# Patient Record
Sex: Female | Born: 1945 | Race: White | Hispanic: No | Marital: Married | Smoking: Never smoker
Health system: Southern US, Community
[De-identification: ages and names within clinical notes are randomized; demographics above are authoritative.]

## PROBLEM LIST (undated history)

## (undated) DIAGNOSIS — Z9989 Dependence on other enabling machines and devices: Secondary | ICD-10-CM

## (undated) DIAGNOSIS — I499 Cardiac arrhythmia, unspecified: Secondary | ICD-10-CM

## (undated) DIAGNOSIS — E119 Type 2 diabetes mellitus without complications: Secondary | ICD-10-CM

## (undated) DIAGNOSIS — K5730 Diverticulosis of large intestine without perforation or abscess without bleeding: Secondary | ICD-10-CM

## (undated) DIAGNOSIS — I10 Essential (primary) hypertension: Secondary | ICD-10-CM

## (undated) DIAGNOSIS — M719 Bursopathy, unspecified: Secondary | ICD-10-CM

## (undated) DIAGNOSIS — N39498 Other specified urinary incontinence: Secondary | ICD-10-CM

## (undated) DIAGNOSIS — E039 Hypothyroidism, unspecified: Secondary | ICD-10-CM

## (undated) DIAGNOSIS — R350 Frequency of micturition: Secondary | ICD-10-CM

## (undated) DIAGNOSIS — I5189 Other ill-defined heart diseases: Secondary | ICD-10-CM

## (undated) DIAGNOSIS — K644 Residual hemorrhoidal skin tags: Secondary | ICD-10-CM

## (undated) DIAGNOSIS — K219 Gastro-esophageal reflux disease without esophagitis: Secondary | ICD-10-CM

## (undated) DIAGNOSIS — E538 Deficiency of other specified B group vitamins: Secondary | ICD-10-CM

## (undated) DIAGNOSIS — I4891 Unspecified atrial fibrillation: Secondary | ICD-10-CM

## (undated) DIAGNOSIS — D126 Benign neoplasm of colon, unspecified: Secondary | ICD-10-CM

## (undated) DIAGNOSIS — I340 Nonrheumatic mitral (valve) insufficiency: Secondary | ICD-10-CM

## (undated) DIAGNOSIS — F3289 Other specified depressive episodes: Secondary | ICD-10-CM

## (undated) DIAGNOSIS — M1990 Unspecified osteoarthritis, unspecified site: Secondary | ICD-10-CM

## (undated) DIAGNOSIS — Z8719 Personal history of other diseases of the digestive system: Secondary | ICD-10-CM

## (undated) DIAGNOSIS — J45909 Unspecified asthma, uncomplicated: Secondary | ICD-10-CM

## (undated) DIAGNOSIS — M67919 Unspecified disorder of synovium and tendon, unspecified shoulder: Secondary | ICD-10-CM

## (undated) DIAGNOSIS — E669 Obesity, unspecified: Secondary | ICD-10-CM

## (undated) DIAGNOSIS — F329 Major depressive disorder, single episode, unspecified: Secondary | ICD-10-CM

## (undated) DIAGNOSIS — G4733 Obstructive sleep apnea (adult) (pediatric): Secondary | ICD-10-CM

## (undated) DIAGNOSIS — R079 Chest pain, unspecified: Secondary | ICD-10-CM

## (undated) DIAGNOSIS — R3915 Urgency of urination: Secondary | ICD-10-CM

## (undated) DIAGNOSIS — I4819 Other persistent atrial fibrillation: Secondary | ICD-10-CM

## (undated) DIAGNOSIS — E7800 Pure hypercholesterolemia, unspecified: Secondary | ICD-10-CM

## (undated) DIAGNOSIS — K449 Diaphragmatic hernia without obstruction or gangrene: Secondary | ICD-10-CM

## (undated) DIAGNOSIS — K5900 Constipation, unspecified: Secondary | ICD-10-CM

## (undated) DIAGNOSIS — D649 Anemia, unspecified: Secondary | ICD-10-CM

## (undated) DIAGNOSIS — F419 Anxiety disorder, unspecified: Secondary | ICD-10-CM

## (undated) DIAGNOSIS — D131 Benign neoplasm of stomach: Secondary | ICD-10-CM

## (undated) DIAGNOSIS — K648 Other hemorrhoids: Secondary | ICD-10-CM

## (undated) DIAGNOSIS — K635 Polyp of colon: Secondary | ICD-10-CM

## (undated) DIAGNOSIS — J189 Pneumonia, unspecified organism: Secondary | ICD-10-CM

## (undated) HISTORY — DX: Polyp of colon: K63.5

## (undated) HISTORY — DX: Nonrheumatic mitral (valve) insufficiency: I34.0

## (undated) HISTORY — DX: Other specified urinary incontinence: N39.498

## (undated) HISTORY — DX: Other hemorrhoids: K64.8

## (undated) HISTORY — PX: TONSILLECTOMY: SUR1361

## (undated) HISTORY — DX: Diaphragmatic hernia without obstruction or gangrene: K44.9

## (undated) HISTORY — PX: CATARACT EXTRACTION W/ INTRAOCULAR LENS  IMPLANT, BILATERAL: SHX1307

## (undated) HISTORY — DX: Deficiency of other specified B group vitamins: E53.8

## (undated) HISTORY — DX: Unspecified disorder of synovium and tendon, unspecified shoulder: M67.919

## (undated) HISTORY — DX: Residual hemorrhoidal skin tags: K64.4

## (undated) HISTORY — DX: Obesity, unspecified: E66.9

## (undated) HISTORY — DX: Benign neoplasm of colon, unspecified: D12.6

## (undated) HISTORY — PX: OTHER SURGICAL HISTORY: SHX169

## (undated) HISTORY — DX: Diverticulosis of large intestine without perforation or abscess without bleeding: K57.30

## (undated) HISTORY — DX: Other specified depressive episodes: F32.89

## (undated) HISTORY — DX: Pure hypercholesterolemia, unspecified: E78.00

## (undated) HISTORY — DX: Other persistent atrial fibrillation: I48.19

## (undated) HISTORY — DX: Essential (primary) hypertension: I10

## (undated) HISTORY — DX: Other ill-defined heart diseases: I51.89

## (undated) HISTORY — DX: Unspecified disorder of synovium and tendon, unspecified shoulder: M71.9

## (undated) HISTORY — PX: CARDIAC CATHETERIZATION: SHX172

## (undated) HISTORY — DX: Hypothyroidism, unspecified: E03.9

## (undated) HISTORY — DX: Anemia, unspecified: D64.9

## (undated) HISTORY — DX: Constipation, unspecified: K59.00

## (undated) HISTORY — PX: REDUCTION MAMMAPLASTY: SUR839

## (undated) HISTORY — DX: Unspecified atrial fibrillation: I48.91

## (undated) HISTORY — DX: Major depressive disorder, single episode, unspecified: F32.9

## (undated) HISTORY — PX: ABDOMINAL HYSTERECTOMY: SHX81

## (undated) HISTORY — DX: Benign neoplasm of stomach: D13.1

## (undated) HISTORY — PX: COLONOSCOPY: SHX174

## (undated) HISTORY — PX: LAPAROSCOPIC CHOLECYSTECTOMY: SUR755

## (undated) HISTORY — DX: Chest pain, unspecified: R07.9

---
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
## 1970-03-04 HISTORY — PX: ABDOMINAL HYSTERECTOMY: SHX81

## 1997-12-19 ENCOUNTER — Ambulatory Visit (HOSPITAL_COMMUNITY): Admission: RE | Admit: 1997-12-19 | Discharge: 1997-12-19 | Payer: Self-pay | Admitting: *Deleted

## 1999-01-09 ENCOUNTER — Ambulatory Visit (HOSPITAL_COMMUNITY): Admission: RE | Admit: 1999-01-09 | Discharge: 1999-01-09 | Payer: Self-pay | Admitting: *Deleted

## 1999-01-09 ENCOUNTER — Encounter: Payer: Self-pay | Admitting: *Deleted

## 1999-09-20 ENCOUNTER — Other Ambulatory Visit: Admission: RE | Admit: 1999-09-20 | Discharge: 1999-09-20 | Payer: Self-pay | Admitting: *Deleted

## 2000-01-22 ENCOUNTER — Ambulatory Visit (HOSPITAL_COMMUNITY): Admission: RE | Admit: 2000-01-22 | Discharge: 2000-01-22 | Payer: Self-pay | Admitting: *Deleted

## 2000-01-22 ENCOUNTER — Encounter: Payer: Self-pay | Admitting: *Deleted

## 2001-01-22 ENCOUNTER — Ambulatory Visit (HOSPITAL_COMMUNITY): Admission: RE | Admit: 2001-01-22 | Discharge: 2001-01-22 | Payer: Self-pay | Admitting: *Deleted

## 2001-01-22 ENCOUNTER — Encounter: Payer: Self-pay | Admitting: *Deleted

## 2001-07-14 ENCOUNTER — Emergency Department (HOSPITAL_COMMUNITY): Admission: EM | Admit: 2001-07-14 | Discharge: 2001-07-14 | Payer: Self-pay | Admitting: Emergency Medicine

## 2001-07-14 ENCOUNTER — Encounter: Payer: Self-pay | Admitting: Emergency Medicine

## 2002-04-13 ENCOUNTER — Encounter: Payer: Self-pay | Admitting: Family Medicine

## 2002-04-13 ENCOUNTER — Ambulatory Visit (HOSPITAL_COMMUNITY): Admission: RE | Admit: 2002-04-13 | Discharge: 2002-04-13 | Payer: Self-pay | Admitting: Family Medicine

## 2002-05-05 ENCOUNTER — Other Ambulatory Visit: Admission: RE | Admit: 2002-05-05 | Discharge: 2002-05-05 | Payer: Self-pay | Admitting: Obstetrics & Gynecology

## 2003-03-05 LAB — HM COLONOSCOPY

## 2003-06-10 ENCOUNTER — Ambulatory Visit (HOSPITAL_COMMUNITY): Admission: RE | Admit: 2003-06-10 | Discharge: 2003-06-10 | Payer: Self-pay | Admitting: Family Medicine

## 2004-07-24 ENCOUNTER — Ambulatory Visit (HOSPITAL_COMMUNITY): Admission: RE | Admit: 2004-07-24 | Discharge: 2004-07-24 | Payer: Self-pay | Admitting: Gynecology

## 2005-02-13 ENCOUNTER — Ambulatory Visit: Payer: Self-pay

## 2005-10-02 ENCOUNTER — Encounter: Payer: Self-pay | Admitting: Family Medicine

## 2005-10-02 LAB — CONVERTED CEMR LAB: Hgb A1c MFr Bld: 7.6 %

## 2005-10-08 ENCOUNTER — Ambulatory Visit (HOSPITAL_COMMUNITY): Admission: RE | Admit: 2005-10-08 | Discharge: 2005-10-08 | Payer: Self-pay | Admitting: Gynecology

## 2006-02-07 ENCOUNTER — Ambulatory Visit: Payer: Self-pay | Admitting: Gynecology

## 2006-03-04 ENCOUNTER — Encounter: Payer: Self-pay | Admitting: Family Medicine

## 2006-03-06 ENCOUNTER — Ambulatory Visit: Payer: Self-pay | Admitting: Obstetrics & Gynecology

## 2006-03-12 ENCOUNTER — Ambulatory Visit: Payer: Self-pay | Admitting: Family Medicine

## 2006-03-12 ENCOUNTER — Ambulatory Visit (HOSPITAL_COMMUNITY): Admission: RE | Admit: 2006-03-12 | Discharge: 2006-03-12 | Payer: Self-pay | Admitting: Gynecology

## 2006-03-24 ENCOUNTER — Ambulatory Visit: Payer: Self-pay | Admitting: Family Medicine

## 2006-03-27 ENCOUNTER — Ambulatory Visit: Payer: Self-pay | Admitting: Obstetrics & Gynecology

## 2006-04-04 ENCOUNTER — Encounter: Payer: Self-pay | Admitting: Family Medicine

## 2006-04-04 LAB — CONVERTED CEMR LAB: Hgb A1c MFr Bld: 9.1 %

## 2006-05-01 ENCOUNTER — Ambulatory Visit: Payer: Self-pay | Admitting: Family Medicine

## 2006-05-01 LAB — CONVERTED CEMR LAB
Bilirubin, Direct: 0.1 mg/dL (ref 0.0–0.3)
Chloride: 102 meq/L (ref 96–112)
Creatinine,U: 138.2 mg/dL
LDL Cholesterol: 136 mg/dL — ABNORMAL HIGH (ref 0–99)
Microalb Creat Ratio: 8 mg/g (ref 0.0–30.0)
Microalb, Ur: 1.1 mg/dL (ref 0.0–1.9)
Sodium: 137 meq/L (ref 135–145)
Total Bilirubin: 0.5 mg/dL (ref 0.3–1.2)
Total CHOL/HDL Ratio: 4.4
Total Protein: 6.7 g/dL (ref 6.0–8.3)
VLDL: 14 mg/dL (ref 0–40)

## 2006-05-06 ENCOUNTER — Ambulatory Visit: Payer: Self-pay | Admitting: Family Medicine

## 2006-06-07 ENCOUNTER — Ambulatory Visit: Payer: Self-pay | Admitting: Family Medicine

## 2006-06-11 ENCOUNTER — Encounter: Payer: Self-pay | Admitting: Family Medicine

## 2006-06-11 DIAGNOSIS — N39498 Other specified urinary incontinence: Secondary | ICD-10-CM | POA: Insufficient documentation

## 2006-06-11 DIAGNOSIS — K5730 Diverticulosis of large intestine without perforation or abscess without bleeding: Secondary | ICD-10-CM | POA: Insufficient documentation

## 2006-06-11 DIAGNOSIS — K648 Other hemorrhoids: Secondary | ICD-10-CM | POA: Insufficient documentation

## 2006-06-11 DIAGNOSIS — F325 Major depressive disorder, single episode, in full remission: Secondary | ICD-10-CM | POA: Insufficient documentation

## 2006-06-11 DIAGNOSIS — E7800 Pure hypercholesterolemia, unspecified: Secondary | ICD-10-CM | POA: Insufficient documentation

## 2006-06-11 DIAGNOSIS — F330 Major depressive disorder, recurrent, mild: Secondary | ICD-10-CM | POA: Insufficient documentation

## 2006-06-11 DIAGNOSIS — E1169 Type 2 diabetes mellitus with other specified complication: Secondary | ICD-10-CM | POA: Insufficient documentation

## 2006-08-04 ENCOUNTER — Ambulatory Visit: Payer: Self-pay | Admitting: Family Medicine

## 2006-08-05 LAB — CONVERTED CEMR LAB
AST: 21 units/L (ref 0–37)
Cholesterol: 227 mg/dL (ref 0–200)
Direct LDL: 155.4 mg/dL
HDL: 48.5 mg/dL (ref >39.0)
VLDL: 15 mg/dL (ref 0–40)

## 2006-10-31 ENCOUNTER — Ambulatory Visit: Payer: Self-pay | Admitting: Gynecology

## 2006-12-05 ENCOUNTER — Ambulatory Visit (HOSPITAL_COMMUNITY): Admission: RE | Admit: 2006-12-05 | Discharge: 2006-12-05 | Payer: Self-pay | Admitting: Gynecology

## 2006-12-09 ENCOUNTER — Ambulatory Visit: Payer: Self-pay | Admitting: Family Medicine

## 2006-12-09 LAB — CONVERTED CEMR LAB
Chloride: 105 meq/L (ref 96–112)
Cholesterol: 215 mg/dL (ref 0–200)
HDL: 43.2 mg/dL (ref >39.0)
Hgb A1c MFr Bld: 7.5 % — ABNORMAL HIGH (ref 4.6–6.0)
VLDL: 9 mg/dL (ref 0–40)

## 2006-12-29 ENCOUNTER — Ambulatory Visit: Payer: Self-pay | Admitting: Family Medicine

## 2006-12-30 ENCOUNTER — Ambulatory Visit: Payer: Self-pay | Admitting: Cardiology

## 2007-03-09 ENCOUNTER — Ambulatory Visit: Payer: Self-pay | Admitting: Family Medicine

## 2007-03-11 ENCOUNTER — Ambulatory Visit: Payer: Self-pay | Admitting: Family Medicine

## 2007-03-11 LAB — CONVERTED CEMR LAB
Cholesterol, target level: 200 mg/dL
HDL goal, serum: 40 mg/dL
LDL Goal: 100 mg/dL

## 2007-03-12 LAB — CONVERTED CEMR LAB
ALT: 24 units/L (ref 0–35)
AST: 21 units/L (ref 0–37)
HDL: 49 mg/dL (ref >39)

## 2007-04-01 ENCOUNTER — Encounter: Payer: Self-pay | Admitting: Family Medicine

## 2007-04-01 ENCOUNTER — Telehealth (INDEPENDENT_AMBULATORY_CARE_PROVIDER_SITE_OTHER): Payer: Self-pay | Admitting: *Deleted

## 2007-04-21 ENCOUNTER — Ambulatory Visit: Payer: Self-pay | Admitting: Family Medicine

## 2007-06-08 ENCOUNTER — Ambulatory Visit: Payer: Self-pay | Admitting: Family Medicine

## 2007-06-17 LAB — CONVERTED CEMR LAB
Cholesterol: 195 mg/dL (ref 0–200)
HDL: 52 mg/dL (ref >39)
Hgb A1c MFr Bld: 7.6 % — ABNORMAL HIGH (ref 4.6–6.1)
LDL Cholesterol: 128 mg/dL — ABNORMAL HIGH (ref 0–99)
Total CHOL/HDL Ratio: 3.8
Triglycerides: 76 mg/dL (ref <150)

## 2007-06-23 ENCOUNTER — Ambulatory Visit: Payer: Self-pay | Admitting: Family Medicine

## 2007-07-16 ENCOUNTER — Ambulatory Visit: Payer: Self-pay | Admitting: Gynecology

## 2007-09-18 ENCOUNTER — Ambulatory Visit: Payer: Self-pay | Admitting: Family Medicine

## 2007-09-23 ENCOUNTER — Ambulatory Visit: Payer: Self-pay | Admitting: Family Medicine

## 2007-09-23 LAB — CONVERTED CEMR LAB
ALT: 18 units/L (ref 0–35)
AST: 17 units/L (ref 0–37)
Albumin: 3.8 g/dL (ref 3.5–5.2)
Alkaline Phosphatase: 61 units/L (ref 39–117)
Bilirubin, Direct: <0.1 mg/dL (ref 0.0–0.3)
CO2: 25 meq/L (ref 19–32)
Chloride: 106 meq/L (ref 96–112)
Cholesterol: 185 mg/dL (ref 0–200)
Total Protein: 6.5 g/dL (ref 6.0–8.3)
Triglycerides: 85 mg/dL (ref <150)

## 2007-10-16 ENCOUNTER — Encounter: Payer: Self-pay | Admitting: Family Medicine

## 2007-10-22 ENCOUNTER — Telehealth: Payer: Self-pay | Admitting: Family Medicine

## 2007-10-26 ENCOUNTER — Telehealth: Payer: Self-pay | Admitting: Family Medicine

## 2007-10-30 ENCOUNTER — Encounter: Payer: Self-pay | Admitting: Family Medicine

## 2007-12-16 ENCOUNTER — Encounter: Payer: Self-pay | Admitting: Family Medicine

## 2007-12-22 ENCOUNTER — Ambulatory Visit: Payer: Self-pay | Admitting: Family Medicine

## 2007-12-24 LAB — CONVERTED CEMR LAB
Cholesterol: 198 mg/dL (ref 0–200)
HDL: 42.5 mg/dL (ref >39.0)
LDL Cholesterol: 136 mg/dL — ABNORMAL HIGH (ref 0–99)
VLDL: 19 mg/dL (ref 0–40)

## 2007-12-25 ENCOUNTER — Ambulatory Visit (HOSPITAL_COMMUNITY): Admission: RE | Admit: 2007-12-25 | Discharge: 2007-12-25 | Payer: Self-pay | Admitting: Gynecology

## 2007-12-28 ENCOUNTER — Ambulatory Visit: Payer: Self-pay | Admitting: Family Medicine

## 2008-02-19 ENCOUNTER — Encounter: Payer: Self-pay | Admitting: Family Medicine

## 2008-02-23 ENCOUNTER — Encounter: Payer: Self-pay | Admitting: Family Medicine

## 2008-03-24 ENCOUNTER — Encounter: Payer: Self-pay | Admitting: Family Medicine

## 2008-04-12 ENCOUNTER — Ambulatory Visit: Payer: Self-pay | Admitting: Family Medicine

## 2008-04-14 ENCOUNTER — Ambulatory Visit: Payer: Self-pay | Admitting: Family Medicine

## 2008-04-28 ENCOUNTER — Ambulatory Visit: Payer: Self-pay | Admitting: Family Medicine

## 2008-04-28 ENCOUNTER — Encounter (INDEPENDENT_AMBULATORY_CARE_PROVIDER_SITE_OTHER): Payer: Self-pay | Admitting: *Deleted

## 2008-05-03 LAB — CONVERTED CEMR LAB
ALT: 14 units/L (ref 0–35)
LDL Cholesterol: 73 mg/dL (ref 0–99)
Total CHOL/HDL Ratio: 2.5
VLDL: 9 mg/dL (ref 0–40)

## 2008-06-27 ENCOUNTER — Encounter (INDEPENDENT_AMBULATORY_CARE_PROVIDER_SITE_OTHER): Payer: Self-pay | Admitting: *Deleted

## 2008-07-06 ENCOUNTER — Ambulatory Visit: Payer: Self-pay | Admitting: Family Medicine

## 2008-07-22 ENCOUNTER — Ambulatory Visit: Payer: Self-pay | Admitting: Family Medicine

## 2008-08-19 ENCOUNTER — Telehealth: Payer: Self-pay | Admitting: Family Medicine

## 2008-10-19 ENCOUNTER — Ambulatory Visit: Payer: Self-pay | Admitting: Family Medicine

## 2008-10-19 LAB — CONVERTED CEMR LAB
BUN: 17 mg/dL (ref 6–23)
Bilirubin, Direct: 0.1 mg/dL (ref 0.0–0.3)
CO2: 32 meq/L (ref 19–32)
Calcium: 8.9 mg/dL (ref 8.4–10.5)
Chloride: 111 meq/L (ref 96–112)
Cholesterol: 166 mg/dL (ref 0–200)
Creatinine, Ser: 0.8 mg/dL (ref 0.4–1.2)
GFR calc non Af Amer: 77.36 mL/min (ref >60)
HDL: 45.6 mg/dL (ref >39.00)
Hgb A1c MFr Bld: 7 % — ABNORMAL HIGH (ref 4.6–6.5)
Potassium: 4.6 meq/L (ref 3.5–5.1)
Total Protein: 6.6 g/dL (ref 6.0–8.3)
Triglycerides: 56 mg/dL (ref 0.0–149.0)
VLDL: 11.2 mg/dL (ref 0.0–40.0)

## 2008-10-25 ENCOUNTER — Ambulatory Visit: Payer: Self-pay | Admitting: Family Medicine

## 2008-10-25 DIAGNOSIS — G4730 Sleep apnea, unspecified: Secondary | ICD-10-CM

## 2008-12-02 LAB — HM DIABETES EYE EXAM: HM Diabetic Eye Exam: NORMAL

## 2008-12-07 ENCOUNTER — Encounter: Admission: RE | Admit: 2008-12-07 | Discharge: 2008-12-07 | Payer: Self-pay | Admitting: Cardiology

## 2008-12-07 ENCOUNTER — Encounter: Payer: Self-pay | Admitting: Family Medicine

## 2008-12-13 ENCOUNTER — Ambulatory Visit (HOSPITAL_COMMUNITY): Admission: RE | Admit: 2008-12-13 | Discharge: 2008-12-13 | Payer: Self-pay | Admitting: Cardiology

## 2008-12-14 ENCOUNTER — Encounter: Payer: Self-pay | Admitting: Family Medicine

## 2009-01-06 ENCOUNTER — Ambulatory Visit (HOSPITAL_COMMUNITY): Admission: RE | Admit: 2009-01-06 | Discharge: 2009-01-06 | Payer: Self-pay | Admitting: Family Medicine

## 2009-01-18 ENCOUNTER — Ambulatory Visit: Payer: Self-pay | Admitting: Family Medicine

## 2009-01-30 ENCOUNTER — Ambulatory Visit: Payer: Self-pay | Admitting: Family Medicine

## 2009-01-31 LAB — CONVERTED CEMR LAB
ALT: 14 units/L (ref 0–35)
AST: 15 units/L (ref 0–37)
Albumin: 3.7 g/dL (ref 3.5–5.2)
Alkaline Phosphatase: 48 units/L (ref 39–117)
CO2: 32 meq/L (ref 19–32)
Creatinine, Ser: 0.8 mg/dL (ref 0.4–1.2)
HDL: 43.4 mg/dL (ref >39.00)
LDL Cholesterol: 97 mg/dL (ref 0–99)
Potassium: 4.7 meq/L (ref 3.5–5.1)
Total CHOL/HDL Ratio: 4
Total Protein: 6.7 g/dL (ref 6.0–8.3)
Triglycerides: 74 mg/dL (ref 0.0–149.0)
VLDL: 14.8 mg/dL (ref 0.0–40.0)

## 2009-02-03 ENCOUNTER — Ambulatory Visit: Payer: Self-pay | Admitting: Family Medicine

## 2009-02-13 ENCOUNTER — Ambulatory Visit: Payer: Self-pay | Admitting: Family Medicine

## 2009-02-13 ENCOUNTER — Telehealth: Payer: Self-pay | Admitting: Family Medicine

## 2009-04-19 ENCOUNTER — Ambulatory Visit: Payer: Self-pay | Admitting: Family Medicine

## 2009-04-20 LAB — CONVERTED CEMR LAB
ALT: 13 units/L (ref 0–35)
Alkaline Phosphatase: 52 units/L (ref 39–117)
Calcium: 9.4 mg/dL (ref 8.4–10.5)
Cholesterol: 169 mg/dL (ref 0–200)
Glucose, Bld: 127 mg/dL — ABNORMAL HIGH (ref 70–99)
HDL: 44 mg/dL (ref >39)
Indirect Bilirubin: 0.2 mg/dL (ref 0.0–0.9)
Potassium: 4.7 meq/L (ref 3.5–5.3)
Total CHOL/HDL Ratio: 3.8
VLDL: 16 mg/dL (ref 0–40)

## 2009-04-28 ENCOUNTER — Ambulatory Visit: Payer: Self-pay | Admitting: Family Medicine

## 2009-05-02 LAB — CONVERTED CEMR LAB
Pap Smear: NORMAL
Pap Smear: NORMAL

## 2009-05-16 ENCOUNTER — Encounter: Payer: Self-pay | Admitting: Family Medicine

## 2009-06-08 ENCOUNTER — Ambulatory Visit: Payer: Self-pay | Admitting: Obstetrics & Gynecology

## 2009-06-20 ENCOUNTER — Encounter: Payer: Self-pay | Admitting: Family Medicine

## 2009-07-10 ENCOUNTER — Ambulatory Visit: Payer: Self-pay | Admitting: Family Medicine

## 2009-07-13 LAB — CONVERTED CEMR LAB
HDL: 42 mg/dL (ref >39)
Hgb A1c MFr Bld: 6.8 % — ABNORMAL HIGH (ref <5.7)
VLDL: 13 mg/dL (ref 0–40)

## 2009-07-14 ENCOUNTER — Ambulatory Visit: Payer: Self-pay | Admitting: Family Medicine

## 2009-08-25 ENCOUNTER — Encounter: Payer: Self-pay | Admitting: Family Medicine

## 2009-09-06 ENCOUNTER — Encounter (INDEPENDENT_AMBULATORY_CARE_PROVIDER_SITE_OTHER): Payer: Self-pay | Admitting: *Deleted

## 2009-09-06 LAB — CONVERTED CEMR LAB
Cholesterol: 153 mg/dL
Creatinine, Ser: 0.9 mg/dL
HDL: 42 mg/dL
Hgb A1c MFr Bld: 6.8 %

## 2009-09-26 ENCOUNTER — Ambulatory Visit: Payer: Self-pay | Admitting: Family Medicine

## 2009-09-29 LAB — CONVERTED CEMR LAB
ALT: 13 units/L (ref 0–35)
AST: 14 units/L (ref 0–37)
Alkaline Phosphatase: 60 units/L (ref 39–117)
BUN: 14 mg/dL (ref 6–23)
CO2: 26 meq/L (ref 19–32)
Chloride: 104 meq/L (ref 96–112)
Creatinine, Ser: 0.97 mg/dL (ref 0.40–1.20)
Creatinine, Urine: 156.2 mg/dL
Hgb A1c MFr Bld: 7.1 % — ABNORMAL HIGH (ref <5.7)
Potassium: 4.8 meq/L (ref 3.5–5.3)
Total Bilirubin: 0.4 mg/dL (ref 0.3–1.2)
Total Protein: 6.7 g/dL (ref 6.0–8.3)

## 2009-10-02 ENCOUNTER — Ambulatory Visit: Payer: Self-pay | Admitting: Family Medicine

## 2009-11-28 ENCOUNTER — Ambulatory Visit: Payer: Self-pay | Admitting: Family Medicine

## 2009-12-05 ENCOUNTER — Ambulatory Visit: Payer: Self-pay | Admitting: Family Medicine

## 2009-12-06 LAB — CONVERTED CEMR LAB
ALT: 18 units/L (ref 0–35)
Alkaline Phosphatase: 65 units/L (ref 39–117)
CO2: 28 meq/L (ref 19–32)
Calcium: 9.7 mg/dL (ref 8.4–10.5)
Chloride: 105 meq/L (ref 96–112)
HDL: 45.3 mg/dL (ref >39.00)
Hgb A1c MFr Bld: 7.7 % — ABNORMAL HIGH (ref 4.6–6.5)
Sodium: 140 meq/L (ref 135–145)
Total Bilirubin: 0.3 mg/dL (ref 0.3–1.2)
Total CHOL/HDL Ratio: 4
Total Protein: 6.7 g/dL (ref 6.0–8.3)
VLDL: 18.6 mg/dL (ref 0.0–40.0)

## 2009-12-12 ENCOUNTER — Ambulatory Visit: Payer: Self-pay | Admitting: Family Medicine

## 2009-12-13 LAB — CONVERTED CEMR LAB
BUN: 16 mg/dL (ref 6–23)
Calcium: 10 mg/dL (ref 8.4–10.5)
Chloride: 103 meq/L (ref 96–112)
Creatinine, Ser: 0.9 mg/dL (ref 0.40–1.20)
Glucose, Bld: 180 mg/dL — ABNORMAL HIGH (ref 70–99)
Sodium: 139 meq/L (ref 135–145)

## 2010-01-09 ENCOUNTER — Encounter (INDEPENDENT_AMBULATORY_CARE_PROVIDER_SITE_OTHER): Payer: Self-pay | Admitting: *Deleted

## 2010-01-09 ENCOUNTER — Ambulatory Visit: Payer: Self-pay | Admitting: Family Medicine

## 2010-01-09 LAB — CONVERTED CEMR LAB: Glucose, Bld: 125 mg/dL

## 2010-01-12 ENCOUNTER — Ambulatory Visit: Payer: Self-pay | Admitting: Internal Medicine

## 2010-01-12 LAB — CONVERTED CEMR LAB
Specific Gravity, Urine: 1.025
pH: 5

## 2010-01-13 ENCOUNTER — Encounter: Payer: Self-pay | Admitting: Family Medicine

## 2010-01-17 ENCOUNTER — Encounter (INDEPENDENT_AMBULATORY_CARE_PROVIDER_SITE_OTHER): Payer: Self-pay | Admitting: *Deleted

## 2010-01-23 ENCOUNTER — Encounter (INDEPENDENT_AMBULATORY_CARE_PROVIDER_SITE_OTHER): Payer: Self-pay | Admitting: *Deleted

## 2010-01-25 ENCOUNTER — Encounter (INDEPENDENT_AMBULATORY_CARE_PROVIDER_SITE_OTHER): Payer: Self-pay | Admitting: *Deleted

## 2010-01-28 ENCOUNTER — Encounter (INDEPENDENT_AMBULATORY_CARE_PROVIDER_SITE_OTHER): Payer: Self-pay | Admitting: *Deleted

## 2010-02-05 ENCOUNTER — Encounter (INDEPENDENT_AMBULATORY_CARE_PROVIDER_SITE_OTHER): Payer: Self-pay | Admitting: *Deleted

## 2010-02-08 ENCOUNTER — Ambulatory Visit (HOSPITAL_COMMUNITY)
Admission: RE | Admit: 2010-02-08 | Discharge: 2010-02-08 | Payer: Self-pay | Source: Home / Self Care | Attending: Family Medicine | Admitting: Family Medicine

## 2010-02-08 LAB — HM MAMMOGRAPHY: HM Mammogram: NEGATIVE

## 2010-02-12 ENCOUNTER — Ambulatory Visit: Payer: Self-pay | Admitting: Internal Medicine

## 2010-02-12 LAB — CONVERTED CEMR LAB
Bilirubin Urine: NEGATIVE
Glucose, Urine, Semiquant: NEGATIVE
Specific Gravity, Urine: 1.025
pH: 6

## 2010-02-13 ENCOUNTER — Encounter: Payer: Self-pay | Admitting: Family Medicine

## 2010-02-27 ENCOUNTER — Encounter: Payer: Self-pay | Admitting: Family Medicine

## 2010-03-19 ENCOUNTER — Encounter: Payer: Self-pay | Admitting: Family Medicine

## 2010-03-26 ENCOUNTER — Encounter: Payer: Self-pay | Admitting: Family Medicine

## 2010-04-02 ENCOUNTER — Ambulatory Visit
Admission: RE | Admit: 2010-04-02 | Discharge: 2010-04-02 | Payer: Self-pay | Source: Home / Self Care | Attending: Family Medicine | Admitting: Family Medicine

## 2010-04-02 ENCOUNTER — Encounter: Payer: Self-pay | Admitting: Family Medicine

## 2010-04-03 LAB — CONVERTED CEMR LAB
AST: 14 units/L (ref 0–37)
Alkaline Phosphatase: 59 units/L (ref 39–117)
CO2: 27 meq/L (ref 19–32)
Chloride: 100 meq/L (ref 96–112)
HDL: 38 mg/dL — ABNORMAL LOW (ref >39)
LDL Cholesterol: 150 mg/dL — ABNORMAL HIGH (ref 0–99)
Sodium: 135 meq/L (ref 135–145)
Total Bilirubin: 0.4 mg/dL (ref 0.3–1.2)
Total CHOL/HDL Ratio: 5.3
Triglycerides: 76 mg/dL (ref <150)

## 2010-04-05 NOTE — Progress Notes (Signed)
Summary: List of Blood Sugars taken at home  List of Blood Sugars taken at home   Imported By: Maryln Gottron 03/13/2010 14:21:40  _____________________________________________________________________  External Attachment:    Type:   Image     Comment:   External Document

## 2010-04-05 NOTE — Letter (Signed)
Summary: [facility]   Imported By: Lanelle Bal 07/19/2009 12:43:38  _____________________________________________________________________  External Attachment:    Type:   Image     Comment:   External Document

## 2010-04-05 NOTE — Assessment & Plan Note (Signed)
Summary: ?UTI/CLE   Vital Signs:  Patient profile:   65 year old female Weight:      212.75 pounds Temp:     98.1 degrees F oral Pulse rate:   76 / minute Pulse rhythm:   regular BP sitting:   110 / 70  (left arm) Cuff size:   large  Vitals Entered By: Selena Batten Dance CMA Duncan Dull) (February 12, 2010 9:21 AM) CC: ? UTI   History of Present Illness: CC: ?UTI  3d h/o UTI sxs.  Dysuria, frequency, urgency.  Achiness.  + Chills.  + abd and back pain, mainly abd pain.    Slept well last night.  Taking advil and plenty of water.  No fevers.  1 mo ago treated for UTI >100k Ecoli with 7 d course cipro.  completed course and resolved.  Current Medications (verified): 1)  Zetia 10 Mg  Tabs (Ezetimibe) .... Take 1 Tab By Mouth At Bedtime 2)  Lexapro 10 Mg  Tabs (Escitalopram Oxalate) .... Take 1 Tablet By Mouth Two Times A Day 3)  Calcium 500/d 500-200 Mg-Unit  Tabs (Calcium Carbonate-Vitamin D) .... Take 1 Tablet By Mouth Once A Day 4)  Bayer Low Strength 81 Mg  Tbec (Aspirin) .... Take 1 Tablet By Mouth Once A Day 5)  Zantac 150 Maximum Strength 150 Mg Tabs (Ranitidine Hcl) .... Take 1 By Mouth Once A Day 6)  Metformin Hcl 500 Mg Tabs (Metformin Hcl) .... Take 2 Tablets  By Mouth Once Daily 7)  Multivitamins   Tabs (Multiple Vitamin) .... Take 1 Tablet By Mouth Once A Day 8)  Accu-Chek Active   Strp (Glucose Blood) .... Test Blood Sugar Two Times A Day 9)  Januvia 100 Mg Tabs (Sitagliptin Phosphate) .... One At Bedtime 10)  Pravachol 20 Mg Tabs (Pravastatin Sodium) .[name].. 1 Tab By Mouth Daily 11)  Lisinopril 10 Mg Tabs (Lisinopril) .... Take 1 Tablet By Mouth Once A Day 12)  Alprazolam 0.25 Mg Tabs (Alprazolam) .[name].. 1 Tab By Mouth Daily As Needed Anxiety 13)  Lantus Solostar 100 Unit/ml Soln (Insulin Glargine) .[name].. 12 Units Daily.Marland Kitchentitrate Up Insulin Every 2 Days Until Fasting Blood Sugars Are <120 14)  Pen Needles For Solostar .... Size of Choice  Administer Daily  Dx 250.00  Allergies: 1)  !  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 FAST FOOD, SALADS, LEAN MEAT Never Smoked Alcohol use-no Drug use-no  Review of Systems       per HPI  Physical Exam  General:  Overwewight female in NAD Lungs:  Normal respiratory effort, chest expands symmetrically. Lungs are clear to auscultation, no crackles or wheezes. Heart:  Normal rate and regular rhythm. S1 and S2 normal without gallop, murmur, click, rub or other extra sounds. Abdomen:  Bowel sounds positive,abdomen soft and without masses, organomegaly or hernias noted.  very mild CVA tenderness.  + lower back pain.  + bilateral lower quadrant pressure, + suprapubic discomfort/pain. Pulses:  2+ rad pulses Extremities:  no pedal edema   Impression & Recommendations:  Problem # 1:  ACUTE CYSTITIS (ICD-595.0)  complicatd by recent abx course.  treat with 7 day course cipro twice daily.  red flags to return discussed.  Ucx sent.  The following medications were removed from the medication list:    Detrol La 4 Mg Xr24h-cap (Tolterodine tartrate) .[name]... Take 1 tablet by mouth once a day    Ciprofloxacin Hcl 500 Mg Tabs (Ciprofloxacin hcl) .[name]... Take one twice daily x 7 days Her updated medication list for this problem includes:    Ciprofloxacin Hcl 500 Mg Tabs (Ciprofloxacin hcl) .[name]... Take one twice daily for 7 days  Orders: UA Dipstick W/  Micro (manual) (78295) Specimen Handling (99000) T-Culture, Urine ([id number])  Complete Medication List: 1)  Zetia 10 Mg Tabs (Ezetimibe) .... Take 1 tab by mouth at bedtime 2)  Lexapro 10 Mg Tabs (Escitalopram oxalate) .... Take 1 tablet by mouth two times a day 3)  Calcium 500/d 500-200 Mg-unit Tabs (Calcium carbonate-vitamin d) .... Take 1 tablet by mouth once a day 4)  Bayer Low Strength 81 Mg Tbec (Aspirin) .... Take 1 tablet by mouth once a day 5)  Zantac 150 Maximum Strength 150 Mg Tabs (Ranitidine hcl) .... Take 1 by mouth once a day 6)  Metformin Hcl 500 Mg Tabs (Metformin hcl) .... Take 2 tablets  by mouth once daily 7)  Multivitamins Tabs (Multiple vitamin) .... Take 1 tablet by mouth once a day 8)  Accu-chek Active Strp (Glucose blood) .... Test blood sugar two times a day 9)  Januvia 100 Mg Tabs (Sitagliptin phosphate) .... One at bedtime 10)  Pravachol 20 Mg Tabs (Pravastatin sodium) .[name].. 1 tab by mouth daily 11)  Lisinopril 10 Mg Tabs (Lisinopril) .... Take 1 tablet by mouth once a day 12)  Alprazolam 0.25 Mg Tabs (Alprazolam) .[name].. 1 tab by mouth daily as needed anxiety 13)  Lantus Solostar 100 Unit/ml Soln (Insulin glargine) .[name].. 12 units daily.Marland Kitchentitrate up insulin every 2 days until fasting blood sugars are <120 14)  Pen Needles For Solostar  .... Size of choice  administer daily  dx 250.00 15)  Ciprofloxacin Hcl 500 Mg Tabs (Ciprofloxacin hcl) .... Take one twice daily for 7 days  Patient Instructions: 1)  Looks like another infection. 2)  Treat with cipro x 7 days again, twice daily. 3)  Push fluids and plenty of rest.  Tylenol for pain as needed. 4)  Call clinic with questions. 5)  Let us know if not improving, or if any more fevers/chills, or nausea/vomiting. Prescriptions: CIPROFLOXACIN HCL 500 MG TABS (CIPROFLOXACIN HCL) take one twice daily for 7 days  #14 x 0   Entered and Authorized by:   Eustaquio Boyden  MD   Signed by:   Eustaquio Boyden  MD on 02/12/2010    Method used:   Electronically to        [facility]* (retail)       [address]       [place], [place]  [address]       Ph: [phone]       Fax: [phone]   RxID:   [id number]    Orders Added: 1)  Est. Patient Level III [64332] 2)  UA Dipstick W/ Micro (manual) [81000] 3)  Specimen Handling [99000] 4)  T-Culture, Urine [[id number]]    Current Allergies (reviewed today): ! PRAVACHOL * CORTISONE (HYDROCORTISONE)  Laboratory Results   Urine Tests  Date/Time Received: February 12, 2010 9:23 AM  Date/Time Reported: February 12, 2010 9:23 AM   Routine Urinalysis  Color: lt. yellow Appearance: Cloudy Glucose: negative   (Normal Range: Negative) Bilirubin: negative   (Normal Range: Negative) Ketone: negative   (Normal Range: Negative) Spec. Gravity: 1.025   (Normal Range: 1.003-1.035) Blood: large   (Normal Range: Negative) pH: 6.0   (Normal Range: 5.0-8.0) Protein: 100   (Normal Range: Negative) Urobilinogen: 0.2   (Normal Range: 0-1) Nitrite: positive   (Normal Range: Negative) Leukocyte Esterace: large   (Normal Range: Negative)  Urine Microscopic WBC/HPF: TNTC RBC/HPF: 3-5 Bacteria/HPF: 1+ rods Mucous/HPF: no Epithelial/HPF: rare Crystals/HPF: no Casts/LPF: no Yeast/HPF: no    Comments: read by ............Eustaquio Boyden  MD  February 12, 2010 9:40 AM  UCx sent

## 2010-04-05 NOTE — Letter (Signed)
Summary: [facility] - Endocrine Follow-up  [facility] - Endocrine Follow-up   Imported By: Maryln Gottron 09/06/2009 14:32:49  _____________________________________________________________________  External Attachment:    Type:   Image     Comment:   External Document  Appended Document: [facility] - Endocrine Follow-up Heather-please enter..lipids, Hg, TSH, creatinine, A1C and  AST/ALT  if present into EMR.  Thanks.   Appended Document: [facility] - Endocrine Follow-up Done.Consuello Masse CMA

## 2010-04-05 NOTE — Assessment & Plan Note (Signed)
Summary: 3 m f/u dlo   Vital Signs:  Patient profile:   65 year old female Height:      65.75 inches Weight:      204.0 pounds BMI:     33.30 Temp:     97.7 degrees F oral Pulse rate:   76 / minute Pulse rhythm:   regular BP sitting:   110 / 72  (left arm) Cuff size:   large  Vitals Entered By: Benny Lennert CMA Duncan Dull) (December 12, 2009 8:42 AM)  History of Present Illness: Chief complaint 3 month follow up   DM, worsened control: on metformin, januvia and glucotrol  CBGs at home: FBS 142-182,  2 hours after meals ..not checking Increase in stress...mild depression..but anxiety intermittant, jittrery, difficulty sleeping. Checking 1-2  times daily. Hs lost 3 lbs more pounds weight loss.  Continuing to exercise.   BP at goal <130/80 on lisinopril 10 mg daily.  High potassium.Marland Kitcheneating daily bannana peanut butter sandwiches..has stopped since 10/4  Diabetes Management History:      She is (or has been) enrolled in the "Diabetic Education Program".  She is checking home blood sugars.  She says that she is exercising.  Type of exercise includes: walking.  Duration of exercise is estimated to be 30 min.  She is doing this 2 times per week.    Lipid Management History:      Positive NCEP/ATP III risk factors include female age 2 years old or older and diabetes.  Negative NCEP/ATP III risk factors include non-tobacco-user status.        Her compliance with the TLC diet is fair.  Adjunctive measures started by the patient include fiber, omega-3 supplements, limit alcohol consumpton, and weight reduction.  She expresses no side effects from her lipid-lowering medication.  Comments include: On Zetia and pravachol 20.  The patient denies any symptoms to suggest myopathy or liver disease.     Problems Prior to Update: 1)  Muscle Strain, Right Buttock  (ICD-848.8) 2)  Sleep Apnea  (ICD-780.57) 3)  Stress Incontinence  (ICD-788.39) 4)  Diverticulosis, Colon  (ICD-562.10) 5)   Hemorrhoids, Internal  (ICD-455.0) 6)  Obesity, Bmi 38  (ICD-278.00) 7)  Depression  (ICD-311) 8)  Hiatal Hernia/gerd  (ICD-553.3) 9)  Hypercholesterolemia  (ICD-272.0) 10)  Dm X 10 Years  (ICD-250.00)  Current Medications (verified): 1)  Zetia 10 Mg  Tabs (Ezetimibe) .... Take 1 Tab By Mouth At Bedtime 2)  Lexapro 10 Mg  Tabs (Escitalopram Oxalate) .... Take 1 Tablet By Mouth Two Times A Day 3)  Calcium 500/d 500-200 Mg-Unit  Tabs (Calcium Carbonate-Vitamin D) .... Take 1 Tablet By Mouth Once A Day 4)  Bayer Low Strength 81 Mg  Tbec (Aspirin) .... Take 1 Tablet By Mouth Once A Day 5)  Zantac 150 Maximum Strength 150 Mg Tabs (Ranitidine Hcl) .... Take 1 By Mouth Once A Day 6)  Metformin Hcl 500 Mg Tabs (Metformin Hcl) .... Take 2 Tablets  By Mouth Once Daily 7)  Glucotrol Xl 10 Mg Tb24 (Glipizide) .... Take 1  Tablet By Mouth Once A Day 8)  Multivitamins   Tabs (Multiple Vitamin) .... Take 1 Tablet By Mouth Once A Day 9)  Accu-Chek Active   Strp (Glucose Blood) .... Test Blood Sugar Two Times A Day 10)  Detrol La 4 Mg Xr24h-Cap (Tolterodine Tartrate) .... Take 1 Tablet By Mouth Once A Day 11)  Januvia 100 Mg Tabs (Sitagliptin Phosphate) .... One At Bedtime 12)  Pravachol 20  Mg Tabs (Pravastatin Sodium) .[name].. 1 Tab By Mouth Daily 13)  Lisinopril 10 Mg Tabs (Lisinopril) .... Take 1 Tablet By Mouth Once A Day  Allergies: 1)  ! Pravachol 2)  * Cortisone (Hydrocortisone)  Past History:  Past medical, surgical, family and social histories (including risk factors) reviewed, and no changes noted (except as noted below).  Past Medical History: Reviewed history from 04/14/2008 and no changes required. Current Problems:  ROTATOR CUFF SYNDROME, LEFT (ICD-726.10) URI (ICD-465.9) HEADACHE (ICD-784.0) STRESS INCONTINENCE (ICD-788.39) DIVERTICULOSIS, COLON (ICD-562.10) HEMORRHOIDS, INTERNAL (ICD-455.0) OBESITY, BMI 38 (ICD-278.00) DEPRESSION (ICD-311) HIATAL HERNIA/GERD  (ICD-553.3) HYPERCHOLESTEROLEMIA (ICD-272.0) DM X 10 YEARS (ICD-250.00)    Past Surgical History: Reviewed history from 02/03/2009 and no changes required. 1989 TONSILLECTOMY 1972 HYSTERECTOMYpartial for MENORRHAGIA 1995 CHOLECYSTECTOMY 1997 BREAST REDUCTION 05/2006 RIGHT RENAL CYST, STABLE, NO F/U   2009 stress ECHO...low risk 2010 cardiac cath...no CAD  Family History: Reviewed history from 06/11/2006 and no changes required. Father: DIED 65 TOB ABUSE, LUNG CA Mother: ALIVE 68 FALLOPIAN TUBE CA, ALZHEIMER'S, PULMONARY EMBOLISM Siblings: 1 BROTHER DM               1 SISTER HTN PGM AND PGF:  TIA'S, CVA CV:  (-) MI <65  Social History: Reviewed history from 06/11/2006 and no changes required. 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 FAST FOOD, SALADS, LEAN MEAT Never Smoked Alcohol use-no Drug use-no  Review of Systems General:  Denies fatigue and fever. CV:  Denies chest pain or discomfort. Resp:  Denies shortness of breath. GI:  Denies abdominal pain. GU:  Denies dysuria.  Physical Exam  General:  Overwewight female in NAD Eyes:  No corneal or conjunctival inflammation noted. EOMI. Perrla. Vision grossly normal. Mouth:  MMM Neck:  no carotid bruit or thyromegaly no cervical or supraclavicular lymphadenopathy  Lungs:  Normal respiratory effort, chest expands symmetrically. Lungs are clear to auscultation, no crackles or wheezes. Heart:  Normal rate and regular rhythm. S1 and S2 normal without gallop, murmur, click, rub or other extra sounds. Abdomen:  Bowel sounds positive,abdomen soft and non-tender without masses, organomegaly or hernias noted. Pulses:  R and L posterior tibial pulses are full and equal bilaterally  Extremities:  no edema Skin:  Intact without suspicious lesions or rashes Psych:  Oriented X3, memory intact for recent and remote, normally interactive,  good eye contact, not anxious appearing, and not depressed appearing.    Diabetes Management Exam:    Foot Exam (with socks and/or shoes not present):       Sensory-Pinprick/Light touch:          Left medial foot (L-4): normal          Left dorsal foot (L-5): normal          Left lateral foot (S-1): normal          Right medial foot (L-4): normal          Right dorsal foot (L-5): normal          Right lateral foot (S-1): normal       Sensory-Monofilament:          Left foot: normal          Right foot: normal       Inspection:          Left foot: normal          Right foot: normal  Nails:          Left foot: normal          Right foot: normal   Impression & Recommendations:  Problem # 1:  HYPERKALEMIA (ICD-276.7) Decrease potassium in diet. Recheck today.  Orders: TLB-BMP (Basic Metabolic Panel-BMET) (80048-METABOL)  Problem # 2:  DEPRESSION (ICD-311) Anxiety.Marland Kitchenon lexapro. Increase in stress and anxiety.Marland Kitchengave rx for alprazolam..Kay Escoto to use temporarily.Marland Kitchenand in limited fasion. She voices understanding.  Her updated medication list for this problem includes:    Lexapro 10 Mg Tabs (Escitalopram oxalate) .[name]... Take 1 tablet by mouth two times a day    Alprazolam 0.25 Mg Tabs (Alprazolam) .[name]... 1 tab by mouth daily as needed anxiety  Problem # 3:  DM X 10 YEARS (ICD-250.00) Pt finally agreeable to insulin. Start 10 units lantus and titrate up 2 units every 2 days. Will contoue all oral meds, but will consider stopping glipizide at next app. Counseled on insulin and pen use. Total visit time > 50% spent counseling and cordinating patients care.   Her updated medication list for this problem includes:    Bayer Low Strength 81 Mg Tbec (Aspirin) .[name]... Take 1 tablet by mouth once a day    Metformin Hcl 500 Mg Tabs (Metformin hcl) .[name]... Take 2 tablets  by mouth once daily    Glucotrol Xl 10 Mg Tb24 (Glipizide) .[name]... Take 1  tablet by mouth once a day    Januvia 100  Mg Tabs (Sitagliptin phosphate) ..... One at bedtime    Lisinopril 10 Mg Tabs (Lisinopril) .[name]... Take 1 tablet by mouth once a day    Lantus Solostar 100 Unit/ml Soln (Insulin glargine) .Marland KitchenMarland KitchenMarland KitchenMarland Kitchen 10 units daily.Marland Kitchentitrate up insulin every 2 days until fasting blood sugars are <120  Problem # 4:  HYPERCHOLESTEROLEMIA (ICD-272.0) Inadequate control..if able to will increas pravachol to 3 times a week.  Her updated medication list for this problem includes:    Zetia 10 Mg Tabs (Ezetimibe) .[name]... Take 1 tab by mouth at bedtime    Pravachol 20 Mg Tabs (Pravastatin sodium) .[name]... 1 tab by mouth daily  Complete Medication List: 1)  Zetia 10 Mg Tabs (Ezetimibe) .... Take 1 tab by mouth at bedtime 2)  Lexapro 10 Mg Tabs (Escitalopram oxalate) .... Take 1 tablet by mouth two times a day 3)  Calcium 500/d 500-200 Mg-unit Tabs (Calcium carbonate-vitamin d) .... Take 1 tablet by mouth once a day 4)  Bayer Low Strength 81 Mg Tbec (Aspirin) .... Take 1 tablet by mouth once a day 5)  Zantac 150 Maximum Strength 150 Mg Tabs (Ranitidine hcl) .... Take 1 by mouth once a day 6)  Metformin Hcl 500 Mg Tabs (Metformin hcl) .... Take 2 tablets  by mouth once daily 7)  Glucotrol Xl 10 Mg Tb24 (Glipizide) .... Take 1  tablet by mouth once a day 8)  Multivitamins Tabs (Multiple vitamin) .... Take 1 tablet by mouth once a day 9)  Accu-chek Active Strp (Glucose blood) .... Test blood sugar two times a day 10)  Detrol La 4 Mg Xr24h-cap (Tolterodine tartrate) .... Take 1 tablet by mouth once a day 11)  Januvia 100 Mg Tabs (Sitagliptin phosphate) .... One at bedtime 12)  Pravachol 20 Mg Tabs (Pravastatin sodium) .[name].. 1 tab by mouth daily 13)  Lisinopril 10 Mg Tabs (Lisinopril) .... Take 1 tablet by mouth once a day 14)  Alprazolam 0.25 Mg Tabs (Alprazolam) .[name].. 1 tab by mouth daily as needed anxiety 15)  Lantus  Solostar 100 Unit/ml Soln (Insulin glargine) .[name].. 10 units daily.Marland Kitchentitrate up insulin every 2 days until fasting blood  sugars are <120 16)  Pen Needles For Solostar  .... Size of choice  administer daily  dx 250.00  Diabetes Management Assessment/Plan:      The following lipid goals have been established for the patient: Total cholesterol goal of 200; LDL cholesterol goal of 100; HDL cholesterol goal of 40; Triglyceride goal of 150.  Her blood pressure goal is < 130/80.    Lipid Assessment/Plan:      Based on NCEP/ATP III, the patient's risk factor category is "history of diabetes".  The patient's lipid goals are as follows: Total cholesterol goal is 200; LDL cholesterol goal is 100; HDL cholesterol goal is 40; Triglyceride goal is 150.  Her LDL cholesterol goal has not been met.    Patient Instructions: 1)  Follow up in 2-3 weeks 30 min appt DM/insulin. 2)  Bring in blood sugar record. 3)  Titrate up insulin  units every 2 days until at goal <120. 4)  If lower blood sugars may..stop glipizide, 5)   Call if frequent blood sugars <60. 6)  Increase pravachol to 3 times a week if no side effects.  7)  Please schedule a follow-up appointment in 3 months  for CPX.  8)  BMP prior to visit, ICD-9:  9)  Hepatic Panel prior to visit ICD-9:  10)  Lipid panel prior to visit ICD-9 :  11)  HgBA1c prior to visit  ICD-9:  12)  Decrease high potassium foods. Prescriptions: PEN NEEDLES FOR SOLOSTAR size of choice  Administer daily  Dx 250.00  #1 box x 11   Entered and Authorized by:   Kerby Nora MD   Signed by:   Kerby Nora MD on 12/12/2009   Method used:   Print then Give to Patient   RxID:   [id number] LANTUS SOLOSTAR 100 UNIT/ML SOLN (INSULIN GLARGINE) 10 units daily.Marland Kitchentitrate up insulin every 2 days until fasting blood sugars are <120  #1 box x 0   Entered and Authorized by:   Kerby Nora MD   Signed by:   Kerby Nora MD on 12/12/2009   Method used:   Electronically to        [facility]* (retail)       [address]       [place], [place]  [address]       Ph: [phone]       Fax: [phone]    RxID:   [id number] ALPRAZOLAM 0.25 MG TABS (ALPRAZOLAM) 1 tab by mouth daily as needed anxiety  #30 x 0   Entered and Authorized by:   Kerby Nora MD   Signed by:   Kerby Nora MD on 12/12/2009   Method used:   Print then Give to Patient   RxID:   [id number]   Current Allergies (reviewed today): ! PRAVACHOL * CORTISONE (HYDROCORTISONE)   Appended Document: 3 m f/u dlo     Clinical Lists Changes  Orders: Added new Service order of Specimen Handling ([id number]) - Signed Added new Test order of T-Basic Metabolic Panel [phone]) - Signed

## 2010-04-05 NOTE — Assessment & Plan Note (Signed)
Summary: ? uti   Vital Signs:  Patient profile:   65 year old female Weight:      212 pounds Temp:     98.2 degrees F oral Pulse rate:   72 / minute Pulse rhythm:   regular BP sitting:   120 / 70  (left arm) Cuff size:   large  Vitals Entered By: Selena Batten Dance CMA [facility] Dull) (January 12, 2010 8:28 AM) CC: ? UTI Comments Frequency/Urgency/LBP   History of Present Illness: CC: ? UTI  didn't feel good yesterday.  started with polyuria.  No dysuria.  + urgency.  + LBP and mild suprapubic pressure.  + chills last night.  + nausea  No fevers.  No vomiting.  No diarrhea.  No other abd pain.  No trauma to back or recent heavy lifting.  + drinks unsweet tea.  Diabetic.  now drinking cranberry juice and water.  + L ear hurting.  no smokers at home.  Current Medications (verified): 1)  Zetia 10 Mg  Tabs (Ezetimibe) .... Take 1 Tab By Mouth At Bedtime 2)  Lexapro 10 Mg  Tabs (Escitalopram Oxalate) .... Take 1 Tablet By Mouth Two Times A Day 3)  Calcium 500/d 500-200 Mg-Unit  Tabs (Calcium Carbonate-Vitamin D) .... Take 1 Tablet By Mouth Once A Day 4)  Bayer Low Strength 81 Mg  Tbec (Aspirin) .... Take 1 Tablet By Mouth Once A Day 5)  Zantac 150 Maximum Strength 150 Mg Tabs (Ranitidine Hcl) .... Take 1 By Mouth Once A Day 6)  Metformin Hcl 500 Mg Tabs (Metformin Hcl) .... Take 2 Tablets  By Mouth Once Daily 7)  Multivitamins   Tabs (Multiple Vitamin) .... Take 1 Tablet By Mouth Once A Day 8)  Accu-Chek Active   Strp (Glucose Blood) .... Test Blood Sugar Two Times A Day 9)  Detrol La 4 Mg Xr24h-Cap (Tolterodine Tartrate) .... Take 1 Tablet By Mouth Once A Day 10)  Januvia 100 Mg Tabs (Sitagliptin Phosphate) .... One At Bedtime 11)  Pravachol 20 Mg Tabs (Pravastatin Sodium) .[name].. 1 Tab By Mouth Daily 12)  Lisinopril 10 Mg Tabs (Lisinopril) .... Take 1 Tablet By Mouth Once A Day 13)  Alprazolam 0.25 Mg Tabs (Alprazolam) .[name].. 1 Tab By Mouth Daily As Needed Anxiety 14)  Lantus Solostar 100 Unit/ml  Soln (Insulin Glargine) .[name].. 12 Units Daily.Marland Kitchentitrate Up Insulin Every 2 Days Until Fasting Blood Sugars Are <120 15)  Pen Needles For Solostar .... Size of Choice  Administer Daily  Dx 250.00  Allergies: 1)  ! 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 FAST FOOD, SALADS, LEAN MEAT Never Smoked Alcohol use-no Drug use-no  Review of Systems       per HPI  Physical Exam  General:  Overwewight female in NAD Ears:  no external deformities.  TMs clera bilaterally Mouth:  MMM Lungs:  Normal respiratory effort, chest expands symmetrically. Lungs are clear to auscultation, no crackles or wheezes. Heart:  Normal rate and regular rhythm. S1 and S2 normal without gallop, murmur, click, rub or other extra sounds.  Abdomen:  Bowel sounds positive,abdomen soft and without masses, organomegaly or hernias noted.  very mild CVA tenderness.  + lower back pain.  + bilateral lower quadrant pressure, no suprapubic pressure. Pulses:  2+ rad pulses Extremities:  no pedal edema   Impression & Recommendations:  Problem # 1:  PYELONEPHRITIS (ICD-590.80)  UTI with systemic sxs.  treat with 7 d course cipro.  UCx sent.  Encouraged to push clear liquids, get enough rest, and take acetaminophen as needed. To be seen in 10 days if no improvement, sooner if worse.  drinks decaffeinated tea.   Her updated medication list  for this problem includes:    Detrol La 4 Mg Xr24h-cap (Tolterodine tartrate) .[name]... Take 1 tablet by mouth once a day    Ciprofloxacin Hcl 500 Mg Tabs (Ciprofloxacin hcl) .[name]... Take one twice daily x 7 days  Orders: UA Dipstick W/ Micro (manual) (16109) Specimen Handling (99000) T-Culture, Urine ([id number])  Complete Medication List: 1)  Zetia 10 Mg Tabs (Ezetimibe) .... Take 1 tab by mouth at bedtime 2)  Lexapro 10 Mg Tabs (Escitalopram oxalate) .... Take 1 tablet by mouth two times a day 3)  Calcium 500/d 500-200 Mg-unit Tabs (Calcium carbonate-vitamin d) .... Take 1 tablet by mouth once a day 4)  Bayer Low Strength 81 Mg Tbec (Aspirin) .... Take 1 tablet by mouth once a day 5)  Zantac 150 Maximum Strength 150 Mg Tabs (Ranitidine hcl) .... Take 1 by mouth once a day 6)  Metformin Hcl 500 Mg Tabs (Metformin hcl) .... Take 2 tablets  by mouth once daily 7)  Multivitamins Tabs (Multiple vitamin) .... Take 1 tablet by mouth once a day 8)  Accu-chek Active Strp (Glucose blood) .... Test blood sugar two times a day 9)  Detrol La 4 Mg Xr24h-cap (Tolterodine tartrate) .... Take 1 tablet by mouth once a day 10)  Januvia 100 Mg Tabs (Sitagliptin phosphate) .... One at bedtime 11)  Pravachol 20 Mg Tabs (Pravastatin sodium) .[name].. 1 tab by mouth daily 12)  Lisinopril 10 Mg Tabs (Lisinopril) .... Take 1 tablet by mouth once a day 13)  Alprazolam 0.25 Mg Tabs (Alprazolam) .[name].. 1 tab by mouth daily as needed anxiety 14)  Lantus Solostar 100 Unit/ml Soln (Insulin glargine) .[name].. 12 units daily.Marland Kitchentitrate up insulin every 2 days until fasting blood sugars are <120 15)  Pen Needles For Solostar  .... Size of choice  administer daily  dx 250.00 16)  Ciprofloxacin Hcl 500 Mg Tabs (Ciprofloxacin hcl) .... Take one twice daily x 7 days  Patient Instructions: 1)  Looks like infection. 2)  Treat with cipro 500mg  twice daily for 7 days. 3)  Push water and cranberry juice. 4)  Call clinic with questions, or if  not feeling better in 3-5 days. 5)  Pleasure to see you today. Prescriptions: CIPROFLOXACIN HCL 500 MG TABS (CIPROFLOXACIN HCL) take one twice daily x 7 days  #14 x 0   Entered and Authorized by:   Eustaquio Boyden  MD   Signed by:   Eustaquio Boyden  MD on 01/12/2010   Method used:   Electronically to        [facility]* (retail)       [id number]       [place], [place]  [address]       Ph: [phone]       Fax: [phone]   RxID:   [id number]    Orders Added: 1)  Est. Patient Level III [66440] 2)  UA Dipstick W/ Micro (manual) [81000] 3)  Specimen Handling [99000] 4)  T-Culture, Urine [[id number]]    Current Allergies (reviewed today): ! PRAVACHOL * CORTISONE (HYDROCORTISONE)  Laboratory Results   Urine Tests  Date/Time Received: January 12, 2010  Date/Time Reported: January 12, 2010   Routine Urinalysis   Color: lt. yellow Appearance: Cloudy Glucose: >=1000   (Normal Range: Negative) Bilirubin: negative   (Normal Range: Negative) Ketone: negative   (Normal Range: Negative) Spec. Gravity: 1.025   (Normal Range: 1.003-1.035) Blood: moderate   (Normal Range: Negative) pH: 5.0   (Normal Range: 5.0-8.0) Protein: trace   (Normal Range: Negative) Urobilinogen: 0.2   (Normal Range: 0-1) Nitrite: positive   (Normal Range: Negative) Leukocyte Esterace: moderate   (Normal Range: Negative)  Urine Microscopic WBC/HPF: TNTC RBC/HPF: 5-10 Bacteria/HPF: 1+ Epithelial/HPF: 1-5 Crystals/HPF: no Casts/LPF: no    Comments: read by .......................Eustaquio Boyden  MD  January 12, 2010 8:44 AM UCx sent.

## 2010-04-05 NOTE — Assessment & Plan Note (Signed)
Summary: BEDSOLE FLU SHOT/[facility]   Nurse Visit   Allergies: 1)  ! Pravachol 2)  * Cortisone (Hydrocortisone)  Orders Added: 1)  Admin 1st Vaccine [90471] 2)  Flu Vaccine 57yrs + [84696] Flu Vaccine Consent Questions     Do you have a history of severe allergic reactions to this vaccine? no    Any prior history of allergic reactions to egg and/or gelatin? no    Do you have a sensitivity to the preservative Thimersol? no    Do you have a past history of Guillan-Barre Syndrome? no    Do you currently have an acute febrile illness? no    Have you ever had a severe reaction to latex? no    Vaccine information given and explained to patient? yes    Are you currently pregnant? no    Lot Number:[id number]   Exp Date:09/01/2010   Site Given  Left Deltoid IMu

## 2010-04-05 NOTE — Letter (Signed)
Summary: [facility]  [facility]   Imported By: Lanelle Bal 05/26/2009 12:31:55  _____________________________________________________________________  External Attachment:    Type:   Image     Comment:   External Document

## 2010-04-05 NOTE — Assessment & Plan Note (Signed)
Summary: ROA 3 [facility] F/U ON LABS  CYD   Vital Signs:  Patient profile:   65 year old female Height:      65.75 inches Weight:      213.0 pounds BMI:     34.77 Temp:     98.4 degrees F oral Pulse rate:   80 / minute Pulse rhythm:   regular BP sitting:   140 / 82  (left arm) Cuff size:   large  Vitals Entered By: Benny Lennert CMA Duncan Dull) (Jul 14, 2009 10:55 AM)  History of Present Illness: Chief complaint 3 month follow up on labs  Continued lifestyle changes, weight loss...11 lb weight loss in laNo lows less than 60.st 3 months.  DM, well controlled.  Seeing endocrinologist once a year Continues Januvia. Stopped actos 3/15. .. blood sugars FBS 90-140, 2 hours after meals..127.   BP up today...but just had biopsies at [facility].   Diabetes Management History:      She is (or has been) enrolled in the "Diabetic Education Program".  She is checking home blood sugars.  She says that she is exercising.  Type of exercise includes: walking.  Duration of exercise is estimated to be 30 min.  She is doing this 2 times per week.    Lipid Management History:      Positive NCEP/ATP III risk factors include female age 38 years old or older and diabetes.  Negative NCEP/ATP III risk factors include non-tobacco-user status.        Her compliance with the TLC diet is fair.  She notes side effects from her lipid-lowering medication.  Comments include: only using pravachol 2 times a week due to myalgia. .     Problems Prior to Update: 1)  Sleep Apnea  (ICD-780.57) 2)  Stress Incontinence  (ICD-788.39) 3)  Diverticulosis, Colon  (ICD-562.10) 4)  Hemorrhoids, Internal  (ICD-455.0) 5)  Obesity, Bmi 38  (ICD-278.00) 6)  Depression  (ICD-311) 7)  Hiatal Hernia/gerd  (ICD-553.3) 8)  Hypercholesterolemia  (ICD-272.0) 9)  Dm X 10 Years  (ICD-250.00)  Current Medications (verified): 1)  Zetia 10 Mg  Tabs (Ezetimibe) .... Take 1 Tab By Mouth At Bedtime 2)  Lexapro 10 Mg  Tabs (Escitalopram Oxalate)  .... Take 1 Tablet By Mouth Two Times A Day 3)  Calcium 500/d 500-200 Mg-Unit  Tabs (Calcium Carbonate-Vitamin D) .... Take 1 Tablet By Mouth Once A Day 4)  Bayer Low Strength 81 Mg  Tbec (Aspirin) .... Take 1 Tablet By Mouth Once A Day 5)  Actos 45 Mg Tabs (Pioglitazone Hcl) .... Take 1 Tablet By Mouth Once A Day 6)  Zantac 150 Maximum Strength 150 Mg Tabs (Ranitidine Hcl) .... Take 1 By Mouth Once A Day 7)  Metformin Hcl 500 Mg Tabs (Metformin Hcl) .... Take 2 Tablets  By Mouth Once Daily 8)  Glucotrol Xl 10 Mg Tb24 (Glipizide) .... Take 1/2  Tablet By Mouth Once A Day 9)  Multivitamins   Tabs (Multiple Vitamin) .... Take 1 Tablet By Mouth Once A Day 10)  Accu-Chek Active   Strp (Glucose Blood) .... Test Blood Sugar Two Times A Day 11)  Detrol La 4 Mg Xr24h-Cap (Tolterodine Tartrate) .... Take 1 Tablet By Mouth Once A Day 12)  Januvia 100 Mg Tabs (Sitagliptin Phosphate) .... One At Bedtime 13)  Pravachol 20 Mg Tabs (Pravastatin Sodium) .[name].. 1 Tab By Mouth Daily 14)  Lisinopril 10 Mg Tabs (Lisinopril) .... Take 1 Tablet By Mouth Once A Day  Allergies:  1)  ! Pravachol 2)  * Cortisone (Hydrocortisone)  Past History:  Past medical, surgical, family and social histories (including risk factors) reviewed, and no changes noted (except as noted below).  Past Medical History: Reviewed history from 04/14/2008 and no changes required. Current Problems:  ROTATOR CUFF SYNDROME, LEFT (ICD-726.10) URI (ICD-465.9) HEADACHE (ICD-784.0) STRESS INCONTINENCE (ICD-788.39) DIVERTICULOSIS, COLON (ICD-562.10) HEMORRHOIDS, INTERNAL (ICD-455.0) OBESITY, BMI 38 (ICD-278.00) DEPRESSION (ICD-311) HIATAL HERNIA/GERD (ICD-553.3) HYPERCHOLESTEROLEMIA (ICD-272.0) DM X 10 YEARS (ICD-250.00)    Past Surgical History: Reviewed history from 02/03/2009 and no changes required. 1989 TONSILLECTOMY 1972 HYSTERECTOMYpartial for MENORRHAGIA 1995 CHOLECYSTECTOMY 1997 BREAST REDUCTION 05/2006 RIGHT RENAL CYST,  STABLE, NO F/U   2009 stress ECHO...low risk 2010 cardiac cath...no CAD  Family History: Reviewed history from 06/11/2006 and no changes required. Father: DIED 65 TOB ABUSE, LUNG CA Mother: ALIVE 40 FALLOPIAN TUBE CA, ALZHEIMER'S, PULMONARY EMBOLISM Siblings: 1 BROTHER DM               1 SISTER HTN PGM AND PGF:  TIA'S, CVA CV:  (-) MI <65  Social History: Reviewed history from 06/11/2006 and no changes required. 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 FAST FOOD, SALADS, LEAN MEAT Never Smoked Alcohol use-no Drug use-no  Review of Systems General:  Denies fatigue and fever. CV:  Denies chest pain or discomfort. Resp:  Denies shortness of breath. GI:  Denies abdominal pain.  Physical Exam  General:  Overwewight female in NAD Mouth:  MMM Neck:  no carotid bruit or thyromegaly no cervical or supraclavicular lymphadenopathy  Lungs:  Normal respiratory effort, chest expands symmetrically. Lungs are clear to auscultation, no crackles or wheezes. Heart:  Normal rate and regular rhythm. S1 and S2 normal without gallop, murmur, click, rub or other extra sounds. Abdomen:  Bowel sounds positive,abdomen soft and non-tender without masses, organomegaly or hernias noted. Pulses:  R and L posterior tibial pulses are full and equal bilaterally  Extremities:  no edema  Skin:  Large flat macular area on pannus, red, warm to touch. Demarcated edges with pen.  Diabetes Management Exam:    Foot Exam (with socks and/or shoes not present):       Sensory-Pinprick/Light touch:          Left medial foot (L-4): normal          Left dorsal foot (L-5): normal          Left lateral foot (S-1): normal          Right medial foot (L-4): normal          Right dorsal foot (L-5): normal          Right lateral foot (S-1): normal       Sensory-Monofilament:          Left foot: normal          Right foot:  normal       Inspection:          Left foot: normal          Right foot: normal       Nails:          Left foot: normal          Right foot: normal    Eye Exam:       Eye Exam done elsewhere          Date: 05/02/2009  Results: normal          Done by: eye MD   Impression & Recommendations:  Problem # 1:  DM X 10 YEARS (ICD-250.00) Well controlled. Continue current medication. Discussed need to increase merformin if blood sugars trending up. COnitnue weight loss.  The following medications were removed from the medication list:    Actos 45 Mg Tabs (Pioglitazone hcl) .[name]... Take 1 tablet by mouth once a day Her updated medication list for this problem includes:    Bayer Low Strength 81 Mg Tbec (Aspirin) .[name]... Take 1 tablet by mouth once a day    Metformin Hcl 500 Mg Tabs (Metformin hcl) .[name]... Take 2 tablets  by mouth once daily    Glucotrol Xl 10 Mg Tb24 (Glipizide) .[name]... Take 1/2  tablet by mouth once a day    Januvia 100 Mg Tabs (Sitagliptin phosphate) ..... One at bedtime    Lisinopril 10 Mg Tabs (Lisinopril) .[name]... Take 1 tablet by mouth once a day  Problem # 2:  HYPERCHOLESTEROLEMIA (ICD-272.0) Well controlled. Continue current medication. Encouraged exercise, weight loss, healthy eating habits.  Her updated medication list for this problem includes:    Zetia 10 Mg Tabs (Ezetimibe) .[name]... Take 1 tab by mouth at bedtime    Pravachol 20 Mg Tabs (Pravastatin sodium) .[name]... 1 tab by mouth daily  Labs Reviewed: SGOT: 15 (04/19/2009)   SGPT: 13 (04/19/2009)  Lipid Goals: Chol Goal: 200 (03/11/2007)   HDL Goal: 40 (03/11/2007)   LDL Goal: 100 (03/11/2007)   TG Goal: 150 (03/11/2007)  10 Yr Risk Heart Disease: 20 % Prior 10 Yr Risk Heart Disease: 13 % (04/28/2009)   HDL:42 (07/10/2009), 44 (04/19/2009)  LDL:98 (07/10/2009), 109 (04/54/0981)  Chol:153 (07/10/2009), 169 (04/19/2009)  Trig:63 (07/10/2009), 82 (04/19/2009)  Problem # 3:  DEPRESSION (ICD-311) Well controlled.    Her updated medication list for this problem includes:    Lexapro 10 Mg Tabs (Escitalopram oxalate) .[name]... Take 1 tablet by mouth two times a day  Complete Medication List: 1)  Zetia 10 Mg Tabs (Ezetimibe) .... Take 1 tab by mouth at bedtime 2)  Lexapro 10 Mg Tabs (Escitalopram oxalate) .... Take 1 tablet by mouth two times a day 3)  Calcium 500/d 500-200 Mg-unit Tabs (Calcium carbonate-vitamin d) .... Take 1 tablet by mouth once a day 4)  Bayer Low Strength 81 Mg Tbec (Aspirin) .... Take 1 tablet by mouth once a day 5)  Zantac 150 Maximum Strength 150 Mg Tabs (Ranitidine hcl) .... Take 1 by mouth once a day 6)  Metformin Hcl 500 Mg Tabs (Metformin hcl) .... Take 2 tablets  by mouth once daily 7)  Glucotrol Xl 10 Mg Tb24 (Glipizide) .... Take 1/2  tablet by mouth once a day 8)  Multivitamins Tabs (Multiple vitamin) .... Take 1 tablet by mouth once a day 9)  Accu-chek Active Strp (Glucose blood) .... Test blood sugar two times a day 10)  Detrol La 4 Mg Xr24h-cap (Tolterodine tartrate) .... Take 1 tablet by mouth once a day 11)  Januvia 100 Mg Tabs (Sitagliptin phosphate) .... One at bedtime 12)  Pravachol 20 Mg Tabs (Pravastatin sodium) .[name].. 1 tab by mouth daily 13)  Lisinopril 10 Mg Tabs (Lisinopril) .... Take 1 tablet by mouth once a day  Diabetes Management Assessment/Plan:      The following lipid goals have been established for the patient: Total cholesterol goal of 200; LDL cholesterol goal of 100; HDL cholesterol goal of 40; Triglyceride goal of 150.  Her blood pressure goal is < 130/80.    Lipid Assessment/Plan:      Based on NCEP/ATP III, the patient's risk factor category is "history of diabetes".  The patient's lipid goals are as follows: Total cholesterol goal is 200; LDL cholesterol goal is 100; HDL cholesterol goal is 40; Triglyceride goal is 150.  Her LDL cholesterol goal has been met.    Patient Instructions: 1)  Increase metformin or glipizide as instructed by  endocrinologist if BS are creeping up. 2)   Follow BP at home . Call: if greater than 130/80 x  3. 3)  Ask insurance about shingles vaccine.  4)  Please schedule a follow-up appointment in 3 months .  5)  BMP prior to visit, ICD-9:  6)  Hepatic Panel prior to visit ICD-9:  7)  HgBA1c prior to visit  ICD-9:  8)  Urine Microalbumin prior to visit ICD-9 :   Current Allergies (reviewed today): ! PRAVACHOL * CORTISONE (HYDROCORTISONE)  Last PAP:  Normal (03/04/2006 10:15:43 AM) PAP Result Date:  05/02/2009 PAP Result:  normal DVE PAP Next Due:  1 yr Boine Desity: nml 05/2009

## 2010-04-05 NOTE — Assessment & Plan Note (Signed)
Summary: 30 MIN. APPT - 3WK F/U DM/INSULIN / LFW   Vital Signs:  Patient profile:   65 year old female Height:      65.75 inches Weight:      209.0 pounds BMI:     34.11 Temp:     98.3 degrees F oral Pulse rate:   76 / minute Pulse rhythm:   regular BP sitting:   110 / 68  (left arm) Cuff size:   large  Vitals Entered By: Benny Lennert CMA Duncan Dull) (January 09, 2010 12:00 PM)  History of Present Illness: Chief complaint 3 wk follow up   DM...lantus 10 units since 10/12.  Has not titrated it up at all. ..was nervous to do so on her own. 107-165 2 hour post prandail 83-154.  No problmes injectio=ng it. She states she feels better overal..more energy.  Still on januvia, metformin and glucotrol.  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 Take 1 Tab By Mouth At Bedtime 2)  Lexapro 10 Mg  Tabs (Escitalopram Oxalate) .... Take 1 Tablet By Mouth Two Times A Day 3)  Calcium 500/d 500-200 Mg-Unit  Tabs (Calcium Carbonate-Vitamin D) .... Take 1 Tablet By Mouth Once A Day 4)  Bayer Low Strength 81 Mg  Tbec (Aspirin) .... Take 1 Tablet By Mouth Once A Day 5)  Zantac 150 Maximum Strength 150 Mg Tabs (Ranitidine Hcl) .... Take 1 By Mouth Once A Day 6)  Metformin Hcl 500 Mg Tabs (Metformin Hcl) .... Take 2 Tablets  By Mouth Once Daily 7)  Multivitamins   Tabs (Multiple Vitamin) .... Take 1 Tablet By Mouth Once A Day 8)  Accu-Chek Active   Strp (Glucose Blood) .... Test Blood Sugar Two Times A Day 9)  Detrol La 4 Mg Xr24h-Cap (Tolterodine  Tartrate) .... Take 1 Tablet By Mouth Once A Day 10)  Januvia 100 Mg Tabs (Sitagliptin Phosphate) .... One At Bedtime 11)  Pravachol 20 Mg Tabs (Pravastatin Sodium) .[name].. 1 Tab By Mouth Daily 12)  Lisinopril 10 Mg Tabs (Lisinopril) .... Take 1 Tablet By Mouth Once A Day 13)  Alprazolam 0.25 Mg Tabs (Alprazolam) .[name].. 1 Tab By Mouth Daily As Needed Anxiety 14)  Lantus Solostar 100 Unit/ml Soln (Insulin Glargine) .[name].. 12 Units Daily.Marland Kitchentitrate Up Insulin Every 2 Days Until Fasting Blood Sugars Are <120 15)  Pen Needles For Solostar .... Size of Choice  Administer Daily  Dx 250.00  Allergies: 1)  ! Pravachol 2)  * Cortisone (Hydrocortisone)  Past History:  Past medical, surgical, family and social histories (including risk factors) reviewed, and no changes noted (except as noted below).  Past Medical History: Reviewed history from 04/14/2008 and no changes required. Current Problems:  ROTATOR CUFF SYNDROME, LEFT (ICD-726.10) URI (ICD-465.9) HEADACHE (ICD-784.0) STRESS INCONTINENCE (ICD-788.39) DIVERTICULOSIS, COLON (ICD-562.10) HEMORRHOIDS, INTERNAL (ICD-455.0) OBESITY, BMI 38 (ICD-278.00) DEPRESSION (ICD-311) HIATAL HERNIA/GERD (ICD-553.3) HYPERCHOLESTEROLEMIA (ICD-272.0) DM X 10 YEARS (ICD-250.00)    Past Surgical History: Reviewed history from 02/03/2009 and no changes required. 1989 TONSILLECTOMY 1972 HYSTERECTOMYpartial for MENORRHAGIA 1995 CHOLECYSTECTOMY 1997 BREAST  REDUCTION 05/2006 RIGHT RENAL CYST, STABLE, NO F/U   2009 stress ECHO...low risk 2010 cardiac cath...no CAD  Family History: Reviewed history from 06/11/2006 and no changes required. Father: DIED 65 TOB ABUSE, LUNG CA Mother: ALIVE 66 FALLOPIAN TUBE CA, ALZHEIMER'S, PULMONARY EMBOLISM Siblings: 1 BROTHER DM               1 SISTER HTN PGM AND PGF:  TIA'S, CVA CV:  (-) MI <65  Social History: Reviewed history from 06/11/2006 and no changes required. 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 FAST FOOD, SALADS, LEAN MEAT Never Smoked Alcohol use-no Drug use-no  Physical Exam  General:  Overwewight female in NAD Mouth:  MMM Neck:  no carotid bruit or thyromegaly no cervical or supraclavicular lymphadenopathy  Lungs:  Normal respiratory effort, chest expands symmetrically. Lungs are clear to auscultation, no crackles or wheezes. Heart:  Normal rate and regular rhythm. S1 and S2 normal without gallop, murmur, click, rub or other extra sounds. Pulses:  R and L posterior tibial pulses are full and equal bilaterally  Extremities:  no edema Skin:  Intact without suspicious lesions or rashes   Impression & Recommendations:  Problem # 1:  DM X 10 YEARS (ICD-250.00) Continue to toitrate up insulin to goal. Contnue lifestyle change. Stop sulfonourea as likely not very helpful, to simplify regimen and to avoid hypoglycemia. Follow up in 2 months for A1C.  The following medications were removed from the medication list:    Glucotrol Xl 10 Mg Tb24 (Glipizide) .[name]... Take 1  tablet by mouth once a day Her updated medication list for this problem includes:    Bayer Low Strength 81 Mg Tbec (Aspirin) .[name]... Take 1 tablet by mouth once a day    Metformin Hcl 500 Mg Tabs (Metformin hcl) .[name]... Take 2 tablets  by mouth once daily    Januvia 100 Mg Tabs (Sitagliptin phosphate) ..... One at bedtime    Lisinopril 10 Mg Tabs (Lisinopril) .[name]... Take 1 tablet by mouth once a day    Lantus Solostar 100 Unit/ml Soln (Insulin glargine) .[name]... 12 units daily.Marland Kitchentitrate up insulin every 2 days until fasting blood sugars are <120  Labs Reviewed: Creat: 0.90 (12/12/2009)   Microalbumin: NORMAL (03/04/2006)  Last Eye Exam: normal (12/02/2008) Reviewed HgBA1c results: 7.7 (12/05/2009)  7.1 (09/26/2009)  Complete Medication List: 1)  Zetia 10 Mg Tabs (Ezetimibe) .... Take 1 tab by mouth at bedtime 2)  Lexapro 10 Mg Tabs  (Escitalopram oxalate) .... Take 1 tablet by mouth two times a day 3)  Calcium 500/d 500-200 Mg-unit Tabs (Calcium carbonate-vitamin d) .... Take 1 tablet by mouth once a day 4)  Bayer Low Strength 81 Mg Tbec (Aspirin) .... Take 1 tablet by mouth once a day 5)  Zantac 150 Maximum Strength 150 Mg Tabs (Ranitidine hcl) .... Take 1 by mouth once a day 6)  Metformin Hcl 500 Mg Tabs (Metformin hcl) .... Take 2 tablets  by mouth once daily 7)  Multivitamins Tabs (Multiple vitamin) .... Take 1 tablet by mouth once a day 8)  Accu-chek Active Strp (Glucose blood) .... Test blood sugar two times a day 9)  Detrol La 4 Mg Xr24h-cap (Tolterodine tartrate) .... Take 1 tablet by mouth once a day 10)  Januvia 100 Mg Tabs (Sitagliptin phosphate) .... One at bedtime 11)  Pravachol 20 Mg Tabs (Pravastatin sodium) .[name].. 1 tab by mouth daily 12)  Lisinopril 10 Mg Tabs (Lisinopril) .... Take 1 tablet by mouth once a day 13)  Alprazolam 0.25 Mg Tabs (Alprazolam) .[name].. 1 tab by mouth daily as needed anxiety 14)  Lantus Solostar 100 Unit/ml Soln (Insulin glargine) .[name].. 12 units daily.Marland Kitchentitrate up insulin every 2 days until fasting blood sugars are <120 15)  Pen Needles For Solostar  .... Size of choice  administer daily  dx 250.00  Patient Instructions: 1)  Increase lantus 12 Units. 2)  Stop glucotrol.  3)  Call/fax blood sugars in 1-2 weeks to discuss titrating up insulin further. 4)  No skipping meals.  5)  Follow up in 2 month DM as scheduled already.  6)  HbgA1C prior to visit, ICD-9:   Orders Added: 1)  Est. Patient Level III [60454]    Current Allergies (reviewed today): ! PRAVACHOL * CORTISONE (HYDROCORTISONE)

## 2010-04-05 NOTE — Letter (Signed)
Summary: [facility]  [facility]   Imported By: Lanelle Bal 06/29/2009 12:52:08  _____________________________________________________________________  External Attachment:    Type:   Image     Comment:   External Document

## 2010-04-05 NOTE — Assessment & Plan Note (Signed)
Summary: 3 m f/u dlo   Vital Signs:  Patient profile:   65 year old female Height:      65.75 inches Weight:      207.4 pounds BMI:     33.85 Temp:     97.8 degrees F oral Pulse rate:   80 / minute Pulse rhythm:   regular BP sitting:   110 / 78  (left arm) Cuff size:   large  Vitals Entered By: Benny Lennert CMA (AAMA) (October 02, 2009 8:16 AM)  History of Present Illness: Chief complaint 3 month follow up  DM, worsened control: on metformin, januvia and glucotrol Not eating as well over the summer.. dessert, ice cream.  CBGs at home: FBS 112-137,  2 hours after meals <140.   Occ low 2-3 times in last month in evening.Eating a bedtime snack.[place]  Checking 2  times daily. Hs lost 7 lbs weight loss.  Continuing to exercise.   BP at goal <130/80 on lisinopril 10 mg daily.  Pain in right buttock, intermittant...no radiation..ongoing x 1 month. No pain on right hip when lying on side...notes pain with turning.   No numbness, no tingling.  no wekness i legs. No change with movement or sitting. 4/10 on pain scale.  Not using anything for pain.     Lipid Management History:      Positive NCEP/ATP III risk factors include female age 67 years old or older and diabetes.  Negative NCEP/ATP III risk factors include non-tobacco-user status.        Her compliance with the TLC diet is fair.  Adjunctive measures started by the patient include aerobic exercise and weight reduction.  She expresses no side effects from her lipid-lowering medication.  The patient denies any symptoms to suggest myopathy or liver disease.     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 Take 1 Tab By Mouth At Bedtime 2)  Lexapro 10 Mg  Tabs (Escitalopram Oxalate) .... Take 1 Tablet By Mouth Two Times A Day 3)  Calcium 500/d 500-200 Mg-Unit  Tabs (Calcium Carbonate-Vitamin D) .... Take 1 Tablet By Mouth Once A Day 4)  Bayer Low Strength 81 Mg  Tbec (Aspirin) .... Take 1 Tablet By Mouth Once A Day 5)  Zantac 150 Maximum Strength 150 Mg Tabs (Ranitidine Hcl) .... Take 1 By Mouth Once A Day 6)  Metformin Hcl 500 Mg Tabs (Metformin Hcl) .... Take 2 Tablets  By Mouth Once Daily 7)  Glucotrol Xl 10 Mg Tb24 (Glipizide) .... Take 1  Tablet By Mouth Once A Day 8)  Multivitamins   Tabs (Multiple Vitamin) .... Take 1 Tablet By Mouth Once A Day 9)  Accu-Chek Active   Strp (Glucose Blood) .... Test Blood Sugar Two Times A Day 10)  Detrol La 4 Mg Xr24h-Cap (Tolterodine Tartrate) .... Take 1 Tablet By Mouth Once A Day 11)  Januvia 100 Mg Tabs (Sitagliptin Phosphate) .... One At Bedtime 12)  Pravachol 20 Mg Tabs (Pravastatin Sodium) .[name].. 1 Tab By Mouth Daily 13)  Lisinopril 10 Mg Tabs (Lisinopril) .... Take 1 Tablet By Mouth Once A Day  Allergies: 1)  ! Pravachol 2)  * Cortisone (Hydrocortisone)  Past History:  Past medical, surgical, family and social histories (including risk factors) reviewed, and no changes noted (except as noted below).  Past Medical History: Reviewed history from 04/14/2008 and no changes required. Current Problems:  ROTATOR CUFF SYNDROME, LEFT (ICD-726.10) URI (ICD-465.9) HEADACHE (ICD-784.0) STRESS INCONTINENCE (ICD-788.39) DIVERTICULOSIS, COLON (ICD-562.10) HEMORRHOIDS, INTERNAL (ICD-455.0) OBESITY, BMI 38 (ICD-278.00) DEPRESSION (ICD-311) HIATAL HERNIA/GERD (ICD-553.3) HYPERCHOLESTEROLEMIA (ICD-272.0) DM X 10 YEARS (ICD-250.00)    Past Surgical History: Reviewed history from 02/03/2009 and no changes required. 1989 TONSILLECTOMY 1972 HYSTERECTOMYpartial for MENORRHAGIA 1995  CHOLECYSTECTOMY 1997 BREAST REDUCTION 05/2006 RIGHT RENAL CYST, STABLE, NO F/U   2009 stress ECHO...low risk 2010 cardiac cath...no CAD  Family History: Reviewed history from 06/11/2006 and no changes required. Father: DIED 65 TOB ABUSE, LUNG CA Mother: ALIVE 74 FALLOPIAN TUBE CA, ALZHEIMER'S, PULMONARY EMBOLISM Siblings: 1 BROTHER DM               1 SISTER HTN PGM AND PGF:  TIA'S, CVA CV:  (-) MI <65  Social History: Reviewed history from 06/11/2006 and no changes required. 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 FAST FOOD, SALADS, LEAN MEAT Never Smoked Alcohol use-no Drug use-no  Review of Systems General:  Denies fatigue and fever. CV:  Denies chest pain or discomfort. Resp:  Denies shortness of breath. GI:  Denies abdominal pain and bloody stools. GU:  Denies dysuria.  Physical Exam  General:  Overwewight female in NAD Mouth:  MMM Neck:  no carotid bruit or thyromegaly no cervical or supraclavicular lymphadenopathy  Lungs:  Normal respiratory effort, chest expands symmetrically. Lungs are clear to auscultation, no crackles or wheezes. Heart:  Normal rate and regular rhythm. S1 and S2 normal without gallop, murmur, click, rub or other extra sounds. Msk:  tp right buttock over sciatic notch/piriformis...no pain over ant hip joint or latereal troch. bursa Neg Faber's, Neg SLR Pulses:  R and L posterior tibial pulses are full and equal bilaterally  Extremities:  no edema Neurologic:  No cranial nerve deficits noted. Station and gait are normal.  DTRs are symmetrical throughout. Sensory but pt describes numbness intermittant in diffuse left upper and lower arm and hand, motor and coordinative functions appear intact.  Diabetes Management Exam:    Foot Exam (with socks and/or shoes not present):       Sensory-Pinprick/Light touch:          Left medial foot (L-4): normal           Left dorsal foot (L-5): normal          Left lateral foot (S-1): normal          Right medial foot (L-4): normal          Right dorsal foot (L-5): normal          Right lateral foot (S-1): normal       Sensory-Monofilament:          Left foot: normal          Right foot: normal       Inspection:          Left foot: normal          Right foot: normal       Nails:          Left foot: normal  Right foot: normal   Impression & Recommendations:  Problem # 1:  DM X 10 YEARS (ICD-250.00) Worsened control due to diet. Occ lows at lunch following glucotrol...change to taking in AMs.  BP at goal on lisinopril daily. Follow up in 3 months. Her updated medication list for this problem includes:    Bayer Low Strength 81 Mg Tbec (Aspirin) .[name]... Take 1 tablet by mouth once a day    Metformin Hcl 500 Mg Tabs (Metformin hcl) .[name]... Take 2 tablets  by mouth once daily    Glucotrol Xl 10 Mg Tb24 (Glipizide) .[name]... Take 1  tablet by mouth once a day    Januvia 100 Mg Tabs (Sitagliptin phosphate) ..... One at bedtime    Lisinopril 10 Mg Tabs (Lisinopril) .[name]... Take 1 tablet by mouth once a day  Problem # 2:  MUSCLE STRAIN, RIGHT BUTTOCK (ICD-848.8) Piriformis syndome/ mild sciatica.Marland Kitchenno current radiculopathy.  Start with heat, massage, gentle stretchings, OTC NSAIDs as needed. Follow up if not improving in 2 weeks.   Complete Medication List: 1)  Zetia 10 Mg Tabs (Ezetimibe) .... Take 1 tab by mouth at bedtime 2)  Lexapro 10 Mg Tabs (Escitalopram oxalate) .... Take 1 tablet by mouth two times a day 3)  Calcium 500/d 500-200 Mg-unit Tabs (Calcium carbonate-vitamin d) .... Take 1 tablet by mouth once a day 4)  Bayer Low Strength 81 Mg Tbec (Aspirin) .... Take 1 tablet by mouth once a day 5)  Zantac 150 Maximum Strength 150 Mg Tabs (Ranitidine hcl) .... Take 1 by mouth once a day 6)  Metformin Hcl 500 Mg Tabs (Metformin hcl) .... Take 2 tablets  by mouth once daily 7)  Glucotrol Xl 10 Mg Tb24  (Glipizide) .... Take 1  tablet by mouth once a day 8)  Multivitamins Tabs (Multiple vitamin) .... Take 1 tablet by mouth once a day 9)  Accu-chek Active Strp (Glucose blood) .... Test blood sugar two times a day 10)  Detrol La 4 Mg Xr24h-cap (Tolterodine tartrate) .... Take 1 tablet by mouth once a day 11)  Januvia 100 Mg Tabs (Sitagliptin phosphate) .... One at bedtime 12)  Pravachol 20 Mg Tabs (Pravastatin sodium) .[name].. 1 tab by mouth daily 13)  Lisinopril 10 Mg Tabs (Lisinopril) .... Take 1 tablet by mouth once a day  Lipid Assessment/Plan:      Based on NCEP/ATP III, the patient's risk factor category is "history of diabetes".  The patient's lipid goals are as follows: Total cholesterol goal is 200; LDL cholesterol goal is 100; HDL cholesterol goal is 40; Triglyceride goal is 150.  Her LDL cholesterol goal has been met.    Patient Instructions: 1)  Please schedule a follow-up appointment in 3 months  DM 2)  Fasting 3)  BMP prior to visit, ICD-9: 250.00 4)  Hepatic Panel prior to visit ICD-9:  5)  Lipid panel prior to visit ICD-9 :  6)  HgBA1c prior to visit  ICD-9:  7)  Change gluctrol to taking prior to breakfast to see if better control and less low blood sugar. 8)   Gentle stretching in right buttock, heat and advil as needed. 9)  Follow up in 2 weeks if not improving.   Current Allergies (reviewed today): ! PRAVACHOL * CORTISONE (HYDROCORTISONE)

## 2010-04-05 NOTE — Letter (Signed)
Summary: [facility]  [facility]   Imported By: Maryln Gottron 03/26/2010 14:06:08  _____________________________________________________________________  External Attachment:    Type:   Image     Comment:   External Document

## 2010-04-05 NOTE — Miscellaneous (Signed)
   Clinical Lists Changes  Observations: Added new observation of CREATININE: 0.9 mg/dL (52/84/1324 40:10) Added new observation of LDL: 98 mg/dL (27/25/3664 40:34) Added new observation of HDL: 42 mg/dL (74/25/9563 87:56) Added new observation of TRIGLYC TOT: 63 mg/dL (43/32/9518 84:16) Added new observation of CHOLESTEROL: 153 mg/dL (60/63/0160 10:93) Added new observation of HGBA1C: 6.8 % (09/06/2009 15:02)

## 2010-04-05 NOTE — Miscellaneous (Signed)
   Clinical Lists Changes  Observations: Added new observation of BG RANDOM: 110 mg/dL (16/12/9602 54:09) Added new observation of BG RANDOM: 152 mg/dL (81/19/1478 29:56) Added new observation of BG RANDOM: 98 mg/dL (21/30/8657 84:69) Added new observation of BG RANDOM: 128 mg/dL (62/95/2841 32:44) Added new observation of BG RANDOM: 125 mg/dL (03/06/7251 66:44)

## 2010-04-05 NOTE — Assessment & Plan Note (Signed)
Summary: 3 M F/U DLO   Vital Signs:  Patient profile:   65 year old female Height:      65.75 inches Weight:      224.6 pounds BMI:     36.66 Temp:     97.5 degrees F oral Pulse rate:   88 / minute Pulse rhythm:   regular BP sitting:   102 / 72  (left arm) Cuff size:   large  Vitals Entered By: Benny Lennert CMA Duncan Dull) (April 28, 2009 8:49 AM) 6  History of Present Illness: Chief complaint 3 month follow up  5 more lbs weight loss.   Walking 5-6 days a week.  Working on healthy lifestyle.    Lipid Management History:      Positive NCEP/ATP III risk factors include female age 84 years old or older and diabetes.  Negative NCEP/ATP III risk factors include non-tobacco-user status.        Her compliance with the TLC diet is fair.  She notes side effects from her lipid-lowering medication.  Comments include: lower leg pain.     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 Take 1 Tab By Mouth At Bedtime 2)  Lexapro 10 Mg  Tabs (Escitalopram Oxalate) .... Take 1 Tablet By Mouth Two Times A Day 3)  Calcium 500/d 500-200 Mg-Unit  Tabs (Calcium Carbonate-Vitamin D) .... Take 1 Tablet By Mouth Once A Day 4)  Bayer Low Strength 81 Mg  Tbec (Aspirin) .... Take 1 Tablet By Mouth Once A Day 5)  Actos 45 Mg Tabs (Pioglitazone Hcl) .... Take 1 Tablet By Mouth Once A Day 6)  Zantac 150 Maximum Strength 150 Mg Tabs (Ranitidine Hcl) .... Take 1 By Mouth Once A Day 7)  Metformin Hcl 500 Mg Tabs (Metformin Hcl) .... Take 2 Tablets  By Mouth Once Daily 8)  Glucotrol Xl 10 Mg Tb24  (Glipizide) .... Take 1/2  Tablet By Mouth Once A Day 9)  Multivitamins   Tabs (Multiple Vitamin) .... Take 1 Tablet By Mouth Once A Day 10)  Accu-Chek Active   Strp (Glucose Blood) .... Test Blood Sugar Two Times A Day 11)  Detrol La 4 Mg Xr24h-Cap (Tolterodine Tartrate) .... Take 1 Tablet By Mouth Once A Day 12)  Januvia 100 Mg Tabs (Sitagliptin Phosphate) .... One At Bedtime 13)  Pravachol 20 Mg Tabs (Pravastatin Sodium) .[name].. 1 Tab By Mouth Daily 14)  Lisinopril 10 Mg Tabs (Lisinopril) .... Take 1 Tablet By Mouth Once A Day  Allergies: 1)  ! Pravachol 2)  * Cortisone (Hydrocortisone)  Past History:  Past medical, surgical, family and social histories (including risk factors) reviewed, and no changes noted (except as noted below).  Past Medical History: Reviewed history from 04/14/2008 and no changes required. Current Problems:  ROTATOR CUFF SYNDROME, LEFT (ICD-726.10) URI (ICD-465.9) HEADACHE (ICD-784.0) STRESS INCONTINENCE (ICD-788.39) DIVERTICULOSIS, COLON (ICD-562.10) HEMORRHOIDS, INTERNAL (ICD-455.0) OBESITY, BMI 38 (ICD-278.00) DEPRESSION (ICD-311) HIATAL HERNIA/GERD (ICD-553.3) HYPERCHOLESTEROLEMIA (ICD-272.0) DM X 10 YEARS (ICD-250.00)    Past Surgical History: Reviewed history from 02/03/2009 and no changes required. 1989 TONSILLECTOMY 1972 HYSTERECTOMYpartial for  MENORRHAGIA 1995 CHOLECYSTECTOMY 1997 BREAST REDUCTION 05/2006 RIGHT RENAL CYST, STABLE, NO F/U   2009 stress ECHO...low risk 2010 cardiac cath...no CAD  Family History: Reviewed history from 06/11/2006 and no changes required. Father: DIED 65 TOB ABUSE, LUNG CA Mother: ALIVE 62 FALLOPIAN TUBE CA, ALZHEIMER'S, PULMONARY EMBOLISM Siblings: 1 BROTHER DM               1 SISTER HTN PGM AND PGF:  TIA'S, CVA CV:  (-) MI <65  Social History: Reviewed history from 06/11/2006 and no changes required. 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 FAST FOOD, SALADS, LEAN MEAT Never Smoked Alcohol use-no Drug use-no  Review of Systems General:  Denies fatigue and fever. CV:  Denies chest pain or discomfort and swelling of feet. Resp:  Denies shortness of breath. GI:  Denies abdominal pain. GU:  Denies dysuria.  Physical Exam  General:  Well-developed,well-nourished,in no acute distress; alert,appropriate and cooperative throughout examination Mouth:  MMM Neck:  no carotid bruit or thyromegaly no cervical or supraclavicular lymphadenopathy  Lungs:  normal respiratory effort.   Heart:  Normal rate and regular rhythm. S1 and S2 normal without gallop, murmur, click, rub or other extra sounds. Abdomen:  soft, non-tender, no distention, no masses, no guarding, and no rigidity.   Pulses:  R and L posterior tibial pulses are full and equal bilaterally  Extremities:  no edema   Diabetes Management Exam:    Foot Exam (with socks and/or shoes not present):       Sensory-Pinprick/Light touch:          Left medial foot (L-4): normal          Left dorsal foot (L-5): normal          Left lateral foot (S-1): normal          Right medial foot (L-4): normal          Right dorsal foot (L-5): normal          Right lateral foot (S-1): normal       Sensory-Monofilament:          Left foot: normal          Right foot: normal       Inspection:          Left foot: normal          Right foot: normal       Nails:          Left foot: normal          Right foot: normal   Impression & Recommendations:  Problem # 1:  HYPERCHOLESTEROLEMIA (ICD-272.0) Increase LDL...but stopped pravastatin due to SE. Will start back at 1/2 tab daily or few times a week. Recheck fasting lipids  in 3 months Dx 272.0    Her updated medication list for this problem includes:    Zetia 10 Mg Tabs (Ezetimibe) .[name]... Take 1 tab by mouth at bedtime    Pravachol 20 Mg Tabs (Pravastatin sodium) .[name]... 1 tab by mouth daily  Problem # 2:   DM X 10 YEARS (ICD-250.00) Continuing to improve. Continue actos and [place]. Encouraged exercise, weight loss, healthy eating habits.  Her updated medication list for this problem includes:    Bayer Low Strength 81 Mg Tbec (Aspirin) .[name]... Take 1 tablet by mouth once a day    Actos 45 Mg Tabs (Pioglitazone hcl) .Marland KitchenMarland KitchenMarland KitchenMarland Kitchen  Take 1 tablet by mouth once a day    Metformin Hcl 500 Mg Tabs (Metformin hcl) .[name]... Take 2 tablets  by mouth once daily    Glucotrol Xl 10 Mg Tb24 (Glipizide) .[name]... Take 1/2  tablet by mouth once a day    Januvia 100 Mg Tabs (Sitagliptin phosphate) ..... One at bedtime    Lisinopril 10 Mg Tabs (Lisinopril) .[name]... Take 1 tablet by mouth once a day  Complete Medication List: 1)  Zetia 10 Mg Tabs (Ezetimibe) .... Take 1 tab by mouth at bedtime 2)  Lexapro 10 Mg Tabs (Escitalopram oxalate) .... Take 1 tablet by mouth two times a day 3)  Calcium 500/d 500-200 Mg-unit Tabs (Calcium carbonate-vitamin d) .... Take 1 tablet by mouth once a day 4)  Bayer Low Strength 81 Mg Tbec (Aspirin) .... Take 1 tablet by mouth once a day 5)  Actos 45 Mg Tabs (Pioglitazone hcl) .... Take 1 tablet by mouth once a day 6)  Zantac 150 Maximum Strength 150 Mg Tabs (Ranitidine hcl) .... Take 1 by mouth once a day 7)  Metformin Hcl 500 Mg Tabs (Metformin hcl) .... Take 2 tablets  by mouth once daily 8)  Glucotrol Xl 10 Mg Tb24 (Glipizide) .... Take 1/2  tablet by mouth once a day 9)  Multivitamins Tabs (Multiple vitamin) .... Take 1 tablet by mouth once a day 10)  Accu-chek Active Strp (Glucose blood) .... Test blood sugar two times a day 11)  Detrol La 4 Mg Xr24h-cap (Tolterodine tartrate) .... Take 1 tablet by mouth once a day 12)  Januvia 100 Mg Tabs (Sitagliptin phosphate) .... One at bedtime 13)  Pravachol 20 Mg Tabs (Pravastatin sodium) .[name].. 1 tab by mouth daily 14)  Lisinopril 10 Mg Tabs (Lisinopril) .... Take 1 tablet by mouth once a day  Lipid Assessment/Plan:      Based on NCEP/ATP III, the  patient's risk factor category is "history of diabetes".  The patient's lipid goals are as follows: Total cholesterol goal is 200; LDL cholesterol goal is 100; HDL cholesterol goal is 40; Triglyceride goal is 150.  Her LDL cholesterol goal has not been met.    Patient Instructions: 1)  Start back pravastatin 1/2 tab by mouth daily or 1 tab 3 times a week. 2)  Lipid panel prior to visit ICD-9 : 272.0 3)  HgBA1c prior to visit  ICD-9: 250.00 4)  Please schedule a follow-up appointment in 3 months DM 5)  Try Kerasol for heels. Prescriptions: PRAVACHOL 20 MG TABS (PRAVASTATIN SODIUM) 1 tab by mouth daily  #30 x 11   Entered and Authorized by:   Kerby Nora MD   Signed by:   Kerby Nora MD on 04/28/2009   Method used:   Electronically to        [facility]* (retail)       [address]       [place], [place]  [address]       Ph: [phone]       Fax: [phone]   RxID:   [id number]   Current Allergies (reviewed today): ! PRAVACHOL * CORTISONE (HYDROCORTISONE)

## 2010-04-10 ENCOUNTER — Encounter (INDEPENDENT_AMBULATORY_CARE_PROVIDER_SITE_OTHER): Payer: PRIVATE HEALTH INSURANCE | Admitting: Family Medicine

## 2010-04-10 ENCOUNTER — Encounter: Payer: Self-pay | Admitting: Family Medicine

## 2010-04-10 DIAGNOSIS — M25519 Pain in unspecified shoulder: Secondary | ICD-10-CM | POA: Insufficient documentation

## 2010-04-10 DIAGNOSIS — M5430 Sciatica, unspecified side: Secondary | ICD-10-CM | POA: Insufficient documentation

## 2010-04-10 DIAGNOSIS — Z23 Encounter for immunization: Secondary | ICD-10-CM

## 2010-04-10 DIAGNOSIS — Z0000 Encounter for general adult medical examination without abnormal findings: Secondary | ICD-10-CM

## 2010-04-10 DIAGNOSIS — Z01419 Encounter for gynecological examination (general) (routine) without abnormal findings: Secondary | ICD-10-CM

## 2010-04-10 LAB — CONVERTED CEMR LAB

## 2010-04-10 LAB — HM DIABETES FOOT EXAM

## 2010-04-11 NOTE — Letter (Signed)
Summary: [facility]   Imported By: Sherian Rein 04/04/2010 08:52:40  _____________________________________________________________________  External Attachment:    Type:   Image     Comment:   External Document

## 2010-04-19 NOTE — Assessment & Plan Note (Signed)
Summary: cpe LFW   Vital Signs:  Patient profile:   65 year old female Height:      65.75 inches Weight:      212.50 pounds BMI:     34.68 Temp:     98.2 degrees F oral Pulse rate:   76 / minute Pulse rhythm:   regular BP sitting:   102 / 70  (left arm) Cuff size:   large  Vitals Entered By: Benny Lennert CMA Duncan Dull) (April 10, 2010 11:02 AM)  History of Present Illness: CC:  CPX  The patient is here for annual wellness exam and preventative care.    She has the following chronic issues:   DM, improved control on Lantus 13 Units daily as well as metformin and januvia. 2 episodes of low 70, 40 FBS 118-139 in last few weeks.   No weight gain.. has been trying to stay to diet but many parties in last few months.  Hyperkalemia, resolved.  Depression: On lexapro, zanax as needed,  High cholesterol: poor control ...was previously controlled on zetia  (has been off for the last month due to cost) and pravachol  but now only taking about two times a week.   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 Take 1 Tab By Mouth At Bedtime 2)  Lexapro 10 Mg  Tabs (Escitalopram Oxalate) .... Take 1 Tablet By Mouth Two Times A Day 3)  Calcium 500/d 500-200 Mg-Unit  Tabs (Calcium Carbonate-Vitamin D) .... Take 1 Tablet By Mouth Once A Day 4)  Bayer Low Strength 81 Mg  Tbec (Aspirin) .... Take 1 Tablet By Mouth Once A Day 5)  Zantac 150 Maximum Strength  150 Mg Tabs (Ranitidine Hcl) .... Take 1 By Mouth Once A Day 6)  Metformin Hcl 500 Mg Tabs (Metformin Hcl) .... Take 2 Tablets  By Mouth Once Daily 7)  Multivitamins   Tabs (Multiple Vitamin) .... Take 1 Tablet By Mouth Once A Day 8)  Accu-Chek Active   Strp (Glucose Blood) .... Test Blood Sugar Two Times A Day 9)  Januvia 100 Mg Tabs (Sitagliptin Phosphate) .... One At Bedtime 10)  Pravachol 20 Mg Tabs (Pravastatin Sodium) .[name].. 1 Tab By Mouth Daily 11)  Lisinopril 10 Mg Tabs (Lisinopril) .... Take 1 Tablet By Mouth Once A Day 12)  Alprazolam 0.25 Mg Tabs (Alprazolam) .[name].. 1 Tab By Mouth Daily As Needed Anxiety 13)  Lantus Solostar 100 Unit/ml Soln (Insulin Glargine) .[name].. 12 Units Daily.Marland Kitchentitrate Up Insulin Every 2 Days Until Fasting Blood Sugars Are <120 14)  Pen Needles For Solostar .... Size of Choice  Administer Daily  Dx 250.00  Allergies (verified): 1)  ! Pravachol 2)  * Cortisone (Hydrocortisone)  Past History:  Past medical, surgical, family and social histories (including risk factors) reviewed, and no changes noted (except as noted below).  Past Medical History: Reviewed history from 04/14/2008 and no changes required. Current Problems:  ROTATOR CUFF SYNDROME, LEFT (ICD-726.10) URI (ICD-465.9) HEADACHE (  ICD-784.0) STRESS INCONTINENCE (ICD-788.39) DIVERTICULOSIS, COLON (ICD-562.10) HEMORRHOIDS, INTERNAL (ICD-455.0) OBESITY, BMI 38 (ICD-278.00) DEPRESSION (ICD-311) HIATAL HERNIA/GERD (ICD-553.3) HYPERCHOLESTEROLEMIA (ICD-272.0) DM X 10 YEARS (ICD-250.00)    Past Surgical History: Reviewed history from 02/03/2009 and no changes required. 1989 TONSILLECTOMY 1972 HYSTERECTOMYpartial for MENORRHAGIA 1995 CHOLECYSTECTOMY 1997 BREAST REDUCTION 05/2006 RIGHT RENAL CYST, STABLE, NO F/U   2009 stress ECHO...low risk 2010 cardiac cath...no CAD  Family History: Reviewed history from 06/11/2006 and no changes required. Father: DIED 65 TOB ABUSE, LUNG CA Mother: ALIVE 27  FALLOPIAN TUBE CA, ALZHEIMER'S, PULMONARY EMBOLISM Siblings: 1 BROTHER DM               1 SISTER HTN PGM AND PGF:  TIA'S, CVA CV:  (-) MI <65  Social History: Reviewed history from 06/11/2006 and no changes required. 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 FAST FOOD, SALADS, LEAN MEAT Never Smoked Alcohol use-no Drug use-no  Review of Systems General:  Denies fatigue and fever. CV:  Denies chest pain or discomfort. GI:  Denies abdominal pain. MS:  right buttock pain intermittatn x several months no falls, n weakness, no tingling.  no low back pain some right shoulder pain with reaching or int rotation. advil helps in both areas.  Physical Exam  General:  Overwewight female in NAD Ears:  no external deformities.  TMs clera bilaterally Nose:  no external deformity.   Mouth:  MMM Neck:  no carotid bruit or thyromegaly no cervical or supraclavicular lymphadenopathy  Chest Wall:  No deformities, masses, or tenderness noted. Breasts:  No mass, nodules, thickening, tenderness, bulging, retraction, inflamation, nipple discharge or skin changes noted.   Lungs:  Normal respiratory effort, chest expands symmetrically. Lungs are clear to auscultation, no crackles or wheezes. Heart:  Normal rate and regular rhythm. S1 and S2 normal without gallop, murmur, click, rub or other extra sounds. Abdomen:  Bowel sounds positive,abdomen soft and non-tender without masses, organomegaly or hernias noted. Genitalia:  normal introitus, no external lesions, and no adnexal masses or tenderness.   No uterus. No pap performed.  Msk:  right shoulder.. ttp subacromially neg drop arm, pos impingement sign  right buttock ttp at sciatic notch, neg SLR, mild pos faber's Pulses:  2+ rad pulses Extremities:  no pedal edema Neurologic:  No cranial nerve deficits noted. Station and gait are normal. Plantar reflexes  are down-going bilaterally. DTRs are symmetrical throughout. Sensory, motor and coordinative functions appear intact.  Diabetes Management Exam:    Foot Exam (with socks and/or shoes not present):       Sensory-Pinprick/Light touch:          Left medial foot (L-4): normal          Left dorsal foot (L-5): normal          Left lateral foot (S-1): normal          Right medial foot (L-4): normal          Right dorsal foot (L-5): normal          Right lateral foot (S-1): normal       Sensory-Monofilament:          Left foot: normal          Right foot: normal       Inspection:          Left foot: normal          Right foot:  normal       Nails:          Left foot: normal          Right foot: normal   Impression & Recommendations:  Problem # 1:  PHYSICAL EXAMINATION (ICD-V70.0) The patient's preventative maintenance and recommended screening tests for an annual wellness exam were reviewed in full today. Brought up to date unless services declined.  Counselled on the importance of diet, exercise, and its role in overall health and mortality. The patient's FH and SH was reviewed, including their home life, tobacco status, and drug and alcohol status.     Problem # 2:  ROUTINE GYNECOLOGICAL EXAMINATION (ICD-V72.31) DVE no pap.  Problem # 3:  HYPERCHOLESTEROLEMIA (ICD-272.0) previously well controlled .. try to increase as tolerated on pravachol, get back on zetia... pt willing to try this course.  Welchol off her formulary Her updated medication list for this problem includes:    Zetia 10 Mg Tabs (Ezetimibe) .[name]... Take 1 tab by mouth at bedtime    Pravachol 20 Mg Tabs (Pravastatin sodium) .[name]... 1 tab by mouth daily  Problem # 4:  DM X 10 YEARS (ICD-250.00) Improving control on insulin. Increase 1 unit . Eat regualr meals.[name] get back on track with diet changes. Continue exercsie.  Her updated medication list for this problem includes:    Bayer Low Strength 81 Mg Tbec (Aspirin) .[name]...  Take 1 tablet by mouth once a day    Metformin Hcl 500 Mg Tabs (Metformin hcl) .[name]... Take 2 tablets  by mouth once daily    Januvia 100 Mg Tabs (Sitagliptin phosphate) ..... One at bedtime    Lisinopril 10 Mg Tabs (Lisinopril) .[name]... Take 1 tablet by mouth once a day    Lantus Solostar 100 Unit/ml Soln (Insulin glargine) .[name]... 14 units daily.Marland Kitchentitrate up insulin every 1-2 days until fasting blood sugars are <120  Problem # 5:  SCIATICA, RIGHT (ICD-724.3) Piriformis syndrome, sciatic irritation. NSAIDs, info and stretches given. Ice. Follow up if not improving in 2 weeks.  Her updated medication list for this problem includes:    Bayer Low Strength 81 Mg Tbec (Aspirin) .[name]... Take 1 tablet by mouth once a day  Problem # 6:  SHOULDER PAIN, RIGHT (ICD-719.41) Info and stretches given. NSAIDs as needed.  Her updated medication list for this problem includes:    Bayer Low Strength 81 Mg Tbec (Aspirin) .[name]... Take 1 tablet by mouth once a day  Complete Medication List: 1)  Zetia 10 Mg Tabs (Ezetimibe) .... Take 1 tab by mouth at bedtime 2)  Lexapro 10 Mg Tabs (Escitalopram oxalate) .... Take 1 tablet by mouth two times a day 3)  Calcium 500/d 500-200 Mg-unit Tabs (Calcium carbonate-vitamin d) .... Take 1 tablet by mouth once a day 4)  Bayer Low Strength 81 Mg Tbec (Aspirin) .... Take 1 tablet by mouth once a day 5)  Zantac 150 Maximum Strength 150 Mg Tabs (Ranitidine hcl) .... Take 1 by mouth once a day 6)  Metformin Hcl 500 Mg Tabs (Metformin hcl) .... Take 2 tablets  by mouth once daily 7)  Multivitamins Tabs (Multiple vitamin) .... Take 1 tablet by mouth once a day 8)  Accu-chek Active Strp (Glucose blood) .... Test blood sugar two times a day 9)  Januvia 100 Mg Tabs (Sitagliptin phosphate) .... One at bedtime 10)  Pravachol 20 Mg Tabs (Pravastatin sodium) .[name].. 1 tab by mouth daily 11)  Lisinopril 10 Mg Tabs (Lisinopril) .... Take 1 tablet  by mouth once a day 12)  Alprazolam 0.25 Mg Tabs  (Alprazolam) .[name].. 1 tab by mouth daily as needed anxiety 13)  Lantus Solostar 100 Unit/ml Soln (Insulin glargine) .[name].. 14 units daily.Marland Kitchentitrate up insulin every 1-2 days until fasting blood sugars are <120 14)  Pen Needles For Solostar  .... Size of choice  administer daily  dx 250.00  Other Orders: Tdap => 49yrs IM (44034) Admin 1st Vaccine (74259)  Patient Instructions: 1)  Increase lantus to 14 Units daily. 2)   Work on trying to increase zetia and pravachol as tolerated 3)  Please schedule a follow-up appointment in 3 months .  4)  BMP prior to visit, ICD-9: 250.00 5)  Hepatic Panel prior to visit ICD-9:  6)  Lipid panel prior to visit ICD-9 :  7)  HgBA1c prior to visit  ICD-9:  8)   Call insuracne about shingles vaccine. 9)   Ice shoulder and buttock, Advil as needed, stretching exercises. 10)   Follow up if areas not improving.    Orders Added: 1)  Est. Patient 40-64 years [99396] 2)  Tdap => 35yrs IM [90715] 3)  Admin 1st Vaccine [[id number]]   Immunizations Administered:  Tetanus Vaccine:    Vaccine Type: Tdap    Site: left deltoid    Mfr: GlaxoSmithKline    Dose: 0.5 ml    Route: IM    Given by: Benny Lennert CMA (AAMA)    Exp. Date: 12/22/2011    Lot #: [id number]    VIS given: 01/20/08 version given April 10, 2010.   Immunizations Administered:  Tetanus Vaccine:    Vaccine Type: Tdap    Site: left deltoid    Mfr: GlaxoSmithKline    Dose: 0.5 ml    Route: IM    Given by: Benny Lennert CMA (AAMA)    Exp. Date: 12/22/2011    Lot #: [id number]    VIS given: 01/20/08 version given April 10, 2010.   Current Allergies (reviewed today): ! PRAVACHOL * CORTISONE (HYDROCORTISONE) Last PAP:  normal DVE (05/02/2009 11:22:04 AM) PAP Result Date:  04/10/2010 PAP Result:  DVE no pap needed, ovaries only PAP Next Due:  1 yr     Past Medical History:    Reviewed history from 04/14/2008 and no changes required:       Current Problems:        ROTATOR CUFF  SYNDROME, LEFT (ICD-726.10)       URI (ICD-465.9)       HEADACHE (ICD-784.0)       STRESS INCONTINENCE (ICD-788.39)       DIVERTICULOSIS, COLON (ICD-562.10)       HEMORRHOIDS, INTERNAL (ICD-455.0)       OBESITY, BMI 38 (ICD-278.00)       DEPRESSION (ICD-311)       HIATAL HERNIA/GERD (ICD-553.3)       HYPERCHOLESTEROLEMIA (ICD-272.0)       DM X 10 YEARS (ICD-250.00)          Past Surgical History:    Reviewed history from 02/03/2009 and no changes required:       1989 TONSILLECTOMY       1972 HYSTERECTOMYpartial for MENORRHAGIA       1995 CHOLECYSTECTOMY       1997 BREAST REDUCTION       05/2006 RIGHT RENAL CYST, STABLE, NO F/U         2009 stress ECHO...low risk       2010 cardiac cath...no CAD  Appended Document: cpe LFW Will get DXA results from [facility] imaging.. ? whther this year or last.. per pt nml results.

## 2010-05-07 ENCOUNTER — Encounter: Payer: Self-pay | Admitting: Family Medicine

## 2010-05-15 NOTE — Letter (Signed)
Summary: [facility]  [facility]   Imported By: Maryln Gottron 05/10/2010 15:18:57  _____________________________________________________________________  External Attachment:    Type:   Image     Comment:   External Document

## 2010-07-06 ENCOUNTER — Other Ambulatory Visit: Payer: Self-pay | Admitting: Family Medicine

## 2010-07-06 DIAGNOSIS — E7800 Pure hypercholesterolemia, unspecified: Secondary | ICD-10-CM

## 2010-07-10 ENCOUNTER — Other Ambulatory Visit (INDEPENDENT_AMBULATORY_CARE_PROVIDER_SITE_OTHER): Payer: PRIVATE HEALTH INSURANCE | Admitting: Family Medicine

## 2010-07-10 DIAGNOSIS — E7800 Pure hypercholesterolemia, unspecified: Secondary | ICD-10-CM

## 2010-07-10 DIAGNOSIS — E119 Type 2 diabetes mellitus without complications: Secondary | ICD-10-CM

## 2010-07-10 LAB — HEPATIC FUNCTION PANEL
ALT: 15 U/L (ref 0–35)
AST: 14 U/L (ref 0–37)
Albumin: 3.5 g/dL (ref 3.5–5.2)
Alkaline Phosphatase: 52 U/L (ref 39–117)
Bilirubin, Direct: 0 mg/dL (ref 0.0–0.3)
Total Bilirubin: 0.6 mg/dL (ref 0.3–1.2)
Total Protein: 6 g/dL (ref 6.0–8.3)

## 2010-07-10 LAB — BASIC METABOLIC PANEL WITH GFR
BUN: 15 mg/dL (ref 6–23)
CO2: 29 meq/L (ref 19–32)
Calcium: 8.8 mg/dL (ref 8.4–10.5)
Chloride: 107 meq/L (ref 96–112)
Creatinine, Ser: 0.8 mg/dL (ref 0.4–1.2)
Creatinine: 0.8 mg/dL (ref 0.5–1.1)
GFR: 79.2 mL/min
Glucose, Bld: 117 mg/dL — ABNORMAL HIGH (ref 70–99)
Potassium: 4.7 meq/L (ref 3.5–5.1)
Sodium: 141 meq/L (ref 135–145)

## 2010-07-10 LAB — LIPID PANEL
Cholesterol: 169 mg/dL (ref 0–200)
HDL: 36.5 mg/dL — ABNORMAL LOW
LDL Cholesterol: 119 mg/dL — ABNORMAL HIGH (ref 0–99)
Total CHOL/HDL Ratio: 5
Triglycerides: 66 mg/dL (ref 0.0–149.0)
Triglycerides: 66 mg/dL (ref 40–160)
VLDL: 13.2 mg/dL (ref 0.0–40.0)

## 2010-07-10 LAB — HEMOGLOBIN A1C: Hgb A1c MFr Bld: 8 % — AB (ref 4.0–6.0)

## 2010-07-11 ENCOUNTER — Other Ambulatory Visit: Payer: Self-pay | Admitting: *Deleted

## 2010-07-11 MED ORDER — SITAGLIPTIN PHOSPHATE 100 MG PO TABS: ORAL_TABLET | ORAL | Status: DC

## 2010-07-16 ENCOUNTER — Encounter: Payer: Self-pay | Admitting: Family Medicine

## 2010-07-17 ENCOUNTER — Encounter: Payer: Self-pay | Admitting: Family Medicine

## 2010-07-17 ENCOUNTER — Ambulatory Visit (INDEPENDENT_AMBULATORY_CARE_PROVIDER_SITE_OTHER): Payer: PRIVATE HEALTH INSURANCE | Admitting: Family Medicine

## 2010-07-17 DIAGNOSIS — D509 Iron deficiency anemia, unspecified: Secondary | ICD-10-CM

## 2010-07-17 DIAGNOSIS — E611 Iron deficiency: Secondary | ICD-10-CM

## 2010-07-17 DIAGNOSIS — E7800 Pure hypercholesterolemia, unspecified: Secondary | ICD-10-CM

## 2010-07-17 DIAGNOSIS — E119 Type 2 diabetes mellitus without complications: Secondary | ICD-10-CM

## 2010-07-17 MED ORDER — METFORMIN HCL 500 MG PO TABS: 1000.0000 mg | ORAL_TABLET | Freq: Every day | ORAL | Status: DC

## 2010-07-17 NOTE — Patient Instructions (Signed)
Continue work on diet and exercise. Consider checking 2 hours after meals. If fasting blood sugars are consistently above 120 increase insulin as directed.

## 2010-07-17 NOTE — Assessment & Plan Note (Signed)
Improved control but not at goal <100..the patient unwilling to increase pravachol to twice weekly at this time. She will continue to work on lifestyle changes.

## 2010-07-17 NOTE — Assessment & Plan Note (Signed)
Worsened control..the patient  Is shocked at the results...she has been on same medicaiton and diet/exercsie as when it was improved at last OV.  I suggested titrating lantus up some if needed and evaluating postprandials..the patient wishes to hold off on medication increase but will work on lifestyle even more and check CBGs after meals.

## 2010-07-17 NOTE — Assessment & Plan Note (Signed)
[name], [name] NO.:  [id number]   MEDICAL RECORD NO.:  [id number]          PATIENT TYPE:  POB   LOCATION:  [facility] at [facility]         FACILITY:  [facility]   PHYSICIAN:  Ginger Carne, MD DATE OF BIRTH:  June 11, 1945   DATE OF SERVICE:  07/16/2007                                  CLINIC NOTE   HISTORY OF PRESENT ILLNESS:  Ms. Dorning returns today for routine  gynecological examination.  She has no specific complaints.  She does  have occasional symptoms of urgency, but these are not debilitating or  affecting her quality of life.  We discussed various options in terms of  reducing caffeinated products.  She had a colonoscopy 10 years ago and  is due for another one and will call us back when she is ready to have  this scheduled.  She has no cardiac complaints and no gynecological  symptomatology.  Of note, she is not on hormone replacement therapy.  She will be scheduled for a screening mammogram.  Specifically she is on  Lexapro, Zetia, glyburide/metformin, Actos and Glucotrol, doses per her  current medication list in the chart.  Her physical examination is also  in the chart and noted to be normal.  Blood pressure was 116/75.  Her  weight is 234 pounds.  We discussed lifestyle issues regarding weight  loss and doing daily walks if possible.  Otherwise she has no specific  complaints and was she will return in 1 year for follow-up visit.           ______________________________  Ginger Carne, MD     SHB/MEDQ  D:  07/16/2007  T:  07/16/2007  Job:  [id number]

## 2010-07-17 NOTE — Progress Notes (Signed)
Diabetes: Worsened control in last 3 months. Last A1C 8.0 Using medications without difficulties: Januvia, Glucophage and Lantus 18 Units daily. Checking one to two times a day. Hypoglycemic episodes:None Hyperglycemic episodes:None Feet problems:None Blood Sugars averaging:FBS 117-145, 2 hour postprandial 126 eye exam within last year:Yes, no DR, mild cataracts. BP at goal <130/80 No chest pain, no SOB, feeling great  Walking  5 days a week.  Elevated Cholesterol: much improved since last OV.Marland Kitchenalmost at goal <100 On zetia daily and pravachol weekly. Using medications without problems: Muscle aches: None  Has history of iron deficiency.. Has not been taking her iron because if constipates her. She wishes to recheck iron stores etc.   PMH and SH reviewed.   Vital signs, Meds and allergies reviewed.  ROS: See HPI.  Otherwise nontributory.   GEN: nad, alert and oriented HEENT: mucous membranes moist NECK: supple w/o LA CV: rrr.  no murmur PULM: ctab, no inc wob ABD: soft, +bs EXT: no edema SKIN: no acute rash  Diabetic foot exam: Normal inspection No skin breakdown No calluses  Normal DP pulses Normal sensation to light tough and monofilament Nails normal

## 2010-07-17 NOTE — Assessment & Plan Note (Signed)
[name], [name] NO.:  [id number]   MEDICAL RECORD NO.:  [id number]          PATIENT TYPE:  POB   LOCATION:  [facility] at [facility]         FACILITY:  [facility]   PHYSICIAN:  Elsie Lincoln, MD      DATE OF BIRTH:  [birth year]   DATE OF SERVICE:  06/08/2009                                  CLINIC NOTE   HISTORY:  The patient is a 65 year old female who presents for her  yearly exam.  The patient has not been seen since 2009.  She usually  gets her health maintenance care from Dr. Kerby Nora at [facility].  The  patient is menopausal.  She has no problems.  She had a hysterectomy for  benign indications in 1972.  She still have her ovaries in situ.  She  still having urinary incontinence issues and going back to see Dr.  Laverle Patter.  The Detrol which did not work for her.   PAST MEDICAL HISTORY:  1. Diabetes.  2. Hypercholesterolemia.  3. Hemorrhoids.  4. Diverticulosis.  5. Depression.  6. Right kidney cyst.  7. Urinary incontinence.   PAST SURGICAL HISTORY:  Tonsils, hysterectomy, cholecystectomy, and  breast reduction.   FAMILY HISTORY:  Positive for diabetes, lung cancer in her father and  mother had fallopian tube cancer at age 63 and has had a blood clot in  the past.   SOCIAL HISTORY:  Lives with her husband.  Still denies all alcohol,  tobacco, and legal substances.   MEDICATIONS:  Lexapro, Zetia, glyburide and metformin combination,  Glucotrol, and Januvia.   ALLERGIES:  CORTISONE.  States that it causes her to stop breathing an  hour and no allergy to latex.   REVIEW OF SYSTEMS:  Her menopausal symptoms have stopped.  She still has  urinary incontinence symptoms.   PHYSICAL EXAMINATION:  VITAL SIGNS:  Pulse 80, blood pressure 117/77,  weight 217, and height 5 feet 5-1/2 inches.  GENERAL:  Well nourished, well developed, no apparent stress.  HEENT:  Normocephalic and atraumatic.  Good dentition.  THYROID:  No masses.  LUNGS:  Clear to auscultation  bilaterally.  HEART:  Regular rate and rhythm.  BREASTS:  No masses, nontender.  No lymphadenopathy.  ABDOMEN:  Soft, nontender.  No organomegaly.  No hernia.  There is  questionably some molluscum on her upper abdomen and lower chest.  The  patient will go to dermatologist to get a final diagnosis.  GENITALIA:  Tanner V.  Vagina, no evidence of atrophic vaginitis, cuff  intact, appears normal on speculum exam.  Bimanual, uterus surgically  absent.  Adnexa, ovaries nonpalpable.  No masses, nontender.  No  hemorrhoids seen or felt, no rectovaginal masses, no cystocele or  rectocele.  EXTREMITIES:  Nontender.  No edema.   ASSESSMENT AND PLAN:  A 65 year old female with well-woman exam.  1. Pap smear are necessary because the patient had a history of      hysterectomy for benign indications and does not have a history of      abnormal Pap smears.  The patient was not DES exposure in utero.  2. The patient to follow up with Dr. Konrad Dolores  Borden for urinary      incontinence.  3. The patient needs a DEXA.  We also reviewed calcium intake and      vitamin D intake and handouts were given.  4. Mammograms are followed by Dr. Ermalene Searing as well as colonoscopy.  5. Return to clinic in a year.           ______________________________  Elsie Lincoln, MD     KL/MEDQ  D:  06/08/2009  T:  06/08/2009  Job:  [id number]

## 2010-07-20 NOTE — Assessment & Plan Note (Signed)
[facility]                                 ON-CALL NOTE   NAME:SASSERMal King                         MRN:          [id number]  DATE:01/03/2008                            DOB:          December 28, 1945    A patient of Dr. Ermalene Searing.   Called from [phone].   She called at 9:47 a.m., on January 03, 2008.   Complaining of a sore throat, she saw Dr. Ermalene Searing last week, was told to  take Claritin because her throat was red and she tried that, did get  better, but then yesterday flared up again and now she can hardly  swallow.  She is also taking DayQuil and ibuprofen for the sore throat  and it is not any better.  I recommended if she could not wait till  tomorrow that she should go to an urgent care today to be evaluated.     Lelon Perla, DO  Electronically Signed    Shawnie Dapper  DD: 01/03/2008  DT: 01/03/2008  Job #: [id number]   cc:   Kerby Nora, MD

## 2010-07-20 NOTE — Assessment & Plan Note (Signed)
[facility]                           [place] OFFICE NOTE   [name], [name]                         MRN:          [id number]  DATE:03/12/2006                            DOB:          1945/09/25    CHIEF COMPLAINT:  A 65 year old white female here to establish new  doctor.   HISTORY OF PRESENT ILLNESS:  The patient is here with the following  concerns:  1. Diabetes, poor control: She states that her fasting blood sugars      have been running between 140 and 190. When her diabetes was last      evaluated with a hemoglobin A1c last year, about 6 to 9 months ago,      her A1c was 8.something. She states that at that point in time, her      previous doctor placed her on her glyburide and metformin 5/500 two      tablets p.o. b.i.d. She states that she is fairly compliant with      this, although sometimes only takes one tablet a day secondary to      diarrhea. She has never had diabetes and nutrition counseling.  2. High cholesterol: This was diagnosed within the past few years. She      was recently begun on Zetia 10 mg daily. She states that at last      check her last cholesterol was close to where it should be.   REVIEW OF SYSTEMS:  No chest pain. No shortness of breath. Occasional  diarrhea with medications. No nausea, vomiting, constipation.   PAST MEDICAL HISTORY:  1. Diagnosed with diabetes in 1998.  2. Hypercholesterolemia.  3. Hiatal hernia and gastroesophageal reflux disease.  4. Depression.  5. Obesity.  6. Internal hemorrhoids.  7. Diverticulosis.  8. Stress incontinence.   HOSPITALIZATIONS, SURGERIES, AND PROCEDURES:  1. In 1989, tonsillectomy.  2. In 1972, partial hysterectomy for menorrhagia and benign reasons.  3. In 1995, cholecystectomy.  4. In 1997, breast reduction.  5. Mammogram August 2007, negative.  6. Colonoscopy polyps noted in 2005.   ALLERGIES:  CORTISONE CAUSING RASH.   MEDICATIONS:  1. Lexapro 10 mg  one tablet p.o. b.i.d.  2. Zetia 10 mg daily.  3. Glyburide/metformin 5/500 two tablets p.o. b.i.d.  4. Calcium and vitamin D.  5. Zantac p.r.n.   SOCIAL HISTORY:  No smoking. No alcohol. No drug use. 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 She has been married for 40  years. She has 2 children who are healthy and grown. She tries to walk 3  times a week at the mall and occasionally outside when it is warm. She  is trying to improve her diet by eating fruits, vegetables and lean  meats, but occasionally does have fast foods.   FAMILY HISTORY:  1. Father deceased at age 79 with tobacco abuse and lung cancer.  2. Mother alive at age 46 with unclear type of fallopian tube cancer      as well as Alzheimer's and pulmonary embolism.  3. Paternal grandfather and  paternal grandmother with TIAs and CVAs.  4. One brother with diabetes and one sister with hypertension.  5. There is no family history of heart attack before age 26. No other      type of cancer runs in the family.   PHYSICAL EXAMINATION:  VITALS:  65-3/4 inches, weight 215 making BMI  approximately 38. Blood pressure is 114/70, pulse 85, temperature 98.3.  GENERAL: Obese-appearing female in no apparent distress.  HEENT: PERRLA. Extra-ocular muscles intact. Oropharynx clear. Tympanic  membranes clear. Nares clear. No thyromegaly. No lymphadenopathy-  supraclavicular or cervical.  CARDIOVASCULAR: Regular rate and rhythm. No murmurs, rubs or gallops.  Normal PMI. 2+ peripheral pulses. No peripheral edema.  PULMONARY: Clear to auscultation bilaterally. No wheezes, rales or  rhonchi.  ABDOMEN: Obese, soft and nontender. Normoactive bowel sounds. No  hepatosplenomegaly.  MUSCULOSKELETAL: Strength 5/5 in upper and lower extremities.  NEURO: Alert and oriented x3. Cranial nerves II-XII grossly intact.  Normal monofilament tests bilaterally.   ASSESSMENT/PLAN:  1. Diabetes, poor control: Given her fasting blood  sugars, her      hemoglobin A1c is likely elevated. We did discuss her side effects      of diarrhea from her glyburide/metformin combination. She may note      decreased symptoms if we try a long-acting metformin. I will begin      by referring her to diabetes and nutrition counseling. I will also      request records from her previous doctor to obtain the last      hemoglobin A1c. She was given information on guidelines for care      for diabetes. She has had an eye examination this year. She is      unsure as to when her cholesterol last was checked. She is unsure      as to when kidney function or protein in the urine was evaluated.      These are all likely due and if there is no record of these we can      obtain them within the next few weeks.  2. High cholesterol: She states that she was previously well-      controlled on Zetia. With reviewing records, depending on when this      was last evaluated, we can consider checking it. She is also      possibly due for a liver function test.  3. Prevention: She is up-to-date with mammogram. She does not require      pap smears secondary to partial hysterectomy. She is up-to-date      with colon cancer screening. She is unclear as to when tetanus was      last given. We did extensively discuss weight loss goals,      proper nutrition and increasing exercise. She was given information      on how to safely work on weight loss.     Kerby Nora, MD  Electronically Signed    AB/MedQ  DD: 03/12/2006  DT: 03/12/2006  Job #: [id number]   cc:   Lesly Dukes, M.D.

## 2010-07-20 NOTE — Assessment & Plan Note (Signed)
NAMEMAKENSEY, REGO NO.:  [id number]   MEDICAL RECORD NO.:  [id number]          PATIENT TYPE:  POB   LOCATION:  [facility]         FACILITY:  [facility]   PHYSICIAN:  Elsie Lincoln, MD      DATE OF BIRTH:  1945/09/07   DATE OF SERVICE:  03/06/2006                                  CLINIC NOTE   HISTORY OF PRESENT ILLNESS:  The patient is a 65 year old female,  menopausal, who presents for annual exam.  There are multiple issues to  be addressed today and they will be outlined below.  She did have a  hysterectomy in 1972 for benign reasons.  She had very heavy periods and  that was what Dr. Iran Planas had done back in 1972.  She has never had an  abnormal Pap smear.  She does not currently get Pap smears nor does she  need them.  She updated her mammogram which was in August 2007.  She had  a colonoscopy in 2005 which showed diverticulosis of the sigmoid, one  hemorrhoid and two benign polyps.  She does have mild stress urinary  incontinence, however she has never even tried Kegel's and would like to  try those today.  She does say that she has had some increased bloating  and some change in her bowel habits.  She does note this is due to the  metformin.  Given her body habitus, will order an ultrasound to evaluate  her ovaries.   PAST MEDICAL HISTORY:  1. Diabetes.  2. Hypercholesterolemia.  3. Hemorrhoids.  4. Diverticulosis.  5. Depression.   PAST SURGICAL HISTORY:  1. Tonsils in 1989.  2. Hysterectomy in 1972.  3. Cholecystectomy in 1995.  4. Breast reduction in 1999.   FAMILY HISTORY:  Positive for diabetes brother and grandparents, lung  cancer in her father and aunt and mother has had a blood clot before.   SOCIAL HISTORY:  She lives with her husband.  She drinks two caffeinated  beverages a week and denies all alcohol, tobacco and illegal substances.   MEDICATIONS:  1. Lexapro.  2. Zetia.  3. Glyburide/metformin combination tablet.   REVIEW OF SYSTEMS:  Positive for fatigue, weight gain, problems with  vision, loss of urine with coughing, sneezing and hot flashes.   PHYSICAL EXAMINATION:  GENERAL:  Well-nourished, well-developed in no  apparent distress.  VITAL SIGNS:  Pulse 88, blood pressure 120/82, weight 216.  HEENT:  Normocephalic and atraumatic.  NECK:  Thyroid no masses.  LUNGS:  Clear to auscultation bilaterally.  HEART:  Regular rate and rhythm.  ABDOMEN:  Obese, nontender, difficult to palpate secondary to habitus.  No hernia or organomegaly noted.  EXTREMITIES:  Nontender.  No swelling.  GENITALIA:  Tanner 5 vagina, atrophic with some prolapse at the vaginal  apex.  Urethra well supported.  Bladder slight cystocele.  She does also  have mild rectocele.  She has good sphincter tone.  There was no blood  or discharge in her vagina.  On her buttocks, there is a left mobile  cyst, probably a lipoma, however she wants this investigated by a  general surgeon which we will  refer.   ASSESSMENT AND PLAN:  This is a 65 year old female for exam today:  1. Change in bowel habits and abdominal bloating.  Will get      transvaginal ultrasound to rule out ovarian pathology.  2. Refer to general surgery in [facility] and a copy of this note      needs to go there for left buttocks mass, most likely a lipoma.  3. Patient wants to change primary care providers.  Refer her to      [place] to either see Dr. Milinda Antis or Dr. Ermalene Searing.  4. Update on mammogram, but needs to use calcium supplementation and      we reviewed this in detail today.  5. Return to the clinic in 3 weeks for test results and make sure      consults were successfully accomplished.           ______________________________  Elsie Lincoln, MD     KL/MEDQ  D:  03/06/2006  T:  03/06/2006  Job:  [id number]   cc:   [facility]   Roxy Manns, MD   Kerby Nora, MD

## 2010-07-20 NOTE — Assessment & Plan Note (Signed)
[facility]                                 ON-CALL NOTE   NAME:SASSERMal King                         MRN:          [id number]  DATE:06/07/2006                            DOB:          [name]    TELEPHONE NUMBER:  [phone]   SUBJECTIVE:  Possible kidney infection.   ASSESSMENT AND PLAN:  Saturday clinic.     Emily Nora, MD  Electronically Signed    AB/MedQ  DD: 06/07/2006  DT: 06/08/2006  Job #: [id number]

## 2010-07-20 NOTE — Assessment & Plan Note (Signed)
NAMEREILYN, NELSON NO.:  [id number]   MEDICAL RECORD NO.:  [id number]          PATIENT TYPE:  POB   LOCATION:  [facility]         FACILITY:  [facility]   PHYSICIAN:  Elsie Lincoln, MD      DATE OF BIRTH:  Jul 11, 1945   DATE OF SERVICE:  03/27/2006                                  CLINIC NOTE   CLINIC NOTE   The patient is a 65 year old female who presents for results.  She had  some abdominal bloating which could be due to metformin.  We did have an  ultrasound of her vagina and there was no pelvic or abnormal adnexal  masses identified and no fluid in the cul-de-sac.  The patient has a  history of a renal cyst and she has a 2 cm left lower pole mass in her  kidney.  She believes this has grown from the last.  She also has a 1.5  cm cyst in the lower pole of the right kidney.  The patient is going to  be referred to Urology.  I referred her to Crecencio Mc to get this  evaluated.  She is also seeing Dr. Dionicio Stall who is treating her diabetes.  She also has not started her calcium but she is going to today.  The  patient is to come back in 12 months.           ______________________________  Elsie Lincoln, MD     KL/MEDQ  D:  03/27/2006  T:  03/27/2006  Job:  [id number]

## 2010-08-14 ENCOUNTER — Other Ambulatory Visit: Payer: Self-pay | Admitting: *Deleted

## 2010-08-14 MED ORDER — SITAGLIPTIN PHOSPHATE 100 MG PO TABS: ORAL_TABLET | ORAL | Status: DC

## 2010-08-22 LAB — HM DIABETES EYE EXAM

## 2010-09-20 ENCOUNTER — Encounter: Payer: Self-pay | Admitting: Family Medicine

## 2010-09-24 ENCOUNTER — Encounter: Payer: Self-pay | Admitting: Family Medicine

## 2010-09-24 ENCOUNTER — Ambulatory Visit (INDEPENDENT_AMBULATORY_CARE_PROVIDER_SITE_OTHER): Payer: PRIVATE HEALTH INSURANCE | Admitting: Family Medicine

## 2010-09-24 DIAGNOSIS — J309 Allergic rhinitis, unspecified: Secondary | ICD-10-CM

## 2010-09-24 NOTE — Progress Notes (Signed)
SUBJECTIVE:  [name] is a 65 y.o. female who complains of congestion, sneezing and post nasal drip for 3 days.  She was at a friend's home who has animals and mold.  Never had a reaction like this in past.  She is unsure if she is allergic to pets. She denies a history of chest pain, fatigue, fevers, myalgias, nausea, shortness of breath, sweats, vomiting, weakness and wheezing and denies a history of asthma. Patient denies smoke cigarettes.   OBJECTIVE: BP 120/72  Pulse 81  Temp(Src) 98.2 F (36.8 C) (Oral)  Ht 5\' 5"  (1.651 m)  Wt 208 lb 4 oz (94.462 kg)  BMI 34.65 kg/m2  She appears well, vital signs are as noted. Ears normal.  Throat and pharynx normal.  Neck supple. No adenopathy in the neck. Nose is congested. Sinuses non tender. The chest is clear, without wheezes or rales.  Patient Active Problem List  Diagnoses  . HYPERCHOLESTEROLEMIA  . DEPRESSION  . HEMORRHOIDS, INTERNAL  . DIVERTICULOSIS, COLON  . SLEEP APNEA  . STRESS INCONTINENCE  . SHOULDER PAIN, RIGHT  . SCIATICA, RIGHT  . Diabetes mellitus  . Low iron   Past Medical History  Diagnosis Date  . Disorders of bursae and tendons in shoulder region, unspecified   . Acute upper respiratory infections of unspecified site   . Headache   . Other urinary incontinence   . Diverticulosis of colon (without mention of hemorrhage)   . Internal hemorrhoids without mention of complication   . Obesity, unspecified   . Depressive disorder, not elsewhere classified   . Diaphragmatic hernia without mention of obstruction or gangrene   . Pure hypercholesterolemia   . Diabetes mellitus without mention of complication    Past Surgical History  Procedure Date  . Tonsillectomy   . Abdominal hysterectomy   . Cholecystectomy   . Breast surgery     Reduction   History  Substance Use Topics  . Smoking status: Never Smoker   . Smokeless tobacco: Not on file  . Alcohol Use: No   Family History  Problem Relation Age of  Onset  . Cancer Mother     fullopian tube  . Dementia Mother   . Pulmonary embolism Mother   . Cancer Father     lung  . Hypertension Sister   . Diabetes Brother   . Stroke Paternal Grandmother   . Stroke Paternal Grandfather    Allergies  Allergen Reactions  . Cortisone   . Pravastatin Sodium     REACTION: leg pain   Current Outpatient Prescriptions on File Prior to Visit  Medication Sig Dispense Refill  . ALPRAZolam (XANAX) 0.25 MG tablet Take 0.25 mg by mouth daily as needed.        [name] aspirin 81 MG tablet Take 81 mg by mouth daily.        . B-D ULTRAFINE III SHORT PEN 31G X 8 MM MISC       . calcium-vitamin D (OSCAL WITH D) 500-200 MG-UNIT per tablet Take 1 tablet by mouth daily.        [name] escitalopram (LEXAPRO) 10 MG tablet Take 10 mg by mouth 2 (two) times daily.        [name] ezetimibe (ZETIA) 10 MG tablet Take 10 mg by mouth daily.        [name] glucose blood test strip 1 each by Other route 2 (two) times daily. Use as instructed       . insulin glargine (LANTUS SOLOSTAR)  100 UNIT/ML injection Inject 18 Units into the skin daily. Titrate up every 1-2 days until fasting blood sugar under 120      . lisinopril (PRINIVIL,ZESTRIL) 10 MG tablet Take 10 mg by mouth daily.        . metFORMIN (GLUCOPHAGE) 500 MG tablet Take 2 tablets (1,000 mg total) by mouth daily with breakfast.  60 tablet  11  . Multiple Vitamin (MULTIVITAMIN) tablet Take 1 tablet by mouth daily.        . pravastatin (PRAVACHOL) 20 MG tablet Take 20 mg by mouth once a week.       . ranitidine (ZANTAC) 150 MG tablet Take 150 mg by mouth daily.        . sitaGLIPtan (JANUVIA) 100 MG tablet Take one by mouth at bedtime  30 tablet  6  . TOVIAZ 8 MG TB24        The PMH, PSH, Social History, Family History, Medications, and allergies have been reviewed in [facility], and have been updated if relevant.  ASSESSMENT:  allergic rhinitis  PLAN: Symptomatic therapy suggested: push fluids, rest, use antihistamine-decongestant of choice  prn and return office visit prn if symptoms persist or worsen. Lack of antibiotic effectiveness discussed with her. Call or return to clinic prn if these symptoms worsen or fail to improve as anticipated.

## 2010-10-15 ENCOUNTER — Other Ambulatory Visit (INDEPENDENT_AMBULATORY_CARE_PROVIDER_SITE_OTHER): Payer: PRIVATE HEALTH INSURANCE

## 2010-10-15 DIAGNOSIS — E119 Type 2 diabetes mellitus without complications: Secondary | ICD-10-CM

## 2010-10-15 DIAGNOSIS — E7800 Pure hypercholesterolemia, unspecified: Secondary | ICD-10-CM

## 2010-10-15 DIAGNOSIS — D509 Iron deficiency anemia, unspecified: Secondary | ICD-10-CM

## 2010-10-15 DIAGNOSIS — E611 Iron deficiency: Secondary | ICD-10-CM

## 2010-10-15 LAB — LIPID PANEL
HDL: 48.3 mg/dL (ref >39.00)
Triglycerides: 84 mg/dL (ref 0.0–149.0)
VLDL: 16.8 mg/dL (ref 0.0–40.0)

## 2010-10-15 LAB — IBC PANEL: Iron: 71 ug/dL (ref 42–145)

## 2010-10-15 LAB — TRANSFERRIN: Transferrin: 264.6 mg/dL (ref 212.0–360.0)

## 2010-10-15 LAB — FERRITIN: Ferritin: 14.1 ng/mL (ref 10.0–291.0)

## 2010-10-15 LAB — HEMOGLOBIN A1C: Hgb A1c MFr Bld: 7.3 % — ABNORMAL HIGH (ref 4.6–6.5)

## 2010-10-19 ENCOUNTER — Ambulatory Visit: Payer: PRIVATE HEALTH INSURANCE | Admitting: Family Medicine

## 2010-10-22 ENCOUNTER — Ambulatory Visit (INDEPENDENT_AMBULATORY_CARE_PROVIDER_SITE_OTHER): Payer: PRIVATE HEALTH INSURANCE | Admitting: Family Medicine

## 2010-10-22 ENCOUNTER — Encounter: Payer: Self-pay | Admitting: Family Medicine

## 2010-10-22 DIAGNOSIS — D509 Iron deficiency anemia, unspecified: Secondary | ICD-10-CM

## 2010-10-22 DIAGNOSIS — E7800 Pure hypercholesterolemia, unspecified: Secondary | ICD-10-CM

## 2010-10-22 DIAGNOSIS — E119 Type 2 diabetes mellitus without complications: Secondary | ICD-10-CM

## 2010-10-22 DIAGNOSIS — E611 Iron deficiency: Secondary | ICD-10-CM

## 2010-10-22 LAB — HM DIABETES FOOT EXAM

## 2010-10-22 NOTE — Progress Notes (Signed)
  Subjective:    Patient ID: [name], female    DOB: Oct 21, 1945, 65 y.o.   MRN: [id number]  HPI  Diabetes: Improved control. On metformin, januvia and ALntus 18 UNit. Working on diet.[name] Poor exercise. Lab Results  Component Value Date   HGBA1C 7.3* 10/15/2010  Using medications without difficulties: Hypoglycemic episodes: None Hyperglycemic episodes:None Feet problems:None Blood Sugars averaging:114-129, not checking 2hr pp. eye exam within last year: yes.  Elevated Cholesterol:Worsened now off pravachol (was on 2 times a week) Using medications without problems:Leg cramps and pain... Resolved ff med. Muscle aches:  Other complaints:    Review of Systems  Constitutional: Negative for fever and fatigue.  HENT: Negative for ear pain.   Eyes: Negative for pain.  Respiratory: Negative for chest tightness and shortness of breath.   Cardiovascular: Negative for chest pain, palpitations and leg swelling.  Gastrointestinal: Negative for abdominal pain.  Genitourinary: Negative for dysuria.       Objective:   Physical Exam  Constitutional: Vital signs are normal. She appears well-developed and well-nourished. She is cooperative.  Non-toxic appearance. She does not appear ill. No distress.  HENT:  Head: Normocephalic.  Right Ear: Hearing, tympanic membrane, external ear and ear canal normal. Tympanic membrane is not erythematous, not retracted and not bulging.  Left Ear: Hearing, tympanic membrane, external ear and ear canal normal. Tympanic membrane is not erythematous, not retracted and not bulging.  Nose: No mucosal edema or rhinorrhea. Right sinus exhibits no maxillary sinus tenderness and no frontal sinus tenderness. Left sinus exhibits no maxillary sinus tenderness and no frontal sinus tenderness.  Mouth/Throat: Uvula is midline, oropharynx is clear and moist and mucous membranes are normal.  Eyes: Conjunctivae, EOM and lids are normal. Pupils are equal, round, and reactive  to light. No foreign bodies found.  Neck: Trachea normal and normal range of motion. Neck supple. Carotid bruit is not present. No mass and no thyromegaly present.  Cardiovascular: Normal rate, regular rhythm, S1 normal, S2 normal, normal heart sounds, intact distal pulses and normal pulses.  Exam reveals no gallop and no friction rub.   No murmur heard. Pulmonary/Chest: Effort normal and breath sounds normal. Not tachypneic. No respiratory distress. She has no decreased breath sounds. She has no wheezes. She has no rhonchi. She has no rales.  Abdominal: Soft. Normal appearance and bowel sounds are normal. There is no tenderness.  Neurological: She is alert.  Skin: Skin is warm, dry and intact. No rash noted.  Psychiatric: Her speech is normal and behavior is normal. Judgment and thought content normal. Her mood appears not anxious. Cognition and memory are normal. She does not exhibit a depressed mood.     Diabetic foot exam: Normal inspection No skin breakdown No calluses  Normal DP pulses Normal sensation to light touch and monofilament Nails normal      Assessment & Plan:

## 2010-10-22 NOTE — Assessment & Plan Note (Addendum)
In nml ranges today. Okay to remain off iron due to constipation.

## 2010-10-22 NOTE — Patient Instructions (Addendum)
Start red yeast rice 2400 mg daily. Start few times a week then gradually increase to daily if tolerated.  Start exercise.

## 2010-10-22 NOTE — Assessment & Plan Note (Signed)
Inadequate control, but cannot tolerate pravastatin. Will try trial of red yeast rice. Recheck in 3 months.

## 2010-10-22 NOTE — Assessment & Plan Note (Signed)
Improved control on current regimen. Continue but add exercsie and continue weight loss.

## 2010-11-12 ENCOUNTER — Other Ambulatory Visit: Payer: Self-pay

## 2010-11-12 NOTE — Telephone Encounter (Signed)
[facility] refill Escitalopram 10mg . Last filled 07/11/10.Please advise.

## 2010-11-14 MED ORDER — ESCITALOPRAM OXALATE 10 MG PO TABS: 10.0000 mg | ORAL_TABLET | Freq: Two times a day (BID) | ORAL | Status: DC

## 2011-01-03 ENCOUNTER — Ambulatory Visit (INDEPENDENT_AMBULATORY_CARE_PROVIDER_SITE_OTHER): Payer: PRIVATE HEALTH INSURANCE

## 2011-01-03 DIAGNOSIS — Z23 Encounter for immunization: Secondary | ICD-10-CM

## 2011-01-21 ENCOUNTER — Other Ambulatory Visit (INDEPENDENT_AMBULATORY_CARE_PROVIDER_SITE_OTHER): Payer: PRIVATE HEALTH INSURANCE

## 2011-01-21 DIAGNOSIS — E7800 Pure hypercholesterolemia, unspecified: Secondary | ICD-10-CM

## 2011-01-21 DIAGNOSIS — E119 Type 2 diabetes mellitus without complications: Secondary | ICD-10-CM

## 2011-01-21 LAB — COMPREHENSIVE METABOLIC PANEL
Albumin: 3.8 g/dL (ref 3.5–5.2)
BUN: 17 mg/dL (ref 6–23)
Calcium: 8.9 mg/dL (ref 8.4–10.5)
Chloride: 104 mEq/L (ref 96–112)
GFR: 56.73 mL/min — ABNORMAL LOW (ref >60.00)
Glucose, Bld: 110 mg/dL — ABNORMAL HIGH (ref 70–99)
Potassium: 4.6 mEq/L (ref 3.5–5.1)

## 2011-01-21 LAB — LIPID PANEL: Cholesterol: 178 mg/dL (ref 0–200)

## 2011-01-21 LAB — HEMOGLOBIN A1C: Hgb A1c MFr Bld: 7.4 % — ABNORMAL HIGH (ref 4.6–6.5)

## 2011-01-28 ENCOUNTER — Encounter: Payer: Self-pay | Admitting: Family Medicine

## 2011-01-28 ENCOUNTER — Ambulatory Visit (INDEPENDENT_AMBULATORY_CARE_PROVIDER_SITE_OTHER): Payer: PRIVATE HEALTH INSURANCE | Admitting: Family Medicine

## 2011-01-28 DIAGNOSIS — E7800 Pure hypercholesterolemia, unspecified: Secondary | ICD-10-CM

## 2011-01-28 DIAGNOSIS — E119 Type 2 diabetes mellitus without complications: Secondary | ICD-10-CM

## 2011-01-28 NOTE — Progress Notes (Signed)
  Subjective:    Patient ID: [name], female    DOB: 1946/02/23, 65 y.o.   MRN: [id number]  HPI  She reports 3 lb weight loss since last OV.   Diabetes:   On januvia, metformin, lantus 18 units. Lab Results  Component Value Date   HGBA1C 7.4* 01/21/2011  Using medications without difficulties: Hypoglycemic episodes:None Hyperglycemic episodes:None Feet problems:Not  Blood Sugars averaging:Checking 2 times a day. FBS 120-180, post prandial 150-170.  eye exam within last year:  BP at goal <130/80. She has not been sticking to diet in 3 months.. holidays , weddings.   Elevated Cholesterol: On zetia (did not tolerate pravastatin even low dose)  Has been doing off and on trial of red yeast rice: no SE. Only about 20 days total in last 3 months. Willing to try more consistently.  Diet compliance: Poor Exercise:Moderate. Other complaints:     Review of Systems  Constitutional: Negative for fever and fatigue.  HENT: Negative for ear pain.   Eyes: Negative for pain.  Respiratory: Negative for chest tightness and shortness of breath.   Cardiovascular: Negative for chest pain, palpitations and leg swelling.  Gastrointestinal: Negative for abdominal pain.  Genitourinary: Negative for dysuria.       Objective:   Physical Exam  Constitutional: Vital signs are normal. She appears well-developed and well-nourished. She is cooperative.  Non-toxic appearance. She does not appear ill. No distress.  HENT:  Head: Normocephalic.  Right Ear: Hearing, tympanic membrane, external ear and ear canal normal. Tympanic membrane is not erythematous, not retracted and not bulging.  Left Ear: Hearing, tympanic membrane, external ear and ear canal normal. Tympanic membrane is not erythematous, not retracted and not bulging.  Nose: No mucosal edema or rhinorrhea. Right sinus exhibits no maxillary sinus tenderness and no frontal sinus tenderness. Left sinus exhibits no maxillary sinus tenderness and  no frontal sinus tenderness.  Mouth/Throat: Uvula is midline, oropharynx is clear and moist and mucous membranes are normal.  Eyes: Conjunctivae, EOM and lids are normal. Pupils are equal, round, and reactive to light. No foreign bodies found.  Neck: Trachea normal and normal range of motion. Neck supple. Carotid bruit is not present. No mass and no thyromegaly present.  Cardiovascular: Normal rate, regular rhythm, S1 normal, S2 normal, normal heart sounds, intact distal pulses and normal pulses.  Exam reveals no gallop and no friction rub.   No murmur heard. Pulmonary/Chest: Effort normal and breath sounds normal. Not tachypneic. No respiratory distress. She has no decreased breath sounds. She has no wheezes. She has no rhonchi. She has no rales.  Abdominal: Soft. Normal appearance and bowel sounds are normal. There is no tenderness.  Neurological: She is alert.  Skin: Skin is warm, dry and intact. No rash noted.  Psychiatric: Her speech is normal and behavior is normal. Judgment and thought content normal. Her mood appears not anxious. Cognition and memory are normal. She does not exhibit a depressed mood.    Diabetic foot exam: Normal inspection No skin breakdown No calluses  Normal DP pulses Normal sensation to light touch and monofilament Nails normal       Assessment & Plan:

## 2011-01-28 NOTE — Patient Instructions (Addendum)
Increase lantus to 20 Units in AM... Goal fasting is 80-120. If fasting blood sugar not at goal... You can increase 2 Units to 22 Units of Lantus. Call if consistent blood sugars below 60. Do not decrease medication without letting us know. Continue other medications. Try red yeast rice 2400 mg daily to lower cholesterol.  Continue zetia.  Follow up in early May, with fasting labs prior.

## 2011-01-28 NOTE — Assessment & Plan Note (Addendum)
Improving control... LDL 118, not yet at goal. Will try trial of red yeast rice.. Once at goal may consider D/C ing Zetia.. Unclear how much this med is helping.

## 2011-01-28 NOTE — Assessment & Plan Note (Signed)
Worsened control with worsened diet.. Titrate up Lantus.. Start at 20 UNits can increase as able. Encouraged exercise, weight loss, healthy eating habits.

## 2011-02-11 ENCOUNTER — Other Ambulatory Visit: Payer: Self-pay | Admitting: *Deleted

## 2011-02-11 MED ORDER — INSULIN PEN NEEDLE 31G X 8 MM MISC: Status: DC

## 2011-03-11 ENCOUNTER — Other Ambulatory Visit: Payer: Self-pay | Admitting: *Deleted

## 2011-03-11 MED ORDER — SITAGLIPTIN PHOSPHATE 100 MG PO TABS: ORAL_TABLET | ORAL | Status: DC

## 2011-03-13 ENCOUNTER — Other Ambulatory Visit: Payer: Self-pay | Admitting: Family Medicine

## 2011-03-13 DIAGNOSIS — Z1231 Encounter for screening mammogram for malignant neoplasm of breast: Secondary | ICD-10-CM

## 2011-04-10 ENCOUNTER — Ambulatory Visit (HOSPITAL_COMMUNITY)
Admission: RE | Admit: 2011-04-10 | Discharge: 2011-04-10 | Disposition: A | Discharge status: Home / Self Care | Payer: BC Managed Care – PPO | Source: Ambulatory Visit | Attending: Family Medicine | Admitting: Family Medicine

## 2011-04-10 DIAGNOSIS — Z1231 Encounter for screening mammogram for malignant neoplasm of breast: Secondary | ICD-10-CM | POA: Insufficient documentation

## 2011-05-06 ENCOUNTER — Other Ambulatory Visit: Payer: Self-pay | Admitting: *Deleted

## 2011-05-06 MED ORDER — INSULIN GLARGINE 100 UNIT/ML ~~LOC~~ SOLN: 18.0000 [IU] | Freq: Every day | SUBCUTANEOUS | Status: DC

## 2011-07-02 ENCOUNTER — Encounter (HOSPITAL_COMMUNITY): Payer: Self-pay | Admitting: *Deleted

## 2011-07-02 ENCOUNTER — Emergency Department (HOSPITAL_COMMUNITY)
Admission: EM | Admit: 2011-07-02 | Discharge: 2011-07-03 | Disposition: A | Discharge status: Home / Self Care | Payer: BC Managed Care – PPO | Attending: Emergency Medicine | Admitting: Emergency Medicine

## 2011-07-02 DIAGNOSIS — Z7982 Long term (current) use of aspirin: Secondary | ICD-10-CM | POA: Insufficient documentation

## 2011-07-02 DIAGNOSIS — N390 Urinary tract infection, site not specified: Secondary | ICD-10-CM | POA: Insufficient documentation

## 2011-07-02 DIAGNOSIS — Z794 Long term (current) use of insulin: Secondary | ICD-10-CM | POA: Insufficient documentation

## 2011-07-02 DIAGNOSIS — F329 Major depressive disorder, single episode, unspecified: Secondary | ICD-10-CM | POA: Insufficient documentation

## 2011-07-02 DIAGNOSIS — R112 Nausea with vomiting, unspecified: Secondary | ICD-10-CM | POA: Insufficient documentation

## 2011-07-02 DIAGNOSIS — R1031 Right lower quadrant pain: Secondary | ICD-10-CM | POA: Insufficient documentation

## 2011-07-02 DIAGNOSIS — E7800 Pure hypercholesterolemia, unspecified: Secondary | ICD-10-CM | POA: Insufficient documentation

## 2011-07-02 DIAGNOSIS — Z79899 Other long term (current) drug therapy: Secondary | ICD-10-CM | POA: Insufficient documentation

## 2011-07-02 DIAGNOSIS — F3289 Other specified depressive episodes: Secondary | ICD-10-CM | POA: Insufficient documentation

## 2011-07-02 DIAGNOSIS — E119 Type 2 diabetes mellitus without complications: Secondary | ICD-10-CM | POA: Insufficient documentation

## 2011-07-02 LAB — DIFFERENTIAL
Basophils Absolute: 0 10*3/uL (ref 0.0–0.1)
Basophils Relative: 0 % (ref 0–1)
Neutro Abs: 7.7 10*3/uL (ref 1.7–7.7)
Neutrophils Relative %: 82 % — ABNORMAL HIGH (ref 43–77)

## 2011-07-02 LAB — COMPREHENSIVE METABOLIC PANEL
AST: 15 U/L (ref 0–37)
Albumin: 4 g/dL (ref 3.5–5.2)
Alkaline Phosphatase: 62 U/L (ref 39–117)
Chloride: 103 mEq/L (ref 96–112)
Creatinine, Ser: 0.91 mg/dL (ref 0.50–1.10)
Potassium: 4.4 mEq/L (ref 3.5–5.1)
Sodium: 138 mEq/L (ref 135–145)
Total Bilirubin: 0.2 mg/dL — ABNORMAL LOW (ref 0.3–1.2)

## 2011-07-02 LAB — CBC
MCHC: 33.1 g/dL (ref 30.0–36.0)
Platelets: 262 10*3/uL (ref 150–400)
RDW: 13.7 % (ref 11.5–15.5)

## 2011-07-02 MED ORDER — HYDROMORPHONE HCL PF 1 MG/ML IJ SOLN
1.0000 mg | Freq: Once | INTRAMUSCULAR | Status: AC
  Administered 2011-07-02: 1 mg via INTRAVENOUS
  Filled 2011-07-02: qty 1

## 2011-07-02 MED ORDER — IOHEXOL 300 MG/ML  SOLN
20.0000 mL | INTRAMUSCULAR | Status: AC
  Administered 2011-07-02: 20 mL via ORAL

## 2011-07-02 MED ORDER — ONDANSETRON HCL 4 MG/2ML IJ SOLN
4.0000 mg | Freq: Once | INTRAMUSCULAR | Status: AC
  Administered 2011-07-02: 4 mg via INTRAVENOUS
  Filled 2011-07-02: qty 2

## 2011-07-02 MED ORDER — SODIUM CHLORIDE 0.9 % IV SOLN
INTRAVENOUS | Status: DC
  Administered 2011-07-02: 125 mL/h via INTRAVENOUS

## 2011-07-02 NOTE — ED Notes (Signed)
Pt c/o RLQ abdominal pain since 5 pm today, associated with n/v.  Pain also goes to right lower back.  Denies urinary sx

## 2011-07-02 NOTE — ED Notes (Signed)
C/o 10/10 RLQ abd pain

## 2011-07-02 NOTE — ED Provider Notes (Signed)
History     CSN: [id number]  Arrival date & time 07/02/11  2115   First MD Initiated Contact with Patient 07/02/11 2244      Chief Complaint  Patient presents with  . Abdominal Pain    (Consider location/radiation/quality/duration/timing/severity/associated sxs/prior treatment) Patient is a 66 y.o. female presenting with abdominal pain. The history is provided by the patient. No language interpreter was used.  Abdominal Pain The primary symptoms of the illness include abdominal pain, nausea and vomiting. The primary symptoms of the illness do not include fever, fatigue, shortness of breath, diarrhea, hematemesis, hematochezia, dysuria, vaginal discharge or vaginal bleeding. Primary symptoms comment: s/p abd hyst The current episode started 6 to 12 hours ago. The onset of the illness was gradual. The problem has been gradually worsening.  The abdominal pain began 6 to 12 hours ago. The pain came on gradually. The abdominal pain has been gradually worsening since its onset. The abdominal pain is located in the RLQ. The abdominal pain radiates to the back. The abdominal pain is relieved by nothing. The abdominal pain is exacerbated by movement.  Nausea began today. The nausea is associated with eating. The nausea is exacerbated by food.  Vomiting occurred once. The emesis contains stomach contents.  Risk factors for an acute abdominal problem include being elderly and a history of abdominal surgery. Symptoms associated with the illness do not include chills, anorexia, constipation, urgency, frequency or back pain.    Past Medical History  Diagnosis Date  . Disorders of bursae and tendons in shoulder region, unspecified   . Acute upper respiratory infections of unspecified site   . Headache   . Other urinary incontinence   . Diverticulosis of colon (without mention of hemorrhage)   . Internal hemorrhoids without mention of complication   . Obesity, unspecified   . Depressive disorder,  not elsewhere classified   . Diaphragmatic hernia without mention of obstruction or gangrene   . Pure hypercholesterolemia   . Diabetes mellitus without mention of complication     Past Surgical History  Procedure Date  . Tonsillectomy   . Abdominal hysterectomy   . Cholecystectomy   . Breast surgery     Reduction    Family History  Problem Relation Age of Onset  . Cancer Mother     fullopian tube  . Dementia Mother   . Pulmonary embolism Mother   . Cancer Father     lung  . Hypertension Sister   . Diabetes Brother   . Stroke Paternal Grandmother   . Stroke Paternal Grandfather     History  Substance Use Topics  . Smoking status: Never Smoker   . Smokeless tobacco: Not on file  . Alcohol Use: No    OB History    Grav Para Term Preterm Abortions TAB SAB Ect Mult Living                  Review of Systems  Constitutional: Negative for fever, chills, activity change, appetite change and fatigue.  HENT: Negative for congestion, sore throat, rhinorrhea, neck pain and neck stiffness.   Respiratory: Negative for cough and shortness of breath.   Cardiovascular: Negative for chest pain and palpitations.  Gastrointestinal: Positive for nausea, vomiting and abdominal pain. Negative for diarrhea, constipation, hematochezia, anorexia and hematemesis.  Genitourinary: Positive for flank pain. Negative for dysuria, urgency, frequency, vaginal bleeding and vaginal discharge.  Musculoskeletal: Negative for myalgias, back pain and arthralgias.  Neurological: Negative for dizziness, weakness, light-headedness, numbness and  headaches.  All other systems reviewed and are negative.    Allergies  Cortisone and Pravastatin sodium  Home Medications   Current Outpatient Rx  Name Route Sig Dispense Refill  . ASPIRIN 81 MG PO TABS Oral Take 81 mg by mouth daily.      [name] CALCIUM CARBONATE-VITAMIN D 500-200 MG-UNIT PO TABS Oral Take 1 tablet by mouth daily.      [name] ESCITALOPRAM OXALATE  10 MG PO TABS Oral Take 10 mg by mouth 2 (two) times daily.    [name] EZETIMIBE 10 MG PO TABS Oral Take 10 mg by mouth daily.      . INSULIN GLARGINE 100 UNIT/ML [place] SOLN Subcutaneous Inject 18 Units into the skin daily.    [name] METFORMIN HCL ER 500 MG PO TB24 Oral Take 1,000 mg by mouth daily.     [name] ONE-DAILY MULTI VITAMINS PO TABS Oral Take 1 tablet by mouth daily.      [name] RANITIDINE HCL 150 MG PO TABS Oral Take 150 mg by mouth daily.      . RED YEAST RICE 600 MG PO CAPS Oral Take 2,400 mg by mouth daily.      [name] SITAGLIPTIN PHOSPHATE 100 MG PO TABS Oral Take 100 mg by mouth daily.    . TOVIAZ 8 MG PO TB24 Oral Take 8 mg by mouth daily.     [name] GLUCOSE BLOOD VI STRP Other 1 each by Other route 2 (two) times daily. Use as instructed     . INSULIN PEN NEEDLE 31G X 8 MM MISC  Use as directed Dx:250.00 100 each 6    BP 142/68  Pulse 75  Temp(Src) 98.3 F (36.8 C) (Oral)  Resp 16  SpO2 100%  Physical Exam  Nursing note and vitals reviewed. Constitutional: She is oriented to person, place, and time. She appears well-developed and well-nourished. No distress.  HENT:  Head: Normocephalic and atraumatic.  Mouth/Throat: Oropharynx is clear and moist.  Eyes: Conjunctivae and EOM are normal. Pupils are equal, round, and reactive to light.  Neck: Normal range of motion. Neck supple.  Cardiovascular: Normal rate, regular rhythm, normal heart sounds and intact distal pulses.  Exam reveals no gallop and no friction rub.   No murmur heard. Pulmonary/Chest: Effort normal and breath sounds normal. No respiratory distress. She exhibits no tenderness.  Abdominal: Soft. Bowel sounds are normal. There is tenderness (mcburney point). There is guarding (RLQ). There is no rebound.  Musculoskeletal: Normal range of motion. She exhibits no tenderness.  Neurological: She is alert and oriented to person, place, and time. No cranial nerve deficit.  Skin: Skin is warm and dry. No rash noted.    ED Course  Procedures  (including critical care time)  Labs Reviewed  DIFFERENTIAL - Abnormal; Notable for the following:    Neutrophils Relative 82 (*)    All other components within normal limits  COMPREHENSIVE METABOLIC PANEL - Abnormal; Notable for the following:    Glucose, Bld 148 (*)    Total Bilirubin 0.2 (*)    GFR calc non Af Amer 65 (*)    GFR calc Af Amer 76 (*)    All other components within normal limits  URINALYSIS, ROUTINE W REFLEX MICROSCOPIC - Abnormal; Notable for the following:    APPearance CLOUDY (*)    Nitrite POSITIVE (*)    Leukocytes, UA SMALL (*)    All other components within normal limits  URINE MICROSCOPIC-ADD ON - Abnormal; Notable for the following:  Squamous Epithelial / LPF FEW (*)    Bacteria, UA MANY (*)    All other components within normal limits  CBC  LIPASE, BLOOD  LACTIC ACID, PLASMA   No results found.   1. Abdominal pain       MDM  Abdominal pain with a concerning history and physical examination for acute appendicitis. Her white blood cell count is normal however she does have a left shift. She has a urine with nitrites and leukocyte esterase with 3-6 white blood cells. I will not treat this as a urinary tract infection until the CT is completed. She received IV fluids, antiemetics, pain medication. CT abdomen pelvis is pending at the time of this dictation. She will be discussed with my colleague [name] who will disposition the patient.        Dayton Bailiff, MD 07/03/11 [phone]

## 2011-07-03 ENCOUNTER — Emergency Department (HOSPITAL_COMMUNITY): Payer: BC Managed Care – PPO

## 2011-07-03 LAB — URINALYSIS, ROUTINE W REFLEX MICROSCOPIC
Glucose, UA: NEGATIVE mg/dL
Ketones, ur: NEGATIVE mg/dL
pH: 6 (ref 5.0–8.0)

## 2011-07-03 LAB — URINE MICROSCOPIC-ADD ON

## 2011-07-03 MED ORDER — IOHEXOL 300 MG/ML  SOLN
100.0000 mL | Freq: Once | INTRAMUSCULAR | Status: AC | PRN
  Administered 2011-07-03: 100 mL via INTRAVENOUS

## 2011-07-03 MED ORDER — CEPHALEXIN 500 MG PO CAPS: 500.0000 mg | ORAL_CAPSULE | Freq: Four times a day (QID) | ORAL | Status: AC

## 2011-07-03 MED ORDER — HYDROCODONE-ACETAMINOPHEN 5-325 MG PO TABS: ORAL_TABLET | ORAL | Status: AC

## 2011-07-03 MED ORDER — DEXTROSE 5 % IV SOLN
1.0000 g | Freq: Once | INTRAVENOUS | Status: AC
  Administered 2011-07-03: 1 g via INTRAVENOUS
  Filled 2011-07-03: qty 10

## 2011-07-03 NOTE — ED Notes (Signed)
Pt has begun to drink PO contrast in prep for Abd CT

## 2011-07-03 NOTE — ED Provider Notes (Signed)
66yo F, c/o RLQ abd pain since last night.  VSS, CTA, RRR, abd soft/NT, no vomiting/diarrhea while in ED.  WBC normal, afebrile, CT A/P without acute appy.  Tx IV rocephin and rx keflex for UTI.  UC added.  Pt feels better and wants to go home now.  Dx testing d/w pt and family.  Questions answered.  Verb understanding, agreeable to d/c home with outpt f/u.       Results for orders placed during the hospital encounter of 07/02/11  CBC      Component Value Range   WBC 9.3  4.0 - 10.5 (K/uL)   RBC 4.13  3.87 - 5.11 (MIL/uL)   Hemoglobin 12.0  12.0 - 15.0 (g/dL)   HCT 40.9  81.1 - 91.4 (%)   MCV 87.9  78.0 - 100.0 (fL)   MCH 29.1  26.0 - 34.0 (pg)   MCHC 33.1  30.0 - 36.0 (g/dL)   RDW 78.2  95.6 - 21.3 (%)   Platelets 262  150 - 400 (K/uL)  DIFFERENTIAL      Component Value Range   Neutrophils Relative 82 (*) 43 - 77 (%)   Neutro Abs 7.7  1.7 - 7.7 (K/uL)   Lymphocytes Relative 14  12 - 46 (%)   Lymphs Abs 1.3  0.7 - 4.0 (K/uL)   Monocytes Relative 4  3 - 12 (%)   Monocytes Absolute 0.3  0.1 - 1.0 (K/uL)   Eosinophils Relative 0  0 - 5 (%)   Eosinophils Absolute 0.0  0.0 - 0.7 (K/uL)   Basophils Relative 0  0 - 1 (%)   Basophils Absolute 0.0  0.0 - 0.1 (K/uL)  COMPREHENSIVE METABOLIC PANEL      Component Value Range   Sodium 138  135 - 145 (mEq/L)   Potassium 4.4  3.5 - 5.1 (mEq/L)   Chloride 103  96 - 112 (mEq/L)   CO2 25  19 - 32 (mEq/L)   Glucose, Bld 148 (*) 70 - 99 (mg/dL)   BUN 17  6 - 23 (mg/dL)   Creatinine, Ser 0.86  0.50 - 1.10 (mg/dL)   Calcium 9.3  8.4 - 57.8 (mg/dL)   Total Protein 6.9  6.0 - 8.3 (g/dL)   Albumin 4.0  3.5 - 5.2 (g/dL)   AST 15  0 - 37 (U/L)   ALT 12  0 - 35 (U/L)   Alkaline Phosphatase 62  39 - 117 (U/L)   Total Bilirubin 0.2 (*) 0.3 - 1.2 (mg/dL)   GFR calc non Af Amer 65 (*) >90 (mL/min)   GFR calc Af Amer 76 (*) >90 (mL/min)  LIPASE, BLOOD      Component Value Range   Lipase 35  11 - 59 (U/L)  URINALYSIS, ROUTINE W REFLEX MICROSCOPIC        Component Value Range   Color, Urine YELLOW  YELLOW    APPearance CLOUDY (*) CLEAR    Specific Gravity, Urine 1.015  1.005 - 1.030    pH 6.0  5.0 - 8.0    Glucose, UA NEGATIVE  NEGATIVE (mg/dL)   Hgb urine dipstick NEGATIVE  NEGATIVE    Bilirubin Urine NEGATIVE  NEGATIVE    Ketones, ur NEGATIVE  NEGATIVE (mg/dL)   Protein, ur NEGATIVE  NEGATIVE (mg/dL)   Urobilinogen, UA 0.2  0.0 - 1.0 (mg/dL)   Nitrite POSITIVE (*) NEGATIVE    Leukocytes, UA SMALL (*) NEGATIVE   LACTIC ACID, PLASMA      Component Value  Range   Lactic Acid, Venous 1.0  0.5 - 2.2 (mmol/L)  URINE MICROSCOPIC-ADD ON      Component Value Range   Squamous Epithelial / LPF FEW (*) RARE    WBC, UA 3-6  <3 (WBC/hpf)   RBC / HPF 0-2  <3 (RBC/hpf)   Bacteria, UA MANY (*) RARE    Ct Abdomen Pelvis W Contrast 07/03/2011  *RADIOLOGY REPORT*  Clinical Data: Right lower quadrant pain  CT ABDOMEN AND PELVIS WITH CONTRAST  Technique:  Multidetector CT imaging of the abdomen and pelvis was performed following the standard protocol during bolus administration of intravenous contrast.  Contrast: OMNIPAQUE IOHEXOL 300 MG/ML  SOLN  Comparison: 12/16/2007 radiograph  Findings: Limited images through the lung bases demonstrate no significant appreciable abnormality. The heart size is within normal limits. No pleural or pericardial effusion.  3 mm right middle lobe subpleural nodule.  Hypervascular area adjacent to the falciform ligament within segment two likely reflects variant perfusion.  Otherwise, unremarkable liver, biliary system, spleen, pancreas, adrenal glands.  Status post cholecystectomy.  Symmetric renal enhancement. There is a 1.3 cm cyst arising from the lower pole of the right kidney.  No hydronephrosis or hydroureter.  No urinary tract calculi.  Colonic diverticulosis without overt evidence for diverticulitis. The appendix is within normal limits.  No bowel obstruction.  No free intraperitoneal air or fluid.  No  lymphadenopathy.  Thin-walled bladder.  Absent uterus.  Adnexa within normal limits. No free fluid within the pelvis.  Numerous pelvic phlebolith.  Normal caliber vasculature.  Multilevel degenerative changes of the imaged spine. No acute or aggressive appearing osseous lesion.  IMPRESSION: Normal appendix.  No acute CT abnormality identified.  3 mm right middle lobe subpleural nodule. If the patient is at high risk for bronchogenic carcinoma, follow-up chest CT at 1 year is recommended.  If the patient is at low risk, no follow-up is needed.  This recommendation follows the consensus statement: Guidelines for Management of Small Pulmonary Nodules Detected on CT Scans:  A Statement from the Fleischner Society as published in Radiology 2005; 237:395-400.  Original Report Authenticated By: Waneta Martins, M.D.      Laray Anger, DO 07/03/11 [phone]

## 2011-07-03 NOTE — ED Notes (Signed)
Pt has completed PO contrast - resting quietly on stretcher with family at bedside; states nausea and pain "better"

## 2011-07-03 NOTE — Discharge Instructions (Signed)
RESOURCE GUIDE  Dental Problems  Patients with Medicaid: [facility]                     [facility] [address]                                           [address] Phone:  [phone]                                                  Phone:  [phone]  If unable to pay or uninsured, contact:  [facility] or [facility] to become qualified for the adult dental clinic.  Chronic Pain Problems Contact [facility]  [phone] Patients need to be referred by their primary care doctor.  Insufficient Money for Medicine Contact [employer]:  call "211" or [facility] 271-5999.  No Primary Care Doctor Call [facility]  832-8000 Other agencies that provide inexpensive medical care    [facility]  [phone]    [facility]  [phone]    [facility]  [phone]    Women's Clinic  [phone]    [facility]  [phone]    [facility]  [phone]  Psychological Services [facility]  [phone] [facility]  [phone] [facility]   [phone] (emergency services [phone])  Substance Abuse Resources [facility]  336-882-2125 [facility] 336-784-9470 [facility] 336-285-9073 [facility] 336-845-3988 Residential & Outpatient Substance Abuse Program  800-659-3381  Abuse/Neglect [place] Child Abuse Hotline (336) 641-3795 [place] Child Abuse Hotline 800-378-5315 (After Hours)  Emergency Shelter [facility] (336) 271-5985  Maternity Homes [facility] (336) 275-9566 [facility] (704) 372-4663  MRSA Hotline #:   832-7006    [place] Resources  [facility] [place]     [employer]                          [facility] [address] [place]                       [address]      [address]  [place]                                                [place]                            [place] Phone:  [phone]                                   Phone:  [phone]                 Phone:  [phone]  [facility] Phone:  [phone]    [facility] Child Abuse Hotline (813) 406-3425 6262780360 (After Hours)   Take the prescriptions as directed.  Call your regular medical doctor today to schedule a follow up appointment this week.  Return to the Emergency Department immediately sooner if worsening.

## 2011-07-04 ENCOUNTER — Other Ambulatory Visit (INDEPENDENT_AMBULATORY_CARE_PROVIDER_SITE_OTHER): Payer: BC Managed Care – PPO

## 2011-07-04 DIAGNOSIS — E119 Type 2 diabetes mellitus without complications: Secondary | ICD-10-CM

## 2011-07-04 DIAGNOSIS — E7800 Pure hypercholesterolemia, unspecified: Secondary | ICD-10-CM

## 2011-07-04 LAB — COMPREHENSIVE METABOLIC PANEL
ALT: 13 U/L (ref 0–35)
AST: 16 U/L (ref 0–37)
Albumin: 4 g/dL (ref 3.5–5.2)
BUN: 15 mg/dL (ref 6–23)
Calcium: 9.5 mg/dL (ref 8.4–10.5)
Chloride: 105 mEq/L (ref 96–112)
Potassium: 4.4 mEq/L (ref 3.5–5.1)
Sodium: 141 mEq/L (ref 135–145)
Total Protein: 6.8 g/dL (ref 6.0–8.3)

## 2011-07-04 LAB — URINE CULTURE

## 2011-07-04 LAB — LIPID PANEL: Total CHOL/HDL Ratio: 4

## 2011-07-05 LAB — LDL CHOLESTEROL, DIRECT: Direct LDL: 163.1 mg/dL

## 2011-07-05 NOTE — ED Notes (Signed)
Results received from [facility]. (+) URNC -> >/= 100,000 colonies E Coli.  RX given in ED for Keflex  -> sensitive to the same.  Chart appended per protocol.  

## 2011-07-09 ENCOUNTER — Encounter: Payer: Self-pay | Admitting: Family Medicine

## 2011-07-09 ENCOUNTER — Ambulatory Visit (INDEPENDENT_AMBULATORY_CARE_PROVIDER_SITE_OTHER): Payer: BC Managed Care – PPO | Admitting: Family Medicine

## 2011-07-09 VITALS — BP 128/72 | HR 96 | Temp 98.3°F | Wt 203.5 lb

## 2011-07-09 DIAGNOSIS — R1031 Right lower quadrant pain: Secondary | ICD-10-CM

## 2011-07-09 DIAGNOSIS — R109 Unspecified abdominal pain: Secondary | ICD-10-CM

## 2011-07-09 DIAGNOSIS — R3915 Urgency of urination: Secondary | ICD-10-CM

## 2011-07-09 LAB — POCT URINALYSIS DIPSTICK
Glucose, UA: NEGATIVE
Nitrite, UA: NEGATIVE
Protein, UA: NEGATIVE
Urobilinogen, UA: 0.2

## 2011-07-09 LAB — CBC WITH DIFFERENTIAL/PLATELET
Basophils Relative: 0.3 % (ref 0.0–3.0)
Eosinophils Absolute: 0 10*3/uL (ref 0.0–0.7)
Eosinophils Relative: 0.6 % (ref 0.0–5.0)
Lymphocytes Relative: 24.3 % (ref 12.0–46.0)
MCHC: 33.3 g/dL (ref 30.0–36.0)
Neutrophils Relative %: 69.4 % (ref 43.0–77.0)
Platelets: 271 10*3/uL (ref 150.0–400.0)
RBC: 4.23 Mil/uL (ref 3.87–5.11)
WBC: 6.3 10*3/uL (ref 4.5–10.5)

## 2011-07-09 NOTE — Progress Notes (Signed)
Addended by: Liane Comber C on: 07/09/2011 12:15 PM   Modules accepted: Orders

## 2011-07-09 NOTE — Assessment & Plan Note (Addendum)
Unsure etiology, ?acute muscle strain given so positional. No evidence of shingles.  No kidney stones on CT, no evidence of intraabd pathology on CT, no hernias on exam today. UA reassuring for Ecoli UTI being treated currently with keflex (sensitive to this abx). All blood work stable at [employer].  Check WBC to trend. rec fill vicodin, discussed adding on muscle relaxant (pt declines). Consider abd [name] specifically looking for inguinal hernias? Although given positional nature doubt hernia and did not appreciate on exam.  Watch for now. Has f/u with PCP in 2 days.

## 2011-07-09 NOTE — Progress Notes (Signed)
  Subjective:    Patient ID: [name], female    DOB: 1945/10/06, 66 y.o.   MRN: [id number]  HPI CC: f/u ER visit  Pt presents for f/u after ER visit.  Records reviewed.  Seen 4/30 with RLQ abd pain associated with n/v, as well as flank pain.  Initially concern for acute appendicitis, but WBC 9.3, lipase normal, and CT scan normal.  UA concern for infection so treated as UTI with IV rocephin and sent home with course of keflex x 10 days (QID).  UCx grew >100k Ecoli sens to keflex (pansensitive).  What brought her to ER was constant RLQ pain that traveled to lower back and flank, pain was so severe that pt was concerned about appendicitis.  CT scan - normal appendix, no kidney stones (contrasted study), diverticulosis without diverticulitis.  Sent home with vicodin but didn't fill this medicine because pain had resolved.  Also found 3 mm RML subpleural nodule, if pt high risk rpt 1 yr rec, but if low risk, no f/u needed.  S/p cholecystectomy. S/p hysterectomy.  Yesterday at 5pm pain started again.  Intermittent this time, very positional (movement produces sharp stabbing pain).  Mild nausea associated with pain.  No vomiting, diarrhea.  + constipation (from pain meds).  No bloating or indigestion or gassiness.  Last UTI was over a year ago, presented with typical sxs.  No dysuria throughout this episode.  No hematuria.  No blood in stool.  H/o DM - but well controlled. 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  Review of Systems Per HPI    Objective:   Physical Exam  Nursing note and vitals reviewed. Constitutional: She appears well-developed and well-nourished. No distress.  HENT:  Head: Normocephalic and atraumatic.  Mouth/Throat: Oropharynx is clear and moist. No oropharyngeal exudate.  Eyes: Conjunctivae and EOM are normal. Pupils are equal, round, and reactive to light. No scleral icterus.  Cardiovascular: Normal rate,  regular rhythm, normal heart sounds and intact distal pulses.   No murmur heard. Pulmonary/Chest: Effort normal and breath sounds normal. No respiratory distress. She has no wheezes. She has no rales.  Abdominal: Soft. Bowel sounds are normal. She exhibits no distension and no mass. There is no hepatosplenomegaly. There is tenderness (mild to deep palpation) in the right lower quadrant. There is no rebound, no guarding, no CVA tenderness and negative Murphy's sign. Hernia confirmed negative in the right inguinal area and confirmed negative in the left inguinal area.  Musculoskeletal: She exhibits no edema.  Skin: Skin is warm and dry. No rash noted.      Assessment & Plan:

## 2011-07-09 NOTE — Patient Instructions (Signed)
I'm not sure where this pain is coming from - we will check white count today to ensure not rising. take ibuprofen then vicodin for pain.   If worsening please let us know.  Keep appointment with Dr. Ermalene Searing on Thursday.

## 2011-07-11 ENCOUNTER — Ambulatory Visit: Payer: PRIVATE HEALTH INSURANCE | Admitting: Family Medicine

## 2011-07-16 ENCOUNTER — Encounter: Payer: Self-pay | Admitting: Family Medicine

## 2011-07-16 ENCOUNTER — Ambulatory Visit (INDEPENDENT_AMBULATORY_CARE_PROVIDER_SITE_OTHER): Payer: BC Managed Care – PPO | Admitting: Family Medicine

## 2011-07-16 DIAGNOSIS — E7800 Pure hypercholesterolemia, unspecified: Secondary | ICD-10-CM

## 2011-07-16 DIAGNOSIS — E119 Type 2 diabetes mellitus without complications: Secondary | ICD-10-CM

## 2011-07-16 DIAGNOSIS — R1031 Right lower quadrant pain: Secondary | ICD-10-CM

## 2011-07-16 DIAGNOSIS — R911 Solitary pulmonary nodule: Secondary | ICD-10-CM

## 2011-07-16 LAB — HM DIABETES FOOT EXAM

## 2011-07-16 NOTE — Assessment & Plan Note (Addendum)
Non compliant with red yeast rice and zetia. Will try harder to get into routine. Encouraged exercise, weight loss, healthy eating habits.

## 2011-07-16 NOTE — Patient Instructions (Signed)
Call if abdominal pain is worsening or fever for further evaluation like ultrasound or other. Take red yeast rice and zetia. Follow up in 6 months for CPX with fasting labs prior.

## 2011-07-16 NOTE — Progress Notes (Signed)
Subjective:    Patient ID: Eusebio Friendly, female    DOB: 04-Aug-1945, 66 y.o.   MRN: [id number]  HPI  66 year old female here for follow up DM recently seen in ED and by Dr. Reece Agar for abdominal pain.  RLQ abdominal pain - Eustaquio Boyden, MD 07/09/2011 12:06 PM Addendum  Unsure etiology, ?acute muscle strain given so positional.  No evidence of shingles. No kidney stones on CT, no evidence of intraabd pathology on CT, no hernias on exam today.  UA reassuring for Ecoli UTI being treated currently with keflex (sensitive to this abx).  Dx at ED. All blood work stable at [facility]. Check WBC to trend... (wbc was nml 5/7) rec fill vicodin, discussed adding on muscle relaxant (pt declines).  Consider abd [name] specifically looking for inguinal hernias? Although given positional nature doubt hernia and did not appreciate on exam. Watch for now.   She reports that pain is still there but better. Describes now as intermittant spasms low in groin radiating to low back.   Diabetes:   IMPROVED! Well controlled on lantus, glucophage and januvia Lab Results  Component Value Date   HGBA1C 6.8* 07/04/2011    Using medications without difficulties: Hypoglycemic episodes:none Hyperglycemic episodes:none Feet problems:none Blood Sugars averaging: checking  Two times daily FBS 120 eye exam within last year: tommorow BP at goal <130/80.  Elevated Cholesterol: Inadequate control, has not tolerated many meds in past. On zetia and red yeast rice, but not taking regularly. Using medications without problems:None Muscle aches: None Diet compliance: Good Exercise: Good, walking 2 miles 5 days a week. Other complaints:    Review of Systems  Constitutional: Negative for fever and fatigue.  Gastrointestinal: Positive for abdominal pain and constipation. Negative for nausea, diarrhea and blood in stool.       Noted constipation after being on vicodin  Genitourinary: Negative for dysuria, frequency, flank pain, vaginal  bleeding and vaginal pain.       Objective:   Physical Exam  Constitutional: Vital signs are normal. She appears well-developed and well-nourished. She is cooperative.  Non-toxic appearance. She does not appear ill. No distress.  HENT:  Head: Normocephalic.  Right Ear: Hearing, tympanic membrane, external ear and ear canal normal. Tympanic membrane is not erythematous, not retracted and not bulging.  Left Ear: Hearing, tympanic membrane, external ear and ear canal normal. Tympanic membrane is not erythematous, not retracted and not bulging.  Nose: No mucosal edema or rhinorrhea. Right sinus exhibits no maxillary sinus tenderness and no frontal sinus tenderness. Left sinus exhibits no maxillary sinus tenderness and no frontal sinus tenderness.  Mouth/Throat: Uvula is midline, oropharynx is clear and moist and mucous membranes are normal.  Eyes: Conjunctivae, EOM and lids are normal. Pupils are equal, round, and reactive to light. No foreign bodies found.  Neck: Trachea normal and normal range of motion. Neck supple. Carotid bruit is not present. No mass and no thyromegaly present.  Cardiovascular: Normal rate, regular rhythm, S1 normal, S2 normal, normal heart sounds, intact distal pulses and normal pulses.  Exam reveals no gallop and no friction rub.   No murmur heard. Pulmonary/Chest: Effort normal and breath sounds normal. Not tachypneic. No respiratory distress. She has no decreased breath sounds. She has no wheezes. She has no rhonchi. She has no rales.  Abdominal: Soft. Normal appearance and bowel sounds are normal. There is no tenderness.  Neurological: She is alert.  Skin: Skin is warm, dry and intact. No rash noted.  Psychiatric: Her  speech is normal and behavior is normal. Judgment and thought content normal. Her mood appears not anxious. Cognition and memory are normal. She does not exhibit a depressed mood.    Diabetic foot exam: Normal inspection No skin breakdown No calluses    Normal DP pulses Normal sensation to light touch and monofilament Nails normal       Assessment & Plan:

## 2011-07-16 NOTE — Assessment & Plan Note (Signed)
Work up so far negative. If not continuing to improve consider pelvic US. She has ovaries remaining.  no clear sign of hernia.

## 2011-07-16 NOTE — Assessment & Plan Note (Signed)
Excellent improvement in control.  

## 2011-07-18 ENCOUNTER — Other Ambulatory Visit: Payer: Self-pay | Admitting: *Deleted

## 2011-07-18 MED ORDER — EZETIMIBE 10 MG PO TABS: 10.0000 mg | ORAL_TABLET | Freq: Every day | ORAL | Status: DC

## 2011-10-04 ENCOUNTER — Other Ambulatory Visit: Payer: Self-pay | Admitting: *Deleted

## 2011-10-04 MED ORDER — GLUCOSE BLOOD VI STRP: ORAL_STRIP | Status: DC

## 2011-10-09 ENCOUNTER — Other Ambulatory Visit: Payer: Self-pay | Admitting: *Deleted

## 2011-10-09 MED ORDER — SITAGLIPTIN PHOSPHATE 100 MG PO TABS: 100.0000 mg | ORAL_TABLET | Freq: Every day | ORAL | Status: DC

## 2011-12-10 ENCOUNTER — Other Ambulatory Visit: Payer: Self-pay | Admitting: *Deleted

## 2011-12-10 NOTE — Telephone Encounter (Signed)
Faxed refill request from [facility] for lexapro.  Request says to take one twice a day, chart says one a day.  Please advise.

## 2011-12-11 NOTE — Telephone Encounter (Signed)
Call pt to clarify what dose she is taking and how often. Does not need to take twice a day, can take 20 mg at once .

## 2011-12-11 NOTE — Telephone Encounter (Signed)
Spoke to patient and was advised that she is out of town and will have to call back Monday to clarify the dose of Lexapro that she is taking. Patient states that she has plenty to last her until them.

## 2011-12-16 NOTE — Telephone Encounter (Signed)
Pt left v/m requesting call back at [phone]

## 2011-12-19 MED ORDER — ESCITALOPRAM OXALATE 10 MG PO TABS: 10.0000 mg | ORAL_TABLET | Freq: Every day | ORAL | Status: DC

## 2011-12-19 NOTE — Telephone Encounter (Signed)
Spoke with patient and she takes 10mg  once daily, ok to fill?

## 2011-12-20 MED ORDER — ESCITALOPRAM OXALATE 10 MG PO TABS: 10.0000 mg | ORAL_TABLET | Freq: Every day | ORAL | Status: DC

## 2012-01-09 ENCOUNTER — Telehealth: Payer: Self-pay | Admitting: Family Medicine

## 2012-01-09 DIAGNOSIS — E7800 Pure hypercholesterolemia, unspecified: Secondary | ICD-10-CM

## 2012-01-09 DIAGNOSIS — E1165 Type 2 diabetes mellitus with hyperglycemia: Secondary | ICD-10-CM

## 2012-01-09 NOTE — Telephone Encounter (Signed)
Message copied by [name] on Thu Jan 09, 2012  8:52 AM ------      Message from: Liane Comber C      Created: Thu Jan 02, 2012  2:03 PM      Regarding: labs Fri 01/10/12       Please order  future cpx labs for pt's upcomming lab appt.      Thanks      Rodney Booze

## 2012-01-10 ENCOUNTER — Other Ambulatory Visit (INDEPENDENT_AMBULATORY_CARE_PROVIDER_SITE_OTHER): Payer: BC Managed Care – PPO

## 2012-01-10 DIAGNOSIS — E7800 Pure hypercholesterolemia, unspecified: Secondary | ICD-10-CM

## 2012-01-10 DIAGNOSIS — IMO0001 Reserved for inherently not codable concepts without codable children: Secondary | ICD-10-CM

## 2012-01-10 DIAGNOSIS — E1165 Type 2 diabetes mellitus with hyperglycemia: Secondary | ICD-10-CM

## 2012-01-10 LAB — COMPREHENSIVE METABOLIC PANEL
ALT: 17 U/L (ref 0–35)
Albumin: 3.8 g/dL (ref 3.5–5.2)
Alkaline Phosphatase: 54 U/L (ref 39–117)
Glucose, Bld: 122 mg/dL — ABNORMAL HIGH (ref 70–99)
Potassium: 4.7 mEq/L (ref 3.5–5.1)
Sodium: 139 mEq/L (ref 135–145)
Total Protein: 6.8 g/dL (ref 6.0–8.3)

## 2012-01-10 LAB — LIPID PANEL
Cholesterol: 170 mg/dL (ref 0–200)
LDL Cholesterol: 113 mg/dL — ABNORMAL HIGH (ref 0–99)
Total CHOL/HDL Ratio: 4
VLDL: 15.4 mg/dL (ref 0.0–40.0)

## 2012-01-10 LAB — HEMOGLOBIN A1C: Hgb A1c MFr Bld: 7.2 % — ABNORMAL HIGH (ref 4.6–6.5)

## 2012-01-17 ENCOUNTER — Ambulatory Visit (INDEPENDENT_AMBULATORY_CARE_PROVIDER_SITE_OTHER): Payer: BC Managed Care – PPO | Admitting: Family Medicine

## 2012-01-17 ENCOUNTER — Encounter: Payer: Self-pay | Admitting: Family Medicine

## 2012-01-17 VITALS — BP 130/72 | HR 84 | Temp 98.3°F | Ht 65.5 in | Wt 203.5 lb

## 2012-01-17 DIAGNOSIS — E1165 Type 2 diabetes mellitus with hyperglycemia: Secondary | ICD-10-CM

## 2012-01-17 DIAGNOSIS — IMO0001 Reserved for inherently not codable concepts without codable children: Secondary | ICD-10-CM

## 2012-01-17 DIAGNOSIS — Z23 Encounter for immunization: Secondary | ICD-10-CM

## 2012-01-17 DIAGNOSIS — E7800 Pure hypercholesterolemia, unspecified: Secondary | ICD-10-CM

## 2012-01-17 DIAGNOSIS — IMO0002 Reserved for concepts with insufficient information to code with codable children: Secondary | ICD-10-CM

## 2012-01-17 NOTE — Progress Notes (Signed)
Subjective:    Patient ID: [name], female    DOB: January 22, 1946, 66 y.o.   MRN: [id number]  HPI 66 year old female presents for CPX.   Diabetes: Slightly worsened control from last check, almost at goal. Lab Results  Component Value Date   HGBA1C 7.2* 01/10/2012  Using medications without difficulties:  Hypoglycemic episodes:none  Hyperglycemic episodes:none  Feet problems:none  Blood Sugars averaging: checking Two times daily 117-122 eye exam within last year: 07/2011 BP at goal <130/80.   Wt Readings from Last 3 Encounters:  01/17/12 203 lb 8 oz (92.307 kg)  07/16/11 201 lb (91.173 kg)  07/09/11 203 lb 8 oz (92.307 kg)     Elevated Cholesterol: improved control from last check.On zetia and red yeast rice, mking more regulaly now.  Lab Results  Component Value Date   CHOL 170 01/10/2012   HDL 41.90 01/10/2012   LDLCALC 113* 01/10/2012   LDLDIRECT 163.1 07/04/2011   TRIG 77.0 01/10/2012   CHOLHDL 4 01/10/2012  Using medications without problems:None  Muscle aches: None  Diet compliance: Good  Exercise: Has stopped in last 1-2 month! Other complaints:       Review of Systems  Constitutional: Negative for fever, fatigue and unexpected weight change.  HENT: Negative for ear pain, congestion, sore throat, sneezing, trouble swallowing and sinus pressure.   Eyes: Negative for pain and itching.  Respiratory: Negative for cough, shortness of breath and wheezing.   Cardiovascular: Negative for chest pain, palpitations and leg swelling.  Gastrointestinal: Negative for nausea, abdominal pain, diarrhea, constipation and blood in stool.       Abdominal pain resoloved.  Genitourinary: Negative for dysuria, hematuria, vaginal discharge, difficulty urinating and menstrual problem.  Skin: Negative for rash.  Neurological: Negative for syncope, weakness, light-headedness, numbness and headaches.  Psychiatric/Behavioral: Negative for confusion and dysphoric mood. The patient is not  nervous/anxious.        Objective:   Physical Exam  Constitutional: Vital signs are normal. She appears well-developed and well-nourished. She is cooperative.  Non-toxic appearance. She does not appear ill. No distress.  HENT:  Head: Normocephalic.  Right Ear: Hearing, tympanic membrane, external ear and ear canal normal.  Left Ear: Hearing, tympanic membrane, external ear and ear canal normal.  Nose: Nose normal.  Eyes: Conjunctivae normal, EOM and lids are normal. Pupils are equal, round, and reactive to light. No foreign bodies found.  Neck: Trachea normal and normal range of motion. Neck supple. Carotid bruit is not present. No mass and no thyromegaly present.  Cardiovascular: Normal rate, regular rhythm, S1 normal, S2 normal, normal heart sounds and intact distal pulses.  Exam reveals no gallop.   No murmur heard. Pulmonary/Chest: Effort normal and breath sounds normal. No respiratory distress. She has no wheezes. She has no rhonchi. She has no rales.  Abdominal: Soft. Normal appearance and bowel sounds are normal. She exhibits no distension, no fluid wave, no abdominal bruit and no mass. There is no hepatosplenomegaly. There is no tenderness. There is no rebound, no guarding and no CVA tenderness. No hernia.  Genitourinary: Vagina normal and uterus normal. No breast swelling, tenderness, discharge or bleeding. Pelvic exam was performed with patient prone. There is no rash, tenderness or lesion on the right labia. There is no rash, tenderness or lesion on the left labia. Uterus is not enlarged and not tender. Cervix exhibits no motion tenderness, no discharge and no friability. Right adnexum displays no mass, no tenderness and no fullness. Left adnexum displays  no mass, no tenderness and no fullness.  Lymphadenopathy:    She has no cervical adenopathy.    She has no axillary adenopathy.  Neurological: She is alert. She has normal strength. No cranial nerve deficit or sensory deficit.    Skin: Skin is warm, dry and intact. No rash noted.  Psychiatric: Her speech is normal and behavior is normal. Judgment normal. Her mood appears not anxious. Cognition and memory are normal. She does not exhibit a depressed mood.     Diabetic foot exam: Normal inspection No skin breakdown No calluses  Normal DP pulses Normal sensation to light touch and monofilament Nails normal      Assessment & Plan:  The patient's preventative maintenance and recommended screening tests for an annual wellness exam were reviewed in full today. Brought up to date unless services declined.  Counselled on the importance of diet, exercise, and its role in overall health and mortality. The patient's FH and SH was reviewed, including their home life, tobacco status, and drug and alcohol status.   Nonsmoker Vaccines:Due for flu (given), PNA (given) PAP/DVE:pap not indicated, AH, ovaries remaining heavy bleeding, will do DVE yearly, mother with fallopian tube ca.  Mammo:last nml 03/2011  Colon:03/2003, nml, repeat 10 years.

## 2012-01-17 NOTE — Addendum Note (Signed)
Addended by: Consuello Masse on: 01/17/2012 02:53 PM   Modules accepted: Orders

## 2012-01-17 NOTE — Assessment & Plan Note (Signed)
Worsened control of a1c off exercise. Restart. Recheck in 6 months.

## 2012-01-17 NOTE — Assessment & Plan Note (Signed)
Improved control. Not quite at goal. On zetia and red yeast rice.

## 2012-01-17 NOTE — Patient Instructions (Addendum)
Get back on track with exercise. Continue talking zetia and red yeast rice regualrly. Schedule mammogram in 03/2011.

## 2012-02-14 ENCOUNTER — Other Ambulatory Visit: Payer: Self-pay

## 2012-02-14 MED ORDER — METFORMIN HCL ER 500 MG PO TB24: 500.0000 mg | ORAL_TABLET | Freq: Two times a day (BID) | ORAL | Status: DC

## 2012-02-14 NOTE — Telephone Encounter (Signed)
Pt request refill metformin 500 mg 24 hr tab to [facility].pt notified done. Spoke with [place] at [facility] to very rx.was what pt had been receiving and [place] said last written 10/14/11 for metformin 500 mg 24 hour tab but directions take 4 tabs in AM. Spoke with pt and she said she no longer takes 4 in AM the correct instructions are 1 tab twice a day.

## 2012-02-18 ENCOUNTER — Other Ambulatory Visit: Payer: Self-pay | Admitting: *Deleted

## 2012-02-18 MED ORDER — INSULIN PEN NEEDLE 31G X 8 MM MISC: Status: DC

## 2012-05-12 ENCOUNTER — Other Ambulatory Visit: Payer: Self-pay | Admitting: *Deleted

## 2012-05-12 MED ORDER — ESCITALOPRAM OXALATE 10 MG PO TABS: 10.0000 mg | ORAL_TABLET | Freq: Every day | ORAL | Status: DC

## 2012-06-03 ENCOUNTER — Other Ambulatory Visit: Payer: Self-pay | Admitting: Family Medicine

## 2012-06-03 DIAGNOSIS — Z1231 Encounter for screening mammogram for malignant neoplasm of breast: Secondary | ICD-10-CM

## 2012-06-08 ENCOUNTER — Ambulatory Visit (HOSPITAL_COMMUNITY)
Admission: RE | Admit: 2012-06-08 | Discharge: 2012-06-08 | Disposition: A | Discharge status: Home / Self Care | Payer: Medicare Other | Source: Ambulatory Visit | Attending: Family Medicine | Admitting: Family Medicine

## 2012-06-08 DIAGNOSIS — Z1231 Encounter for screening mammogram for malignant neoplasm of breast: Secondary | ICD-10-CM | POA: Insufficient documentation

## 2012-06-09 ENCOUNTER — Other Ambulatory Visit: Payer: Self-pay | Admitting: Family Medicine

## 2012-06-09 DIAGNOSIS — R928 Other abnormal and inconclusive findings on diagnostic imaging of breast: Secondary | ICD-10-CM

## 2012-06-18 ENCOUNTER — Ambulatory Visit
Admission: RE | Admit: 2012-06-18 | Discharge: 2012-06-18 | Disposition: A | Discharge status: Home / Self Care | Payer: Medicare Other | Source: Ambulatory Visit | Attending: Family Medicine | Admitting: Family Medicine

## 2012-06-18 DIAGNOSIS — R928 Other abnormal and inconclusive findings on diagnostic imaging of breast: Secondary | ICD-10-CM

## 2012-07-03 ENCOUNTER — Telehealth: Payer: Self-pay | Admitting: Family Medicine

## 2012-07-03 NOTE — Telephone Encounter (Signed)
All please.

## 2012-07-03 NOTE — Telephone Encounter (Signed)
Message copied by [name] on Fri Jul 03, 2012  3:58 PM ------      Message from: Baldomero Lamy      Created: Thu Jun 25, 2012 10:27 AM      Regarding: ? about labs on 5/8       Pt is scheduled for labs, he has an a1c,cmet, and lipid ordered for the same day, but appointment comment only said a1c. Do you want them all drawn or just the a1c?            Thanks      Tasha ------

## 2012-07-07 ENCOUNTER — Other Ambulatory Visit (INDEPENDENT_AMBULATORY_CARE_PROVIDER_SITE_OTHER): Payer: Medicare Other

## 2012-07-07 DIAGNOSIS — IMO0001 Reserved for inherently not codable concepts without codable children: Secondary | ICD-10-CM

## 2012-07-07 DIAGNOSIS — E1165 Type 2 diabetes mellitus with hyperglycemia: Secondary | ICD-10-CM

## 2012-07-07 DIAGNOSIS — E7800 Pure hypercholesterolemia, unspecified: Secondary | ICD-10-CM

## 2012-07-07 LAB — LIPID PANEL
Cholesterol: 192 mg/dL (ref 0–200)
LDL Cholesterol: 132 mg/dL — ABNORMAL HIGH (ref 0–99)
Total CHOL/HDL Ratio: 4
Triglycerides: 89 mg/dL (ref 0.0–149.0)
VLDL: 17.8 mg/dL (ref 0.0–40.0)

## 2012-07-07 LAB — COMPREHENSIVE METABOLIC PANEL
AST: 15 U/L (ref 0–37)
Albumin: 3.7 g/dL (ref 3.5–5.2)
Alkaline Phosphatase: 58 U/L (ref 39–117)
BUN: 17 mg/dL (ref 6–23)
Potassium: 4.3 mEq/L (ref 3.5–5.1)
Sodium: 137 mEq/L (ref 135–145)
Total Bilirubin: 0.4 mg/dL (ref 0.3–1.2)

## 2012-07-09 ENCOUNTER — Other Ambulatory Visit: Payer: BC Managed Care – PPO

## 2012-07-10 ENCOUNTER — Other Ambulatory Visit: Payer: BC Managed Care – PPO

## 2012-07-14 ENCOUNTER — Ambulatory Visit: Payer: BC Managed Care – PPO | Admitting: Family Medicine

## 2012-07-15 ENCOUNTER — Other Ambulatory Visit: Payer: Self-pay | Admitting: *Deleted

## 2012-07-15 MED ORDER — INSULIN GLARGINE 100 UNIT/ML ~~LOC~~ SOLN: 18.0000 [IU] | Freq: Every day | SUBCUTANEOUS | Status: DC

## 2012-07-17 ENCOUNTER — Ambulatory Visit: Payer: BC Managed Care – PPO | Admitting: Family Medicine

## 2012-07-20 ENCOUNTER — Ambulatory Visit (INDEPENDENT_AMBULATORY_CARE_PROVIDER_SITE_OTHER): Payer: Medicare Other | Admitting: Family Medicine

## 2012-07-20 ENCOUNTER — Encounter: Payer: Self-pay | Admitting: Family Medicine

## 2012-07-20 VITALS — Ht 65.5 in

## 2012-07-20 DIAGNOSIS — E7800 Pure hypercholesterolemia, unspecified: Secondary | ICD-10-CM

## 2012-07-21 ENCOUNTER — Encounter: Payer: Self-pay | Admitting: Family Medicine

## 2012-07-21 ENCOUNTER — Ambulatory Visit (INDEPENDENT_AMBULATORY_CARE_PROVIDER_SITE_OTHER)
Admission: RE | Admit: 2012-07-21 | Discharge: 2012-07-21 | Disposition: A | Discharge status: Home / Self Care | Payer: Medicare Other | Source: Ambulatory Visit | Attending: Family Medicine | Admitting: Family Medicine

## 2012-07-21 ENCOUNTER — Ambulatory Visit (INDEPENDENT_AMBULATORY_CARE_PROVIDER_SITE_OTHER): Payer: Medicare Other | Admitting: Family Medicine

## 2012-07-21 VITALS — BP 120/78 | HR 73 | Temp 98.6°F | Ht 65.0 in | Wt 205.0 lb

## 2012-07-21 DIAGNOSIS — E1165 Type 2 diabetes mellitus with hyperglycemia: Secondary | ICD-10-CM

## 2012-07-21 DIAGNOSIS — E7800 Pure hypercholesterolemia, unspecified: Secondary | ICD-10-CM

## 2012-07-21 DIAGNOSIS — R911 Solitary pulmonary nodule: Secondary | ICD-10-CM

## 2012-07-21 DIAGNOSIS — IMO0001 Reserved for inherently not codable concepts without codable children: Secondary | ICD-10-CM

## 2012-07-21 LAB — HM DIABETES FOOT EXAM

## 2012-07-21 MED ORDER — EZETIMIBE 10 MG PO TABS: 10.0000 mg | ORAL_TABLET | Freq: Every day | ORAL | Status: DC

## 2012-07-21 NOTE — Assessment & Plan Note (Signed)
Inadeqaute control, worsened.  Make sure on red yeast rice 2400 mg daily, restart zetia and work on lifestyle changes.

## 2012-07-21 NOTE — Progress Notes (Signed)
Though appt was 1 hour later than it was.

## 2012-07-21 NOTE — Progress Notes (Signed)
  Subjective:    Patient ID: [name], female    DOB: 1945/08/13, 67 y.o.   MRN: [id number]  HPI Diabetes: Continued worsened control from last check, on Lantus 18-20 Units daily depending on FBS, januvia and metformin Lab Results  Component Value Date   HGBA1C 7.9* 07/07/2012   Using medications without difficulties:  Hypoglycemic episodes:none  Hyperglycemic episodes:none  Feet problems:none  Blood Sugars averaging: checking one time daily, FBS 115-152,  eye exam within last year: 07/2012  Wt Readings from Last 3 Encounters:  07/21/12 205 lb (92.987 kg)  01/17/12 203 lb 8 oz (92.307 kg)  07/16/11 201 lb (91.173 kg)     BP at goal <130/80.   Elevated Cholesterol: improved control from last check.On red yeast rice twice daily., taking more regulaly now.  Stopped zetia when started red yeast rice.  Lab Results  Component Value Date   CHOL 192 07/07/2012   HDL 42.70 07/07/2012   LDLCALC 132* 07/07/2012   LDLDIRECT 163.1 07/04/2011   TRIG 89.0 07/07/2012   CHOLHDL 4 07/07/2012  Muscle aches: None  Diet compliance: Good  Exercise: NONE Other complaints:       Review of Systems  Constitutional: Negative for fever and fatigue.  HENT: Negative for ear pain.   Eyes: Negative for pain.  Respiratory: Negative for chest tightness and shortness of breath.   Cardiovascular: Negative for chest pain, palpitations and leg swelling.  Gastrointestinal: Negative for abdominal pain.  Genitourinary: Negative for dysuria.       Objective:   Physical Exam  Constitutional: Vital signs are normal. She appears well-developed and well-nourished. She is cooperative.  Non-toxic appearance. She does not appear ill. No distress.  HENT:  Head: Normocephalic.  Right Ear: Hearing, tympanic membrane, external ear and ear canal normal. Tympanic membrane is not erythematous, not retracted and not bulging.  Left Ear: Hearing, tympanic membrane, external ear and ear canal normal. Tympanic membrane is not  erythematous, not retracted and not bulging.  Nose: No mucosal edema or rhinorrhea. Right sinus exhibits no maxillary sinus tenderness and no frontal sinus tenderness. Left sinus exhibits no maxillary sinus tenderness and no frontal sinus tenderness.  Mouth/Throat: Uvula is midline, oropharynx is clear and moist and mucous membranes are normal.  Eyes: Conjunctivae, EOM and lids are normal. Pupils are equal, round, and reactive to light. No foreign bodies found.  Neck: Trachea normal and normal range of motion. Neck supple. Carotid bruit is not present. No mass and no thyromegaly present.  Cardiovascular: Normal rate, regular rhythm, S1 normal, S2 normal, normal heart sounds, intact distal pulses and normal pulses.  Exam reveals no gallop and no friction rub.   No murmur heard. Pulmonary/Chest: Effort normal and breath sounds normal. Not tachypneic. No respiratory distress. She has no decreased breath sounds. She has no wheezes. She has no rhonchi. She has no rales.  Abdominal: Soft. Normal appearance and bowel sounds are normal. There is no tenderness.  Neurological: She is alert.  Skin: Skin is warm, dry and intact. No rash noted.  Psychiatric: Her speech is normal and behavior is normal. Judgment and thought content normal. Her mood appears not anxious. Cognition and memory are normal. She does not exhibit a depressed mood.    Diabetic foot exam: Normal inspection No skin breakdown No calluses  Normal DP pulses Normal sensation to light touch and monofilament Nails normal       Assessment & Plan:

## 2012-07-21 NOTE — Progress Notes (Signed)
Diabetes: Continued worsened control from last check, on LAntus , januvia and metformin Lab Results  Component Value Date   HGBA1C 7.9* 07/07/2012   Using medications without difficulties:  Hypoglycemic episodes:none  Hyperglycemic episodes:none  Feet problems:none  Blood Sugars averaging: checking Two times daily 117-122  eye exam within last year: 07/2011   BP at goal <130/80.   Elevated Cholesterol: improved control from last check.On zetia and red yeast rice, taking more regulaly now.  Lab Results  Component Value Date   CHOL 192 07/07/2012   HDL 42.70 07/07/2012   LDLCALC 132* 07/07/2012   LDLDIRECT 163.1 07/04/2011   TRIG 89.0 07/07/2012   CHOLHDL 4 07/07/2012    Muscle aches: None  Diet compliance: Good  Exercise: Has stopped in last 1-2 month!  Other complaints:

## 2012-07-21 NOTE — Patient Instructions (Addendum)
Work on diet changes... When you can try to exercise. Increase Lantus to 20-22 Units. Make sure red yeast rice 1200 mg twice daily. Add back zetia.  Stop at front desk to set up CT scan of chest.

## 2012-07-21 NOTE — Assessment & Plan Note (Signed)
Will re-eval with Chest CT.

## 2012-07-21 NOTE — Assessment & Plan Note (Signed)
Worsened control. Get back on track with diet and exercise. Increase lantus to 22 Untis daily. (Pt hesitant about increasing Lantus)

## 2012-09-14 ENCOUNTER — Other Ambulatory Visit: Payer: Self-pay | Admitting: *Deleted

## 2012-09-14 MED ORDER — METFORMIN HCL ER 500 MG PO TB24: 500.0000 mg | ORAL_TABLET | Freq: Two times a day (BID) | ORAL | Status: DC

## 2012-09-14 MED ORDER — INSULIN GLARGINE 100 UNIT/ML ~~LOC~~ SOLN: 18.0000 [IU] | Freq: Every day | SUBCUTANEOUS | Status: DC

## 2012-09-14 NOTE — Telephone Encounter (Signed)
Received faxed refill request from pharmacy. Refills sent to pharmacy electronically. 

## 2012-10-15 ENCOUNTER — Other Ambulatory Visit: Payer: Self-pay | Admitting: Family Medicine

## 2012-10-16 ENCOUNTER — Other Ambulatory Visit: Payer: Medicare Other | Admitting: Family Medicine

## 2012-10-16 ENCOUNTER — Other Ambulatory Visit (INDEPENDENT_AMBULATORY_CARE_PROVIDER_SITE_OTHER): Payer: Medicare Other

## 2012-10-16 DIAGNOSIS — E1165 Type 2 diabetes mellitus with hyperglycemia: Secondary | ICD-10-CM

## 2012-10-16 DIAGNOSIS — E7800 Pure hypercholesterolemia, unspecified: Secondary | ICD-10-CM

## 2012-10-16 DIAGNOSIS — IMO0001 Reserved for inherently not codable concepts without codable children: Secondary | ICD-10-CM

## 2012-10-16 LAB — COMPREHENSIVE METABOLIC PANEL
ALT: 21 U/L (ref 0–35)
BUN: 15 mg/dL (ref 6–23)
CO2: 29 mEq/L (ref 19–32)
Calcium: 9.1 mg/dL (ref 8.4–10.5)
Chloride: 106 mEq/L (ref 96–112)
Creatinine, Ser: 0.9 mg/dL (ref 0.4–1.2)
GFR: 71.21 mL/min (ref >60.00)
Glucose, Bld: 141 mg/dL — ABNORMAL HIGH (ref 70–99)
Total Bilirubin: 0.3 mg/dL (ref 0.3–1.2)

## 2012-10-16 LAB — LIPID PANEL
Cholesterol: 186 mg/dL (ref 0–200)
HDL: 45.4 mg/dL (ref >39.00)
Total CHOL/HDL Ratio: 4
Triglycerides: 64 mg/dL (ref 0.0–149.0)

## 2012-10-16 LAB — HEMOGLOBIN A1C: Hgb A1c MFr Bld: 8 % — ABNORMAL HIGH (ref 4.6–6.5)

## 2012-10-23 ENCOUNTER — Ambulatory Visit (INDEPENDENT_AMBULATORY_CARE_PROVIDER_SITE_OTHER): Payer: Medicare Other | Admitting: Family Medicine

## 2012-10-23 ENCOUNTER — Encounter: Payer: Self-pay | Admitting: Family Medicine

## 2012-10-23 VITALS — BP 118/70 | HR 90 | Temp 98.2°F | Ht 65.0 in | Wt 206.0 lb

## 2012-10-23 DIAGNOSIS — E1165 Type 2 diabetes mellitus with hyperglycemia: Secondary | ICD-10-CM

## 2012-10-23 DIAGNOSIS — IMO0001 Reserved for inherently not codable concepts without codable children: Secondary | ICD-10-CM

## 2012-10-23 DIAGNOSIS — E7800 Pure hypercholesterolemia, unspecified: Secondary | ICD-10-CM

## 2012-10-23 MED ORDER — ATORVASTATIN CALCIUM 10 MG PO TABS: ORAL_TABLET | ORAL | Status: DC

## 2012-10-23 NOTE — Assessment & Plan Note (Signed)
Minimal improvement with zetia.  Stop zetia. She is willing to try once weekly low dose statin again. Start lipitor 10 mg weekly, increase as able.

## 2012-10-23 NOTE — Progress Notes (Signed)
67 year old female presents for follow up.  Diabetes: Worsened control despite increase in Lantus 22 Units as well as [place] and metformin Lab Results  Component Value Date   HGBA1C 8.0* 10/16/2012  Using medications without difficulties: None Hypoglycemic episodes: None Hyperglycemic episodes:None Feet problems:None Blood Sugars averaging: 117-130 eye exam within last year: Less snacking but larger meals  Elevated Cholesterol: Not at goal < 100. restarted zetia and red yeast rice.... slight improvement  Lab Results  Component Value Date   CHOL 186 10/16/2012   HDL 45.40 10/16/2012   LDLCALC 128* 10/16/2012   LDLDIRECT 163.1 07/04/2011   TRIG 64.0 10/16/2012   CHOLHDL 4 10/16/2012  Using medications without problems:None Muscle aches:  Diet compliance:improved in last 2 weeks. Exercise:None  Other complaints: Review of Systems  Constitutional: Negative for fever and fatigue.  HENT: Negative for ear pain.  Eyes: Negative for pain.  Respiratory: Negative for chest tightness and shortness of breath.  Cardiovascular: Negative for chest pain, palpitations and leg swelling.  Gastrointestinal: Negative for abdominal pain.  Genitourinary: Negative for dysuria.  Objective:   Physical Exam  Constitutional: Vital signs are normal. She appears well-developed and well-nourished. She is cooperative. Non-toxic appearance. She does not appear ill. No distress.  HENT:  Head: Normocephalic.  Right Ear: Hearing, tympanic membrane, external ear and ear canal normal. Tympanic membrane is not erythematous, not retracted and not bulging.  Left Ear: Hearing, tympanic membrane, external ear and ear canal normal. Tympanic membrane is not erythematous, not retracted and not bulging.  Nose: No mucosal edema or rhinorrhea. Right sinus exhibits no maxillary sinus tenderness and no frontal sinus tenderness. Left sinus exhibits no maxillary sinus tenderness and no frontal sinus tenderness.  Mouth/Throat: Uvula  is midline, oropharynx is clear and moist and mucous membranes are normal.  Eyes: Conjunctivae, EOM and lids are normal. Pupils are equal, round, and reactive to light. No foreign bodies found.  Neck: Trachea normal and normal range of motion. Neck supple. Carotid bruit is not present. No mass and no thyromegaly present.  Cardiovascular: Normal rate, regular rhythm, S1 normal, S2 normal, normal heart sounds, intact distal pulses and normal pulses. Exam reveals no gallop and no friction rub.  No murmur heard.  Pulmonary/Chest: Effort normal and breath sounds normal. Not tachypneic. No respiratory distress. She has no decreased breath sounds. She has no wheezes. She has no rhonchi. She has no rales.  Abdominal: Soft. Normal appearance and bowel sounds are normal. There is no tenderness.  Neurological: She is alert.  Skin: Skin is warm, dry and intact. No rash noted.  Psychiatric: Her speech is normal and behavior is normal. Judgment and thought content normal. Her mood appears not anxious. Cognition and memory are normal. She does not exhibit a depressed mood.  Diabetic foot exam:  Normal inspection  No skin breakdown  No calluses  Normal DP pulses  Normal sensation to light touch and monofilament  Nails normal

## 2012-10-23 NOTE — Assessment & Plan Note (Signed)
Poor control. She is hesitant about increasing lantus further due to fear of low CBGs at night. Will instead increase metfromin 1 more tablet at bedtime. MAy need increase to 2 max if not effective enough. Encouraged exercise, weight loss, healthy eating habits.

## 2012-10-23 NOTE — Patient Instructions (Signed)
Increase metformin to 2 tabs in morning and 1 tabs in evening. Keep up lifestyle changes longterm.  Get back on track with exercise. Stop zetia. Restart statin medication but only once a week.. Increase as able.

## 2012-11-23 ENCOUNTER — Other Ambulatory Visit: Payer: Self-pay | Admitting: Family Medicine

## 2012-11-23 DIAGNOSIS — R922 Inconclusive mammogram: Secondary | ICD-10-CM

## 2012-12-03 ENCOUNTER — Other Ambulatory Visit: Payer: Self-pay | Admitting: Family Medicine

## 2012-12-09 ENCOUNTER — Ambulatory Visit
Admission: RE | Admit: 2012-12-09 | Discharge: 2012-12-09 | Disposition: A | Discharge status: Home / Self Care | Payer: BC Managed Care – PPO | Source: Ambulatory Visit | Attending: Family Medicine | Admitting: Family Medicine

## 2012-12-09 DIAGNOSIS — R922 Inconclusive mammogram: Secondary | ICD-10-CM

## 2012-12-09 DIAGNOSIS — R9230 Dense breasts, unspecified: Secondary | ICD-10-CM

## 2013-01-05 ENCOUNTER — Encounter: Payer: Self-pay | Admitting: Internal Medicine

## 2013-01-05 ENCOUNTER — Ambulatory Visit (INDEPENDENT_AMBULATORY_CARE_PROVIDER_SITE_OTHER): Payer: Medicare Other | Admitting: Internal Medicine

## 2013-01-05 VITALS — BP 138/76 | HR 74 | Temp 98.5°F | Wt 212.2 lb

## 2013-01-05 DIAGNOSIS — H6092 Unspecified otitis externa, left ear: Secondary | ICD-10-CM

## 2013-01-05 DIAGNOSIS — H9202 Otalgia, left ear: Secondary | ICD-10-CM

## 2013-01-05 DIAGNOSIS — H9209 Otalgia, unspecified ear: Secondary | ICD-10-CM

## 2013-01-05 DIAGNOSIS — H60399 Other infective otitis externa, unspecified ear: Secondary | ICD-10-CM

## 2013-01-05 MED ORDER — CIPROFLOXACIN-DEXAMETHASONE 0.3-0.1 % OT SUSP: 4.0000 [drp] | Freq: Two times a day (BID) | OTIC | Status: DC

## 2013-01-05 MED ORDER — ANTIPYRINE-BENZOCAINE 5.4-1.4 % OT SOLN: 2.0000 [drp] | OTIC | Status: DC | PRN

## 2013-01-05 NOTE — Progress Notes (Signed)
Subjective:    Patient ID: [name], female    DOB: Apr 23, 1945, 67 y.o.   MRN: [id number]  HPI  Pt presents to the clinic today with c/o left ear pain. This started about a week ago. She has been out of town this past week and just got home last night. She describes the pain as soreness but not overly painful. She denies associated symptoms. She denies fever. She has taken tylenol OTC which has helped some. She has been evaluated by Dr. Thelma King in [place].  She also request a flu shot today.  Review of Systems      Past Medical History  Diagnosis Date  . Disorders of bursae and tendons in shoulder region, unspecified   . Headache(784.0)   . Other urinary incontinence   . Diverticulosis of colon (without mention of hemorrhage)   . Internal hemorrhoids without mention of complication   . Obesity, unspecified   . Depressive disorder, not elsewhere classified   . Diaphragmatic hernia without mention of obstruction or gangrene   . Pure hypercholesterolemia   . Diabetes mellitus without mention of complication     Current Outpatient Prescriptions  Medication Sig Dispense Refill  . antipyrine-benzocaine (AURALGAN) otic solution Place 2 drops into the left ear every 3 (three) hours as needed for ear pain.      [name] aspirin 81 MG tablet Take 81 mg by mouth daily.        [name] atorvastatin (LIPITOR) 10 MG tablet 1 tablet weekly, increase as able  30 tablet  3  . calcium-vitamin D (OSCAL WITH D) 500-200 MG-UNIT per tablet Take 1 tablet by mouth daily.        [name] escitalopram (LEXAPRO) 10 MG tablet Take 1 tablet (10 mg total) by mouth daily.  30 tablet  3  . insulin glargine (LANTUS) 100 UNIT/ML injection Inject 0.18 mLs (18 Units total) into the skin daily.  15 mL  1  . Insulin Pen Needle (B-D ULTRAFINE III SHORT PEN) 31G X 8 MM MISC Use as directed Dx:250.00  100 each  6  . JANUVIA 100 MG tablet TAKE ONE (1) TABLET BY MOUTH EVERY DAY  30 tablet  5  . metFORMIN (GLUCOPHAGE-XR) 500 MG 24 hr tablet  Take 500 mg by mouth. 2 tab in AM and 1 tab at PM      . Multiple Vitamin (MULTIVITAMIN) tablet Take 1 tablet by mouth daily.        . ONE TOUCH ULTRA TEST test strip USE TO CHECK BLOOD SUGAR 3 TIMES DAILY AS DIRECTED  100 each  11  . ranitidine (ZANTAC) 150 MG tablet Take 150 mg by mouth daily.        . Red Yeast Rice 600 MG CAPS Take 2,400 mg by mouth daily.         No current facility-administered medications for this visit.    Allergies  Allergen Reactions  . Cortisone   . Pravastatin Sodium     REACTION: leg pain    Family History  Problem Relation Age of Onset  . Cancer Mother     fullopian tube  . Dementia Mother   . Pulmonary embolism Mother   . Cancer Father     lung  . Hypertension Sister   . Diabetes Brother   . Stroke Paternal Grandmother   . Stroke Paternal Grandfather     History   Social History  . Marital Status: Married    Spouse Name: N/A  Number of Children: 2  . 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 Smoking status: Never Smoker   . Smokeless tobacco: Not on file  . Alcohol Use: No  . Drug Use: No  . Sexual Activity: Not on file   Other Topics Concern  . Not on file   Social History Narrative   Exercise walks 3 times daily         Diet fruit, occ fast food, salads and lean meat     Constitutional: Denies fever, malaise, fatigue, headache or abrupt weight changes.  HEENT: Denies eye pain, eye redness, ringing in the ears, wax buildup, runny nose, nasal congestion, bloody nose, or sore throat. Respiratory: Denies difficulty breathing, shortness of breath, cough or sputum production.     No other specific complaints in a complete review of systems (except as listed in HPI above).  Objective:   Physical Exam   BP 138/76  Pulse 74  Temp(Src) 98.5 F (36.9 C) (Oral)  Wt 212 lb 4 oz (96.276 kg)  SpO2 96% Wt Readings from Last 3 Encounters:  01/05/13 212 lb 4 oz  (96.276 kg)  10/23/12 206 lb (93.441 kg)  07/21/12 205 lb (92.987 kg)    General: Appears her stated age, well developed, well nourished in NAD. Skin: Warm, dry and intact. No rashes, lesions or ulcerations noted. HEENT: Head: normal shape and size; Eyes: sclera white, no icterus, conjunctiva pink, PERRLA and EOMs intact;  Right Ear: Tm's gray and intact, normal light reflex;Left Ear: external ear canal inflamed and swollen. Nose: mucosa pink and moist, septum midline; Throat/Mouth: Teeth present, mucosa pink and moist, no exudate, lesions or ulcerations noted.  Cardiovascular: Normal rate and rhythm. S1,S2 noted.  No murmur, rubs or gallops noted. No JVD or BLE edema. No carotid bruits noted. Pulmonary/Chest: Normal effort and positive vesicular breath sounds. No respiratory distress. No wheezes, rales or ronchi noted.    BMET    Component Value Date/Time   NA 138 10/16/2012 0823   K 4.5 10/16/2012 0823   CL 106 10/16/2012 0823   CO2 29 10/16/2012 0823   GLUCOSE 141* 10/16/2012 0823   BUN 15 10/16/2012 0823   CREATININE 0.9 10/16/2012 0823   CREATININE 0.8 07/10/2010   CALCIUM 9.1 10/16/2012 0823   GFRNONAA 65* 07/02/2011 2305   GFRAA 76* 07/02/2011 2305    Lipid Panel     Component Value Date/Time   CHOL 186 10/16/2012 0823   TRIG 64.0 10/16/2012 0823   HDL 45.40 10/16/2012 0823   CHOLHDL 4 10/16/2012 0823   VLDL 12.8 10/16/2012 0823   LDLCALC 128* 10/16/2012 0823    CBC    Component Value Date/Time   WBC 6.3 07/09/2011 1216   RBC 4.23 07/09/2011 1216   HGB 12.3 07/09/2011 1216   HCT 36.8 07/09/2011 1216   PLT 271.0 07/09/2011 1216   MCV 87.1 07/09/2011 1216   MCH 29.1 07/02/2011 2305   MCHC 33.3 07/09/2011 1216   RDW 14.3 07/09/2011 1216   LYMPHSABS 1.5 07/09/2011 1216   MONOABS 0.3 07/09/2011 1216   EOSABS 0.0 07/09/2011 1216   BASOSABS 0.0 07/09/2011 1216    Hgb A1C Lab Results  Component Value Date   HGBA1C 8.0* 10/16/2012        Assessment & Plan:   [name] Externa, left ear:  eRx for  Ciprodex Refilled Auralgan otic drops for pain relief Flu shot today  per pt request  RTC as needed or if not feeling any better in 3-5 days

## 2013-01-05 NOTE — Patient Instructions (Signed)

## 2013-01-06 DIAGNOSIS — Z23 Encounter for immunization: Secondary | ICD-10-CM

## 2013-01-06 NOTE — Addendum Note (Signed)
Addended by: Criselda Peaches B on: 01/06/2013 09:56 AM   Modules accepted: Orders

## 2013-01-18 ENCOUNTER — Other Ambulatory Visit (INDEPENDENT_AMBULATORY_CARE_PROVIDER_SITE_OTHER): Payer: Medicare Other

## 2013-01-18 DIAGNOSIS — E1165 Type 2 diabetes mellitus with hyperglycemia: Secondary | ICD-10-CM

## 2013-01-18 DIAGNOSIS — IMO0001 Reserved for inherently not codable concepts without codable children: Secondary | ICD-10-CM

## 2013-01-18 DIAGNOSIS — E7800 Pure hypercholesterolemia, unspecified: Secondary | ICD-10-CM

## 2013-01-18 LAB — COMPREHENSIVE METABOLIC PANEL
Albumin: 3.8 g/dL (ref 3.5–5.2)
Alkaline Phosphatase: 63 U/L (ref 39–117)
BUN: 18 mg/dL (ref 6–23)
CO2: 31 mEq/L (ref 19–32)
Calcium: 9.4 mg/dL (ref 8.4–10.5)
Chloride: 103 mEq/L (ref 96–112)
Glucose, Bld: 178 mg/dL — ABNORMAL HIGH (ref 70–99)
Potassium: 5.1 mEq/L (ref 3.5–5.1)
Sodium: 139 mEq/L (ref 135–145)
Total Protein: 6.9 g/dL (ref 6.0–8.3)

## 2013-01-18 LAB — LIPID PANEL
Cholesterol: 168 mg/dL (ref 0–200)
LDL Cholesterol: 113 mg/dL — ABNORMAL HIGH (ref 0–99)
Total CHOL/HDL Ratio: 4
Triglycerides: 67 mg/dL (ref 0.0–149.0)

## 2013-01-22 ENCOUNTER — Ambulatory Visit: Payer: Medicare Other | Admitting: Family Medicine

## 2013-01-29 ENCOUNTER — Other Ambulatory Visit: Payer: Self-pay | Admitting: Family Medicine

## 2013-02-02 ENCOUNTER — Encounter: Payer: Self-pay | Admitting: Family Medicine

## 2013-02-02 ENCOUNTER — Ambulatory Visit (INDEPENDENT_AMBULATORY_CARE_PROVIDER_SITE_OTHER): Payer: Medicare Other | Admitting: Family Medicine

## 2013-02-02 VITALS — BP 124/70 | HR 75 | Temp 98.1°F | Ht 65.0 in | Wt 209.5 lb

## 2013-02-02 DIAGNOSIS — IMO0001 Reserved for inherently not codable concepts without codable children: Secondary | ICD-10-CM

## 2013-02-02 DIAGNOSIS — E1165 Type 2 diabetes mellitus with hyperglycemia: Secondary | ICD-10-CM

## 2013-02-02 DIAGNOSIS — E7800 Pure hypercholesterolemia, unspecified: Secondary | ICD-10-CM

## 2013-02-02 NOTE — Assessment & Plan Note (Signed)
Increase lipitor to 3 times weekly for better control. Encouraged exercise, weight loss, healthy eating habits.

## 2013-02-02 NOTE — Patient Instructions (Addendum)
Increase lipitor to 3 times weekly if able. If any muscle pain.. Can add co enzyme Q 10. Get back track with healthy eating and regular exercise. Check blood sugar at least daily, occ 2 hours after meals. Increase metformin to 2 in the morning and 1at night. CIf blood sugars are still elevated  > 120 in morning,  > 200 after meals... Can increase to  4 tabs total daily.

## 2013-02-02 NOTE — Assessment & Plan Note (Signed)
Worsened control. Pt hand outs given. Discussed diet change and lifestyle changes in detail.  Increase metformin to 2 tabs in AM and 1 at PM. Recheck in 3 months.

## 2013-02-02 NOTE — Progress Notes (Signed)
Pre-visit discussion using our clinic review tool. No additional management support is needed unless otherwise documented below in the visit note.  

## 2013-02-02 NOTE — Progress Notes (Signed)
67 year old female presents for  3 month follow up.   BP Readings from Last 3 Encounters:  02/02/13 124/70  01/05/13 138/76  10/23/12 118/70     Diabetes: Worsened control on Lantus 22 Units as well as [place] and metformin ( she never increased metformin)  She has been under a lot of stress lately and she is a stress eater.  She has not been eating well. Lab Results  Component Value Date   HGBA1C 8.3* 01/18/2013  Using medications without difficulties: None  Hypoglycemic episodes: None  Hyperglycemic episodes:None  Feet problems:None  Blood Sugars averaging: 117-156, has not been checking more than 2 times a week. eye exam within last year:  07/2012   Elevated Cholesterol: Not at goal < 100. Stopped zetia as not effective.. On lipitor 10 mg  twice weekly ( in past SE)... slight continued improvement  Lab Results  Component Value Date   CHOL 168 01/18/2013   HDL 41.80 01/18/2013   LDLCALC 113* 01/18/2013   LDLDIRECT 163.1 07/04/2011   TRIG 67.0 01/18/2013   CHOLHDL 4 01/18/2013  Using medications without problems:None  Muscle aches:  Diet compliance:not great. Exercise:None  Other complaints:   Review of Systems  Constitutional: Negative for fever and fatigue.  HENT: Negative for ear pain.  Eyes: Negative for pain.  Respiratory: Negative for chest tightness and shortness of breath.  Cardiovascular: Negative for chest pain, palpitations and leg swelling.  Gastrointestinal: Negative for abdominal pain.  Genitourinary: Negative for dysuria.  Objective:   Physical Exam  Constitutional: Vital signs are normal. She appears well-developed and well-nourished. She is cooperative. Non-toxic appearance. She does not appear ill. No distress.  HENT:  Head: Normocephalic.  Right Ear: Hearing, tympanic membrane, external ear and ear canal normal. Tympanic membrane is not erythematous, not retracted and not bulging.  Left Ear: Hearing, tympanic membrane, external ear and ear canal  normal. Tympanic membrane is not erythematous, not retracted and not bulging.  Nose: No mucosal edema or rhinorrhea. Right sinus exhibits no maxillary sinus tenderness and no frontal sinus tenderness. Left sinus exhibits no maxillary sinus tenderness and no frontal sinus tenderness.  Mouth/Throat: Uvula is midline, oropharynx is clear and moist and mucous membranes are normal.  Eyes: Conjunctivae, EOM and lids are normal. Pupils are equal, round, and reactive to light. No foreign bodies found.  Neck: Trachea normal and normal range of motion. Neck supple. Carotid bruit is not present. No mass and no thyromegaly present.  Cardiovascular: Normal rate, regular rhythm, S1 normal, S2 normal, normal heart sounds, intact distal pulses and normal pulses. Exam reveals no gallop and no friction rub.  No murmur heard.  Pulmonary/Chest: Effort normal and breath sounds normal. Not tachypneic. No respiratory distress. She has no decreased breath sounds. She has no wheezes. She has no rhonchi. She has no rales.  Abdominal: Soft. Normal appearance and bowel sounds are normal. There is no tenderness.  Neurological: She is alert.  Skin: Skin is warm, dry and intact. No rash noted.  Psychiatric: Her speech is normal and behavior is normal. Judgment and thought content normal. Her mood appears not anxious. Cognition and memory are normal. She does not exhibit a depressed mood.   Diabetic foot exam:  Normal inspection  No skin breakdown  No calluses  Normal DP pulses  Normal sensation to light touch and monofilament  Nails normal

## 2013-02-10 ENCOUNTER — Other Ambulatory Visit: Payer: Self-pay | Admitting: Family Medicine

## 2013-03-10 ENCOUNTER — Telehealth: Payer: Self-pay

## 2013-03-10 NOTE — Telephone Encounter (Signed)
pt has changed insurance co and request written rx for multiple refills; pt will bring list by office today and request cb when rxs are ready for pick up. Pt understands Dr Diona Browner will be in office on 03/12/13.

## 2013-03-15 NOTE — Telephone Encounter (Signed)
Spoke with pt and she has decided to use [facility] as preferred pharmacy. Pt will contact [facility].

## 2013-03-28 ENCOUNTER — Other Ambulatory Visit: Payer: Self-pay | Admitting: Family Medicine

## 2013-05-04 ENCOUNTER — Other Ambulatory Visit (INDEPENDENT_AMBULATORY_CARE_PROVIDER_SITE_OTHER): Payer: Medicare HMO

## 2013-05-04 DIAGNOSIS — IMO0002 Reserved for concepts with insufficient information to code with codable children: Secondary | ICD-10-CM

## 2013-05-04 DIAGNOSIS — E7800 Pure hypercholesterolemia, unspecified: Secondary | ICD-10-CM

## 2013-05-04 DIAGNOSIS — IMO0001 Reserved for inherently not codable concepts without codable children: Secondary | ICD-10-CM

## 2013-05-04 DIAGNOSIS — E1165 Type 2 diabetes mellitus with hyperglycemia: Secondary | ICD-10-CM

## 2013-05-04 LAB — CBC WITH DIFFERENTIAL/PLATELET
BASOS ABS: 0 10*3/uL (ref 0.0–0.1)
Basophils Relative: 0.2 % (ref 0.0–3.0)
EOS ABS: 0.1 10*3/uL (ref 0.0–0.7)
Eosinophils Relative: 2.4 % (ref 0.0–5.0)
HCT: 35 % — ABNORMAL LOW (ref 36.0–46.0)
Hemoglobin: 11.5 g/dL — ABNORMAL LOW (ref 12.0–15.0)
LYMPHS PCT: 34.2 % (ref 12.0–46.0)
Lymphs Abs: 1.7 10*3/uL (ref 0.7–4.0)
MCHC: 32.9 g/dL (ref 30.0–36.0)
MCV: 86.7 fl (ref 78.0–100.0)
Monocytes Absolute: 0.4 10*3/uL (ref 0.1–1.0)
Monocytes Relative: 8 % (ref 3.0–12.0)
Neutro Abs: 2.8 10*3/uL (ref 1.4–7.7)
Neutrophils Relative %: 55.2 % (ref 43.0–77.0)
PLATELETS: 310 10*3/uL (ref 150.0–400.0)
RBC: 4.04 Mil/uL (ref 3.87–5.11)
RDW: 14.5 % (ref 11.5–14.6)
WBC: 5.1 10*3/uL (ref 4.5–10.5)

## 2013-05-04 LAB — LIPID PANEL
CHOL/HDL RATIO: 5
Cholesterol: 192 mg/dL (ref 0–200)
HDL: 42.4 mg/dL (ref >39.00)
LDL Cholesterol: 137 mg/dL — ABNORMAL HIGH (ref 0–99)
Triglycerides: 63 mg/dL (ref 0.0–149.0)
VLDL: 12.6 mg/dL (ref 0.0–40.0)

## 2013-05-04 LAB — COMPREHENSIVE METABOLIC PANEL
ALBUMIN: 3.8 g/dL (ref 3.5–5.2)
ALK PHOS: 63 U/L (ref 39–117)
ALT: 18 U/L (ref 0–35)
AST: 18 U/L (ref 0–37)
BUN: 17 mg/dL (ref 6–23)
CO2: 30 mEq/L (ref 19–32)
Calcium: 9.1 mg/dL (ref 8.4–10.5)
Chloride: 102 mEq/L (ref 96–112)
Creatinine, Ser: 0.9 mg/dL (ref 0.4–1.2)
GFR: 65.71 mL/min (ref >60.00)
GLUCOSE: 129 mg/dL — AB (ref 70–99)
POTASSIUM: 4.4 meq/L (ref 3.5–5.1)
SODIUM: 136 meq/L (ref 135–145)
TOTAL PROTEIN: 6.9 g/dL (ref 6.0–8.3)
Total Bilirubin: 0.3 mg/dL (ref 0.3–1.2)

## 2013-05-04 LAB — HEMOGLOBIN A1C: HEMOGLOBIN A1C: 7.9 % — AB (ref 4.6–6.5)

## 2013-05-11 ENCOUNTER — Encounter: Payer: Self-pay | Admitting: Family Medicine

## 2013-05-11 ENCOUNTER — Ambulatory Visit (INDEPENDENT_AMBULATORY_CARE_PROVIDER_SITE_OTHER): Payer: Medicare HMO | Admitting: Family Medicine

## 2013-05-11 VITALS — BP 116/60 | HR 85 | Temp 98.1°F | Ht 65.5 in | Wt 208.0 lb

## 2013-05-11 DIAGNOSIS — IMO0002 Reserved for concepts with insufficient information to code with codable children: Secondary | ICD-10-CM

## 2013-05-11 DIAGNOSIS — IMO0001 Reserved for inherently not codable concepts without codable children: Secondary | ICD-10-CM

## 2013-05-11 DIAGNOSIS — E1165 Type 2 diabetes mellitus with hyperglycemia: Secondary | ICD-10-CM

## 2013-05-11 DIAGNOSIS — Z23 Encounter for immunization: Secondary | ICD-10-CM

## 2013-05-11 DIAGNOSIS — Z0000 Encounter for general adult medical examination without abnormal findings: Secondary | ICD-10-CM

## 2013-05-11 DIAGNOSIS — E7800 Pure hypercholesterolemia, unspecified: Secondary | ICD-10-CM

## 2013-05-11 MED ORDER — INSULIN GLULISINE 100 UNIT/ML SOLOSTAR PEN: 2.0000 [IU] | PEN_INJECTOR | Freq: Every day | SUBCUTANEOUS | Status: DC

## 2013-05-11 MED ORDER — ATORVASTATIN CALCIUM 10 MG PO TABS: 10.0000 mg | ORAL_TABLET | Freq: Every day | ORAL | Status: DC

## 2013-05-11 NOTE — Addendum Note (Signed)
Addended by: Modena Nunnery on: 05/11/2013 12:44 PM   Modules accepted: Orders

## 2013-05-11 NOTE — Patient Instructions (Addendum)
Gradully increase atorvastatin as able. Can stop red yeast rice as increasing. Increase lantus to 22 Units in morning. Start apidra fast acting insulin 2 units with largest meal of the day. Bring in measurements in 2 weeks for DM follow up.   Also follow up in 3 months DM with fasting labs prior. Check with insurance about shingles vaccine. Set up bone density and mammogram, have breast center call for an order.

## 2013-05-11 NOTE — Progress Notes (Signed)
I have personally reviewed the Medicare Annual Wellness questionnaire and have noted 1. The patient's medical and social history 2. Their use of alcohol, tobacco or illicit drugs 3. Their current medications and supplements 4. The patient's functional ability including ADL's, fall risks, home safety risks and hearing or visual             impairment. 5. Diet and physical activities 6. Evidence for depression or mood disorders The patients weight, height, BMI and visual acuity have been recorded in the chart I have made referrals, counseling and provided education to the patient based review of the above and I have provided the pt with a written personalized care plan for preventive services.   Diabetes: Worsened control on Lantus 22 Units as well as [place] and metformin ( she never increased metformin)   She has been under a lot of stress lately and she is a stress eater. She has not been eating well. Lab Results  Component Value Date   HGBA1C 7.9* 05/04/2013  Using medications without difficulties: None  Hypoglycemic episodes: None  Hyperglycemic episodes:None  Feet problems:None  Blood Sugars averaging: 92-150, not checkling after meals.  eye exam within last year: 07/2012   Elevated Cholesterol: worsened. Not at goal < 100. Stopped zetia as not effective.. On lipitor 10 mg twice weekly ( in past SE)... slight continued improvement   Has not had any in last week or 2.  She is willing to increase. Lab Results  Component Value Date   CHOL 192 05/04/2013   HDL 42.40 05/04/2013   LDLCALC 137* 05/04/2013   LDLDIRECT 163.1 07/04/2011   TRIG 63.0 05/04/2013   CHOLHDL 5 05/04/2013  Using medications without problems:None  Muscle aches:  Diet compliance:not great.  Exercise: 5 days a week.  Wt Readings from Last 3 Encounters:  05/11/13 208 lb (94.348 kg)  02/02/13 209 lb 8 oz (95.029 kg)  01/05/13 212 lb 4 oz (96.276 kg)     Review of Systems  Constitutional: Negative for fever and  fatigue.  HENT: Negative for ear pain.  Eyes: Negative for pain.  Respiratory: Negative for chest tightness and shortness of breath.  Cardiovascular: Negative for chest pain, palpitations and leg swelling.  Gastrointestinal: Negative for abdominal pain.  Genitourinary: Negative for dysuria.  Objective:   Physical Exam  Constitutional: Vital signs are normal. She appears well-developed and well-nourished. She is cooperative. Non-toxic appearance. She does not appear ill. No distress.  HENT:  Head: Normocephalic.  Right Ear: Hearing, tympanic membrane, external ear and ear canal normal. Tympanic membrane is not erythematous, not retracted and not bulging.  Left Ear: Hearing, tympanic membrane, external ear and ear canal normal. Tympanic membrane is not erythematous, not retracted and not bulging.  Nose: No mucosal edema or rhinorrhea. Right sinus exhibits no maxillary sinus tenderness and no frontal sinus tenderness. Left sinus exhibits no maxillary sinus tenderness and no frontal sinus tenderness.  Mouth/Throat: Uvula is midline, oropharynx is clear and moist and mucous membranes are normal.  Eyes: Conjunctivae, EOM and lids are normal. Pupils are equal, round, and reactive to light. No foreign bodies found.  Neck: Trachea normal and normal range of motion. Neck supple. Carotid bruit is not present. No mass and no thyromegaly present.  Cardiovascular: Normal rate, regular rhythm, S1 normal, S2 normal, normal heart sounds, intact distal pulses and normal pulses. Exam reveals no gallop and no friction rub.  No murmur heard.  Pulmonary/Chest: Effort normal and breath sounds normal. Not tachypneic. No respiratory  distress. She has no decreased breath sounds. She has no wheezes. She has no rhonchi. She has no rales.  Abdominal: Soft. Normal appearance and bowel sounds are normal. There is no tenderness.  Neurological: She is alert.  Skin: Skin is warm, dry and intact. No rash noted.  Psychiatric:  Her speech is normal and behavior is normal. Judgment and thought content normal. Her mood appears not anxious. Cognition and memory are normal. She does not exhibit a depressed mood.   Diabetic foot exam:  Normal inspection  No skin breakdown  No calluses  Normal DP pulses  Normal sensation to light touch and monofilament  Nails normal   Assessment and PLAN:  The patient's preventative maintenance and recommended screening tests for an annual wellness exam were reviewed in full today. Brought up to date unless services declined.  Counselled on the importance of diet, exercise, and its role in overall health and mortality. The patient's FH and SH was reviewed, including their home life, tobacco status, and drug and alcohol status.   Vaccines: Uptodate with PNA, Tdap, will consider shingles and get prevnar today.  Mammo: 06/2012, will schedule  Colon: 2010, likely repeat dure in 2020.  DEXA: due  Nonsmoker PAP/DVE: has ovaries, had partial hysterectomy.

## 2013-05-11 NOTE — Progress Notes (Signed)
Pre visit review using our clinic review tool, if applicable. No additional management support is needed unless otherwise documented below in the visit note. 

## 2013-05-13 ENCOUNTER — Telehealth: Payer: Self-pay

## 2013-05-13 MED ORDER — INSULIN GLULISINE 100 UNIT/ML SOLOSTAR PEN: 2.0000 [IU] | PEN_INJECTOR | Freq: Every day | SUBCUTANEOUS | Status: DC

## 2013-05-13 NOTE — Addendum Note (Signed)
Addended by: Carter Kitten on: 05/13/2013 05:03 PM   Modules accepted: Orders

## 2013-05-13 NOTE — Telephone Encounter (Signed)
Relevant patient education mailed to patient.  

## 2013-05-19 ENCOUNTER — Other Ambulatory Visit: Payer: Self-pay | Admitting: Family Medicine

## 2013-05-20 ENCOUNTER — Other Ambulatory Visit: Payer: Self-pay

## 2013-05-20 DIAGNOSIS — Z9889 Other specified postprocedural states: Secondary | ICD-10-CM

## 2013-05-20 DIAGNOSIS — Z1231 Encounter for screening mammogram for malignant neoplasm of breast: Secondary | ICD-10-CM

## 2013-05-25 ENCOUNTER — Encounter: Payer: Self-pay | Admitting: Family Medicine

## 2013-05-25 ENCOUNTER — Ambulatory Visit (INDEPENDENT_AMBULATORY_CARE_PROVIDER_SITE_OTHER): Payer: Medicare HMO | Admitting: Family Medicine

## 2013-05-25 VITALS — BP 108/66 | HR 76 | Temp 98.1°F | Ht 65.5 in | Wt 207.5 lb

## 2013-05-25 DIAGNOSIS — J3489 Other specified disorders of nose and nasal sinuses: Secondary | ICD-10-CM

## 2013-05-25 DIAGNOSIS — IMO0001 Reserved for inherently not codable concepts without codable children: Secondary | ICD-10-CM

## 2013-05-25 DIAGNOSIS — R0981 Nasal congestion: Secondary | ICD-10-CM

## 2013-05-25 DIAGNOSIS — E1165 Type 2 diabetes mellitus with hyperglycemia: Secondary | ICD-10-CM

## 2013-05-25 DIAGNOSIS — IMO0002 Reserved for concepts with insufficient information to code with codable children: Secondary | ICD-10-CM

## 2013-05-25 MED ORDER — ATORVASTATIN CALCIUM 10 MG PO TABS: 10.0000 mg | ORAL_TABLET | Freq: Every day | ORAL | Status: DC

## 2013-05-25 NOTE — Progress Notes (Signed)
   Subjective:    Patient ID: [name], female    DOB: Sep 20, 1945, 68 y.o.   MRN: [id number]  Diabetes Pertinent negatives for diabetes include no fatigue.   68 year old female presents for 2 week follow up.  Diabetes:   At last OV: Increased lantus to 22 Units in morning.  and metformin. Start apidra fast acting insulin 2 units with largest meal of the day. She was unable to fill aphidra due to cost. She has instead been more aggressive of diet changes. Hypoglycemic episodes: None Hyperglycemic episodes:  Blood Sugars averaging: 81-130.[name] Average 106 2 hours after meals 105-189. Walking 5 times a week.     Review of Systems  Constitutional: Negative for fever and fatigue.  HENT: Positive for postnasal drip and sinus pressure. Negative for ear pain.        Nasal congestion  Eyes: Negative for pain.  Respiratory: Negative for cough and shortness of breath.        Objective:   Physical Exam  Constitutional: Vital signs are normal. She appears well-developed and well-nourished. She is cooperative.  Non-toxic appearance. She does not appear ill. No distress.  HENT:  Head: Normocephalic.  Right Ear: Hearing, tympanic membrane, external ear and ear canal normal. Tympanic membrane is not erythematous, not retracted and not bulging.  Left Ear: Hearing, tympanic membrane, external ear and ear canal normal. Tympanic membrane is not erythematous, not retracted and not bulging.  Nose: Mucosal edema and rhinorrhea present. Right sinus exhibits no maxillary sinus tenderness and no frontal sinus tenderness. Left sinus exhibits no maxillary sinus tenderness and no frontal sinus tenderness.  Mouth/Throat: Uvula is midline, oropharynx is clear and moist and mucous membranes are normal.  Eyes: Conjunctivae, EOM and lids are normal. Pupils are equal, round, and reactive to light. Lids are everted and swept, no foreign bodies found.  Neck: Trachea normal and normal range of motion. Neck  supple. Carotid bruit is not present. No mass and no thyromegaly present.  Cardiovascular: Normal rate, regular rhythm, S1 normal, S2 normal, normal heart sounds, intact distal pulses and normal pulses.  Exam reveals no gallop and no friction rub.   No murmur heard. Pulmonary/Chest: Effort normal and breath sounds normal. Not tachypneic. No respiratory distress. She has no decreased breath sounds. She has no wheezes. She has no rhonchi. She has no rales.  Abdominal: Soft. Normal appearance and bowel sounds are normal. There is no tenderness.  Neurological: She is alert.  Skin: Skin is warm, dry and intact. No rash noted.  Psychiatric: Her speech is normal and behavior is normal. Judgment and thought content normal. Her mood appears not anxious. Cognition and memory are normal. She does not exhibit a depressed mood.          Assessment & Plan:

## 2013-05-25 NOTE — Assessment & Plan Note (Signed)
Could not afford aphidra.  Improvement with 22 units lantus and diet change. Encouraged exercise, weight loss, healthy eating habits.

## 2013-05-25 NOTE — Progress Notes (Signed)
Pre visit review using our clinic review tool, if applicable. No additional management support is needed unless otherwise documented below in the visit note. 

## 2013-05-25 NOTE — Assessment & Plan Note (Signed)
Allergies vs virus.  Symptomatic care.

## 2013-05-25 NOTE — Patient Instructions (Signed)
Continue current meds. Can use mucinex and nasal saline spray.

## 2013-06-01 ENCOUNTER — Other Ambulatory Visit: Payer: Self-pay | Admitting: *Deleted

## 2013-06-01 MED ORDER — INSULIN GLARGINE 100 UNIT/ML SOLOSTAR PEN: PEN_INJECTOR | SUBCUTANEOUS | Status: DC

## 2013-06-03 ENCOUNTER — Telehealth: Payer: Self-pay

## 2013-06-03 NOTE — Telephone Encounter (Signed)
Relevant patient education mailed to patient.  

## 2013-06-09 ENCOUNTER — Ambulatory Visit
Admission: RE | Admit: 2013-06-09 | Discharge: 2013-06-09 | Disposition: A | Discharge status: Home / Self Care | Payer: BC Managed Care – PPO | Source: Ambulatory Visit

## 2013-06-09 DIAGNOSIS — Z9889 Other specified postprocedural states: Secondary | ICD-10-CM

## 2013-06-09 DIAGNOSIS — Z1231 Encounter for screening mammogram for malignant neoplasm of breast: Secondary | ICD-10-CM

## 2013-06-18 ENCOUNTER — Other Ambulatory Visit: Payer: Self-pay | Admitting: Family Medicine

## 2013-07-09 ENCOUNTER — Encounter: Payer: Self-pay | Admitting: Internal Medicine

## 2013-07-30 ENCOUNTER — Other Ambulatory Visit: Payer: Self-pay | Admitting: Family Medicine

## 2013-07-31 NOTE — Telephone Encounter (Signed)
Last office visit 05/25/2013.  Januvia is not on current medication list.  Ok to refill?

## 2013-08-03 LAB — HM DIABETES EYE EXAM

## 2013-08-05 ENCOUNTER — Telehealth: Payer: Self-pay | Admitting: Family Medicine

## 2013-08-05 DIAGNOSIS — E1165 Type 2 diabetes mellitus with hyperglycemia: Secondary | ICD-10-CM

## 2013-08-05 DIAGNOSIS — IMO0002 Reserved for concepts with insufficient information to code with codable children: Secondary | ICD-10-CM

## 2013-08-05 NOTE — Telephone Encounter (Signed)
Message copied by Jinny Sanders on Thu Aug 05, 2013  4:48 PM ------      Message from: [name] Sia      Created: Thu Aug 05, 2013 10:27 AM      Regarding: Lab orders for Tuesday, 6.9.15       Labs for 3 month f/u ------

## 2013-08-10 ENCOUNTER — Other Ambulatory Visit (INDEPENDENT_AMBULATORY_CARE_PROVIDER_SITE_OTHER): Payer: Medicare HMO

## 2013-08-10 ENCOUNTER — Encounter (INDEPENDENT_AMBULATORY_CARE_PROVIDER_SITE_OTHER): Payer: Self-pay

## 2013-08-10 DIAGNOSIS — E1165 Type 2 diabetes mellitus with hyperglycemia: Secondary | ICD-10-CM

## 2013-08-10 DIAGNOSIS — IMO0001 Reserved for inherently not codable concepts without codable children: Secondary | ICD-10-CM

## 2013-08-10 DIAGNOSIS — IMO0002 Reserved for concepts with insufficient information to code with codable children: Secondary | ICD-10-CM

## 2013-08-10 DIAGNOSIS — E7800 Pure hypercholesterolemia, unspecified: Secondary | ICD-10-CM

## 2013-08-10 LAB — COMPREHENSIVE METABOLIC PANEL
ALBUMIN: 3.9 g/dL (ref 3.5–5.2)
ALT: 25 U/L (ref 0–35)
AST: 23 U/L (ref 0–37)
Alkaline Phosphatase: 58 U/L (ref 39–117)
BILIRUBIN TOTAL: 0.5 mg/dL (ref 0.2–1.2)
BUN: 14 mg/dL (ref 6–23)
CO2: 30 mEq/L (ref 19–32)
Calcium: 9 mg/dL (ref 8.4–10.5)
Chloride: 104 mEq/L (ref 96–112)
Creatinine, Ser: 0.9 mg/dL (ref 0.4–1.2)
GFR: 69.15 mL/min (ref >60.00)
GLUCOSE: 149 mg/dL — AB (ref 70–99)
Potassium: 4.4 mEq/L (ref 3.5–5.1)
Sodium: 139 mEq/L (ref 135–145)
Total Protein: 6.8 g/dL (ref 6.0–8.3)

## 2013-08-10 LAB — LIPID PANEL
Cholesterol: 147 mg/dL (ref 0–200)
HDL: 44.5 mg/dL (ref >39.00)
LDL Cholesterol: 95 mg/dL (ref 0–99)
NONHDL: 102.5
Total CHOL/HDL Ratio: 3
Triglycerides: 39 mg/dL (ref 0.0–149.0)
VLDL: 7.8 mg/dL (ref 0.0–40.0)

## 2013-08-10 LAB — HEMOGLOBIN A1C: HEMOGLOBIN A1C: 8.1 % — AB (ref 4.6–6.5)

## 2013-08-17 ENCOUNTER — Encounter: Payer: Self-pay | Admitting: Family Medicine

## 2013-08-17 ENCOUNTER — Ambulatory Visit (INDEPENDENT_AMBULATORY_CARE_PROVIDER_SITE_OTHER): Payer: Medicare HMO | Admitting: Family Medicine

## 2013-08-17 VITALS — BP 114/68 | HR 79 | Temp 98.2°F | Wt 203.5 lb

## 2013-08-17 DIAGNOSIS — IMO0002 Reserved for concepts with insufficient information to code with codable children: Secondary | ICD-10-CM

## 2013-08-17 DIAGNOSIS — IMO0001 Reserved for inherently not codable concepts without codable children: Secondary | ICD-10-CM

## 2013-08-17 DIAGNOSIS — E1165 Type 2 diabetes mellitus with hyperglycemia: Secondary | ICD-10-CM

## 2013-08-17 DIAGNOSIS — E7800 Pure hypercholesterolemia, unspecified: Secondary | ICD-10-CM

## 2013-08-17 LAB — HM DIABETES FOOT EXAM

## 2013-08-17 NOTE — Assessment & Plan Note (Signed)
Excellent control now on lipitor daily. No SE!

## 2013-08-17 NOTE — Patient Instructions (Addendum)
Continue lipitor at 10 mg daily. Can start CO enzyme Q10 ( COQ10) for statin SE if needed. Stop at front desk to set up endocrinologist. Keep up working on healthy eating, exercise and weight loss. Schedule CPX in 05/2014.

## 2013-08-17 NOTE — Assessment & Plan Note (Signed)
Poor control. She is on multiple meds and is working hard on lifestyle changes.  We will refer to endo for further options.

## 2013-08-17 NOTE — Progress Notes (Signed)
Pre visit review using our clinic review tool, if applicable. No additional management support is needed unless otherwise documented below in the visit note. 

## 2013-08-17 NOTE — Progress Notes (Signed)
68 year old female presents for 3 month follow up.  Diabetes: Worsened control Lantus 22 Units as well as [place] and metformin. Could not afford aphidra. Lab Results  Component Value Date   HGBA1C 8.1* 08/10/2013  Using medications without difficulties: None  Hypoglycemic episodes: None  Hyperglycemic episodes:None  Feet problems:None  Blood Sugars averaging: 95-160, 180 after meals. eye exam within last year: 07/2012   Elevated Cholesterol: Excellent control now at goal < 100.  On lipitor 10 mg daily. NO SE at this tim Lab Results  Component Value Date   CHOL 147 08/10/2013   HDL 44.50 08/10/2013   LDLCALC 95 08/10/2013   LDLDIRECT 163.1 07/04/2011   TRIG 39.0 08/10/2013   CHOLHDL 3 08/10/2013  Using medications without problems:None  Muscle aches:  Diet compliance:not great.  Exercise: 5 days a week.  Wt Readings from Last 3 Encounters:  08/17/13 203 lb 8 oz (92.307 kg)  05/25/13 207 lb 8 oz (94.121 kg)  05/11/13 208 lb (94.348 kg)    Review of Systems  Constitutional: Negative for fever and fatigue.  HENT: Negative for ear pain.  Eyes: Negative for pain.  Respiratory: Negative for chest tightness and shortness of breath.  Cardiovascular: Negative for chest pain, palpitations and leg swelling.  Gastrointestinal: Negative for abdominal pain.  Genitourinary: Negative for dysuria.  Objective:   Physical Exam  Constitutional: Vital signs are normal. She appears well-developed and well-nourished. She is cooperative. Non-toxic appearance. She does not appear ill. No distress.  HENT:  Head: Normocephalic.  Mouth/Throat: Uvula is midline, oropharynx is clear and moist and mucous membranes are normal.  Eyes: Conjunctivae, EOM and lids are normal. Pupils are equal, round, and reactive to light. No foreign bodies found.  Neck: Trachea normal and normal range of motion. Neck supple. Carotid bruit is not present. No mass and no thyromegaly present.  Cardiovascular: Normal rate, regular  rhythm, S1 normal, S2 normal, normal heart sounds, intact distal pulses and normal pulses. Exam reveals no gallop and no friction rub.  No murmur heard.  Pulmonary/Chest: Effort normal and breath sounds normal. Not tachypneic. No respiratory distress. She has no decreased breath sounds. She has no wheezes. She has no rhonchi. She has no rales.  Abdominal: Soft. Normal appearance and bowel sounds are normal. There is no tenderness.  Neurological: She is alert.  Skin: Skin is warm, dry and intact. No rash noted.  Diabetic foot exam:  Normal inspection  No skin breakdown  No calluses  Normal DP pulses  Normal sensation to light touch and monofilament  Nails normal

## 2013-08-30 ENCOUNTER — Encounter: Payer: Self-pay | Admitting: Internal Medicine

## 2013-08-30 ENCOUNTER — Ambulatory Visit (INDEPENDENT_AMBULATORY_CARE_PROVIDER_SITE_OTHER): Payer: Medicare HMO | Admitting: Internal Medicine

## 2013-08-30 VITALS — BP 108/68 | HR 79 | Temp 98.5°F | Resp 12 | Ht 65.5 in | Wt 206.6 lb

## 2013-08-30 DIAGNOSIS — E1165 Type 2 diabetes mellitus with hyperglycemia: Secondary | ICD-10-CM

## 2013-08-30 DIAGNOSIS — IMO0001 Reserved for inherently not codable concepts without codable children: Secondary | ICD-10-CM

## 2013-08-30 DIAGNOSIS — IMO0002 Reserved for concepts with insufficient information to code with codable children: Secondary | ICD-10-CM

## 2013-08-30 MED ORDER — METFORMIN HCL ER 500 MG PO TB24: 1000.0000 mg | ORAL_TABLET | Freq: Two times a day (BID) | ORAL | Status: DC

## 2013-08-30 NOTE — Patient Instructions (Addendum)
Please continue Metformin XR but increase to 1000 mg 2x a day. Decrease Lantus to 18 units and move this at bedtime. Continue Januvia 100 mg but move it to am.  Please return in 2 months with your sugar log (after Sept 9th).

## 2013-08-30 NOTE — Progress Notes (Signed)
Patient ID: [name], female   DOB: 1945-12-06, 68 y.o.   MRN: [id number]  HPI: [name] is a 68 y.o.-year-old female, referred by her PCP, Dr. Diona Browner, for management of DM2, non-insulin-dependent, uncontrolled, without complications.  Patient has been diagnosed with diabetes in 1995; she started on insulin last year. Last hemoglobin A1c was: Lab Results  Component Value Date   HGBA1C 8.1* 08/10/2013   HGBA1C 7.9* 05/04/2013   HGBA1C 8.3* 01/18/2013   Pt is on a regimen of: - Metformin XR 500 mg po bid - Lantus 22 units in am - Januvia 100 mg daily  Pt checks her sugars 2 a day and they are: - am: 87-149 - 2h after b'fast: 112-139 - before lunch: 77-150 - 2h after lunch: 65-188 - before dinner: 95-143 - 2h after dinner: 118 - bedtime: 95-161 (203) - nighttime: n/c No lows. Lowest sugar was 6 mo ago: 45; 1 mo ago: 55 - after working outside, recently 1x65; she has hypoglycemia awareness at 76.  Highest sugar was 258.  Pt's meals are: - Breakfast: cereals + 2% milk; bread + cheese; bread + PB - Lunch: sandwich; soup - Dinner: chicken; steak; seafood - Snacks: 2: PB crackers  She walks 5x a week.  - no CKD, last BUN/creatinine:  Lab Results  Component Value Date   BUN 14 08/10/2013   CREATININE 0.9 08/10/2013   - last set of lipids: Lab Results  Component Value Date   CHOL 147 08/10/2013   HDL 44.50 08/10/2013   LDLCALC 95 08/10/2013   LDLDIRECT 163.1 07/04/2011   TRIG 39.0 08/10/2013   CHOLHDL 3 08/10/2013  On Red yeast rice and Lipitor.  - last eye exam was in 08/2013. No DR.  - no numbness and tingling in her feet.  Pt has FH of DM in brother and sister, mother.  ROS: Constitutional: no weight gain/loss, increased appetite, + fatigue, no subjective hyperthermia/hypothermia, + excessive urination and nocturia Eyes: + blurry vision, no xerophthalmia ENT: no sore throat, no nodules palpated in throat, no dysphagia/odynophagia, no hoarseness, + decreased  hearing Cardiovascular: no CP/SOB/palpitations/leg swelling Respiratory: no cough/SOB Gastrointestinal: no N/V/D/C Musculoskeletal: no muscle/joint aches Skin: no rashes Neurological: no tremors/numbness/tingling/dizziness Psychiatric: + depression/no anxiety + low libido  Past Medical History  Diagnosis Date  . Disorders of bursae and tendons in shoulder region, unspecified   . Headache(784.0)   . Other urinary incontinence   . Diverticulosis of colon (without mention of hemorrhage)   . Internal hemorrhoids without mention of complication   . Obesity, unspecified   . Depressive disorder, not elsewhere classified   . Diaphragmatic hernia without mention of obstruction or gangrene   . Pure hypercholesterolemia   . Diabetes mellitus without mention of complication    Past Surgical History  Procedure Laterality Date  . Tonsillectomy    . Cholecystectomy    . Breast surgery      Reduction  . Abdominal hysterectomy  1972    Have both ovaries   History   Social History  . 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 Smoking status: Never Smoker   . Smokeless tobacco: Never Used  . Alcohol Use: No  . Drug Use: No   Social History Narrative   Exercise walks 3 times daily         Diet  fruit, occ fast food, salads and lean meat   Current Outpatient Prescriptions on File Prior to Visit  Medication Sig Dispense Refill  . aspirin 81 MG tablet Take 81 mg by mouth daily.        [name] atorvastatin (LIPITOR) 10 MG tablet Take 1 tablet (10 mg total) by mouth at bedtime.  30 tablet  11  . calcium-vitamin D (OSCAL WITH D) 500-200 MG-UNIT per tablet Take 1 tablet by mouth daily.        [name] escitalopram (LEXAPRO) 10 MG tablet TAKE ONE TABLET BY MOUTH ONCE DAILY  30 tablet  5  . Insulin Glargine (LANTUS SOLOSTAR) 100 UNIT/ML Solostar Pen INJECT 22 UNITS INTO THE SKIN DAILY AS  DIRECTED  15 mL  5  . Insulin Pen Needle (B-D ULTRAFINE III SHORT PEN) 31G X 8 MM MISC Use as directed Dx:250.00  100 each  6  . JANUVIA 100 MG tablet TAKE ONE TABLET BY MOUTH ONCE DAILY  30 tablet  0  . metFORMIN (GLUCOPHAGE-XR) 500 MG 24 hr tablet TAKE 1 TABLET BY MOUTH TWICE A DAY  60 tablet  5  . Multiple Vitamin (MULTIVITAMIN) tablet Take 1 tablet by mouth daily.        . ONE TOUCH ULTRA TEST test strip USE TO CHECK BLOOD SUGAR 3 TIMES DAILY AS DIRECTED  100 each  11  . ranitidine (ZANTAC) 150 MG tablet Take 150 mg by mouth daily.        . Red Yeast Rice 600 MG CAPS Take 2,400 mg by mouth daily.         No current facility-administered medications on file prior to visit.   Allergies  Allergen Reactions  . Cortisone   . Pravastatin Sodium     REACTION: leg pain   Family History  Problem Relation Age of Onset  . Cancer Mother     fullopian tube  . Dementia Mother   . Pulmonary embolism Mother   . Cancer Father     lung  . Hypertension Sister   . Diabetes Brother   . Stroke Paternal Grandmother   . Stroke Paternal Grandfather    PE: BP 108/68  Pulse 79  Temp(Src) 98.5 F (36.9 C) (Oral)  Resp 12  Ht 5' 5.5" (1.664 m)  Wt 206 lb 9.6 oz (93.713 kg)  BMI 33.84 kg/m2  SpO2 97% Wt Readings from Last 3 Encounters:  08/30/13 206 lb 9.6 oz (93.713 kg)  08/17/13 203 lb 8 oz (92.307 kg)  05/25/13 207 lb 8 oz (94.121 kg)   Constitutional: overweight, in NAD Eyes: PERRLA, EOMI, no exophthalmos ENT: moist mucous membranes, no thyromegaly, no cervical lymphadenopathy Cardiovascular: RRR, No MRG Respiratory: CTA B Gastrointestinal: abdomen soft, NT, ND, BS+ Musculoskeletal: no deformities, strength intact in all 4 Skin: moist, warm, no rashes Neurological: no tremor with outstretched hands, DTR normal in all 4  ASSESSMENT: 1. DM2, insulin-dependent, uncontrolled, without complications  PLAN:  1. Patient with long-standing,suboptimally controlled diabetes, on oral  antidiabetic regimen, which became insufficient - We discussed about options for treatment, and I suggested to:  Patient Instructions  Please continue Metformin XR but increase to 1000 mg 2x a day. Decrease Lantus to 18 units and move this at bedtime. Continue Januvia 100 mg but move it to am. Please return in 2 months with your sugar log (after Sept 9th).  - discussed at length about improving her diet (given specific examples) - Strongly advised her to start checking sugars at different times  of the day - check 2 times a day, rotating checks - given sugar log and advised how to fill it and to bring it at next appt  - given foot care handout and explained the principles  - given instructions for hypoglycemia management "15-15 rule"  - advised for yearly eye exams, she is up to date - Return to clinic in 2 mo with sugar log

## 2013-08-31 ENCOUNTER — Ambulatory Visit: Payer: Medicare HMO | Admitting: Internal Medicine

## 2013-08-31 DIAGNOSIS — Z0289 Encounter for other administrative examinations: Secondary | ICD-10-CM

## 2013-09-01 ENCOUNTER — Ambulatory Visit (INDEPENDENT_AMBULATORY_CARE_PROVIDER_SITE_OTHER): Payer: Medicare HMO | Admitting: Family Medicine

## 2013-09-01 ENCOUNTER — Encounter: Payer: Self-pay | Admitting: Family Medicine

## 2013-09-01 VITALS — BP 126/80 | HR 73 | Temp 98.3°F | Ht 65.5 in | Wt 204.8 lb

## 2013-09-01 DIAGNOSIS — R3915 Urgency of urination: Secondary | ICD-10-CM

## 2013-09-01 DIAGNOSIS — N3000 Acute cystitis without hematuria: Secondary | ICD-10-CM

## 2013-09-01 LAB — POCT URINALYSIS DIPSTICK
Bilirubin, UA: NEGATIVE
Blood, UA: NEGATIVE
Glucose, UA: NEGATIVE
Ketones, UA: NEGATIVE
LEUKOCYTES UA: NEGATIVE
Nitrite, UA: NEGATIVE
PH UA: 5
PROTEIN UA: NEGATIVE
Spec Grav, UA: 1.015
UROBILINOGEN UA: 0.2

## 2013-09-01 MED ORDER — AMOXICILLIN-POT CLAVULANATE 875-125 MG PO TABS: 1.0000 | ORAL_TABLET | Freq: Two times a day (BID) | ORAL | Status: DC

## 2013-09-01 NOTE — Progress Notes (Signed)
Pre visit review using our clinic review tool, if applicable. No additional management support is needed unless otherwise documented below in the visit note. 

## 2013-09-01 NOTE — Progress Notes (Signed)
[place] [address] Phone: [phone] Fax: [phone]  Patient ID: [name] MRN: [id number], DOB: 09-20-1945, 68 y.o. Date of Encounter: 09/01/2013  Primary Physician:  Eliezer Lofts, MD   Chief Complaint: Urinary Urgency and Back Pain   Subjective:   History of Present Illness:  This 68 y.o. female patient presents with burning, urgency. No vaginal discharge or external irritation.  No STD exposure. No abd pain, no flank pain.  AZO - taking.  [facility] [place] [place].   Recent UTI, given ? Abx, then changed to augmentin x 10 days [facility]  Patient Active Problem List   Diagnosis Date Noted  . Incidental lung nodule 07/16/2011  . Diabetes type 2, uncontrolled 07/17/2010  . Low iron 07/17/2010  . 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 SLEEP APNEA 10/25/2008  . HYPERCHOLESTEROLEMIA 06/11/2006  . DEPRESSION 06/11/2006  . HEMORRHOIDS, INTERNAL 06/11/2006  . DIVERTICULOSIS, COLON 06/11/2006  . STRESS INCONTINENCE 06/11/2006    Past Medical History  Diagnosis Date  . Disorders of bursae and tendons in shoulder region, unspecified   . Headache(784.0)   . Other urinary incontinence   . Diverticulosis of colon (without mention of hemorrhage)   . Internal hemorrhoids without mention of complication   . Obesity, unspecified   . Depressive disorder, not elsewhere classified   . Diaphragmatic hernia without mention of obstruction or gangrene   . Pure hypercholesterolemia   . Diabetes mellitus without mention of complication     Past Surgical History  Procedure Laterality Date  . Tonsillectomy    . Cholecystectomy    . Breast surgery      Reduction  . Abdominal hysterectomy  1972    Have both ovaries    History   Social History  . Marital Status: Married    Spouse Name: N/A    Number of Children: 2  . 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 Smoking status:  Never Smoker   . Smokeless tobacco: Never Used  . Alcohol Use: No  . Drug Use: No  . Sexual Activity: Not on file   Other Topics Concern  . Not on file   Social History Narrative   Exercise walks 3 times daily         Diet fruit, occ fast food, salads and lean meat    Family History  Problem Relation Age of Onset  . Cancer Mother     fullopian tube  . Dementia Mother   . Pulmonary embolism Mother   . Cancer Father     lung  . Hypertension Sister   . Diabetes Brother   . Stroke Paternal Grandmother   . Stroke Paternal Grandfather     Allergies  Allergen Reactions  . Cortisone   . Pravastatin Sodium     REACTION: leg pain    Medication list reviewed and updated in full in [place].  ROS: GEN:  no fevers, chills. GI: No n/v/d, eating normally Otherwise, ROS is as per the HPI.  Objective:   PHYSICAL EXAM  Filed Vitals:   09/01/13 1010  BP: 126/80  Pulse: 73  Temp: 98.3 F (36.8 C)  TempSrc: Oral  Height: 5' 5.5" (1.664 m)  Weight: 204 lb 12 oz (92.874 kg)    GEN: WDWN, A&Ox4,NAD. Non-toxic HEENT: Atraumatc, normocephalic. CV: RRR, No M/G/R PULM: CTA B, No wheezes, crackles, or rhonchi  ABD: S, NT, ND, +BS, no rebound. No CVAT. No suprapubic tenderness. EXT: No c/c/e  Objective Data: Results for orders placed in visit on 09/01/13  POCT URINALYSIS DIPSTICK      Result Value Ref Range   Color, UA yellow     Clarity, UA clear     Glucose, UA negative     Bilirubin, UA negative     Ketones, UA negative     Spec Grav, UA 1.015     Blood, UA negative     pH, UA 5.0     Protein, UA negative     Urobilinogen, UA 0.2     Nitrite, UA negative     Leukocytes, UA Negative      Assessment & Plan:   Acute cystitis without hematuria - Plan: Urine culture  Urgency of urination - Plan: POCT Urinalysis Dipstick  Rx with ABX as below. Drink plenty of fluids and supportive care.  Called pharmacist to confirm prior script.  New Prescriptions     AMOXICILLIN-CLAVULANATE (AUGMENTIN) 875-125 MG PER TABLET    Take 1 tablet by mouth 2 (two) times daily.   Modified Medications   No medications on file   Orders Placed This Encounter  Procedures  . Urine culture  . POCT Urinalysis Dipstick   Follow-up: No Follow-up on file. Unless noted above, the patient is to follow-up if symptoms worsen. Red flags were reviewed with the patient.  Signed,  Maud Deed. Doye Montilla, MD, CAQ Sports Medicine   Discontinued Medications   RED YEAST RICE 600 MG CAPS    Take 2,400 mg by mouth daily.     Current Medications at Discharge:   Medication List       This list is accurate as of: 09/01/13  1:53 PM.  Always use your most recent med list.               amoxicillin-clavulanate 875-125 MG per tablet  Commonly known as:  AUGMENTIN  Take 1 tablet by mouth 2 (two) times daily.     aspirin 81 MG tablet  Take 81 mg by mouth daily.     atorvastatin 10 MG tablet  Commonly known as:  LIPITOR  Take 1 tablet (10 mg total) by mouth at bedtime.     calcium-vitamin D 500-200 MG-UNIT per tablet  Commonly known as:  OSCAL WITH D  Take 1 tablet by mouth daily.     escitalopram 10 MG tablet  Commonly known as:  LEXAPRO  TAKE ONE TABLET BY MOUTH ONCE DAILY     Insulin Glargine 100 UNIT/ML Solostar Pen  Commonly known as:  LANTUS SOLOSTAR  INJECT 22 UNITS INTO THE SKIN DAILY AS DIRECTED     Insulin Pen Needle 31G X 8 MM Misc  Commonly known as:  B-D ULTRAFINE III SHORT PEN  Use as directed Dx:250.00     JANUVIA 100 MG tablet  Generic drug:  sitaGLIPtin  TAKE ONE TABLET BY MOUTH ONCE DAILY     metFORMIN 500 MG 24 hr tablet  Commonly known as:  GLUCOPHAGE-XR  Take 2 tablets (1,000 mg total) by mouth 2 (two) times daily with a meal.     multivitamin tablet  Take 1 tablet by mouth daily.     ONE TOUCH ULTRA TEST test strip  Generic drug:  glucose blood  USE TO CHECK BLOOD SUGAR 3 TIMES DAILY AS DIRECTED     ranitidine 150 MG tablet   Commonly known as:  ZANTAC  Take 150  mg by mouth daily.

## 2013-09-03 ENCOUNTER — Other Ambulatory Visit: Payer: Self-pay | Admitting: Family Medicine

## 2013-09-03 LAB — URINE CULTURE
Colony Count: NO GROWTH
Organism ID, Bacteria: NO GROWTH

## 2013-09-24 ENCOUNTER — Encounter: Payer: Self-pay | Admitting: Family Medicine

## 2013-09-24 ENCOUNTER — Ambulatory Visit (INDEPENDENT_AMBULATORY_CARE_PROVIDER_SITE_OTHER): Payer: Medicare HMO | Admitting: Family Medicine

## 2013-09-24 VITALS — BP 102/62 | HR 60 | Temp 98.2°F | Wt 203.0 lb

## 2013-09-24 DIAGNOSIS — I480 Paroxysmal atrial fibrillation: Secondary | ICD-10-CM

## 2013-09-24 DIAGNOSIS — I4891 Unspecified atrial fibrillation: Secondary | ICD-10-CM

## 2013-09-24 DIAGNOSIS — R55 Syncope and collapse: Secondary | ICD-10-CM

## 2013-09-24 DIAGNOSIS — IMO0001 Reserved for inherently not codable concepts without codable children: Secondary | ICD-10-CM

## 2013-09-24 DIAGNOSIS — E1165 Type 2 diabetes mellitus with hyperglycemia: Secondary | ICD-10-CM

## 2013-09-24 DIAGNOSIS — IMO0002 Reserved for concepts with insufficient information to code with codable children: Secondary | ICD-10-CM

## 2013-09-24 DIAGNOSIS — E7800 Pure hypercholesterolemia, unspecified: Secondary | ICD-10-CM

## 2013-09-24 LAB — CBC WITH DIFFERENTIAL/PLATELET
BASOS PCT: 0 % (ref 0–1)
Basophils Absolute: 0 10*3/uL (ref 0.0–0.1)
Eosinophils Absolute: 0.1 10*3/uL (ref 0.0–0.7)
Eosinophils Relative: 1 % (ref 0–5)
HEMATOCRIT: 35.8 % — AB (ref 36.0–46.0)
HEMOGLOBIN: 12.4 g/dL (ref 12.0–15.0)
LYMPHS ABS: 2.2 10*3/uL (ref 0.7–4.0)
Lymphocytes Relative: 20 % (ref 12–46)
MCH: 28.6 pg (ref 26.0–34.0)
MCHC: 34.6 g/dL (ref 30.0–36.0)
MCV: 82.5 fL (ref 78.0–100.0)
Monocytes Absolute: 0.9 10*3/uL (ref 0.1–1.0)
Monocytes Relative: 8 % (ref 3–12)
NEUTROS PCT: 71 % (ref 43–77)
Neutro Abs: 7.9 10*3/uL — ABNORMAL HIGH (ref 1.7–7.7)
Platelets: 297 10*3/uL (ref 150–400)
RBC: 4.34 MIL/uL (ref 3.87–5.11)
RDW: 14 % (ref 11.5–15.5)
WBC: 11.1 10*3/uL — ABNORMAL HIGH (ref 4.0–10.5)

## 2013-09-24 MED ORDER — METOPROLOL TARTRATE 25 MG PO TABS: 12.5000 mg | ORAL_TABLET | Freq: Two times a day (BID) | ORAL | Status: DC

## 2013-09-24 NOTE — Progress Notes (Addendum)
Pre visit review using our clinic review tool, if applicable. No additional management support is needed unless otherwise documented below in the visit note.  She didn't feel well yesterday, ear pain and ST.  No fevers.    ~4am this AM.  Got up to go the BR.  Urinated, felt a little lightheaded before urinating. Felt much more lightheaded, sweaty after urinating.  No LOC.   Couldn't check her sugar initially since she was jittery, after a snack it was 135.  The event was witnessed by her husband.  After about 45 minutes she was feeling some better.    She is a little lightheaded now on standing.  No cold meds or other changes.  No etoh.    She has occ low sugars, down to 40s once.    Meds, vitals, and allergies reviewed.   ROS: See HPI.  Otherwise, noncontributory.  GEN: nad, alert and oriented HEENT: mucous membranes moist, nasal exam with clear discharge, ETD noted on exam.  NECK: supple w/o LA CV: Ectopy occ noted on exam PULM: ctab, no inc wob ABD: soft, +bs EXT: no edema SKIN: no acute rash  EKG reviewed.

## 2013-09-24 NOTE — Patient Instructions (Signed)
A fib.   Start 1/2 tab of metoprolol twice a day.  Drink plenty of fluids but avoid caffeine.  Don't get up quickly.  If short of breath or chest pain or a very high heart rate, then go to the ER.  [name] will call about your referral. Update [name] on Monday.  Take care.

## 2013-09-25 LAB — COMPREHENSIVE METABOLIC PANEL
ALBUMIN: 4 g/dL (ref 3.5–5.2)
ALT: 15 U/L (ref 0–35)
AST: 13 U/L (ref 0–37)
Alkaline Phosphatase: 61 U/L (ref 39–117)
BUN: 12 mg/dL (ref 6–23)
CALCIUM: 9.3 mg/dL (ref 8.4–10.5)
CO2: 28 mEq/L (ref 19–32)
CREATININE: 0.94 mg/dL (ref 0.50–1.10)
Chloride: 103 mEq/L (ref 96–112)
GLUCOSE: 151 mg/dL — AB (ref 70–99)
Potassium: 4.4 mEq/L (ref 3.5–5.3)
Sodium: 138 mEq/L (ref 135–145)
Total Bilirubin: 0.4 mg/dL (ref 0.2–1.2)
Total Protein: 6.6 g/dL (ref 6.0–8.3)

## 2013-09-25 LAB — TSH: TSH: 2.885 u[IU]/mL (ref 0.350–4.500)

## 2013-09-26 ENCOUNTER — Encounter: Payer: Self-pay | Admitting: Family Medicine

## 2013-09-26 NOTE — Assessment & Plan Note (Signed)
I thought that she had just had a vagal episode of near syncope after urination due to a combination of factors, ie low sugar, possible URI sx.  That was before I heard the ectopy on exam.  D/w pt.  Should be able to tolerate low dose of BB over the weekend, see notes on labs. If worse, to ER.  Refer to cards.  She agrees.  Okay for outpatient f/u.>25 minutes spent in face to face time with patient, >50% spent in counselling or coordination of care.

## 2013-09-27 ENCOUNTER — Encounter (INDEPENDENT_AMBULATORY_CARE_PROVIDER_SITE_OTHER): Payer: Self-pay

## 2013-09-27 ENCOUNTER — Encounter: Payer: Self-pay | Admitting: Cardiovascular Disease

## 2013-09-27 ENCOUNTER — Ambulatory Visit (INDEPENDENT_AMBULATORY_CARE_PROVIDER_SITE_OTHER): Payer: BC Managed Care – PPO | Admitting: Cardiovascular Disease

## 2013-09-27 VITALS — BP 121/78 | HR 65 | Ht 65.5 in | Wt 202.0 lb

## 2013-09-27 DIAGNOSIS — I480 Paroxysmal atrial fibrillation: Secondary | ICD-10-CM

## 2013-09-27 DIAGNOSIS — R079 Chest pain, unspecified: Secondary | ICD-10-CM

## 2013-09-27 DIAGNOSIS — R0602 Shortness of breath: Secondary | ICD-10-CM

## 2013-09-27 DIAGNOSIS — I4891 Unspecified atrial fibrillation: Secondary | ICD-10-CM

## 2013-09-27 MED ORDER — APIXABAN 5 MG PO TABS: 5.0000 mg | ORAL_TABLET | Freq: Two times a day (BID) | ORAL | Status: DC

## 2013-09-27 NOTE — Progress Notes (Signed)
Primary care physician: Dr. Wyn Quaker  HPI  This is a pleasant 68 year old Caucasian female who was referred by Dr. Damita Dunnings for evaluation of newly diagnosed atrial fibrillation. She has no previous history of arrhythmia. She did undergo previous cardiac catheterization in October of 2010 for atypical chest pain. It showed no significant coronary artery disease with mildly elevated left ventricular end-diastolic pressure and normal ejection fraction. She has known history of diabetes and hyperlipidemia. On Friday around 4 am, she got up to go to the bathroom. She urinated, felt a little lightheaded before urinating. Felt much more lightheaded, sweaty after urinating. She had significant palpitations and feeling clammy. The episode lasted for about 30 minutes. She checked her blood sugar was 135. She saw Dr. Damita Dunnings on Friday and was noted to be in atrial fibrillation with a heart rate of 117 beats per minute. She had labs done which were overall unremarkable including normal thyroid function. She was started on small dose metoprolol 12.5 mg twice daily. She reports no further palpitations. She continues to feel tired with exertional dyspnea but no chest pain. She is a lifelong nonsmoker and does not drink alcohol.    Allergies  Allergen Reactions  . Cortisone   . Pravastatin Sodium     REACTION: leg pain     Current Outpatient Prescriptions on File Prior to Visit  Medication Sig Dispense Refill  . atorvastatin (LIPITOR) 10 MG tablet Take 1 tablet (10 mg total) by mouth at bedtime.  30 tablet  11  . calcium-vitamin D (OSCAL WITH D) 500-200 MG-UNIT per tablet Take 1 tablet by mouth daily.        [name] escitalopram (LEXAPRO) 10 MG tablet TAKE ONE TABLET BY MOUTH ONCE DAILY  30 tablet  5  . Insulin Glargine (LANTUS) 100 UNIT/ML Solostar Pen INJECT 18 UNITS INTO THE SKIN DAILY AS DIRECTED      . Insulin Pen Needle (B-D ULTRAFINE III SHORT PEN) 31G X 8 MM MISC Use as directed Dx:250.00  100  each  6  . JANUVIA 100 MG tablet TAKE ONE TABLET BY MOUTH ONCE DAILY  30 tablet  5  . metFORMIN (GLUCOPHAGE-XR) 500 MG 24 hr tablet Take 2 tablets (1,000 mg total) by mouth 2 (two) times daily with a meal.  60 tablet  5  . metoprolol tartrate (LOPRESSOR) 25 MG tablet Take 0.5 tablets (12.5 mg total) by mouth 2 (two) times daily.  60 tablet  3  . Multiple Vitamin (MULTIVITAMIN) tablet Take 1 tablet by mouth daily.        . ONE TOUCH ULTRA TEST test strip USE TO CHECK BLOOD SUGAR 3 TIMES DAILY AS DIRECTED  100 each  11  . ranitidine (ZANTAC) 150 MG tablet Take 150 mg by mouth daily.         No current facility-administered medications on file prior to visit.     Past Medical History  Diagnosis Date  . Disorders of bursae and tendons in shoulder region, unspecified   . Headache(784.0)   . Other urinary incontinence   . Diverticulosis of colon (without mention of hemorrhage)   . Internal hemorrhoids without mention of complication   . Obesity, unspecified   . Depressive disorder, not elsewhere classified   . Diaphragmatic hernia without mention of obstruction or gangrene   . Pure hypercholesterolemia   . Diabetes mellitus without mention of complication   . Paroxysmal atrial fibrillation      Past Surgical History  Procedure Laterality Date  . Tonsillectomy    .  Cholecystectomy    . Breast surgery      Reduction  . Abdominal hysterectomy  1972    Have both ovaries  . Cardiac catheterization      [facility]     Family History  Problem Relation Age of Onset  . Cancer Mother     fullopian tube  . Dementia Mother   . Pulmonary embolism Mother   . Hyperlipidemia Mother   . Cancer Father     lung  . Hyperlipidemia Father   . Hypertension Sister   . Diabetes Brother   . Stroke Paternal Grandmother   . Stroke Paternal Grandfather      History   Social History  . Marital Status: Married    Spouse Name: N/A    Number of Children: 2  . 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 Smoking status: Never Smoker   . Smokeless tobacco: Never Used  . Alcohol Use: No  . Drug Use: No  . Sexual Activity: Not on file   Other Topics Concern  . Not on file   Social History Narrative   Exercise walks 3 times daily         Diet fruit, occ fast food, salads and lean meat     ROS A 10 point review of system was performed. It is negative other than that mentioned in the history of present illness.   PHYSICAL EXAM   BP 121/78  Pulse 65  Ht 5' 5.5" (1.664 m)  Wt 202 lb (91.627 kg)  BMI 33.09 kg/m2 Constitutional: She is oriented to person, place, and time. She appears well-developed and well-nourished. No distress.  HENT: No nasal discharge.  Head: Normocephalic and atraumatic.  Eyes: Pupils are equal and round. No discharge.  Neck: Normal range of motion. Neck supple. No JVD present. No thyromegaly present.  Cardiovascular: Normal rate, regular rhythm, normal heart sounds. Exam reveals no gallop and no friction rub. No murmur heard.  Pulmonary/Chest: Effort normal and breath sounds normal. No stridor. No respiratory distress. She has no wheezes. She has no rales. She exhibits no tenderness.  Abdominal: Soft. Bowel sounds are normal. She exhibits no distension. There is no tenderness. There is no rebound and no guarding.  Musculoskeletal: Normal range of motion. She exhibits no edema and no tenderness.  Neurological: She is alert and oriented to person, place, and time. Coordination normal.  Skin: Skin is warm and dry. No rash noted. She is not diaphoretic. No erythema. No pallor.  Psychiatric: She has a normal mood and affect. Her behavior is normal. Judgment and thought content normal.     EKG: Normal sinus rhythm with sinus arrhythmia   ASSESSMENT AND PLAN

## 2013-09-27 NOTE — Assessment & Plan Note (Addendum)
The patient is back in normal sinus rhythm. I reviewed EKG from Friday which showed atrial fibrillation with rapid ventricular response. She had no previous documented episodes. I had a prolonged discussion with the patient and her husband about management of atrial fibrillation. I reviewed home blood pressure/heart rate readings. Blood pressure has been on the low side and thus I am not able to increase the dose of metoprolol. CHADS2 VASc score is 3. Thus, long-term anticoagulation is recommended. This was discussed extensively and we discussed different options. I started her on Eliquis 5 mg twice daily. I stopped aspirin. I requested an echocardiogram. If she develops recurrent symptomatic episodes, she will likely require an antiarrhythmic medication.

## 2013-09-27 NOTE — Patient Instructions (Addendum)
Your physician has requested that you have an echocardiogram. Echocardiography is a painless test that uses sound waves to create images of your heart. It provides your doctor with information about the size and shape of your heart and how well your heart's chambers and valves are working. This procedure takes approximately one hour. There are no restrictions for this procedure.   [place]  Your caregiver has ordered a Stress Test with nuclear imaging. The purpose of this test is to evaluate the blood supply to your heart muscle. This procedure is referred to as a "Non-Invasive Stress Test." This is because other than having an IV started in your vein, nothing is inserted or "invades" your body. Cardiac stress tests are done to find areas of poor blood flow to the heart by determining the extent of coronary artery disease (CAD). Some patients exercise on a treadmill, which naturally increases the blood flow to your heart, while others who are  unable to walk on a treadmill due to physical limitations have a pharmacologic/chemical stress agent called Lexiscan . This medicine will mimic walking on a treadmill by temporarily increasing your coronary blood flow.   Please note: these test may take anywhere between 2-4 hours to complete  PLEASE REPORT TO [place] AT THE FIRST DESK WILL DIRECT YOU WHERE TO GO  Date of Procedure:______7/30/15_______________________________  Arrival Time for Procedure:_______0745 am _______________________  Instructions regarding medication:   __x__ : Hold diabetes medication morning of procedure  _x___:  Hold betablocker(s) night before procedure and morning of procedure: Metoprolol     PLEASE NOTIFY THE OFFICE AT LEAST 24 HOURS IN ADVANCE IF YOU ARE UNABLE TO KEEP YOUR APPOINTMENT.  [phone] AND  PLEASE NOTIFY NUCLEAR MEDICINE AT [facility] AT LEAST 24 HOURS IN ADVANCE IF YOU ARE UNABLE TO KEEP YOUR APPOINTMENT.  [phone]  How to prepare for your Myoview test:  1. Do not eat or drink after midnight 2. No caffeine for 24 hours prior to test 3. No smoking 24 hours prior to test. 4. Your medication may be taken with water.  If your doctor stopped a medication because of this test, do not take that medication. 5. Ladies, please do not wear dresses.  Skirts or pants are appropriate. Please wear a short sleeve shirt. 6. No perfume, cologne or lotion. 7. Wear comfortable walking shoes. No heels!       Your physician has recommended you make the following change in your medication:  Start Eliquis 5 mg twice daily  Stop Aspirin   Your physician recommends that you schedule a follow-up appointment in:  With Dr. Fletcher Anon after your tests

## 2013-09-27 NOTE — Assessment & Plan Note (Signed)
Given her prolonged history of diabetes, I requested a treadmill nuclear stress test.

## 2013-09-30 ENCOUNTER — Ambulatory Visit: Payer: Self-pay | Admitting: Cardiovascular Disease

## 2013-09-30 ENCOUNTER — Other Ambulatory Visit: Payer: Self-pay

## 2013-09-30 DIAGNOSIS — R0602 Shortness of breath: Secondary | ICD-10-CM

## 2013-09-30 DIAGNOSIS — R079 Chest pain, unspecified: Secondary | ICD-10-CM

## 2013-10-08 ENCOUNTER — Other Ambulatory Visit: Payer: Self-pay

## 2013-10-08 ENCOUNTER — Other Ambulatory Visit (INDEPENDENT_AMBULATORY_CARE_PROVIDER_SITE_OTHER): Payer: BC Managed Care – PPO

## 2013-10-08 DIAGNOSIS — I4891 Unspecified atrial fibrillation: Secondary | ICD-10-CM

## 2013-10-08 DIAGNOSIS — R0602 Shortness of breath: Secondary | ICD-10-CM

## 2013-10-08 DIAGNOSIS — I059 Rheumatic mitral valve disease, unspecified: Secondary | ICD-10-CM

## 2013-10-11 ENCOUNTER — Encounter: Payer: Self-pay | Admitting: Cardiovascular Disease

## 2013-10-11 ENCOUNTER — Ambulatory Visit (INDEPENDENT_AMBULATORY_CARE_PROVIDER_SITE_OTHER): Payer: BC Managed Care – PPO | Admitting: Cardiovascular Disease

## 2013-10-11 VITALS — BP 124/62 | HR 64 | Ht 65.5 in | Wt 203.5 lb

## 2013-10-11 DIAGNOSIS — I340 Nonrheumatic mitral (valve) insufficiency: Secondary | ICD-10-CM

## 2013-10-11 DIAGNOSIS — I4891 Unspecified atrial fibrillation: Secondary | ICD-10-CM

## 2013-10-11 DIAGNOSIS — I480 Paroxysmal atrial fibrillation: Secondary | ICD-10-CM

## 2013-10-11 DIAGNOSIS — I059 Rheumatic mitral valve disease, unspecified: Secondary | ICD-10-CM

## 2013-10-11 NOTE — Patient Instructions (Signed)
Continue same medications.   Your physician wants you to follow-up in: 6 months.  You will receive a reminder letter in the mail two months in advance. If you don't receive a letter, please call our office to schedule the follow-up appointment.  

## 2013-10-11 NOTE — Assessment & Plan Note (Signed)
She is maintaining a normal sinus rhythm. She does complain of fatigue after she was started on metoprolol. The dose cannot be increased due to baseline bradycardia and fatigue. I discussed the option of switching her to diltiazem but she wants to stay on metoprolol for now and give it more time. She reports no side effects to anticoagulation. If she develops recurrent symptomatic atrial fibrillation, she will likely require an antiarrhythmic medication or catheter ablation.

## 2013-10-11 NOTE — Progress Notes (Signed)
Primary care physician: Dr. Wyn Quaker  HPI  This is a pleasant 68 year old Caucasian female who is here today for followup visit regarding paroxysmal atrial fibrillation. She has no previous history of arrhythmia. She had previous cardiac catheterization in October of 2010 for atypical chest pain. It showed no significant coronary artery disease with mildly elevated left ventricular end-diastolic pressure and normal ejection fraction. She has known history of diabetes and hyperlipidemia. She was seen recently for newly diagnosed atrial fibrillation. She converted to sinus rhythm on small dose metoprolol. She was started on anticoagulation with Eliquis. 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 She underwent a pharmacologic nuclear stress test for symptoms of fatigue and shortness of breath. It showed no evidence of ischemia. Overall, she has been doing reasonably well and denies chest pain. She has very brief palpitations but no prolonged tachycardia.   Allergies  Allergen Reactions  . Cortisone   . Pravastatin Sodium     REACTION: leg pain     Current Outpatient Prescriptions on File Prior to Visit  Medication Sig Dispense Refill  . apixaban (ELIQUIS) 5 MG TABS tablet Take 1 tablet (5 mg total) by mouth 2 (two) times daily.  60 tablet  6  . atorvastatin (LIPITOR) 10 MG tablet Take 1 tablet (10 mg total) by mouth at bedtime.  30 tablet  11  . calcium-vitamin D (OSCAL WITH D) 500-200 MG-UNIT per tablet Take 1 tablet by mouth daily.        [name] escitalopram (LEXAPRO) 10 MG tablet TAKE ONE TABLET BY MOUTH ONCE DAILY  30 tablet  5  . Insulin Glargine (LANTUS) 100 UNIT/ML Solostar Pen INJECT 18 UNITS INTO THE SKIN DAILY AS DIRECTED      . Insulin Pen Needle (B-D ULTRAFINE III SHORT PEN) 31G X 8 MM MISC Use as directed Dx:250.00  100 each  6  . JANUVIA 100 MG tablet TAKE ONE TABLET BY  MOUTH ONCE DAILY  30 tablet  5  . metFORMIN (GLUCOPHAGE-XR) 500 MG 24 hr tablet Take 2 tablets (1,000 mg total) by mouth 2 (two) times daily with a meal.  60 tablet  5  . metoprolol tartrate (LOPRESSOR) 25 MG tablet Take 0.5 tablets (12.5 mg total) by mouth 2 (two) times daily.  60 tablet  3  . Multiple Vitamin (MULTIVITAMIN) tablet Take 1 tablet by mouth daily.        . ONE TOUCH ULTRA TEST test strip USE TO CHECK BLOOD SUGAR 3 TIMES DAILY AS DIRECTED  100 each  11  . ranitidine (ZANTAC) 150 MG tablet Take 150 mg by mouth daily.         No current facility-administered medications on file prior to visit.     Past Medical History  Diagnosis Date  . Disorders of bursae and tendons in shoulder region, unspecified   . Headache(784.0)   . Other urinary incontinence   . Diverticulosis of colon (without mention of hemorrhage)   . Internal hemorrhoids without mention of complication   . Obesity, unspecified   . Depressive disorder, not elsewhere classified   . Diaphragmatic hernia without mention of obstruction or gangrene   . Pure hypercholesterolemia   . Diabetes mellitus without mention of complication   . Paroxysmal atrial fibrillation      Past Surgical History  Procedure Laterality Date  . Tonsillectomy    . Cholecystectomy    . Breast surgery  Reduction  . Abdominal hysterectomy  1972    Have both ovaries  . Cardiac catheterization      [facility]     Family History  Problem Relation Age of Onset  . Cancer Mother     fullopian tube  . Dementia Mother   . Pulmonary embolism Mother   . Hyperlipidemia Mother   . Cancer Father     lung  . Hyperlipidemia Father   . Hypertension Sister   . Diabetes Brother   . Stroke Paternal Grandmother   . Stroke Paternal Grandfather      History   Social History  . Marital Status: Married    Spouse Name: N/A    Number of Children: 2  . 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 Smoking status: Never Smoker   . Smokeless tobacco: Never Used  . Alcohol Use: No  . Drug Use: No  . Sexual Activity: Not on file   Other Topics Concern  . Not on file   Social History Narrative   Exercise walks 3 times daily         Diet fruit, occ fast food, salads and lean meat     ROS A 10 point review of system was performed. It is negative other than that mentioned in the history of present illness.   PHYSICAL EXAM   BP 124/62  Pulse 64  Ht 5' 5.5" (1.664 m)  Wt 203 lb 8 oz (92.307 kg)  BMI 33.34 kg/m2 Constitutional: She is oriented to person, place, and time. She appears well-developed and well-nourished. No distress.  HENT: No nasal discharge.  Head: Normocephalic and atraumatic.  Eyes: Pupils are equal and round. No discharge.  Neck: Normal range of motion. Neck supple. No JVD present. No thyromegaly present.  Cardiovascular: Normal rate, regular rhythm, normal heart sounds. Exam reveals no gallop and no friction rub. No murmur heard.  Pulmonary/Chest: Effort normal and breath sounds normal. No stridor. No respiratory distress. She has no wheezes. She has no rales. She exhibits no tenderness.  Abdominal: Soft. Bowel sounds are normal. She exhibits no distension. There is no tenderness. There is no rebound and no guarding.  Musculoskeletal: Normal range of motion. She exhibits no edema and no tenderness.  Neurological: She is alert and oriented to person, place, and time. Coordination normal.  Skin: Skin is warm and dry. No rash noted. She is not diaphoretic. No erythema. No pallor.  Psychiatric: She has a normal mood and affect. Her behavior is normal. Judgment and thought content normal.     EKG: Sinus  Rhythm  - occasional PAC    # PACs = 1. WITHIN NORMAL LIMITS   ASSESSMENT AND PLAN

## 2013-10-11 NOTE — Assessment & Plan Note (Signed)
This was moderate by echo and due to prolapse of the anterior mitral leaflet. She does not have a murmur by exam. Continued observation. Repeat echocardiogram in 2 years.

## 2013-10-21 ENCOUNTER — Other Ambulatory Visit: Payer: Self-pay | Admitting: *Deleted

## 2013-10-21 ENCOUNTER — Telehealth: Payer: Self-pay | Admitting: Internal Medicine

## 2013-10-21 MED ORDER — METFORMIN HCL ER 500 MG PO TB24: 1000.0000 mg | ORAL_TABLET | Freq: Two times a day (BID) | ORAL | Status: DC

## 2013-10-21 NOTE — Telephone Encounter (Signed)
rx sent

## 2013-10-21 NOTE — Telephone Encounter (Signed)
Patient would like refill on her metformin XR  Doasge: 1000 mg 2x daily   Pharmacy: [facility] [phone]  Thank you

## 2013-10-23 ENCOUNTER — Other Ambulatory Visit: Payer: Self-pay | Admitting: Family Medicine

## 2013-11-09 ENCOUNTER — Telehealth: Payer: Self-pay

## 2013-11-09 NOTE — Telephone Encounter (Signed)
Pt said she saw Dr Cruzita Lederer in 08/2013 and Metformin was changed; pt request new rx for metformin with new instructions; pt was to f/u with Dr Cruzita Lederer in 2 months from 08/2013 visit and pt will contact Dr Arman Filter office about changing metformin rx.

## 2013-11-10 ENCOUNTER — Other Ambulatory Visit: Payer: Self-pay | Admitting: *Deleted

## 2013-11-10 MED ORDER — METFORMIN HCL ER 500 MG PO TB24: 1000.0000 mg | ORAL_TABLET | Freq: Two times a day (BID) | ORAL | Status: DC

## 2013-11-12 ENCOUNTER — Ambulatory Visit: Payer: Medicare HMO | Admitting: Internal Medicine

## 2013-12-02 ENCOUNTER — Telehealth: Payer: Self-pay | Admitting: Family Medicine

## 2013-12-02 NOTE — Telephone Encounter (Signed)
Pt called requesting to see if it is ok for her to get the shingles vaccine? Please advise.

## 2013-12-03 NOTE — Telephone Encounter (Signed)
Yes okay to get shingles vaccine

## 2013-12-03 NOTE — Telephone Encounter (Signed)
Left message for [name] that it is okay for her to get the shingle vaccine per Dr. Diona Browner.

## 2013-12-07 ENCOUNTER — Ambulatory Visit (INDEPENDENT_AMBULATORY_CARE_PROVIDER_SITE_OTHER): Payer: BC Managed Care – PPO

## 2013-12-07 DIAGNOSIS — Z23 Encounter for immunization: Secondary | ICD-10-CM

## 2013-12-13 ENCOUNTER — Encounter: Payer: Self-pay | Admitting: Internal Medicine

## 2013-12-13 ENCOUNTER — Ambulatory Visit (INDEPENDENT_AMBULATORY_CARE_PROVIDER_SITE_OTHER): Payer: BC Managed Care – PPO | Admitting: Internal Medicine

## 2013-12-13 ENCOUNTER — Other Ambulatory Visit: Payer: Self-pay | Admitting: Internal Medicine

## 2013-12-13 VITALS — BP 132/74 | HR 59 | Temp 98.1°F | Resp 12 | Wt 202.0 lb

## 2013-12-13 DIAGNOSIS — E1165 Type 2 diabetes mellitus with hyperglycemia: Secondary | ICD-10-CM

## 2013-12-13 DIAGNOSIS — IMO0002 Reserved for concepts with insufficient information to code with codable children: Secondary | ICD-10-CM

## 2013-12-13 LAB — HEMOGLOBIN A1C
HEMOGLOBIN A1C: 6.7 % — AB (ref <5.7)
Mean Plasma Glucose: 146 mg/dL — ABNORMAL HIGH (ref <117)

## 2013-12-13 NOTE — Patient Instructions (Signed)
Please continue: - Metformin XR 1000 mg in am and 500 (1000) mg in pm as tolerated - Lantus 18 units at bedtime  - Januvia 100 mg daily in am.  Please stop at [facility] downstairs.  Please return in 3 month with your sugar log.

## 2013-12-13 NOTE — Progress Notes (Signed)
Patient ID: [name], female   DOB: 1945-11-23, 68 y.o.   MRN: [id number]  HPI: [name] is a 68 y.o.-year-old female, referred by her PCP, Dr. Diona Browner, for management of DM2, insulin-dependent, uncontrolled, without complications.  She had an episode of Afib >> started Eliquis and Toprol.  Patient has been diagnosed with diabetes in 1995; she started on insulin last year. Last hemoglobin A1c was: Lab Results  Component Value Date   HGBA1C 8.1* 08/10/2013   HGBA1C 7.9* 05/04/2013   HGBA1C 8.3* 01/18/2013   Pt is on a regimen of: - Metformin XR 500 mg >> 1000 mg in am and 500 mg in pm - Lantus 22 >> 18 units in am >> hs - Januvia 100 mg daily  Pt checks her sugars 2 a day and they are (per meter download)- ave 113: - am: 87-149 >> 86-117 - 2h after b'fast: 112-139 >> 117-145  - before lunch: 77-150 >> 113-137 - 2h after lunch: 65-188 >> 96-163 - before dinner: 95-143 >> 97-103 - 2h after dinner: 118 >> 131-174 - bedtime: 95-161 (203) >> 117-146, 270x1 - nighttime: n/c No lows. Lowest sugar was 98; she has hypoglycemia awareness at 87.  Highest sugar was 270.  Pt's meals are: - Breakfast: cereals + 2% milk; bread + cheese; bread + PB - Lunch: sandwich; soup - Dinner: chicken; steak; seafood - Snacks: 2: PB crackers  She walks 5x a week.  - no CKD, last BUN/creatinine:  Lab Results  Component Value Date   BUN 12 09/24/2013   CREATININE 0.94 09/24/2013   - last set of lipids: Lab Results  Component Value Date   CHOL 147 08/10/2013   HDL 44.50 08/10/2013   LDLCALC 95 08/10/2013   LDLDIRECT 163.1 07/04/2011   TRIG 39.0 08/10/2013   CHOLHDL 3 08/10/2013  On Red yeast rice and Lipitor.  - last eye exam was in 08/2013. No DR.  - no numbness and tingling in her feet.  I reviewed pt's medications, allergies, PMH, social hx, family hx and no changes required, except as mentioned above.  ROS: Constitutional: no weight gain/loss, increased appetite, no fatigue, no subjective  hyperthermia/hypothermia, no excessive urination  Eyes: no blurry vision, no xerophthalmia ENT: no sore throat, no nodules palpated in throat, no dysphagia/odynophagia, no hoarseness Cardiovascular: no CP/SOB/+ palpitations/no leg swelling Respiratory: no cough/SOB Gastrointestinal: no N/V/D/C Musculoskeletal: no muscle/joint aches Skin: no rashes Neurological: no tremors/numbness/tingling/dizziness  PE: BP 132/74  Pulse 59  Temp(Src) 98.1 F (36.7 C) (Oral)  Resp 12  Wt 202 lb (91.627 kg)  SpO2 97% Wt Readings from Last 3 Encounters:  12/13/13 202 lb (91.627 kg)  10/11/13 203 lb 8 oz (92.307 kg)  09/27/13 202 lb (91.627 kg)   Constitutional: overweight, in NAD Eyes: PERRLA, EOMI, no exophthalmos ENT: moist mucous membranes, no thyromegaly, no cervical lymphadenopathy Cardiovascular: RRR, No MRG Respiratory: CTA B Gastrointestinal: abdomen soft, NT, ND, BS+ Musculoskeletal: no deformities, strength intact in all 4 Skin: moist, warm, no rashes Neurological: no tremor with outstretched hands, DTR normal in all 4  ASSESSMENT: 1. DM2, insulin-dependent, uncontrolled, without complications  PLAN:  1. Patient with long-standing,suboptimally controlled diabetes, on basal insulin and oral antidiabetic regimen, now with improved control. She plans to start exercising. - We discussed about options for treatment, and I suggested to:  Patient Instructions  Please continue: - Metformin XR 1000 mg in am and 500 (1000) mg in pm as tolerated - Lantus 18 units at bedtime  -  Januvia 100 mg daily in am. Please stop at [facility] lab downstairs. Please return in 3 month with your sugar log. - continue checking sugars at different times of the day - check 2 times a day, rotating checks - advised for yearly eye exams, she is up to date - she had the flu shot this season - will check Hba1c today - Return to clinic in 3 mo with sugar log   Orders Only on 12/13/2013  Component Date Value Ref  Range Status  . Hemoglobin A1C 12/13/2013 6.7* <5.7 % Final   Comment:                                                                                                 According to the ADA Clinical Practice Recommendations for 2011, when                          HbA1c is used as a screening test:                                                       >=6.5%   Diagnostic of Diabetes Mellitus                                     (if abnormal result is confirmed)                                                     5.7-6.4%   Increased risk of developing Diabetes Mellitus                                                     References:Diagnosis and Classification of Diabetes Mellitus,Diabetes                          OEVO,3500,93(GHWEX 1):S62-S69 and Standards of Medical Care in                                  Diabetes - 2011,Diabetes HBZJ,6967,89 (Suppl 1):S11-S61.                             . Mean Plasma Glucose 12/13/2013 146* <117 mg/dL Final   Excellent HbA1c!

## 2014-01-10 ENCOUNTER — Other Ambulatory Visit: Payer: Self-pay | Admitting: Family Medicine

## 2014-01-12 ENCOUNTER — Telehealth: Payer: Self-pay

## 2014-01-12 ENCOUNTER — Ambulatory Visit: Payer: BC Managed Care – PPO

## 2014-01-12 NOTE — Telephone Encounter (Signed)
Pt came today for shingles vaccine; pt has not contacted ins co about shingles vaccine; pt cancelled nurse visit today and will contact ins co about coverage for shingles vaccine and then pt will cb for nurse visit appt or rx sent to local pharmacy.

## 2014-02-08 ENCOUNTER — Other Ambulatory Visit: Payer: Self-pay | Admitting: *Deleted

## 2014-02-08 MED ORDER — METFORMIN HCL ER 500 MG PO TB24: 1000.0000 mg | ORAL_TABLET | Freq: Two times a day (BID) | ORAL | Status: DC

## 2014-02-09 ENCOUNTER — Other Ambulatory Visit: Payer: Self-pay | Admitting: *Deleted

## 2014-02-09 MED ORDER — METFORMIN HCL ER 500 MG PO TB24: 1000.0000 mg | ORAL_TABLET | Freq: Two times a day (BID) | ORAL | Status: DC

## 2014-02-09 NOTE — Telephone Encounter (Signed)
Refill did not go through. Resending.

## 2014-03-15 ENCOUNTER — Ambulatory Visit (INDEPENDENT_AMBULATORY_CARE_PROVIDER_SITE_OTHER): Payer: BC Managed Care – PPO | Admitting: Internal Medicine

## 2014-03-15 ENCOUNTER — Encounter: Payer: Self-pay | Admitting: Internal Medicine

## 2014-03-15 VITALS — BP 114/66 | HR 74 | Temp 98.5°F | Resp 12 | Wt 199.0 lb

## 2014-03-15 DIAGNOSIS — IMO0002 Reserved for concepts with insufficient information to code with codable children: Secondary | ICD-10-CM

## 2014-03-15 DIAGNOSIS — E1165 Type 2 diabetes mellitus with hyperglycemia: Secondary | ICD-10-CM

## 2014-03-15 LAB — HEMOGLOBIN A1C: Hgb A1c MFr Bld: 7.2 % — ABNORMAL HIGH (ref 4.6–6.5)

## 2014-03-15 NOTE — Patient Instructions (Signed)
Please continue: - Metformin XR 1000 mg in am and 500 mg in pm as tolerated. Can increase to 1000 mg at night if sugars in am > 130 consistently. - Lantus 18 units at bedtime  - Januvia 100 mg daily in am. Please stop at the lab. Please return in 3 month with your sugar log

## 2014-03-15 NOTE — Progress Notes (Signed)
Patient ID: [name], female   DOB: Oct 28, 1945, 69 y.o.   MRN: [id number]  HPI: [name] is a 69 y.o.-year-old female, returning for f/u for DM2, dx 1995, insulin-dependent since 2014, uncontrolled, without complications. Last visit 3 mo ago.   Last hemoglobin A1c was: Lab Results  Component Value Date   HGBA1C 6.7* 12/13/2013   HGBA1C 8.1* 08/10/2013   HGBA1C 7.9* 05/04/2013   Pt is on a regimen of: - Metformin XR 500 mg >> 1000 mg in am and 500 mg in pm - Lantus 22 >> 18 units in am >> hs - Januvia 100 mg daily  Pt checks her sugars 2 a day and they are (per meter download)- ave 113: - am: 87-149 >> 86-117 >> 89-130, 150 - 2h after b'fast: 112-139 >> 117-145 >> 120-146 - before lunch: 77-150 >> 113-137 >> 70-120 - 2h after lunch: 65-188 >> 96-163 >> 138-158, 184 - before dinner: 95-143 >> 97-103 >> 86-120 - 2h after dinner: 118 >> 131-174 >> 88x1, 120-188 - bedtime: 95-161 (203) >> 117-146, 270x1 >> n/c - nighttime: n/c No lows. Lowest sugar was 70x1; she has hypoglycemia awareness at 87.  Highest sugar was 270 >> 188  Pt's meals are: - Breakfast: cereals + 2% milk; bread + cheese; bread + PB - Lunch: sandwich; soup - Dinner: chicken; steak; seafood - Snacks: 2: PB crackers  She walks 5x a week.  - no CKD, last BUN/creatinine:  Lab Results  Component Value Date   BUN 12 09/24/2013   CREATININE 0.94 09/24/2013   - last set of lipids: Lab Results  Component Value Date   CHOL 147 08/10/2013   HDL 44.50 08/10/2013   LDLCALC 95 08/10/2013   LDLDIRECT 163.1 07/04/2011   TRIG 39.0 08/10/2013   CHOLHDL 3 08/10/2013  On Red yeast rice and Lipitor.  - last eye exam was in 08/2013. No DR.  - no numbness and tingling in her feet.  I reviewed pt's medications, allergies, PMH, social hx, family hx, and changes were documented in the history of present illness. Otherwise, unchanged from my initial visit note.  ROS: Constitutional: no weight gain/loss, increased  appetite, no fatigue, no subjective hyperthermia/hypothermia, no excessive urination  Eyes: no blurry vision, no xerophthalmia ENT: no sore throat, no nodules palpated in throat, no dysphagia/odynophagia, no hoarseness Cardiovascular: no CP/SOB/palpitations/no leg swelling Respiratory: no cough/SOB Gastrointestinal: no N/V/D/C Musculoskeletal: no muscle/joint aches Skin: no rashes Neurological: no tremors/numbness/tingling/dizziness  PE: BP 114/66 mmHg  Pulse 74  Temp(Src) 98.5 F (36.9 C) (Oral)  Resp 12  Wt 199 lb (90.266 kg)  SpO2 95% Wt Readings from Last 3 Encounters:  03/15/14 199 lb (90.266 kg)  12/13/13 202 lb (91.627 kg)  10/11/13 203 lb 8 oz (92.307 kg)   Constitutional: overweight, in NAD Eyes: PERRLA, EOMI, no exophthalmos ENT: moist mucous membranes, no thyromegaly, no cervical lymphadenopathy Cardiovascular: RRR, No MRG Respiratory: CTA B Gastrointestinal: abdomen soft, NT, ND, BS+ Musculoskeletal: no deformities, strength intact in all 4 Skin: moist, warm, no rashes Neurological: no tremor with outstretched hands, DTR normal in all 4  ASSESSMENT: 1. DM2, insulin-dependent, uncontrolled, without complications  PLAN:  1. Patient with long-standing,suboptimally controlled diabetes, now with better control on basal insulin and oral antidiabetic regimen. Sugars a little higher in am, OTW at goal. - We discussed about options for treatment, and I suggested to:  Patient Instructions  Please continue: - Metformin XR 1000 mg in am and 500 mg in pm as  tolerated. Can increase to 1000 mg at night if sugars in am > 130 consistently. - Lantus 18 units at bedtime  - Januvia 100 mg daily in am. Please stop at the lab. Please return in 3 month with your sugar log  - continue checking sugars at different times of the day - check 2 times a day, rotating checks - advised for yearly eye exams, she is up to date - she had the flu shot this season - will check Hba1c today -  Return to clinic in 3 mo with sugar log   Office Visit on 03/15/2014  Component Date Value Ref Range Status  . Hgb A1c MFr Bld 03/15/2014 7.2* 4.6 - 6.5 % Final   Glycemic Control Guidelines for People with Diabetes:Non Diabetic:  <6%Goal of Therapy: <7%Additional Action Suggested:  >8%   HbA1c higher >> see instr. above

## 2014-03-29 ENCOUNTER — Other Ambulatory Visit: Payer: Self-pay | Admitting: Family Medicine

## 2014-04-11 ENCOUNTER — Ambulatory Visit (INDEPENDENT_AMBULATORY_CARE_PROVIDER_SITE_OTHER): Payer: BC Managed Care – PPO | Admitting: Cardiovascular Disease

## 2014-04-11 ENCOUNTER — Other Ambulatory Visit: Payer: Self-pay | Admitting: Family Medicine

## 2014-04-11 ENCOUNTER — Encounter: Payer: Self-pay | Admitting: Cardiovascular Disease

## 2014-04-11 ENCOUNTER — Encounter (INDEPENDENT_AMBULATORY_CARE_PROVIDER_SITE_OTHER): Payer: Self-pay

## 2014-04-11 VITALS — BP 110/64 | HR 60 | Ht 65.5 in | Wt 198.2 lb

## 2014-04-11 DIAGNOSIS — E7800 Pure hypercholesterolemia, unspecified: Secondary | ICD-10-CM

## 2014-04-11 DIAGNOSIS — I340 Nonrheumatic mitral (valve) insufficiency: Secondary | ICD-10-CM

## 2014-04-11 DIAGNOSIS — I480 Paroxysmal atrial fibrillation: Secondary | ICD-10-CM

## 2014-04-11 MED ORDER — METOPROLOL TARTRATE 25 MG PO TABS: 12.5000 mg | ORAL_TABLET | Freq: Two times a day (BID) | ORAL | Status: DC

## 2014-04-11 MED ORDER — APIXABAN 5 MG PO TABS: 5.0000 mg | ORAL_TABLET | Freq: Two times a day (BID) | ORAL | Status: DC

## 2014-04-11 NOTE — Patient Instructions (Signed)
Continue same medications.   Your physician wants you to follow-up in: 9 months.  You will receive a reminder letter in the mail two months in advance. If you don't receive a letter, please call our office to schedule the follow-up appointment.  

## 2014-04-11 NOTE — Progress Notes (Signed)
Primary care physician: Dr. Wyn Quaker  HPI  This is a pleasant 69 year old Caucasian female who is here today for followup visit regarding paroxysmal atrial fibrillation.  She had previous cardiac catheterization in October of 2010 for atypical chest pain. It showed no significant coronary artery disease with mildly elevated left ventricular end-diastolic pressure and normal ejection fraction. She has known history of diabetes and hyperlipidemia. She was seen in July of 2015 for newly diagnosed atrial fibrillation. She converted to sinus rhythm on small dose metoprolol. She was started on anticoagulation with Eliquis. 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 She underwent a pharmacologic nuclear stress test for symptoms of fatigue and shortness of breath. It showed no evidence of ischemia. Overall, she has been doing reasonably well and denies chest pain. She has very brief palpitations but no prolonged tachycardia.   Allergies  Allergen Reactions  . Cortisone   . Pravastatin Sodium     REACTION: leg pain     Current Outpatient Prescriptions on File Prior to Visit  Medication Sig Dispense Refill  . apixaban (ELIQUIS) 5 MG TABS tablet Take 1 tablet (5 mg total) by mouth 2 (two) times daily. 60 tablet 6  . atorvastatin (LIPITOR) 10 MG tablet Take 1 tablet (10 mg total) by mouth at bedtime. 30 tablet 11  . calcium-vitamin D (OSCAL WITH D) 500-200 MG-UNIT per tablet Take 1 tablet by mouth daily.      . Insulin Glargine (LANTUS) 100 UNIT/ML Solostar Pen INJECT 18 UNITS INTO THE SKIN DAILY AS DIRECTED    . Insulin Pen Needle (B-D ULTRAFINE III SHORT PEN) 31G X 8 MM MISC Use as directed Dx:250.00 100 each 6  . JANUVIA 100 MG tablet TAKE 1 TABLET BY MOUTH DAILY 30 tablet 2  . metFORMIN (GLUCOPHAGE-XR) 500 MG 24 hr tablet Take 2 tablets (1,000 mg total) by mouth 2 (two) times daily. 120  tablet 1  . metoprolol tartrate (LOPRESSOR) 25 MG tablet Take 0.5 tablets (12.5 mg total) by mouth 2 (two) times daily. 60 tablet 3  . Multiple Vitamin (MULTIVITAMIN) tablet Take 1 tablet by mouth daily.      . ONE TOUCH ULTRA TEST test strip USE TO CHECK BLOOD SUGAR 3 TIMES DAILY AS DIRECTED 100 each 11  . ranitidine (ZANTAC) 150 MG tablet Take 150 mg by mouth daily.       No current facility-administered medications on file prior to visit.     Past Medical History  Diagnosis Date  . Disorders of bursae and tendons in shoulder region, unspecified   . Headache(784.0)   . Other urinary incontinence   . Diverticulosis of colon (without mention of hemorrhage)   . Internal hemorrhoids without mention of complication   . Obesity, unspecified   . Depressive disorder, not elsewhere classified   . Diaphragmatic hernia without mention of obstruction or gangrene   . Pure hypercholesterolemia   . Diabetes mellitus without mention of complication   . Paroxysmal atrial fibrillation      Past Surgical History  Procedure Laterality Date  . Tonsillectomy    . Cholecystectomy    . Breast surgery      Reduction  . Abdominal hysterectomy  1972    Have both ovaries  . Cardiac catheterization      [facility]     Family History  Problem Relation Age of Onset  . Cancer Mother     fullopian tube  . Dementia  Mother   . Pulmonary embolism Mother   . Hyperlipidemia Mother   . Cancer Father     lung  . Hyperlipidemia Father   . Hypertension Sister   . Diabetes Brother   . Stroke Paternal Grandmother   . Stroke Paternal Grandfather      History   Social History  . Marital Status: Married    Spouse Name: N/A    Number of Children: 2  . 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 Smoking status: Never Smoker   . Smokeless tobacco: Never Used  . Alcohol Use: No  . Drug Use: No  . Sexual Activity: Not on file   Other Topics  Concern  . Not on file   Social History Narrative   Exercise walks 3 times daily         Diet fruit, occ fast food, salads and lean meat     ROS A 10 point review of system was performed. It is negative other than that mentioned in the history of present illness.   PHYSICAL EXAM   BP 110/64 mmHg  Pulse 60  Ht 5' 5.5" (1.664 m)  Wt 198 lb 4 oz (89.926 kg)  BMI 32.48 kg/m2 Constitutional: She is oriented to person, place, and time. She appears well-developed and well-nourished. No distress.  HENT: No nasal discharge.  Head: Normocephalic and atraumatic.  Eyes: Pupils are equal and round. No discharge.  Neck: Normal range of motion. Neck supple. No JVD present. No thyromegaly present.  Cardiovascular: Normal rate, regular rhythm, normal heart sounds. Exam reveals no gallop and no friction rub. No murmur heard.  Pulmonary/Chest: Effort normal and breath sounds normal. No stridor. No respiratory distress. She has no wheezes. She has no rales. She exhibits no tenderness.  Abdominal: Soft. Bowel sounds are normal. She exhibits no distension. There is no tenderness. There is no rebound and no guarding.  Musculoskeletal: Normal range of motion. She exhibits no edema and no tenderness.  Neurological: She is alert and oriented to person, place, and time. Coordination normal.  Skin: Skin is warm and dry. No rash noted. She is not diaphoretic. No erythema. No pallor.  Psychiatric: She has a normal mood and affect. Her behavior is normal. Judgment and thought content normal.     EKG: Sinus  Rhythm  -With rate variation  cv = 11. WITHIN NORMAL LIMITS    ASSESSMENT AND PLAN

## 2014-04-12 ENCOUNTER — Encounter: Payer: Self-pay | Admitting: Cardiovascular Disease

## 2014-04-12 NOTE — Assessment & Plan Note (Signed)
This was moderate by echo and due to prolapse of the anterior mitral leaflet. She does not have a murmur by exam. Continued observation. Repeat echocardiogram in 2-3 years.

## 2014-04-12 NOTE — Assessment & Plan Note (Signed)
She is maintaining a normal sinus rhythm. She is tolerating Metoprolol better than before.  She reports no side effects to anticoagulation. If she develops recurrent symptomatic atrial fibrillation, she will likely require an antiarrhythmic medication or catheter ablation.

## 2014-05-05 ENCOUNTER — Telehealth: Payer: Self-pay | Admitting: Family Medicine

## 2014-05-05 DIAGNOSIS — E7800 Pure hypercholesterolemia, unspecified: Secondary | ICD-10-CM

## 2014-05-05 DIAGNOSIS — IMO0002 Reserved for concepts with insufficient information to code with codable children: Secondary | ICD-10-CM

## 2014-05-05 DIAGNOSIS — E1165 Type 2 diabetes mellitus with hyperglycemia: Secondary | ICD-10-CM

## 2014-05-05 NOTE — Telephone Encounter (Signed)
-----   Message from [name] Sia sent at 04/27/2014 11:52 AM EST ----- Regarding: Lab orders for Friday, 3.4.16 Patient is scheduled for CPX labs, please order future labs, Thanks , Karna Christmas

## 2014-05-06 ENCOUNTER — Other Ambulatory Visit (INDEPENDENT_AMBULATORY_CARE_PROVIDER_SITE_OTHER): Payer: BLUE CROSS/BLUE SHIELD

## 2014-05-06 DIAGNOSIS — E7800 Pure hypercholesterolemia, unspecified: Secondary | ICD-10-CM

## 2014-05-06 DIAGNOSIS — E1165 Type 2 diabetes mellitus with hyperglycemia: Secondary | ICD-10-CM

## 2014-05-06 DIAGNOSIS — IMO0002 Reserved for concepts with insufficient information to code with codable children: Secondary | ICD-10-CM

## 2014-05-06 LAB — COMPREHENSIVE METABOLIC PANEL
ALBUMIN: 4.1 g/dL (ref 3.5–5.2)
ALT: 15 U/L (ref 0–35)
AST: 17 U/L (ref 0–37)
Alkaline Phosphatase: 55 U/L (ref 39–117)
BUN: 17 mg/dL (ref 6–23)
CO2: 30 mEq/L (ref 19–32)
Calcium: 9.2 mg/dL (ref 8.4–10.5)
Chloride: 105 mEq/L (ref 96–112)
Creatinine, Ser: 0.94 mg/dL (ref 0.40–1.20)
GFR: 63.11 mL/min (ref >60.00)
Glucose, Bld: 98 mg/dL (ref 70–99)
POTASSIUM: 4.5 meq/L (ref 3.5–5.1)
Sodium: 138 mEq/L (ref 135–145)
Total Bilirubin: 0.2 mg/dL (ref 0.2–1.2)
Total Protein: 6.8 g/dL (ref 6.0–8.3)

## 2014-05-06 LAB — LIPID PANEL
CHOL/HDL RATIO: 3
Cholesterol: 134 mg/dL (ref 0–200)
HDL: 44.2 mg/dL (ref >39.00)
LDL Cholesterol: 80 mg/dL (ref 0–99)
NONHDL: 89.8
Triglycerides: 51 mg/dL (ref 0.0–149.0)
VLDL: 10.2 mg/dL (ref 0.0–40.0)

## 2014-05-06 LAB — HEMOGLOBIN A1C: Hgb A1c MFr Bld: 7 % — ABNORMAL HIGH (ref 4.6–6.5)

## 2014-05-13 ENCOUNTER — Encounter: Payer: Self-pay | Admitting: Family Medicine

## 2014-05-13 ENCOUNTER — Ambulatory Visit (INDEPENDENT_AMBULATORY_CARE_PROVIDER_SITE_OTHER): Payer: BLUE CROSS/BLUE SHIELD | Admitting: Family Medicine

## 2014-05-13 VITALS — BP 110/60 | HR 66 | Temp 98.2°F | Ht 65.0 in | Wt 199.5 lb

## 2014-05-13 DIAGNOSIS — IMO0002 Reserved for concepts with insufficient information to code with codable children: Secondary | ICD-10-CM

## 2014-05-13 DIAGNOSIS — Z0000 Encounter for general adult medical examination without abnormal findings: Secondary | ICD-10-CM

## 2014-05-13 DIAGNOSIS — E7800 Pure hypercholesterolemia, unspecified: Secondary | ICD-10-CM

## 2014-05-13 DIAGNOSIS — Z1211 Encounter for screening for malignant neoplasm of colon: Secondary | ICD-10-CM

## 2014-05-13 DIAGNOSIS — R413 Other amnesia: Secondary | ICD-10-CM | POA: Diagnosis not present

## 2014-05-13 DIAGNOSIS — E348 Other specified endocrine disorders: Secondary | ICD-10-CM

## 2014-05-13 DIAGNOSIS — Z1231 Encounter for screening mammogram for malignant neoplasm of breast: Secondary | ICD-10-CM

## 2014-05-13 DIAGNOSIS — E1165 Type 2 diabetes mellitus with hyperglycemia: Secondary | ICD-10-CM | POA: Diagnosis not present

## 2014-05-13 LAB — HM DIABETES EYE EXAM

## 2014-05-13 NOTE — Progress Notes (Signed)
I have personally reviewed the Medicare Annual Wellness questionnaire and have noted 1.The patient's medical and social history 2.Their use of alcohol, tobacco or illicit drugs 3.Their current medications and supplements 4.The patient's functional ability including ADL's, fall risks, home safety risks and hearing or visual  impairment. 5.Diet and physical activities 6.Evidence for depression or mood disorders The patients weight, height, BMI and visual acuity have been recorded in the chart I have made referrals, counseling and provided education to the patient based review of the above and I have provided the pt with a written personalized care plan for preventive services.  She has noted decrease in short term memory in last few years.  Worse in last few month.  No neuro changes.  Family history of alzheimer's. No depression.  Diabetes: followed by ENDO. Dr. Letta Median   Lab Results  Component Value Date   HGBA1C 7.0* 05/06/2014    Elevated Cholesterol:  LDL at goal < 100 on lipitor 10 mg daily Lab Results  Component Value Date   CHOL 134 05/06/2014   HDL 44.20 05/06/2014   LDLCALC 80 05/06/2014   LDLDIRECT 163.1 07/04/2011   TRIG 51.0 05/06/2014   CHOLHDL 3 05/06/2014  Muscle aches: No Diet compliance: Good  Exercise: 3 days a week  Wt Readings from Last 3 Encounters:  05/13/14 199 lb 8 oz (90.493 kg)  04/11/14 198 lb 4 oz (89.926 kg)  03/15/14 199 lb (90.266 kg)     Review of Systems  Constitutional: Negative for fever and some  fatigue.  HENT: Negative for ear pain.  Eyes: Negative for pain.  Respiratory: Negative for chest tightness and shortness of breath.  Cardiovascular: Negative for chest pain, palpitations and leg swelling.  Gastrointestinal: Negative for abdominal pain.  Genitourinary: Negative for dysuria.  Objective:   Physical Exam  Constitutional: Vital signs are normal. She  appears well-developed and well-nourished. She is cooperative. Non-toxic appearance. She does not appear ill. No distress.  HENT:  Head: Normocephalic.  Right Ear: Hearing, tympanic membrane, external ear and ear canal normal. Tympanic membrane is not erythematous, not retracted and not bulging.  Left Ear: Hearing, tympanic membrane, external ear and ear canal normal. Tympanic membrane is not erythematous, not retracted and not bulging.  Nose: No mucosal edema or rhinorrhea. Right sinus exhibits no maxillary sinus tenderness and no frontal sinus tenderness. Left sinus exhibits no maxillary sinus tenderness and no frontal sinus tenderness.  Mouth/Throat: Uvula is midline, oropharynx is clear and moist and mucous membranes are normal.  Eyes: Conjunctivae, EOM and lids are normal. Pupils are equal, round, and reactive to light. No foreign bodies found.  Neck: Trachea normal and normal range of motion. Neck supple. Carotid bruit is not present. No mass and no thyromegaly present.  Cardiovascular: Normal rate, regular rhythm, S1 normal, S2 normal, normal heart sounds, intact distal pulses and normal pulses. Exam reveals no gallop and no friction rub.  No murmur heard.  Pulmonary/Chest: Effort normal and breath sounds normal. Not tachypneic. No respiratory distress. She has no decreased breath sounds. She has no wheezes. She has no rhonchi. She has no rales.  Abdominal: Soft. Normal appearance and bowel sounds are normal. There is no tenderness.  Neurological: She is alert.  Skin: Skin is warm, dry and intact. No rash noted.  Psychiatric: Her speech is normal and behavior is normal. Judgment and thought content normal. Her mood appears not anxious. Cognition and memory are normal. She does not exhibit a depressed mood.  Normal introitus for  age, no external lesions, no vaginal discharge, mucosa pink and moist, no vaginal  no vaginal atrophy, no friaility or hemorrhage, no uterus , no adnexal  masses or tenderness.  No breast lesions, significant scarring stable bilaterally from breast reduction in past. Chaperoned exam.   Assessment and PLAN: The patient's preventative maintenance and recommended screening tests for an annual wellness exam were reviewed in full today. Brought up to date unless services declined.  Counselled on the importance of diet, exercise, and its role in overall health and mortality. The patient's FH and SH was reviewed, including their home life, tobacco status, and drug and alcohol status.   Vaccines: Uptodate with PNA, Tdap, will consider shingles . Mammo: 06/2013, will schedule Colon: 07/2003, Dr. Carlean Purl,  She would like to do ifob instead. DEXA: due  Nonsmoker PAP/DVE: has ovaries, had partial hysterectomy. DVE yearly, no pap.

## 2014-05-13 NOTE — Assessment & Plan Note (Signed)
Well controlled. Continue current medication.  

## 2014-05-13 NOTE — Assessment & Plan Note (Signed)
IMproving control per ENDO.

## 2014-05-13 NOTE — Progress Notes (Signed)
Pre visit review using our clinic review tool, if applicable. No additional management support is needed unless otherwise documented below in the visit note. 

## 2014-05-13 NOTE — Patient Instructions (Addendum)
Schedule an appointment if continue concerns about memory.  Work on increasing exercise.   Stop at front desk to set up mammogram and bone density.  Pick up stool test at lab on way out.

## 2014-05-23 ENCOUNTER — Other Ambulatory Visit (INDEPENDENT_AMBULATORY_CARE_PROVIDER_SITE_OTHER): Payer: PPO

## 2014-05-23 DIAGNOSIS — Z1211 Encounter for screening for malignant neoplasm of colon: Secondary | ICD-10-CM

## 2014-05-23 LAB — FECAL OCCULT BLOOD, IMMUNOCHEMICAL: Fecal Occult Bld: NEGATIVE

## 2014-05-24 ENCOUNTER — Encounter: Payer: Self-pay | Admitting: *Deleted

## 2014-06-06 ENCOUNTER — Encounter: Payer: Self-pay | Admitting: Internal Medicine

## 2014-06-09 ENCOUNTER — Other Ambulatory Visit: Payer: Self-pay | Admitting: Family Medicine

## 2014-06-14 ENCOUNTER — Other Ambulatory Visit: Payer: PPO

## 2014-06-14 ENCOUNTER — Ambulatory Visit: Payer: PPO

## 2014-06-14 ENCOUNTER — Ambulatory Visit (INDEPENDENT_AMBULATORY_CARE_PROVIDER_SITE_OTHER): Payer: PPO | Admitting: Internal Medicine

## 2014-06-14 ENCOUNTER — Encounter: Payer: Self-pay | Admitting: Internal Medicine

## 2014-06-14 VITALS — BP 104/58 | HR 71 | Temp 98.2°F | Resp 12 | Wt 198.0 lb

## 2014-06-14 DIAGNOSIS — E1165 Type 2 diabetes mellitus with hyperglycemia: Secondary | ICD-10-CM

## 2014-06-14 DIAGNOSIS — IMO0002 Reserved for concepts with insufficient information to code with codable children: Secondary | ICD-10-CM

## 2014-06-14 MED ORDER — METFORMIN HCL ER 500 MG PO TB24: 1000.0000 mg | ORAL_TABLET | Freq: Two times a day (BID) | ORAL | Status: DC

## 2014-06-14 MED ORDER — INSULIN GLARGINE 100 UNIT/ML SOLOSTAR PEN
12.0000 [IU] | PEN_INJECTOR | Freq: Every day | SUBCUTANEOUS | Status: DC
Start: 2014-06-14 — End: 2015-05-12

## 2014-06-14 NOTE — Patient Instructions (Signed)
Please decrease Lantus to 12 units at bedtime.  Please continue: - Metformin XR 1000 mg 2x a day - Januvia 100 mg daily in am.  Please return in 3 month with your sugar log.

## 2014-06-14 NOTE — Progress Notes (Signed)
Patient ID: [name], female   DOB: 18-Mar-1945, 69 y.o.   MRN: [id number]  HPI: [name] is a 69 y.o.-year-old female, returning for f/u for DM2, dx 1995, insulin-dependent since 2014, uncontrolled, without complications. Last visit 3 mo ago.   Last hemoglobin A1c was: Lab Results  Component Value Date   HGBA1C 7.0* 05/06/2014   HGBA1C 7.2* 03/15/2014   HGBA1C 6.7* 12/13/2013   Pt is on a regimen of: - Metformin XR 500 mg >> 1000 mg in am and 500 mg in pm - Lantus 22 >> 18 units in am >> hs - Januvia 100 mg daily  Pt checks her sugars 2 a day and they are (per log): - am: 87-149 >> 86-117 >> 89-130, 150 >> 83-107, 139 - 2h after b'fast: 112-139 >> 117-145 >> 120-146 >> 93-164 - before lunch: 77-150 >> 113-137 >> 70-120 >> 74, 88 - 2h after lunch: 65-188 >> 96-163 >> 138-158, 184 >> 130 - before dinner: 95-143 >> 97-103 >> 86-120 >> 82, 86 - 2h after dinner: 118 >> 131-174 >> 88x1, 120-188 >> 96 - bedtime: 95-161 (203) >> 117-146, 270x1 >> n/c >> 115-154 - nighttime: n/c No lows. Lowest sugar was 70x1 >> 83; she has hypoglycemia awareness at 87.  Highest sugar was 270 >> 188 >> 164  Pt's meals are: - Breakfast: cereals + 2% milk; bread + cheese; bread + PB - Lunch: sandwich; soup - Dinner: chicken; steak; seafood - Snacks: 2: PB crackers  She started an exercise class since last visit.  - no CKD, last BUN/creatinine:  Lab Results  Component Value Date   BUN 17 05/06/2014   CREATININE 0.94 05/06/2014   - last set of lipids: Lab Results  Component Value Date   CHOL 134 05/06/2014   HDL 44.20 05/06/2014   LDLCALC 80 05/06/2014   LDLDIRECT 163.1 07/04/2011   TRIG 51.0 05/06/2014   CHOLHDL 3 05/06/2014  On Red yeast rice and Lipitor.  - last eye exam was in 05/13/2014. No DR.  - no numbness and tingling in her feet.  I reviewed pt's medications, allergies, PMH, social hx, family hx, and changes were documented in the history of present illness. Otherwise,  unchanged from my initial visit note.  ROS: Constitutional: no weight gain/loss, + fatigue, no subjective hyperthermia/hypothermia, no excessive urination  Eyes: no blurry vision, no xerophthalmia ENT: no sore throat, no nodules palpated in throat, no dysphagia/odynophagia, no hoarseness Cardiovascular: no CP/SOB/palpitations/no leg swelling Respiratory: no cough/SOB Gastrointestinal: no N/V/D/C Musculoskeletal: no muscle/joint aches Skin: no rashes Neurological: no tremors/numbness/tingling/dizziness  PE: BP 104/58 mmHg  Pulse 71  Temp(Src) 98.2 F (36.8 C) (Oral)  Resp 12  Wt 198 lb (89.812 kg)  SpO2 96% Wt Readings from Last 3 Encounters:  06/14/14 198 lb (89.812 kg)  05/13/14 199 lb 8 oz (90.493 kg)  04/11/14 198 lb 4 oz (89.926 kg)   Constitutional: overweight, in NAD Eyes: PERRLA, EOMI, no exophthalmos ENT: moist mucous membranes, no thyromegaly, no cervical lymphadenopathy Cardiovascular: RRR, No MRG Respiratory: CTA B Gastrointestinal: abdomen soft, NT, ND, BS+ Musculoskeletal: no deformities, strength intact in all 4 Skin: moist, warm, no rashes Neurological: no tremor with outstretched hands, DTR normal in all 4  ASSESSMENT: 1. DM2, insulin-dependent, uncontrolled, without complications  PLAN:  1. Patient with long-standing,suboptimally controlled diabetes, now with excellent improvement in control on basal insulin and oral antidiabetic regimen >> to the point of lows in am and sometimes later. Will need to reduce the  basal insulin. - I suggested to:  Patient Instructions  Please decrease Lantus to 12 units at bedtime.  Please continue: - Metformin XR 1000 mg 2x a day - Januvia 100 mg daily in am.  Please return in 3 month with your sugar log.  - reviewed together last hbA1c 1.5 mo ago: 7.0% - continue checking sugars at different times of the day - check 2 times a day, rotating checks - advised for yearly eye exams, she is up to date - refilled  Metformin and Lantus - Return to clinic in 3 mo with sugar log

## 2014-06-20 ENCOUNTER — Telehealth: Payer: Self-pay | Admitting: Family Medicine

## 2014-06-20 ENCOUNTER — Other Ambulatory Visit: Payer: Self-pay | Admitting: Family Medicine

## 2014-06-20 DIAGNOSIS — E2839 Other primary ovarian failure: Secondary | ICD-10-CM

## 2014-06-20 NOTE — Telephone Encounter (Signed)
[facility] and asked [facility] to sign off on the Bone Density order change of diagnosis to Estrogen Deficiency. [facility] , all you need to do is sign the Co-sign thing that they send you.

## 2014-06-21 ENCOUNTER — Ambulatory Visit
Admission: RE | Admit: 2014-06-21 | Discharge: 2014-06-21 | Disposition: A | Discharge status: Home / Self Care | Payer: BLUE CROSS/BLUE SHIELD | Source: Ambulatory Visit | Attending: Family Medicine | Admitting: Family Medicine

## 2014-06-21 DIAGNOSIS — E2839 Other primary ovarian failure: Secondary | ICD-10-CM

## 2014-06-21 DIAGNOSIS — Z1231 Encounter for screening mammogram for malignant neoplasm of breast: Secondary | ICD-10-CM

## 2014-06-21 NOTE — Telephone Encounter (Signed)
Will do once they send it.

## 2014-07-28 ENCOUNTER — Other Ambulatory Visit: Payer: Self-pay | Admitting: Family Medicine

## 2014-08-11 ENCOUNTER — Telehealth: Payer: Self-pay | Admitting: Family Medicine

## 2014-08-11 NOTE — Telephone Encounter (Signed)
No labs needed

## 2014-08-11 NOTE — Telephone Encounter (Signed)
Pt came in today for labs, but she wasn't on the schedule and there were no orders. She said she wrote it down, but could have been mistaken and was questioning why she would have needed labs at this time. She stated her diabetes doctor orders labs for her in that regard. Please let us know if she does need to come back to have labs drawn.  She has her cpe scheduled for 05/2015 with labs prior.

## 2014-08-11 NOTE — Telephone Encounter (Signed)
Ms. Roppolo notified by telephone that no labs were needed.

## 2014-08-15 ENCOUNTER — Encounter: Payer: Self-pay | Admitting: Internal Medicine

## 2014-08-15 ENCOUNTER — Ambulatory Visit (INDEPENDENT_AMBULATORY_CARE_PROVIDER_SITE_OTHER): Payer: BLUE CROSS/BLUE SHIELD | Admitting: Internal Medicine

## 2014-08-15 VITALS — BP 132/68 | HR 73 | Temp 98.3°F | Wt 200.0 lb

## 2014-08-15 DIAGNOSIS — L0231 Cutaneous abscess of buttock: Secondary | ICD-10-CM

## 2014-08-15 MED ORDER — CEPHALEXIN 500 MG PO CAPS: 500.0000 mg | ORAL_CAPSULE | Freq: Three times a day (TID) | ORAL | Status: DC

## 2014-08-15 NOTE — Progress Notes (Signed)
Pre visit review using our clinic review tool, if applicable. No additional management support is needed unless otherwise documented below in the visit note. 

## 2014-08-15 NOTE — Patient Instructions (Signed)

## 2014-08-15 NOTE — Progress Notes (Signed)
Subjective:    Patient ID: [name], female    DOB: 17-May-1945, 69 y.o.   MRN: [id number]  HPI  Pt presents to the clinic today with c/o a lump on her left buttock. She noticed this 2 weeks ago. She reports it hurts mostly when she is sitting down. She has not noticed any drainage from the area. She has tried warm compress and hot baths without any relief. She denies fever, chills or body aches. She has not had anything like this before.  Review of Systems      Past Medical History  Diagnosis Date  . Disorders of bursae and tendons in shoulder region, unspecified   . Headache(784.0)   . Other urinary incontinence   . Diverticulosis of colon (without mention of hemorrhage)   . Internal hemorrhoids without mention of complication   . Obesity, unspecified   . Depressive disorder, not elsewhere classified   . Diaphragmatic hernia without mention of obstruction or gangrene   . Pure hypercholesterolemia   . Diabetes mellitus without mention of complication   . Paroxysmal atrial fibrillation     Current Outpatient Prescriptions  Medication Sig Dispense Refill  . apixaban (ELIQUIS) 5 MG TABS tablet Take 1 tablet (5 mg total) by mouth 2 (two) times daily. 60 tablet 9  . atorvastatin (LIPITOR) 10 MG tablet TAKE ONE TABLET BY MOUTH EVERY NIGHT AT BEDTIME 30 tablet 11  . calcium-vitamin D (OSCAL WITH D) 500-200 MG-UNIT per tablet Take 1 tablet by mouth daily.      [name] escitalopram (LEXAPRO) 10 MG tablet TAKE ONE TABLET BY MOUTH ONCE DAILY 30 tablet 5  . Insulin Glargine (LANTUS) 100 UNIT/ML Solostar Pen Inject 12 Units into the skin daily at 10 pm. 15 mL 2  . Insulin Pen Needle (B-D ULTRAFINE III SHORT PEN) 31G X 8 MM MISC Use as directed Dx:250.00 100 each 6  . JANUVIA 100 MG tablet TAKE 1 TABLET BY MOUTH DAILY 30 tablet 2  . metFORMIN (GLUCOPHAGE-XR) 500 MG 24 hr tablet Take 2 tablets (1,000 mg total) by mouth 2 (two) times daily. 360 tablet 1  . metoprolol tartrate (LOPRESSOR) 25 MG  tablet Take 0.5 tablets (12.5 mg total) by mouth 2 (two) times daily. 60 tablet 6  . Multiple Vitamin (MULTIVITAMIN) tablet Take 1 tablet by mouth daily.      . ONE TOUCH ULTRA TEST test strip CHECK BLOOD SUGAR THREE TIMES A DAY AS DIRECTED 100 each 11  . ranitidine (ZANTAC) 150 MG tablet Take 150 mg by mouth daily.       No current facility-administered medications for this visit.    Allergies  Allergen Reactions  . Cortisone   . Pravastatin Sodium     REACTION: leg pain    Family History  Problem Relation Age of Onset  . Cancer Mother     fullopian tube  . Dementia Mother   . Pulmonary embolism Mother   . Hyperlipidemia Mother   . Cancer Father     lung  . Hyperlipidemia Father   . Hypertension Sister   . Diabetes Brother   . Stroke Paternal Grandmother   . Stroke Paternal Grandfather     History   Social History  . Marital Status: Married    Spouse Name: N/A  . Number of Children: 2  . 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 Smoking status: Never Smoker   .  Smokeless tobacco: Never Used  . Alcohol Use: No  . Drug Use: No  . Sexual Activity: Not on file   Other Topics Concern  . Not on file   Social History Narrative   Exercise walks 3 times daily         Diet fruit, occ fast food, salads and lean meat     Constitutional: Denies fever, malaise, fatigue, headache or abrupt weight changes.  Respiratory: Denies difficulty breathing, shortness of breath, cough or sputum production.   Cardiovascular: Denies chest pain, chest tightness, palpitations or swelling in the hands or feet.  Skin: Pt reports lump on left buttock. Denies redness, rashes, lesions or ulcercations.   No other specific complaints in a complete review of systems (except as listed in HPI above).  Objective:   Physical Exam   BP 132/68 mmHg  Pulse 73  Temp(Src) 98.3 F (36.8 C) (Oral)  Wt 200 lb (90.719 kg)  SpO2  98% Wt Readings from Last 3 Encounters:  08/15/14 200 lb (90.719 kg)  06/14/14 198 lb (89.812 kg)  05/13/14 199 lb 8 oz (90.493 kg)    General: Appears her stated age, obese in NAD. Skin: 2 cm round abscess noted on left medial gluteal fold. Tender to touch. Fluctuant. Cardiovascular: Normal rate and rhythm. S1,S2 noted.  No murmur, rubs or gallops noted. No JVD or BLE edema. No carotid bruits noted. Pulmonary/Chest: Normal effort and positive vesicular breath sounds. No respiratory distress. No wheezes, rales or ronchi noted.    BMET    Component Value Date/Time   NA 138 05/06/2014 0834   K 4.5 05/06/2014 0834   CL 105 05/06/2014 0834   CO2 30 05/06/2014 0834   GLUCOSE 98 05/06/2014 0834   BUN 17 05/06/2014 0834   CREATININE 0.94 05/06/2014 0834   CREATININE 0.94 09/24/2013 1610   CREATININE 0.8 07/10/2010   CALCIUM 9.2 05/06/2014 0834   GFRNONAA 65* 07/02/2011 2305   GFRAA 76* 07/02/2011 2305    Lipid Panel     Component Value Date/Time   CHOL 134 05/06/2014 0834   TRIG 51.0 05/06/2014 0834   HDL 44.20 05/06/2014 0834   CHOLHDL 3 05/06/2014 0834   VLDL 10.2 05/06/2014 0834   LDLCALC 80 05/06/2014 0834    CBC    Component Value Date/Time   WBC 11.1* 09/24/2013 1610   RBC 4.34 09/24/2013 1610   HGB 12.4 09/24/2013 1610   HCT 35.8* 09/24/2013 1610   PLT 297 09/24/2013 1610   MCV 82.5 09/24/2013 1610   MCH 28.6 09/24/2013 1610   MCHC 34.6 09/24/2013 1610   RDW 14.0 09/24/2013 1610   LYMPHSABS 2.2 09/24/2013 1610   MONOABS 0.9 09/24/2013 1610   EOSABS 0.1 09/24/2013 1610   BASOSABS 0.0 09/24/2013 1610    Hgb A1C Lab Results  Component Value Date   HGBA1C 7.0* 05/06/2014        Assessment & Plan:   Abscess left buttock:  I&D performed, see procedure note eRx for Keflex TID x 10 days Return precautions given Tylenol as needed for pain Continue warm compresses  Procedure note:  Discussed risk versus benefits of procedure including infection,  bleeding and sepsis She understands the risk and verbally agrees to procedure Area cleansed with betadine x 3  Area numbed with approx 3.5 cc Lidocaine with Epi Abscess incised with #11 blade Expressed small amount of pus and blood Area washed with sterile water Covered with TAB and bandaid Pt tolerated procedure well  RTC as needed  or if symptoms persist or worsen

## 2014-09-13 ENCOUNTER — Other Ambulatory Visit (INDEPENDENT_AMBULATORY_CARE_PROVIDER_SITE_OTHER): Payer: BLUE CROSS/BLUE SHIELD | Admitting: *Deleted

## 2014-09-13 ENCOUNTER — Ambulatory Visit: Payer: PPO | Admitting: Internal Medicine

## 2014-09-13 ENCOUNTER — Ambulatory Visit (INDEPENDENT_AMBULATORY_CARE_PROVIDER_SITE_OTHER): Payer: BLUE CROSS/BLUE SHIELD | Admitting: Internal Medicine

## 2014-09-13 ENCOUNTER — Encounter: Payer: Self-pay | Admitting: Internal Medicine

## 2014-09-13 VITALS — BP 112/62 | HR 61 | Temp 98.5°F | Resp 12 | Wt 197.0 lb

## 2014-09-13 DIAGNOSIS — E1165 Type 2 diabetes mellitus with hyperglycemia: Secondary | ICD-10-CM

## 2014-09-13 DIAGNOSIS — IMO0002 Reserved for concepts with insufficient information to code with codable children: Secondary | ICD-10-CM

## 2014-09-13 LAB — POCT GLYCOSYLATED HEMOGLOBIN (HGB A1C): Hemoglobin A1C: 6.6

## 2014-09-13 MED ORDER — SITAGLIPTIN PHOSPHATE 100 MG PO TABS: 100.0000 mg | ORAL_TABLET | Freq: Every day | ORAL | Status: DC

## 2014-09-13 NOTE — Progress Notes (Signed)
Patient ID: [name], female   DOB: [birth year], 69 y.o.   MRN: [id number]  HPI: [name] is a 69 y.o.-year-old female, returning for f/u for DM2, dx 1995, insulin-dependent since 2014, uncontrolled, without complications. Last visit 3 mo ago.   Last hemoglobin A1c was: Lab Results  Component Value Date   HGBA1C 7.0* 05/06/2014   HGBA1C 7.2* 03/15/2014   HGBA1C 6.7* 12/13/2013   Pt is on a regimen of: - Metformin XR 1000 mg in am and 1000 mg in pm - Lantus 22 >> 18 units in am >> 12 units in hs - Januvia 100 mg daily  Pt checks her sugars 2 a day and they are (per log): - am: 87-149 >> 86-117 >> 89-130, 150 >> 83-107, 139 >> 81-139, 144 - 2h after b'fast: 112-139 >> 117-145 >> 120-146 >> 93-164 >> 93-164 - before lunch: 77-150 >> 113-137 >> 70-120 >> 74, 88 >> n/c - 2h after lunch: 65-188 >> 96-163 >> 138-158, 184 >> 130 >> 90-116 - before dinner: 95-143 >> 97-103 >> 86-120 >> 82, 86>>  82-91 - 2h after dinner: 118 >> 131-174 >> 88x1, 120-188 >> 96-172 - bedtime: 95-161 (203) >> 117-146, 270x1 >> n/c >> 115-154 >> 120-144 - nighttime: n/c No lows. Lowest sugar was 70x1 >> 83; she has hypoglycemia awareness at 87.  Highest sugar was 270 >> 188 >> 164  Pt's meals are: - Breakfast: cereals + 2% milk; bread + cheese; bread + PB - Lunch: sandwich; soup - Dinner: chicken; steak; seafood - Snacks: 2: PB crackers  She started to walk!  - no CKD, last BUN/creatinine:  Lab Results  Component Value Date   BUN 17 05/06/2014   CREATININE 0.94 05/06/2014   - last set of lipids: Lab Results  Component Value Date   CHOL 134 05/06/2014   HDL 44.20 05/06/2014   LDLCALC 80 05/06/2014   LDLDIRECT 163.1 07/04/2011   TRIG 51.0 05/06/2014   CHOLHDL 3 05/06/2014  On Red yeast rice and Lipitor.  - last eye exam was in 05/13/2014. No DR.  - no numbness and tingling in her feet.  I reviewed pt's medications, allergies, PMH, social hx, family hx, and changes were documented in the  history of present illness. Otherwise, unchanged from my initial visit note.  ROS: Constitutional: no weight gain/loss, + fatigue, no subjective hyperthermia/hypothermia, + nocturia Eyes: no blurry vision, no xerophthalmia ENT: no sore throat, no nodules palpated in throat, no dysphagia/odynophagia, no hoarseness Cardiovascular: no CP/SOB/palpitations/no leg swelling Respiratory: no cough/SOB Gastrointestinal: + N/no V/D/C Musculoskeletal: no muscle/joint aches Skin: no rashes Neurological: no tremors/numbness/tingling/dizziness  PE: BP 112/62 mmHg  Pulse 61  Temp(Src) 98.5 F (36.9 C) (Oral)  Resp 12  Wt 197 lb (89.359 kg)  SpO2 97% Body mass index is 32.78 kg/(m^2). Wt Readings from Last 3 Encounters:  09/13/14 197 lb (89.359 kg)  08/15/14 200 lb (90.719 kg)  06/14/14 198 lb (89.812 kg)   Constitutional: overweight, in NAD Eyes: PERRLA, EOMI, no exophthalmos ENT: moist mucous membranes, no thyromegaly, no cervical lymphadenopathy Cardiovascular: RRR, No MRG Respiratory: CTA B Gastrointestinal: abdomen soft, NT, ND, BS+ Musculoskeletal: no deformities, strength intact in all 4 Skin: moist, warm, no rashes Neurological: no tremor with outstretched hands, DTR normal in all 4  ASSESSMENT: 1. DM2, insulin-dependent, uncontrolled, without complications  PLAN:  1. Patient with long-standing, suboptimally controlled diabetes, now with excellent improvement in control on basal insulin and oral antidiabetic regimen >> sugars mostly at goal  despite reducing Lantus at last visit. - I suggested to:  Patient Instructions  Please continue: - Lantus 12 units at bedtime - Januvia 100 mg in am - Metformin XR 1000 mg 2x a day  Please return in 3 months with your sugar log.   - reviewed together last HbA1c 1.5 mo ago: 7.0%; rechecked today >> 6.6%  - continue checking sugars at different times of the day - check 2 times a day, rotating checks - advised for yearly eye exams, she is  up to date - refilled Januvia - Return to clinic in 3 mo with sugar log

## 2014-09-13 NOTE — Patient Instructions (Signed)
Please continue: - Lantus 12 units at bedtime - Januvia 100 mg in am - Metformin XR 1000 mg 2x a day  Please return in 3 months with your sugar log.

## 2014-09-14 ENCOUNTER — Ambulatory Visit (INDEPENDENT_AMBULATORY_CARE_PROVIDER_SITE_OTHER): Payer: BLUE CROSS/BLUE SHIELD | Admitting: Internal Medicine

## 2014-09-14 ENCOUNTER — Encounter: Payer: Self-pay | Admitting: Internal Medicine

## 2014-09-14 VITALS — BP 114/62 | HR 69 | Temp 99.0°F | Wt 197.2 lb

## 2014-09-14 DIAGNOSIS — L0231 Cutaneous abscess of buttock: Secondary | ICD-10-CM | POA: Diagnosis not present

## 2014-09-14 MED ORDER — SULFAMETHOXAZOLE-TRIMETHOPRIM 800-160 MG PO TABS: 1.0000 | ORAL_TABLET | Freq: Two times a day (BID) | ORAL | Status: DC

## 2014-09-14 NOTE — Progress Notes (Signed)
   Subjective:    Patient ID: [name], female    DOB: Oct 20, 1945, 69 y.o.   MRN: [id number]  HPI    Review of Systems     Objective:   Physical Exam        Assessment & Plan:  Pre visit review using our clinic review tool, if applicable. No additional management support is needed unless otherwise documented below in the visit note.

## 2014-09-14 NOTE — Patient Instructions (Signed)

## 2014-09-14 NOTE — Progress Notes (Signed)
Subjective:    Patient ID: [name], female    DOB: [birth year], 69 y.o.   MRN: [id number]  HPI  Pt presents to the clinic today to follow up abscess on left buttock. She was seen 1 month ago for the same. The area was I&D'd. She was on Keflex x 10 days. She reports the boil got better but never completely went away. She reports 2-3 days ago, the area became painful again. She has noticed some bloody drainage from the area. She denies fever, chills or body aches. She has cleaned the area with Peroxide.  Review of Systems  Past Medical History  Diagnosis Date  . Disorders of bursae and tendons in shoulder region, unspecified   . Headache(784.0)   . Other urinary incontinence   . Diverticulosis of colon (without mention of hemorrhage)   . Internal hemorrhoids without mention of complication   . Obesity, unspecified   . Depressive disorder, not elsewhere classified   . Diaphragmatic hernia without mention of obstruction or gangrene   . Pure hypercholesterolemia   . Diabetes mellitus without mention of complication   . Paroxysmal atrial fibrillation     Current Outpatient Prescriptions  Medication Sig Dispense Refill  . apixaban (ELIQUIS) 5 MG TABS tablet Take 1 tablet (5 mg total) by mouth 2 (two) times daily. 60 tablet 9  . atorvastatin (LIPITOR) 10 MG tablet TAKE ONE TABLET BY MOUTH EVERY NIGHT AT BEDTIME 30 tablet 11  . calcium-vitamin D (OSCAL WITH D) 500-200 MG-UNIT per tablet Take 1 tablet by mouth daily.      [name] escitalopram (LEXAPRO) 10 MG tablet TAKE ONE TABLET BY MOUTH ONCE DAILY 30 tablet 5  . Insulin Glargine (LANTUS) 100 UNIT/ML Solostar Pen Inject 12 Units into the skin daily at 10 pm. 15 mL 2  . Insulin Pen Needle (B-D ULTRAFINE III SHORT PEN) 31G X 8 MM MISC Use as directed Dx:250.00 100 each 6  . metFORMIN (GLUCOPHAGE-XR) 500 MG 24 hr tablet Take 2 tablets (1,000 mg total) by mouth 2 (two) times daily. 360 tablet 1  . metoprolol tartrate (LOPRESSOR) 25 MG tablet  Take 0.5 tablets (12.5 mg total) by mouth 2 (two) times daily. 60 tablet 6  . Multiple Vitamin (MULTIVITAMIN) tablet Take 1 tablet by mouth daily.      . ONE TOUCH ULTRA TEST test strip CHECK BLOOD SUGAR THREE TIMES A DAY AS DIRECTED 100 each 11  . ranitidine (ZANTAC) 150 MG tablet Take 150 mg by mouth daily.      . sitaGLIPtin (JANUVIA) 100 MG tablet Take 1 tablet (100 mg total) by mouth daily. 90 tablet 3  . cephALEXin (KEFLEX) 500 MG capsule Take 1 capsule (500 mg total) by mouth 3 (three) times daily. (Patient not taking: Reported on 09/14/2014) 30 capsule 0   No current facility-administered medications for this visit.    Allergies  Allergen Reactions  . Cortisone   . Pravastatin Sodium     REACTION: leg pain    Family History  Problem Relation Age of Onset  . Cancer Mother     fullopian tube  . Dementia Mother   . Pulmonary embolism Mother   . Hyperlipidemia Mother   . Cancer Father     lung  . Hyperlipidemia Father   . Hypertension Sister   . Diabetes Brother   . Stroke Paternal Grandmother   . Stroke Paternal Grandfather     History   Social History  . Marital Status: Married  Spouse Name: N/A  . Number of Children: 2  . 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 Smoking status: Never Smoker   . Smokeless tobacco: Never Used  . Alcohol Use: No  . Drug Use: No  . Sexual Activity: Not on file   Other Topics Concern  . Not on file   Social History Narrative   Exercise walks 3 times daily         Diet fruit, occ fast food, salads and lean meat     Constitutional: Denies fever, malaise, fatigue, headache or abrupt weight changes.  Respiratory: Denies difficulty breathing, shortness of breath, cough or sputum production.   Cardiovascular: Denies chest pain, chest tightness, palpitations or swelling in the hands or feet.  Skin: Pt reports abscess on left buttock. Denies redness, rashes, or  ulcercations.    No other specific complaints in a complete review of systems (except as listed in HPI above).     Objective:   Physical Exam   BP 114/62 mmHg  Pulse 69  Temp(Src) 99 F (37.2 C) (Oral)  Wt 197 lb 4 oz (89.472 kg)  SpO2 97%  LMP  (LMP Unknown) Wt Readings from Last 3 Encounters:  09/14/14 197 lb 4 oz (89.472 kg)  09/13/14 197 lb (89.359 kg)  08/15/14 200 lb (90.719 kg)    General: Appears her stated age, obese in NAD. Skin: Warm, dry and intact. 1.5 cm round abscess noted on left gluteal fold. No cellulitis or drainage noted. Not fluctuant. Cardiovascular: Normal rate and rhythm. S1,S2 noted.  No murmur, rubs or gallops noted.  Pulmonary/Chest: Normal effort and positive vesicular breath sounds. No respiratory distress. No wheezes, rales or ronchi noted.  Neurological: Alert and oriented.   BMET    Component Value Date/Time   NA 138 05/06/2014 0834   K 4.5 05/06/2014 0834   CL 105 05/06/2014 0834   CO2 30 05/06/2014 0834   GLUCOSE 98 05/06/2014 0834   BUN 17 05/06/2014 0834   CREATININE 0.94 05/06/2014 0834   CREATININE 0.94 09/24/2013 1610   CREATININE 0.8 07/10/2010   CALCIUM 9.2 05/06/2014 0834   GFRNONAA 65* 07/02/2011 2305   GFRAA 76* 07/02/2011 2305    Lipid Panel     Component Value Date/Time   CHOL 134 05/06/2014 0834   TRIG 51.0 05/06/2014 0834   HDL 44.20 05/06/2014 0834   CHOLHDL 3 05/06/2014 0834   VLDL 10.2 05/06/2014 0834   LDLCALC 80 05/06/2014 0834    CBC    Component Value Date/Time   WBC 11.1* 09/24/2013 1610   RBC 4.34 09/24/2013 1610   HGB 12.4 09/24/2013 1610   HCT 35.8* 09/24/2013 1610   PLT 297 09/24/2013 1610   MCV 82.5 09/24/2013 1610   MCH 28.6 09/24/2013 1610   MCHC 34.6 09/24/2013 1610   RDW 14.0 09/24/2013 1610   LYMPHSABS 2.2 09/24/2013 1610   MONOABS 0.9 09/24/2013 1610   EOSABS 0.1 09/24/2013 1610   BASOSABS 0.0 09/24/2013 1610    Hgb A1C Lab Results  Component Value Date   HGBA1C 6.6  09/13/2014        Assessment & Plan:   Abscess of left buttock:  Not fluctuant- unable to drain eRx for Septra BID x 10 days  If does not resolve, will have her follow up with her PCP or derm Watch for fever, chills, increased pain or drainage  RTC as needed  or if symptoms persist or worsen

## 2014-10-17 ENCOUNTER — Ambulatory Visit (INDEPENDENT_AMBULATORY_CARE_PROVIDER_SITE_OTHER): Payer: BLUE CROSS/BLUE SHIELD | Admitting: Family Medicine

## 2014-10-17 ENCOUNTER — Encounter: Payer: Self-pay | Admitting: Family Medicine

## 2014-10-17 DIAGNOSIS — N390 Urinary tract infection, site not specified: Secondary | ICD-10-CM | POA: Insufficient documentation

## 2014-10-17 DIAGNOSIS — N3001 Acute cystitis with hematuria: Secondary | ICD-10-CM

## 2014-10-17 LAB — POCT URINALYSIS DIPSTICK
Bilirubin, UA: NEGATIVE
GLUCOSE UA: NEGATIVE
Ketones, UA: NEGATIVE
NITRITE UA: POSITIVE
Protein, UA: 30
Spec Grav, UA: 1.025
UROBILINOGEN UA: 0.2
pH, UA: 5.5

## 2014-10-17 MED ORDER — CEPHALEXIN 500 MG PO CAPS: 500.0000 mg | ORAL_CAPSULE | Freq: Two times a day (BID) | ORAL | Status: DC

## 2014-10-17 NOTE — Assessment & Plan Note (Signed)
New acute problem. Urinalysis consistent with UTI. Treating with Keflex 500 mg twice a day 7 days. Rx given today.

## 2014-10-17 NOTE — Progress Notes (Signed)
Pre visit review using our clinic review tool, if applicable. No additional management support is needed unless otherwise documented below in the visit note. 

## 2014-10-17 NOTE — Patient Instructions (Signed)
It was nice to see you today.  Take the antibiotic twice daily x 1 week.  Follow up as needed.  Take care  Dr. Lacinda Axon

## 2014-10-17 NOTE — Progress Notes (Signed)
   Subjective:  Patient ID: [name], female    DOB: 06/14/1945  Age: 69 y.o. MRN: [id number]  CC: ? UTI  HPI 69 year old female with a PMH of DM-2 presents to the clinic today with complaints of dysuria.  1) Dysuria  Patient reports that she acutely developed dysuria, urinary urgency and frequency earlier today.  She reports that the pain she is managing his moderate to severe.  She also reports associated back pain.  No reported fevers or chills. No nausea or vomiting.  No exacerbating or relieving factors.  No interventions tried.  Patient is concerned that she has urinary infection.   Social Hx - Nonsmoker.  Review of Systems  Constitutional: Negative for fever and chills.  Gastrointestinal: Negative for nausea and vomiting.  Genitourinary: Positive for dysuria, urgency and frequency.    Objective:  BP 108/60 mmHg  Pulse 80  Temp(Src) 98.4 F (36.9 C) (Oral)  Ht 5' 5.5" (1.664 m)  Wt 194 lb 4 oz (88.111 kg)  BMI 31.82 kg/m2  SpO2 97%  LMP  (LMP Unknown)  BP/Weight 10/17/2014 09/14/2014 9/51/8841  Systolic BP 660 630 160  Diastolic BP 60 62 62  Wt. (Lbs) 194.25 197.25 197  BMI 31.82 32.82 32.78   Physical Exam  Constitutional: She is oriented to person, place, and time. She appears well-developed and well-nourished. No distress.  Cardiovascular: Normal rate and regular rhythm.   No murmur heard. Pulmonary/Chest: Effort normal and breath sounds normal. No respiratory distress. She has no wheezes. She has no rales.  Abdominal: Soft.  Tender to palpation in the suprapubic region. No rebound or guarding. No palpable organomegaly.  Musculoskeletal: She exhibits no edema.  Neurological: She is alert and oriented to person, place, and time.  Psychiatric: She has a normal mood and affect.    Assessment & Plan:   Problem List Items Addressed This Visit    UTI (urinary tract infection)    New acute problem. Urinalysis consistent with UTI. Treating with  Keflex 500 mg twice a day 7 days. Rx given today.      Relevant Medications   cephALEXin (KEFLEX) 500 MG capsule   Other Relevant Orders   POCT urinalysis dipstick (Completed)      Meds ordered this encounter  Medications  . cephALEXin (KEFLEX) 500 MG capsule    Sig: Take 1 capsule (500 mg total) by mouth 2 (two) times daily.    Dispense:  14 capsule    Refill:  0    Follow-up: PRN   Thersa Salt, DO

## 2014-12-14 ENCOUNTER — Other Ambulatory Visit (INDEPENDENT_AMBULATORY_CARE_PROVIDER_SITE_OTHER): Payer: BLUE CROSS/BLUE SHIELD | Admitting: *Deleted

## 2014-12-14 ENCOUNTER — Ambulatory Visit (INDEPENDENT_AMBULATORY_CARE_PROVIDER_SITE_OTHER): Payer: BLUE CROSS/BLUE SHIELD | Admitting: Internal Medicine

## 2014-12-14 ENCOUNTER — Encounter: Payer: Self-pay | Admitting: Internal Medicine

## 2014-12-14 VITALS — BP 104/62 | HR 75 | Temp 97.5°F | Resp 12 | Wt 197.6 lb

## 2014-12-14 DIAGNOSIS — E1165 Type 2 diabetes mellitus with hyperglycemia: Secondary | ICD-10-CM | POA: Diagnosis not present

## 2014-12-14 DIAGNOSIS — Z794 Long term (current) use of insulin: Secondary | ICD-10-CM

## 2014-12-14 DIAGNOSIS — Z23 Encounter for immunization: Secondary | ICD-10-CM

## 2014-12-14 LAB — POCT GLYCOSYLATED HEMOGLOBIN (HGB A1C): Hemoglobin A1C: 6.8

## 2014-12-14 NOTE — Progress Notes (Signed)
Patient ID: ISMERAI BIN, female   DOB: 11-03-1945, 69 y.o.   MRN: [id number]  HPI: [name] is a 69 y.o.-year-old female, returning for f/u for DM2, dx 1995, insulin-dependent since 2014, uncontrolled, without complications. Last visit 3 mo ago.   Sugars are higher in last week - cruise - overeating.  Last hemoglobin A1c was: Lab Results  Component Value Date   HGBA1C 6.6 09/13/2014   HGBA1C 7.0* 05/06/2014   HGBA1C 7.2* 03/15/2014   Pt is on a regimen of: - Metformin XR 1000 mg in am and 1000 mg in pm - Lantus 22 >> 18 units in am >> 12 units in hs - Januvia 100 mg daily in am  Pt checks her sugars 2 a day and they are (per meter download): - am: 87-149 >> 86-117 >> 89-130, 150 >> 83-107, 139 >> 81-139, 144 >> 93-145, 155, 166 (cruise) - 2h after b'fast: 112-139 >> 117-145 >> 120-146 >> 93-164 >> 93-164 >> 110-131, 308 x1 - before lunch: 77-150 >> 113-137 >> 70-120 >> 74, 88 >> n/c >> 120, 147 - 2h after lunch: 65-188 >> 96-163 >> 138-158, 184 >> 130 >> 90-116 >> 116, 139 - before dinner: 95-143 >> 97-103 >> 86-120 >> 82, 86>>  82-91 >> 211, 209 - 2h after dinner: 118 >> 131-174 >> 88x1, 120-188 >> 96-172 >> 124 - bedtime: 95-161 (203) >> 117-146, 270x1 >> n/c >> 115-154 >> 120-144 >> 114, 228 - nighttime: n/c No lows. Lowest sugar was 70x1 >> 83; she has hypoglycemia awareness at 87.  Highest sugar was 270 >> 188 >> 164  Pt's meals are: - Breakfast: cereals + 2% milk; bread + cheese; bread + PB - Lunch: sandwich; soup - Dinner: chicken; steak; seafood - Snacks: 2: PB crackers  She continues to walk.  - no CKD, last BUN/creatinine:  Lab Results  Component Value Date   BUN 17 05/06/2014   CREATININE 0.94 05/06/2014   - last set of lipids: Lab Results  Component Value Date   CHOL 134 05/06/2014   HDL 44.20 05/06/2014   LDLCALC 80 05/06/2014   LDLDIRECT 163.1 07/04/2011   TRIG 51.0 05/06/2014   CHOLHDL 3 05/06/2014  On Red yeast rice and Lipitor.  - last eye  exam was in 05/13/2014. No DR.  - no numbness and tingling in her feet.  I reviewed pt's medications, allergies, PMH, social hx, family hx, and changes were documented in the history of present illness. Otherwise, unchanged from my initial visit note.  ROS: Constitutional: no weight gain/loss, + fatigue, no subjective hyperthermia/hypothermia Eyes: no blurry vision, no xerophthalmia ENT: no sore throat, no nodules palpated in throat, no dysphagia/odynophagia, no hoarseness Cardiovascular: no CP/SOB/palpitations/no leg swelling Respiratory: no cough/SOB Gastrointestinal: no N/V/D/C Musculoskeletal: no muscle/joint aches Skin: no rashes Neurological: no tremors/numbness/tingling/dizziness  PE: BP 104/62 mmHg  Pulse 75  Temp(Src) 97.5 F (36.4 C) (Oral)  Resp 12  Wt 197 lb 9.6 oz (89.631 kg)  SpO2 93%  LMP  (LMP Unknown) Body mass index is 32.37 kg/(m^2). Wt Readings from Last 3 Encounters:  12/14/14 197 lb 9.6 oz (89.631 kg)  10/17/14 194 lb 4 oz (88.111 kg)  09/14/14 197 lb 4 oz (89.472 kg)   Constitutional: overweight, in NAD Eyes: PERRLA, EOMI, no exophthalmos ENT: moist mucous membranes, no thyromegaly, no cervical lymphadenopathy Cardiovascular: RRR, No MRG Respiratory: CTA B Gastrointestinal: abdomen soft, NT, ND, BS+ Musculoskeletal: no deformities, strength intact in all 4 Skin: moist, warm, no rashes  Neurological: no tremor with outstretched hands, DTR normal in all 4  ASSESSMENT: 1. DM2, insulin-dependent, controlled, without complications  PLAN:  1. Patient with long-standing, now controlled diabetes, with excellent improvement in control on basal insulin and oral antidiabetic regimen >> sugars mostly at goal. - I suggested to:  Patient Instructions  Please continue: - Lantus 12 units at bedtime - Januvia 100 mg in am - Metformin XR 1000 mg 2x a day  Please return in 3 months with your sugar log.   - rechecked HbA1c today >> 6.8%  - continue checking  sugars at different times of the day - check 1-2 times a day, rotating checks - advised for yearly eye exams, she is up to date - will give flu shot today - Return to clinic in 4 mo with sugar log

## 2014-12-14 NOTE — Patient Instructions (Signed)
Please continue: - Lantus 12 units at bedtime - Januvia 100 mg in am - Metformin XR 1000 mg 2x a day  Please return in 4 months with your sugar log.

## 2015-01-09 ENCOUNTER — Other Ambulatory Visit: Payer: Self-pay | Admitting: *Deleted

## 2015-01-09 MED ORDER — METFORMIN HCL ER 500 MG PO TB24: 1000.0000 mg | ORAL_TABLET | Freq: Two times a day (BID) | ORAL | Status: DC

## 2015-01-20 ENCOUNTER — Encounter: Payer: Self-pay | Admitting: Cardiovascular Disease

## 2015-01-20 ENCOUNTER — Ambulatory Visit (INDEPENDENT_AMBULATORY_CARE_PROVIDER_SITE_OTHER): Payer: BLUE CROSS/BLUE SHIELD | Admitting: Cardiovascular Disease

## 2015-01-20 VITALS — BP 116/62 | HR 65 | Ht 65.5 in | Wt 197.5 lb

## 2015-01-20 DIAGNOSIS — I340 Nonrheumatic mitral (valve) insufficiency: Secondary | ICD-10-CM

## 2015-01-20 DIAGNOSIS — I4891 Unspecified atrial fibrillation: Secondary | ICD-10-CM

## 2015-01-20 DIAGNOSIS — E7800 Pure hypercholesterolemia, unspecified: Secondary | ICD-10-CM | POA: Diagnosis not present

## 2015-01-20 DIAGNOSIS — I480 Paroxysmal atrial fibrillation: Secondary | ICD-10-CM

## 2015-01-20 MED ORDER — APIXABAN 5 MG PO TABS: 5.0000 mg | ORAL_TABLET | Freq: Two times a day (BID) | ORAL | Status: DC

## 2015-01-20 MED ORDER — METOPROLOL TARTRATE 25 MG PO TABS: 12.5000 mg | ORAL_TABLET | Freq: Two times a day (BID) | ORAL | Status: DC

## 2015-01-20 NOTE — Assessment & Plan Note (Signed)
She is doing very well on small dose metoprolol with no evidence of recurrent arrhythmia. He is tolerating anticoagulation with no reported side effects.

## 2015-01-20 NOTE — Patient Instructions (Signed)
Medication Instructions: Continue same medications.   Labwork: None.   Procedures/Testing: None.   Follow-Up: 1 year follow up with Dr. Arida.   Any Additional Special Instructions Will Be Listed Below (If Applicable).   

## 2015-01-20 NOTE — Assessment & Plan Note (Signed)
This was moderate by echo and due to prolapse of the anterior mitral leaflet. She does not have a murmur by exam. Continued observation. Repeat echocardiogram in 2-3 years.   

## 2015-01-20 NOTE — Progress Notes (Signed)
Primary care physician: Dr. Wyn Quaker  HPI  This is a pleasant 69 year old Caucasian female who is here today for followup visit regarding paroxysmal atrial fibrillation.  She had previous cardiac catheterization in October of 2010 for atypical chest pain. It showed no significant coronary artery disease with mildly elevated left ventricular end-diastolic pressure and normal ejection fraction. She has known history of diabetes and hyperlipidemia. She was seen in July of 2015 for newly diagnosed atrial fibrillation. She converted to sinus rhythm on small dose metoprolol. She was started on anticoagulation with Eliquis. 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 She underwent a pharmacologic nuclear stress test for symptoms of fatigue and shortness of breath. It showed no evidence of ischemia. She has been doing very well with no chest pain, shortness of breath or palpitations.   Allergies  Allergen Reactions  . Cortisone   . Pravastatin Sodium     REACTION: leg pain     Current Outpatient Prescriptions on File Prior to Visit  Medication Sig Dispense Refill  . atorvastatin (LIPITOR) 10 MG tablet TAKE ONE TABLET BY MOUTH EVERY NIGHT AT BEDTIME 30 tablet 11  . Calcium Carb-Ergocalciferol 500-200 MG-UNIT TABS Take by mouth.    . calcium-vitamin D (OSCAL WITH D) 500-200 MG-UNIT per tablet Take 1 tablet by mouth daily.      [name] escitalopram (LEXAPRO) 10 MG tablet TAKE ONE TABLET BY MOUTH ONCE DAILY 30 tablet 5  . Insulin Glargine (LANTUS) 100 UNIT/ML Solostar Pen Inject 12 Units into the skin daily at 10 pm. 15 mL 2  . Insulin Pen Needle (B-D ULTRAFINE III SHORT PEN) 31G X 8 MM MISC Use as directed Dx:250.00 100 each 6  . metFORMIN (GLUCOPHAGE-XR) 500 MG 24 hr tablet Take 2 tablets (1,000 mg total) by mouth 2 (two) times daily. 360 tablet 1  . Multiple Vitamin (MULTIVITAMIN) tablet Take 1  tablet by mouth daily.      . ONE TOUCH ULTRA TEST test strip CHECK BLOOD SUGAR THREE TIMES A DAY AS DIRECTED 100 each 11  . ranitidine (ZANTAC) 150 MG tablet Take 150 mg by mouth daily.      . sitaGLIPtin (JANUVIA) 100 MG tablet Take 1 tablet (100 mg total) by mouth daily. 90 tablet 3  . sulfamethoxazole-trimethoprim (BACTRIM DS,SEPTRA DS) 800-160 MG per tablet Take 1 tablet by mouth 2 (two) times daily. 20 tablet 0   No current facility-administered medications on file prior to visit.     Past Medical History  Diagnosis Date  . Disorders of bursae and tendons in shoulder region, unspecified   . Headache(784.0)   . Other urinary incontinence   . Diverticulosis of colon (without mention of hemorrhage)   . Internal hemorrhoids without mention of complication   . Obesity, unspecified   . Depressive disorder, not elsewhere classified   . Diaphragmatic hernia without mention of obstruction or gangrene   . Pure hypercholesterolemia   . Diabetes mellitus without mention of complication   . Paroxysmal atrial fibrillation [facility])      Past Surgical History  Procedure Laterality Date  . Tonsillectomy    . Cholecystectomy    . Breast surgery      Reduction  . Abdominal hysterectomy  1972    Have both ovaries  . Cardiac catheterization      [facility]     Family History  Problem Relation Age of Onset  . Cancer Mother  fullopian tube  . Dementia Mother   . Pulmonary embolism Mother   . Hyperlipidemia Mother   . Cancer Father     lung  . Hyperlipidemia Father   . Hypertension Sister   . Diabetes Brother   . Stroke Paternal Grandmother   . Stroke Paternal Grandfather      Social History   Social History  . Marital Status: Married    Spouse Name: N/A  . Number of Children: 2  . 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 Smoking status: Never Smoker   . Smokeless tobacco: Never Used  . Alcohol Use: No  .  Drug Use: No  . Sexual Activity: Not on file   Other Topics Concern  . Not on file   Social History Narrative   Exercise walks 3 times daily         Diet fruit, occ fast food, salads and lean meat     ROS A 10 point review of system was performed. It is negative other than that mentioned in the history of present illness.   PHYSICAL EXAM   BP 116/62 mmHg  Pulse 65  Ht 5' 5.5" (1.664 m)  Wt 197 lb 8 oz (89.585 kg)  BMI 32.35 kg/m2  LMP  (LMP Unknown) Constitutional: She is oriented to person, place, and time. She appears well-developed and well-nourished. No distress.  HENT: No nasal discharge.  Head: Normocephalic and atraumatic.  Eyes: Pupils are equal and round. No discharge.  Neck: Normal range of motion. Neck supple. No JVD present. No thyromegaly present.  Cardiovascular: Normal rate, regular rhythm, normal heart sounds. Exam reveals no gallop and no friction rub. No murmur heard.  Pulmonary/Chest: Effort normal and breath sounds normal. No stridor. No respiratory distress. She has no wheezes. She has no rales. She exhibits no tenderness.  Abdominal: Soft. Bowel sounds are normal. She exhibits no distension. There is no tenderness. There is no rebound and no guarding.  Musculoskeletal: Normal range of motion. She exhibits no edema and no tenderness.  Neurological: She is alert and oriented to person, place, and time. Coordination normal.  Skin: Skin is warm and dry. No rash noted. She is not diaphoretic. No erythema. No pallor.  Psychiatric: She has a normal mood and affect. Her behavior is normal. Judgment and thought content normal.     EKG: Sinus  Rhythm with a PAC  WITHIN NORMAL LIMITS    ASSESSMENT AND PLAN

## 2015-01-20 NOTE — Assessment & Plan Note (Signed)
Lab Results  Component Value Date   CHOL 134 05/06/2014   HDL 44.20 05/06/2014   LDLCALC 80 05/06/2014        TRIG 51.0 05/06/2014   CHOLHDL 3 05/06/2014   Continue treatment with atorvastatin with a target LDL of less than 100.

## 2015-02-10 ENCOUNTER — Other Ambulatory Visit: Payer: Self-pay | Admitting: Family Medicine

## 2015-04-17 ENCOUNTER — Ambulatory Visit: Payer: BLUE CROSS/BLUE SHIELD | Admitting: Internal Medicine

## 2015-05-09 ENCOUNTER — Telehealth: Payer: Self-pay | Admitting: Family Medicine

## 2015-05-09 ENCOUNTER — Other Ambulatory Visit: Payer: Self-pay | Admitting: Family Medicine

## 2015-05-09 ENCOUNTER — Other Ambulatory Visit: Payer: PPO

## 2015-05-09 ENCOUNTER — Other Ambulatory Visit (INDEPENDENT_AMBULATORY_CARE_PROVIDER_SITE_OTHER): Payer: 59

## 2015-05-09 DIAGNOSIS — E7800 Pure hypercholesterolemia, unspecified: Secondary | ICD-10-CM

## 2015-05-09 DIAGNOSIS — Z1159 Encounter for screening for other viral diseases: Secondary | ICD-10-CM

## 2015-05-09 DIAGNOSIS — E1165 Type 2 diabetes mellitus with hyperglycemia: Secondary | ICD-10-CM | POA: Diagnosis not present

## 2015-05-09 LAB — LIPID PANEL
CHOL/HDL RATIO: 3
Cholesterol: 145 mg/dL (ref 0–200)
HDL: 44.5 mg/dL (ref >39.00)
LDL Cholesterol: 86 mg/dL (ref 0–99)
NONHDL: 100.76
TRIGLYCERIDES: 73 mg/dL (ref 0.0–149.0)
VLDL: 14.6 mg/dL (ref 0.0–40.0)

## 2015-05-09 LAB — COMPREHENSIVE METABOLIC PANEL
ALT: 15 U/L (ref 0–35)
AST: 16 U/L (ref 0–37)
Albumin: 4.2 g/dL (ref 3.5–5.2)
Alkaline Phosphatase: 49 U/L (ref 39–117)
BUN: 19 mg/dL (ref 6–23)
CHLORIDE: 104 meq/L (ref 96–112)
CO2: 27 mEq/L (ref 19–32)
Calcium: 9.4 mg/dL (ref 8.4–10.5)
Creatinine, Ser: 0.89 mg/dL (ref 0.40–1.20)
GFR: 67.01 mL/min (ref >60.00)
GLUCOSE: 103 mg/dL — AB (ref 70–99)
POTASSIUM: 4.5 meq/L (ref 3.5–5.1)
Sodium: 140 mEq/L (ref 135–145)
TOTAL PROTEIN: 6.7 g/dL (ref 6.0–8.3)
Total Bilirubin: 0.4 mg/dL (ref 0.2–1.2)

## 2015-05-09 LAB — HEMOGLOBIN A1C: Hgb A1c MFr Bld: 7 % — ABNORMAL HIGH (ref 4.6–6.5)

## 2015-05-09 NOTE — Telephone Encounter (Signed)
-----   Message from [name] Sia sent at 05/01/2015 10:57 AM EST ----- Regarding: Lab orders for Tuesdat, 3.7.17  AWV lab orders, please.

## 2015-05-10 LAB — HEPATITIS C ANTIBODY: HCV AB: NEGATIVE

## 2015-05-12 ENCOUNTER — Ambulatory Visit (INDEPENDENT_AMBULATORY_CARE_PROVIDER_SITE_OTHER): Payer: 59 | Admitting: Internal Medicine

## 2015-05-12 ENCOUNTER — Other Ambulatory Visit: Payer: Self-pay | Admitting: *Deleted

## 2015-05-12 ENCOUNTER — Encounter: Payer: Self-pay | Admitting: Internal Medicine

## 2015-05-12 VITALS — BP 110/60 | HR 66 | Temp 98.4°F | Resp 12 | Wt 197.6 lb

## 2015-05-12 DIAGNOSIS — Z794 Long term (current) use of insulin: Secondary | ICD-10-CM

## 2015-05-12 DIAGNOSIS — E1165 Type 2 diabetes mellitus with hyperglycemia: Secondary | ICD-10-CM | POA: Diagnosis not present

## 2015-05-12 DIAGNOSIS — E119 Type 2 diabetes mellitus without complications: Secondary | ICD-10-CM | POA: Insufficient documentation

## 2015-05-12 MED ORDER — SAXAGLIPTIN HCL 5 MG PO TABS: 5.0000 mg | ORAL_TABLET | Freq: Every day | ORAL | Status: DC

## 2015-05-12 MED ORDER — INSULIN GLARGINE 100 UNIT/ML SOLOSTAR PEN: 12.0000 [IU] | PEN_INJECTOR | Freq: Every day | SUBCUTANEOUS | Status: DC

## 2015-05-12 MED ORDER — GLUCOSE BLOOD VI STRP: ORAL_STRIP | Status: DC

## 2015-05-12 MED ORDER — METFORMIN HCL ER 500 MG PO TB24: 2000.0000 mg | ORAL_TABLET | Freq: Every day | ORAL | Status: DC

## 2015-05-12 MED ORDER — SITAGLIPTIN PHOSPHATE 100 MG PO TABS: 100.0000 mg | ORAL_TABLET | Freq: Every day | ORAL | Status: DC

## 2015-05-12 NOTE — Patient Instructions (Addendum)
Please continue: - Lantus 12 units at bedtime - Januvia 100 mg in am  Please move all Metformin ER at dinnertime (2000 mg).  Please return in 3 months with your sugar log.

## 2015-05-12 NOTE — Telephone Encounter (Signed)
Ins would not cover Januvia. Switching to Onglyza 5 mg per Dr Cruzita Lederer.

## 2015-05-12 NOTE — Progress Notes (Signed)
Patient ID: [name], female   DOB: February 13, 1946, 70 y.o.   MRN: [id number]  HPI: [name] is a 70 y.o.-year-old female, returning for f/u for DM2, dx 1995, insulin-dependent since 2014, uncontrolled, without complications. Last visit 5 mo ago.   She has pain in dorsum of both feet >> lack of exercise.  Last hemoglobin A1c was: Lab Results  Component Value Date   HGBA1C 7.0* 05/09/2015   HGBA1C 6.8 12/14/2014   HGBA1C 6.6 09/13/2014   Pt is on a regimen of: - Metformin XR 1000 mg in am and 1000 mg in pm - Lantus 22 >> 18 units in am >> 12 units in hs - Januvia 100 mg daily in am  Pt checks her sugars 2 a day and they are (per meter download): - am: 87-149 >> 86-117 >> 89-130, 150 >> 83-107, 139 >> 81-139, 144 >> 93-145, 155, 166 (cruise) >> 104-147, 155, 162 - 2h after b'fast: 112-139 >> 117-145 >> 120-146 >> 93-164 >> 93-164 >> 110-131, 308 x1 >> 117 - before lunch: 77-150 >> 113-137 >> 70-120 >> 74, 88 >> n/c >> 120, 147 >> n/c  - 2h after lunch: 65-188 >> 96-163 >> 138-158, 184 >> 130 >> 90-116 >> 116, 139 >> 110 - before dinner: 95-143 >> 97-103 >> 86-120 >> 82, 86>>  82-91 >> 211, 209 >> n/c - 2h after dinner: 118 >> 131-174 >> 88x1, 120-188 >> 96-172 >> 124 >> 165 - bedtime: 95-161 (203) >> 117-146, 270x1 >> n/c >> 115-154 >> 120-144 >> 114, 228 >> n/c - nighttime: n/c No lows. Lowest sugar was 70x1 >> 83 >> n/c; she has hypoglycemia awareness at 87.  Highest sugar was 270 >> 188 >> 164 >> 162  Pt's meals are: - Breakfast: cereals + 2% milk; bread + cheese; bread + PB - Lunch: sandwich; soup - Dinner: chicken; steak; seafood - Snacks: 2: PB crackers  She continues to walk.  - no CKD, last BUN/creatinine:  Lab Results  Component Value Date   BUN 19 05/09/2015   CREATININE 0.89 05/09/2015   - last set of lipids: Lab Results  Component Value Date   CHOL 145 05/09/2015   HDL 44.50 05/09/2015   LDLCALC 86 05/09/2015   LDLDIRECT 163.1 07/04/2011   TRIG 73.0  05/09/2015   CHOLHDL 3 05/09/2015  On Red yeast rice and Lipitor.  - last eye exam was in 05/13/2014. No DR.  - no numbness and tingling in her feet.  I reviewed pt's medications, allergies, PMH, social hx, family hx, and changes were documented in the history of present illness. Otherwise, unchanged from my initial visit note.  ROS: Constitutional: no weight gain/loss, no fatigue, no subjective hyperthermia/hypothermia Eyes: no blurry vision, no xerophthalmia ENT: no sore throat, no nodules palpated in throat, no dysphagia/odynophagia, no hoarseness Cardiovascular: no CP/SOB/palpitations/no leg swelling Respiratory: no cough/SOB Gastrointestinal: no N/V/D/C Musculoskeletal: no muscle/joint aches/+ bone pain  - feet Skin: no rashes Neurological: no tremors/numbness/tingling/dizziness  PE: BP 110/60 mmHg  Pulse 66  Temp(Src) 98.4 F (36.9 C) (Oral)  Resp 12  Wt 197 lb 9.6 oz (89.631 kg)  SpO2 97%  LMP  (LMP Unknown) Body mass index is 32.37 kg/(m^2). Wt Readings from Last 3 Encounters:  05/12/15 197 lb 9.6 oz (89.631 kg)  01/20/15 197 lb 8 oz (89.585 kg)  12/14/14 197 lb 9.6 oz (89.631 kg)   Constitutional: obese, in NAD Eyes: PERRLA, EOMI, no exophthalmos ENT: moist mucous membranes, no thyromegaly, no  cervical lymphadenopathy Cardiovascular: RRR, No MRG Respiratory: CTA B Gastrointestinal: abdomen soft, NT, ND, BS+ Musculoskeletal: no deformities, strength intact in all 4 Skin: moist, warm, no rashes Neurological: no tremor with outstretched hands, DTR normal in all 4  ASSESSMENT: 1. DM2, insulin-dependent, controlled, without complications  PLAN:  1. Patient with long-standing, now controlled diabetes, with improvement in control on basal insulin and oral antidiabetic regimen but now higher over the Holidays and also as she has foot pain and cannot walk (usually walks 2 mi in the [place]). Highest sugars are in am, but she has very few checks later in thre day (lower) >>  will move all Metformin ER at dinnertime. - reviewed latest HbA1c >> 7% (higher) - she is contemplating gastric sleeve sx - I suggested to:  Patient Instructions  Please continue: - Lantus 12 units at bedtime - Januvia 100 mg in am  Please move all Metformin ER at dinnertime (2000 mg).  Please return in 3 months with your sugar log.    - continue checking sugars at different times of the day - check 1-2 times a day, rotating checks - advised for yearly eye exams, she is up to date - given flu shot at last visit - Return to clinic in 4 mo with sugar log

## 2015-05-16 ENCOUNTER — Encounter: Payer: Self-pay | Admitting: Family Medicine

## 2015-05-16 ENCOUNTER — Ambulatory Visit (INDEPENDENT_AMBULATORY_CARE_PROVIDER_SITE_OTHER): Payer: 59 | Admitting: Family Medicine

## 2015-05-16 VITALS — BP 92/60 | HR 58 | Temp 98.2°F | Ht 65.5 in | Wt 194.0 lb

## 2015-05-16 DIAGNOSIS — Z794 Long term (current) use of insulin: Secondary | ICD-10-CM

## 2015-05-16 DIAGNOSIS — E7800 Pure hypercholesterolemia, unspecified: Secondary | ICD-10-CM | POA: Diagnosis not present

## 2015-05-16 DIAGNOSIS — Z7189 Other specified counseling: Secondary | ICD-10-CM

## 2015-05-16 DIAGNOSIS — Z0000 Encounter for general adult medical examination without abnormal findings: Secondary | ICD-10-CM | POA: Diagnosis not present

## 2015-05-16 DIAGNOSIS — Z1211 Encounter for screening for malignant neoplasm of colon: Secondary | ICD-10-CM

## 2015-05-16 DIAGNOSIS — E1165 Type 2 diabetes mellitus with hyperglycemia: Secondary | ICD-10-CM

## 2015-05-16 DIAGNOSIS — I4891 Unspecified atrial fibrillation: Secondary | ICD-10-CM

## 2015-05-16 DIAGNOSIS — Z01818 Encounter for other preprocedural examination: Secondary | ICD-10-CM | POA: Insufficient documentation

## 2015-05-16 LAB — MICROALBUMIN / CREATININE URINE RATIO
Creatinine,U: 170 mg/dL
Microalb Creat Ratio: 0.9 mg/g (ref 0.0–30.0)
Microalb, Ur: 1.5 mg/dL (ref 0.0–1.9)

## 2015-05-16 NOTE — Progress Notes (Signed)
I have personally reviewed the Medicare Annual Wellness questionnaire and have noted 1. The patient's medical and social history 2. Their use of alcohol, tobacco or illicit drugs 3. Their current medications and supplements 4. The patient's functional ability including ADL's, fall risks, home safety risks and hearing or visual             impairment. 5. Diet and physical activities 6. Evidence for depression or mood disorders 7.         Updated provider list Cognitive evaluation was performed and recorded on pt medicare questionnaire form. The patients weight, height, BMI and visual acuity have been recorded in the chart  I have made referrals, counseling and provided education to the patient based review of the above and I have provided the pt with a written personalized care plan for preventive services.    She is doing well overall.  Afib followed by cards on eliquis.  Depression in remission.  Diabetes: followed by ENDO. Dr. Letta Median, on metformin , lantus and onglyza. Working on lifestyle changes. Lab Results  Component Value Date   HGBA1C 7.0* 05/09/2015   Elevated Cholesterol: LDL at goal < 100 on lipitor 10 mg daily Lab Results  Component Value Date   CHOL 145 05/09/2015   HDL 44.50 05/09/2015   LDLCALC 86 05/09/2015   LDLDIRECT 163.1 07/04/2011   TRIG 73.0 05/09/2015   CHOLHDL 3 05/09/2015  Muscle aches: No Diet compliance: Good  Exercise: 3-5 days a week, walking  BP Readings from Last 3 Encounters:  05/16/15 92/60  05/12/15 110/60  01/20/15 116/62    Wt Readings from Last 3 Encounters:  05/16/15 194 lb (87.998 kg)  05/12/15 197 lb 9.6 oz (89.631 kg)  01/20/15 197 lb 8 oz (89.585 kg)  Social History /Family History/Past Medical History reviewed and updated if needed.  Review of Systems  Constitutional: Negative for fever and some fatigue.  HENT: Negative for ear pain.  Eyes: Negative for pain.  Respiratory: Negative for chest tightness and  shortness of breath.  Cardiovascular: Negative for chest pain, palpitations and leg swelling.  Gastrointestinal: Negative for abdominal pain.  Genitourinary: Negative for dysuria.  Objective:   Physical Exam  Constitutional: Vital signs are normal. She appears well-developed and well-nourished. She is cooperative. Non-toxic appearance. She does not appear ill. No distress.  HENT:  Head: Normocephalic.  Right Ear: Hearing, tympanic membrane, external ear and ear canal normal. Tympanic membrane is not erythematous, not retracted and not bulging.  Left Ear: Hearing, tympanic membrane, external ear and ear canal normal. Tympanic membrane is not erythematous, not retracted and not bulging.  Nose: No mucosal edema or rhinorrhea. Right sinus exhibits no maxillary sinus tenderness and no frontal sinus tenderness. Left sinus exhibits no maxillary sinus tenderness and no frontal sinus tenderness.  Mouth/Throat: Uvula is midline, oropharynx is clear and moist and mucous membranes are normal.  Eyes: Conjunctivae, EOM and lids are normal. Pupils are equal, round, and reactive to light. No foreign bodies found.  Neck: Trachea normal and normal range of motion. Neck supple. Carotid bruit is not present. No mass and no thyromegaly present.  Cardiovascular: Normal rate, regular rhythm, S1 normal, S2 normal, normal heart sounds, intact distal pulses and normal pulses. Exam reveals no gallop and no friction rub.  No murmur heard.  Pulmonary/Chest: Effort normal and breath sounds normal. Not tachypneic. No respiratory distress. She has no decreased breath sounds. She has no wheezes. She has no rhonchi. She has no rales.  Abdominal: Soft.  Normal appearance and bowel sounds are normal. There is no tenderness.  Neurological: She is alert.  Skin: Skin is warm, dry and intact. No rash noted.  Psychiatric: Her speech is normal and behavior is normal. Judgment and thought content normal. Her mood appears  not anxious. Cognition and memory are normal. She does not exhibit a depressed mood.  Normal introitus for age, no external lesions, no vaginal discharge, mucosa pink and moist, no vaginal no vaginal atrophy, no friaility or hemorrhage, no uterus , no adnexal masses or tenderness. No breast lesions, significant scarring stable bilaterally from breast reduction in past. Chaperoned exam.   Assessment and PLAN: The patient's preventative maintenance and recommended screening tests for an annual wellness exam were reviewed in full today. Brought up to date unless services declined.  Counselled on the importance of diet, exercise, and its role in overall health and mortality. The patient's FH and SH was reviewed, including their home life, tobacco status, and drug and alcohol status.   Vaccines: Uptodate with PNA, Tdap, shingles, flu . Mammo: 06/2014, will schedule Colon: 07/2003, Dr. Carlean Purl, She would like to do ifob instead year;ly DEXA:06/2014 normal , repeat in 5 years Nonsmoker PAP/DVE: has ovaries, had partial hysterectomy. DVE yearly, no pap. Hep C : done

## 2015-05-16 NOTE — Addendum Note (Signed)
Addended by: Marchia Bond on: 05/16/2015 08:15 AM   Modules accepted: Orders, SmartSet

## 2015-05-16 NOTE — Assessment & Plan Note (Signed)
Followed by ENDO, well controlled. Encouraged exercise, weight loss, healthy eating habits.  Due for microalbumin.

## 2015-05-16 NOTE — Progress Notes (Signed)
Pre visit review using our clinic review tool, if applicable. No additional management support is needed unless otherwise documented below in the visit note. 

## 2015-05-16 NOTE — Assessment & Plan Note (Signed)
HCPOA ( husband, has living will, full code ( reviewed 2017)

## 2015-05-16 NOTE — Assessment & Plan Note (Signed)
Stable on eliquis for anticoag. Metoprolol for rate control. Followed by cards.

## 2015-05-16 NOTE — Assessment & Plan Note (Signed)
Well controlled. Continue current medication.  

## 2015-05-16 NOTE — Patient Instructions (Addendum)
Stop at lab on way out to pick up the stool test and to get urine microalbumin.  Keep working on healthy eating and regular exercise!

## 2015-05-23 ENCOUNTER — Other Ambulatory Visit: Payer: Self-pay

## 2015-05-23 DIAGNOSIS — Z1231 Encounter for screening mammogram for malignant neoplasm of breast: Secondary | ICD-10-CM

## 2015-05-24 ENCOUNTER — Other Ambulatory Visit: Payer: Self-pay | Admitting: Family Medicine

## 2015-05-25 ENCOUNTER — Other Ambulatory Visit (INDEPENDENT_AMBULATORY_CARE_PROVIDER_SITE_OTHER): Payer: 59

## 2015-05-25 ENCOUNTER — Encounter: Payer: Self-pay | Admitting: *Deleted

## 2015-05-25 DIAGNOSIS — Z1211 Encounter for screening for malignant neoplasm of colon: Secondary | ICD-10-CM | POA: Diagnosis not present

## 2015-05-25 LAB — FECAL OCCULT BLOOD, IMMUNOCHEMICAL: Fecal Occult Bld: NEGATIVE

## 2015-06-12 ENCOUNTER — Ambulatory Visit (INDEPENDENT_AMBULATORY_CARE_PROVIDER_SITE_OTHER): Payer: 59 | Admitting: Internal Medicine

## 2015-06-12 ENCOUNTER — Encounter: Payer: Self-pay | Admitting: Internal Medicine

## 2015-06-12 VITALS — BP 142/70 | HR 71 | Temp 97.3°F | Wt 199.5 lb

## 2015-06-12 DIAGNOSIS — R300 Dysuria: Secondary | ICD-10-CM | POA: Diagnosis not present

## 2015-06-12 DIAGNOSIS — R3915 Urgency of urination: Secondary | ICD-10-CM

## 2015-06-12 DIAGNOSIS — R350 Frequency of micturition: Secondary | ICD-10-CM

## 2015-06-12 LAB — POC URINALSYSI DIPSTICK (AUTOMATED)
Bilirubin, UA: NEGATIVE
GLUCOSE UA: NEGATIVE
Ketones, UA: NEGATIVE
Leukocytes, UA: NEGATIVE
NITRITE UA: NEGATIVE
Protein, UA: NEGATIVE
RBC UA: NEGATIVE
Spec Grav, UA: 1.02
UROBILINOGEN UA: 0.2
pH, UA: 6

## 2015-06-12 NOTE — Progress Notes (Signed)
Pre visit review using our clinic review tool, if applicable. No additional management support is needed unless otherwise documented below in the visit note. 

## 2015-06-12 NOTE — Patient Instructions (Signed)

## 2015-06-12 NOTE — Progress Notes (Signed)
HPI  Pt presents to the clinic today with c/o bladder pressure, urgency, frequency and dysuria. This started yesterday. She denies fever, chills or nausea. She has had some low back pain. She denies vaginal complaints. She has tried AZO with good relief.   Review of Systems  Past Medical History  Diagnosis Date  . Disorders of bursae and tendons in shoulder region, unspecified   . Headache(784.0)   . Other urinary incontinence   . Diverticulosis of colon (without mention of hemorrhage)   . Internal hemorrhoids without mention of complication   . Obesity, unspecified   . Depressive disorder, not elsewhere classified   . Diaphragmatic hernia without mention of obstruction or gangrene   . Pure hypercholesterolemia   . Diabetes mellitus without mention of complication   . Paroxysmal atrial fibrillation (HCC)     Family History  Problem Relation Age of Onset  . Cancer Mother     fullopian tube  . Dementia Mother   . Pulmonary embolism Mother   . Hyperlipidemia Mother   . Cancer Father     lung  . Hyperlipidemia Father   . Hypertension Sister   . Diabetes Brother   . Stroke Paternal Grandmother   . Stroke Paternal Grandfather     Social History   Social History  . Marital Status: Married    Spouse Name: N/A  . Number of Children: 2  . 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 Smoking status: Never Smoker   . Smokeless tobacco: Never Used  . Alcohol Use: No  . Drug Use: No  . Sexual Activity: Not on file   Other Topics Concern  . Not on file   Social History Narrative   Exercise walks 3 times daily         Diet fruit, occ fast food, salads and lean meat    Allergies  Allergen Reactions  . Cortisone   . Pravastatin Sodium     REACTION: leg pain    Constitutional: Denies fever, malaise, fatigue, headache or abrupt weight changes.   GU: Pt reports urgency, frequency and pain with urination.  Denies burning sensation, blood in urine, odor or discharge. Skin: Denies redness, rashes, lesions or ulcercations.   No other specific complaints in a complete review of systems (except as listed in HPI above).    Objective:   Physical Exam  BP 142/70 mmHg  Pulse 71  Temp(Src) 97.3 F (36.3 C) (Oral)  Wt 199 lb 8 oz (90.493 kg)  SpO2 97%  LMP  (LMP Unknown) Wt Readings from Last 3 Encounters:  06/12/15 199 lb 8 oz (90.493 kg)  05/16/15 194 lb (87.998 kg)  05/12/15 197 lb 9.6 oz (89.631 kg)    General: Appears her stated age, obese in NAD. Cardiovascular: Normal rate and rhythm. S1,S2 noted.   Pulmonary/Chest: Normal effort and positive vesicular breath sounds. No respiratory distress. No wheezes, rales or ronchi noted.  Abdomen: Soft. Normal bowel sounds. No distention or masses noted.  Tender to palpation over the bladder area. No CVA tenderness.      Assessment & Plan:   Urgency, Frequency, Dysuria secondary to Cystitis  Urinalysis: normal Will send urine culture Avoid bladder irritants: nicotine, caffeine, etc OK to take AZO OTC Drink plenty of fluids  RTC as needed or if symptoms persist.

## 2015-06-13 NOTE — Addendum Note (Signed)
Addended by: Royann Shivers A on: 06/13/2015 09:27 AM   Modules accepted: Orders

## 2015-06-15 LAB — URINE CULTURE
COLONY COUNT: NO GROWTH
ORGANISM ID, BACTERIA: NO GROWTH

## 2015-06-22 ENCOUNTER — Ambulatory Visit
Admission: RE | Admit: 2015-06-22 | Discharge: 2015-06-22 | Disposition: A | Discharge status: Home / Self Care | Payer: 59 | Source: Ambulatory Visit

## 2015-06-22 DIAGNOSIS — Z1231 Encounter for screening mammogram for malignant neoplasm of breast: Secondary | ICD-10-CM

## 2015-06-28 ENCOUNTER — Telehealth: Payer: Self-pay | Admitting: *Deleted

## 2015-06-28 NOTE — Telephone Encounter (Signed)
Order for CPAP Replacement w/humidity @ 13 cm H20-Dx:G47.30 faxed to [facility] at [phone], as requested by Ms. Berntsen.

## 2015-06-30 ENCOUNTER — Other Ambulatory Visit: Payer: Self-pay | Admitting: Family Medicine

## 2015-08-07 ENCOUNTER — Other Ambulatory Visit: Payer: Self-pay | Admitting: *Deleted

## 2015-08-07 MED ORDER — LINAGLIPTIN 5 MG PO TABS: 5.0000 mg | ORAL_TABLET | Freq: Every day | ORAL | Status: DC

## 2015-08-07 NOTE — Telephone Encounter (Signed)
Pt's ins does not cover Januvia. Switch to preferred, Tradjenta 5 mg qd, per Dr Cruzita Lederer.

## 2015-08-09 ENCOUNTER — Telehealth: Payer: Self-pay | Admitting: Family Medicine

## 2015-08-09 NOTE — Telephone Encounter (Signed)
Pt needs a doctor note giving permission to workout at [facility]. (due to being diabetic) Please call [phone] when letter is ready.

## 2015-08-10 ENCOUNTER — Encounter: Payer: Self-pay | Admitting: *Deleted

## 2015-08-10 NOTE — Telephone Encounter (Signed)
Okay to write note and stamp.[name] or await my return

## 2015-08-10 NOTE — Telephone Encounter (Signed)
Letter written and placed in Dr. Rometta Emery box for review and to sign.

## 2015-08-11 NOTE — Telephone Encounter (Signed)
Ms. Vanhorn notified letter is ready to be picked up at the front desk.

## 2015-08-24 ENCOUNTER — Ambulatory Visit (INDEPENDENT_AMBULATORY_CARE_PROVIDER_SITE_OTHER): Payer: 59 | Admitting: Internal Medicine

## 2015-08-24 ENCOUNTER — Encounter: Payer: Self-pay | Admitting: Internal Medicine

## 2015-08-24 VITALS — BP 112/70 | HR 70 | Ht 65.5 in | Wt 198.0 lb

## 2015-08-24 DIAGNOSIS — Z794 Long term (current) use of insulin: Secondary | ICD-10-CM | POA: Diagnosis not present

## 2015-08-24 DIAGNOSIS — E1165 Type 2 diabetes mellitus with hyperglycemia: Secondary | ICD-10-CM | POA: Diagnosis not present

## 2015-08-24 LAB — POCT GLYCOSYLATED HEMOGLOBIN (HGB A1C): Hemoglobin A1C: 6.3

## 2015-08-24 NOTE — Progress Notes (Signed)
Patient ID: [name], female   DOB: 1945/08/17, 70 y.o.   MRN: [id number]  HPI: [name] is a 70 y.o.-year-old female, returning for f/u for DM2, dx 1995, insulin-dependent since 2014, uncontrolled, without complications. Last visit 5 mo ago.   She has pain in dorsum of both feet >> lack of exercise.  Last hemoglobin A1c was: Lab Results  Component Value Date   HGBA1C 6.3 08/24/2015   HGBA1C 7.0* 05/09/2015   HGBA1C 6.8 12/14/2014   Pt is on a regimen of: - Metformin XR 1000 mg 2x a day - Januvia 100 mg daily in am >> Tradjenta 5 mg daily - Lantus 22 >> 18 units in am >> 12 units in hs   Pt checks her sugars 2 a day and they are (per meter download): - am: 89-130, 150 >> 83-107, 139 >> 81-139, 144 >> 93-145, 155, 166 (cruise) >> 104-147, 155, 162 >> 90-133 - 2h after b'fast: 112-139 >> 117-145 >> 120-146 >> 93-164 >> 93-164 >> 110-131, 308 x1 >> 117 >> 90-103 - before lunch: 77-150 >> 113-137 >> 70-120 >> 74, 88 >> n/c >> 120, 147 >> n/c >> 106 - 2h after lunch: 65-188 >> 96-163 >> 138-158, 184 >> 130 >> 90-116 >> 116, 139 >> 110 >> 96, 124, 198 - before dinner: 95-143 >> 97-103 >> 86-120 >> 82, 86>>  82-91 >> 211, 209 >> n/c  - 2h after dinner: 118 >> 131-174 >> 88x1, 120-188 >> 96-172 >> 124 >> 165 >> 118, 167-225, 254 - bedtime: 95-161 (203) >> 117-146, 270x1 >> n/c >> 115-154 >> 120-144 >> 114, 228 >> n/c - nighttime: n/c No lows. Lowest sugar was 70x1 >> 83 >> n/c ; she has hypoglycemia awareness at 87.  Highest sugar was 270 >> 188 >> 164 >> 162 >> 254.  Pt's meals are: - Breakfast: cereals + 2% milk; bread + cheese; bread + PB - Lunch: sandwich; soup - Dinner: chicken; steak; seafood - Snacks: 2: PB crackers  She continues to walk.  - no CKD, last BUN/creatinine:  Lab Results  Component Value Date   BUN 19 05/09/2015   CREATININE 0.89 05/09/2015   - last set of lipids: Lab Results  Component Value Date   CHOL 145 05/09/2015   HDL 44.50 05/09/2015    LDLCALC 86 05/09/2015   LDLDIRECT 163.1 07/04/2011   TRIG 73.0 05/09/2015   CHOLHDL 3 05/09/2015  On Lipitor.  - last eye exam was in 05/2015 - [facility]. No DR. + cataracts. - no numbness and tingling in her feet.  I reviewed pt's medications, allergies, PMH, social hx, family hx, and changes were documented in the history of present illness. Otherwise, unchanged from my initial visit note.  ROS: Constitutional: no weight gain/loss, no fatigue, no subjective hyperthermia/hypothermia Eyes: no blurry vision, no xerophthalmia ENT: no sore throat, no nodules palpated in throat, no dysphagia/odynophagia, no hoarseness Cardiovascular: no CP/SOB/palpitations/no leg swelling Respiratory: no cough/SOB Gastrointestinal: no N/V/D/C Musculoskeletal: no muscle/joint aches Skin: no rashes Neurological: no tremors/numbness/tingling/dizziness  PE: BP 112/70 mmHg  Pulse 70  Ht 5' 5.5" (1.664 m)  Wt 198 lb (89.812 kg)  BMI 32.44 kg/m2  SpO2 97%  LMP  (LMP Unknown) Body mass index is 32.44 kg/(m^2). Wt Readings from Last 3 Encounters:  08/24/15 198 lb (89.812 kg)  06/12/15 199 lb 8 oz (90.493 kg)  05/16/15 194 lb (87.998 kg)   Constitutional: obese, in NAD Eyes: PERRLA, EOMI, no exophthalmos ENT: moist mucous  membranes, no thyromegaly, no cervical lymphadenopathy Cardiovascular: RRR, No MRG Respiratory: CTA B Gastrointestinal: abdomen soft, NT, ND, BS+ Musculoskeletal: no deformities, strength intact in all 4 Skin: moist, warm, no rashes Neurological: no tremor with outstretched hands, DTR normal in all 4  ASSESSMENT: 1. DM2, insulin-dependent, controlled, without complications - she is contemplating gastric sleeve sx  PLAN:  1. Patient with long-standing, now controlled diabetes, with improvement in control on basal insulin and oral antidiabetic regimen Sugars are higher after dinner >> will move Tradjenta before lunch. Also advised her to work on reducing her carbs with  dinner. - I suggested to:  Patient Instructions  Please continue: - Lantus 12 units at bedtime - Metformin ER 1000 mg 2x a day  Please move: - Tradjenta 5 mg before lunch  Please come back for a follow-up appointment in 3 months.  - continue checking sugars at different times of the day - check 1-2 times a day, rotating checks - advised for yearly eye exams, she is up to date - checked HbA1c today >> 6.3% (excellent) - Return to clinic in 3 mo with sugar log

## 2015-08-24 NOTE — Patient Instructions (Signed)
Please continue: - Lantus 12 units at bedtime - Metformin ER 1000 mg 2x a day  Please move: - Tradjenta 5 mg before lunch  Please come back for a follow-up appointment in 3 months.

## 2015-08-30 ENCOUNTER — Telehealth: Payer: Self-pay | Admitting: Cardiovascular Disease

## 2015-08-30 NOTE — Telephone Encounter (Signed)
Patient says she is feeling much better now.  She will keep track of vs and call again if needed.

## 2015-08-30 NOTE — Telephone Encounter (Signed)
Pt c/o BP issue: STAT if pt c/o blurred vision, one-sided weakness or slurred speech  1. What are your last 5 BP readings? 08/29/15 8am 119/48 p 72, 3 pm 113/54 p 75, 6pm 112/46 p 72, 11 pm 127/62 p 68, 08/30/15 8am 127/65 p 125, 10 am 105/62 p139  2. Are you having any other symptoms (ex. Dizziness, headache, blurred vision, passed out)?  Very tired (0-10/ 7)  3. What is your BP issue? BP meds due to afib feels like bp is too low.

## 2015-08-30 NOTE — Telephone Encounter (Signed)
S/w pt who reports improved HR. She is not home at this time and can't remember the reading but states she knows it is better and feels well. Advised pt to continue monitoring and call if HR elevated w/no improvement after medications. She verbalized understanding and is agreeable w/plan.

## 2015-08-30 NOTE — Telephone Encounter (Signed)
S/w pt who called to report BP and HR readings listed below. HR 125 and 139 this morning. Pt states she feels fine. She did not take evening dose of metoprolol last night because she felt her BP was too low (127/62) however, she did take it today at 10am. Reviewed possible reasons for low BP. Pt agrees she could be dehydrated and will increase fluids.  She takes metoprolol for afib but can not tell when/if she goes into afib. Advised pt to increase fluids, and recheck HR one hour after taking metoprolol. She will call back if HR is not improved this morning. Advised pt she could come for EKG today if HR is not improved. Pt verbalized understanding and is agreeable w/plan.

## 2015-09-12 ENCOUNTER — Telehealth: Payer: Self-pay | Admitting: Internal Medicine

## 2015-09-12 ENCOUNTER — Other Ambulatory Visit: Payer: Self-pay

## 2015-09-12 MED ORDER — BASAGLAR KWIKPEN 100 UNIT/ML ~~LOC~~ SOPN: PEN_INJECTOR | SUBCUTANEOUS | Status: DC

## 2015-09-12 NOTE — Telephone Encounter (Signed)
Patient insurance no longer covering for Lantus, they prefer Basaglar, or Levemir? Please advise if okay to change med. Thank you!

## 2015-09-12 NOTE — Telephone Encounter (Signed)
[facility] called, PT insurance is no longer paying for Lantus, they prefer [place] or Levemir.  Need new script faxed over.

## 2015-09-12 NOTE — Telephone Encounter (Signed)
Sent in [facility] insulin into patient pharmacy. Discontinued Lantus per MD request, as patient insurance would not cover Lantus.

## 2015-09-12 NOTE — Telephone Encounter (Signed)
Please send Basaglar, same dose.

## 2015-10-30 ENCOUNTER — Other Ambulatory Visit: Payer: Self-pay | Admitting: Internal Medicine

## 2015-11-24 ENCOUNTER — Encounter: Payer: Self-pay | Admitting: Internal Medicine

## 2015-11-24 ENCOUNTER — Ambulatory Visit (INDEPENDENT_AMBULATORY_CARE_PROVIDER_SITE_OTHER): Payer: 59 | Admitting: Internal Medicine

## 2015-11-24 VITALS — BP 142/82 | HR 70 | Ht 65.5 in | Wt 193.0 lb

## 2015-11-24 DIAGNOSIS — Z23 Encounter for immunization: Secondary | ICD-10-CM | POA: Diagnosis not present

## 2015-11-24 DIAGNOSIS — E1165 Type 2 diabetes mellitus with hyperglycemia: Secondary | ICD-10-CM

## 2015-11-24 DIAGNOSIS — Z794 Long term (current) use of insulin: Secondary | ICD-10-CM | POA: Diagnosis not present

## 2015-11-24 LAB — POCT GLYCOSYLATED HEMOGLOBIN (HGB A1C): Hemoglobin A1C: 6.8

## 2015-11-24 MED ORDER — BASAGLAR KWIKPEN 100 UNIT/ML ~~LOC~~ SOPN: PEN_INJECTOR | SUBCUTANEOUS | 3 refills | Status: DC

## 2015-11-24 MED ORDER — LINAGLIPTIN 5 MG PO TABS: 5.0000 mg | ORAL_TABLET | Freq: Every day | ORAL | 11 refills | Status: DC

## 2015-11-24 MED ORDER — METFORMIN HCL ER 500 MG PO TB24: 2000.0000 mg | ORAL_TABLET | Freq: Every day | ORAL | 3 refills | Status: DC

## 2015-11-24 MED ORDER — GLUCOSE BLOOD VI STRP: ORAL_STRIP | 3 refills | Status: DC

## 2015-11-24 NOTE — Addendum Note (Signed)
Addended by: Caprice Beaver T on: 11/24/2015 09:42 AM   Modules accepted: Orders

## 2015-11-24 NOTE — Patient Instructions (Addendum)
Please try to increase: - Basaglar from 12 units to 14 units at bedtime.   Please continue: - Metformin ER 1000 mg 2x a day - Tradjenta 5 mg before lunch  Please come back for a follow-up appointment in 3 months.

## 2015-11-24 NOTE — Progress Notes (Signed)
Patient ID: IRATZE CURLER, female   DOB: Feb 23, 1946, 70 y.o.   MRN: [id number]  HPI: [name] is a 70 y.o.-year-old female, returning for f/u for DM2, dx 1995, insulin-dependent since 2014, uncontrolled, without complications. Last visit 3 mo ago.   She lost 5 lbs since last visit. She is going to the gym at least 3x a week.  Last hemoglobin A1c was: Lab Results  Component Value Date   HGBA1C 6.3 08/24/2015   HGBA1C 7.0 (H) 05/09/2015   HGBA1C 6.8 12/14/2014   Pt is on a regimen of: - Metformin XR 1000 mg 2x a day - Januvia 100 mg daily in am >> Tradjenta 5 mg daily before lunch - Lantus 22 >> 18 units in am >> Basaglar 12 units in hs  Pt checks her sugars 2 a day and they are (per meter download): - am: 83-107, 139 >> 81-139, 144 >> 93-145, 155, 166 (cruise) >> 104-147, 155, 162 >> 90-133 >> 96-157, 189 - 2h after b'fast: 112-139 >> 117-145 >> 120-146 >> 93-164 >> 93-164 >> 110-131, 308 x1 >> 117 >> 90-103 >> 113-141 - before lunch: 77-150 >> 113-137 >> 70-120 >> 74, 88 >> n/c >> 120, 147 >> n/c >> 106 >> 123-134 - 2h after lunch: 65-188 >> 96-163 >> 138-158, 184 >> 130 >> 90-116 >> 116, 139 >> 110 >> 96, 124, 198 >> 97, 103, 203 - before dinner: 95-143 >> 97-103 >> 86-120 >> 82, 86>>  82-91 >> 211, 209 >> n/c >> 102-144 - 2h after dinner: 118 >> 131-174 >> 88x1, 120-188 >> 96-172 >> 124 >> 165 >> 118, 167-225, 254 >> 139 - bedtime: 95-161 (203) >> 117-146, 270x1 >> n/c >> 115-154 >> 120-144 >> 114, 228 >> n/c - nighttime: n/c No lows. Lowest sugar was 70x1 >> 83 >> n/c >> 96; she has hypoglycemia awareness at 87.  Highest sugar was 270 >> 188 >> 164 >> 162 >> 254 >> 203.  Pt's meals are: - Breakfast: cereals + 2% milk; bread + cheese; bread + PB - Lunch: sandwich; soup - Dinner: chicken; steak; seafood - Snacks: 2: PB crackers  She continues to walk for exercise.  - no CKD, last BUN/creatinine:  Lab Results  Component Value Date   BUN 19 05/09/2015   CREATININE 0.89  05/09/2015   - last set of lipids: Lab Results  Component Value Date   CHOL 145 05/09/2015   HDL 44.50 05/09/2015   LDLCALC 86 05/09/2015   LDLDIRECT 163.1 07/04/2011   TRIG 73.0 05/09/2015   CHOLHDL 3 05/09/2015  On Lipitor.  - last eye exam was in 05/2015 - [place]. No DR. + cataracts. - no numbness and tingling in her feet.  I reviewed pt's medications, allergies, PMH, social hx, family hx, and changes were documented in the history of present illness. Otherwise, unchanged from my initial visit note.  ROS: Constitutional: no weight gain/loss, no fatigue, no subjective hyperthermia/hypothermia Eyes: no blurry vision, no xerophthalmia ENT: no sore throat, no nodules palpated in throat, no dysphagia/odynophagia, no hoarseness Cardiovascular: no CP/SOB/palpitations/no leg swelling Respiratory: no cough/SOB Gastrointestinal: no N/V/D/C Musculoskeletal: no muscle/joint aches Skin: no rashes Neurological: no tremors/numbness/tingling/dizziness  PE: BP (!) 142/82 (BP Location: Left Arm, Patient Position: Sitting)   Pulse 70   Ht 5' 5.5" (1.664 m)   Wt 193 lb (87.5 kg)   LMP  (LMP Unknown)   SpO2 98%   BMI 31.63 kg/m  Body mass index is 31.63 kg/m. Wt  Readings from Last 3 Encounters:  11/24/15 193 lb (87.5 kg)  08/24/15 198 lb (89.8 kg)  06/12/15 199 lb 8 oz (90.5 kg)   Constitutional: obese, in NAD Eyes: PERRLA, EOMI, no exophthalmos ENT: moist mucous membranes, no thyromegaly, no cervical lymphadenopathy Cardiovascular: RRR, No MRG Respiratory: CTA B Gastrointestinal: abdomen soft, NT, ND, BS+ Musculoskeletal: no deformities, strength intact in all 4 Skin: moist, warm, no rashes Neurological: no tremor with outstretched hands, DTR normal in all 4  ASSESSMENT: 1. DM2, insulin-dependent, controlled, without long term complications, but with hyperglycemia - she is contemplating gastric sleeve sx  PLAN:  1. Patient with long-standing, now controlled diabetes,  with improvement in control on basal insulin and oral antidiabetic regimen. At this visit, though, sugars are a little higher (dietary indiscretions). Will try to increase Basaglar a little. - I suggested to:  Patient Instructions  Please try to increase: - Basaglar from 12 units to 14 units at bedtime.   Please continue: - Metformin ER 1000 mg 2x a day - Tradjenta 5 mg before lunch  Please come back for a follow-up appointment in 3 months.  - continue checking sugars at different times of the day - check 1-2 times a day, rotating checks - advised for yearly eye exams, she is up to date - will give the flu shot today - checked HbA1c today >> 6.8% (higher) - Return to clinic in 3 mo with sugar log   [name], MD PhD [facility]

## 2015-11-28 ENCOUNTER — Other Ambulatory Visit: Payer: Self-pay | Admitting: Family Medicine

## 2015-11-30 ENCOUNTER — Other Ambulatory Visit: Payer: Self-pay | Admitting: Family Medicine

## 2015-12-09 ENCOUNTER — Other Ambulatory Visit: Payer: Self-pay | Admitting: Family Medicine

## 2015-12-12 ENCOUNTER — Telehealth: Payer: Self-pay | Admitting: Internal Medicine

## 2015-12-12 ENCOUNTER — Telehealth: Payer: Self-pay | Admitting: Family Medicine

## 2015-12-12 NOTE — Telephone Encounter (Signed)
[name], see below

## 2015-12-12 NOTE — Telephone Encounter (Signed)
Refill was sent into [facility] on 12/10/2015.  I called and spoke to pharmacy at [facility] and apparently Mrs. Burkett has an account under [place] and one under [place].  They do have the refill and will get it ready for the patient as well as have the pharmacist merge those two accounts.  [name] notified.

## 2015-12-12 NOTE — Telephone Encounter (Signed)
Pt came in to speak with you. Escitalopram was not at [facility] on [facility]. She has requested refill thru pharm several times. She requested a cb.

## 2015-12-12 NOTE — Telephone Encounter (Signed)
Patient need a PA for this  Medication [phone]  Eoc# [id number] time sensitive  Please advise

## 2015-12-14 ENCOUNTER — Telehealth: Payer: Self-pay

## 2015-12-14 NOTE — Telephone Encounter (Signed)
Sent basaglar PA into [facility] today 12/14/15 at 2:45. Will await response.

## 2015-12-14 NOTE — Telephone Encounter (Signed)
[facility] calling regarding the basaglar PA they are asking for this to be submitted asap please # [phone] Ref # [id number]

## 2015-12-15 ENCOUNTER — Other Ambulatory Visit: Payer: Self-pay

## 2015-12-15 MED ORDER — INSULIN GLARGINE 100 UNIT/ML SOLOSTAR PEN: 12.0000 [IU] | PEN_INJECTOR | Freq: Every day | SUBCUTANEOUS | 2 refills | Status: DC

## 2016-01-22 ENCOUNTER — Inpatient Hospital Stay
Admission: AD | Admit: 2016-01-22 | Discharge: 2016-01-25 | DRG: 308 | Disposition: A | Discharge status: Home / Self Care | Payer: 59 | Source: Ambulatory Visit | Attending: Internal Medicine | Admitting: Internal Medicine

## 2016-01-22 ENCOUNTER — Inpatient Hospital Stay (HOSPITAL_COMMUNITY)
Admission: AD | Admit: 2016-01-22 | Discharge: 2016-01-22 | Disposition: A | Discharge status: Home / Self Care | Payer: 59 | Source: Ambulatory Visit | Attending: Internal Medicine | Admitting: Internal Medicine

## 2016-01-22 ENCOUNTER — Encounter: Payer: Self-pay | Admitting: Internal Medicine

## 2016-01-22 ENCOUNTER — Encounter: Payer: Self-pay | Admitting: Cardiovascular Disease

## 2016-01-22 ENCOUNTER — Ambulatory Visit (INDEPENDENT_AMBULATORY_CARE_PROVIDER_SITE_OTHER): Payer: 59 | Admitting: Cardiovascular Disease

## 2016-01-22 VITALS — BP 116/72 | HR 150 | Ht 65.5 in | Wt 204.0 lb

## 2016-01-22 DIAGNOSIS — I480 Paroxysmal atrial fibrillation: Principal | ICD-10-CM | POA: Diagnosis present

## 2016-01-22 DIAGNOSIS — E785 Hyperlipidemia, unspecified: Secondary | ICD-10-CM | POA: Diagnosis present

## 2016-01-22 DIAGNOSIS — Z833 Family history of diabetes mellitus: Secondary | ICD-10-CM

## 2016-01-22 DIAGNOSIS — Z823 Family history of stroke: Secondary | ICD-10-CM | POA: Diagnosis not present

## 2016-01-22 DIAGNOSIS — E1159 Type 2 diabetes mellitus with other circulatory complications: Secondary | ICD-10-CM

## 2016-01-22 DIAGNOSIS — Z7901 Long term (current) use of anticoagulants: Secondary | ICD-10-CM | POA: Diagnosis not present

## 2016-01-22 DIAGNOSIS — R0602 Shortness of breath: Secondary | ICD-10-CM

## 2016-01-22 DIAGNOSIS — R002 Palpitations: Secondary | ICD-10-CM | POA: Diagnosis present

## 2016-01-22 DIAGNOSIS — E119 Type 2 diabetes mellitus without complications: Secondary | ICD-10-CM | POA: Diagnosis present

## 2016-01-22 DIAGNOSIS — Z794 Long term (current) use of insulin: Secondary | ICD-10-CM

## 2016-01-22 DIAGNOSIS — Z9889 Other specified postprocedural states: Secondary | ICD-10-CM

## 2016-01-22 DIAGNOSIS — I10 Essential (primary) hypertension: Secondary | ICD-10-CM | POA: Diagnosis not present

## 2016-01-22 DIAGNOSIS — Z888 Allergy status to other drugs, medicaments and biological substances status: Secondary | ICD-10-CM | POA: Diagnosis not present

## 2016-01-22 DIAGNOSIS — Z8249 Family history of ischemic heart disease and other diseases of the circulatory system: Secondary | ICD-10-CM | POA: Diagnosis not present

## 2016-01-22 DIAGNOSIS — Z90710 Acquired absence of both cervix and uterus: Secondary | ICD-10-CM | POA: Diagnosis not present

## 2016-01-22 DIAGNOSIS — I110 Hypertensive heart disease with heart failure: Secondary | ICD-10-CM | POA: Diagnosis present

## 2016-01-22 DIAGNOSIS — R0789 Other chest pain: Secondary | ICD-10-CM | POA: Diagnosis not present

## 2016-01-22 DIAGNOSIS — I5031 Acute diastolic (congestive) heart failure: Secondary | ICD-10-CM | POA: Diagnosis present

## 2016-01-22 DIAGNOSIS — I272 Pulmonary hypertension, unspecified: Secondary | ICD-10-CM | POA: Diagnosis present

## 2016-01-22 DIAGNOSIS — I4891 Unspecified atrial fibrillation: Secondary | ICD-10-CM | POA: Diagnosis not present

## 2016-01-22 DIAGNOSIS — Z79899 Other long term (current) drug therapy: Secondary | ICD-10-CM

## 2016-01-22 DIAGNOSIS — E1165 Type 2 diabetes mellitus with hyperglycemia: Secondary | ICD-10-CM | POA: Diagnosis not present

## 2016-01-22 DIAGNOSIS — E669 Obesity, unspecified: Secondary | ICD-10-CM | POA: Diagnosis present

## 2016-01-22 DIAGNOSIS — R062 Wheezing: Secondary | ICD-10-CM | POA: Diagnosis not present

## 2016-01-22 DIAGNOSIS — Z818 Family history of other mental and behavioral disorders: Secondary | ICD-10-CM | POA: Diagnosis not present

## 2016-01-22 DIAGNOSIS — Z809 Family history of malignant neoplasm, unspecified: Secondary | ICD-10-CM

## 2016-01-22 DIAGNOSIS — Z9049 Acquired absence of other specified parts of digestive tract: Secondary | ICD-10-CM | POA: Diagnosis not present

## 2016-01-22 DIAGNOSIS — I4892 Unspecified atrial flutter: Secondary | ICD-10-CM

## 2016-01-22 DIAGNOSIS — D649 Anemia, unspecified: Secondary | ICD-10-CM | POA: Diagnosis present

## 2016-01-22 DIAGNOSIS — I340 Nonrheumatic mitral (valve) insufficiency: Secondary | ICD-10-CM | POA: Diagnosis present

## 2016-01-22 DIAGNOSIS — I152 Hypertension secondary to endocrine disorders: Secondary | ICD-10-CM

## 2016-01-22 DIAGNOSIS — D509 Iron deficiency anemia, unspecified: Secondary | ICD-10-CM

## 2016-01-22 LAB — CBC
HEMATOCRIT: 29.9 % — AB (ref 35.0–47.0)
Hemoglobin: 9.9 g/dL — ABNORMAL LOW (ref 12.0–16.0)
MCH: 29 pg (ref 26.0–34.0)
MCHC: 33 g/dL (ref 32.0–36.0)
MCV: 87.8 fL (ref 80.0–100.0)
PLATELETS: 352 10*3/uL (ref 150–440)
RBC: 3.41 MIL/uL — ABNORMAL LOW (ref 3.80–5.20)
RDW: 14.1 % (ref 11.5–14.5)
WBC: 8.9 10*3/uL (ref 3.6–11.0)

## 2016-01-22 LAB — GLUCOSE, CAPILLARY
GLUCOSE-CAPILLARY: 110 mg/dL — AB (ref 65–99)
GLUCOSE-CAPILLARY: 139 mg/dL — AB (ref 65–99)

## 2016-01-22 LAB — BASIC METABOLIC PANEL
Anion gap: 6 (ref 5–15)
BUN: 19 mg/dL (ref 6–20)
CO2: 26 mmol/L (ref 22–32)
CREATININE: 1.12 mg/dL — AB (ref 0.44–1.00)
Calcium: 8.7 mg/dL — ABNORMAL LOW (ref 8.9–10.3)
Chloride: 104 mmol/L (ref 101–111)
GFR calc Af Amer: 57 mL/min — ABNORMAL LOW (ref >60)
GFR, EST NON AFRICAN AMERICAN: 49 mL/min — AB (ref >60)
Glucose, Bld: 131 mg/dL — ABNORMAL HIGH (ref 65–99)
POTASSIUM: 4.3 mmol/L (ref 3.5–5.1)
SODIUM: 136 mmol/L (ref 135–145)

## 2016-01-22 LAB — TROPONIN I

## 2016-01-22 LAB — TSH: TSH: 3.402 u[IU]/mL (ref 0.350–4.500)

## 2016-01-22 MED ORDER — INSULIN ASPART 100 UNIT/ML ~~LOC~~ SOLN
0.0000 [IU] | Freq: Three times a day (TID) | SUBCUTANEOUS | Status: DC
  Administered 2016-01-23 (×2): 2 [IU] via SUBCUTANEOUS
  Administered 2016-01-24: 5 [IU] via SUBCUTANEOUS
  Administered 2016-01-24: 3 [IU] via SUBCUTANEOUS
  Administered 2016-01-25: 2 [IU] via SUBCUTANEOUS
  Filled 2016-01-22 (×2): qty 2
  Filled 2016-01-22: qty 3
  Filled 2016-01-22: qty 5
  Filled 2016-01-22: qty 2

## 2016-01-22 MED ORDER — INSULIN ASPART 100 UNIT/ML ~~LOC~~ SOLN
0.0000 [IU] | Freq: Every day | SUBCUTANEOUS | Status: DC
  Administered 2016-01-23: 3 [IU] via SUBCUTANEOUS
  Administered 2016-01-24: 2 [IU] via SUBCUTANEOUS
  Filled 2016-01-22: qty 2
  Filled 2016-01-22: qty 3

## 2016-01-22 MED ORDER — ESCITALOPRAM OXALATE 10 MG PO TABS
10.0000 mg | ORAL_TABLET | Freq: Every day | ORAL | Status: DC
  Administered 2016-01-23 – 2016-01-25 (×2): 10 mg via ORAL
  Filled 2016-01-22 (×2): qty 1

## 2016-01-22 MED ORDER — FAMOTIDINE 20 MG PO TABS
20.0000 mg | ORAL_TABLET | Freq: Every day | ORAL | Status: DC
  Administered 2016-01-23 – 2016-01-25 (×2): 20 mg via ORAL
  Filled 2016-01-22 (×2): qty 1

## 2016-01-22 MED ORDER — DEXTROSE 5 % IV SOLN
5.0000 mg/h | INTRAVENOUS | Status: DC
  Administered 2016-01-22: 15 mg/h via INTRAVENOUS
  Administered 2016-01-22: 5 mg/h via INTRAVENOUS
  Administered 2016-01-22: 10 mg/h via INTRAVENOUS
  Administered 2016-01-22 – 2016-01-23 (×4): 15 mg/h via INTRAVENOUS
  Filled 2016-01-22 (×5): qty 100

## 2016-01-22 MED ORDER — METOPROLOL TARTRATE 25 MG PO TABS
25.0000 mg | ORAL_TABLET | Freq: Four times a day (QID) | ORAL | Status: DC
  Administered 2016-01-22 – 2016-01-24 (×6): 25 mg via ORAL
  Filled 2016-01-22 (×7): qty 1

## 2016-01-22 MED ORDER — SODIUM CHLORIDE 0.9% FLUSH
3.0000 mL | Freq: Two times a day (BID) | INTRAVENOUS | Status: DC
  Administered 2016-01-22 – 2016-01-25 (×5): 3 mL via INTRAVENOUS

## 2016-01-22 MED ORDER — ACETAMINOPHEN 650 MG RE SUPP: 650.0000 mg | Freq: Four times a day (QID) | RECTAL | Status: DC | PRN

## 2016-01-22 MED ORDER — ONDANSETRON HCL 4 MG/2ML IJ SOLN: 4.0000 mg | Freq: Four times a day (QID) | INTRAMUSCULAR | Status: DC | PRN

## 2016-01-22 MED ORDER — INSULIN GLARGINE 100 UNIT/ML ~~LOC~~ SOLN
12.0000 [IU] | Freq: Every day | SUBCUTANEOUS | Status: DC
  Administered 2016-01-22 – 2016-01-24 (×3): 12 [IU] via SUBCUTANEOUS
  Filled 2016-01-22 (×5): qty 0.12

## 2016-01-22 MED ORDER — ACETAMINOPHEN 325 MG PO TABS: 650.0000 mg | ORAL_TABLET | Freq: Four times a day (QID) | ORAL | Status: DC | PRN

## 2016-01-22 MED ORDER — APIXABAN 5 MG PO TABS
5.0000 mg | ORAL_TABLET | Freq: Two times a day (BID) | ORAL | Status: DC
  Administered 2016-01-22 – 2016-01-25 (×6): 5 mg via ORAL
  Filled 2016-01-22 (×6): qty 1

## 2016-01-22 MED ORDER — DILTIAZEM LOAD VIA INFUSION
10.0000 mg | Freq: Once | INTRAVENOUS | Status: AC
  Administered 2016-01-22: 10 mg via INTRAVENOUS
  Filled 2016-01-22: qty 10

## 2016-01-22 MED ORDER — OXYCODONE HCL 5 MG PO TABS: 5.0000 mg | ORAL_TABLET | ORAL | Status: DC | PRN

## 2016-01-22 MED ORDER — ATORVASTATIN CALCIUM 10 MG PO TABS
10.0000 mg | ORAL_TABLET | Freq: Every day | ORAL | Status: DC
  Administered 2016-01-22 – 2016-01-24 (×3): 10 mg via ORAL
  Filled 2016-01-22 (×3): qty 1

## 2016-01-22 MED ORDER — ADULT MULTIVITAMIN W/MINERALS CH
1.0000 | ORAL_TABLET | Freq: Every day | ORAL | Status: DC
  Administered 2016-01-23 – 2016-01-25 (×2): 1 via ORAL
  Filled 2016-01-22 (×2): qty 1

## 2016-01-22 MED ORDER — ONDANSETRON HCL 4 MG PO TABS: 4.0000 mg | ORAL_TABLET | Freq: Four times a day (QID) | ORAL | Status: DC | PRN

## 2016-01-22 MED ORDER — METOPROLOL TARTRATE 25 MG PO TABS: 12.5000 mg | ORAL_TABLET | Freq: Two times a day (BID) | ORAL | Status: DC

## 2016-01-22 NOTE — H&P (Signed)
[facility] NAME: [name]    MR#:  [id number]  DATE OF BIRTH:  11/17/1945   DATE OF ADMISSION:  01/22/2016  PRIMARY CARE PHYSICIAN: Eliezer Lofts, MD   REQUESTING/REFERRING PHYSICIAN: Arida   CHIEF COMPLAINT:  palpitations  HISTORY OF PRESENT ILLNESS:  [name]  is a 70 y.o. female with a known history of paroxsymal atrial fib presenting with 1 week of palpitations. Occurred at rest, associated fatigue, dyspnea on exertion, chest pain - tightness 3/10 retrosternal no worsening or relieving factors. Saw cardiologist noted to be in Afib RVR 150s   PAST MEDICAL HISTORY:   Past Medical History:  Diagnosis Date  . Depressive disorder, not elsewhere classified   . Diabetes mellitus without mention of complication   . Diaphragmatic hernia without mention of obstruction or gangrene   . Disorders of bursae and tendons in shoulder region, unspecified   . Diverticulosis of colon (without mention of hemorrhage)   . Headache(784.0)   . Internal hemorrhoids without mention of complication   . Obesity, unspecified   . Other urinary incontinence   . Paroxysmal atrial fibrillation (HCC)   . Pure hypercholesterolemia     PAST SURGICAL HISTORY:   Past Surgical History:  Procedure Laterality Date  . ABDOMINAL HYSTERECTOMY  1972   Have both ovaries  . BREAST SURGERY     Reduction  . CARDIAC CATHETERIZATION     [place]    . TONSILLECTOMY      SOCIAL HISTORY:   Social History  Substance Use Topics  . Smoking status: Never Smoker  . Smokeless tobacco: Never Used  . Alcohol use No    FAMILY HISTORY:   Family History  Problem Relation Age of Onset  . Cancer Mother     fullopian tube  . Dementia Mother   . Pulmonary embolism Mother   . Hyperlipidemia Mother   . Cancer Father     lung  . Hyperlipidemia Father   . Hypertension Sister   . Diabetes Brother   . Stroke Paternal Grandmother   .  Stroke Paternal Grandfather     DRUG ALLERGIES:   Allergies  Allergen Reactions  . Cortisone   . Pravastatin Sodium     REACTION: leg pain    REVIEW OF SYSTEMS:  REVIEW OF SYSTEMS:  CONSTITUTIONAL: Denies fevers, chills, positive fatigue, weakness.  EYES: Denies blurred vision, double vision, or eye pain.  EARS, NOSE, THROAT: Denies tinnitus, ear pain, hearing loss.  RESPIRATORY: denies cough, shortness of breath, wheezing  CARDIOVASCULAR: positive chest pain, palpitations, denies edema.  GASTROINTESTINAL: Denies nausea, vomiting, diarrhea, abdominal pain.  GENITOURINARY: Denies dysuria, hematuria.  ENDOCRINE: Denies nocturia or thyroid problems. HEMATOLOGIC AND LYMPHATIC: Denies easy bruising or bleeding.  SKIN: Denies rash or lesions.  MUSCULOSKELETAL: Denies pain in neck, back, shoulder, knees, hips, or further arthritic symptoms.  NEUROLOGIC: Denies paralysis, paresthesias.  PSYCHIATRIC: Denies anxiety or depressive symptoms. Otherwise full review of systems performed by me is negative.   MEDICATIONS AT HOME:   Prior to Admission medications   Medication Sig Start Date End Date Taking? Authorizing Provider  apixaban (ELIQUIS) 5 MG TABS tablet Take 1 tablet (5 mg total) by mouth 2 (two) times daily. 01/20/15   [name], MD  atorvastatin (LIPITOR) 10 MG tablet TAKE ONE TABLET BY MOUTH EVERY NIGHT AT BEDTIME 06/30/15   Amy E Diona Browner, MD  Calcium Carb-Ergocalciferol 500-200 MG-UNIT TABS Take by mouth.  02/19/08   Historical Provider, MD  escitalopram (LEXAPRO) 10 MG tablet TAKE ONE TABLET BY MOUTH ONCE DAILY 12/10/15   Amy E Diona Browner, MD  glucose blood (ONE TOUCH ULTRA TEST) test strip CHECK BLOOD SUGAR 2 TIMES A DAY AS DIRECTED 11/24/15   [name], MD  Insulin Glargine (LANTUS SOLOSTAR) 100 UNIT/ML Solostar Pen Inject 12-14 Units into the skin daily at 10 pm. 12/15/15   [name], MD  Insulin Pen Needle (B-D ULTRAFINE III SHORT PEN) 31G X 8 MM MISC Use as  directed Dx:250.00 02/18/12   Amy E Diona Browner, MD  linagliptin (TRADJENTA) 5 MG TABS tablet Take 1 tablet (5 mg total) by mouth daily. 11/24/15   [name], MD  metFORMIN (GLUCOPHAGE-XR) 500 MG 24 hr tablet Take 4 tablets (2,000 mg total) by mouth daily with supper. 11/24/15   [name], MD  metoprolol tartrate (LOPRESSOR) 25 MG tablet Take 0.5 tablets (12.5 mg total) by mouth 2 (two) times daily. 01/20/15   [name], MD  Multiple Vitamin (MULTIVITAMIN) tablet Take 1 tablet by mouth daily.      Historical Provider, MD  ranitidine (ZANTAC) 150 MG tablet Take 150 mg by mouth daily.      Historical Provider, MD      VITAL SIGNS:  There were no vitals taken for this visit.  PHYSICAL EXAMINATION:  VITAL SIGNS:There were no vitals filed for this visit. GENERAL:70 y.o.female currently in no acute distress.  HEAD: Normocephalic, atraumatic.  EYES: Pupils equal, round, reactive to light. Extraocular muscles intact. No scleral icterus.  MOUTH: Moist mucosal membrane. Dentition intact. No abscess noted.  EAR, NOSE, THROAT: Clear without exudates. No external lesions.  NECK: Supple. No thyromegaly. No nodules. No JVD.  PULMONARY: Clear to ascultation, without wheeze rails or rhonci. No use of accessory muscles, Good respiratory effort. good air entry bilaterally CHEST: Nontender to palpation.  CARDIOVASCULAR: S1 and S2. Irregular rate and rhythm. No murmurs, rubs, or gallops. No edema. Pedal pulses 2+ bilaterally.  GASTROINTESTINAL: Soft, nontender, nondistended. No masses. Positive bowel sounds. No hepatosplenomegaly.  MUSCULOSKELETAL: No swelling, clubbing, or edema. Range of motion full in all extremities.  NEUROLOGIC: Cranial nerves II through XII are intact. No gross focal neurological deficits. Sensation intact. Reflexes intact.  SKIN: No ulceration, lesions, rashes, or cyanosis. Skin warm and dry. Turgor intact.  PSYCHIATRIC: Mood, affect within normal limits. The patient is  awake, alert and oriented x 3. Insight, judgment intact.    LABORATORY PANEL:   CBC No results for input(s): WBC, HGB, HCT, PLT in the last 168 hours. ------------------------------------------------------------------------------------------------------------------  Chemistries  No results for input(s): NA, K, CL, CO2, GLUCOSE, BUN, CREATININE, CALCIUM, MG, AST, ALT, ALKPHOS, BILITOT in the last 168 hours.  Invalid input(s): GFRCGP ------------------------------------------------------------------------------------------------------------------  Cardiac Enzymes No results for input(s): TROPONINI in the last 168 hours. ------------------------------------------------------------------------------------------------------------------  RADIOLOGY:  No results found.  EKG:   Orders placed or performed in visit on 01/22/16  . EKG 12-Lead    IMPRESSION AND PLAN:   70 year old female history of paroxsymal atrial fib presenting with palpitations and chest pain  1. afib rvr: TTE 2015: EF 65, LA 40mm: continue anticoagulation, check echo, rate control - cardizem bolus with drip, troponins, cardio consult, suspect further LA dilation which will make it difficult to maintain NSR  2. Hyperlipidemia unspecified: statin 3.Type 2 diabetes insulin requiring: hold oral agents, continuie sliding and basal     All the records are reviewed and case discussed with ED provider. Management  plans discussed with the patient, family and they are in agreement.  CODE STATUS: full  TOTAL TIME TAKING CARE OF THIS PATIENT: 44 critical minutes.    Hower,  Karenann Cai.D on 01/22/2016 at 2:53 PM  Between 7am to 6pm - Pager - [phone]  After 6pm: House Pager: - [facility]  Office  [phone]  CC: Primary care physician; Eliezer Lofts, MD

## 2016-01-22 NOTE — Progress Notes (Signed)
Cardiology Office Note   Date:  01/22/2016   ID:  [name], DOB [birth year], MRN [id number]  PCP:  Eliezer Lofts, MD  Cardiologist:   Kathlyn Sacramento, MD   Chief Complaint  Patient presents with  . other    12 month follow up. Meds reviewed by the pt. verbally. Pt. c/o rapid heart beats, chest discomfort, difficulty breathing and elevated BP.        History of Present Illness: [name] is a 70 y.o. female who presents for a followup visit regarding paroxysmal atrial fibrillation and mitral valve prolapse.  She had previous cardiac catheterization in October of 2010 for atypical chest pain. It showed no significant coronary artery disease with mildly elevated left ventricular end-diastolic pressure and normal ejection fraction. She has known history of diabetes and hyperlipidemia. She was seen in July of 2015 for newly diagnosed atrial fibrillation. She converted to sinus rhythm on small dose metoprolol. She was started on anticoagulation with Eliquis. 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  Nuclear stress test in July 2015 was normal. She had intermittent palpitations in June but did not last long. However, over the last 1 week, she has experienced intermittent palpitations and tachycardia. This has worsened significantly over the last few days. She had severe epigastric discomfort and shortness of breath this morning. No significant chest discomfort. She has been taking metoprolol 12.5 mg twice daily and she is on long-term anticoagulation with Eliquis.    Past Medical History:  Diagnosis Date  . Depressive disorder, not elsewhere classified   . Diabetes mellitus without mention of complication   . Diaphragmatic hernia without mention of obstruction or gangrene   . Disorders of bursae and tendons in shoulder region, unspecified   . Diverticulosis of colon  (without mention of hemorrhage)   . Headache(784.0)   . Internal hemorrhoids without mention of complication   . Obesity, unspecified   . Other urinary incontinence   . Paroxysmal atrial fibrillation (HCC)   . Pure hypercholesterolemia     Past Surgical History:  Procedure Laterality Date  . ABDOMINAL HYSTERECTOMY  1972   Have both ovaries  . BREAST SURGERY     Reduction  . CARDIAC CATHETERIZATION     [place]    . TONSILLECTOMY       Current Outpatient Prescriptions  Medication Sig Dispense Refill  . apixaban (ELIQUIS) 5 MG TABS tablet Take 1 tablet (5 mg total) by mouth 2 (two) times daily. 60 tablet 11  . atorvastatin (LIPITOR) 10 MG tablet TAKE ONE TABLET BY MOUTH EVERY NIGHT AT BEDTIME 30 tablet 11  . Calcium Carb-Ergocalciferol 500-200 MG-UNIT TABS Take by mouth.    . escitalopram (LEXAPRO) 10 MG tablet TAKE ONE TABLET BY MOUTH ONCE DAILY 90 tablet 1  . glucose blood (ONE TOUCH ULTRA TEST) test strip CHECK BLOOD SUGAR 2 TIMES A DAY AS DIRECTED 200 each 3  . Insulin Glargine (LANTUS SOLOSTAR) 100 UNIT/ML Solostar Pen Inject 12-14 Units into the skin daily at 10 pm. 30 mL 2  . Insulin Pen Needle (B-D ULTRAFINE III SHORT PEN) 31G X 8 MM MISC Use as directed Dx:250.00 100 each 6  . linagliptin (TRADJENTA) 5 MG TABS tablet Take 1 tablet (5 mg total) by mouth daily. 30 tablet 11  . metFORMIN (GLUCOPHAGE-XR) 500 MG 24 hr tablet Take 4 tablets (2,000 mg total) by mouth daily with  supper. 360 tablet 3  . metoprolol tartrate (LOPRESSOR) 25 MG tablet Take 0.5 tablets (12.5 mg total) by mouth 2 (two) times daily. 90 tablet 3  . Multiple Vitamin (MULTIVITAMIN) tablet Take 1 tablet by mouth daily.      . ranitidine (ZANTAC) 150 MG tablet Take 150 mg by mouth daily.       No current facility-administered medications for this visit.     Allergies:   Cortisone and Pravastatin sodium    Social History:  The patient  reports that she has never smoked. She has never used  smokeless tobacco. She reports that she does not drink alcohol or use drugs.   Family History:  The patient's family history includes Cancer in her father and mother; Dementia in her mother; Diabetes in her brother; Hyperlipidemia in her father and mother; Hypertension in her sister; Pulmonary embolism in her mother; Stroke in her paternal grandfather and paternal grandmother.    ROS:  Please see the history of present illness.   Otherwise, review of systems are positive for none.   All other systems are reviewed and negative.    PHYSICAL EXAM: VS:  BP 116/72 (BP Location: Left Arm, Patient Position: Supine, Cuff Size: Normal)   Pulse (!) 150   Ht 5' 5.5" (1.664 m)   Wt 204 lb (92.5 kg)   LMP  (LMP Unknown)   BMI 33.43 kg/m  , BMI Body mass index is 33.43 kg/m. GEN: Well nourished, well developed, in no acute distress  HEENT: normal  Neck: no JVD, carotid bruits, or masses Cardiac: Irregularly irregular and tachycardic; no murmurs, rubs, or gallops,no edema  Respiratory:  clear to auscultation bilaterally, normal work of breathing GI: soft, nontender, nondistended, + BS MS: no deformity or atrophy  Skin: warm and dry, no rash Neuro:  Strength and sensation are intact Psych: euthymic mood, full affect   EKG:  EKG is ordered today. The ekg ordered today demonstrates atrial fibrillation with rapid ventricular response. Heart rate is 150 bpm.   Recent Labs: 05/09/2015: ALT 15; BUN 19; Creatinine, Ser 0.89; Potassium 4.5; Sodium 140    Lipid Panel    Component Value Date/Time   CHOL 145 05/09/2015 0937   TRIG 73.0 05/09/2015 0937   HDL 44.50 05/09/2015 0937   CHOLHDL 3 05/09/2015 0937   VLDL 14.6 05/09/2015 0937   LDLCALC 86 05/09/2015 0937   LDLDIRECT 163.1 07/04/2011 0922      Wt Readings from Last 3 Encounters:  01/22/16 204 lb (92.5 kg)  11/24/15 193 lb (87.5 kg)  08/24/15 198 lb (89.8 kg)       No flowsheet data found.    ASSESSMENT AND PLAN:  1.   Atrial  fibrillation with rapid ventricular response: The patient is symptomatic and has significant palpitations and dyspnea which is worrisome for early CHF given that she had intermittent tachycardia over the last week. Her heart rate right now is 150 bpm. Due to that, I recommend hospital admission to initiate treatment with diltiazem drip for rate control. Given the reported shortness of breath and epigastric discomfort this morning, recommend serial troponin and BNP. Increase the dose of oral metoprolol to 25 mg twice daily. Up titration is limited by relatively low blood pressure. We might consider class IC antiarrhythmic medication such as flecainide to try to maintain sinus rhythm. Continue anticoagulation with Eliquis.   2. Mitral valve prolapse with moderate regurgitation: I recommend repeat echocardiogram once she converts to sinus rhythm or once ventricular rate is  controlled.  3. Hyperlipidemia:Currently on atorvastatin.  4. Diabetes mellitus: Managed by primary care.    Disposition:   Admit to telemetry. I gave a report to Dr. Jacqulyn Ducking.   Signed,  Kathlyn Sacramento, MD  01/22/2016 1:23 PM    [place]

## 2016-01-23 ENCOUNTER — Inpatient Hospital Stay: Payer: 59

## 2016-01-23 DIAGNOSIS — Z794 Long term (current) use of insulin: Secondary | ICD-10-CM

## 2016-01-23 DIAGNOSIS — E1159 Type 2 diabetes mellitus with other circulatory complications: Secondary | ICD-10-CM

## 2016-01-23 DIAGNOSIS — D509 Iron deficiency anemia, unspecified: Secondary | ICD-10-CM

## 2016-01-23 DIAGNOSIS — R062 Wheezing: Secondary | ICD-10-CM

## 2016-01-23 DIAGNOSIS — E1165 Type 2 diabetes mellitus with hyperglycemia: Secondary | ICD-10-CM

## 2016-01-23 DIAGNOSIS — R0602 Shortness of breath: Secondary | ICD-10-CM

## 2016-01-23 DIAGNOSIS — I152 Hypertension secondary to endocrine disorders: Secondary | ICD-10-CM

## 2016-01-23 DIAGNOSIS — D649 Anemia, unspecified: Secondary | ICD-10-CM

## 2016-01-23 DIAGNOSIS — I10 Essential (primary) hypertension: Secondary | ICD-10-CM

## 2016-01-23 LAB — CBC
HCT: 29.5 % — ABNORMAL LOW (ref 35.0–47.0)
HEMOGLOBIN: 9.8 g/dL — AB (ref 12.0–16.0)
MCH: 28.7 pg (ref 26.0–34.0)
MCHC: 33.1 g/dL (ref 32.0–36.0)
MCV: 86.6 fL (ref 80.0–100.0)
PLATELETS: 351 10*3/uL (ref 150–440)
RBC: 3.4 MIL/uL — AB (ref 3.80–5.20)
RDW: 14.5 % (ref 11.5–14.5)
WBC: 8.1 10*3/uL (ref 3.6–11.0)

## 2016-01-23 LAB — GLUCOSE, CAPILLARY
GLUCOSE-CAPILLARY: 131 mg/dL — AB (ref 65–99)
GLUCOSE-CAPILLARY: 185 mg/dL — AB (ref 65–99)
GLUCOSE-CAPILLARY: 166 mg/dL — AB (ref 65–99)
GLUCOSE-CAPILLARY: 264 mg/dL — AB (ref 65–99)

## 2016-01-23 LAB — ECHOCARDIOGRAM COMPLETE
Height: 65 in
Weight: 3272.68 oz

## 2016-01-23 LAB — TROPONIN I

## 2016-01-23 LAB — BRAIN NATRIURETIC PEPTIDE: B NATRIURETIC PEPTIDE 5: 258 pg/mL — AB (ref 0.0–100.0)

## 2016-01-23 MED ORDER — FLECAINIDE ACETATE 50 MG PO TABS
50.0000 mg | ORAL_TABLET | Freq: Two times a day (BID) | ORAL | Status: DC
  Administered 2016-01-23 – 2016-01-25 (×5): 50 mg via ORAL
  Filled 2016-01-23 (×5): qty 1

## 2016-01-23 MED ORDER — FUROSEMIDE 10 MG/ML IJ SOLN
20.0000 mg | Freq: Once | INTRAMUSCULAR | Status: AC
  Administered 2016-01-23: 20 mg via INTRAVENOUS
  Filled 2016-01-23: qty 2

## 2016-01-23 MED ORDER — DILTIAZEM HCL 60 MG PO TABS
60.0000 mg | ORAL_TABLET | Freq: Four times a day (QID) | ORAL | Status: DC
  Administered 2016-01-23 – 2016-01-24 (×2): 60 mg via ORAL
  Filled 2016-01-23 (×2): qty 1

## 2016-01-23 MED ORDER — LEVALBUTEROL HCL 0.63 MG/3ML IN NEBU
0.6300 mg | INHALATION_SOLUTION | Freq: Four times a day (QID) | RESPIRATORY_TRACT | Status: DC
  Administered 2016-01-23 – 2016-01-25 (×8): 0.63 mg via RESPIRATORY_TRACT
  Filled 2016-01-23 (×8): qty 3

## 2016-01-23 NOTE — Progress Notes (Signed)
[facility] [place] NAME: Madora Tamaki    MR#:  [id number]  DATE OF BIRTH:  [birth year]  SUBJECTIVE:  CHIEF COMPLAINT:  Patient denies any chest pain shortness of breath or palpitations during my examination. Resting comfortably  REVIEW OF SYSTEMS:  CONSTITUTIONAL: No fever, fatigue or weakness.  EYES: No blurred or double vision.  EARS, NOSE, AND THROAT: No tinnitus or ear pain.  RESPIRATORY: No cough, shortness of breath, wheezing or hemoptysis.  CARDIOVASCULAR: No chest pain, orthopnea, edema.  GASTROINTESTINAL: No nausea, vomiting, diarrhea or abdominal pain.  GENITOURINARY: No dysuria, hematuria.  ENDOCRINE: No polyuria, nocturia,  HEMATOLOGY: No anemia, easy bruising or bleeding SKIN: No rash or lesion. MUSCULOSKELETAL: No joint pain or arthritis.   NEUROLOGIC: No tingling, numbness, weakness.  PSYCHIATRY: No anxiety or depression.   DRUG ALLERGIES:   Allergies  Allergen Reactions  . Cortisone   . Pravastatin Sodium     REACTION: leg pain    VITALS:  Blood pressure 113/77, pulse (!) 117, temperature 97.9 F (36.6 C), temperature source Oral, resp. rate 16, height 5\' 5"  (1.651 m), weight 92.8 kg (204 lb 8.7 oz), SpO2 98 %.  PHYSICAL EXAMINATION:  GENERAL:  70 y.o.-year-old patient lying in the bed with no acute distress.  EYES: Pupils equal, round, reactive to light and accommodation. No scleral icterus. Extraocular muscles intact.  HEENT: Head atraumatic, normocephalic. Oropharynx and nasopharynx clear.  NECK:  Supple, no jugular venous distention. No thyroid enlargement, no tenderness.  LUNGS: Normal breath sounds bilaterally, no wheezing, rales,rhonchi or crepitation. No use of accessory muscles of respiration.  CARDIOVASCULAR: Irregularly irregular. No murmurs, rubs, or gallops.  ABDOMEN: Soft, nontender, nondistended. Bowel sounds present. No organomegaly or mass.  EXTREMITIES: No pedal edema, cyanosis, or  clubbing.  NEUROLOGIC: Cranial nerves II through XII are intact. Muscle strength 5/5 in all extremities. Sensation intact. Gait not checked.  PSYCHIATRIC: The patient is alert and oriented x 3.  SKIN: No obvious rash, lesion, or ulcer.    LABORATORY PANEL:   CBC  Recent Labs Lab 01/23/16 0903  WBC 8.1  HGB 9.8*  HCT 29.5*  PLT 351   ------------------------------------------------------------------------------------------------------------------  Chemistries   Recent Labs Lab 01/22/16 1450  NA 136  K 4.3  CL 104  CO2 26  GLUCOSE 131*  BUN 19  CREATININE 1.12*  CALCIUM 8.7*   ------------------------------------------------------------------------------------------------------------------  Cardiac Enzymes  Recent Labs Lab 01/23/16 0225  TROPONINI <0.03   ------------------------------------------------------------------------------------------------------------------  RADIOLOGY:  Dg Chest Port 1 View  Result Date: 01/23/2016 CLINICAL DATA:  Shortness of breath and wheezing EXAM: PORTABLE CHEST 1 VIEW COMPARISON:  None. FINDINGS: There is mild atelectatic change in lung bases. Lungs elsewhere are clear. Heart is mildly enlarged with pulmonary vascularity within normal limits. No adenopathy. No bone lesions. IMPRESSION: Mild bibasilar atelectasis. No edema or consolidation. Mild cardiac enlargement. Electronically Signed   By: Lowella Grip III M.D.   On: 01/23/2016 08:46    EKG:   Orders placed or performed in visit on 01/22/16  . EKG 12-Lead    ASSESSMENT AND PLAN:   70 year old female history of paroxsymal atrial fib presenting with palpitations and chest pain  1. Paroxysmal afib rvr: TTE 2015: EF 65, LA 9mm:  Continue Cardizem infusion and plan is to transition patient to by mouth Cardizem  after flecainide continue anticoagulation,  check echo Appreciate cardiology recommendations [facility] Continue Lopressor 25 mg every 6  hours Chest  x-ray with no edema   2. Hyperlipidemia unspecified: statin  3.Type 2 diabetes insulin requiring: hold oral agents, continuie sliding and basal      All the records are reviewed and case discussed with Care Management/Social Workerr. Management plans discussed with the patient, family and they are in agreement.  CODE STATUS: fc   TOTAL TIME TAKING CARE OF THIS PATIENT: 33 minutes.   POSSIBLE D/C IN 1-2DAYS, DEPENDING ON CLINICAL CONDITION.  Note: This dictation was prepared with Dragon dictation along with smaller phrase technology. Any transcriptional errors that result from this process are unintentional.   Nicholes Mango M.D on 01/23/2016 at 5:14 PM  Between 7am to 6pm - Pager - [phone] After 6pm go to [email or web address] - password [email or web address] [facility]  [facility]  Office  [phone]  CC: Primary care physician; Eliezer Lofts, MD

## 2016-01-23 NOTE — Care Management (Signed)
Direct admission for atrial fib with rvr 150s. Currently on cardizem continuous infusion with continued issues with rate control. She is on chronic Eliquis. Upward titration is limited due to low blood pressure.  Today flecainide was added to the plan of treatment.  Prior to this episode of illness, Independent in all adls, denies issues accessing medical care, obtaining medications or with transportation.  Current with her PCP and cardiologist.  No discharge needs identified at present by care manager or members of care team

## 2016-01-23 NOTE — Consult Note (Signed)
Cardiology Consultation Note  Patient ID: [name], MRN: [id number], DOB/AGE: 1946-02-16 70 y.o. Admit date: 01/22/2016   Date of Consult: 01/23/2016 Primary Physician: Eliezer Lofts, MD Primary Cardiologist: Dr. Fletcher Anon, MD Requesting Physician: Dr. Lavetta Nielsen, MD  Chief Complaint: Palpitations/SOB Reason for Consult: PAF with RVR  HPI: 70 y.o. female with h/o PAF on Eliquis, non-significant CAD by cardiac cath in 2010, MVP, DM, and HLD who was seen in the office on 11/20 with increased SOB and palpitations x 1 week and was found to be in Afib with RVR with heart rates in the 150's bpm. She was directly admitted to [facility].   Previous cardiac cath in October 2010 for atypical chest pain showed no significant CAD with mildly elevated LVEDP and normal EF. She was seen in July 2015 for newly diagnosed Afib and converted to NSR with small-dose metoprolol. She was started on Eliquis at that time. Echo showed normal LV systolic function and mild anterior mitral leaflet prolapse with moderate regurgitation and normal atrial size. Nuclear stress test in July 2015 was normal.   She presented for her regularly scheduled follow up with Dr. Fletcher Anon on 11/20 with a 1 week history of palpitations and increased SOB, worse the last couple of days. She was noted to be in Afib with RVR with heart rates into the 150's bpm and was quite symptomatic. She has never missed a dose of Eliquis. She was directly admitted to [facility] and started on Cardizem gtt and metoprolol was increased to 25 mg q 6 hours. Heart rate has improved to the upper 90's bpm at rest and will increase to the 120's bpm with minimal ambulation. She is asymptomatic when at rest, though symptomatic with SOB and palpitations with ambulation. Troponin negative x 3, BNP pending, TSH normal, WBC 11.1, HGB 9.9 (previously 12.4 on 09/24/2013), SCr 1.12, K+ 4.3. EKG with Afib with RVR, 151 bpm, nonspecific st/t changes. This morning she notes some wheezing. No history  of asthma and never a smoker.    Past Medical History:  Diagnosis Date  . Depressive disorder, not elsewhere classified   . Diabetes mellitus without mention of complication   . Diaphragmatic hernia without mention of obstruction or gangrene   . Disorders of bursae and tendons in shoulder region, unspecified   . Diverticulosis of colon (without mention of hemorrhage)   . Headache(784.0)   . Internal hemorrhoids without mention of complication   . Obesity, unspecified   . Other urinary incontinence   . Paroxysmal atrial fibrillation (HCC)   . Pure hypercholesterolemia       Most Recent Cardiac Studies: As above   Surgical History:  Past Surgical History:  Procedure Laterality Date  . ABDOMINAL HYSTERECTOMY  1972   Have both ovaries  . BREAST SURGERY     Reduction  . CARDIAC CATHETERIZATION     [place]    . TONSILLECTOMY       Home Meds: Prior to Admission medications   Medication Sig Start Date End Date Taking? Authorizing Provider  apixaban (ELIQUIS) 5 MG TABS tablet Take 1 tablet (5 mg total) by mouth 2 (two) times daily. 01/20/15   [name], MD  atorvastatin (LIPITOR) 10 MG tablet TAKE ONE TABLET BY MOUTH EVERY NIGHT AT BEDTIME 06/30/15   Amy E Diona Browner, MD  Calcium Carb-Ergocalciferol 500-200 MG-UNIT TABS Take by mouth. 02/19/08   Historical Provider, MD  escitalopram (LEXAPRO) 10 MG tablet TAKE ONE TABLET BY MOUTH ONCE DAILY 12/10/15  Amy E Diona Browner, MD  glucose blood (ONE TOUCH ULTRA TEST) test strip CHECK BLOOD SUGAR 2 TIMES A DAY AS DIRECTED 11/24/15   [name], MD  Insulin Glargine (LANTUS SOLOSTAR) 100 UNIT/ML Solostar Pen Inject 12-14 Units into the skin daily at 10 pm. 12/15/15   [name], MD  Insulin Pen Needle (B-D ULTRAFINE III SHORT PEN) 31G X 8 MM MISC Use as directed Dx:250.00 02/18/12   Amy E Diona Browner, MD  linagliptin (TRADJENTA) 5 MG TABS tablet Take 1 tablet (5 mg total) by mouth daily. 11/24/15   [name], MD    metFORMIN (GLUCOPHAGE-XR) 500 MG 24 hr tablet Take 4 tablets (2,000 mg total) by mouth daily with supper. 11/24/15   [name], MD  metoprolol tartrate (LOPRESSOR) 25 MG tablet Take 0.5 tablets (12.5 mg total) by mouth 2 (two) times daily. 01/20/15   [name], MD  Multiple Vitamin (MULTIVITAMIN) tablet Take 1 tablet by mouth daily.      Historical Provider, MD  ranitidine (ZANTAC) 150 MG tablet Take 150 mg by mouth daily.      Historical Provider, MD    Inpatient Medications:  . apixaban  5 mg Oral BID  . atorvastatin  10 mg Oral QHS  . escitalopram  10 mg Oral Daily  . famotidine  20 mg Oral Daily  . insulin aspart  0-5 Units Subcutaneous QHS  . insulin aspart  0-9 Units Subcutaneous TID WC  . insulin glargine  12 Units Subcutaneous Q2200  . metoprolol tartrate  25 mg Oral Q6H  . multivitamin with minerals  1 tablet Oral Daily  . sodium chloride flush  3 mL Intravenous Q12H   . diltiazem (CARDIZEM) infusion 15 mg/hr (01/23/16 0154)    Allergies:  Allergies  Allergen Reactions  . Cortisone   . Pravastatin Sodium     REACTION: leg pain    Social History   Social History  . Marital status: Married    Spouse name: N/A  . Number of children: 2  . Years of education: N/A   Occupational History  . 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 Smoking status: Never Smoker  . Smokeless tobacco: Never Used  . Alcohol use No  . Drug use: No  . Sexual activity: Not on file   Other Topics Concern  . Not on file   Social History Narrative   Exercise walks 3 times daily         Diet fruit, occ fast food, salads and lean meat     Family History  Problem Relation Age of Onset  . Cancer Mother     fullopian tube  . Dementia Mother   . Pulmonary embolism Mother   . Hyperlipidemia Mother   . Cancer Father     lung  . Hyperlipidemia Father   . Hypertension Sister   . Diabetes Brother   . Stroke Paternal Grandmother   . Stroke  Paternal Grandfather      Review of Systems: Review of Systems  Constitutional: Positive for malaise/fatigue. Negative for chills, diaphoresis, fever and weight loss.  HENT: Negative for congestion.   Eyes: Negative for discharge and redness.  Respiratory: Positive for shortness of breath and wheezing. Negative for cough, hemoptysis and sputum production.   Cardiovascular: Positive for palpitations. Negative for chest pain, orthopnea, claudication, leg swelling and PND.  Gastrointestinal: Negative for abdominal pain, blood in stool, heartburn, melena, nausea and vomiting.  Genitourinary: Negative for hematuria.  Musculoskeletal: Negative for falls and myalgias.  Skin: Negative for rash.  Neurological: Positive for weakness. Negative for dizziness, tingling, tremors, sensory change, speech change, focal weakness and loss of consciousness.  Endo/Heme/Allergies: Does not bruise/bleed easily.  Psychiatric/Behavioral: Negative for substance abuse. The patient is not nervous/anxious.   All other systems reviewed and are negative.   Labs:  Recent Labs  01/22/16 1450 01/22/16 2018 01/23/16 0225  TROPONINI <0.03 <0.03 <0.03   Lab Results  Component Value Date   WBC 8.9 01/22/2016   HGB 9.9 (L) 01/22/2016   HCT 29.9 (L) 01/22/2016   MCV 87.8 01/22/2016   PLT 352 01/22/2016    Recent Labs Lab 01/22/16 1450  NA 136  K 4.3  CL 104  CO2 26  BUN 19  CREATININE 1.12*  CALCIUM 8.7*  GLUCOSE 131*   Lab Results  Component Value Date   CHOL 145 05/09/2015   HDL 44.50 05/09/2015   LDLCALC 86 05/09/2015   TRIG 73.0 05/09/2015   Lab Results  Component Value Date   DDIMER  12/13/2008    0.36        AT THE INHOUSE ESTABLISHED CUTOFF VALUE OF 0.48 ug/mL FEU, THIS ASSAY HAS BEEN DOCUMENTED IN THE LITERATURE TO HAVE A SENSITIVITY AND NEGATIVE PREDICTIVE VALUE OF AT LEAST 98 TO 99%.  THE TEST RESULT SHOULD BE CORRELATED WITH AN ASSESSMENT OF THE CLINICAL PROBABILITY OF DVT /  VTE.    Radiology/Studies:  No results found.  EKG: Interpreted by me showed: Afib with RVR, 151 bpm, nonspecific st/t changes  Telemetry: Interpreted by me showed: Afib with heart rates ranging from the 90's to 120's bpm  Weights: Filed Weights   01/22/16 1626  Weight: 204 lb 8.7 oz (92.8 kg)     Physical Exam: Blood pressure 91/71, pulse 83, temperature 98 F (36.7 C), temperature source Oral, resp. rate 16, height 5\' 5"  (1.651 m), weight 204 lb 8.7 oz (92.8 kg), SpO2 97 %. Body mass index is 34.04 kg/m. General: Well developed, well nourished, in no acute distress. Head: Normocephalic, atraumatic, sclera non-icteric, no xanthomas, nares are without discharge.  Neck: Negative for carotid bruits. JVD not elevated. Lungs: Bilateral expiratory wheezing from the mid lung up. Breathing is unlabored. Heart: Irregularly irregular with S1 S2. No murmurs, rubs, or gallops appreciated. Abdomen: Soft, non-tender, non-distended with normoactive bowel sounds. No hepatomegaly. No rebound/guarding. No obvious abdominal masses. Msk:  Strength and tone appear normal for age. Extremities: No clubbing or cyanosis. No edema. Distal pedal pulses are 2+ and equal bilaterally. Neuro: Alert and oriented X 3. No facial asymmetry. No focal deficit. Moves all extremities spontaneously. Psych:  Responds to questions appropriately with a normal affect.    Assessment and Plan:  Active Problems:   Rapid atrial fibrillation (HCC)    1. PAF with RVR; -Rate modestly controlled at rest in the upper 90's bpm, with ambulation heart rate will increase to the 120's bpm and she becomes SOB with palpitations -BP precludes further titration of rate-control medications -Add flecainide 50 mg bid (recent negative ischemic evaluation as above) -Echo pending at this time -Continue Eliquis 5 mg bid (has never missed a dose, takes bid) -CHADS2VASc at least 3 (age x 1, DM, female) -Given tachycardic rates with  ambulation will continue Cardizem gtt this morning with hopes of transitioning to PO diltiazem this afternoon after she has received flecainide -No history of asthma or smoking, cannot rule out increased dose of metoprolol possibly playing a role in her wheezing,  for now continue Lopressor 25 mg q 6 hours for rate control  2. Wheezing: -Add Xopenex nebs -Check CXR  3. SOB: -As above -Cannot rule out some degree of worsening anemia given hgb of 9.9 this admission (prior 12 in 2015) -Needs further evaluation per IM -BNP pending  4. MVP: -Echo pending as above  5. HLD: -Lipitor  6. DM: -Per IM   Signed, Christell Faith, PA-C [facility] Pager: [phone] 01/23/2016, 7:59 AM

## 2016-01-23 NOTE — Progress Notes (Signed)
Pt due to get 25mg  PO metoprolol, however pt's blood pressure is as follows .Marland KitchenMarland KitchenMD paged, Dr. Ara Kussmaul states to hold this dose. Will continue to monitor. Conley Simmonds, RN, BSN  Vitals:   01/23/16 2337 01/23/16 2340  BP: 95/76 96/68  Pulse: 69 60  Resp:    Temp:

## 2016-01-24 ENCOUNTER — Encounter
Admission: AD | Disposition: A | Discharge status: Home / Self Care | Payer: Self-pay | Source: Ambulatory Visit | Attending: Internal Medicine

## 2016-01-24 ENCOUNTER — Inpatient Hospital Stay: Payer: 59 | Admitting: Anesthesiology

## 2016-01-24 ENCOUNTER — Encounter: Payer: Self-pay | Admitting: Cardiovascular Disease

## 2016-01-24 HISTORY — PX: ELECTROPHYSIOLOGIC STUDY: SHX172A

## 2016-01-24 LAB — CBC
HCT: 29.5 % — ABNORMAL LOW (ref 35.0–47.0)
Hemoglobin: 9.8 g/dL — ABNORMAL LOW (ref 12.0–16.0)
MCH: 28.3 pg (ref 26.0–34.0)
MCHC: 33 g/dL (ref 32.0–36.0)
MCV: 85.6 fL (ref 80.0–100.0)
PLATELETS: 349 10*3/uL (ref 150–440)
RBC: 3.45 MIL/uL — ABNORMAL LOW (ref 3.80–5.20)
RDW: 14.3 % (ref 11.5–14.5)
WBC: 7.9 10*3/uL (ref 3.6–11.0)

## 2016-01-24 LAB — BASIC METABOLIC PANEL
Anion gap: 8 (ref 5–15)
BUN: 15 mg/dL (ref 6–20)
CALCIUM: 8.8 mg/dL — AB (ref 8.9–10.3)
CO2: 28 mmol/L (ref 22–32)
CREATININE: 0.92 mg/dL (ref 0.44–1.00)
Chloride: 102 mmol/L (ref 101–111)
GFR calc Af Amer: >60 mL/min (ref >60)
GLUCOSE: 165 mg/dL — AB (ref 65–99)
Potassium: 3.9 mmol/L (ref 3.5–5.1)
SODIUM: 138 mmol/L (ref 135–145)

## 2016-01-24 LAB — GLUCOSE, CAPILLARY
GLUCOSE-CAPILLARY: 228 mg/dL — AB (ref 65–99)
GLUCOSE-CAPILLARY: 227 mg/dL — AB (ref 65–99)
Glucose-Capillary: 151 mg/dL — ABNORMAL HIGH (ref 65–99)
Glucose-Capillary: 289 mg/dL — ABNORMAL HIGH (ref 65–99)

## 2016-01-24 LAB — BRAIN NATRIURETIC PEPTIDE: B Natriuretic Peptide: 181 pg/mL — ABNORMAL HIGH (ref 0.0–100.0)

## 2016-01-24 SURGERY — CARDIOVERSION (CATH LAB)
Anesthesia: General

## 2016-01-24 MED ORDER — DIGOXIN 0.25 MG/ML IJ SOLN
0.5000 mg | Freq: Once | INTRAMUSCULAR | Status: AC
  Administered 2016-01-24: 0.5 mg via INTRAVENOUS
  Filled 2016-01-24: qty 2

## 2016-01-24 MED ORDER — PROPOFOL 10 MG/ML IV BOLUS
INTRAVENOUS | Status: DC | PRN
  Administered 2016-01-24: 70 mg via INTRAVENOUS

## 2016-01-24 MED ORDER — FUROSEMIDE 10 MG/ML IJ SOLN
20.0000 mg | Freq: Once | INTRAMUSCULAR | Status: AC
  Administered 2016-01-24: 20 mg via INTRAVENOUS
  Filled 2016-01-24: qty 2

## 2016-01-24 MED ORDER — SODIUM CHLORIDE 0.9 % IV SOLN
INTRAVENOUS | Status: DC | PRN
  Administered 2016-01-24: 09:00:00 via INTRAVENOUS

## 2016-01-24 MED ORDER — FLECAINIDE ACETATE 50 MG PO TABS
50.0000 mg | ORAL_TABLET | Freq: Once | ORAL | Status: AC
  Administered 2016-01-24: 50 mg via ORAL
  Filled 2016-01-24: qty 1

## 2016-01-24 MED ORDER — DILTIAZEM HCL 30 MG PO TABS
30.0000 mg | ORAL_TABLET | Freq: Four times a day (QID) | ORAL | Status: DC
  Administered 2016-01-24 – 2016-01-25 (×5): 30 mg via ORAL
  Filled 2016-01-24 (×5): qty 1

## 2016-01-24 NOTE — Anesthesia Postprocedure Evaluation (Signed)
Anesthesia Post Note  Patient: [name]  Procedure(s) Performed: Procedure(s) (LRB): CARDIOVERSION (N/A)  Patient location during evaluation: PACU Anesthesia Type: General Level of consciousness: awake and alert and oriented Pain management: pain level controlled Vital Signs Assessment: post-procedure vital signs reviewed and stable Respiratory status: spontaneous breathing, nonlabored ventilation and respiratory function stable Cardiovascular status: blood pressure returned to baseline and stable Postop Assessment: no signs of nausea or vomiting Anesthetic complications: no    Last Vitals:  Vitals:   01/24/16 1000 01/24/16 1018  BP: 115/63 (!) 118/59  Pulse: 63 99  Resp: (!) 23   Temp:  36.7 C    Last Pain:  Vitals:   01/24/16 1018  TempSrc: Oral                 Perri Lamagna

## 2016-01-24 NOTE — Progress Notes (Signed)
Patient Name: [name] Date of Encounter: 01/24/2016  Primary Cardiologist: [facility] Problem List     Active Problems:   Rapid atrial fibrillation [facility])   Anemia   Essential hypertension   Wheezing     Subjective   Feels like her breathing is a little better this morning. Heart rate poorly controlled after weaning Cardizem gtt. Notes increased palpitations. Minus 1.4 L for the admission. BP soft overnight leading to the holding of metoprolol. Did get diltiazem at 4:30 AM and metoprolol at 6:30 AM. Heart rate currently in the 130's bpm while laying in bed. Notes palpitations.   Inpatient Medications    Scheduled Meds: . apixaban  5 mg Oral BID  . atorvastatin  10 mg Oral QHS  . diltiazem  60 mg Oral Q6H  . escitalopram  10 mg Oral Daily  . famotidine  20 mg Oral Daily  . flecainide  50 mg Oral Q12H  . insulin aspart  0-5 Units Subcutaneous QHS  . insulin aspart  0-9 Units Subcutaneous TID WC  . insulin glargine  12 Units Subcutaneous Q2200  . levalbuterol  0.63 mg Nebulization Q6H  . metoprolol tartrate  25 mg Oral Q6H  . multivitamin with minerals  1 tablet Oral Daily  . sodium chloride flush  3 mL Intravenous Q12H   Continuous Infusions:  PRN Meds: acetaminophen **OR** acetaminophen, ondansetron **OR** ondansetron (ZOFRAN) IV, oxyCODONE   Vital Signs    Vitals:   01/23/16 2337 01/23/16 2340 01/24/16 0242 01/24/16 0422  BP: 95/76 96/68  130/77  Pulse: 69 60  (!) 129  Resp:    18  Temp:    98 F (36.7 C)  TempSrc:    Oral  SpO2:   92% 90%  Weight:      Height:        Intake/Output Summary (Last 24 hours) at 01/24/16 0748 Last data filed at 01/24/16 0429  Gross per 24 hour  Intake          1772.42 ml  Output             2275 ml  Net          -502.58 ml   Filed Weights   01/22/16 1626  Weight: 204 lb 8.7 oz (92.8 kg)    Physical Exam    GEN: Well nourished, well developed, in no acute distress.  HEENT: Grossly normal.  Neck:  Supple, no JVD, carotid bruits, or masses. Cardiac: Irregularly irregular, no murmurs, rubs, or gallops. No clubbing, cyanosis, edema.  Radials/DP/PT 2+ and equal bilaterally.  Respiratory:  Faint bilateral expiratory wheezing. GI: Soft, nontender, nondistended, BS + x 4. MS: no deformity or atrophy. Skin: warm and dry, no rash. Neuro:  Strength and sensation are intact. Psych: AAOx3.  Normal affect.  Labs    CBC  Recent Labs  01/23/16 0903 01/24/16 0433  WBC 8.1 7.9  HGB 9.8* 9.8*  HCT 29.5* 29.5*  MCV 86.6 85.6  PLT 351 0000000   Basic Metabolic Panel  Recent Labs  01/22/16 1450 01/24/16 0433  NA 136 138  K 4.3 3.9  CL 104 102  CO2 26 28  GLUCOSE 131* 165*  BUN 19 15  CREATININE 1.12* 0.92  CALCIUM 8.7* 8.8*   Liver Function Tests No results for input(s): AST, ALT, ALKPHOS, BILITOT, PROT, ALBUMIN in the last 72 hours. No results for input(s): LIPASE, AMYLASE in the last 72 hours. Cardiac Enzymes  Recent Labs  01/22/16  1450 01/22/16 2018 01/23/16 0225  TROPONINI <0.03 <0.03 <0.03   BNP Invalid input(s): POCBNP D-Dimer No results for input(s): DDIMER in the last 72 hours. Hemoglobin A1C No results for input(s): HGBA1C in the last 72 hours. Fasting Lipid Panel No results for input(s): CHOL, HDL, LDLCALC, TRIG, CHOLHDL, LDLDIRECT in the last 72 hours. Thyroid Function Tests  Recent Labs  01/22/16 1450  TSH 3.402    Telemetry    Afib, rates 90's to 130's bpm - Personally Reviewed  ECG    n/a - Personally Reviewed  Radiology    Dg Chest Port 1 View  Result Date: 01/23/2016 CLINICAL DATA:  Shortness of breath and wheezing EXAM: PORTABLE CHEST 1 VIEW COMPARISON:  None. FINDINGS: There is mild atelectatic change in lung bases. Lungs elsewhere are clear. Heart is mildly enlarged with pulmonary vascularity within normal limits. No adenopathy. No bone lesions. IMPRESSION: Mild bibasilar atelectasis. No edema or consolidation. Mild cardiac enlargement.  Electronically Signed   By: Lowella Grip III M.D.   On: 01/23/2016 08:46    Cardiac Studies   Echo 01/22/2016: Study Conclusions  - Left ventricle: The cavity size was normal. Systolic function was   normal. The estimated ejection fraction was in the range of 55%   to 60%. Wall motion was normal; there were no regional wall   motion abnormalities. The study is not technically sufficient to   allow evaluation of LV diastolic function. - Mitral valve: There was mild regurgitation. - Left atrium: The atrium was moderately dilated. - Right ventricle: Systolic function was normal. - Tricuspid valve: There was mild-moderate regurgitation. - Pulmonary arteries: Systolic pressure was mildly elevated. PA   peak pressure: 47 mm Hg (S). - Inferior vena cava: The vessel was dilated. The respirophasic   diameter changes were blunted (< 50%), consistent with elevated   central venous pressure.  Impressions:  - Rhythm is atrial fibrillation.  Patient Profile     70 year old female with history of PAF on Eliquis, no -significant CAD by cardiac cath in 2010, MVP, DM, and HLD who was admitted to the hospital on 11/20 with Afib with RVR and increased SOB.   Assessment & Plan    1. PAF with RVR; -Rate poorly controlled this morning off Cardizem gtt with heart rates in the 130's bpm at rest -BP soft overnight leading to holding of rate controlling medications, improved now -Will have RN give remaining AM medications now -Add one-time dose of IV digoxin with checking of digoxin level on 11/23 -Continue flecainide 50 mg bid (recent negative ischemic evaluation as above) -Echo with normal LVSF, increased right-side pressure -Continue Eliquis 5 mg bid (has never missed a dose, takes bid) -CHADS2VASc at least 3 (age x 1, DM, female) -Wheezing improved with Xopenex, continue for now, may need to back off on beta blocker if wheezing returns -Given her poorly controlled rates this morning will  discuss with MD about possible DCCV at lunchtime today  2. Wheezing: -Add Xopenex nebs -CXR on 11/21 with no edema or consolidation   3. SOB: -As above -Cannot rule out some degree of worsening anemia given hgb of 9.9 this admission (prior 12 in 2015) -Needs further evaluation per IM -BNP pending  4. MVP: -Echo pending as above  5. HLD: -Lipitor  6. DM: -Per IM  Signed, Christell Faith, PA-C [facility] Pager: [phone] 01/24/2016, 7:48 AM

## 2016-01-24 NOTE — Progress Notes (Signed)
To Specials via bed 

## 2016-01-24 NOTE — Procedures (Signed)
Cardioversion procedure note For atrial fibrillation, paroxysmal  Procedure Details:  Consent: Risks of procedure as well as the alternatives and risks of each were explained to the (patient/caregiver). Consent for procedure obtained.  Time Out: Verified patient identification, verified procedure, site/side was marked, verified correct patient position, special equipment/implants available, medications/allergies/relevent history reviewed, required imaging and test results available. Performed  Patient placed on cardiac monitor, pulse oximetry, supplemental oxygen as necessary.  Sedation given: propofol IV, Dr. Randa Lynn Pacer pads placed anterior and posterior chest.   Cardioverted 1 time(s).  Cardioverted at 150 J. Synchronized biphasic Converted to NSR   Evaluation: Findings: Post procedure EKG shows: NSR Complications: None Patient did tolerate procedure well.  Time Spent Directly with the Patient:  86 minutes   Esmond Plants, M.D., Ph.D.

## 2016-01-24 NOTE — Transfer of Care (Signed)
Immediate Anesthesia Transfer of Care Note  Patient: [name]  Procedure(s) Performed: Procedure(s): CARDIOVERSION (N/A)  Patient Location: PACU and Short Stay  Anesthesia Type:General  Level of Consciousness: awake, alert  and oriented  Airway & Oxygen Therapy: Patient Spontanous Breathing and Patient connected to nasal cannula oxygen  Post-op Assessment: Report given to RN and Post -op Vital signs reviewed and stable  Post vital signs: Reviewed and stable  Last Vitals:  Vitals:   01/24/16 0908 01/24/16 0925  BP: 131/90 102/72  Pulse: (!) 114   Resp:  20  Temp: [id number] C     Complications: No apparent anesthesia complications

## 2016-01-24 NOTE — Anesthesia Preprocedure Evaluation (Addendum)
Anesthesia Evaluation  Patient identified by MRN, date of birth, ID band Patient awake    Reviewed: Allergy & Precautions, NPO status , Patient's Chart, lab work & pertinent test results  History of Anesthesia Complications Negative for: history of anesthetic complications  Airway Mallampati: II  TM Distance: >3 FB Neck ROM: Full    Dental  (+) Poor Dentition   Pulmonary shortness of breath, sleep apnea and Continuous Positive Airway Pressure Ventilation , neg COPD,    breath sounds clear to auscultation- rhonchi (-) wheezing      Cardiovascular hypertension, (-) CAD, (-) Past MI and (-) Cardiac Stents + dysrhythmias (poor rate control) Atrial Fibrillation  Rhythm:Regular Rate:Normal - Systolic murmurs and - Diastolic murmurs Echo [id number]: - Left ventricle: The cavity size was normal. Systolic function was normal. The estimated ejection fraction was in the range of 55% to 60%. Wall motion was normal; there were no regional wall motion abnormalities. The study is not technically sufficient to allow evaluation of LV diastolic function. - Mitral valve: There was mild regurgitation. - Left atrium: The atrium was moderately dilated. - Right ventricle: Systolic function was normal. - Tricuspid valve: There was mild-moderate regurgitation. - Pulmonary arteries: Systolic pressure was mildly elevated. PA peak pressure: 47 mm Hg (S). - Inferior vena cava: The vessel was dilated. The respirophasic diameter changes were blunted (<50%), consistent with elevated central venous pressure.   Neuro/Psych  Headaches, Depression    GI/Hepatic negative GI ROS, Neg liver ROS,   Endo/Other  diabetes, Insulin Dependent, Oral Hypoglycemic Agents  Renal/GU negative Renal ROS     Musculoskeletal   Abdominal (+) + obese,   Peds  Hematology  (+) anemia ,   Anesthesia Other Findings Past Medical History: No date: Depressive  disorder, not elsewhere classified No date: Diabetes mellitus without mention of complicat* No date: Diaphragmatic hernia without mention of obstru* No date: Disorders of bursae and tendons in shoulder re* No date: Diverticulosis of colon (without mention of he* No date: Headache(784.0) No date: Internal hemorrhoids without mention of compli* No date: Obesity, unspecified No date: Other urinary incontinence No date: Paroxysmal atrial fibrillation (HCC) No date: Pure hypercholesterolemia   Reproductive/Obstetrics                            Anesthesia Physical Anesthesia Plan  ASA: III  Anesthesia Plan: General   Post-op Pain Management:    Induction: Intravenous  Airway Management Planned: Natural Airway  Additional Equipment:   Intra-op Plan:   Post-operative Plan:   Informed Consent: I have reviewed the patients History and Physical, chart, labs and discussed the procedure including the risks, benefits and alternatives for the proposed anesthesia with the patient or authorized representative who has indicated his/her understanding and acceptance.   Dental advisory given  Plan Discussed with: CRNA and Anesthesiologist  Anesthesia Plan Comments:         Anesthesia Quick Evaluation

## 2016-01-24 NOTE — Anesthesia Procedure Notes (Signed)
Date/Time: 01/24/2016 9:15 AM Performed by: Doreen Salvage Pre-anesthesia Checklist: Patient identified, Emergency Drugs available, Suction available and Patient being monitored Patient Re-evaluated:Patient Re-evaluated prior to inductionOxygen Delivery Method: Nasal cannula Intubation Type: IV induction Dental Injury: Teeth and Oropharynx as per pre-operative assessment  Comments: Nasal cannula with etCO2 monitoring

## 2016-01-24 NOTE — Progress Notes (Signed)
[place] at [place] NAME: [name]    MRN#:  [id number]  DATE OF BIRTH:  1945-04-19  SUBJECTIVE:  Hospital Day: 2 days [name] is a 70 y.o. female presenting with Palpitations.   Overnight events: No acute overnight events Interval Events: Had episode A. fib RVR this morning are 130s received extra dose of flecainide  Patient for DC cardioversion around lunchtime per cardiology  REVIEW OF SYSTEMS:  CONSTITUTIONAL: No fever, fatigue or weakness.  EYES: No blurred or double vision.  EARS, NOSE, AND THROAT: No tinnitus or ear pain.  RESPIRATORY: No cough, shortness of breath, wheezing or hemoptysis.  CARDIOVASCULAR: No chest pain, orthopnea, edema.  GASTROINTESTINAL: No nausea, vomiting, diarrhea or abdominal pain.  GENITOURINARY: No dysuria, hematuria.  ENDOCRINE: No polyuria, nocturia,  HEMATOLOGY: No anemia, easy bruising or bleeding SKIN: No rash or lesion. MUSCULOSKELETAL: No joint pain or arthritis.   NEUROLOGIC: No tingling, numbness, weakness.  PSYCHIATRY: No anxiety or depression.   DRUG ALLERGIES:   Allergies  Allergen Reactions  . Cortisone   . Pravastatin Sodium     REACTION: leg pain    VITALS:  Blood pressure (!) 118/59, pulse 99, temperature 98 F (36.7 C), temperature source Oral, resp. rate (!) 23, height 5\' 5"  (1.651 m), weight 92.8 kg (204 lb 8.7 oz), SpO2 91 %.  PHYSICAL EXAMINATION:  VITAL SIGNS: Vitals:   01/24/16 1000 01/24/16 1018  BP: 115/63 (!) 118/59  Pulse: 63 99  Resp: (!) 23   Temp:  98 F (36.7 C)   Patient evaluated after cardioversion  GENERAL:70 y.o.female currently in no acute distress.  HEAD: Normocephalic, atraumatic.  EYES: Pupils equal, round, reactive to light. Extraocular muscles intact. No scleral icterus.  MOUTH: Moist mucosal membrane. Dentition intact. No abscess noted.  EAR, NOSE, THROAT: Clear without exudates. No external lesions.  NECK: Supple.  No thyromegaly. No nodules. No JVD.  PULMONARY: Clear to ascultation, without wheeze rails or rhonci. No use of accessory muscles, Good respiratory effort. good air entry bilaterally CHEST: Nontender to palpation.  CARDIOVASCULAR: S1 and S2. Regular rate and rhythm. No murmurs, rubs, or gallops. No edema. Pedal pulses 2+ bilaterally.  GASTROINTESTINAL: Soft, nontender, nondistended. No masses. Positive bowel sounds. No hepatosplenomegaly.  MUSCULOSKELETAL: No swelling, clubbing, or edema. Range of motion full in all extremities.  NEUROLOGIC: Cranial nerves II through XII are intact. No gross focal neurological deficits. Sensation intact. Reflexes intact.  SKIN: No ulceration, lesions, rashes, or cyanosis. Skin warm and dry. Turgor intact.  PSYCHIATRIC: Mood, affect within normal limits. The patient is awake, alert and oriented x 3. Insight, judgment intact.      LABORATORY PANEL:   CBC  Recent Labs Lab 01/24/16 0433  WBC 7.9  HGB 9.8*  HCT 29.5*  PLT 349   ------------------------------------------------------------------------------------------------------------------  Chemistries   Recent Labs Lab 01/24/16 0433  NA 138  K 3.9  CL 102  CO2 28  GLUCOSE 165*  BUN 15  CREATININE 0.92  CALCIUM 8.8*   ------------------------------------------------------------------------------------------------------------------  Cardiac Enzymes  Recent Labs Lab 01/23/16 0225  TROPONINI <0.03   ------------------------------------------------------------------------------------------------------------------  RADIOLOGY:  Dg Chest Port 1 View  Result Date: 01/23/2016 CLINICAL DATA:  Shortness of breath and wheezing EXAM: PORTABLE CHEST 1 VIEW COMPARISON:  None. FINDINGS: There is mild atelectatic change in lung bases. Lungs elsewhere are clear. Heart is mildly enlarged with pulmonary vascularity within normal limits. No adenopathy. No bone lesions. IMPRESSION: Mild bibasilar  atelectasis.  No edema or consolidation. Mild cardiac enlargement. Electronically Signed   By: Lowella Grip III M.D.   On: 01/23/2016 08:46    EKG:   Orders placed or performed during the hospital encounter of 01/22/16  . EKG 12-Lead  . EKG 12-Lead    ASSESSMENT AND PLAN:   [name] is a 70 y.o. female presenting with Palpitations. Admitted 01/22/2016 : Day #: 2 days 1. Atrial fibrillation rapid ventricular response: Underwent DC cardioversion with successful conversion to sinus rhythm Continue with flecainide, Cardizem  2. Hyperlipidemia unspecified: Statin therapy 3. Type 2 diabetes insulin requiring hold oral agents continue sliding scale basal insulin  All the records are reviewed and case discussed with Care Management/Social Workerr. Management plans discussed with the patient, family and they are in agreement.  CODE STATUS: full TOTAL TIME TAKING CARE OF THIS PATIENT: 28 minutes.   POSSIBLE D/C IN 1DAYS, DEPENDING ON CLINICAL CONDITION.   [name],  [name].D on 01/24/2016 at 1:57 PM  Between 7am to 6pm - Pager - [phone]  After 6pm: House Pager: - [facility]  Office  [phone]  CC: Primary care physician; Eliezer Lofts, MD

## 2016-01-25 DIAGNOSIS — I4891 Unspecified atrial fibrillation: Secondary | ICD-10-CM

## 2016-01-25 LAB — DIGOXIN LEVEL: Digoxin Level: 0.7 ng/mL — ABNORMAL LOW (ref 0.8–2.0)

## 2016-01-25 LAB — GLUCOSE, CAPILLARY: Glucose-Capillary: 157 mg/dL — ABNORMAL HIGH (ref 65–99)

## 2016-01-25 MED ORDER — FLECAINIDE ACETATE 50 MG PO TABS: 50.0000 mg | ORAL_TABLET | Freq: Two times a day (BID) | ORAL | 0 refills | Status: DC

## 2016-01-25 MED ORDER — DILTIAZEM HCL ER COATED BEADS 180 MG PO CP24: 180.0000 mg | ORAL_CAPSULE | Freq: Every day | ORAL | 0 refills | Status: DC

## 2016-01-25 NOTE — Progress Notes (Signed)
Patient discharged home at this time as ordered,instructions explained and well understood,prescriptions given,hemodynamics within normal limits,escorted by spouse and staff member via wheel chair.

## 2016-01-25 NOTE — Discharge Summary (Signed)
[name] at [place] NAME: [name]    MR#:  [id number]  DATE OF BIRTH:  1945/07/04  DATE OF ADMISSION:  01/22/2016 ADMITTING PHYSICIAN: Gladstone Lighter, MD  DATE OF DISCHARGE: 01/25/16  PRIMARY CARE PHYSICIAN: Eliezer Lofts, MD    ADMISSION DIAGNOSIS:  A-Fib with RVR  DISCHARGE DIAGNOSIS:  Active Problems:   Rapid atrial fibrillation [facility])   Essential hypertension      SECONDARY DIAGNOSIS:   Past Medical History:  Diagnosis Date  . Depressive disorder, not elsewhere classified   . Diabetes mellitus without mention of complication   . Diaphragmatic hernia without mention of obstruction or gangrene   . Disorders of bursae and tendons in shoulder region, unspecified   . Diverticulosis of colon (without mention of hemorrhage)   . Headache(784.0)   . Internal hemorrhoids without mention of complication   . Obesity, unspecified   . Other urinary incontinence   . Paroxysmal atrial fibrillation (HCC)   . Pure hypercholesterolemia     HOSPITAL COURSE:  [name]  is a 70 y.o. female admitted 01/22/2016 with chief complaint palpitations. Please see H&P performed by Gladstone Lighter, MD for further information. Patient presented with palpitations found to be in atrial fib with rapid response. She was placed on cardizem drip with some rate control but ultimately required DC cardioversion with the assistance of cardiology. She is now in NSR  DISCHARGE CONDITIONS:   stable  CONSULTS OBTAINED:  Treatment Team:  Lytle Butte, MD [name], MD  DRUG ALLERGIES:   Allergies  Allergen Reactions  . Cortisone   . Pravastatin Sodium     REACTION: leg pain    DISCHARGE MEDICATIONS:   Current Discharge Medication List    START taking these medications   Details  diltiazem (CARDIZEM CD) 180 MG 24 hr capsule Take 1 capsule (180 mg total) by mouth daily. Qty: 30 capsule, Refills: 0    flecainide  (TAMBOCOR) 50 MG tablet Take 1 tablet (50 mg total) by mouth every 12 (twelve) hours. Qty: 60 tablet, Refills: 0      CONTINUE these medications which have NOT CHANGED   Details  apixaban (ELIQUIS) 5 MG TABS tablet Take 1 tablet (5 mg total) by mouth 2 (two) times daily. Qty: 60 tablet, Refills: 11    atorvastatin (LIPITOR) 10 MG tablet TAKE ONE TABLET BY MOUTH EVERY NIGHT AT BEDTIME Qty: 30 tablet, Refills: 11    Calcium Carb-Ergocalciferol 500-200 MG-UNIT TABS Take by mouth.    escitalopram (LEXAPRO) 10 MG tablet TAKE ONE TABLET BY MOUTH ONCE DAILY Qty: 90 tablet, Refills: 1    Insulin Glargine (LANTUS SOLOSTAR) 100 UNIT/ML Solostar Pen Inject 12-14 Units into the skin daily at 10 pm. Qty: 30 mL, Refills: 2    linagliptin (TRADJENTA) 5 MG TABS tablet Take 1 tablet (5 mg total) by mouth daily. Qty: 30 tablet, Refills: 11    metFORMIN (GLUCOPHAGE-XR) 500 MG 24 hr tablet Take 4 tablets (2,000 mg total) by mouth daily with supper. Qty: 360 tablet, Refills: 3    Multiple Vitamin (MULTIVITAMIN) tablet Take 1 tablet by mouth daily.      ranitidine (ZANTAC) 150 MG tablet Take 150 mg by mouth daily.      glucose blood (ONE TOUCH ULTRA TEST) test strip CHECK BLOOD SUGAR 2 TIMES A DAY AS DIRECTED Qty: 200 each, Refills: 3    Insulin Pen Needle (B-D ULTRAFINE III SHORT PEN) 31G X 8 MM  MISC Use as directed Dx:250.00 Qty: 100 each, Refills: 6      STOP taking these medications     metoprolol tartrate (LOPRESSOR) 25 MG tablet          DISCHARGE INSTRUCTIONS:    DIET:  Cardiac diet  DISCHARGE CONDITION:  Stable  ACTIVITY:  Activity as tolerated  OXYGEN:  Home Oxygen: No.   Oxygen Delivery: room air  DISCHARGE LOCATION:  home   If you experience worsening of your admission symptoms, develop shortness of breath, life threatening emergency, suicidal or homicidal thoughts you must seek medical attention immediately by calling 911 or calling your MD immediately  if  symptoms less severe.  You Must read complete instructions/literature along with all the possible adverse reactions/side effects for all the Medicines you take and that have been prescribed to you. Take any new Medicines after you have completely understood and accpet all the possible adverse reactions/side effects.   Please note  You were cared for by a hospitalist during your hospital stay. If you have any questions about your discharge medications or the care you received while you were in the hospital after you are discharged, you can call the unit and asked to speak with the hospitalist on call if the hospitalist that took care of you is not available. Once you are discharged, your primary care physician will handle any further medical issues. Please note that NO REFILLS for any discharge medications will be authorized once you are discharged, as it is imperative that you return to your primary care physician (or establish a relationship with a primary care physician if you do not have one) for your aftercare needs so that they can reassess your need for medications and monitor your lab values.    On the day of Discharge:   VITAL SIGNS:  Blood pressure (!) 133/59, pulse 78, temperature 97.7 F (36.5 C), temperature source Oral, resp. rate 16, height 5\' 5"  (1.651 m), weight 92.8 kg (204 lb 8.7 oz), SpO2 92 %.  I/O:   Intake/Output Summary (Last 24 hours) at 01/25/16 1024 Last data filed at 01/25/16 0440  Gross per 24 hour  Intake              723 ml  Output             2175 ml  Net            -1452 ml    PHYSICAL EXAMINATION:  GENERAL:  70 y.o.-year-old patient lying in the bed with no acute distress.  EYES: Pupils equal, round, reactive to light and accommodation. No scleral icterus. Extraocular muscles intact.  HEENT: Head atraumatic, normocephalic. Oropharynx and nasopharynx clear.  NECK:  Supple, no jugular venous distention. No thyroid enlargement, no tenderness.  LUNGS:  Normal breath sounds bilaterally, no wheezing, rales,rhonchi or crepitation. No use of accessory muscles of respiration.  CARDIOVASCULAR: S1, S2 normal. No murmurs, rubs, or gallops.  ABDOMEN: Soft, non-tender, non-distended. Bowel sounds present. No organomegaly or mass.  EXTREMITIES: No pedal edema, cyanosis, or clubbing.  NEUROLOGIC: Cranial nerves II through XII are intact. Muscle strength 5/5 in all extremities. Sensation intact. Gait not checked.  PSYCHIATRIC: The patient is alert and oriented x 3.  SKIN: No obvious rash, lesion, or ulcer.   DATA REVIEW:   CBC  Recent Labs Lab 01/24/16 0433  WBC 7.9  HGB 9.8*  HCT 29.5*  PLT 349    Chemistries   Recent Labs Lab 01/24/16 0433  NA 138  K 3.9  CL 102  CO2 28  GLUCOSE 165*  BUN 15  CREATININE 0.92  CALCIUM 8.8*    Cardiac Enzymes  Recent Labs Lab 01/23/16 0225  TROPONINI <0.03    Microbiology Results  Results for orders placed or performed in visit on 06/12/15  Urine culture     Status: None   Collection Time: 06/12/15  9:28 AM  Result Value Ref Range Status   Colony Count NO GROWTH  Final   Organism ID, Bacteria NO GROWTH  Final    RADIOLOGY:  No results found.   Management plans discussed with the patient, family and they are in agreement.  CODE STATUS:     Code Status Orders        Start     Ordered   01/22/16 1436  Full code  Continuous     01/22/16 1436    Code Status History    Date Active Date Inactive Code Status Order ID Comments User Context   This patient has a current code status but no historical code status.    Advance Directive Documentation   [place] Most Recent Value  Type of Advance Directive  Living will  Pre-existing out of facility DNR order (yellow form or pink MOST form)  No data  "MOST" Form in Place?  No data      TOTAL TIME TAKING CARE OF THIS PATIENT: 33 minutes.    Georgina Krist,  Karenann Cai.D on 01/25/2016 at 10:24 AM  Between 7am to 6pm - Pager -  [phone]  After 6pm go to [email or web address] - Proofreader  [facility] [place] [facility]  Office  [phone]  CC: Primary care physician; Eliezer Lofts, MD

## 2016-01-25 NOTE — Progress Notes (Signed)
Patient Name: [name] Date of Encounter: 01/25/2016  Primary Cardiologist: [facility] Problem List     Active Problems:   Rapid atrial fibrillation [facility])   Anemia   Essential hypertension   Wheezing     Subjective   She underwent Successful cardioversion yesterday and currently she is in normal sinus rhythm.  Inpatient Medications    Scheduled Meds: . apixaban  5 mg Oral BID  . atorvastatin  10 mg Oral QHS  . diltiazem  30 mg Oral Q6H  . escitalopram  10 mg Oral Daily  . famotidine  20 mg Oral Daily  . flecainide  50 mg Oral Q12H  . insulin aspart  0-5 Units Subcutaneous QHS  . insulin aspart  0-9 Units Subcutaneous TID WC  . insulin glargine  12 Units Subcutaneous Q2200  . levalbuterol  0.63 mg Nebulization Q6H  . multivitamin with minerals  1 tablet Oral Daily  . sodium chloride flush  3 mL Intravenous Q12H   Continuous Infusions:  PRN Meds: acetaminophen **OR** acetaminophen, ondansetron **OR** ondansetron (ZOFRAN) IV, oxyCODONE   Vital Signs    Vitals:   01/25/16 0239 01/25/16 0436 01/25/16 0746 01/25/16 0845  BP:  (!) 121/58  (!) 133/59  Pulse:  75  78  Resp:  18  16  Temp:  98 F (36.7 C)  97.7 F (36.5 C)  TempSrc:  Oral  Oral  SpO2: 93% 90% 92% 92%  Weight:      Height:        Intake/Output Summary (Last 24 hours) at 01/25/16 1019 Last data filed at 01/25/16 0440  Gross per 24 hour  Intake              723 ml  Output             2175 ml  Net            -1452 ml   Filed Weights   01/22/16 1626  Weight: 204 lb 8.7 oz (92.8 kg)    Physical Exam    GEN: Well nourished, well developed, in no acute distress.  HEENT: Grossly normal.  Neck: Supple, no JVD, carotid bruits, or masses. Cardiac: RRR, no murmurs, rubs, or gallops. No clubbing, cyanosis, edema.  Radials/DP/PT 2+ and equal bilaterally.  Respiratory:  CTA GI: Soft, nontender, nondistended, BS + x 4. MS: no deformity or atrophy. Skin: warm and dry, no  rash. Neuro:  Strength and sensation are intact. Psych: AAOx3.  Normal affect.  Labs    CBC  Recent Labs  01/23/16 0903 01/24/16 0433  WBC 8.1 7.9  HGB 9.8* 9.8*  HCT 29.5* 29.5*  MCV 86.6 85.6  PLT 351 0000000   Basic Metabolic Panel  Recent Labs  01/22/16 1450 01/24/16 0433  NA 136 138  K 4.3 3.9  CL 104 102  CO2 26 28  GLUCOSE 131* 165*  BUN 19 15  CREATININE 1.12* 0.92  CALCIUM 8.7* 8.8*   Liver Function Tests No results for input(s): AST, ALT, ALKPHOS, BILITOT, PROT, ALBUMIN in the last 72 hours. No results for input(s): LIPASE, AMYLASE in the last 72 hours. Cardiac Enzymes  Recent Labs  01/22/16 1450 01/22/16 2018 01/23/16 0225  TROPONINI <0.03 <0.03 <0.03   BNP Invalid input(s): POCBNP D-Dimer No results for input(s): DDIMER in the last 72 hours. Hemoglobin A1C No results for input(s): HGBA1C in the last 72 hours. Fasting Lipid Panel No results for input(s): CHOL, HDL, LDLCALC, TRIG, CHOLHDL,  LDLDIRECT in the last 72 hours. Thyroid Function Tests  Recent Labs  01/22/16 1450  TSH 3.402    Telemetry    Afib, rates 90's to 130's bpm - Personally Reviewed  ECG    n/a - Personally Reviewed  Radiology    No results found.  Cardiac Studies   Echo 01/22/2016: Study Conclusions  - Left ventricle: The cavity size was normal. Systolic function was   normal. The estimated ejection fraction was in the range of 55%   to 60%. Wall motion was normal; there were no regional wall   motion abnormalities. The study is not technically sufficient to   allow evaluation of LV diastolic function. - Mitral valve: There was mild regurgitation. - Left atrium: The atrium was moderately dilated. - Right ventricle: Systolic function was normal. - Tricuspid valve: There was mild-moderate regurgitation. - Pulmonary arteries: Systolic pressure was mildly elevated. PA   peak pressure: 47 mm Hg (S). - Inferior vena cava: The vessel was dilated. The  respirophasic   diameter changes were blunted (< 50%), consistent with elevated   central venous pressure.  Impressions:  - Rhythm is atrial fibrillation.  Patient Profile     70 year old female with history of PAF on Eliquis, no -significant CAD by cardiac cath in 2010, MVP, DM, and HLD who was admitted to the hospital on 11/20 with Afib with RVR and increased SOB.   Assessment & Plan    1. PAF with RVR;  She underwent successful cardioversion yesterday. Continue flecainide 50 mg twice daily. Switch diltiazem to extended release 180 mg once daily. Metoprolol was discontinued due to wheezing.  2. Wheezing: -Add Xopenex nebs -CXR on 11/21 with no edema or consolidation   3. SOB: - BNP was only 181. She likely had a component of diastolic heart failure related to atrial fibrillation.  4. MVP: -Echo showed normal LV systolic function with mild mitral regurgitation and mild-to-moderate pulmonary hypertension.  5. HLD: -Lipitor  The patient can be discharged home from a cardiac standpoint. I will arrange for follow-up with me in one to 2 weeks.  Signed, Kathlyn Sacramento, MD  [facility] 01/25/2016, 10:19 AM

## 2016-01-26 ENCOUNTER — Other Ambulatory Visit: Payer: Self-pay | Admitting: Cardiovascular Disease

## 2016-01-29 ENCOUNTER — Telehealth: Payer: Self-pay | Admitting: Cardiovascular Disease

## 2016-01-29 NOTE — Telephone Encounter (Signed)
Patient contacted regarding discharge from [facility] on 01/25/16.  Patient understands to follow up with provider Dr. Fletcher Anon on 12/4 at 2:30pm at [facility]. Patient understands discharge instructions? yes Patient understands medications and regiment? yes Patient understands to bring all medications to this visit? yes

## 2016-01-30 LAB — GLUCOSE, CAPILLARY: Glucose-Capillary: 94 mg/dL (ref 65–99)

## 2016-02-05 ENCOUNTER — Encounter: Payer: Self-pay | Admitting: Cardiovascular Disease

## 2016-02-05 ENCOUNTER — Ambulatory Visit (INDEPENDENT_AMBULATORY_CARE_PROVIDER_SITE_OTHER): Payer: 59 | Admitting: Cardiovascular Disease

## 2016-02-05 VITALS — BP 148/60 | HR 63 | Ht 65.5 in | Wt 194.0 lb

## 2016-02-05 DIAGNOSIS — I4891 Unspecified atrial fibrillation: Secondary | ICD-10-CM | POA: Diagnosis not present

## 2016-02-05 DIAGNOSIS — I340 Nonrheumatic mitral (valve) insufficiency: Secondary | ICD-10-CM

## 2016-02-05 DIAGNOSIS — I10 Essential (primary) hypertension: Secondary | ICD-10-CM

## 2016-02-05 DIAGNOSIS — G4730 Sleep apnea, unspecified: Secondary | ICD-10-CM

## 2016-02-05 NOTE — Progress Notes (Signed)
Cardiology Office Note   Date:  02/05/2016   ID:  [name], DOB Jul 19, 1945, MRN [id number]  PCP:  Eliezer Lofts, MD  Cardiologist:   Kathlyn Sacramento, MD   Chief Complaint  Patient presents with  . other    F/u Afib discharge. Pt states she is doing well. Reviewed meds with pt verbally.      History of Present Illness: [name] is a 70 y.o. female who presents for a followup visit regarding paroxysmal atrial fibrillation and mitral valve prolapse.  She had previous cardiac catheterization in October of 2010 for atypical chest pain. It showed no significant coronary artery disease with mildly elevated left ventricular end-diastolic pressure and normal ejection fraction. She has known history of diabetes and hyperlipidemia. Nuclear stress test in July 2015 was normal. She was seen recently for A. Fib with RVR With difficult to control ventricular rate. She was hospitalized and was started on flecainide. She subsequently underwent successful cardioversion with no issues since then. She had an echocardiogram done which showed normal LV systolic function with mild mitral regurgitation, moderately dilated left atrium and mild to moderate pulmonary hypertension. She had wheezing that improved with stopping metoprolol. She has been doing well since hospital discharge with no recurrent palpitations or chest pain. Shortness of breath is gradually improving. She reports known history of sleep apnea and she uses CPAP. However, her husband noticed that she still makes very loud noises and her machine has not been checked in many years.   Past Medical History:  Diagnosis Date  . Depressive disorder, not elsewhere classified   . Diabetes mellitus without mention of complication   . Diaphragmatic hernia without mention of obstruction or gangrene   . Disorders of bursae and tendons in shoulder region, unspecified   . Diverticulosis of colon (without mention of hemorrhage)   .  Headache(784.0)   . Internal hemorrhoids without mention of complication   . Obesity, unspecified   . Other urinary incontinence   . Paroxysmal atrial fibrillation (HCC)   . Pure hypercholesterolemia     Past Surgical History:  Procedure Laterality Date  . ABDOMINAL HYSTERECTOMY  1972   Have both ovaries  . BREAST SURGERY     Red[place]  . CARDIAC CATHETERIZATION     [place]    . ELECTROPHYSIOLOGIC STUDY N/A 01/24/2016   Procedure: CARDIOVERSION;  Surgeon: [name], MD;  Location: [facility] ORS;  Service: Cardiovascular;  Laterality: N/A;  . TONSILLECTOMY       Current Outpatient Prescriptions  Medication Sig Dispense Refill  . atorvastatin (LIPITOR) 10 MG tablet TAKE ONE TABLET BY MOUTH EVERY NIGHT AT BEDTIME 30 tablet 11  . Calcium Carb-Ergocalciferol 500-200 MG-UNIT TABS Take by mouth.    . diltiazem (CARDIZEM CD) 180 MG 24 hr capsule Take 1 capsule (180 mg total) by mouth daily. 30 capsule 0  . ELIQUIS 5 MG TABS tablet TAKE 1 TABLET BY MOUTH TWICE A DAY 60 tablet 6  . escitalopram (LEXAPRO) 10 MG tablet TAKE ONE TABLET BY MOUTH ONCE DAILY 90 tablet 1  . flecainide (TAMBOCOR) 50 MG tablet Take 1 tablet (50 mg total) by mouth every 12 (twelve) hours. 60 tablet 0  . glucose blood (ONE TOUCH ULTRA TEST) test strip CHECK BLOOD SUGAR 2 TIMES A DAY AS DIRECTED 200 each 3  . Insulin Glargine (LANTUS SOLOSTAR) 100 UNIT/ML Solostar Pen Inject 12-14 Units into the skin daily at 10 pm. (Patient taking differently: Inject  12 Units into the skin daily at 10 pm. ) 30 mL 2  . Insulin Pen Needle (B-D ULTRAFINE III SHORT PEN) 31G X 8 MM MISC Use as directed Dx:250.00 100 each 6  . linagliptin (TRADJENTA) 5 MG TABS tablet Take 1 tablet (5 mg total) by mouth daily. 30 tablet 11  . metFORMIN (GLUCOPHAGE-XR) 500 MG 24 hr tablet Take 4 tablets (2,000 mg total) by mouth daily with supper. (Patient taking differently: Take 1,000 mg by mouth 2 (two) times daily. ) 360 tablet 3  .  Multiple Vitamin (MULTIVITAMIN) tablet Take 1 tablet by mouth daily.      . ranitidine (ZANTAC) 150 MG tablet Take 150 mg by mouth daily.       No current facility-administered medications for this visit.     Allergies:   Cortisone and Pravastatin sodium    Social History:  The patient  reports that she has never smoked. She has never used smokeless tobacco. She reports that she does not drink alcohol or use drugs.   Family History:  The patient's family history includes Cancer in her father and mother; Dementia in her mother; Diabetes in her brother; Hyperlipidemia in her father and mother; Hypertension in her sister; Pulmonary embolism in her mother; Stroke in her paternal grandfather and paternal grandmother.    ROS:  Please see the history of present illness.   Otherwise, review of systems are positive for none.   All other systems are reviewed and negative.    PHYSICAL EXAM: VS:  BP (!) 148/60 (BP Location: Left Arm, Patient Position: Sitting, Cuff Size: Normal)   Pulse 63   Ht 5' 5.5" (1.664 m)   Wt 194 lb (88 kg)   LMP  (LMP Unknown)   BMI 31.79 kg/m  , BMI Body mass index is 31.79 kg/m. GEN: Well nourished, well developed, in no acute distress  HEENT: normal  Neck: no JVD, carotid bruits, or masses Cardiac: Regular rate and rhythm; no  rubs, or gallops,no edema . There is a 2/6 systolic ejection murmur at the base. Respiratory:  clear to auscultation bilaterally, normal work of breathing GI: soft, nontender, nondistended, + BS MS: no deformity or atrophy  Skin: warm and dry, no rash Neuro:  Strength and sensation are intact Psych: euthymic mood, full affect   EKG:  EKG is ordered today. Normal sinus rhythm with sinus arrhythmia. Normal PR and QT intervals.   Recent Labs: 05/09/2015: ALT 15 01/22/2016: TSH 3.402 01/24/2016: B Natriuretic Peptide 181.0; BUN 15; Creatinine, Ser 0.92; Hemoglobin 9.8; Platelets 349; Potassium 3.9; Sodium 138    Lipid Panel      Component Value Date/Time   CHOL 145 05/09/2015 0937   TRIG 73.0 05/09/2015 0937   HDL 44.50 05/09/2015 0937   CHOLHDL 3 05/09/2015 0937   VLDL 14.6 05/09/2015 0937   LDLCALC 86 05/09/2015 0937   LDLDIRECT 163.1 07/04/2011 0922      Wt Readings from Last 3 Encounters:  02/05/16 194 lb (88 kg)  01/22/16 204 lb 8.7 oz (92.8 kg)  01/22/16 204 lb (92.5 kg)       No flowsheet data found.    ASSESSMENT AND PLAN:  1.   Persistent atrial fibrillation: Currently maintaining sinus rhythm after recent cardioversion and starting flecainide. She is also on diltiazem and long-term anticoagulation with Eliquis. I made no changes today. If she develops recurrent atrial fibrillation, we can consider increasing the dose of flecainide for consider catheter ablation but that might be somewhat limited  by moderately dilated left atrium and known history of mitral valve prolapse.  2. Mitral valve prolapse with moderate regurgitation: Repeat echocardiogram recently showed only mild mitral regurgitation but she was in A. fib at that time.  3. Hyperlipidemia:Currently on atorvastatin.  4. Sleep apnea: Have sleep apnea might not be well treated with her CPAP machine which has not been checked in many years. This might be contributing to atrial fibrillation and thus I referred her to pulmonary.    Disposition:  Follow up with me in 6 months.  Signed,  Kathlyn Sacramento, MD  02/05/2016 2:48 PM    [place]

## 2016-02-05 NOTE — Patient Instructions (Signed)
Medication Instructions:  Your physician recommends that you continue on your current medications as directed. Please refer to the Current Medication list given to you today.   Labwork: none  Testing/Procedures: You have been referred to Pulmonary for management of sleep apnea   Follow-Up: Your physician wants you to follow-up in: six months with Dr. Fletcher Anon.  You will receive a reminder letter in the mail two months in advance. If you don't receive a letter, please call our office to schedule the follow-up appointment.   Any Other Special Instructions Will Be Listed Below (If Applicable).     If you need a refill on your cardiac medications before your next appointment, please call your pharmacy.

## 2016-02-09 ENCOUNTER — Ambulatory Visit (INDEPENDENT_AMBULATORY_CARE_PROVIDER_SITE_OTHER): Payer: 59 | Admitting: Pulmonary Disease

## 2016-02-09 ENCOUNTER — Encounter: Payer: Self-pay | Admitting: Pulmonary Disease

## 2016-02-09 VITALS — BP 122/80 | HR 65 | Wt 190.0 lb

## 2016-02-09 DIAGNOSIS — G4733 Obstructive sleep apnea (adult) (pediatric): Secondary | ICD-10-CM | POA: Diagnosis not present

## 2016-02-09 DIAGNOSIS — I272 Pulmonary hypertension, unspecified: Secondary | ICD-10-CM

## 2016-02-09 DIAGNOSIS — G4700 Insomnia, unspecified: Secondary | ICD-10-CM | POA: Diagnosis not present

## 2016-02-09 DIAGNOSIS — R400 Somnolence: Secondary | ICD-10-CM

## 2016-02-09 MED ORDER — TRAZODONE HCL 50 MG PO TABS: 50.0000 mg | ORAL_TABLET | Freq: Every evening | ORAL | 5 refills | Status: DC | PRN

## 2016-02-09 NOTE — Patient Instructions (Addendum)
1) Continue CPAP @ current setting of 13 cm H2O 2) Trial of trazodone 50-100 mg (1-2 tablets) each night as needed for insomnia 3) Follow up in 4-6 weeks and we will discuss whether we need to investigate further with a CPAP titration study or with an auto-titrating machine

## 2016-02-10 ENCOUNTER — Telehealth: Payer: Self-pay | Admitting: Physician Assistant

## 2016-02-10 ENCOUNTER — Other Ambulatory Visit: Payer: Self-pay | Admitting: Physician Assistant

## 2016-02-10 DIAGNOSIS — I480 Paroxysmal atrial fibrillation: Secondary | ICD-10-CM

## 2016-02-10 MED ORDER — DILTIAZEM HCL ER COATED BEADS 120 MG PO CP24: 120.0000 mg | ORAL_CAPSULE | Freq: Every day | ORAL | 11 refills | Status: DC

## 2016-02-10 NOTE — Telephone Encounter (Signed)
Pt called because BP on the low side this am, not sure she should take rx.  She was recently dx w/ PAF and started on Flecainide and Diltiazem 180 mg. Has not been symptomatic, but checked her BP this am and it was 102/40. No palpitations, no more afib.  Is it ok to take the meds?  Advised her to not miss a dose of the Flecainide, but we could decrease the Cardizem. She prefers to do this, so sent in Cardizem CD 120 mg qd. She will let us know if she has sx or does not tolerate the lower dose.  No other issues or concerns.  Rosaria Ferries, PA-C 02/10/2016 10:30 AM [facility] [phone]

## 2016-02-12 NOTE — Progress Notes (Signed)
PULMONARY CONSULT NOTE  Requesting MD/Service: Fletcher Anon Date of initial consultation: 02/09/16 Reason for consultation: OSA  PT PROFILE: 70 y.o. F never smoker with history of OSA diagnosed 09/2008 referred by Dr Fletcher Anon for evaluation of persistent daytime hypersomnolence  DATA: Sleep study 09/14/08 [facility]): AHI 32/hr with predominance of hypopneas. SpO2 nadir 86%. Echocardiogram 10/08/13: Normal LVEF and diastolic function, Mild MVP, moderate MR, trivial TR,  RVSP not estimated  Echocardiogram 01/22/16: Atrial fibirllation, LVEF 55-60%, mild MR, LA moderately dilated, mild to moderate TR, RVSP estimate 47 mmHg  HPI:  As above. Pt underwent sleep study in 2010 @ [facility] after presenting with snoring and dytime sleepiness. She has worn CPAP since then, initially with a setting of 11 cm H2O, This was subsequently increased to 13 cm H2O. At first, this made a significant difference in her symptoms of hypersomnolence. She was seen by Dr Fletcher Anon for PAF last week and noted increased sleepiness and pulmonary hypertension on echocardiogram. He is concerned about inadequately treated OSA. She reports increased sleepiness over the past 6 months. She has found it increasingly difficult to tolerate her CPAP. She notes that she is under increased personal/family stressors over the past few months and consequently suffering insomnia. She has difficulty falling asleep and is awakening in the middle of the night frequently. She has lost 20 lbs of body weight since the initial diagnosis of OSA. She denies any other respiratory or chest symptoms. She also denies nasal symptoms of congestion or rhinorrhea  Past Medical History:  Diagnosis Date  . Depressive disorder, not elsewhere classified   . Diabetes mellitus without mention of complication   . Diaphragmatic hernia without mention of obstruction or gangrene   . Disorders of bursae and tendons in shoulder region, unspecified   . Diverticulosis of colon (without  mention of hemorrhage)   . Headache(784.0)   . Internal hemorrhoids without mention of complication   . Obesity, unspecified   . Other urinary incontinence   . Paroxysmal atrial fibrillation (HCC)   . Pure hypercholesterolemia     Past Surgical History:  Procedure Laterality Date  . ABDOMINAL HYSTERECTOMY  1972   Have both ovaries  . BREAST SURGERY     [place]  . CARDIAC CATHETERIZATION     [place]    . ELECTROPHYSIOLOGIC STUDY N/A 01/24/2016   Procedure: CARDIOVERSION;  Surgeon: [name], MD;  Location: [facility] ORS;  Service: Cardiovascular;  Laterality: N/A;  . TONSILLECTOMY      MEDICATIONS: I have reviewed all medications and confirmed regimen as documented  Social History   Social History  . Marital status: Married    Spouse name: N/A  . Number of children: 2  . Years of education: N/A   Occupational History  . 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 Smoking status: Never Smoker  . Smokeless tobacco: Never Used  . Alcohol use No  . Drug use: No  . Sexual activity: Not on file   Other Topics Concern  . Not on file   Social History Narrative   Exercise walks 3 times daily         Diet fruit, occ fast food, salads and lean meat    Family History  Problem Relation Age of Onset  . Cancer Mother     fullopian tube  . Dementia Mother   . Pulmonary embolism Mother   . Hyperlipidemia Mother   . Cancer Father     lung  .  Hyperlipidemia Father   . Hypertension Sister   . Diabetes Brother   . Stroke Paternal Grandmother   . Stroke Paternal Grandfather     ROS: No fever, myalgias/arthralgias, unexplained weight loss or weight gain No new focal weakness or sensory deficits No otalgia, hearing loss, visual changes, nasal and sinus symptoms, mouth and throat problems No neck pain or adenopathy No abdominal pain, N/V/D, diarrhea, change in bowel pattern No dysuria, change in urinary pattern   Vitals:    02/09/16 0910  BP: 122/80  Pulse: 65  SpO2: 98%  Weight: 190 lb (86.2 kg)     EXAM:  Gen: WDWN, No overt respiratory distress HEENT: NCAT, sclera white, oropharynx normal, nares patent and nasal mucosa normal Neck: Supple without LAN, thyromegaly, JVD Lungs: breath sounds full, no adventitious sounds Cardiovascular: RRR, no murmurs noted Abdomen: Soft, nontender, normal BS Ext: without clubbing, cyanosis, edema Neuro: CNs grossly intact, motor and sensory intact Skin: Limited exam, no lesions noted  DATA:   BMP Latest Ref Rng & Units 01/24/2016 01/22/2016 05/09/2015  Glucose 65 - 99 mg/dL 165(H) 131(H) 103(H)  BUN 6 - 20 mg/dL 15 19 19   Creatinine 0.44 - 1.00 mg/dL 0.92 1.12(H) 0.89  Sodium 135 - 145 mmol/L 138 136 140  Potassium 3.5 - 5.1 mmol/L 3.9 4.3 4.5  Chloride 101 - 111 mmol/L 102 104 104  CO2 22 - 32 mmol/L 28 26 27   Calcium 8.9 - 10.3 mg/dL 8.8(L) 8.7(L) 9.4    CBC Latest Ref Rng & Units 01/24/2016 01/23/2016 01/22/2016  WBC 3.6 - 11.0 K/uL 7.9 8.1 8.9  Hemoglobin 12.0 - 16.0 g/dL 9.8(L) 9.8(L) 9.9(L)  Hematocrit 35.0 - 47.0 % 29.5(L) 29.5(L) 29.9(L)  Platelets 150 - 440 K/uL 349 351 352    CXR (01/22/16):    IMPRESSION:     ICD-9-CM ICD-10-CM   1. OSA (obstructive sleep apnea) 327.23 G47.33   2. Insomnia, unspecified type 780.52 G47.00   3. Daytime sleepiness 780.54 R40.0   4. Pulmonary hypertension, moderate 416.8 I27.20    It seems unlikely that her OSA has worsened since the time of diagnosis as she has lost 20# since that time and her increased symptoms have developed in the past 6 months correlating with increased stressors in her life. I think her increased daytime sleepiness is likely more related to stress-induced insomnia. The pulmonary hypertension is likely class 2 (due to mitral valve disease and atrial fibrillation). Her sleep study in 2010 showed only very mild desaturation and is probably not a major contributor.    PLAN:  1) Continue CPAP @  current setting of 13 cm H2O 2) Trazodone 50-100 mg PO qHS PRN insomnia 3) ROV 4-6 weeks and depending on how she is doing, we will consider repeat sleep study vs autotitration study @ home 4) We should paln on repeating echocardiogram to re-assess PAPs in 3-6 months after she has been maintained in NSR for some period of time   Merton Border, MD PCCM service Mobile [phone] Pager [phone] 02/12/2016

## 2016-02-12 NOTE — Telephone Encounter (Signed)
Agree. Thanks

## 2016-02-23 ENCOUNTER — Encounter: Payer: Self-pay | Admitting: Internal Medicine

## 2016-02-23 ENCOUNTER — Ambulatory Visit (INDEPENDENT_AMBULATORY_CARE_PROVIDER_SITE_OTHER): Payer: 59 | Admitting: Internal Medicine

## 2016-02-23 VITALS — BP 153/68 | HR 79 | Wt 194.2 lb

## 2016-02-23 DIAGNOSIS — Z794 Long term (current) use of insulin: Secondary | ICD-10-CM | POA: Diagnosis not present

## 2016-02-23 DIAGNOSIS — E1165 Type 2 diabetes mellitus with hyperglycemia: Secondary | ICD-10-CM | POA: Diagnosis not present

## 2016-02-23 DIAGNOSIS — IMO0001 Reserved for inherently not codable concepts without codable children: Secondary | ICD-10-CM

## 2016-02-23 LAB — POCT GLYCOSYLATED HEMOGLOBIN (HGB A1C): HEMOGLOBIN A1C: 6.8

## 2016-02-23 MED ORDER — INSULIN GLARGINE 100 UNIT/ML SOLOSTAR PEN: 12.0000 [IU] | PEN_INJECTOR | Freq: Every day | SUBCUTANEOUS | 2 refills | Status: DC

## 2016-02-23 MED ORDER — GLUCOSE BLOOD VI STRP: ORAL_STRIP | 3 refills | Status: DC

## 2016-02-23 MED ORDER — LINAGLIPTIN 5 MG PO TABS
5.0000 mg | ORAL_TABLET | Freq: Every day | ORAL | 11 refills | Status: DC
Start: 2016-02-23 — End: 2017-02-11

## 2016-02-23 MED ORDER — METFORMIN HCL ER 500 MG PO TB24: 2000.0000 mg | ORAL_TABLET | Freq: Every day | ORAL | 3 refills | Status: DC

## 2016-02-23 MED ORDER — INSULIN PEN NEEDLE 31G X 8 MM MISC: 6 refills | Status: DC

## 2016-02-23 NOTE — Progress Notes (Signed)
Patient ID: [name], female   DOB: [birth year], 70 y.o.   MRN: [id number]  HPI: [name] is a 70 y.o.-year-old female, returning for f/u for DM2, dx 1995, insulin-dependent since 70, uncontrolled, without complications. Last visit 3 mo ago.   Since last visit, she developed Afib >> admitted with A fib with RVR before Thanksgiving >> had cardioversion.  She stopped going to the gym. She plans to go back after the Holidays.  Last hemoglobin A1c was: Lab Results  Component Value Date   HGBA1C 6.8 11/24/2015   HGBA1C 6.3 08/24/2015   HGBA1C 7.0 (H) 05/09/2015   Pt is on a regimen of: - Metformin XR 1000 mg 2x a day - Januvia 100 mg daily in am >> Tradjenta 5 mg daily before lunch - Lantus 22 >> 18 units in am >> Basaglar 12 >> 14 units in hs  Pt checks her sugars 2 a day and they are:  - am: 104-147, 155, 162 >> 90-133 >> 96-157, 189 >> 90-120, 150 - 2h after b'fast: 110-131, 308 x1 >> 117 >> 90-103 >> 113-141 >> n/c - before lunch:n/c >> 120, 147 >> n/c >> 106 >> 123-134 >> 80-125 - 2h after lunch: 116, 139 >> 110 >> 96, 124, 198 >> 97, 103, 203 >> 160-180 - before dinner: 82-91 >> 211, 209 >> n/c >> 102-144 >> n/c - 2h after dinner: 124 >> 165 >> 118, 167-225, 254 >> 139 >> 180s, 289 - stress, around her A fib. - bedtime: n/c >> 115-154 >> 120-144 >> 114, 228 >> n/c - nighttime: n/c No lows. Lowest sugar was 96 >> 90; she has hypoglycemia awareness at 87.  Highest sugar was 203 >> 300x1.  Pt's meals are: - Breakfast: cereals + 2% milk; bread + cheese; bread + PB - Lunch: sandwich; soup - Dinner: chicken; steak; seafood - Snacks: 2: PB crackers  - no CKD, last BUN/creatinine:  Lab Results  Component Value Date   BUN 15 01/24/2016   CREATININE 0.92 01/24/2016   - last set of lipids: Lab Results  Component Value Date   CHOL 145 05/09/2015   HDL 44.50 05/09/2015   LDLCALC 86 05/09/2015   LDLDIRECT 163.1 07/04/2011   TRIG 73.0 05/09/2015   CHOLHDL 3  05/09/2015  On Lipitor.  - last eye exam was in 05/2015 - [facility]. No DR. + cataracts. - no numbness and tingling in her feet.  I reviewed pt's medications, allergies, PMH, social hx, family hx, and changes were documented in the history of present illness. Otherwise, unchanged from my initial visit note.  ROS: Constitutional: no weight gain/loss, no fatigue, no subjective hyperthermia/hypothermia Eyes: no blurry vision, no xerophthalmia ENT: no sore throat, no nodules palpated in throat, no dysphagia/odynophagia, no hoarseness Cardiovascular: no CP/SOB/palpitations/no leg swelling Respiratory: no cough/SOB Gastrointestinal: no N/V/D/C Musculoskeletal: no muscle/joint aches Skin: no rashes Neurological: no tremors/numbness/tingling/dizziness  PE: BP (!) 153/68 (BP Location: Left Arm, Patient Position: Sitting, Cuff Size: Normal)   Pulse 79   Wt 194 lb 3.2 oz (88.1 kg)   LMP  (LMP Unknown)   SpO2 98%   BMI 31.83 kg/m  Body mass index is 31.83 kg/m. Wt Readings from Last 3 Encounters:  02/23/16 194 lb 3.2 oz (88.1 kg)  02/09/16 190 lb (86.2 kg)  02/05/16 194 lb (88 kg)   Constitutional: obese, in NAD Eyes: PERRLA, EOMI, no exophthalmos ENT: moist mucous membranes, no thyromegaly, no cervical lymphadenopathy Cardiovascular: RRR, No MRG Respiratory:  CTA B Gastrointestinal: abdomen soft, NT, ND, BS+ Musculoskeletal: no deformities, strength intact in all 4 Skin: moist, warm, no rashes Neurological: no tremor with outstretched hands, DTR normal in all 4  ASSESSMENT: 1. DM2, insulin-dependent, controlled, without long term complications, but with hyperglycemia - she was contemplating gastric sleeve sx >> on hold now  PLAN:  1. Patient with long-standing, now controlled diabetes, with improvement in control on basal insulin and oral antidiabetic regimen. As sugars were a little higher (dietary indiscretions) at last visit, we increased Basaglar a little. Sugars are better  before meals but they increase after meals as she stopped exercising 2/2 her A fib episode. She is now in sinus rhythm >> plans to return to the gym after the Holidays >> will not change her regimen.  - I suggested to:  Patient Instructions  Please continue; - Basaglar 14 units at bedtime.  - Metformin ER 1000 mg 2x a day - Tradjenta 5 mg before lunch  Please come back for a follow-up appointment in 3 months.  - continue checking sugars at different times of the day - check 1-2 times a day, rotating checks - advised for yearly eye exams, she is up to date - given  the flu shot at last visit - checked HbA1c today >> 6.8% (stable) - Return to clinic in 3 mo with sugar log   [name], MD PhD [facility] Endocrinology

## 2016-02-23 NOTE — Patient Instructions (Signed)
Please continue; - Basaglar 14 units at bedtime.  - Metformin ER 1000 mg 2x a day - Tradjenta 5 mg before lunch  Please come back for a follow-up appointment in 3 months.

## 2016-02-23 NOTE — Progress Notes (Signed)
Pre visit review using our clinic review tool, if applicable. No additional management support is needed unless otherwise documented below in the visit note. 

## 2016-02-28 ENCOUNTER — Telehealth: Payer: Self-pay | Admitting: Cardiovascular Disease

## 2016-02-28 NOTE — Telephone Encounter (Signed)
*  STAT* If patient is at the pharmacy, call can be transferred to refill team.   1. Which medications need to be refilled? (please list name of each medication and dose if known) diltiazem (CARDIZEM CD) 120 MG 24 hr capsule and flecainide (TAMBOCOR) 50 MG tablet   2. Which pharmacy/location (including street and city if local pharmacy) is medication to be sent to? [facility] on [address]  3. Do they need a 30 day or 90 day supply? 90 day

## 2016-02-28 NOTE — Telephone Encounter (Signed)
Pt requesting refill ok to refill?

## 2016-02-28 NOTE — Telephone Encounter (Signed)
Yes.  These were just prescribed by Rosaria Ferries, NP on 02/10/16, so I'm unsure why they are requesting refills.

## 2016-03-05 ENCOUNTER — Other Ambulatory Visit: Payer: Self-pay | Admitting: *Deleted

## 2016-03-05 MED ORDER — DILTIAZEM HCL ER COATED BEADS 120 MG PO CP24: 120.0000 mg | ORAL_CAPSULE | Freq: Every day | ORAL | 3 refills | Status: DC

## 2016-03-05 MED ORDER — FLECAINIDE ACETATE 50 MG PO TABS: 50.0000 mg | ORAL_TABLET | Freq: Two times a day (BID) | ORAL | 3 refills | Status: DC

## 2016-03-05 NOTE — Telephone Encounter (Signed)
Requested Prescriptions   Signed Prescriptions Disp Refills  . diltiazem (CARDIZEM CD) 120 MG 24 hr capsule 90 capsule 3    Sig: Take 1 capsule (120 mg total) by mouth daily.    Authorizing Provider: [name]    Ordering User: Britt Bottom flecainide (TAMBOCOR) 50 MG tablet 180 tablet 3    Sig: Take 1 tablet (50 mg total) by mouth every 12 (twelve) hours.    Authorizing Provider: [name]    Ordering User: Britt Bottom

## 2016-03-28 ENCOUNTER — Ambulatory Visit: Payer: 59 | Admitting: Pulmonary Disease

## 2016-03-29 ENCOUNTER — Encounter: Payer: Self-pay | Admitting: Pulmonary Disease

## 2016-03-29 ENCOUNTER — Ambulatory Visit (INDEPENDENT_AMBULATORY_CARE_PROVIDER_SITE_OTHER): Payer: 59 | Admitting: Pulmonary Disease

## 2016-03-29 VITALS — BP 136/80 | HR 65 | Wt 196.0 lb

## 2016-03-29 DIAGNOSIS — I2721 Secondary pulmonary arterial hypertension: Secondary | ICD-10-CM

## 2016-03-29 DIAGNOSIS — G4733 Obstructive sleep apnea (adult) (pediatric): Secondary | ICD-10-CM | POA: Diagnosis not present

## 2016-03-29 NOTE — Patient Instructions (Signed)
1) Continue Trazodone 50 mg nightly 2) We will arrange for autoset CPAP machine through [facility] 3) Follow up in 4 months with echocardiogram (to reassess blood pressure in lungs) prior to that visit

## 2016-03-31 NOTE — Progress Notes (Signed)
PULMONARY OFFICE FOLLOW UP NOTE  Requesting MD/Service: Fletcher Anon Date of initial consultation: 02/09/16 Reason for consultation: OSA  PT PROFILE: 71 y.o. F never smoker with history of OSA diagnosed 09/2008 referred by Dr Fletcher Anon for evaluation of persistent daytime hypersomnolence  DATA: Sleep study 09/14/08 [facility]): AHI 32/hr with predominance of hypopneas. SpO2 nadir 86%. Echocardiogram 10/08/13: Normal LVEF and diastolic function, Mild MVP, moderate MR, trivial TR,  RVSP not estimated  Echocardiogram 01/22/16: Atrial fibirllation, LVEF 55-60%, mild MR, LA moderately dilated, mild to moderate TR, RVSP estimate 47 mmHg  SUBJ:  Insomnia is much improved with trazodone - she is only taking 50 mg nightly with no adverse effects. She is compliant with CPAP but is still having trouble tolerating it. Continues to have some daytime hypersomnolence   Vitals:   03/29/16 0945  BP: 136/80  Pulse: 65  SpO2: 100%  Weight: 196 lb (88.9 kg)     EXAM:  Gen: WDWN, No overt respiratory distress HEENT: NCAT, sclera white, oropharynx normal Neck: Supple without LAN, thyromegaly, JVD Lungs: breath sounds full, no adventitious sounds Cardiovascular: RRR, no murmurs noted Abdomen: Soft, nontender, normal BS Ext: without clubbing, cyanosis, edema Neuro: CNs grossly intact, motor and sensory intact Skin: Limited exam, no lesions noted  DATA:   BMP Latest Ref Rng & Units 01/24/2016 01/22/2016 05/09/2015  Glucose 65 - 99 mg/dL 165(H) 131(H) 103(H)  BUN 6 - 20 mg/dL 15 19 19   Creatinine 0.44 - 1.00 mg/dL 0.92 1.12(H) 0.89  Sodium 135 - 145 mmol/L 138 136 140  Potassium 3.5 - 5.1 mmol/L 3.9 4.3 4.5  Chloride 101 - 111 mmol/L 102 104 104  CO2 22 - 32 mmol/L 28 26 27   Calcium 8.9 - 10.3 mg/dL 8.8(L) 8.7(L) 9.4    CBC Latest Ref Rng & Units 01/24/2016 01/23/2016 01/22/2016  WBC 3.6 - 11.0 K/uL 7.9 8.1 8.9  Hemoglobin 12.0 - 16.0 g/dL 9.8(L) 9.8(L) 9.9(L)  Hematocrit 35.0 - 47.0 % 29.5(L) 29.5(L)  29.9(L)  Platelets 150 - 440 K/uL 349 351 352    CXR: no new film  IMPRESSION:     ICD-9-CM ICD-10-CM   1. OSA (obstructive sleep apnea) 327.23 G47.33 Ambulatory Referral for DME  2. PAH (pulmonary artery hypertension) 416.8 I27.21 ECHOCARDIOGRAM COMPLETE  3. Insomnia - much improved with trazodone  She is having difficulty tolerating CPAP and will likely do better with an autoset machine  The PAH is mild to moderate by last echocardiogram and is felt to be due predominantly to L sided cardiac disease (AF and MR). She has now been cardioverted and is maintaining sinus rhythm. Hopefully, if she maintains NSR, the PH will improve.    PLAN:  1) We will arrange for autoset CPAP machine through [place] 2) Cont Trazodone 50 mg qHS PRN insomnia 3) ROV 4 months at which time, we will obtain a repeat echocardiogram to follow pulm htn   Merton Border, MD PCCM service Mobile [phone] Pager [phone] 03/31/2016

## 2016-04-09 DIAGNOSIS — G4733 Obstructive sleep apnea (adult) (pediatric): Secondary | ICD-10-CM | POA: Diagnosis not present

## 2016-04-09 DIAGNOSIS — G4730 Sleep apnea, unspecified: Secondary | ICD-10-CM | POA: Diagnosis not present

## 2016-05-01 ENCOUNTER — Telehealth: Payer: Self-pay | Admitting: Family Medicine

## 2016-05-01 DIAGNOSIS — IMO0001 Reserved for inherently not codable concepts without codable children: Secondary | ICD-10-CM

## 2016-05-01 DIAGNOSIS — Z794 Long term (current) use of insulin: Secondary | ICD-10-CM

## 2016-05-01 DIAGNOSIS — E7800 Pure hypercholesterolemia, unspecified: Secondary | ICD-10-CM

## 2016-05-01 DIAGNOSIS — E1165 Type 2 diabetes mellitus with hyperglycemia: Secondary | ICD-10-CM

## 2016-05-01 DIAGNOSIS — D649 Anemia, unspecified: Secondary | ICD-10-CM

## 2016-05-01 NOTE — Telephone Encounter (Signed)
-----   Message from [name] Sia sent at 04/26/2016  4:28 PM EST ----- Regarding: Lab orders for Friday, 3.9.18 Patient is scheduled for CPX labs, please order future labs, Thanks , Karna Christmas

## 2016-05-02 NOTE — Telephone Encounter (Signed)
Patient scheduled appointment w/ Katha Cabal and labs on 05/10/16 at 1:00.

## 2016-05-06 DIAGNOSIS — H524 Presbyopia: Secondary | ICD-10-CM | POA: Diagnosis not present

## 2016-05-06 DIAGNOSIS — E119 Type 2 diabetes mellitus without complications: Secondary | ICD-10-CM | POA: Diagnosis not present

## 2016-05-07 DIAGNOSIS — G4733 Obstructive sleep apnea (adult) (pediatric): Secondary | ICD-10-CM | POA: Diagnosis not present

## 2016-05-07 DIAGNOSIS — G4730 Sleep apnea, unspecified: Secondary | ICD-10-CM | POA: Diagnosis not present

## 2016-05-10 ENCOUNTER — Other Ambulatory Visit: Payer: 59

## 2016-05-10 ENCOUNTER — Ambulatory Visit (INDEPENDENT_AMBULATORY_CARE_PROVIDER_SITE_OTHER): Payer: 59

## 2016-05-10 VITALS — BP 122/76 | HR 115 | Temp 98.5°F | Wt 198.5 lb

## 2016-05-10 DIAGNOSIS — D649 Anemia, unspecified: Secondary | ICD-10-CM

## 2016-05-10 DIAGNOSIS — E7800 Pure hypercholesterolemia, unspecified: Secondary | ICD-10-CM

## 2016-05-10 DIAGNOSIS — Z0000 Encounter for general adult medical examination without abnormal findings: Secondary | ICD-10-CM | POA: Diagnosis not present

## 2016-05-10 LAB — CBC WITH DIFFERENTIAL/PLATELET
BASOS ABS: 0 10*3/uL (ref 0.0–0.1)
BASOS PCT: 0.2 % (ref 0.0–3.0)
EOS PCT: 1 % (ref 0.0–5.0)
Eosinophils Absolute: 0.1 10*3/uL (ref 0.0–0.7)
HCT: 31.7 % — ABNORMAL LOW (ref 36.0–46.0)
Hemoglobin: 10.3 g/dL — ABNORMAL LOW (ref 12.0–15.0)
LYMPHS ABS: 1.8 10*3/uL (ref 0.7–4.0)
Lymphocytes Relative: 25 % (ref 12.0–46.0)
MCHC: 32.4 g/dL (ref 30.0–36.0)
MCV: 84.7 fl (ref 78.0–100.0)
MONOS PCT: 7.2 % (ref 3.0–12.0)
Monocytes Absolute: 0.5 10*3/uL (ref 0.1–1.0)
NEUTROS ABS: 4.9 10*3/uL (ref 1.4–7.7)
NEUTROS PCT: 66.6 % (ref 43.0–77.0)
PLATELETS: 319 10*3/uL (ref 150.0–400.0)
RBC: 3.74 Mil/uL — ABNORMAL LOW (ref 3.87–5.11)
RDW: 15.7 % — AB (ref 11.5–15.5)
WBC: 7.3 10*3/uL (ref 4.0–10.5)

## 2016-05-10 LAB — COMPREHENSIVE METABOLIC PANEL
ALT: 14 U/L (ref 0–35)
AST: 13 U/L (ref 0–37)
Albumin: 4.1 g/dL (ref 3.5–5.2)
Alkaline Phosphatase: 57 U/L (ref 39–117)
BUN: 19 mg/dL (ref 6–23)
CO2: 29 meq/L (ref 19–32)
Calcium: 9.6 mg/dL (ref 8.4–10.5)
Chloride: 103 mEq/L (ref 96–112)
Creatinine, Ser: 1.03 mg/dL (ref 0.40–1.20)
GFR: 56.45 mL/min — ABNORMAL LOW (ref >60.00)
GLUCOSE: 82 mg/dL (ref 70–99)
Potassium: 4.6 mEq/L (ref 3.5–5.1)
SODIUM: 138 meq/L (ref 135–145)
Total Bilirubin: 0.3 mg/dL (ref 0.2–1.2)
Total Protein: 6.9 g/dL (ref 6.0–8.3)

## 2016-05-10 LAB — LIPID PANEL
CHOL/HDL RATIO: 3
Cholesterol: 133 mg/dL (ref 0–200)
HDL: 43.2 mg/dL (ref >39.00)
LDL Cholesterol: 74 mg/dL (ref 0–99)
NONHDL: 89.33
Triglycerides: 76 mg/dL (ref 0.0–149.0)
VLDL: 15.2 mg/dL (ref 0.0–40.0)

## 2016-05-10 NOTE — Progress Notes (Signed)
PCP notes:   Health maintenance:  Eye exam - per pt, completed in March 2018 Foot exam - per pt, completed in Dec 2017  Abnormal screenings:   Hearing - failed Mini-Cog score: 19/20  Patient concerns:   None  Nurse concerns:  None  Next PCP appt:   05/24/16 @ 0915

## 2016-05-10 NOTE — Patient Instructions (Signed)
[name] , Thank you for taking time to come for your Medicare Wellness Visit. I appreciate your ongoing commitment to your health goals. Please review the following plan we discussed and let me know if I can assist you in the future.   These are the goals we discussed: Goals    . Increase physical activity          Starting 05/10/2016, I will continue to walk at least 60 min 3 days per week.        This is a list of the screening recommended for you and due dates:  Health Maintenance  Topic Date Due  . Urine Protein Check  05/15/2016  . Stool Blood Test  05/24/2016  . Hemoglobin A1C  08/23/2016  . Pneumonia vaccines (2 of 2 - PPSV23) 01/16/2017  . Complete foot exam   02/28/2017  . Eye exam for diabetics  05/06/2017  . Mammogram  06/21/2017  . Tetanus Vaccine  04/10/2020  . Flu Shot  Completed  . DEXA scan (bone density measurement)  Completed  .  Hepatitis C: One time screening is recommended by Center for Disease Control  (CDC) for  adults born from [birth year] through 1963.   Completed   Preventive Care for Adults  A healthy lifestyle and preventive care can promote health and wellness. Preventive health guidelines for adults include the following key practices.  . A routine yearly physical is a good way to check with your health care provider about your health and preventive screening. It is a chance to share any concerns and updates on your health and to receive a thorough exam.  . Visit your dentist for a routine exam and preventive care every 6 months. Brush your teeth twice a day and floss once a day. Good oral hygiene prevents tooth decay and gum disease.  . The frequency of eye exams is based on your age, health, family medical history, use  of contact lenses, and other factors. Follow your health care provider's ecommendations for frequency of eye exams.  . Eat a healthy diet. Foods like vegetables, fruits, whole grains, low-fat dairy products, and lean protein foods  contain the nutrients you need without too many calories. Decrease your intake of foods high in solid fats, added sugars, and salt. Eat the right amount of calories for you. Get information about a proper diet from your health care provider, if necessary.  . Regular physical exercise is one of the most important things you can do for your health. Most adults should get at least 150 minutes of moderate-intensity exercise (any activity that increases your heart rate and causes you to sweat) each week. In addition, most adults need muscle-strengthening exercises on 2 or more days a week.  Silver Sneakers may be a benefit available to you. To determine eligibility, you may visit the website: www.silversneakers.com or contact program at [phone]) [phone] Mon-Fri between 8AM-8PM.   . Maintain a healthy weight. The body mass index (BMI) is a screening tool to identify possible weight problems. It provides an estimate of body fat based on height and weight. Your health care provider can find your BMI and can help you achieve or maintain a healthy weight.   For adults 20 years and older: ? A BMI below 18.5 is considered underweight. ? A BMI of 18.5 to 24.9 is normal. ? A BMI of 25 to 29.9 is considered overweight. ? A BMI of 30 and above is considered obese.   . Maintain normal blood lipids  and cholesterol levels by exercising and minimizing your intake of saturated fat. Eat a balanced diet with plenty of fruit and vegetables. Blood tests for lipids and cholesterol should begin at age 57 and be repeated every 5 years. If your lipid or cholesterol levels are high, you are over 50, or you are at high risk for heart disease, you may need your cholesterol levels checked more frequently. Ongoing high lipid and cholesterol levels should be treated with medicines if diet and exercise are not working.  . If you smoke, find out from your health care provider how to quit. If you do not use tobacco, please do not  start.  . If you choose to drink alcohol, please do not consume more than 2 drinks per day. One drink is considered to be 12 ounces (355 mL) of beer, 5 ounces (148 mL) of wine, or 1.5 ounces (44 mL) of liquor.  . If you are 56-19 years old, ask your health care provider if you should take aspirin to prevent strokes.  . Use sunscreen. Apply sunscreen liberally and repeatedly throughout the day. You should seek shade when your shadow is shorter than you. Protect yourself by wearing long sleeves, pants, a wide-brimmed hat, and sunglasses year round, whenever you are outdoors.  . Once a month, do a whole body skin exam, using a mirror to look at the skin on your back. Tell your health care provider of new moles, moles that have irregular borders, moles that are larger than a pencil eraser, or moles that have changed in shape or color.

## 2016-05-10 NOTE — Progress Notes (Signed)
Subjective:   [name] is a 71 y.o. female who presents for Medicare Annual (Subsequent) preventive examination.  Review of Systems:  N/A Cardiac Risk Factors include: advanced age (>83men, >67 women);obesity (BMI >30kg/m2);diabetes mellitus;hypertension;dyslipidemia     Objective:     Vitals: BP 122/76 (BP Location: Left Arm, Patient Position: Sitting, Cuff Size: Normal)   Pulse (!) 115   Temp 98.5 F (36.9 C) (Oral)   Wt 198 lb 8 oz (90 kg)   LMP  (LMP Unknown)   SpO2 99%   BMI 32.53 kg/m   Body mass index is 32.53 kg/m.   Tobacco History  Smoking Status  . Never Smoker  Smokeless Tobacco  . Never Used     Counseling given: No   Past Medical History:  Diagnosis Date  . Depressive disorder, not elsewhere classified   . Diabetes mellitus without mention of complication   . Diaphragmatic hernia without mention of obstruction or gangrene   . Disorders of bursae and tendons in shoulder region, unspecified   . Diverticulosis of colon (without mention of hemorrhage)   . Headache(784.0)   . Internal hemorrhoids without mention of complication   . Obesity, unspecified   . Other urinary incontinence   . Paroxysmal atrial fibrillation (HCC)   . Pure hypercholesterolemia    Past Surgical History:  Procedure Laterality Date  . ABDOMINAL HYSTERECTOMY  1972   Have both ovaries  . BREAST SURGERY     [place]  . CARDIAC CATHETERIZATION     [place]    . ELECTROPHYSIOLOGIC STUDY N/A 01/24/2016   Procedure: CARDIOVERSION;  Surgeon: [name], MD;  Location: [facility] ORS;  Service: Cardiovascular;  Laterality: N/A;  . TONSILLECTOMY     Family History  Problem Relation Age of Onset  . Cancer Mother     fullopian tube  . Dementia Mother   . Pulmonary embolism Mother   . Hyperlipidemia Mother   . Cancer Father     lung  . Hyperlipidemia Father   . Hypertension Sister   . Diabetes Brother   . Stroke Paternal Grandmother   . Stroke Paternal  Grandfather    History  Sexual Activity  . Sexual activity: Yes    Outpatient Encounter Prescriptions as of 05/10/2016  Medication Sig  . atorvastatin (LIPITOR) 10 MG tablet TAKE ONE TABLET BY MOUTH EVERY NIGHT AT BEDTIME  . Calcium Carb-Ergocalciferol 500-200 MG-UNIT TABS Take by mouth.  . diltiazem (CARDIZEM CD) 120 MG 24 hr capsule Take 1 capsule (120 mg total) by mouth daily.  [name] ELIQUIS 5 MG TABS tablet TAKE 1 TABLET BY MOUTH TWICE A DAY  . escitalopram (LEXAPRO) 10 MG tablet TAKE ONE TABLET BY MOUTH ONCE DAILY  . flecainide (TAMBOCOR) 50 MG tablet Take 1 tablet (50 mg total) by mouth every 12 (twelve) hours.  [name] glucose blood (ONE TOUCH ULTRA TEST) test strip CHECK BLOOD SUGAR 2 TIMES A DAY AS DIRECTED  . Insulin Glargine (LANTUS SOLOSTAR) 100 UNIT/ML Solostar Pen Inject 12-14 Units into the skin daily at 10 pm.  . Insulin Pen Needle (B-D ULTRAFINE III SHORT PEN) 31G X 8 MM MISC Use as directed Dx:250.00  . linagliptin (TRADJENTA) 5 MG TABS tablet Take 1 tablet (5 mg total) by mouth daily.  . metFORMIN (GLUCOPHAGE-XR) 500 MG 24 hr tablet Take 4 tablets (2,000 mg total) by mouth daily with supper.  . Multiple Vitamin (MULTIVITAMIN) tablet Take 1 tablet by mouth daily.    . ranitidine (ZANTAC)  150 MG tablet Take 150 mg by mouth daily.    . traZODone (DESYREL) 50 MG tablet Take 1-2 tablets (50-100 mg total) by mouth at bedtime as needed for sleep.   No facility-administered encounter medications on file as of 05/10/2016.     Activities of Daily Living In your present state of health, do you have any difficulty performing the following activities: 05/10/2016 01/22/2016  Hearing? [place]? Y -  Difficulty concentrating or making decisions? Y -  Walking or climbing stairs? N -  Dressing or bathing? N -  Doing errands, shopping? N N  Preparing Food and eating ? N -  Using the Toilet? N -  In the past six months, have you accidently leaked urine? N -  Do you have problems with loss of  bowel control? N -  Managing your Medications? N -  Managing your Finances? N -  Housekeeping or managing your Housekeeping? N -  Some recent data might be hidden    Patient Care Team: Jinny Sanders, MD as PCP - General [name], MD as Consulting Physician (Internal Medicine) [name], MD as Consulting Physician (Cardiology) Wilhelmina Mcardle, MD as Consulting Physician (Pulmonary Disease)    Assessment:     Hearing Screening   125Hz  250Hz  500Hz  1000Hz  2000Hz  3000Hz  4000Hz  6000Hz  8000Hz   Right ear:   40 40 40  0    Left ear:   40 40 40  0    Vision Screening Comments: Last vision exam in March 2018   Exercise Activities and Dietary recommendations Current Exercise Habits: Home exercise routine, Type of exercise: walking, Time (Minutes): 60, Frequency (Times/Week): 3, Weekly Exercise (Minutes/Week): 180, Intensity: Moderate, Exercise limited by: None identified  Goals    . Increase physical activity          Starting 05/10/2016, I will continue to walk at least 60 min 3 days per week.       Fall Risk Fall Risk  05/10/2016 05/16/2015 05/13/2014 05/11/2013  Falls in the past year? No No No No   Depression Screen PHQ 2/9 Scores 05/10/2016 05/16/2015 05/13/2014 05/11/2013  PHQ - 2 Score 0 0 0 0     Cognitive Function MMSE - Mini Mental State Exam 05/10/2016  Orientation to time 5  Orientation to Place 5  Registration 3  Attention/ Calculation 0  Recall 2  Recall-comments pt was unable to recall 1 of 3 words  Language- name 2 objects 0  Language- repeat 1  Language- follow 3 step command 3  Language- read & follow direction 0  Write a sentence 0  Copy design 0  Total score 19     PLEASE NOTE: A Mini-Cog screen was completed. Maximum score is 20. A value of 0 denotes this part of Folstein MMSE was not completed or the patient failed this part of the Mini-Cog screening.   Mini-Cog Screening Orientation to Time - Max 5 pts Orientation to Place - Max 5  pts Registration - Max 3 pts Recall - Max 3 pts Language Repeat - Max 1 pts Language Follow 3 Step Command - Max 3 pts    Immunization History  Administered Date(s) Administered  . Influenza Split 01/03/2011, 01/17/2012  . Influenza Whole 11/25/2007, 01/18/2009, 11/28/2009  . Influenza,inj,Quad PF,36+ Mos 01/06/2013, 12/07/2013, 12/14/2014, 11/24/2015  . Pneumococcal Conjugate-13 05/11/2013  . Pneumococcal Polysaccharide-23 01/17/2012  . Td 04/10/2010  . Zoster 05/13/2014   Screening Tests Health Maintenance  Topic Date Due  .  URINE MICROALBUMIN  05/15/2016  . COLON CANCER SCREENING ANNUAL FOBT  05/24/2016  . HEMOGLOBIN A1C  08/23/2016  . PNA vac Low Risk Adult (2 of 2 - PPSV23) 01/16/2017  . FOOT EXAM  02/28/2017  . OPHTHALMOLOGY EXAM  05/06/2017  . MAMMOGRAM  06/21/2017  . TETANUS/TDAP  04/10/2020  . INFLUENZA VACCINE  Completed  . DEXA SCAN  Completed  . Hepatitis C Screening  Completed      Plan:     I have personally reviewed and addressed the Medicare Annual Wellness questionnaire and have noted the following in the patient's chart:  A. Medical and social history B. Use of alcohol, tobacco or illicit drugs  C. Current medications and supplements D. Functional ability and status E.  Nutritional status F.  Physical activity G. Advance directives H. List of other physicians I.  Hospitalizations, surgeries, and ER visits in previous 12 months J.  Nathalie to include hearing, vision, cognitive, depression L. Referrals and appointments - none  In addition, I have reviewed and discussed with patient certain preventive protocols, quality metrics, and best practice recommendations. A written personalized care plan for preventive services as well as general preventive health recommendations were provided to patient.  See attached scanned questionnaire for additional information.   Signed,   Lindell Noe, MHA, BS, LPN Health Coach

## 2016-05-10 NOTE — Progress Notes (Signed)
Pre visit review using our clinic review tool, if applicable. No additional management support is needed unless otherwise documented below in the visit note. 

## 2016-05-10 NOTE — Progress Notes (Signed)
I reviewed health advisor's note, was available for consultation, and agree with documentation and plan.  

## 2016-05-13 DIAGNOSIS — H2523 Age-related cataract, morgagnian type, bilateral: Secondary | ICD-10-CM | POA: Diagnosis not present

## 2016-05-13 DIAGNOSIS — Q141 Congenital malformation of retina: Secondary | ICD-10-CM | POA: Diagnosis not present

## 2016-05-15 ENCOUNTER — Other Ambulatory Visit: Payer: Self-pay | Admitting: Family Medicine

## 2016-05-15 DIAGNOSIS — Z1231 Encounter for screening mammogram for malignant neoplasm of breast: Secondary | ICD-10-CM

## 2016-05-17 ENCOUNTER — Encounter: Payer: 59 | Admitting: Family Medicine

## 2016-05-21 ENCOUNTER — Telehealth: Payer: Self-pay | Admitting: Cardiovascular Disease

## 2016-05-21 NOTE — Telephone Encounter (Signed)
Pt w/successful DCCV Nov 2017 reports resting HR 107-127 over past few days; feels she may be back in afib.  Denies SOB. BP 120s/70s. Diltiazem was decreased from 180mg  to 120mg  in December after pt reported low BP. She continues to take flecainide 50mg  BID. Pt agreeable to come in tomorrow for an EKG. Routed to MD for further review and advice.

## 2016-05-21 NOTE — Telephone Encounter (Signed)
Patient has hx of afib and hr has been trending 109-127 in the last few days and she feels like something is going on doesn't feel right.  Please call.

## 2016-05-22 ENCOUNTER — Ambulatory Visit (INDEPENDENT_AMBULATORY_CARE_PROVIDER_SITE_OTHER): Payer: Medicare HMO

## 2016-05-22 ENCOUNTER — Other Ambulatory Visit: Payer: Self-pay

## 2016-05-22 ENCOUNTER — Encounter: Payer: Self-pay | Admitting: Cardiovascular Disease

## 2016-05-22 VITALS — BP 117/60 | HR 132 | Resp 16

## 2016-05-22 DIAGNOSIS — I480 Paroxysmal atrial fibrillation: Secondary | ICD-10-CM | POA: Diagnosis not present

## 2016-05-22 MED ORDER — DILTIAZEM HCL ER COATED BEADS 180 MG PO CP24: 180.0000 mg | ORAL_CAPSULE | Freq: Every day | ORAL | 3 refills | Status: DC

## 2016-05-22 NOTE — Progress Notes (Signed)
1.) Reason for visit: EKG  2.) Name of MD requesting visit: Nurse visit from phone call  3).  H&P: Pt has hx of afib. Successful DCCV November 2017. Takes flecainide 50mg  and cardizem 120mg  which was reduced from 180mg  in December d/t hypotension.         4).ROS related to problem: Reports HR 107-127 over the past three days. Denies SOB  even with exertion or dizziness. No complaints at this time.  EKG shows afib RVR.  BP 117/60, 98% on room air, respirations 16.    5). Assessment and plan per MD:  Reviewed with Dr. Saunders Revel. Per MD, increase flecainide to 100mg  BID; increase cardizem to 180mg  once daily. She will need to see Dr. Fletcher Anon or Christell Faith, PA-C Friday, March 23 for repeat EKG and office visit.   Discussed with patient and spouse who are agreeable with plan.

## 2016-05-22 NOTE — Patient Instructions (Addendum)
Per Dr. Saunders Revel, please increase flecainide to 100mg  twice daily and increase diltiazem to 180mg  once daily. Continue to monitor your heart rate and blood pressure. Follow up with Dr. Fletcher Anon or Christell Faith, PA-C on Friday, March 23

## 2016-05-22 NOTE — Patient Instructions (Signed)
Per Dr. Saunders Revel, please increase flecainide to 100mg  twice daily and cardizem to 180mg  once daily. Continue to monitor your heart rate and blood pressure. Please follow up with Dr. Fletcher Anon or Christell Faith, PA on Friday, March 23

## 2016-05-22 NOTE — Telephone Encounter (Signed)
EKG this morning with medication change per Dr. Saunders Revel. See nurse visit documentation. OV scheduled with Dr. Fletcher Anon March 23, 3:30pm

## 2016-05-23 ENCOUNTER — Encounter: Payer: Self-pay | Admitting: Internal Medicine

## 2016-05-23 ENCOUNTER — Ambulatory Visit (INDEPENDENT_AMBULATORY_CARE_PROVIDER_SITE_OTHER): Payer: 59 | Admitting: Internal Medicine

## 2016-05-23 VITALS — BP 122/74 | HR 90 | Ht 67.0 in | Wt 199.0 lb

## 2016-05-23 DIAGNOSIS — I4891 Unspecified atrial fibrillation: Secondary | ICD-10-CM

## 2016-05-23 DIAGNOSIS — Z794 Long term (current) use of insulin: Secondary | ICD-10-CM | POA: Diagnosis not present

## 2016-05-23 DIAGNOSIS — E1165 Type 2 diabetes mellitus with hyperglycemia: Secondary | ICD-10-CM

## 2016-05-23 DIAGNOSIS — IMO0001 Reserved for inherently not codable concepts without codable children: Secondary | ICD-10-CM

## 2016-05-23 LAB — POCT GLYCOSYLATED HEMOGLOBIN (HGB A1C): Hemoglobin A1C: 6.9

## 2016-05-23 NOTE — Addendum Note (Signed)
Addended by: Caprice Beaver T on: 05/23/2016 09:41 AM   Modules accepted: Orders

## 2016-05-23 NOTE — Progress Notes (Signed)
Patient ID: [name], female   DOB: Oct 11, 1945, 71 y.o.   MRN: [id number]  HPI: [name] is a 71 y.o.-year-old femalear-old female, returning for f/u for DM2, dx 1995, insulin-dependent since 2014, uncontrolled, without complications. Last visit 3 mo ago.   She developed Afib >> admitted with A fib with RVR in 01/2016 >> had cardioversion. Sugars were higher after this episode as she also stopped going to the gym. She saw her cardiologist (Dr. Fletcher Anon) yesterday >> again in Afib >> her med doses were increased. Pulse was 135 at home before this visit. Today, Pulse 90.   She did restart exercise 2x a week since last visit. She walks on the treadmill @2 -3 mph for 20-30 min.  Last hemoglobin A1c was: Lab Results  Component Value Date   HGBA1C 6.8 02/23/2016   HGBA1C 6.8 11/24/2015   HGBA1C 6.3 08/24/2015   Pt is on a regimen of: - Metformin XR 1000 mg 2x a day - Januvia 100 mg daily in am >> Tradjenta 5 mg daily before lunch - Lantus 22 >> 18 units in am >> Basaglar 12 >> 14 units in hs  Pt checks her sugars 2 a day and they are:  - am: 104-147, 155, 162 >> 90-133 >> 96-157, 189 >> 90-120, 150 >> 90-120s - 2h after b'fast: 110-131, 308 x1 >> 117 >> 90-103 >> 113-141 >> n/c - before lunch:n/c >> 120, 147 >> n/c >> 106 >> 123-134 >> 80-125 >> 90-130 - 2h after lunch: 116, 139 >> 110 >> 96, 124, 198 >> 97, 103, 203 >> 160-180 >> 140-160s - before dinner: 82-91 >> 211, 209 >> n/c >> 102-144 >> n/c - 2h after dinner: 165 >> 118, 167-225, 254 >> 139 >> 180s, 289 - stress, around her A fib episode >> n/c - bedtime: n/c >> 115-154 >> 120-144 >> 114, 228 >> n/c >> 120-160 - nighttime: n/c No lows. Lowest sugar was 96 >> 90 >> 90s; she has hypoglycemia awareness at 87.  Highest sugar was 203 >> 300x1 >> 180  Pt's meals are: - Breakfast: cereals + 2% milk; bread + cheese; bread + PB - Lunch: sandwich; soup - Dinner: chicken; steak; seafood - Snacks: 2: PB crackers  - no CKD, last BUN/creatinine:   Lab Results  Component Value Date   BUN 19 05/10/2016   CREATININE 1.03 05/10/2016   - last set of lipids: Lab Results  Component Value Date   CHOL 133 05/10/2016   HDL 43.20 05/10/2016   LDLCALC 74 05/10/2016   LDLDIRECT 163.1 07/04/2011   TRIG 76.0 05/10/2016   CHOLHDL 3 05/10/2016  On Lipitor.  - last eye exam was in 05/2016 - [place]. No DR. + cataracts. She has surgery coming up in 04 and 07/2016 - no numbness and tingling in her feet.  I reviewed pt's medications, allergies, PMH, social hx, family hx, and changes were documented in the history of present illness. Otherwise, unchanged from my initial visit note.  ROS: Constitutional: no weight gain/loss, no fatigue, no subjective hyperthermia/hypothermia Eyes: no blurry vision, no xerophthalmia ENT: no sore throat, no nodules palpated in throat, no dysphagia/odynophagia, no hoarseness Cardiovascular: no CP/SOB/+ palpitations/no leg swelling Respiratory: no cough/SOB Gastrointestinal: no N/V/D/C Musculoskeletal: no muscle/joint aches Skin: no rashes Neurological: no tremors/numbness/tingling/dizziness  PE: BP 122/74 (BP Location: Left Arm, Patient Position: Sitting)   Pulse 90   Ht 5\' 7" (1.702 m)   Wt 199 lb (90.3 kg)   LMP  (LMP Unknown)  SpO2 98%   BMI 31.17 kg/m  Body mass index is 31.17 kg/m. Wt Readings from Last 3 Encounters:  05/23/16 199 lb (90.3 kg)  05/10/16 198 lb 8 oz (90 kg)  03/29/16 196 lb (88.9 kg)   Constitutional: obese, in NAD Eyes: PERRLA, EOMI, no exophthalmos ENT: moist mucous membranes, no thyromegaly, no cervical lymphadenopathy Cardiovascular: RRR, No MRG Respiratory: CTA B Gastrointestinal: abdomen soft, NT, ND, BS+ Musculoskeletal: no deformities, strength intact in all 4 Skin: moist, warm, no rashes Neurological: no tremor with outstretched hands, DTR normal in all 4  ASSESSMENT: 1. DM2, insulin-dependent, controlled, without long term complications, but with  hyperglycemia - she was contemplating gastric sleeve sx >> on hold now  PLAN:  1. Patient with long-standing, now controlled diabetes, with improvement in control on basal insulin and oral antidiabetic regimen. Her sugars are approx same as at last visit. We discussed about continuing exercise, but also discussed about max heart rate during exercise and I advised her not to exceed 110 bpm. As of now, she is sometimes in the 140s. I also encouraged her to ask Dr. Fletcher Anon about this. - I suggested to:  Patient Instructions  Please continue; - Basaglar 14 units at bedtime.  - Metformin ER 1000 mg 2x a day - Tradjenta 5 mg before lunch  Please come back for a follow-up appointment in 3 months.  - continue checking sugars at different times of the day - check 1-2 times a day, rotating checks - advised for yearly eye exams, she is up to date  - given  the flu shot this season - checked HbA1c today >> 6.9% (stable) - Return to clinic in 3 mo with sugar log   [name], MD PhD [facility] Endocrinology, dx 1995, insulin-dependent since 2014, uncontrolled, without complications. Last visit 3 mo ago.   She developed Afib >> admitted with A fib with RVR in 01/2016 >> had cardioversion. Sugars were higher after this episode as she also stopped going to the gym. She saw her cardiologist (Dr. Fletcher Anon) yesterday >> again in Afib >> her med doses were increased. Pulse was 135 at home before this visit. Today, Pulse 90.   She did restart exercise 2x a week since last visit. She walks on the treadmill @2 -3 mph for 20-30 min.  Last hemoglobin A1c was: Lab Results  Component Value Date   HGBA1C 6.8 02/23/2016   HGBA1C 6.8 11/24/2015   HGBA1C 6.3 08/24/2015   Pt is on a regimen of: - Metformin XR 1000 mg 2x a day - Januvia 100 mg daily in am >> Tradjenta 5 mg daily before lunch - Lantus 22 >> 18 units in am >> Basaglar 12 >> 14 units in hs  Pt checks her sugars 2 a day and they are:  - am: 104-147, 155, 162 >> 90-133 >> 96-157, 189 >> 90-120, 150 >> 90-120s - 2h after b'fast: 110-131, 308 x1 >> 117 >> 90-103 >> 113-141 >> n/c - before lunch:n/c >> 120, 147 >> n/c >> 106 >> 123-134 >> 80-125 >> 90-130 - 2h after lunch: 116, 139 >> 110 >> 96, 124, 198 >> 97, 103, 203 >> 160-180 >> 140-160s - before dinner: 82-91 >> 211, 209 >> n/c >> 102-144 >> n/c - 2h after dinner: 165 >> 118, 167-225, 254 >> 139 >> 180s, 289 - stress, around her A fib episode >> n/c - bedtime: n/c >> 115-154 >> 120-144 >> 114, 228 >> n/c >> 120-160 - nighttime: n/c No lows. Lowest sugar was 96 >> 90 >> 90s; she has hypoglycemia awareness at 87.  Highest sugar was 203 >> 300x1 >> 180  Pt's meals are: - Breakfast: cereals + 2% milk; bread + cheese; bread + PB - Lunch: sandwich; soup - Dinner: chicken; steak; seafood - Snacks: 2: PB crackers  - no CKD, last BUN/creatinine:   Lab Results  Component Value Date   BUN 19 05/10/2016   CREATININE 1.03 05/10/2016   - last set of lipids: Lab Results  Component Value Date   CHOL 133 05/10/2016   HDL 43.20 05/10/2016   LDLCALC 74 05/10/2016   LDLDIRECT 163.1 07/04/2011   TRIG 76.0 05/10/2016   CHOLHDL 3 05/10/2016  On Lipitor.  - last eye exam was in 05/2016 - [place]. No DR. + cataracts. She has surgery coming up in 04 and 07/2016 - no numbness and tingling in her feet.  I reviewed pt's medications, allergies, PMH, social hx, family hx, and changes were documented in the history of present illness. Otherwise, unchanged from my initial visit note.  ROS: Constitutional: no weight gain/loss, no fatigue, no subjective hyperthermia/hypothermia Eyes: no blurry vision, no xerophthalmia ENT: no sore throat, no nodules palpated in throat, no dysphagia/odynophagia, no hoarseness Cardiovascular: no CP/SOB/+ palpitations/no leg swelling Respiratory: no cough/SOB Gastrointestinal: no N/V/D/C Musculoskeletal: no muscle/joint aches Skin: no rashes Neurological: no tremors/numbness/tingling/dizziness  PE: BP 122/74 (BP Location: Left Arm, Patient Position: Sitting)   Pulse 90   Ht 5\' 7"  (1.702 m)   Wt 199 lb (90.3 kg)   LMP  (LMP Unknown)  SpO2 98%   BMI 31.17 kg/m  Body mass index is 31.17 kg/m. Wt Readings from Last 3 Encounters:  05/23/16 199 lb (90.3 kg)  05/10/16 198 lb 8 oz (90 kg)  03/29/16 196 lb (88.9 kg)   Constitutional: obese, in NAD Eyes: PERRLA, EOMI, no exophthalmos ENT: moist mucous membranes, no thyromegaly, no cervical lymphadenopathy Cardiovascular: RRR, No MRG Respiratory: CTA B Gastrointestinal: abdomen soft, NT, ND, BS+ Musculoskeletal: no deformities, strength intact in all 4 Skin: moist, warm, no rashes Neurological: no tremor with outstretched hands, DTR normal in all 4  ASSESSMENT: 1. DM2, insulin-dependent, controlled, without long term complications, but with  hyperglycemia - she was contemplating gastric sleeve sx >> on hold now  PLAN:  1. Patient with long-standing, now controlled diabetes, with improvement in control on basal insulin and oral antidiabetic regimen. Her sugars are approx same as at last visit. We discussed about continuing exercise, but also discussed about max heart rate during exercise and I advised her not to exceed 110 bpm. As of now, she is sometimes in the 140s. I also encouraged her to ask Dr. Fletcher Anon about this. - I suggested to:  Patient Instructions  Please continue; - Basaglar 14 units at bedtime.  - Metformin ER 1000 mg 2x a day - Tradjenta 5 mg before lunch  Please come back for a follow-up appointment in 3 months.  - continue checking sugars at different times of the day - check 1-2 times a day, rotating checks - advised for yearly eye exams, she is up to date  - given  the flu shot this season - checked HbA1c today >> 6.9% (stable) - Return to clinic in 3 mo with sugar log   [name], MD PhD [facility] Endocrinology

## 2016-05-23 NOTE — Patient Instructions (Addendum)
Please continue; - Basaglar 14 units at bedtime.  - Metformin ER 1000 mg 2x a day - Tradjenta 5 mg before lunch  Decrease exercise intensity if your pulse >110.  Please come back for a follow-up appointment in 3 months.

## 2016-05-24 ENCOUNTER — Telehealth: Payer: Self-pay | Admitting: *Deleted

## 2016-05-24 ENCOUNTER — Encounter: Payer: Self-pay | Admitting: Cardiovascular Disease

## 2016-05-24 ENCOUNTER — Encounter: Payer: Self-pay | Admitting: Family Medicine

## 2016-05-24 ENCOUNTER — Ambulatory Visit (INDEPENDENT_AMBULATORY_CARE_PROVIDER_SITE_OTHER): Payer: 59 | Admitting: Cardiovascular Disease

## 2016-05-24 ENCOUNTER — Ambulatory Visit (INDEPENDENT_AMBULATORY_CARE_PROVIDER_SITE_OTHER): Payer: 59 | Admitting: Family Medicine

## 2016-05-24 VITALS — BP 120/70 | HR 99 | Ht 65.5 in | Wt 200.0 lb

## 2016-05-24 VITALS — BP 114/70 | HR 111 | Temp 98.2°F | Ht 65.5 in | Wt 199.2 lb

## 2016-05-24 DIAGNOSIS — Z8041 Family history of malignant neoplasm of ovary: Secondary | ICD-10-CM | POA: Diagnosis not present

## 2016-05-24 DIAGNOSIS — E1165 Type 2 diabetes mellitus with hyperglycemia: Secondary | ICD-10-CM

## 2016-05-24 DIAGNOSIS — E785 Hyperlipidemia, unspecified: Secondary | ICD-10-CM | POA: Diagnosis not present

## 2016-05-24 DIAGNOSIS — I10 Essential (primary) hypertension: Secondary | ICD-10-CM

## 2016-05-24 DIAGNOSIS — D649 Anemia, unspecified: Secondary | ICD-10-CM | POA: Diagnosis not present

## 2016-05-24 DIAGNOSIS — I4891 Unspecified atrial fibrillation: Secondary | ICD-10-CM

## 2016-05-24 DIAGNOSIS — E7800 Pure hypercholesterolemia, unspecified: Secondary | ICD-10-CM | POA: Diagnosis not present

## 2016-05-24 DIAGNOSIS — Z794 Long term (current) use of insulin: Secondary | ICD-10-CM | POA: Diagnosis not present

## 2016-05-24 DIAGNOSIS — E119 Type 2 diabetes mellitus without complications: Secondary | ICD-10-CM | POA: Diagnosis not present

## 2016-05-24 DIAGNOSIS — I340 Nonrheumatic mitral (valve) insufficiency: Secondary | ICD-10-CM

## 2016-05-24 DIAGNOSIS — I4819 Other persistent atrial fibrillation: Secondary | ICD-10-CM

## 2016-05-24 DIAGNOSIS — Z0000 Encounter for general adult medical examination without abnormal findings: Secondary | ICD-10-CM

## 2016-05-24 DIAGNOSIS — F325 Major depressive disorder, single episode, in full remission: Secondary | ICD-10-CM

## 2016-05-24 DIAGNOSIS — R69 Illness, unspecified: Secondary | ICD-10-CM | POA: Diagnosis not present

## 2016-05-24 DIAGNOSIS — I481 Persistent atrial fibrillation: Secondary | ICD-10-CM

## 2016-05-24 LAB — CBC WITH DIFFERENTIAL/PLATELET
BASOS ABS: 0 10*3/uL (ref 0.0–0.1)
Basophils Relative: 0.5 % (ref 0.0–3.0)
EOS ABS: 0.1 10*3/uL (ref 0.0–0.7)
Eosinophils Relative: 1.4 % (ref 0.0–5.0)
HEMATOCRIT: 33.3 % — AB (ref 36.0–46.0)
HEMOGLOBIN: 10.8 g/dL — AB (ref 12.0–15.0)
LYMPHS PCT: 25.5 % (ref 12.0–46.0)
Lymphs Abs: 1.8 10*3/uL (ref 0.7–4.0)
MCHC: 32.5 g/dL (ref 30.0–36.0)
MCV: 84.2 fl (ref 78.0–100.0)
MONOS PCT: 6.5 % (ref 3.0–12.0)
Monocytes Absolute: 0.5 10*3/uL (ref 0.1–1.0)
NEUTROS ABS: 4.7 10*3/uL (ref 1.4–7.7)
Neutrophils Relative %: 66.1 % (ref 43.0–77.0)
PLATELETS: 342 10*3/uL (ref 150.0–400.0)
RBC: 3.95 Mil/uL (ref 3.87–5.11)
RDW: 15.1 % (ref 11.5–15.5)
WBC: 7.2 10*3/uL (ref 4.0–10.5)

## 2016-05-24 LAB — VITAMIN B12: VITAMIN B 12: 398 pg/mL (ref 211–911)

## 2016-05-24 LAB — FERRITIN: FERRITIN: 5.3 ng/mL — AB (ref 10.0–291.0)

## 2016-05-24 LAB — IBC PANEL
Iron: 57 ug/dL (ref 42–145)
SATURATION RATIOS: 12.1 % — AB (ref 20.0–50.0)
TRANSFERRIN: 336 mg/dL (ref 212.0–360.0)

## 2016-05-24 MED ORDER — FLECAINIDE ACETATE 100 MG PO TABS: 100.0000 mg | ORAL_TABLET | Freq: Two times a day (BID) | ORAL | 5 refills | Status: DC

## 2016-05-24 MED ORDER — FERROUS SULFATE 325 (65 FE) MG PO TABS: 325.0000 mg | ORAL_TABLET | Freq: Every day | ORAL | 3 refills | Status: DC

## 2016-05-24 NOTE — Assessment & Plan Note (Signed)
Well controlled. Continue current medication.  

## 2016-05-24 NOTE — Assessment & Plan Note (Signed)
Good control off medication. 

## 2016-05-24 NOTE — Assessment & Plan Note (Signed)
Unclear cause.. No blood loss known. ? Chronic disease. Eval with iron panel.

## 2016-05-24 NOTE — Patient Instructions (Addendum)
Please stop at the lab to set up to have labs drawn.  Set up colonoscopy this year with Dr. Carlean Purl.

## 2016-05-24 NOTE — Assessment & Plan Note (Signed)
Currently in afib.. Rate improving with increase in diltiazem and flecanide. Follows up with cards today.

## 2016-05-24 NOTE — Telephone Encounter (Signed)
[name] notified as instructed by telephone.  She will stop by the office next week and pick up IFOB.  Ferrous Sulfate prescription sent into [facility] [address] in [place].

## 2016-05-24 NOTE — Assessment & Plan Note (Signed)
Perform yearly DVE.

## 2016-05-24 NOTE — Telephone Encounter (Signed)
-----   Message from Emily Sanders, MD sent at 05/24/2016  4:48 PM EDT ----- Notify pt that B12 in nml, anemia stable.. Low iron. Given no clear reason for anemia.. Recommend ruling out GI loss of blood with stool cards.. Have pt complete ifob.  Start ferrous sulfate 325 mg daily unless causes constipation. Return fpore repeat cbc and iron panel in 3-6 months.

## 2016-05-24 NOTE — Progress Notes (Signed)
Pre visit review using our clinic review tool, if applicable. No additional management support is needed unless otherwise documented below in the visit note. 

## 2016-05-24 NOTE — Patient Instructions (Addendum)
Medication Instructions:  Your physician has recommended you make the following change in your medication:  INCREASE flecainide to 100mg  twice daily   Labwork: none  Testing/Procedures: Your physician has recommended that you have a Cardioversion (DCCV). Electrical Cardioversion uses a jolt of electricity to your heart either through paddles or wired patches attached to your chest. This is a controlled, usually prescheduled, procedure. Defibrillation is done under light anesthesia in the hospital, and you usually go home the day of the procedure. This is done to get your heart back into a normal rhythm. You are not awake for the procedure. Please see the instruction sheet given to you today.   Monday, March 26 Arrival time 6:30am [facility] Nothing to eat or drink after midnight.  Hold metformin and insulin the morning of your cardioversion.    Follow-Up: Your physician recommends that you schedule a follow-up appointment in: 2 weeks with Dr. Fletcher Anon.    Any Other Special Instructions Will Be Listed Below (If Applicable).     If you need a refill on your cardiac medications before your next appointment, please call your pharmacy.   Electrical Cardioversion Electrical cardioversion is the delivery of a jolt of electricity to restore a normal rhythm to the heart. A rhythm that is too fast or is not regular keeps the heart from pumping well. In this procedure, sticky patches or metal paddles are placed on the chest to deliver electricity to the heart from a device. This procedure may be done in an emergency if:  There is low or no blood pressure as a result of the heart rhythm.  Normal rhythm must be restored as fast as possible to protect the brain and heart from further damage.  It may save a life. This procedure may also be done for irregular or fast heart rhythms that are not immediately life-threatening. Tell a health care provider about:  Any allergies you  have.  All medicines you are taking, including vitamins, herbs, eye drops, creams, and over-the-counter medicines.  Any problems you or family members have had with anesthetic medicines.  Any blood disorders you have.  Any surgeries you have had.  Any medical conditions you have.  Whether you are pregnant or may be pregnant. What are the risks? Generally, this is a safe procedure. However, problems may occur, including:  Allergic reactions to medicines.  A blood clot that breaks free and travels to other parts of your body.  The possible return of an abnormal heart rhythm within hours or days after the procedure.  Your heart stopping (cardiac arrest). This is rare. What happens before the procedure? Medicines   Your health care provider may have you start taking:  Blood-thinning medicines (anticoagulants) so your blood does not clot as easily.  Medicines may be given to help stabilize your heart rate and rhythm.  Ask your health care provider about changing or stopping your regular medicines. This is especially important if you are taking diabetes medicines or blood thinners. General instructions   Plan to have someone take you home from the hospital or clinic.  If you will be going home right after the procedure, plan to have someone with you for 24 hours.  Follow instructions from your health care provider about eating or drinking restrictions. What happens during the procedure?  To lower your risk of infection:  Your health care team will wash or sanitize their hands.  Your skin will be washed with soap.  An IV tube will be inserted  into one of your veins.  You will be given a medicine to help you relax (sedative).  Sticky patches (electrodes) or metal paddles may be placed on your chest.  An electrical shock will be delivered. The procedure may vary among health care providers and hospitals. What happens after the procedure?  Your blood pressure, heart  rate, breathing rate, and blood oxygen level will be monitored until the medicines you were given have worn off.  Do not drive for 24 hours if you were given a sedative.  Your heart rhythm will be watched to make sure it does not change. This information is not intended to replace advice given to you by your health care provider. Make sure you discuss any questions you have with your health care provider. Document Released: 02/08/2002 Document Revised: 10/18/2015 Document Reviewed: 08/25/2015 Elsevier Interactive Patient Education  2017 [employer].  Electrical Cardioversion, Care After This sheet gives you information about how to care for yourself after your procedure. Your health care provider may also give you more specific instructions. If you have problems or questions, contact your health care provider. What can I expect after the procedure? After the procedure, it is common to have:  Some redness on the skin where the shocks were given. Follow these instructions at home:  Do not drive for 24 hours if you were given a medicine to help you relax (sedative).  Take over-the-counter and prescription medicines only as told by your health care provider.  Ask your health care provider how to check your pulse. Check it often.  Rest for 48 hours after the procedure or as told by your health care provider.  Avoid or limit your caffeine use as told by your health care provider. Contact a health care provider if:  You feel like your heart is beating too quickly or your pulse is not regular.  You have a serious muscle cramp that does not go away. Get help right away if:  You have discomfort in your chest.  You are dizzy or you feel faint.  You have trouble breathing or you are short of breath.  Your speech is slurred.  You have trouble moving an arm or leg on one side of your body.  Your fingers or toes turn cold or blue. This information is not intended to replace advice  given to you by your health care provider. Make sure you discuss any questions you have with your health care provider. Document Released: 12/09/2012 Document Revised: 09/22/2015 Document Reviewed: 08/25/2015 Elsevier Interactive Patient Education  2017 [employer].

## 2016-05-24 NOTE — Progress Notes (Signed)
Cardiology Office Note   Date:  05/24/2016   ID:  [name], DOB January 12, 1946, MRN [id number]  PCP:  Eliezer Lofts, MD  Cardiologist:   Kathlyn Sacramento, MD   Chief Complaint  Patient presents with  . other    Pt. c/o A-Fib with rapid beats at times that started on Sunday. Meds reviewed by the pt. verbally.       History of Present Illness: [name] is a 71 y.o. female who presents for a followup visit regarding persistent atrial fibrillation and mitral valve prolapse.  She had previous cardiac catheterization in October of 2010 for atypical chest pain. It showed no significant coronary artery disease with mildly elevated left ventricular end-diastolic pressure and normal ejection fraction. She has known history of diabetes and hyperlipidemia. Nuclear stress test in July 2015 was normal. She underwent successful cardioversion in November 2017. Echocardiogram at that time showed normal LV systolic function with mild mitral regurgitation, moderately dilated left atrium and mild to moderate pulmonary hypertension.  She has known history of sleep apnea currently on CPAP. She started having palpitations on Sunday. She came to our clinic 2 days ago and was noted to be in atrial fibrillation with rapid ventricular response. The dose of flecainide was increased to 100 mg twice daily. She had some improvement in symptoms but continues to have significant exertional palpitations, fatigue and shortness of breath.  Past Medical History:  Diagnosis Date  . Depressive disorder, not elsewhere classified   . Diabetes mellitus without mention of complication   . Diaphragmatic hernia without mention of obstruction or gangrene   . Disorders of bursae and tendons in shoulder region, unspecified   . Diverticulosis of colon (without mention of hemorrhage)   . Headache(784.0)   . Internal hemorrhoids without mention of complication   . Obesity, unspecified   . Other urinary incontinence   .  Paroxysmal atrial fibrillation (HCC)   . Pure hypercholesterolemia     Past Surgical History:  Procedure Laterality Date  . ABDOMINAL HYSTERECTOMY  1972   Have both ovaries  . BREAST SURGERY     Reduction  . CARDIAC CATHETERIZATION     [place]    . ELECTROPHYSIOLOGIC STUDY N/A 01/24/2016   Procedure: CARDIOVERSION;  Surgeon: [name], MD;  Location: [facility] ORS;  Service: Cardiovascular;  Laterality: N/A;  . TONSILLECTOMY       Current Outpatient Prescriptions  Medication Sig Dispense Refill  . atorvastatin (LIPITOR) 10 MG tablet TAKE ONE TABLET BY MOUTH EVERY NIGHT AT BEDTIME 30 tablet 11  . Calcium Carb-Ergocalciferol 500-200 MG-UNIT TABS Take by mouth.    . diltiazem (CARDIZEM CD) 180 MG 24 hr capsule Take 1 capsule (180 mg total) by mouth daily. 30 capsule 3  . ELIQUIS 5 MG TABS tablet TAKE 1 TABLET BY MOUTH TWICE A DAY 60 tablet 6  . escitalopram (LEXAPRO) 10 MG tablet TAKE ONE TABLET BY MOUTH ONCE DAILY 90 tablet 1  . flecainide (TAMBOCOR) 50 MG tablet Take 1 tablet (50 mg total) by mouth every 12 (twelve) hours. (Patient taking differently: Take 50 mg by mouth every 12 (twelve) hours. ) 180 tablet 3  . glucose blood (ONE TOUCH ULTRA TEST) test strip CHECK BLOOD SUGAR 2 TIMES A DAY AS DIRECTED 200 each 3  . Insulin Glargine (LANTUS SOLOSTAR) 100 UNIT/ML Solostar Pen Inject 12-14 Units into the skin daily at 10 pm. 30 mL 2  . Insulin Pen Needle (B-D ULTRAFINE III SHORT  PEN) 31G X 8 MM MISC Use as directed Dx:250.00 100 each 6  . linagliptin (TRADJENTA) 5 MG TABS tablet Take 1 tablet (5 mg total) by mouth daily. 30 tablet 11  . metFORMIN (GLUCOPHAGE-XR) 500 MG 24 hr tablet Take 4 tablets (2,000 mg total) by mouth daily with supper. 360 tablet 3  . Multiple Vitamin (MULTIVITAMIN) tablet Take 1 tablet by mouth daily.      . ranitidine (ZANTAC) 150 MG tablet Take 150 mg by mouth daily.      . traZODone (DESYREL) 50 MG tablet Take 1-2 tablets (50-100 mg total) by  mouth at bedtime as needed for sleep. 60 tablet 5   No current facility-administered medications for this visit.     Allergies:   Cortisone and Pravastatin sodium    Social History:  The patient  reports that she has never smoked. She has never used smokeless tobacco. She reports that she does not drink alcohol or use drugs.   Family History:  The patient's family history includes Cancer in her father and mother; Dementia in her mother; Diabetes in her brother; Hyperlipidemia in her father and mother; Hypertension in her sister; Pulmonary embolism in her mother; Stroke in her paternal grandfather and paternal grandmother.    ROS:  Please see the history of present illness.   Otherwise, review of systems are positive for none.   All other systems are reviewed and negative.    PHYSICAL EXAM: VS:  BP 120/70 (BP Location: Left Arm, Patient Position: Sitting, Cuff Size: Normal)   Pulse 99   Ht 5' 5.5" (1.664 m)   Wt 200 lb (90.7 kg)   LMP  (LMP Unknown)   BMI 32.78 kg/m  , BMI Body mass index is 32.78 kg/m. GEN: Well nourished, well developed, in no acute distress  HEENT: normal  Neck: no JVD, carotid bruits, or masses Cardiac: Regular rate and rhythm; no  rubs, or gallops,no edema . There is a 2/6 systolic ejection murmur at the base. Respiratory:  clear to auscultation bilaterally, normal work of breathing GI: soft, nontender, nondistended, + BS MS: no deformity or atrophy  Skin: warm and dry, no rash Neuro:  Strength and sensation are intact Psych: euthymic mood, full affect   EKG:  EKG is ordered today. EKG showed atrial fibrillation with ventricular rate of 99 bpm.   Recent Labs: 01/22/2016: TSH 3.402 01/24/2016: B Natriuretic Peptide 181.0 05/10/2016: ALT 14; BUN 19; Creatinine, Ser 1.03; Potassium 4.6; Sodium 138 05/24/2016: Hemoglobin 10.8; Platelets 342.0    Lipid Panel    Component Value Date/Time   CHOL 133 05/10/2016 1313   TRIG 76.0 05/10/2016 1313   HDL  43.20 05/10/2016 1313   CHOLHDL 3 05/10/2016 1313   VLDL 15.2 05/10/2016 1313   LDLCALC 74 05/10/2016 1313   LDLDIRECT 163.1 07/04/2011 0922      Wt Readings from Last 3 Encounters:  05/24/16 200 lb (90.7 kg)  05/24/16 199 lb 4 oz (90.4 kg)  05/23/16 199 lb (90.3 kg)       No flowsheet data found.    ASSESSMENT AND PLAN:  1.   Persistent atrial fibrillation: She continues to be in atrial fibrillation and she is highly symptomatic. I discussed with her different management options including proceeding with repeat cardioversion or evaluation for catheter ablation. She prefers the first option. I recommend continuing the higher dose of flecainide at 100 mg twice daily. She has not missed any doses of Eliquis. We'll schedule cardioversion on Monday. Ablation might  be somewhat difficult due to moderately dilated left atrium with mitral regurgitation.   2. Mitral valve prolapse with moderate regurgitation: She will require a repeat echocardiogram later this year.  3. Hyperlipidemia: Currently on atorvastatin.  4. Sleep apnea: She is more consistent in using her CPAP machine.   Disposition:  Follow up with me in 1 month.   Signed,  Kathlyn Sacramento, MD  05/24/2016 3:40 PM    [facility]

## 2016-05-24 NOTE — Assessment & Plan Note (Signed)
Good control on current regimen. 

## 2016-05-24 NOTE — Progress Notes (Addendum)
Subjective:    Patient ID: [name], female    DOB: Dec 27, 1945, 71 y.o.   MRN: [id number]  HPI The patient is here for  complete physical as well as management of chronic health issues.  The patient saw Candis Musa, LPN for medicare wellness. Note reviewed in detail and important notes copied below. Health maintenance:  Eye exam - per pt, completed in March 2018 Foot exam - per pt, completed in Dec 2017  Abnormal screenings:   Hearing - failed Mini-Cog score: 19/20    TODAY  Diabetes:  Followed  By ENDO.  Excellent control on current regimen. On Tradjenta, metformin, insulin. Lab Results  Component Value Date   HGBA1C 6.9 05/23/2016   Afib followed by Cardiology on eliquis. Last cardioversion 01/2017.Martin Majestic back into afib last week.  Inceased diltiazem and flecanide... Symptoms improved some heart rate now down from 135.  No current chest pain or SOB, mainly fatigue.  Elevated Cholesterol:  LDL at goal on lipitor. Lab Results  Component Value Date   CHOL 133 05/10/2016   HDL 43.20 05/10/2016   LDLCALC 74 05/10/2016   LDLDIRECT 163.1 07/04/2011   TRIG 76.0 05/10/2016   CHOLHDL 3 05/10/2016  Using medications without problems: Muscle aches:  Diet compliance: moderate Exercise: 2-3 times a week Other complaints   HTN, well controlled on cardiazem. 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   Anemia: better since 01/2016 in hospital for cardioversion.. No blood loss seen or known. Hg 01/2016 9.8 now improved at 10.  No clear GI loss.  Will eval with iron labs.  Social History /Family History/Past Medical History reviewed and updated if needed. Blood pressure 114/70, pulse (!) 111, temperature 98.2 F (36.8 C), temperature source Oral, height 5' 5.5" (1.664 m), weight 199 lb 4 oz (90.4 kg).  Review of Systems  Constitutional: Positive for fatigue.    HENT: Negative for ear pain.   Eyes: Negative for pain.  Cardiovascular: Positive for palpitations. Negative for chest pain and leg swelling.  Gastrointestinal: Negative for blood in stool, constipation, diarrhea and nausea.  Genitourinary: Negative for dysuria.  Musculoskeletal: Negative for back pain.  Hematological: Does not bruise/bleed easily.       No gum bleeding.       Objective:   Physical Exam  Constitutional: Vital signs are normal. She appears well-developed and well-nourished. She is cooperative.  Non-toxic appearance. She does not appear ill. No distress.  HENT:  Head: Normocephalic.  Right Ear: Hearing, tympanic membrane, external ear and ear canal normal.  Left Ear: Hearing, tympanic membrane, external ear and ear canal normal.  Nose: Nose normal.  Eyes: Conjunctivae, EOM and lids are normal. Pupils are equal, round, and reactive to light. Lids are everted and swept, no foreign bodies found.  Neck: Trachea normal and normal range of motion. Neck supple. Carotid bruit is not present. No thyroid mass and no thyromegaly present.  Cardiovascular: S1 normal, S2 normal, normal heart sounds and intact distal pulses.  An irregularly irregular rhythm present. Tachycardia present.  Exam reveals no gallop.   No murmur heard. Pulmonary/Chest: Effort normal and breath sounds normal. No respiratory distress. She has no wheezes. She has no rhonchi. She has no rales.  Abdominal: Soft. Normal appearance and bowel sounds are normal. She exhibits no distension, no fluid wave, no abdominal bruit and no mass. There is no hepatosplenomegaly. There is  no tenderness. There is no rebound, no guarding and no CVA tenderness. No hernia.  Genitourinary: Vagina normal and uterus normal. No breast swelling, tenderness, discharge or bleeding. Pelvic exam was performed with patient supine. There is no rash, tenderness or lesion on the right labia. There is no rash, tenderness or lesion on the left labia.  Uterus is not enlarged and not tender. Cervix exhibits no motion tenderness, no discharge and no friability. Right adnexum displays no mass, no tenderness and no fullness. Left adnexum displays no mass, no tenderness and no fullness.  Lymphadenopathy:    She has no cervical adenopathy.    She has no axillary adenopathy.  Neurological: She is alert. She has normal strength. No cranial nerve deficit or sensory deficit.  Skin: Skin is warm, dry and intact. No rash noted.  Psychiatric: Her speech is normal and behavior is normal. Judgment normal. Her mood appears not anxious. Cognition and memory are normal. She does not exhibit a depressed mood.          Assessment & Plan:  The patient's preventative maintenance and recommended screening tests for an annual wellness exam were reviewed in full today. Brought up to date unless services declined.  Counselled on the importance of diet, exercise, and its role in overall health and mortality. The patient's FH and SH was reviewed, including their home life, tobacco status, and drug and alcohol status.   Vaccines: Uptodate with PNA, Tdap, shingles, flu . Mammo: 06/2015, will schedule Colon: 07/2003, Dr. Carlean Purl,  ifob last year neg.. This year.Plans to do colonoscopy  DEXA:06/2014 normal , repeat in 5 years Nonsmoker PAP/DVE: has ovaries, had partial hysterectomy. DVE yearly as mother with cancer in ovary, no pap. Hep C : done

## 2016-05-27 ENCOUNTER — Encounter: Payer: Self-pay | Admitting: *Deleted

## 2016-05-27 ENCOUNTER — Ambulatory Visit: Payer: Commercial Managed Care - HMO | Admitting: Certified Registered Nurse Anesthetist

## 2016-05-27 ENCOUNTER — Ambulatory Visit
Admission: RE | Admit: 2016-05-27 | Discharge: 2016-05-27 | Disposition: A | Discharge status: Home / Self Care | Payer: Commercial Managed Care - HMO | Source: Ambulatory Visit | Attending: Cardiovascular Disease | Admitting: Cardiovascular Disease

## 2016-05-27 ENCOUNTER — Encounter
Admission: RE | Disposition: A | Discharge status: Home / Self Care | Payer: Self-pay | Source: Ambulatory Visit | Attending: Cardiovascular Disease

## 2016-05-27 DIAGNOSIS — G4730 Sleep apnea, unspecified: Secondary | ICD-10-CM | POA: Insufficient documentation

## 2016-05-27 DIAGNOSIS — I340 Nonrheumatic mitral (valve) insufficiency: Secondary | ICD-10-CM | POA: Insufficient documentation

## 2016-05-27 DIAGNOSIS — E7800 Pure hypercholesterolemia, unspecified: Secondary | ICD-10-CM | POA: Diagnosis not present

## 2016-05-27 DIAGNOSIS — R51 Headache: Secondary | ICD-10-CM | POA: Insufficient documentation

## 2016-05-27 DIAGNOSIS — E669 Obesity, unspecified: Secondary | ICD-10-CM | POA: Insufficient documentation

## 2016-05-27 DIAGNOSIS — E119 Type 2 diabetes mellitus without complications: Secondary | ICD-10-CM | POA: Insufficient documentation

## 2016-05-27 DIAGNOSIS — D649 Anemia, unspecified: Secondary | ICD-10-CM | POA: Insufficient documentation

## 2016-05-27 DIAGNOSIS — R69 Illness, unspecified: Secondary | ICD-10-CM | POA: Diagnosis not present

## 2016-05-27 DIAGNOSIS — Z794 Long term (current) use of insulin: Secondary | ICD-10-CM | POA: Insufficient documentation

## 2016-05-27 DIAGNOSIS — I2510 Atherosclerotic heart disease of native coronary artery without angina pectoris: Secondary | ICD-10-CM | POA: Diagnosis not present

## 2016-05-27 DIAGNOSIS — I272 Pulmonary hypertension, unspecified: Secondary | ICD-10-CM | POA: Insufficient documentation

## 2016-05-27 DIAGNOSIS — I481 Persistent atrial fibrillation: Secondary | ICD-10-CM

## 2016-05-27 DIAGNOSIS — K449 Diaphragmatic hernia without obstruction or gangrene: Secondary | ICD-10-CM | POA: Insufficient documentation

## 2016-05-27 DIAGNOSIS — I341 Nonrheumatic mitral (valve) prolapse: Secondary | ICD-10-CM | POA: Diagnosis not present

## 2016-05-27 DIAGNOSIS — F329 Major depressive disorder, single episode, unspecified: Secondary | ICD-10-CM | POA: Insufficient documentation

## 2016-05-27 DIAGNOSIS — Z79899 Other long term (current) drug therapy: Secondary | ICD-10-CM | POA: Diagnosis not present

## 2016-05-27 DIAGNOSIS — I10 Essential (primary) hypertension: Secondary | ICD-10-CM | POA: Diagnosis not present

## 2016-05-27 DIAGNOSIS — K648 Other hemorrhoids: Secondary | ICD-10-CM | POA: Diagnosis not present

## 2016-05-27 HISTORY — PX: CARDIOVERSION: EP1203

## 2016-05-27 SURGERY — CARDIOVERSION (CATH LAB)
Anesthesia: General

## 2016-05-27 MED ORDER — SODIUM CHLORIDE 0.9 % IV SOLN
INTRAVENOUS | Status: DC | PRN
  Administered 2016-05-27: 07:00:00 via INTRAVENOUS

## 2016-05-27 MED ORDER — PROPOFOL 10 MG/ML IV BOLUS
INTRAVENOUS | Status: DC | PRN
  Administered 2016-05-27: 50 mg via INTRAVENOUS

## 2016-05-27 MED ORDER — PHENYLEPHRINE HCL 10 MG/ML IJ SOLN
INTRAMUSCULAR | Status: AC
  Filled 2016-05-27: qty 1

## 2016-05-27 MED ORDER — PROPOFOL 10 MG/ML IV BOLUS
INTRAVENOUS | Status: AC
  Filled 2016-05-27: qty 20

## 2016-05-27 NOTE — Discharge Instructions (Signed)
Electrical Cardioversion, Care After °This sheet gives you information about how to care for yourself after your procedure. Your health care provider may also give you more specific instructions. If you have problems or questions, contact your health care provider. °What can I expect after the procedure? °After the procedure, it is common to have: °· Some redness on the skin where the shocks were given. °Follow these instructions at home: °· Do not drive for 24 hours if you were given a medicine to help you relax (sedative). °· Take over-the-counter and prescription medicines only as told by your health care provider. °· Ask your health care provider how to check your pulse. Check it often. °· Rest for 48 hours after the procedure or as told by your health care provider. °· Avoid or limit your caffeine use as told by your health care provider. °Contact a health care provider if: °· You feel like your heart is beating too quickly or your pulse is not regular. °· You have a serious muscle cramp that does not go away. °Get help right away if: °· You have discomfort in your chest. °· You are dizzy or you feel faint. °· You have trouble breathing or you are short of breath. °· Your speech is slurred. °· You have trouble moving an arm or leg on one side of your body. °· Your fingers or toes turn cold or blue. °This information is not intended to replace advice given to you by your health care provider. Make sure you discuss any questions you have with your health care provider. °Document Released: 12/09/2012 Document Revised: 09/22/2015 Document Reviewed: 08/25/2015 °Elsevier Interactive Patient Education © 2017 [employer] ° °

## 2016-05-27 NOTE — CV Procedure (Signed)
Cardioversion note: A standard informed consent was obtained. Timeout was performed. The pads were placed in the anterior posterior fashion. The patient was given propofol by the anesthesia team.  Successful cardioversion was performed with a 200 J shock. The patient converted to sinus rhythm. Pre-and post EKGs were reviewed. The patient tolerated the procedure with no immediate complications.  Recommendations: Continue same medications and follow-up in 2-3 weeks.

## 2016-05-27 NOTE — Anesthesia Preprocedure Evaluation (Signed)
Anesthesia Evaluation  Patient identified by MRN, date of birth, ID band Patient awake    Reviewed: Allergy & Precautions, NPO status , Patient's Chart, lab work & pertinent test results  History of Anesthesia Complications Negative for: history of anesthetic complications  Airway Mallampati: II  TM Distance: >3 FB Neck ROM: Full    Dental  (+) Poor Dentition   Pulmonary shortness of breath, sleep apnea and Continuous Positive Airway Pressure Ventilation , neg COPD,    breath sounds clear to auscultation- rhonchi (-) wheezing      Cardiovascular hypertension, (-) CAD, (-) Past MI and (-) Cardiac Stents + dysrhythmias (poor rate control) Atrial Fibrillation  Rhythm:Regular Rate:Normal - Systolic murmurs and - Diastolic murmurs Echo 23/55/7322: - Left ventricle: The cavity size was normal. Systolic function was normal. The estimated ejection fraction was in the range of 55% to 60%. Wall motion was normal; there were no regional wall motion abnormalities. The study is not technically sufficient to allow evaluation of LV diastolic function. - Mitral valve: There was mild regurgitation. - Left atrium: The atrium was moderately dilated. - Right ventricle: Systolic function was normal. - Tricuspid valve: There was mild-moderate regurgitation. - Pulmonary arteries: Systolic pressure was mildly elevated. PA peak pressure: 47 mm Hg (S). - Inferior vena cava: The vessel was dilated. The respirophasic diameter changes were blunted (<50%), consistent with elevated central venous pressure.   Neuro/Psych  Headaches,    GI/Hepatic negative GI ROS, Neg liver ROS,   Endo/Other  diabetes, Insulin Dependent, Oral Hypoglycemic Agents  Renal/GU negative Renal ROS     Musculoskeletal   Abdominal (+) + obese,   Peds  Hematology  (+) anemia ,   Anesthesia Other Findings Past Medical History: No date: Depressive disorder,  not elsewhere classified No date: Diabetes mellitus without mention of complicat* No date: Diaphragmatic hernia without mention of obstru* No date: Disorders of bursae and tendons in shoulder re* No date: Diverticulosis of colon (without mention of he* No date: Headache(784.0) No date: Internal hemorrhoids without mention of compli* No date: Obesity, unspecified No date: Other urinary incontinence No date: Paroxysmal atrial fibrillation (HCC) No date: Pure hypercholesterolemia   Reproductive/Obstetrics                             Anesthesia Physical  Anesthesia Plan  ASA: III  Anesthesia Plan: General   Post-op Pain Management:    Induction: Intravenous  Airway Management Planned: Natural Airway  Additional Equipment:   Intra-op Plan:   Post-operative Plan:   Informed Consent: I have reviewed the patients History and Physical, chart, labs and discussed the procedure including the risks, benefits and alternatives for the proposed anesthesia with the patient or authorized representative who has indicated his/her understanding and acceptance.   Dental advisory given  Plan Discussed with: CRNA and Anesthesiologist  Anesthesia Plan Comments:         Anesthesia Quick Evaluation

## 2016-05-27 NOTE — Transfer of Care (Signed)
Immediate Anesthesia Transfer of Care Note  Patient: [name]  Procedure(s) Performed: Procedure(s): CARDIOVERSION (N/A)  Patient Location: PACU  Anesthesia Type:General  Level of Consciousness: awake, alert  and oriented  Airway & Oxygen Therapy: Patient Spontanous Breathing and Patient connected to nasal cannula oxygen  Post-op Assessment: Report given to RN and Post -op Vital signs reviewed and stable  Post vital signs: Reviewed and stable  Last Vitals:  Vitals:   05/27/16 0745 05/27/16 0746  BP:  115/60  Pulse: 65 61  Resp: 18 (!) 22    Last Pain: There were no vitals filed for this visit.       Complications: No apparent anesthesia complications

## 2016-05-27 NOTE — Anesthesia Procedure Notes (Signed)
Date/Time: 05/27/2016 7:30 AM Performed by: Johnna Acosta Pre-anesthesia Checklist: Patient identified, Emergency Drugs available, Suction available, Patient being monitored and Timeout performed Patient Re-evaluated:Patient Re-evaluated prior to inductionOxygen Delivery Method: Nasal cannula

## 2016-05-27 NOTE — Anesthesia Post-op Follow-up Note (Cosign Needed)
Anesthesia QCDR form completed.        

## 2016-05-27 NOTE — Interval H&P Note (Signed)
History and Physical Interval Note:  05/27/2016 7:47 AM  [name]  has presented today for surgery, with the diagnosis of Cardioversion   The various methods of treatment have been discussed with the patient and family. After consideration of risks, benefits and other options for treatment, the patient has consented to  Procedure(s): CARDIOVERSION (N/A) as a surgical intervention .  The patient's history has been reviewed, patient examined, no change in status, stable for surgery.  I have reviewed the patient's chart and labs.  Questions were answered to the patient's satisfaction.     Kathlyn Sacramento

## 2016-05-27 NOTE — H&P (View-Only) (Signed)
Cardiology Office Note   Date:  05/24/2016   ID:  [name], DOB Apr 29, 1945, MRN [id number]  PCP:  Emily Lofts, MD  Cardiologist:   Emily Sacramento, MD   Chief Complaint  Patient presents with  . other    Pt. c/o A-Fib with rapid beats at times that started on Sunday. Meds reviewed by the pt. verbally.       History of Present Illness: [name] is a 71 y.o. female who presents for a followup visit regarding persistent atrial fibrillation and mitral valve prolapse.  She had previous cardiac catheterization in October of 2010 for atypical chest pain. It showed no significant coronary artery disease with mildly elevated left ventricular end-diastolic pressure and normal ejection fraction. She has known history of diabetes and hyperlipidemia. Nuclear stress test in July 2015 was normal. She underwent successful cardioversion in November 2017. Echocardiogram at that time showed normal LV systolic function with mild mitral regurgitation, moderately dilated left atrium and mild to moderate pulmonary hypertension.  She has known history of sleep apnea currently on CPAP. She started having palpitations on Sunday. She came to our clinic 2 days ago and was noted to be in atrial fibrillation with rapid ventricular response. The dose of flecainide was increased to 100 mg twice daily. She had some improvement in symptoms but continues to have significant exertional palpitations, fatigue and shortness of breath.  Past Medical History:  Diagnosis Date  . Depressive disorder, not elsewhere classified   . Diabetes mellitus without mention of complication   . Diaphragmatic hernia without mention of obstruction or gangrene   . Disorders of bursae and tendons in shoulder region, unspecified   . Diverticulosis of colon (without mention of hemorrhage)   . Headache(784.0)   . Internal hemorrhoids without mention of complication   . Obesity, unspecified   . Other urinary incontinence   .  Paroxysmal atrial fibrillation (HCC)   . Pure hypercholesterolemia     Past Surgical History:  Procedure Laterality Date  . ABDOMINAL HYSTERECTOMY  1972   Have both ovaries  . BREAST SURGERY     [place]  . CARDIAC CATHETERIZATION     [place]    . ELECTROPHYSIOLOGIC STUDY N/A 01/24/2016   Procedure: CARDIOVERSION;  Surgeon: Emily Merritts, MD;  Location: [facility] ORS;  Service: Cardiovascular;  Laterality: N/A;  . TONSILLECTOMY       Current Outpatient Prescriptions  Medication Sig Dispense Refill  . atorvastatin (LIPITOR) 10 MG tablet TAKE ONE TABLET BY MOUTH EVERY NIGHT AT BEDTIME 30 tablet 11  . Calcium Carb-Ergocalciferol 500-200 MG-UNIT TABS Take by mouth.    . diltiazem (CARDIZEM CD) 180 MG 24 hr capsule Take 1 capsule (180 mg total) by mouth daily. 30 capsule 3  . ELIQUIS 5 MG TABS tablet TAKE 1 TABLET BY MOUTH TWICE A DAY 60 tablet 6  . escitalopram (LEXAPRO) 10 MG tablet TAKE ONE TABLET BY MOUTH ONCE DAILY 90 tablet 1  . flecainide (TAMBOCOR) 50 MG tablet Take 1 tablet (50 mg total) by mouth every 12 (twelve) hours. (Patient taking differently: Take 50 mg by mouth every 12 (twelve) hours. ) 180 tablet 3  . glucose blood (ONE TOUCH ULTRA TEST) test strip CHECK BLOOD SUGAR 2 TIMES A DAY AS DIRECTED 200 each 3  . Insulin Glargine (LANTUS SOLOSTAR) 100 UNIT/ML Solostar Pen Inject 12-14 Units into the skin daily at 10 pm. 30 mL 2  . Insulin Pen Needle (B-D ULTRAFINE III SHORT  PEN) 31G X 8 MM MISC Use as directed Dx:250.00 100 each 6  . linagliptin (TRADJENTA) 5 MG TABS tablet Take 1 tablet (5 mg total) by mouth daily. 30 tablet 11  . metFORMIN (GLUCOPHAGE-XR) 500 MG 24 hr tablet Take 4 tablets (2,000 mg total) by mouth daily with supper. 360 tablet 3  . Multiple Vitamin (MULTIVITAMIN) tablet Take 1 tablet by mouth daily.      . ranitidine (ZANTAC) 150 MG tablet Take 150 mg by mouth daily.      . traZODone (DESYREL) 50 MG tablet Take 1-2 tablets (50-100 mg total) by  mouth at bedtime as needed for sleep. 60 tablet 5   No current facility-administered medications for this visit.     Allergies:   Cortisone and Pravastatin sodium    Social History:  The patient  reports that she has never smoked. She has never used smokeless tobacco. She reports that she does not drink alcohol or use drugs.   Family History:  The patient's family history includes Cancer in her father and mother; Dementia in her mother; Diabetes in her brother; Hyperlipidemia in her father and mother; Hypertension in her sister; Pulmonary embolism in her mother; Stroke in her paternal grandfather and paternal grandmother.    ROS:  Please see the history of present illness.   Otherwise, review of systems are positive for none.   All other systems are reviewed and negative.    PHYSICAL EXAM: VS:  BP 120/70 (BP Location: Left Arm, Patient Position: Sitting, Cuff Size: Normal)   Pulse 99   Ht 5' 5.5" (1.664 m)   Wt 200 lb (90.7 kg)   LMP  (LMP Unknown)   BMI 32.78 kg/m  , BMI Body mass index is 32.78 kg/m. GEN: Well nourished, well developed, in no acute distress  HEENT: normal  Neck: no JVD, carotid bruits, or masses Cardiac: Regular rate and rhythm; no  rubs, or gallops,no edema . There is a 2/6 systolic ejection murmur at the base. Respiratory:  clear to auscultation bilaterally, normal work of breathing GI: soft, nontender, nondistended, + BS MS: no deformity or atrophy  Skin: warm and dry, no rash Neuro:  Strength and sensation are intact Psych: euthymic mood, full affect   EKG:  EKG is ordered today. EKG showed atrial fibrillation with ventricular rate of 99 bpm.   Recent Labs: 01/22/2016: TSH 3.402 01/24/2016: B Natriuretic Peptide 181.0 05/10/2016: ALT 14; BUN 19; Creatinine, Ser 1.03; Potassium 4.6; Sodium 138 05/24/2016: Hemoglobin 10.8; Platelets 342.0    Lipid Panel    Component Value Date/Time   CHOL 133 05/10/2016 1313   TRIG 76.0 05/10/2016 1313   HDL  43.20 05/10/2016 1313   CHOLHDL 3 05/10/2016 1313   VLDL 15.2 05/10/2016 1313   LDLCALC 74 05/10/2016 1313   LDLDIRECT 163.1 07/04/2011 0922      Wt Readings from Last 3 Encounters:  05/24/16 200 lb (90.7 kg)  05/24/16 199 lb 4 oz (90.4 kg)  05/23/16 199 lb (90.3 kg)       No flowsheet data found.    ASSESSMENT AND PLAN:  1.   Persistent atrial fibrillation: She continues to be in atrial fibrillation and she is highly symptomatic. I discussed with her different management options including proceeding with repeat cardioversion or evaluation for catheter ablation. She prefers the first option. I recommend continuing the higher dose of flecainide at 100 mg twice daily. She has not missed any doses of Eliquis. We'll schedule cardioversion on Monday. Ablation might  be somewhat difficult due to moderately dilated left atrium with mitral regurgitation.   2. Mitral valve prolapse with moderate regurgitation: She will require a repeat echocardiogram later this year.  3. Hyperlipidemia: Currently on atorvastatin.  4. Sleep apnea: She is more consistent in using her CPAP machine.   Disposition:  Follow up with me in 1 month.   Signed,  Emily Sacramento, MD  05/24/2016 3:40 PM    [facility]

## 2016-05-27 NOTE — Anesthesia Postprocedure Evaluation (Signed)
Anesthesia Post Note  Patient: [name]  Procedure(s) Performed: Procedure(s) (LRB): CARDIOVERSION (N/A)  Patient location during evaluation: PACU Anesthesia Type: General Level of consciousness: awake and alert Pain management: pain level controlled Vital Signs Assessment: post-procedure vital signs reviewed and stable Respiratory status: spontaneous breathing, nonlabored ventilation, respiratory function stable and patient connected to nasal cannula oxygen Cardiovascular status: blood pressure returned to baseline and stable Postop Assessment: no signs of nausea or vomiting Anesthetic complications: no     Last Vitals:  Vitals:   05/27/16 0815 05/27/16 0820  BP: 108/60   Pulse: (!) 57 65  Resp: (!) 21 (!) 22    Last Pain: There were no vitals filed for this visit.               Martha Clan

## 2016-05-28 ENCOUNTER — Encounter: Payer: Self-pay | Admitting: Cardiovascular Disease

## 2016-05-29 ENCOUNTER — Telehealth: Payer: Self-pay | Admitting: Cardiovascular Disease

## 2016-05-29 NOTE — Telephone Encounter (Signed)
Pt needs to fly to [place] this evening due to an emergency and asks if this is ok as she had DCCV March 26.   She reports feeling well but spouse insists she get approval from cardiology. Reviewed with Dr. Saunders Revel who has cleared pt to fly to [place]. Informed pt who is appreciative of the call.

## 2016-05-29 NOTE — Telephone Encounter (Signed)
Pt states she had a cardioversion on Monday 3/26, and asks if it is ok if she flies to [place].  Please call and advise.

## 2016-06-05 ENCOUNTER — Telehealth: Payer: Self-pay

## 2016-06-05 NOTE — Telephone Encounter (Signed)
Almyra Free nurse case mgr with [facility] left v/m requesting Dr Diona Browner to encourage pt to utilize case mgt services that are available to member thru [facility]

## 2016-06-07 DIAGNOSIS — G4733 Obstructive sleep apnea (adult) (pediatric): Secondary | ICD-10-CM | POA: Diagnosis not present

## 2016-06-07 DIAGNOSIS — G4730 Sleep apnea, unspecified: Secondary | ICD-10-CM | POA: Diagnosis not present

## 2016-06-10 ENCOUNTER — Ambulatory Visit: Payer: Medicare HMO | Admitting: Cardiovascular Disease

## 2016-06-12 DIAGNOSIS — H2511 Age-related nuclear cataract, right eye: Secondary | ICD-10-CM | POA: Diagnosis not present

## 2016-06-12 DIAGNOSIS — Z79899 Other long term (current) drug therapy: Secondary | ICD-10-CM | POA: Diagnosis not present

## 2016-06-24 ENCOUNTER — Ambulatory Visit
Admission: RE | Admit: 2016-06-24 | Discharge: 2016-06-24 | Disposition: A | Discharge status: Home / Self Care | Payer: Medicare HMO | Source: Ambulatory Visit | Attending: Family Medicine | Admitting: Family Medicine

## 2016-06-24 DIAGNOSIS — Z1231 Encounter for screening mammogram for malignant neoplasm of breast: Secondary | ICD-10-CM

## 2016-06-25 ENCOUNTER — Other Ambulatory Visit: Payer: Self-pay | Admitting: Family Medicine

## 2016-06-27 ENCOUNTER — Ambulatory Visit (INDEPENDENT_AMBULATORY_CARE_PROVIDER_SITE_OTHER): Payer: Medicare HMO

## 2016-06-27 ENCOUNTER — Other Ambulatory Visit: Payer: Self-pay

## 2016-06-27 DIAGNOSIS — I2721 Secondary pulmonary arterial hypertension: Secondary | ICD-10-CM | POA: Diagnosis not present

## 2016-07-07 DIAGNOSIS — G4730 Sleep apnea, unspecified: Secondary | ICD-10-CM | POA: Diagnosis not present

## 2016-07-07 DIAGNOSIS — G4733 Obstructive sleep apnea (adult) (pediatric): Secondary | ICD-10-CM | POA: Diagnosis not present

## 2016-07-08 ENCOUNTER — Encounter: Payer: Self-pay | Admitting: Internal Medicine

## 2016-07-08 ENCOUNTER — Ambulatory Visit (INDEPENDENT_AMBULATORY_CARE_PROVIDER_SITE_OTHER): Payer: 59 | Admitting: Internal Medicine

## 2016-07-08 VITALS — BP 118/60 | HR 72 | Temp 98.1°F | Wt 198.0 lb

## 2016-07-08 DIAGNOSIS — N3941 Urge incontinence: Secondary | ICD-10-CM | POA: Diagnosis not present

## 2016-07-08 DIAGNOSIS — R350 Frequency of micturition: Secondary | ICD-10-CM | POA: Diagnosis not present

## 2016-07-08 LAB — POC URINALSYSI DIPSTICK (AUTOMATED)
BILIRUBIN UA: NEGATIVE
Glucose, UA: NEGATIVE
KETONES UA: NEGATIVE
Nitrite, UA: NEGATIVE
PH UA: 5.5 (ref 5.0–8.0)
PROTEIN UA: NEGATIVE
RBC UA: NEGATIVE
Spec Grav, UA: >=1.03 — AB (ref 1.010–1.025)
Urobilinogen, UA: 0.2 E.U./dL

## 2016-07-08 MED ORDER — OXYBUTYNIN CHLORIDE ER 5 MG PO TB24: 5.0000 mg | ORAL_TABLET | Freq: Every day | ORAL | 2 refills | Status: DC

## 2016-07-08 NOTE — Patient Instructions (Addendum)
X                                Overactive Bladder, Adult Overactive bladder is a group of urinary symptoms. With overactive bladder, you may suddenly feel the need to pass urine (urinate) right away. After feeling this sudden urge, you might also leak urine if you cannot get to the bathroom fast enough (urinary incontinence). These symptoms might interfere with your daily work or social activities. Overactive bladder symptoms may also wake you up at night. Overactive bladder affects the nerve signals between your bladder and your brain. Your bladder may get the signal to empty before it is full. Very sensitive muscles can also make your bladder squeeze too soon. What are the causes? Many things can cause an overactive bladder. Possible causes include:  Urinary tract infection.  Infection of nearby tissues, such as the prostate.  Prostate enlargement.  Being pregnant with twins or more (multiples).  Surgery on the uterus or urethra.  Bladder stones, inflammation, or tumors.  Drinking too much caffeine or alcohol.  Certain medicines, especially those that you take to help your body get rid of extra fluid (diuretics) by increasing urine production.  Muscle or nerve weakness, especially from:  A spinal cord injury.  Stroke.  Multiple sclerosis.  Parkinson disease.  Diabetes. This can cause a high urine volume that fills the bladder so quickly that the normal urge to urinate is triggered very strongly.  Constipation. A buildup of too much stool can put pressure on your bladder. What increases the risk? You may be at greater risk for overactive bladder if you:  Are an older adult.  Smoke.  Are going through menopause.  Have prostate problems.  Have a neurological disease, such as stroke, dementia, Parkinson disease, or multiple sclerosis (MS).  Eat or drink things that irritate the bladder. These include alcohol, spicy food, and caffeine.  Are overweight or obese. What  are the signs or symptoms? The signs and symptoms of an overactive bladder include:  Sudden, strong urges to urinate.  Leaking urine.  Urinating eight or more times per day.  Waking up to urinate two or more times per night. How is this diagnosed? Your health care provider may suspect overactive bladder based on your symptoms. The health care provider will do a physical exam and take your medical history. Blood or urine tests may also be done. For example, you might need to have a bladder function test to check how well you can hold your urine. You might also need to see a health care provider who specializes in the urinary tract (urologist). How is this treated? Treatment for overactive bladder depends on the cause of your condition and whether it is mild or severe. Certain treatments can be done in your health care provider's office or clinic. You can also make lifestyle changes at home. Options include: Behavioral Treatments   Biofeedback. A specialist uses sensors to help you become aware of your body's signals.  Keeping a daily log of when you need to urinate and what happens after the urge. This may help you manage your condition.  Bladder training. This helps you learn to control the urge to urinate by following a schedule that directs you to urinate at regular intervals (timed voiding). At first, you might have to wait a few minutes after feeling the urge. In time, you should be able to schedule bathroom visits an hour  or more apart.  Kegel exercises. These are exercises to strengthen the pelvic floor muscles, which support the bladder. Toning these muscles can help you control urination, even if your bladder muscles are overactive. A specialist will teach you how to do these exercises correctly. They require daily practice.  Weight loss. If you are obese or overweight, losing weight might relieve your symptoms of overactive bladder. Talk to your health care provider about losing  weight and whether there is a specific program or method that would work best for you.  Diet change. This might help if constipation is making your overactive bladder worse. Your health care provider or a dietitian can explain ways to change what you eat to ease constipation. You might also need to consume less alcohol and caffeine or drink other fluids at different times of the day.  Stopping smoking.  Wearing pads to absorb leakage while you wait for other treatments to take effect. Physical Treatments   Electrical stimulation. Electrodes send gentle pulses of electricity to strengthen the nerves or muscles that help to control the bladder. Sometimes, the electrodes are placed outside of the body. In other cases, they might be placed inside the body (implanted). This treatment can take several months to have an effect.  Supportive devices. Women may need a plastic device that fits into the vagina and supports the bladder (pessary). Medicines  Several medicines can help treat overactive bladder and are usually used along with other treatments. Some are injected into the muscles involved in urination. Others come in pill form. Your health care provider may prescribe:  Antispasmodics. These medicines block the signals that the nerves send to the bladder. This keeps the bladder from releasing urine at the wrong time.  Tricyclic antidepressants. These types of antidepressants also relax bladder muscles. Surgery   You may have a device implanted to help manage the nerve signals that indicate when you need to urinate.  You may have surgery to implant electrodes for electrical stimulation.  Sometimes, very severe cases of overactive bladder require surgery to change the shape of the bladder. Follow these instructions at home:  Take medicines only as directed by your health care provider.  Use any implants or a pessary as directed by your health care provider.  Make any diet or lifestyle  changes that are recommended by your health care provider. These might include:  Drinking less fluid or drinking at different times of the day. If you need to urinate often during the night, you may need to stop drinking fluids early in the evening.  Cutting down on caffeine or alcohol. Both can make an overactive bladder worse. Caffeine is found in coffee, tea, and sodas.  Doing Kegel exercises to strengthen muscles.  Losing weight if you need to.  Eating a healthy and balanced diet to prevent constipation.  Keep a journal or log to track how much and when you drink and also when you feel the need to urinate. This will help your health care provider to monitor your condition. Contact a health care provider if:  Your symptoms do not get better after treatment.  Your pain and discomfort are getting worse.  You have more frequent urges to urinate.  You have a fever. Get help right away if: You are not able to control your bladder at all. This information is not intended to replace advice given to you by your health care provider. Make sure you discuss any questions you have with your health care provider. Document  Released: 12/15/2008 Document Revised: 07/27/2015 Document Reviewed: 07/14/2013 Elsevier Interactive Patient Education  2017 [employer].

## 2016-07-08 NOTE — Progress Notes (Signed)
HPI  Pt presents to the clinic today with c/o urinary frequency. She reports this started 1 week ago. She denies urgency, dysuria or blood in her urine. She does have some low back pain, but is not sure if this is related. She denies fever, chills, or nausea. She does reports some ongoing urinary urge incontinence which is concerning for her. She has to now wear disposable underwear. She has taken AZO but reports it has only provided minimal relief.   Review of Systems  Past Medical History:  Diagnosis Date  . Depressive disorder, not elsewhere classified   . Diabetes mellitus without mention of complication   . Diaphragmatic hernia without mention of obstruction or gangrene   . Disorders of bursae and tendons in shoulder region, unspecified   . Diverticulosis of colon (without mention of hemorrhage)   . Headache(784.0)   . Internal hemorrhoids without mention of complication   . Obesity, unspecified   . Other urinary incontinence   . Paroxysmal atrial fibrillation (HCC)   . Pure hypercholesterolemia     Family History  Problem Relation Age of Onset  . Cancer Mother     fullopian tube  . Dementia Mother   . Pulmonary embolism Mother   . Hyperlipidemia Mother   . Cancer Father     lung  . Hyperlipidemia Father   . Hypertension Sister   . Diabetes Brother   . Stroke Paternal Grandmother   . Stroke Paternal Grandfather     Social History   Social History  . Marital status: Married    Spouse name: N/A  . Number of children: 2  . Years of education: N/A   Occupational History  . 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 Smoking status: Never Smoker  . Smokeless tobacco: Never Used  . Alcohol use No  . Drug use: No  . Sexual activity: Yes   Other Topics Concern  . Not on file   Social History Narrative   Exercise walks 3 times daily         Diet fruit, occ fast food, salads and lean meat    Allergies  Allergen Reactions  . Cortisone    . Pravastatin Sodium     REACTION: leg pain     Constitutional: Denies fever, malaise, fatigue, headache or abrupt weight changes.   GU: Pt reports urgency, frequency. Denies dysuria, burning sensation, blood in urine, odor or discharge.   No other specific complaints in a complete review of systems (except as listed in HPI above).    Objective:   Physical Exam  BP 118/60   Pulse 72   Temp 98.1 F (36.7 C) (Oral)   Wt 198 lb (89.8 kg)   LMP  (LMP Unknown)   SpO2 98%   BMI 32.45 kg/m  Wt Readings from Last 3 Encounters:  07/08/16 198 lb (89.8 kg)  05/24/16 200 lb (90.7 kg)  05/24/16 199 lb 4 oz (90.4 kg)    General: Appears her stated age, well developed, well nourished in NAD. Abdomen: Soft and nontender. Normal bowel sounds. No distention or masses noted. No CVA tenderness.       Assessment & Plan:   Urinary Frequency:  Urinalysis: trace leuks Will send urine culture OK to take AZO OTC Drink plenty of fluids  Urge Incontinence:  Will start Ditropan 5 mg ER daily Discussed side effects  RTC as needed or if symptoms persist. Webb Silversmith, NP

## 2016-07-08 NOTE — Addendum Note (Signed)
Addended by: Lurlean Nanny on: 07/08/2016 11:57 AM   Modules accepted: Orders

## 2016-07-10 DIAGNOSIS — H2512 Age-related nuclear cataract, left eye: Secondary | ICD-10-CM | POA: Diagnosis not present

## 2016-07-10 LAB — URINE CULTURE

## 2016-07-11 ENCOUNTER — Ambulatory Visit: Payer: Medicare HMO | Admitting: Pulmonary Disease

## 2016-07-15 ENCOUNTER — Encounter: Payer: Self-pay | Admitting: Pulmonary Disease

## 2016-07-15 ENCOUNTER — Ambulatory Visit (INDEPENDENT_AMBULATORY_CARE_PROVIDER_SITE_OTHER): Payer: 59 | Admitting: Pulmonary Disease

## 2016-07-15 ENCOUNTER — Other Ambulatory Visit: Payer: Self-pay | Admitting: Pulmonary Disease

## 2016-07-15 VITALS — BP 152/80 | HR 71 | Resp 16 | Ht 65.5 in | Wt 198.0 lb

## 2016-07-15 DIAGNOSIS — G4733 Obstructive sleep apnea (adult) (pediatric): Secondary | ICD-10-CM

## 2016-07-15 DIAGNOSIS — E669 Obesity, unspecified: Secondary | ICD-10-CM

## 2016-07-15 DIAGNOSIS — I2729 Other secondary pulmonary hypertension: Secondary | ICD-10-CM | POA: Diagnosis not present

## 2016-07-15 MED ORDER — TRAZODONE HCL 50 MG PO TABS: 50.0000 mg | ORAL_TABLET | Freq: Every evening | ORAL | 5 refills | Status: DC | PRN

## 2016-07-15 NOTE — Patient Instructions (Signed)
Congratulations on your excellent efforts at getting and keeping herself healthy Continue CPAP Continue efforts at weight loss Follow-up in 6 months or sooner as needed

## 2016-07-15 NOTE — Progress Notes (Signed)
PULMONARY OFFICE FOLLOW UP NOTE  Requesting MD/Service: Fletcher Anon Date of initial consultation: 02/09/16 Reason for consultation: OSA  PT PROFILE: 71 y.o. F never smoker with history of OSA diagnosed 09/2008 referred by Dr Fletcher Anon for evaluation of persistent daytime hypersomnolence  DATA: Sleep study 09/14/08 [facility]): AHI 32/hr with predominance of hypopneas. SpO2 nadir 86%. Echocardiogram 10/08/13: Normal LVEF and diastolic function, Mild MVP, moderate MR, trivial TR,  RVSP not estimated  Echocardiogram 01/22/16: Atrial fibirllation, LVEF 55-60%, mild MR, LA moderately dilated, mild to moderate TR, RVSP estimate 47 mmHg Echocardiogram 06/27/16: LVEF 55-60%. LA moderately dilated, RA mildly dilated. RVSP est. normal  SUBJ:  This is a routine reevaluation. She is using the CPAP each night and tolerating it well. He feels better rested when she awakens and has improved daytime hypersomnolence. She has no new complaints.   Vitals:   07/15/16 1032 07/15/16 1043  BP:  (!) 152/80  Pulse:  71  Resp:  16  SpO2:  95%  Weight:  198 lb (89.8 kg)  Height: 5' 5.5" (1.664 m) 5' 5.5" (1.664 m)     EXAM:   Gen: WDWN in NAD HEENT: NCAT, sclerae white, oropharynx normal Neck: NO LAN, no JVD noted Lungs: full BS, no wheezes Cardiovascular: Reg rate, normal rhythm, no M noted Abdomen: Soft, NT, +BS Ext: no C/C/E Neuro: PERRL, EOMI, motor/sensory grossly intact Skin: No lesions noted   DATA:   BMP Latest Ref Rng & Units 05/10/2016 01/24/2016 01/22/2016  Glucose 70 - 99 mg/dL 82 165(H) 131(H)  BUN 6 - 23 mg/dL 19 15 19   Creatinine 0.40 - 1.20 mg/dL 1.03 0.92 1.12(H)  Sodium 135 - 145 mEq/L 138 138 136  Potassium 3.5 - 5.1 mEq/L 4.6 3.9 4.3  Chloride 96 - 112 mEq/L 103 102 104  CO2 19 - 32 mEq/L 29 28 26   Calcium 8.4 - 10.5 mg/dL 9.6 8.8(L) 8.7(L)    CBC Latest Ref Rng & Units 05/24/2016 05/10/2016 01/24/2016  WBC 4.0 - 10.5 K/uL 7.2 7.3 7.9  Hemoglobin 12.0 - 15.0 g/dL 10.8(L) 10.3(L) 9.8(L)   Hematocrit 36.0 - 46.0 % 33.3(L) 31.7(L) 29.5(L)  Platelets 150.0 - 400.0 K/uL 342.0 319.0 349    CXR: no new film  IMPRESSION:     ICD-9-CM ICD-10-CM   1. OSA (obstructive sleep apnea) 327.23 G47.33   2. Pulmonary hypertension, improved with treatment of sleep apnea 416.8 I27.29   3. Obesity, mild 278.00 E66.9    Sleep apnea as well controlled with CPAP. Pulmonary hypertension appears to be resolved with treatment of the sleep apnea. She continues to use trazodone as needed for insomnia.  PLAN:  Continue CPAP Continue efforts at weight loss Follow-up in 6 months or sooner as needed   Merton Border, MD PCCM service Mobile [phone] Pager [phone] 07/15/2016

## 2016-08-01 ENCOUNTER — Encounter: Payer: Self-pay | Admitting: Cardiovascular Disease

## 2016-08-01 ENCOUNTER — Ambulatory Visit (INDEPENDENT_AMBULATORY_CARE_PROVIDER_SITE_OTHER): Payer: 59 | Admitting: Cardiovascular Disease

## 2016-08-01 VITALS — BP 140/60 | HR 58 | Ht 65.0 in | Wt 194.8 lb

## 2016-08-01 DIAGNOSIS — I481 Persistent atrial fibrillation: Secondary | ICD-10-CM | POA: Diagnosis not present

## 2016-08-01 DIAGNOSIS — I4819 Other persistent atrial fibrillation: Secondary | ICD-10-CM

## 2016-08-01 DIAGNOSIS — E785 Hyperlipidemia, unspecified: Secondary | ICD-10-CM | POA: Diagnosis not present

## 2016-08-01 DIAGNOSIS — I340 Nonrheumatic mitral (valve) insufficiency: Secondary | ICD-10-CM

## 2016-08-01 NOTE — Patient Instructions (Signed)
Medication Instructions: Continue same medications.   Labwork: None.   Procedures/Testing: None.   Follow-Up: 6 months with Dr. Arida.   Any Additional Special Instructions Will Be Listed Below (If Applicable).     If you need a refill on your cardiac medications before your next appointment, please call your pharmacy.   

## 2016-08-01 NOTE — Progress Notes (Signed)
Cardiology Office Note   Date:  08/01/2016   ID:  [name], DOB 1945-08-01, MRN [id number]  PCP:  Jinny Sanders, MD  Cardiologist:   Kathlyn Sacramento, MD   Chief Complaint  Patient presents with  . other    Follow up from cardioversion. Patient denies chest pain and SOB.  Meds reviewed verbally with patient.       History of Present Illness: [name] is a 71 y.o. female who presents for a followup visit regarding persistent atrial fibrillation and mitral valve prolapse.  She had previous cardiac catheterization in October of 2010 for atypical chest pain. It showed no significant coronary artery disease with mildly elevated left ventricular end-diastolic pressure and normal ejection fraction. She has known history of diabetes and hyperlipidemia. Nuclear stress test in July 2015 was normal. Echocardiogram at that time showed normal LV systolic function with mild mitral regurgitation, moderately dilated left atrium and mild to moderate pulmonary hypertension.  She has known history of sleep apnea currently on CPAP. She had recurrent atrial fibrillation in March. The dose of flecainide was increased to 100 mg twice daily and she underwent successful cardioversion. No recurrent arrhythmia since then. She had an echocardiogram ordered by Dr. Alva Garnet which was done in April and showed normal LV systolic function with mild to moderate mitral regurgitation and moderately dilated left atrium. She is doing well overall with no chest pain, shortness of breath or palpitations.   Past Medical History:  Diagnosis Date  . Depressive disorder, not elsewhere classified   . Diabetes mellitus without mention of complication   . Diaphragmatic hernia without mention of obstruction or gangrene   . Disorders of bursae and tendons in shoulder region, unspecified   . Diverticulosis of colon (without mention of hemorrhage)   . Headache(784.0)   . Internal hemorrhoids without mention of  complication   . Obesity, unspecified   . Other urinary incontinence   . Paroxysmal atrial fibrillation (HCC)   . Pure hypercholesterolemia     Past Surgical History:  Procedure Laterality Date  . ABDOMINAL HYSTERECTOMY  1972   Have both ovaries  . BREAST SURGERY     Reduction  . [place] N/A 05/27/2016   Procedure: CARDIOVERSION;  Surgeon: [name], MD;  Location: [facility] ORS;  Service: Cardiovascular;  Laterality: N/A;  . CATARACT EXTRACTION Right    left scheduled for 07/2016  . CHOLECYSTECTOMY    . ELECTROPHYSIOLOGIC STUDY N/A 01/24/2016   Procedure: CARDIOVERSION;  Surgeon: [name], MD;  Location: [facility] ORS;  Service: Cardiovascular;  Laterality: N/A;  . REDUCTION MAMMAPLASTY Bilateral   . TONSILLECTOMY       Current Outpatient Prescriptions  Medication Sig Dispense Refill  . atorvastatin (LIPITOR) 10 MG tablet TAKE ONE TABLET BY MOUTH EVERY NIGHT AT BEDTIME 90 tablet 3  . Calcium Carb-Ergocalciferol 500-200 MG-UNIT TABS Take by mouth.    . diltiazem (CARDIZEM CD) 180 MG 24 hr capsule Take 1 capsule (180 mg total) by mouth daily. 30 capsule 3  . ELIQUIS 5 MG TABS tablet TAKE 1 TABLET BY MOUTH TWICE A DAY 60 tablet 6  . escitalopram (LEXAPRO) 10 MG tablet TAKE ONE TABLET BY MOUTH ONCE DAILY 90 tablet 1  . ferrous sulfate 325 (65 FE) MG tablet Take 1 tablet (325 mg total) by mouth daily with breakfast. 90 tablet 3  . flecainide (TAMBOCOR) 100 MG tablet Take 1 tablet (100 mg total)  by mouth 2 (two) times daily. 60 tablet 5  . glucose blood (ONE TOUCH ULTRA TEST) test strip CHECK BLOOD SUGAR 2 TIMES A DAY AS DIRECTED 200 each 3  . Insulin Glargine (LANTUS SOLOSTAR) 100 UNIT/ML Solostar Pen Inject 12-14 Units into the skin daily at 10 pm. 30 mL 2  . Insulin Pen Needle (B-D ULTRAFINE III SHORT PEN) 31G X 8 MM MISC Use as directed Dx:250.00 100 each 6  . linagliptin (TRADJENTA) 5 MG TABS tablet Take 1 tablet (5 mg total) by mouth  daily. 30 tablet 11  . metFORMIN (GLUCOPHAGE-XR) 500 MG 24 hr tablet Take 4 tablets (2,000 mg total) by mouth daily with supper. 360 tablet 3  . Multiple Vitamin (MULTIVITAMIN) tablet Take 1 tablet by mouth daily.      [name] oxybutynin (DITROPAN-XL) 5 MG 24 hr tablet Take 1 tablet (5 mg total) by mouth at bedtime. 30 tablet 2  . ranitidine (ZANTAC) 150 MG tablet Take 150 mg by mouth daily.      . traZODone (DESYREL) 50 MG tablet Take 1-2 tablets (50-100 mg total) by mouth at bedtime as needed for sleep. 60 tablet 5   No current facility-administered medications for this visit.     Allergies:   Cortisone and Pravastatin sodium    Social History:  The patient  reports that she has never smoked. She has never used smokeless tobacco. She reports that she does not drink alcohol or use drugs.   Family History:  The patient's family history includes Cancer in her father and mother; Dementia in her mother; Diabetes in her brother; Hyperlipidemia in her father and mother; Hypertension in her sister; Pulmonary embolism in her mother; Stroke in her paternal grandfather and paternal grandmother.    ROS:  Please see the history of present illness.   Otherwise, review of systems are positive for none.   All other systems are reviewed and negative.    PHYSICAL EXAM: VS:  BP 140/60 (BP Location: Left Arm, Patient Position: Sitting, Cuff Size: Normal)   Pulse (!) 58   Ht 5\' 5"  (1.651 m)   Wt 194 lb 12 oz (88.3 kg)   LMP  (LMP Unknown)   BMI 32.41 kg/m  , BMI Body mass index is 32.41 kg/m. GEN: Well nourished, well developed, in no acute distress  HEENT: normal  Neck: no JVD, carotid bruits, or masses Cardiac: Regular rate and rhythm; no  rubs, or gallops,no edema . There is a 2/6 systolic ejection murmur at the base. Respiratory:  clear to auscultation bilaterally, normal work of breathing GI: soft, nontender, nondistended, + BS MS: no deformity or atrophy  Skin: warm and dry, no rash Neuro:   Strength and sensation are intact Psych: euthymic mood, full affect   EKG:  EKG is ordered today. EKG showed sinus bradycardia with a heart rate of 58 bpm. No significant ST or T wave changes.   Recent Labs: 01/22/2016: TSH 3.402 01/24/2016: B Natriuretic Peptide 181.0 05/10/2016: ALT 14; BUN 19; Creatinine, Ser 1.03; Potassium 4.6; Sodium 138 05/24/2016: Hemoglobin 10.8; Platelets 342.0    Lipid Panel    Component Value Date/Time   CHOL 133 05/10/2016 1313   TRIG 76.0 05/10/2016 1313   HDL 43.20 05/10/2016 1313   CHOLHDL 3 05/10/2016 1313   VLDL 15.2 05/10/2016 1313   LDLCALC 74 05/10/2016 1313   LDLDIRECT 163.1 07/04/2011 0922      Wt Readings from Last 3 Encounters:  08/01/16 194 lb 12 oz (88.3 kg)  07/15/16 198 lb (89.8 kg)  07/08/16 198 lb (89.8 kg)       No flowsheet data found.    ASSESSMENT AND PLAN:  1.   Persistent atrial fibrillation:  She is maintaining in sinus rhythm on flecainide. Continue same dose. Continue long-term anticoagulation with Eliquis.  2. Mitral valve prolapse with moderate regurgitation: I reviewed most recent echocardiogram in April which showed moderate eccentric mitral regurgitation, grade 2 diastolic dysfunction, moderately dilated left atrium and no evidence of pulmonary hypertension. Repeat echocardiogram in  one year.   3. Hyperlipidemia: Currently on atorvastatin.  4. Sleep apnea: She is more consistent in using her CPAP machine.   Disposition:  Follow up with me in 1 month.   Signed,  Kathlyn Sacramento, MD  08/01/2016 3:15 PM    [place]

## 2016-08-11 ENCOUNTER — Other Ambulatory Visit: Payer: Self-pay | Admitting: Cardiovascular Disease

## 2016-08-12 ENCOUNTER — Ambulatory Visit: Payer: Medicare HMO | Admitting: Cardiovascular Disease

## 2016-08-23 DIAGNOSIS — G4730 Sleep apnea, unspecified: Secondary | ICD-10-CM | POA: Diagnosis not present

## 2016-08-23 DIAGNOSIS — G4733 Obstructive sleep apnea (adult) (pediatric): Secondary | ICD-10-CM | POA: Diagnosis not present

## 2016-08-28 ENCOUNTER — Other Ambulatory Visit: Payer: Self-pay | Admitting: Internal Medicine

## 2016-08-29 NOTE — Telephone Encounter (Signed)
Last filled 07/08/16 with 2 refills from [facility]... Please advise if okay to refill-- pt had annual exam 05/2016

## 2016-09-07 ENCOUNTER — Other Ambulatory Visit: Payer: Self-pay | Admitting: Pulmonary Disease

## 2016-09-16 ENCOUNTER — Ambulatory Visit (INDEPENDENT_AMBULATORY_CARE_PROVIDER_SITE_OTHER): Payer: 59 | Admitting: Internal Medicine

## 2016-09-16 ENCOUNTER — Encounter: Payer: Self-pay | Admitting: Internal Medicine

## 2016-09-16 VITALS — BP 128/74 | HR 60 | Ht 65.0 in | Wt 196.0 lb

## 2016-09-16 DIAGNOSIS — Z794 Long term (current) use of insulin: Secondary | ICD-10-CM

## 2016-09-16 DIAGNOSIS — E119 Type 2 diabetes mellitus without complications: Secondary | ICD-10-CM | POA: Diagnosis not present

## 2016-09-16 LAB — POCT GLYCOSYLATED HEMOGLOBIN (HGB A1C): HEMOGLOBIN A1C: 6.9

## 2016-09-16 MED ORDER — INSULIN GLARGINE 100 UNIT/ML SOLOSTAR PEN: 12.0000 [IU] | PEN_INJECTOR | Freq: Every day | SUBCUTANEOUS | 5 refills | Status: DC

## 2016-09-16 NOTE — Patient Instructions (Addendum)
Please continue; - Basaglar 14 units at bedtime.  - Metformin ER 1000 mg 2x a day - Tradjenta 5 mg before lunch  Read the following book: Dr. Alyssa Grove - Program for Reversing Diabetes  Please come back for a follow-up appointment in 3 months.

## 2016-09-16 NOTE — Addendum Note (Signed)
Addended by: Caprice Beaver T on: 09/16/2016 04:03 PM   Modules accepted: Orders

## 2016-09-16 NOTE — Progress Notes (Signed)
Patient ID: [name], female   DOB: 06/10/1945, 71 y.o.   MRN: [id number]  HPI: [name] is a 71 y.o.-year-old female, returning for f/u for DM2, dx 1995, insulin-dependent since 2014, uncontrolled, without complications. Last visit 4 months ago.  Patient has Afib >> admitted with A fib with RVR in 01/2016 >> had repeated cardioversion episodes. On Flecainide, Diltiazem.  She restarted exercise.  Last hemoglobin A1c was: Lab Results  Component Value Date   HGBA1C 6.9 05/23/2016   HGBA1C 6.8 02/23/2016   HGBA1C 6.8 11/24/2015   Pt is on a regimen of: - Metformin XR 1000 mg 2x a day - Januvia 100 mg daily in am >> Tradjenta 5 mg daily before lunch - Lantus 22 >> 18 units in am >> Basaglar 12 >> 14 units in hs  Pt checks her sugars 2x a day - forgot log:  - am: 104-147, 155, 162 >> 90-133 >> 96-157, 189 >> 90-120, 150 >> 90-120s >> 90-120 - 2h after b'fast: 110-131, 308 x1 >> 117 >> 90-103 >> 113-141 >> n/c - before lunch:n/c >> 120, 147 >> n/c >> 106 >> 123-134 >> 80-125 >> 90-130 >> 100-125, 199 (forgot meds) - 2h after lunch: 1110 >> 96, 124, 198 >> 97, 103, 203 >> 160-180 >> 140-160s >> n/c - before dinner: 82-91 >> 211, 209 >> n/c >> 102-144 >> n/c - 2h after dinner:  139 >> 180s, 289 - stress, around her A fib episode >> n/c - bedtime: n/c >> 115-154 >> 120-144 >> 114, 228 >> n/c >> 120-160 >> 140-160 - nighttime: n/c No lows. Lowest sugar was 96 >> 90 >> 90s >> 90; she has hypoglycemia awareness at 87.  Highest sugar was 203 >> 300x1 >> 180 >> 199 (forgot med).  Pt's meals are: - Breakfast: cereals + 2% milk; bread + cheese; bread + PB - Lunch: sandwich; soup - Dinner: chicken; steak; seafood - Snacks: 2: PB crackers  - No history of CKD, last BUN/creatinine:  Lab Results  Component Value Date   BUN 19 05/10/2016   CREATININE 1.03 05/10/2016   - last set of lipids: Lab Results  Component Value Date   CHOL 133 05/10/2016   HDL 43.20 05/10/2016   LDLCALC  74 05/10/2016   LDLDIRECT 163.1 07/04/2011   TRIG 76.0 05/10/2016   CHOLHDL 3 05/10/2016  On Lipitor. - last eye exam was in 05/2016 - [name]. No DR. + cataracts. She had surgery in 04 and 07/2016 - She denies numbness and tingling in her feet.  ROS: Constitutional: no weight gain/no weight loss, no fatigue, no subjective hyperthermia, no subjective hypothermia, + nocturia Eyes: no blurry vision, no xerophthalmia ENT: no sore throat, no nodules palpated in throat, no dysphagia, no odynophagia, no hoarseness Cardiovascular: no CP/no SOB/no palpitations/no leg swelling Respiratory: no cough/no SOB/no wheezing Gastrointestinal: no N/no V/no D/no C/no acid reflux Musculoskeletal: no muscle aches/no joint aches Skin: no rashes, no hair loss Neurological: no tremors/no numbness/no tingling/no dizziness  I reviewed pt's medications, allergies, PMH, social hx, family hx, and changes were documented in the history of present illness. Otherwise, unchanged from my initial visit note.  PE: BP 128/74 (BP Location: Left Arm, Patient Position: Sitting)   Pulse 60   Ht 5\' 5"  (1.651 m)   Wt 196 lb (88.9 kg)   LMP  (LMP Unknown)   SpO2 97%   BMI 32.62 kg/m  Body mass index is 32.62 kg/m. Wt Readings from Last 3 Encounters:  09/16/16 196 lb (88.9 kg)  08/01/16 194 lb 12 oz (88.3 kg)  07/15/16 198 lb (89.8 kg)   Constitutional: overweight, in NAD Eyes: PERRLA, EOMI, no exophthalmos ENT: moist mucous membranes, no thyromegaly, no cervical lymphadenopathy Cardiovascular: RRR, No MRG Respiratory: CTA B Gastrointestinal: abdomen soft, NT, ND, BS+ Musculoskeletal: no deformities, strength intact in all 4 Skin: moist, warm, no rashes Neurological: no tremor with outstretched hands, DTR normal in all 4  ASSESSMENT: 1. DM2, insulin-dependent, controlled, without long term complications, but with hyperglycemia - she was contemplating gastric sleeve sx >> on hold now  PLAN:  1. Patient  with long-standing, Now more controlled diabetes, on basal insulin and oral antidiabetic regimen. Her sugars are close to goal, but we discussed about the need to improve er diet so we can reduce insulin further. I suggested to change to a plant-based diet. - I suggested to:  Patient Instructions  Please continue; - Basaglar 14 units at bedtime.  - Metformin ER 1000 mg 2x a day - Tradjenta 5 mg before lunch  Read the following book: Dr. Alyssa Grove - Program for Reversing Diabetes  Please come back for a follow-up appointment in 3 months.  - today, HbA1c is 6.9% (stable) - continue checking sugars at different times of the day - check 1-2x a day, rotating checks - advised for yearly eye exams >> she is UTD - Return to clinic in 3 mo with sugar log    [name], MD PhD [facility] Endocrinology

## 2016-09-17 ENCOUNTER — Other Ambulatory Visit: Payer: Self-pay | Admitting: Internal Medicine

## 2016-09-22 DIAGNOSIS — G4730 Sleep apnea, unspecified: Secondary | ICD-10-CM | POA: Diagnosis not present

## 2016-09-22 DIAGNOSIS — G4733 Obstructive sleep apnea (adult) (pediatric): Secondary | ICD-10-CM | POA: Diagnosis not present

## 2016-10-16 ENCOUNTER — Other Ambulatory Visit: Payer: Self-pay | Admitting: Internal Medicine

## 2016-10-31 ENCOUNTER — Encounter (INDEPENDENT_AMBULATORY_CARE_PROVIDER_SITE_OTHER): Payer: Self-pay

## 2016-11-13 ENCOUNTER — Ambulatory Visit (INDEPENDENT_AMBULATORY_CARE_PROVIDER_SITE_OTHER): Payer: Self-pay | Admitting: Family Medicine

## 2016-11-14 ENCOUNTER — Ambulatory Visit (INDEPENDENT_AMBULATORY_CARE_PROVIDER_SITE_OTHER): Payer: 59 | Admitting: *Deleted

## 2016-11-14 ENCOUNTER — Telehealth: Payer: Self-pay | Admitting: Cardiovascular Disease

## 2016-11-14 ENCOUNTER — Other Ambulatory Visit: Payer: Self-pay | Admitting: Cardiovascular Disease

## 2016-11-14 VITALS — BP 125/70 | HR 116 | Wt 191.0 lb

## 2016-11-14 DIAGNOSIS — I481 Persistent atrial fibrillation: Secondary | ICD-10-CM | POA: Diagnosis not present

## 2016-11-14 DIAGNOSIS — I4819 Other persistent atrial fibrillation: Secondary | ICD-10-CM

## 2016-11-14 MED ORDER — DILTIAZEM HCL ER COATED BEADS 240 MG PO CP24: 240.0000 mg | ORAL_CAPSULE | Freq: Every day | ORAL | 3 refills | Status: DC

## 2016-11-14 NOTE — Telephone Encounter (Signed)
Patient was seen as nurse visit. See nurse visit.

## 2016-11-14 NOTE — Telephone Encounter (Signed)
S/w patient. She noticed her heart rate has been mildly elevated for 2 days.  This morning it was 110/82, HR 115 before taking medications. She feels a slight "tightness" in her chest. Felt some palpitations last night 2-3 times for a second. Occasional dizziness upon changing position from sitting to standing. Says she "doesn't feel right." Similar to the last time she was in a. Fib. Denies SOB.  While on phone, BP 121/66, HR 98. Patient agreed to come in this morning for EKG check. Will make Dr. Fletcher Anon aware at that time.

## 2016-11-14 NOTE — Progress Notes (Signed)
1.) Reason for visit: elevated heart rate  2.) Name of MD requesting visit: Dr. Fletcher Anon  3.) H&P: atrial fib  4.) ROS related to problem: Triage call from patient with c/o increased heart rate, occasional dizziness upon changing from sitting to standing, "tight" feeling in chest at times. Denies shortness of breath. Feels like in a. Fib.  Patient now here in office, EKG performed. BP 125/70, HR 116.  5.) Assessment and plan per MD: Dr Fletcher Anon reviewed EKG and chart. Patient in A. Fib today. Advised to increase Diltiazem to 240 mg by mouth once a day and referral to A. Fib clinic.

## 2016-11-14 NOTE — Telephone Encounter (Signed)
Pt is calling stating she has had a high HR yesterday and this morning  States it was high yesterday but does not remember the reading This morning it was 110/82 HR 115  She states she would like to make sure and or also would like to know what to do when it is this high  Usually she is about 80's in the morning.   Please advise.

## 2016-11-14 NOTE — Patient Instructions (Signed)
Medication Instructions:  Your physician has recommended you make the following change in your medication:  1- INCREASE Diltiazem to 240 mg by  Mouth once a day.   Labwork: none  Testing/Procedures: none  Follow-Up: You have been referred to THE A. FIB CLINIC IN [place] Parking is beneath the building: USE CODE [id number]. Call [phone] for full directions on parking and access.    If you need a refill on your cardiac medications before your next appointment, please call your pharmacy.

## 2016-11-15 ENCOUNTER — Other Ambulatory Visit: Payer: Self-pay | Admitting: Cardiovascular Disease

## 2016-11-18 ENCOUNTER — Encounter (HOSPITAL_COMMUNITY): Payer: Self-pay | Admitting: Nurse Practitioner

## 2016-11-18 ENCOUNTER — Ambulatory Visit (HOSPITAL_COMMUNITY)
Admission: RE | Admit: 2016-11-18 | Discharge: 2016-11-18 | Disposition: A | Discharge status: Home / Self Care | Payer: 59 | Source: Ambulatory Visit | Attending: Nurse Practitioner | Admitting: Nurse Practitioner

## 2016-11-18 ENCOUNTER — Telehealth: Payer: Self-pay | Admitting: *Deleted

## 2016-11-18 VITALS — BP 122/64 | HR 130 | Ht 65.0 in | Wt 191.8 lb

## 2016-11-18 DIAGNOSIS — Z9049 Acquired absence of other specified parts of digestive tract: Secondary | ICD-10-CM | POA: Diagnosis not present

## 2016-11-18 DIAGNOSIS — Z833 Family history of diabetes mellitus: Secondary | ICD-10-CM | POA: Insufficient documentation

## 2016-11-18 DIAGNOSIS — Z794 Long term (current) use of insulin: Secondary | ICD-10-CM | POA: Diagnosis not present

## 2016-11-18 DIAGNOSIS — Z8249 Family history of ischemic heart disease and other diseases of the circulatory system: Secondary | ICD-10-CM | POA: Insufficient documentation

## 2016-11-18 DIAGNOSIS — I4510 Unspecified right bundle-branch block: Secondary | ICD-10-CM | POA: Diagnosis not present

## 2016-11-18 DIAGNOSIS — E669 Obesity, unspecified: Secondary | ICD-10-CM | POA: Diagnosis not present

## 2016-11-18 DIAGNOSIS — F329 Major depressive disorder, single episode, unspecified: Secondary | ICD-10-CM | POA: Insufficient documentation

## 2016-11-18 DIAGNOSIS — Z79899 Other long term (current) drug therapy: Secondary | ICD-10-CM | POA: Insufficient documentation

## 2016-11-18 DIAGNOSIS — K648 Other hemorrhoids: Secondary | ICD-10-CM | POA: Diagnosis not present

## 2016-11-18 DIAGNOSIS — I480 Paroxysmal atrial fibrillation: Secondary | ICD-10-CM

## 2016-11-18 DIAGNOSIS — Z801 Family history of malignant neoplasm of trachea, bronchus and lung: Secondary | ICD-10-CM | POA: Insufficient documentation

## 2016-11-18 DIAGNOSIS — Z90710 Acquired absence of both cervix and uterus: Secondary | ICD-10-CM | POA: Diagnosis not present

## 2016-11-18 DIAGNOSIS — Z823 Family history of stroke: Secondary | ICD-10-CM | POA: Diagnosis not present

## 2016-11-18 DIAGNOSIS — E7800 Pure hypercholesterolemia, unspecified: Secondary | ICD-10-CM | POA: Insufficient documentation

## 2016-11-18 DIAGNOSIS — Z808 Family history of malignant neoplasm of other organs or systems: Secondary | ICD-10-CM | POA: Insufficient documentation

## 2016-11-18 DIAGNOSIS — Z888 Allergy status to other drugs, medicaments and biological substances status: Secondary | ICD-10-CM | POA: Insufficient documentation

## 2016-11-18 DIAGNOSIS — E119 Type 2 diabetes mellitus without complications: Secondary | ICD-10-CM | POA: Diagnosis not present

## 2016-11-18 DIAGNOSIS — K449 Diaphragmatic hernia without obstruction or gangrene: Secondary | ICD-10-CM | POA: Diagnosis not present

## 2016-11-18 LAB — BASIC METABOLIC PANEL
ANION GAP: 7 (ref 5–15)
BUN: 12 mg/dL (ref 6–20)
CALCIUM: 9.1 mg/dL (ref 8.9–10.3)
CO2: 26 mmol/L (ref 22–32)
CREATININE: 0.96 mg/dL (ref 0.44–1.00)
Chloride: 104 mmol/L (ref 101–111)
GFR, EST NON AFRICAN AMERICAN: 59 mL/min — AB (ref >60)
Glucose, Bld: 141 mg/dL — ABNORMAL HIGH (ref 65–99)
Potassium: 4.3 mmol/L (ref 3.5–5.1)
Sodium: 137 mmol/L (ref 135–145)

## 2016-11-18 LAB — CBC
HCT: 35.4 % — ABNORMAL LOW (ref 36.0–46.0)
Hemoglobin: 11.3 g/dL — ABNORMAL LOW (ref 12.0–15.0)
MCH: 28.5 pg (ref 26.0–34.0)
MCHC: 31.9 g/dL (ref 30.0–36.0)
MCV: 89.2 fL (ref 78.0–100.0)
PLATELETS: 291 10*3/uL (ref 150–400)
RBC: 3.97 MIL/uL (ref 3.87–5.11)
RDW: 14.1 % (ref 11.5–15.5)
WBC: 5.6 10*3/uL (ref 4.0–10.5)

## 2016-11-18 LAB — TSH: TSH: 2.719 u[IU]/mL (ref 0.350–4.500)

## 2016-11-18 NOTE — Telephone Encounter (Signed)
-----   Message from Juluis Mire, RN sent at 11/18/2016 10:29 AM EDT ----- Regarding: help with cardioversion Pt needs cardioversion set up-- pt would like this to performed in [place]. Can someone help with this scheduling? No missed doses of her Eliquis so cardioversion only will be needed. Pt will be expecting call. We will go ahead and draw her labs needed. Thank you!! [facility]

## 2016-11-18 NOTE — Telephone Encounter (Signed)
Patient returning call to schedule cardioversion  °

## 2016-11-18 NOTE — Telephone Encounter (Signed)
No answer. Left message to call back to schedule cardioversion.

## 2016-11-18 NOTE — Patient Instructions (Signed)
[place] office will in touch with you to schedule cardioversion.  Scheduler will be in touch with you to schedule appointment with Dr. Rayann Heman to discuss ablation.

## 2016-11-18 NOTE — Telephone Encounter (Signed)
Returned call to patient. She is agreeable to cardioversion on 11/25/16 at 0730. She verbalized understanding of the following instructions:   You are scheduled for a Cardioversion on __09/24/18_____ with Dr._Arida___ Please arrive at the [facility] at __06:30_ a.m. on the day of your procedure.  DIET INSTRUCTIONS:  Nothing to eat or drink after midnight except your medications with a              sip of water.         1) Labs: ____Were done today___  2) Medications:  YOU MAY TAKE ALL of your remaining medications with a small amount of water.  3) Must have a responsible person to drive you home.  4) Bring a current list of your medications and current insurance cards.    If you have any questions after you get home, please call the office at [phone]

## 2016-11-18 NOTE — Progress Notes (Signed)
Primary Care Physician: Jinny Sanders, MD Referring Physician: Dr. Onnie Graham is a 71 y.o. female with a h/o Depressive disorder, DM, HTN, paroxysmal afib, on flecainide, since fall of 2017. She went to afib several days ago, no know trigger. She is on xarelto with a chadsvasc score of at least at least 4. No missed doses. She runs fast in afib , despite increase of cardizem to 240 mg daily as well as atenolol, v rate remains at 130 bpm. She feels very fatigued in atrial fib. She has required cardioversion last Nov and again in April. She does have OSA and does wear CPAP. No excessive caffeine, no alcohol. LA size 44 mm.  Today, she denies symptoms of chest pain,  orthopnea, PND, lower extremity edema, dizziness, presyncope, syncope, or neurologic sequela. Positive for palpitations, shortness of breath with exertion, fatigue. The patient is tolerating medications without difficulties and is otherwise without complaint today.   Past Medical History:  Diagnosis Date  . Depressive disorder, not elsewhere classified   . Diabetes mellitus without mention of complication   . Diaphragmatic hernia without mention of obstruction or gangrene   . Disorders of bursae and tendons in shoulder region, unspecified   . Diverticulosis of colon (without mention of hemorrhage)   . Headache(784.0)   . Internal hemorrhoids without mention of complication   . Obesity, unspecified   . Other urinary incontinence   . Paroxysmal atrial fibrillation (HCC)   . Pure hypercholesterolemia    Past Surgical History:  Procedure Laterality Date  . ABDOMINAL HYSTERECTOMY  1972   Have both ovaries  . BREAST SURGERY     Reduction  . [place] N/A 05/27/2016   Procedure: CARDIOVERSION;  Surgeon: [name], MD;  Location: [facility] ORS;  Service: Cardiovascular;  Laterality: N/A;  . CATARACT EXTRACTION Right    left scheduled for 07/2016  . CHOLECYSTECTOMY    .  ELECTROPHYSIOLOGIC STUDY N/A 01/24/2016   Procedure: CARDIOVERSION;  Surgeon: [name], MD;  Location: [facility] ORS;  Service: Cardiovascular;  Laterality: N/A;  . REDUCTION MAMMAPLASTY Bilateral   . TONSILLECTOMY      Current Outpatient Prescriptions  Medication Sig Dispense Refill  . atorvastatin (LIPITOR) 10 MG tablet TAKE ONE TABLET BY MOUTH EVERY NIGHT AT BEDTIME 90 tablet 3  . Calcium Carb-Ergocalciferol 500-200 MG-UNIT TABS Take by mouth.    . diltiazem (CARDIZEM CD) 240 MG 24 hr capsule Take 1 capsule (240 mg total) by mouth daily. 90 capsule 3  . ELIQUIS 5 MG TABS tablet TAKE 1 TABLET BY MOUTH TWICE A DAY 60 tablet 3  . escitalopram (LEXAPRO) 10 MG tablet TAKE ONE TABLET BY MOUTH ONCE DAILY 90 tablet 1  . ferrous sulfate 325 (65 FE) MG tablet Take 1 tablet (325 mg total) by mouth daily with breakfast. 90 tablet 3  . flecainide (TAMBOCOR) 100 MG tablet TAKE 1 TABLET (100 MG TOTAL) BY MOUTH 2 (TWO) TIMES DAILY. 180 tablet 0  . glucose blood (ONE TOUCH ULTRA TEST) test strip CHECK BLOOD SUGAR 2 TIMES A DAY AS DIRECTED 200 each 3  . Insulin Glargine (LANTUS SOLOSTAR) 100 UNIT/ML Solostar Pen Inject 12-14 Units into the skin daily at 10 pm. 30 mL 5  . Insulin Pen Needle (B-D ULTRAFINE III SHORT PEN) 31G X 8 MM MISC Use as directed Dx:250.00 100 each 6  . linagliptin (TRADJENTA) 5 MG TABS tablet Take 1 tablet (5 mg  total) by mouth daily. 30 tablet 11  . metFORMIN (GLUCOPHAGE-XR) 500 MG 24 hr tablet Take 4 tablets (2,000 mg total) by mouth daily with supper. (Patient taking differently: Take 1,000 mg by mouth 2 (two) times daily. ) 360 tablet 3  . Multiple Vitamin (MULTIVITAMIN) tablet Take 1 tablet by mouth daily.      [name] oxybutynin (DITROPAN-XL) 5 MG 24 hr tablet TAKE ONE TABLET BY MOUTH EVERY NIGHT AT BEDTIME 30 tablet 11  . ranitidine (ZANTAC) 150 MG tablet Take 150 mg by mouth daily.      . traZODone (DESYREL) 50 MG tablet Take 1-2 tablets (50-100 mg total) by mouth at bedtime as  needed for sleep. 60 tablet 5   No current facility-administered medications for this encounter.     Allergies  Allergen Reactions  . Cortisone   . Pravastatin Sodium     REACTION: leg pain    Social History   Social History  . Marital status: Married    Spouse name: N/A  . Number of children: 2  . Years of education: N/A   Occupational History  . 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 Smoking status: Never Smoker  . Smokeless tobacco: Never Used  . Alcohol use No  . Drug use: No  . Sexual activity: Yes   Other Topics Concern  . Not on file   Social History Narrative   Exercise walks 3 times daily         Diet fruit, occ fast food, salads and lean meat    Family History  Problem Relation Age of Onset  . Cancer Mother        fullopian tube  . Dementia Mother   . Pulmonary embolism Mother   . Hyperlipidemia Mother   . Cancer Father        lung  . Hyperlipidemia Father   . Hypertension Sister   . Diabetes Brother   . Stroke Paternal Grandmother   . Stroke Paternal Grandfather     ROS- All systems are reviewed and negative except as per the HPI above  Physical Exam: Vitals:   11/18/16 1003  BP: 122/64  Pulse: (!) 130  Weight: 191 lb 12.8 oz (87 kg)  Height: 5\' 5"  (1.651 m)   Wt Readings from Last 3 Encounters:  11/18/16 191 lb 12.8 oz (87 kg)  11/14/16 191 lb (86.6 kg)  09/16/16 196 lb (88.9 kg)    Labs: Lab Results  Component Value Date   NA 138 05/10/2016   K 4.6 05/10/2016   CL 103 05/10/2016   CO2 29 05/10/2016   GLUCOSE 82 05/10/2016   BUN 19 05/10/2016   CREATININE 1.03 05/10/2016   CALCIUM 9.6 05/10/2016   No results found for: INR Lab Results  Component Value Date   CHOL 133 05/10/2016   HDL 43.20 05/10/2016   LDLCALC 74 05/10/2016   TRIG 76.0 05/10/2016     GEN- The patient is well appearing, alert and oriented x 3 today.   Head- normocephalic, atraumatic Eyes-  Sclera clear, conjunctiva  pink Ears- hearing intact Oropharynx- clear Neck- supple, no JVP Lymph- no cervical lymphadenopathy Lungs- Clear to ausculation bilaterally, normal work of breathing Heart- irregular rate and rhythm, no murmurs, rubs or gallops, PMI not laterally displaced GI- soft, NT, ND, + BS Extremities- no clubbing, cyanosis, or edema MS- no significant deformity or atrophy Skin- no rash or lesion Psych- euthymic mood, full affect Neuro- strength and sensation  are intact  EKG- Afib with rvr at 130 bpm, qrs int 96 ms, qtc 497 ms. Epic records reviewed    Assessment and Plan: 1. Paroxysmal afib Required cardioversion in Nov 2017,this past April as well as in afib now I feel that flecainide is being ineffective to keep pt in rhythm Discussed options to restore SR, antiarrythmic's, Tikosyn or sotalol, feel she is on the young side for  Amiodarone I also discussed ablation as I feel she would be an acceptable candidate The pt would like to pursue cardioversion so she can feel better sooner than later, then see Dr. Lamount Cohen for an ablation,as the ablation may take weeks to put in place. No missed doses of eliquis. Bmet,cbc,tsh today Continue  flecainide at 100 mg bid as well as Cardizem and atenolol at current doses  [place] office contacted to help set up at [facility], as this would be more convenient  for her I will request an appointment with Dr. Rayann Heman in the next 2-3 weeks to discuss ablation  Butch Penny C. Lexus Shampine, [facility] [address] [place], [place] [address] [phone]

## 2016-11-20 ENCOUNTER — Encounter (INDEPENDENT_AMBULATORY_CARE_PROVIDER_SITE_OTHER): Payer: Self-pay | Admitting: Family Medicine

## 2016-11-20 ENCOUNTER — Ambulatory Visit (INDEPENDENT_AMBULATORY_CARE_PROVIDER_SITE_OTHER): Payer: 59 | Admitting: Family Medicine

## 2016-11-20 VITALS — BP 116/76 | HR 109 | Temp 97.5°F | Ht 65.0 in | Wt 185.0 lb

## 2016-11-20 DIAGNOSIS — E559 Vitamin D deficiency, unspecified: Secondary | ICD-10-CM | POA: Diagnosis not present

## 2016-11-20 DIAGNOSIS — Z0289 Encounter for other administrative examinations: Secondary | ICD-10-CM

## 2016-11-20 DIAGNOSIS — E669 Obesity, unspecified: Secondary | ICD-10-CM

## 2016-11-20 DIAGNOSIS — I482 Chronic atrial fibrillation, unspecified: Secondary | ICD-10-CM

## 2016-11-20 DIAGNOSIS — Z1389 Encounter for screening for other disorder: Secondary | ICD-10-CM

## 2016-11-20 DIAGNOSIS — Z6830 Body mass index (BMI) 30.0-30.9, adult: Secondary | ICD-10-CM

## 2016-11-20 DIAGNOSIS — R0602 Shortness of breath: Secondary | ICD-10-CM | POA: Diagnosis not present

## 2016-11-20 DIAGNOSIS — E119 Type 2 diabetes mellitus without complications: Secondary | ICD-10-CM

## 2016-11-20 DIAGNOSIS — Z1331 Encounter for screening for depression: Secondary | ICD-10-CM

## 2016-11-20 DIAGNOSIS — R5383 Other fatigue: Secondary | ICD-10-CM | POA: Diagnosis not present

## 2016-11-20 NOTE — Progress Notes (Signed)
.  Office: [phone]  /  Fax: [phone]   HPI:   Chief Complaint: OBESITY  [name] (MR# [id number]) is a 71 y.o. female who presents on 11/20/2016 for obesity evaluation and treatment. Current BMI is Body mass index is 30.79 kg/m.[name] has struggled with obesity for years and has been unsuccessful in either losing weight or maintaining long term weight loss. [name] attended our information session and states she is currently in the action stage of change and ready to dedicate time achieving and maintaining a healthier weight.  [name] states her family eats meals together she thinks her family will eat healthier with  her her desired weight loss is 42 lbs she has been heavy most of  her life she started gaining weight in 1989 her heaviest weight ever was 237 lbs. she has significant food cravings issues  she snacks frequently in the evenings she frequently makes poor food choices she has problems with excessive hunger  she frequently eats larger portions than normal  she has binge eating behaviors she struggles with emotional eating    Fatigue [name] feels her energy is lower than it should be. This has worsened with weight gain and has not worsened recently. [name] admits to daytime somnolence and admits to waking up still tired. Patient is at risk for obstructive sleep apnea. Patent has a history of symptoms of daytime fatigue and morning fatigue. Patient generally gets 8 hours of sleep per night, and states they generally have restful sleep. Snoring is present. Apneic episodes are present. Epworth Sleepiness Score is 6  Dyspnea on exertion [name] notes increasing shortness of breath with exercising and seems to be worsening over time with weight gain. She notes getting out of breath sooner with activity than she used to. This has not gotten worse recently. [name] denies orthopnea.  Vitamin D deficiency [name] has a diagnosis of vitamin D deficiency. She is not currently  taking vit D and there are no recent labs. [name] admits fatigue but denies nausea, vomiting or muscle weakness.  Atrial Fibrillation [name] has a diagnosis of Afib, rate is mostly controlled. She had Diltiazem increased recently and is scheduled for cardioversion next week with possible ablation next month. [name] is on Eliquis faithfully. She admits fatigue.  Diabetes II [name] has a diagnosis of diabetes type II. [name] didn't bring in her blood sugar log but states fasting BGs range in the 120 to 180's and denies any hypoglycemic episodes. [name] self adjusts her Glargine and she is also on Metformin. She has been working on intensive lifestyle modifications including diet, exercise, and weight loss to help control her blood glucose levels.   Depression Screen Morning's Food and Mood (modified PHQ-9) score was  Depression screen PHQ 2/9 11/20/2016  Decreased Interest 2  Down, Depressed, Hopeless 3  PHQ - 2 Score 5  Altered sleeping 2  Tired, decreased energy 2  Change in appetite 3  Feeling bad or failure about yourself  3  Trouble concentrating 3  Moving slowly or fidgety/restless 0  Suicidal thoughts 1  PHQ-9 Score 19  Difficult doing work/chores Somewhat difficult    ALLERGIES: Allergies  Allergen Reactions  . Cortisone   . Pravastatin Sodium     REACTION: leg pain    MEDICATIONS: Current Outpatient Prescriptions on File Prior to Visit  Medication Sig Dispense Refill  . atorvastatin (LIPITOR) 10 MG tablet TAKE ONE TABLET BY MOUTH EVERY NIGHT AT BEDTIME 90 tablet 3  . Calcium Carb-Ergocalciferol 500-200 MG-UNIT TABS  Take by mouth.    . diltiazem (CARDIZEM CD) 240 MG 24 hr capsule Take 1 capsule (240 mg total) by mouth daily. 90 capsule 3  . ELIQUIS 5 MG TABS tablet TAKE 1 TABLET BY MOUTH TWICE A DAY 60 tablet 3  . escitalopram (LEXAPRO) 10 MG tablet TAKE ONE TABLET BY MOUTH ONCE DAILY 90 tablet 1  . flecainide (TAMBOCOR) 100 MG tablet TAKE 1 TABLET (100 MG TOTAL) BY MOUTH 2  (TWO) TIMES DAILY. 180 tablet 0  . glucose blood (ONE TOUCH ULTRA TEST) test strip CHECK BLOOD SUGAR 2 TIMES A DAY AS DIRECTED 200 each 3  . Insulin Glargine (LANTUS SOLOSTAR) 100 UNIT/ML Solostar Pen Inject 12-14 Units into the skin daily at 10 pm. 30 mL 5  . Insulin Pen Needle (B-D ULTRAFINE III SHORT PEN) 31G X 8 MM MISC Use as directed Dx:250.00 100 each 6  . linagliptin (TRADJENTA) 5 MG TABS tablet Take 1 tablet (5 mg total) by mouth daily. 30 tablet 11  . metFORMIN (GLUCOPHAGE-XR) 500 MG 24 hr tablet Take 4 tablets (2,000 mg total) by mouth daily with supper. (Patient taking differently: Take 1,000 mg by mouth 2 (two) times daily. ) 360 tablet 3  . Multiple Vitamin (MULTIVITAMIN) tablet Take 1 tablet by mouth daily.      . ranitidine (ZANTAC) 150 MG tablet Take 150 mg by mouth daily.      . traZODone (DESYREL) 50 MG tablet Take 1-2 tablets (50-100 mg total) by mouth at bedtime as needed for sleep. 60 tablet 5  . ferrous sulfate 325 (65 FE) MG tablet Take 1 tablet (325 mg total) by mouth daily with breakfast. 90 tablet 3  . oxybutynin (DITROPAN-XL) 5 MG 24 hr tablet TAKE ONE TABLET BY MOUTH EVERY NIGHT AT BEDTIME (Patient not taking: Reported on 11/20/2016) 30 tablet 11   No current facility-administered medications on file prior to visit.     PAST MEDICAL HISTORY: Past Medical History:  Diagnosis Date  . Anemia   . Constipation   . Depressive disorder, not elsewhere classified   . Diabetes mellitus without mention of complication   . Diaphragmatic hernia without mention of obstruction or gangrene   . Disorders of bursae and tendons in shoulder region, unspecified   . Diverticulosis of colon (without mention of hemorrhage)   . Gallbladder problem   . Headache(784.0)   . HTN (hypertension)   . Hypothyroidism   . Internal hemorrhoids without mention of complication   . Obesity, unspecified   . Other urinary incontinence   . Palpitations   . Paroxysmal atrial fibrillation (HCC)   .  Pure hypercholesterolemia   . Sleep apnea   . Vitamin B 12 deficiency     PAST SURGICAL HISTORY: Past Surgical History:  Procedure Laterality Date  . ABDOMINAL HYSTERECTOMY  1972   Have both ovaries  . BREAST SURGERY     Reduction  . [place] N/A 05/27/2016   Procedure: CARDIOVERSION;  Surgeon: [name], MD;  Location: [facility] ORS;  Service: Cardiovascular;  Laterality: N/A;  . CATARACT EXTRACTION Right    left scheduled for 07/2016  . CHOLECYSTECTOMY    . ELECTROPHYSIOLOGIC STUDY N/A 01/24/2016   Procedure: CARDIOVERSION;  Surgeon: [name], MD;  Location: [facility] ORS;  Service: Cardiovascular;  Laterality: N/A;  . REDUCTION MAMMAPLASTY Bilateral   . TONSILLECTOMY      SOCIAL HISTORY: Social History  Substance Use Topics  . Smoking status:  Never Smoker  . Smokeless tobacco: Never Used  . Alcohol use No    FAMILY HISTORY: Family History  Problem Relation Age of Onset  . Cancer Mother        fullopian tube  . Dementia Mother   . Pulmonary embolism Mother   . Hyperlipidemia Mother   . Diabetes Mother   . Cancer Father        lung  . Hyperlipidemia Father   . Hypertension Sister   . Diabetes Brother   . Stroke Paternal Grandmother   . Stroke Paternal Grandfather     ROS: Review of Systems  Constitutional: Positive for malaise/fatigue.  HENT: Positive for hearing loss.        Dry Mouth  Respiratory: Positive for shortness of breath (with activity).   Cardiovascular: Negative for orthopnea.  Gastrointestinal: Negative for nausea and vomiting.  Genitourinary: Positive for frequency.  Musculoskeletal:       Negative muscle weakness  Skin:       Hair or Nail changes  Neurological: Positive for weakness.  Endo/Heme/Allergies: Positive for polydipsia.       Positive polyphagia  Psychiatric/Behavioral: Positive for depression. The patient is nervous/anxious (nervousness) and has insomnia.        Stress      PHYSICAL EXAM: Blood pressure 116/76, pulse (!) 109, temperature (!) 97.5 F (36.4 C), temperature source Oral, height 5\' 5"  (1.651 m), weight 185 lb (83.9 kg), SpO2 100 %. Body mass index is 30.79 kg/m. Physical Exam  Constitutional: She is oriented to person, place, and time. She appears well-developed and well-nourished.  Cardiovascular:  Heart Rate is irregularly irregular with Heart Rate approximately 100 on auscultation  Pulmonary/Chest: Effort normal.  Musculoskeletal: Normal range of motion.  Neurological: She is oriented to person, place, and time.  Skin: Skin is warm and dry.  Psychiatric: She has a normal mood and affect. Her behavior is normal.  Vitals reviewed.   RECENT LABS AND TESTS: BMET    Component Value Date/Time   NA 137 11/18/2016 1030   K 4.3 11/18/2016 1030   CL 104 11/18/2016 1030   CO2 26 11/18/2016 1030   GLUCOSE 141 (H) 11/18/2016 1030   BUN 12 11/18/2016 1030   CREATININE 0.96 11/18/2016 1030   CREATININE 0.94 09/24/2013 1610   CALCIUM 9.1 11/18/2016 1030   GFRNONAA 59 (L) 11/18/2016 1030   GFRAA >60 11/18/2016 1030   Lab Results  Component Value Date   HGBA1C 6.9 09/16/2016   No results found for: INSULIN CBC    Component Value Date/Time   WBC 5.6 11/18/2016 1030   RBC 3.97 11/18/2016 1030   HGB 11.3 (L) 11/18/2016 1030   HCT 35.4 (L) 11/18/2016 1030   PLT 291 11/18/2016 1030   MCV 89.2 11/18/2016 1030   MCH 28.5 11/18/2016 1030   MCHC 31.9 11/18/2016 1030   RDW 14.1 11/18/2016 1030   LYMPHSABS 1.8 05/24/2016 1024   MONOABS 0.5 05/24/2016 1024   EOSABS 0.1 05/24/2016 1024   BASOSABS 0.0 05/24/2016 1024   Iron/TIBC/Ferritin/ %Sat    Component Value Date/Time   IRON 57 05/24/2016 1024   FERRITIN 5.3 (L) 05/24/2016 1024   IRONPCTSAT 12.1 (L) 05/24/2016 1024   Lipid Panel     Component Value Date/Time   CHOL 133 05/10/2016 1313   TRIG 76.0 05/10/2016 1313   HDL 43.20 05/10/2016 1313   CHOLHDL 3 05/10/2016 1313   VLDL  15.2 05/10/2016 1313   LDLCALC 74 05/10/2016 1313   LDLDIRECT  163.1 07/04/2011 0922   Hepatic Function Panel     Component Value Date/Time   PROT 6.9 05/10/2016 1313   ALBUMIN 4.1 05/10/2016 1313   AST 13 05/10/2016 1313   ALT 14 05/10/2016 1313   ALKPHOS 57 05/10/2016 1313   BILITOT 0.3 05/10/2016 1313   BILIDIR 0.0 07/10/2010 0822   IBILI 0.3 04/02/2010 2210      Component Value Date/Time   TSH 2.719 11/18/2016 1030   TSH 3.402 01/22/2016 1450   TSH 2.885 09/24/2013 1610    ECG  shows NSR with a rate of 130 BPM INDIRECT CALORIMETER done today shows a VO2 of 161 and a REE of 1123. Her calculated basal metabolic rate is 2542 thus her basal metabolic rate is worse than expected.    ASSESSMENT AND PLAN: Other fatigue  Shortness of breath on exertion - Plan: Lipid Panel With LDL/HDL Ratio  Type 2 diabetes mellitus without complication, without long-term current use of insulin (HCC) - Plan: Comprehensive metabolic panel, Hemoglobin A1c, Insulin, random, Microalbumin / creatinine urine ratio  Chronic atrial fibrillation (HCC)  Vitamin D deficiency - Plan: VITAMIN D 25 Hydroxy (Vit-D Deficiency, Fractures)  Depression screening  Class 1 obesity with serious comorbidity and body mass index (BMI) of 30.0 to 30.9 in adult, unspecified obesity type  PLAN:  Fatigue Zuzanna was informed that her fatigue may be related to obesity, depression or many other causes. Labs will be ordered, and in the meanwhile Maryanne has agreed to work on diet, exercise and weight loss to help with fatigue. Proper sleep hygiene was discussed including the need for 7-8 hours of quality sleep each night. A sleep study was not ordered based on symptoms and Epworth score.  Dyspnea on exertion Viktorya's shortness of breath appears to be obesity related and exercise induced. She has agreed to work on weight loss and gradually increase exercise to treat her exercise induced shortness of breath. If Sally follows  our instructions and loses weight without improvement of her shortness of breath, we will plan to refer to pulmonology. We will monitor this condition regularly. Nakaya agrees to this plan.  Vitamin D Deficiency Demiana was informed that low vitamin D levels contributes to fatigue and are associated with obesity, breast, and colon cancer. We will check labs and will follow up for routine testing of vitamin D, at least 2-3 times per year.   Atrial Fibrillation Elya was advised weight loss will improve cardiac function but she should avoid exercise unless it is casual walking until her Afib is under better control.  Diabetes II Angelys has been given extensive diabetes education by myself today including ideal fasting and post-prandial blood glucose readings, individual ideal Hgb A1c goals and hypoglycemia prevention. We discussed the importance of good blood sugar control to decrease the likelihood of diabetic complications such as nephropathy, neuropathy, limb loss, blindness, coronary artery disease, and death. We discussed the importance of intensive lifestyle modification including diet, exercise and weight loss as the first line treatment for diabetes. Cheyanna agrees to continue with diet, exercise and weight loss to help control diabetes and will check BGS 2 times daily and will write down her daily dose of Glargine. Avacyn agrees to continue her diabetes medications and will follow up at the agreed upon time.  Depression Screen Kaelen had a strongly positive depression screening. Depression is commonly associated with obesity and often results in emotional eating behaviors. We will monitor this closely and work on CBT to help improve the non-hunger eating  patterns. Referral to Psychology may be required if no improvement is seen as she continues in our clinic.  Obesity Amiyah is currently in the action stage of change and her goal is to continue with weight loss efforts She has agreed to follow the  Category 1 plan +100 calories Karlee has been instructed to work up to a goal of 150 minutes of combined cardio and strengthening exercise per week for weight loss and overall health benefits. We discussed the following Behavioral Modification Strategies today: increasing lean protein intake, decreasing simple carbohydrates  and decrease eating out  Tayva has agreed to follow up with our clinic in 2 weeks. She was informed of the importance of frequent follow up visits to maximize her success with intensive lifestyle modifications for her multiple health conditions. She was informed we would discuss her lab results at her next visit unless there is a critical issue that needs to be addressed sooner. Chalet agreed to keep her next visit at the agreed upon time to discuss these results.  I, Doreene Nest, am acting as transcriptionist for Dennard Nip, MD  I have reviewed the above documentation for accuracy and completeness, and I agree with the above. -Dennard Nip, MD    OBESITY BEHAVIORAL INTERVENTION VISIT  Today's visit was # 1 out of 51.  Starting weight: 185 lbs Starting date: 11/20/16 Today's weight : 185 lbs Today's date: 11/20/2016 Total lbs lost to date: 0 (Patients must lose 7 lbs in the first 6 months to continue with counseling)   ASK: We discussed the diagnosis of obesity with [name] today and Malyn agreed to give [name] permission to discuss obesity behavioral modification therapy today.  ASSESS: Sherolyn has the diagnosis of obesity and her BMI today is 30.79 Pasha is in the action stage of change   ADVISE: Valisa was educated on the multiple health risks of obesity as well as the benefit of weight loss to improve her health. She was advised of the need for long term treatment and the importance of lifestyle modifications.  AGREE: Multiple dietary modification options and treatment options were discussed and  Davinity agreed to follow the Category 1 plan +100  calories We discussed the following Behavioral Modification Strategies today: increasing lean protein intake, decreasing simple carbohydrates  and decrease eating out

## 2016-11-22 LAB — LIPID PANEL WITH LDL/HDL RATIO
Cholesterol, Total: 147 mg/dL (ref 100–199)
HDL: 47 mg/dL (ref >39)
LDL Calculated: 89 mg/dL (ref 0–99)
LDl/HDL Ratio: 1.9 ratio (ref 0.0–3.2)
Triglycerides: 57 mg/dL (ref 0–149)
VLDL Cholesterol Cal: 11 mg/dL (ref 5–40)

## 2016-11-22 LAB — COMPREHENSIVE METABOLIC PANEL
A/G RATIO: 1.7 (ref 1.2–2.2)
ALT: 13 IU/L (ref 0–32)
AST: 16 IU/L (ref 0–40)
Albumin: 4.3 g/dL (ref 3.6–4.8)
Alkaline Phosphatase: 64 IU/L (ref 39–117)
BILIRUBIN TOTAL: 0.3 mg/dL (ref 0.0–1.2)
BUN/Creatinine Ratio: 21 (ref 12–28)
BUN: 20 mg/dL (ref 8–27)
CALCIUM: 9.5 mg/dL (ref 8.7–10.3)
CHLORIDE: 99 mmol/L (ref 96–106)
CO2: 23 mmol/L (ref 20–29)
Creatinine, Ser: 0.96 mg/dL (ref 0.57–1.00)
GFR calc Af Amer: 70 mL/min/{1.73_m2} (ref >59)
GFR, EST NON AFRICAN AMERICAN: 61 mL/min/{1.73_m2} (ref >59)
GLUCOSE: 122 mg/dL — AB (ref 65–99)
Globulin, Total: 2.6 g/dL (ref 1.5–4.5)
POTASSIUM: 4.5 mmol/L (ref 3.5–5.2)
Sodium: 139 mmol/L (ref 134–144)
Total Protein: 6.9 g/dL (ref 6.0–8.5)

## 2016-11-22 LAB — HEMOGLOBIN A1C
ESTIMATED AVERAGE GLUCOSE: 137 mg/dL
Hgb A1c MFr Bld: 6.4 % — ABNORMAL HIGH (ref 4.8–5.6)

## 2016-11-22 LAB — VITAMIN D 25 HYDROXY (VIT D DEFICIENCY, FRACTURES): VIT D 25 HYDROXY: 39.8 ng/mL (ref 30.0–100.0)

## 2016-11-22 LAB — MICROALBUMIN / CREATININE URINE RATIO
CREATININE, UR: 55.4 mg/dL
Microalbumin, Urine: <3 ug/mL

## 2016-11-22 LAB — INSULIN, RANDOM: INSULIN: 6 u[IU]/mL (ref 2.6–24.9)

## 2016-11-25 ENCOUNTER — Ambulatory Visit: Payer: Commercial Managed Care - HMO | Admitting: Anesthesiology

## 2016-11-25 ENCOUNTER — Ambulatory Visit
Admission: RE | Admit: 2016-11-25 | Discharge: 2016-11-25 | Disposition: A | Discharge status: Home / Self Care | Payer: Commercial Managed Care - HMO | Source: Ambulatory Visit | Attending: Cardiovascular Disease | Admitting: Cardiovascular Disease

## 2016-11-25 ENCOUNTER — Encounter: Payer: Self-pay | Admitting: *Deleted

## 2016-11-25 ENCOUNTER — Encounter
Admission: RE | Disposition: A | Discharge status: Home / Self Care | Payer: Self-pay | Source: Ambulatory Visit | Attending: Cardiovascular Disease

## 2016-11-25 DIAGNOSIS — Z90710 Acquired absence of both cervix and uterus: Secondary | ICD-10-CM | POA: Diagnosis not present

## 2016-11-25 DIAGNOSIS — Z7901 Long term (current) use of anticoagulants: Secondary | ICD-10-CM | POA: Insufficient documentation

## 2016-11-25 DIAGNOSIS — E119 Type 2 diabetes mellitus without complications: Secondary | ICD-10-CM | POA: Insufficient documentation

## 2016-11-25 DIAGNOSIS — D649 Anemia, unspecified: Secondary | ICD-10-CM | POA: Diagnosis not present

## 2016-11-25 DIAGNOSIS — I10 Essential (primary) hypertension: Secondary | ICD-10-CM | POA: Insufficient documentation

## 2016-11-25 DIAGNOSIS — Z794 Long term (current) use of insulin: Secondary | ICD-10-CM | POA: Diagnosis not present

## 2016-11-25 DIAGNOSIS — Z8249 Family history of ischemic heart disease and other diseases of the circulatory system: Secondary | ICD-10-CM | POA: Diagnosis not present

## 2016-11-25 DIAGNOSIS — Z9049 Acquired absence of other specified parts of digestive tract: Secondary | ICD-10-CM | POA: Insufficient documentation

## 2016-11-25 DIAGNOSIS — I491 Atrial premature depolarization: Secondary | ICD-10-CM | POA: Diagnosis not present

## 2016-11-25 DIAGNOSIS — I480 Paroxysmal atrial fibrillation: Secondary | ICD-10-CM | POA: Insufficient documentation

## 2016-11-25 DIAGNOSIS — Z801 Family history of malignant neoplasm of trachea, bronchus and lung: Secondary | ICD-10-CM | POA: Diagnosis not present

## 2016-11-25 DIAGNOSIS — Z888 Allergy status to other drugs, medicaments and biological substances status: Secondary | ICD-10-CM | POA: Insufficient documentation

## 2016-11-25 DIAGNOSIS — E669 Obesity, unspecified: Secondary | ICD-10-CM | POA: Insufficient documentation

## 2016-11-25 DIAGNOSIS — Z823 Family history of stroke: Secondary | ICD-10-CM | POA: Diagnosis not present

## 2016-11-25 DIAGNOSIS — K5900 Constipation, unspecified: Secondary | ICD-10-CM | POA: Insufficient documentation

## 2016-11-25 DIAGNOSIS — K449 Diaphragmatic hernia without obstruction or gangrene: Secondary | ICD-10-CM | POA: Insufficient documentation

## 2016-11-25 DIAGNOSIS — Z8049 Family history of malignant neoplasm of other genital organs: Secondary | ICD-10-CM | POA: Insufficient documentation

## 2016-11-25 DIAGNOSIS — Z6830 Body mass index (BMI) 30.0-30.9, adult: Secondary | ICD-10-CM | POA: Diagnosis not present

## 2016-11-25 DIAGNOSIS — Z79899 Other long term (current) drug therapy: Secondary | ICD-10-CM | POA: Diagnosis not present

## 2016-11-25 DIAGNOSIS — K648 Other hemorrhoids: Secondary | ICD-10-CM | POA: Diagnosis not present

## 2016-11-25 DIAGNOSIS — F329 Major depressive disorder, single episode, unspecified: Secondary | ICD-10-CM | POA: Insufficient documentation

## 2016-11-25 DIAGNOSIS — Z833 Family history of diabetes mellitus: Secondary | ICD-10-CM | POA: Insufficient documentation

## 2016-11-25 DIAGNOSIS — K5790 Diverticulosis of intestine, part unspecified, without perforation or abscess without bleeding: Secondary | ICD-10-CM | POA: Diagnosis not present

## 2016-11-25 DIAGNOSIS — E7800 Pure hypercholesterolemia, unspecified: Secondary | ICD-10-CM | POA: Insufficient documentation

## 2016-11-25 DIAGNOSIS — I481 Persistent atrial fibrillation: Secondary | ICD-10-CM

## 2016-11-25 DIAGNOSIS — G4730 Sleep apnea, unspecified: Secondary | ICD-10-CM | POA: Insufficient documentation

## 2016-11-25 HISTORY — PX: CARDIOVERSION: EP1203

## 2016-11-25 LAB — GLUCOSE, CAPILLARY: GLUCOSE-CAPILLARY: 100 mg/dL — AB (ref 65–99)

## 2016-11-25 SURGERY — CARDIOVERSION (CATH LAB)
Anesthesia: General

## 2016-11-25 MED ORDER — LIDOCAINE HCL (PF) 2 % IJ SOLN
INTRAMUSCULAR | Status: AC
  Filled 2016-11-25: qty 2

## 2016-11-25 MED ORDER — FENTANYL CITRATE (PF) 100 MCG/2ML IJ SOLN: 25.0000 ug | INTRAMUSCULAR | Status: DC | PRN

## 2016-11-25 MED ORDER — SODIUM CHLORIDE 0.9 % IV SOLN
INTRAVENOUS | Status: DC
  Administered 2016-11-25: 07:00:00 via INTRAVENOUS

## 2016-11-25 MED ORDER — ONDANSETRON HCL 4 MG/2ML IJ SOLN: 4.0000 mg | Freq: Once | INTRAMUSCULAR | Status: DC | PRN

## 2016-11-25 MED ORDER — PROPOFOL 10 MG/ML IV BOLUS
INTRAVENOUS | Status: DC | PRN
  Administered 2016-11-25: 50 mg via INTRAVENOUS

## 2016-11-25 MED ORDER — DEXTROSE 5 % IV SOLN
INTRAVENOUS | Status: DC
  Administered 2016-11-25: 08:00:00 via INTRAVENOUS

## 2016-11-25 MED ORDER — LIDOCAINE HCL (CARDIAC) 20 MG/ML IV SOLN
INTRAVENOUS | Status: DC | PRN
  Administered 2016-11-25: 40 mg via INTRAVENOUS

## 2016-11-25 MED ORDER — PROPOFOL 10 MG/ML IV BOLUS
INTRAVENOUS | Status: AC
  Filled 2016-11-25: qty 20

## 2016-11-25 NOTE — Discharge Instructions (Signed)
Electrical Cardioversion, Care After   To follow up with cardiology at [facility], for possible ablation This sheet gives you information about how to care for yourself after your procedure. Your health care provider may also give you more specific instructions. If you have problems or questions, contact your health care provider. What can I expect after the procedure? After the procedure, it is common to have:  Some redness on the skin where the shocks were given.  Follow these instructions at home:  Do not drive for 24 hours if you were given a medicine to help you relax (sedative).  Take over-the-counter and prescription medicines only as told by your health care provider.  Ask your health care provider how to check your pulse. Check it often.  Rest for 48 hours after the procedure or as told by your health care provider.  Avoid or limit your caffeine use as told by your health care provider. Contact a health care provider if:  You feel like your heart is beating too quickly or your pulse is not regular.  You have a serious muscle cramp that does not go away. Get help right away if:  You have discomfort in your chest.  You are dizzy or you feel faint.  You have trouble breathing or you are short of breath.  Your speech is slurred.  You have trouble moving an arm or leg on one side of your body.  Your fingers or toes turn cold or blue. This information is not intended to replace advice given to you by your health care provider. Make sure you discuss any questions you have with your health care provider. Document Released: 12/09/2012 Document Revised: 09/22/2015 Document Reviewed: 08/25/2015 Elsevier Interactive Patient Education  [employer].

## 2016-11-25 NOTE — Transfer of Care (Signed)
Immediate Anesthesia Transfer of Care Note  Patient: [name]  Procedure(s) Performed: Procedure(s): CARDIOVERSION (N/A)  Patient Location: PACU  Anesthesia Type:General  Level of Consciousness: sedated  Airway & Oxygen Therapy: Patient Spontanous Breathing and Patient connected to nasal cannula oxygen  Post-op Assessment: Report given to RN and Post -op Vital signs reviewed and stable  Post vital signs: Reviewed and stable  Last Vitals:  Vitals:   11/25/16 0739 11/25/16 0740  BP: (!) 91/53 (!) 91/53  Pulse: (!) 53 (!) 53  Resp: 18 15  Temp:    SpO2: (!) 89% 94%    Last Pain: There were no vitals filed for this visit.       Complications: No apparent anesthesia complications

## 2016-11-25 NOTE — Anesthesia Post-op Follow-up Note (Signed)
Anesthesia QCDR form completed.        

## 2016-11-25 NOTE — Anesthesia Postprocedure Evaluation (Signed)
Anesthesia Post Note  Patient: [name]  Procedure(s) Performed: Procedure(s) (LRB): CARDIOVERSION (N/A)  Patient location during evaluation: PACU Anesthesia Type: General Level of consciousness: awake and alert Pain management: pain level controlled Vital Signs Assessment: post-procedure vital signs reviewed and stable Respiratory status: spontaneous breathing, nonlabored ventilation, respiratory function stable and patient connected to nasal cannula oxygen Cardiovascular status: blood pressure returned to baseline and stable Postop Assessment: no apparent nausea or vomiting Anesthetic complications: no     Last Vitals:  Vitals:   11/25/16 0745 11/25/16 0800  BP: (!) 95/46 (!) 108/52  Pulse: (!) 46 (!) 52  Resp: 16 15  Temp:    SpO2: 98%     Last Pain: There were no vitals filed for this visit.               Molli Barrows

## 2016-11-25 NOTE — Interval H&P Note (Signed)
History and Physical Interval Note:  11/25/2016 8:41 AM  [name]  has presented today for surgery, with the diagnosis of Cardioversion   Afib  The various methods of treatment have been discussed with the patient and family. After consideration of risks, benefits and other options for treatment, the patient has consented to  Procedure(s): CARDIOVERSION (N/A) as a surgical intervention .  The patient's history has been reviewed, patient examined, no change in status, stable for surgery.  I have reviewed the patient's chart and labs.  Questions were answered to the patient's satisfaction.     Kathlyn [place]

## 2016-11-25 NOTE — H&P (View-Only) (Signed)
Primary Care Physician: Jinny Sanders, MD Referring Physician: Dr. Onnie Graham is a 71 y.o. female with a h/o Depressive disorder, DM, HTN, paroxysmal afib, on flecainide, since fall of 2017. She went to afib several days ago, no know trigger. She is on xarelto with a chadsvasc score of at least at least 4. No missed doses. She runs fast in afib , despite increase of cardizem to 240 mg daily as well as atenolol, v rate remains at 130 bpm. She feels very fatigued in atrial fib. She has required cardioversion last Nov and again in April. She does have OSA and does wear CPAP. No excessive caffeine, no alcohol. LA size 44 mm.  Today, she denies symptoms of chest pain,  orthopnea, PND, lower extremity edema, dizziness, presyncope, syncope, or neurologic sequela. Positive for palpitations, shortness of breath with exertion, fatigue. The patient is tolerating medications without difficulties and is otherwise without complaint today.   Past Medical History:  Diagnosis Date  . Depressive disorder, not elsewhere classified   . Diabetes mellitus without mention of complication   . Diaphragmatic hernia without mention of obstruction or gangrene   . Disorders of bursae and tendons in shoulder region, unspecified   . Diverticulosis of colon (without mention of hemorrhage)   . Headache(784.0)   . Internal hemorrhoids without mention of complication   . Obesity, unspecified   . Other urinary incontinence   . Paroxysmal atrial fibrillation (HCC)   . Pure hypercholesterolemia    Past Surgical History:  Procedure Laterality Date  . ABDOMINAL HYSTERECTOMY  1972   Have both ovaries  . BREAST SURGERY     Reduction  . [place] N/A 05/27/2016   Procedure: CARDIOVERSION;  Surgeon: [name], MD;  Location: [facility] ORS;  Service: Cardiovascular;  Laterality: N/A;  . CATARACT EXTRACTION Right    left scheduled for 07/2016  . CHOLECYSTECTOMY    .  ELECTROPHYSIOLOGIC STUDY N/A 01/24/2016   Procedure: CARDIOVERSION;  Surgeon: [name], MD;  Location: [facility] ORS;  Service: Cardiovascular;  Laterality: N/A;  . REDUCTION MAMMAPLASTY Bilateral   . TONSILLECTOMY      Current Outpatient Prescriptions  Medication Sig Dispense Refill  . atorvastatin (LIPITOR) 10 MG tablet TAKE ONE TABLET BY MOUTH EVERY NIGHT AT BEDTIME 90 tablet 3  . Calcium Carb-Ergocalciferol 500-200 MG-UNIT TABS Take by mouth.    . diltiazem (CARDIZEM CD) 240 MG 24 hr capsule Take 1 capsule (240 mg total) by mouth daily. 90 capsule 3  . ELIQUIS 5 MG TABS tablet TAKE 1 TABLET BY MOUTH TWICE A DAY 60 tablet 3  . escitalopram (LEXAPRO) 10 MG tablet TAKE ONE TABLET BY MOUTH ONCE DAILY 90 tablet 1  . ferrous sulfate 325 (65 FE) MG tablet Take 1 tablet (325 mg total) by mouth daily with breakfast. 90 tablet 3  . flecainide (TAMBOCOR) 100 MG tablet TAKE 1 TABLET (100 MG TOTAL) BY MOUTH 2 (TWO) TIMES DAILY. 180 tablet 0  . glucose blood (ONE TOUCH ULTRA TEST) test strip CHECK BLOOD SUGAR 2 TIMES A DAY AS DIRECTED 200 each 3  . Insulin Glargine (LANTUS SOLOSTAR) 100 UNIT/ML Solostar Pen Inject 12-14 Units into the skin daily at 10 pm. 30 mL 5  . Insulin Pen Needle (B-D ULTRAFINE III SHORT PEN) 31G X 8 MM MISC Use as directed Dx:250.00 100 each 6  . linagliptin (TRADJENTA) 5 MG TABS tablet Take 1 tablet (5 mg  total) by mouth daily. 30 tablet 11  . metFORMIN (GLUCOPHAGE-XR) 500 MG 24 hr tablet Take 4 tablets (2,000 mg total) by mouth daily with supper. (Patient taking differently: Take 1,000 mg by mouth 2 (two) times daily. ) 360 tablet 3  . Multiple Vitamin (MULTIVITAMIN) tablet Take 1 tablet by mouth daily.      [name] oxybutynin (DITROPAN-XL) 5 MG 24 hr tablet TAKE ONE TABLET BY MOUTH EVERY NIGHT AT BEDTIME 30 tablet 11  . ranitidine (ZANTAC) 150 MG tablet Take 150 mg by mouth daily.      . traZODone (DESYREL) 50 MG tablet Take 1-2 tablets (50-100 mg total) by mouth at bedtime as  needed for sleep. 60 tablet 5   No current facility-administered medications for this encounter.     Allergies  Allergen Reactions  . Cortisone   . Pravastatin Sodium     REACTION: leg pain    Social History   Social History  . Marital status: Married    Spouse name: N/A  . Number of children: 2  . Years of education: N/A   Occupational History  . 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 Smoking status: Never Smoker  . Smokeless tobacco: Never Used  . Alcohol use No  . Drug use: No  . Sexual activity: Yes   Other Topics Concern  . Not on file   Social History Narrative   Exercise walks 3 times daily         Diet fruit, occ fast food, salads and lean meat    Family History  Problem Relation Age of Onset  . Cancer Mother        fullopian tube  . Dementia Mother   . Pulmonary embolism Mother   . Hyperlipidemia Mother   . Cancer Father        lung  . Hyperlipidemia Father   . Hypertension Sister   . Diabetes Brother   . Stroke Paternal Grandmother   . Stroke Paternal Grandfather     ROS- All systems are reviewed and negative except as per the HPI above  Physical Exam: Vitals:   11/18/16 1003  BP: 122/64  Pulse: (!) 130  Weight: 191 lb 12.8 oz (87 kg)  Height: 5\' 5"  (1.651 m)   Wt Readings from Last 3 Encounters:  11/18/16 191 lb 12.8 oz (87 kg)  11/14/16 191 lb (86.6 kg)  09/16/16 196 lb (88.9 kg)    Labs: Lab Results  Component Value Date   NA 138 05/10/2016   K 4.6 05/10/2016   CL 103 05/10/2016   CO2 29 05/10/2016   GLUCOSE 82 05/10/2016   BUN 19 05/10/2016   CREATININE 1.03 05/10/2016   CALCIUM 9.6 05/10/2016   No results found for: INR Lab Results  Component Value Date   CHOL 133 05/10/2016   HDL 43.20 05/10/2016   LDLCALC 74 05/10/2016   TRIG 76.0 05/10/2016     GEN- The patient is well appearing, alert and oriented x 3 today.   Head- normocephalic, atraumatic Eyes-  Sclera clear, conjunctiva  pink Ears- hearing intact Oropharynx- clear Neck- supple, no JVP Lymph- no cervical lymphadenopathy Lungs- Clear to ausculation bilaterally, normal work of breathing Heart- irregular rate and rhythm, no murmurs, rubs or gallops, PMI not laterally displaced GI- soft, NT, ND, + BS Extremities- no clubbing, cyanosis, or edema MS- no significant deformity or atrophy Skin- no rash or lesion Psych- euthymic mood, full affect Neuro- strength and sensation  are intact  EKG- Afib with rvr at 130 bpm, qrs int 96 ms, qtc 497 ms. Epic records reviewed    Assessment and Plan: 1. Paroxysmal afib Required cardioversion in Nov 2017,this past April as well as in afib now I feel that flecainide is being ineffective to keep pt in rhythm Discussed options to restore SR, antiarrythmic's, Tikosyn or sotalol, feel she is on the young side for  Amiodarone I also discussed ablation as I feel she would be an acceptable candidate The pt would like to pursue cardioversion so she can feel better sooner than later, then see Dr. Lamount Cohen for an ablation,as the ablation may take weeks to put in place. No missed doses of eliquis. Bmet,cbc,tsh today Continue  flecainide at 100 mg bid as well as Cardizem and atenolol at current doses  [place] office contacted to help set up at [facility], as this would be more convenient  for her I will request an appointment with Dr. Rayann Heman in the next 2-3 weeks to discuss ablation  Butch Penny C. Shannon Kirkendall, [facility] [address] [place], [place] [address] [phone]

## 2016-11-25 NOTE — Anesthesia Preprocedure Evaluation (Signed)
Anesthesia Evaluation  Patient identified by MRN, date of birth, ID band Patient awake    Reviewed: Allergy & Precautions, H&P , NPO status , Patient's Chart, lab work & pertinent test results, reviewed documented beta blocker date and time   Airway Mallampati: II   Neck ROM: full    Dental  (+) Poor Dentition   Pulmonary neg pulmonary ROS, sleep apnea ,    Pulmonary exam normal        Cardiovascular Exercise Tolerance: Poor hypertension, On Medications negative cardio ROS Normal cardiovascular examAtrial Fibrillation  Rhythm:regular Rate:Normal     Neuro/Psych  Headaches, PSYCHIATRIC DISORDERS  Neuromuscular disease negative neurological ROS  negative psych ROS   GI/Hepatic negative GI ROS, Neg liver ROS,   Endo/Other  negative endocrine ROSdiabetesHypothyroidism   Renal/GU negative Renal ROS  negative genitourinary   Musculoskeletal   Abdominal   Peds  Hematology negative hematology ROS (+) anemia ,   Anesthesia Other Findings Past Medical History: No date: Anemia No date: Constipation No date: Depressive disorder, not elsewhere classified No date: Diabetes mellitus without mention of complication No date: Diaphragmatic hernia without mention of obstruction or  gangrene No date: Disorders of bursae and tendons in shoulder region,  unspecified No date: Diverticulosis of colon (without mention of hemorrhage) No date: Gallbladder problem No date: Headache(784.0) No date: HTN (hypertension) No date: Hypothyroidism No date: Internal hemorrhoids without mention of complication No date: Obesity, unspecified No date: Other urinary incontinence No date: Palpitations No date: Paroxysmal atrial fibrillation (HCC) No date: Pure hypercholesterolemia No date: Sleep apnea No date: Vitamin B 12 deficiency Past Surgical History: 1972: ABDOMINAL HYSTERECTOMY     Comment:  Have both ovaries No date: BREAST SURGERY    Comment:  Reduction No date: CARDIAC CATHETERIZATION     Comment:  [place] 05/27/2016: CARDIOVERSION; N/A     Comment:  Procedure: CARDIOVERSION;  Surgeon: [name],               MD;  Location: [facility] ORS;  Service: Cardiovascular;                Laterality: N/A; No date: CATARACT EXTRACTION; Right     Comment:  left scheduled for 07/2016 No date: CHOLECYSTECTOMY 01/24/2016: ELECTROPHYSIOLOGIC STUDY; N/A     Comment:  Procedure: CARDIOVERSION;  Surgeon: [name],               MD;  Location: [facility] ORS;  Service: Cardiovascular;                Laterality: N/A; No date: REDUCTION MAMMAPLASTY; Bilateral No date: TONSILLECTOMY BMI    Body Mass Index:  30.95 kg/m     Reproductive/Obstetrics negative OB ROS                             Anesthesia Physical Anesthesia Plan  ASA: IV  Anesthesia Plan: General   Post-op Pain Management:    Induction:   PONV Risk Score and Plan:   Airway Management Planned:   Additional Equipment:   Intra-op Plan:   Post-operative Plan:   Informed Consent: I have reviewed the patients History and Physical, chart, labs and discussed the procedure including the risks, benefits and alternatives for the proposed anesthesia with the patient or authorized representative who has indicated his/her understanding and acceptance.   Dental Advisory Given  Plan Discussed with: CRNA  Anesthesia Plan Comments:  Anesthesia Quick Evaluation  

## 2016-11-25 NOTE — CV Procedure (Signed)
Cardioversion note: A standard informed consent was obtained. Timeout was performed. The pads were placed in the anterior posterior fashion. The patient was given propofol by the anesthesia team.  Successful cardioversion was performed with a 200 J. The patient converted to sinus rhythm. Pre-and post EKGs were reviewed. The patient tolerated the procedure with no immediate complications.  Recommendations: Decrease diltiazem ER back to 180 mg and follow-up with A-fib clinic to discuss ablation.

## 2016-11-29 ENCOUNTER — Other Ambulatory Visit: Payer: Self-pay | Admitting: Cardiovascular Disease

## 2016-12-02 ENCOUNTER — Ambulatory Visit (INDEPENDENT_AMBULATORY_CARE_PROVIDER_SITE_OTHER): Payer: 59 | Admitting: Internal Medicine

## 2016-12-02 ENCOUNTER — Encounter: Payer: Self-pay | Admitting: Internal Medicine

## 2016-12-02 VITALS — BP 136/70 | HR 56 | Ht 65.0 in | Wt 186.2 lb

## 2016-12-02 DIAGNOSIS — I481 Persistent atrial fibrillation: Secondary | ICD-10-CM

## 2016-12-02 DIAGNOSIS — I340 Nonrheumatic mitral (valve) insufficiency: Secondary | ICD-10-CM | POA: Diagnosis not present

## 2016-12-02 DIAGNOSIS — G4733 Obstructive sleep apnea (adult) (pediatric): Secondary | ICD-10-CM | POA: Diagnosis not present

## 2016-12-02 DIAGNOSIS — I10 Essential (primary) hypertension: Secondary | ICD-10-CM | POA: Diagnosis not present

## 2016-12-02 DIAGNOSIS — I4819 Other persistent atrial fibrillation: Secondary | ICD-10-CM

## 2016-12-02 NOTE — Progress Notes (Signed)
Electrophysiology Office Note   Date:  12/02/2016   ID:  [name], DOB 05/10/1945, MRN [id number]  PCP:  Jinny Sanders, MD  Cardiologist:  Dr Fletcher Anon Primary Electrophysiologist: Thompson Grayer, MD    Chief Complaint  Patient presents with  . Atrial Fibrillation     History of Present Illness: [name] is a 71 y.o. female who presents today for electrophysiology evaluation.   The patient is referred by Dr Fletcher Anon and [name] NP in the AF clinic for afib management consultation.  She reports initially having atrial fibrillation 06/2015 after developing tachypalpitations with dizziness.  She was evaluated and hospitalized.  She required cardioversion.  She was placed on diltiazem and subsequently flecainide.  She has recurrent afib November 2017 and also August 2018 for which she was again symptomatic and required cardioversion.  She is unaware of triggers/ precipitants.  She does not drink ETOH.  She has OSA but uses CPAP.  Today, she denies symptoms of palpitations, chest pain, shortness of breath, orthopnea, PND, lower extremity edema, claudication, dizziness, presyncope, syncope, bleeding, or neurologic sequela. The patient is tolerating medications without difficulties and is otherwise without complaint today.    Past Medical History:  Diagnosis Date  . Anemia   . Constipation   . Depressive disorder, not elsewhere classified   . Diabetes mellitus without mention of complication   . Diaphragmatic hernia without mention of obstruction or gangrene   . Disorders of bursae and tendons in shoulder region, unspecified   . Diverticulosis of colon (without mention of hemorrhage)   . HTN (hypertension)   . Hypothyroidism   . Internal hemorrhoids without mention of complication   . Obesity, unspecified   . Other urinary incontinence   . Persistent atrial fibrillation ([place])   . Pure hypercholesterolemia   . Sleep apnea    compliant with CPAP  . Vitamin B 12 deficiency      Past Surgical History:  Procedure Laterality Date  . ABDOMINAL HYSTERECTOMY  1972   Have both ovaries  . BREAST SURGERY     Reduction  . [place] N/A 05/27/2016   Procedure: CARDIOVERSION;  Surgeon: [name], MD;  Location: [facility] ORS;  Service: Cardiovascular;  Laterality: N/A;  . CARDIOVERSION N/A 11/25/2016   Procedure: CARDIOVERSION;  Surgeon: [name], MD;  Location: [facility] ORS;  Service: Cardiovascular;  Laterality: N/A;  . CATARACT EXTRACTION Right    left scheduled for 07/2016  . CHOLECYSTECTOMY    . ELECTROPHYSIOLOGIC STUDY N/A 01/24/2016   Procedure: CARDIOVERSION;  Surgeon: [name], MD;  Location: [facility] ORS;  Service: Cardiovascular;  Laterality: N/A;  . REDUCTION MAMMAPLASTY Bilateral   . TONSILLECTOMY       Current Outpatient Prescriptions  Medication Sig Dispense Refill  . atorvastatin (LIPITOR) 10 MG tablet TAKE ONE TABLET BY MOUTH EVERY NIGHT AT BEDTIME 90 tablet 3  . Calcium Carb-Ergocalciferol 500-200 MG-UNIT TABS Take 1 tablet by mouth daily.     [name] diltiazem (CARDIZEM CD) 180 MG 24 hr capsule TAKE 1 CAPSULE BY MOUTH EVERY DAY (NEED TO SCHEDULE APPT [id number]) 30 capsule 2  . ELIQUIS 5 MG TABS tablet TAKE 1 TABLET BY MOUTH TWICE A DAY 60 tablet 3  . escitalopram (LEXAPRO) 10 MG tablet TAKE ONE TABLET BY MOUTH ONCE DAILY 90 tablet 1  . ferrous sulfate 325 (65 FE) MG tablet Take 325 mg by mouth every other day.    [name]  flecainide (TAMBOCOR) 100 MG tablet TAKE 1 TABLET (100 MG TOTAL) BY MOUTH 2 (TWO) TIMES DAILY. 180 tablet 0  . glucose blood (ONE TOUCH ULTRA TEST) test strip CHECK BLOOD SUGAR 2 TIMES A DAY AS DIRECTED 200 each 3  . Insulin Glargine (LANTUS) 100 UNIT/ML Solostar Pen Inject into the skin as directed.    . Insulin Pen Needle (B-D ULTRAFINE III SHORT PEN) 31G X 8 MM MISC Use as directed Dx:250.00 100 each 6  . linagliptin (TRADJENTA) 5 MG TABS tablet Take 1 tablet (5 mg total) by mouth daily.  30 tablet 11  . metFORMIN (GLUCOPHAGE-XR) 500 MG 24 hr tablet Take 4 tablets (2,000 mg total) by mouth daily with supper. (Patient taking differently: Take 1,000 mg by mouth 2 (two) times daily. ) 360 tablet 3  . Multiple Vitamin (MULTIVITAMIN) tablet Take 1 tablet by mouth daily.      [name] oxybutynin (DITROPAN-XL) 5 MG 24 hr tablet TAKE ONE TABLET BY MOUTH EVERY NIGHT AT BEDTIME 30 tablet 11  . ranitidine (ZANTAC) 150 MG tablet Take 150 mg by mouth daily.      . sennosides-docusate sodium (SENOKOT-S) 8.6-50 MG tablet Take 1 tablet by mouth daily as needed for constipation.    . traZODone (DESYREL) 50 MG tablet Take 1-2 tablets (50-100 mg total) by mouth at bedtime as needed for sleep. 60 tablet 5   No current facility-administered medications for this visit.     Allergies:   Pravastatin sodium and Cortisone   Social History:  The patient  reports that she has never smoked. She has never used smokeless tobacco. She reports that she does not drink alcohol or use drugs.   Family History:  The patient's  family history includes Cancer in her father and mother; Dementia in her mother; Diabetes in her brother and mother; Hyperlipidemia in her father and mother; Hypertension in her sister; Pulmonary embolism in her mother; Stroke in her paternal grandfather and paternal grandmother.    ROS:  Please see the history of present illness.   All other systems are personally reviewed and negative.    PHYSICAL EXAM: VS:  BP 136/70   Pulse (!) 56   Ht 5\' 5"  (1.651 m)   Wt 186 lb 3.2 oz (84.5 kg)   LMP  (LMP Unknown)   SpO2 98%   BMI 30.99 kg/m  , BMI Body mass index is 30.99 kg/m. GEN: Well nourished, well developed, in no acute distress  HEENT: normal  Neck: no JVD, carotid bruits, or masses Cardiac: RRR; no murmurs, rubs, or gallops,no edema  Respiratory:  clear to auscultation bilaterally, normal work of breathing GI: soft, nontender, nondistended, + BS MS: no deformity or atrophy  Skin: warm  and dry  Neuro:  Strength and sensation are intact Psych: euthymic mood, full affect  EKG:  EKG is ordered today. The ekg ordered today is personally reviewed and shows sinus rhythm 56 bpm, PR 228 msec, incomplete RBBB   Recent Labs: 01/24/2016: B Natriuretic Peptide 181.0 11/18/2016: Hemoglobin 11.3; Platelets 291; TSH 2.719 11/20/2016: ALT 13; BUN 20; Creatinine, Ser 0.96; Potassium 4.5; Sodium 139  personally reviewed   Lipid Panel     Component Value Date/Time   CHOL 147 11/20/2016 0919   TRIG 57 11/20/2016 0919   HDL 47 11/20/2016 0919   CHOLHDL 3 05/10/2016 1313   VLDL 15.2 05/10/2016 1313   LDLCALC 89 11/20/2016 0919   LDLDIRECT 163.1 07/04/2011 0922   personally reviewed   Wt Readings from  Last 3 Encounters:  12/02/16 186 lb 3.2 oz (84.5 kg)  11/25/16 186 lb (84.4 kg)  11/20/16 185 lb (83.9 kg)      Other studies personally reviewed: Additional studies/ records that were reviewed today include: AF clinic notes, prior echo, Dr [name] Antonio notes  Review of the above records today demonstrates: preserved EF, mild to moderate MR   ASSESSMENT AND PLAN:  1.  Persistent afib The patient has symptomatic persistent afib.  She has failed medical therapy with flecainide and diltiazem Therapeutic strategies for afib including medicine and ablation were discussed in detail with the patient today. Risk, benefits, and alternatives to EP study and radiofrequency ablation for afib were also discussed in detail today. These risks include but are not limited to stroke, bleeding, vascular damage, tamponade, perforation, damage to the esophagus, lungs, and other structures, pulmonary vein stenosis, worsening renal function, and death. The patient understands these risk and wishes to proceed.  We will therefore proceed with catheter ablation at the next available time.  Carto, ICE, and anesthesia are required.  Cardiac CT is require prior to ablation.  2. HTN Stable No change required  today  3. Mild to moderate MR Stable No change required today  4. OSA Compliance with CPAP encouraged  5. overwight Body mass index is 30.99 kg/m. Lifestyle modification is encouraged    Current medicines are reviewed at length with the patient today.   The patient does not have concerns regarding her medicines.  The following changes were made today:  none    Signed, Thompson Grayer, MD  12/02/2016 11:58 AM     [facility] [address] [place]  [address] [phone] (office) [phone] (fax)

## 2016-12-02 NOTE — Patient Instructions (Signed)
Medication Instructions:  Your physician recommends that you continue on your current medications as directed. Please refer to the Current Medication list given to you today.   Labwork: Your physician recommends that you return for lab work today: BMP/CBC   Testing/Procedures: Your physician has requested that you have cardiac CT. Cardiac computed tomography (CT) is a painless test that uses an x-ray machine to take clear, detailed pictures of your heart. For further information please visit [facility]. Please follow instruction sheet as given.---week of 12/09/16 prior to his ablation scheduled for 12/17/16.  Office will call after insurance approves  Please arrive at the [facility] main entrance of [facility] at _____ AM (30-45 minutes prior to test start time)  [facility] [address] [place],  [address] [phone]  Proceed to the [facility] Radiology Department (First Floor).  Please follow these instructions carefully (unless otherwise directed):  Hold all erectile dysfunction medications at least 48 hours prior to test.  On the Night Before the Test: . Drink plenty of water. . Do not consume any caffeinated/decaffeinated beverages or chocolate 12 hours prior to your test. . Do not take any antihistamines 12 hours prior to your test. . If you take Metformin do not take 24 hours prior to test.   On the Day of the Test: . Drink plenty of water. Do not drink any water within one hour of the test. . Do not eat any food 4 hours prior to the test. . You may take your regular medications prior to the test.   After the Test: . Drink plenty of water. . After receiving IV contrast, you may experience a mild flushed feeling. This is normal. . On occasion, you may experience a mild rash up to 24 hours after the test. This is not dangerous. If this occurs, you can take Benadryl 25 mg and increase your fluid intake. . If you experience  trouble breathing, this can be serious. If it is severe call 911 IMMEDIATELY. If it is mild, please call our office. . If you take any of these medications: Glipizide/Metformin, Avandament, Glucavance, please do not take 48 hours after completing test.  .      Your physician has recommended that you have an ablation. Catheter ablation is a medical procedure used to treat some cardiac arrhythmias (irregular heartbeats). During catheter ablation, a long, thin, flexible tube is put into a blood vessel in your groin (upper thigh), or neck. This tube is called an ablation catheter. It is then guided to your heart through the blood vessel. Radio frequency waves destroy small areas of heart tissue where abnormal heartbeats may cause an arrhythmia to start. Please see the instruction sheet given to you today.---12/17/16  Please arrive at [facility] at 5:30am Do not eat or drink after midnight the night prior to the procedure Do not take any medications the morning of the test Plan for one night stay Will need someone to drive you home at discharge    Follow-Up: Your physician recommends that you schedule a follow-up appointment in: 4 weeks from 12/17/16 with [name], NP in [place] and 3 months from 12/17/16 with Dr Rayann Heman   Any Other Special Instructions Will Be Listed Below (If Applicable).     If you need a refill on your cardiac medications before your next appointment, please call your pharmacy.

## 2016-12-03 LAB — BASIC METABOLIC PANEL
BUN / CREAT RATIO: 16 (ref 12–28)
BUN: 17 mg/dL (ref 8–27)
CHLORIDE: 100 mmol/L (ref 96–106)
CO2: 25 mmol/L (ref 20–29)
Calcium: 9.9 mg/dL (ref 8.7–10.3)
Creatinine, Ser: 1.04 mg/dL — ABNORMAL HIGH (ref 0.57–1.00)
GFR calc non Af Amer: 55 mL/min/{1.73_m2} — ABNORMAL LOW (ref >59)
GFR, EST AFRICAN AMERICAN: 63 mL/min/{1.73_m2} (ref >59)
Glucose: 80 mg/dL (ref 65–99)
POTASSIUM: 4.9 mmol/L (ref 3.5–5.2)
Sodium: 139 mmol/L (ref 134–144)

## 2016-12-03 LAB — CBC WITH DIFFERENTIAL/PLATELET
BASOS ABS: 0 10*3/uL (ref 0.0–0.2)
BASOS: 0 %
EOS (ABSOLUTE): 0.1 10*3/uL (ref 0.0–0.4)
Eos: 1 %
HEMOGLOBIN: 11.7 g/dL (ref 11.1–15.9)
Hematocrit: 35.8 % (ref 34.0–46.6)
Immature Grans (Abs): 0 10*3/uL (ref 0.0–0.1)
Immature Granulocytes: 0 %
LYMPHS ABS: 2 10*3/uL (ref 0.7–3.1)
Lymphs: 28 %
MCH: 28.6 pg (ref 26.6–33.0)
MCHC: 32.7 g/dL (ref 31.5–35.7)
MCV: 88 fL (ref 79–97)
MONOCYTES: 6 %
Monocytes Absolute: 0.4 10*3/uL (ref 0.1–0.9)
NEUTROS ABS: 4.7 10*3/uL (ref 1.4–7.0)
Neutrophils: 65 %
Platelets: 297 10*3/uL (ref 150–379)
RBC: 4.09 x10E6/uL (ref 3.77–5.28)
RDW: 14.6 % (ref 12.3–15.4)
WBC: 7.2 10*3/uL (ref 3.4–10.8)

## 2016-12-05 ENCOUNTER — Telehealth: Payer: Self-pay | Admitting: *Deleted

## 2016-12-05 ENCOUNTER — Ambulatory Visit (INDEPENDENT_AMBULATORY_CARE_PROVIDER_SITE_OTHER): Payer: 59 | Admitting: Family Medicine

## 2016-12-05 VITALS — BP 111/68 | HR 63 | Temp 98.0°F | Ht 65.0 in | Wt 181.0 lb

## 2016-12-05 DIAGNOSIS — E669 Obesity, unspecified: Secondary | ICD-10-CM

## 2016-12-05 DIAGNOSIS — Z6830 Body mass index (BMI) 30.0-30.9, adult: Secondary | ICD-10-CM

## 2016-12-05 DIAGNOSIS — Z794 Long term (current) use of insulin: Secondary | ICD-10-CM | POA: Diagnosis not present

## 2016-12-05 DIAGNOSIS — E559 Vitamin D deficiency, unspecified: Secondary | ICD-10-CM

## 2016-12-05 DIAGNOSIS — N183 Chronic kidney disease, stage 3 unspecified: Secondary | ICD-10-CM

## 2016-12-05 DIAGNOSIS — E119 Type 2 diabetes mellitus without complications: Secondary | ICD-10-CM | POA: Diagnosis not present

## 2016-12-05 MED ORDER — VITAMIN D (ERGOCALCIFEROL) 1.25 MG (50000 UNIT) PO CAPS: 50000.0000 [IU] | ORAL_CAPSULE | ORAL | 0 refills | Status: DC

## 2016-12-05 NOTE — Telephone Encounter (Signed)
Patient informed. 

## 2016-12-05 NOTE — Telephone Encounter (Signed)
-----   Message from James Allred, MD sent at 12/04/2016  5:17 PM EDT ----- Results reviewed.  Kelly, please inform pt of result. I will route to primary care also. 

## 2016-12-09 NOTE — Progress Notes (Signed)
Office: [phone]  /  Fax: [phone]   HPI:   Chief Complaint: OBESITY [name] is here to discuss her progress with her obesity treatment plan. She is on the Category 1 plan and is following her eating plan approximately 90 % of the time. She states she is exercising 0 minutes 0 times per week. [name] did well with weight loss on the category 1 plan. She had 1 day of eating out and indulging, but she got back on track and is doing well. [name] liked her lunch options but she is getting bored with dinner. Hunger is controlled but she still has significant cravings. Her weight is 181 lb (82.1 kg) today and has had a weight loss of 4 pounds over a period of 2 weeks since her last visit. She has lost 4 lbs since starting treatment with [name].  Vitamin D deficiency [name] has a diagnosis of vitamin D deficiency. She is currently taking OTC vit D with calcium, level is okay but not optimized. She still notes fatigue and denies nausea, vomiting or muscle weakness.  Diabetes II [name] has a diagnosis of diabetes type II. She is currently on Lantus 12 to 14 units and had some low BGs of 80's and decreased dose to 10 and 2 hour post prandial mostly 98 to 200, over 130's. Last A1c was at 6.4 She has been working on intensive lifestyle modifications including diet, exercise, and weight loss to help control her blood glucose levels.  Chronic Renal Insufficiency Stage 3 [name] notes dark urine but increased urination with lower simple carbohydrate diet. She denies feeling lightheaded.   ALLERGIES: Allergies  Allergen Reactions  . Pravastatin Sodium Other (See Comments)    Leg pain  . Cortisone Hives and Rash    MEDICATIONS: Current Outpatient Prescriptions on File Prior to Visit  Medication Sig Dispense Refill  . atorvastatin (LIPITOR) 10 MG tablet TAKE ONE TABLET BY MOUTH EVERY NIGHT AT BEDTIME 90 tablet 3  . Calcium Carb-Ergocalciferol 500-200 MG-UNIT TABS Take 1 tablet by mouth daily.     [name]  diltiazem (CARDIZEM CD) 180 MG 24 hr capsule TAKE 1 CAPSULE BY MOUTH EVERY DAY (NEED TO SCHEDULE APPT [phone]) 30 capsule 2  . ELIQUIS 5 MG TABS tablet TAKE 1 TABLET BY MOUTH TWICE A DAY 60 tablet 3  . escitalopram (LEXAPRO) 10 MG tablet TAKE ONE TABLET BY MOUTH ONCE DAILY 90 tablet 1  . ferrous sulfate 325 (65 FE) MG tablet Take 325 mg by mouth every other day.    . flecainide (TAMBOCOR) 100 MG tablet TAKE 1 TABLET (100 MG TOTAL) BY MOUTH 2 (TWO) TIMES DAILY. 180 tablet 0  . glucose blood (ONE TOUCH ULTRA TEST) test strip CHECK BLOOD SUGAR 2 TIMES A DAY AS DIRECTED 200 each 3  . Insulin Glargine (LANTUS) 100 UNIT/ML Solostar Pen Inject into the skin as directed.    . Insulin Pen Needle (B-D ULTRAFINE III SHORT PEN) 31G X 8 MM MISC Use as directed Dx:250.00 100 each 6  . linagliptin (TRADJENTA) 5 MG TABS tablet Take 1 tablet (5 mg total) by mouth daily. 30 tablet 11  . metFORMIN (GLUCOPHAGE-XR) 500 MG 24 hr tablet Take 4 tablets (2,000 mg total) by mouth daily with supper. (Patient taking differently: Take 1,000 mg by mouth 2 (two) times daily. ) 360 tablet 3  . Multiple Vitamin (MULTIVITAMIN) tablet Take 1 tablet by mouth daily.      [name] oxybutynin (DITROPAN-XL) 5 MG 24 hr tablet TAKE ONE TABLET BY  MOUTH EVERY NIGHT AT BEDTIME 30 tablet 11  . ranitidine (ZANTAC) 150 MG tablet Take 150 mg by mouth daily.      . sennosides-docusate sodium (SENOKOT-S) 8.6-50 MG tablet Take 1 tablet by mouth daily as needed for constipation.    . traZODone (DESYREL) 50 MG tablet Take 1-2 tablets (50-100 mg total) by mouth at bedtime as needed for sleep. 60 tablet 5   No current facility-administered medications on file prior to visit.     PAST MEDICAL HISTORY: Past Medical History:  Diagnosis Date  . Anemia   . Constipation   . Depressive disorder, not elsewhere classified   . Diabetes mellitus without mention of complication   . Diaphragmatic hernia without mention of obstruction or gangrene   . Disorders  of bursae and tendons in shoulder region, unspecified   . Diverticulosis of colon (without mention of hemorrhage)   . HTN (hypertension)   . Hypothyroidism   . Internal hemorrhoids without mention of complication   . Obesity, unspecified   . Other urinary incontinence   . Persistent atrial fibrillation ([place])   . Pure hypercholesterolemia   . Sleep apnea    compliant with CPAP  . Vitamin B 12 deficiency     PAST SURGICAL HISTORY: Past Surgical History:  Procedure Laterality Date  . ABDOMINAL HYSTERECTOMY  1972   Have both ovaries  . BREAST SURGERY     Reduction  . [place] N/A 05/27/2016   Procedure: CARDIOVERSION;  Surgeon: [name], MD;  Location: [facility] ORS;  Service: Cardiovascular;  Laterality: N/A;  . CARDIOVERSION N/A 11/25/2016   Procedure: CARDIOVERSION;  Surgeon: [name], MD;  Location: [facility] ORS;  Service: Cardiovascular;  Laterality: N/A;  . CATARACT EXTRACTION Right    left scheduled for 07/2016  . CHOLECYSTECTOMY    . ELECTROPHYSIOLOGIC STUDY N/A 01/24/2016   Procedure: CARDIOVERSION;  Surgeon: [name], MD;  Location: [facility] ORS;  Service: Cardiovascular;  Laterality: N/A;  . REDUCTION MAMMAPLASTY Bilateral   . TONSILLECTOMY      SOCIAL HISTORY: Social History  Substance Use Topics  . Smoking status: Never Smoker  . Smokeless tobacco: Never Used  . Alcohol use No    FAMILY HISTORY: Family History  Problem Relation Age of Onset  . Cancer Mother        fullopian tube  . Dementia Mother   . Pulmonary embolism Mother   . Hyperlipidemia Mother   . Diabetes Mother   . Cancer Father        lung  . Hyperlipidemia Father   . Hypertension Sister   . Diabetes Brother   . Stroke Paternal Grandmother   . Stroke Paternal Grandfather     ROS: Review of Systems  Constitutional: Positive for malaise/fatigue and weight loss.  Gastrointestinal: Negative for nausea and vomiting.  Musculoskeletal:        Negative muscle weakness  Neurological:       Negative lightheadedness    PHYSICAL EXAM: Blood pressure 111/68, pulse 63, temperature 98 F (36.7 C), temperature source Oral, height 5\' 5"  (1.651 m), weight 181 lb (82.1 kg), SpO2 98 %. Body mass index is 30.12 kg/m. Physical Exam  Constitutional: She is oriented to person, place, and time. She appears well-developed and well-nourished.  Cardiovascular: Normal rate.   Pulmonary/Chest: Effort normal.  Musculoskeletal: Normal range of motion.  Neurological: She is oriented to person, place, and time.  Skin: Skin is warm and dry.  Psychiatric: She has a normal mood and affect. Her behavior is normal.  Vitals reviewed.   RECENT LABS AND TESTS: BMET    Component Value Date/Time   NA 139 12/02/2016 1233   K 4.9 12/02/2016 1233   CL 100 12/02/2016 1233   CO2 25 12/02/2016 1233   GLUCOSE 80 12/02/2016 1233   GLUCOSE 141 (H) 11/18/2016 1030   BUN 17 12/02/2016 1233   CREATININE 1.04 (H) 12/02/2016 1233   CREATININE 0.94 09/24/2013 1610   CALCIUM 9.9 12/02/2016 1233   GFRNONAA 55 (L) 12/02/2016 1233   GFRAA 63 12/02/2016 1233   Lab Results  Component Value Date   HGBA1C 6.4 (H) 11/20/2016   HGBA1C 6.9 09/16/2016   HGBA1C 6.9 05/23/2016   HGBA1C 6.8 02/23/2016   HGBA1C 6.8 11/24/2015   Lab Results  Component Value Date   INSULIN 6.0 11/20/2016   CBC    Component Value Date/Time   WBC 7.2 12/02/2016 1233   WBC 5.6 11/18/2016 1030   RBC 4.09 12/02/2016 1233   RBC 3.97 11/18/2016 1030   HGB 11.7 12/02/2016 1233   HCT 35.8 12/02/2016 1233   PLT 297 12/02/2016 1233   MCV 88 12/02/2016 1233   MCH 28.6 12/02/2016 1233   MCH 28.5 11/18/2016 1030   MCHC 32.7 12/02/2016 1233   MCHC 31.9 11/18/2016 1030   RDW 14.6 12/02/2016 1233   LYMPHSABS 2.0 12/02/2016 1233   MONOABS 0.5 05/24/2016 1024   EOSABS 0.1 12/02/2016 1233   BASOSABS 0.0 12/02/2016 1233   Iron/TIBC/Ferritin/ %Sat    Component Value Date/Time   IRON 57  05/24/2016 1024   FERRITIN 5.3 (L) 05/24/2016 1024   IRONPCTSAT 12.1 (L) 05/24/2016 1024   Lipid Panel     Component Value Date/Time   CHOL 147 11/20/2016 0919   TRIG 57 11/20/2016 0919   HDL 47 11/20/2016 0919   CHOLHDL 3 05/10/2016 1313   VLDL 15.2 05/10/2016 1313   LDLCALC 89 11/20/2016 0919   LDLDIRECT 163.1 07/04/2011 0922   Hepatic Function Panel     Component Value Date/Time   PROT 6.9 11/20/2016 0919   ALBUMIN 4.3 11/20/2016 0919   AST 16 11/20/2016 0919   ALT 13 11/20/2016 0919   ALKPHOS 64 11/20/2016 0919   BILITOT 0.3 11/20/2016 0919   BILIDIR 0.0 07/10/2010 0822   IBILI 0.3 04/02/2010 2210      Component Value Date/Time   TSH 2.719 11/18/2016 1030   TSH 3.402 01/22/2016 1450   TSH 2.885 09/24/2013 1610    ASSESSMENT AND PLAN: Diabetes mellitus, type II, insulin dependent (HCC)  Vitamin D deficiency - Plan: Vitamin D, Ergocalciferol, (DRISDOL) 50000 units CAPS capsule  CRI (chronic renal insufficiency), stage 3 (moderate) (HCC)  Class 1 obesity with serious comorbidity and body mass index (BMI) of 30.0 to 30.9 in adult, unspecified obesity type  PLAN:  Vitamin D Deficiency Shigeko was informed that low vitamin D levels contributes to fatigue and are associated with obesity, breast, and colon cancer. She agrees to start to take prescription Vit D @50 ,000 IU every week #4 with no refills and we will recheck labs in 2 months and will follow up for routine testing of vitamin D, at least 2-3 times per year. She was informed of the risk of over-replacement of vitamin D and agrees to not increase her dose unless he discusses this with [name] first. Njeri agrees to follow up with our clinic in 2 weeks.  Diabetes II Lurine has been given extensive diabetes  education by myself today including ideal fasting and post-prandial blood glucose readings, individual ideal Hgb A1c goals  and hypoglycemia prevention. We discussed the importance of good blood sugar control to decrease  the likelihood of diabetic complications such as nephropathy, neuropathy, limb loss, blindness, coronary artery disease, and death. We discussed the importance of intensive lifestyle modification including diet, exercise and weight loss as the first line treatment for diabetes. Yaeli agrees to continue her diabetes medications as prescribed but decrease Lantus when blood sugar drops to below 90 and bring in log to next visit. She will continue with diet and exercise and will follow up at the agreed upon time.  Chronic Renal Insufficiency Stage 3 Leahmarie agrees to increase her H2O intake and continue with diet, exercise and weight loss efforts. We will recheck labs in 2 months and Kambrea agrees to follow up with our clinic in 2 weeks.  Obesity Shaunika is currently in the action stage of change. As such, her goal is to continue with weight loss efforts She has agreed to follow the Category 1 plan and Pescatarian dinner options Raylie has been instructed to work up to a goal of 150 minutes of combined cardio and strengthening exercise per week for weight loss and overall health benefits. We discussed the following Behavioral Modification Strategies today: work on meal planning and easy cooking plans , increase H2O intake and travel eating strategies.  Jocilynn has agreed to follow up with our clinic in 2 weeks. She was informed of the importance of frequent follow up visits to maximize her success with intensive lifestyle modifications for her multiple health conditions.  I, Doreene Nest, am acting as transcriptionist for Dennard Nip, MD  I have reviewed the above documentation for accuracy and completeness, and I agree with the above. -Dennard Nip, MD   OBESITY BEHAVIORAL INTERVENTION VISIT  Today's visit was # 2 out of 43.  Starting weight: 185 lbs Starting date: 11/20/16 Today's weight : 181 lbs Today's date: 12/05/2016 Total lbs lost to date: 4 (Patients must lose 7 lbs in the first 6 months  to continue with counseling)   ASK: We discussed the diagnosis of obesity with [name] today and Larkyn agreed to give [name] permission to discuss obesity behavioral modification therapy today.  ASSESS: Hanalei has the diagnosis of obesity and her BMI today is 30.12 Sofi is in the action stage of change   ADVISE: Jesilyn was educated on the multiple health risks of obesity as well as the benefit of weight loss to improve her health. She was advised of the need for long term treatment and the importance of lifestyle modifications.  AGREE: Multiple dietary modification options and treatment options were discussed and  Shawntee agreed to follow the Category 1 plan and Pescatarian dinner options We discussed the following Behavioral Modification Strategies today: work on meal planning and easy cooking plans, increase H2O intake and travel eating strategies

## 2016-12-12 ENCOUNTER — Ambulatory Visit (HOSPITAL_COMMUNITY)
Admission: RE | Admit: 2016-12-12 | Discharge: 2016-12-12 | Disposition: A | Discharge status: Home / Self Care | Payer: 59 | Source: Ambulatory Visit | Attending: Internal Medicine | Admitting: Internal Medicine

## 2016-12-12 ENCOUNTER — Encounter (HOSPITAL_COMMUNITY): Payer: Self-pay

## 2016-12-12 DIAGNOSIS — I481 Persistent atrial fibrillation: Secondary | ICD-10-CM | POA: Insufficient documentation

## 2016-12-12 DIAGNOSIS — I4891 Unspecified atrial fibrillation: Secondary | ICD-10-CM | POA: Diagnosis not present

## 2016-12-12 DIAGNOSIS — I4819 Other persistent atrial fibrillation: Secondary | ICD-10-CM

## 2016-12-12 MED ORDER — IOPAMIDOL (ISOVUE-370) INJECTION 76%
80.0000 mL | Freq: Once | INTRAVENOUS | Status: AC | PRN
  Administered 2016-12-12: 80 mL via INTRAVENOUS

## 2016-12-12 MED ORDER — NITROGLYCERIN 0.4 MG SL SUBL
SUBLINGUAL_TABLET | SUBLINGUAL | Status: AC
  Filled 2016-12-12: qty 2

## 2016-12-12 MED ORDER — NITROGLYCERIN 0.4 MG SL SUBL
0.8000 mg | SUBLINGUAL_TABLET | Freq: Once | SUBLINGUAL | Status: AC
  Administered 2016-12-12: 0.8 mg via SUBLINGUAL

## 2016-12-16 NOTE — Anesthesia Preprocedure Evaluation (Addendum)
Anesthesia Evaluation  Patient identified by MRN, date of birth, ID band Patient awake    Reviewed: Allergy & Precautions, H&P , NPO status , Patient's Chart, lab work & pertinent test results  Airway Mallampati: III  TM Distance: >3 FB Neck ROM: Full    Dental no notable dental hx. (+) Teeth Intact, Dental Advisory Given   Pulmonary sleep apnea and Continuous Positive Airway Pressure Ventilation ,    Pulmonary exam normal breath sounds clear to auscultation       Cardiovascular Exercise Tolerance: Good hypertension, Pt. on medications  Rhythm:Regular Rate:Normal     Neuro/Psych Depression negative neurological ROS  negative psych ROS   GI/Hepatic negative GI ROS, Neg liver ROS,   Endo/Other  diabetes, Oral Hypoglycemic Agents, Insulin DependentHypothyroidism   Renal/GU negative Renal ROS  negative genitourinary   Musculoskeletal   Abdominal   Peds  Hematology negative hematology ROS (+) anemia ,   Anesthesia Other Findings   Reproductive/Obstetrics negative OB ROS                            Anesthesia Physical Anesthesia Plan  ASA: III  Anesthesia Plan: General   Post-op Pain Management:    Induction: Intravenous  PONV Risk Score and Plan: 4 or greater and Ondansetron, Dexamethasone and Midazolam  Airway Management Planned: LMA  Additional Equipment:   Intra-op Plan:   Post-operative Plan: Extubation in OR  Informed Consent: I have reviewed the patients History and Physical, chart, labs and discussed the procedure including the risks, benefits and alternatives for the proposed anesthesia with the patient or authorized representative who has indicated his/her understanding and acceptance.   Dental advisory given  Plan Discussed with: CRNA  Anesthesia Plan Comments:        Anesthesia Quick Evaluation

## 2016-12-17 ENCOUNTER — Ambulatory Visit (HOSPITAL_COMMUNITY): Payer: 59 | Admitting: Anesthesiology

## 2016-12-17 ENCOUNTER — Encounter (HOSPITAL_COMMUNITY): Payer: Self-pay | Admitting: General Practice

## 2016-12-17 ENCOUNTER — Encounter (HOSPITAL_COMMUNITY)
Admission: RE | Disposition: A | Discharge status: Home / Self Care | Payer: Self-pay | Source: Ambulatory Visit | Attending: Internal Medicine

## 2016-12-17 ENCOUNTER — Ambulatory Visit (HOSPITAL_COMMUNITY)
Admission: RE | Admit: 2016-12-17 | Discharge: 2016-12-18 | Disposition: A | Discharge status: Home / Self Care | Payer: 59 | Source: Ambulatory Visit | Attending: Internal Medicine | Admitting: Internal Medicine

## 2016-12-17 DIAGNOSIS — G4733 Obstructive sleep apnea (adult) (pediatric): Secondary | ICD-10-CM | POA: Insufficient documentation

## 2016-12-17 DIAGNOSIS — E039 Hypothyroidism, unspecified: Secondary | ICD-10-CM | POA: Insufficient documentation

## 2016-12-17 DIAGNOSIS — I484 Atypical atrial flutter: Secondary | ICD-10-CM | POA: Insufficient documentation

## 2016-12-17 DIAGNOSIS — I481 Persistent atrial fibrillation: Secondary | ICD-10-CM | POA: Diagnosis not present

## 2016-12-17 DIAGNOSIS — D649 Anemia, unspecified: Secondary | ICD-10-CM | POA: Diagnosis not present

## 2016-12-17 DIAGNOSIS — I10 Essential (primary) hypertension: Secondary | ICD-10-CM | POA: Insufficient documentation

## 2016-12-17 DIAGNOSIS — F329 Major depressive disorder, single episode, unspecified: Secondary | ICD-10-CM | POA: Insufficient documentation

## 2016-12-17 DIAGNOSIS — I4510 Unspecified right bundle-branch block: Secondary | ICD-10-CM | POA: Diagnosis not present

## 2016-12-17 DIAGNOSIS — Z7901 Long term (current) use of anticoagulants: Secondary | ICD-10-CM | POA: Insufficient documentation

## 2016-12-17 DIAGNOSIS — Z6830 Body mass index (BMI) 30.0-30.9, adult: Secondary | ICD-10-CM | POA: Insufficient documentation

## 2016-12-17 DIAGNOSIS — E7800 Pure hypercholesterolemia, unspecified: Secondary | ICD-10-CM | POA: Diagnosis not present

## 2016-12-17 DIAGNOSIS — I4892 Unspecified atrial flutter: Secondary | ICD-10-CM

## 2016-12-17 DIAGNOSIS — I4819 Other persistent atrial fibrillation: Secondary | ICD-10-CM | POA: Diagnosis present

## 2016-12-17 DIAGNOSIS — I4891 Unspecified atrial fibrillation: Secondary | ICD-10-CM | POA: Diagnosis not present

## 2016-12-17 DIAGNOSIS — Z794 Long term (current) use of insulin: Secondary | ICD-10-CM | POA: Insufficient documentation

## 2016-12-17 DIAGNOSIS — E538 Deficiency of other specified B group vitamins: Secondary | ICD-10-CM | POA: Diagnosis not present

## 2016-12-17 DIAGNOSIS — E669 Obesity, unspecified: Secondary | ICD-10-CM | POA: Diagnosis not present

## 2016-12-17 DIAGNOSIS — E119 Type 2 diabetes mellitus without complications: Secondary | ICD-10-CM | POA: Insufficient documentation

## 2016-12-17 HISTORY — PX: ATRIAL FIBRILLATION ABLATION: EP1191

## 2016-12-17 HISTORY — DX: Anxiety disorder, unspecified: F41.9

## 2016-12-17 HISTORY — DX: Gastro-esophageal reflux disease without esophagitis: K21.9

## 2016-12-17 HISTORY — DX: Type 2 diabetes mellitus without complications: E11.9

## 2016-12-17 HISTORY — DX: Urgency of urination: R39.15

## 2016-12-17 HISTORY — DX: Obstructive sleep apnea (adult) (pediatric): G47.33

## 2016-12-17 HISTORY — DX: Frequency of micturition: R35.0

## 2016-12-17 HISTORY — DX: Personal history of other diseases of the digestive system: Z87.19

## 2016-12-17 HISTORY — DX: Dependence on other enabling machines and devices: Z99.89

## 2016-12-17 LAB — POCT ACTIVATED CLOTTING TIME
ACTIVATED CLOTTING TIME: 219 s
ACTIVATED CLOTTING TIME: 208 s
ACTIVATED CLOTTING TIME: 197 s
Activated Clotting Time: 246 seconds
Activated Clotting Time: 274 seconds
Activated Clotting Time: 279 seconds
Activated Clotting Time: 362 seconds

## 2016-12-17 LAB — CBC
HCT: 36.5 % (ref 36.0–46.0)
HEMOGLOBIN: 11.8 g/dL — AB (ref 12.0–15.0)
MCH: 28.6 pg (ref 26.0–34.0)
MCHC: 32.3 g/dL (ref 30.0–36.0)
MCV: 88.6 fL (ref 78.0–100.0)
PLATELETS: 273 10*3/uL (ref 150–400)
RBC: 4.12 MIL/uL (ref 3.87–5.11)
RDW: 14 % (ref 11.5–15.5)
WBC: 5.1 10*3/uL (ref 4.0–10.5)

## 2016-12-17 LAB — BASIC METABOLIC PANEL
ANION GAP: 7 (ref 5–15)
BUN: 15 mg/dL (ref 6–20)
CALCIUM: 9.4 mg/dL (ref 8.9–10.3)
CO2: 26 mmol/L (ref 22–32)
Chloride: 106 mmol/L (ref 101–111)
Creatinine, Ser: 0.94 mg/dL (ref 0.44–1.00)
Glucose, Bld: 139 mg/dL — ABNORMAL HIGH (ref 65–99)
POTASSIUM: 4.1 mmol/L (ref 3.5–5.1)
SODIUM: 139 mmol/L (ref 135–145)

## 2016-12-17 LAB — GLUCOSE, CAPILLARY
GLUCOSE-CAPILLARY: 151 mg/dL — AB (ref 65–99)
GLUCOSE-CAPILLARY: 235 mg/dL — AB (ref 65–99)
GLUCOSE-CAPILLARY: 198 mg/dL — AB (ref 65–99)
Glucose-Capillary: 153 mg/dL — ABNORMAL HIGH (ref 65–99)

## 2016-12-17 SURGERY — ATRIAL FIBRILLATION ABLATION
Anesthesia: General

## 2016-12-17 MED ORDER — HEPARIN SODIUM (PORCINE) 1000 UNIT/ML IJ SOLN
INTRAMUSCULAR | Status: DC | PRN
  Administered 2016-12-17: 3000 [IU] via INTRAVENOUS
  Administered 2016-12-17: 4000 [IU] via INTRAVENOUS
  Administered 2016-12-17: 5000 [IU] via INTRAVENOUS

## 2016-12-17 MED ORDER — INSULIN GLARGINE 100 UNIT/ML ~~LOC~~ SOLN
10.0000 [IU] | Freq: Every day | SUBCUTANEOUS | Status: DC
  Administered 2016-12-17: 10 [IU] via SUBCUTANEOUS
  Filled 2016-12-17 (×2): qty 0.1

## 2016-12-17 MED ORDER — SODIUM CHLORIDE 0.9% FLUSH
3.0000 mL | Freq: Two times a day (BID) | INTRAVENOUS | Status: DC
  Administered 2016-12-17: 17:00:00 3 mL via INTRAVENOUS

## 2016-12-17 MED ORDER — ISOPROTERENOL HCL 0.2 MG/ML IJ SOLN
INTRAVENOUS | Status: DC | PRN
  Administered 2016-12-17: 10 ug/min via INTRAVENOUS

## 2016-12-17 MED ORDER — SODIUM CHLORIDE 0.9% FLUSH: 3.0000 mL | INTRAVENOUS | Status: DC | PRN

## 2016-12-17 MED ORDER — OXYBUTYNIN CHLORIDE ER 5 MG PO TB24
5.0000 mg | ORAL_TABLET | Freq: Every day | ORAL | Status: DC
  Administered 2016-12-17: 21:00:00 5 mg via ORAL
  Filled 2016-12-17: qty 1

## 2016-12-17 MED ORDER — BUPIVACAINE HCL (PF) 0.25 % IJ SOLN
INTRAMUSCULAR | Status: DC | PRN
  Administered 2016-12-17: 20 mL

## 2016-12-17 MED ORDER — SODIUM CHLORIDE 0.9 % IV SOLN
INTRAVENOUS | Status: DC
  Administered 2016-12-17 (×2): via INTRAVENOUS

## 2016-12-17 MED ORDER — LIDOCAINE HCL (CARDIAC) 20 MG/ML IV SOLN
INTRAVENOUS | Status: DC | PRN
  Administered 2016-12-17: 60 mg via INTRAVENOUS

## 2016-12-17 MED ORDER — LIDOCAINE HCL 2 % IJ SOLN
0.1000 mL | Freq: Once | INTRAMUSCULAR | Status: AC
  Administered 2016-12-17: 2 mg via INTRADERMAL

## 2016-12-17 MED ORDER — SODIUM CHLORIDE 0.9 % IV SOLN: 250.0000 mL | INTRAVENOUS | Status: DC | PRN

## 2016-12-17 MED ORDER — PHENOL 1.4 % MT LIQD
1.0000 | OROMUCOSAL | Status: DC | PRN
  Administered 2016-12-17: 1 via OROMUCOSAL
  Filled 2016-12-17: qty 177

## 2016-12-17 MED ORDER — PROTAMINE SULFATE 10 MG/ML IV SOLN
INTRAVENOUS | Status: DC | PRN
  Administered 2016-12-17 (×3): 10 mg via INTRAVENOUS

## 2016-12-17 MED ORDER — ATORVASTATIN CALCIUM 10 MG PO TABS
10.0000 mg | ORAL_TABLET | Freq: Every day | ORAL | Status: DC
  Administered 2016-12-17: 10 mg via ORAL
  Filled 2016-12-17 (×2): qty 1

## 2016-12-17 MED ORDER — HEPARIN SODIUM (PORCINE) 1000 UNIT/ML IJ SOLN
INTRAMUSCULAR | Status: AC
  Filled 2016-12-17: qty 1

## 2016-12-17 MED ORDER — HEPARIN SODIUM (PORCINE) 1000 UNIT/ML IJ SOLN
INTRAMUSCULAR | Status: DC | PRN
  Administered 2016-12-17 (×2): 1000 [IU] via INTRAVENOUS

## 2016-12-17 MED ORDER — PHENYLEPHRINE HCL 10 MG/ML IJ SOLN
INTRAMUSCULAR | Status: DC | PRN
  Administered 2016-12-17 (×3): 40 ug via INTRAVENOUS
  Administered 2016-12-17: 120 ug via INTRAVENOUS
  Administered 2016-12-17 (×2): 80 ug via INTRAVENOUS

## 2016-12-17 MED ORDER — HEPARIN (PORCINE) IN NACL 2-0.9 UNIT/ML-% IJ SOLN
INTRAMUSCULAR | Status: AC | PRN
  Administered 2016-12-17: 500 mL

## 2016-12-17 MED ORDER — TRAZODONE HCL 50 MG PO TABS: 50.0000 mg | ORAL_TABLET | Freq: Every evening | ORAL | Status: DC | PRN

## 2016-12-17 MED ORDER — ACETAMINOPHEN 325 MG PO TABS: 650.0000 mg | ORAL_TABLET | ORAL | Status: DC | PRN

## 2016-12-17 MED ORDER — INSULIN ASPART 100 UNIT/ML ~~LOC~~ SOLN
0.0000 [IU] | Freq: Three times a day (TID) | SUBCUTANEOUS | Status: DC
  Administered 2016-12-17: 5 [IU] via SUBCUTANEOUS
  Administered 2016-12-18: 07:00:00 2 [IU] via SUBCUTANEOUS

## 2016-12-17 MED ORDER — APIXABAN 5 MG PO TABS
5.0000 mg | ORAL_TABLET | Freq: Two times a day (BID) | ORAL | Status: DC
  Administered 2016-12-17 – 2016-12-18 (×2): 5 mg via ORAL
  Filled 2016-12-17: qty 1

## 2016-12-17 MED ORDER — BUPIVACAINE HCL (PF) 0.25 % IJ SOLN
INTRAMUSCULAR | Status: AC
  Filled 2016-12-17: qty 30

## 2016-12-17 MED ORDER — ONDANSETRON HCL 4 MG/2ML IJ SOLN: 4.0000 mg | Freq: Four times a day (QID) | INTRAMUSCULAR | Status: DC | PRN

## 2016-12-17 MED ORDER — ESCITALOPRAM OXALATE 10 MG PO TABS
10.0000 mg | ORAL_TABLET | Freq: Every day | ORAL | Status: DC
  Administered 2016-12-17 – 2016-12-18 (×2): 10 mg via ORAL
  Filled 2016-12-17 (×2): qty 1

## 2016-12-17 MED ORDER — OFF THE BEAT BOOK
Freq: Once | Status: AC
  Administered 2016-12-17: 20:00:00
  Filled 2016-12-17: qty 1

## 2016-12-17 MED ORDER — METFORMIN HCL ER 500 MG PO TB24: 1000.0000 mg | ORAL_TABLET | Freq: Two times a day (BID) | ORAL | Status: DC

## 2016-12-17 MED ORDER — LINAGLIPTIN 5 MG PO TABS
5.0000 mg | ORAL_TABLET | Freq: Every day | ORAL | Status: DC
  Administered 2016-12-17 – 2016-12-18 (×2): 5 mg via ORAL
  Filled 2016-12-17 (×2): qty 1

## 2016-12-17 MED ORDER — FENTANYL CITRATE (PF) 100 MCG/2ML IJ SOLN
INTRAMUSCULAR | Status: DC | PRN
  Administered 2016-12-17: 50 ug via INTRAVENOUS

## 2016-12-17 MED ORDER — APIXABAN 5 MG PO TABS
5.0000 mg | ORAL_TABLET | Freq: Two times a day (BID) | ORAL | Status: DC
  Filled 2016-12-17: qty 1

## 2016-12-17 MED ORDER — PROPOFOL 10 MG/ML IV BOLUS
INTRAVENOUS | Status: DC | PRN
  Administered 2016-12-17: 130 mg via INTRAVENOUS

## 2016-12-17 MED ORDER — EPHEDRINE SULFATE 50 MG/ML IJ SOLN
INTRAMUSCULAR | Status: DC | PRN
  Administered 2016-12-17 (×4): 10 mg via INTRAVENOUS

## 2016-12-17 MED ORDER — HYDROCODONE-ACETAMINOPHEN 5-325 MG PO TABS: 1.0000 | ORAL_TABLET | ORAL | Status: DC | PRN

## 2016-12-17 MED ORDER — MIDAZOLAM HCL 5 MG/5ML IJ SOLN
INTRAMUSCULAR | Status: DC | PRN
  Administered 2016-12-17 (×2): 1 mg via INTRAVENOUS

## 2016-12-17 MED ORDER — ISOPROTERENOL HCL 0.2 MG/ML IJ SOLN
INTRAMUSCULAR | Status: AC
  Filled 2016-12-17: qty 5

## 2016-12-17 MED ORDER — ONDANSETRON HCL 4 MG/2ML IJ SOLN
INTRAMUSCULAR | Status: DC | PRN
  Administered 2016-12-17: 4 mg via INTRAVENOUS

## 2016-12-17 SURGICAL SUPPLY — 17 items
BLANKET WARM UNDERBOD FULL ACC (MISCELLANEOUS) ×2 IMPLANT
CATH MAPPNG PENTARAY F 2-6-2MM (CATHETERS) ×1 IMPLANT
CATH NAVISTAR SMARTTOUCH DF (ABLATOR) ×2 IMPLANT
CATH SOUNDSTAR 3D IMAGING (CATHETERS) ×2 IMPLANT
CATH WEBSTER BI DIR CS D-F CRV (CATHETERS) ×2 IMPLANT
COVER SWIFTLINK CONNECTOR (BAG) ×2 IMPLANT
NEEDLE TRANSEP BRK 71CM 407200 (NEEDLE) ×2 IMPLANT
PACK EP LATEX FREE (CUSTOM PROCEDURE TRAY) ×1
PACK EP LF (CUSTOM PROCEDURE TRAY) ×1 IMPLANT
PAD DEFIB LIFELINK (PAD) ×2 IMPLANT
PATCH CARTO3 (PAD) ×2 IMPLANT
PENTARAY F 2-6-2MM (CATHETERS) ×2
SHEATH AVANTI 11F 11CM (SHEATH) ×2 IMPLANT
SHEATH PINNACLE 7F 10CM (SHEATH) ×4 IMPLANT
SHEATH PINNACLE 9F 10CM (SHEATH) ×2 IMPLANT
SHEATH SWARTZ TS SL2 63CM 8.5F (SHEATH) ×2 IMPLANT
TUBING SMART ABLATE COOLFLOW (TUBING) ×2 IMPLANT

## 2016-12-17 NOTE — Progress Notes (Signed)
Site area: rt groin fv sheaths x3 Site Prior to Removal:  Level 0 Pressure Applied For: 20 minutes Manual:  yes  Patient Status During Pull:  stable Post Pull Site:  Level  0 Post Pull Instructions Given:  yes Post Pull Pulses Present: palpable Dressing Applied:  Gauze and tegaderm Bedrest begins @  1300 Comments:  IV saline locked

## 2016-12-17 NOTE — Transfer of Care (Signed)
Immediate Anesthesia Transfer of Care Note  Patient: [name]  Procedure(s) Performed: ATRIAL FIBRILLATION ABLATION (N/A )  Patient Location: Cath Lab  Anesthesia Type:General  Level of Consciousness: awake and alert   Airway & Oxygen Therapy: Patient Spontanous Breathing and Patient connected to nasal cannula oxygen  Post-op Assessment: Report given to RN and Post -op Vital signs reviewed and stable  Post vital signs: Reviewed and stable  Last Vitals:  Vitals:   12/17/16 0533 12/17/16 1021  BP: 126/68   Pulse: (!) 56   Resp: 18   Temp: 36.7 C (!) 36.1 C  SpO2: 95%     Last Pain:  Vitals:   12/17/16 0533  TempSrc: Oral         Complications: No apparent anesthesia complications

## 2016-12-17 NOTE — Anesthesia Procedure Notes (Signed)
Procedure Name: LMA Insertion Date/Time: 12/17/2016 7:36 AM Performed by: Valda Favia Pre-anesthesia Checklist: Patient identified, Emergency Drugs available, Suction available, Patient being monitored and Timeout performed Patient Re-evaluated:Patient Re-evaluated prior to induction Oxygen Delivery Method: Circle system utilized Preoxygenation: Pre-oxygenation with 100% oxygen Induction Type: IV induction LMA: LMA inserted LMA Size: 4.0 Number of attempts: 1 Placement Confirmation: positive ETCO2 and breath sounds checked- equal and bilateral Tube secured with: Tape Dental Injury: Teeth and Oropharynx as per pre-operative assessment

## 2016-12-17 NOTE — Discharge Instructions (Signed)
No driving for 4 days. No lifting over 5 lbs for 1 week. No sexual activity for 1 week. You may return to work in 1 week. Keep procedure site clean & dry. If you notice increased pain, swelling, bleeding or pus, call/return!  You may shower, but no soaking baths/hot tubs/pools for 1 week.  ° ° °You have an appointment set up with the At[facility] Fibrillation Clinic.  Multiple studies have shown that being followed by a dedicated atrial fibrillation clinic in addition to the standard care you receive from your other physicians improves health. We believe that enrollment in the atrial fibrillation clinic will allow us to better care for you.  ° °The phone number to the Atrial Fibrillation Clinic is [phone]. The clinic is staffed Monday through Friday from 8:30am to 5pm. ° °Parking Directions: The clinic is located in the [facility] and Vascular Building connected to [place] [facility]. °1)From [address] turn on to [address] and go to the 3rd entrance  (Heart and Vascular entrance) on the right. °2)Look to the right for Heart &Vascular Parking Garage. °3)A code for the entrance is required please call the clinic to receive this.   °4)Take the elevators to the 1st floor. Registration is in the room with the glass walls at the end of the hallway. ° °If you have any trouble parking or locating the clinic, please don’t hesitate to call [phone]. ° ° °

## 2016-12-17 NOTE — Interval H&P Note (Signed)
History and Physical Interval Note:  12/17/2016 7:23 AM  [name]  has presented today for surgery, with the diagnosis of afib  The various methods of treatment have been discussed with the patient and family. After consideration of risks, benefits and other options for treatment, the patient has consented to  Procedure(s): ATRIAL FIBRILLATION ABLATION (N/A) as a surgical intervention .  The patient's history has been reviewed, patient examined, no change in status, stable for surgery.  I have reviewed the patient's chart and labs.  Questions were answered to the patient's satisfaction.     Thompson Grayer

## 2016-12-17 NOTE — H&P (View-Only) (Signed)
Electrophysiology Office Note   Date:  12/02/2016   ID:  [name], DOB 07-25-1945, MRN [id number]  PCP:  Jinny Sanders, MD  Cardiologist:  Dr Fletcher Anon Primary Electrophysiologist: Thompson Grayer, MD    Chief Complaint  Patient presents with  . Atrial Fibrillation     History of Present Illness: [name] is a 71 y.o. female who presents today for electrophysiology evaluation.   The patient is referred by Dr Fletcher Anon and [name] NP in the AF clinic for afib management consultation.  She reports initially having atrial fibrillation 06/2015 after developing tachypalpitations with dizziness.  She was evaluated and hospitalized.  She required cardioversion.  She was placed on diltiazem and subsequently flecainide.  She has recurrent afib November 2017 and also August 2018 for which she was again symptomatic and required cardioversion.  She is unaware of triggers/ precipitants.  She does not drink ETOH.  She has OSA but uses CPAP.  Today, she denies symptoms of palpitations, chest pain, shortness of breath, orthopnea, PND, lower extremity edema, claudication, dizziness, presyncope, syncope, bleeding, or neurologic sequela. The patient is tolerating medications without difficulties and is otherwise without complaint today.    Past Medical History:  Diagnosis Date  . Anemia   . Constipation   . Depressive disorder, not elsewhere classified   . Diabetes mellitus without mention of complication   . Diaphragmatic hernia without mention of obstruction or gangrene   . Disorders of bursae and tendons in shoulder region, unspecified   . Diverticulosis of colon (without mention of hemorrhage)   . HTN (hypertension)   . Hypothyroidism   . Internal hemorrhoids without mention of complication   . Obesity, unspecified   . Other urinary incontinence   . Persistent atrial fibrillation ([place])   . Pure hypercholesterolemia   . Sleep apnea    compliant with CPAP  . Vitamin B 12 deficiency      Past Surgical History:  Procedure Laterality Date  . ABDOMINAL HYSTERECTOMY  1972   Have both ovaries  . BREAST SURGERY     Reduction  . [place] N/A 05/27/2016   Procedure: CARDIOVERSION;  Surgeon: [name], MD;  Location: [facility] ORS;  Service: Cardiovascular;  Laterality: N/A;  . CARDIOVERSION N/A 11/25/2016   Procedure: CARDIOVERSION;  Surgeon: [name], MD;  Location: [facility] ORS;  Service: Cardiovascular;  Laterality: N/A;  . CATARACT EXTRACTION Right    left scheduled for 07/2016  . CHOLECYSTECTOMY    . ELECTROPHYSIOLOGIC STUDY N/A 01/24/2016   Procedure: CARDIOVERSION;  Surgeon: [name], MD;  Location: [facility] ORS;  Service: Cardiovascular;  Laterality: N/A;  . REDUCTION MAMMAPLASTY Bilateral   . TONSILLECTOMY       Current Outpatient Prescriptions  Medication Sig Dispense Refill  . atorvastatin (LIPITOR) 10 MG tablet TAKE ONE TABLET BY MOUTH EVERY NIGHT AT BEDTIME 90 tablet 3  . Calcium Carb-Ergocalciferol 500-200 MG-UNIT TABS Take 1 tablet by mouth daily.     [name] diltiazem (CARDIZEM CD) 180 MG 24 hr capsule TAKE 1 CAPSULE BY MOUTH EVERY DAY (NEED TO SCHEDULE APPT [phone]) 30 capsule 2  . ELIQUIS 5 MG TABS tablet TAKE 1 TABLET BY MOUTH TWICE A DAY 60 tablet 3  . escitalopram (LEXAPRO) 10 MG tablet TAKE ONE TABLET BY MOUTH ONCE DAILY 90 tablet 1  . ferrous sulfate 325 (65 FE) MG tablet Take 325 mg by mouth every other day.    [name]  flecainide (TAMBOCOR) 100 MG tablet TAKE 1 TABLET (100 MG TOTAL) BY MOUTH 2 (TWO) TIMES DAILY. 180 tablet 0  . glucose blood (ONE TOUCH ULTRA TEST) test strip CHECK BLOOD SUGAR 2 TIMES A DAY AS DIRECTED 200 each 3  . Insulin Glargine (LANTUS) 100 UNIT/ML Solostar Pen Inject into the skin as directed.    . Insulin Pen Needle (B-D ULTRAFINE III SHORT PEN) 31G X 8 MM MISC Use as directed Dx:250.00 100 each 6  . linagliptin (TRADJENTA) 5 MG TABS tablet Take 1 tablet (5 mg total) by mouth daily.  30 tablet 11  . metFORMIN (GLUCOPHAGE-XR) 500 MG 24 hr tablet Take 4 tablets (2,000 mg total) by mouth daily with supper. (Patient taking differently: Take 1,000 mg by mouth 2 (two) times daily. ) 360 tablet 3  . Multiple Vitamin (MULTIVITAMIN) tablet Take 1 tablet by mouth daily.      [name] oxybutynin (DITROPAN-XL) 5 MG 24 hr tablet TAKE ONE TABLET BY MOUTH EVERY NIGHT AT BEDTIME 30 tablet 11  . ranitidine (ZANTAC) 150 MG tablet Take 150 mg by mouth daily.      . sennosides-docusate sodium (SENOKOT-S) 8.6-50 MG tablet Take 1 tablet by mouth daily as needed for constipation.    . traZODone (DESYREL) 50 MG tablet Take 1-2 tablets (50-100 mg total) by mouth at bedtime as needed for sleep. 60 tablet 5   No current facility-administered medications for this visit.     Allergies:   Pravastatin sodium and Cortisone   Social History:  The patient  reports that she has never smoked. She has never used smokeless tobacco. She reports that she does not drink alcohol or use drugs.   Family History:  The patient's  family history includes Cancer in her father and mother; Dementia in her mother; Diabetes in her brother and mother; Hyperlipidemia in her father and mother; Hypertension in her sister; Pulmonary embolism in her mother; Stroke in her paternal grandfather and paternal grandmother.    ROS:  Please see the history of present illness.   All other systems are personally reviewed and negative.    PHYSICAL EXAM: VS:  BP 136/70   Pulse (!) 56   Ht 5\' 5"  (1.651 m)   Wt 186 lb 3.2 oz (84.5 kg)   LMP  (LMP Unknown)   SpO2 98%   BMI 30.99 kg/m  , BMI Body mass index is 30.99 kg/m. GEN: Well nourished, well developed, in no acute distress  HEENT: normal  Neck: no JVD, carotid bruits, or masses Cardiac: RRR; no murmurs, rubs, or gallops,no edema  Respiratory:  clear to auscultation bilaterally, normal work of breathing GI: soft, nontender, nondistended, + BS MS: no deformity or atrophy  Skin: warm  and dry  Neuro:  Strength and sensation are intact Psych: euthymic mood, full affect  EKG:  EKG is ordered today. The ekg ordered today is personally reviewed and shows sinus rhythm 56 bpm, PR 228 msec, incomplete RBBB   Recent Labs: 01/24/2016: B Natriuretic Peptide 181.0 11/18/2016: Hemoglobin 11.3; Platelets 291; TSH 2.719 11/20/2016: ALT 13; BUN 20; Creatinine, Ser 0.96; Potassium 4.5; Sodium 139  personally reviewed   Lipid Panel     Component Value Date/Time   CHOL 147 11/20/2016 0919   TRIG 57 11/20/2016 0919   HDL 47 11/20/2016 0919   CHOLHDL 3 05/10/2016 1313   VLDL 15.2 05/10/2016 1313   LDLCALC 89 11/20/2016 0919   LDLDIRECT 163.1 07/04/2011 0922   personally reviewed   Wt Readings from  Last 3 Encounters:  12/02/16 186 lb 3.2 oz (84.5 kg)  11/25/16 186 lb (84.4 kg)  11/20/16 185 lb (83.9 kg)      Other studies personally reviewed: Additional studies/ records that were reviewed today include: AF clinic notes, prior echo, Dr [name] Antonio notes  Review of the above records today demonstrates: preserved EF, mild to moderate MR   ASSESSMENT AND PLAN:  1.  Persistent afib The patient has symptomatic persistent afib.  She has failed medical therapy with flecainide and diltiazem Therapeutic strategies for afib including medicine and ablation were discussed in detail with the patient today. Risk, benefits, and alternatives to EP study and radiofrequency ablation for afib were also discussed in detail today. These risks include but are not limited to stroke, bleeding, vascular damage, tamponade, perforation, damage to the esophagus, lungs, and other structures, pulmonary vein stenosis, worsening renal function, and death. The patient understands these risk and wishes to proceed.  We will therefore proceed with catheter ablation at the next available time.  Carto, ICE, and anesthesia are required.  Cardiac CT is require prior to ablation.  2. HTN Stable No change required  today  3. Mild to moderate MR Stable No change required today  4. OSA Compliance with CPAP encouraged  5. overwight Body mass index is 30.99 kg/m. Lifestyle modification is encouraged    Current medicines are reviewed at length with the patient today.   The patient does not have concerns regarding her medicines.  The following changes were made today:  none    Signed, Thompson Grayer, MD  12/02/2016 11:58 AM     [facility] [address] [place] [address] [phone] (office) [phone] (fax)

## 2016-12-17 NOTE — Discharge Summary (Signed)
ELECTROPHYSIOLOGY PROCEDURE DISCHARGE SUMMARY    Patient ID: [name],  MRN: [id number], DOB/AGE: [birth year] 71 y.o.  Admit date: 12/17/2016 Discharge date: 12/18/2016  Primary Care Physician: Jinny Sanders, MD Primary Cardiologist: Fletcher Anon Electrophysiologist: Thompson Grayer, MD  Primary Discharge Diagnosis:  Persistent atrial fibrillation  Secondary Discharge Diagnosis:  1.  HTN 2.  Diabetes 3.  OSA on CPAP   Procedures This Admission:  1.  Electrophysiology study and radiofrequency catheter ablation on 12/17/16 by Dr Thompson Grayer.  This study demonstrated sinus rhythm upon presentation; intracardiac echo reveals a moderate to large sized left atrium; successful electrical isolation and anatomical encircling of all four pulmonary veins with radiofrequency current; additional mapping and ablation within the left atrium due to persistence of atrial fibrillation with a posterior wall box demonstrated; atrial fibrillation successfully cardioverted to sinus rhythm; cavo-tricuspid isthmus ablation performed with complete bidirectional isthmus block achieved; multiple atypical atrial flutter circuits also demonstrated today; no early apparent complications.  Brief HPI: [name] is a 71 y.o. female with a history of persistent atrial fibrillation.  They have failed medical therapy with flecainide. Risks, benefits, and alternatives to catheter ablation of atrial fibrillation were reviewed with the patient who wished to proceed.  The patient underwent cardiac CT prior to the procedure which demonstrated no LAA thrombus.    Hospital Course:  The patient was admitted and underwent EPS/RFCA of atrial fibrillation with details as outlined above.  They were monitored on telemetry overnight which demonstrated sinus rhythm with runs of AF.  Groin was without complication on the day of discharge.  The patient was examined and considered to be stable for discharge.  Wound care and  restrictions were reviewed with the patient.  The patient will be seen back by [name], NP in 4 weeks and Dr Rayann Heman in 12 weeks for post ablation follow up.   This patients CHA2DS2-VASc Score and unadjusted Ischemic Stroke Rate (% per year) is equal to 4.8 % stroke rate/year from a score of 4 Above score calculated as 1 point each if present [CHF, HTN, DM, Vascular=MI/PAD/Aortic Plaque, Age if 65-74, or Female] Above score calculated as 2 points each if present [Age > 75, or Stroke/TIA/TE]   Physical Exam: Vitals:   12/17/16 1900 12/17/16 1931 12/17/16 2000 12/18/16 0439  BP: (!) 152/63 138/71  (!) 141/66  Pulse: 70 (!) 58 70   Resp: 15 16 20 17   Temp:    98.1 F (36.7 C)  TempSrc:    Oral  SpO2: 98% 93% 96% 98%  Weight:    185 lb (83.9 kg)  Height:        GEN- The patient is well appearing, alert and oriented x 3 today.   HEENT: normocephalic, atraumatic; sclera clear, conjunctiva pink; hearing intact; oropharynx clear; neck supple  Lungs- Clear to ausculation bilaterally, normal work of breathing.  No wheezes, rales, rhonchi Heart- Regular rate and rhythm  GI- soft, non-tender, non-distended, bowel sounds present  Extremities- no clubbing, cyanosis, or edema; DP/PT/radial pulses 2+ bilaterally, groin without hematoma/bruit MS- no significant deformity or atrophy Skin- warm and dry, no rash or lesion Psych- euthymic mood, full affect Neuro- strength and sensation are intact   Labs:   Lab Results  Component Value Date   WBC 5.1 12/17/2016   HGB 11.8 (L) 12/17/2016   HCT 36.5 12/17/2016   MCV 88.6 12/17/2016   PLT 273 12/17/2016     Recent Labs Lab 12/17/16 0604  NA 139  K 4.1  CL 106  CO2 26  BUN 15  CREATININE 0.94  CALCIUM 9.4  GLUCOSE 139*     Discharge Medications:  Allergies as of 12/18/2016      Reactions   Pravastatin Sodium Other (See Comments)   Leg pain   Contrast Media [iodinated Diagnostic Agents] Rash   Cortisone Hives, Rash        Medication List    STOP taking these medications   ranitidine 75 MG tablet Commonly known as:  ZANTAC     TAKE these medications   atorvastatin 10 MG tablet Commonly known as:  LIPITOR TAKE ONE TABLET BY MOUTH EVERY NIGHT AT BEDTIME   Calcium Carb-Ergocalciferol 500-200 MG-UNIT Tabs Take 1 tablet by mouth daily.   diltiazem 180 MG 24 hr capsule Commonly known as:  CARDIZEM CD TAKE 1 CAPSULE BY MOUTH EVERY DAY (NEED TO SCHEDULE APPT [phone])   ELIQUIS 5 MG Tabs tablet Generic drug:  apixaban TAKE 1 TABLET BY MOUTH TWICE A DAY   escitalopram 10 MG tablet Commonly known as:  LEXAPRO TAKE ONE TABLET BY MOUTH ONCE DAILY   ferrous sulfate 325 (65 FE) MG tablet Take 325 mg by mouth every other day.   flecainide 100 MG tablet Commonly known as:  TAMBOCOR TAKE 1 TABLET (100 MG TOTAL) BY MOUTH 2 (TWO) TIMES DAILY.   glucose blood test strip Commonly known as:  ONE TOUCH ULTRA TEST CHECK BLOOD SUGAR 2 TIMES A DAY AS DIRECTED   Insulin Glargine 100 UNIT/ML Solostar Pen Commonly known as:  LANTUS Inject 10-14 Units into the skin at bedtime as needed.   Insulin Pen Needle 31G X 8 MM Misc Commonly known as:  B-D ULTRAFINE III SHORT PEN Use as directed Dx:250.00   linagliptin 5 MG Tabs tablet Commonly known as:  TRADJENTA Take 1 tablet (5 mg total) by mouth daily.   metFORMIN 500 MG 24 hr tablet Commonly known as:  GLUCOPHAGE-XR Take 4 tablets (2,000 mg total) by mouth daily with supper. What changed:  how much to take  when to take this   multivitamin tablet Take 1 tablet by mouth daily.   oxybutynin 5 MG 24 hr tablet Commonly known as:  DITROPAN-XL TAKE ONE TABLET BY MOUTH EVERY NIGHT AT BEDTIME   pantoprazole 40 MG tablet Commonly known as:  PROTONIX Take 1 tablet (40 mg total) by mouth daily.   sennosides-docusate sodium 8.6-50 MG tablet Commonly known as:  SENOKOT-S Take 1 tablet by mouth daily as needed for constipation.   traZODone 50 MG  tablet Commonly known as:  DESYREL Take 1-2 tablets (50-100 mg total) by mouth at bedtime as needed for sleep. What changed:  how much to take  when to take this   Vitamin D (Ergocalciferol) 50000 units Caps capsule Commonly known as:  DRISDOL Take 1 capsule (50,000 Units total) by mouth every 7 (seven) days.       Disposition:  Discharge Instructions    Diet - low sodium heart healthy    Complete by:  As directed    Increase activity slowly    Complete by:  As directed      Follow-up Information    [facility] [place] Follow up on 01/16/2017.   Specialty:  Cardiology Why:  at [facility] information: [address] [id number] [place] [address] [phone]       Thompson Grayer, MD Follow up on 03/19/2017.   Specialty:  Cardiology Why:  at 12noon Contact information:  [address] [place] [address] [phone]           Duration of Discharge Encounter: Greater than 30 minutes including physician time.  Signed, Chanetta Marshall, NP 12/18/2016 7:47 AM   I have seen, examined the patient, and reviewed the above assessment and plan.  Changes to above are made where necessary.  On exam, RRR.  DC to home with routine wound care and follow-up.  Co Sign: Thompson Grayer, MD 12/18/2016 9:05 PM

## 2016-12-18 ENCOUNTER — Encounter (HOSPITAL_COMMUNITY): Payer: Self-pay | Admitting: Internal Medicine

## 2016-12-18 DIAGNOSIS — I481 Persistent atrial fibrillation: Secondary | ICD-10-CM | POA: Diagnosis not present

## 2016-12-18 DIAGNOSIS — I10 Essential (primary) hypertension: Secondary | ICD-10-CM | POA: Diagnosis not present

## 2016-12-18 DIAGNOSIS — E119 Type 2 diabetes mellitus without complications: Secondary | ICD-10-CM | POA: Diagnosis not present

## 2016-12-18 DIAGNOSIS — I484 Atypical atrial flutter: Secondary | ICD-10-CM | POA: Diagnosis not present

## 2016-12-18 LAB — GLUCOSE, CAPILLARY: GLUCOSE-CAPILLARY: 135 mg/dL — AB (ref 65–99)

## 2016-12-18 MED ORDER — PANTOPRAZOLE SODIUM 40 MG PO TBEC: 40.0000 mg | DELAYED_RELEASE_TABLET | Freq: Every day | ORAL | 1 refills | Status: DC

## 2016-12-18 NOTE — Anesthesia Postprocedure Evaluation (Signed)
Anesthesia Post Note  Patient: [name]  Procedure(s) Performed: ATRIAL FIBRILLATION ABLATION (N/A )     Patient location during evaluation: PACU Anesthesia Type: General Level of consciousness: awake and alert Pain management: pain level controlled Vital Signs Assessment: post-procedure vital signs reviewed and stable Respiratory status: spontaneous breathing, nonlabored ventilation, respiratory function stable and patient connected to nasal cannula oxygen Cardiovascular status: blood pressure returned to baseline and stable Postop Assessment: no apparent nausea or vomiting Anesthetic complications: no    Last Vitals:  Vitals:   12/18/16 0439 12/18/16 0815  BP: (!) 141/66 (!) 116/58  Pulse:  68  Resp: 17   Temp: 36.7 C 36.6 C  SpO2: 98% 96%    Last Pain:  Vitals:   12/18/16 0815  TempSrc: Oral                 Renise Gillies EDWARD

## 2016-12-19 ENCOUNTER — Ambulatory Visit (INDEPENDENT_AMBULATORY_CARE_PROVIDER_SITE_OTHER): Payer: 59 | Admitting: Physician Assistant

## 2016-12-19 ENCOUNTER — Other Ambulatory Visit: Payer: Self-pay | Admitting: Internal Medicine

## 2016-12-19 ENCOUNTER — Encounter: Payer: Self-pay | Admitting: Internal Medicine

## 2016-12-19 ENCOUNTER — Ambulatory Visit (INDEPENDENT_AMBULATORY_CARE_PROVIDER_SITE_OTHER): Payer: 59 | Admitting: Internal Medicine

## 2016-12-19 VITALS — BP 102/58 | HR 60 | Wt 178.0 lb

## 2016-12-19 VITALS — BP 108/59 | HR 61 | Temp 98.5°F | Ht 65.0 in | Wt 180.0 lb

## 2016-12-19 DIAGNOSIS — Z9189 Other specified personal risk factors, not elsewhere classified: Secondary | ICD-10-CM

## 2016-12-19 DIAGNOSIS — Z6830 Body mass index (BMI) 30.0-30.9, adult: Secondary | ICD-10-CM

## 2016-12-19 DIAGNOSIS — E119 Type 2 diabetes mellitus without complications: Secondary | ICD-10-CM

## 2016-12-19 DIAGNOSIS — Z23 Encounter for immunization: Secondary | ICD-10-CM | POA: Diagnosis not present

## 2016-12-19 DIAGNOSIS — Z794 Long term (current) use of insulin: Secondary | ICD-10-CM | POA: Diagnosis not present

## 2016-12-19 DIAGNOSIS — E669 Obesity, unspecified: Secondary | ICD-10-CM

## 2016-12-19 DIAGNOSIS — E663 Overweight: Secondary | ICD-10-CM | POA: Diagnosis not present

## 2016-12-19 DIAGNOSIS — E559 Vitamin D deficiency, unspecified: Secondary | ICD-10-CM | POA: Diagnosis not present

## 2016-12-19 MED ORDER — VITAMIN D (ERGOCALCIFEROL) 1.25 MG (50000 UNIT) PO CAPS: 50000.0000 [IU] | ORAL_CAPSULE | ORAL | 0 refills | Status: DC

## 2016-12-19 NOTE — Progress Notes (Signed)
Office: [phone]  /  Fax: [phone]   HPI:   Chief Complaint: OBESITY [name] is here to discuss her progress with her obesity treatment plan. She is on the Category 1 plan and Pescatarian dinner options and is following her eating plan approximately 50 % of the time. She states she is exercising 0 minutes 0 times per week. [name] continues to do well with weight loss. She is status post heart ablation surgery. [name] states she feels well. She is motivated about weight loss and hunger is well controlled. Her weight is 180 lb (81.6 kg) today and has had a weight loss of 1 pound over a period of 2 weeks since her last visit. She has lost 5 lbs since starting treatment with [name].  Vitamin D deficiency [name] has a diagnosis of vitamin D deficiency. She is currently taking vit D and denies nausea, vomiting or muscle weakness.  At risk for osteopenia and osteoporosis [name] is at higher risk of osteopenia and osteoporosis due to vitamin D deficiency.   Diabetes II [name] has a diagnosis of diabetes type II. [name] states fasting BGs range in the 200's and denies any hypoglycemic episodes. She states her primary care provider stopped her insulin as of today. She has been working on intensive lifestyle modifications including diet, exercise, and weight loss to help control her blood glucose levels.   ALLERGIES: Allergies  Allergen Reactions  . Pravastatin Sodium Other (See Comments)    Leg pain  . Contrast Media [Iodinated Diagnostic Agents] Rash  . Cortisone Hives and Rash    MEDICATIONS: Current Outpatient Prescriptions on File Prior to Visit  Medication Sig Dispense Refill  . atorvastatin (LIPITOR) 10 MG tablet TAKE ONE TABLET BY MOUTH EVERY NIGHT AT BEDTIME 90 tablet 3  . Calcium Carb-Ergocalciferol 500-200 MG-UNIT TABS Take 1 tablet by mouth daily.     [name] diltiazem (CARDIZEM CD) 180 MG 24 hr capsule TAKE 1 CAPSULE BY MOUTH EVERY DAY (NEED TO SCHEDULE APPT [phone]) 30 capsule 2    . ELIQUIS 5 MG TABS tablet TAKE 1 TABLET BY MOUTH TWICE A DAY 60 tablet 3  . escitalopram (LEXAPRO) 10 MG tablet TAKE ONE TABLET BY MOUTH ONCE DAILY 90 tablet 1  . ferrous sulfate 325 (65 FE) MG tablet Take 325 mg by mouth every other day.    . flecainide (TAMBOCOR) 100 MG tablet TAKE 1 TABLET (100 MG TOTAL) BY MOUTH 2 (TWO) TIMES DAILY. 180 tablet 0  . glucose blood (ONE TOUCH ULTRA TEST) test strip CHECK BLOOD SUGAR 2 TIMES A DAY AS DIRECTED 200 each 3  . linagliptin (TRADJENTA) 5 MG TABS tablet Take 1 tablet (5 mg total) by mouth daily. 30 tablet 11  . metFORMIN (GLUCOPHAGE-XR) 500 MG 24 hr tablet Take 4 tablets (2,000 mg total) by mouth daily with supper. (Patient taking differently: Take 1,000 mg by mouth 2 (two) times daily. ) 360 tablet 3  . Multiple Vitamin (MULTIVITAMIN) tablet Take 1 tablet by mouth daily.      [name] oxybutynin (DITROPAN-XL) 5 MG 24 hr tablet TAKE ONE TABLET BY MOUTH EVERY NIGHT AT BEDTIME 30 tablet 11  . pantoprazole (PROTONIX) 40 MG tablet Take 1 tablet (40 mg total) by mouth daily. 30 tablet 1  . sennosides-docusate sodium (SENOKOT-S) 8.6-50 MG tablet Take 1 tablet by mouth daily as needed for constipation.    . traZODone (DESYREL) 50 MG tablet Take 1-2 tablets (50-100 mg total) by mouth at bedtime as needed for sleep. (Patient taking differently:  Take 50 mg by mouth at bedtime. ) 60 tablet 5  . Vitamin D, Ergocalciferol, (DRISDOL) 50000 units CAPS capsule Take 1 capsule (50,000 Units total) by mouth every 7 (seven) days. 4 capsule 0   No current facility-administered medications on file prior to visit.     PAST MEDICAL HISTORY: Past Medical History:  Diagnosis Date  . Anemia   . Anxiety   . Constipation   . Depressive disorder, not elsewhere classified   . Diaphragmatic hernia without mention of obstruction or gangrene   . Disorders of bursae and tendons in shoulder region, unspecified   . Diverticulosis of colon (without mention of hemorrhage)   . GERD  (gastroesophageal reflux disease)   . History of hiatal hernia   . HTN (hypertension)   . Hypothyroidism   . Internal hemorrhoids without mention of complication   . Obesity, unspecified   . OSA on CPAP    compliant with CPAP  . Other urinary incontinence   . Persistent atrial fibrillation ([place])   . Pure hypercholesterolemia   . Type II diabetes mellitus ([place])   . Urinary frequency   . Urinary urgency   . Vitamin B 12 deficiency     PAST SURGICAL HISTORY: Past Surgical History:  Procedure Laterality Date  . ABDOMINAL HYSTERECTOMY  1972   Have both ovaries  . ATRIAL FIBRILLATION ABLATION  12/17/2016  . ATRIAL FIBRILLATION ABLATION N/A 12/17/2016   Procedure: ATRIAL FIBRILLATION ABLATION;  Surgeon: Thompson Grayer, MD;  Location: [facility];  Service: Cardiovascular;  Laterality: N/A;  . [place]   [facility]; "Dr. [name]"  . CARDIOVERSION N/A 05/27/2016   Procedure: CARDIOVERSION;  Surgeon: [name], MD;  Location: [facility];  Service: Cardiovascular;  Laterality: N/A;  . CARDIOVERSION N/A 11/25/2016   Procedure: CARDIOVERSION;  Surgeon: [name], MD;  Location: [facility];  Service: Cardiovascular;  Laterality: N/A;  . CATARACT EXTRACTION W/ INTRAOCULAR LENS  IMPLANT, BILATERAL Bilateral   . ELECTROPHYSIOLOGIC STUDY N/A 01/24/2016   Procedure: CARDIOVERSION;  Surgeon: [name], MD;  Location: [facility];  Service: Cardiovascular;  Laterality: N/A;  . LAPAROSCOPIC CHOLECYSTECTOMY    . REDUCTION MAMMAPLASTY Bilateral   . TONSILLECTOMY      SOCIAL HISTORY: Social History  Substance Use Topics  . Smoking status: Never Smoker  . Smokeless tobacco: Never Used  . Alcohol use No    FAMILY HISTORY: Family History  Problem Relation Age of Onset  . Cancer Mother        fullopian tube  . Dementia Mother   . Pulmonary embolism Mother   . Hyperlipidemia Mother   . Diabetes Mother   . Cancer Father        lung  . Hyperlipidemia Father     . Hypertension Sister   . Diabetes Brother   . Stroke Paternal Grandmother   . Stroke Paternal Grandfather     ROS: Review of Systems  Constitutional: Positive for weight loss.  Gastrointestinal: Negative for nausea and vomiting.  Musculoskeletal:       Negative muscle weakness  Endo/Heme/Allergies:       Negative hypoglycemia    PHYSICAL EXAM: Blood pressure (!) 108/59, pulse 61, temperature 98.5 F (36.9 C), temperature source Oral, height 5\' 5"  (1.651 m), weight 180 lb (81.6 kg), SpO2 99 %. Body mass index is 29.95 kg/m. Physical Exam  Constitutional: She is oriented to person, place, and time. She appears well-developed and well-nourished.  Cardiovascular: Normal rate.  Pulmonary/Chest: Effort normal.  Musculoskeletal: Normal range of motion.  Neurological: She is oriented to person, place, and time.  Skin: Skin is warm and dry.  Psychiatric: She has a normal mood and affect. Her behavior is normal.  Vitals reviewed.   RECENT LABS AND TESTS: BMET    Component Value Date/Time   NA 139 12/17/2016 0604   NA 139 12/02/2016 1233   K 4.1 12/17/2016 0604   CL 106 12/17/2016 0604   CO2 26 12/17/2016 0604   GLUCOSE 139 (H) 12/17/2016 0604   BUN 15 12/17/2016 0604   BUN 17 12/02/2016 1233   CREATININE 0.94 12/17/2016 0604   CREATININE 0.94 09/24/2013 1610   CALCIUM 9.4 12/17/2016 0604   GFRNONAA >60 12/17/2016 0604   GFRAA >60 12/17/2016 0604   Lab Results  Component Value Date   HGBA1C 6.4 (H) 11/20/2016   HGBA1C 6.9 09/16/2016   HGBA1C 6.9 05/23/2016   HGBA1C 6.8 02/23/2016   HGBA1C 6.8 11/24/2015   Lab Results  Component Value Date   INSULIN 6.0 11/20/2016   CBC    Component Value Date/Time   WBC 5.1 12/17/2016 0604   RBC 4.12 12/17/2016 0604   HGB 11.8 (L) 12/17/2016 0604   HGB 11.7 12/02/2016 1233   HCT 36.5 12/17/2016 0604   HCT 35.8 12/02/2016 1233   PLT 273 12/17/2016 0604   PLT 297 12/02/2016 1233   MCV 88.6 12/17/2016 0604   MCV 88  12/02/2016 1233   MCH 28.6 12/17/2016 0604   MCHC 32.3 12/17/2016 0604   RDW 14.0 12/17/2016 0604   RDW 14.6 12/02/2016 1233   LYMPHSABS 2.0 12/02/2016 1233   MONOABS 0.5 05/24/2016 1024   EOSABS 0.1 12/02/2016 1233   BASOSABS 0.0 12/02/2016 1233   Iron/TIBC/Ferritin/ %Sat    Component Value Date/Time   IRON 57 05/24/2016 1024   FERRITIN 5.3 (L) 05/24/2016 1024   IRONPCTSAT 12.1 (L) 05/24/2016 1024   Lipid Panel     Component Value Date/Time   CHOL 147 11/20/2016 0919   TRIG 57 11/20/2016 0919   HDL 47 11/20/2016 0919   CHOLHDL 3 05/10/2016 1313   VLDL 15.2 05/10/2016 1313   LDLCALC 89 11/20/2016 0919   LDLDIRECT 163.1 07/04/2011 0922   Hepatic Function Panel     Component Value Date/Time   PROT 6.9 11/20/2016 0919   ALBUMIN 4.3 11/20/2016 0919   AST 16 11/20/2016 0919   ALT 13 11/20/2016 0919   ALKPHOS 64 11/20/2016 0919   BILITOT 0.3 11/20/2016 0919   BILIDIR 0.0 07/10/2010 0822   IBILI 0.3 04/02/2010 2210      Component Value Date/Time   TSH 2.719 11/18/2016 1030   TSH 3.402 01/22/2016 1450   TSH 2.885 09/24/2013 1610    ASSESSMENT AND PLAN: Vitamin D deficiency - Plan: Vitamin D, Ergocalciferol, (DRISDOL) 50000 units CAPS capsule  Type 2 diabetes mellitus without complication, without long-term current use of insulin (HCC)  At risk for osteoporosis  Class 1 obesity with serious comorbidity and body mass index (BMI) of 30.0 to 30.9 in adult, unspecified obesity type  PLAN:  Vitamin D Deficiency Takiah was informed that low vitamin D levels contributes to fatigue and are associated with obesity, breast, and colon cancer. She agrees to continue to take prescription Vit D @50 ,000 IU every week #4 with no refills and will follow up for routine testing of vitamin D, at least 2-3 times per year. She was informed of the risk of over-replacement of vitamin D and agrees  to not increase her dose unless he discusses this with [name] first. Embry agrees to follow up with  our clinic in 2 weeks.  At risk for osteopenia and osteoporosis Bradlee is at risk for osteopenia and osteoporosis due to her vitamin D deficiency. She was encouraged to take her vitamin D and follow her higher calcium diet and increase strengthening exercise to help strengthen her bones and decrease her risk of osteopenia and osteoporosis.  Diabetes II Gracelin has been given extensive diabetes education by myself today including ideal fasting and post-prandial blood glucose readings, individual ideal Hgb A1c goals  and hypoglycemia prevention. We discussed the importance of good blood sugar control to decrease the likelihood of diabetic complications such as nephropathy, neuropathy, limb loss, blindness, coronary artery disease, and death. We discussed the importance of intensive lifestyle modification including diet, exercise and weight loss as the first line treatment for diabetes. Delpha agrees to continue metformin and will follow up at the agreed upon time.  Obesity Folasade is currently in the action stage of change. As such, her goal is to continue with weight loss efforts She has agreed to follow the Category 1 plan Jacie has been instructed to work up to a goal of 150 minutes of combined cardio and strengthening exercise per week for weight loss and overall health benefits. We discussed the following Behavioral Modification Strategies today: increasing lean protein intake and work on meal planning and easy cooking plans  Kareena has agreed to follow up with our clinic in 2 weeks. She was informed of the importance of frequent follow up visits to maximize her success with intensive lifestyle modifications for her multiple health conditions.  I, Doreene Nest, am acting as transcriptionist for Lacy Duverney, PA-C  I have reviewed the above documentation for accuracy and completeness, and I agree with the above. -Lacy Duverney, PA-C  I have reviewed the above note and agree with the plan. -Dennard Nip, MD   OBESITY BEHAVIORAL INTERVENTION VISIT  Today's visit was # 3 out of 22.  Starting weight: 185 lbs Starting date: 11/20/16 Today's weight : 180 lbs  Today's date: 12/19/2016 Total lbs lost to date: 5 (Patients must lose 7 lbs in the first 6 months to continue with counseling)   ASK: We discussed the diagnosis of obesity with [name] today and Zaydee agreed to give [name] permission to discuss obesity behavioral modification therapy today.  ASSESS: Rayshell has the diagnosis of obesity and her BMI today is 29.95 Dawne is in the action stage of change   ADVISE: Samariah was educated on the multiple health risks of obesity as well as the benefit of weight loss to improve her health. She was advised of the need for long term treatment and the importance of lifestyle modifications.  AGREE: Multiple dietary modification options and treatment options were discussed and  Dachelle agreed to follow the Category 1 plan We discussed the following Behavioral Modification Strategies today: increasing lean protein intake and work on meal planning and easy cooking plans

## 2016-12-19 NOTE — Progress Notes (Signed)
Patient ID: [name], female   DOB: 1945/08/05, 71 y.o.   MRN: [id number]  HPI: [name] is a 71 y.o.-year-old female, returning for f/u for DM2, dx 1995, insulin-dependent since 2014, with improved control, without long term complications. Last visit 3 months ago. She is here with her husband, who offers part of the history, especially about her medical history and her weight loss journey.  Patient has Afib >> had repeated cardioversion episodes. On Flecainide, Diltiazem, Eloquis.  She is working with Dr Leafy Ro >> lost 18 lbs since 09/2016!  Last hemoglobin A1c was: Lab Results  Component Value Date   HGBA1C 6.4 (H) 11/20/2016   HGBA1C 6.9 09/16/2016   HGBA1C 6.9 05/23/2016   Pt is on a regimen of: - Metformin XR 1000 mg 2x a day (off for 2 weeks for tests - Lantus taken then) - Januvia 100 mg daily in am >> Tradjenta 5 mg daily before lunch - Lantus 22 >> 18 units in am >> Basaglar 12 >> 14 units in hs - off few days a week - sugars not higher in am  Pt checks her sugars 2x a day: - am: 90-120, 150 >> 90-120s >> 90-120 >> 80-124 - 2h after b'fast: 90-103 >> 113-141 >> n/c - before lunch:90-130 >> 100-125, 199 (forgot meds) >> n/c - 2h after lunch:  160-180 >> 140-160s >> n/c - before dinner: 211, 209 >> n/c >> 102-144 >> n/c - 2h after dinner:  180s, 289 - stress, around her A fib episode >> n/c >> 93-170, 191, 198 - bedtime:  n/c >> 120-160 >> 140-160 >> n/c - nighttime: n/c Lowest sugar was 90 >> 79; she has hypoglycemia awareness at 80s Highest sugar was 199 (forgot med) >> 200  Pt's meals are: - Breakfast: cereals + 2% milk; bread + cheese; bread + PB - Lunch: sandwich; soup - Dinner: chicken; steak; seafood - Snacks: 2: PB crackers  - No CKD, last BUN/creatinine:  Lab Results  Component Value Date   BUN 15 12/17/2016   CREATININE 0.94 12/17/2016   - last set of lipids: Lab Results  Component Value Date   CHOL 147 11/20/2016   HDL 47 11/20/2016   LDLCALC 89 11/20/2016   LDLDIRECT 163.1 07/04/2011   TRIG 57 11/20/2016   CHOLHDL 3 05/10/2016  On Lipitor - last eye exam was in 05/2016 >> No DR - [facility].  + cataracts. She had surgery in 04 and 07/2016 - denies numbness and tingling in her feet.  ROS: Constitutional: + weight loss, no fatigue, no subjective hyperthermia, no subjective hypothermia Eyes: no blurry vision, no xerophthalmia ENT: no sore throat, no nodules palpated in throat, no dysphagia, no odynophagia, no hoarseness Cardiovascular: no CP/no SOB/no palpitations/no leg swelling Respiratory: no cough/no SOB/no wheezing Gastrointestinal: no N/no V/no D/no C/no acid reflux Musculoskeletal: no muscle aches/no joint aches Skin: +rash, no hair loss Neurological: no tremors/no numbness/no tingling/no dizziness  I reviewed pt's medications, allergies, PMH, social hx, family hx, and changes were documented in the history of present illness. Otherwise, unchanged from my initial visit note.  PE: BP (!) 102/58 (BP Location: Left Arm, Patient Position: Sitting, Cuff Size: Normal)   Pulse 60   Wt 178 lb (80.7 kg)   LMP  (LMP Unknown)   SpO2 98%   BMI 29.62 kg/m  Body mass index is 29.62 kg/m. Wt Readings from Last 3 Encounters:  12/19/16 178 lb (80.7 kg)  12/18/16 185 lb (83.9 kg)  12/05/16  181 lb (82.1 kg)   Constitutional: overweight, in NAD Eyes: PERRLA, EOMI, no exophthalmos ENT: moist mucous membranes, no thyromegaly, no cervical lymphadenopathy Cardiovascular: RRR, No MRG Respiratory: CTA B Gastrointestinal: abdomen soft, NT, ND, BS+ Musculoskeletal: no deformities, strength intact in all 4 Skin: moist, warm, no rashes Neurological: no tremor with outstretched hands, DTR normal in all 4  ASSESSMENT: 1. DM2, insulin-dependent, controlled, without long term complications, but with hyperglycemia - she was contemplating gastric sleeve sx >> on hold now  2. Overweight  PLAN:  1. Patient with  long-standing, no more controlled diabetes,but with significant control since last visit after she started to work with weight loss specialist. She lost 18 pounds and she actually noticed that her sugars are not higher in the morning if she skips insulin the night before. Therefore, I will advise her to stay off the insulin for now but to check sugars more often and may need to restart at the lower dose in the future. We'll continue metformin and Tradjenta for now. - reviewed latest HbA1c from last mo: 6.4% (great! - I suggested to:   Patient Instructions  Please stop: - Basaglar, but if you need to restart, start at 8 units   Continue: - Metformin ER 1000 mg 2x a day - Tradjenta 5 mg before lunch  Please come back for a follow-up appointment in 3-4 months.  - continue checking sugars at different times of the day - check 1x a day, rotating checks - advised for yearly eye exams >> she is UTD - Will give her the flu shot today - Return to clinic in 3-4 mo with sugar log    2. Overweight - She started to work with Dr. Dennard Nip in the [facility] and she is doing great, lost 18 pounds since I last saw her. She is also feeling well and noticed that if she skips insulin sugars are not higher in the morning. Therefore, will stop insulin completely, which will help even more with her weight loss.   [name], MD PhD [facility]

## 2016-12-19 NOTE — Patient Instructions (Addendum)
Patient Instructions  Please stop: - Basaglar, but if you need to restart, start at 8 units   Continue: - Metformin ER 1000 mg 2x a day - Tradjenta 5 mg before lunch  Please come back for a follow-up appointment in 3-4 months.

## 2016-12-19 NOTE — Addendum Note (Signed)
Addended by: Wyvonne Lenz on: 12/19/2016 10:49 AM   Modules accepted: Orders

## 2017-01-02 ENCOUNTER — Ambulatory Visit (INDEPENDENT_AMBULATORY_CARE_PROVIDER_SITE_OTHER): Payer: 59 | Admitting: Physician Assistant

## 2017-01-02 VITALS — BP 116/67 | HR 58 | Temp 97.7°F | Ht 65.0 in | Wt 181.0 lb

## 2017-01-02 DIAGNOSIS — K5909 Other constipation: Secondary | ICD-10-CM | POA: Diagnosis not present

## 2017-01-02 DIAGNOSIS — E66811 Obesity, class 1: Secondary | ICD-10-CM | POA: Insufficient documentation

## 2017-01-02 DIAGNOSIS — E559 Vitamin D deficiency, unspecified: Secondary | ICD-10-CM

## 2017-01-02 DIAGNOSIS — Z9189 Other specified personal risk factors, not elsewhere classified: Secondary | ICD-10-CM

## 2017-01-02 DIAGNOSIS — K5900 Constipation, unspecified: Secondary | ICD-10-CM | POA: Insufficient documentation

## 2017-01-02 DIAGNOSIS — E669 Obesity, unspecified: Secondary | ICD-10-CM | POA: Insufficient documentation

## 2017-01-02 DIAGNOSIS — Z6830 Body mass index (BMI) 30.0-30.9, adult: Secondary | ICD-10-CM | POA: Diagnosis not present

## 2017-01-02 MED ORDER — POLYETHYLENE GLYCOL 3350 17 GM/SCOOP PO POWD: 17.0000 g | Freq: Every day | ORAL | 0 refills | Status: DC

## 2017-01-02 NOTE — Progress Notes (Signed)
Office: [phone]  /  Fax: [phone]   HPI:   Chief Complaint: OBESITY [name] is here to discuss her progress with her obesity treatment plan. She is on the Category 1 plan and is following her eating plan approximately 50 % of the time. She states she is exercising 0 minutes 0 times per week. [name] states she is mindful of her food choices but she has not been eating the daily required protein. States she would like to incorporate more variety at her meals. Her weight is 181 lb (82.1 kg) today and has had a weight gain of 1 pound over a period of 4 weeks since her last visit. She has lost 4 lbs since starting treatment with [name].  Vitamin D deficiency [name] has a diagnosis of vitamin D deficiency. She is currently taking vit D and denies nausea, vomiting or muscle weakness.  At risk for osteopenia and osteoporosis [name] is at higher risk of osteopenia and osteoporosis due to vitamin D deficiency.   Constipation [name] notes constipation for the last few weeks, worse since attempting weight loss. She states BM are less frequent and are not hard and painful. She denies hematochezia or melena. She admits to drinking less H20 recently.  ALLERGIES: Allergies  Allergen Reactions   Pravastatin Sodium Other (See Comments)    Leg pain   Contrast Media [Iodinated Diagnostic Agents] Rash   Cortisone Hives and Rash    MEDICATIONS: Current Outpatient Prescriptions on File Prior to Visit  Medication Sig Dispense Refill   atorvastatin (LIPITOR) 10 MG tablet TAKE ONE TABLET BY MOUTH EVERY NIGHT AT BEDTIME 90 tablet 3   Calcium Carb-Ergocalciferol 500-200 MG-UNIT TABS Take 1 tablet by mouth daily.      diltiazem (CARDIZEM CD) 180 MG 24 hr capsule TAKE 1 CAPSULE BY MOUTH EVERY DAY (NEED TO SCHEDULE APPT [phone]) 30 capsule 2   ELIQUIS 5 MG TABS tablet TAKE 1 TABLET BY MOUTH TWICE A DAY 60 tablet 3   escitalopram (LEXAPRO) 10 MG tablet TAKE ONE TABLET BY MOUTH ONCE DAILY 90 tablet  1   ferrous sulfate 325 (65 FE) MG tablet Take 325 mg by mouth every other day.     flecainide (TAMBOCOR) 100 MG tablet TAKE 1 TABLET (100 MG TOTAL) BY MOUTH 2 (TWO) TIMES DAILY. 180 tablet 0   glucose blood (ONE TOUCH ULTRA TEST) test strip CHECK BLOOD SUGAR 2 TIMES A DAY AS DIRECTED 200 each 3   linagliptin (TRADJENTA) 5 MG TABS tablet Take 1 tablet (5 mg total) by mouth daily. 30 tablet 11   metFORMIN (GLUCOPHAGE-XR) 500 MG 24 hr tablet Take 4 tablets (2,000 mg total) by mouth daily with supper. (Patient taking differently: Take 1,000 mg by mouth 2 (two) times daily. ) 360 tablet 3   metFORMIN (GLUCOPHAGE-XR) 500 MG 24 hr tablet TAKE 4 TABLETS BY MOUTH IN THE EVENING WITH DINNER 360 tablet 2   Multiple Vitamin (MULTIVITAMIN) tablet Take 1 tablet by mouth daily.       ONE TOUCH ULTRA TEST test strip USE TO CHECK BLOOD SUGAR 2 TIMES DAILY AS DIRECTED 200 each 2   oxybutynin (DITROPAN-XL) 5 MG 24 hr tablet TAKE ONE TABLET BY MOUTH EVERY NIGHT AT BEDTIME 30 tablet 11   pantoprazole (PROTONIX) 40 MG tablet Take 1 tablet (40 mg total) by mouth daily. 30 tablet 1   sennosides-docusate sodium (SENOKOT-S) 8.6-50 MG tablet Take 1 tablet by mouth daily as needed for constipation.     traZODone (DESYREL) 50 MG  tablet Take 1-2 tablets (50-100 mg total) by mouth at bedtime as needed for sleep. (Patient taking differently: Take 50 mg by mouth at bedtime. ) 60 tablet 5   Vitamin D, Ergocalciferol, (DRISDOL) 50000 units CAPS capsule Take 1 capsule (50,000 Units total) by mouth every 7 (seven) days. 4 capsule 0   No current facility-administered medications on file prior to visit.     PAST MEDICAL HISTORY: Past Medical History:  Diagnosis Date   Anemia    Anxiety    Constipation    Depressive disorder, not elsewhere classified    Diaphragmatic hernia without mention of obstruction or gangrene    Disorders of bursae and tendons in shoulder region, unspecified    Diverticulosis of colon  (without mention of hemorrhage)    GERD (gastroesophageal reflux disease)    History of hiatal hernia    HTN (hypertension)    Hypothyroidism    Internal hemorrhoids without mention of complication    Obesity, unspecified    OSA on CPAP    compliant with CPAP   Other urinary incontinence    Persistent atrial fibrillation ([place])    Pure hypercholesterolemia    Type II diabetes mellitus ([place])    Urinary frequency    Urinary urgency    Vitamin B 12 deficiency     PAST SURGICAL HISTORY: Past Surgical History:  Procedure Laterality Date   ABDOMINAL HYSTERECTOMY  1972   Have both ovaries   ATRIAL FIBRILLATION ABLATION  12/17/2016   ATRIAL FIBRILLATION ABLATION N/A 12/17/2016   Procedure: ATRIAL FIBRILLATION ABLATION;  Surgeon: Thompson Grayer, MD;  Location: [place] [facility];  Service: Cardiovascular;  Laterality: N/A;   CARDIAC CATHETERIZATION  1990s   [facility]; "Dr. [name]"   CARDIOVERSION N/A 05/27/2016   Procedure: CARDIOVERSION;  Surgeon: [name], MD;  Location: [facility];  Service: Cardiovascular;  Laterality: N/A;   CARDIOVERSION N/A 11/25/2016   Procedure: CARDIOVERSION;  Surgeon: [name], MD;  Location: [facility];  Service: Cardiovascular;  Laterality: N/A;   CATARACT EXTRACTION W/ INTRAOCULAR LENS  IMPLANT, BILATERAL Bilateral    ELECTROPHYSIOLOGIC STUDY N/A 01/24/2016   Procedure: CARDIOVERSION;  Surgeon: [name], MD;  Location: [facility];  Service: Cardiovascular;  Laterality: N/A;   LAPAROSCOPIC CHOLECYSTECTOMY     REDUCTION MAMMAPLASTY Bilateral    TONSILLECTOMY      SOCIAL HISTORY: Social History  Substance Use Topics   Smoking status: Never Smoker   Smokeless tobacco: Never Used   Alcohol use No    FAMILY HISTORY: Family History  Problem Relation Age of Onset   Cancer Mother        fullopian tube   Dementia Mother    Pulmonary embolism Mother    Hyperlipidemia Mother    Diabetes Mother    Cancer  Father        lung   Hyperlipidemia Father    Hypertension Sister    Diabetes Brother    Stroke Paternal Grandmother    Stroke Paternal Grandfather     ROS: Review of Systems  Constitutional: Negative for weight loss.  Gastrointestinal: Positive for constipation. Negative for melena, nausea and vomiting.       Negative hematochezia  Musculoskeletal:       Negative muscle weakness    PHYSICAL EXAM: Blood pressure 116/67, pulse (!) 58, temperature 97.7 F (36.5 C), temperature source Oral, height 5\' 5"  (1.651 m), weight 181 lb (82.1 kg), SpO2 99 %. Body mass index is 30.12 kg/m. Physical Exam  Constitutional:  She is oriented to person, place, and time. She appears well-developed and well-nourished.  Cardiovascular: Normal rate.   Pulmonary/Chest: Effort normal.  Musculoskeletal: Normal range of motion.  Neurological: She is oriented to person, place, and time.  Skin: Skin is warm and dry.  Psychiatric: She has a normal mood and affect. Her behavior is normal.  Vitals reviewed.   RECENT LABS AND TESTS: BMET    Component Value Date/Time   NA 139 12/17/2016 0604   NA 139 12/02/2016 1233   K 4.1 12/17/2016 0604   CL 106 12/17/2016 0604   CO2 26 12/17/2016 0604   GLUCOSE 139 (H) 12/17/2016 0604   BUN 15 12/17/2016 0604   BUN 17 12/02/2016 1233   CREATININE 0.94 12/17/2016 0604   CREATININE 0.94 09/24/2013 1610   CALCIUM 9.4 12/17/2016 0604   GFRNONAA >60 12/17/2016 0604   GFRAA >60 12/17/2016 0604   Lab Results  Component Value Date   HGBA1C 6.4 (H) 11/20/2016   HGBA1C 6.9 09/16/2016   HGBA1C 6.9 05/23/2016   HGBA1C 6.8 02/23/2016   HGBA1C 6.8 11/24/2015   Lab Results  Component Value Date   INSULIN 6.0 11/20/2016   CBC    Component Value Date/Time   WBC 5.1 12/17/2016 0604   RBC 4.12 12/17/2016 0604   HGB 11.8 (L) 12/17/2016 0604   HGB 11.7 12/02/2016 1233   HCT 36.5 12/17/2016 0604   HCT 35.8 12/02/2016 1233   PLT 273 12/17/2016 0604   PLT 297  12/02/2016 1233   MCV 88.6 12/17/2016 0604   MCV 88 12/02/2016 1233   MCH 28.6 12/17/2016 0604   MCHC 32.3 12/17/2016 0604   RDW 14.0 12/17/2016 0604   RDW 14.6 12/02/2016 1233   LYMPHSABS 2.0 12/02/2016 1233   MONOABS 0.5 05/24/2016 1024   EOSABS 0.1 12/02/2016 1233   BASOSABS 0.0 12/02/2016 1233   Iron/TIBC/Ferritin/ %Sat    Component Value Date/Time   IRON 57 05/24/2016 1024   FERRITIN 5.3 (L) 05/24/2016 1024   IRONPCTSAT 12.1 (L) 05/24/2016 1024   Lipid Panel     Component Value Date/Time   CHOL 147 11/20/2016 0919   TRIG 57 11/20/2016 0919   HDL 47 11/20/2016 0919   CHOLHDL 3 05/10/2016 1313   VLDL 15.2 05/10/2016 1313   LDLCALC 89 11/20/2016 0919   LDLDIRECT 163.1 07/04/2011 0922   Hepatic Function Panel     Component Value Date/Time   PROT 6.9 11/20/2016 0919   ALBUMIN 4.3 11/20/2016 0919   AST 16 11/20/2016 0919   ALT 13 11/20/2016 0919   ALKPHOS 64 11/20/2016 0919   BILITOT 0.3 11/20/2016 0919   BILIDIR 0.0 07/10/2010 0822   IBILI 0.3 04/02/2010 2210      Component Value Date/Time   TSH 2.719 11/18/2016 1030   TSH 3.402 01/22/2016 1450   TSH 2.885 09/24/2013 1610    ASSESSMENT AND PLAN: Other constipation  Vitamin D deficiency  At risk for osteoporosis  Class 1 obesity with serious comorbidity and body mass index (BMI) of 30.0 to 30.9 in adult, unspecified obesity type  PLAN:  Vitamin D Deficiency Hallel was informed that low vitamin D levels contributes to fatigue and are associated with obesity, breast, and colon cancer. She agrees to continue to take prescription Vit D @50 ,000 IU every week and will follow up for routine testing of vitamin D, at least 2-3 times per year. She was informed of the risk of over-replacement of vitamin D and agrees to not increase her dose unless  he discusses this with [name] first.  At risk for osteopenia and osteoporosis Ninette is at risk for osteopenia and osteoporosis due to her vitamin D deficiency. She was  encouraged to take her vitamin D and follow her higher calcium diet and increase strengthening exercise to help strengthen her bones and decrease her risk of osteopenia and osteoporosis.  Constipation Kellsey was informed decrease bowel movement frequency is normal while losing weight, but stools should not be hard or painful. She was advised to increase her H20 intake and work on increasing her fiber intake. High fiber foods were discussed today. Biana agrees to start Miralax 17 grams daily and will follow up at the agreed upon time.  Obesity Catharina is currently in the action stage of change. As such, her goal is to continue with weight loss efforts She has agreed to keep a food journal with 1000 to 1100 calories and 85 grams of protein daily Ebunoluwa has been instructed to work up to a goal of 150 minutes of combined cardio and strengthening exercise per week for weight loss and overall health benefits. We discussed the following Behavioral Modification Strategies today: increasing lean protein intake, increase H2O intake, keep a strict food journal  Murial has agreed to follow up with our clinic in 4 weeks. She was informed of the importance of frequent follow up visits to maximize her success with intensive lifestyle modifications for her multiple health conditions.  I, Doreene Nest, am acting as transcriptionist for Lacy Duverney, PA-C  I have reviewed the above documentation for accuracy and completeness, and I agree with the above. -Lacy Duverney, PA-C  I have reviewed the above note and agree with the plan. -Dennard Nip, MD   OBESITY BEHAVIORAL INTERVENTION VISIT  Today's visit was # 4 out of 22.  Starting weight: 185 lbs Starting date: 11/20/16 Today's weight : 181 lbs Today's date: 01/02/2017 Total lbs lost to date: 4 (Patients must lose 7 lbs in the first 6 months to continue with counseling)   ASK: We discussed the diagnosis of obesity with [name] today and Jeanelle agreed to  give [name] permission to discuss obesity behavioral modification therapy today.  ASSESS: Aissa has the diagnosis of obesity and her BMI today is 30.12 Otisha is in the action stage of change   ADVISE: Latreshia was educated on the multiple health risks of obesity as well as the benefit of weight loss to improve her health. She was advised of the need for long term treatment and the importance of lifestyle modifications.  AGREE: Multiple dietary modification options and treatment options were discussed and  Saryah agreed to keep a food journal with 1000 to 1100 calories and 85 grams of protein daily We discussed the following Behavioral Modification Strategies today: increasing lean protein intake, increase H2O intake and keep a strict food journal.

## 2017-01-07 ENCOUNTER — Encounter (INDEPENDENT_AMBULATORY_CARE_PROVIDER_SITE_OTHER): Payer: Self-pay | Admitting: Family Medicine

## 2017-01-08 NOTE — Telephone Encounter (Signed)
Please address

## 2017-01-09 ENCOUNTER — Ambulatory Visit (INDEPENDENT_AMBULATORY_CARE_PROVIDER_SITE_OTHER): Payer: 59 | Admitting: Dietician

## 2017-01-09 ENCOUNTER — Encounter (INDEPENDENT_AMBULATORY_CARE_PROVIDER_SITE_OTHER): Payer: Self-pay

## 2017-01-16 ENCOUNTER — Ambulatory Visit (HOSPITAL_COMMUNITY)
Admission: RE | Admit: 2017-01-16 | Discharge: 2017-01-16 | Disposition: A | Discharge status: Home / Self Care | Payer: Commercial Managed Care - HMO | Source: Ambulatory Visit | Attending: Nurse Practitioner | Admitting: Nurse Practitioner

## 2017-01-16 ENCOUNTER — Encounter (HOSPITAL_COMMUNITY): Payer: Self-pay | Admitting: Nurse Practitioner

## 2017-01-16 VITALS — BP 136/62 | HR 55 | Ht 65.0 in | Wt 183.0 lb

## 2017-01-16 DIAGNOSIS — I481 Persistent atrial fibrillation: Secondary | ICD-10-CM | POA: Diagnosis not present

## 2017-01-16 DIAGNOSIS — E669 Obesity, unspecified: Secondary | ICD-10-CM | POA: Insufficient documentation

## 2017-01-16 DIAGNOSIS — F329 Major depressive disorder, single episode, unspecified: Secondary | ICD-10-CM | POA: Diagnosis not present

## 2017-01-16 DIAGNOSIS — E119 Type 2 diabetes mellitus without complications: Secondary | ICD-10-CM | POA: Diagnosis not present

## 2017-01-16 DIAGNOSIS — Z7901 Long term (current) use of anticoagulants: Secondary | ICD-10-CM | POA: Insufficient documentation

## 2017-01-16 DIAGNOSIS — Z794 Long term (current) use of insulin: Secondary | ICD-10-CM | POA: Diagnosis not present

## 2017-01-16 DIAGNOSIS — G4733 Obstructive sleep apnea (adult) (pediatric): Secondary | ICD-10-CM | POA: Insufficient documentation

## 2017-01-16 DIAGNOSIS — R69 Illness, unspecified: Secondary | ICD-10-CM | POA: Diagnosis not present

## 2017-01-16 DIAGNOSIS — K219 Gastro-esophageal reflux disease without esophagitis: Secondary | ICD-10-CM | POA: Diagnosis not present

## 2017-01-16 DIAGNOSIS — F419 Anxiety disorder, unspecified: Secondary | ICD-10-CM | POA: Insufficient documentation

## 2017-01-16 DIAGNOSIS — Z79899 Other long term (current) drug therapy: Secondary | ICD-10-CM | POA: Diagnosis not present

## 2017-01-16 DIAGNOSIS — I10 Essential (primary) hypertension: Secondary | ICD-10-CM | POA: Insufficient documentation

## 2017-01-16 DIAGNOSIS — K449 Diaphragmatic hernia without obstruction or gangrene: Secondary | ICD-10-CM | POA: Diagnosis not present

## 2017-01-16 DIAGNOSIS — I4819 Other persistent atrial fibrillation: Secondary | ICD-10-CM

## 2017-01-16 DIAGNOSIS — E039 Hypothyroidism, unspecified: Secondary | ICD-10-CM | POA: Diagnosis not present

## 2017-01-16 NOTE — Progress Notes (Signed)
Primary Care Physician: Jinny Sanders, MD Referring Physician: Dr. Tawni Pummel is a 71 y.o. female with a h/o afib that is here for one month f/u ablation. She reports that she is doing well, no issues  post ablation, such as swallowing issues or groin issues. She has not noted any afib since procedure. Continues on flecainide and  eliquis without missed doses.  Today, she denies symptoms of palpitations, chest pain, shortness of breath, orthopnea, PND, lower extremity edema, dizziness, presyncope, syncope, or neurologic sequela. The patient is tolerating medications without difficulties and is otherwise without complaint today.   Past Medical History:  Diagnosis Date  . Anemia   . Anxiety   . Constipation   . Depressive disorder, not elsewhere classified   . Diaphragmatic hernia without mention of obstruction or gangrene   . Disorders of bursae and tendons in shoulder region, unspecified   . Diverticulosis of colon (without mention of hemorrhage)   . GERD (gastroesophageal reflux disease)   . History of hiatal hernia   . HTN (hypertension)   . Hypothyroidism   . Internal hemorrhoids without mention of complication   . Obesity, unspecified   . OSA on CPAP    compliant with CPAP  . Other urinary incontinence   . Persistent atrial fibrillation ([place])   . Pure hypercholesterolemia   . Type II diabetes mellitus ([place])   . Urinary frequency   . Urinary urgency   . Vitamin B 12 deficiency    Past Surgical History:  Procedure Laterality Date  . ABDOMINAL HYSTERECTOMY  1972   Have both ovaries  . ATRIAL FIBRILLATION ABLATION  12/17/2016  . ATRIAL FIBRILLATION ABLATION N/A 12/17/2016   Procedure: ATRIAL FIBRILLATION ABLATION;  Surgeon: Thompson Grayer, MD;  Location: [place] [facility];  Service: Cardiovascular;  Laterality: N/A;  . [place]   [facility]; "Dr. [name]"  . CARDIOVERSION N/A 05/27/2016   Procedure: CARDIOVERSION;  Surgeon: [name],  MD;  Location: [facility];  Service: Cardiovascular;  Laterality: N/A;  . CARDIOVERSION N/A 11/25/2016   Procedure: CARDIOVERSION;  Surgeon: [name], MD;  Location: [facility];  Service: Cardiovascular;  Laterality: N/A;  . CATARACT EXTRACTION W/ INTRAOCULAR LENS  IMPLANT, BILATERAL Bilateral   . ELECTROPHYSIOLOGIC STUDY N/A 01/24/2016   Procedure: CARDIOVERSION;  Surgeon: [name], MD;  Location: [facility];  Service: Cardiovascular;  Laterality: N/A;  . LAPAROSCOPIC CHOLECYSTECTOMY    . REDUCTION MAMMAPLASTY Bilateral   . TONSILLECTOMY      Current Outpatient Medications  Medication Sig Dispense Refill  . atorvastatin (LIPITOR) 10 MG tablet TAKE ONE TABLET BY MOUTH EVERY NIGHT AT BEDTIME 90 tablet 3  . Calcium Carb-Ergocalciferol 500-200 MG-UNIT TABS Take 1 tablet by mouth daily.     [name] diltiazem (CARDIZEM CD) 180 MG 24 hr capsule TAKE 1 CAPSULE BY MOUTH EVERY DAY (NEED TO SCHEDULE APPT [phone]) 30 capsule 2  . ELIQUIS 5 MG TABS tablet TAKE 1 TABLET BY MOUTH TWICE A DAY 60 tablet 3  . escitalopram (LEXAPRO) 10 MG tablet TAKE ONE TABLET BY MOUTH ONCE DAILY 90 tablet 1  . ferrous sulfate 325 (65 FE) MG tablet Take 325 mg by mouth every other day.    . flecainide (TAMBOCOR) 100 MG tablet TAKE 1 TABLET (100 MG TOTAL) BY MOUTH 2 (TWO) TIMES DAILY. 180 tablet 0  . glucose blood (ONE TOUCH ULTRA TEST) test strip CHECK BLOOD SUGAR 2 TIMES A DAY AS DIRECTED 200 each  3  . Insulin Glargine (LANTUS SOLOSTAR) 100 UNIT/ML Solostar Pen Using as instructed    . linagliptin (TRADJENTA) 5 MG TABS tablet Take 1 tablet (5 mg total) by mouth daily. 30 tablet 11  . metFORMIN (GLUCOPHAGE-XR) 500 MG 24 hr tablet Take 4 tablets (2,000 mg total) by mouth daily with supper. (Patient taking differently: Take 1,000 mg by mouth 2 (two) times daily. ) 360 tablet 3  . metFORMIN (GLUCOPHAGE-XR) 500 MG 24 hr tablet TAKE 4 TABLETS BY MOUTH IN THE EVENING WITH DINNER (Patient taking differently: 2 tablets in  the morning, 2 tablets in the evening) 360 tablet 2  . Multiple Vitamin (MULTIVITAMIN) tablet Take 1 tablet by mouth daily.      . ONE TOUCH ULTRA TEST test strip USE TO CHECK BLOOD SUGAR 2 TIMES DAILY AS DIRECTED 200 each 2  . polyethylene glycol powder (GLYCOLAX/MIRALAX) powder Take 17 g by mouth daily. 3350 g 0  . sennosides-docusate sodium (SENOKOT-S) 8.6-50 MG tablet Take 1 tablet by mouth daily as needed for constipation.    . traZODone (DESYREL) 50 MG tablet Take 1-2 tablets (50-100 mg total) by mouth at bedtime as needed for sleep. (Patient taking differently: Take 50 mg by mouth at bedtime. ) 60 tablet 5  . Vitamin D, Ergocalciferol, (DRISDOL) 50000 units CAPS capsule Take 1 capsule (50,000 Units total) by mouth every 7 (seven) days. 4 capsule 0  . oxybutynin (DITROPAN-XL) 5 MG 24 hr tablet TAKE ONE TABLET BY MOUTH EVERY NIGHT AT BEDTIME (Patient not taking: Reported on 01/16/2017) 30 tablet 11   No current facility-administered medications for this encounter.     Allergies  Allergen Reactions  . Pravastatin Sodium Other (See Comments)    Leg pain  . Contrast Media [Iodinated Diagnostic Agents] Rash  . Cortisone Hives and Rash    Social History   Socioeconomic History  . Marital status: Married    Spouse name: Laverna Peace  . Number of children: 2  . Years of education: Not on file  . Highest education level: Not on file  Social Needs  . Financial resource strain: Not on file  . Food insecurity - worry: Not on file  . Food insecurity - inability: Not on file  . Transportation needs - medical: Not on file  . Transportation needs - non-medical: Not on file  Occupational History  . Occupation: Stay at home    Employer: UNEMPLOYED  Tobacco Use  . Smoking status: Never Smoker  . Smokeless tobacco: Never Used  Substance and Sexual Activity  . Alcohol use: No    Alcohol/week: 0.0 oz  . Drug use: No  . Sexual activity: Yes  Other Topics Concern  . Not on file  Social History  Narrative   Exercise walks 3 times daily   Lives with spouse in [place]   homemaker      Diet fruit, occ fast food, salads and lean meat    Family History  Problem Relation Age of Onset  . Cancer Mother        fullopian tube  . Dementia Mother   . Pulmonary embolism Mother   . Hyperlipidemia Mother   . Diabetes Mother   . Cancer Father        lung  . Hyperlipidemia Father   . Hypertension Sister   . Diabetes Brother   . Stroke Paternal Grandmother   . Stroke Paternal Grandfather     ROS- All systems are reviewed and negative except as per the HPI above  Physical Exam: Vitals:   01/16/17 0909  BP: 136/62  Pulse: (!) 55  Weight: 183 lb (83 kg)  Height: 5\' 5"  (1.651 m)   Wt Readings from Last 3 Encounters:  01/16/17 183 lb (83 kg)  01/02/17 181 lb (82.1 kg)  12/19/16 180 lb (81.6 kg)    Labs: Lab Results  Component Value Date   NA 139 12/17/2016   K 4.1 12/17/2016   CL 106 12/17/2016   CO2 26 12/17/2016   GLUCOSE 139 (H) 12/17/2016   BUN 15 12/17/2016   CREATININE 0.94 12/17/2016   CALCIUM 9.4 12/17/2016   No results found for: INR Lab Results  Component Value Date   CHOL 147 11/20/2016   HDL 47 11/20/2016   LDLCALC 89 11/20/2016   TRIG 57 11/20/2016     GEN- The patient is well appearing, alert and oriented x 3 today.   Head- normocephalic, atraumatic Eyes-  Sclera clear, conjunctiva pink Ears- hearing intact Oropharynx- clear Neck- supple, no JVP Lymph- no cervical lymphadenopathy Lungs- Clear to ausculation bilaterally, normal work of breathing Heart- Regular rate and rhythm, no murmurs, rubs or gallops, PMI not laterally displaced GI- soft, NT, ND, + BS Extremities- no clubbing, cyanosis, or edema MS- no significant deformity or atrophy Skin- no rash or lesion Psych- euthymic mood, full affect Neuro- strength and sensation are intact  EKG- Sinus brady at 55 bpm,IRBBB, pr int 186 ms, qrs int 94 ms, qtc 420 ms Epic records  reviewed    Assessment and Plan: 1. Afib S/p post ablation x one month  Doing well, staying in SR Continue flecainide 100 mg bid Continue cardizem 180 mg qd Continue eliquis 5 mg bid for chadsvasc score of at least 4, aware of not to stop drug for any reason in the healing period  F/u with Dr. Rayann Heman 03/19/16  Geroge Baseman. [place], [facility] [address] [place], [place] [address] [phone]

## 2017-01-20 ENCOUNTER — Other Ambulatory Visit: Payer: Self-pay | Admitting: Cardiovascular Disease

## 2017-01-20 NOTE — Telephone Encounter (Signed)
Refill Request.  

## 2017-01-24 ENCOUNTER — Other Ambulatory Visit: Payer: Self-pay | Admitting: Family Medicine

## 2017-01-30 ENCOUNTER — Ambulatory Visit (INDEPENDENT_AMBULATORY_CARE_PROVIDER_SITE_OTHER): Payer: 59 | Admitting: Physician Assistant

## 2017-01-31 ENCOUNTER — Other Ambulatory Visit: Payer: Self-pay | Admitting: Family Medicine

## 2017-02-09 ENCOUNTER — Other Ambulatory Visit: Payer: Self-pay | Admitting: Cardiovascular Disease

## 2017-02-11 ENCOUNTER — Other Ambulatory Visit: Payer: Self-pay | Admitting: Internal Medicine

## 2017-02-26 DIAGNOSIS — D649 Anemia, unspecified: Secondary | ICD-10-CM | POA: Insufficient documentation

## 2017-02-26 DIAGNOSIS — N3941 Urge incontinence: Secondary | ICD-10-CM | POA: Insufficient documentation

## 2017-02-26 DIAGNOSIS — I10 Essential (primary) hypertension: Secondary | ICD-10-CM

## 2017-02-26 DIAGNOSIS — E538 Deficiency of other specified B group vitamins: Secondary | ICD-10-CM | POA: Insufficient documentation

## 2017-02-26 DIAGNOSIS — G4733 Obstructive sleep apnea (adult) (pediatric): Secondary | ICD-10-CM | POA: Insufficient documentation

## 2017-02-26 DIAGNOSIS — R350 Frequency of micturition: Secondary | ICD-10-CM | POA: Insufficient documentation

## 2017-02-26 DIAGNOSIS — K219 Gastro-esophageal reflux disease without esophagitis: Secondary | ICD-10-CM | POA: Insufficient documentation

## 2017-02-26 DIAGNOSIS — Z9989 Dependence on other enabling machines and devices: Secondary | ICD-10-CM | POA: Insufficient documentation

## 2017-02-26 DIAGNOSIS — Z8719 Personal history of other diseases of the digestive system: Secondary | ICD-10-CM | POA: Insufficient documentation

## 2017-02-26 DIAGNOSIS — F419 Anxiety disorder, unspecified: Secondary | ICD-10-CM | POA: Insufficient documentation

## 2017-03-03 ENCOUNTER — Other Ambulatory Visit: Payer: Self-pay | Admitting: Cardiovascular Disease

## 2017-03-10 ENCOUNTER — Encounter: Payer: Self-pay | Admitting: Pulmonary Disease

## 2017-03-10 ENCOUNTER — Ambulatory Visit (INDEPENDENT_AMBULATORY_CARE_PROVIDER_SITE_OTHER): Payer: 59 | Admitting: Pulmonary Disease

## 2017-03-10 VITALS — Ht 65.5 in | Wt 179.0 lb

## 2017-03-10 DIAGNOSIS — G4733 Obstructive sleep apnea (adult) (pediatric): Secondary | ICD-10-CM | POA: Diagnosis not present

## 2017-03-10 NOTE — Progress Notes (Signed)
PULMONARY OFFICE FOLLOW UP NOTE  Requesting MD/Service: Fletcher Anon Date of initial consultation: 02/09/16 Reason for consultation: OSA  PT PROFILE: 72 y.o. F never smoker with history of OSA diagnosed 09/2008 referred by Dr Fletcher Anon for evaluation of persistent daytime hypersomnolence  DATA: Sleep study 09/14/08 [facility] ([place] AHI 32/hr with predominance of hypopneas. SpO2 nadir 86%. Echocardiogram 10/08/13: Normal LVEF and diastolic function, Mild MVP, moderate MR, trivial TR,  RVSP not estimated  Echocardiogram 01/22/16: Atrial fibirllation, LVEF 55-60%, mild MR, LA moderately dilated, mild to moderate TR, RVSP estimate 47 mmHg Echocardiogram 06/27/16: LVEF 55-60%. LA moderately dilated, RA mildly dilated. RVSP est. Normal CPAP compliance:  usage 30/30 nights, greater than 4 hours 18/30 nights.  INTERVAL: Underwent ablation for chronic atrial fibrillation  SUBJ:  This is a routine reevaluation. She is using the CPAP each night and tolerating it reasonably well, though sometimes, she does not put it back on after waking to use the bathroom.  Overall, she feels better rested when she awakens and has much improved daytime hypersomnolence. She has no new complaints.   Vitals:   03/10/17 1007  Weight: 81.2 kg (179 lb)  Height: 5' 5.5" (1.664 m)    EXAM:  Gen: NAD HEENT: NCAT, sclerae white, oropharynx normal Neck: No LAN, no JVD noted Lungs: full BS, no wheezes Cardiovascular: Bradycardia, reg, no M noted Abdomen: Soft, NT, +BS Ext: no C/C/E Neuro: Grossly intact   DATA:   BMP Latest Ref Rng & Units 12/17/2016 12/02/2016 11/20/2016  Glucose 65 - 99 mg/dL 139(H) 80 122(H)  BUN 6 - 20 mg/dL 15 17 20   Creatinine 0.44 - 1.00 mg/dL 0.94 1.04(H) 0.96  BUN/Creat Ratio 12 - 28 - 16 21  Sodium 135 - 145 mmol/L 139 139 139  Potassium 3.5 - 5.1 mmol/L 4.1 4.9 4.5  Chloride 101 - 111 mmol/L 106 100 99  CO2 22 - 32 mmol/L 26 25 23   Calcium 8.9 - 10.3 mg/dL 9.4 9.9 9.5    CBC Latest Ref Rng &  Units 12/17/2016 12/02/2016 11/18/2016  WBC 4.0 - 10.5 K/uL 5.1 7.2 5.6  Hemoglobin 12.0 - 15.0 g/dL 11.8(L) 11.7 11.3(L)  Hematocrit 36.0 - 46.0 % 36.5 35.8 35.4(L)  Platelets 150 - 400 K/uL 273 297 291    CXR: no new film  IMPRESSION:     ICD-10-CM   1. OSA (obstructive sleep apnea) G47.33   2) asymptomatic bradycardia, (HR 53/min)  Sleep apnea as well controlled with CPAP.    PLAN:  Continue CPAP She is to discuss relative bradycardia with her cardiologist Follow-up in 1 year or sooner as needed for any sleep or respiratory problems   Merton Border, MD PCCM service Mobile [phone] Pager [phone] 03/13/2017

## 2017-03-10 NOTE — Patient Instructions (Signed)
Continue CPAP Your heart rate today was 53/min.  This is a reminder to discuss this with Dr. Rayann Heman when you follow-up with him Follow-up in this office in 1 year or sooner as needed for any sleep or breathing problems

## 2017-03-19 ENCOUNTER — Ambulatory Visit (INDEPENDENT_AMBULATORY_CARE_PROVIDER_SITE_OTHER): Payer: 59 | Admitting: Internal Medicine

## 2017-03-19 ENCOUNTER — Encounter: Payer: Self-pay | Admitting: Internal Medicine

## 2017-03-19 VITALS — BP 159/69 | HR 57 | Ht 65.5 in | Wt 183.0 lb

## 2017-03-19 DIAGNOSIS — I481 Persistent atrial fibrillation: Secondary | ICD-10-CM | POA: Diagnosis not present

## 2017-03-19 DIAGNOSIS — G4733 Obstructive sleep apnea (adult) (pediatric): Secondary | ICD-10-CM | POA: Diagnosis not present

## 2017-03-19 DIAGNOSIS — I10 Essential (primary) hypertension: Secondary | ICD-10-CM | POA: Diagnosis not present

## 2017-03-19 DIAGNOSIS — I4819 Other persistent atrial fibrillation: Secondary | ICD-10-CM

## 2017-03-19 MED ORDER — DILTIAZEM HCL ER COATED BEADS 120 MG PO CP24: 120.0000 mg | ORAL_CAPSULE | Freq: Every day | ORAL | 3 refills | Status: DC

## 2017-03-19 NOTE — Progress Notes (Signed)
PCP: Jinny Sanders, MD Primary Cardiologist: [name] is a 72 y.o. female who presents today for routine electrophysiology followup.  Since his recent afib ablation, the patient reports doing very well.  [name] denies procedure related complications and is pleased with the results of the procedure.  Today, [name] denies symptoms of palpitations, chest pain, shortness of breath,  lower extremity edema, dizziness, presyncope, or syncope.  The patient is otherwise without complaint today.   Past Medical History:  Diagnosis Date  . Anemia   . Anxiety   . Constipation   . Depressive disorder, not elsewhere classified   . Diaphragmatic hernia without mention of obstruction or gangrene   . Disorders of bursae and tendons in shoulder region, unspecified   . Diverticulosis of colon (without mention of hemorrhage)   . GERD (gastroesophageal reflux disease)   . History of hiatal hernia   . HTN (hypertension)   . Hypothyroidism   . Internal hemorrhoids without mention of complication   . Obesity, unspecified   . OSA on CPAP    compliant with CPAP  . Other urinary incontinence   . Persistent atrial fibrillation ([place])   . Pure hypercholesterolemia   . Type II diabetes mellitus ([place])   . Urinary frequency   . Urinary urgency   . Vitamin B 12 deficiency    Past Surgical History:  Procedure Laterality Date  . ABDOMINAL HYSTERECTOMY  1972   Have both ovaries  . ATRIAL FIBRILLATION ABLATION  12/17/2016  . ATRIAL FIBRILLATION ABLATION N/A 12/17/2016   Procedure: ATRIAL FIBRILLATION ABLATION;  Surgeon: Thompson Grayer, MD;  Location: [facility];  Service: Cardiovascular;  Laterality: N/A;  . [place]   MC; "Emily. [name]"  . CARDIOVERSION N/A 05/27/2016   Procedure: CARDIOVERSION;  Surgeon: [name], MD;  Location: [facility];  Service: Cardiovascular;  Laterality: N/A;  . CARDIOVERSION N/A 11/25/2016   Procedure: CARDIOVERSION;  Surgeon: [name], MD;  Location: [facility];  Service: Cardiovascular;  Laterality: N/A;  . CATARACT EXTRACTION W/ INTRAOCULAR LENS  IMPLANT, BILATERAL Bilateral   . ELECTROPHYSIOLOGIC STUDY N/A 01/24/2016   Procedure: CARDIOVERSION;  Surgeon: [name], MD;  Location: [facility];  Service: Cardiovascular;  Laterality: N/A;  . LAPAROSCOPIC CHOLECYSTECTOMY    . REDUCTION MAMMAPLASTY Bilateral   . TONSILLECTOMY      ROS- all systems are personally reviewed and negatives except as per HPI above  Current Outpatient Medications  Medication Sig Dispense Refill  . atorvastatin (LIPITOR) 10 MG tablet TAKE ONE TABLET BY MOUTH EVERY NIGHT AT BEDTIME 90 tablet 3  . Calcium Carb-Ergocalciferol 500-200 MG-UNIT TABS Take 1 tablet by mouth daily.     [name] diltiazem (CARDIZEM CD) 180 MG 24 hr capsule TAKE 1 CAPSULE BY MOUTH EVERY DAY (NEED TO SCHEDULE APPT [phone]) (Patient taking differently: TAKE 1 CAPSULE BY MOUTH EVERY DAY) 30 capsule 0  . ELIQUIS 5 MG TABS tablet TAKE 1 TABLET BY MOUTH TWICE A DAY 60 tablet 3  . escitalopram (LEXAPRO) 10 MG tablet TAKE ONE TABLET BY MOUTH ONCE DAILY 90 tablet 0  . ferrous sulfate 325 (65 FE) MG tablet Take 325 mg by mouth every other day.    . flecainide (TAMBOCOR) 100 MG tablet Take 1 tablet (100 mg total) by mouth 2 (two) times daily. 180 tablet 3  . glucose blood (ONE TOUCH ULTRA TEST) test strip CHECK BLOOD SUGAR 2 TIMES A DAY AS DIRECTED 200 each 3  .  Insulin Glargine (LANTUS SOLOSTAR) 100 UNIT/ML Solostar Pen Using as instructed    . metFORMIN (GLUCOPHAGE-XR) 500 MG 24 hr tablet TAKE 4 TABLETS BY MOUTH IN THE EVENING WITH DINNER (Patient taking differently: 2 tablets in the morning, 2 tablets in the evening) 360 tablet 2  . Multiple Vitamin (MULTIVITAMIN) tablet Take 1 tablet by mouth daily.      . polyethylene glycol powder (GLYCOLAX/MIRALAX) powder Take 17 g by mouth daily. 3350 g 0  . sennosides-docusate sodium (SENOKOT-S) 8.6-50 MG tablet Take 1 tablet by  mouth daily as needed for constipation.    . TRADJENTA 5 MG TABS tablet TAKE 1 TABLET (5 MG TOTAL) BY MOUTH DAILY. 30 tablet 1  . traZODone (DESYREL) 50 MG tablet Take 1-2 tablets (50-100 mg total) by mouth at bedtime as needed for sleep. (Patient taking differently: Take 50 mg by mouth at bedtime. ) 60 tablet 5   No current facility-administered medications for this visit.     Physical Exam: Vitals:   03/19/17 1103  BP: (!) 159/69  Pulse: (!) 57  Weight: 183 lb (83 kg)  Height: 5' 5.5" (1.664 m)    GEN- The patient is well appearing, alert and oriented x 3 today.   Head- normocephalic, atraumatic Eyes-  Sclera clear, conjunctiva pink Ears- hearing intact Oropharynx- clear Lungs- Clear to ausculation bilaterally, normal work of breathing Heart- Regular rate and rhythm, no murmurs, rubs or gallops, PMI not laterally displaced GI- soft, NT, ND, + BS Extremities- no clubbing, cyanosis, or edema  EKG tracing ordered today is personally reviewed and shows sinus rhythm with premature atrial contractions, V rate 57 bpm, PR 194 msec, QRS 102 msec, incomplete RBBB, LVH  Assessment and Plan:  1. Persistent atrial fibrillation Doing well s/p ablation chads2vasc score is 4.  Continue eliquis Reduce diltiazem CD to 120mg  daily [name] is not ready to reduce flecainide.  I have told her that it would be ok to reduce flecainide to 50mg  BID at least.  [name] will think about this.  2. HTN Stable [name] reports good BP control at home No change required today  3. Mild to moderate MR Will need to follow clinically with echo  3. OSA Compliance with CPAP encouraged  4. Obesity Body mass index is 29.99 kg/m. Lifestyle modification encouraged  Return to see me in 3 months  Thompson Grayer MD, [facility] 03/19/2017 11:35 AM

## 2017-03-19 NOTE — Patient Instructions (Addendum)
Medication Instructions:  Your physician has recommended you make the following change in your medication:  1) Decrease Diltiazem to 120 mg daily    Labwork: None ordered   Testing/Procedures: None ordered   Follow-Up: Your physician recommends that you schedule a follow-up appointment in: 3 months with Dr Rayann Heman   Any Other Special Instructions Will Be Listed Below (If Applicable).     If you need a refill on your cardiac medications before your next appointment, please call your pharmacy.

## 2017-03-21 ENCOUNTER — Ambulatory Visit: Payer: 59 | Admitting: Internal Medicine

## 2017-03-27 ENCOUNTER — Encounter: Payer: Self-pay | Admitting: Internal Medicine

## 2017-03-27 ENCOUNTER — Ambulatory Visit (INDEPENDENT_AMBULATORY_CARE_PROVIDER_SITE_OTHER): Payer: 59 | Admitting: Internal Medicine

## 2017-03-27 VITALS — BP 138/62 | HR 64 | Temp 97.9°F | Resp 18 | Ht 65.5 in | Wt 179.8 lb

## 2017-03-27 DIAGNOSIS — E119 Type 2 diabetes mellitus without complications: Secondary | ICD-10-CM | POA: Diagnosis not present

## 2017-03-27 DIAGNOSIS — E7800 Pure hypercholesterolemia, unspecified: Secondary | ICD-10-CM

## 2017-03-27 DIAGNOSIS — Z794 Long term (current) use of insulin: Secondary | ICD-10-CM

## 2017-03-27 DIAGNOSIS — E663 Overweight: Secondary | ICD-10-CM

## 2017-03-27 LAB — POCT GLYCOSYLATED HEMOGLOBIN (HGB A1C): HEMOGLOBIN A1C: 6.7

## 2017-03-27 MED ORDER — GLUCOSE BLOOD VI STRP: ORAL_STRIP | 3 refills | Status: DC

## 2017-03-27 NOTE — Patient Instructions (Signed)
Please continue: - Metformin ER 1000 mg 2x a day (try 2000 mg with dinner) - Tradjenta 5 mg before lunch  You can stay off [place].  Please come back for a follow-up appointment in 4 months.

## 2017-03-27 NOTE — Progress Notes (Signed)
Patient ID: [name], female   DOB: 11/10/1945, 72 y.o.   MRN: [id number]  HPI: [name] is a 72 y.o.-year-old female, returning for f/u for DM2, dx 1995, insulin-dependent since 2014 - now off insulin since 12/2016, with improved control, without long term complications. Last visit 3 months ago.  She is working with Dr. Dennard Nip in the weight loss center: Before last visit, she lost 18 pounds since 09/2016.  She gained 5 pounds since our last visit during the holidays.  Last hemoglobin A1c was: Lab Results  Component Value Date   HGBA1C 6.4 (H) 11/20/2016   HGBA1C 6.9 09/16/2016   HGBA1C 6.9 05/23/2016   Pt is on a regimen of: - Metformin XR 1000 mg 2x a day - Januvia 100 mg daily in am >> Tradjenta 5 mg daily before lunch She was on Basaglar 12 >> 14 units in hs >> stopped at last visit - but still takes it seldom (10-12 units)  Pt checks her sugars 2x a day: - am: 90-120, 150 >> 90-120s >> 90-120 >> 80-124 >> 100-120, 134 - 2h after b'fast: 90-103 >> 113-141 >> n/c - before lunch:90-130 >> 100-125, 199 (forgot meds) >> n/c - 2h after lunch:  160-180 >> 140-160s >> n/c - before dinner: 211, 209 >> n/c >> 102-144 >> n/c - 2h after dinner:  180s, 289 >> n/c >> 93-170, 191, 198 >> 130-150, 180, 200 - bedtime:  n/c >> 120-160 >> 140-160 >> n/c - nighttime: n/c Lowest sugar was 90 >> 79 >> 100; she has hypoglycemia awareness in the 80s. Highest sugar was 199 (forgot med) >> 200 >> 200.  Pt's meals are: - Breakfast: cereals + 2% milk; bread + cheese; bread + PB - Lunch: sandwich; soup - Dinner: chicken; steak; seafood - Snacks: 2: PB crackers  - No CKD, last BUN/creatinine:  Lab Results  Component Value Date   BUN 15 12/17/2016   CREATININE 0.94 12/17/2016   -+ HL; last set of lipids: Lab Results  Component Value Date   CHOL 147 11/20/2016   HDL 47 11/20/2016   LDLCALC 89 11/20/2016   LDLDIRECT 163.1 07/04/2011   TRIG 57 11/20/2016   CHOLHDL 3 05/10/2016   On Lipitor.   - last eye exam was in 05/2016: No DR- [facility].  She had cataract surgery in 04 and 07/2016. - No numbness and tingling in her feet.  She has Afib >> had repeated cardioversion episodes. On Flecainide, Diltiazem, Eliquis.  ROS: Constitutional: + weight loss, no fatigue, no subjective hyperthermia, no subjective hypothermia Eyes: no blurry vision, no xerophthalmia ENT: no sore throat, no nodules palpated in throat, no dysphagia, no odynophagia, no hoarseness Cardiovascular: no CP/no SOB/no palpitations/no leg swelling Respiratory: no cough/no SOB/no wheezing Gastrointestinal: no N/no V/no D/no C/no acid reflux Musculoskeletal: no muscle aches/no joint aches Skin: no rashes, no hair loss Neurological: no tremors/no numbness/no tingling/no dizziness  I reviewed pt's medications, allergies, PMH, social hx, family hx, and changes were documented in the history of present illness. Otherwise, unchanged from my initial visit note.  PE: BP 138/62 (BP Location: Left Arm, Patient Position: Sitting, Cuff Size: Normal)   Pulse 64   Temp 97.9 F (36.6 C) (Oral)   Resp 18   Ht 5' 5.5" (1.664 m)   Wt 179 lb 12.8 oz (81.6 kg)   LMP  (LMP Unknown)   SpO2 96%   BMI 29.47 kg/m  Body mass index is 29.47 kg/m. Wt Readings from  Last 3 Encounters:  03/27/17 179 lb 12.8 oz (81.6 kg)  03/19/17 183 lb (83 kg)  03/10/17 179 lb (81.2 kg)   Constitutional: overweight, in NAD Eyes: PERRLA, EOMI, no exophthalmos ENT: moist mucous membranes, no thyromegaly, no cervical lymphadenopathy Cardiovascular: RRR, No MRG Respiratory: CTA B Gastrointestinal: abdomen soft, NT, ND, BS+ Musculoskeletal: no deformities, strength intact in all 4 Skin: moist, warm, no rashes Neurological: no tremor with outstretched hands, DTR normal in all 4  ASSESSMENT: 1. DM2, previously insulin-dependent, controlled, without long term complications, but with hyperglycemia - she was contemplating gastric  sleeve sx >> on hold now  2. Overweight  3. Hyperlipidemia  PLAN:  1. Patient with long-standing, now controlled type 2 diabetes, initially insulin-dependent, now off insulin since last visit (used it few times since last visit during the Holidays),  after she started to adjust her diet and lost weight.  She is currently only on metformin and Tradjenta.  - sugars are a little higher especially after dinner - no changes needed in her regimen as she already started to adjust her diet and lose weight >> she can stay off insulin for now - Reviewed latest HbA1c from last visit: 6.2% (great!) - I suggested to:   Patient Instructions  Please continue: - Metformin ER 1000 mg 2x a day (try 2000 mg with dinner) - Tradjenta 5 mg before lunch  You can stay off [place].  Please come back for a follow-up appointment in 4 months.  - today, HbA1c is 6.7% (slightly higher) - continue checking sugars at different times of the day - check 1-2x a day, rotating checks - advised for yearly eye exams >> she is UTD - Return to clinic in 4 mo with sugar log    2. Overweight - Continues to work with Dr. Dennard Nip in the weight loss center and she is doing great.  Before last visit, she lost 18 pounds.  Since last visit, she gained 5 pounds, due to the holidays, then lost 4 lbs. She is determined to get back on track with her diet.  3.  Hyperlipidemia - Reviewed latest lipid panel from 11/2016: LDL much decreased, now at goal - Continues Lipitor without side effects  [name], MD PhD [facility] Endocrinology

## 2017-04-02 ENCOUNTER — Other Ambulatory Visit: Payer: Self-pay | Admitting: Cardiovascular Disease

## 2017-04-02 NOTE — Telephone Encounter (Signed)
Please review for refill. Dr. Rayann Heman original prescribed  The Diltiazem. Thanks!

## 2017-04-08 DIAGNOSIS — G4733 Obstructive sleep apnea (adult) (pediatric): Secondary | ICD-10-CM | POA: Diagnosis not present

## 2017-04-08 DIAGNOSIS — G4730 Sleep apnea, unspecified: Secondary | ICD-10-CM | POA: Diagnosis not present

## 2017-04-09 ENCOUNTER — Other Ambulatory Visit: Payer: Self-pay | Admitting: Internal Medicine

## 2017-04-23 ENCOUNTER — Telehealth: Payer: Self-pay | Admitting: Internal Medicine

## 2017-04-23 DIAGNOSIS — R69 Illness, unspecified: Secondary | ICD-10-CM | POA: Diagnosis not present

## 2017-04-23 MED ORDER — ONETOUCH ULTRA 2 W/DEVICE KIT: 1.0000 | PACK | Freq: Once | 0 refills | Status: AC

## 2017-04-23 MED ORDER — GLUCOSE BLOOD VI STRP: ORAL_STRIP | 3 refills | Status: DC

## 2017-04-23 NOTE — Telephone Encounter (Signed)
sent 

## 2017-04-23 NOTE — Telephone Encounter (Signed)
glucose blood (ONE TOUCH ULTRA TEST) test strip    One touch ultra meter sent in as well.    [facility] Diagnoses code so they can fill prescription     [facility]  [address] [place], [place] [phone]

## 2017-04-24 ENCOUNTER — Other Ambulatory Visit: Payer: Self-pay

## 2017-04-24 MED ORDER — GLUCOSE BLOOD VI STRP: ORAL_STRIP | 3 refills | Status: DC

## 2017-04-25 ENCOUNTER — Other Ambulatory Visit: Payer: Self-pay | Admitting: *Deleted

## 2017-04-25 DIAGNOSIS — R69 Illness, unspecified: Secondary | ICD-10-CM | POA: Diagnosis not present

## 2017-04-25 MED ORDER — GLUCOSE BLOOD VI STRP: ORAL_STRIP | 3 refills | Status: DC

## 2017-05-08 DIAGNOSIS — E119 Type 2 diabetes mellitus without complications: Secondary | ICD-10-CM | POA: Diagnosis not present

## 2017-05-08 DIAGNOSIS — H524 Presbyopia: Secondary | ICD-10-CM | POA: Diagnosis not present

## 2017-05-19 ENCOUNTER — Other Ambulatory Visit: Payer: Self-pay | Admitting: Family Medicine

## 2017-05-20 ENCOUNTER — Other Ambulatory Visit: Payer: Self-pay | Admitting: Cardiovascular Disease

## 2017-05-20 NOTE — Telephone Encounter (Signed)
Please review for refill, Thanks !  

## 2017-05-26 ENCOUNTER — Ambulatory Visit (INDEPENDENT_AMBULATORY_CARE_PROVIDER_SITE_OTHER): Payer: Medicare HMO

## 2017-05-26 ENCOUNTER — Ambulatory Visit: Payer: Medicare HMO

## 2017-05-26 VITALS — BP 112/60 | HR 68 | Temp 98.0°F | Ht 64.75 in | Wt 178.5 lb

## 2017-05-26 DIAGNOSIS — R7309 Other abnormal glucose: Secondary | ICD-10-CM

## 2017-05-26 DIAGNOSIS — D508 Other iron deficiency anemias: Secondary | ICD-10-CM | POA: Diagnosis not present

## 2017-05-26 DIAGNOSIS — E538 Deficiency of other specified B group vitamins: Secondary | ICD-10-CM

## 2017-05-26 DIAGNOSIS — Z0000 Encounter for general adult medical examination without abnormal findings: Secondary | ICD-10-CM

## 2017-05-26 DIAGNOSIS — D649 Anemia, unspecified: Secondary | ICD-10-CM

## 2017-05-26 DIAGNOSIS — Z23 Encounter for immunization: Secondary | ICD-10-CM | POA: Diagnosis not present

## 2017-05-26 DIAGNOSIS — E7800 Pure hypercholesterolemia, unspecified: Secondary | ICD-10-CM | POA: Diagnosis not present

## 2017-05-26 DIAGNOSIS — E559 Vitamin D deficiency, unspecified: Secondary | ICD-10-CM | POA: Diagnosis not present

## 2017-05-26 LAB — CBC WITH DIFFERENTIAL/PLATELET
BASOS PCT: 0.4 % (ref 0.0–3.0)
Basophils Absolute: 0 10*3/uL (ref 0.0–0.1)
EOS ABS: 0.1 10*3/uL (ref 0.0–0.7)
EOS PCT: 1.2 % (ref 0.0–5.0)
HEMATOCRIT: 34 % — AB (ref 36.0–46.0)
HEMOGLOBIN: 11.3 g/dL — AB (ref 12.0–15.0)
Lymphocytes Relative: 22.8 % (ref 12.0–46.0)
Lymphs Abs: 1.6 10*3/uL (ref 0.7–4.0)
MCHC: 33.2 g/dL (ref 30.0–36.0)
MCV: 86.6 fl (ref 78.0–100.0)
Monocytes Absolute: 0.3 10*3/uL (ref 0.1–1.0)
Monocytes Relative: 4.7 % (ref 3.0–12.0)
Neutro Abs: 4.8 10*3/uL (ref 1.4–7.7)
Neutrophils Relative %: 70.9 % (ref 43.0–77.0)
Platelets: 290 10*3/uL (ref 150.0–400.0)
RBC: 3.92 Mil/uL (ref 3.87–5.11)
RDW: 14.5 % (ref 11.5–15.5)
WBC: 6.8 10*3/uL (ref 4.0–10.5)

## 2017-05-26 LAB — IBC PANEL
Iron: 43 ug/dL (ref 42–145)
SATURATION RATIOS: 10 % — AB (ref 20.0–50.0)
Transferrin: 306 mg/dL (ref 212.0–360.0)

## 2017-05-26 LAB — COMPREHENSIVE METABOLIC PANEL
ALT: 12 U/L (ref 0–35)
AST: 13 U/L (ref 0–37)
Albumin: 3.9 g/dL (ref 3.5–5.2)
Alkaline Phosphatase: 55 U/L (ref 39–117)
BUN: 14 mg/dL (ref 6–23)
CHLORIDE: 102 meq/L (ref 96–112)
CO2: 29 mEq/L (ref 19–32)
Calcium: 10 mg/dL (ref 8.4–10.5)
Creatinine, Ser: 0.88 mg/dL (ref 0.40–1.20)
GFR: 67.48 mL/min (ref >60.00)
GLUCOSE: 120 mg/dL — AB (ref 70–99)
POTASSIUM: 4.8 meq/L (ref 3.5–5.1)
SODIUM: 138 meq/L (ref 135–145)
Total Bilirubin: 0.3 mg/dL (ref 0.2–1.2)
Total Protein: 7 g/dL (ref 6.0–8.3)

## 2017-05-26 LAB — LIPID PANEL
Cholesterol: 140 mg/dL (ref 0–200)
HDL: 51.6 mg/dL (ref >39.00)
LDL CALC: 78 mg/dL (ref 0–99)
NONHDL: 88.34
Total CHOL/HDL Ratio: 3
Triglycerides: 50 mg/dL (ref 0.0–149.0)
VLDL: 10 mg/dL (ref 0.0–40.0)

## 2017-05-26 LAB — FERRITIN: Ferritin: 5.7 ng/mL — ABNORMAL LOW (ref 10.0–291.0)

## 2017-05-26 LAB — VITAMIN D 25 HYDROXY (VIT D DEFICIENCY, FRACTURES): VITD: 31.41 ng/mL (ref 30.00–100.00)

## 2017-05-26 LAB — VITAMIN B12: Vitamin B-12: 243 pg/mL (ref 211–911)

## 2017-05-26 NOTE — Patient Instructions (Signed)
[name] , Thank you for taking time to come for your Medicare Wellness Visit. I appreciate your ongoing commitment to your health goals. Please review the following plan we discussed and let me know if I can assist you in the future.   These are the goals we discussed: Goals    . DIET - INCREASE WATER INTAKE     Starting 05/26/2017, I will attempt to drink at least 6 glasses of water daily.        This is a list of the screening recommended for you and due dates:  Health Maintenance  Topic Date Due  . Complete foot exam   06/01/2017*  . Colon Cancer Screening  03/03/2018*  . Mammogram  06/24/2017  . Hemoglobin A1C  09/24/2017  . Urine Protein Check  11/20/2017  . Eye exam for diabetics  05/07/2018  . Tetanus Vaccine  04/10/2020  . Flu Shot  Completed  . DEXA scan (bone density measurement)  Completed  .  Hepatitis C: One time screening is recommended by Center for Disease Control  (CDC) for  adults born from [birth year] through 1963.   Completed  . Pneumonia vaccines  Completed  *Topic was postponed. The date shown is not the original due date.   Preventive Care for Adults  A healthy lifestyle and preventive care can promote health and wellness. Preventive health guidelines for adults include the following key practices.  . A routine yearly physical is a good way to check with your health care provider about your health and preventive screening. It is a chance to share any concerns and updates on your health and to receive a thorough exam.  . Visit your dentist for a routine exam and preventive care every 6 months. Brush your teeth twice a day and floss once a day. Good oral hygiene prevents tooth decay and gum disease.  . The frequency of eye exams is based on your age, health, family medical history, use  of contact lenses, and other factors. Follow your health care provider's recommendations for frequency of eye exams.  . Eat a healthy diet. Foods like vegetables, fruits, whole  grains, low-fat dairy products, and lean protein foods contain the nutrients you need without too many calories. Decrease your intake of foods high in solid fats, added sugars, and salt. Eat the right amount of calories for you. Get information about a proper diet from your health care provider, if necessary.  . Regular physical exercise is one of the most important things you can do for your health. Most adults should get at least 150 minutes of moderate-intensity exercise (any activity that increases your heart rate and causes you to sweat) each week. In addition, most adults need muscle-strengthening exercises on 2 or more days a week.  Silver Sneakers may be a benefit available to you. To determine eligibility, you may visit the website: www.silversneakers.com or contact program at 541-752-7198 Mon-Fri between 8AM-8PM.   . Maintain a healthy weight. The body mass index (BMI) is a screening tool to identify possible weight problems. It provides an estimate of body fat based on height and weight. Your health care provider can find your BMI and can help you achieve or maintain a healthy weight.   For adults 20 years and older: ? A BMI below 18.5 is considered underweight. ? A BMI of 18.5 to 24.9 is normal. ? A BMI of 25 to 29.9 is considered overweight. ? A BMI of 30 and above is considered obese.   [name]  Maintain normal blood lipids and cholesterol levels by exercising and minimizing your intake of saturated fat. Eat a balanced diet with plenty of fruit and vegetables. Blood tests for lipids and cholesterol should begin at age 79 and be repeated every 5 years. If your lipid or cholesterol levels are high, you are over 50, or you are at high risk for heart disease, you may need your cholesterol levels checked more frequently. Ongoing high lipid and cholesterol levels should be treated with medicines if diet and exercise are not working.  . If you smoke, find out from your health care provider how to  quit. If you do not use tobacco, please do not start.  . If you choose to drink alcohol, please do not consume more than 2 drinks per day. One drink is considered to be 12 ounces (355 mL) of beer, 5 ounces (148 mL) of wine, or 1.5 ounces (44 mL) of liquor.  . If you are 88-44 years old, ask your health care provider if you should take aspirin to prevent strokes.  . Use sunscreen. Apply sunscreen liberally and repeatedly throughout the day. You should seek shade when your shadow is shorter than you. Protect yourself by wearing long sleeves, pants, a wide-brimmed hat, and sunglasses year round, whenever you are outdoors.  . Once a month, do a whole body skin exam, using a mirror to look at the skin on your back. Tell your health care provider of new moles, moles that have irregular borders, moles that are larger than a pencil eraser, or moles that have changed in shape or color.

## 2017-05-26 NOTE — Progress Notes (Signed)
Subjective:   [name] is a 72 y.o. female who presents for Medicare Annual (Subsequent) preventive examination.  Review of Systems:  N/A Cardiac Risk Factors include: advanced age (>13men, >20 women);diabetes mellitus;dyslipidemia;hypertension     Objective:     Vitals: BP 112/60 (BP Location: Right Arm, Patient Position: Sitting, Cuff Size: Normal)   Pulse 68   Temp 98 F (36.7 C)   Ht 5' 4.75" (1.645 m) Comment: no shoes  Wt 178 lb 8 oz (81 kg)   LMP  (LMP Unknown)   SpO2 97%   BMI 29.93 kg/m   Body mass index is 29.93 kg/m.  Advanced Directives 05/26/2017 12/17/2016 12/17/2016 11/25/2016 05/27/2016 05/10/2016 01/24/2016  Does Patient Have a Medical Advance Directive? Yes Yes Yes No Yes Yes Yes  Type of Paramedic of [place];Living will [place];Living will [place];Living will - Healthcare Power of [place];Living will -  Does patient want to make changes to medical advance directive? - No - Patient declined - - - - -  Copy of [place] in Chart? No - copy requested No - copy requested - - - No - copy requested -    Tobacco Social History   Tobacco Use  Smoking Status Never Smoker  Smokeless Tobacco Never Used     Counseling given: No   Clinical Intake:  Pre-visit preparation completed: Yes  Pain : No/denies pain Pain Score: 0-No pain     Nutritional Status: BMI 25 -29 Overweight Nutritional Risks: None Diabetes: No  How often do you need to have someone help you when you read instructions, pamphlets, or other written materials from your doctor or pharmacy?: 1 - Never What is the last grade level you completed in school?: 12th grade  Interpreter Needed?: No  Comments: pt lives with spouse Information entered by :: LPinson, LPN  Past Medical History:  Diagnosis Date  . Anemia   . Anxiety   . Constipation   . Depressive disorder, not  elsewhere classified   . Diaphragmatic hernia without mention of obstruction or gangrene   . Disorders of bursae and tendons in shoulder region, unspecified   . Diverticulosis of colon (without mention of hemorrhage)   . GERD (gastroesophageal reflux disease)   . History of hiatal hernia   . HTN (hypertension)   . Hypothyroidism   . Internal hemorrhoids without mention of complication   . Obesity, unspecified   . OSA on CPAP    compliant with CPAP  . Other urinary incontinence   . Persistent atrial fibrillation ([place])   . Pure hypercholesterolemia   . Type II diabetes mellitus ([place])   . Urinary frequency   . Urinary urgency   . Vitamin B 12 deficiency    Past Surgical History:  Procedure Laterality Date  . ABDOMINAL HYSTERECTOMY  1972   Have both ovaries  . ATRIAL FIBRILLATION ABLATION  12/17/2016  . ATRIAL FIBRILLATION ABLATION N/A 12/17/2016   Procedure: ATRIAL FIBRILLATION ABLATION;  Surgeon: Thompson Grayer, MD;  Location: [facility];  Service: Cardiovascular;  Laterality: N/A;  . [place]   [facility]; "Dr. [name]"  . CARDIOVERSION N/A 05/27/2016   Procedure: CARDIOVERSION;  Surgeon: [name], MD;  Location: [facility];  Service: Cardiovascular;  Laterality: N/A;  . CARDIOVERSION N/A 11/25/2016   Procedure: CARDIOVERSION;  Surgeon: [name], MD;  Location: [facility];  Service: Cardiovascular;  Laterality: N/A;  .  CATARACT EXTRACTION W/ INTRAOCULAR LENS  IMPLANT, BILATERAL Bilateral   . ELECTROPHYSIOLOGIC STUDY N/A 01/24/2016   Procedure: CARDIOVERSION;  Surgeon: [name], MD;  Location: [facility];  Service: Cardiovascular;  Laterality: N/A;  . LAPAROSCOPIC CHOLECYSTECTOMY    . REDUCTION MAMMAPLASTY Bilateral   . TONSILLECTOMY     Family History  Problem Relation Age of Onset  . Cancer Mother        fullopian tube  . Dementia Mother   . Pulmonary embolism Mother   . Hyperlipidemia Mother   . Diabetes Mother   . Cancer Father          lung  . Hyperlipidemia Father   . Hypertension Sister   . Diabetes Brother   . Stroke Paternal Grandmother   . Stroke Paternal Grandfather    Social History   Socioeconomic History  . Marital status: Married    Spouse name: Laverna Peace  . Number of children: 2  . Years of education: Not on file  . Highest education level: Not on file  Occupational History  . Occupation: Stay at home    Employer: UNEMPLOYED  Social Needs  . Financial resource strain: Not on file  . Food insecurity:    Worry: Not on file    Inability: Not on file  . Transportation needs:    Medical: Not on file    Non-medical: Not on file  Tobacco Use  . Smoking status: Never Smoker  . Smokeless tobacco: Never Used  Substance and Sexual Activity  . Alcohol use: No    Alcohol/week: 0.0 oz  . Drug use: No  . Sexual activity: Yes  Lifestyle  . Physical activity:    Days per week: Not on file    Minutes per session: Not on file  . Stress: Not on file  Relationships  . Social connections:    Talks on phone: Not on file    Gets together: Not on file    Attends religious service: Not on file    Active member of club or organization: Not on file    Attends meetings of clubs or organizations: Not on file    Relationship status: Not on file  Other Topics Concern  . Not on file  Social History Narrative   Exercise walks 3 times daily   Lives with spouse in [place]   homemaker      Diet fruit, occ fast food, salads and lean meat    Outpatient Encounter Medications as of 05/26/2017  Medication Sig  . atorvastatin (LIPITOR) 10 MG tablet TAKE ONE TABLET BY MOUTH EVERY NIGHT AT BEDTIME  . Calcium Carb-Ergocalciferol 500-200 MG-UNIT TABS Take 1 tablet by mouth daily.   [name] diltiazem (CARDIZEM CD) 120 MG 24 hr capsule Take 1 capsule (120 mg total) by mouth daily.  [name] ELIQUIS 5 MG TABS tablet TAKE 1 TABLET BY MOUTH TWICE A DAY  . escitalopram (LEXAPRO) 10 MG tablet TAKE 1 TABLET BY MOUTH ONCE DAILY  .  ferrous sulfate 325 (65 FE) MG tablet Take 325 mg by mouth every other day.  . flecainide (TAMBOCOR) 100 MG tablet Take 1 tablet (100 mg total) by mouth 2 (two) times daily.  [name] glucose blood (ONE TOUCH ULTRA TEST) test strip CHECK BLOOD SUGAR 2 TIMES A DAY AS DIRECTED  Dx Code E11.9  . Insulin Glargine (LANTUS SOLOSTAR) 100 UNIT/ML Solostar Pen Using as instructed  . metFORMIN (GLUCOPHAGE-XR) 500 MG 24 hr tablet TAKE 4 TABLETS BY MOUTH IN THE EVENING WITH  DINNER (Patient taking differently: 2 tablets in the morning, 2 tablets in the evening)  . Multiple Vitamin (MULTIVITAMIN) tablet Take 1 tablet by mouth daily.    . polyethylene glycol powder (GLYCOLAX/MIRALAX) powder Take 17 g by mouth daily.  . sennosides-docusate sodium (SENOKOT-S) 8.6-50 MG tablet Take 1 tablet by mouth daily as needed for constipation.  . TRADJENTA 5 MG TABS tablet TAKE 1 TABLET BY MOUTH EVERY DAY  . traZODone (DESYREL) 50 MG tablet Take 1-2 tablets (50-100 mg total) by mouth at bedtime as needed for sleep. (Patient taking differently: Take 50 mg by mouth at bedtime. )   No facility-administered encounter medications on file as of 05/26/2017.     Activities of Daily Living In your present state of health, do you have any difficulty performing the following activities: 05/26/2017 12/17/2016  Hearing? N -  Vision? N -  Difficulty concentrating or making decisions? N -  Walking or climbing stairs? N -  Dressing or bathing? N -  Doing errands, shopping? N N  Preparing Food and eating ? N -  Using the Toilet? N -  In the past six months, have you accidently leaked urine? Y -  Do you have problems with loss of bowel control? N -  Managing your Medications? N -  Managing your Finances? N -  Housekeeping or managing your Housekeeping? N -  Some recent data might be hidden    Patient Care Team: Jinny Sanders, MD as PCP - General [name], MD as Consulting Physician (Internal Medicine) [name], MD as  Consulting Physician (Cardiology) Wilhelmina Mcardle, MD as Consulting Physician (Pulmonary Disease)    Assessment:   This is a routine wellness examination for Juliette.  Exercise Activities and Dietary recommendations Current Exercise Habits: The patient does not participate in regular exercise at present, Exercise limited by: None identified  Goals    . DIET - INCREASE WATER INTAKE     Starting 05/26/2017, I will attempt to drink at least 6 glasses of water daily.        Fall Risk Fall Risk  05/26/2017 05/10/2016 05/16/2015 05/13/2014 05/11/2013  Falls in the past year? No No No No No    Depression Screen PHQ 2/9 Scores 05/26/2017 11/20/2016 05/10/2016 05/16/2015  PHQ - 2 Score 0 5 0 0  PHQ- 9 Score 0 19 - -     Cognitive Function MMSE - Mini Mental State Exam 05/26/2017 05/10/2016  Orientation to time 5 5  Orientation to Place 5 5  Registration 3 3  Attention/ Calculation 0 0  Recall 3 2  Recall-comments - pt was unable to recall 1 of 3 words  Language- name 2 objects 0 0  Language- repeat 1 1  Language- follow 3 step command 3 3  Language- read & follow direction 0 0  Write a sentence 0 0  Copy design 0 0  Total score 20 19       PLEASE NOTE: A Mini-Cog screen was completed. Maximum score is 20. A value of 0 denotes this part of [place] MMSE was not completed or the patient failed this part of the Mini-Cog screening.   Mini-Cog Screening Orientation to Time - Max 5 pts Orientation to Place - Max 5 pts Registration - Max 3 pts Recall - Max 3 pts Language Repeat - Max 1 pts Language Follow 3 Step Command - Max 3 pts   Immunization History  Administered Date(s) Administered  . Influenza Split 01/03/2011, 01/17/2012  .  Influenza Whole 11/25/2007, 01/18/2009, 11/28/2009  . Influenza, High Dose Seasonal PF 12/19/2016  . Influenza,inj,Quad PF,6+ Mos 01/06/2013, 12/07/2013, 12/14/2014, 11/24/2015  . Pneumococcal Conjugate-13 05/11/2013  . Pneumococcal Polysaccharide-23  01/17/2012, 05/26/2017  . Td 04/10/2010  . Zoster 05/13/2014    Screening Tests Health Maintenance  Topic Date Due  . FOOT EXAM  06/01/2017 (Originally 02/28/2017)  . COLONOSCOPY  03/03/2018 (Originally 03/04/2013)  . MAMMOGRAM  06/24/2017  . HEMOGLOBIN A1C  09/24/2017  . URINE MICROALBUMIN  11/20/2017  . OPHTHALMOLOGY EXAM  05/07/2018  . TETANUS/TDAP  04/10/2020  . INFLUENZA VACCINE  Completed  . DEXA SCAN  Completed  . Hepatitis C Screening  Completed  . PNA vac Low Risk Adult  Completed      Plan:     I have personally reviewed, addressed, and noted the following in the patient's chart:  A. Medical and social history B. Use of alcohol, tobacco or illicit drugs  C. Current medications and supplements D. Functional ability and status E.  Nutritional status F.  Physical activity G. Advance directives H. List of other physicians I.  Hospitalizations, surgeries, and ER visits in previous 12 months J.  [place] to include hearing, vision, cognitive, depression L. Referrals and appointments - none  In addition, I have reviewed and discussed with patient certain preventive protocols, quality metrics, and best practice recommendations. A written personalized care plan for preventive services as well as general preventive health recommendations were provided to patient.  See attached scanned questionnaire for additional information.   Signed,   Lindell Noe, MHA, BS, LPN Health Coach

## 2017-05-26 NOTE — Progress Notes (Signed)
PCP notes:   Health maintenance:  Foot exam - PCP please address at next appt Colonoscopy - PCP please address at next appt  Abnormal screenings:   Hearing - failed  Hearing Screening   125Hz  250Hz  500Hz  1000Hz  2000Hz  3000Hz  4000Hz  6000Hz  8000Hz   Right ear:   40 40 40  0    Left ear:   40 40 40  0      Patient concerns:   Pt reports concerns with urinary incontinence and increased shedding of hair.    Nurse concerns:  None  Next PCP appt:   05/30/17 @ 1115

## 2017-05-26 NOTE — Progress Notes (Signed)
I reviewed health advisor's note, was available for consultation, and agree with documentation and plan.   Signed,  Nickola Lenig T. Rayvin Abid, MD  

## 2017-05-30 ENCOUNTER — Encounter: Payer: Self-pay | Admitting: Family Medicine

## 2017-05-30 ENCOUNTER — Encounter: Payer: Self-pay | Admitting: Internal Medicine

## 2017-05-30 ENCOUNTER — Ambulatory Visit (INDEPENDENT_AMBULATORY_CARE_PROVIDER_SITE_OTHER): Payer: Medicare HMO | Admitting: Family Medicine

## 2017-05-30 ENCOUNTER — Other Ambulatory Visit: Payer: Self-pay | Admitting: Family Medicine

## 2017-05-30 ENCOUNTER — Other Ambulatory Visit: Payer: Self-pay

## 2017-05-30 VITALS — BP 140/62 | HR 64 | Temp 98.4°F | Ht 64.75 in | Wt 178.5 lb

## 2017-05-30 DIAGNOSIS — E559 Vitamin D deficiency, unspecified: Secondary | ICD-10-CM | POA: Diagnosis not present

## 2017-05-30 DIAGNOSIS — N3941 Urge incontinence: Secondary | ICD-10-CM | POA: Diagnosis not present

## 2017-05-30 DIAGNOSIS — F325 Major depressive disorder, single episode, in full remission: Secondary | ICD-10-CM | POA: Diagnosis not present

## 2017-05-30 DIAGNOSIS — Z0000 Encounter for general adult medical examination without abnormal findings: Secondary | ICD-10-CM | POA: Diagnosis not present

## 2017-05-30 DIAGNOSIS — I481 Persistent atrial fibrillation: Secondary | ICD-10-CM

## 2017-05-30 DIAGNOSIS — D508 Other iron deficiency anemias: Secondary | ICD-10-CM | POA: Diagnosis not present

## 2017-05-30 DIAGNOSIS — E538 Deficiency of other specified B group vitamins: Secondary | ICD-10-CM | POA: Diagnosis not present

## 2017-05-30 DIAGNOSIS — I10 Essential (primary) hypertension: Secondary | ICD-10-CM | POA: Diagnosis not present

## 2017-05-30 DIAGNOSIS — E7800 Pure hypercholesterolemia, unspecified: Secondary | ICD-10-CM | POA: Diagnosis not present

## 2017-05-30 DIAGNOSIS — E039 Hypothyroidism, unspecified: Secondary | ICD-10-CM

## 2017-05-30 DIAGNOSIS — Z139 Encounter for screening, unspecified: Secondary | ICD-10-CM

## 2017-05-30 DIAGNOSIS — Z794 Long term (current) use of insulin: Secondary | ICD-10-CM | POA: Diagnosis not present

## 2017-05-30 DIAGNOSIS — E119 Type 2 diabetes mellitus without complications: Secondary | ICD-10-CM | POA: Diagnosis not present

## 2017-05-30 DIAGNOSIS — I4819 Other persistent atrial fibrillation: Secondary | ICD-10-CM

## 2017-05-30 LAB — HM DIABETES FOOT EXAM

## 2017-05-30 MED ORDER — MIRABEGRON ER 25 MG PO TB24: 25.0000 mg | ORAL_TABLET | Freq: Every day | ORAL | 11 refills | Status: DC

## 2017-05-30 MED ORDER — SOLIFENACIN SUCCINATE 5 MG PO TABS: 5.0000 mg | ORAL_TABLET | Freq: Every day | ORAL | 3 refills | Status: DC

## 2017-05-30 NOTE — Assessment & Plan Note (Signed)
Stable control on current regimen. 

## 2017-05-30 NOTE — Assessment & Plan Note (Addendum)
Good control on  NO medication.

## 2017-05-30 NOTE — Assessment & Plan Note (Signed)
Stable on current regimen. Followed by Cardiology.

## 2017-05-30 NOTE — Assessment & Plan Note (Signed)
Restart iron and take  senecot along with it for any constipation.

## 2017-05-30 NOTE — Progress Notes (Signed)
Subjective:    Patient ID: [name], female    DOB: 1945/11/10, 72 y.o.   MRN: [id number]  HPI   The patient presents for  complete physical and review of chronic health problems.  She also has the following acute concerns today:   Long term chronic incontinence... urge not stress.  Increased frequency 6 times a day and 3 times at night.  Loses control and has accident if she cannot get there in time.  The patient saw Candis Musa, LPN for medicare wellness. Note reviewed in detail and important notes copied below.    Health maintenance:  Foot exam - PCP please address at next appt Colonoscopy - PCP please address at next appt  Abnormal screenings:   Hearing - failed             Hearing Screening   125Hz  250Hz  500Hz  1000Hz  2000Hz  3000Hz  4000Hz  6000Hz  8000Hz   Right ear:   40 40 40  0    Left ear:   40 40 40  0      Patient concerns:   Pt reports concerns with urinary incontinence and increased shedding of hair.     Today: Diabetes:  Followed  By ENDO.  Excellent control on current regimen. On Tradjenta, metformin. Lab Results  Component Value Date   HGBA1C 6.7 03/27/2017     Afib followed by Cardiology on eliquis. Last cardioversion 01/2017.Martin Majestic back into afib last week.  Inceased diltiazem and flecanide... Symptoms improved some heart rate now down from 135.  No current chest pain or SOB, mainly fatigue.  Elevated Cholesterol:  LDL at goal on lipitor. Lab Results  Component Value Date   CHOL 140 05/26/2017   HDL 51.60 05/26/2017   LDLCALC 78 05/26/2017   LDLDIRECT 163.1 07/04/2011   TRIG 50.0 05/26/2017   CHOLHDL 3 05/26/2017   Using medications without problems: no Muscle aches: no Diet compliance: moderate Exercise: 2-3 times a week Other complaints   HTN, well controlled on cardiazem.  At home 124/80 Pt had to wait.. May be elevated due to this. BP Readings from Last 3 Encounters:  05/30/17 140/62  05/26/17 112/60    03/27/17 138/62    Depression, major , in remission: on no medications.   Anemia: better since 01/2016 in hospital for cardioversion.. No blood loss seen or known. Hg 11.3  No clear GI loss.   Iron  325 mg daily.. Takes senecot with it.  Social History /Family History/Past Medical History reviewed in detail and updated in EMR if needed. Blood pressure 140/62, pulse 64, temperature 98.4 F (36.9 C), temperature source Oral, height 5' 4.75" (1.645 m), weight 178 lb 8 oz (81 kg). Review of Systems  Constitutional: Negative for fatigue and fever.  HENT: Negative for congestion.   Eyes: Negative for pain.  Respiratory: Negative for cough and shortness of breath.   Cardiovascular: Negative for chest pain, palpitations and leg swelling.  Gastrointestinal: Negative for abdominal pain.  Genitourinary: Negative for dysuria and vaginal bleeding.  Musculoskeletal: Negative for back pain.  Neurological: Negative for syncope, light-headedness and headaches.  Psychiatric/Behavioral: Negative for dysphoric mood.       Objective:   Physical Exam  Constitutional: Vital signs are normal. She appears well-developed and well-nourished. She is cooperative.  Non-toxic appearance. She does not appear ill. No distress.  HENT:  Head: Normocephalic.  Right Ear: Hearing, tympanic membrane, external ear and ear canal normal.  Left Ear: Hearing, tympanic membrane, external ear and ear canal normal.  Nose: Nose normal.  Eyes: Pupils are equal, round, and reactive to light. Conjunctivae, EOM and lids are normal. Lids are everted and swept, no foreign bodies found.  Neck: Trachea normal and normal range of motion. Neck supple. Carotid bruit is not present. No thyroid mass and no thyromegaly present.  Cardiovascular: Normal rate, regular rhythm, S1 normal, S2 normal, normal heart sounds and intact distal pulses. Exam reveals no gallop.  No murmur heard. Pulmonary/Chest: Effort normal and breath sounds normal.  No respiratory distress. She has no wheezes. She has no rhonchi. She has no rales.  Abdominal: Soft. Normal appearance and bowel sounds are normal. She exhibits no distension, no fluid wave, no abdominal bruit and no mass. There is no hepatosplenomegaly. There is no tenderness. There is no rebound, no guarding and no CVA tenderness. No hernia.  Lymphadenopathy:    She has no cervical adenopathy.    She has no axillary adenopathy.  Neurological: She is alert. She has normal strength. No cranial nerve deficit or sensory deficit.  Skin: Skin is warm, dry and intact. No rash noted.  Psychiatric: Her speech is normal and behavior is normal. Judgment normal. Her mood appears not anxious. Cognition and memory are normal. She does not exhibit a depressed mood.     Diabetic foot exam: Normal inspection No skin breakdown No calluses  Normal DP pulses Normal sensation to light touch and monofilament Nails normal      Assessment & Plan:  The patient's preventative maintenance and recommended screening tests for an annual wellness exam were reviewed in full today. Brought up to date unless services declined.  Counselled on the importance of diet, exercise, and its role in overall health and mortality. The patient's FH and SH was reviewed, including their home life, tobacco status, and drug and alcohol status.   Vaccines: Uptodate with PNA, Tdap, shingles, flu . Mammo: 06/2016.[name] Set up 4/25 Colon: 07/2003, Dr. Carlean Purl,  ifob last year neg.. This year.Plans to do colonoscopy  DEXA:06/2014 normal , repeat in 5 years Nonsmoker PAP/DVE: has ovaries, had partial hysterectomy. DVE  Every 2 years as mother with cancer in ovary, no pap. Hep C : done

## 2017-05-30 NOTE — Assessment & Plan Note (Signed)
Well controlled. Continue current medication.  

## 2017-05-30 NOTE — Assessment & Plan Note (Signed)
NMl on supplement.

## 2017-05-30 NOTE — Assessment & Plan Note (Signed)
Cannot use myrbetriq as it interacts with  Flecanide. Trail of vesicare low dose.

## 2017-05-30 NOTE — Patient Instructions (Addendum)
Start Vesicare for urinary incontinence.. Call if low dose in not helping or SE.  Set up colonscopy on your own.

## 2017-05-30 NOTE — Assessment & Plan Note (Signed)
Excellent control followed by ENDO.

## 2017-06-14 ENCOUNTER — Other Ambulatory Visit: Payer: Self-pay | Admitting: Family Medicine

## 2017-06-26 ENCOUNTER — Ambulatory Visit
Admission: RE | Admit: 2017-06-26 | Discharge: 2017-06-26 | Disposition: A | Discharge status: Home / Self Care | Payer: Medicare HMO | Source: Ambulatory Visit | Attending: Family Medicine | Admitting: Family Medicine

## 2017-06-26 DIAGNOSIS — Z1231 Encounter for screening mammogram for malignant neoplasm of breast: Secondary | ICD-10-CM | POA: Diagnosis not present

## 2017-06-26 DIAGNOSIS — Z139 Encounter for screening, unspecified: Secondary | ICD-10-CM

## 2017-06-30 ENCOUNTER — Encounter: Payer: Self-pay | Admitting: Internal Medicine

## 2017-06-30 ENCOUNTER — Ambulatory Visit: Payer: Medicare HMO | Admitting: Internal Medicine

## 2017-06-30 VITALS — BP 132/62 | HR 57 | Ht 64.75 in | Wt 182.0 lb

## 2017-06-30 DIAGNOSIS — G4733 Obstructive sleep apnea (adult) (pediatric): Secondary | ICD-10-CM | POA: Diagnosis not present

## 2017-06-30 DIAGNOSIS — I481 Persistent atrial fibrillation: Secondary | ICD-10-CM

## 2017-06-30 DIAGNOSIS — I4819 Other persistent atrial fibrillation: Secondary | ICD-10-CM

## 2017-06-30 DIAGNOSIS — I10 Essential (primary) hypertension: Secondary | ICD-10-CM

## 2017-06-30 MED ORDER — FLECAINIDE ACETATE 50 MG PO TABS: 50.0000 mg | ORAL_TABLET | Freq: Two times a day (BID) | ORAL | 2 refills | Status: DC

## 2017-06-30 NOTE — Patient Instructions (Addendum)
Medication Instructions:  Your physician has recommended you make the following change in your medication:  1. DECREASE Flecainide to 50 mg twice daily.  Labwork: None ordered  Testing/Procedures: None ordered  Follow-Up: Your physician recommends that you schedule a follow-up appointment in: 3 months with Dr. Rayann Heman.  * If you need a refill on your cardiac medications before your next appointment, please call your pharmacy.   *Please note that any paperwork needing to be filled out by the provider will need to be addressed at the front desk prior to seeing the provider. Please note that any FMLA, disability or other documents regarding health condition is subject to a $25.00 charge that must be received prior to completion of paperwork in the form of a money order or check.  Thank you for choosing [facility]!!

## 2017-06-30 NOTE — Progress Notes (Signed)
PCP: Jinny Sanders, MD Primary Cardiologist: Dr Fletcher Anon Primary EP: Dr Rayann Heman  [name] is a 72 y.o. female who presents today for routine electrophysiology followup.  Since last being seen in our clinic, the patient reports doing very well.  Today, she denies symptoms of palpitations, chest pain, shortness of breath,  lower extremity edema, dizziness, presyncope, or syncope.  The patient is otherwise without complaint today.   Past Medical History:  Diagnosis Date  . Anemia   . Anxiety   . Constipation   . Depressive disorder, not elsewhere classified   . Diaphragmatic hernia without mention of obstruction or gangrene   . Disorders of bursae and tendons in shoulder region, unspecified   . Diverticulosis of colon (without mention of hemorrhage)   . GERD (gastroesophageal reflux disease)   . History of hiatal hernia   . HTN (hypertension)   . Hypothyroidism   . Internal hemorrhoids without mention of complication   . Obesity, unspecified   . OSA on CPAP    compliant with CPAP  . Other urinary incontinence   . Persistent atrial fibrillation ([place])   . Pure hypercholesterolemia   . Type II diabetes mellitus ([place])   . Urinary frequency   . Urinary urgency   . Vitamin B 12 deficiency    Past Surgical History:  Procedure Laterality Date  . ABDOMINAL HYSTERECTOMY  1972   Have both ovaries  . ATRIAL FIBRILLATION ABLATION  12/17/2016  . ATRIAL FIBRILLATION ABLATION N/A 12/17/2016   Procedure: ATRIAL FIBRILLATION ABLATION;  Surgeon: Thompson Grayer, MD;  Location: [place] [facility];  Service: Cardiovascular;  Laterality: N/A;  . [place]   [facility]; "Dr. [name]"  . CARDIOVERSION N/A 05/27/2016   Procedure: CARDIOVERSION;  Surgeon: [name], MD;  Location: [facility];  Service: Cardiovascular;  Laterality: N/A;  . CARDIOVERSION N/A 11/25/2016   Procedure: CARDIOVERSION;  Surgeon: [name], MD;  Location: [facility];  Service: Cardiovascular;   Laterality: N/A;  . CATARACT EXTRACTION W/ INTRAOCULAR LENS  IMPLANT, BILATERAL Bilateral   . ELECTROPHYSIOLOGIC STUDY N/A 01/24/2016   Procedure: CARDIOVERSION;  Surgeon: [name], MD;  Location: [facility];  Service: Cardiovascular;  Laterality: N/A;  . LAPAROSCOPIC CHOLECYSTECTOMY    . REDUCTION MAMMAPLASTY Bilateral   . TONSILLECTOMY      ROS- all systems are reviewed and negatives except as per HPI above  Current Outpatient Medications  Medication Sig Dispense Refill  . atorvastatin (LIPITOR) 10 MG tablet TAKE ONE TABLET BY MOUTH EVERY NIGHT AT BEDTIME 90 tablet 3  . Blood Glucose Monitoring Suppl (ONE TOUCH ULTRA 2) w/Device KIT USE AS DIRECTED E11.9  0  . Calcium Carb-Ergocalciferol 500-200 MG-UNIT TABS Take 1 tablet by mouth daily.     [name] diltiazem (CARDIZEM CD) 120 MG 24 hr capsule Take 1 capsule (120 mg total) by mouth daily. 90 capsule 3  . ELIQUIS 5 MG TABS tablet TAKE 1 TABLET BY MOUTH TWICE A DAY 60 tablet 3  . escitalopram (LEXAPRO) 10 MG tablet TAKE 1 TABLET BY MOUTH ONCE DAILY 90 tablet 0  . ferrous sulfate 325 (65 FE) MG tablet Take 325 mg by mouth every other day.    . flecainide (TAMBOCOR) 100 MG tablet Take 1 tablet (100 mg total) by mouth 2 (two) times daily. 180 tablet 3  . glucose blood (ONE TOUCH ULTRA TEST) test strip CHECK BLOOD SUGAR 2 TIMES A DAY AS DIRECTED  Dx Code E11.9 200 each 3  .  metFORMIN (GLUCOPHAGE-XR) 500 MG 24 hr tablet TAKE 4 TABLETS BY MOUTH IN THE EVENING WITH DINNER (Patient taking differently: 2 tablets in the morning, 2 tablets in the evening) 360 tablet 2  . Multiple Vitamin (MULTIVITAMIN) tablet Take 1 tablet by mouth daily.      . polyethylene glycol powder (GLYCOLAX/MIRALAX) powder Take 17 g by mouth daily. 3350 g 0  . sennosides-docusate sodium (SENOKOT-S) 8.6-50 MG tablet Take 1 tablet by mouth daily as needed for constipation.    . solifenacin (VESICARE) 5 MG tablet Take 1 tablet (5 mg total) by mouth daily. 30 tablet 3  .  TRADJENTA 5 MG TABS tablet TAKE 1 TABLET BY MOUTH EVERY DAY 30 tablet 1  . traZODone (DESYREL) 50 MG tablet Take 1-2 tablets (50-100 mg total) by mouth at bedtime as needed for sleep. (Patient taking differently: Take 50 mg by mouth at bedtime. ) 60 tablet 5  . Insulin Glargine (LANTUS SOLOSTAR) 100 UNIT/ML Solostar Pen Using as instructed     No current facility-administered medications for this visit.     Physical Exam: Vitals:   06/30/17 1200  BP: 132/62  Pulse: (!) 57  Weight: 182 lb (82.6 kg)  Height: 5' 4.75" (1.645 m)    GEN- The patient is well appearing, alert and oriented x 3 today.   Head- normocephalic, atraumatic Eyes-  Sclera clear, conjunctiva pink Ears- hearing intact Oropharynx- clear Lungs- Clear to ausculation bilaterally, normal work of breathing Heart- Regular rate and rhythm, no murmurs, rubs or gallops, PMI not laterally displaced GI- soft, NT, ND, + BS Extremities- no clubbing, cyanosis, or edema  EKG tracing ordered today is personally reviewed and shows sinus rhythm 57 bpm, PR 224 msec, QRS 108 msec, QTc 420 msec  Assessment and Plan:  1. Persistent afib Well controlled post ablation.  No afib post ablation. She remains reluctant to stop flecainide.  I have encouraged her to at least reduce flecainide to 22m BID today.  She is willing to try.   chads2vasc score is 4.  On eliquis  2. HTN Stable No change required today  3. Mild to moderate MR Follow with echo No symptomatic changes  4. OSA Compliance with CPAP encouraged  Return to see me in 3 months  JThompson GrayerMD, [facility]4/29/2019 12:09 PM

## 2017-07-15 ENCOUNTER — Encounter: Payer: Self-pay | Admitting: Internal Medicine

## 2017-07-15 ENCOUNTER — Ambulatory Visit (INDEPENDENT_AMBULATORY_CARE_PROVIDER_SITE_OTHER): Payer: Medicare HMO | Admitting: Internal Medicine

## 2017-07-15 VITALS — BP 124/62 | HR 73 | Temp 98.2°F | Wt 177.0 lb

## 2017-07-15 DIAGNOSIS — H9202 Otalgia, left ear: Secondary | ICD-10-CM | POA: Diagnosis not present

## 2017-07-15 NOTE — Progress Notes (Signed)
Subjective:    Patient ID: [name], female    DOB: [birth year], 72 y.o.   MRN: [id number]  HPI  Pt presents to the clinic today with c/o headache, intermittent left ear pain. She reports this started yesterday. The headache is located above her left ear. She describes the ear pain as sharp and pressure. She reports her hearing is muffled. She denies runny nose, nasal congestion, sore throat or cough. She denies fever, chills or body aches. She has tried Peroxide with some relief.  Review of Systems      Past Medical History:  Diagnosis Date  . Anemia   . Anxiety   . Constipation   . Depressive disorder, not elsewhere classified   . Diaphragmatic hernia without mention of obstruction or gangrene   . Disorders of bursae and tendons in shoulder region, unspecified   . Diverticulosis of colon (without mention of hemorrhage)   . External hemorrhoid   . GERD (gastroesophageal reflux disease)   . History of hiatal hernia   . HTN (hypertension)   . Hypothyroidism   . Internal hemorrhoids without mention of complication   . Obesity, unspecified   . OSA on CPAP    compliant with CPAP  . Other urinary incontinence   . Persistent atrial fibrillation ([place])   . Pure hypercholesterolemia   . Type II diabetes mellitus ([place])   . Urinary frequency   . Urinary urgency   . Vitamin B 12 deficiency     Current Outpatient Medications  Medication Sig Dispense Refill  . atorvastatin (LIPITOR) 10 MG tablet TAKE ONE TABLET BY MOUTH EVERY NIGHT AT BEDTIME 90 tablet 3  . Blood Glucose Monitoring Suppl (ONE TOUCH ULTRA 2) w/Device KIT USE AS DIRECTED E11.9  0  . Calcium Carb-Ergocalciferol 500-200 MG-UNIT TABS Take 1 tablet by mouth daily.     [name] diltiazem (CARDIZEM CD) 120 MG 24 hr capsule Take 1 capsule (120 mg total) by mouth daily. 90 capsule 3  . ELIQUIS 5 MG TABS tablet TAKE 1 TABLET BY MOUTH TWICE A DAY 60 tablet 3  . escitalopram (LEXAPRO) 10 MG tablet TAKE 1 TABLET BY MOUTH ONCE DAILY  90 tablet 0  . ferrous sulfate 325 (65 FE) MG tablet Take 325 mg by mouth every other day.    . flecainide (TAMBOCOR) 50 MG tablet Take 1 tablet (50 mg total) by mouth 2 (two) times daily. (Patient taking differently: Take 25 mg by mouth 2 (two) times daily. ) 180 tablet 2  . glucose blood (ONE TOUCH ULTRA TEST) test strip CHECK BLOOD SUGAR 2 TIMES A DAY AS DIRECTED  Dx Code E11.9 200 each 3  . Insulin Glargine (LANTUS SOLOSTAR) 100 UNIT/ML Solostar Pen Using as instructed    . metFORMIN (GLUCOPHAGE-XR) 500 MG 24 hr tablet TAKE 4 TABLETS BY MOUTH IN THE EVENING WITH DINNER (Patient taking differently: 2 tablets in the morning, 2 tablets in the evening) 360 tablet 2  . Multiple Vitamin (MULTIVITAMIN) tablet Take 1 tablet by mouth daily.      . polyethylene glycol powder (GLYCOLAX/MIRALAX) powder Take 17 g by mouth daily. 3350 g 0  . sennosides-docusate sodium (SENOKOT-S) 8.6-50 MG tablet Take 1 tablet by mouth daily as needed for constipation.    . solifenacin (VESICARE) 5 MG tablet Take 1 tablet (5 mg total) by mouth daily. 30 tablet 3  . TRADJENTA 5 MG TABS tablet TAKE 1 TABLET BY MOUTH EVERY DAY 30 tablet 1  . traZODone (DESYREL) 50 MG  tablet Take 1-2 tablets (50-100 mg total) by mouth at bedtime as needed for sleep. (Patient taking differently: Take 50 mg by mouth at bedtime. ) 60 tablet 5   No current facility-administered medications for this visit.     Allergies  Allergen Reactions  . Pravastatin Sodium Other (See Comments)    Leg pain  . Contrast Media [Iodinated Diagnostic Agents] Rash  . Cortisone Hives and Rash    Family History  Problem Relation Age of Onset  . Cancer Mother        fullopian tube  . Dementia Mother   . Pulmonary embolism Mother   . Hyperlipidemia Mother   . Diabetes Mother   . Cancer Father        lung  . Hyperlipidemia Father   . Hypertension Sister   . Diabetes Brother   . Stroke Paternal Grandmother   . Stroke Paternal Grandfather     Social  History   Socioeconomic History  . Marital status: Married    Spouse name: Laverna Peace  . Number of children: 2  . Years of education: Not on file  . Highest education level: Not on file  Occupational History  . Occupation: Stay at home    Employer: UNEMPLOYED  Social Needs  . Financial resource strain: Not on file  . Food insecurity:    Worry: Not on file    Inability: Not on file  . Transportation needs:    Medical: Not on file    Non-medical: Not on file  Tobacco Use  . Smoking status: Never Smoker  . Smokeless tobacco: Never Used  Substance and Sexual Activity  . Alcohol use: No    Alcohol/week: 0.0 oz  . Drug use: No  . Sexual activity: Yes  Lifestyle  . Physical activity:    Days per week: Not on file    Minutes per session: Not on file  . Stress: Not on file  Relationships  . Social connections:    Talks on phone: Not on file    Gets together: Not on file    Attends religious service: Not on file    Active member of club or organization: Not on file    Attends meetings of clubs or organizations: Not on file    Relationship status: Not on file  . Intimate partner violence:    Fear of current or ex partner: Not on file    Emotionally abused: Not on file    Physically abused: Not on file    Forced sexual activity: Not on file  Other Topics Concern  . Not on file  Social History Narrative   Exercise walks 3 times daily   Lives with spouse in [place]   homemaker      Diet fruit, occ fast food, salads and lean meat     Constitutional: Pt reports headache. Denies fever, malaise, fatigue, or abrupt weight changes.  HEENT: Pt reports ear pain. Denies eye pain, eye redness, ringing in the ears, wax buildup, runny nose, nasal congestion, bloody nose, or sore throat. Respiratory: Denies difficulty breathing, shortness of breath, cough or sputum production.    No other specific complaints in a complete review of systems (except as listed in HPI above).  Objective:    Physical Exam   BP 124/62   Pulse 73   Temp 98.2 F (36.8 C) (Oral)   Wt 177 lb (80.3 kg)   LMP  (LMP Unknown)   SpO2 97%   BMI 29.68 kg/m  Wt  Readings from Last 3 Encounters:  07/15/17 177 lb (80.3 kg)  06/30/17 182 lb (82.6 kg)  05/30/17 178 lb 8 oz (81 kg)    General: Appears her stated age, obese in NAD. Skin: Warm, dry and intact. No rashes noted. HEENT: Head: normal shape and size; Ears: Tm's gray and intact, normal light reflex; Throat/Mouth: Teeth present, mucosa pink and moist, + PND, no exudate, lesions or ulcerations noted.  Neck:  No adenopathy noted.  BMET    Component Value Date/Time   NA 138 05/26/2017 1046   NA 139 12/02/2016 1233   K 4.8 05/26/2017 1046   CL 102 05/26/2017 1046   CO2 29 05/26/2017 1046   GLUCOSE 120 (H) 05/26/2017 1046   BUN 14 05/26/2017 1046   BUN 17 12/02/2016 1233   CREATININE 0.88 05/26/2017 1046   CREATININE 0.94 09/24/2013 1610   CALCIUM 10.0 05/26/2017 1046   GFRNONAA >60 12/17/2016 0604   GFRAA >60 12/17/2016 0604    Lipid Panel     Component Value Date/Time   CHOL 140 05/26/2017 1046   CHOL 147 11/20/2016 0919   TRIG 50.0 05/26/2017 1046   HDL 51.60 05/26/2017 1046   HDL 47 11/20/2016 0919   CHOLHDL 3 05/26/2017 1046   VLDL 10.0 05/26/2017 1046   LDLCALC 78 05/26/2017 1046   LDLCALC 89 11/20/2016 0919    CBC    Component Value Date/Time   WBC 6.8 05/26/2017 1046   RBC 3.92 05/26/2017 1046   HGB 11.3 (L) 05/26/2017 1046   HGB 11.7 12/02/2016 1233   HCT 34.0 (L) 05/26/2017 1046   HCT 35.8 12/02/2016 1233   PLT 290.0 05/26/2017 1046   PLT 297 12/02/2016 1233   MCV 86.6 05/26/2017 1046   MCV 88 12/02/2016 1233   MCH 28.6 12/17/2016 0604   MCHC 33.2 05/26/2017 1046   RDW 14.5 05/26/2017 1046   RDW 14.6 12/02/2016 1233   LYMPHSABS 1.6 05/26/2017 1046   LYMPHSABS 2.0 12/02/2016 1233   MONOABS 0.3 05/26/2017 1046   EOSABS 0.1 05/26/2017 1046   EOSABS 0.1 12/02/2016 1233   BASOSABS 0.0 05/26/2017 1046     BASOSABS 0.0 12/02/2016 1233    Hgb A1C Lab Results  Component Value Date   HGBA1C 6.7 03/27/2017           Assessment & Plan:   Otalgia, Left;  Exam benign Try Flonase OTC  Return precautions discussed Webb Silversmith, NP

## 2017-07-15 NOTE — Patient Instructions (Signed)

## 2017-07-21 ENCOUNTER — Ambulatory Visit: Payer: Medicare HMO | Admitting: Internal Medicine

## 2017-07-21 ENCOUNTER — Telehealth: Payer: Self-pay

## 2017-07-21 ENCOUNTER — Other Ambulatory Visit (INDEPENDENT_AMBULATORY_CARE_PROVIDER_SITE_OTHER): Payer: Medicare HMO

## 2017-07-21 ENCOUNTER — Encounter: Payer: Self-pay | Admitting: Internal Medicine

## 2017-07-21 VITALS — BP 132/64 | HR 64 | Ht 64.75 in | Wt 180.5 lb

## 2017-07-21 DIAGNOSIS — T887XXA Unspecified adverse effect of drug or medicament, initial encounter: Secondary | ICD-10-CM

## 2017-07-21 DIAGNOSIS — I4819 Other persistent atrial fibrillation: Secondary | ICD-10-CM

## 2017-07-21 DIAGNOSIS — D508 Other iron deficiency anemias: Secondary | ICD-10-CM

## 2017-07-21 DIAGNOSIS — I481 Persistent atrial fibrillation: Secondary | ICD-10-CM

## 2017-07-21 DIAGNOSIS — Z7901 Long term (current) use of anticoagulants: Secondary | ICD-10-CM

## 2017-07-21 LAB — CBC WITH DIFFERENTIAL/PLATELET
BASOS PCT: 0.3 % (ref 0.0–3.0)
Basophils Absolute: 0 10*3/uL (ref 0.0–0.1)
EOS PCT: 1.3 % (ref 0.0–5.0)
Eosinophils Absolute: 0.1 10*3/uL (ref 0.0–0.7)
HEMATOCRIT: 34.5 % — AB (ref 36.0–46.0)
HEMOGLOBIN: 11.4 g/dL — AB (ref 12.0–15.0)
LYMPHS PCT: 26.2 % (ref 12.0–46.0)
Lymphs Abs: 1.9 10*3/uL (ref 0.7–4.0)
MCHC: 33.2 g/dL (ref 30.0–36.0)
MCV: 87.4 fl (ref 78.0–100.0)
MONO ABS: 0.5 10*3/uL (ref 0.1–1.0)
MONOS PCT: 6.9 % (ref 3.0–12.0)
Neutro Abs: 4.8 10*3/uL (ref 1.4–7.7)
Neutrophils Relative %: 65.3 % (ref 43.0–77.0)
Platelets: 282 10*3/uL (ref 150.0–400.0)
RBC: 3.95 Mil/uL (ref 3.87–5.11)
RDW: 14.5 % (ref 11.5–15.5)
WBC: 7.3 10*3/uL (ref 4.0–10.5)

## 2017-07-21 LAB — FERRITIN: Ferritin: 6.1 ng/mL — ABNORMAL LOW (ref 10.0–291.0)

## 2017-07-21 NOTE — Telephone Encounter (Signed)
[facility] Pre-operative Risk Assessment     Request for surgical clearance:     Endoscopy Procedure  What type of surgery is being performed?     EGD and Colon  When is this surgery scheduled?     09/02/17 at 3pm  What type of clearance is required ?   Pharmacy  Are there any medications that need to be held prior to surgery and how long? Eliquis, 1-2 days  Practice name and name of physician performing surgery?      [facility]  What is your office phone and fax number?      Phone- [phone]  [phone]  Anesthesia type (None, local, MAC, general) ?       MAC

## 2017-07-21 NOTE — Patient Instructions (Addendum)
You have been scheduled for an endoscopy and colonoscopy. Please follow the written instructions given to you at your visit today. Please pick up your prep supplies at the pharmacy. If you use inhalers (even only as needed), please bring them with you on the day of your procedure. Your physician has requested that you go to www.[email or web address].com and enter the access code given to you at your visit today. This web site gives a general overview about your procedure. However, you should still follow specific instructions given to you by our office regarding your preparation for the procedure.    Your provider has requested that you go to the basement level for lab work before leaving today. Press "B" on the elevator. The lab is located at the first door on the left as you exit the elevator.    You will be contaced by our office prior to your procedure for directions on holding your Eliquis If you do not hear from our office 1 week prior to your scheduled procedure, please call [phone] to discuss.   I appreciate the opportunity to care for you. Silvano Rusk, MD, [facility]

## 2017-07-21 NOTE — Progress Notes (Signed)
[name] 72 y.o. 25-Apr-1945 [id number]  Assessment & Plan:   Encounter Diagnoses  Name Primary?  . Other iron deficiency anemia Yes  . Medication side effect - constipation from ferrous sulfate   . Long term current use of anticoagulant therapy - eliquis - A fib   . Persistent atrial fibrillation (HCC) - s/p successful ablation     EGD/COLON to look for GI causes of Fe defic anemia (I.e. Bleeding lesions) and can do duodenal bxs to look for celiac dz  Off Eliquis - The risks and benefits as well as alternatives of endoscopic procedure(s) have been discussed and reviewed. All questions answered. The patient agrees to proceed.  Extra rare but real risk of stroke off Eliquis reviewed Ferritin + CBC (results below) May need diff iron ? Parenteral B12 is borderline (243) oral supplementation may make sense recommended by PCP but not on list - did not discuss when here will revew at procedures (metformin possible culprit)  Lab Results  Component Value Date   FERRITIN 6.1 (L) 07/21/2017   Lab Results  Component Value Date   WBC 7.3 07/21/2017   HGB 11.4 (L) 07/21/2017   HCT 34.5 (L) 07/21/2017   MCV 87.4 07/21/2017   PLT 282.0 07/21/2017   Will try ferrous fumarate - if that constipates her she is agreeable to parenteral iron  I appreciate the opportunity to care for her. [id number], Amy E, MD   Subjective:   Chief Complaint: iron-def anemia, hx colon polyps  HPI 72 yo ww here with dx irobn def anemia - low ferritin 05/2017 , w/o signs/sxs of any GI bleeding. Hgb 11.3 and ferritin 5.7 in March and was started on Ferrous sulfate but taking 1-2 x a week due to bad constipation. Had Afib ablation in 2018, followed by Dr. Rayann Heman and Dr. Jenna Luo - has done well. Maintained on eliquis for stroke prophylaxis. Colonoscopy 2005 w/ 2 diminutive polyps not pre-cancerous - no colonoscopy since. Does not or did not donate blood in > 10 yrs. She does eat green leafy vegetables but  not much red meat..  Allergies  Allergen Reactions  . Pravachol [Pravastatin Sodium]     Leg pain   . Contrast Media [Iodinated Diagnostic Agents] Rash  . Cortisone Hives and Rash   Current Meds  Medication Sig  . atorvastatin (LIPITOR) 10 MG tablet TAKE ONE TABLET BY MOUTH EVERY NIGHT AT BEDTIME  . Blood Glucose Monitoring Suppl (ONE TOUCH ULTRA 2) w/Device KIT USE AS DIRECTED E11.9  . Calcium Carb-Ergocalciferol 500-200 MG-UNIT TABS Take 1 tablet by mouth daily.   [name] diltiazem (CARDIZEM CD) 120 MG 24 hr capsule Take 1 capsule (120 mg total) by mouth daily.  [name] ELIQUIS 5 MG TABS tablet TAKE 1 TABLET BY MOUTH TWICE A DAY  . escitalopram (LEXAPRO) 10 MG tablet TAKE 1 TABLET BY MOUTH ONCE DAILY  . ferrous sulfate 325 (65 FE) MG tablet Take 325 mg by mouth every other day.  . flecainide (TAMBOCOR) 50 MG tablet Take 1 tablet (50 mg total) by mouth 2 (two) times daily. (Patient taking differently: Take 25 mg by mouth 2 (two) times daily. )  . glucose blood (ONE TOUCH ULTRA TEST) test strip CHECK BLOOD SUGAR 2 TIMES A DAY AS DIRECTED  Dx Code E11.9  . Insulin Glargine (LANTUS SOLOSTAR) 100 UNIT/ML Solostar Pen Using as instructed  . metFORMIN (GLUCOPHAGE-XR) 500 MG 24 hr tablet TAKE 4 TABLETS BY MOUTH IN THE EVENING WITH DINNER (Patient taking differently: 2  tablets in the morning, 2 tablets in the evening)  . Multiple Vitamin (MULTIVITAMIN) tablet Take 1 tablet by mouth daily.    . polyethylene glycol powder (GLYCOLAX/MIRALAX) powder Take 17 g by mouth daily.  . sennosides-docusate sodium (SENOKOT-S) 8.6-50 MG tablet Take 1 tablet by mouth daily as needed for constipation.  . solifenacin (VESICARE) 5 MG tablet Take 1 tablet (5 mg total) by mouth daily.  . TRADJENTA 5 MG TABS tablet TAKE 1 TABLET BY MOUTH EVERY DAY  . traZODone (DESYREL) 50 MG tablet Take 1-2 tablets (50-100 mg total) by mouth at bedtime as needed for sleep. (Patient taking differently: Take 50 mg by mouth at bedtime. )   Past  Medical History:  Diagnosis Date  . Anemia   . Anxiety   . Colon polyps   . Constipation   . Depressive disorder, not elsewhere classified   . Diaphragmatic hernia without mention of obstruction or gangrene   . Disorders of bursae and tendons in shoulder region, unspecified   . Diverticulosis of colon (without mention of hemorrhage)   . External hemorrhoid   . GERD (gastroesophageal reflux disease)   . History of hiatal hernia   . HTN (hypertension)   . Hypothyroidism   . Internal hemorrhoids without mention of complication   . Obesity, unspecified   . OSA on CPAP    compliant with CPAP  . Other urinary incontinence   . Persistent atrial fibrillation ([place])   . Pure hypercholesterolemia   . Type II diabetes mellitus ([place])   . Urinary frequency   . Urinary urgency   . Vitamin B 12 deficiency    Past Surgical History:  Procedure Laterality Date  . ABDOMINAL HYSTERECTOMY  1972   Have both ovaries  . ATRIAL FIBRILLATION ABLATION  12/17/2016  . ATRIAL FIBRILLATION ABLATION N/A 12/17/2016   Procedure: ATRIAL FIBRILLATION ABLATION;  Surgeon: Thompson Grayer, MD;  Location: [place] [facility];  Service: Cardiovascular;  Laterality: N/A;  . [place]   [facility]; "Dr. [name]"  . CARDIOVERSION N/A 05/27/2016   Procedure: CARDIOVERSION;  Surgeon: [name], MD;  Location: [facility];  Service: Cardiovascular;  Laterality: N/A;  . CARDIOVERSION N/A 11/25/2016   Procedure: CARDIOVERSION;  Surgeon: [name], MD;  Location: [facility];  Service: Cardiovascular;  Laterality: N/A;  . CATARACT EXTRACTION W/ INTRAOCULAR LENS  IMPLANT, BILATERAL Bilateral   . COLONOSCOPY    . ELECTROPHYSIOLOGIC STUDY N/A 01/24/2016   Procedure: CARDIOVERSION;  Surgeon: [name], MD;  Location: [facility];  Service: Cardiovascular;  Laterality: N/A;  . LAPAROSCOPIC CHOLECYSTECTOMY    . REDUCTION MAMMAPLASTY Bilateral   . TONSILLECTOMY     Social History   Social History  Narrative   Exercise walks 3 times daily   Lives with spouse in [place] - retired from employment   1 son and 1 daughter      Diet fruit, occ fast food, salads and lean meat      3 tea/day, no EtOH, no tobacco, no drugs   family history includes Cancer in her mother; Dementia in her mother; Diabetes in her brother and mother; Hyperlipidemia in her father and mother; Hypertension in her sister; Lung cancer in her father; Pulmonary embolism in her mother; Stroke in her paternal grandfather and paternal grandmother.   Review of Systems Anxious and depressed moods, excessive urination All else negative  Objective:   Physical Exam '@BP'  132/64 (BP Location: Left Arm, Patient Position: Sitting, Cuff Size:  Normal)   Pulse 64   Ht 5' 4.75" (1.645 m) Comment: height measured without shoes  Wt 180 lb 8 oz (81.9 kg)   LMP  (LMP Unknown)   BMI 30.27 kg/m @  General:  Well-developed, well-nourished and in no acute distress Eyes:  anicteric. ENT:   Mouth and posterior pharynx free of lesions.  Neck:   supple w/o thyromegaly or mass.  Lungs: Clear to auscultation bilaterally. Heart:  S1S2, no rubs, murmurs, gallops. Abdomen:  soft, non-tender, no hepatosplenomegaly, hernia, or mass and BS+.  Rectal: deferred Lymph:  no cervical or supraclavicular adenopathy. Extremities:   no edema, cyanosis or clubbing Skin   no rash. Neuro:  A&O x 3.  Psych:  appropriate mood and  Affect.   Data Reviewed: 2019 labs Prior endoscopy procedures PCP notes 2019 Endocrinology notes 2019 Cardiology notes 2019

## 2017-07-22 NOTE — Telephone Encounter (Signed)
Pt takes Eliquis for afib with CHADS2VASc score of 4 (age, sex, HTN, DM). Renal function is normal. Ok to hold Eliquis for 1-2 days prior to procedure as requested.

## 2017-07-22 NOTE — Telephone Encounter (Signed)
   Primary Cardiologist: Kathlyn Sacramento, MD  Chart reviewed as part of pre-operative protocol coverage. Patient was contacted 07/22/2017 in reference to pre-operative risk assessment for pending surgery as outlined below.  [name] was last seen on 06/30/17 by Dr. Rayann Heman.  Since that day, ILEANE King has done well with no new cardiac complaints.  Therefore, based on ACC/AHA guidelines, the patient would be at acceptable risk for the planned procedure without further cardiovascular testing.   Pt takes Eliquis for afib with CHADS2VASc score of 4 (age, sex, HTN, DM). Renal function is normal. Ok to hold Eliquis for 1-2 days prior to procedure as requested.  I will route this recommendation to the requesting party via Epic fax function and remove from pre-op pool.  Please call with questions.  Daune Perch, NP 07/22/2017, 5:34 PM

## 2017-07-23 NOTE — Telephone Encounter (Signed)
Per Dr Carlean Purl hold the Eliquis for 1 day since approved for 1-2 days. I informed [name] and she verbalized understanding.

## 2017-07-23 NOTE — Progress Notes (Signed)
Hgb sl low Ferritin still low Will try ferrous fumarate  Consider parenteral iron  My Chart note

## 2017-07-24 DIAGNOSIS — R69 Illness, unspecified: Secondary | ICD-10-CM | POA: Diagnosis not present

## 2017-07-25 ENCOUNTER — Encounter: Payer: Self-pay | Admitting: Internal Medicine

## 2017-07-25 ENCOUNTER — Ambulatory Visit: Payer: Medicare HMO | Admitting: Internal Medicine

## 2017-07-25 VITALS — BP 116/64 | HR 53 | Ht 64.75 in | Wt 176.2 lb

## 2017-07-25 DIAGNOSIS — E669 Obesity, unspecified: Secondary | ICD-10-CM

## 2017-07-25 DIAGNOSIS — E119 Type 2 diabetes mellitus without complications: Secondary | ICD-10-CM | POA: Diagnosis not present

## 2017-07-25 DIAGNOSIS — Z794 Long term (current) use of insulin: Secondary | ICD-10-CM

## 2017-07-25 DIAGNOSIS — Z6830 Body mass index (BMI) 30.0-30.9, adult: Secondary | ICD-10-CM

## 2017-07-25 DIAGNOSIS — E7800 Pure hypercholesterolemia, unspecified: Secondary | ICD-10-CM | POA: Diagnosis not present

## 2017-07-25 DIAGNOSIS — E663 Overweight: Secondary | ICD-10-CM

## 2017-07-25 LAB — POCT GLYCOSYLATED HEMOGLOBIN (HGB A1C): Hemoglobin A1C: 6.7 % — AB (ref 4.0–5.6)

## 2017-07-25 NOTE — Progress Notes (Signed)
Patient ID: [name], female   DOB: 08-Jun-1945, 72 y.o.   MRN: [id number]  HPI: [name] is a 72 y.o.-year-old female, returning for f/u for DM2, dx 1995, insulin-dependent since 2014 - now off insulin since 12/2016, with improved control, without long term complications. Last visit 4 months ago.  She continues to work with Dr. Leafy Ro and the weight loss center.   She just returned from a 5 days Cruise in [place] 3 weeks ago. Ate more >> sugars not much higher, just few spikes  Last hemoglobin A1c was: Lab Results  Component Value Date   HGBA1C 6.7 03/27/2017   HGBA1C 6.4 (H) 11/20/2016   HGBA1C 6.9 09/16/2016   Pt is on a regimen of: - Metformin ER 2000 mg with dinner - not working as well as 1000 mg 2x a day - Tradjenta 5 mg before lunch She was on Basaglar 12 >> 14 units in hs >> stopped at last visit - but still takes it seldom (10-12 units)  Pt checks her sugars 2X a day: - am: 90-120 >> 80-124 >> 100-120, 134 >> 97-120, 145 (cruise) - 2h after b'fast: 90-103 >> 113-141 >> n/c - before lunch:90-130 >> 100-125, 199 >> n/c - 2h after lunch:  160-180 >> 140-160s >> n/c - before dinner: n/c >> 102-144 >> n/c - 2h after dinner:  93-170, 191, 198 >> 130-150, 180, 200 >> 160-180, 195 (cruise) - bedtime: 120-160 >> 140-160 >> n/c - nighttime: n/c Lowest sugar was 100 >> 90; she has hypoglycemia awareness in the 80s. Highest sugar was 200 >> 195.  Pt's meals are: - Breakfast: cereals + 2% milk; bread + cheese; bread + PB - Lunch: sandwich; soup - Dinner: chicken; steak; seafood - Snacks: 2: PB crackers  -No CKD, last BUN/creatinine:  Lab Results  Component Value Date   BUN 14 05/26/2017   CREATININE 0.88 05/26/2017  She is not on an ACE inhibitor/ARB. -+ HL; last set of lipids: Lab Results  Component Value Date   CHOL 140 05/26/2017   HDL 51.60 05/26/2017   LDLCALC 78 05/26/2017   LDLDIRECT 163.1 07/04/2011   TRIG 50.0 05/26/2017   CHOLHDL 3 05/26/2017  On  Lipitor.   - last eye exam was in 05/2016: No DR - [name].  She had cataract surgery in 04 and 07/2016. -Denies numbness and tingling in her feet.  She has A. Fib >> had repeated cardioversion episodes. On Flecainide, Diltiazem, Eliquis.  ROS: Constitutional: no weight gain/no weight loss, no fatigue, no subjective hyperthermia, no subjective hypothermia, + nocturia Eyes: no blurry King, no xerophthalmia ENT: no sore throat, no nodules palpated in throat, no dysphagia, no odynophagia, no hoarseness Cardiovascular: no CP/no SOB/no palpitations/no leg swelling Respiratory: no cough/no SOB/no wheezing Gastrointestinal: no N/no V/no D/no C/no acid reflux Musculoskeletal: no muscle aches/+ joint aches Skin: no rashes, no hair loss Neurological: no tremors/no numbness/no tingling/no dizziness  I reviewed pt's medications, allergies, PMH, social hx, family hx, and changes were documented in the history of present illness. Otherwise, unchanged from my initial visit note. Started Vesicare.  Past Medical History:  Diagnosis Date  . Anemia   . Anxiety   . Colon polyps   . Constipation   . Depressive disorder, not elsewhere classified   . Diaphragmatic hernia without mention of obstruction or gangrene   . Disorders of bursae and tendons in shoulder region, unspecified   . Diverticulosis of colon (without mention of hemorrhage)   . External hemorrhoid   .  GERD (gastroesophageal reflux disease)   . History of hiatal hernia   . HTN (hypertension)   . Hypothyroidism   . Internal hemorrhoids without mention of complication   . Obesity, unspecified   . OSA on CPAP    compliant with CPAP  . Other urinary incontinence   . Persistent atrial fibrillation ([place])   . Pure hypercholesterolemia   . Type II diabetes mellitus ([place])   . Urinary frequency   . Urinary urgency   . Vitamin B 12 deficiency    Past Surgical History:  Procedure Laterality Date  . ABDOMINAL HYSTERECTOMY  1972    Have both ovaries  . ATRIAL FIBRILLATION ABLATION  12/17/2016  . ATRIAL FIBRILLATION ABLATION N/A 12/17/2016   Procedure: ATRIAL FIBRILLATION ABLATION;  Surgeon: Thompson Grayer, MD;  Location: [facility];  Service: Cardiovascular;  Laterality: N/A;  . [facility]; "Dr. [name]"  . CARDIOVERSION N/A 05/27/2016   Procedure: CARDIOVERSION;  Surgeon: [name], MD;  Location: [facility];  Service: Cardiovascular;  Laterality: N/A;  . CARDIOVERSION N/A 11/25/2016   Procedure: CARDIOVERSION;  Surgeon: [name], MD;  Location: [facility];  Service: Cardiovascular;  Laterality: N/A;  . CATARACT EXTRACTION W/ INTRAOCULAR LENS  IMPLANT, BILATERAL Bilateral   . COLONOSCOPY    . ELECTROPHYSIOLOGIC STUDY N/A 01/24/2016   Procedure: CARDIOVERSION;  Surgeon: [name], MD;  Location: [facility];  Service: Cardiovascular;  Laterality: N/A;  . LAPAROSCOPIC CHOLECYSTECTOMY    . REDUCTION MAMMAPLASTY Bilateral   . TONSILLECTOMY     Social History   Socioeconomic History  . Marital status: Married    Spouse name: [name]  . Number of children: 2  . Years of education: Not on file  . Highest education level: Not on file  Occupational History  . Occupation: retired    Fish farm manager: UNEMPLOYED  Social Needs  . Financial resource strain: Not on file  . Food insecurity:    Worry: Not on file    Inability: Not on file  . Transportation needs:    Medical: Not on file    Non-medical: Not on file  Tobacco Use  . Smoking status: Never Smoker  . Smokeless tobacco: Never Used  Substance and Sexual Activity  . Alcohol use: No    Alcohol/week: 0.0 oz  . Drug use: No  . Sexual activity: Yes  Lifestyle  . Physical activity:    Days per week: Not on file    Minutes per session: Not on file  . Stress: Not on file  Relationships  . Social connections:    Talks on phone: Not on file    Gets together: Not on file    Attends religious service: Not on file    Active  member of club or organization: Not on file    Attends meetings of clubs or organizations: Not on file    Relationship status: Not on file  . Intimate partner violence:    Fear of current or ex partner: Not on file    Emotionally abused: Not on file    Physically abused: Not on file    Forced sexual activity: Not on file  Other Topics Concern  . Not on file  Social History Narrative   Exercise walks 3 times daily   Lives with spouse in [name]   homemaker      Diet fruit, occ fast food, salads and lean meat   Current Outpatient Medications on File Prior to Visit  Medication Sig Dispense Refill  . atorvastatin (LIPITOR) 10 MG tablet TAKE ONE TABLET BY MOUTH EVERY NIGHT AT BEDTIME 90 tablet 3  . Blood Glucose Monitoring Suppl (ONE TOUCH ULTRA 2) w/Device KIT USE AS DIRECTED E11.9  0  . Calcium Carb-Ergocalciferol 500-200 MG-UNIT TABS Take 1 tablet by mouth daily.     [name] diltiazem (CARDIZEM CD) 120 MG 24 hr capsule Take 1 capsule (120 mg total) by mouth daily. 90 capsule 3  . ELIQUIS 5 MG TABS tablet TAKE 1 TABLET BY MOUTH TWICE A DAY 60 tablet 3  . escitalopram (LEXAPRO) 10 MG tablet TAKE 1 TABLET BY MOUTH ONCE DAILY 90 tablet 0  . ferrous sulfate 325 (65 FE) MG tablet Take 325 mg by mouth every other day.    . flecainide (TAMBOCOR) 50 MG tablet Take 1 tablet (50 mg total) by mouth 2 (two) times daily. (Patient taking differently: Take 25 mg by mouth 2 (two) times daily. ) 180 tablet 2  . glucose blood (ONE TOUCH ULTRA TEST) test strip CHECK BLOOD SUGAR 2 TIMES A DAY AS DIRECTED  Dx Code E11.9 200 each 3  . Insulin Glargine (LANTUS SOLOSTAR) 100 UNIT/ML Solostar Pen Using as instructed    . metFORMIN (GLUCOPHAGE-XR) 500 MG 24 hr tablet TAKE 4 TABLETS BY MOUTH IN THE EVENING WITH DINNER (Patient taking differently: 2 tablets in the morning, 2 tablets in the evening) 360 tablet 2  . Multiple Vitamin (MULTIVITAMIN) tablet Take 1 tablet by mouth daily.      . polyethylene glycol powder  (GLYCOLAX/MIRALAX) powder Take 17 g by mouth daily. 3350 g 0  . sennosides-docusate sodium (SENOKOT-S) 8.6-50 MG tablet Take 1 tablet by mouth daily as needed for constipation.    . solifenacin (VESICARE) 5 MG tablet Take 1 tablet (5 mg total) by mouth daily. 30 tablet 3  . TRADJENTA 5 MG TABS tablet TAKE 1 TABLET BY MOUTH EVERY DAY 30 tablet 1  . traZODone (DESYREL) 50 MG tablet Take 1-2 tablets (50-100 mg total) by mouth at bedtime as needed for sleep. (Patient taking differently: Take 50 mg by mouth at bedtime. ) 60 tablet 5   No current facility-administered medications on file prior to visit.    Allergies  Allergen Reactions  . Pravachol [Pravastatin Sodium]     Leg pain   . Contrast Media [Iodinated Diagnostic Agents] Rash  . Cortisone Hives and Rash   Family History  Problem Relation Age of Onset  . Cancer Mother        fullopian tube  . Dementia Mother   . Pulmonary embolism Mother   . Hyperlipidemia Mother   . Diabetes Mother   . Hyperlipidemia Father   . Lung cancer Father   . Hypertension Sister   . Diabetes Brother   . Stroke Paternal Grandmother   . Stroke Paternal Grandfather     PE: BP 116/64   Pulse (!) 53   Ht 5' 4.75" (1.645 m)   Wt 176 lb 3.2 oz (79.9 kg)   LMP  (LMP Unknown)   SpO2 97%   BMI 29.55 kg/m  Body mass index is 29.55 kg/m. Wt Readings from Last 3 Encounters:  07/25/17 176 lb 3.2 oz (79.9 kg)  07/21/17 180 lb 8 oz (81.9 kg)  07/15/17 177 lb (80.3 kg)   Constitutional: overweight, in NAD Eyes: PERRLA, EOMI, no exophthalmos ENT: moist mucous membranes, no thyromegaly, no cervical lymphadenopathy Cardiovascular: bradycardia, RR, No MRG Respiratory: CTA B Gastrointestinal: abdomen soft, NT, ND, BS+  Musculoskeletal: no deformities, strength intact in all 4 Skin: moist, warm, no rashes Neurological: no tremor with outstretched hands, DTR normal in all 4  ASSESSMENT: 1. DM2, previously insulin-dependent, controlled, without long term  complications, but with hyperglycemia - she was contemplating gastric sleeve sx >> on hold now  2. Overweight  3. Hyperlipidemia  PLAN:  1. Patient with long-standing, now controlled, type 2 diabetes, initially insulin-dependent, now off insulin after she started to adjust her diet and lose weight.  She continues on metformin and Tradjenta, which she tolerates without side effects.  Sugars were a little bit higher after dinner at last visit and we discussed about possibly moving the entire dose of metformin ER with dinner - sugars are at goal in am but higher after dinner after her cruise >> discussed about improving this meal. - She feels her sugars were to be better when she was taking the metformin and 2 different doses, with breakfast and with dinner, so was split the dose back to this regimen - I suggested to:  Patient Instructions  Please change: - Metformin ER to 1000 mg 2x a day with meals  Please continue: - Tradjenta 5 mg before lunch  Please come back for a follow-up appointment in 4 months.   - today, HbA1c is 6.7% (stable) - continue checking sugars at different times of the day - check 1x a day, rotating checks - advised for yearly eye exams >> she is UTD - Return to clinic in 4 mo with sugar log    2. Overweight -Seen by Dr. Leafy Ro in the weight loss center -She had a little setback with the holidays, and she was not able to lose a significant amount of weight since then; now, after her cruise, her weight increased, which she was able to lose 4 pounds in the last few days  3.  Hyperlipidemia - Reviewed latest lipid panel from 05/2017 18: LDL impressively improved, now at goal - Continues Lipitor without side effects.  [name], MD PhD [facility]

## 2017-07-25 NOTE — Patient Instructions (Addendum)
Patient Instructions  Please change: - Metformin ER to 1000 mg 2x a day with meals  Please continue: - Tradjenta 5 mg before lunch  Please come back for a follow-up appointment in 4 months.

## 2017-07-27 ENCOUNTER — Encounter: Payer: Self-pay | Admitting: Internal Medicine

## 2017-07-30 DIAGNOSIS — R69 Illness, unspecified: Secondary | ICD-10-CM | POA: Diagnosis not present

## 2017-08-09 ENCOUNTER — Other Ambulatory Visit: Payer: Self-pay | Admitting: Internal Medicine

## 2017-08-19 ENCOUNTER — Encounter: Payer: Self-pay | Admitting: Internal Medicine

## 2017-08-21 DIAGNOSIS — R69 Illness, unspecified: Secondary | ICD-10-CM | POA: Diagnosis not present

## 2017-08-22 ENCOUNTER — Other Ambulatory Visit: Payer: Self-pay | Admitting: Family Medicine

## 2017-09-02 ENCOUNTER — Other Ambulatory Visit: Payer: Self-pay

## 2017-09-02 ENCOUNTER — Ambulatory Visit (AMBULATORY_SURGERY_CENTER): Payer: Medicare HMO | Admitting: Internal Medicine

## 2017-09-02 ENCOUNTER — Encounter: Payer: Self-pay | Admitting: Internal Medicine

## 2017-09-02 VITALS — BP 152/72 | HR 61 | Temp 98.7°F | Resp 18 | Ht 64.75 in | Wt 176.0 lb

## 2017-09-02 DIAGNOSIS — I4891 Unspecified atrial fibrillation: Secondary | ICD-10-CM | POA: Diagnosis not present

## 2017-09-02 DIAGNOSIS — K449 Diaphragmatic hernia without obstruction or gangrene: Secondary | ICD-10-CM | POA: Diagnosis not present

## 2017-09-02 DIAGNOSIS — D509 Iron deficiency anemia, unspecified: Secondary | ICD-10-CM | POA: Diagnosis not present

## 2017-09-02 DIAGNOSIS — K317 Polyp of stomach and duodenum: Secondary | ICD-10-CM

## 2017-09-02 DIAGNOSIS — E119 Type 2 diabetes mellitus without complications: Secondary | ICD-10-CM | POA: Diagnosis not present

## 2017-09-02 DIAGNOSIS — G4733 Obstructive sleep apnea (adult) (pediatric): Secondary | ICD-10-CM | POA: Diagnosis not present

## 2017-09-02 MED ORDER — SODIUM CHLORIDE 0.9 % IV SOLN: 500.0000 mL | Freq: Once | INTRAVENOUS | Status: DC

## 2017-09-02 NOTE — Progress Notes (Signed)
Called to room to assist during endoscopic procedure.  Patient ID and intended procedure confirmed with present staff. Received instructions for my participation in the procedure from the performing physician.  

## 2017-09-02 NOTE — Progress Notes (Signed)
Report to PACU, RN, vss, BBS= Clear.  

## 2017-09-02 NOTE — Op Note (Signed)
[facility] Patient Name: [name] Procedure Date: 09/02/2017 2:50 PM MRN: [id number] Endoscopist: Gatha Mayer , MD Age: 72 Referring MD:  Date of Birth: November 03, 1945 Gender: Female Account #: [id number] Procedure:                Colonoscopy Indications:              Iron deficiency anemia Medicines:                Propofol per Anesthesia, Monitored Anesthesia Care Procedure:                Pre-Anesthesia Assessment:                           - Prior to the procedure, a History and Physical                            was performed, and patient medications and                            allergies were reviewed. The patient's tolerance of                            previous anesthesia was also reviewed. The risks                            and benefits of the procedure and the sedation                            options and risks were discussed with the patient.                            All questions were answered, and informed consent                            was obtained. Prior Anticoagulants: The patient                            last took Eliquis (apixaban) 2 days prior to the                            procedure. ASA Grade Assessment: II - A patient                            with mild systemic disease. After reviewing the                            risks and benefits, the patient was deemed in                            satisfactory condition to undergo the procedure.                           After obtaining informed consent, the colonoscope  was passed under direct vision. Throughout the                            procedure, the patient's blood pressure, pulse, and                            oxygen saturations were monitored continuously. The                            Colonoscope was introduced through the anus and                            advanced to the the cecum, identified by                            appendiceal orifice and  ileocecal valve. The                            ileocecal valve, appendiceal orifice, and rectum                            were photographed. The quality of the bowel                            preparation was excellent. The bowel preparation                            used was Miralax. Scope In: 3:11:42 PM Scope Out: 3:27:29 PM Scope Withdrawal Time: 0 hours 7 minutes 45 seconds  Total Procedure Duration: 0 hours 15 minutes 47 seconds  Findings:                 The perianal and digital rectal examinations were                            normal.                           Multiple diverticula were found in the sigmoid                            colon.                           Anal papilla(e) were hypertrophied.                           The exam was otherwise without abnormality on                            direct and retroflexion views. Complications:            No immediate complications. Estimated blood loss:                            None. Estimated Blood Loss:     Estimated blood loss: none. Impression:               -  Moderate diverticulosis in the sigmoid colon.                           - Anal papilla(e) were hypertrophied.                           - The examination was otherwise normal on direct                            and retroflexion views.                           - No specimens collected. Recommendation:           - Resume previous diet.                           - Continue present medications.                           - No repeat colonoscopy due to age.                           - Resume previous diet.                           - Continue present medications.                           - Resume Eliquis (apixaban) at prior dose today.                           - Stay on ferrous fumarate - if unable to tolerate                            then try parenteral iron                           Awaoit celiac bxs from EGD                           If negative would see  how she does w/ iron                            supplementation - if fails to hold Hgb that way                            then would check small bowel capsule endoscopy Gatha Mayer, MD 09/02/2017 3:38:45 PM This report has been signed electronically.

## 2017-09-02 NOTE — Op Note (Signed)
[name] Patient Name: [name] Procedure Date: 09/02/2017 2:51 PM MRN: [id number] Endoscopist: Gatha Mayer , MD Age: 72 Referring MD:  Date of Birth: 20-Jun-1945 Gender: Female Account #: [id number] Procedure:                Upper GI endoscopy Indications:              Iron deficiency anemia Medicines:                Propofol per Anesthesia, Monitored Anesthesia Care Procedure:                Pre-Anesthesia Assessment:                           - Prior to the procedure, a History and Physical                            was performed, and patient medications and                            allergies were reviewed. The patient's tolerance of                            previous anesthesia was also reviewed. The risks                            and benefits of the procedure and the sedation                            options and risks were discussed with the patient.                            All questions were answered, and informed consent                            was obtained. Prior Anticoagulants: The patient                            last took Eliquis (apixaban) 2 days prior to the                            procedure. ASA Grade Assessment: II - A patient                            with mild systemic disease. After reviewing the                            risks and benefits, the patient was deemed in                            satisfactory condition to undergo the procedure.                           After obtaining informed consent, the endoscope was  passed under direct vision. Throughout the                            procedure, the patient's blood pressure, pulse, and                            oxygen saturations were monitored continuously. The                            Endoscope was introduced through the mouth, and                            advanced to the second part of duodenum. The upper                            GI  endoscopy was accomplished without difficulty.                            The patient tolerated the procedure well. Scope In: Scope Out: Findings:                 Multiple 3 to 10 mm semi-sessile polyps with no                            stigmata of recent bleeding were found in the                            gastric fundus and in the gastric body. Biopsies                            were taken with a cold forceps for histology.                            Verification of patient identification for the                            specimen was done. Estimated blood loss was minimal.                           The exam was otherwise without abnormality.                           The cardia and gastric fundus were normal on                            retroflexion.                           Biopsies for histology were taken with a cold                            forceps in the entire duodenum for evaluation of  celiac disease. Verification of patient                            identification for the specimen was done. Estimated                            blood loss was minimal. Complications:            No immediate complications. Estimated Blood Loss:     Estimated blood loss was minimal. Impression:               - Multiple gastric polyps. Biopsied.                           - The examination was otherwise normal.                           - Biopsies were taken with a cold forceps for                            evaluation of celiac disease. Recommendation:           - Patient has a contact number available for                            emergencies. The signs and symptoms of potential                            delayed complications were discussed with the                            patient. Return to normal activities tomorrow.                            Written discharge instructions were provided to the                            patient.                            - Resume previous diet.                           - Continue present medications.                           - See the other procedure note for documentation of                            additional recommendations. Gatha Mayer, MD 09/02/2017 3:33:51 PM This report has been signed electronically.

## 2017-09-02 NOTE — Patient Instructions (Addendum)
Nothing bad seen - no abnormalities that would bleed and cause iron dficiency.  You do have some benign-appearing stomach polyps - common and not usually a problem - I took biopsies.  I took biopsies of the small intestine to see if you might not be absorbing iron correctly.  I will let you know.  Restart Eliquis today.  I appreciate the opportunity to care for you. Gatha Mayer, MD, [facility]  Handouts:  Diverticulosis  YOU HAD AN ENDOSCOPIC PROCEDURE TODAY AT [facility]:   Refer to the procedure report that was given to you for any specific questions about what was found during the examination.  If the procedure report does not answer your questions, please call your gastroenterologist to clarify.  If you requested that your care partner not be given the details of your procedure findings, then the procedure report has been included in a sealed envelope for you to review at your convenience later.  YOU SHOULD EXPECT: Some feelings of bloating in the abdomen. Passage of more gas than usual.  Walking can help get rid of the air that was put into your GI tract during the procedure and reduce the bloating. If you had a lower endoscopy (such as a colonoscopy or flexible sigmoidoscopy) you may notice spotting of blood in your stool or on the toilet paper. If you underwent a bowel prep for your procedure, you may not have a normal bowel movement for a few days.  Please Note:  You might notice some irritation and congestion in your nose or some drainage.  This is from the oxygen used during your procedure.  There is no need for concern and it should clear up in a day or so.  SYMPTOMS TO REPORT IMMEDIATELY:   Following lower endoscopy (colonoscopy or flexible sigmoidoscopy):  Excessive amounts of blood in the stool  Significant tenderness or worsening of abdominal pains  Swelling of the abdomen that is new, acute  Fever of 100F or higher   Following upper endoscopy  (EGD)  Vomiting of blood or coffee ground material  New chest pain or pain under the shoulder blades  Painful or persistently difficult swallowing  New shortness of breath  Fever of 100F or higher  Black, tarry-looking stools  For urgent or emergent issues, a gastroenterologist can be reached at any hour by calling [phone].   DIET:  We do recommend a small meal at first, but then you may proceed to your regular diet.  Drink plenty of fluids but you should avoid alcoholic beverages for 24 hours.  ACTIVITY:  You should plan to take it easy for the rest of today and you should NOT DRIVE or use heavy machinery until tomorrow (because of the sedation medicines used during the test).    FOLLOW UP: Our staff will call the number listed on your records the next business day following your procedure to check on you and address any questions or concerns that you may have regarding the information given to you following your procedure. If we do not reach you, we will leave a message.  However, if you are feeling well and you are not experiencing any problems, there is no need to return our call.  We will assume that you have returned to your regular daily activities without incident.  If any biopsies were taken you will be contacted by phone or by letter within the next 1-3 weeks.  Please call us at [phone] if you have  not heard about the biopsies in 3 weeks.    SIGNATURES/CONFIDENTIALITY: You and/or your care partner have signed paperwork which will be entered into your electronic medical record.  These signatures attest to the fact that that the information above on your After Visit Summary has been reviewed and is understood.  Full responsibility of the confidentiality of this discharge information lies with you and/or your care-partner.

## 2017-09-03 ENCOUNTER — Telehealth: Payer: Self-pay

## 2017-09-03 NOTE — Telephone Encounter (Signed)
  Follow up Call-  Call back number 09/02/2017  Post procedure Call Back phone  # [phone]  Permission to leave phone message Yes  Some recent data might be hidden     Patient questions:  Do you have a fever, pain , or abdominal swelling? No. Pain Score  0 *  Have you tolerated food without any problems? Yes.    Have you been able to return to your normal activities? Yes.    Do you have any questions about your discharge instructions: Diet   No. Medications  No. Follow up visit  No.  Do you have questions or concerns about your Care? No.  Actions: * If pain score is 4 or above: No action needed, pain <4.

## 2017-09-08 ENCOUNTER — Other Ambulatory Visit: Payer: Self-pay | Admitting: Cardiovascular Disease

## 2017-09-08 NOTE — Telephone Encounter (Signed)
Please review for refill, Thanks !  

## 2017-09-12 ENCOUNTER — Encounter: Payer: Self-pay | Admitting: Internal Medicine

## 2017-09-12 ENCOUNTER — Other Ambulatory Visit: Payer: Self-pay

## 2017-09-12 DIAGNOSIS — D509 Iron deficiency anemia, unspecified: Secondary | ICD-10-CM

## 2017-09-12 NOTE — Progress Notes (Signed)
Call patient  Biopsies are ok - no celiac dz and polyps innocent - no f/u needed  [name] - have her get a CBC and ferritin anytime now dx iron def anemia  [name] - no recall/letter but cc PCP

## 2017-09-19 ENCOUNTER — Other Ambulatory Visit (INDEPENDENT_AMBULATORY_CARE_PROVIDER_SITE_OTHER): Payer: Medicare HMO

## 2017-09-19 DIAGNOSIS — D509 Iron deficiency anemia, unspecified: Secondary | ICD-10-CM | POA: Diagnosis not present

## 2017-09-19 LAB — CBC WITH DIFFERENTIAL/PLATELET
Basophils Absolute: 0 10*3/uL (ref 0.0–0.1)
Basophils Relative: 0.3 % (ref 0.0–3.0)
EOS PCT: 0.9 % (ref 0.0–5.0)
Eosinophils Absolute: 0.1 10*3/uL (ref 0.0–0.7)
HEMATOCRIT: 34.8 % — AB (ref 36.0–46.0)
HEMOGLOBIN: 11.6 g/dL — AB (ref 12.0–15.0)
LYMPHS PCT: 23.3 % (ref 12.0–46.0)
Lymphs Abs: 1.8 10*3/uL (ref 0.7–4.0)
MCHC: 33.3 g/dL (ref 30.0–36.0)
MCV: 86.7 fl (ref 78.0–100.0)
MONOS PCT: 6.3 % (ref 3.0–12.0)
Monocytes Absolute: 0.5 10*3/uL (ref 0.1–1.0)
Neutro Abs: 5.4 10*3/uL (ref 1.4–7.7)
Neutrophils Relative %: 69.2 % (ref 43.0–77.0)
Platelets: 279 10*3/uL (ref 150.0–400.0)
RBC: 4.02 Mil/uL (ref 3.87–5.11)
RDW: 14.8 % (ref 11.5–15.5)
WBC: 7.8 10*3/uL (ref 4.0–10.5)

## 2017-09-19 LAB — FERRITIN: Ferritin: 6.9 ng/mL — ABNORMAL LOW (ref 10.0–291.0)

## 2017-09-21 ENCOUNTER — Other Ambulatory Visit: Payer: Self-pay | Admitting: Internal Medicine

## 2017-09-23 NOTE — Progress Notes (Signed)
Stable mild anemia and persistently low ferritin ? Tolerating oral iron My Chart message  Consider capsule endo

## 2017-09-25 ENCOUNTER — Other Ambulatory Visit: Payer: Self-pay | Admitting: Family Medicine

## 2017-09-26 ENCOUNTER — Encounter: Payer: Self-pay | Admitting: Internal Medicine

## 2017-09-26 ENCOUNTER — Other Ambulatory Visit: Payer: Self-pay | Admitting: Family Medicine

## 2017-09-26 NOTE — Telephone Encounter (Signed)
Left message on VM per DPR asking pt if she has tried anything else.  PEC, if pt calls back, please find out what she has taken for overactive bladder.

## 2017-09-26 NOTE — Telephone Encounter (Signed)
Call pt what has she tried in past?

## 2017-09-26 NOTE — Telephone Encounter (Signed)
Note from pharmacy states:  Alternative Requested:PRODUCT NOT COVERED... PLEASE SEND ALTERNATIVE.

## 2017-10-01 ENCOUNTER — Other Ambulatory Visit: Payer: Self-pay | Admitting: Family Medicine

## 2017-10-01 MED ORDER — FESOTERODINE FUMARATE ER 4 MG PO TB24: 4.0000 mg | ORAL_TABLET | Freq: Every day | ORAL | 11 refills | Status: DC

## 2017-10-01 NOTE — Telephone Encounter (Signed)
Spoke with [name].  She is currently on Vesicare.  The alternative request is Toviaz.  Patient is willing to try the [place].  Ok to send in Rx?

## 2017-10-06 ENCOUNTER — Encounter: Payer: Self-pay | Admitting: Internal Medicine

## 2017-10-06 ENCOUNTER — Ambulatory Visit: Payer: Medicare HMO | Admitting: Internal Medicine

## 2017-10-06 VITALS — BP 124/68 | HR 56 | Ht 64.5 in | Wt 177.8 lb

## 2017-10-06 DIAGNOSIS — I481 Persistent atrial fibrillation: Secondary | ICD-10-CM

## 2017-10-06 DIAGNOSIS — I10 Essential (primary) hypertension: Secondary | ICD-10-CM | POA: Diagnosis not present

## 2017-10-06 DIAGNOSIS — G4733 Obstructive sleep apnea (adult) (pediatric): Secondary | ICD-10-CM

## 2017-10-06 DIAGNOSIS — I340 Nonrheumatic mitral (valve) insufficiency: Secondary | ICD-10-CM | POA: Diagnosis not present

## 2017-10-06 DIAGNOSIS — I4819 Other persistent atrial fibrillation: Secondary | ICD-10-CM

## 2017-10-06 NOTE — Patient Instructions (Addendum)
Medication Instructions:  Your physician recommends that you continue on your current medications as directed. Please refer to the Current Medication list given to you today.  Labwork: None ordered.  Testing/Procedures: Your physician has requested that you have an echocardiogram. Echocardiography is a painless test that uses sound waves to create images of your heart. It provides your doctor with information about the size and shape of your heart and how well your heart's chambers and valves are working. This procedure takes approximately one hour. There are no restrictions for this procedure.  Please schedule for an ECHO in [place]: Your physician wants you to follow-up in: 6 months with Dr. Rayann Heman.     Any Other Special Instructions Will Be Listed Below (If Applicable).  If you need a refill on your cardiac medications before your next appointment, please call your pharmacy.

## 2017-10-06 NOTE — Progress Notes (Signed)
  PCP: Bedsole, Amy E, MD Primary Cardiologist: Dr Arida Primary EP: Dr Allred  [name] is a 72 y.o. female who presents today for routine electrophysiology followup.  Since last being seen in our clinic, the patient reports doing very well.  No afib.  Pleased with results of ablation. Today, she denies symptoms of palpitations, chest pain, shortness of breath,  lower extremity edema, dizziness, presyncope, or syncope.  The patient is otherwise without complaint today.   Past Medical History:  Diagnosis Date  . Anemia   . Anxiety   . Colon polyps   . Constipation   . Depressive disorder, not elsewhere classified   . Diaphragmatic hernia without mention of obstruction or gangrene   . Disorders of bursae and tendons in shoulder region, unspecified   . Diverticulosis of colon (without mention of hemorrhage)   . External hemorrhoid   . Fundic gland polyps of stomach, benign   . GERD (gastroesophageal reflux disease)   . History of hiatal hernia   . HTN (hypertension)   . Hypothyroidism   . Internal hemorrhoids without mention of complication   . Obesity, unspecified   . OSA on CPAP    compliant with CPAP  . Other urinary incontinence   . Persistent atrial fibrillation (HCC)   . Pure hypercholesterolemia   . Type II diabetes mellitus (HCC)   . Urinary frequency   . Urinary urgency   . Vitamin B 12 deficiency    Past Surgical History:  Procedure Laterality Date  . ABDOMINAL HYSTERECTOMY  1972   Have both ovaries  . ATRIAL FIBRILLATION ABLATION  12/17/2016  . ATRIAL FIBRILLATION ABLATION N/A 12/17/2016   Procedure: ATRIAL FIBRILLATION ABLATION;  Surgeon: Allred, James, MD;  Location: [facility];  Service: Cardiovascular;  Laterality: N/A;  . CARDIAC CATHETERIZATION  1990s   [facility]; "Dr. Little"  . CARDIOVERSION N/A 05/27/2016   Procedure: CARDIOVERSION;  Surgeon: Muhammad A Arida, MD;  Location: [facility] ORS;  Service: Cardiovascular;  Laterality: N/A;  . CARDIOVERSION  N/A 11/25/2016   Procedure: CARDIOVERSION;  Surgeon: Arida, Muhammad A, MD;  Location: [facility] ORS;  Service: Cardiovascular;  Laterality: N/A;  . CATARACT EXTRACTION W/ INTRAOCULAR LENS  IMPLANT, BILATERAL Bilateral   . COLONOSCOPY    . ELECTROPHYSIOLOGIC STUDY N/A 01/24/2016   Procedure: CARDIOVERSION;  Surgeon: Timothy J Gollan, MD;  Location: [facility] ORS;  Service: Cardiovascular;  Laterality: N/A;  . LAPAROSCOPIC CHOLECYSTECTOMY    . REDUCTION MAMMAPLASTY Bilateral   . TONSILLECTOMY      ROS- all systems are reviewed and negatives except as per HPI above  Current Outpatient Medications  Medication Sig Dispense Refill  . atorvastatin (LIPITOR) 10 MG tablet TAKE ONE TABLET BY MOUTH EVERY NIGHT AT BEDTIME 90 tablet 3  . Blood Glucose Monitoring Suppl (ONE TOUCH ULTRA 2) w/Device KIT USE AS DIRECTED E11.9  0  . Calcium Carb-Ergocalciferol 500-200 MG-UNIT TABS Take 1 tablet by mouth daily.     . diltiazem (CARDIZEM CD) 120 MG 24 hr capsule Take 1 capsule (120 mg total) by mouth daily. 90 capsule 3  . ELIQUIS 5 MG TABS tablet TAKE 1 TABLET BY MOUTH TWICE A DAY 60 tablet 5  . escitalopram (LEXAPRO) 10 MG tablet TAKE 1 TABLET BY MOUTH ONCE DAILY 90 tablet 1  . ferrous sulfate 325 (65 FE) MG tablet Take 325 mg by mouth every other day.    . fesoterodine (TOVIAZ) 4 MG TB24 tablet Take 1 tablet (4 mg total) by mouth   daily. 30 tablet 11  . flecainide (TAMBOCOR) 50 MG tablet Take 50 mg by mouth 2 (two) times daily.    . glucose blood (ONE TOUCH ULTRA TEST) test strip CHECK BLOOD SUGAR 2 TIMES A DAY AS DIRECTED  Dx Code E11.9 200 each 3  . Insulin Glargine (LANTUS SOLOSTAR) 100 UNIT/ML Solostar Pen Using as instructed    . metFORMIN (GLUCOPHAGE-XR) 500 MG 24 hr tablet TAKE 4 TABLETS BY MOUTH IN THE EVENING WITH DINNER 360 tablet 0  . Multiple Vitamin (MULTIVITAMIN) tablet Take 1 tablet by mouth daily.      . sennosides-docusate sodium (SENOKOT-S) 8.6-50 MG tablet Take 1 tablet by mouth daily as needed  for constipation.    . TRADJENTA 5 MG TABS tablet TAKE 1 TABLET BY MOUTH EVERY DAY 30 tablet 1  . traZODone (DESYREL) 50 MG tablet Take 50 mg by mouth at bedtime.     Current Facility-Administered Medications  Medication Dose Route Frequency Provider Last Rate Last Dose  . 0.9 %  sodium chloride infusion  500 mL Intravenous Once Gessner, Carl E, MD        Physical Exam: Vitals:   10/06/17 0927  BP: 124/68  Pulse: (!) 56  SpO2: 97%  Weight: 177 lb 12.8 oz (80.6 kg)  Height: 5' 4.5" (1.638 m)    GEN- The patient is well appearing, alert and oriented x 3 today.   Head- normocephalic, atraumatic Eyes-  Sclera clear, conjunctiva pink Ears- hearing intact Oropharynx- clear Lungs- Clear to ausculation bilaterally, normal work of breathing Heart- Regular rate and rhythm, no murmurs, rubs or gallops, PMI not laterally displaced GI- soft, NT, ND, + BS Extremities- no clubbing, cyanosis, or edema  Wt Readings from Last 3 Encounters:  10/06/17 177 lb 12.8 oz (80.6 kg)  09/02/17 176 lb (79.8 kg)  07/25/17 176 lb 3.2 oz (79.9 kg)    EKG tracing ordered today is personally reviewed and shows sinus rhythm 56 bpm, PR 206 msec, QRS 100 msec, QTc 411 msec  Assessment and Plan:  1. Persistent afib Well controlled post ablation off AAD therapy She is reluctant to stop flecainide.  I have advised that she can wean off of this medicine anytime if she is willing chads2vasc score is 4.  Continue eliquis  2. HTN Stable No change required today  3. Mild to moderate MR Echo 4/18 reviewed Repeat echo at this time  4. OSA Compliance with CPAP encouraged  Return to see me in 6 months  James Allred MD, FACC 10/06/2017 9:54 AM    

## 2017-10-07 ENCOUNTER — Telehealth: Payer: Self-pay

## 2017-10-07 ENCOUNTER — Other Ambulatory Visit: Payer: Self-pay

## 2017-10-07 DIAGNOSIS — D509 Iron deficiency anemia, unspecified: Secondary | ICD-10-CM

## 2017-10-07 NOTE — Telephone Encounter (Signed)
-----   Message from Gatha Mayer, MD sent at 10/06/2017  6:32 PM EDT ----- Regarding: feraheme x 2 Please arrange feraheme x 2  CBC and ferritin 1 month after second feraheme  Dx iron def anemia from chronic blood loss

## 2017-10-07 NOTE — Telephone Encounter (Signed)
Orders placed.

## 2017-10-07 NOTE — Telephone Encounter (Signed)
Patient notified of feraheme infusion scheduled for 10/16/17 10:00.  She is advised to arrive at 9:40 to register at [facility] admitting. She is aware they will schedule the second dose while she is there. New lab remider set for 1 month after second dose

## 2017-10-10 ENCOUNTER — Other Ambulatory Visit: Payer: Self-pay

## 2017-10-10 ENCOUNTER — Ambulatory Visit (INDEPENDENT_AMBULATORY_CARE_PROVIDER_SITE_OTHER): Payer: Medicare HMO

## 2017-10-10 DIAGNOSIS — I340 Nonrheumatic mitral (valve) insufficiency: Secondary | ICD-10-CM | POA: Diagnosis not present

## 2017-10-11 ENCOUNTER — Other Ambulatory Visit: Payer: Self-pay | Admitting: Pulmonary Disease

## 2017-10-11 ENCOUNTER — Other Ambulatory Visit: Payer: Self-pay | Admitting: Internal Medicine

## 2017-10-16 ENCOUNTER — Ambulatory Visit (HOSPITAL_COMMUNITY)
Admission: RE | Admit: 2017-10-16 | Discharge: 2017-10-16 | Disposition: A | Discharge status: Home / Self Care | Payer: Medicare HMO | Source: Ambulatory Visit | Attending: Internal Medicine | Admitting: Internal Medicine

## 2017-10-16 DIAGNOSIS — D509 Iron deficiency anemia, unspecified: Secondary | ICD-10-CM | POA: Diagnosis present

## 2017-10-16 MED ORDER — SODIUM CHLORIDE 0.9 % IV SOLN
510.0000 mg | INTRAVENOUS | Status: DC
  Administered 2017-10-16: 510 mg via INTRAVENOUS
  Filled 2017-10-16: qty 17

## 2017-10-16 NOTE — Discharge Instructions (Signed)

## 2017-10-20 ENCOUNTER — Inpatient Hospital Stay (HOSPITAL_COMMUNITY): Admission: RE | Admit: 2017-10-20 | Payer: Medicare HMO | Source: Ambulatory Visit

## 2017-10-23 ENCOUNTER — Encounter (HOSPITAL_COMMUNITY)
Admission: RE | Admit: 2017-10-23 | Discharge: 2017-10-23 | Disposition: A | Discharge status: Home / Self Care | Payer: Medicare HMO | Source: Ambulatory Visit | Attending: Family Medicine | Admitting: Family Medicine

## 2017-10-23 DIAGNOSIS — D509 Iron deficiency anemia, unspecified: Secondary | ICD-10-CM | POA: Diagnosis not present

## 2017-10-23 MED ORDER — SODIUM CHLORIDE 0.9 % IV SOLN
510.0000 mg | INTRAVENOUS | Status: AC
  Administered 2017-10-23: 510 mg via INTRAVENOUS
  Filled 2017-10-23: qty 17

## 2017-10-25 DIAGNOSIS — R69 Illness, unspecified: Secondary | ICD-10-CM | POA: Diagnosis not present

## 2017-11-06 ENCOUNTER — Other Ambulatory Visit: Payer: Self-pay | Admitting: Pulmonary Disease

## 2017-11-10 ENCOUNTER — Other Ambulatory Visit: Payer: Self-pay | Admitting: Internal Medicine

## 2017-11-10 ENCOUNTER — Ambulatory Visit (INDEPENDENT_AMBULATORY_CARE_PROVIDER_SITE_OTHER): Payer: Medicare HMO | Admitting: Internal Medicine

## 2017-11-10 ENCOUNTER — Encounter: Payer: Self-pay | Admitting: Internal Medicine

## 2017-11-10 VITALS — BP 128/74 | HR 59 | Temp 98.1°F | Wt 178.0 lb

## 2017-11-10 DIAGNOSIS — M5441 Lumbago with sciatica, right side: Secondary | ICD-10-CM

## 2017-11-10 MED ORDER — ORPHENADRINE CITRATE ER 100 MG PO TB12: 100.0000 mg | ORAL_TABLET | Freq: Two times a day (BID) | ORAL | 0 refills | Status: DC

## 2017-11-10 MED ORDER — PREDNISONE 10 MG PO TABS: ORAL_TABLET | ORAL | 0 refills | Status: DC

## 2017-11-10 NOTE — Progress Notes (Signed)
Subjective:    Patient ID: [name], female    DOB: [birth year], 72 y.o.   MRN: [id number]  HPI  Pt presents to the clinic today with c/o right low back pain. She reports this started 6 days ago. She describes the pain as sore and achy, but can be sharp and stabbing. The pain has started to radiate down her right leg in the last 2-3 days. She denies numbness, tingling or weakness. She denies any injury to the area but has had sciatica in the past and reports this feels the same. She denies loss of bowel or bladder. She has taken Tylenol OTC with minimal relief.   Review of Systems   Past Medical History:  Diagnosis Date  . Anemia   . Anxiety   . Colon polyps   . Constipation   . Depressive disorder, not elsewhere classified   . Diaphragmatic hernia without mention of obstruction or gangrene   . Disorders of bursae and tendons in shoulder region, unspecified   . Diverticulosis of colon (without mention of hemorrhage)   . External hemorrhoid   . Fundic gland polyps of stomach, benign   . GERD (gastroesophageal reflux disease)   . History of hiatal hernia   . HTN (hypertension)   . Hypothyroidism   . Internal hemorrhoids without mention of complication   . Obesity, unspecified   . OSA on CPAP    compliant with CPAP  . Other urinary incontinence   . Persistent atrial fibrillation ([place])   . Pure hypercholesterolemia   . Type II diabetes mellitus ([place])   . Urinary frequency   . Urinary urgency   . Vitamin B 12 deficiency     Current Outpatient Medications  Medication Sig Dispense Refill  . atorvastatin (LIPITOR) 10 MG tablet TAKE ONE TABLET BY MOUTH EVERY NIGHT AT BEDTIME 90 tablet 3  . Blood Glucose Monitoring Suppl (ONE TOUCH ULTRA 2) w/Device KIT USE AS DIRECTED E11.9  0  . Calcium Carb-Ergocalciferol 500-200 MG-UNIT TABS Take 1 tablet by mouth daily.     [name] diltiazem (CARDIZEM CD) 120 MG 24 hr capsule Take 1 capsule (120 mg total) by mouth daily. 90 capsule 3  .  ELIQUIS 5 MG TABS tablet TAKE 1 TABLET BY MOUTH TWICE A DAY 60 tablet 5  . escitalopram (LEXAPRO) 10 MG tablet TAKE 1 TABLET BY MOUTH ONCE DAILY 90 tablet 1  . ferrous sulfate 325 (65 FE) MG tablet Take 325 mg by mouth every other day.    . fesoterodine (TOVIAZ) 4 MG TB24 tablet Take 1 tablet (4 mg total) by mouth daily. 30 tablet 11  . flecainide (TAMBOCOR) 50 MG tablet Take 50 mg by mouth 2 (two) times daily.    [name] glucose blood (ONE TOUCH ULTRA TEST) test strip CHECK BLOOD SUGAR 2 TIMES A DAY AS DIRECTED  Dx Code E11.9 200 each 3  . Insulin Glargine (LANTUS SOLOSTAR) 100 UNIT/ML Solostar Pen Using as instructed    . metFORMIN (GLUCOPHAGE-XR) 500 MG 24 hr tablet TAKE 4 TABLETS BY MOUTH IN THE EVENING WITH DINNER 360 tablet 0  . Multiple Vitamin (MULTIVITAMIN) tablet Take 1 tablet by mouth daily.      . sennosides-docusate sodium (SENOKOT-S) 8.6-50 MG tablet Take 1 tablet by mouth daily as needed for constipation.    . TRADJENTA 5 MG TABS tablet TAKE 1 TABLET BY MOUTH EVERY DAY 30 tablet 1  . traZODone (DESYREL) 50 MG tablet Take 50 mg by mouth at bedtime.    [name]  traZODone (DESYREL) 50 MG tablet TAKE 1-2 TABLETS (50-100 MG TOTAL) BY MOUTH AT BEDTIME AS NEEDED FOR SLEEP. 180 tablet 1   Current Facility-Administered Medications  Medication Dose Route Frequency Provider Last Rate Last Dose  . 0.9 %  sodium chloride infusion  500 mL Intravenous Once Gatha Mayer, MD        Allergies  Allergen Reactions  . Pravachol [Pravastatin Sodium]     Leg pain   . Contrast Media [Iodinated Diagnostic Agents] Rash  . Cortisone Hives and Rash    Family History  Problem Relation Age of Onset  . Cancer Mother        fallopian tube  . Dementia Mother   . Pulmonary embolism Mother   . Hyperlipidemia Mother   . Diabetes Mother   . Hyperlipidemia Father   . Lung cancer Father   . Hypertension Sister   . Diabetes Brother   . Stroke Paternal Grandmother   . Stroke Paternal Grandfather   . Colon  cancer Neg Hx   . Esophageal cancer Neg Hx     Social History   Socioeconomic History  . Marital status: Married    Spouse name: Laverna Peace  . Number of children: 2  . Years of education: Not on file  . Highest education level: Not on file  Occupational History  . Occupation: retired    Fish farm manager: UNEMPLOYED  Social Needs  . Financial resource strain: Not on file  . Food insecurity:    Worry: Not on file    Inability: Not on file  . Transportation needs:    Medical: Not on file    Non-medical: Not on file  Tobacco Use  . Smoking status: Never Smoker  . Smokeless tobacco: Never Used  Substance and Sexual Activity  . Alcohol use: No    Alcohol/week: 0.0 standard drinks  . Drug use: No  . Sexual activity: Yes  Lifestyle  . Physical activity:    Days per week: Not on file    Minutes per session: Not on file  . Stress: Not on file  Relationships  . Social connections:    Talks on phone: Not on file    Gets together: Not on file    Attends religious service: Not on file    Active member of club or organization: Not on file    Attends meetings of clubs or organizations: Not on file    Relationship status: Not on file  . Intimate partner violence:    Fear of current or ex partner: Not on file    Emotionally abused: Not on file    Physically abused: Not on file    Forced sexual activity: Not on file  Other Topics Concern  . Not on file  Social History Narrative   Exercise walks 3 times daily   Lives with spouse in [place] - retired from employment   1 son and 1 daughter      Diet fruit, occ fast food, salads and lean meat      3 tea/day, no EtOH, no tobacco, no drugs     Constitutional: Denies fever, malaise, fatigue, headache or abrupt weight changes.  Gastrointestinal: Denies abdominal pain, bloating, constipation, diarrhea or blood in the stool.  GU: Denies urgency, frequency, pain with urination, burning sensation, blood in urine, odor or  discharge. Musculoskeletal: Pt reports right lower back pain, right leg pain. Denies decrease in range of motion, difficulty with gait, or joint  swelling.  Neurological:  Denies numbness, tingling, weakness or problems with balance and coordination.    No other specific complaints in a complete review of systems (except as listed in HPI above).  Objective:   Physical Exam  BP 128/74   Pulse (!) 59   Temp 98.1 F (36.7 C) (Oral)   Wt 178 lb (80.7 kg)   LMP  (LMP Unknown)   SpO2 97%   BMI 30.08 kg/m  Wt Readings from Last 3 Encounters:  11/10/17 178 lb (80.7 kg)  10/23/17 175 lb (79.4 kg)  10/16/17 174 lb (78.9 kg)    General: Appears her stated age, well developed, well nourished in NAD. Musculoskeletal: Normal flexion, extension and rotation of the spine. No bony tenderness noted over the spine. Strength 5/5 BLE. Difficulty walking on toes and heels. No difficulty with gait.  Neurological: Alert and oriented. Positive SLR on the right.   BMET    Component Value Date/Time   NA 138 05/26/2017 1046   NA 139 12/02/2016 1233   K 4.8 05/26/2017 1046   CL 102 05/26/2017 1046   CO2 29 05/26/2017 1046   GLUCOSE 120 (H) 05/26/2017 1046   BUN 14 05/26/2017 1046   BUN 17 12/02/2016 1233   CREATININE 0.88 05/26/2017 1046   CREATININE 0.94 09/24/2013 1610   CALCIUM 10.0 05/26/2017 1046   GFRNONAA >60 12/17/2016 0604   GFRAA >60 12/17/2016 0604    Lipid Panel     Component Value Date/Time   CHOL 140 05/26/2017 1046   CHOL 147 11/20/2016 0919   TRIG 50.0 05/26/2017 1046   HDL 51.60 05/26/2017 1046   HDL 47 11/20/2016 0919   CHOLHDL 3 05/26/2017 1046   VLDL 10.0 05/26/2017 1046   LDLCALC 78 05/26/2017 1046   LDLCALC 89 11/20/2016 0919    CBC    Component Value Date/Time   WBC 7.8 09/19/2017 1133   RBC 4.02 09/19/2017 1133   HGB 11.6 (L) 09/19/2017 1133   HGB 11.7 12/02/2016 1233   HCT 34.8 (L) 09/19/2017 1133   HCT 35.8 12/02/2016 1233   PLT 279.0 09/19/2017 1133    PLT 297 12/02/2016 1233   MCV 86.7 09/19/2017 1133   MCV 88 12/02/2016 1233   MCH 28.6 12/17/2016 0604   MCHC 33.3 09/19/2017 1133   RDW 14.8 09/19/2017 1133   RDW 14.6 12/02/2016 1233   LYMPHSABS 1.8 09/19/2017 1133   LYMPHSABS 2.0 12/02/2016 1233   MONOABS 0.5 09/19/2017 1133   EOSABS 0.1 09/19/2017 1133   EOSABS 0.1 12/02/2016 1233   BASOSABS 0.0 09/19/2017 1133   BASOSABS 0.0 12/02/2016 1233    Hgb A1C Lab Results  Component Value Date   HGBA1C 6.7 (A) 07/25/2017            Assessment & Plan:   Acute Low Back Pain with Right Side Sciatica:  Encouraged ice, massage and stretching eRx for Pred Taper x 9 days- monitor sugars, avoid OTC NSAID's eRx for Norflex 100 mg BID prn- sedation caution given  Follow up with PCP if symptoms persist Webb Silversmith, NP

## 2017-11-10 NOTE — Patient Instructions (Signed)
Sciatica Sciatica is pain, numbness, weakness, or tingling along your sciatic nerve. The sciatic nerve starts in the lower back and goes down the back of each leg. Sciatica happens when this nerve is pinched or has pressure put on it. Sciatica usually goes away on its own or with treatment. Sometimes, sciatica may keep coming back (recur). Follow these instructions at home: Medicines  Take over-the-counter and prescription medicines only as told by your doctor.  Do not drive or use heavy machinery while taking prescription pain medicine. Managing pain  If directed, put ice on the affected area. ? Put ice in a plastic bag. ? Place a towel between your skin and the bag. ? Leave the ice on for 20 minutes, 2-3 times a day.  After icing, apply heat to the affected area before you exercise or as often as told by your doctor. Use the heat source that your doctor tells you to use, such as a moist heat pack or a heating pad. ? Place a towel between your skin and the heat source. ? Leave the heat on for 20-30 minutes. ? Remove the heat if your skin turns bright red. This is especially important if you are unable to feel pain, heat, or cold. You may have a greater risk of getting burned. Activity  Return to your normal activities as told by your doctor. Ask your doctor what activities are safe for you. ? Avoid activities that make your sciatica worse.  Take short rests during the day. Rest in a lying or standing position. This is usually better than sitting to rest. ? When you rest for a long time, do some physical activity or stretching between periods of rest. ? Avoid sitting for a long time without moving. Get up and move around at least one time each hour.  Exercise and stretch regularly, as told by your doctor.  Do not lift anything that is heavier than 10 lb (4.5 kg) while you have symptoms of sciatica. ? Avoid lifting heavy things even when you do not have symptoms. ? Avoid lifting heavy  things over and over.  When you lift objects, always lift in a way that is safe for your body. To do this, you should: ? Bend your knees. ? Keep the object close to your body. ? Avoid twisting. General instructions  Use good posture. ? Avoid leaning forward when you are sitting. ? Avoid hunching over when you are standing.  Stay at a healthy weight.  Wear comfortable shoes that support your feet. Avoid wearing high heels.  Avoid sleeping on a mattress that is too soft or too hard. You might have less pain if you sleep on a mattress that is firm enough to support your back.  Keep all follow-up visits as told by your doctor. This is important. Contact a doctor if:  You have pain that: ? Wakes you up when you are sleeping. ? Gets worse when you lie down. ? Is worse than the pain you have had in the past. ? Lasts longer than 4 weeks.  You lose weight for without trying. Get help right away if:  You cannot control when you pee (urinate) or poop (have a bowel movement).  You have weakness in any of these areas and it gets worse. ? Lower back. ? Lower belly (pelvis). ? Butt (buttocks). ? Legs.  You have redness or swelling of your back.  You have a burning feeling when you pee. This information is not intended to replace   advice given to you by your health care provider. Make sure you discuss any questions you have with your health care provider. Document Released: 11/28/2007 Document Revised: 07/27/2015 Document Reviewed: 10/28/2014 Elsevier Interactive Patient Education  2018 [employer]  

## 2017-12-01 ENCOUNTER — Encounter: Payer: Self-pay | Admitting: Internal Medicine

## 2017-12-01 ENCOUNTER — Ambulatory Visit: Payer: Medicare HMO | Admitting: Internal Medicine

## 2017-12-01 VITALS — BP 130/62 | HR 67 | Ht 64.5 in | Wt 175.0 lb

## 2017-12-01 DIAGNOSIS — Z23 Encounter for immunization: Secondary | ICD-10-CM

## 2017-12-01 DIAGNOSIS — E7800 Pure hypercholesterolemia, unspecified: Secondary | ICD-10-CM | POA: Diagnosis not present

## 2017-12-01 DIAGNOSIS — E119 Type 2 diabetes mellitus without complications: Secondary | ICD-10-CM

## 2017-12-01 DIAGNOSIS — Z794 Long term (current) use of insulin: Secondary | ICD-10-CM | POA: Diagnosis not present

## 2017-12-01 DIAGNOSIS — E663 Overweight: Secondary | ICD-10-CM | POA: Diagnosis not present

## 2017-12-01 LAB — POCT GLYCOSYLATED HEMOGLOBIN (HGB A1C): Hemoglobin A1C: 6.4 % — AB (ref 4.0–5.6)

## 2017-12-01 NOTE — Patient Instructions (Signed)
Please continue: - Metformin ER 1000 mg 2x a day with meals - Tradjenta 5 mg before lunch  Please come back for a follow-up appointment in 4 months.

## 2017-12-01 NOTE — Progress Notes (Signed)
Patient ID: [name], female   DOB: 02/20/1946, 72 y.o.   MRN: [id number]  HPI: [name] is a 72 y.o.-year-old female, returning for f/u for DM2, dx 1995, insulin-dependent since 2014 - now off insulin since 12/2016, with improved control, without long term complications. Last visit 4 months ago.  She has been on Prednisone for sciatica. She cannot walk as she did before, but the pain is easing up.  Last hemoglobin A1c was: Lab Results  Component Value Date   HGBA1C 6.7 (A) 07/25/2017   HGBA1C 6.7 03/27/2017   HGBA1C 6.4 (H) 11/20/2016   Pt is on a regimen of: - Metformin ER 1000 mg 2x a day (2000 mg with dinner not as efficient) - Tradjenta 5 mg before lunch She was on Basaglar 12 >> 14 units in hs >> stopped at last visit - but still takes it seldom (10-12 units)  Pt checks her sugars twice a day: - am:100-120, 134 >> 97-120, 145 (cruise) >> 98, 106-120s, 130 - 2h after b'fast: 90-103 >> 113-141 >> n/c - before lunch:90-130 >> 100-125, 199 >> n/c - 2h after lunch:  160-180 >> 140-160s >> n/c - before dinner: n/c >> 102-144 >> n/c - 2h after dinner: 130-150, 180, 200 >> 160-180, 195 (cruise) >> 160s - bedtime: 120-160 >> 140-160 >> n/c - nighttime: n/c Lowest sugar was 90 >> 98; she has hypoglycemia awareness in the 80s. Highest sugar was 195 >> 200 (starch).  Pt's meals are: - Breakfast: cereals + 2% milk; bread + cheese; bread + PB - Lunch: sandwich; soup - Dinner: chicken; steak; seafood - Snacks: 2: PB crackers Continues to work on reducing portions.  -No CKD, last BUN/creatinine:  Lab Results  Component Value Date   BUN 14 05/26/2017   CREATININE 0.88 05/26/2017  She is not on an ACE inhibitor/ARB. -+ HL; last set of lipids: Lab Results  Component Value Date   CHOL 140 05/26/2017   HDL 51.60 05/26/2017   LDLCALC 78 05/26/2017   LDLDIRECT 163.1 07/04/2011   TRIG 50.0 05/26/2017   CHOLHDL 3 05/26/2017  On Lipitor.   - last eye exam was in 05/2016:  No DR- [facility].  She had cataract surgery in 04 and 07/2016. -Denies numbness and tingling in her feet.  She has A. Fib >> had several cardioversions. On Flecainide, Diltiazem, Eliquis.  ROS: Constitutional: no weight gain/no weight loss, no fatigue, no subjective hyperthermia, no subjective hypothermia, + nocturia Eyes: no blurry vision, no xerophthalmia ENT: no sore throat, no nodules palpated in throat, no dysphagia, no odynophagia, no hoarseness Cardiovascular: no CP/no SOB/no palpitations/no leg swelling Respiratory: no cough/no SOB/no wheezing Gastrointestinal: no N/no V/no D/no C/no acid reflux Musculoskeletal: no muscle aches/+ joint aches Skin: no rashes, no hair loss Neurological: no tremors/no numbness/no tingling/no dizziness  I reviewed pt's medications, allergies, PMH, social hx, family hx, and changes were documented in the history of present illness. Otherwise, unchanged from my initial visit note. Off Vesicare (2/2 cost). On Lisbeth Ply - not working as well.   Past Medical History:  Diagnosis Date  . Anemia   . Anxiety   . Colon polyps   . Constipation   . Depressive disorder, not elsewhere classified   . Diaphragmatic hernia without mention of obstruction or gangrene   . Disorders of bursae and tendons in shoulder region, unspecified   . Diverticulosis of colon (without mention of hemorrhage)   . External hemorrhoid   . Fundic gland polyps of stomach,  benign   . GERD (gastroesophageal reflux disease)   . History of hiatal hernia   . HTN (hypertension)   . Hypothyroidism   . Internal hemorrhoids without mention of complication   . Obesity, unspecified   . OSA on CPAP    compliant with CPAP  . Other urinary incontinence   . Persistent atrial fibrillation ([place])   . Pure hypercholesterolemia   . Type II diabetes mellitus ([place])   . Urinary frequency   . Urinary urgency   . Vitamin B 12 deficiency    Past Surgical History:  Procedure Laterality Date  .  ABDOMINAL HYSTERECTOMY  1972   Have both ovaries  . ATRIAL FIBRILLATION ABLATION  12/17/2016  . ATRIAL FIBRILLATION ABLATION N/A 12/17/2016   Procedure: ATRIAL FIBRILLATION ABLATION;  Surgeon: Thompson Grayer, MD;  Location: [facility];  Service: Cardiovascular;  Laterality: N/A;  . [place]   [facility]; "Dr. [name]"  . CARDIOVERSION N/A 05/27/2016   Procedure: CARDIOVERSION;  Surgeon: [name], MD;  Location: [facility];  Service: Cardiovascular;  Laterality: N/A;  . CARDIOVERSION N/A 11/25/2016   Procedure: CARDIOVERSION;  Surgeon: [name], MD;  Location: [facility];  Service: Cardiovascular;  Laterality: N/A;  . CATARACT EXTRACTION W/ INTRAOCULAR LENS  IMPLANT, BILATERAL Bilateral   . COLONOSCOPY    . ELECTROPHYSIOLOGIC STUDY N/A 01/24/2016   Procedure: CARDIOVERSION;  Surgeon: [name], MD;  Location: [facility];  Service: Cardiovascular;  Laterality: N/A;  . LAPAROSCOPIC CHOLECYSTECTOMY    . REDUCTION MAMMAPLASTY Bilateral   . TONSILLECTOMY     Social History   Socioeconomic History  . Marital status: Married    Spouse name: Laverna Peace  . Number of children: 2  . Years of education: Not on file  . Highest education level: Not on file  Occupational History  . Occupation: retired    Fish farm manager: UNEMPLOYED  Social Needs  . Financial resource strain: Not on file  . Food insecurity:    Worry: Not on file    Inability: Not on file  . Transportation needs:    Medical: Not on file    Non-medical: Not on file  Tobacco Use  . Smoking status: Never Smoker  . Smokeless tobacco: Never Used  Substance and Sexual Activity  . Alcohol use: No    Alcohol/week: 0.0 standard drinks  . Drug use: No  . Sexual activity: Yes  Lifestyle  . Physical activity:    Days per week: Not on file    Minutes per session: Not on file  . Stress: Not on file  Relationships  . Social connections:    Talks on phone: Not on file    Gets together: Not on file     Attends religious service: Not on file    Active member of club or organization: Not on file    Attends meetings of clubs or organizations: Not on file    Relationship status: Not on file  . Intimate partner violence:    Fear of current or ex partner: Not on file    Emotionally abused: Not on file    Physically abused: Not on file    Forced sexual activity: Not on file  Other Topics Concern  . Not on file  Social History Narrative   Exercise walks 3 times daily   Lives with spouse in [place] - retired from employment   1 son and 1 daughter      Diet fruit, occ  fast food, salads and lean meat      3 tea/day, no EtOH, no tobacco, no drugs   Current Outpatient Medications on File Prior to Visit  Medication Sig Dispense Refill  . atorvastatin (LIPITOR) 10 MG tablet TAKE ONE TABLET BY MOUTH EVERY NIGHT AT BEDTIME 90 tablet 3  . Blood Glucose Monitoring Suppl (ONE TOUCH ULTRA 2) w/Device KIT USE AS DIRECTED E11.9  0  . Calcium Carb-Ergocalciferol 500-200 MG-UNIT TABS Take 1 tablet by mouth daily.     [name] diltiazem (CARDIZEM CD) 120 MG 24 hr capsule Take 1 capsule (120 mg total) by mouth daily. 90 capsule 3  . ELIQUIS 5 MG TABS tablet TAKE 1 TABLET BY MOUTH TWICE A DAY 60 tablet 5  . escitalopram (LEXAPRO) 10 MG tablet TAKE 1 TABLET BY MOUTH ONCE DAILY 90 tablet 1  . ferrous sulfate 325 (65 FE) MG tablet Take 325 mg by mouth every other day.    . fesoterodine (TOVIAZ) 4 MG TB24 tablet Take 1 tablet (4 mg total) by mouth daily. 30 tablet 11  . flecainide (TAMBOCOR) 50 MG tablet Take 50 mg by mouth 2 (two) times daily.    [name] glucose blood (ONE TOUCH ULTRA TEST) test strip CHECK BLOOD SUGAR 2 TIMES A DAY AS DIRECTED  Dx Code E11.9 200 each 3  . Insulin Glargine (LANTUS SOLOSTAR) 100 UNIT/ML Solostar Pen Using as instructed    . metFORMIN (GLUCOPHAGE-XR) 500 MG 24 hr tablet TAKE 4 TABLETS BY MOUTH IN THE EVENING WITH DINNER 360 tablet 0  . Multiple Vitamin (MULTIVITAMIN) tablet  Take 1 tablet by mouth daily.      . orphenadrine (NORFLEX) 100 MG tablet Take 1 tablet (100 mg total) by mouth 2 (two) times daily. 20 tablet 0  . predniSONE (DELTASONE) 10 MG tablet Take 3 tabs on days 1-3, take 2 tabs on days 4-6, take 1 tab on days 7-9 18 tablet 0  . sennosides-docusate sodium (SENOKOT-S) 8.6-50 MG tablet Take 1 tablet by mouth daily as needed for constipation.    . TRADJENTA 5 MG TABS tablet TAKE 1 TABLET BY MOUTH EVERY DAY 30 tablet 1  . traZODone (DESYREL) 50 MG tablet TAKE 1-2 TABLETS (50-100 MG TOTAL) BY MOUTH AT BEDTIME AS NEEDED FOR SLEEP. 180 tablet 1   Current Facility-Administered Medications on File Prior to Visit  Medication Dose Route Frequency Provider Last Rate Last Dose  . 0.9 %  sodium chloride infusion  500 mL Intravenous Once Gatha Mayer, MD       Allergies  Allergen Reactions  . Pravachol [Pravastatin Sodium]     Leg pain   . Contrast Media [Iodinated Diagnostic Agents] Rash  . Cortisone Hives and Rash   Family History  Problem Relation Age of Onset  . Cancer Mother        fallopian tube  . Dementia Mother   . Pulmonary embolism Mother   . Hyperlipidemia Mother   . Diabetes Mother   . Hyperlipidemia Father   . Lung cancer Father   . Hypertension Sister   . Diabetes Brother   . Stroke Paternal Grandmother   . Stroke Paternal Grandfather   . Colon cancer Neg Hx   . Esophageal cancer Neg Hx     PE: BP 130/62   Pulse 67   Ht 5' 4.5" (1.638 m)   Wt 175 lb (79.4 kg)   LMP  (LMP Unknown)   SpO2 97%   BMI 29.57 kg/m  Body mass index is  29.57 kg/m. Wt Readings from Last 3 Encounters:  12/01/17 175 lb (79.4 kg)  11/10/17 178 lb (80.7 kg)  10/23/17 175 lb (79.4 kg)   Constitutional: overweight, in NAD Eyes: PERRLA, EOMI, no exophthalmos ENT: moist mucous membranes, no thyromegaly, no cervical lymphadenopathy Cardiovascular: RRR, No MRG Respiratory: CTA B Gastrointestinal: abdomen soft, NT, ND, BS+ Musculoskeletal: no  deformities, strength intact in all 4 Skin: moist, warm, no rashes Neurological: no tremor with outstretched hands, DTR normal in all 4  ASSESSMENT: 1. DM2, previously insulin-dependent, controlled, without long term complications, but with hyperglycemia - she was contemplating gastric sleeve sx >> on hold now  2. Overweight  3. Hyperlipidemia  PLAN:  1. Patient with long-standing, now controlled type 2 diabetes, initially insulin-dependent, now off insulin after she started to adjust her diet and lose weight.  She is on metformin and Tradjenta, which she tolerates well. - At last visit, sugars were at goal in the morning but higher after dinner.  We discussed about improving dinner.  At that time, she was taking the entire metformin dose with dinner, but she felt that her sugars were controlled when she was splitting the dose.  Therefore, I advised her to split the dose into 1000 mg twice a day.  HbA1c returned at 6.7%, stable. - At this visit, sugars are approximately the same as before, but slightly better after dinner. Only occasional hyperglycemic spikes, her sugars reached 200 when she was on prednisone.  However, she could not tolerate the medication well and she could not sleep so she will not take this anymore. - I suggested to:  Patient Instructions  Please continue: - Metformin ER 1000 mg 2x a day with meals - Tradjenta 5 mg before lunch  Please come back for a follow-up appointment in 4 months.  - today, HbA1c is 6.4% (better) - continue checking sugars at different times of the day - check 1x a day, rotating checks - advised for yearly eye exams >> she is not UTD - + flu shot today - Return to clinic in 4 mo with sugar log    2. Overweight -Not seeing Dr. Leafy Ro in the weight loss center anymore -Continue metformin and Tradjenta, which are both weight neutral  3.  Hyperlipidemia - Reviewed latest lipid panel from 05/2017: LDL significantly improved, now at goal Lab  Results  Component Value Date   CHOL 140 05/26/2017   HDL 51.60 05/26/2017   LDLCALC 78 05/26/2017   LDLDIRECT 163.1 07/04/2011   TRIG 50.0 05/26/2017   CHOLHDL 3 05/26/2017  - Continues Lipitor without side effects.  [name], MD PhD [facility]

## 2017-12-01 NOTE — Addendum Note (Signed)
Addended by: Cardell Peach I on: 12/01/2017 10:42 AM   Modules accepted: Orders

## 2017-12-05 ENCOUNTER — Other Ambulatory Visit: Payer: Self-pay | Admitting: Internal Medicine

## 2017-12-19 DIAGNOSIS — M545 Low back pain: Secondary | ICD-10-CM | POA: Diagnosis not present

## 2017-12-19 DIAGNOSIS — M5416 Radiculopathy, lumbar region: Secondary | ICD-10-CM | POA: Diagnosis not present

## 2017-12-19 DIAGNOSIS — M25551 Pain in right hip: Secondary | ICD-10-CM | POA: Diagnosis not present

## 2017-12-22 DIAGNOSIS — R69 Illness, unspecified: Secondary | ICD-10-CM | POA: Diagnosis not present

## 2017-12-30 ENCOUNTER — Other Ambulatory Visit: Payer: Self-pay

## 2017-12-30 ENCOUNTER — Emergency Department: Payer: Medicare HMO

## 2017-12-30 ENCOUNTER — Encounter: Payer: Self-pay | Admitting: *Deleted

## 2017-12-30 DIAGNOSIS — S0083XA Contusion of other part of head, initial encounter: Secondary | ICD-10-CM | POA: Diagnosis not present

## 2017-12-30 DIAGNOSIS — Y9301 Activity, walking, marching and hiking: Secondary | ICD-10-CM | POA: Insufficient documentation

## 2017-12-30 DIAGNOSIS — W010XXA Fall on same level from slipping, tripping and stumbling without subsequent striking against object, initial encounter: Secondary | ICD-10-CM | POA: Diagnosis not present

## 2017-12-30 DIAGNOSIS — Y999 Unspecified external cause status: Secondary | ICD-10-CM | POA: Insufficient documentation

## 2017-12-30 DIAGNOSIS — S0990XA Unspecified injury of head, initial encounter: Secondary | ICD-10-CM | POA: Diagnosis not present

## 2017-12-30 DIAGNOSIS — S0993XA Unspecified injury of face, initial encounter: Secondary | ICD-10-CM | POA: Diagnosis not present

## 2017-12-30 DIAGNOSIS — Z79899 Other long term (current) drug therapy: Secondary | ICD-10-CM | POA: Insufficient documentation

## 2017-12-30 DIAGNOSIS — Z7984 Long term (current) use of oral hypoglycemic drugs: Secondary | ICD-10-CM | POA: Diagnosis not present

## 2017-12-30 DIAGNOSIS — Y929 Unspecified place or not applicable: Secondary | ICD-10-CM | POA: Insufficient documentation

## 2017-12-30 DIAGNOSIS — I10 Essential (primary) hypertension: Secondary | ICD-10-CM | POA: Insufficient documentation

## 2017-12-30 DIAGNOSIS — E119 Type 2 diabetes mellitus without complications: Secondary | ICD-10-CM | POA: Insufficient documentation

## 2017-12-30 NOTE — ED Triage Notes (Signed)
[place].  Pt tripped on a root and fell.  Pt has bruising to nose, both eyes and bleeding from left nares.  No bleeding now. Pt on eliquis for afib. Pt alert  Speech clear.  Pt has a headache.  No vomiting   No loc.

## 2017-12-31 ENCOUNTER — Telehealth: Payer: Self-pay

## 2017-12-31 ENCOUNTER — Other Ambulatory Visit: Payer: Self-pay | Admitting: Internal Medicine

## 2017-12-31 ENCOUNTER — Emergency Department
Admission: EM | Admit: 2017-12-31 | Discharge: 2017-12-31 | Disposition: A | Discharge status: Home / Self Care | Payer: Medicare HMO | Attending: Emergency Medicine | Admitting: Emergency Medicine

## 2017-12-31 DIAGNOSIS — S0083XA Contusion of other part of head, initial encounter: Secondary | ICD-10-CM

## 2017-12-31 NOTE — Telephone Encounter (Signed)
Noted  

## 2017-12-31 NOTE — Telephone Encounter (Signed)
[facility] faxed note pt fell 12/30/17 face first and nose iis swollen and eyes are black. Per chart review pt is at [facility] ED 12/31/17. TH note in Dr Arley Phenix in box.

## 2017-12-31 NOTE — ED Provider Notes (Signed)
[facility] Emergency Department Provider Note    First MD Initiated Contact with Patient 12/31/17 0151     (approximate)  I have reviewed the triage vital signs and the nursing notes.   HISTORY  Chief Complaint Fall    HPI [name] is a 72 y.o. female with below list of chronic medical conditions including atrial fibrillation for which the patient takes Eliquis presents to the emergency department following accidental fall while out with her great grandkids yesterday in a pumpkin patch.  She admits to head injury striking her face when she fell however patient denies any loss of consciousness no dizziness or any preceding symptoms.  Patient admits to left nare epistaxis following the injury that spontaneously resolved.   Past Medical History:  Diagnosis Date  . Anemia   . Anxiety   . Colon polyps   . Constipation   . Depressive disorder, not elsewhere classified   . Diaphragmatic hernia without mention of obstruction or gangrene   . Disorders of bursae and tendons in shoulder region, unspecified   . Diverticulosis of colon (without mention of hemorrhage)   . External hemorrhoid   . Fundic gland polyps of stomach, benign   . GERD (gastroesophageal reflux disease)   . History of hiatal hernia   . HTN (hypertension)   . Hypothyroidism   . Internal hemorrhoids without mention of complication   . Obesity, unspecified   . OSA on CPAP    compliant with CPAP  . Other urinary incontinence   . Persistent atrial fibrillation   . Pure hypercholesterolemia   . Type II diabetes mellitus ([place])   . Urinary frequency   . Urinary urgency   . Vitamin B 12 deficiency     Patient Active Problem List   Diagnosis Date Noted  . Vitamin B 12 deficiency   . Urge incontinence   . OSA on CPAP   . Hypothyroidism   . HTN (hypertension)   . History of hiatal hernia   . GERD (gastroesophageal reflux disease)   . Anxiety   . Other constipation 01/02/2017  .  Class 1 obesity with serious comorbidity and body mass index (BMI) of 30.0 to 30.9 in adult 01/02/2017  . Overweight (BMI 25.0-29.9) 12/19/2016  . Persistent atrial fibrillation 12/17/2016  . Vitamin D deficiency 11/20/2016  . Family history of ovarian cancer 05/24/2016  . Iron deficiency anemia   . Essential hypertension   . Advanced care planning/counseling discussion 05/16/2015  . Controlled diabetes mellitus type II without complication ([place]) 68/10/8108  . Mitral regurgitation 10/11/2013  . Incidental lung nodule 07/16/2011  . 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 SLEEP APNEA 10/25/2008  . Hypercholesterolemia 06/11/2006  . Depression, major, in remission ([place]) 06/11/2006  . HEMORRHOIDS, INTERNAL 06/11/2006  . DIVERTICULOSIS, COLON 06/11/2006  . STRESS INCONTINENCE 06/11/2006    Past Surgical History:  Procedure Laterality Date  . ABDOMINAL HYSTERECTOMY  1972   Have both ovaries  . ATRIAL FIBRILLATION ABLATION  12/17/2016  . ATRIAL FIBRILLATION ABLATION N/A 12/17/2016   Procedure: ATRIAL FIBRILLATION ABLATION;  Surgeon: Thompson Grayer, MD;  Location: [place] [facility];  Service: Cardiovascular;  Laterality: N/A;  . [place]   [facility]; "Dr. [name]"  . CARDIOVERSION N/A 05/27/2016   Procedure: CARDIOVERSION;  Surgeon: [name], MD;  Location: [facility];  Service: Cardiovascular;  Laterality: N/A;  . CARDIOVERSION N/A 11/25/2016   Procedure: CARDIOVERSION;  Surgeon: [name], MD;  Location: [facility];  Service:  Cardiovascular;  Laterality: N/A;  . CATARACT EXTRACTION W/ INTRAOCULAR LENS  IMPLANT, BILATERAL Bilateral   . COLONOSCOPY    . ELECTROPHYSIOLOGIC STUDY N/A 01/24/2016   Procedure: CARDIOVERSION;  Surgeon: [name], MD;  Location: [facility] ORS;  Service: Cardiovascular;  Laterality: N/A;  . LAPAROSCOPIC CHOLECYSTECTOMY    . REDUCTION MAMMAPLASTY Bilateral   . TONSILLECTOMY      Prior to Admission medications   Medication Sig Start Date End  Date Taking? Authorizing Provider  atorvastatin (LIPITOR) 10 MG tablet TAKE ONE TABLET BY MOUTH EVERY NIGHT AT BEDTIME 06/16/17   Bedsole, Amy E, MD  Blood Glucose Monitoring Suppl (ONE TOUCH ULTRA 2) w/Device KIT USE AS DIRECTED E11.9 04/23/17   [provider]  Calcium Carb-Ergocalciferol 500-200 MG-UNIT TABS Take 1 tablet by mouth daily.  02/19/08   [provider]  diltiazem (CARDIZEM CD) 120 MG 24 hr capsule Take 1 capsule (120 mg total) by mouth daily. 03/19/17   Allred, Jeneen Rinks, MD  ELIQUIS 5 MG TABS tablet TAKE 1 TABLET BY MOUTH TWICE A DAY 09/08/17   [name], MD  escitalopram (LEXAPRO) 10 MG tablet TAKE 1 TABLET BY MOUTH ONCE DAILY 08/25/17   Bedsole, Amy E, MD  ferrous sulfate 325 (65 FE) MG tablet Take 325 mg by mouth every other day.    [provider]  fesoterodine (TOVIAZ) 4 MG TB24 tablet Take 1 tablet (4 mg total) by mouth daily. 10/01/17   Bedsole, Amy E, MD  flecainide (TAMBOCOR) 50 MG tablet Take 50 mg by mouth 2 (two) times daily.    [provider]  glucose blood (ONE TOUCH ULTRA TEST) test strip CHECK BLOOD SUGAR 2 TIMES A DAY AS DIRECTED  Dx Code E11.9 04/25/17   [name], MD  metFORMIN (GLUCOPHAGE-XR) 500 MG 24 hr tablet TAKE 4 TABLETS BY MOUTH IN THE EVENING WITH DINNER 09/22/17   [name], MD  Multiple Vitamin (MULTIVITAMIN) tablet Take 1 tablet by mouth daily.      [provider]  orphenadrine (NORFLEX) 100 MG tablet Take 1 tablet (100 mg total) by mouth 2 (two) times daily. 11/10/17   Jearld Fenton, NP  sennosides-docusate sodium (SENOKOT-S) 8.6-50 MG tablet Take 1 tablet by mouth daily as needed for constipation.    [provider]  TRADJENTA 5 MG TABS tablet TAKE 1 TABLET BY MOUTH EVERY DAY 12/05/17   [name], MD  traZODone (DESYREL) 50 MG tablet TAKE 1-2 TABLETS (50-100 MG TOTAL) BY MOUTH AT BEDTIME AS NEEDED FOR SLEEP. 11/06/17   Wilhelmina Mcardle, MD    Allergies Pravachol [pravastatin  sodium]; Contrast media [iodinated diagnostic agents]; and Cortisone  Family History  Problem Relation Age of Onset  . Cancer Mother        fallopian tube  . Dementia Mother   . Pulmonary embolism Mother   . Hyperlipidemia Mother   . Diabetes Mother   . Hyperlipidemia Father   . Lung cancer Father   . Hypertension Sister   . Diabetes Brother   . Stroke Paternal Grandmother   . Stroke Paternal Grandfather   . Colon cancer Neg Hx   . Esophageal cancer Neg Hx     Social History Social History   Tobacco Use  . Smoking status: Never Smoker  . Smokeless tobacco: Never Used  Substance Use Topics  . Alcohol use: No    Alcohol/week: 0.0 standard drinks  . Drug use: No    Review of Systems Constitutional: No fever/chills Eyes:  No visual changes. ENT: No sore throat.  Positive for nasal bridge pain and left near epistaxis Cardiovascular: Denies chest pain. Respiratory: Denies shortness of breath. Gastrointestinal: No abdominal pain.  No nausea, no vomiting.  No diarrhea.  No constipation. Genitourinary: Negative for dysuria. Musculoskeletal: Negative for neck pain.  Negative for back pain. Integumentary: Negative for rash. Neurological: Negative for headaches, focal weakness or numbness.   ____________________________________________   PHYSICAL EXAM:  VITAL SIGNS: ED Triage Vitals [12/30/17 2240]  Enc Vitals Group     BP (!) 156/57     Pulse Rate (!) 59     Resp 18     Temp 97.9 F (36.6 C)     Temp Source Oral     SpO2 100 %     Weight 78 kg (172 lb)     Height 1.626 m (5' 4")     Head Circumference      Peak Flow      Pain Score 6     Pain Loc      Pain Edu?      Excl. in [place]?     Constitutional: Alert and oriented. Well appearing and in no acute distress. Eyes: Conjunctivae are normal.  Bilateral periorbital ecchymoses  head: Swelling and tenderness of nasal bridge with ecchymoses ose: No congestion/rhinnorhea.  Nasal bridge swelling and ecchymosis  however no septal hematoma Mouth/Throat: Mucous membranes are moist. Oropharynx non-erythematous. Neck: No stridor.   Cardiovascular: Normal rate, regular rhythm. Good peripheral circulation. Grossly normal heart sounds. Respiratory: Normal respiratory effort.  No retractions. Lungs CTAB. Gastrointestinal: Soft and nontender. No distention.  Musculoskeletal: No lower extremity tenderness nor edema. No gross deformities of extremities. Neurologic:  Normal speech and language. No gross focal neurologic deficits are appreciated.  Skin:  Skin is warm, dry and intact. No rash noted. Psychiatric: Mood and affect are normal. Speech and behavior are normal.    RADIOLOGY I, Hardy, personally viewed and evaluated these images (plain radiographs) as part of my medical decision making, as well as reviewing the written report by the radiologist.  ED MD interpretation: No intracranial abnormality or maxillofacial bones fracture.  Official radiology report(s): Ct Head Wo Contrast  Result Date: 12/30/2017 CLINICAL DATA:  72 year old female with maxillofacial trauma. EXAM: CT HEAD WITHOUT CONTRAST CT MAXILLOFACIAL WITHOUT CONTRAST TECHNIQUE: Multidetector CT imaging of the head and maxillofacial structures were performed using the standard protocol without intravenous contrast. Multiplanar CT image reconstructions of the maxillofacial structures were also generated. COMPARISON:  Head CT dated 12/30/2006 FINDINGS: CT HEAD FINDINGS Brain: The ventricles and sulci appropriate size for patient's age. Mild periventricular and deep white matter chronic microvascular ischemic changes noted. There is no acute intracranial hemorrhage. No mass effect or midline shift. No extra-axial fluid collection. Vascular: No hyperdense vessel or unexpected calcification. Skull: Normal. Negative for fracture or focal lesion. Other: None CT MAXILLOFACIAL FINDINGS Osseous: No fracture or mandibular dislocation. No  destructive process. Orbits: Negative. No traumatic or inflammatory finding. Sinuses: Clear. Soft tissues: Negative. IMPRESSION: 1. No acute intracranial pathology. 2. No acute facial bone fractures. Electronically Signed   By: Anner Crete M.D.   On: 12/30/2017 23:13   Ct Maxillofacial Wo Contrast  Result Date: 12/30/2017 CLINICAL DATA:  72 year old female with maxillofacial trauma. EXAM: CT HEAD WITHOUT CONTRAST CT MAXILLOFACIAL WITHOUT CONTRAST TECHNIQUE: Multidetector CT imaging of the head and maxillofacial structures were performed using the standard protocol without intravenous contrast. Multiplanar CT image reconstructions of the maxillofacial structures were also generated.  COMPARISON:  Head CT dated 12/30/2006 FINDINGS: CT HEAD FINDINGS Brain: The ventricles and sulci appropriate size for patient's age. Mild periventricular and deep white matter chronic microvascular ischemic changes noted. There is no acute intracranial hemorrhage. No mass effect or midline shift. No extra-axial fluid collection. Vascular: No hyperdense vessel or unexpected calcification. Skull: Normal. Negative for fracture or focal lesion. Other: None CT MAXILLOFACIAL FINDINGS Osseous: No fracture or mandibular dislocation. No destructive process. Orbits: Negative. No traumatic or inflammatory finding. Sinuses: Clear. Soft tissues: Negative. IMPRESSION: 1. No acute intracranial pathology. 2. No acute facial bone fractures. Electronically Signed   By: Anner Crete M.D.   On: 12/30/2017 23:13     Procedures   ____________________________________________   INITIAL IMPRESSION / ASSESSMENT AND PLAN / ED COURSE  As part of my medical decision making, I reviewed the following data within the electronic MEDICAL RECORD NUMBER  72 year old female presenting with above-stated history and physical exam following accidental trip and fall with resultant head/face injury.  CT maxillofacial and head performed which revealed no  acute findings.  Patient with facial contusions with periorbital contusion as well.  Consider the possibility of septal hematoma given history of epistaxis however no septal hematoma was identified on clinical exam ____________________________________________  FINAL CLINICAL IMPRESSION(S) / ED DIAGNOSES  Final diagnoses:  Contusion of face, initial encounter     MEDICATIONS GIVEN DURING THIS VISIT:  Medications - No data to display   ED Discharge Orders    None       Note:  This document was prepared using Dragon voice recognition software and may include unintentional dictation errors.    Gregor Hams, MD 12/31/17 [phone]

## 2017-12-31 NOTE — ED Notes (Signed)
Patient discharged to home per MD order. Patient in stable condition, and deemed medically cleared by ED provider for discharge. Discharge instructions reviewed with patient/family using "Teach Back"; verbalized understanding of medication education and administration, and information about follow-up care. Denies further concerns. ° °

## 2018-01-05 DIAGNOSIS — M5416 Radiculopathy, lumbar region: Secondary | ICD-10-CM | POA: Insufficient documentation

## 2018-01-07 DIAGNOSIS — M5416 Radiculopathy, lumbar region: Secondary | ICD-10-CM | POA: Diagnosis not present

## 2018-01-07 DIAGNOSIS — M545 Low back pain: Secondary | ICD-10-CM | POA: Diagnosis not present

## 2018-01-07 DIAGNOSIS — M25551 Pain in right hip: Secondary | ICD-10-CM | POA: Diagnosis not present

## 2018-01-16 DIAGNOSIS — M545 Low back pain: Secondary | ICD-10-CM | POA: Diagnosis not present

## 2018-01-28 ENCOUNTER — Other Ambulatory Visit: Payer: Self-pay | Admitting: Internal Medicine

## 2018-01-28 DIAGNOSIS — M25551 Pain in right hip: Secondary | ICD-10-CM | POA: Diagnosis not present

## 2018-01-28 DIAGNOSIS — M5416 Radiculopathy, lumbar region: Secondary | ICD-10-CM | POA: Diagnosis not present

## 2018-01-28 DIAGNOSIS — M1611 Unilateral primary osteoarthritis, right hip: Secondary | ICD-10-CM | POA: Diagnosis not present

## 2018-01-28 DIAGNOSIS — M545 Low back pain: Secondary | ICD-10-CM | POA: Diagnosis not present

## 2018-02-09 DIAGNOSIS — R69 Illness, unspecified: Secondary | ICD-10-CM | POA: Diagnosis not present

## 2018-02-13 DIAGNOSIS — M9905 Segmental and somatic dysfunction of pelvic region: Secondary | ICD-10-CM | POA: Diagnosis not present

## 2018-02-13 DIAGNOSIS — M9903 Segmental and somatic dysfunction of lumbar region: Secondary | ICD-10-CM | POA: Diagnosis not present

## 2018-02-13 DIAGNOSIS — M4306 Spondylolysis, lumbar region: Secondary | ICD-10-CM | POA: Diagnosis not present

## 2018-02-13 DIAGNOSIS — M5441 Lumbago with sciatica, right side: Secondary | ICD-10-CM | POA: Diagnosis not present

## 2018-02-15 ENCOUNTER — Other Ambulatory Visit: Payer: Self-pay | Admitting: Nurse Practitioner

## 2018-02-16 DIAGNOSIS — M9903 Segmental and somatic dysfunction of lumbar region: Secondary | ICD-10-CM | POA: Diagnosis not present

## 2018-02-16 DIAGNOSIS — M4306 Spondylolysis, lumbar region: Secondary | ICD-10-CM | POA: Diagnosis not present

## 2018-02-16 DIAGNOSIS — M9905 Segmental and somatic dysfunction of pelvic region: Secondary | ICD-10-CM | POA: Diagnosis not present

## 2018-02-16 DIAGNOSIS — M5441 Lumbago with sciatica, right side: Secondary | ICD-10-CM | POA: Diagnosis not present

## 2018-02-16 MED ORDER — FLECAINIDE ACETATE 50 MG PO TABS: 50.0000 mg | ORAL_TABLET | Freq: Two times a day (BID) | ORAL | 2 refills | Status: DC

## 2018-02-18 ENCOUNTER — Other Ambulatory Visit: Payer: Self-pay | Admitting: Cardiovascular Disease

## 2018-02-18 DIAGNOSIS — M5441 Lumbago with sciatica, right side: Secondary | ICD-10-CM | POA: Diagnosis not present

## 2018-02-18 DIAGNOSIS — M9905 Segmental and somatic dysfunction of pelvic region: Secondary | ICD-10-CM | POA: Diagnosis not present

## 2018-02-18 DIAGNOSIS — M9903 Segmental and somatic dysfunction of lumbar region: Secondary | ICD-10-CM | POA: Diagnosis not present

## 2018-02-18 DIAGNOSIS — M4306 Spondylolysis, lumbar region: Secondary | ICD-10-CM | POA: Diagnosis not present

## 2018-02-18 NOTE — Telephone Encounter (Signed)
Please review for refill, Thanks !  

## 2018-02-19 DIAGNOSIS — M9903 Segmental and somatic dysfunction of lumbar region: Secondary | ICD-10-CM | POA: Diagnosis not present

## 2018-02-19 DIAGNOSIS — M5441 Lumbago with sciatica, right side: Secondary | ICD-10-CM | POA: Diagnosis not present

## 2018-02-19 DIAGNOSIS — M9905 Segmental and somatic dysfunction of pelvic region: Secondary | ICD-10-CM | POA: Diagnosis not present

## 2018-02-19 DIAGNOSIS — M4306 Spondylolysis, lumbar region: Secondary | ICD-10-CM | POA: Diagnosis not present

## 2018-02-21 DIAGNOSIS — M5441 Lumbago with sciatica, right side: Secondary | ICD-10-CM | POA: Diagnosis not present

## 2018-02-21 DIAGNOSIS — M4306 Spondylolysis, lumbar region: Secondary | ICD-10-CM | POA: Diagnosis not present

## 2018-02-21 DIAGNOSIS — M9903 Segmental and somatic dysfunction of lumbar region: Secondary | ICD-10-CM | POA: Diagnosis not present

## 2018-02-21 DIAGNOSIS — M9905 Segmental and somatic dysfunction of pelvic region: Secondary | ICD-10-CM | POA: Diagnosis not present

## 2018-02-23 DIAGNOSIS — M4306 Spondylolysis, lumbar region: Secondary | ICD-10-CM | POA: Diagnosis not present

## 2018-02-23 DIAGNOSIS — M9905 Segmental and somatic dysfunction of pelvic region: Secondary | ICD-10-CM | POA: Diagnosis not present

## 2018-02-23 DIAGNOSIS — M5441 Lumbago with sciatica, right side: Secondary | ICD-10-CM | POA: Diagnosis not present

## 2018-02-23 DIAGNOSIS — M9903 Segmental and somatic dysfunction of lumbar region: Secondary | ICD-10-CM | POA: Diagnosis not present

## 2018-02-26 DIAGNOSIS — M25551 Pain in right hip: Secondary | ICD-10-CM | POA: Diagnosis not present

## 2018-02-26 DIAGNOSIS — M1611 Unilateral primary osteoarthritis, right hip: Secondary | ICD-10-CM | POA: Diagnosis not present

## 2018-03-03 DIAGNOSIS — M25551 Pain in right hip: Secondary | ICD-10-CM | POA: Diagnosis not present

## 2018-03-09 DIAGNOSIS — H26491 Other secondary cataract, right eye: Secondary | ICD-10-CM | POA: Diagnosis not present

## 2018-03-17 ENCOUNTER — Other Ambulatory Visit: Payer: Self-pay | Admitting: Family Medicine

## 2018-03-27 ENCOUNTER — Other Ambulatory Visit: Payer: Self-pay | Admitting: Internal Medicine

## 2018-03-30 ENCOUNTER — Other Ambulatory Visit: Payer: Self-pay | Admitting: Internal Medicine

## 2018-03-30 DIAGNOSIS — M1611 Unilateral primary osteoarthritis, right hip: Secondary | ICD-10-CM | POA: Diagnosis not present

## 2018-03-30 DIAGNOSIS — M25551 Pain in right hip: Secondary | ICD-10-CM | POA: Diagnosis not present

## 2018-03-31 DIAGNOSIS — R69 Illness, unspecified: Secondary | ICD-10-CM | POA: Diagnosis not present

## 2018-04-02 ENCOUNTER — Ambulatory Visit: Payer: Medicare HMO | Admitting: Internal Medicine

## 2018-04-04 ENCOUNTER — Other Ambulatory Visit: Payer: Self-pay | Admitting: Internal Medicine

## 2018-04-09 ENCOUNTER — Telehealth: Payer: Self-pay | Admitting: Family Medicine

## 2018-04-09 NOTE — Telephone Encounter (Signed)
Pt dropped off surgical clearance form. Placed in rx tower. Pt has appt on 2/11 for surgical clearance.

## 2018-04-09 NOTE — Telephone Encounter (Signed)
Noted  

## 2018-04-13 ENCOUNTER — Encounter: Payer: Self-pay | Admitting: Internal Medicine

## 2018-04-13 ENCOUNTER — Ambulatory Visit: Payer: Medicare HMO | Admitting: Internal Medicine

## 2018-04-13 VITALS — BP 116/64 | HR 72 | Ht 65.0 in | Wt 166.6 lb

## 2018-04-13 DIAGNOSIS — G4733 Obstructive sleep apnea (adult) (pediatric): Secondary | ICD-10-CM | POA: Diagnosis not present

## 2018-04-13 DIAGNOSIS — I4819 Other persistent atrial fibrillation: Secondary | ICD-10-CM

## 2018-04-13 DIAGNOSIS — I10 Essential (primary) hypertension: Secondary | ICD-10-CM | POA: Diagnosis not present

## 2018-04-13 DIAGNOSIS — I340 Nonrheumatic mitral (valve) insufficiency: Secondary | ICD-10-CM

## 2018-04-13 NOTE — Progress Notes (Signed)
PCP: Jinny Sanders, MD Primary Cardiologist:  Dr Fletcher Anon Primary EP: Dr Rayann Heman  [name] is a 73 y.o. female who presents today for routine electrophysiology followup.  Since last being seen in our clinic, the patient reports doing very well.  Today, she denies symptoms of palpitations, chest pain, shortness of breath,  lower extremity edema, dizziness, presyncope, or syncope.  her primary concern is with hip pain.  She is scheduled by surgery with Dr Alvan Dame in March.  The patient is otherwise without complaint today.   Past Medical History:  Diagnosis Date  . Anemia   . Anxiety   . Colon polyps   . Constipation   . Depressive disorder, not elsewhere classified   . Diaphragmatic hernia without mention of obstruction or gangrene   . Disorders of bursae and tendons in shoulder region, unspecified   . Diverticulosis of colon (without mention of hemorrhage)   . External hemorrhoid   . Fundic gland polyps of stomach, benign   . GERD (gastroesophageal reflux disease)   . History of hiatal hernia   . HTN (hypertension)   . Hypothyroidism   . Internal hemorrhoids without mention of complication   . Obesity, unspecified   . OSA on CPAP    compliant with CPAP  . Other urinary incontinence   . Persistent atrial fibrillation   . Pure hypercholesterolemia   . Type II diabetes mellitus ([place])   . Urinary frequency   . Urinary urgency   . Vitamin B 12 deficiency    Past Surgical History:  Procedure Laterality Date  . ABDOMINAL HYSTERECTOMY  1972   Have both ovaries  . ATRIAL FIBRILLATION ABLATION  12/17/2016  . ATRIAL FIBRILLATION ABLATION N/A 12/17/2016   Procedure: ATRIAL FIBRILLATION ABLATION;  Surgeon: Thompson Grayer, MD;  Location: [place] [facility];  Service: Cardiovascular;  Laterality: N/A;  . [place]   [facility]; "Dr. [name]"  . CARDIOVERSION N/A 05/27/2016   Procedure: CARDIOVERSION;  Surgeon: [name], MD;  Location: [facility];  Service:  Cardiovascular;  Laterality: N/A;  . CARDIOVERSION N/A 11/25/2016   Procedure: CARDIOVERSION;  Surgeon: [name], MD;  Location: [facility];  Service: Cardiovascular;  Laterality: N/A;  . CATARACT EXTRACTION W/ INTRAOCULAR LENS  IMPLANT, BILATERAL Bilateral   . COLONOSCOPY    . ELECTROPHYSIOLOGIC STUDY N/A 01/24/2016   Procedure: CARDIOVERSION;  Surgeon: [name], MD;  Location: [facility];  Service: Cardiovascular;  Laterality: N/A;  . LAPAROSCOPIC CHOLECYSTECTOMY    . REDUCTION MAMMAPLASTY Bilateral   . TONSILLECTOMY      ROS- all systems are reviewed and negatives except as per HPI above  Current Outpatient Medications  Medication Sig Dispense Refill  . atorvastatin (LIPITOR) 10 MG tablet TAKE ONE TABLET BY MOUTH EVERY NIGHT AT BEDTIME 90 tablet 3  . Calcium Carb-Ergocalciferol 500-200 MG-UNIT TABS Take 1 tablet by mouth daily.     [name] diltiazem (CARDIZEM CD) 120 MG 24 hr capsule TAKE 1 CAPSULE BY MOUTH EVERY DAY 90 capsule 2  . ELIQUIS 5 MG TABS tablet TAKE 1 TABLET BY MOUTH TWICE A DAY 60 tablet 5  . escitalopram (LEXAPRO) 10 MG tablet TAKE 1 TABLET BY MOUTH ONCE DAILY 90 tablet 0  . ferrous sulfate 325 (65 FE) MG tablet Take 325 mg by mouth every other day.    . fesoterodine (TOVIAZ) 4 MG TB24 tablet Take 1 tablet (4 mg total) by mouth daily. 30 tablet 11  . flecainide (TAMBOCOR) 50 MG  tablet Take 1 tablet (50 mg total) by mouth 2 (two) times daily. 180 tablet 2  . metFORMIN (GLUCOPHAGE-XR) 500 MG 24 hr tablet TAKE 4 TABLETS BY MOUTH IN THE EVENING WITH DINNER 360 tablet 0  . Multiple Vitamin (MULTIVITAMIN) tablet Take 1 tablet by mouth daily.      . sennosides-docusate sodium (SENOKOT-S) 8.6-50 MG tablet Take 1 tablet by mouth daily as needed for constipation.    . TRADJENTA 5 MG TABS tablet TAKE 1 TABLET BY MOUTH EVERY DAY 30 tablet 1  . traZODone (DESYREL) 50 MG tablet TAKE 1-2 TABLETS (50-100 MG TOTAL) BY MOUTH AT BEDTIME AS NEEDED FOR SLEEP. 180 tablet 1   Current  Facility-Administered Medications  Medication Dose Route Frequency Provider Last Rate Last Dose  . 0.9 %  sodium chloride infusion  500 mL Intravenous Once Gatha Mayer, MD        Physical Exam: Vitals:   04/13/18 0919  BP: 116/64  Pulse: 72  SpO2: 97%  Weight: 166 lb 9.6 oz (75.6 kg)  Height: 5\' 5"  (1.651 m)    GEN- The patient is well appearing, alert and oriented x 3 today.   Head- normocephalic, atraumatic Eyes-  Sclera clear, conjunctiva pink Ears- hearing intact Oropharynx- clear Lungs- Clear to ausculation bilaterally, normal work of breathing Heart- Regular rate and rhythm, 1/6 holosystolic murmur at the apex GI- soft, NT, ND, + BS Extremities- no clubbing, cyanosis, or edema  Wt Readings from Last 3 Encounters:  04/13/18 166 lb 9.6 oz (75.6 kg)  12/30/17 172 lb (78 kg)  12/01/17 175 lb (79.4 kg)    EKG tracing ordered today is personally reviewed and shows sinus rhythm, normal ekg  Assessment and Plan:  1. Persistent afib Doing well post ablation  chads2vasc score is 4.  She is on eliquis I have again advised that she stop flecainide as she is doing so well.  She remains reluctant.  She will consider reducing her flecainide to 25mg  BID after her hip surgery.  2. HTN Stable No change required today  3. Moderate MR Echo 10/10/2017 is reviewed She has preserved EF Moderate MR, directed posteriorly with moderate LA enlargement.  LVEDP 55 mm Will need close follow-up by Dr Fletcher Anon as this may become surgical with time.  4. OSA Compliant with CPAP  5. preop Plans for upcoming hip surgery are noted.  Ok to proceed without additional CV workup.  She can hold eliquis 48 hours prior to the procedure and resume post operatively when able.  Follow-up with Dr Fletcher Anon in 6 months to follow up on her MR I will see in a year  Thompson Grayer MD, [facility] 04/13/2018 9:52 AM

## 2018-04-13 NOTE — Patient Instructions (Addendum)
Medication Instructions:  Your physician recommends that you continue on your current medications as directed. Please refer to the Current Medication list given to you today.  Dr. Rayann Heman suggests your reduce your Flecainide to 25 mg by mouth twice a day.   Labwork: None ordered.  Testing/Procedures: None ordered.  Follow-Up:  Your physician wants you to follow-up in: 6 months with Dr. Fletcher Anon.   You will receive a reminder letter in the mail two months in advance. If you don't receive a letter, please call our office to schedule the follow-up appointment.  Your physician wants you to follow-up in: one year with Dr. Rayann Heman.   You will receive a reminder letter in the mail two months in advance. If you don't receive a letter, please call our office to schedule the follow-up appointment.  Any Other Special Instructions Will Be Listed Below (If Applicable).  If you need a refill on your cardiac medications before your next appointment, please call your pharmacy.

## 2018-04-14 ENCOUNTER — Ambulatory Visit: Payer: Medicare HMO | Admitting: Family Medicine

## 2018-04-14 NOTE — Addendum Note (Signed)
Addended by: Willeen Cass A on: 04/14/2018 11:45 AM   Modules accepted: Orders

## 2018-04-16 ENCOUNTER — Ambulatory Visit (INDEPENDENT_AMBULATORY_CARE_PROVIDER_SITE_OTHER): Payer: Medicare HMO | Admitting: Family Medicine

## 2018-04-16 ENCOUNTER — Telehealth: Payer: Self-pay | Admitting: *Deleted

## 2018-04-16 ENCOUNTER — Encounter: Payer: Self-pay | Admitting: Family Medicine

## 2018-04-16 VITALS — BP 100/54 | HR 75 | Temp 97.8°F | Resp 18 | Ht 65.0 in | Wt 164.4 lb

## 2018-04-16 DIAGNOSIS — D509 Iron deficiency anemia, unspecified: Secondary | ICD-10-CM | POA: Diagnosis not present

## 2018-04-16 DIAGNOSIS — E119 Type 2 diabetes mellitus without complications: Secondary | ICD-10-CM | POA: Diagnosis not present

## 2018-04-16 DIAGNOSIS — I4819 Other persistent atrial fibrillation: Secondary | ICD-10-CM | POA: Diagnosis not present

## 2018-04-16 DIAGNOSIS — Z01818 Encounter for other preprocedural examination: Secondary | ICD-10-CM | POA: Diagnosis not present

## 2018-04-16 DIAGNOSIS — I10 Essential (primary) hypertension: Secondary | ICD-10-CM

## 2018-04-16 DIAGNOSIS — I340 Nonrheumatic mitral (valve) insufficiency: Secondary | ICD-10-CM

## 2018-04-16 DIAGNOSIS — R8290 Unspecified abnormal findings in urine: Secondary | ICD-10-CM

## 2018-04-16 DIAGNOSIS — Z794 Long term (current) use of insulin: Secondary | ICD-10-CM | POA: Diagnosis not present

## 2018-04-16 LAB — CBC WITH DIFFERENTIAL/PLATELET
BASOS PCT: 0.2 % (ref 0.0–3.0)
Basophils Absolute: 0 10*3/uL (ref 0.0–0.1)
EOS PCT: 0.6 % (ref 0.0–5.0)
Eosinophils Absolute: 0 10*3/uL (ref 0.0–0.7)
HCT: 35.8 % — ABNORMAL LOW (ref 36.0–46.0)
Hemoglobin: 12 g/dL (ref 12.0–15.0)
Lymphocytes Relative: 18.7 % (ref 12.0–46.0)
Lymphs Abs: 1.2 10*3/uL (ref 0.7–4.0)
MCHC: 33.6 g/dL (ref 30.0–36.0)
MCV: 92 fl (ref 78.0–100.0)
MONO ABS: 0.4 10*3/uL (ref 0.1–1.0)
Monocytes Relative: 6.3 % (ref 3.0–12.0)
Neutro Abs: 4.8 10*3/uL (ref 1.4–7.7)
Neutrophils Relative %: 74.2 % (ref 43.0–77.0)
PLATELETS: 324 10*3/uL (ref 150.0–400.0)
RBC: 3.89 Mil/uL (ref 3.87–5.11)
RDW: 13 % (ref 11.5–15.5)
WBC: 6.5 10*3/uL (ref 4.0–10.5)

## 2018-04-16 LAB — POCT GLYCOSYLATED HEMOGLOBIN (HGB A1C): HEMOGLOBIN A1C: 6.2 % — AB (ref 4.0–5.6)

## 2018-04-16 LAB — ALBUMIN: ALBUMIN: 4.1 g/dL (ref 3.5–5.2)

## 2018-04-16 LAB — POCT URINALYSIS DIPSTICK
Bilirubin, UA: NEGATIVE
Glucose, UA: NEGATIVE
Ketones, UA: NEGATIVE
Nitrite, UA: POSITIVE
Protein, UA: POSITIVE — AB
Spec Grav, UA: 1.025 (ref 1.010–1.025)
Urobilinogen, UA: 0.2 E.U./dL
pH, UA: 6 (ref 5.0–8.0)

## 2018-04-16 NOTE — Assessment & Plan Note (Signed)
Well controlled. Continue current medication.  

## 2018-04-16 NOTE — Assessment & Plan Note (Signed)
Reviewed Cardiology note and recommendations on eliquis

## 2018-04-16 NOTE — Patient Instructions (Signed)
Please stop at the lab to have labs drawn.  Stop Eliquis as directed by Dr. Rayann Heman: 48 hours prior to surgery.

## 2018-04-16 NOTE — Progress Notes (Signed)
Subjective:    Patient ID: [name], female    DOB: [birth year], 73 y.o.   MRN: [id number]  HPI   73 year old female pt presents for preoperative clearance.  Given her atrial fibrillation, she has recently seen Dr.  Bryson Ha cardiology and has been cleared from his viewpoint.  She is schedule on 3/10 for a total hip arthroplasty by Dr. Alvan Dame.  She has no complaints today  other than hip pain.  Hx of iron def anemia... Hg 11.4 at last check 07/2017  Diabetes:   She had excellent control of her diabetes at the last check in 11/2917, 5 months ago. Followed by Dr. Cruzita Lederer ENDO Lab Results  Component Value Date   HGBA1C 6.4 (A) 12/01/2017  Using medications without difficulties: Hypoglycemic episodes:none Hyperglycemic episodes:  300 after prednisone shot 3-4 months ago. Feet problems: no ulcers Blood Sugars averaging: FBS 109-120 eye exam within last year:  HTN: blood pressure well controlled in office today at at home.  No CP, no SOB, no lightheadedness , no edema   BP Readings from Last 3 Encounters:  04/16/18 (!) 100/54  04/13/18 116/64  12/31/17 136/77     Social History /Family History/Past Medical History reviewed in detail and updated in EMR if needed. Blood pressure (!) 100/54, pulse 75, temperature 97.8 F (36.6 C), temperature source Oral, resp. rate 18, height 5\' 5"  (1.651 m), weight 164 lb 6.4 oz (74.6 kg), SpO2 95 %.   Review of Systems  Constitutional: Negative for fatigue, fever and unexpected weight change.  HENT: Negative for congestion, ear pain, sinus pressure, sneezing, sore throat and trouble swallowing.   Eyes: Negative for pain and itching.  Respiratory: Negative for cough, shortness of breath and wheezing.   Cardiovascular: Negative for chest pain, palpitations and leg swelling.  Gastrointestinal: Negative for abdominal pain, blood in stool, constipation, diarrhea and nausea.  Genitourinary: Negative for difficulty urinating, dysuria,  hematuria, menstrual problem and vaginal discharge.  Skin: Negative for rash.  Neurological: Negative for syncope, weakness, light-headedness, numbness and headaches.  Psychiatric/Behavioral: Negative for confusion and dysphoric mood. The patient is not nervous/anxious.        Objective:   Physical Exam Constitutional:      General: She is not in acute distress.    Appearance: Normal appearance. She is well-developed. She is not ill-appearing or toxic-appearing.  HENT:     Head: Normocephalic.     Right Ear: Hearing, tympanic membrane, ear canal and external ear normal.     Left Ear: Hearing, tympanic membrane, ear canal and external ear normal.     Nose: Nose normal.  Eyes:     General: Lids are normal. Lids are everted, no foreign bodies appreciated.     Conjunctiva/sclera: Conjunctivae normal.     Pupils: Pupils are equal, round, and reactive to light.  Neck:     Musculoskeletal: Normal range of motion and neck supple.     Thyroid: No thyroid mass or thyromegaly.     Vascular: No carotid bruit.     Trachea: Trachea normal.  Cardiovascular:     Rate and Rhythm: Normal rate and regular rhythm.     Heart sounds: S1 normal and S2 normal. Murmur present. Systolic murmur present with a grade of 1/6. No gallop.   Pulmonary:     Effort: Pulmonary effort is normal. No respiratory distress.     Breath sounds: Normal breath sounds. No wheezing, rhonchi or rales.  Abdominal:     General:  Bowel sounds are normal. There is no distension or abdominal bruit.     Palpations: Abdomen is soft. There is no fluid wave or mass.     Tenderness: There is no abdominal tenderness. There is no guarding or rebound.     Hernia: No hernia is present.  Lymphadenopathy:     Cervical: No cervical adenopathy.  Skin:    General: Skin is warm and dry.     Findings: No rash.  Neurological:     Mental Status: She is alert.     Cranial Nerves: No cranial nerve deficit.     Sensory: No sensory deficit.    Psychiatric:        Mood and Affect: Mood is not anxious or depressed.        Speech: Speech normal.        Behavior: Behavior normal. Behavior is cooperative.        Judgment: Judgment normal.           Assessment & Plan:

## 2018-04-16 NOTE — Telephone Encounter (Signed)
Orders have been placed. The patient will be put on the recall list for August.

## 2018-04-16 NOTE — Assessment & Plan Note (Deleted)
Awaiting lab results, but pt with well controlled chronic health problems and at low moderate risk for complications in upcoming surgery. Expect once results return... clearance to be given.

## 2018-04-16 NOTE — Assessment & Plan Note (Signed)
Re-eval cbc pre op.

## 2018-04-16 NOTE — Assessment & Plan Note (Signed)
Well-controlled on current regimen. ?

## 2018-04-16 NOTE — Addendum Note (Signed)
Addended by: Benson Setting L on: 04/16/2018 09:50 AM   Modules accepted: Orders

## 2018-04-16 NOTE — Assessment & Plan Note (Signed)
Awaiting lab results, but pt with well controlled chronic health problems and at low moderate risk for complications in upcoming surgery. Expect once results return... clearance to be given.

## 2018-04-16 NOTE — Telephone Encounter (Addendum)
-----   Message from [name], MD sent at 04/13/2018  4:55 PM EST -----   Lattie Haw, Please schedule this patient for an echocardiogram in August (for mitral regurgitation) and follow-up with me after echo.

## 2018-04-18 LAB — URINE CULTURE
MICRO NUMBER:: 191886
SPECIMEN QUALITY:: ADEQUATE

## 2018-04-19 ENCOUNTER — Other Ambulatory Visit: Payer: Self-pay | Admitting: Family Medicine

## 2018-04-19 MED ORDER — SULFAMETHOXAZOLE-TRIMETHOPRIM 800-160 MG PO TABS: 1.0000 | ORAL_TABLET | Freq: Two times a day (BID) | ORAL | 0 refills | Status: DC

## 2018-04-27 ENCOUNTER — Other Ambulatory Visit (INDEPENDENT_AMBULATORY_CARE_PROVIDER_SITE_OTHER): Payer: Medicare HMO

## 2018-04-27 DIAGNOSIS — R8279 Other abnormal findings on microbiological examination of urine: Secondary | ICD-10-CM | POA: Diagnosis not present

## 2018-04-27 NOTE — H&P (Signed)
TOTAL HIP ADMISSION H&P  Patient is admitted for right total hip arthroplasty, anterior approach.  Subjective:  Chief Complaint:   Right hip primary OA / pain  HPI: [name], 73 y.o. female, has a history of pain and functional disability in the right hip(s) due to arthritis and patient has failed non-surgical conservative treatments for greater than 12 weeks to include NSAID's and/or analgesics, corticosteriod injections and activity modification.  Onset of symptoms was gradual starting 6+ months ago with gradually worsening course since that time.The patient noted no past surgery on the right hip(s).  Patient currently rates pain in the right hip at 10 out of 10 with activity. Patient has worsening of pain with activity and weight bearing, trendelenberg gait, pain that interfers with activities of daily living and pain with passive range of motion. Patient has evidence of periarticular osteophytes and joint space narrowing by imaging studies. This condition presents safety issues increasing the risk of falls.  There is no current active infection.  Risks, benefits and expectations were discussed with the patient.  Risks including but not limited to the risk of anesthesia, blood clots, nerve damage, blood vessel damage, failure of the prosthesis, infection and up to and including death.  Patient understand the risks, benefits and expectations and wishes to proceed with surgery.   PCP: Jinny Sanders, MD  D/C Plans:       Home   Post-op Meds:       No Rx given  Tranexamic Acid:      To be given - IV   Decadron:      Is to be given  FYI:      Eliquis  Norco  DME:   Pt already has equipment   PT:    No PT   Pharmacy: [facility]   Patient Active Problem List   Diagnosis Date Noted  . Vitamin B 12 deficiency   . Urge incontinence   . OSA on CPAP   . HTN (hypertension)   . History of hiatal hernia   . GERD (gastroesophageal reflux disease)   . Anxiety   . Other  constipation 01/02/2017  . Class 1 obesity with serious comorbidity and body mass index (BMI) of 30.0 to 30.9 in adult 01/02/2017  . Overweight (BMI 25.0-29.9) 12/19/2016  . Persistent atrial fibrillation 12/17/2016  . Vitamin D deficiency 11/20/2016  . Family history of ovarian cancer 05/24/2016  . Iron deficiency anemia   . Essential hypertension   . Preoperative evaluation to rule out surgical contraindication 05/16/2015  . Controlled diabetes mellitus type II without complication ([place]) 14/97/0263  . Mitral regurgitation 10/11/2013  . Incidental lung nodule 07/16/2011  . 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 SLEEP APNEA 10/25/2008  . Hypercholesterolemia 06/11/2006  . Depression, major, in remission ([place]) 06/11/2006  . HEMORRHOIDS, INTERNAL 06/11/2006  . DIVERTICULOSIS, COLON 06/11/2006  . STRESS INCONTINENCE 06/11/2006   Past Medical History:  Diagnosis Date  . Anemia   . Anxiety   . Colon polyps   . Constipation   . Depressive disorder, not elsewhere classified   . Diaphragmatic hernia without mention of obstruction or gangrene   . Disorders of bursae and tendons in shoulder region, unspecified   . Diverticulosis of colon (without mention of hemorrhage)   . External hemorrhoid   . Fundic gland polyps of stomach, benign   . GERD (gastroesophageal reflux disease)   . History of hiatal hernia   . HTN (hypertension)   . Hypothyroidism   .  Internal hemorrhoids without mention of complication   . Moderate mitral regurgitation   . Obesity, unspecified   . OSA on CPAP    compliant with CPAP  . Other urinary incontinence   . Persistent atrial fibrillation   . Pure hypercholesterolemia   . Type II diabetes mellitus ([place])   . Urinary frequency   . Urinary urgency   . Vitamin B 12 deficiency     Past Surgical History:  Procedure Laterality Date  . ABDOMINAL HYSTERECTOMY  1972   Have both ovaries  . ATRIAL FIBRILLATION ABLATION  12/17/2016  . ATRIAL FIBRILLATION ABLATION N/A  12/17/2016   Procedure: ATRIAL FIBRILLATION ABLATION;  Surgeon: Thompson Grayer, MD;  Location: [facility];  Service: Cardiovascular;  Laterality: N/A;  . [facility]; "Dr. [name]"  . CARDIOVERSION N/A 05/27/2016   Procedure: CARDIOVERSION;  Surgeon: [name], MD;  Location: [facility];  Service: Cardiovascular;  Laterality: N/A;  . CARDIOVERSION N/A 11/25/2016   Procedure: CARDIOVERSION;  Surgeon: [name], MD;  Location: [facility];  Service: Cardiovascular;  Laterality: N/A;  . CATARACT EXTRACTION W/ INTRAOCULAR LENS  IMPLANT, BILATERAL Bilateral   . COLONOSCOPY    . ELECTROPHYSIOLOGIC STUDY N/A 01/24/2016   Procedure: CARDIOVERSION;  Surgeon: [name], MD;  Location: [facility];  Service: Cardiovascular;  Laterality: N/A;  . LAPAROSCOPIC CHOLECYSTECTOMY    . REDUCTION MAMMAPLASTY Bilateral   . TONSILLECTOMY      Current Facility-Administered Medications  Medication Dose Route Frequency Provider Last Rate Last Dose  . 0.9 %  sodium chloride infusion  500 mL Intravenous Once Gatha Mayer, MD       Current Outpatient Medications  Medication Sig Dispense Refill Last Dose  . atorvastatin (LIPITOR) 10 MG tablet TAKE ONE TABLET BY MOUTH EVERY NIGHT AT BEDTIME 90 tablet 3 Taking  . Calcium Carb-Ergocalciferol 500-200 MG-UNIT TABS Take 1 tablet by mouth daily.    Taking  . diltiazem (CARDIZEM CD) 120 MG 24 hr capsule TAKE 1 CAPSULE BY MOUTH EVERY DAY 90 capsule 2 Taking  . ELIQUIS 5 MG TABS tablet TAKE 1 TABLET BY MOUTH TWICE A DAY 60 tablet 5 Taking  . escitalopram (LEXAPRO) 10 MG tablet TAKE 1 TABLET BY MOUTH ONCE DAILY 90 tablet 0 Taking  . ferrous sulfate 325 (65 FE) MG tablet Take 325 mg by mouth every other day.   Taking  . fesoterodine (TOVIAZ) 4 MG TB24 tablet Take 1 tablet (4 mg total) by mouth daily. 30 tablet 11 Taking  . flecainide (TAMBOCOR) 50 MG tablet Take 1 tablet (50 mg total) by mouth 2 (two) times daily. 180 tablet 2 Taking   . metFORMIN (GLUCOPHAGE-XR) 500 MG 24 hr tablet TAKE 4 TABLETS BY MOUTH IN THE EVENING WITH DINNER 360 tablet 0 Taking  . Multiple Vitamin (MULTIVITAMIN) tablet Take 1 tablet by mouth daily.     Taking  . sennosides-docusate sodium (SENOKOT-S) 8.6-50 MG tablet Take 1 tablet by mouth daily as needed for constipation.   Taking  . sulfamethoxazole-trimethoprim (BACTRIM DS,SEPTRA DS) 800-160 MG tablet Take 1 tablet by mouth 2 (two) times daily. 6 tablet 0   . TRADJENTA 5 MG TABS tablet TAKE 1 TABLET BY MOUTH EVERY DAY 30 tablet 1 Taking  . traZODone (DESYREL) 50 MG tablet TAKE 1-2 TABLETS (50-100 MG TOTAL) BY MOUTH AT BEDTIME AS NEEDED FOR SLEEP. 180 tablet 1 Taking   Allergies  Allergen Reactions  . Pravachol [Pravastatin Sodium]  Leg pain   . Contrast Media [Iodinated Diagnostic Agents] Rash    Social History   Tobacco Use  . Smoking status: Never Smoker  . Smokeless tobacco: Never Used  Substance Use Topics  . Alcohol use: No    Alcohol/week: 0.0 standard drinks    Family History  Problem Relation Age of Onset  . Cancer Mother        fallopian tube  . Dementia Mother   . Pulmonary embolism Mother   . Hyperlipidemia Mother   . Diabetes Mother   . Hyperlipidemia Father   . Lung cancer Father   . Hypertension Sister   . Diabetes Brother   . Stroke Paternal Grandmother   . Stroke Paternal Grandfather   . Colon cancer Neg Hx   . Esophageal cancer Neg Hx      Review of Systems  Constitutional: Negative.   HENT: Negative.   Eyes: Negative.   Respiratory: Negative.   Cardiovascular: Negative.   Gastrointestinal: Negative.   Genitourinary: Positive for frequency.  Musculoskeletal: Positive for joint pain.  Skin: Negative.   Neurological: Negative.   Endo/Heme/Allergies: Negative.   Psychiatric/Behavioral: Negative.     Objective:  Physical Exam  Constitutional: She is oriented to person, place, and time. She appears well-developed.  HENT:  Head: Normocephalic.   Eyes: Pupils are equal, round, and reactive to light.  Neck: Neck supple. No JVD present. No tracheal deviation present. No thyromegaly present.  Cardiovascular: Normal rate, regular rhythm and intact distal pulses.  a-fib  Respiratory: Effort normal and breath sounds normal. No respiratory distress. She has no wheezes.  GI: Soft. There is no abdominal tenderness. There is no guarding.  Musculoskeletal:     Right hip: She exhibits decreased range of motion, decreased strength, tenderness and bony tenderness. She exhibits no swelling, no deformity and no laceration.  Lymphadenopathy:    She has no cervical adenopathy.  Neurological: She is alert and oriented to person, place, and time.  Skin: Skin is warm and dry.  Psychiatric: She has a normal mood and affect.     Labs:  Estimated body mass index is 27.36 kg/m as calculated from the following:   Height as of 04/16/18: 5\' 5"  (1.651 m).   Weight as of 04/16/18: 74.6 kg.   Imaging Review Plain radiographs demonstrate severe degenerative joint disease of the right hip. The bone quality appears to be good for age and reported activity level.      Assessment/Plan:  End stage arthritis, right hip  The patient history, physical examination, clinical judgement of the provider and imaging studies are consistent with end stage degenerative joint disease of the right hip and total hip arthroplasty is deemed medically necessary. The treatment options including medical management, injection therapy, arthroscopy and arthroplasty were discussed at length. The risks and benefits of total hip arthroplasty were presented and reviewed. The risks due to aseptic loosening, infection, stiffness, dislocation/subluxation,  thromboembolic complications and other imponderables were discussed.  The patient acknowledged the explanation, agreed to proceed with the plan and consent was signed. Patient is being admitted for inpatient treatment for surgery, pain  control, PT, OT, prophylactic antibiotics, VTE prophylaxis, progressive ambulation and ADL's and discharge planning.The patient is planning to be discharged home.    West Pugh Fadel Clason   PA-C  04/27/2018, 10:22 AM

## 2018-04-28 LAB — URINE CULTURE
MICRO NUMBER:: 233322
SPECIMEN QUALITY:: ADEQUATE

## 2018-04-30 ENCOUNTER — Ambulatory Visit (INDEPENDENT_AMBULATORY_CARE_PROVIDER_SITE_OTHER): Payer: Medicare HMO

## 2018-04-30 DIAGNOSIS — I340 Nonrheumatic mitral (valve) insufficiency: Secondary | ICD-10-CM | POA: Diagnosis not present

## 2018-05-01 ENCOUNTER — Ambulatory Visit: Payer: Medicare HMO | Admitting: Pulmonary Disease

## 2018-05-01 ENCOUNTER — Encounter: Payer: Self-pay | Admitting: Pulmonary Disease

## 2018-05-01 VITALS — BP 110/60 | HR 68 | Ht 65.0 in | Wt 167.0 lb

## 2018-05-01 DIAGNOSIS — G4733 Obstructive sleep apnea (adult) (pediatric): Secondary | ICD-10-CM

## 2018-05-01 NOTE — Progress Notes (Signed)
PULMONARY OFFICE FOLLOW UP NOTE  Requesting MD/Service: Fletcher Anon Date of initial consultation: 02/09/16 Reason for consultation: OSA  PT PROFILE: 73 y.o. F never smoker with history of OSA diagnosed 09/2008 referred by Dr Fletcher Anon for evaluation of persistent daytime hypersomnolence  DATA: Sleep study 09/14/08 [facility] AHI 32/hr with predominance of hypopneas. SpO2 nadir 86%. Echocardiogram 10/08/13: Normal LVEF and diastolic function, Mild MVP, moderate MR, trivial TR,  RVSP not estimated  Echocardiogram 01/22/16: Atrial fibirllation, LVEF 55-60%, mild MR, LA moderately dilated, mild to moderate TR, RVSP estimate 47 mmHg Echocardiogram 06/27/16: LVEF 55-60%. LA moderately dilated, RA mildly dilated. RVSP est. Normal CPAP compliance:  usage 30/30 nights, greater than 4 hours 18/30 nights.  INTERVAL: Suffered fall and severe facial injury in Oct.   SUBJ:  This is a scheduled annual follow-up for obstructive sleep apnea.  She suffered a fall in October 2019 with facial injury that prevented her from using CPAP consistently.  She is now just getting back into regular use.  She has a nasal mask which she tolerates well.  When she uses CPAP, she feels well rested.  She denies morning headache.  She denies significant daytime hypersomnolence.  She is scheduled for a total hip replacement in March of this year for degenerative hip disease.   Vitals:   05/01/18 0948 05/01/18 0951  BP:  110/60  Pulse:  68  SpO2:  98%  Weight: 167 lb (75.8 kg)   Height: 5\' 5"  (1.651 m)     EXAM:  Gen: NAD HEENT: NCAT, sclerae white Neck: No JVD Lungs: breath sounds full, no wheezes or other adventitious sounds Cardiovascular: RRR, no murmurs Abdomen: Soft, nontender, normal BS Ext: without clubbing, cyanosis, edema Neuro: grossly intact Skin: Limited exam, no lesions noted    DATA:   BMP Latest Ref Rng & Units 05/26/2017 12/17/2016 12/02/2016  Glucose 70 - 99 mg/dL 120(H) 139(H) 80  BUN 6 - 23 mg/dL 14  15 17   Creatinine 0.40 - 1.20 mg/dL 0.88 0.94 1.04(H)  BUN/Creat Ratio 12 - 28 - - 16  Sodium 135 - 145 mEq/L 138 139 139  Potassium 3.5 - 5.1 mEq/L 4.8 4.1 4.9  Chloride 96 - 112 mEq/L 102 106 100  CO2 19 - 32 mEq/L 29 26 25   Calcium 8.4 - 10.5 mg/dL 10.0 9.4 9.9    CBC Latest Ref Rng & Units 04/16/2018 09/19/2017 07/21/2017  WBC 4.0 - 10.5 K/uL 6.5 7.8 7.3  Hemoglobin 12.0 - 15.0 g/dL 12.0 11.6(L) 11.4(L)  Hematocrit 36.0 - 46.0 % 35.8(L) 34.8(L) 34.5(L)  Platelets 150.0 - 400.0 K/uL 324.0 279.0 282.0    CXR: no new film  IMPRESSION:     ICD-10-CM   1. OSA (obstructive sleep apnea) G47.33      PLAN:  Continue CPAP with sleep, AutoSet 5-20 cm H2O I recommended that she take her CPAP machine with her to the hospital for planned hip surgery Follow-up in 1 year.  Call sooner if needed   Merton Border, MD PCCM service Mobile [phone] Pager [phone] 05/01/2018 10:10 AM

## 2018-05-01 NOTE — Patient Instructions (Signed)
Continue CPAP with sleep, AutoSet 5-20 cm H2O Follow-up in 1 year.  Call sooner with any issues related to sleep apnea

## 2018-05-05 NOTE — Progress Notes (Signed)
05-01-18 (Epic) LOV w/Pulmonary  04-30-18 (Epic) ECHO  04-16-18 (Epic) LOV w/ Primary Care Provider  04-13-18 (Epic) Cardiac Clearance from Dr. Rayann Heman and EKG in chart.

## 2018-05-05 NOTE — Patient Instructions (Addendum)
JENESA FORESTA  05/05/2018   Your procedure is scheduled on: 05-12-18    Report to [facility] Main  Entrance    Report to Admitting at 9:00 AM    Call this number if you have problems the morning of surgery [phone]    Remember: Do not eat food or drink liquids :After Midnight.    BRUSH YOUR TEETH MORNING OF SURGERY AND RINSE YOUR MOUTH OUT, NO CHEWING GUM CANDY OR MINTS.     Take these medicines the morning of surgery with A SIP OF WATER: Diltiazem (Cardizem), Escitalopram (Lexapro), and Tambocor (Flecainide )                                 You may not have any metal on your body including hair pins and              piercings  Do not wear jewelry, make-up, lotions, powders or perfumes, deodorant             Do not wear nail polish.  Do not shave  48 hours prior to surgery.                Do not bring valuables to the hospital. [place].  Contacts, dentures or bridgework may not be worn into surgery.  Leave suitcase in the car. After surgery it may be brought to your room.     Patients discharged the day of surgery will not be allowed to drive home. IF YOU ARE HAVING SURGERY AND GOING HOME THE SAME DAY, YOU MUST HAVE AN ADULT TO DRIVE YOU HOME AND BE WITH YOU FOR 24 HOURS. YOU MAY GO HOME BY TAXI OR UBER OR ORTHERWISE, BUT AN ADULT MUST ACCOMPANY YOU HOME AND STAY WITH YOU FOR 24 HOURS.   Special Instructions: Please bring your mask and tubing for your CPAP machine              Please read over the following fact sheets you were given: _____________________________________________________________________             How to Manage Your Diabetes Before and After Surgery  Why is it important to control my blood sugar before and after surgery? . Improving blood sugar levels before and after surgery helps healing and can limit problems. . A way of improving blood sugar control is eating a healthy  diet by: o  Eating less sugar and carbohydrates o  Increasing activity/exercise o  Talking with your doctor about reaching your blood sugar goals . High blood sugars (greater than 180 mg/dL) can raise your risk of infections and slow your recovery, so you will need to focus on controlling your diabetes during the weeks before surgery. . Make sure that the doctor who takes care of your diabetes knows about your planned surgery including the date and location.  How do I manage my blood sugar before surgery? . Check your blood sugar at least 4 times a day, starting 2 days before surgery, to make sure that the level is not too high or low. o Check your blood sugar the morning of your surgery when you wake up and every 2 hours until you get to the Short Stay unit. . If  your blood sugar is less than 70 mg/dL, you will need to treat for low blood sugar: o Do not take insulin. o Treat a low blood sugar (less than 70 mg/dL) with  cup of clear juice (cranberry or apple), 4 glucose tablets, OR glucose gel. o Recheck blood sugar in 15 minutes after treatment (to make sure it is greater than 70 mg/dL). If your blood sugar is not greater than 70 mg/dL on recheck, call [phone] for further instructions. . Report your blood sugar to the short stay nurse when you get to Short Stay.  . If you are admitted to the hospital after surgery: o Your blood sugar will be checked by the staff and you will probably be given insulin after surgery (instead of oral diabetes medicines) to make sure you have good blood sugar levels. o The goal for blood sugar control after surgery is 80-180 mg/dL.   WHAT DO I DO ABOUT MY DIABETES MEDICATION?  [name] Do not take oral diabetes medicines (pills) the morning of surgery.  . THE DAY BEFORE SURGERY, take  Your usual dose of Metformin and Tradgenta.       Reviewed and Endorsed by [facility] Patient Education Committee, August 2015   [facility] - Preparing for Surgery Before  surgery, you can play an important role.  Because skin is not sterile, your skin needs to be as free of germs as possible.  You can reduce the number of germs on your skin by washing with CHG (chlorahexidine gluconate) soap before surgery.  CHG is an antiseptic cleaner which kills germs and bonds with the skin to continue killing germs even after washing. Please DO NOT use if you have an allergy to CHG or antibacterial soaps.  If your skin becomes reddened/irritated stop using the CHG and inform your nurse when you arrive at Short Stay. Do not shave (including legs and underarms) for at least 48 hours prior to the first CHG shower.  You may shave your face/neck. Please follow these instructions carefully:  1.  Shower with CHG Soap the night before surgery and the  morning of Surgery.  2.  If you choose to wash your hair, wash your hair first as usual with your  normal  shampoo.  3.  After you shampoo, rinse your hair and body thoroughly to remove the  shampoo.                           4.  Use CHG as you would any other liquid soap.  You can apply chg directly  to the skin and wash                       Gently with a scrungie or clean washcloth.  5.  Apply the CHG Soap to your body ONLY FROM THE NECK DOWN.   Do not use on face/ open                           Wound or open sores. Avoid contact with eyes, ears mouth and genitals (private parts).                       Wash face,  Genitals (private parts) with your normal soap.             6.  Wash thoroughly, paying special attention to the area where your  surgery  will be performed.  7.  Thoroughly rinse your body with warm water from the neck down.  8.  DO NOT shower/wash with your normal soap after using and rinsing off  the CHG Soap.                9.  Pat yourself dry with a clean towel.            10.  Wear clean pajamas.            11.  Place clean sheets on your bed the night of your first shower and do not  sleep with pets. Day of Surgery  : Do not apply any lotions/deodorants the morning of surgery.  Please wear clean clothes to the hospital/surgery center.  FAILURE TO FOLLOW THESE INSTRUCTIONS MAY RESULT IN THE CANCELLATION OF YOUR SURGERY PATIENT SIGNATURE_________________________________  NURSE SIGNATURE__________________________________  ________________________________________________________________________   Adam Phenix  An incentive spirometer is a tool that can help keep your lungs clear and active. This tool measures how well you are filling your lungs with each breath. Taking long deep breaths may help reverse or decrease the chance of developing breathing (pulmonary) problems (especially infection) following:  A long period of time when you are unable to move or be active. BEFORE THE PROCEDURE   If the spirometer includes an indicator to show your best effort, your nurse or respiratory therapist will set it to a desired goal.  If possible, sit up straight or lean slightly forward. Try not to slouch.  Hold the incentive spirometer in an upright position. INSTRUCTIONS FOR USE  1. Sit on the edge of your bed if possible, or sit up as far as you can in bed or on a chair. 2. Hold the incentive spirometer in an upright position. 3. Breathe out normally. 4. Place the mouthpiece in your mouth and seal your lips tightly around it. 5. Breathe in slowly and as deeply as possible, raising the piston or the ball toward the top of the column. 6. Hold your breath for 3-5 seconds or for as long as possible. Allow the piston or ball to fall to the bottom of the column. 7. Remove the mouthpiece from your mouth and breathe out normally. 8. Rest for a few seconds and repeat Steps 1 through 7 at least 10 times every 1-2 hours when you are awake. Take your time and take a few normal breaths between deep breaths. 9. The spirometer may include an indicator to show your best effort. Use the indicator as a goal to work  toward during each repetition. 10. After each set of 10 deep breaths, practice coughing to be sure your lungs are clear. If you have an incision (the cut made at the time of surgery), support your incision when coughing by placing a pillow or rolled up towels firmly against it. Once you are able to get out of bed, walk around indoors and cough well. You may stop using the incentive spirometer when instructed by your caregiver.  RISKS AND COMPLICATIONS  Take your time so you do not get dizzy or light-headed.  If you are in pain, you may need to take or ask for pain medication before doing incentive spirometry. It is harder to take a deep breath if you are having pain. AFTER USE  Rest and breathe slowly and easily.  It can be helpful to keep track of a log of your progress. Your caregiver can provide you with a simple table to  help with this. If you are using the spirometer at home, follow these instructions: [place] IF:   You are having difficultly using the spirometer.  You have trouble using the spirometer as often as instructed.  Your pain medication is not giving enough relief while using the spirometer.  You develop fever of 100.5 F (38.1 C) or higher. SEEK IMMEDIATE MEDICAL CARE IF:   You cough up bloody sputum that had not been present before.  You develop fever of 102 F (38.9 C) or greater.  You develop worsening pain at or near the incision site. MAKE SURE YOU:   Understand these instructions.  Will watch your condition.  Will get help right away if you are not doing well or get worse. Document Released: 07/01/2006 Document Revised: 05/13/2011 Document Reviewed: 09/01/2006 ExitCare Patient Information 2014 ExitCare, [place].   ________________________________________________________________________  WHAT IS A BLOOD TRANSFUSION? Blood Transfusion Information  A transfusion is the replacement of blood or some of its parts. Blood is made up of multiple  cells which provide different functions.  Red blood cells carry oxygen and are used for blood loss replacement.  White blood cells fight against infection.  Platelets control bleeding.  Plasma helps clot blood.  Other blood products are available for specialized needs, such as hemophilia or other clotting disorders. BEFORE THE TRANSFUSION  Who gives blood for transfusions?   Healthy volunteers who are fully evaluated to make sure their blood is safe. This is blood bank blood. Transfusion therapy is the safest it has ever been in the practice of medicine. Before blood is taken from a donor, a complete history is taken to make sure that person has no history of diseases nor engages in risky social behavior (examples are intravenous drug use or sexual activity with multiple partners). The donor's travel history is screened to minimize risk of transmitting infections, such as malaria. The donated blood is tested for signs of infectious diseases, such as HIV and hepatitis. The blood is then tested to be sure it is compatible with you in order to minimize the chance of a transfusion reaction. If you or a relative donates blood, this is often done in anticipation of surgery and is not appropriate for emergency situations. It takes many days to process the donated blood. RISKS AND COMPLICATIONS Although transfusion therapy is very safe and saves many lives, the main dangers of transfusion include:   Getting an infectious disease.  Developing a transfusion reaction. This is an allergic reaction to something in the blood you were given. Every precaution is taken to prevent this. The decision to have a blood transfusion has been considered carefully by your caregiver before blood is given. Blood is not given unless the benefits outweigh the risks. AFTER THE TRANSFUSION  Right after receiving a blood transfusion, you will usually feel much better and more energetic. This is especially true if your red  blood cells have gotten low (anemic). The transfusion raises the level of the red blood cells which carry oxygen, and this usually causes an energy increase.  The nurse administering the transfusion will monitor you carefully for complications. HOME CARE INSTRUCTIONS  No special instructions are needed after a transfusion. You may find your energy is better. Speak with your caregiver about any limitations on activity for underlying diseases you may have. SEEK MEDICAL CARE IF:   Your condition is not improving after your transfusion.  You develop redness or irritation at the intravenous (IV) site. SEEK IMMEDIATE MEDICAL CARE IF:  Any of the following symptoms occur over the next 12 hours:  Shaking chills.  You have a temperature by mouth above 102 F (38.9 C), not controlled by medicine.  Chest, back, or muscle pain.  People around you feel you are not acting correctly or are confused.  Shortness of breath or difficulty breathing.  Dizziness and fainting.  You get a rash or develop hives.  You have a decrease in urine output.  Your urine turns a dark color or changes to pink, red, or brown. Any of the following symptoms occur over the next 10 days:  You have a temperature by mouth above 102 F (38.9 C), not controlled by medicine.  Shortness of breath.  Weakness after normal activity.  The white part of the eye turns yellow (jaundice).  You have a decrease in the amount of urine or are urinating less often.  Your urine turns a dark color or changes to pink, red, or brown. Document Released: 02/16/2000 Document Revised: 05/13/2011 Document Reviewed: 10/05/2007 [facility] Patient Information 2014 [place], [place].  _______________________________________________________________________

## 2018-05-06 ENCOUNTER — Other Ambulatory Visit: Payer: Medicare HMO

## 2018-05-07 ENCOUNTER — Other Ambulatory Visit: Payer: Self-pay

## 2018-05-07 ENCOUNTER — Encounter (HOSPITAL_COMMUNITY): Payer: Self-pay

## 2018-05-07 ENCOUNTER — Encounter (HOSPITAL_COMMUNITY)
Admission: RE | Admit: 2018-05-07 | Discharge: 2018-05-07 | Disposition: A | Discharge status: Home / Self Care | Payer: Medicare HMO | Source: Ambulatory Visit | Attending: Orthopedic Surgery | Admitting: Orthopedic Surgery

## 2018-05-07 DIAGNOSIS — Z01812 Encounter for preprocedural laboratory examination: Secondary | ICD-10-CM | POA: Diagnosis not present

## 2018-05-07 LAB — BASIC METABOLIC PANEL
Anion gap: 7 (ref 5–15)
BUN: 16 mg/dL (ref 8–23)
CO2: 29 mmol/L (ref 22–32)
Calcium: 9.7 mg/dL (ref 8.9–10.3)
Chloride: 101 mmol/L (ref 98–111)
Creatinine, Ser: 0.9 mg/dL (ref 0.44–1.00)
GFR calc Af Amer: >60 mL/min (ref >60)
Glucose, Bld: 124 mg/dL — ABNORMAL HIGH (ref 70–99)
Potassium: 4.9 mmol/L (ref 3.5–5.1)
Sodium: 137 mmol/L (ref 135–145)

## 2018-05-07 LAB — ABO/RH: ABO/RH(D): O POS

## 2018-05-07 LAB — CBC
HCT: 38.3 % (ref 36.0–46.0)
Hemoglobin: 12.1 g/dL (ref 12.0–15.0)
MCH: 30.6 pg (ref 26.0–34.0)
MCHC: 31.6 g/dL (ref 30.0–36.0)
MCV: 97 fL (ref 80.0–100.0)
NRBC: 0 % (ref 0.0–0.2)
Platelets: 323 10*3/uL (ref 150–400)
RBC: 3.95 MIL/uL (ref 3.87–5.11)
RDW: 12.5 % (ref 11.5–15.5)
WBC: 7.5 10*3/uL (ref 4.0–10.5)

## 2018-05-07 LAB — GLUCOSE, CAPILLARY: GLUCOSE-CAPILLARY: 111 mg/dL — AB (ref 70–99)

## 2018-05-07 LAB — HEMOGLOBIN A1C
Hgb A1c MFr Bld: 6.3 % — ABNORMAL HIGH (ref 4.8–5.6)
MEAN PLASMA GLUCOSE: 134.11 mg/dL

## 2018-05-07 LAB — SURGICAL PCR SCREEN
MRSA, PCR: NEGATIVE
Staphylococcus aureus: NEGATIVE

## 2018-05-07 NOTE — Progress Notes (Signed)
PCP: Algie Coffer  CARDIOLOGIST: Dr. Rayann Heman  INFO IN Epic:  05-01-18 (Epic) LOV w/Pulmonary  04-30-18 (Epic) ECHO  04-16-18 (Epic) LOV w/ Primary Care Provider  04-13-18 (Epic) Cardiac Clearance from Dr. Rayann Heman and EKG in chart.  INFO ON CHART: Surgical Clearance from Algie Coffer, PCP  BLOOD THINNERS AND LAST DOSES: Eliquis - Pt plan to hold 48 prior to procedure ____________________________________  PATIENT SYMPTOMS AT TIME OF PREOP:  Hx of HTN, A-Fib, Mitral Regurgitation, Sleep Apnea, DM

## 2018-05-07 NOTE — Progress Notes (Signed)
Anesthesia Chart Review   Case:  [id number] Date/Time:  05/12/18 1115   Procedure:  TOTAL HIP ARTHROPLASTY ANTERIOR APPROACH (Right ) - 70 mins   Anesthesia type:  Spinal   Pre-op diagnosis:  Right hip osteoarthritis   Location:  [facility] ROOM 10 / [facility]   Surgeon:  Paralee Cancel, MD      DISCUSSION: 73 yo never smoker with h/o hypothyroidism, DM II, hiatal hernia, anxiety, depression, moderate MR, HTN, a-fib, OSA w/CPAP, GERD, right hip OA scheduled for above surgery 05/12/18 with Dr. Paralee Cancel.   Clearance received from PCP, Dr. Camillia Herter, on 04/18/18 (on chart) which states pt is low risk for surgical procedure.   Pt last seen by electrophysiologist, Dr. Thompson Grayer, on 04/13/18.  Per his note, "Ok to proceed without additional CV workup.  She can hold eliquis 48 hours prior to the procedure and resume post operatively when able."  Pt can proceed with planned procedure barring acute status change.   VS: BP (!) 118/55 (BP Location: Left Arm)   Pulse 69   Temp 36.8 C (Oral)   Resp 18   Ht 5\' 5"  (1.651 m)   Wt 73.6 kg   LMP  (LMP Unknown)   SpO2 100%   BMI 27.01 kg/m   PROVIDERS: Jinny Sanders, MD is PCP   Merton Border, MD is Pulmonologist last seen 05/01/18  Thompson Grayer, MD with electrophysiology  LABS: Labs reviewed: Acceptable for surgery. (all labs ordered are listed, but only abnormal results are displayed)  Labs Reviewed  HEMOGLOBIN A1C - Abnormal; Notable for the following components:      Result Value   Hgb A1c MFr Bld 6.3 (*)    All other components within normal limits  BASIC METABOLIC PANEL - Abnormal; Notable for the following components:   Glucose, Bld 124 (*)    All other components within normal limits  GLUCOSE, CAPILLARY - Abnormal; Notable for the following components:   Glucose-Capillary 111 (*)    All other components within normal limits  SURGICAL PCR SCREEN  CBC  TYPE AND SCREEN  ABO/RH     IMAGES:   EKG: 04/13/2018 Rate 65  bpm Normal sinus rhythm with sinus arrhythmia Normal ECG   CV: Echo 04/30/2018 IMPRESSIONS    1. The left ventricle has normal systolic function with an ejection fraction of 60-65%. The cavity size was normal. Left ventricular diastolic Doppler parameters are consistent with pseudonormalization.  2. The right ventricle has normal systolic function. The cavity was normal. There is no increase in right ventricular wall thickness. Right ventricular systolic pressure is mildly elevated with an estimated pressure of 31.3 mmHg.  3. Left atrial size was moderate to severely dilated.  4. Mitral valve regurgitation is moderate  5. Tricuspid valve regurgitation is moderate.  6. The aortic valve was not well visualized.  Stress Test 09/30/2013 The estimated ejection fraction is 62% The left ventricular global function was normal There are no EKG changes concerning for ischemia Overall this is a low risk scan.  Past Medical History:  Diagnosis Date  . Anemia   . Anxiety   . Colon polyps   . Constipation   . Depressive disorder, not elsewhere classified   . Diaphragmatic hernia without mention of obstruction or gangrene   . Disorders of bursae and tendons in shoulder region, unspecified   . Diverticulosis of colon (without mention of hemorrhage)   . External hemorrhoid   . Fundic gland polyps of stomach, benign   .  GERD (gastroesophageal reflux disease)   . History of hiatal hernia   . HTN (hypertension)   . Hypothyroidism   . Internal hemorrhoids without mention of complication   . Moderate mitral regurgitation   . Obesity, unspecified   . OSA on CPAP    compliant with CPAP  . Other urinary incontinence   . Persistent atrial fibrillation   . Pure hypercholesterolemia   . Type II diabetes mellitus ([place])   . Urinary frequency   . Urinary urgency   . Vitamin B 12 deficiency     Past Surgical History:  Procedure Laterality Date  . ABDOMINAL HYSTERECTOMY  1972   Have both  ovaries  . ATRIAL FIBRILLATION ABLATION  12/17/2016  . ATRIAL FIBRILLATION ABLATION N/A 12/17/2016   Procedure: ATRIAL FIBRILLATION ABLATION;  Surgeon: Thompson Grayer, MD;  Location: [place] [facility];  Service: Cardiovascular;  Laterality: N/A;  . [place]   [facility]; "Dr. [name]"  . CARDIOVERSION N/A 05/27/2016   Procedure: CARDIOVERSION;  Surgeon: [name], MD;  Location: [facility] ORS;  Service: Cardiovascular;  Laterality: N/A;  . CARDIOVERSION N/A 11/25/2016   Procedure: CARDIOVERSION;  Surgeon: [name], MD;  Location: [facility] ORS;  Service: Cardiovascular;  Laterality: N/A;  . CATARACT EXTRACTION W/ INTRAOCULAR LENS  IMPLANT, BILATERAL Bilateral   . COLONOSCOPY    . ELECTROPHYSIOLOGIC STUDY N/A 01/24/2016   Procedure: CARDIOVERSION;  Surgeon: [name], MD;  Location: [facility] ORS;  Service: Cardiovascular;  Laterality: N/A;  . LAPAROSCOPIC CHOLECYSTECTOMY    . REDUCTION MAMMAPLASTY Bilateral   . TONSILLECTOMY      MEDICATIONS: . atorvastatin (LIPITOR) 10 MG tablet  . Calcium Carb-Ergocalciferol 500-200 MG-UNIT TABS  . diltiazem (CARDIZEM CD) 120 MG 24 hr capsule  . ELIQUIS 5 MG TABS tablet  . escitalopram (LEXAPRO) 10 MG tablet  . ferrous sulfate 325 (65 FE) MG tablet  . fesoterodine (TOVIAZ) 4 MG TB24 tablet  . flecainide (TAMBOCOR) 50 MG tablet  . metFORMIN (GLUCOPHAGE-XR) 500 MG 24 hr tablet  . Multiple Vitamin (MULTIVITAMIN) tablet  . sennosides-docusate sodium (SENOKOT-S) 8.6-50 MG tablet  . TRADJENTA 5 MG TABS tablet  . traZODone (DESYREL) 50 MG tablet   . 0.9 %  sodium chloride infusion    Maia Plan [facility] Pre-Surgical Testing  [phone] 05/07/18 4:34 PM

## 2018-05-07 NOTE — Anesthesia Preprocedure Evaluation (Addendum)
Anesthesia Evaluation  Patient identified by MRN, date of birth, ID band Patient awake    Reviewed: Allergy & Precautions, NPO status , Patient's Chart, lab work & pertinent test results  Airway Mallampati: II  TM Distance: >3 FB Neck ROM: Full    Dental no notable dental hx. (+) Teeth Intact, Dental Advisory Given   Pulmonary sleep apnea and Continuous Positive Airway Pressure Ventilation ,    Pulmonary exam normal breath sounds clear to auscultation       Cardiovascular hypertension, Normal cardiovascular exam+ dysrhythmias (s/p ablation 2018) Atrial Fibrillation + Valvular Problems/Murmurs MR  Rhythm:Regular Rate:Normal  TTE 04/2018  1. The left ventricle has normal systolic function with an ejection fraction of 60-65%. The cavity size was normal. Left ventricular diastolic Doppler parameters are consistent with pseudonormalization.  2. The right ventricle has normal systolic function. The cavity was normal. There is no increase in right ventricular wall thickness. Right ventricular systolic pressure is mildly elevated with an estimated pressure of 31.3 mmHg.  3. Left atrial size was moderate to severely dilated.  4. Mitral valve regurgitation is moderate  5. Tricuspid valve regurgitation is moderate.  6. The aortic valve was not well visualized.  Stress Test 2015 normal   Neuro/Psych PSYCHIATRIC DISORDERS Anxiety Depression negative neurological ROS     GI/Hepatic Neg liver ROS, hiatal hernia, GERD  ,  Endo/Other  negative endocrine ROSdiabetes, Type 2, Oral Hypoglycemic Agents  Renal/GU negative Renal ROS  negative genitourinary   Musculoskeletal  (+) Arthritis ,   Abdominal   Peds  Hematology negative hematology ROS (+)   Anesthesia Other Findings On eliquis, last dose Saturday morning   Reproductive/Obstetrics negative OB ROS                           Anesthesia Physical Anesthesia  Plan  ASA: III  Anesthesia Plan: Spinal   Post-op Pain Management:    Induction: Intravenous  PONV Risk Score and Plan: 2 and Dexamethasone, Ondansetron, Treatment may vary due to age or medical condition and Midazolam  Airway Management Planned: Natural Airway and Simple Face Mask  Additional Equipment:   Intra-op Plan:   Post-operative Plan:   Informed Consent: I have reviewed the patients History and Physical, chart, labs and discussed the procedure including the risks, benefits and alternatives for the proposed anesthesia with the patient or authorized representative who has indicated his/her understanding and acceptance.     Dental advisory given  Plan Discussed with: CRNA  Anesthesia Plan Comments:       Anesthesia Quick Evaluation

## 2018-05-12 ENCOUNTER — Inpatient Hospital Stay (HOSPITAL_COMMUNITY): Payer: Medicare HMO

## 2018-05-12 ENCOUNTER — Encounter (HOSPITAL_COMMUNITY)
Admission: RE | Disposition: A | Discharge status: Home / Self Care | Payer: Self-pay | Source: Home / Self Care | Attending: Orthopedic Surgery

## 2018-05-12 ENCOUNTER — Encounter (HOSPITAL_COMMUNITY): Payer: Self-pay | Admitting: *Deleted

## 2018-05-12 ENCOUNTER — Inpatient Hospital Stay (HOSPITAL_COMMUNITY)
Admission: RE | Admit: 2018-05-12 | Discharge: 2018-05-13 | DRG: 470 | Disposition: A | Discharge status: Home / Self Care | Payer: Medicare HMO | Attending: Orthopedic Surgery | Admitting: Orthopedic Surgery

## 2018-05-12 ENCOUNTER — Other Ambulatory Visit: Payer: Self-pay

## 2018-05-12 ENCOUNTER — Inpatient Hospital Stay (HOSPITAL_COMMUNITY): Payer: Medicare HMO | Admitting: Certified Registered Nurse Anesthetist

## 2018-05-12 ENCOUNTER — Inpatient Hospital Stay (HOSPITAL_COMMUNITY): Payer: Medicare HMO | Admitting: Physician Assistant

## 2018-05-12 DIAGNOSIS — G4733 Obstructive sleep apnea (adult) (pediatric): Secondary | ICD-10-CM | POA: Diagnosis present

## 2018-05-12 DIAGNOSIS — Z833 Family history of diabetes mellitus: Secondary | ICD-10-CM

## 2018-05-12 DIAGNOSIS — E039 Hypothyroidism, unspecified: Secondary | ICD-10-CM | POA: Diagnosis present

## 2018-05-12 DIAGNOSIS — Z8249 Family history of ischemic heart disease and other diseases of the circulatory system: Secondary | ICD-10-CM | POA: Diagnosis not present

## 2018-05-12 DIAGNOSIS — Z96649 Presence of unspecified artificial hip joint: Secondary | ICD-10-CM

## 2018-05-12 DIAGNOSIS — I4819 Other persistent atrial fibrillation: Secondary | ICD-10-CM | POA: Diagnosis present

## 2018-05-12 DIAGNOSIS — D509 Iron deficiency anemia, unspecified: Secondary | ICD-10-CM | POA: Diagnosis present

## 2018-05-12 DIAGNOSIS — E538 Deficiency of other specified B group vitamins: Secondary | ICD-10-CM | POA: Diagnosis present

## 2018-05-12 DIAGNOSIS — K5730 Diverticulosis of large intestine without perforation or abscess without bleeding: Secondary | ICD-10-CM | POA: Diagnosis present

## 2018-05-12 DIAGNOSIS — Z823 Family history of stroke: Secondary | ICD-10-CM | POA: Diagnosis not present

## 2018-05-12 DIAGNOSIS — Z6830 Body mass index (BMI) 30.0-30.9, adult: Secondary | ICD-10-CM

## 2018-05-12 DIAGNOSIS — M1611 Unilateral primary osteoarthritis, right hip: Secondary | ICD-10-CM | POA: Diagnosis not present

## 2018-05-12 DIAGNOSIS — K449 Diaphragmatic hernia without obstruction or gangrene: Secondary | ICD-10-CM | POA: Diagnosis present

## 2018-05-12 DIAGNOSIS — E7800 Pure hypercholesterolemia, unspecified: Secondary | ICD-10-CM | POA: Diagnosis not present

## 2018-05-12 DIAGNOSIS — Z79899 Other long term (current) drug therapy: Secondary | ICD-10-CM | POA: Diagnosis not present

## 2018-05-12 DIAGNOSIS — F419 Anxiety disorder, unspecified: Secondary | ICD-10-CM | POA: Diagnosis present

## 2018-05-12 DIAGNOSIS — E669 Obesity, unspecified: Secondary | ICD-10-CM | POA: Diagnosis not present

## 2018-05-12 DIAGNOSIS — Z7984 Long term (current) use of oral hypoglycemic drugs: Secondary | ICD-10-CM | POA: Diagnosis not present

## 2018-05-12 DIAGNOSIS — Z96641 Presence of right artificial hip joint: Secondary | ICD-10-CM | POA: Diagnosis not present

## 2018-05-12 DIAGNOSIS — K219 Gastro-esophageal reflux disease without esophagitis: Secondary | ICD-10-CM | POA: Diagnosis present

## 2018-05-12 DIAGNOSIS — Z8349 Family history of other endocrine, nutritional and metabolic diseases: Secondary | ICD-10-CM | POA: Diagnosis not present

## 2018-05-12 DIAGNOSIS — Z801 Family history of malignant neoplasm of trachea, bronchus and lung: Secondary | ICD-10-CM

## 2018-05-12 DIAGNOSIS — E119 Type 2 diabetes mellitus without complications: Secondary | ICD-10-CM | POA: Diagnosis not present

## 2018-05-12 DIAGNOSIS — Z8041 Family history of malignant neoplasm of ovary: Secondary | ICD-10-CM | POA: Diagnosis not present

## 2018-05-12 DIAGNOSIS — I10 Essential (primary) hypertension: Secondary | ICD-10-CM | POA: Diagnosis present

## 2018-05-12 DIAGNOSIS — Z419 Encounter for procedure for purposes other than remedying health state, unspecified: Secondary | ICD-10-CM

## 2018-05-12 DIAGNOSIS — R69 Illness, unspecified: Secondary | ICD-10-CM | POA: Diagnosis not present

## 2018-05-12 DIAGNOSIS — Z471 Aftercare following joint replacement surgery: Secondary | ICD-10-CM | POA: Diagnosis not present

## 2018-05-12 HISTORY — PX: TOTAL HIP ARTHROPLASTY: SHX124

## 2018-05-12 LAB — TYPE AND SCREEN
ABO/RH(D): O POS
Antibody Screen: NEGATIVE

## 2018-05-12 LAB — GLUCOSE, CAPILLARY
GLUCOSE-CAPILLARY: 125 mg/dL — AB (ref 70–99)
Glucose-Capillary: 104 mg/dL — ABNORMAL HIGH (ref 70–99)

## 2018-05-12 SURGERY — ARTHROPLASTY, HIP, TOTAL, ANTERIOR APPROACH
Anesthesia: Spinal | Laterality: Right

## 2018-05-12 MED ORDER — PHENOL 1.4 % MT LIQD
1.0000 | OROMUCOSAL | Status: DC | PRN
  Filled 2018-05-12: qty 177

## 2018-05-12 MED ORDER — DEXAMETHASONE SODIUM PHOSPHATE 10 MG/ML IJ SOLN: 10.0000 mg | Freq: Once | INTRAMUSCULAR | Status: DC

## 2018-05-12 MED ORDER — ONDANSETRON HCL 4 MG/2ML IJ SOLN
INTRAMUSCULAR | Status: DC | PRN
  Administered 2018-05-12: 4 mg via INTRAVENOUS

## 2018-05-12 MED ORDER — LACTATED RINGERS IV SOLN
INTRAVENOUS | Status: DC
  Administered 2018-05-12 (×2): via INTRAVENOUS

## 2018-05-12 MED ORDER — METOCLOPRAMIDE HCL 5 MG PO TABS: 5.0000 mg | ORAL_TABLET | Freq: Three times a day (TID) | ORAL | Status: DC | PRN

## 2018-05-12 MED ORDER — ONDANSETRON HCL 4 MG/2ML IJ SOLN: 4.0000 mg | Freq: Four times a day (QID) | INTRAMUSCULAR | Status: DC | PRN

## 2018-05-12 MED ORDER — TRANEXAMIC ACID-NACL 1000-0.7 MG/100ML-% IV SOLN
1000.0000 mg | Freq: Once | INTRAVENOUS | Status: AC
  Administered 2018-05-12: 1000 mg via INTRAVENOUS
  Filled 2018-05-12: qty 100

## 2018-05-12 MED ORDER — METHOCARBAMOL 500 MG PO TABS
500.0000 mg | ORAL_TABLET | Freq: Four times a day (QID) | ORAL | Status: DC | PRN
  Administered 2018-05-12: 500 mg via ORAL
  Filled 2018-05-12: qty 1

## 2018-05-12 MED ORDER — PROPOFOL 10 MG/ML IV BOLUS
INTRAVENOUS | Status: DC | PRN
  Administered 2018-05-12: 40 mg via INTRAVENOUS
  Administered 2018-05-12: 10 mg via INTRAVENOUS

## 2018-05-12 MED ORDER — HYDROCODONE-ACETAMINOPHEN 5-325 MG PO TABS
1.0000 | ORAL_TABLET | ORAL | Status: DC | PRN
  Administered 2018-05-12 (×2): 1 via ORAL
  Filled 2018-05-12 (×2): qty 1

## 2018-05-12 MED ORDER — MIDAZOLAM HCL 5 MG/5ML IJ SOLN
INTRAMUSCULAR | Status: DC | PRN
  Administered 2018-05-12: 2 mg via INTRAVENOUS

## 2018-05-12 MED ORDER — BISACODYL 10 MG RE SUPP: 10.0000 mg | Freq: Every day | RECTAL | Status: DC | PRN

## 2018-05-12 MED ORDER — MORPHINE SULFATE (PF) 2 MG/ML IV SOLN: 0.5000 mg | INTRAVENOUS | Status: DC | PRN

## 2018-05-12 MED ORDER — LIDOCAINE 2% (20 MG/ML) 5 ML SYRINGE
INTRAMUSCULAR | Status: AC
  Filled 2018-05-12: qty 5

## 2018-05-12 MED ORDER — MENTHOL 3 MG MT LOZG: 1.0000 | LOZENGE | OROMUCOSAL | Status: DC | PRN

## 2018-05-12 MED ORDER — BUPIVACAINE IN DEXTROSE 0.75-8.25 % IT SOLN
INTRATHECAL | Status: DC | PRN
  Administered 2018-05-12: 1.8 mL via INTRATHECAL

## 2018-05-12 MED ORDER — EPHEDRINE SULFATE-NACL 50-0.9 MG/10ML-% IV SOSY
PREFILLED_SYRINGE | INTRAVENOUS | Status: DC | PRN
  Administered 2018-05-12: 10 mg via INTRAVENOUS

## 2018-05-12 MED ORDER — EPHEDRINE 5 MG/ML INJ
INTRAVENOUS | Status: AC
  Filled 2018-05-12: qty 10

## 2018-05-12 MED ORDER — ALUM & MAG HYDROXIDE-SIMETH 200-200-20 MG/5ML PO SUSP: 15.0000 mL | ORAL | Status: DC | PRN

## 2018-05-12 MED ORDER — METOCLOPRAMIDE HCL 5 MG/ML IJ SOLN: 5.0000 mg | Freq: Three times a day (TID) | INTRAMUSCULAR | Status: DC | PRN

## 2018-05-12 MED ORDER — SODIUM CHLORIDE 0.9 % IV SOLN
INTRAVENOUS | Status: DC
  Administered 2018-05-12: 15:00:00 via INTRAVENOUS

## 2018-05-12 MED ORDER — PROPOFOL 10 MG/ML IV BOLUS
INTRAVENOUS | Status: AC
  Filled 2018-05-12: qty 20

## 2018-05-12 MED ORDER — DIPHENHYDRAMINE HCL 12.5 MG/5ML PO ELIX: 12.5000 mg | ORAL_SOLUTION | ORAL | Status: DC | PRN

## 2018-05-12 MED ORDER — CEFAZOLIN SODIUM-DEXTROSE 2-4 GM/100ML-% IV SOLN
2.0000 g | INTRAVENOUS | Status: AC
  Administered 2018-05-12: 2 g via INTRAVENOUS
  Filled 2018-05-12: qty 100

## 2018-05-12 MED ORDER — PHENYLEPHRINE 40 MCG/ML (10ML) SYRINGE FOR IV PUSH (FOR BLOOD PRESSURE SUPPORT)
PREFILLED_SYRINGE | INTRAVENOUS | Status: DC | PRN
  Administered 2018-05-12 (×9): 80 ug via INTRAVENOUS

## 2018-05-12 MED ORDER — FENTANYL CITRATE (PF) 100 MCG/2ML IJ SOLN
INTRAMUSCULAR | Status: AC
  Filled 2018-05-12: qty 2

## 2018-05-12 MED ORDER — FENTANYL CITRATE (PF) 100 MCG/2ML IJ SOLN: 25.0000 ug | INTRAMUSCULAR | Status: DC | PRN

## 2018-05-12 MED ORDER — APIXABAN 2.5 MG PO TABS
2.5000 mg | ORAL_TABLET | Freq: Two times a day (BID) | ORAL | Status: DC
  Administered 2018-05-13: 2.5 mg via ORAL
  Filled 2018-05-12: qty 1

## 2018-05-12 MED ORDER — DOCUSATE SODIUM 100 MG PO CAPS
100.0000 mg | ORAL_CAPSULE | Freq: Two times a day (BID) | ORAL | Status: DC
  Administered 2018-05-12 – 2018-05-13 (×2): 100 mg via ORAL
  Filled 2018-05-12 (×2): qty 1

## 2018-05-12 MED ORDER — METHOCARBAMOL 500 MG IVPB - SIMPLE MED
INTRAVENOUS | Status: AC
  Administered 2018-05-12: 500 mg via INTRAVENOUS
  Filled 2018-05-12: qty 50

## 2018-05-12 MED ORDER — HYDROCODONE-ACETAMINOPHEN 7.5-325 MG PO TABS
1.0000 | ORAL_TABLET | ORAL | Status: DC | PRN
  Administered 2018-05-13 (×2): 1 via ORAL
  Filled 2018-05-12 (×2): qty 1

## 2018-05-12 MED ORDER — MIDAZOLAM HCL 2 MG/2ML IJ SOLN
INTRAMUSCULAR | Status: AC
  Filled 2018-05-12: qty 2

## 2018-05-12 MED ORDER — PROPOFOL 10 MG/ML IV BOLUS
INTRAVENOUS | Status: AC
  Filled 2018-05-12: qty 80

## 2018-05-12 MED ORDER — PHENYLEPHRINE 40 MCG/ML (10ML) SYRINGE FOR IV PUSH (FOR BLOOD PRESSURE SUPPORT)
PREFILLED_SYRINGE | INTRAVENOUS | Status: AC
  Filled 2018-05-12: qty 10

## 2018-05-12 MED ORDER — ONDANSETRON HCL 4 MG/2ML IJ SOLN
INTRAMUSCULAR | Status: AC
  Filled 2018-05-12: qty 2

## 2018-05-12 MED ORDER — FERROUS SULFATE 325 (65 FE) MG PO TABS
325.0000 mg | ORAL_TABLET | Freq: Three times a day (TID) | ORAL | Status: DC
  Administered 2018-05-13: 325 mg via ORAL
  Filled 2018-05-12: qty 1

## 2018-05-12 MED ORDER — POLYETHYLENE GLYCOL 3350 17 G PO PACK
17.0000 g | PACK | Freq: Two times a day (BID) | ORAL | Status: DC
  Administered 2018-05-12 – 2018-05-13 (×2): 17 g via ORAL
  Filled 2018-05-12 (×2): qty 1

## 2018-05-12 MED ORDER — SODIUM CHLORIDE 0.9 % IR SOLN
Status: DC | PRN
  Administered 2018-05-12: 1000 mL

## 2018-05-12 MED ORDER — DEXAMETHASONE SODIUM PHOSPHATE 10 MG/ML IJ SOLN
10.0000 mg | Freq: Once | INTRAMUSCULAR | Status: AC
  Administered 2018-05-12: 10 mg via INTRAVENOUS

## 2018-05-12 MED ORDER — FENTANYL CITRATE (PF) 100 MCG/2ML IJ SOLN
INTRAMUSCULAR | Status: DC | PRN
  Administered 2018-05-12: 50 ug via INTRAVENOUS
  Administered 2018-05-12 (×2): 25 ug via INTRAVENOUS

## 2018-05-12 MED ORDER — CHLORHEXIDINE GLUCONATE 4 % EX LIQD: 60.0000 mL | Freq: Once | CUTANEOUS | Status: DC

## 2018-05-12 MED ORDER — STERILE WATER FOR IRRIGATION IR SOLN
Status: DC | PRN
  Administered 2018-05-12: 2000 mL

## 2018-05-12 MED ORDER — MAGNESIUM CITRATE PO SOLN: 1.0000 | Freq: Once | ORAL | Status: DC | PRN

## 2018-05-12 MED ORDER — ONDANSETRON HCL 4 MG PO TABS: 4.0000 mg | ORAL_TABLET | Freq: Four times a day (QID) | ORAL | Status: DC | PRN

## 2018-05-12 MED ORDER — CEFAZOLIN SODIUM-DEXTROSE 2-4 GM/100ML-% IV SOLN
2.0000 g | Freq: Four times a day (QID) | INTRAVENOUS | Status: AC
  Administered 2018-05-12 (×2): 2 g via INTRAVENOUS
  Filled 2018-05-12 (×2): qty 100

## 2018-05-12 MED ORDER — DEXAMETHASONE SODIUM PHOSPHATE 10 MG/ML IJ SOLN
INTRAMUSCULAR | Status: AC
  Filled 2018-05-12: qty 1

## 2018-05-12 MED ORDER — METHOCARBAMOL 500 MG IVPB - SIMPLE MED
500.0000 mg | Freq: Four times a day (QID) | INTRAVENOUS | Status: DC | PRN
  Administered 2018-05-12: 500 mg via INTRAVENOUS
  Filled 2018-05-12: qty 50

## 2018-05-12 MED ORDER — TRANEXAMIC ACID-NACL 1000-0.7 MG/100ML-% IV SOLN
1000.0000 mg | INTRAVENOUS | Status: AC
  Administered 2018-05-12: 1000 mg via INTRAVENOUS
  Filled 2018-05-12: qty 100

## 2018-05-12 MED ORDER — ACETAMINOPHEN 500 MG PO TABS
1000.0000 mg | ORAL_TABLET | Freq: Once | ORAL | Status: AC
  Administered 2018-05-12: 1000 mg via ORAL
  Filled 2018-05-12: qty 2

## 2018-05-12 MED ORDER — ACETAMINOPHEN 325 MG PO TABS: 325.0000 mg | ORAL_TABLET | Freq: Four times a day (QID) | ORAL | Status: DC | PRN

## 2018-05-12 MED ORDER — PROPOFOL 500 MG/50ML IV EMUL
INTRAVENOUS | Status: DC | PRN
  Administered 2018-05-12: 50 ug/kg/min via INTRAVENOUS

## 2018-05-12 SURGICAL SUPPLY — 47 items
BAG DECANTER FOR FLEXI CONT (MISCELLANEOUS) IMPLANT
BAG ZIPLOCK 12X15 (MISCELLANEOUS) IMPLANT
BALL HIP CERAMIC (Hips) ×1 IMPLANT
BLADE SAG 18X100X1.27 (BLADE) ×2 IMPLANT
BLADE SURG SZ10 CARB STEEL (BLADE) ×4 IMPLANT
CHLORAPREP W/TINT 26 (MISCELLANEOUS) ×2 IMPLANT
COVER PERINEAL POST (MISCELLANEOUS) ×2 IMPLANT
COVER SURGICAL LIGHT HANDLE (MISCELLANEOUS) ×2 IMPLANT
COVER WAND RF STERILE (DRAPES) IMPLANT
CUP ACET PINNACLE SECTR 50MM (Hips) ×1 IMPLANT
DERMABOND ADVANCED (GAUZE/BANDAGES/DRESSINGS) ×1
DERMABOND ADVANCED .7 DNX12 (GAUZE/BANDAGES/DRESSINGS) ×1 IMPLANT
DRAPE STERI IOBAN 125X83 (DRAPES) ×2 IMPLANT
DRAPE U-SHAPE 47X51 STRL (DRAPES) ×4 IMPLANT
DRESSING AQUACEL AG SP 3.5X10 (GAUZE/BANDAGES/DRESSINGS) ×1 IMPLANT
DRSG AQUACEL AG ADV 3.5X10 (GAUZE/BANDAGES/DRESSINGS) ×2 IMPLANT
DRSG AQUACEL AG SP 3.5X10 (GAUZE/BANDAGES/DRESSINGS) ×2
ELECT REM PT RETURN 15FT ADLT (MISCELLANEOUS) ×2 IMPLANT
ELIMINATOR HOLE APEX DEPUY (Hips) ×2 IMPLANT
GLOVE BIOGEL PI IND STRL 7.5 (GLOVE) ×1 IMPLANT
GLOVE BIOGEL PI IND STRL 8.5 (GLOVE) ×1 IMPLANT
GLOVE BIOGEL PI INDICATOR 7.5 (GLOVE) ×1
GLOVE BIOGEL PI INDICATOR 8.5 (GLOVE) ×1
GLOVE ECLIPSE 8.0 STRL XLNG CF (GLOVE) ×4 IMPLANT
GLOVE ORTHO TXT STRL SZ7.5 (GLOVE) ×2 IMPLANT
GOWN STRL REUS W/TWL 2XL LVL3 (GOWN DISPOSABLE) ×2 IMPLANT
GOWN STRL REUS W/TWL LRG LVL3 (GOWN DISPOSABLE) ×2 IMPLANT
HIP BALL CERAMIC (Hips) ×2 IMPLANT
HOLDER FOLEY CATH W/STRAP (MISCELLANEOUS) ×2 IMPLANT
KIT TURNOVER KIT A (KITS) IMPLANT
LINER ACET PNNCL PLUS4 NEUTRAL (Hips) ×1 IMPLANT
PACK ANTERIOR HIP CUSTOM (KITS) ×2 IMPLANT
PINNACLE PLUS 4 NEUTRAL (Hips) ×2 IMPLANT
PINNACLE SECTOR CUP 50MM (Hips) ×2 IMPLANT
SCREW 6.5MMX30MM (Screw) ×2 IMPLANT
STEM TRI LOC BPS SZ6 W GRIPTON ×1 IMPLANT
SUT MNCRL AB 4-0 PS2 18 (SUTURE) ×4 IMPLANT
SUT STRATAFIX 0 PDS 27 VIOLET (SUTURE) ×2
SUT VIC AB 1 CT1 36 (SUTURE) ×6 IMPLANT
SUT VIC AB 2-0 CT1 27 (SUTURE) ×2
SUT VIC AB 2-0 CT1 TAPERPNT 27 (SUTURE) ×2 IMPLANT
SUTURE STRATFX 0 PDS 27 VIOLET (SUTURE) ×1 IMPLANT
TRAY FOLEY MTR SLVR 14FR STAT (SET/KITS/TRAYS/PACK) ×2 IMPLANT
TRAY FOLEY MTR SLVR 16FR STAT (SET/KITS/TRAYS/PACK) IMPLANT
TRI LOC BPS SZ 6 W GRIPTON ×2 IMPLANT
WATER STERILE IRR 1000ML POUR (IV SOLUTION) ×2 IMPLANT
YANKAUER SUCT BULB TIP 10FT TU (MISCELLANEOUS) IMPLANT

## 2018-05-12 NOTE — Anesthesia Procedure Notes (Signed)
Spinal  Patient location during procedure: OR Start time: 05/12/2018 11:02 AM End time: 05/12/2018 11:12 AM Staffing Anesthesiologist: Freddrick March, MD Performed: anesthesiologist  Preanesthetic Checklist Completed: patient identified, surgical consent, pre-op evaluation, timeout performed, IV checked, risks and benefits discussed and monitors and equipment checked Spinal Block Patient position: sitting Prep: site prepped and draped and DuraPrep Patient monitoring: cardiac monitor, continuous pulse ox and blood pressure Approach: midline Location: L3-4 Injection technique: single-shot Needle Needle type: Pencan  Needle gauge: 24 G Needle length: 9 cm Assessment Sensory level: T6 Additional Notes Functioning IV was confirmed and monitors were applied. Sterile prep and drape, including hand hygiene and sterile gloves were used. The patient was positioned and the spine was prepped. The skin was anesthetized with lidocaine.  Free flow of clear CSF was obtained prior to injecting local anesthetic into the CSF.  The spinal needle aspirated freely following injection.  The needle was carefully withdrawn.  The patient tolerated the procedure well.

## 2018-05-12 NOTE — Interval H&P Note (Signed)
History and Physical Interval Note:  05/12/2018 9:44 AM  [name]  has presented today for surgery, with the diagnosis of Right hip osteoarthritis.  The various methods of treatment have been discussed with the patient and family. After consideration of risks, benefits and other options for treatment, the patient has consented to  Procedure(s) with comments: TOTAL HIP ARTHROPLASTY ANTERIOR APPROACH (Right) - 70 mins as a surgical intervention.  The patient's history has been reviewed, patient examined, no change in status, stable for surgery.  I have reviewed the patient's chart and labs.  Questions were answered to the patient's satisfaction.     Mauri Pole

## 2018-05-12 NOTE — Transfer of Care (Signed)
Immediate Anesthesia Transfer of Care Note  Patient: [name]  Procedure(s) Performed: TOTAL HIP ARTHROPLASTY ANTERIOR APPROACH (Right )  Patient Location: PACU  Anesthesia Type:MAC and Spinal  Level of Consciousness: awake, alert , oriented and patient cooperative  Airway & Oxygen Therapy: Patient Spontanous Breathing and Patient connected to face mask oxygen  Post-op Assessment: Report given to RN and Post -op Vital signs reviewed and stable  Post vital signs: Reviewed and stable  Last Vitals:  Vitals Value Taken Time  BP    Temp    Pulse 68 05/12/2018  1:19 PM  Resp 9 05/12/2018  1:19 PM  SpO2 99 % 05/12/2018  1:19 PM  Vitals shown include unvalidated device data.  Last Pain:  Vitals:   05/12/18 0900  TempSrc: Oral         Complications: No apparent anesthesia complications

## 2018-05-12 NOTE — Anesthesia Procedure Notes (Signed)
Procedure Name: MAC Date/Time: 05/12/2018 11:00 AM Performed by: West Pugh, CRNA Pre-anesthesia Checklist: Patient identified, Emergency Drugs available, Suction available, Patient being monitored and Timeout performed Patient Re-evaluated:Patient Re-evaluated prior to induction Oxygen Delivery Method: Simple face mask Preoxygenation: Pre-oxygenation with 100% oxygen Induction Type: IV induction Placement Confirmation: CO2 detector Dental Injury: Teeth and Oropharynx as per pre-operative assessment

## 2018-05-12 NOTE — Anesthesia Postprocedure Evaluation (Signed)
Anesthesia Post Note  Patient: [name]  Procedure(s) Performed: TOTAL HIP ARTHROPLASTY ANTERIOR APPROACH (Right )     Patient location during evaluation: PACU Anesthesia Type: Spinal Level of consciousness: oriented and awake and alert Pain management: pain level controlled Vital Signs Assessment: post-procedure vital signs reviewed and stable Respiratory status: spontaneous breathing, respiratory function stable and patient connected to nasal cannula oxygen Cardiovascular status: blood pressure returned to baseline and stable Postop Assessment: no headache, no backache and no apparent nausea or vomiting Anesthetic complications: no    Last Vitals:  Vitals:   05/12/18 1634 05/12/18 1739  BP: (!) 147/66 (!) 143/63  Pulse: 69 68  Resp: 14 16  Temp: 36.6 C 36.7 C  SpO2: 99% 100%    Last Pain:  Vitals:   05/12/18 1620  TempSrc:   PainSc: 2                  [name]

## 2018-05-12 NOTE — Op Note (Signed)
NAME:  [name]                ACCOUNT NO.: [id number]      MEDICAL RECORD NO.: [id number]      FACILITY:  [facility]      PHYSICIAN:  Mauri Pole  DATE OF BIRTH:  January 26, 1946     DATE OF PROCEDURE:  05/12/2018                                 OPERATIVE REPORT         PREOPERATIVE DIAGNOSIS: Right  hip osteoarthritis.      POSTOPERATIVE DIAGNOSIS:  Right hip osteoarthritis.      PROCEDURE:  Right total hip replacement through an anterior approach   utilizing DePuy THR system, component size 62mm pinnacle cup, a size 32+4 neutral   Altrex liner, a size 6 Hi Tri Lock stem with a 32+5 delta ceramic   ball.      SURGEON:  Pietro Cassis. Alvan Dame, M.D.      ASSISTANT:  Nehemiah Massed, PA-C     ANESTHESIA:  Spinal.      SPECIMENS:  None.      COMPLICATIONS:  None.      BLOOD LOSS:  250 cc     DRAINS:  None.      INDICATION OF THE PROCEDURE:  [name] is a 73 y.o. female who had   presented to office for evaluation of right hip pain.  Radiographs revealed   progressive degenerative changes with bone-on-bone   articulation of the  hip joint, including subchondral cystic changes and osteophytes.  The patient had painful limited range of   motion significantly affecting their overall quality of life and function.  The patient was failing to    respond to conservative measures including medications and/or injections and activity modification and at this point was ready   to proceed with more definitive measures.  Consent was obtained for   benefit of pain relief.  Specific risks of infection, DVT, component   failure, dislocation, neurovascular injury, and need for revision surgery were reviewed in the office as well discussion of   the anterior versus posterior approach were reviewed.     PROCEDURE IN DETAIL:  The patient was brought to operative theater.   Once adequate anesthesia, preoperative antibiotics, 2 gm of Ancef, 1 gm of Tranexamic Acid, and 10  mg of Decadron were administered, the patient was positioned supine on the [employer] table.  Once the patient was safely positioned with adequate padding of boney prominences we predraped out the hip, and used fluoroscopy to confirm orientation of the pelvis.      The right hip was then prepped and draped from proximal iliac crest to   mid thigh with a shower curtain technique.      Time-out was performed identifying the patient, planned procedure, and the appropriate extremity.     An incision was then made 2 cm lateral to the   anterior superior iliac spine extending over the orientation of the   tensor fascia lata muscle and sharp dissection was carried down to the   fascia of the muscle.      The fascia was then incised.  The muscle belly was identified and swept   laterally and retractor placed along the superior neck.  Following   cauterization of the circumflex vessels and removing some  pericapsular   fat, a second cobra retractor was placed on the inferior neck.  A T-capsulotomy was made along the line of the   superior neck to the trochanteric fossa, then extended proximally and   distally.  Tag sutures were placed and the retractors were then placed   intracapsular.  We then identified the trochanteric fossa and   orientation of my neck cut and then made a neck osteotomy with the femur on traction.  The femoral   head was removed without difficulty or complication.  Traction was let   off and retractors were placed posterior and anterior around the   acetabulum.      The labrum and foveal tissue were debrided.  I began reaming with a 46 mm   reamer and reamed up to 49 mm reamer with good bony bed preparation and a 50 mm  cup was chosen.  The final 50 mm Pinnacle cup was then impacted under fluoroscopy to confirm the depth of penetration and orientation with respect to   Abduction and forward flexion.  A screw was placed into the ilium followed by the hole eliminator.  The final    32+4 neutral Altrex liner was impacted with good visualized rim fit.  The cup was positioned anatomically within the acetabular portion of the pelvis.      At this point, the femur was rolled to 100 degrees.  Further capsule was   released off the inferior aspect of the femoral neck.  I then   released the superior capsule proximally.  With the leg in a neutral position the hook was placed laterally   along the femur under the vastus lateralis origin and elevated manually and then held in position using the hook attachment on the bed.  The leg was then extended and adducted with the leg rolled to 100   degrees of external rotation.  Retractors were placed along the medial calcar and posteriorly over the greater trochanter.  Once the proximal femur was fully   exposed, I used a box osteotome to set orientation.  I then began   broaching with the starting chili pepper broach and passed this by hand and then broached up to 6.  With the 6 broach in place I chose a high offset neck and did several trial reductions.  The offset was appropriate, leg lengths   appeared to be equal best matched with the +5 head ball trial confirmed radiographically.   Given these findings, I went ahead and dislocated the hip, repositioned all   retractors and positioned the right hip in the extended and abducted position.  The final 6 Hi Tri Lock stem was   chosen and it was impacted down to the level of neck cut.  Based on this   and the trial reductions, a final 32+5 delta ceramic ball was chosen and   impacted onto a clean and dry trunnion, and the hip was reduced.  The   hip had been irrigated throughout the case again at this point.  I did   reapproximate the superior capsular leaflet to the anterior leaflet   using #1 Vicryl.  The fascia of the   tensor fascia lata muscle was then reapproximated using #1 Vicryl and #0 Stratafix sutures.  The   remaining wound was closed with 2-0 Vicryl and running 4-0 Monocryl.    The hip was cleaned, dried, and dressed sterilely using Dermabond and   Aquacel dressing.  The patient was then brought  to recovery room in stable condition tolerating the procedure well.    Nehemiah Massed, PA-C was present for the entirety of the case involved from   preoperative positioning, perioperative retractor management, general   facilitation of the case, as well as primary wound closure as assistant.            Pietro Cassis Alvan Dame, M.D.        05/12/2018 12:33 PM

## 2018-05-12 NOTE — Discharge Instructions (Signed)

## 2018-05-13 ENCOUNTER — Telehealth: Payer: Self-pay | Admitting: Internal Medicine

## 2018-05-13 LAB — BASIC METABOLIC PANEL
Anion gap: 8 (ref 5–15)
BUN: 13 mg/dL (ref 8–23)
CALCIUM: 8.7 mg/dL — AB (ref 8.9–10.3)
CO2: 25 mmol/L (ref 22–32)
Chloride: 102 mmol/L (ref 98–111)
Creatinine, Ser: 0.76 mg/dL (ref 0.44–1.00)
GFR calc Af Amer: >60 mL/min (ref >60)
GFR calc non Af Amer: >60 mL/min (ref >60)
Glucose, Bld: 241 mg/dL — ABNORMAL HIGH (ref 70–99)
Potassium: 4.9 mmol/L (ref 3.5–5.1)
Sodium: 135 mmol/L (ref 135–145)

## 2018-05-13 LAB — CBC
HCT: 32.8 % — ABNORMAL LOW (ref 36.0–46.0)
Hemoglobin: 10.5 g/dL — ABNORMAL LOW (ref 12.0–15.0)
MCH: 30.4 pg (ref 26.0–34.0)
MCHC: 32 g/dL (ref 30.0–36.0)
MCV: 95.1 fL (ref 80.0–100.0)
Platelets: 257 10*3/uL (ref 150–400)
RBC: 3.45 MIL/uL — ABNORMAL LOW (ref 3.87–5.11)
RDW: 12.1 % (ref 11.5–15.5)
WBC: 11.8 10*3/uL — ABNORMAL HIGH (ref 4.0–10.5)
nRBC: 0 % (ref 0.0–0.2)

## 2018-05-13 LAB — GLUCOSE, CAPILLARY: Glucose-Capillary: 163 mg/dL — ABNORMAL HIGH (ref 70–99)

## 2018-05-13 MED ORDER — METHOCARBAMOL 500 MG PO TABS: 500.0000 mg | ORAL_TABLET | Freq: Four times a day (QID) | ORAL | 0 refills | Status: DC | PRN

## 2018-05-13 MED ORDER — FERROUS SULFATE 325 (65 FE) MG PO TABS: 325.0000 mg | ORAL_TABLET | Freq: Three times a day (TID) | ORAL | 3 refills | Status: DC

## 2018-05-13 MED ORDER — HYDROCODONE-ACETAMINOPHEN 7.5-325 MG PO TABS: 1.0000 | ORAL_TABLET | ORAL | 0 refills | Status: DC | PRN

## 2018-05-13 NOTE — Discharge Summary (Signed)
Physician Discharge Summary   Patient ID: [name] MRN: [id number] DOB/AGE: 71-Jun-1947 71 y.o.  Admit date: 05/12/2018 Discharge date: 05/13/2018  Primary Diagnosis: Right hip osteoarthritis  Admission Diagnoses:  Past Medical History:  Diagnosis Date   Anemia    Anxiety    Colon polyps    Constipation    Depressive disorder, not elsewhere classified    Diaphragmatic hernia without mention of obstruction or gangrene    Disorders of bursae and tendons in shoulder region, unspecified    Diverticulosis of colon (without mention of hemorrhage)    External hemorrhoid    Fundic gland polyps of stomach, benign    GERD (gastroesophageal reflux disease)    History of hiatal hernia    HTN (hypertension)    Hypothyroidism    Internal hemorrhoids without mention of complication    Moderate mitral regurgitation    Obesity, unspecified    OSA on CPAP    compliant with CPAP   Other urinary incontinence    Persistent atrial fibrillation    Pure hypercholesterolemia    Type II diabetes mellitus ([place])    Urinary frequency    Urinary urgency    Vitamin B 12 deficiency    Discharge Diagnoses:   Principal Problem:   S/P right THA Active Problems:   Status post total hip replacement, right  Estimated body mass index is 26.78 kg/m as calculated from the following:   Height as of this encounter: 5\' 5"  (1.651 m).   Weight as of this encounter: 73 kg.  Procedure:  Procedure(s) (LRB): TOTAL HIP ARTHROPLASTY ANTERIOR APPROACH (Right)   Consults: None  HPI: [name] is a 73 y.o. female who had   presented to office for evaluation of right hip pain.  Radiographs revealed   progressive degenerative changes with bone-on-bone   articulation of the  hip joint, including subchondral cystic changes and osteophytes.  The patient had painful limited range of   motion significantly affecting their overall quality of life and function.  The patient was failing  to    respond to conservative measures including medications and/or injections and activity modification and at this point was ready   to proceed with more definitive measures.  Consent was obtained for   benefit of pain relief.  Specific risks of infection, DVT, component   failure, dislocation, neurovascular injury, and need for revision surgery were reviewed in the office as well discussion of   the anterior versus posterior approach were reviewed.  Laboratory Data: Admission on 05/12/2018, Discharged on 05/13/2018  Component Date Value Ref Range Status   Glucose-Capillary 05/12/2018 104* 70 - 99 mg/dL Final   Glucose-Capillary 05/12/2018 125* 70 - 99 mg/dL Final   WBC 05/13/2018 11.8* 4.0 - 10.5 K/uL Final   RBC 05/13/2018 3.45* 3.87 - 5.11 MIL/uL Final   Hemoglobin 05/13/2018 10.5* 12.0 - 15.0 g/dL Final   HCT 05/13/2018 32.8* 36.0 - 46.0 % Final   MCV 05/13/2018 95.1  80.0 - 100.0 fL Final   MCH 05/13/2018 30.4  26.0 - 34.0 pg Final   MCHC 05/13/2018 32.0  30.0 - 36.0 g/dL Final   RDW 05/13/2018 12.1  11.5 - 15.5 % Final   Platelets 05/13/2018 257  150 - 400 K/uL Final   nRBC 05/13/2018 0.0  0.0 - 0.2 % Final   Performed at [facility], [place] [address] [place], [place] [address]   Sodium 05/13/2018 135  135 - 145 mmol/L Final   Potassium 05/13/2018 4.9  3.5 - 5.1 mmol/L Final   Chloride 05/13/2018 102  98 - 111 mmol/L Final   CO2 05/13/2018 25  22 - 32 mmol/L Final   Glucose, Bld 05/13/2018 241* 70 - 99 mg/dL Final   BUN 05/13/2018 13  8 - 23 mg/dL Final   Creatinine, Ser 05/13/2018 0.76  0.44 - 1.00 mg/dL Final   Calcium 05/13/2018 8.7* 8.9 - 10.3 mg/dL Final   GFR calc non Af Amer 05/13/2018 >60  >60 mL/min Final   GFR calc Af Amer 05/13/2018 >60  >60 mL/min Final   Anion gap 05/13/2018 8  5 - 15 Final   Performed at [facility], [place] [address], [place], [place] [address]   Glucose-Capillary 05/13/2018 163* 70  - 99 mg/dL Final  Hospital Outpatient Visit on 05/07/2018  Component Date Value Ref Range Status   ABO/RH(D) 05/07/2018 O POS   Final   Antibody Screen 05/07/2018 NEG   Final   Sample Expiration 05/07/2018 05/15/2018   Final   Extend sample reason 05/07/2018    Final                   Value:NO TRANSFUSIONS OR PREGNANCY IN THE PAST 3 MONTHS Performed at [facility], [place] [address], [place], [place] [address]    MRSA, PCR 05/07/2018 NEGATIVE  NEGATIVE Final   Staphylococcus aureus 05/07/2018 NEGATIVE  NEGATIVE Final   Comment: (NOTE) The Xpert SA Assay (FDA approved for NASAL specimens in patients 60 years of age and older), is one component of a comprehensive surveillance program. It is not intended to diagnose infection nor to guide or monitor treatment. Performed at [facility], [place] [address], [place], [place] [address]    Hgb A1c MFr Bld 05/07/2018 6.3* 4.8 - 5.6 % Final   Comment: (NOTE) Pre diabetes:          5.7%-6.4% Diabetes:              >6.4% Glycemic control for   <7.0% adults with diabetes    Mean Plasma Glucose 05/07/2018 134.11  mg/dL Final   Performed at [facility], [place] [address], [place], [place] [address]   Sodium 05/07/2018 137  135 - 145 mmol/L Final   Potassium 05/07/2018 4.9  3.5 - 5.1 mmol/L Final   Chloride 05/07/2018 101  98 - 111 mmol/L Final   CO2 05/07/2018 29  22 - 32 mmol/L Final   Glucose, Bld 05/07/2018 124* 70 - 99 mg/dL Final   BUN 05/07/2018 16  8 - 23 mg/dL Final   Creatinine, Ser 05/07/2018 0.90  0.44 - 1.00 mg/dL Final   Calcium 05/07/2018 9.7  8.9 - 10.3 mg/dL Final   GFR calc non Af Amer 05/07/2018 >60  >60 mL/min Final   GFR calc Af Amer 05/07/2018 >60  >60 mL/min Final   Anion gap 05/07/2018 7  5 - 15 Final   Performed at [facility], [place] [address] [place], [place] [address]   WBC 05/07/2018 7.5  4.0 - 10.5 K/uL Final   RBC 05/07/2018 3.95   3.87 - 5.11 MIL/uL Final   Hemoglobin 05/07/2018 12.1  12.0 - 15.0 g/dL Final   HCT 05/07/2018 38.3  36.0 - 46.0 % Final   MCV 05/07/2018 97.0  80.0 - 100.0 fL Final   MCH 05/07/2018 30.6  26.0 - 34.0 pg Final   MCHC 05/07/2018 31.6  30.0 - 36.0 g/dL Final   RDW 05/07/2018 12.5  11.5 - 15.5 % Final   Platelets 05/07/2018 323  150 - 400 K/uL Final   nRBC 05/07/2018 0.0  0.0 - 0.2 % Final   Performed at [facility], [place] [address] [place], [place] [address]   Glucose-Capillary 05/07/2018 111* 70 - 99 mg/dL Final   ABO/RH(D) 05/07/2018    Final                   Value:O POS Performed at [facility], [place] [address] [place], [place] [address]   Lab on 04/27/2018  Component Date Value Ref Range Status   MICRO NUMBER: 04/27/2018 [id number]   Final   SPECIMEN QUALITY: 04/27/2018 Adequate   Final   Sample Source 04/27/2018 NOT GIVEN   Final   STATUS: 04/27/2018 FINAL   Final   ISOLATE 1: 04/27/2018 Multiple organisms present, each less than 10,000 CFU/mL. These organisms, commonly found on external and internal genitalia, are considered to be colonizers. No further testing performed.   Final  Office Visit on 04/16/2018  Component Date Value Ref Range Status   Hemoglobin A1C 04/16/2018 6.2* 4.0 - 5.6 % Final   WBC 04/16/2018 6.5  4.0 - 10.5 K/uL Final   RBC 04/16/2018 3.89  3.87 - 5.11 Mil/uL Final   Hemoglobin 04/16/2018 12.0  12.0 - 15.0 g/dL Final   HCT 04/16/2018 35.8* 36.0 - 46.0 % Final   MCV 04/16/2018 92.0  78.0 - 100.0 fl Final   MCHC 04/16/2018 33.6  30.0 - 36.0 g/dL Final   RDW 04/16/2018 13.0  11.5 - 15.5 % Final   Platelets 04/16/2018 324.0  150.0 - 400.0 K/uL Final   Neutrophils Relative % 04/16/2018 74.2  43.0 - 77.0 % Final   Lymphocytes Relative 04/16/2018 18.7  12.0 - 46.0 % Final   Monocytes Relative 04/16/2018 6.3  3.0 - 12.0 % Final   Eosinophils Relative 04/16/2018 0.6  0.0 - 5.0 % Final   Basophils  Relative 04/16/2018 0.2  0.0 - 3.0 % Final   Neutro Abs 04/16/2018 4.8  1.4 - 7.7 K/uL Final   Lymphs Abs 04/16/2018 1.2  0.7 - 4.0 K/uL Final   Monocytes Absolute 04/16/2018 0.4  0.1 - 1.0 K/uL Final   Eosinophils Absolute 04/16/2018 0.0  0.0 - 0.7 K/uL Final   Basophils Absolute 04/16/2018 0.0  0.0 - 0.1 K/uL Final   Albumin 04/16/2018 4.1  3.5 - 5.2 g/dL Final   Color, UA 04/16/2018 yellow   Final   Clarity, UA 04/16/2018 cloudy   Final   Glucose, UA 04/16/2018 Negative  Negative Final   Bilirubin, UA 04/16/2018 neg   Final   Ketones, UA 04/16/2018 neg   Final   Spec Grav, UA 04/16/2018 1.025  1.010 - 1.025 Final   Blood, UA 04/16/2018 2+   Final   pH, UA 04/16/2018 6.0  5.0 - 8.0 Final   Protein, UA 04/16/2018 Positive* Negative Final   Urobilinogen, UA 04/16/2018 0.2  0.2 or 1.0 E.U./dL Final   Nitrite, UA 04/16/2018 pos   Final   Leukocytes, UA 04/16/2018 Large (3+)* Negative Final   MICRO NUMBER: 04/16/2018 [id number]   Final   SPECIMEN QUALITY: 04/16/2018 Adequate   Final   Sample Source 04/16/2018 URINE   Final   STATUS: 04/16/2018 FINAL   Final   ISOLATE 1: 04/16/2018 Citrobacter koseri*  Final   Greater than 100,000 CFU/mL of Citrobacter koseri     X-Rays:Dg Pelvis Portable  Result Date: 05/12/2018 CLINICAL DATA:  Status post right hip replacement. EXAM: PORTABLE PELVIS 1-2 VIEWS COMPARISON:  None. FINDINGS: The patient is status post right hip replacement. Acetabular and femoral components are in good position. IMPRESSION: Status post right hip replacement. Hardware is in good position. Postoperative soft tissue air noted. Electronically Signed   By: Dorise Bullion III M.D   On: 05/12/2018 13:56   Dg C-arm 1-60 Min-no Report  Result Date: 05/12/2018 Fluoroscopy was utilized by the requesting physician.  No radiographic interpretation.   Dg Hip Operative Unilat W Or W/o Pelvis Right  Result Date: 05/12/2018 CLINICAL DATA:  Right total hip  arthroplasty FLUOROSCOPY TIME:  18 seconds. Images: 4 EXAM: OPERATIVE RIGHT HIP (WITH PELVIS IF PERFORMED) 4 VIEWS TECHNIQUE: Fluoroscopic spot image(s) were submitted for interpretation post-operatively. COMPARISON:  None. FINDINGS: By the end of the study, the patient is status post right hip arthroplasty. Hardware is in good position. IMPRESSION: Right hip arthroplasty as above. Electronically Signed   By: Dorise Bullion III M.D   On: 05/12/2018 13:25    EKG: Orders placed or performed in visit on 04/13/18   EKG 12-Lead     Hospital Course: [name] is a 73 y.o. who was admitted to [facility]. They were brought to the operating room on 05/12/2018 and underwent Procedure(s): Declo.  Patient tolerated the procedure well and was later transferred to the recovery room and then to the orthopaedic floor for postoperative care. They were given PO and IV analgesics for pain control following their surgery. They were given 24 hours of postoperative antibiotics of  Anti-infectives (From admission, onward)   Start     Dose/Rate Route Frequency Ordered Stop   05/12/18 1700  ceFAZolin (ANCEF) IVPB 2g/100 mL premix     2 g 200 mL/hr over 30 Minutes Intravenous Every 6 hours 05/12/18 1520 05/13/18 0007   05/12/18 0900  ceFAZolin (ANCEF) IVPB 2g/100 mL premix     2 g 200 mL/hr over 30 Minutes Intravenous On call to O.R. 05/12/18 7902 05/12/18 1143     and started on DVT prophylaxis in the form of Eliquis.   PT and OT were ordered for total joint protocol. Discharge planning consulted to help with postop disposition and equipment needs.  Patient had a good night on the evening of surgery. They started to get up OOB with therapy on POD #1. Pt was seen during rounds and was ready to go home pending progress with therapy. She worked with therapy on POD #1 and was meeting her goals. Pt was discharged to home later that day in stable condition.  Diet: Regular  diet Activity: WBAT Follow-up: in 2 weeks Disposition: Home Discharged Condition: good   Discharge Instructions    Call MD / Call 911   Complete by:  As directed    If you experience chest pain or shortness of breath, CALL 911 and be transported to the hospital emergency room.  If you develope a fever above 101 F, pus (white drainage) or increased drainage or redness at the wound, or calf pain, call your surgeon's office.   Change dressing   Complete by:  As directed    Maintain surgical dressing until follow up in the clinic. If the edges start to pull up, may reinforce with tape. If the dressing is no longer working, may remove and cover with gauze and tape, but must keep the area dry and clean.  Call with any questions or concerns.   Change dressing  Complete by:  As directed    Maintain surgical dressing until follow up in the clinic. If the edges start to pull up, may reinforce with tape. If the dressing is no longer working, may remove and cover with gauze and tape, but must keep the area dry and clean.  Call with any questions or concerns.   Constipation Prevention   Complete by:  As directed    Drink plenty of fluids.  Prune juice may be helpful.  You may use a stool softener, such as Colace (over the counter) 100 mg twice a day.  Use MiraLax (over the counter) for constipation as needed.   Diet - low sodium heart healthy   Complete by:  As directed    Discharge instructions   Complete by:  As directed    Maintain surgical dressing until follow up in the clinic. If the edges start to pull up, may reinforce with tape. If the dressing is no longer working, may remove and cover with gauze and tape, but must keep the area dry and clean.  Follow up in 2 weeks at [facility]. Call with any questions or concerns.   Increase activity slowly as tolerated   Complete by:  As directed    Weight bearing as tolerated with assist device (walker, cane, etc) as directed, use it as long  as suggested by your surgeon or therapist, typically at least 4-6 weeks.   TED hose   Complete by:  As directed    Use stockings (TED hose) for 2 weeks on both leg(s).  You may remove them at night for sleeping.   TED hose   Complete by:  As directed    Use stockings (TED hose) for 2 weeks on both leg(s).  You may remove them at night for sleeping.     Allergies as of 05/13/2018      Reactions   Pravachol [pravastatin Sodium]    Leg pain   Contrast Media [iodinated Diagnostic Agents] Rash      Medication List    TAKE these medications   atorvastatin 10 MG tablet Commonly known as:  LIPITOR TAKE ONE TABLET BY MOUTH EVERY NIGHT AT BEDTIME What changed:  when to take this   Calcium Carb-Ergocalciferol 500-200 MG-UNIT Tabs Take 1 tablet by mouth daily.   diltiazem 120 MG 24 hr capsule Commonly known as:  CARDIZEM CD TAKE 1 CAPSULE BY MOUTH EVERY DAY What changed:  how much to take   Eliquis 5 MG Tabs tablet Generic drug:  apixaban TAKE 1 TABLET BY MOUTH TWICE A DAY What changed:  how much to take   escitalopram 10 MG tablet Commonly known as:  LEXAPRO TAKE 1 TABLET BY MOUTH ONCE DAILY   ferrous sulfate 325 (65 FE) MG tablet Take 1 tablet (325 mg total) by mouth 3 (three) times daily after meals. Then resume normal dose. What changed:    when to take this  additional instructions   fesoterodine 4 MG Tb24 tablet Commonly known as:  TOVIAZ Take 1 tablet (4 mg total) by mouth daily.   flecainide 50 MG tablet Commonly known as:  TAMBOCOR Take 1 tablet (50 mg total) by mouth 2 (two) times daily.   HYDROcodone-acetaminophen 7.5-325 MG tablet Commonly known as:  NORCO Take 1-2 tablets by mouth every 4 (four) hours as needed for severe pain (pain score 7-10).   metFORMIN 500 MG 24 hr tablet Commonly known as:  GLUCOPHAGE-XR TAKE 4 TABLETS BY MOUTH IN THE EVENING WITH  DINNER What changed:  See the new instructions.   methocarbamol 500 MG tablet Commonly known as:   ROBAXIN Take 1 tablet (500 mg total) by mouth every 6 (six) hours as needed for muscle spasms.   multivitamin tablet Take 1 tablet by mouth daily.   sennosides-docusate sodium 8.6-50 MG tablet Commonly known as:  SENOKOT-S Take 1 tablet by mouth daily as needed for constipation.   Tradjenta 5 MG Tabs tablet Generic drug:  linagliptin TAKE 1 TABLET BY MOUTH EVERY DAY   traZODone 50 MG tablet Commonly known as:  DESYREL TAKE 1-2 TABLETS (50-100 MG TOTAL) BY MOUTH AT BEDTIME AS NEEDED FOR SLEEP. What changed:    how much to take  when to take this            Discharge Care Instructions  (From admission, onward)         Start     Ordered   05/13/18 0000  Change dressing    Comments:  Maintain surgical dressing until follow up in the clinic. If the edges start to pull up, may reinforce with tape. If the dressing is no longer working, may remove and cover with gauze and tape, but must keep the area dry and clean.  Call with any questions or concerns.   05/13/18 0914   05/13/18 0000  Change dressing    Comments:  Maintain surgical dressing until follow up in the clinic. If the edges start to pull up, may reinforce with tape. If the dressing is no longer working, may remove and cover with gauze and tape, but must keep the area dry and clean.  Call with any questions or concerns.   05/13/18 0914         Follow-up Information    Paralee Cancel, MD. Schedule an appointment as soon as possible for a visit in 2 week(s).   Specialty:  Orthopedic Surgery Contact information: [address] [place] [address] [phone]           Signed: Griffith Citron, PA-C Orthopedic Surgery 05/13/2018, 3:52 PM

## 2018-05-13 NOTE — Evaluation (Signed)
Physical Therapy Evaluation Patient Details Name: [name] MRN: [id number] DOB: Aug 19, 1945 Today's Date: 05/13/2018   History of Present Illness  R THA-AA  Clinical Impression  Pt ambulated 100' with RW, completed stair training, and demonstrates good understanding of HEP. From PT standpoint, she is ready to DC home.     Follow Up Recommendations Follow surgeon's recommendation for DC plan and follow-up therapies    Equipment Recommendations  None recommended by PT    Recommendations for Other Services       Precautions / Restrictions Precautions Precautions: Fall Restrictions Weight Bearing Restrictions: No RLE Weight Bearing: Weight bearing as tolerated      Mobility  Bed Mobility Overal bed mobility: Needs Assistance Bed Mobility: Supine to Sit     Supine to sit: Supervision     General bed mobility comments: VCs to self assist RLE with LLE, used rail to push up to sitting  Transfers Overall transfer level: Needs assistance Equipment used: Rolling walker (2 wheeled) Transfers: Sit to/from Stand Sit to Stand: Supervision         General transfer comment: VCs hand placement  Ambulation/Gait Ambulation/Gait assistance: Supervision Gait Distance (Feet): 100 Feet Assistive device: Rolling walker (2 wheeled) Gait Pattern/deviations: Step-to pattern;Decreased weight shift to right Gait velocity: decr   General Gait Details: VCs sequencing  Stairs Stairs: Yes Stairs assistance: Min guard Stair Management: One rail Left;With cane Number of Stairs: 2 General stair comments: VCs sequencing  Wheelchair Mobility    Modified Rankin (Stroke Patients Only)       Balance Overall balance assessment: Modified Independent                                           Pertinent Vitals/Pain Pain Assessment: 0-10 Pain Score: 5  Pain Location: R hip walking Pain Descriptors / Indicators: Sore Pain Intervention(s): Limited activity  within patient's tolerance;Monitored during session;Premedicated before session;Ice applied    Home Living Family/patient expects to be discharged to:: Private residence Living Arrangements: Spouse/significant other Available Help at Discharge: Family Type of Home: House Home Access: Stairs to enter Entrance Stairs-Rails: Left Entrance Stairs-Number of Steps: 2 Home Layout: Two level;Able to live on main level with bedroom/bathroom Home Equipment: Gilford Rile - 2 wheels;Cane - single point;Shower seat      Prior Function Level of Independence: Independent with assistive device(s)         Comments: used RW or cane PTA, no falls in past year     Hand Dominance        Extremity/Trunk Assessment   Upper Extremity Assessment Upper Extremity Assessment: Overall WFL for tasks assessed    Lower Extremity Assessment Lower Extremity Assessment: RLE deficits/detail RLE Deficits / Details: R hip flexion -3/5, knee ext -3/5 RLE Sensation: WNL    Cervical / Trunk Assessment Cervical / Trunk Assessment: Normal  Communication   Communication: No difficulties;Prefers language other than English  Cognition Arousal/Alertness: Awake/alert Behavior During Therapy: WFL for tasks assessed/performed Overall Cognitive Status: Within Functional Limits for tasks assessed                                        General Comments      Exercises Total Joint Exercises Ankle Circles/Pumps: AROM;Both;10 reps;Supine Quad Sets: AROM;Both;5 reps;Supine Short Arc Quad: AROM;Right;10 reps;Supine Heel  Slides: AAROM;Right;10 reps;Supine Hip ABduction/ADduction: AAROM;Right;10 reps;Supine Long Arc Quad: AAROM;Right;10 reps;Seated   Assessment/Plan    PT Assessment Patent does not need any further PT services  PT Problem List         PT Treatment Interventions      PT Goals (Current goals can be found in the Care Plan section)  Acute Rehab PT Goals Patient Stated [place] PT Goal Formulation: All assessment and education complete, DC therapy    Frequency     Barriers to discharge        Co-evaluation               AM-PAC PT "6 Clicks" Mobility  Outcome Measure Help needed turning from your back to your side while in a flat bed without using bedrails?: A Little Help needed moving from lying on your back to sitting on the side of a flat bed without using bedrails?: A Little Help needed moving to and from a bed to a chair (including a wheelchair)?: None Help needed standing up from a chair using your arms (e.g., wheelchair or bedside chair)?: None Help needed to walk in hospital room?: None Help needed climbing 3-5 steps with a railing? : A Little 6 Click Score: 21    End of Session Equipment Utilized During Treatment: Gait belt Activity Tolerance: Patient tolerated treatment well Patient left: in chair;with call bell/phone within reach;with family/visitor present Nurse Communication: Mobility status      Time: 4163-8453 PT Time Calculation (min) (ACUTE ONLY): 36 min   Charges:   PT Evaluation $PT Eval Low Complexity: 1 Low PT Treatments $Gait Training: 8-22 mins        [name] PT 05/13/2018  Acute Rehabilitation Services Pager [phone] Office [phone]

## 2018-05-13 NOTE — Progress Notes (Signed)
Reviewed discharge instructions and medications. Removed IV access. Patient and husband verbalize understanding and have no further questions.

## 2018-05-13 NOTE — Telephone Encounter (Signed)
Called patient's husband (DPR). Informed him Norco can cause patient's BP to lower, but pain can make patient's BP elevated. Encouraged patient's husband to check patient's BP if he is worried about patient's BP after taking pain medications. Patient's husband verbalized understanding.

## 2018-05-13 NOTE — Telephone Encounter (Signed)
  Patient had hip surgery and was prescribed HYDROcodone-acetaminophen (NORCO) 7.5-325 MG tablet and her husband would like to know if it is safe for her to take it. The pharmacist said it may raise or lower her blood pressure. Please advise

## 2018-05-13 NOTE — Progress Notes (Signed)
   Subjective: 1 Day Post-Op Procedure(s) (LRB): TOTAL HIP ARTHROPLASTY ANTERIOR APPROACH (Right) Patient reports pain as mild.   Patient seen in rounds by Dr. Alvan Dame. Patient is well, and has had no acute complaints or problems other than pain in the right hip. No acute events overnight. Foley catheter removed, positive flatus.  We will start therapy today.   Objective: Vital signs in last 24 hours: Temp:  [97.5 F (36.4 C)-99.2 F (37.3 C)] 98.6 F (37 C) (03/11 0448) Pulse Rate:  [65-82] 66 (03/11 0448) Resp:  [12-18] 16 (03/11 0448) BP: (124-158)/(55-90) 144/68 (03/11 0448) SpO2:  [94 %-100 %] 94 % (03/11 0448)  Intake/Output from previous day:  Intake/Output Summary (Last 24 hours) at 05/13/2018 0850 Last data filed at 05/13/2018 0448 Gross per 24 hour  Intake 4033.9 ml  Output 4025 ml  Net 8.9 ml     Intake/Output this shift: No intake/output data recorded.  Labs: Recent Labs    05/13/18 0408  HGB 10.5*   Recent Labs    05/13/18 0408  WBC 11.8*  RBC 3.45*  HCT 32.8*  PLT 257   Recent Labs    05/13/18 0408  NA 135  K 4.9  CL 102  CO2 25  BUN 13  CREATININE 0.76  GLUCOSE 241*  CALCIUM 8.7*   No results for input(s): LABPT, INR in the last 72 hours.  Exam: General - Patient is Alert and Oriented Extremity - Neurologically intact Sensation intact distally Intact pulses distally Dorsiflexion/Plantar flexion intact Dressing - dressing C/D/I Motor Function - intact, moving foot and toes well on exam.   Past Medical History:  Diagnosis Date  . Anemia   . Anxiety   . Colon polyps   . Constipation   . Depressive disorder, not elsewhere classified   . Diaphragmatic hernia without mention of obstruction or gangrene   . Disorders of bursae and tendons in shoulder region, unspecified   . Diverticulosis of colon (without mention of hemorrhage)   . External hemorrhoid   . Fundic gland polyps of stomach, benign   . GERD (gastroesophageal reflux  disease)   . History of hiatal hernia   . HTN (hypertension)   . Hypothyroidism   . Internal hemorrhoids without mention of complication   . Moderate mitral regurgitation   . Obesity, unspecified   . OSA on CPAP    compliant with CPAP  . Other urinary incontinence   . Persistent atrial fibrillation   . Pure hypercholesterolemia   . Type II diabetes mellitus ([place])   . Urinary frequency   . Urinary urgency   . Vitamin B 12 deficiency     Assessment/Plan: 1 Day Post-Op Procedure(s) (LRB): TOTAL HIP ARTHROPLASTY ANTERIOR APPROACH (Right) Principal Problem:   S/P right THA Active Problems:   Status post total hip replacement, right  Estimated body mass index is 27.01 kg/m as calculated from the following:   Height as of 05/07/18: 5\' 5"  (1.651 m).   Weight as of 05/07/18: 73.6 kg. Advance diet Up with therapy D/C IV fluids  DVT Prophylaxis - Eliquis Weight bearing as tolerated. D/C O2 and pulse ox and try on room air.   Plan is to go Home after hospital stay with HEP. Plan for discharge today as long as she is meeting goals with therapy. Follow up in 2 weeks in the office.   Griffith Citron, PA-C Orthopedic Surgery 05/13/2018, 8:50 AM

## 2018-05-14 ENCOUNTER — Encounter (HOSPITAL_COMMUNITY): Payer: Self-pay | Admitting: Orthopedic Surgery

## 2018-05-19 ENCOUNTER — Other Ambulatory Visit: Payer: Self-pay | Admitting: Internal Medicine

## 2018-05-19 DIAGNOSIS — R69 Illness, unspecified: Secondary | ICD-10-CM | POA: Diagnosis not present

## 2018-05-22 ENCOUNTER — Telehealth: Payer: Self-pay

## 2018-05-22 NOTE — Telephone Encounter (Signed)
Angie (DPR signed) DPR signed left v/m;pt had hip replacement last wk; now pt has 2 bedsores at base of tail bone. I spoke with angie and she has spoken with surgeon that did hip surgery and pt has appt to see hip surgeon. Nothing further needed. FYI to Dr Diona Browner.

## 2018-05-23 ENCOUNTER — Other Ambulatory Visit: Payer: Self-pay | Admitting: Internal Medicine

## 2018-05-28 ENCOUNTER — Ambulatory Visit: Payer: Medicare HMO

## 2018-06-02 ENCOUNTER — Encounter: Payer: Medicare HMO | Admitting: Family Medicine

## 2018-06-05 ENCOUNTER — Other Ambulatory Visit: Payer: Self-pay | Admitting: Family Medicine

## 2018-06-26 ENCOUNTER — Other Ambulatory Visit: Payer: Self-pay | Admitting: Family Medicine

## 2018-06-29 DIAGNOSIS — Z96641 Presence of right artificial hip joint: Secondary | ICD-10-CM | POA: Diagnosis not present

## 2018-06-29 DIAGNOSIS — Z471 Aftercare following joint replacement surgery: Secondary | ICD-10-CM | POA: Diagnosis not present

## 2018-06-30 ENCOUNTER — Encounter: Payer: Self-pay | Admitting: Internal Medicine

## 2018-06-30 ENCOUNTER — Ambulatory Visit (INDEPENDENT_AMBULATORY_CARE_PROVIDER_SITE_OTHER): Payer: Medicare HMO | Admitting: Internal Medicine

## 2018-06-30 ENCOUNTER — Other Ambulatory Visit: Payer: Self-pay

## 2018-06-30 DIAGNOSIS — E7800 Pure hypercholesterolemia, unspecified: Secondary | ICD-10-CM

## 2018-06-30 DIAGNOSIS — E119 Type 2 diabetes mellitus without complications: Secondary | ICD-10-CM

## 2018-06-30 DIAGNOSIS — E663 Overweight: Secondary | ICD-10-CM

## 2018-06-30 NOTE — Progress Notes (Signed)
Patient ID: [name], female   DOB: [birth year], 73 y.o.   MRN: [id number]  Patient location: Home My location: Office  Referring Provider: Jinny Sanders, MD  I connected with the patient and her husband on 06/30/18 at  8:00 AM EDT by a video enabled telemedicine application and verified that I am speaking with the correct person.   I discussed the limitations of evaluation and management by telemedicine and the availability of in person appointments. The patient expressed understanding and agreed to proceed.   Details of the encounter are shown below.  HPI: [name] is a 73 y.o.-year-old female, presenting for f/u for DM2, dx 1995, insulin-dependent since 2014 - now off insulin since 12/2016, with improved control, without long term complications. Last visit 7 months ago.  At last visit, she was on prednisone for sciatica and had occasional high blood sugars on this, but not since then.  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  Last hemoglobin A1c was: Lab Results  Component Value Date   HGBA1C 6.3 (H) 05/07/2018   HGBA1C 6.2 (A) 04/16/2018   HGBA1C 6.4 (A) 12/01/2017   Pt is on a regimen of: - Metformin ER 1000 mg 2x a day with meals (she tried 2000 mg with dinner but this was not as effective) - Tradjenta 5 mg before lunch She was on Basaglar 12 >> 14 units in hs >> stopped at last visit - but still takes it seldom (10-12 units)  Pt checks her sugars twice a day: - am: 97-120, 145 (cruise) >> 98, 106-120s, 130 >> 100-127 - 2h after b'fast: 90-103 >> 113-141 >> n/c - before lunch:90-130 >> 100-125, 199 >> n/c - 2h after lunch:  160-180 >> 140-160s >> n/c - before dinner: n/c >> 102-144 >> n/c - 2h after dinner: 160-180, 195 (cruise) >> 267T >> 245-809 (certain meals) - bedtime: 120-160 >> 140-160 >> n/c - nighttime: n/c Lowest sugar was 90 >> 98 >> 97; she has hypoglycemia awareness in the 80s. Highest sugar was 195 >> 200 (starch) >>  180.  Pt's meals are: - Breakfast: cereals + 2% milk; bread + cheese; bread + PB - Lunch: sandwich; soup - Dinner: chicken; steak; seafood - Snacks: 2: PB crackers Continues to work on reducing portions.  -No CKD, last BUN/creatinine:  Lab Results  Component Value Date   BUN 13 05/13/2018   CREATININE 0.76 05/13/2018  Not on an ACE inhibitor or ARB. -+ HL; last set of lipids: Lab Results  Component Value Date   CHOL 140 05/26/2017   HDL 51.60 05/26/2017   LDLCALC 78 05/26/2017   LDLDIRECT 163.1 07/04/2011   TRIG 50.0 05/26/2017   CHOLHDL 3 05/26/2017  On Lipitor.   - last eye exam was in 05/2017: No DR. [name]  She had cataract surgery in 04 and 07/2016. [facility] surgery at [facility]: film cleaned from her eye (03/2018). -No numbness and tingling in her feet.  She has A. Fib >> she had several cardioversions. On Flecainide (dose lowered), Diltiazem, Eliquis.  She was previously on Vesicare but had to stop due to cost.  She started [place] before last visit, but this is not working as well.  ROS: Constitutional: no weight gain/+ weight loss, no fatigue, no subjective hyperthermia, no subjective hypothermia Eyes: + blurry vision, no xerophthalmia ENT: no sore throat, no nodules palpated in neck, no dysphagia, no odynophagia, no hoarseness Cardiovascular: no CP/no SOB/no palpitations/no leg swelling Respiratory: no cough/no SOB/no  wheezing Gastrointestinal: no N/no V/no D/no C/no acid reflux Musculoskeletal: no muscle aches/no joint aches Skin: no rashes, no hair loss Neurological: no tremors/no numbness/no tingling/no dizziness  I reviewed pt's medications, allergies, PMH, social hx, family hx, and changes were documented in the history of present illness. Otherwise, unchanged from my initial visit note.  Past Medical History:  Diagnosis Date  . Anemia   . Anxiety   . Colon polyps   . Constipation   . Depressive disorder, not elsewhere classified   . Diaphragmatic hernia  without mention of obstruction or gangrene   . Disorders of bursae and tendons in shoulder region, unspecified   . Diverticulosis of colon (without mention of hemorrhage)   . External hemorrhoid   . Fundic gland polyps of stomach, benign   . GERD (gastroesophageal reflux disease)   . History of hiatal hernia   . HTN (hypertension)   . Hypothyroidism   . Internal hemorrhoids without mention of complication   . Moderate mitral regurgitation   . Obesity, unspecified   . OSA on CPAP    compliant with CPAP  . Other urinary incontinence   . Persistent atrial fibrillation   . Pure hypercholesterolemia   . Type II diabetes mellitus ([place])   . Urinary frequency   . Urinary urgency   . Vitamin B 12 deficiency    Past Surgical History:  Procedure Laterality Date  . ABDOMINAL HYSTERECTOMY  1972   Have both ovaries  . ATRIAL FIBRILLATION ABLATION  12/17/2016  . ATRIAL FIBRILLATION ABLATION N/A 12/17/2016   Procedure: ATRIAL FIBRILLATION ABLATION;  Surgeon: Thompson Grayer, MD;  Location: [facility];  Service: Cardiovascular;  Laterality: N/A;  . [place]   [facility]; "Dr. [name]"  . CARDIOVERSION N/A 05/27/2016   Procedure: CARDIOVERSION;  Surgeon: [name], MD;  Location: [facility];  Service: Cardiovascular;  Laterality: N/A;  . CARDIOVERSION N/A 11/25/2016   Procedure: CARDIOVERSION;  Surgeon: [name], MD;  Location: [facility];  Service: Cardiovascular;  Laterality: N/A;  . CATARACT EXTRACTION W/ INTRAOCULAR LENS  IMPLANT, BILATERAL Bilateral   . COLONOSCOPY    . ELECTROPHYSIOLOGIC STUDY N/A 01/24/2016   Procedure: CARDIOVERSION;  Surgeon: [name], MD;  Location: [facility];  Service: Cardiovascular;  Laterality: N/A;  . LAPAROSCOPIC CHOLECYSTECTOMY    . REDUCTION MAMMAPLASTY Bilateral   . TONSILLECTOMY    . TOTAL HIP ARTHROPLASTY Right 05/12/2018   Procedure: TOTAL HIP ARTHROPLASTY ANTERIOR APPROACH;  Surgeon: Paralee Cancel, MD;  Location: [facility];  Service: Orthopedics;  Laterality: Right;  70 mins   Social History   Socioeconomic History  . Marital status: Married    Spouse name: Laverna Peace  . Number of children: 2  . Years of education: Not on file  . Highest education level: Not on file  Occupational History  . Occupation: retired    Fish farm manager: UNEMPLOYED  Social Needs  . Financial resource strain: Not on file  . Food insecurity:    Worry: Not on file    Inability: Not on file  . Transportation needs:    Medical: Not on file    Non-medical: Not on file  Tobacco Use  . Smoking status: Never Smoker  . Smokeless tobacco: Never Used  Substance and Sexual Activity  . Alcohol use: No    Alcohol/week: 0.0 standard drinks  . Drug use: No  . Sexual activity: Yes  Lifestyle  . Physical activity:    Days per week: Not on file  Minutes per session: Not on file  . Stress: Not on file  Relationships  . Social connections:    Talks on phone: Not on file    Gets together: Not on file    Attends religious service: Not on file    Active member of club or organization: Not on file    Attends meetings of clubs or organizations: Not on file    Relationship status: Not on file  . Intimate partner violence:    Fear of current or ex partner: Not on file    Emotionally abused: Not on file    Physically abused: Not on file    Forced sexual activity: Not on file  Other Topics Concern  . Not on file  Social History Narrative   Exercise walks 3 times daily   Lives with spouse in [place] - retired from employment   1 son and 1 daughter      Diet fruit, occ fast food, salads and lean meat      3 tea/day, no EtOH, no tobacco, no drugs   Current Outpatient Medications on File Prior to Visit  Medication Sig Dispense Refill  . atorvastatin (LIPITOR) 10 MG tablet TAKE 1 TABLET BY MOUTH EVERYDAY AT BEDTIME 90 tablet 0  . Calcium Carb-Ergocalciferol 500-200 MG-UNIT TABS Take 1 tablet by mouth daily.     [name] diltiazem  (CARDIZEM CD) 120 MG 24 hr capsule TAKE 1 CAPSULE BY MOUTH EVERY DAY (Patient taking differently: Take 120 mg by mouth daily. ) 90 capsule 2  . ELIQUIS 5 MG TABS tablet TAKE 1 TABLET BY MOUTH TWICE A DAY (Patient taking differently: Take 5 mg by mouth 2 (two) times daily. ) 60 tablet 5  . escitalopram (LEXAPRO) 10 MG tablet Take 1 tablet by mouth once daily 90 tablet 0  . ferrous sulfate 325 (65 FE) MG tablet Take 1 tablet (325 mg total) by mouth 3 (three) times daily after meals. Then resume normal dose. 28 tablet 3  . fesoterodine (TOVIAZ) 4 MG TB24 tablet Take 1 tablet (4 mg total) by mouth daily. 30 tablet 11  . flecainide (TAMBOCOR) 50 MG tablet Take 1 tablet (50 mg total) by mouth 2 (two) times daily. 180 tablet 2  . glucose blood test strip CHECK BLOOD SUGAR 2 TIMES A DAY AS DIRECTED DX CODE E11.9 100 each 7  . HYDROcodone-acetaminophen (NORCO) 7.5-325 MG tablet Take 1-2 tablets by mouth every 4 (four) hours as needed for severe pain (pain score 7-10). 60 tablet 0  . metFORMIN (GLUCOPHAGE-XR) 500 MG 24 hr tablet TAKE 4 TABLETS BY MOUTH IN THE EVENING WITH DINNER (Patient taking differently: Take 1,000 mg by mouth 2 (two) times daily with a meal. ) 360 tablet 0  . methocarbamol (ROBAXIN) 500 MG tablet Take 1 tablet (500 mg total) by mouth every 6 (six) hours as needed for muscle spasms. 40 tablet 0  . Multiple Vitamin (MULTIVITAMIN) tablet Take 1 tablet by mouth daily.      . sennosides-docusate sodium (SENOKOT-S) 8.6-50 MG tablet Take 1 tablet by mouth daily as needed for constipation.    . TRADJENTA 5 MG TABS tablet TAKE 1 TABLET BY MOUTH EVERY DAY 30 tablet 1  . traZODone (DESYREL) 50 MG tablet TAKE 1-2 TABLETS (50-100 MG TOTAL) BY MOUTH AT BEDTIME AS NEEDED FOR SLEEP. (Patient taking differently: Take 50 mg by mouth at bedtime. ) 180 tablet 1   No current facility-administered medications on file prior to visit.  Allergies  Allergen Reactions  . Pravachol [Pravastatin Sodium]     Leg  pain   . Contrast Media [Iodinated Diagnostic Agents] Rash   Family History  Problem Relation Age of Onset  . Cancer Mother        fallopian tube  . Dementia Mother   . Pulmonary embolism Mother   . Hyperlipidemia Mother   . Diabetes Mother   . Hyperlipidemia Father   . Lung cancer Father   . Hypertension Sister   . Diabetes Brother   . Stroke Paternal Grandmother   . Stroke Paternal Grandfather   . Colon cancer Neg Hx   . Esophageal cancer Neg Hx     PE: Weight: 157 lbs LMP  (LMP Unknown)  There is no height or weight on file to calculate BMI. Wt Readings from Last 3 Encounters:  05/13/18 160 lb 15 oz (73 kg)  05/07/18 162 lb 5 oz (73.6 kg)  05/01/18 167 lb (75.8 kg)   Constitutional:  in NAD  The physical exam was not performed (virtual visit).  ASSESSMENT: 1. DM2, previously insulin-dependent, controlled, without long term complications, but with hyperglycemia - she was contemplating gastric sleeve sx >> on hold now  2. Overweight  3. Hyperlipidemia  PLAN:  1. Patient with longstanding, now controlled, type 2 diabetes, initially insulin-dependent, now on insulin after she started to adjust her diet and lose weight.  She continues on metformin and Tradjenta which she tolerates well.  At last visit, sugars were still slightly higher after dinner, but improved from before.  She had occasional hyperglycemic spikes and usually her sugars were increasing to 200 when she is on prednisone.  No prednisone since last visit, as she is not tolerating this well due to insomnia -At this visit, sugars are stable from before, at goal in the morning and after dinner, although they are in the upper target range after this meal.  She is not checking at other times of the day. -I do not feel we need to change her regimen for now - I suggested to:  Patient Instructions  Please continue: - Metformin ER 1000 mg 2x a day with meals - Tradjenta 5 mg before lunch  Please come back for a  follow-up appointment in 4-6 months.  - Latest HbA1c was reviewed with the patient: 6.3% last month, improved from last visit - continue checking sugars at different times of the day - check 1x a day, rotating checks - advised for yearly eye exams >> she is UTD - Return to clinic in 4-6 mo with sugar log     2. Overweight - lost 17 lbs since last OV (!) -initially she could not eat due to hip pain, but after the surgery, she kept the weight off, intentionally, by reducing portions -She was previously seeing Dr. Leafy Ro in the weight loss center, but not anymore -Continue metformin and Tradjenta, which are weight neutral  3.  Hyperlipidemia - Reviewed latest lipid panel from a year ago: LDL much improved now at goal Lab Results  Component Value Date   CHOL 140 05/26/2017   HDL 51.60 05/26/2017   LDLCALC 78 05/26/2017   LDLDIRECT 163.1 07/04/2011   TRIG 50.0 05/26/2017   CHOLHDL 3 05/26/2017  - Continues Lipitor without side effects.  [name], MD PhD [facility] Endocrinology

## 2018-06-30 NOTE — Patient Instructions (Signed)
Please continue: - Metformin ER 1000 mg 2x a day with meals - Tradjenta 5 mg before lunch  Please come back for a follow-up appointment in 4-6 months. 

## 2018-07-06 ENCOUNTER — Other Ambulatory Visit: Payer: Self-pay | Admitting: Family Medicine

## 2018-07-06 DIAGNOSIS — Z1231 Encounter for screening mammogram for malignant neoplasm of breast: Secondary | ICD-10-CM

## 2018-07-13 ENCOUNTER — Other Ambulatory Visit: Payer: Self-pay

## 2018-07-13 ENCOUNTER — Ambulatory Visit
Admission: RE | Admit: 2018-07-13 | Discharge: 2018-07-13 | Disposition: A | Discharge status: Home / Self Care | Payer: Medicare HMO | Source: Ambulatory Visit | Attending: Family Medicine | Admitting: Family Medicine

## 2018-07-13 DIAGNOSIS — Z1231 Encounter for screening mammogram for malignant neoplasm of breast: Secondary | ICD-10-CM

## 2018-07-28 ENCOUNTER — Telehealth: Payer: Self-pay

## 2018-07-28 ENCOUNTER — Ambulatory Visit: Payer: Medicare HMO | Admitting: Podiatry

## 2018-07-28 ENCOUNTER — Encounter: Payer: Self-pay | Admitting: Podiatry

## 2018-07-28 ENCOUNTER — Other Ambulatory Visit: Payer: Self-pay

## 2018-07-28 VITALS — BP 119/63 | HR 86 | Temp 97.8°F

## 2018-07-28 DIAGNOSIS — L603 Nail dystrophy: Secondary | ICD-10-CM

## 2018-07-28 NOTE — Patient Instructions (Signed)
Urea 40% topical gel (available on [employer]) Recommend Biotin Supplement

## 2018-07-28 NOTE — Telephone Encounter (Signed)
Called patient from recall list.  No answer. LMOV.  This is the first attempt per recall list.  

## 2018-07-30 NOTE — Progress Notes (Signed)
   HPI: 73 year old female presenting today as a new patient with a chief complaint of nail discoloration noted bilaterally that began 1-2 weeks ago. She states her left 2nd toenail is starting to split. She has not done anything for treatment and denies modifying factors. Patient is here for further evaluation and treatment.   Past Medical History:  Diagnosis Date  . Anemia   . Anxiety   . Colon polyps   . Constipation   . Depressive disorder, not elsewhere classified   . Diaphragmatic hernia without mention of obstruction or gangrene   . Disorders of bursae and tendons in shoulder region, unspecified   . Diverticulosis of colon (without mention of hemorrhage)   . External hemorrhoid   . Fundic gland polyps of stomach, benign   . GERD (gastroesophageal reflux disease)   . History of hiatal hernia   . HTN (hypertension)   . Hypothyroidism   . Internal hemorrhoids without mention of complication   . Moderate mitral regurgitation   . Obesity, unspecified   . OSA on CPAP    compliant with CPAP  . Other urinary incontinence   . Persistent atrial fibrillation   . Pure hypercholesterolemia   . Type II diabetes mellitus ([place])   . Urinary frequency   . Urinary urgency   . Vitamin B 12 deficiency      Physical Exam: General: The patient is alert and oriented x3 in no acute distress.  Dermatology: Splitting of nails noted bilaterally. Skin is warm, dry and supple bilateral lower extremities. Negative for open lesions or macerations.  Vascular: Palpable pedal pulses bilaterally. No edema or erythema noted. Capillary refill within normal limits.  Neurological: Epicritic and protective threshold grossly intact bilaterally.   Musculoskeletal Exam: Range of motion within normal limits to all pedal and ankle joints bilateral. Muscle strength 5/5 in all groups bilateral.   Assessment: 1. Splitting of nails noted bilaterally    Plan of Care:  1. Patient evaluated. 2. Recommended OTC  Biotin supplement.  3. Recommended OTC Urea 40% topical gel.  4. Return to clinic as needed.       Edrick Kins, DPM [facility]  Dr. Edrick Kins, DPM    [address], [place] [address]                Office [phone]  Fax [phone]

## 2018-08-10 ENCOUNTER — Other Ambulatory Visit: Payer: Self-pay

## 2018-08-10 ENCOUNTER — Other Ambulatory Visit: Payer: Self-pay | Admitting: Internal Medicine

## 2018-08-10 MED ORDER — APIXABAN 5 MG PO TABS: 5.0000 mg | ORAL_TABLET | Freq: Two times a day (BID) | ORAL | 5 refills | Status: DC

## 2018-08-13 ENCOUNTER — Encounter: Payer: Self-pay | Admitting: Internal Medicine

## 2018-08-13 DIAGNOSIS — H524 Presbyopia: Secondary | ICD-10-CM | POA: Diagnosis not present

## 2018-08-13 DIAGNOSIS — E119 Type 2 diabetes mellitus without complications: Secondary | ICD-10-CM | POA: Diagnosis not present

## 2018-08-13 LAB — HM DIABETES EYE EXAM

## 2018-08-14 ENCOUNTER — Encounter: Payer: Self-pay | Admitting: Internal Medicine

## 2018-08-15 DIAGNOSIS — R69 Illness, unspecified: Secondary | ICD-10-CM | POA: Diagnosis not present

## 2018-08-17 ENCOUNTER — Ambulatory Visit (INDEPENDENT_AMBULATORY_CARE_PROVIDER_SITE_OTHER): Payer: Medicare HMO

## 2018-08-17 ENCOUNTER — Other Ambulatory Visit (INDEPENDENT_AMBULATORY_CARE_PROVIDER_SITE_OTHER): Payer: Medicare HMO

## 2018-08-17 ENCOUNTER — Other Ambulatory Visit: Payer: Self-pay

## 2018-08-17 DIAGNOSIS — Z0000 Encounter for general adult medical examination without abnormal findings: Secondary | ICD-10-CM

## 2018-08-17 DIAGNOSIS — D508 Other iron deficiency anemias: Secondary | ICD-10-CM

## 2018-08-17 DIAGNOSIS — Z9289 Personal history of other medical treatment: Secondary | ICD-10-CM

## 2018-08-17 DIAGNOSIS — I10 Essential (primary) hypertension: Secondary | ICD-10-CM

## 2018-08-17 DIAGNOSIS — E119 Type 2 diabetes mellitus without complications: Secondary | ICD-10-CM | POA: Diagnosis not present

## 2018-08-17 DIAGNOSIS — E538 Deficiency of other specified B group vitamins: Secondary | ICD-10-CM | POA: Diagnosis not present

## 2018-08-17 DIAGNOSIS — E559 Vitamin D deficiency, unspecified: Secondary | ICD-10-CM

## 2018-08-17 DIAGNOSIS — E7800 Pure hypercholesterolemia, unspecified: Secondary | ICD-10-CM

## 2018-08-17 LAB — CBC WITH DIFFERENTIAL/PLATELET
Basophils Absolute: 0 10*3/uL (ref 0.0–0.1)
Basophils Relative: 0.3 % (ref 0.0–3.0)
Eosinophils Absolute: 0.1 10*3/uL (ref 0.0–0.7)
Eosinophils Relative: 1.2 % (ref 0.0–5.0)
HCT: 35.9 % — ABNORMAL LOW (ref 36.0–46.0)
Hemoglobin: 11.8 g/dL — ABNORMAL LOW (ref 12.0–15.0)
Lymphocytes Relative: 30.5 % (ref 12.0–46.0)
Lymphs Abs: 1.8 10*3/uL (ref 0.7–4.0)
MCHC: 32.9 g/dL (ref 30.0–36.0)
MCV: 89 fl (ref 78.0–100.0)
Monocytes Absolute: 0.4 10*3/uL (ref 0.1–1.0)
Monocytes Relative: 6.6 % (ref 3.0–12.0)
Neutro Abs: 3.7 10*3/uL (ref 1.4–7.7)
Neutrophils Relative %: 61.4 % (ref 43.0–77.0)
Platelets: 285 10*3/uL (ref 150.0–400.0)
RBC: 4.04 Mil/uL (ref 3.87–5.11)
RDW: 14.2 % (ref 11.5–15.5)
WBC: 6 10*3/uL (ref 4.0–10.5)

## 2018-08-17 LAB — LIPID PANEL
Cholesterol: 153 mg/dL (ref 0–200)
HDL: 43.8 mg/dL (ref >39.00)
LDL Cholesterol: 91 mg/dL (ref 0–99)
NonHDL: 109.44
Total CHOL/HDL Ratio: 3
Triglycerides: 92 mg/dL (ref 0.0–149.0)
VLDL: 18.4 mg/dL (ref 0.0–40.0)

## 2018-08-17 LAB — COMPREHENSIVE METABOLIC PANEL
ALT: 9 U/L (ref 0–35)
AST: 11 U/L (ref 0–37)
Albumin: 4.2 g/dL (ref 3.5–5.2)
Alkaline Phosphatase: 54 U/L (ref 39–117)
BUN: 13 mg/dL (ref 6–23)
CO2: 28 mEq/L (ref 19–32)
Calcium: 9.2 mg/dL (ref 8.4–10.5)
Chloride: 100 mEq/L (ref 96–112)
Creatinine, Ser: 0.84 mg/dL (ref 0.40–1.20)
GFR: 66.76 mL/min (ref >60.00)
Glucose, Bld: 93 mg/dL (ref 70–99)
Potassium: 4.8 mEq/L (ref 3.5–5.1)
Sodium: 135 mEq/L (ref 135–145)
Total Bilirubin: 0.3 mg/dL (ref 0.2–1.2)
Total Protein: 6.3 g/dL (ref 6.0–8.3)

## 2018-08-17 LAB — FERRITIN: Ferritin: 42 ng/mL (ref 10.0–291.0)

## 2018-08-17 LAB — MICROALBUMIN / CREATININE URINE RATIO
Creatinine,U: 104.5 mg/dL
Microalb Creat Ratio: 1.9 mg/g (ref 0.0–30.0)
Microalb, Ur: 2 mg/dL — ABNORMAL HIGH (ref 0.0–1.9)

## 2018-08-17 LAB — VITAMIN B12: Vitamin B-12: 257 pg/mL (ref 211–911)

## 2018-08-17 LAB — VITAMIN D 25 HYDROXY (VIT D DEFICIENCY, FRACTURES): VITD: 50.56 ng/mL (ref 30.00–100.00)

## 2018-08-17 LAB — IBC PANEL
Iron: 64 ug/dL (ref 42–145)
Saturation Ratios: 20.4 % (ref 20.0–50.0)
Transferrin: 224 mg/dL (ref 212.0–360.0)

## 2018-08-17 NOTE — Progress Notes (Signed)
PCP notes:   Health maintenance:  Foot exam - to be determined  Abnormal screenings:   Fall risk - hx of single fall Fall Risk  08/17/2018 05/26/2017 05/10/2016 05/16/2015 05/13/2014  Falls in the past year? 1 No No No No  Comment accidental fall in yard - - - -  Number falls in past yr: 0 - - - -  Injury with Fall? 1 - - - -    Patient concerns:   None  Nurse concerns:  None  Next PCP appt:   08/21/18 @ 1540

## 2018-08-17 NOTE — Patient Instructions (Signed)
Ms. Tagliaferro , Thank you for taking time to come for your Medicare Wellness Visit. I appreciate your ongoing commitment to your health goals. Please review the following plan we discussed and let me know if I can assist you in the future.   These are the goals we discussed: Goals    . DIET - INCREASE WATER INTAKE     Starting 08/17/2018, I will attempt to drink at least 6 glasses of water daily.        This is a list of the screening recommended for you and due dates:  Health Maintenance  Topic Date Due  . Complete foot exam   08/21/2018*  . Flu Shot  10/03/2018  . Hemoglobin A1C  11/07/2018  . Mammogram  07/13/2019  . Eye exam for diabetics  08/13/2019  . Urine Protein Check  08/17/2019  . Tetanus Vaccine  04/10/2020  . Colon Cancer Screening  09/03/2027  . DEXA scan (bone density measurement)  Completed  .  Hepatitis C: One time screening is recommended by Center for Disease Control  (CDC) for  adults born from [birth year] through 1963.   Completed  . Pneumonia vaccines  Completed  *Topic was postponed. The date shown is not the original due date.   Preventive Care for Adults  A healthy lifestyle and preventive care can promote health and wellness. Preventive health guidelines for adults include the following key practices.  . A routine yearly physical is a good way to check with your health care provider about your health and preventive screening. It is a chance to share any concerns and updates on your health and to receive a thorough exam.  . Visit your dentist for a routine exam and preventive care every 6 months. Brush your teeth twice a day and floss once a day. Good oral hygiene prevents tooth decay and gum disease.  . The frequency of eye exams is based on your age, health, family medical history, use  of contact lenses, and other factors. Follow your health care provider's recommendations for frequency of eye exams.  . Eat a healthy diet. Foods like vegetables, fruits, whole  grains, low-fat dairy products, and lean protein foods contain the nutrients you need without too many calories. Decrease your intake of foods high in solid fats, added sugars, and salt. Eat the right amount of calories for you. Get information about a proper diet from your health care provider, if necessary.  . Regular physical exercise is one of the most important things you can do for your health. Most adults should get at least 150 minutes of moderate-intensity exercise (any activity that increases your heart rate and causes you to sweat) each week. In addition, most adults need muscle-strengthening exercises on 2 or more days a week.  Silver Sneakers may be a benefit available to you. To determine eligibility, you may visit the website: www.silversneakers.com or contact program at 847-831-9113 Mon-Fri between 8AM-8PM.   . Maintain a healthy weight. The body mass index (BMI) is a screening tool to identify possible weight problems. It provides an estimate of body fat based on height and weight. Your health care provider can find your BMI and can help you achieve or maintain a healthy weight.   For adults 20 years and older: ? A BMI below 18.5 is considered underweight. ? A BMI of 18.5 to 24.9 is normal. ? A BMI of 25 to 29.9 is considered overweight. ? A BMI of 30 and above is considered obese.   [name]  Maintain normal blood lipids and cholesterol levels by exercising and minimizing your intake of saturated fat. Eat a balanced diet with plenty of fruit and vegetables. Blood tests for lipids and cholesterol should begin at age 79 and be repeated every 5 years. If your lipid or cholesterol levels are high, you are over 50, or you are at high risk for heart disease, you may need your cholesterol levels checked more frequently. Ongoing high lipid and cholesterol levels should be treated with medicines if diet and exercise are not working.  . If you smoke, find out from your health care provider how to  quit. If you do not use tobacco, please do not start.  . If you choose to drink alcohol, please do not consume more than 2 drinks per day. One drink is considered to be 12 ounces (355 mL) of beer, 5 ounces (148 mL) of wine, or 1.5 ounces (44 mL) of liquor.  . If you are 88-44 years old, ask your health care provider if you should take aspirin to prevent strokes.  . Use sunscreen. Apply sunscreen liberally and repeatedly throughout the day. You should seek shade when your shadow is shorter than you. Protect yourself by wearing long sleeves, pants, a wide-brimmed hat, and sunglasses year round, whenever you are outdoors.  . Once a month, do a whole body skin exam, using a mirror to look at the skin on your back. Tell your health care provider of new moles, moles that have irregular borders, moles that are larger than a pencil eraser, or moles that have changed in shape or color.

## 2018-08-17 NOTE — Progress Notes (Signed)
Subjective:   [name] is a 73 y.o. female who presents for Medicare Annual (Subsequent) preventive examination.  Review of Systems:  N/A Cardiac Risk Factors include: advanced age (>78men, >21 women);diabetes mellitus;dyslipidemia;hypertension     Objective:     Vitals: LMP  (LMP Unknown)   There is no height or weight on file to calculate BMI.  Advanced Directives 08/17/2018 05/12/2018 05/07/2018 12/30/2017 05/26/2017 12/17/2016 12/17/2016  Does Patient Have a Medical Advance Directive? Yes Yes Yes Yes Yes Yes Yes  Type of Paramedic of [place];Living will [place];Living will [place];Living will Living will [place];Living will [place];Living will [place];Living will  Does patient want to make changes to medical advance directive? - No - Patient declined No - Patient declined - - No - Patient declined -  Copy of [place] in Chart? No - copy requested Yes - validated most recent copy scanned in chart (See row information) Yes - validated most recent copy scanned in chart (See row information) - No - copy requested No - copy requested -    Tobacco Social History   Tobacco Use  Smoking Status Never Smoker  Smokeless Tobacco Never Used     Counseling given: No   Clinical Intake:  Pre-visit preparation completed: Yes  Pain : No/denies pain Pain Score: 0-No pain     Nutritional Status: BMI 25 -29 Overweight Nutritional Risks: None Diabetes: No  How often do you need to have someone help you when you read instructions, pamphlets, or other written materials from your doctor or pharmacy?: 1 - Never What is the last grade level you completed in school?: 12th grade  Interpreter Needed?: No  Comments: pt lives with spouse Information entered by :: LPinson, LPN  Past Medical History:  Diagnosis Date  . Anemia   . Anxiety    . Colon polyps   . Constipation   . Depressive disorder, not elsewhere classified   . Diaphragmatic hernia without mention of obstruction or gangrene   . Disorders of bursae and tendons in shoulder region, unspecified   . Diverticulosis of colon (without mention of hemorrhage)   . External hemorrhoid   . Fundic gland polyps of stomach, benign   . GERD (gastroesophageal reflux disease)   . History of hiatal hernia   . HTN (hypertension)   . Hypothyroidism   . Internal hemorrhoids without mention of complication   . Moderate mitral regurgitation   . Obesity, unspecified   . OSA on CPAP    compliant with CPAP  . Other urinary incontinence   . Persistent atrial fibrillation   . Pure hypercholesterolemia   . Type II diabetes mellitus ([place])   . Urinary frequency   . Urinary urgency   . Vitamin B 12 deficiency    Past Surgical History:  Procedure Laterality Date  . ABDOMINAL HYSTERECTOMY  1972   Have both ovaries  . ATRIAL FIBRILLATION ABLATION  12/17/2016  . ATRIAL FIBRILLATION ABLATION N/A 12/17/2016   Procedure: ATRIAL FIBRILLATION ABLATION;  Surgeon: Thompson Grayer, MD;  Location: [facility];  Service: Cardiovascular;  Laterality: N/A;  . [place]   [facility]; "Dr. [name]"  . CARDIOVERSION N/A 05/27/2016   Procedure: CARDIOVERSION;  Surgeon: [name], MD;  Location: [facility];  Service: Cardiovascular;  Laterality: N/A;  . CARDIOVERSION N/A 11/25/2016   Procedure: CARDIOVERSION;  Surgeon: [name], MD;  Location: [facility];  Service: Cardiovascular;  Laterality: N/A;  . CATARACT EXTRACTION W/ INTRAOCULAR LENS  IMPLANT, BILATERAL Bilateral   . COLONOSCOPY    . ELECTROPHYSIOLOGIC STUDY N/A 01/24/2016   Procedure: CARDIOVERSION;  Surgeon: [name], MD;  Location: [facility];  Service: Cardiovascular;  Laterality: N/A;  . LAPAROSCOPIC CHOLECYSTECTOMY    . REDUCTION MAMMAPLASTY Bilateral   . TONSILLECTOMY    . TOTAL HIP ARTHROPLASTY Right  05/12/2018   Procedure: TOTAL HIP ARTHROPLASTY ANTERIOR APPROACH;  Surgeon: Paralee Cancel, MD;  Location: [facility];  Service: Orthopedics;  Laterality: Right;  70 mins   Family History  Problem Relation Age of Onset  . Cancer Mother        fallopian tube  . Dementia Mother   . Pulmonary embolism Mother   . Hyperlipidemia Mother   . Diabetes Mother   . Hyperlipidemia Father   . Lung cancer Father   . Hypertension Sister   . Diabetes Brother   . Stroke Paternal Grandmother   . Stroke Paternal Grandfather   . Colon cancer Neg Hx   . Esophageal cancer Neg Hx    Social History   Socioeconomic History  . Marital status: Married    Spouse name: Laverna Peace  . Number of children: 2  . Years of education: Not on file  . Highest education level: Not on file  Occupational History  . Occupation: retired    Fish farm manager: UNEMPLOYED  Social Needs  . Financial resource strain: Not on file  . Food insecurity    Worry: Not on file    Inability: Not on file  . Transportation needs    Medical: Not on file    Non-medical: Not on file  Tobacco Use  . Smoking status: Never Smoker  . Smokeless tobacco: Never Used  Substance and Sexual Activity  . Alcohol use: No    Alcohol/week: 0.0 standard drinks  . Drug use: No  . Sexual activity: Yes  Lifestyle  . Physical activity    Days per week: Not on file    Minutes per session: Not on file  . Stress: Not on file  Relationships  . Social Herbalist on phone: Not on file    Gets together: Not on file    Attends religious service: Not on file    Active member of club or organization: Not on file    Attends meetings of clubs or organizations: Not on file    Relationship status: Not on file  Other Topics Concern  . Not on file  Social History Narrative   Exercise walks 3 times daily   Lives with spouse in [place] - retired from employment   1 son and 1 daughter      Diet fruit, occ fast food, salads and lean meat       3 tea/day, no EtOH, no tobacco, no drugs    Outpatient Encounter Medications as of 08/17/2018  Medication Sig  . apixaban (ELIQUIS) 5 MG TABS tablet Take 1 tablet (5 mg total) by mouth 2 (two) times daily.  [name] atorvastatin (LIPITOR) 10 MG tablet TAKE 1 TABLET BY MOUTH EVERYDAY AT BEDTIME  . Calcium Carb-Ergocalciferol 500-200 MG-UNIT TABS Take 1 tablet by mouth daily.   [name] diltiazem (CARDIZEM CD) 120 MG 24 hr capsule TAKE 1 CAPSULE BY MOUTH EVERY DAY (Patient taking differently: Take 120 mg by mouth daily. )  . escitalopram (LEXAPRO) 10 MG tablet Take 1 tablet by mouth once  daily  . ferrous sulfate 325 (65 FE) MG tablet Take 1 tablet (325 mg total) by mouth 3 (three) times daily after meals. Then resume normal dose.  . fesoterodine (TOVIAZ) 4 MG TB24 tablet Take 1 tablet (4 mg total) by mouth daily.  . flecainide (TAMBOCOR) 50 MG tablet Take 1 tablet (50 mg total) by mouth 2 (two) times daily.  [name] glucose blood test strip CHECK BLOOD SUGAR 2 TIMES A DAY AS DIRECTED DX CODE E11.9  . HYDROcodone-acetaminophen (NORCO) 7.5-325 MG tablet Take 1-2 tablets by mouth every 4 (four) hours as needed for severe pain (pain score 7-10).  . metFORMIN (GLUCOPHAGE-XR) 500 MG 24 hr tablet TAKE 4 TABLETS BY MOUTH IN THE EVENING WITH DINNER (Patient taking differently: Take 1,000 mg by mouth 2 (two) times daily with a meal. )  . methocarbamol (ROBAXIN) 500 MG tablet Take 1 tablet (500 mg total) by mouth every 6 (six) hours as needed for muscle spasms.  . Multiple Vitamin (MULTIVITAMIN) tablet Take 1 tablet by mouth daily.    . sennosides-docusate sodium (SENOKOT-S) 8.6-50 MG tablet Take 1 tablet by mouth daily as needed for constipation.  . TRADJENTA 5 MG TABS tablet TAKE 1 TABLET BY MOUTH EVERY DAY  . traZODone (DESYREL) 50 MG tablet TAKE 1-2 TABLETS (50-100 MG TOTAL) BY MOUTH AT BEDTIME AS NEEDED FOR SLEEP. (Patient taking differently: Take 50 mg by mouth at bedtime. )   No facility-administered encounter  medications on file as of 08/17/2018.     Activities of Daily Living In your present state of health, do you have any difficulty performing the following activities: 08/17/2018 05/12/2018  Hearing? N Y  S[place]ly? N N  Difficulty concentrating or making decisions? N Y  Comment - -  Walking or climbing stairs? N Y  Dressing or bathing? N N  Doing errands, shopping? N N  Preparing Food and eating ? N -  Using the Toilet? N -  In the past six months, have you accidently leaked urine? Y -  Do you have problems with loss of bowel control? N -  Managing your Medications? N -  Managing your Finances? N -  Housekeeping or managing your Housekeeping? N -  Some recent data might be hidden    Patient Care Team: Jinny Sanders, MD as PCP - General [name], MD as PCP - Cardiology (Cardiology) [name], MD as Consulting Physician (Internal Medicine) [name], MD as Consulting Physician (Cardiology) Wilhelmina Mcardle, MD as Consulting Physician (Pulmonary Disease)    Assessment:   This is a routine wellness examination for [place].   Hearing Screening   125Hz  250Hz  500Hz  1000Hz  2000Hz  3000Hz  4000Hz  6000Hz  8000Hz   Right ear:           Left ear:           Vision Screening Comments: Vision exam in Jun 2020 @ [facility] and Dietary recommendations Current Exercise Habits: Home exercise routine, Type of exercise: walking, Time (Minutes): 30, Frequency (Times/Week): 2, Weekly Exercise (Minutes/Week): 60, Intensity: Mild, Exercise limited by: None identified  Goals    . DIET - INCREASE WATER INTAKE     Starting 08/17/2018, I will attempt to drink at least 6 glasses of water daily.        Fall Risk Fall Risk  08/17/2018 05/26/2017 05/10/2016 05/16/2015 05/13/2014  Falls in the past year? 1 No No No No  Comment accidental fall in yard - - - -  Number falls in past yr: 0 - - - -  Injury with Fall? 1 - - - -   Depression Screen  PHQ 2/9 Scores 08/17/2018 05/26/2017 11/20/2016 05/10/2016  PHQ - 2 Score 0 0 5 0  PHQ- 9 Score 0 0 19 -     Cognitive Function MMSE - Mini Mental State Exam 08/17/2018 05/26/2017 05/10/2016  Orientation to time 5 5 5   Orientation to Place 5 5 5   Registration 3 3 3   Attention/ Calculation 0 0 0  Recall 3 3 2   Recall-comments - - pt was unable to recall 1 of 3 words  Language- name 2 objects 0 0 0  Language- repeat 1 1 1   Language- follow 3 step command 0 3 3  Language- read & follow direction 0 0 0  Write a sentence 0 0 0  Copy design 0 0 0  Total score 17 20 19      PLEASE NOTE: A Mini-Cog screen was completed. Maximum score is 17. A value of 0 denotes this part of [place] MMSE was not completed or the patient failed this part of the Mini-Cog screening.   Mini-Cog Screening Orientation to Time - Max 5 pts Orientation to Place - Max 5 pts Registration - Max 3 pts Recall - Max 3 pts Language Repeat - Max 1 pts      Immunization History  Administered Date(s) Administered  . Influenza Split 01/03/2011, 01/17/2012  . Influenza Whole 11/25/2007, 01/18/2009, 11/28/2009  . Influenza, High Dose Seasonal PF 12/19/2016, 12/01/2017  . Influenza,inj,Quad PF,6+ Mos 01/06/2013, 12/07/2013, 12/14/2014, 11/24/2015  . Pneumococcal Conjugate-13 05/11/2013  . Pneumococcal Polysaccharide-23 01/17/2012, 05/26/2017  . Td 04/10/2010  . Zoster 05/13/2014    Screening Tests Health Maintenance  Topic Date Due  . FOOT EXAM  08/21/2018 (Originally 05/31/2018)  . INFLUENZA VACCINE  10/03/2018  . HEMOGLOBIN A1C  11/07/2018  . MAMMOGRAM  07/13/2019  . OPHTHALMOLOGY EXAM  08/13/2019  . URINE MICROALBUMIN  08/17/2019  . TETANUS/TDAP  04/10/2020  . COLONOSCOPY  09/03/2027  . DEXA SCAN  Completed  . Hepatitis C Screening  Completed  . PNA vac Low Risk Adult  Completed    Plan:     I have personally reviewed, addressed, and noted the following in the patient's chart:  A. Medical and social history  B. Use of alcohol, tobacco or illicit drugs  C. Current medications and supplements D. Functional ability and status E.  Nutritional status F.  Physical activity G. Advance directives H. List of other physicians I.  Hospitalizations, surgeries, and ER visits in previous 12 months J.  Vitals (unless it is a telemedicine encounter) K. Screenings to include cognitive, depression, hearing, vision (NOTE: hearing and vision screenings not completed in telemedicine encounter) L. Referrals and appointments   In addition, I have reviewed and discussed with patient certain preventive protocols, quality metrics, and best practice recommendations. A written personalized care plan for preventive services and recommendations were provided to patient.  With patient's permission, we connected on 08/17/18 at  9:30 AM EDT. Interactive audio and video telecommunications were attempted with patient. This attempt was unsuccessful due to patient having technical difficulties OR patient did not have access to video capability.  Encounter was completed with audio only.  Two patient identifiers were used to ensure the encounter occurred with the correct person. Patient was in home and writer was in office.   Signed,   Lindell Noe, MHA, BS, RN Health Coach

## 2018-08-18 ENCOUNTER — Other Ambulatory Visit: Payer: Self-pay | Admitting: Family Medicine

## 2018-08-18 NOTE — Progress Notes (Signed)
I reviewed health advisor's note, was available for consultation, and agree with documentation and plan.   Signed,  Adisson Deak T. Hendy Brindle, MD  

## 2018-08-21 ENCOUNTER — Encounter: Payer: Self-pay | Admitting: Family Medicine

## 2018-08-21 ENCOUNTER — Other Ambulatory Visit: Payer: Self-pay

## 2018-08-21 ENCOUNTER — Ambulatory Visit (INDEPENDENT_AMBULATORY_CARE_PROVIDER_SITE_OTHER): Payer: Medicare HMO | Admitting: Family Medicine

## 2018-08-21 VITALS — BP 106/60 | HR 76 | Temp 98.2°F | Ht 64.75 in | Wt 159.5 lb

## 2018-08-21 DIAGNOSIS — I10 Essential (primary) hypertension: Secondary | ICD-10-CM

## 2018-08-21 DIAGNOSIS — E7800 Pure hypercholesterolemia, unspecified: Secondary | ICD-10-CM

## 2018-08-21 DIAGNOSIS — I4819 Other persistent atrial fibrillation: Secondary | ICD-10-CM

## 2018-08-21 DIAGNOSIS — E119 Type 2 diabetes mellitus without complications: Secondary | ICD-10-CM | POA: Diagnosis not present

## 2018-08-21 DIAGNOSIS — E559 Vitamin D deficiency, unspecified: Secondary | ICD-10-CM | POA: Diagnosis not present

## 2018-08-21 DIAGNOSIS — R69 Illness, unspecified: Secondary | ICD-10-CM | POA: Diagnosis not present

## 2018-08-21 DIAGNOSIS — D508 Other iron deficiency anemias: Secondary | ICD-10-CM

## 2018-08-21 DIAGNOSIS — E538 Deficiency of other specified B group vitamins: Secondary | ICD-10-CM | POA: Diagnosis not present

## 2018-08-21 DIAGNOSIS — F325 Major depressive disorder, single episode, in full remission: Secondary | ICD-10-CM

## 2018-08-21 DIAGNOSIS — Z0000 Encounter for general adult medical examination without abnormal findings: Secondary | ICD-10-CM

## 2018-08-21 LAB — HM DIABETES FOOT EXAM

## 2018-08-21 NOTE — Assessment & Plan Note (Signed)
Resolved on supplementation. 

## 2018-08-21 NOTE — Assessment & Plan Note (Signed)
Well controlled , followed by Dr. Cruzita Lederer ENDO

## 2018-08-21 NOTE — Assessment & Plan Note (Signed)
Well controlled. Continue current medication.  

## 2018-08-21 NOTE — Assessment & Plan Note (Signed)
No issues. Neg PHQ9

## 2018-08-21 NOTE — Progress Notes (Signed)
Chief Complaint  Patient presents with  . Annual Exam    Part 2    History of Present Illness: HPI  The patient presents for  complete physical and review of chronic health problems. He/She also has the following acute concerns today: none  The patient saw Candis Musa, LPN for medicare wellness. Note reviewed in detail and important notes copied below. Health maintenance:  Foot exam - to be determined  Abnormal screenings:   Fall risk - hx of single fall Fall Risk  08/17/2018 05/26/2017 05/10/2016 05/16/2015 05/13/2014  Falls in the past year? 1 No No No No  Comment accidental fall in yard - - - -  Number falls in past yr: 0 - - - -  Injury with Fall? 1 - - - -     08/21/18   DM, well controlled followed by ENDO.  Lab Results  Component Value Date   HGBA1C 6.3 (H) 05/07/2018   Eye exam completed per pt report  Right total hip replacement 05/12/2018 Dr. Alvan Dame.  Exercise: back to walking  Easily, no pain.  OSA followed by Pulm.   Persistent afib followed by Dr. Rayann Heman Cardiology  On Eliquis anticoagulation.   Elevated Cholesterol:  LDL at goal < 100 on lipitor Lab Results  Component Value Date   CHOL 153 08/17/2018   HDL 43.80 08/17/2018   LDLCALC 91 08/17/2018   LDLDIRECT 163.1 07/04/2011   TRIG 92.0 08/17/2018   CHOLHDL 3 08/17/2018  Using medications without problems:none Muscle aches: none Diet compliance: eating well. Exercise: 3 times a week 30 min Other complaints:  Wt Readings from Last 3 Encounters:  08/21/18 159 lb 8 oz (72.3 kg)  05/13/18 160 lb 15 oz (73 kg)  05/07/18 162 lb 5 oz (73.6 kg)     Hypertension:    At goal on  cardiazem BP Readings from Last 3 Encounters:  08/21/18 106/60  07/28/18 119/63  05/13/18 (!) 160/66  Using medication without problems or lightheadedness: none Chest pain with exertion:none Edema:none Short of breath none Average home BPs: Other issues:  Depression, major , in remission: on no medications.     Clinical Support from 08/17/2018 in [place] [facility] Total Score  0       COVID 19 screen No recent travel or known exposure to COVID19 The patient denies respiratory symptoms of COVID 19 at this time.  The importance of social distancing was discussed today.   Review of Systems  Constitutional: Negative for chills and fever.  HENT: Negative for congestion and ear pain.   Eyes: Negative for pain and redness.  Respiratory: Negative for cough and shortness of breath.   Cardiovascular: Negative for chest pain, palpitations and leg swelling.  Gastrointestinal: Negative for abdominal pain, blood in stool, constipation, diarrhea, nausea and vomiting.  Genitourinary: Negative for dysuria.  Musculoskeletal: Negative for falls and myalgias.  Skin: Negative for rash.  Neurological: Negative for dizziness.  Psychiatric/Behavioral: Negative for depression. The patient is not nervous/anxious.       Past Medical History:  Diagnosis Date  . Anemia   . Anxiety   . Colon polyps   . Constipation   . Depressive disorder, not elsewhere classified   . Diaphragmatic hernia without mention of obstruction or gangrene   . Disorders of bursae and tendons in shoulder region, unspecified   . Diverticulosis of colon (without mention of hemorrhage)   . External hemorrhoid   . Fundic gland polyps of stomach, benign   .  GERD (gastroesophageal reflux disease)   . History of hiatal hernia   . HTN (hypertension)   . Hypothyroidism   . Internal hemorrhoids without mention of complication   . Moderate mitral regurgitation   . Obesity, unspecified   . OSA on CPAP    compliant with CPAP  . Other urinary incontinence   . Persistent atrial fibrillation   . Pure hypercholesterolemia   . Type II diabetes mellitus ([place])   . Urinary frequency   . Urinary urgency   . Vitamin B 12 deficiency     reports that she has never smoked. She has never used smokeless tobacco. She reports that  she does not drink alcohol or use drugs.   Current Outpatient Medications:  .  apixaban (ELIQUIS) 5 MG TABS tablet, Take 1 tablet (5 mg total) by mouth 2 (two) times daily., Disp: 60 tablet, Rfl: 5 .  atorvastatin (LIPITOR) 10 MG tablet, TAKE 1 TABLET BY MOUTH EVERYDAY AT BEDTIME, Disp: 90 tablet, Rfl: 0 .  Calcium Carb-Ergocalciferol 500-200 MG-UNIT TABS, Take 1 tablet by mouth daily. , Disp: , Rfl:  .  diltiazem (CARDIZEM CD) 120 MG 24 hr capsule, TAKE 1 CAPSULE BY MOUTH EVERY DAY, Disp: 90 capsule, Rfl: 2 .  escitalopram (LEXAPRO) 10 MG tablet, Take 1 tablet by mouth once daily, Disp: 90 tablet, Rfl: 0 .  ferrous sulfate 325 (65 FE) MG tablet, Take 1 tablet (325 mg total) by mouth 3 (three) times daily after meals. Then resume normal dose., Disp: 28 tablet, Rfl: 3 .  flecainide (TAMBOCOR) 50 MG tablet, Take 1 tablet (50 mg total) by mouth 2 (two) times daily. (Patient taking differently: Take 25 mg by mouth 2 (two) times daily. ), Disp: 180 tablet, Rfl: 2 .  glucose blood test strip, CHECK BLOOD SUGAR 2 TIMES A DAY AS DIRECTED DX CODE E11.9, Disp: 100 each, Rfl: 7 .  metFORMIN (GLUCOPHAGE-XR) 500 MG 24 hr tablet, TAKE 4 TABLETS BY MOUTH IN THE EVENING WITH DINNER (Patient taking differently: Take 1,000 mg by mouth 2 (two) times daily with a meal. ), Disp: 360 tablet, Rfl: 0 .  Multiple Vitamin (MULTIVITAMIN) tablet, Take 1 tablet by mouth daily.  , Disp: , Rfl:  .  sennosides-docusate sodium (SENOKOT-S) 8.6-50 MG tablet, Take 1 tablet by mouth daily as needed for constipation., Disp: , Rfl:  .  TOVIAZ 4 MG TB24 tablet, TAKE 1 TABLET BY MOUTH EVERY DAY, Disp: 90 tablet, Rfl: 0 .  TRADJENTA 5 MG TABS tablet, TAKE 1 TABLET BY MOUTH EVERY DAY, Disp: 30 tablet, Rfl: 1 .  traZODone (DESYREL) 50 MG tablet, TAKE 1-2 TABLETS (50-100 MG TOTAL) BY MOUTH AT BEDTIME AS NEEDED FOR SLEEP. (Patient taking differently: Take 50 mg by mouth at bedtime. ), Disp: 180 tablet, Rfl: 1   Observations/Objective: Blood  pressure 106/60, pulse 76, temperature 98.2 F (36.8 C), temperature source Oral, height 5' 4.75" (1.645 m), weight 159 lb 8 oz (72.3 kg), SpO2 98 %.  Physical Exam Exam conducted with a chaperone present.  Constitutional:      General: She is not in acute distress.    Appearance: Normal appearance. She is well-developed. She is not ill-appearing or toxic-appearing.  HENT:     Head: Normocephalic.     Right Ear: Hearing, tympanic membrane, ear canal and external ear normal.     Left Ear: Hearing, tympanic membrane, ear canal and external ear normal.     Nose: Nose normal.  Eyes:     General:  Lids are normal. Lids are everted, no foreign bodies appreciated.     Conjunctiva/sclera: Conjunctivae normal.     Pupils: Pupils are equal, round, and reactive to light.  Neck:     Musculoskeletal: Normal range of motion and neck supple.     Thyroid: No thyroid mass or thyromegaly.     Vascular: No carotid bruit.     Trachea: Trachea normal.  Cardiovascular:     Rate and Rhythm: Normal rate and regular rhythm.     Heart sounds: Normal heart sounds, S1 normal and S2 normal. No murmur. No gallop.   Pulmonary:     Effort: Pulmonary effort is normal. No respiratory distress.     Breath sounds: Normal breath sounds. No wheezing, rhonchi or rales.  Abdominal:     General: Bowel sounds are normal. There is no distension or abdominal bruit.     Palpations: Abdomen is soft. There is no fluid wave or mass.     Tenderness: There is no abdominal tenderness. There is no guarding or rebound.     Hernia: No hernia is present. There is no hernia in the left inguinal area or right inguinal area.  Genitourinary:    Exam position: Supine.     Pubic Area: No rash.      Labia:        Right: No rash.        Left: No rash.      Vagina: Normal.     Adnexa: Right adnexa normal and left adnexa normal.       Right: No mass, tenderness or fullness.         Left: No mass, tenderness or fullness.     Lymphadenopathy:     Cervical: No cervical adenopathy.     Lower Body: No right inguinal adenopathy. No left inguinal adenopathy.  Skin:    General: Skin is warm and dry.     Findings: No rash.  Neurological:     Mental Status: She is alert.     Cranial Nerves: No cranial nerve deficit.     Sensory: No sensory deficit.  Psychiatric:        Mood and Affect: Mood is not anxious or depressed.        Speech: Speech normal.        Behavior: Behavior normal. Behavior is cooperative.        Judgment: Judgment normal.      Diabetic foot exam: Normal inspection No skin breakdown No calluses  Normal DP pulses Normal sensation to light touch and monofilament Nails normal  Assessment and Plan   The patient's preventative maintenance and recommended screening tests for an annual wellness exam were reviewed in full today. Brought up to date unless services declined.  Counselled on the importance of diet, exercise, and its role in overall health and mortality. The patient's FH and SH was reviewed, including their home life, tobacco status, and drug and alcohol status.   Vaccines: Uptodate with PNA, Tdap, shingles, flu . Mammo: 07/13/2018 nml repeat in 1 year Colon: 09/2017, Dr. Carlean Purl, repeat in 10 years DEXA:06/2014 normal , repeat in 5 years Nonsmoker PAP/DVE: has ovaries, had partial hysterectomy. 8  Every 2 yearsas mother with cancer in ovary, no pap. Hep C : done  Essential hypertension Well controlled. Continue current medication.   Persistent atrial fibrillation (HCC) Stable followed by cardiology.  Controlled diabetes mellitus type II without complication (HCC) Well controlled , followed by Dr. Cruzita Lederer ENDO  Depression, major, in  remission ([place]) No issues. Neg PHQ9  Hypercholesterolemia Well controlled. Continue current medication.   Vitamin B 12 deficiency Resolved on supplement.  Vitamin D deficiency Resolved on supplementation.     Eliezer Lofts,  MD

## 2018-08-21 NOTE — Patient Instructions (Addendum)
Keep up healthy eating and regular exercise, increase as able.

## 2018-08-21 NOTE — Assessment & Plan Note (Signed)
Resolved on supplement 

## 2018-08-21 NOTE — Assessment & Plan Note (Signed)
Stable followed by cardiology

## 2018-08-26 ENCOUNTER — Other Ambulatory Visit: Payer: Self-pay | Admitting: Family Medicine

## 2018-09-14 ENCOUNTER — Telehealth: Payer: Self-pay | Admitting: Cardiovascular Disease

## 2018-09-14 NOTE — Progress Notes (Signed)
Cardiology Office Note Date:  09/15/2018  Patient ID:  [name], DOB 12-15-1945, MRN [id number] PCP:  Jinny Sanders, MD  Cardiologist:  Dr. Fletcher Anon, MD Electrophysiologist: Dr. Rayann Heman, MD    Chief Complaint: Palpitations  History of Present Illness: [name] is a 73 y.o. female with history of persistent A. fib on Eliquis status post cardioversion in 01/2016, 05/2016, and 11/2016 status post radiofrequency catheter ablation on 12/17/2016 followed by EP/A. fib clinic, pulmonary hypertension, mitral valve prolapse with mitral regurgitation, DM2, hypertension, hyperlipidemia, hypothyroidism, anemia, obesity, OSA on CPAP, diverticulosis, GERD, and anxiety who presents for follow-up of A. fib.  Prior cardiac cath in 12/2008 for atypical chest pain showed no significant CAD with mildly elevated LVEDP and normal EF.  She was initially diagnosed with A. fib in 09/2013 and was quite symptomatic with this.  She was initially started on metoprolol for rate control.  Echo at that time showed normal LV systolic function with mild mitral regurgitation, moderately dilated left atrium, and mild to moderate pulmonary hypertension.  Given it was felt she would likely need antiarrhythmic therapy due to symptoms nuclear stress test in 09/2013 was undertaken and was normal.  With recurrence of A. fib, she was subsequently placed on flecainide.  And underwent cardioversion in 01/2016.  She had recurrent A. fib in 05/2016 and underwent repeat successful cardioversion at that time.  She has subsequently been referred to the A. fib clinic and managed by them.  She had recurrent A. fib in 11/2016 and underwent a third cardioversion at that time.  She subsequently underwent radiofrequency catheter ablation on 12/17/2016.  Since then, she has been exclusively managed by EP/A. fib clinic.  She was most recently seen by EP on 04/13/2018 and was doing well, maintaining sinus rhythm.  She subsequently underwent total right hip  arthroplasty in 05/2018.  Patient contacted our office on 09/14/2018 noting a several day history of palpitations with heart rates ranging in the 120s to 140s beats per minute.  Patient comes in noting a 4 to 5-day history of mild palpitations that feel different than her prior episodes of A. fib.  There was also a brief episode of dizziness while at [facility] on 7/10 and 7/11 lasting for several seconds with spontaneous resolution.  There were no associated palpitations with this dizziness.  She is also noted headache for the same duration and typically does not get headaches.  She reports compliance with medications including anticoagulation.  She denies any chest pain, shortness of breath, syncope, lower extremity swelling, abdominal fullness, orthopnea, PND, early satiety.  Currently she feels well.  Labs: 08/2018 - WBC 6.0, Hgb 11.8, PLT 285, potassium 4.8, serum creatinine 0.84, AST/ALT normal, albumin 4.2, LDL 91 05/2018 - A1c 6.3 11/2016 - TSH normal  Past Medical History:  Diagnosis Date   Anemia    Anxiety    Colon polyps    Constipation    Depressive disorder, not elsewhere classified    Diaphragmatic hernia without mention of obstruction or gangrene    Disorders of bursae and tendons in shoulder region, unspecified    Diverticulosis of colon (without mention of hemorrhage)    External hemorrhoid    Fundic gland polyps of stomach, benign    GERD (gastroesophageal reflux disease)    History of hiatal hernia    HTN (hypertension)    Hypothyroidism    Internal hemorrhoids without mention of complication    Moderate mitral regurgitation    Obesity, unspecified  OSA on CPAP    compliant with CPAP   Other urinary incontinence    Persistent atrial fibrillation    Pure hypercholesterolemia    Type II diabetes mellitus ([place])    Urinary frequency    Urinary urgency    Vitamin B 12 deficiency     Past Surgical History:  Procedure Laterality Date    ABDOMINAL HYSTERECTOMY  1972   Have both ovaries   ATRIAL FIBRILLATION ABLATION  12/17/2016   ATRIAL FIBRILLATION ABLATION N/A 12/17/2016   Procedure: ATRIAL FIBRILLATION ABLATION;  Surgeon: Thompson Grayer, MD;  Location: [place] [facility];  Service: Cardiovascular;  Laterality: N/A;   [place]   [place]; "Dr. [name]"   CARDIOVERSION N/A 05/27/2016   Procedure: CARDIOVERSION;  Surgeon: [name], MD;  Location: [facility];  Service: Cardiovascular;  Laterality: N/A;   CARDIOVERSION N/A 11/25/2016   Procedure: CARDIOVERSION;  Surgeon: [name], MD;  Location: [facility];  Service: Cardiovascular;  Laterality: N/A;   CATARACT EXTRACTION W/ INTRAOCULAR LENS  IMPLANT, BILATERAL Bilateral    COLONOSCOPY     ELECTROPHYSIOLOGIC STUDY N/A 01/24/2016   Procedure: CARDIOVERSION;  Surgeon: [name], MD;  Location: [facility];  Service: Cardiovascular;  Laterality: N/A;   LAPAROSCOPIC CHOLECYSTECTOMY     REDUCTION MAMMAPLASTY Bilateral    TONSILLECTOMY     TOTAL HIP ARTHROPLASTY Right 05/12/2018   Procedure: TOTAL HIP ARTHROPLASTY ANTERIOR APPROACH;  Surgeon: Paralee Cancel, MD;  Location: [facility];  Service: Orthopedics;  Laterality: Right;  70 mins    Current Meds  Medication Sig   apixaban (ELIQUIS) 5 MG TABS tablet Take 1 tablet (5 mg total) by mouth 2 (two) times daily.   atorvastatin (LIPITOR) 10 MG tablet TAKE 1 TABLET BY MOUTH EVERYDAY AT BEDTIME   Calcium Carb-Ergocalciferol 500-200 MG-UNIT TABS Take 1 tablet by mouth daily.    diltiazem (CARDIZEM CD) 120 MG 24 hr capsule TAKE 1 CAPSULE BY MOUTH EVERY DAY   escitalopram (LEXAPRO) 10 MG tablet Take 1 tablet by mouth once daily   ferrous sulfate 325 (65 FE) MG tablet Take 1 tablet (325 mg total) by mouth 3 (three) times daily after meals. Then resume normal dose.   flecainide (TAMBOCOR) 50 MG tablet Take 1 tablet (50 mg total) by mouth 2 (two) times daily. (Patient taking differently: Take 25  mg by mouth 2 (two) times daily. )   glucose blood test strip CHECK BLOOD SUGAR 2 TIMES A DAY AS DIRECTED DX CODE E11.9   metFORMIN (GLUCOPHAGE-XR) 500 MG 24 hr tablet TAKE 4 TABLETS BY MOUTH IN THE EVENING WITH DINNER (Patient taking differently: Take 1,000 mg by mouth 2 (two) times daily with a meal. )   Multiple Vitamin (MULTIVITAMIN) tablet Take 1 tablet by mouth daily.     sennosides-docusate sodium (SENOKOT-S) 8.6-50 MG tablet Take 1 tablet by mouth daily as needed for constipation.   TOVIAZ 4 MG TB24 tablet TAKE 1 TABLET BY MOUTH EVERY DAY   TRADJENTA 5 MG TABS tablet TAKE 1 TABLET BY MOUTH EVERY DAY   traZODone (DESYREL) 50 MG tablet TAKE 1-2 TABLETS (50-100 MG TOTAL) BY MOUTH AT BEDTIME AS NEEDED FOR SLEEP. (Patient taking differently: Take 50 mg by mouth at bedtime. )    Allergies:   Pravachol [pravastatin sodium] and Contrast media [iodinated diagnostic agents]   Social History:  The patient  reports that she has never smoked. She has never used smokeless tobacco. She reports that she does not drink alcohol  or use drugs.   Family History:  The patient's family history includes Cancer in her mother; Dementia in her mother; Diabetes in her brother and mother; Hyperlipidemia in her father and mother; Hypertension in her sister; Lung cancer in her father; Pulmonary embolism in her mother; Stroke in her paternal grandfather and paternal grandmother.  ROS:   Review of Systems  Constitutional: Positive for malaise/fatigue. Negative for chills, diaphoresis, fever and weight loss.  HENT: Negative for congestion.   Eyes: Negative for discharge and redness.  Respiratory: Negative for cough, hemoptysis, sputum production, shortness of breath and wheezing.   Cardiovascular: Positive for palpitations. Negative for chest pain, orthopnea, claudication, leg swelling and PND.  Gastrointestinal: Negative for abdominal pain, blood in stool, heartburn, melena, nausea and vomiting.    Genitourinary: Negative for hematuria.  Musculoskeletal: Negative for falls and myalgias.  Skin: Negative for rash.  Neurological: Positive for dizziness, weakness and headaches. Negative for tingling, tremors, sensory change, speech change, focal weakness and loss of consciousness.  Endo/Heme/Allergies: Does not bruise/bleed easily.  Psychiatric/Behavioral: Negative for substance abuse. The patient is not nervous/anxious.   All other systems reviewed and are negative.    PHYSICAL EXAM:  VS:  BP 118/64 (BP Location: Left Arm, Patient Position: Sitting, Cuff Size: Normal)    Pulse (!) 107    Temp (!) 97.4 F (36.3 C)    Ht 5\' 4"  (1.626 m)    Wt 160 lb 8 oz (72.8 kg)    LMP  (LMP Unknown)    SpO2 98%    BMI 27.55 kg/m  BMI: Body mass index is 27.55 kg/m.  Physical Exam  Constitutional: She is oriented to person, place, and time. She appears well-developed and well-nourished.  HENT:  Head: Normocephalic and atraumatic.  Eyes: Right eye exhibits no discharge. Left eye exhibits no discharge.  Neck: Normal range of motion. No JVD present.  Cardiovascular: Normal rate, S1 normal, S2 normal and normal heart sounds. An irregular rhythm present. Exam reveals no distant heart sounds, no friction rub, no midsystolic click and no opening snap.  No murmur heard. Pulses:      Dorsalis pedis pulses are 2+ on the right side and 2+ on the left side.       Posterior tibial pulses are 2+ on the right side and 2+ on the left side.  Pulmonary/Chest: Effort normal and breath sounds normal. No respiratory distress. She has no decreased breath sounds. She has no wheezes. She has no rales. She exhibits no tenderness.  Abdominal: Soft. She exhibits no distension. There is no abdominal tenderness.  Musculoskeletal:        General: No edema.  Neurological: She is alert and oriented to person, place, and time.  Skin: Skin is warm and dry. No cyanosis. Nails show no clubbing.  Psychiatric: She has a normal mood  and affect. Her speech is normal and behavior is normal. Judgment and thought content normal.     EKG:  Was ordered and interpreted by me today. Shows atrial tachycardia with long cycle week euvolemic, 107 bpm, no acute ST-T changes, reviewed EKG with Dr. Caryl Comes who provided the above read  Recent Labs: 08/17/2018: ALT 9; BUN 13; Creatinine, Ser 0.84; Hemoglobin 11.8; Platelets 285.0; Potassium 4.8; Sodium 135  08/17/2018: Cholesterol 153; HDL 43.80; LDL Cholesterol 91; Total CHOL/HDL Ratio 3; Triglycerides 92.0; VLDL 18.4   CrCl cannot be calculated (Patient's most recent lab result is older than the maximum 21 days allowed.).   Wt Readings from Last  3 Encounters:  09/15/18 160 lb 8 oz (72.8 kg)  08/21/18 159 lb 8 oz (72.3 kg)  05/13/18 160 lb 15 oz (73 kg)     Other studies reviewed: Additional studies/records reviewed today include: summarized above  ASSESSMENT AND PLAN:  1. Atrial tachycardia: Currently asymptomatic.  Case was reviewed extensively with Dr. Caryl Comes.  EP has recommended we increase flecainide back to 50 mg twice daily and schedule the patient for a cardioversion this week.  She will be COVID-19 tested today with scheduled cardioversion on Friday, 09/18/2018.  Continue current dose of Cardizem CD 120 mg daily as well as Eliquis 5 mg twice daily.  She denies missing any doses of anticoagulation.  Check CBC, BMP, TSH, and magnesium.  She will need to follow-up with EP next week.  2. Persistent A. fib: Status post multiple cardioversions and catheter ablation as outlined per the HPI.  Continue current treatment as per #1.  3. Pulmonary hypertension: Appears stable.  Not currently requiring standing diuretic.  Follow-up with general cardiology/pulmonary as directed.  4. MVP with mitral regurgitation: Asymptomatic.  Schedule echo at next appointment.  5. Hypertension: Blood pressure is well controlled.  Continue current medication.  6. Hyperlipidemia: Most recent LDL of 91  from 6-20 20.  Continue current dose of Lipitor.  Disposition: F/u with Dr. Rayann Heman in 1 week.  Current medicines are reviewed at length with the patient today.  The patient did not have any concerns regarding medicines.  Signed, Christell Faith, PA-C 09/15/2018 9:13 AM     [address], [place] [address] [phone]

## 2018-09-14 NOTE — Telephone Encounter (Signed)
Patient c/o Palpitations:  High priority if patient c/o lightheadedness, shortness of breath, or chest pain  1) How long have you had palpitations/irregular HR/ Afib? Are you having the symptoms now? 3 days no symptoms   2) Are you currently experiencing lightheadedness, SOB or CP? Denies any symptoms   3) Do you have a history of afib (atrial fibrillation) or irregular heart rhythm?  yes  4) Have you checked your BP or HR? (document readings if available):  103/68 hr 125 HR trend is up to 140's   5) Are you experiencing any other symptoms? No        COVID-19 Pre-Screening Questions:   In the past 7 to 10 days have you had a cough,  shortness of breath, headache, congestion, fever (100 or greater) body aches, chills, sore throat, or sudden loss of taste or sense of smell? NO  Have you been around anyone with known Covid 19. NO  Have you been around anyone who is awaiting Covid 19 test results in the past 7 to 10 days? NO  Have you been around anyone who has been exposed to Covid 19, or has mentioned symptoms of Covid 19 within the past 7 to 10 days? NO  If you have any concerns/questions about symptoms patients report during screening (either on the phone or at threshold). Contact the provider seeing the patient or DOD for further guidance.  If neither are available contact a member of the leadership team.

## 2018-09-14 NOTE — Telephone Encounter (Signed)
Spoke with the pt. Pt sts that she is asymptomatic. Her flecainide was reduced from 50mg  bid to a 1/2 tab twice daily. Pt sts that she took a whole tab 50mg  last night and this morning. Pt sts that her HR prior to her medication 145bpm. Pt sts that currently her HR is 120bpm. Adv the pt to keep her appt tomorrow and to contact the office in the interim if symptoms develop. Pt agreeable with the plan and voiced appreciation for the call.

## 2018-09-15 ENCOUNTER — Other Ambulatory Visit: Payer: Self-pay

## 2018-09-15 ENCOUNTER — Other Ambulatory Visit
Admission: RE | Admit: 2018-09-15 | Discharge: 2018-09-15 | Disposition: A | Discharge status: Home / Self Care | Payer: Medicare HMO | Source: Ambulatory Visit | Attending: Physician Assistant | Admitting: Physician Assistant

## 2018-09-15 ENCOUNTER — Encounter: Payer: Self-pay | Admitting: Physician Assistant

## 2018-09-15 ENCOUNTER — Ambulatory Visit (INDEPENDENT_AMBULATORY_CARE_PROVIDER_SITE_OTHER): Payer: Medicare HMO | Admitting: Physician Assistant

## 2018-09-15 VITALS — BP 118/64 | HR 107 | Temp 97.4°F | Ht 64.0 in | Wt 160.5 lb

## 2018-09-15 DIAGNOSIS — I4819 Other persistent atrial fibrillation: Secondary | ICD-10-CM

## 2018-09-15 DIAGNOSIS — I471 Supraventricular tachycardia: Secondary | ICD-10-CM

## 2018-09-15 DIAGNOSIS — Z01812 Encounter for preprocedural laboratory examination: Secondary | ICD-10-CM | POA: Diagnosis not present

## 2018-09-15 DIAGNOSIS — I340 Nonrheumatic mitral (valve) insufficiency: Secondary | ICD-10-CM

## 2018-09-15 DIAGNOSIS — I10 Essential (primary) hypertension: Secondary | ICD-10-CM | POA: Diagnosis not present

## 2018-09-15 DIAGNOSIS — Z1159 Encounter for screening for other viral diseases: Secondary | ICD-10-CM | POA: Insufficient documentation

## 2018-09-15 DIAGNOSIS — E782 Mixed hyperlipidemia: Secondary | ICD-10-CM

## 2018-09-15 MED ORDER — FLECAINIDE ACETATE 50 MG PO TABS: 50.0000 mg | ORAL_TABLET | Freq: Two times a day (BID) | ORAL | 3 refills | Status: DC

## 2018-09-15 NOTE — Patient Instructions (Addendum)
Medication Instructions:  INCREASE: Flecainide to 50 mg twice a day   If you need a refill on your cardiac medications before your next appointment, please call your pharmacy.   Lab work: TODAY: BMET, CBC & MAG  If you have labs (blood work) drawn today and your tests are completely normal, you will receive your results only by: [name] MyChart Message (if you have MyChart) OR . A paper copy in the mail If you have any lab test that is abnormal or we need to change your treatment, we will call you to review the results.  Testing/Procedures: Please go to first floor to have COVID test done today. Then you will need to go home and quarantine until your procedure on Friday.   You are scheduled for a Cardioversion on Friday July 17th with Dr. Rockey Situ. Please arrive at the [place] [facility] at 07:00 a.m. on the day of your procedure.  DIET INSTRUCTIONS:  Nothing to eat or drink after midnight except your medications with a small sip of water.        1) Medications:  YOU MAY TAKE ALL of your remaining medications with a small amount of water. IT IS VERY IMPORTANT THAT YOU TAKE YOUR ELIQUIS THE MORNING OF YOUR PROCEDURE   2) Must have a responsible person to drive you home.  3) Bring a current list of your medications and current insurance cards.    If you have any questions after you get home, please call the office at [phone]   Follow-Up: At [facility], you and your health needs are our priority.  As part of our continuing mission to provide you with exceptional heart care, we have created designated Provider Care Teams.  These Care Teams include your primary Cardiologist (physician) and Advanced Practice Providers (APPs -  Physician Assistants and Nurse Practitioners) who all work together to provide you with the care you need, when you need it. You will need a follow up appointment in 1 weeks.  Please call our office 2 months in advance to schedule this appointment.  You may see Dr.  Rayann Heman or one of the following Advanced Practice Providers on your designated Care Team:   Chanetta Marshall, NP . Tommye Standard, PA-C  Any Other Special Instructions Will Be Listed Below (If Applicable).   Electrical Cardioversion  Electrical cardioversion is the delivery of a jolt of electricity to restore a normal rhythm to the heart. A rhythm that is too fast or is not regular keeps the heart from pumping well. In this procedure, sticky patches or metal paddles are placed on the chest to deliver electricity to the heart from a device. This procedure may be done in an emergency if:  There is low or no blood pressure as a result of the heart rhythm.  Normal rhythm must be restored as fast as possible to protect the brain and heart from further damage.  It may save a life. This procedure may also be done for irregular or fast heart rhythms that are not immediately life-threatening. Tell a health care provider about:  Any allergies you have.  All medicines you are taking, including vitamins, herbs, eye drops, creams, and over-the-counter medicines.  Any problems you or family members have had with anesthetic medicines.  Any blood disorders you have.  Any surgeries you have had.  Any medical conditions you have.  Whether you are pregnant or may be pregnant. What are the risks? Generally, this is a safe procedure. However, problems may  occur, including:  Allergic reactions to medicines.  A blood clot that breaks free and travels to other parts of your body.  The possible return of an abnormal heart rhythm within hours or days after the procedure.  Your heart stopping (cardiac arrest). This is rare. What happens before the procedure? Medicines  Your health care provider may have you start taking: ? Blood-thinning medicines (anticoagulants) so your blood does not clot as easily. ? Medicines may be given to help stabilize your heart rate and rhythm.  Ask your health care  provider about changing or stopping your regular medicines. This is especially important if you are taking diabetes medicines or blood thinners. General instructions  Plan to have someone take you home from the hospital or clinic.  If you will be going home right after the procedure, plan to have someone with you for 24 hours.  Follow instructions from your health care provider about eating or drinking restrictions. What happens during the procedure?  To lower your risk of infection: ? Your health care team will wash or sanitize their hands. ? Your skin will be washed with soap.  An IV tube will be inserted into one of your veins.  You will be given a medicine to help you relax (sedative).  Sticky patches (electrodes) or metal paddles may be placed on your chest.  An electrical shock will be delivered. The procedure may vary among health care providers and hospitals. What happens after the procedure?   Your blood pressure, heart rate, breathing rate, and blood oxygen level will be monitored until the medicines you were given have worn off.  Do not drive for 24 hours if you were given a sedative.  Your heart rhythm will be watched to make sure it does not change. This information is not intended to replace advice given to you by your health care provider. Make sure you discuss any questions you have with your health care provider. Document Released: 02/08/2002 Document Revised: 01/31/2017 Document Reviewed: 08/25/2015 Elsevier Patient Education  2020 [employer].

## 2018-09-16 LAB — BASIC METABOLIC PANEL
BUN/Creatinine Ratio: 15 (ref 12–28)
BUN: 13 mg/dL (ref 8–27)
CO2: 23 mmol/L (ref 20–29)
Calcium: 10 mg/dL (ref 8.7–10.3)
Chloride: 98 mmol/L (ref 96–106)
Creatinine, Ser: 0.84 mg/dL (ref 0.57–1.00)
GFR calc Af Amer: 81 mL/min/{1.73_m2} (ref >59)
GFR calc non Af Amer: 70 mL/min/{1.73_m2} (ref >59)
Glucose: 128 mg/dL — ABNORMAL HIGH (ref 65–99)
Potassium: 4.7 mmol/L (ref 3.5–5.2)
Sodium: 137 mmol/L (ref 134–144)

## 2018-09-16 LAB — CBC
Hematocrit: 36.5 % (ref 34.0–46.6)
Hemoglobin: 11.9 g/dL (ref 11.1–15.9)
MCH: 29.2 pg (ref 26.6–33.0)
MCHC: 32.6 g/dL (ref 31.5–35.7)
MCV: 90 fL (ref 79–97)
Platelets: 294 10*3/uL (ref 150–450)
RBC: 4.08 x10E6/uL (ref 3.77–5.28)
RDW: 13.3 % (ref 11.7–15.4)
WBC: 5.3 10*3/uL (ref 3.4–10.8)

## 2018-09-16 LAB — MAGNESIUM: Magnesium: 1.9 mg/dL (ref 1.6–2.3)

## 2018-09-16 LAB — SARS CORONAVIRUS 2 (TAT 6-24 HRS): SARS Coronavirus 2: NEGATIVE

## 2018-09-18 ENCOUNTER — Other Ambulatory Visit: Payer: Self-pay

## 2018-09-18 ENCOUNTER — Ambulatory Visit
Admission: RE | Admit: 2018-09-18 | Discharge: 2018-09-18 | Disposition: A | Discharge status: Home / Self Care | Payer: Medicare HMO | Attending: Cardiovascular Disease | Admitting: Cardiovascular Disease

## 2018-09-18 ENCOUNTER — Ambulatory Visit: Payer: Medicare HMO | Admitting: Anesthesiology

## 2018-09-18 ENCOUNTER — Encounter
Admission: RE | Disposition: A | Discharge status: Home / Self Care | Payer: Self-pay | Source: Home / Self Care | Attending: Cardiovascular Disease

## 2018-09-18 DIAGNOSIS — Z7984 Long term (current) use of oral hypoglycemic drugs: Secondary | ICD-10-CM | POA: Diagnosis not present

## 2018-09-18 DIAGNOSIS — Z91041 Radiographic dye allergy status: Secondary | ICD-10-CM | POA: Diagnosis not present

## 2018-09-18 DIAGNOSIS — Z79899 Other long term (current) drug therapy: Secondary | ICD-10-CM | POA: Diagnosis not present

## 2018-09-18 DIAGNOSIS — E039 Hypothyroidism, unspecified: Secondary | ICD-10-CM | POA: Diagnosis not present

## 2018-09-18 DIAGNOSIS — G4733 Obstructive sleep apnea (adult) (pediatric): Secondary | ICD-10-CM | POA: Diagnosis not present

## 2018-09-18 DIAGNOSIS — Z888 Allergy status to other drugs, medicaments and biological substances status: Secondary | ICD-10-CM | POA: Diagnosis not present

## 2018-09-18 DIAGNOSIS — E669 Obesity, unspecified: Secondary | ICD-10-CM | POA: Insufficient documentation

## 2018-09-18 DIAGNOSIS — Z90710 Acquired absence of both cervix and uterus: Secondary | ICD-10-CM | POA: Diagnosis not present

## 2018-09-18 DIAGNOSIS — R0602 Shortness of breath: Secondary | ICD-10-CM

## 2018-09-18 DIAGNOSIS — Z20828 Contact with and (suspected) exposure to other viral communicable diseases: Secondary | ICD-10-CM | POA: Insufficient documentation

## 2018-09-18 DIAGNOSIS — K219 Gastro-esophageal reflux disease without esophagitis: Secondary | ICD-10-CM | POA: Insufficient documentation

## 2018-09-18 DIAGNOSIS — I471 Supraventricular tachycardia: Secondary | ICD-10-CM | POA: Diagnosis not present

## 2018-09-18 DIAGNOSIS — Z6827 Body mass index (BMI) 27.0-27.9, adult: Secondary | ICD-10-CM | POA: Diagnosis not present

## 2018-09-18 DIAGNOSIS — I10 Essential (primary) hypertension: Secondary | ICD-10-CM | POA: Diagnosis not present

## 2018-09-18 DIAGNOSIS — Z96641 Presence of right artificial hip joint: Secondary | ICD-10-CM | POA: Insufficient documentation

## 2018-09-18 DIAGNOSIS — I4719 Other supraventricular tachycardia: Secondary | ICD-10-CM | POA: Insufficient documentation

## 2018-09-18 DIAGNOSIS — Z7901 Long term (current) use of anticoagulants: Secondary | ICD-10-CM | POA: Diagnosis not present

## 2018-09-18 DIAGNOSIS — E119 Type 2 diabetes mellitus without complications: Secondary | ICD-10-CM | POA: Diagnosis not present

## 2018-09-18 HISTORY — PX: CARDIOVERSION: SHX1299

## 2018-09-18 LAB — GLUCOSE, CAPILLARY: Glucose-Capillary: 100 mg/dL — ABNORMAL HIGH (ref 70–99)

## 2018-09-18 SURGERY — CARDIOVERSION
Anesthesia: General

## 2018-09-18 MED ORDER — SODIUM CHLORIDE 0.9 % IV SOLN
INTRAVENOUS | Status: DC | PRN
  Administered 2018-09-18: 08:00:00 via INTRAVENOUS

## 2018-09-18 MED ORDER — PROPOFOL 10 MG/ML IV BOLUS
INTRAVENOUS | Status: DC | PRN
  Administered 2018-09-18: 50 mg via INTRAVENOUS

## 2018-09-18 NOTE — Discharge Instructions (Signed)

## 2018-09-18 NOTE — Anesthesia Post-op Follow-up Note (Signed)
Anesthesia QCDR form completed.        

## 2018-09-18 NOTE — Anesthesia Postprocedure Evaluation (Signed)
Anesthesia Post Note  Patient: [name]  Procedure(s) Performed: CARDIOVERSION (N/A )  Patient location during evaluation: Cath Lab Anesthesia Type: General Level of consciousness: awake and alert Pain management: pain level controlled Vital Signs Assessment: post-procedure vital signs reviewed and stable Respiratory status: spontaneous breathing, nonlabored ventilation, respiratory function stable and patient connected to nasal cannula oxygen Cardiovascular status: blood pressure returned to baseline and stable Postop Assessment: no apparent nausea or vomiting Anesthetic complications: no     Last Vitals:  Vitals:   09/18/18 0821 09/18/18 0833  BP: (!) 90/59 109/69  Pulse: 67 66  Resp: 20 13  SpO2: 100% 96%    Last Pain:  Vitals:   09/18/18 0833  PainSc: 0-No pain                 Precious Haws Jesi Jurgens

## 2018-09-18 NOTE — Anesthesia Preprocedure Evaluation (Signed)
Anesthesia Evaluation  Patient identified by MRN, date of birth, ID band Patient awake    Reviewed: Allergy & Precautions, H&P , NPO status , Patient's Chart, lab work & pertinent test results  History of Anesthesia Complications Negative for: history of anesthetic complications  Airway Mallampati: III  TM Distance: <3 FB Neck ROM: limited    Dental  (+) Chipped   Pulmonary neg shortness of breath, sleep apnea and Continuous Positive Airway Pressure Ventilation ,           Cardiovascular Exercise Tolerance: Good hypertension, (-) angina(-) Past MI and (-) DOE      Neuro/Psych PSYCHIATRIC DISORDERS  Neuromuscular disease    GI/Hepatic Neg liver ROS, hiatal hernia, GERD  Medicated and Controlled,  Endo/Other  diabetes, Type 2Hypothyroidism   Renal/GU negative Renal ROS  negative genitourinary   Musculoskeletal   Abdominal   Peds  Hematology negative hematology ROS (+)   Anesthesia Other Findings Past Medical History: No date: Anemia No date: Anxiety No date: Colon polyps No date: Constipation No date: Depressive disorder, not elsewhere classified No date: Diaphragmatic hernia without mention of obstruction or  gangrene No date: Disorders of bursae and tendons in shoulder region,  unspecified No date: Diverticulosis of colon (without mention of hemorrhage) No date: External hemorrhoid No date: Fundic gland polyps of stomach, benign No date: GERD (gastroesophageal reflux disease) No date: History of hiatal hernia No date: HTN (hypertension) No date: Hypothyroidism No date: Internal hemorrhoids without mention of complication No date: Moderate mitral regurgitation No date: Obesity, unspecified No date: OSA on CPAP     Comment:  compliant with CPAP No date: Other urinary incontinence No date: Persistent atrial fibrillation No date: Pure hypercholesterolemia No date: Type II diabetes mellitus (HCC) No date:  Urinary frequency No date: Urinary urgency No date: Vitamin B 12 deficiency  Past Surgical History: 1972: ABDOMINAL HYSTERECTOMY     Comment:  Have both ovaries 12/17/2016: ATRIAL FIBRILLATION ABLATION 12/17/2016: ATRIAL FIBRILLATION ABLATION; N/A     Comment:  Procedure: ATRIAL FIBRILLATION ABLATION;  Surgeon:               Allred, James, MD;  [facility]: [facility];                Service: Cardiovascular;  Laterality: N/A; 1990s: CARDIAC CATHETERIZATION     Comment:  [facility]; "Dr. Little" 05/27/2016: CARDIOVERSION; N/A     Comment:  Procedure: CARDIOVERSION;  Surgeon: Muhammad A Arida,               MD;  Location: [facility];  Service: Cardiovascular;                Laterality: N/A; 11/25/2016: CARDIOVERSION; N/A     Comment:  Procedure: CARDIOVERSION;  Surgeon: Arida, Muhammad A,               MD;  Location: [facility];  Service: Cardiovascular;                Laterality: N/A; No date: CATARACT EXTRACTION W/ INTRAOCULAR LENS  IMPLANT, BILATERAL;  Bilateral No date: COLONOSCOPY 01/24/2016: ELECTROPHYSIOLOGIC STUDY; N/A     Comment:  Procedure: CARDIOVERSION;  Surgeon: Timothy J Gollan,               MD;  Location: [facility];  Service: Cardiovascular;                Laterality: N/A; No date: LAPAROSCOPIC CHOLECYSTECTOMY No date: REDUCTION MAMMAPLASTY; Bilateral No date: TONSILLECTOMY   05/12/2018: TOTAL HIP ARTHROPLASTY; Right     Comment:  Procedure: TOTAL HIP ARTHROPLASTY ANTERIOR APPROACH;                Surgeon: Olin, Matthew, MD;  Location: [facility];  Service:               Orthopedics;  Laterality: Right;  70 mins     Reproductive/Obstetrics negative OB ROS                             Anesthesia Physical  Anesthesia Plan  ASA: III  Anesthesia Plan: General   Post-op Pain Management:    Induction: Intravenous  PONV Risk Score and Plan: Propofol infusion and TIVA  Airway Management Planned: Natural Airway and Nasal Cannula  Additional  Equipment:   Intra-op Plan:   Post-operative Plan:   Informed Consent: I have reviewed the patients History and Physical, chart, labs and discussed the procedure including the risks, benefits and alternatives for the proposed anesthesia with the patient or authorized representative who has indicated his/her understanding and acceptance.     Dental Advisory Given  Plan Discussed with: Anesthesiologist, CRNA and Surgeon  Anesthesia Plan Comments: (Patient consented for risks of anesthesia including but not limited to:  - adverse reactions to medications - risk of intubation if required - damage to teeth, lips or other oral mucosa - sore throat or hoarseness - Damage to heart, brain, lungs or loss of life  Patient voiced understanding.)        Anesthesia Quick Evaluation  

## 2018-09-18 NOTE — Transfer of Care (Signed)
Immediate Anesthesia Transfer of Care Note  Patient: [name]  Procedure(s) Performed: CARDIOVERSION (N/A )  Patient Location: Cath Lab  Anesthesia Type:MAC  Level of Consciousness: awake, alert  and oriented  Airway & Oxygen Therapy: Patient Spontanous Breathing and Patient connected to nasal cannula oxygen  Post-op Assessment: Report given to RN and Post -op Vital signs reviewed and stable  Post vital signs: Reviewed and stable  Last Vitals:  Vitals Value Taken Time  BP 101/68 09/18/18 0754  Temp    Pulse 68 09/18/18 0757  Resp 19 09/18/18 0757  SpO2 94 % 09/18/18 0757    Last Pain: There were no vitals filed for this visit.       Complications: No apparent anesthesia complications

## 2018-09-18 NOTE — CV Procedure (Signed)
Cardioversion procedure note For atrial tachycardia  Procedure Details:  Consent: Risks of procedure as well as the alternatives and risks of each were explained to the (patient/caregiver). Consent for procedure obtained.  Time Out: Verified patient identification, verified procedure, site/side was marked, verified correct patient position, special equipment/implants available, medications/allergies/relevent history reviewed, required imaging and test results available. Performed  Patient placed on cardiac monitor, pulse oximetry, supplemental oxygen as necessary.  Sedation given: propofol IV, Dr. Amie Critchley Pacer pads placed anterior and posterior chest.   Cardioverted 1 time(s).  Cardioverted at  150 J. Synchronized biphasic Converted to NSR   Evaluation: Findings: Post procedure EKG shows: NSR Complications: None Patient did tolerate procedure well.  Time Spent Directly with the Patient:  19 minutes   Esmond Plants, M.D., Ph.D.

## 2018-09-19 NOTE — Progress Notes (Signed)
Cardiology Office Note Date:  09/24/2018  Patient ID:  [name] 1946/02/27, MRN [id number] PCP:  Emily Sanders, MD  Cardiologist:  Dr. Fletcher Anon, MD Electrophysiologist: Dr. Rayann Heman, MD     Chief Complaint: Follow up  History of Present Illness: [name] is a 73 y.o. female with history of persistent A. fib on Eliquis status post cardioversion in 01/2016, 05/2016, and 11/2016 status post radiofrequency catheter ablation on 12/17/2016 followed by EP/A. fib clinic, atrial tachycardia s/p DCCV 09/18/2018, pulmonary hypertension, mitral valve prolapse with mitral regurgitation, DM2, hypertension, hyperlipidemia, hypothyroidism, anemia, obesity, OSA on CPAP, diverticulosis, GERD, and anxiety who presents for follow-up of recent cardioversion.  Prior cardiac cath in 12/2008 for atypical chest pain showed no significant CAD with mildly elevated LVEDP and normal EF.  [name] was initially diagnosed with A. fib in 09/2013 and was quite symptomatic with this.  [name] was initially started on metoprolol for rate control.  Echo at that time showed normal LV systolic function with mild mitral regurgitation, moderately dilated left atrium, and mild to moderate pulmonary hypertension.  Given it was felt [name] would likely need antiarrhythmic therapy due to symptoms nuclear stress test in 09/2013 was undertaken and was normal.  With recurrence of A. fib, [name] was subsequently placed on flecainide.  And underwent cardioversion in 01/2016.  [name] had recurrent A. fib in 05/2016 and underwent repeat successful cardioversion at that time.  [name] has subsequently been referred to the A. fib clinic and managed by them.  [name] had recurrent A. fib in 11/2016 and underwent a third cardioversion at that time.  [name] subsequently underwent radiofrequency catheter ablation on 12/17/2016.  Since then, [name] has been exclusively managed by EP/A. fib clinic.  [name] was most recently seen by EP on 04/13/2018 and was doing well, maintaining sinus  rhythm.  [name] subsequently underwent total right hip arthroplasty in 05/2018.    [name] was seen on 09/15/2018 noting a 4 to 5-day history of mild palpitations that felt different than her prior episodes of A. fib.  There was also a brief episode of dizziness while at [facility] on 7/10 and 7/11 lasting for several seconds with spontaneous resolution.  There were no associated palpitations with this dizziness.  [name] is also noted headache for the same duration and typically does not get headaches.  [name] reported compliance with medications including anticoagulation.  After review with EP, [name] was noted to be in atrial tachycardia with long cycle Mobitz type I AV block. Her flecainide was increased to 50 mg bid and [name] underwent successful DCCV on 09/18/2018.  [name] comes in feeling well today.  No chest pain, shortness of breath, palpitations, dizziness, presyncope, syncope.  No falls, BRBPR, melena.  No lower extremity swelling, abdominal distention, orthopnea, PND, early satiety.  [name] is tolerating Eliquis without any issues.  Blood pressure typically runs in the low 709G systolic at baseline.  Following cardioversion, her energy has significantly improved.  [name] does not have any issues or concerns today.  [name] has a scheduled follow-up with EP in September, 2020.  Labs: 09/2018 - magnesium 1.9, HGB 11.9, PLT 294, SCr 0.84, K+ 4.7 08/2018 - AST/ALT normal, albumin 4.2, LDL 91 05/2018 - A1c 6.3 11/2016 - TSH normal   Past Medical History:  Diagnosis Date  . Anemia   . Anxiety   . Colon polyps   . Constipation   . Depressive disorder, not elsewhere classified   . Diaphragmatic hernia without mention of obstruction or  gangrene   . Disorders of bursae and tendons in shoulder region, unspecified   . Diverticulosis of colon (without mention of hemorrhage)   . External hemorrhoid   . Fundic gland polyps of stomach, benign   . GERD (gastroesophageal reflux disease)   . History of hiatal hernia   . HTN  (hypertension)   . Hypothyroidism   . Internal hemorrhoids without mention of complication   . Moderate mitral regurgitation   . Obesity, unspecified   . OSA on CPAP    compliant with CPAP  . Other urinary incontinence   . Persistent atrial fibrillation   . Pure hypercholesterolemia   . Type II diabetes mellitus ([place])   . Urinary frequency   . Urinary urgency   . Vitamin B 12 deficiency     Past Surgical History:  Procedure Laterality Date  . ABDOMINAL HYSTERECTOMY  1972   Have both ovaries  . ATRIAL FIBRILLATION ABLATION  12/17/2016  . ATRIAL FIBRILLATION ABLATION N/A 12/17/2016   Procedure: ATRIAL FIBRILLATION ABLATION;  Surgeon: Emily Grayer, MD;  Location: [facility];  Service: Cardiovascular;  Laterality: N/A;  . [place]   [facility]; "Dr. Rex King"  . CARDIOVERSION N/A 05/27/2016   Procedure: CARDIOVERSION;  Surgeon: Emily Hampshire, MD;  Location: [facility];  Service: Cardiovascular;  Laterality: N/A;  . CARDIOVERSION N/A 11/25/2016   Procedure: CARDIOVERSION;  Surgeon: Emily Hampshire, MD;  Location: [facility];  Service: Cardiovascular;  Laterality: N/A;  . CARDIOVERSION N/A 09/18/2018   Procedure: CARDIOVERSION;  Surgeon: Emily Merritts, MD;  Location: [facility];  Service: Cardiovascular;  Laterality: N/A;  . CATARACT EXTRACTION W/ INTRAOCULAR LENS  IMPLANT, BILATERAL Bilateral   . COLONOSCOPY    . ELECTROPHYSIOLOGIC STUDY N/A 01/24/2016   Procedure: CARDIOVERSION;  Surgeon: Emily Merritts, MD;  Location: [facility];  Service: Cardiovascular;  Laterality: N/A;  . LAPAROSCOPIC CHOLECYSTECTOMY    . REDUCTION MAMMAPLASTY Bilateral   . TONSILLECTOMY    . TOTAL HIP ARTHROPLASTY Right 05/12/2018   Procedure: TOTAL HIP ARTHROPLASTY ANTERIOR APPROACH;  Surgeon: Emily Cancel, MD;  Location: [facility];  Service: Orthopedics;  Laterality: Right;  70 mins    Current Meds  Medication Sig  . apixaban (ELIQUIS) 5 MG TABS tablet Take 1 tablet (5 mg total) by  mouth 2 (two) times daily.  [name] atorvastatin (LIPITOR) 10 MG tablet TAKE 1 TABLET BY MOUTH EVERYDAY AT BEDTIME (Patient taking differently: Take 10 mg by mouth at bedtime. )  . Calcium Carb-Ergocalciferol 500-200 MG-UNIT TABS Take 1 tablet by mouth daily.   [name] diltiazem (CARDIZEM CD) 120 MG 24 hr capsule TAKE 1 CAPSULE BY MOUTH EVERY DAY  . escitalopram (LEXAPRO) 10 MG tablet Take 1 tablet by mouth once daily  . flecainide (TAMBOCOR) 50 MG tablet Take 1 tablet (50 mg total) by mouth 2 (two) times daily.  [name] glucose blood test strip CHECK BLOOD SUGAR 2 TIMES A DAY AS DIRECTED DX CODE E11.9  . metFORMIN (GLUCOPHAGE-XR) 500 MG 24 hr tablet TAKE 4 TABLETS BY MOUTH IN THE EVENING WITH DINNER (Patient taking differently: Take 1,000 mg by mouth 2 (two) times daily with a meal. )  . Multiple Vitamin (MULTIVITAMIN) tablet Take 1 tablet by mouth daily.    . sennosides-docusate sodium (SENOKOT-S) 8.6-50 MG tablet Take 1 tablet by mouth daily as needed for constipation.  . TOVIAZ 4 MG TB24 tablet TAKE 1 TABLET BY MOUTH EVERY DAY  . TRADJENTA 5 MG TABS tablet TAKE 1 TABLET  BY MOUTH EVERY DAY  . traZODone (DESYREL) 50 MG tablet TAKE 1-2 TABLETS (50-100 MG TOTAL) BY MOUTH AT BEDTIME AS NEEDED FOR SLEEP. (Patient taking differently: Take 50 mg by mouth at bedtime. )    Allergies:   Pravachol [pravastatin sodium] and Contrast media [iodinated diagnostic agents]   Social History:  The patient  reports that [name] has never smoked. [name] has never used smokeless tobacco. [name] reports that [name] does not drink alcohol or use drugs.   Family History:  The patient's family history includes Cancer in her mother; Dementia in her mother; Diabetes in her brother and mother; Hyperlipidemia in her father and mother; Hypertension in her sister; Lung cancer in her father; Pulmonary embolism in her mother; Stroke in her paternal grandfather and paternal grandmother.  ROS:   Review of Systems  Constitutional: Positive for  malaise/fatigue. Negative for chills, diaphoresis, fever and weight loss.       Improved fatigue  HENT: Negative for congestion.   Eyes: Negative for discharge and redness.  Respiratory: Negative for cough, hemoptysis, sputum production, shortness of breath and wheezing.   Cardiovascular: Negative for chest pain, palpitations, orthopnea, claudication, leg swelling and PND.  Gastrointestinal: Negative for abdominal pain, blood in stool, heartburn, melena, nausea and vomiting.  Genitourinary: Negative for hematuria.  Musculoskeletal: Negative for falls and myalgias.  Skin: Negative for rash.  Neurological: Negative for dizziness, tingling, tremors, sensory change, speech change, focal weakness, loss of consciousness and weakness.  Endo/Heme/Allergies: Does not bruise/bleed easily.  Psychiatric/Behavioral: Negative for substance abuse. The patient is not nervous/anxious.   All other systems reviewed and are negative.    PHYSICAL EXAM:  VS:  BP (!) 100/50 (BP Location: Left Arm, Patient Position: Sitting, Cuff Size: Normal)   Pulse 76   Temp (!) 97.4 F (36.3 C)   Ht 5\' 4"  (1.626 m)   Wt 162 lb 4 oz (73.6 kg)   LMP  (LMP Unknown)   SpO2 95%   BMI 27.85 kg/m  BMI: Body mass index is 27.85 kg/m.  Physical Exam  Constitutional: [name] is oriented to person, place, and time. [name] appears well-developed and well-nourished.  HENT:  Head: Normocephalic and atraumatic.  Eyes: Right eye exhibits no discharge. Left eye exhibits no discharge.  Neck: Normal range of motion. No JVD present.  Cardiovascular: Normal rate, regular rhythm, S1 normal, S2 normal and normal heart sounds. Exam reveals no distant heart sounds, no friction rub, no midsystolic click and no opening snap.  No murmur heard. Pulses:      Dorsalis pedis pulses are 2+ on the right side and 2+ on the left side.       Posterior tibial pulses are 2+ on the right side and 2+ on the left side.  Pulmonary/Chest: Effort normal and breath  sounds normal. No respiratory distress. [name] has no decreased breath sounds. [name] has no wheezes. [name] has no rales. [name] exhibits no tenderness.  Abdominal: Soft. [name] exhibits no distension. There is no abdominal tenderness.  Musculoskeletal:        General: No edema.  Neurological: [name] is alert and oriented to person, place, and time.  Skin: Skin is warm and dry. No cyanosis. Nails show no clubbing.  Psychiatric: [name] has a normal mood and affect. Her speech is normal and behavior is normal. Judgment and thought content normal.     EKG:  Was ordered and interpreted by me today. Shows NSR with sinus arrhythmia versus ectopic atrial rhythm, 1st degree AV block, 76  bpm, normal axis, no acute st/t changes  Recent Labs: 08/17/2018: ALT 9 09/15/2018: BUN 13; Creatinine, Ser 0.84; Hemoglobin 11.9; Magnesium 1.9; Platelets 294; Potassium 4.7; Sodium 137  08/17/2018: Cholesterol 153; HDL 43.80; LDL Cholesterol 91; Total CHOL/HDL Ratio 3; Triglycerides 92.0; VLDL 18.4   Estimated Creatinine Clearance: 60.4 mL/min (by C-G formula based on SCr of 0.84 mg/dL).   Wt Readings from Last 3 Encounters:  09/24/18 162 lb 4 oz (73.6 kg)  09/15/18 160 lb 8 oz (72.8 kg)  08/21/18 159 lb 8 oz (72.3 kg)     Other studies reviewed: Additional studies/records reviewed today include: summarized above  ASSESSMENT AND PLAN:  1. Atrial tachycardia: Status post successful cardioversion.  Asymptomatic.  Symptoms much improved.  Continue Cardizem CD 120 mg daily and flecainide 50 mg twice daily.  Follow-up with EP as directed.  Labs unrevealing.  2. Persistent A. fib: Maintaining sinus rhythm.  Continue flecainide, Cardizem, and Eliquis.  3. Pulmonary pretension: Appears stable.  Not requiring standing diuretic.  4. MVP: Asymptomatic.  Schedule echo in follow-up.  5. Hypertension: Blood pressure is well controlled to on the soft side.  For now, continue Cardizem CD 120 mg a day.  [name] will monitor blood pressure at  home.  6. Hyperlipidemia: Most recent LDL 91 from 08/2018.  Continue current dose Lipitor.  Disposition: F/u with Dr. Fletcher King in 6 months and EP as directed.  Current medicines are reviewed at length with the patient today.  The patient did not have any concerns regarding medicines.  Signed, Christell Faith, PA-C 09/24/2018 8:33 AM     [place] [address] [place] [address] [place], [place] [address] [phone]

## 2018-09-20 ENCOUNTER — Other Ambulatory Visit: Payer: Self-pay | Admitting: Family Medicine

## 2018-09-20 NOTE — H&P (Signed)
H&P Addendum, pre-cardioversion  Patient was seen and evaluated prior to -cardioversion procedure Symptoms, prior testing details again confirmed with the patient Patient examined, no significant change from prior exam Lab work reviewed in detail personally by myself Patient understands risk and benefit of the procedure, willing to proceed  Signed, Tim Gollan, MD, Ph.D [facility]  

## 2018-09-21 ENCOUNTER — Encounter: Payer: Self-pay | Admitting: Cardiovascular Disease

## 2018-09-23 ENCOUNTER — Telehealth: Payer: Self-pay | Admitting: Cardiovascular Disease

## 2018-09-23 NOTE — Telephone Encounter (Signed)

## 2018-09-24 ENCOUNTER — Encounter: Payer: Self-pay | Admitting: Physician Assistant

## 2018-09-24 ENCOUNTER — Other Ambulatory Visit: Payer: Self-pay

## 2018-09-24 ENCOUNTER — Ambulatory Visit (INDEPENDENT_AMBULATORY_CARE_PROVIDER_SITE_OTHER): Payer: Medicare HMO | Admitting: Physician Assistant

## 2018-09-24 VITALS — BP 100/50 | HR 76 | Temp 97.4°F | Ht 64.0 in | Wt 162.2 lb

## 2018-09-24 DIAGNOSIS — E782 Mixed hyperlipidemia: Secondary | ICD-10-CM | POA: Diagnosis not present

## 2018-09-24 DIAGNOSIS — I4819 Other persistent atrial fibrillation: Secondary | ICD-10-CM

## 2018-09-24 DIAGNOSIS — I340 Nonrheumatic mitral (valve) insufficiency: Secondary | ICD-10-CM | POA: Diagnosis not present

## 2018-09-24 DIAGNOSIS — I471 Supraventricular tachycardia: Secondary | ICD-10-CM | POA: Diagnosis not present

## 2018-09-24 DIAGNOSIS — I10 Essential (primary) hypertension: Secondary | ICD-10-CM | POA: Diagnosis not present

## 2018-09-24 NOTE — Patient Instructions (Signed)
Medication Instructions:  Your physician recommends that you continue on your current medications as directed. Please refer to the Current Medication list given to you today.  If you need a refill on your cardiac medications before your next appointment, please call your pharmacy.   Lab work: None ordered If you have labs (blood work) drawn today and your tests are completely normal, you will receive your results only by: [name] MyChart Message (if you have MyChart) OR . A paper copy in the mail If you have any lab test that is abnormal or we need to change your treatment, we will call you to review the results.  Testing/Procedures: None ordered  Follow-Up: At [facility], you and your health needs are our priority.  As part of our continuing mission to provide you with exceptional heart care, we have created designated Provider Care Teams.  These Care Teams include your primary Cardiologist (physician) and Advanced Practice Providers (APPs -  Physician Assistants and Nurse Practitioners) who all work together to provide you with the care you need, when you need it. You will need a follow up appointment in 6 months.  Please call our office 2 months in advance to schedule this appointment.  You may see Kathlyn Sacramento, MD or one of the following Advanced Practice Providers on your designated Care Team:   Murray Hodgkins, NP Christell Faith, PA-C . Marrianne Mood, PA-C

## 2018-09-25 ENCOUNTER — Other Ambulatory Visit: Payer: Self-pay | Admitting: Internal Medicine

## 2018-10-05 DIAGNOSIS — D225 Melanocytic nevi of trunk: Secondary | ICD-10-CM | POA: Diagnosis not present

## 2018-10-05 DIAGNOSIS — L821 Other seborrheic keratosis: Secondary | ICD-10-CM | POA: Diagnosis not present

## 2018-10-05 DIAGNOSIS — Z872 Personal history of diseases of the skin and subcutaneous tissue: Secondary | ICD-10-CM | POA: Diagnosis not present

## 2018-10-05 DIAGNOSIS — D1801 Hemangioma of skin and subcutaneous tissue: Secondary | ICD-10-CM | POA: Diagnosis not present

## 2018-10-07 ENCOUNTER — Other Ambulatory Visit: Payer: Self-pay | Admitting: Internal Medicine

## 2018-10-12 ENCOUNTER — Emergency Department
Admission: EM | Admit: 2018-10-12 | Discharge: 2018-10-12 | Disposition: A | Discharge status: Home / Self Care | Payer: Medicare HMO | Attending: Emergency Medicine | Admitting: Emergency Medicine

## 2018-10-12 ENCOUNTER — Encounter: Payer: Self-pay | Admitting: Emergency Medicine

## 2018-10-12 ENCOUNTER — Other Ambulatory Visit: Payer: Self-pay

## 2018-10-12 ENCOUNTER — Emergency Department: Payer: Medicare HMO

## 2018-10-12 DIAGNOSIS — E119 Type 2 diabetes mellitus without complications: Secondary | ICD-10-CM | POA: Insufficient documentation

## 2018-10-12 DIAGNOSIS — I10 Essential (primary) hypertension: Secondary | ICD-10-CM | POA: Insufficient documentation

## 2018-10-12 DIAGNOSIS — Y93E5 Activity, floor mopping and cleaning: Secondary | ICD-10-CM | POA: Diagnosis not present

## 2018-10-12 DIAGNOSIS — E039 Hypothyroidism, unspecified: Secondary | ICD-10-CM | POA: Insufficient documentation

## 2018-10-12 DIAGNOSIS — R220 Localized swelling, mass and lump, head: Secondary | ICD-10-CM | POA: Diagnosis not present

## 2018-10-12 DIAGNOSIS — W19XXXA Unspecified fall, initial encounter: Secondary | ICD-10-CM

## 2018-10-12 DIAGNOSIS — Z7901 Long term (current) use of anticoagulants: Secondary | ICD-10-CM | POA: Diagnosis not present

## 2018-10-12 DIAGNOSIS — Z7984 Long term (current) use of oral hypoglycemic drugs: Secondary | ICD-10-CM | POA: Diagnosis not present

## 2018-10-12 DIAGNOSIS — W1789XA Other fall from one level to another, initial encounter: Secondary | ICD-10-CM | POA: Diagnosis not present

## 2018-10-12 DIAGNOSIS — S0003XA Contusion of scalp, initial encounter: Secondary | ICD-10-CM | POA: Insufficient documentation

## 2018-10-12 DIAGNOSIS — Y999 Unspecified external cause status: Secondary | ICD-10-CM | POA: Insufficient documentation

## 2018-10-12 DIAGNOSIS — Y92010 Kitchen of single-family (private) house as the place of occurrence of the external cause: Secondary | ICD-10-CM | POA: Diagnosis not present

## 2018-10-12 DIAGNOSIS — Z96641 Presence of right artificial hip joint: Secondary | ICD-10-CM | POA: Insufficient documentation

## 2018-10-12 DIAGNOSIS — S0990XA Unspecified injury of head, initial encounter: Secondary | ICD-10-CM | POA: Diagnosis not present

## 2018-10-12 DIAGNOSIS — S199XXA Unspecified injury of neck, initial encounter: Secondary | ICD-10-CM | POA: Diagnosis not present

## 2018-10-12 LAB — BASIC METABOLIC PANEL
Anion gap: 8 (ref 5–15)
BUN: 20 mg/dL (ref 8–23)
CO2: 29 mmol/L (ref 22–32)
Calcium: 9.4 mg/dL (ref 8.9–10.3)
Chloride: 101 mmol/L (ref 98–111)
Creatinine, Ser: 0.82 mg/dL (ref 0.44–1.00)
GFR calc Af Amer: >60 mL/min (ref >60)
GFR calc non Af Amer: >60 mL/min (ref >60)
Glucose, Bld: 122 mg/dL — ABNORMAL HIGH (ref 70–99)
Potassium: 4.6 mmol/L (ref 3.5–5.1)
Sodium: 138 mmol/L (ref 135–145)

## 2018-10-12 LAB — CBC
HCT: 35.2 % — ABNORMAL LOW (ref 36.0–46.0)
Hemoglobin: 11.4 g/dL — ABNORMAL LOW (ref 12.0–15.0)
MCH: 29.2 pg (ref 26.0–34.0)
MCHC: 32.4 g/dL (ref 30.0–36.0)
MCV: 90 fL (ref 80.0–100.0)
Platelets: 289 10*3/uL (ref 150–400)
RBC: 3.91 MIL/uL (ref 3.87–5.11)
RDW: 13.3 % (ref 11.5–15.5)
WBC: 9.5 10*3/uL (ref 4.0–10.5)
nRBC: 0 % (ref 0.0–0.2)

## 2018-10-12 LAB — TROPONIN I (HIGH SENSITIVITY)
Troponin I (High Sensitivity): 3 ng/L (ref <18)
Troponin I (High Sensitivity): 4 ng/L (ref <18)

## 2018-10-12 MED ORDER — ACETAMINOPHEN 500 MG PO TABS
1000.0000 mg | ORAL_TABLET | Freq: Once | ORAL | Status: AC
  Administered 2018-10-12: 1000 mg via ORAL
  Filled 2018-10-12: qty 2

## 2018-10-12 NOTE — Discharge Instructions (Addendum)
Please seek medical attention for any high fevers, chest pain, shortness of breath, change in behavior, persistent vomiting, bloody stool or any other new or concerning symptoms.  

## 2018-10-12 NOTE — ED Triage Notes (Signed)
First Nurse Note:  Fall, hit head.  No LOC.  Takes Eliquis.  Patient is AAOx3.  Skin warm and dry.  NAD

## 2018-10-12 NOTE — ED Provider Notes (Signed)
[facility] Emergency Department Provider Note    ____________________________________________   I have reviewed the triage vital signs and the nursing notes.   HISTORY  Chief Complaint Fall  History limited by: Not Limited   HPI [name] is a 73 y.o. female who presents to the emergency department today after suffering a fall. The patient states she was trying to clean the top of a refrigerator when she fell. Thinks that she might have missed a step coming down. Initially hit her buttocks and then fell back and hit the back of her head. Does not think she lost consciousness. The patient does have a headache. Denies any significant pain in her neck but says she has some soreness. The patient has some soreness of the buttocks as well. Denies any pain to her extremities. Denies any recent illness. Is on eliquis.   Records reviewed. Per medical record review patient has a history of afib on eliquis.   Past Medical History:  Diagnosis Date  . Anemia   . Anxiety   . Colon polyps   . Constipation   . Depressive disorder, not elsewhere classified   . Diaphragmatic hernia without mention of obstruction or gangrene   . Disorders of bursae and tendons in shoulder region, unspecified   . Diverticulosis of colon (without mention of hemorrhage)   . External hemorrhoid   . Fundic gland polyps of stomach, benign   . GERD (gastroesophageal reflux disease)   . History of hiatal hernia   . HTN (hypertension)   . Hypothyroidism   . Internal hemorrhoids without mention of complication   . Moderate mitral regurgitation   . Obesity, unspecified   . OSA on CPAP    compliant with CPAP  . Other urinary incontinence   . Persistent atrial fibrillation   . Pure hypercholesterolemia   . Type II diabetes mellitus ([place])   . Urinary frequency   . Urinary urgency   . Vitamin B 12 deficiency     Patient Active Problem List   Diagnosis Date Noted  . Atrial tachycardia  ([place]) 09/15/2018  . 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 Vitamin B 12 deficiency   . Urge incontinence   . OSA on CPAP   . History of hiatal hernia   . GERD (gastroesophageal reflux disease)   . Anxiety   . Other constipation 01/02/2017  . Class 1 obesity with serious comorbidity and body mass index (BMI) of 30.0 to 30.9 in adult 01/02/2017  . Overweight (BMI 25.0-29.9) 12/19/2016  . Persistent atrial fibrillation 12/17/2016  . Vitamin D deficiency 11/20/2016  . Family history of ovarian cancer 05/24/2016  . Iron deficiency anemia   . Essential hypertension   . Preoperative evaluation to rule out surgical contraindication 05/16/2015  . Controlled diabetes mellitus type II without complication ([place]) 17/61/6073  . Mitral regurgitation 10/11/2013  . Incidental lung nodule 07/16/2011  . 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 SLEEP APNEA 10/25/2008  . Hypercholesterolemia 06/11/2006  . Depression, major, in remission ([place]) 06/11/2006  . HEMORRHOIDS, INTERNAL 06/11/2006  . DIVERTICULOSIS, COLON 06/11/2006  . STRESS INCONTINENCE 06/11/2006    Past Surgical History:  Procedure Laterality Date  . ABDOMINAL HYSTERECTOMY  1972   Have both ovaries  . ATRIAL FIBRILLATION ABLATION  12/17/2016  . ATRIAL FIBRILLATION ABLATION N/A 12/17/2016   Procedure: ATRIAL FIBRILLATION ABLATION;  Surgeon: Thompson Grayer, MD;  Location: [place] [facility];  Service: Cardiovascular;  Laterality: N/A;  .  [place]   [facility]; "Dr. [name]"  . CARDIOVERSION N/A 05/27/2016   Procedure: CARDIOVERSION;  Surgeon: [name], MD;  Location: [facility] OR[facility];  Service: Cardiovascular;  Laterality: N/A;  . CARDIOVERSION N/A 11/25/2016   Procedure: CARDIOVERSION;  Surgeon: [name], MD;  Location: [facility] OR[facility];  Service: Cardiovascular;  Laterality: N/A;  . CARDIOVERSION N/A 09/18/2018   Procedure: CARDIOVERSION;  Surgeon: [name], MD;  Location: [facility]  ORS;  Service: Cardiovascular;  Laterality: N/A;  . CATARACT EXTRACTION W/ INTRAOCULAR LENS  IMPLANT, BILATERAL Bilateral   . COLONOSCOPY    . ELECTROPHYSIOLOGIC STUDY N/A 01/24/2016   Procedure: CARDIOVERSION;  Surgeon: [name], MD;  Location: [facility] ORS;  Service: Cardiovascular;  Laterality: N/A;  . LAPAROSCOPIC CHOLECYSTECTOMY    . REDUCTION MAMMAPLASTY Bilateral   . TONSILLECTOMY    . TOTAL HIP ARTHROPLASTY Right 05/12/2018   Procedure: TOTAL HIP ARTHROPLASTY ANTERIOR APPROACH;  Surgeon: Paralee Cancel, MD;  Location: [facility];  Service: Orthopedics;  Laterality: Right;  70 mins    Prior to Admission medications   Medication Sig Start Date End Date Taking? Authorizing Provider  amoxicillin (AMOXIL) 500 MG tablet Take 2,000 mg by mouth as directed. 1 hr prior to dental procedure Take on 09/24/2018 (afternoon)    [provider]  apixaban (ELIQUIS) 5 MG TABS tablet Take 1 tablet (5 mg total) by mouth 2 (two) times daily. 08/10/18   Allred, Jeneen Rinks, MD  atorvastatin (LIPITOR) 10 MG tablet TAKE 1 TABLET BY MOUTH EVERYDAY AT BEDTIME Patient taking differently: Take 10 mg by mouth at bedtime.  08/26/18   Bedsole, Amy E, MD  Calcium Carb-Ergocalciferol 500-200 MG-UNIT TABS Take 1 tablet by mouth daily.  02/19/08   [provider]  diltiazem (CARDIZEM CD) 120 MG 24 hr capsule TAKE 1 CAPSULE BY MOUTH EVERY DAY 04/06/18   Allred, Jeneen Rinks, MD  escitalopram (LEXAPRO) 10 MG tablet Take 1 tablet by mouth once daily 09/21/18   Bedsole, Amy E, MD  flecainide (TAMBOCOR) 50 MG tablet Take 1 tablet (50 mg total) by mouth 2 (two) times daily. 09/15/18   Dunn, Areta Haber, PA-C  glucose blood test strip CHECK BLOOD SUGAR 2 TIMES A DAY AS DIRECTED DX CODE E11.9 05/19/18   [name], MD  metFORMIN (GLUCOPHAGE-XR) 500 MG 24 hr tablet Take 2 tablets (1,000 mg total) by mouth 2 (two) times daily with a meal. 09/25/18   [name], MD  Multiple Vitamin (MULTIVITAMIN) tablet Take 1 tablet by mouth  daily.      [provider]  sennosides-docusate sodium (SENOKOT-S) 8.6-50 MG tablet Take 1 tablet by mouth daily as needed for constipation.    [provider]  TOVIAZ 4 MG TB24 tablet TAKE 1 TABLET BY MOUTH EVERY DAY 08/18/18   Bedsole, Amy E, MD  TRADJENTA 5 MG TABS tablet TAKE 1 TABLET BY MOUTH EVERY DAY 10/07/18   [name], MD  traZODone (DESYREL) 50 MG tablet TAKE 1-2 TABLETS (50-100 MG TOTAL) BY MOUTH AT BEDTIME AS NEEDED FOR SLEEP. Patient taking differently: Take 50 mg by mouth at bedtime.  11/06/17   Wilhelmina Mcardle, MD    Allergies Pravachol [pravastatin sodium] and Contrast media [iodinated diagnostic agents]  Family History  Problem Relation Age of Onset  . Cancer Mother        fallopian tube  . Dementia Mother   . Pulmonary embolism Mother   . Hyperlipidemia Mother   . Diabetes Mother   . Hyperlipidemia Father   .  Lung cancer Father   . Hypertension Sister   . Diabetes Brother   . Stroke Paternal Grandmother   . Stroke Paternal Grandfather   . Colon cancer Neg Hx   . Esophageal cancer Neg Hx     Social History Social History   Tobacco Use  . Smoking status: Never Smoker  . Smokeless tobacco: Never Used  Substance Use Topics  . Alcohol use: No    Alcohol/week: 0.0 standard drinks  . Drug use: No    Review of Systems Constitutional: No fever/chills Eyes: No visual changes. ENT: No sore throat. Cardiovascular: Denies chest pain. Respiratory: Denies shortness of breath. Gastrointestinal: No abdominal pain.  No nausea, no vomiting.  No diarrhea.   Genitourinary: Negative for dysuria. Musculoskeletal: Positive for buttock pain. Skin: Negative for rash. Neurological: Positive for headache.  ____________________________________________   PHYSICAL EXAM:  VITAL SIGNS: ED Triage Vitals [10/12/18 1909]  Enc Vitals Group     BP (!) 164/65     Pulse Rate 73     Resp 18     Temp 98.8 F (37.1 C)     Temp Source Oral     SpO2 99 %    Constitutional: Alert and oriented.  Eyes: Conjunctivae are normal.  ENT      Head: Normocephalic      Nose: No congestion/rhinnorhea.      Mouth/Throat: Mucous membranes are moist.      Neck: No stridor. No midline tenderness. Hematological/Lymphatic/Immunilogical: No cervical lymphadenopathy. Cardiovascular: Normal rate, regular rhythm.  No murmurs, rubs, or gallops.  Respiratory: Normal respiratory effort without tachypnea nor retractions. Breath sounds are clear and equal bilaterally. No wheezes/rales/rhonchi. Gastrointestinal: Soft and non tender. No rebound. No guarding.  Genitourinary: Deferred Musculoskeletal: Normal range of motion in all extremities. No lower extremity edema. No spinal tenderness.  Neurologic:  Normal speech and language. No gross focal neurologic deficits are appreciated.  Skin:  Skin is warm, dry and intact. No rash noted. Psychiatric: Mood and affect are normal. Speech and behavior are normal. Patient exhibits appropriate insight and judgment.  ____________________________________________    LABS (pertinent positives/negatives)  Trop hs 3 -> 4 CBC wbc 9.5, hgb 11.4, plt 289 BMP wnl except glu 122  ____________________________________________   EKG  I, Nance Pear, attending physician, personally viewed and interpreted this EKG  EKG Time: 1916 Rate: 71 Rhythm: sinus rhythm with marked arrythmia Axis: normal Intervals: qtc 395 QRS: narrow ST changes: no st elevation Impression: abnormal ekg ____________________________________________    RADIOLOGY  CT head/cervical spine No acute traumatic findings  ____________________________________________   PROCEDURES  Procedures  ____________________________________________   INITIAL IMPRESSION / ASSESSMENT AND PLAN / ED COURSE  Pertinent labs & imaging results that were available during my care of the patient were reviewed by me and considered in my medical decision making (see  chart for details).   Patient presented to the emergency department today after a fall.  Patient is on Eliquis and did hit her head.  CT head and cervical spine negative for intracranial bleed or fracture.  Patient mildly anemic however is consistent with patient's baseline.  No concerning electrolyte abnormality.  Serial troponins both negative.  Discussed findings and return precautions with the patient.   ____________________________________________   FINAL CLINICAL IMPRESSION(S) / ED DIAGNOSES  Final diagnoses:  Fall, initial encounter  Hematoma of scalp, initial encounter     Note: This dictation was prepared with Dragon dictation. Any transcriptional errors that result from this process are unintentional  Nance Pear, MD 10/12/18 2234

## 2018-10-12 NOTE — ED Triage Notes (Signed)
Pt reports she fell this evening and hit head. Pt reports she doesn't have any recollection of fall or event leading to. Pt has raised area to posterior head, abrasions to the right lower leg. Pt denies recent dizziness and weakness.

## 2018-10-12 NOTE — ED Notes (Signed)
Pt has hematoma to back of head from fall. No bleeding noted. Pt alert

## 2018-11-12 ENCOUNTER — Encounter: Payer: Self-pay | Admitting: Pulmonary Disease

## 2018-11-13 ENCOUNTER — Other Ambulatory Visit: Payer: Self-pay

## 2018-11-13 DIAGNOSIS — R69 Illness, unspecified: Secondary | ICD-10-CM | POA: Diagnosis not present

## 2018-11-14 ENCOUNTER — Telehealth: Payer: Self-pay | Admitting: Physician Assistant

## 2018-11-14 NOTE — Telephone Encounter (Signed)
Pt called stating she felt she was back in Afib. HR between 129 and the 140s. Pressure 90s/60s. She feels fine when resting, gets a little lightheaded with exertion. No feelings of pre-syncope. She is not enthusiastic about adding a short-acting agent over the weekend and prefers to wait it out and be seen in the office on Monday. In review of her records, looks like she has been managing bouts of Afib RVR since 2017. I advised that she could wait until Monday to be seen as long as she was feeling OK and no syncopal episodes. She does not want to add any medication over the phone and I agree with this.  She is 100% compliant on eliquis, flecainide, and cardizem. I will message Dr. [name] Antonio office and Afib clinic for an appt on Monday. We discussed when to come to the ER. She expressed understanding of the plan.

## 2018-11-16 ENCOUNTER — Other Ambulatory Visit: Payer: Self-pay | Admitting: Family Medicine

## 2018-11-16 NOTE — Telephone Encounter (Signed)
Patient calling this morning to see if she still needs an appointment.  She states she converted back to normal rhythm yesterday and HR is 70's -80's.   Please advise.

## 2018-11-17 ENCOUNTER — Other Ambulatory Visit: Payer: Self-pay

## 2018-11-17 ENCOUNTER — Encounter: Payer: Self-pay | Admitting: Internal Medicine

## 2018-11-17 ENCOUNTER — Ambulatory Visit: Payer: Medicare HMO | Admitting: Internal Medicine

## 2018-11-17 VITALS — BP 140/6 | HR 76 | Ht 64.0 in | Wt 162.4 lb

## 2018-11-17 DIAGNOSIS — E119 Type 2 diabetes mellitus without complications: Secondary | ICD-10-CM

## 2018-11-17 DIAGNOSIS — Z23 Encounter for immunization: Secondary | ICD-10-CM | POA: Diagnosis not present

## 2018-11-17 DIAGNOSIS — E7800 Pure hypercholesterolemia, unspecified: Secondary | ICD-10-CM | POA: Diagnosis not present

## 2018-11-17 DIAGNOSIS — E663 Overweight: Secondary | ICD-10-CM

## 2018-11-17 LAB — POCT GLYCOSYLATED HEMOGLOBIN (HGB A1C): Hemoglobin A1C: 6.1 % — AB (ref 4.0–5.6)

## 2018-11-17 MED ORDER — LINAGLIPTIN 5 MG PO TABS: 5.0000 mg | ORAL_TABLET | Freq: Every day | ORAL | 11 refills | Status: DC

## 2018-11-17 NOTE — Patient Instructions (Signed)
Please continue: - Metformin ER 1000 mg 2x a day with meals - Tradjenta 5 mg before lunch  Please come back for a follow-up appointment in 6 months. 

## 2018-11-17 NOTE — Progress Notes (Signed)
Patient ID: [name], female   DOB: 1945-11-09, 73 y.o.   MRN: [id number]  HPI: [name] is a 73 y.o.-year-old female, presenting for f/u for DM2, dx 1995, insulin-dependent since 2014 - now off insulin since 12/2016, with improved control, without long term complications. Last visit 4.5 months ago (virtual).  She had another cardioversion since last visit.  She also developed another episode of A. fib last weekend but this resolved spontaneously.  She had increased stress since last visit that her husband fell and fractured his femur.  Last hemoglobin A1c was: Lab Results  Component Value Date   HGBA1C 6.1 (A) 11/17/2018   HGBA1C 6.3 (H) 05/07/2018   HGBA1C 6.2 (A) 04/16/2018   HGBA1C 6.4 (A) 12/01/2017   HGBA1C 6.7 (A) 07/25/2017   HGBA1C 6.7 03/27/2017   HGBA1C 6.4 (H) 11/20/2016   HGBA1C 6.9 09/16/2016   HGBA1C 6.9 05/23/2016   HGBA1C 6.8 02/23/2016   Pt is on a regimen of: - Metformin ER 1000 mg twice a day with meals.  She tried to take the entire dose with dinner but this was not as effective. - Tradjenta 5 mg before lunch She was on Basaglar 12 >> 14 units in hs >> stopped at last visit - but still takes it seldom (10-12 units)  Pt checks her sugars twice a day: - am: 97-120, 145 >> 98, 106-120s, 130 >> 100-127 >> 90-120 - 2h after b'fast: 90-103 >> 113-141 >> n/c - before lunch:90-130 >> 100-125, 199 >> n/c - 2h after lunch:  160-180 >> 140-160s >> n/c  >> 140-180 - before dinner: n/c >> 102-144 >> n/c - 2h after dinner: 160-180, 195 >> 160s >> 160-180 >> 140-180 - bedtime: 120-160 >> 140-160 >> n/c - nighttime: n/c Lowest sugar was 97 >> 90; she has hypoglycemia awareness in the 80s. Highest sugar was 200 (starch) >> 180 >> 180.  Pt's meals are: - Breakfast: cereals + 2% milk; bread + cheese; bread + PB - Lunch: sandwich; soup - Dinner: chicken; steak; seafood - Snacks: 2: PB crackers  -No CKD, last BUN/creatinine:  Lab Results  Component Value Date    BUN 20 10/12/2018   CREATININE 0.82 10/12/2018  Not on ACE inhibitor or ARB. -+ HL; last set of lipids: Lab Results  Component Value Date   CHOL 153 08/17/2018   HDL 43.80 08/17/2018   LDLCALC 91 08/17/2018   LDLDIRECT 163.1 07/04/2011   TRIG 92.0 08/17/2018   CHOLHDL 3 08/17/2018  On Lipitor.   - last eye exam was in 05/2017: No DR. [name]  She had cataract surgery in 04 and 07/2016.  In 03/2018 she had surgery at [facility]: film cleaned from her eye. -She denies numbness and tingling in her feet.  She has A. Fib >> she had several cardioversions. On Flecainide (dose lowered), Diltiazem, Eliquis. She was previously on Vesicare but had to stop due to cost.  She started [place], but this is not working as well. 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 She has a history of sciatica for which she had prednisone in the past.  Sugars are higher.  ROS: Constitutional: no weight gain/no weight loss, no fatigue, no subjective hyperthermia, no subjective hypothermia Eyes: no blurry vision, no xerophthalmia ENT: no sore throat, no nodules palpated in neck, no dysphagia, no odynophagia, no hoarseness Cardiovascular: no CP/no SOB/no palpitations/no leg swelling Respiratory: no cough/no SOB/no wheezing Gastrointestinal: no N/no V/no D/no C/no acid reflux Musculoskeletal: no  muscle aches/no joint aches Skin: no rashes, no hair loss Neurological: no tremors/no numbness/no tingling/no dizziness  I reviewed pt's medications, allergies, PMH, social hx, family hx, and changes were documented in the history of present illness. Otherwise, unchanged from my initial visit note.  Past Medical History:  Diagnosis Date  . Anemia   . Anxiety   . Colon polyps   . Constipation   . Depressive disorder, not elsewhere classified   . Diaphragmatic hernia without mention of obstruction or gangrene   . Disorders of bursae and tendons in shoulder region, unspecified   . Diverticulosis  of colon (without mention of hemorrhage)   . External hemorrhoid   . Fundic gland polyps of stomach, benign   . GERD (gastroesophageal reflux disease)   . History of hiatal hernia   . HTN (hypertension)   . Hypothyroidism   . Internal hemorrhoids without mention of complication   . Moderate mitral regurgitation   . Obesity, unspecified   . OSA on CPAP    compliant with CPAP  . Other urinary incontinence   . Persistent atrial fibrillation   . Pure hypercholesterolemia   . Type II diabetes mellitus ([place])   . Urinary frequency   . Urinary urgency   . Vitamin B 12 deficiency    Past Surgical History:  Procedure Laterality Date  . ABDOMINAL HYSTERECTOMY  1972   Have both ovaries  . ATRIAL FIBRILLATION ABLATION  12/17/2016  . ATRIAL FIBRILLATION ABLATION N/A 12/17/2016   Procedure: ATRIAL FIBRILLATION ABLATION;  Surgeon: Thompson Grayer, MD;  Location: [place] [facility];  Service: Cardiovascular;  Laterality: N/A;  . [place]   [facility]; "Dr. [name]"  . CARDIOVERSION N/A 05/27/2016   Procedure: CARDIOVERSION;  Surgeon: [name], MD;  Location: [facility];  Service: Cardiovascular;  Laterality: N/A;  . CARDIOVERSION N/A 11/25/2016   Procedure: CARDIOVERSION;  Surgeon: [name], MD;  Location: [facility];  Service: Cardiovascular;  Laterality: N/A;  . CARDIOVERSION N/A 09/18/2018   Procedure: CARDIOVERSION;  Surgeon: [name], MD;  Location: [facility];  Service: Cardiovascular;  Laterality: N/A;  . CATARACT EXTRACTION W/ INTRAOCULAR LENS  IMPLANT, BILATERAL Bilateral   . COLONOSCOPY    . ELECTROPHYSIOLOGIC STUDY N/A 01/24/2016   Procedure: CARDIOVERSION;  Surgeon: [name], MD;  Location: [facility];  Service: Cardiovascular;  Laterality: N/A;  . LAPAROSCOPIC CHOLECYSTECTOMY    . REDUCTION MAMMAPLASTY Bilateral   . TONSILLECTOMY    . TOTAL HIP ARTHROPLASTY Right 05/12/2018   Procedure: TOTAL HIP ARTHROPLASTY ANTERIOR APPROACH;  Surgeon: Paralee Cancel, MD;  Location: [facility];  Service: Orthopedics;  Laterality: Right;  70 mins   Social History   Socioeconomic History  . Marital status: Married    Spouse name: Laverna Peace  . Number of children: 2  . Years of education: Not on file  . Highest education level: Not on file  Occupational History  . Occupation: retired    Fish farm manager: UNEMPLOYED  Social Needs  . Financial resource strain: Not very hard  . Food insecurity    Worry: Sometimes true    Inability: Sometimes true  . Transportation needs    Medical: No    Non-medical: No  Tobacco Use  . Smoking status: Never Smoker  . Smokeless tobacco: Never Used  Substance and Sexual Activity  . Alcohol use: No    Alcohol/week: 0.0 standard drinks  . Drug use: No  . Sexual activity: Yes  Lifestyle  . Physical activity  Days per week: Not on file    Minutes per session: Not on file  . Stress: Only a little  Relationships  . Social connections    Talks on phone: More than three times a week    Gets together: Not on file    Attends religious service: Not on file    Active member of club or organization: Not on file    Attends meetings of clubs or organizations: Not on file    Relationship status: Not on file  . Intimate partner violence    Fear of current or ex partner: No    Emotionally abused: No    Physically abused: No    Forced sexual activity: No  Other Topics Concern  . Not on file  Social History Narrative   Exercise walks 3 times daily   Lives with spouse in [place] - retired from employment   1 son and 1 daughter      Diet fruit, occ fast food, salads and lean meat      3 tea/day, no EtOH, no tobacco, no drugs   Current Outpatient Medications on File Prior to Visit  Medication Sig Dispense Refill  . amoxicillin (AMOXIL) 500 MG tablet Take 2,000 mg by mouth as directed. 1 hr prior to dental procedure Take on 09/24/2018 (afternoon)    . apixaban (ELIQUIS) 5 MG TABS tablet Take 1 tablet (5 mg  total) by mouth 2 (two) times daily. 60 tablet 5  . atorvastatin (LIPITOR) 10 MG tablet TAKE 1 TABLET BY MOUTH EVERYDAY AT BEDTIME (Patient taking differently: Take 10 mg by mouth at bedtime. ) 90 tablet 3  . Calcium Carb-Ergocalciferol 500-200 MG-UNIT TABS Take 1 tablet by mouth daily.     [name] diltiazem (CARDIZEM CD) 120 MG 24 hr capsule TAKE 1 CAPSULE BY MOUTH EVERY DAY 90 capsule 2  . escitalopram (LEXAPRO) 10 MG tablet Take 1 tablet by mouth once daily 90 tablet 2  . flecainide (TAMBOCOR) 50 MG tablet Take 1 tablet (50 mg total) by mouth 2 (two) times daily. 180 tablet 3  . glucose blood test strip CHECK BLOOD SUGAR 2 TIMES A DAY AS DIRECTED DX CODE E11.9 100 each 7  . metFORMIN (GLUCOPHAGE-XR) 500 MG 24 hr tablet Take 2 tablets (1,000 mg total) by mouth 2 (two) times daily with a meal. 360 tablet 1  . Multiple Vitamin (MULTIVITAMIN) tablet Take 1 tablet by mouth daily.      . sennosides-docusate sodium (SENOKOT-S) 8.6-50 MG tablet Take 1 tablet by mouth daily as needed for constipation.    . TOVIAZ 4 MG TB24 tablet TAKE 1 TABLET BY MOUTH EVERY DAY 30 tablet 2  . TRADJENTA 5 MG TABS tablet TAKE 1 TABLET BY MOUTH EVERY DAY 30 tablet 1  . traZODone (DESYREL) 50 MG tablet TAKE 1-2 TABLETS (50-100 MG TOTAL) BY MOUTH AT BEDTIME AS NEEDED FOR SLEEP. (Patient taking differently: Take 50 mg by mouth at bedtime. ) 180 tablet 1   No current facility-administered medications on file prior to visit.    Allergies  Allergen Reactions  . Pravachol [Pravastatin Sodium]     Leg pain   . Contrast Media [Iodinated Diagnostic Agents] Rash   Family History  Problem Relation Age of Onset  . Cancer Mother        fallopian tube  . Dementia Mother   . Pulmonary embolism Mother   . Hyperlipidemia Mother   . Diabetes Mother   . Hyperlipidemia Father   .  Lung cancer Father   . Hypertension Sister   . Diabetes Brother   . Stroke Paternal Grandmother   . Stroke Paternal Grandfather   . Colon cancer Neg Hx    . Esophageal cancer Neg Hx     PE: BP (!) 140/6 (BP Location: Left Arm, Patient Position: Sitting, Cuff Size: Normal)   Pulse 76   Ht 5\' 4"  (1.626 m)   Wt 162 lb 6.4 oz (73.7 kg)   LMP  (LMP Unknown)   SpO2 97%   BMI 27.88 kg/m  Body mass index is 27.88 kg/m. Wt Readings from Last 3 Encounters:  11/17/18 162 lb 6.4 oz (73.7 kg)  09/24/18 162 lb 4 oz (73.6 kg)  09/15/18 160 lb 8 oz (72.8 kg)  06/30/2018: Weight: 157 lbs  Constitutional: overweight, in NAD Eyes: PERRLA, EOMI, no exophthalmos ENT: moist mucous membranes, no thyromegaly, no cervical lymphadenopathy Cardiovascular: RRR, No MRG Respiratory: CTA B Gastrointestinal: abdomen soft, NT, ND, BS+ Musculoskeletal: no deformities, strength intact in all 4 Skin: moist, warm, no rashes Neurological: no tremor with outstretched hands, DTR normal in all 4  ASSESSMENT: 1. DM2, previously insulin-dependent, controlled, without long term complications, but with hyperglycemia - she was contemplating gastric sleeve sx >> on hold now  2. Overweight  3. Hyperlipidemia  PLAN:  1. Patient with longstanding, now controlled, type 2 diabetes, initially insulin-dependent, now off insulin after she started to adjust her diet and lose weight.  She continues on metformin and DPP 4 inhibitor, which she tolerates well.  At last visit sugars were stable, at goal in the morning and after dinner but in the upper target range after this meal.  We did not change the regimen at that time as she was working on cutting down portions and she was losing weight. -At this visit, sugars are approximately the same as before except slightly better in the morning.  She is surprised by this as she is not exercising consistently.  However, she does plan to start this.  No need to change her regimen for now. - I suggested to:  Patient Instructions  Please continue: - Metformin ER 1000 mg 2x a day with meals - Tradjenta 5 mg before lunch  Please come back for  a follow-up appointment in 6 months.  - we checked her HbA1c: 6.1% (better) - advised to check sugars at different times of the day - 1x a day, rotating check times - advised for yearly eye exams >> she is not UTD but has one scheduled - + flu shot today - return to clinic in 6 months     2. Overweight -Before last visit, she lost 17 pounds after her hip surgery.  She gained about 5 pounds since last visit. -She was previously seen Dr. Leafy Ro in the weight loss clinic -Continue metformin and Tradjenta which are both weight neutral  3.  Hyperlipidemia -Reviewed latest lipid panel from 3 months ago: All fractions at goal Lab Results  Component Value Date   CHOL 153 08/17/2018   HDL 43.80 08/17/2018   LDLCALC 91 08/17/2018   LDLDIRECT 163.1 07/04/2011   TRIG 92.0 08/17/2018   CHOLHDL 3 08/17/2018  -Continues Lipitor without side effects  [name], MD PhD [facility]

## 2018-11-17 NOTE — Telephone Encounter (Signed)
Left message letting pt know if back in [facility] feeling well could keep follow up as scheduled with Dr. Rayann Heman on 9/28. If unsure or not feeling well would be happy to set up for EKG either in [place] or afib clinic.

## 2018-11-27 ENCOUNTER — Telehealth: Payer: Self-pay

## 2018-11-27 NOTE — Telephone Encounter (Signed)
Spoke with pt regarding appt on 11/30/18. Pt was advise to check her vitals prior to her appt. Pt questions was address.

## 2018-11-30 ENCOUNTER — Telehealth (INDEPENDENT_AMBULATORY_CARE_PROVIDER_SITE_OTHER): Payer: Medicare HMO | Admitting: Internal Medicine

## 2018-11-30 ENCOUNTER — Encounter: Payer: Self-pay | Admitting: Internal Medicine

## 2018-11-30 VITALS — BP 110/58 | HR 71 | Ht 64.0 in | Wt 162.0 lb

## 2018-11-30 DIAGNOSIS — I340 Nonrheumatic mitral (valve) insufficiency: Secondary | ICD-10-CM

## 2018-11-30 DIAGNOSIS — I10 Essential (primary) hypertension: Secondary | ICD-10-CM | POA: Diagnosis not present

## 2018-11-30 DIAGNOSIS — G4733 Obstructive sleep apnea (adult) (pediatric): Secondary | ICD-10-CM

## 2018-11-30 DIAGNOSIS — I471 Supraventricular tachycardia: Secondary | ICD-10-CM

## 2018-11-30 DIAGNOSIS — I4819 Other persistent atrial fibrillation: Secondary | ICD-10-CM | POA: Diagnosis not present

## 2018-11-30 NOTE — Progress Notes (Signed)
Electrophysiology TeleHealth Note   Due to national recommendations of social distancing due to COVID 19, an audio/video telehealth visit is felt to be most appropriate for this patient at this time.  See MyChart message from today for the patient's consent to telehealth for [facility].   Date:  11/30/2018   ID:  [name], DOB April 15, 1945, MRN [id number]  Location: patient's home  Provider location:  [facility]  Evaluation Performed: Follow-up visit  PCP:  Jinny Sanders, MD   Electrophysiologist:  Dr Rayann Heman  Chief Complaint:  palpitations  History of Present Illness:    [name] is a 73 y.o. female who presents via telehealth conferencing today.  Since last being seen in our clinic, the patient reports doing very well.  Today, she denies symptoms of palpitations, chest pain, shortness of breath,  lower extremity edema, dizziness, presyncope, or syncope.  She continues to have some afib with palpitations.  The patient is otherwise without complaint today.  The patient denies symptoms of fevers, chills, cough, or new SOB worrisome for COVID 19.  Past Medical History:  Diagnosis Date   Anemia    Anxiety    Colon polyps    Constipation    Depressive disorder, not elsewhere classified    Diaphragmatic hernia without mention of obstruction or gangrene    Disorders of bursae and tendons in shoulder region, unspecified    Diverticulosis of colon (without mention of hemorrhage)    External hemorrhoid    Fundic gland polyps of stomach, benign    GERD (gastroesophageal reflux disease)    History of hiatal hernia    HTN (hypertension)    Hypothyroidism    Internal hemorrhoids without mention of complication    Moderate mitral regurgitation    Obesity, unspecified    OSA on CPAP    compliant with CPAP   Other urinary incontinence    Persistent atrial fibrillation    Pure hypercholesterolemia    Type II diabetes mellitus ([place])     Urinary frequency    Urinary urgency    Vitamin B 12 deficiency     Past Surgical History:  Procedure Laterality Date   ABDOMINAL HYSTERECTOMY  1972   Have both ovaries   ATRIAL FIBRILLATION ABLATION  12/17/2016   ATRIAL FIBRILLATION ABLATION N/A 12/17/2016   Procedure: ATRIAL FIBRILLATION ABLATION;  Surgeon: Thompson Grayer, MD;  Location: [place] [facility];  Service: Cardiovascular;  Laterality: N/A;   [place]   [place]; "Dr. [name]"   CARDIOVERSION N/A 05/27/2016   Procedure: CARDIOVERSION;  Surgeon: [name], MD;  Location: [facility];  Service: Cardiovascular;  Laterality: N/A;   CARDIOVERSION N/A 11/25/2016   Procedure: CARDIOVERSION;  Surgeon: [name], MD;  Location: [facility];  Service: Cardiovascular;  Laterality: N/A;   CARDIOVERSION N/A 09/18/2018   Procedure: CARDIOVERSION;  Surgeon: [name], MD;  Location: [facility];  Service: Cardiovascular;  Laterality: N/A;   CATARACT EXTRACTION W/ INTRAOCULAR LENS  IMPLANT, BILATERAL Bilateral    COLONOSCOPY     ELECTROPHYSIOLOGIC STUDY N/A 01/24/2016   Procedure: CARDIOVERSION;  Surgeon: [name], MD;  Location: [facility];  Service: Cardiovascular;  Laterality: N/A;   LAPAROSCOPIC CHOLECYSTECTOMY     REDUCTION MAMMAPLASTY Bilateral    TONSILLECTOMY     TOTAL HIP ARTHROPLASTY Right 05/12/2018   Procedure: TOTAL HIP ARTHROPLASTY ANTERIOR APPROACH;  Surgeon: Paralee Cancel, MD;  Location: [facility];  Service: Orthopedics;  Laterality: Right;  70  mins    Current Outpatient Medications  Medication Sig Dispense Refill   amoxicillin (AMOXIL) 500 MG tablet Take 2,000 mg by mouth as directed. 1 hr prior to dental procedure Take on 09/24/2018 (afternoon)     apixaban (ELIQUIS) 5 MG TABS tablet Take 1 tablet (5 mg total) by mouth 2 (two) times daily. 60 tablet 5   atorvastatin (LIPITOR) 10 MG tablet TAKE 1 TABLET BY MOUTH EVERYDAY AT BEDTIME (Patient taking differently: Take 10 mg by  mouth at bedtime. ) 90 tablet 3   Calcium Carb-Ergocalciferol 500-200 MG-UNIT TABS Take 1 tablet by mouth daily.      diltiazem (CARDIZEM CD) 120 MG 24 hr capsule TAKE 1 CAPSULE BY MOUTH EVERY DAY 90 capsule 2   escitalopram (LEXAPRO) 10 MG tablet Take 1 tablet by mouth once daily 90 tablet 2   flecainide (TAMBOCOR) 50 MG tablet Take 1 tablet (50 mg total) by mouth 2 (two) times daily. 180 tablet 3   glucose blood test strip CHECK BLOOD SUGAR 2 TIMES A DAY AS DIRECTED DX CODE E11.9 100 each 7   linagliptin (TRADJENTA) 5 MG TABS tablet Take 1 tablet (5 mg total) by mouth daily. 30 tablet 11   metFORMIN (GLUCOPHAGE-XR) 500 MG 24 hr tablet Take 2 tablets (1,000 mg total) by mouth 2 (two) times daily with a meal. 360 tablet 1   Multiple Vitamin (MULTIVITAMIN) tablet Take 1 tablet by mouth daily.       sennosides-docusate sodium (SENOKOT-S) 8.6-50 MG tablet Take 1 tablet by mouth daily as needed for constipation.     TOVIAZ 4 MG TB24 tablet TAKE 1 TABLET BY MOUTH EVERY DAY 30 tablet 2   traZODone (DESYREL) 50 MG tablet TAKE 1-2 TABLETS (50-100 MG TOTAL) BY MOUTH AT BEDTIME AS NEEDED FOR SLEEP. (Patient taking differently: Take 50 mg by mouth at bedtime. ) 180 tablet 1   No current facility-administered medications for this visit.     Allergies:   Pravachol [pravastatin sodium] and Contrast media [iodinated diagnostic agents]   Social History:  The patient  reports that she has never smoked. She has never used smokeless tobacco. She reports that she does not drink alcohol or use drugs.   Family History:  The patient's family history includes Cancer in her mother; Dementia in her mother; Diabetes in her brother and mother; Hyperlipidemia in her father and mother; Hypertension in her sister; Lung cancer in her father; Pulmonary embolism in her mother; Stroke in her paternal grandfather and paternal grandmother.   ROS:  Please see the history of present illness.   All other systems are  personally reviewed and negative.    Exam:    Vital Signs:  BP (!) 110/58    Pulse 71    Ht 5\' 4"  (1.626 m)    Wt 162 lb (73.5 kg)    LMP  (LMP Unknown)    BMI 27.81 kg/m   Well sounding and appearing, alert and conversant, regular work of breathing,  good skin color Eyes- anicteric, neuro- grossly intact, skin- no apparent rash or lesions or cyanosis, mouth- oral mucosa is pink  Labs/Other Tests and Data Reviewed:    Recent Labs: 08/17/2018: ALT 9 09/15/2018: Magnesium 1.9 10/12/2018: BUN 20; Creatinine, Ser 0.82; Hemoglobin 11.4; Platelets 289; Potassium 4.6; Sodium 138   Wt Readings from Last 3 Encounters:  11/30/18 162 lb (73.5 kg)  11/17/18 162 lb 6.4 oz (73.7 kg)  09/24/18 162 lb 4 oz (73.6 kg)  ASSESSMENT & PLAN:    1.  Persistent afib She also has a h/o atrial tachycardia chads2vasc score is 4.  She is doing well with eliquis Doing well with flecainide despite severe LA enlargement.  2. HTN Stable No change required today  3. OSA Compliant with CPAP  4. Moderate MR She has severe LA enlargement with afib, pulmonary hypertension, and moderate MR.  LVEDD 53.  I believe that we should have a low threshold to send to surgery.  Would consider referral to Dr Roxy Manns sooner than later.   Follow-up:  3 months in AF clinic   Patient Risk:  after full review of this patients clinical status, I feel that they are at moderate risk at this time.  Today, I have spent 15 minutes with the patient with telehealth technology discussing arrhythmia management .    Army Fossa, MD  11/30/2018 8:25 AM     [facility] [address] [place] [address] [phone] (office) [phone] (fax)

## 2018-12-21 IMAGING — CT CT HEART MORPH/PULM VEIN W/ CM & W/O CA SCORE
2 of 6 series · 10 of 20 positions shown, 12 images · non-contrast
Comparison: 07/21/2012

CLINICAL DATA: Atrial fibrillation scheduled for an ablation.

EXAM:
Cardiac CT/CTA
TECHNIQUE: The patient was scanned on a Siemens Somatom scanner.

[Series 6: best diast · axial · 0.35mm/px · z∈[-194,-112]mm · 5 of 310 slices shown, 7 images]
[im 52/310  vessel]
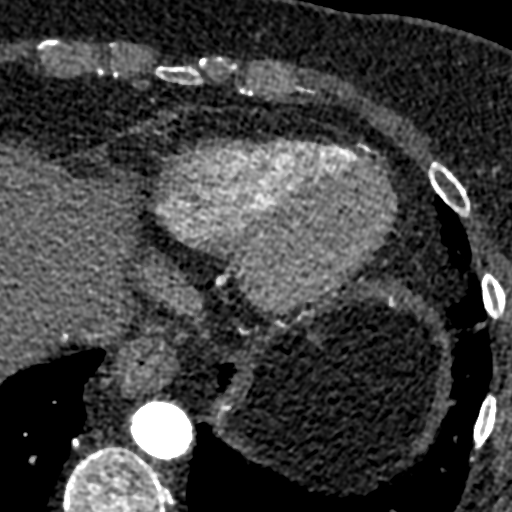
[im 52/310  lung]
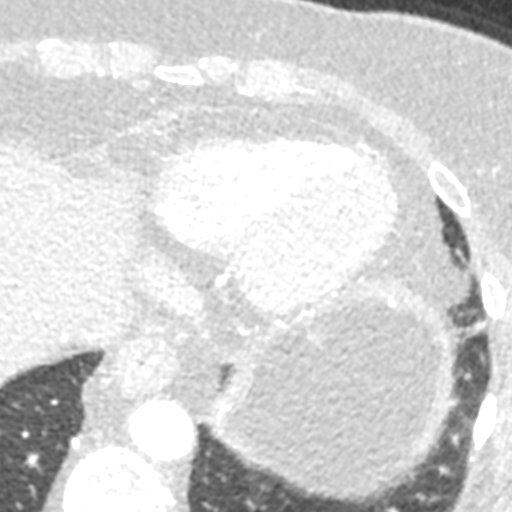
[im 104/310  vessel]
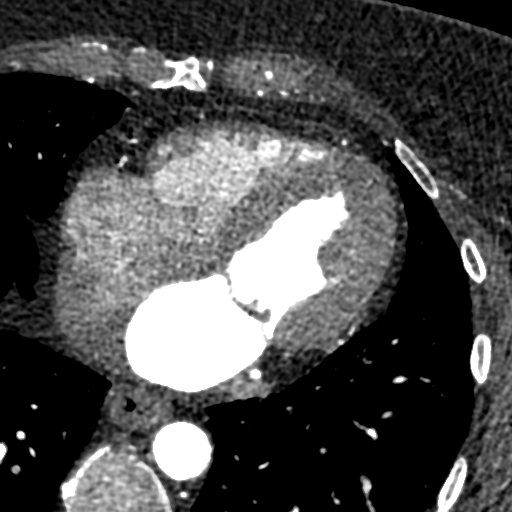
[im 155/310  vessel]
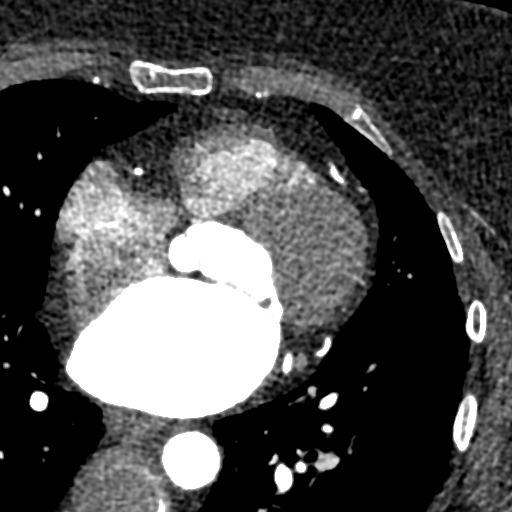
[im 207/310  vessel]
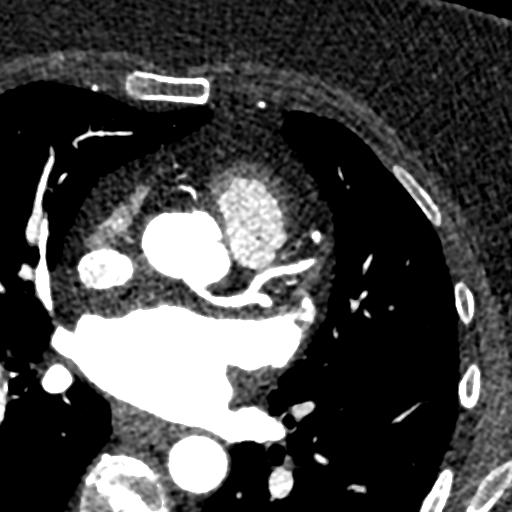
[im 258/310  vessel]
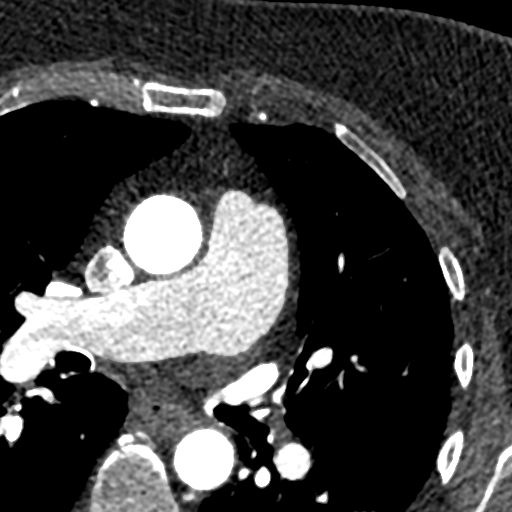
[im 258/310  lung]
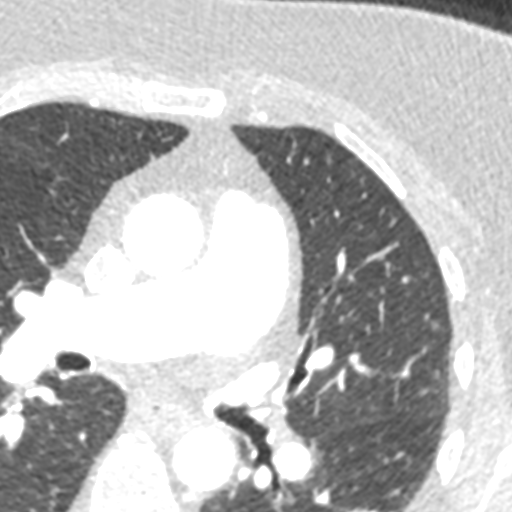

[Series 7: +300 ms · axial · 0.35mm/px · z∈[-194,-112]mm · 5 of 310 slices shown]
[im 52/310  vessel]
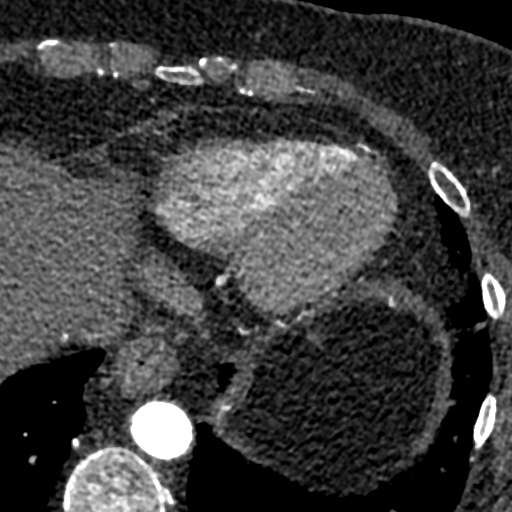
[im 104/310  vessel]
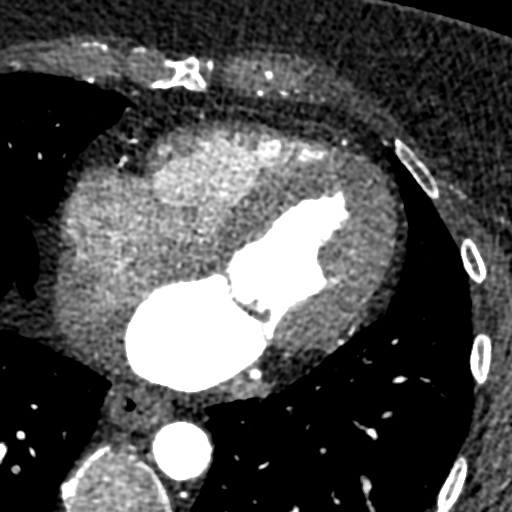
[im 155/310  vessel]
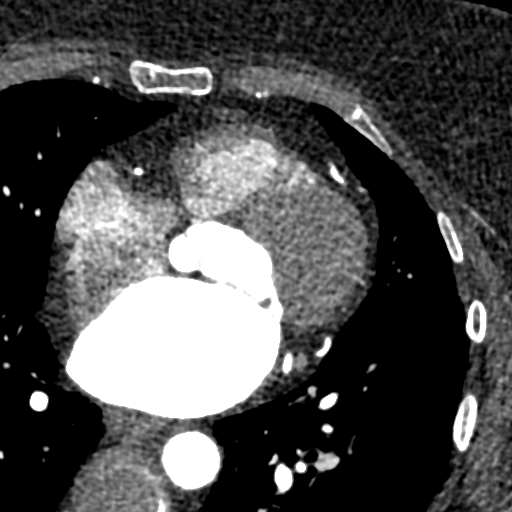
[im 207/310  vessel]
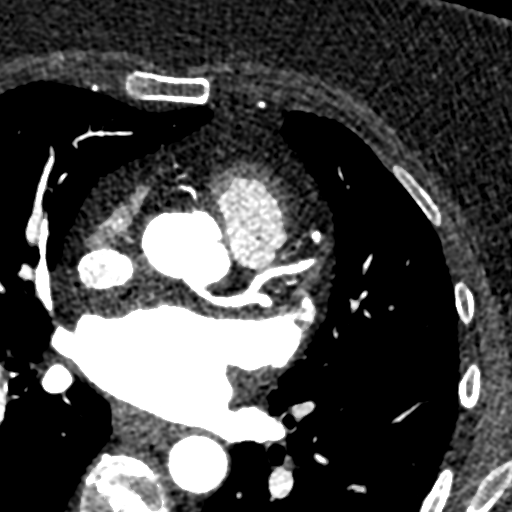
[im 258/310  vessel]
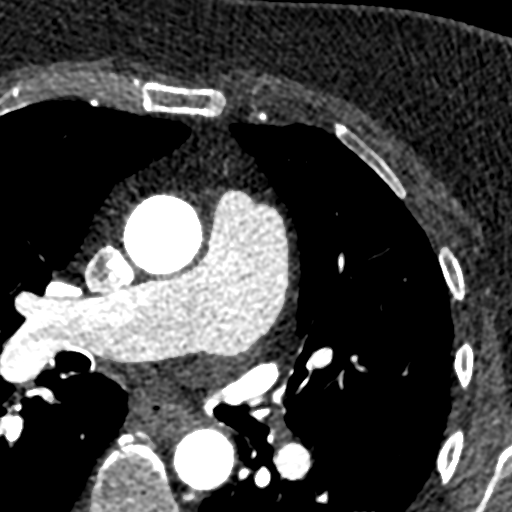

[10 of 20 positions shown; findings below may reference images not displayed]

FINDINGS: A 120 kV prospective scan was triggered in the descending thoracic
aorta at 111 HU's. Gantry rotation speed was 280 msecs and
collimation was .9 mm. No beta blockade and no NTG was given. The 3D
data set was reconstructed in 5% intervals of the 60-80 % of the R-R
cycle. Diastolic phases were analyzed on a dedicated work station
using MPR, MIP and VRT modes. The patient received 80 cc of
contrast.

There is normal pulmonary vein drainage into the left atrium (2 on
the right and 2 on the left) with ostial measurements as follows:

RUPV:  22 x 16 mm

RLPV:  21 x 14 mm

LUPV:  18 x 14 mm

LLPV:  15 x 8 mm

The left atrial appendage is medium size with chicken wing
morphology and one major lobe. Ostial size 22 x 16 mm mm and length
32 mm. There is no thrombus in the left atrial appendage.

The esophagus runs in the left atrial midline and is not in the
proximity to any of the pulmonary veins.

Aorta:  Normal caliber.  No dissection or calcifications.

Aortic Valve:  Trileaflet.  No calcifications.

Coronary Arteries:  Normal coronary origin. Left dominance.

Calcium score of 0.

RCA is a medium size non-dominant artery with no plaque.

LM is a very large and long artery with no plaque.

LAD is a large artery that gives rise to one diagonal branch and has
no plaque.

LCX is a large dominant artery that gives rise to one OM branch, PDA
and PLVB. There is no obvious plaque.
IMPRESSION: 1. There is normal pulmonary vein drainage into the left atrium.

2. The left atrial appendage is medium size with chicken wing
morphology and one major lobe. Ostial size 22 x 16 mm mm and length
32 mm. There is no thrombus in the left atrial appendage.

3. The esophagus runs in the left atrial midline and is not in the
proximity to any of the pulmonary veins.

4. Pulmonary artery is mildly dilated measuring 33 mm suggestive of
pulmonary hypertension.

5. Calcium score 0. Left dominance.  No evidence for CAD.

Esterino Liga

EXAM:
OVER-READ INTERPRETATION  CT CHEST

The following report is an over-read performed by radiologist Dr.
Uzo Sagiru [REDACTED] on 12/12/2016. This over-read
does not include interpretation of cardiac or coronary anatomy or
pathology. The coronary CTA interpretation by the cardiologist is
attached.
FINDINGS: Cardiovascular: Heart is normal size. Visualized aorta normal
caliber.

Mediastinum/Nodes: No adenopathy in the lower mediastinum or hila.

Lungs/Pleura: Minimal dependent atelectasis.  No effusions.

Upper Abdomen: Imaging into the upper abdomen shows no acute
findings.

Musculoskeletal: Chest wall soft tissues are unremarkable. No acute
bony abnormality.
IMPRESSION: No acute or significant extracardiac abnormality.

## 2019-01-01 ENCOUNTER — Other Ambulatory Visit: Payer: Self-pay

## 2019-01-01 ENCOUNTER — Encounter: Payer: Self-pay | Admitting: Emergency Medicine

## 2019-01-01 ENCOUNTER — Emergency Department: Payer: Medicare HMO

## 2019-01-01 ENCOUNTER — Emergency Department
Admission: EM | Admit: 2019-01-01 | Discharge: 2019-01-01 | Disposition: A | Discharge status: Home / Self Care | Payer: Medicare HMO | Attending: Emergency Medicine | Admitting: Emergency Medicine

## 2019-01-01 DIAGNOSIS — Z79899 Other long term (current) drug therapy: Secondary | ICD-10-CM | POA: Insufficient documentation

## 2019-01-01 DIAGNOSIS — E039 Hypothyroidism, unspecified: Secondary | ICD-10-CM | POA: Diagnosis not present

## 2019-01-01 DIAGNOSIS — E119 Type 2 diabetes mellitus without complications: Secondary | ICD-10-CM | POA: Insufficient documentation

## 2019-01-01 DIAGNOSIS — R109 Unspecified abdominal pain: Secondary | ICD-10-CM | POA: Diagnosis not present

## 2019-01-01 DIAGNOSIS — Z96641 Presence of right artificial hip joint: Secondary | ICD-10-CM | POA: Insufficient documentation

## 2019-01-01 DIAGNOSIS — Z7901 Long term (current) use of anticoagulants: Secondary | ICD-10-CM | POA: Diagnosis not present

## 2019-01-01 DIAGNOSIS — K5900 Constipation, unspecified: Secondary | ICD-10-CM | POA: Insufficient documentation

## 2019-01-01 DIAGNOSIS — Z7984 Long term (current) use of oral hypoglycemic drugs: Secondary | ICD-10-CM | POA: Insufficient documentation

## 2019-01-01 DIAGNOSIS — I10 Essential (primary) hypertension: Secondary | ICD-10-CM | POA: Diagnosis not present

## 2019-01-01 LAB — COMPREHENSIVE METABOLIC PANEL
ALT: 14 U/L (ref 0–44)
AST: 18 U/L (ref 15–41)
Albumin: 4.6 g/dL (ref 3.5–5.0)
Alkaline Phosphatase: 54 U/L (ref 38–126)
Anion gap: 10 (ref 5–15)
BUN: 17 mg/dL (ref 8–23)
CO2: 25 mmol/L (ref 22–32)
Calcium: 9.9 mg/dL (ref 8.9–10.3)
Chloride: 95 mmol/L — ABNORMAL LOW (ref 98–111)
Creatinine, Ser: 0.96 mg/dL (ref 0.44–1.00)
GFR calc Af Amer: >60 mL/min (ref >60)
GFR calc non Af Amer: 60 mL/min — ABNORMAL LOW (ref >60)
Glucose, Bld: 137 mg/dL — ABNORMAL HIGH (ref 70–99)
Potassium: 4.6 mmol/L (ref 3.5–5.1)
Sodium: 130 mmol/L — ABNORMAL LOW (ref 135–145)
Total Bilirubin: 0.5 mg/dL (ref 0.3–1.2)
Total Protein: 7.6 g/dL (ref 6.5–8.1)

## 2019-01-01 LAB — URINALYSIS, COMPLETE (UACMP) WITH MICROSCOPIC
Bilirubin Urine: NEGATIVE
Glucose, UA: NEGATIVE mg/dL
Ketones, ur: NEGATIVE mg/dL
Nitrite: POSITIVE — AB
Protein, ur: NEGATIVE mg/dL
Specific Gravity, Urine: 1.01 (ref 1.005–1.030)
WBC, UA: >50 WBC/hpf — ABNORMAL HIGH (ref 0–5)
pH: 5 (ref 5.0–8.0)

## 2019-01-01 LAB — CBC
HCT: 37.3 % (ref 36.0–46.0)
Hemoglobin: 12.3 g/dL (ref 12.0–15.0)
MCH: 29.6 pg (ref 26.0–34.0)
MCHC: 33 g/dL (ref 30.0–36.0)
MCV: 89.7 fL (ref 80.0–100.0)
Platelets: 288 10*3/uL (ref 150–400)
RBC: 4.16 MIL/uL (ref 3.87–5.11)
RDW: 12.7 % (ref 11.5–15.5)
WBC: 6.7 10*3/uL (ref 4.0–10.5)
nRBC: 0 % (ref 0.0–0.2)

## 2019-01-01 LAB — LIPASE, BLOOD: Lipase: 32 U/L (ref 11–51)

## 2019-01-01 MED ORDER — LACTULOSE 20 GM/30ML PO SOLN: 15.0000 mL | Freq: Every day | ORAL | 0 refills | Status: DC

## 2019-01-01 MED ORDER — BISACODYL 10 MG RE SUPP: 10.0000 mg | RECTAL | 0 refills | Status: DC | PRN

## 2019-01-01 MED ORDER — SODIUM CHLORIDE 0.9% FLUSH: 3.0000 mL | Freq: Once | INTRAVENOUS | Status: DC

## 2019-01-01 NOTE — ED Notes (Signed)
Patient transported to X-ray 

## 2019-01-01 NOTE — ED Triage Notes (Signed)
Pt states constipation for the past five days, did measures at home for bm with no result. Sent here from [facility] with concern for SBO. Pt states slight nausea however, no vomiting at this time. NAD.

## 2019-01-01 NOTE — ED Provider Notes (Signed)
[facility] Emergency Department Provider Note   ____________________________________________    I have reviewed the triage vital signs and the nursing notes.   HISTORY  Chief Complaint Constipation     HPI [name] is a 73 y.o. female who presents with complaints of constipation.  Patient reports she has not had a bowel movement in 5 days.  She denies significant abdominal pain, complains of some mild bloating.  Went to urgent care and they sent her here for evaluation of possible SBO.  Denies a history of abdominal surgery except for laparoscopic cholecystectomy.  No fevers or chills.  No nausea or vomiting.  Is hungry  Past Medical History:  Diagnosis Date  . Anemia   . Anxiety   . Colon polyps   . Constipation   . Depressive disorder, not elsewhere classified   . Diaphragmatic hernia without mention of obstruction or gangrene   . Disorders of bursae and tendons in shoulder region, unspecified   . Diverticulosis of colon (without mention of hemorrhage)   . External hemorrhoid   . Fundic gland polyps of stomach, benign   . GERD (gastroesophageal reflux disease)   . History of hiatal hernia   . HTN (hypertension)   . Hypothyroidism   . Internal hemorrhoids without mention of complication   . Moderate mitral regurgitation   . Obesity, unspecified   . OSA on CPAP    compliant with CPAP  . Other urinary incontinence   . Persistent atrial fibrillation ([place])   . Pure hypercholesterolemia   . Type II diabetes mellitus ([place])   . Urinary frequency   . Urinary urgency   . Vitamin B 12 deficiency     Patient Active Problem List   Diagnosis Date Noted  . Atrial tachycardia ([place]) 09/15/2018  . 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 Vitamin B 12 deficiency   . Urge incontinence   . OSA on CPAP   . History of hiatal hernia   . GERD (gastroesophageal reflux disease)   . Anxiety   . Other  constipation 01/02/2017  . Class 1 obesity with serious comorbidity and body mass index (BMI) of 30.0 to 30.9 in adult 01/02/2017  . Overweight (BMI 25.0-29.9) 12/19/2016  . Persistent atrial fibrillation ([place]) 12/17/2016  . Vitamin D deficiency 11/20/2016  . Family history of ovarian cancer 05/24/2016  . Iron deficiency anemia   . Essential hypertension   . Preoperative evaluation to rule out surgical contraindication 05/16/2015  . Controlled diabetes mellitus type II without complication ([place]) [id number]  . Mitral regurgitation 10/11/2013  . Incidental lung nodule 07/16/2011  . 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 SLEEP APNEA 10/25/2008  . Hypercholesterolemia 06/11/2006  . Depression, major, in remission ([place]) 06/11/2006  . HEMORRHOIDS, INTERNAL 06/11/2006  . DIVERTICULOSIS, COLON 06/11/2006  . STRESS INCONTINENCE 06/11/2006    Past Surgical History:  Procedure Laterality Date  . ABDOMINAL HYSTERECTOMY  1972   Have both ovaries  . ATRIAL FIBRILLATION ABLATION  12/17/2016  . ATRIAL FIBRILLATION ABLATION N/A 12/17/2016   Procedure: ATRIAL FIBRILLATION ABLATION;  Surgeon: Thompson Grayer, MD;  Location: [place] [facility];  Service: Cardiovascular;  Laterality: N/A;  . [place]   [facility]; "Dr. [name]"  . CARDIOVERSION N/A 05/27/2016   Procedure: CARDIOVERSION;  Surgeon: [name], MD;  Location: [facility] ORS;  Service: Cardiovascular;  Laterality: N/A;  . CARDIOVERSION N/A 11/25/2016   Procedure: CARDIOVERSION;  Surgeon: Fletcher Anon,  Mertie Clause, MD;  Location: [facility] ORS;  Service: Cardiovascular;  Laterality: N/A;  . CARDIOVERSION N/A 09/18/2018   Procedure: CARDIOVERSION;  Surgeon: [name], MD;  Location: [facility] ORS;  Service: Cardiovascular;  Laterality: N/A;  . CATARACT EXTRACTION W/ INTRAOCULAR LENS  IMPLANT, BILATERAL Bilateral   . COLONOSCOPY    . ELECTROPHYSIOLOGIC STUDY N/A 01/24/2016   Procedure: CARDIOVERSION;  Surgeon: [name], MD;  Location: [facility]  ORS;  Service: Cardiovascular;  Laterality: N/A;  . LAPAROSCOPIC CHOLECYSTECTOMY    . REDUCTION MAMMAPLASTY Bilateral   . TONSILLECTOMY    . TOTAL HIP ARTHROPLASTY Right 05/12/2018   Procedure: TOTAL HIP ARTHROPLASTY ANTERIOR APPROACH;  Surgeon: Paralee Cancel, MD;  Location: [facility];  Service: Orthopedics;  Laterality: Right;  70 mins    Prior to Admission medications   Medication Sig Start Date End Date Taking? Authorizing Provider  amoxicillin (AMOXIL) 500 MG tablet Take 2,000 mg by mouth as directed. 1 hr prior to dental procedure Take on 09/24/2018 (afternoon)    [provider]  apixaban (ELIQUIS) 5 MG TABS tablet Take 1 tablet (5 mg total) by mouth 2 (two) times daily. 08/10/18   Allred, Jeneen Rinks, MD  atorvastatin (LIPITOR) 10 MG tablet TAKE 1 TABLET BY MOUTH EVERYDAY AT BEDTIME Patient taking differently: Take 10 mg by mouth at bedtime.  08/26/18   Bedsole, Amy E, MD  bisacodyl (DULCOLAX) 10 MG suppository Place 1 suppository (10 mg total) rectally as needed for moderate constipation. 01/01/19   Lavonia Drafts, MD  Calcium Carb-Ergocalciferol 500-200 MG-UNIT TABS Take 1 tablet by mouth daily.  02/19/08   [provider]  diltiazem (CARDIZEM CD) 120 MG 24 hr capsule TAKE 1 CAPSULE BY MOUTH EVERY DAY 04/06/18   Allred, Jeneen Rinks, MD  escitalopram (LEXAPRO) 10 MG tablet Take 1 tablet by mouth once daily 09/21/18   Bedsole, Amy E, MD  flecainide (TAMBOCOR) 50 MG tablet Take 1 tablet (50 mg total) by mouth 2 (two) times daily. 09/15/18   Dunn, Areta Haber, PA-C  glucose blood test strip CHECK BLOOD SUGAR 2 TIMES A DAY AS DIRECTED DX CODE E11.9 05/19/18   [name], MD  Lactulose 20 GM/30ML SOLN Take 15 mLs (10 g total) by mouth daily. 01/01/19   Lavonia Drafts, MD  linagliptin (TRADJENTA) 5 MG TABS tablet Take 1 tablet (5 mg total) by mouth daily. 11/17/18   [name], MD  metFORMIN (GLUCOPHAGE-XR) 500 MG 24 hr tablet Take 2 tablets (1,000 mg total) by mouth 2 (two) times daily with  a meal. 09/25/18   [name], MD  Multiple Vitamin (MULTIVITAMIN) tablet Take 1 tablet by mouth daily.      [provider]  sennosides-docusate sodium (SENOKOT-S) 8.6-50 MG tablet Take 1 tablet by mouth daily as needed for constipation.    [provider]  TOVIAZ 4 MG TB24 tablet TAKE 1 TABLET BY MOUTH EVERY DAY 11/16/18   Bedsole, Amy E, MD  traZODone (DESYREL) 50 MG tablet TAKE 1-2 TABLETS (50-100 MG TOTAL) BY MOUTH AT BEDTIME AS NEEDED FOR SLEEP. Patient taking differently: Take 50 mg by mouth at bedtime.  11/06/17   Wilhelmina Mcardle, MD     Allergies Pravachol [pravastatin sodium] and Contrast media [iodinated diagnostic agents]  Family History  Problem Relation Age of Onset  . Cancer Mother        fallopian tube  . Dementia Mother   . Pulmonary embolism Mother   . Hyperlipidemia Mother   . Diabetes Mother   .  Hyperlipidemia Father   . Lung cancer Father   . Hypertension Sister   . Diabetes Brother   . Stroke Paternal Grandmother   . Stroke Paternal Grandfather   . Colon cancer Neg Hx   . Esophageal cancer Neg Hx     Social History Social History   Tobacco Use  . Smoking status: Never Smoker  . Smokeless tobacco: Never Used  Substance Use Topics  . Alcohol use: No    Alcohol/week: 0.0 standard drinks  . Drug use: No    Review of Systems  Constitutional: No fever/chills Eyes: No visual changes.  ENT: No sore throat. Cardiovascular: Denies chest pain. Respiratory: Denies shortness of breath. Gastrointestinal: As above Genitourinary: Negative for dysuria. Musculoskeletal: Negative for back pain. Skin: Negative for rash. Neurological: Negative for headaches or weakness   ____________________________________________   PHYSICAL EXAM:  VITAL SIGNS: ED Triage Vitals  Enc Vitals Group     BP 01/01/19 1027 (!) 123/98     Pulse Rate 01/01/19 1027 71     Resp 01/01/19 1027 18     Temp 01/01/19 1027 97.6 F (36.4 C)     Temp Source  01/01/19 1332 Oral     SpO2 01/01/19 1027 95 %     Weight 01/01/19 1030 74.4 kg (164 lb)     Height 01/01/19 1030 1.651 m (5\' 5" )     Head Circumference --      Peak Flow --      Pain Score 01/01/19 1043 5     Pain Loc --      Pain Edu? --      Excl. in [place]? --     Constitutional: Alert and oriented.  Mouth/Throat: Mucous membranes are moist.   Neck:  Painless ROM Cardiovascular: Normal rate, regular rhythm.  Good peripheral circulation. Respiratory: Normal respiratory effort.  No retractions.. Gastrointestinal: Soft and nontender. No distention.  No CVA tenderness.  Musculoskeletal: No lower extremity tenderness nor edema.  Warm and well perfused Neurologic:  Normal speech and language. No gross focal neurologic deficits are appreciated.  Skin:  Skin is warm, dry and intact. No rash noted. Psychiatric: Mood and affect are normal. Speech and behavior are normal.  ____________________________________________   LABS (all labs ordered are listed, but only abnormal results are displayed)  Labs Reviewed  COMPREHENSIVE METABOLIC PANEL - Abnormal; Notable for the following components:      Result Value   Sodium 130 (*)    Chloride 95 (*)    Glucose, Bld 137 (*)    GFR calc non Af Amer 60 (*)    All other components within normal limits  URINALYSIS, COMPLETE (UACMP) WITH MICROSCOPIC - Abnormal; Notable for the following components:   Color, Urine YELLOW (*)    APPearance HAZY (*)    Hgb urine dipstick SMALL (*)    Nitrite POSITIVE (*)    Leukocytes,Ua LARGE (*)    WBC, UA >50 (*)    Bacteria, UA FEW (*)    All other components within normal limits  LIPASE, BLOOD  CBC   ____________________________________________  EKG None ____________________________________________  RADIOLOGY  KUB reassuring ____________________________________________   PROCEDURES  Procedure(s) performed: No  Procedures   Critical Care performed: No  ____________________________________________   INITIAL IMPRESSION / ASSESSMENT AND PLAN / ED COURSE  Pertinent labs & imaging results that were available during my care of the patient were reviewed by me and considered in my medical decision making (see chart for details).  Patient presents with  constipation x5 days, is attempted over-the-counter medications without success.  No significant abdominal pain.  No nausea vomiting.  Exam is reassuring.  KUB is unremarkable.  Lab work is benign.  Patient has no dysuria  We will treat with lactulose, Dulcolax suppositories, MiraLAX as needed    ____________________________________________   FINAL CLINICAL IMPRESSION(S) / ED DIAGNOSES  Final diagnoses:  Constipation, unspecified constipation type        Note:  This document was prepared using Dragon voice recognition software and may include unintentional dictation errors.   Lavonia Drafts, MD 01/01/19 [phone]

## 2019-01-04 ENCOUNTER — Other Ambulatory Visit: Payer: Self-pay | Admitting: General Surgery

## 2019-01-04 ENCOUNTER — Ambulatory Visit: Payer: Medicare HMO | Admitting: Family Medicine

## 2019-01-04 DIAGNOSIS — K5900 Constipation, unspecified: Secondary | ICD-10-CM

## 2019-01-05 ENCOUNTER — Ambulatory Visit
Admission: RE | Admit: 2019-01-05 | Discharge: 2019-01-05 | Disposition: A | Discharge status: Home / Self Care | Payer: Medicare HMO | Source: Ambulatory Visit | Attending: General Surgery | Admitting: General Surgery

## 2019-01-05 ENCOUNTER — Other Ambulatory Visit: Payer: Self-pay | Admitting: General Surgery

## 2019-01-05 ENCOUNTER — Other Ambulatory Visit: Payer: Self-pay

## 2019-01-05 ENCOUNTER — Ambulatory Visit
Admission: RE | Admit: 2019-01-05 | Discharge: 2019-01-05 | Disposition: A | Discharge status: Home / Self Care | Payer: Medicare HMO | Attending: General Surgery | Admitting: General Surgery

## 2019-01-05 DIAGNOSIS — K5900 Constipation, unspecified: Secondary | ICD-10-CM | POA: Diagnosis not present

## 2019-01-05 DIAGNOSIS — N3000 Acute cystitis without hematuria: Secondary | ICD-10-CM

## 2019-01-05 MED ORDER — SULFAMETHOXAZOLE-TRIMETHOPRIM 800-160 MG PO TABS: 1.0000 | ORAL_TABLET | Freq: Two times a day (BID) | ORAL | 0 refills | Status: DC

## 2019-01-07 ENCOUNTER — Telehealth: Payer: Self-pay | Admitting: Pulmonary Disease

## 2019-01-07 MED ORDER — TRAZODONE HCL 50 MG PO TABS: 50.0000 mg | ORAL_TABLET | Freq: Every day | ORAL | 2 refills | Status: DC

## 2019-01-07 NOTE — Telephone Encounter (Signed)
Spoke with the pt  She is requesting a refill on her Trazodone 50 mg- she takes 1/2 to 1 tablet qhsprn  Last given #180 with 1 refill on 11/06/17  Former patient of Dr Alva Garnet Please advise if okay to send thanks

## 2019-01-07 NOTE — Telephone Encounter (Signed)
LMTCB

## 2019-01-07 NOTE — Telephone Encounter (Signed)
Rx was sent and pt aware  Nothing further needed 

## 2019-01-07 NOTE — Telephone Encounter (Signed)
Yes, ok to send

## 2019-01-15 ENCOUNTER — Telehealth (INDEPENDENT_AMBULATORY_CARE_PROVIDER_SITE_OTHER): Payer: Medicare HMO

## 2019-01-15 DIAGNOSIS — R8290 Unspecified abnormal findings in urine: Secondary | ICD-10-CM

## 2019-01-15 LAB — POC URINALSYSI DIPSTICK (AUTOMATED)
Bilirubin, UA: NEGATIVE
Blood, UA: NEGATIVE
Glucose, UA: NEGATIVE
Ketones, UA: NEGATIVE
Leukocytes, UA: NEGATIVE
Nitrite, UA: NEGATIVE
Protein, UA: NEGATIVE
Spec Grav, UA: 1.015 (ref 1.010–1.025)
Urobilinogen, UA: 0.2 E.U./dL
pH, UA: 6 (ref 5.0–8.0)

## 2019-01-15 NOTE — Addendum Note (Signed)
Addended by: Pilar Grammes on: 01/15/2019 04:30 PM   Modules accepted: Orders

## 2019-01-15 NOTE — Telephone Encounter (Signed)
No note from Dr. Bary Castilla.  Do UA and spin down for me.   Pt is asymptomatic. S/P treatment  With bactrim.[name] needs to be retreated.  If blatantly positive.. I will go ahead and treat given.

## 2019-01-15 NOTE — Telephone Encounter (Signed)
We found a urine sitting on the cart with hte patient's name on it with a time of 830 am.   After looking through her chart, I could not find anything that directed [name] to do testing on it.   I called the pt and she said Dr Bary Castilla told her she had had a kidney infection a weeks back and that she needed to have her urine checked. He was supposed to be sending a note to Dr Diona Browner.  The pt came by and picked up a cup and brought back the specimen this morning. No one advised [name] that it was on the cart.   Is this too old to test now?

## 2019-01-15 NOTE — Telephone Encounter (Signed)
I ran a UA and results have been entered. Urine is being spun now.

## 2019-01-15 NOTE — Telephone Encounter (Signed)
Let pt know urine is clear.. no infeciton.  Urinalysis    Component Value Date/Time   COLORURINE YELLOW (A) 01/01/2019 1033   APPEARANCEUR HAZY (A) 01/01/2019 1033   LABSPEC 1.010 01/01/2019 1033   PHURINE 5.0 01/01/2019 1033   GLUCOSEU NEGATIVE 01/01/2019 1033   HGBUR SMALL (A) 01/01/2019 1033   HGBUR large 02/12/2010 0900   BILIRUBINUR negative 01/15/2019 1628   KETONESUR NEGATIVE 01/01/2019 1033   PROTEINUR Negative 01/15/2019 1628   PROTEINUR NEGATIVE 01/01/2019 1033   UROBILINOGEN 0.2 01/15/2019 1628   UROBILINOGEN 0.2 07/02/2011 2353   NITRITE negative 01/15/2019 1628   NITRITE POSITIVE (A) 01/01/2019 1033   LEUKOCYTESUR Negative 01/15/2019 1628   LEUKOCYTESUR LARGE (A) 01/01/2019 1033

## 2019-01-18 NOTE — Telephone Encounter (Signed)
Spoke to pt

## 2019-01-21 ENCOUNTER — Other Ambulatory Visit: Payer: Self-pay | Admitting: Internal Medicine

## 2019-01-22 DIAGNOSIS — M9902 Segmental and somatic dysfunction of thoracic region: Secondary | ICD-10-CM | POA: Diagnosis not present

## 2019-01-22 DIAGNOSIS — M9901 Segmental and somatic dysfunction of cervical region: Secondary | ICD-10-CM | POA: Diagnosis not present

## 2019-01-22 DIAGNOSIS — M9907 Segmental and somatic dysfunction of upper extremity: Secondary | ICD-10-CM | POA: Diagnosis not present

## 2019-01-22 DIAGNOSIS — M19011 Primary osteoarthritis, right shoulder: Secondary | ICD-10-CM | POA: Diagnosis not present

## 2019-02-08 ENCOUNTER — Telehealth: Payer: Self-pay | Admitting: Cardiovascular Disease

## 2019-02-08 ENCOUNTER — Telehealth (HOSPITAL_COMMUNITY): Payer: Self-pay | Admitting: *Deleted

## 2019-02-08 NOTE — Telephone Encounter (Signed)
Talked with patient had a very stressful event that caused the afib episodes she believes she is going in and out of AF. When in AF her heart rates are 140s and her BP can run low. Discussed with Adline Peals PA will try increasing flecainide to 100mg  BID for 2 days then call with report. Pt verbalized understanding.

## 2019-02-08 NOTE — Telephone Encounter (Signed)
Incoming call returned from patient. She is followed by Dr Rayann Heman for ongoing atrial fib. I suggested that it would probably be best to reach out to their office for further advice given she is likely having complications r/t af.   Pt voiced understanding and I provided number to [facility] office.   Advised pt to call for any further questions or concerns.

## 2019-02-08 NOTE — Telephone Encounter (Signed)
Patient c/o Palpitations:  High priority if patient c/o lightheadedness, shortness of breath, or chest pain   1) How long have you had palpitations/irregular HR/ Afib? Are you having the symptoms now? Started Saturday - slight symptoms now  2) Are you currently experiencing lightheadedness, SOB or CP? A lot of lightheadedness, little chest discomfort - it was bad this weekend, not as bad now   3) Do you have a history of afib (atrial fibrillation) or irregular heart rhythm? Yes - had heart shocked 3 times and an ablation before   4) Have you checked your BP or HR? (document readings if available): this weekend it got to HR 144 - this morning HR 97  5) Are you experiencing any other symptoms? Just some lightheadedness   Call [phone]

## 2019-02-08 NOTE — Telephone Encounter (Signed)
error 

## 2019-02-08 NOTE — Telephone Encounter (Signed)
Attempted to call patient. LMTCB 02/08/2019   

## 2019-02-11 ENCOUNTER — Telehealth: Payer: Self-pay | Admitting: Internal Medicine

## 2019-02-11 DIAGNOSIS — R69 Illness, unspecified: Secondary | ICD-10-CM | POA: Diagnosis not present

## 2019-02-11 NOTE — Telephone Encounter (Signed)
This was started by EP/A. fib clinic.  Will defer medication changes to them.  Thanks.

## 2019-02-11 NOTE — Telephone Encounter (Signed)
New message    Pt c/o medication issue:  1. Name of Medication: flecainide (TAMBOCOR) 50 MG tablet  2. How are you currently taking this medication (dosage and times per day)? n/a  3. Are you having a reaction (difficulty breathing--STAT)? no  4. What is your medication issue? Patient calling to clarify dosage. States she feels much better. Should dosage decrease

## 2019-02-11 NOTE — Telephone Encounter (Signed)
Per Adline Peals PA - can reduce back to 50mg  dose BID if back in rhythm. Pt verbalizing understanding.

## 2019-02-11 NOTE — Telephone Encounter (Signed)
Thank you Thurmond Butts !! I did send to [employer] as well.

## 2019-02-16 DIAGNOSIS — M1990 Unspecified osteoarthritis, unspecified site: Secondary | ICD-10-CM | POA: Diagnosis not present

## 2019-02-16 DIAGNOSIS — Z7901 Long term (current) use of anticoagulants: Secondary | ICD-10-CM | POA: Diagnosis not present

## 2019-02-16 DIAGNOSIS — I4891 Unspecified atrial fibrillation: Secondary | ICD-10-CM | POA: Diagnosis not present

## 2019-02-16 DIAGNOSIS — Z7984 Long term (current) use of oral hypoglycemic drugs: Secondary | ICD-10-CM | POA: Diagnosis not present

## 2019-02-16 DIAGNOSIS — E119 Type 2 diabetes mellitus without complications: Secondary | ICD-10-CM | POA: Diagnosis not present

## 2019-02-16 DIAGNOSIS — E785 Hyperlipidemia, unspecified: Secondary | ICD-10-CM | POA: Diagnosis not present

## 2019-02-16 DIAGNOSIS — Z809 Family history of malignant neoplasm, unspecified: Secondary | ICD-10-CM | POA: Diagnosis not present

## 2019-02-16 DIAGNOSIS — D6869 Other thrombophilia: Secondary | ICD-10-CM | POA: Diagnosis not present

## 2019-02-16 DIAGNOSIS — R32 Unspecified urinary incontinence: Secondary | ICD-10-CM | POA: Diagnosis not present

## 2019-02-16 DIAGNOSIS — R69 Illness, unspecified: Secondary | ICD-10-CM | POA: Diagnosis not present

## 2019-02-18 ENCOUNTER — Ambulatory Visit
Admission: EM | Admit: 2019-02-18 | Discharge: 2019-02-18 | Disposition: A | Discharge status: Home / Self Care | Payer: Medicare HMO | Attending: Nurse Practitioner | Admitting: Nurse Practitioner

## 2019-02-18 ENCOUNTER — Other Ambulatory Visit: Payer: Self-pay

## 2019-02-18 ENCOUNTER — Other Ambulatory Visit: Payer: Self-pay | Admitting: Family Medicine

## 2019-02-18 ENCOUNTER — Encounter: Payer: Self-pay | Admitting: Emergency Medicine

## 2019-02-18 DIAGNOSIS — Z20828 Contact with and (suspected) exposure to other viral communicable diseases: Secondary | ICD-10-CM

## 2019-02-18 DIAGNOSIS — U071 COVID-19: Secondary | ICD-10-CM

## 2019-02-18 LAB — POC SARS CORONAVIRUS 2 AG -  ED: SARS Coronavirus 2 Ag: POSITIVE — AB

## 2019-02-18 NOTE — Discharge Instructions (Addendum)
You have COVID-19. There is no specific treatments for the virus.   Supportive care with tylenol and/or ibuprofen, rest and fluids is advised. You should remain in home isolation for at least 10 days since your symptoms started.  If you start to develop any serious symptoms such as chest pain or difficulty breathing, then call 911 or go to the emergency department. Please let them know immediately that you have tested positive for COVID-19.   You may discontinue home isolation under the following conditions:  1) At least 10 days* have passed since symptom onset;  2) At least 24 hours have passed since resolution of fever (> 100.4) without the use of fever-reducing medications;  3) Other symptoms have overall improve. All three criteria must be met to discontinue isolation.   

## 2019-02-18 NOTE — ED Provider Notes (Signed)
[name]    CSN: [id number] Arrival date & time: 02/18/19  1100      History   Chief Complaint Chief Complaint  Patient presents with  . Fatigue  . Sore Throat    HPI [name] is a 73 y.o. female.   Subjective:   [name] is a 73 y.o. female who presents for evaluation of COVID-like symptoms. Symptoms include sore throat, chills, dizziness and change in taste. Symptoms have been present for 1 week. She denies any fevers, body aches, cough, shortness of breath, chest pain, palpitations, nausea, vomiting, diarrhea, headache or change in smell.  She has tried to alleviate the symptoms with acetaminophen with moderate relief. She has had known contact with COVID-19 as her daughter who lives outside the home tested positive yesterday. High risk factors for COVID complications include age greater than 41 years of age and co-morbid illnesses.   The following portions of the patient's history were reviewed and updated as appropriate: allergies, current medications, past family history, past medical history, past social history, past surgical history and problem list.        Past Medical History:  Diagnosis Date  . Anemia   . Anxiety   . Colon polyps   . Constipation   . Depressive disorder, not elsewhere classified   . Diaphragmatic hernia without mention of obstruction or gangrene   . Disorders of bursae and tendons in shoulder region, unspecified   . Diverticulosis of colon (without mention of hemorrhage)   . External hemorrhoid   . Fundic gland polyps of stomach, benign   . GERD (gastroesophageal reflux disease)   . History of hiatal hernia   . HTN (hypertension)   . Hypothyroidism   . Internal hemorrhoids without mention of complication   . Moderate mitral regurgitation   . Obesity, unspecified   . OSA on CPAP    compliant with CPAP  . Other urinary incontinence   . Persistent atrial fibrillation ([place])   . Pure hypercholesterolemia   .  Type II diabetes mellitus ([place])   . Urinary frequency   . Urinary urgency   . Vitamin B 12 deficiency     Patient Active Problem List   Diagnosis Date Noted  . Atrial tachycardia ([place]) 09/15/2018  . 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 Vitamin B 12 deficiency   . Urge incontinence   . OSA on CPAP   . History of hiatal hernia   . GERD (gastroesophageal reflux disease)   . Anxiety   . Other constipation 01/02/2017  . Class 1 obesity with serious comorbidity and body mass index (BMI) of 30.0 to 30.9 in adult 01/02/2017  . Overweight (BMI 25.0-29.9) 12/19/2016  . Persistent atrial fibrillation ([employer]) 12/17/2016  . Vitamin D deficiency 11/20/2016  . Family history of ovarian cancer 05/24/2016  . Iron deficiency anemia   . Essential hypertension   . Preoperative evaluation to rule out surgical contraindication 05/16/2015  . Controlled diabetes mellitus type II without complication ([place]) 23/30/0762  . Mitral regurgitation 10/11/2013  . Incidental lung nodule 07/16/2011  . 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 SLEEP APNEA 10/25/2008  . Hypercholesterolemia 06/11/2006  . Depression, major, in remission ([place]) 06/11/2006  . HEMORRHOIDS, INTERNAL 06/11/2006  . DIVERTICULOSIS, COLON 06/11/2006  . STRESS INCONTINENCE 06/11/2006    Past Surgical History:  Procedure Laterality Date  . ABDOMINAL HYSTERECTOMY  1972   Have both ovaries  . ATRIAL FIBRILLATION ABLATION  12/17/2016  . ATRIAL FIBRILLATION ABLATION N/A 12/17/2016   Procedure: ATRIAL FIBRILLATION ABLATION;  Surgeon: Thompson Grayer, MD;  Location: [facility];  Service: Cardiovascular;  Laterality: N/A;  . [place]   [facility]; "Dr. [name]"  . CARDIOVERSION N/A 05/27/2016   Procedure: CARDIOVERSION;  Surgeon: [name], MD;  Location: [facility];  Service: Cardiovascular;  Laterality: N/A;  . CARDIOVERSION N/A 11/25/2016   Procedure: CARDIOVERSION;  Surgeon: [name], MD;  Location: [facility];  Service: Cardiovascular;  Laterality: N/A;  . CARDIOVERSION N/A 09/18/2018   Procedure: CARDIOVERSION;  Surgeon: [name], MD;  Location: [facility];  Service: Cardiovascular;  Laterality: N/A;  . CATARACT EXTRACTION W/ INTRAOCULAR LENS  IMPLANT, BILATERAL Bilateral   . COLONOSCOPY    . ELECTROPHYSIOLOGIC STUDY N/A 01/24/2016   Procedure: CARDIOVERSION;  Surgeon: [name], MD;  Location: [facility];  Service: Cardiovascular;  Laterality: N/A;  . LAPAROSCOPIC CHOLECYSTECTOMY    . REDUCTION MAMMAPLASTY Bilateral   . TONSILLECTOMY    . TOTAL HIP ARTHROPLASTY Right 05/12/2018   Procedure: TOTAL HIP ARTHROPLASTY ANTERIOR APPROACH;  Surgeon: Paralee Cancel, MD;  Location: [facility];  Service: Orthopedics;  Laterality: Right;  70 mins    OB History    Gravida  2   Para      Term      Preterm      AB      Living  2     SAB      TAB      Ectopic      Multiple      Live Births               Home Medications    Prior to Admission medications   Medication Sig Start Date End Date Taking? Authorizing Provider  apixaban (ELIQUIS) 5 MG TABS tablet Take 1 tablet (5 mg total) by mouth 2 (two) times daily. 08/10/18   Allred, Jeneen Rinks, MD  atorvastatin (LIPITOR) 10 MG tablet TAKE 1 TABLET BY MOUTH EVERYDAY AT BEDTIME Patient taking differently: Take 10 mg by mouth at bedtime.  08/26/18   Bedsole, Amy E, MD  bisacodyl (DULCOLAX) 10 MG suppository Place 1 suppository (10 mg total) rectally as needed for moderate constipation. 01/01/19   Lavonia Drafts, MD  Calcium Carb-Ergocalciferol 500-200 MG-UNIT TABS Take 1 tablet by mouth daily.  02/19/08   [provider]  diltiazem (CARDIZEM CD) 120 MG 24 hr capsule TAKE 1 CAPSULE BY MOUTH EVERY DAY 04/06/18   Allred, Jeneen Rinks, MD  escitalopram (LEXAPRO) 10 MG tablet Take 1 tablet by mouth once daily 09/21/18   Bedsole, Amy E, MD  flecainide (TAMBOCOR) 50 MG tablet Take 1 tablet (50 mg total) by mouth  2 (two) times daily. 09/15/18   Dunn, Areta Haber, PA-C  glucose blood test strip CHECK BLOOD SUGAR 2 TIMES A DAY AS DIRECTED DX CODE E11.9 05/19/18   [name], MD  linagliptin (TRADJENTA) 5 MG TABS tablet Take 1 tablet (5 mg total) by mouth daily. 11/17/18   [name], MD  metFORMIN (GLUCOPHAGE-XR) 500 MG 24 hr tablet TAKE 2 TABLETS (1,000 MG TOTAL) BY MOUTH 2 (TWO) TIMES DAILY WITH A MEAL. 01/22/19   [name], MD  Multiple Vitamin (MULTIVITAMIN) tablet Take 1 tablet by mouth daily.      [provider]  TOVIAZ 4 MG TB24 tablet TAKE 1 TABLET BY MOUTH EVERY DAY 02/18/19   Jinny Sanders, MD  traZODone (DESYREL) 50  MG tablet Take 1 tablet (50 mg total) by mouth at bedtime. 01/07/19   Flora Lipps, MD    Family History Family History  Problem Relation Age of Onset  . Cancer Mother        fallopian tube  . Dementia Mother   . Pulmonary embolism Mother   . Hyperlipidemia Mother   . Diabetes Mother   . Hyperlipidemia Father   . Lung cancer Father   . Hypertension Sister   . Diabetes Brother   . Stroke Paternal Grandmother   . Stroke Paternal Grandfather   . Colon cancer Neg Hx   . Esophageal cancer Neg Hx     Social History Social History   Tobacco Use  . Smoking status: Never Smoker  . Smokeless tobacco: Never Used  Substance Use Topics  . Alcohol use: No    Alcohol/week: 0.0 standard drinks  . Drug use: No     Allergies   Pravachol [pravastatin sodium] and Contrast media [iodinated diagnostic agents]   Review of Systems Review of Systems  Constitutional: Positive for chills. Negative for fever.  HENT: Positive for sore throat.   Respiratory: Negative for cough and shortness of breath.   Cardiovascular: Negative for chest pain and palpitations.  Gastrointestinal: Negative for diarrhea, nausea and vomiting.  Musculoskeletal: Negative for myalgias.  Neurological: Positive for dizziness. Negative for headaches.  All other systems reviewed and  are negative.    Physical Exam Triage Vital Signs ED Triage Vitals  Enc Vitals Group     BP 02/18/19 1110 138/62     Pulse Rate 02/18/19 1110 72     Resp 02/18/19 1110 18     Temp 02/18/19 1110 98.2 F (36.8 C)     Temp Source 02/18/19 1110 Oral     SpO2 02/18/19 1110 97 %     Weight 02/18/19 1114 157 lb (71.2 kg)     Height --      Head Circumference --      Peak Flow --      Pain Score --      Pain Loc --      Pain Edu? --      Excl. in [place]? --    No data found.  Updated Vital Signs BP 138/62   Pulse 72   Temp 98.2 F (36.8 C) (Oral)   Resp 18   Wt 157 lb (71.2 kg)   LMP  (LMP Unknown)   SpO2 97%   BMI 26.13 kg/m   Visual Acuity Right Eye Distance:   Left Eye Distance:   Bilateral Distance:    Right Eye Near:   Left Eye Near:    Bilateral Near:     Physical Exam Vitals reviewed.  Constitutional:      Appearance: She is well-developed.  HENT:     Head: Normocephalic.     Mouth/Throat:     Mouth: Mucous membranes are moist.     Pharynx: No posterior oropharyngeal erythema.  Eyes:     Conjunctiva/sclera: Conjunctivae normal.  Cardiovascular:     Rate and Rhythm: Normal rate and regular rhythm.     Heart sounds: Normal heart sounds.  Pulmonary:     Effort: Pulmonary effort is normal.     Breath sounds: Normal breath sounds.  Musculoskeletal:     Cervical back: Normal range of motion and neck supple.  Lymphadenopathy:     Cervical: No cervical adenopathy.  Skin:    General: Skin is warm and dry.  Neurological:  General: No focal deficit present.     Mental Status: She is alert and oriented to person, place, and time.  Psychiatric:        Mood and Affect: Mood normal.        Behavior: Behavior normal.      UC Treatments / Results  Labs (all labs ordered are listed, but only abnormal results are displayed) Labs Reviewed  POC SARS CORONAVIRUS 2 AG -  ED - Abnormal; Notable for the following components:      Result Value   SARS  Coronavirus 2 Ag Positive (*)    All other components within normal limits    EKG   Radiology No results found.  Procedures Procedures (including critical care time)  Medications Ordered in UC Medications - No data to display  Initial Impression / Assessment and Plan / UC Course  I have reviewed the triage vital signs and the nursing notes.  Pertinent labs & imaging results that were available during my care of the patient were reviewed by me and considered in my medical decision making (see chart for details).    73 yo female with multiple chronic medical problems presents with a one-week history of chills, dizziness, change in taste and sore throat. She's also had known COVID 19 exposure. Patient in AAOx3. Nontoxic appearing. Physical exam unremarkable. Rapid COVID test POSITIVE.  Supportive care with appropriate antipyretics and fluids. Educational material distributed and questions answered.  Today's evaluation has revealed no signs of a dangerous process. Discussed diagnosis with patient and/or guardian. Patient and/or guardian aware of their diagnosis, possible red flag symptoms to watch out for and need for close follow up. Patient and/or guardian understands verbal and written discharge instructions. Patient and/or guardian comfortable with plan and disposition.  Patient and/or guardian has a clear mental status at this time, good insight into illness (after discussion and teaching) and has clear judgment to make decisions regarding their care  This care was provided during an unprecedented National Emergency due to the Novel Coronavirus (COVID-19) pandemic. COVID-19 infections and transmission risks place heavy strains on healthcare resources.  As this pandemic evolves, our facility, providers, and staff strive to respond fluidly, to remain operational, and to provide care relative to available resources and information. Outcomes are unpredictable and treatments are without  well-defined guidelines. Further, the impact of COVID-19 on all aspects of urgent care, including the impact to patients seeking care for reasons other than COVID-19, is unavoidable during this national emergency. At this time of the global pandemic, management of patients has significantly changed, even for non-COVID positive patients given high local and regional COVID volumes at this time requiring high healthcare system and resource utilization. The standard of care for management of both COVID suspected and non-COVID suspected patients continues to change rapidly at the local, regional, national, and global levels. This patient was worked up and treated to the best available but ever changing evidence and resources available at this current time.   Documentation was completed with the aid of voice recognition software. Transcription may contain typographical errors.   Final Clinical Impressions(s) / UC Diagnoses   Final diagnoses:  Exposure to SARS-associated coronavirus  Clinical diagnosis of COVID-19     Discharge Instructions     You have COVID-19. There is no specific treatments for the virus.   Supportive care with tylenol and/or ibuprofen, rest and fluids is advised. You should remain in home isolation for at least 10 days since your symptoms started.  If  you start to develop any serious symptoms such as chest pain or difficulty breathing, then call 911 or go to the emergency department. Please let them know immediately that you have tested positive for COVID-19.   You may discontinue home isolation under the following conditions:  1) At least 10 days* have passed since symptom onset;  2) At least 24 hours have passed since resolution of fever (> 100.4) without the use of fever-reducing medications;  3) Other symptoms have overall improve. All three criteria must be met to discontinue isolation.      ED Prescriptions    None     PDMP not reviewed this encounter.   Enrique Sack, [place] 02/18/19 1135

## 2019-02-18 NOTE — ED Triage Notes (Signed)
Patient in office today c/o fatigue sore throat and had covid exposure  [id number]

## 2019-02-19 ENCOUNTER — Telehealth: Payer: Self-pay | Admitting: Unknown Physician Specialty

## 2019-02-19 ENCOUNTER — Other Ambulatory Visit: Payer: Self-pay | Admitting: Unknown Physician Specialty

## 2019-02-19 ENCOUNTER — Ambulatory Visit: Payer: Medicare HMO | Admitting: Internal Medicine

## 2019-02-19 DIAGNOSIS — U071 COVID-19: Secondary | ICD-10-CM

## 2019-02-19 NOTE — Telephone Encounter (Signed)
  I connected by phone with [name] on 02/19/2019 at 12:10 PM to discuss the potential use of an new treatment for mild to moderate COVID-19 viral infection in non-hospitalized patients.  This patient is a 73 y.o. female that meets the FDA criteria for Emergency Use Authorization of bamlanivimab or casirivimab\imdevimab.  Has a (+) direct SARS-CoV-2 viral test result  Has mild or moderate COVID-19   Is ? 73 years of age and weighs ? 40 kg  Is NOT hospitalized due to COVID-19  Is NOT requiring oxygen therapy or requiring an increase in baseline oxygen flow rate due to COVID-19  Is within 10 days of symptom onset  Has at least one of the high risk factor(s) for progression to severe COVID-19 and/or hospitalization as defined in EUA.  Specific high risk criteria : >/= 71 yo   I have spoken and communicated the following to the patient or parent/caregiver:  1. FDA has authorized the emergency use of bamlanivimab and casirivimab\imdevimab for the treatment of mild to moderate COVID-19 in adults and pediatric patients with positive results of direct SARS-CoV-2 viral testing who are 18 years of age and older weighing at least 40 kg, and who are at high risk for progressing to severe COVID-19 and/or hospitalization.  2. The significant known and potential risks and benefits of bamlanivimab and casirivimab\imdevimab, and the extent to which such potential risks and benefits are unknown.  3. Information on available alternative treatments and the risks and benefits of those alternatives, including clinical trials.  4. Patients treated with bamlanivimab and casirivimab\imdevimab should continue to self-isolate and use infection control measures (e.g., wear mask, isolate, social distance, avoid sharing personal items, clean and disinfect "high touch" surfaces, and frequent handwashing) according to CDC guidelines.   5. The patient or parent/caregiver has the option to accept or refuse  bamlanivimab or casirivimab\imdevimab .  After reviewing this information with the patient, The patient agreed to proceed with receiving the bamlanimivab infusion and will be provided a copy of the Fact sheet prior to receiving the infusion.[name] 02/19/2019 12:10 PM

## 2019-02-19 NOTE — Progress Notes (Signed)
  I connected by phone with [name] on 02/19/2019 at 12:14 PM to discuss the potential use of an new treatment for mild to moderate COVID-19 viral infection in non-hospitalized patients.  This patient is a 73 y.o. female that meets the FDA criteria for Emergency Use Authorization of bamlanivimab or casirivimab\imdevimab.  Has a (+) direct SARS-CoV-2 viral test result  Has mild or moderate COVID-19   Is ? 73 years of age and weighs ? 40 kg  Is NOT hospitalized due to COVID-19  Is NOT requiring oxygen therapy or requiring an increase in baseline oxygen flow rate due to COVID-19  Is within 10 days of symptom onset  Has at least one of the high risk factor(s) for progression to severe COVID-19 and/or hospitalization as defined in EUA.  Specific high risk criteria : >/= 71 yo   I have spoken and communicated the following to the patient or parent/caregiver:  1. FDA has authorized the emergency use of bamlanivimab and casirivimab\imdevimab for the treatment of mild to moderate COVID-19 in adults and pediatric patients with positive results of direct SARS-CoV-2 viral testing who are 55 years of age and older weighing at least 40 kg, and who are at high risk for progressing to severe COVID-19 and/or hospitalization.  2. The significant known and potential risks and benefits of bamlanivimab and casirivimab\imdevimab, and the extent to which such potential risks and benefits are unknown.  3. Information on available alternative treatments and the risks and benefits of those alternatives, including clinical trials.  4. Patients treated with bamlanivimab and casirivimab\imdevimab should continue to self-isolate and use infection control measures (e.g., wear mask, isolate, social distance, avoid sharing personal items, clean and disinfect "high touch" surfaces, and frequent handwashing) according to CDC guidelines.   5. The patient or parent/caregiver has the option to accept or refuse  bamlanivimab or casirivimab\imdevimab .  After reviewing this information with the patient, The patient agreed to proceed with receiving the bamlanimivab infusion and will be provided a copy of the Fact sheet prior to receiving the infusion.[name] 02/19/2019 12:14 PM

## 2019-02-22 ENCOUNTER — Ambulatory Visit (HOSPITAL_COMMUNITY)
Admission: RE | Admit: 2019-02-22 | Discharge: 2019-02-22 | Disposition: A | Discharge status: Home / Self Care | Payer: Medicare Other | Source: Ambulatory Visit | Attending: Pulmonary Disease | Admitting: Pulmonary Disease

## 2019-02-22 ENCOUNTER — Encounter (HOSPITAL_COMMUNITY): Payer: Self-pay

## 2019-02-22 DIAGNOSIS — U071 COVID-19: Secondary | ICD-10-CM

## 2019-02-22 DIAGNOSIS — Z23 Encounter for immunization: Secondary | ICD-10-CM | POA: Insufficient documentation

## 2019-02-22 MED ORDER — SODIUM CHLORIDE 0.9 % IV SOLN
INTRAVENOUS | Status: DC | PRN
  Administered 2019-02-22: 250 mL via INTRAVENOUS

## 2019-02-22 MED ORDER — DIPHENHYDRAMINE HCL 50 MG/ML IJ SOLN: 50.0000 mg | Freq: Once | INTRAMUSCULAR | Status: DC | PRN

## 2019-02-22 MED ORDER — FAMOTIDINE IN NACL 20-0.9 MG/50ML-% IV SOLN: 20.0000 mg | Freq: Once | INTRAVENOUS | Status: DC | PRN

## 2019-02-22 MED ORDER — ALBUTEROL SULFATE HFA 108 (90 BASE) MCG/ACT IN AERS: 2.0000 | INHALATION_SPRAY | Freq: Once | RESPIRATORY_TRACT | Status: DC | PRN

## 2019-02-22 MED ORDER — SODIUM CHLORIDE 0.9 % IV SOLN
700.0000 mg | Freq: Once | INTRAVENOUS | Status: AC
  Administered 2019-02-22: 700 mg via INTRAVENOUS
  Filled 2019-02-22: qty 20

## 2019-02-22 MED ORDER — EPINEPHRINE 0.3 MG/0.3ML IJ SOAJ: 0.3000 mg | Freq: Once | INTRAMUSCULAR | Status: DC | PRN

## 2019-02-22 MED ORDER — METHYLPREDNISOLONE SODIUM SUCC 125 MG IJ SOLR: 125.0000 mg | Freq: Once | INTRAMUSCULAR | Status: DC | PRN

## 2019-02-22 NOTE — Progress Notes (Signed)
  Diagnosis: COVID-19  Physician: Dr. Joya Gaskins  Procedure: [facility] Med: bamlanivimab infusion - Provided patient with bamlanimivab fact sheet for patients, parents and caregivers prior to infusion.  Complications: No immediate complications noted.  Discharge: Discharged home   [facility] 02/22/2019

## 2019-02-22 NOTE — Discharge Instructions (Signed)
Prevent the Spread of COVID-19 if You Are Sick If you are sick with COVID-19 or think you might have COVID-19, follow the steps below to help protect other people in your home and community. Stay home except to get medical care.  Stay home. Most people with COVID-19 have mild illness and are able to recover at home without medical care. Do not leave your home, except to get medical care. Do not visit public areas.  Take care of yourself. Get rest and stay hydrated.  Get medical care when needed. Call your doctor before you go to their office for care. But, if you have trouble breathing or other concerning symptoms, call 911 for immediate help.  Avoid public transportation, ride-sharing, or taxis. Separate yourself from other people and pets in your home.  As much as possible, stay in a specific room and away from other people and pets in your home. Also, you should use a separate bathroom, if available. If you need to be around other people or animals in or outside of the home, wear a cloth face covering. ? See COVID-19 and Animals if you have questions about pets: [email or web address].thomas.biz/ Monitor your symptoms.  Common symptoms of COVID-19 include fever and cough. Trouble breathing is a more serious symptom that means you should get medical attention.  Follow care instructions from your healthcare provider and local health department. Your local health authorities will give instructions on checking your symptoms and reporting information. If you develop emergency warning signs for COVID-19 get medical attention immediately.  Emergency warning signs include*:  Trouble breathing  Persistent pain or pressure in the chest  New confusion or not able to be woken  Bluish lips or face *This list is not all inclusive. Please consult your medical provider for any other symptoms that are severe or concerning to you. Call 911 if you have a medical  emergency. If you have a medical emergency and need to call 911, notify the operator that you have or think you might have, COVID-19. If possible, put on a facemask before medical help arrives. Call ahead before visiting your doctor.  Call ahead. Many medical visits for routine care are being postponed or done by phone or telemedicine.  If you have a medical appointment that cannot be postponed, call your doctor's office. This will help the office protect themselves and other patients. If you are sick, wear a cloth covering over your nose and mouth.  You should wear a cloth face covering over your nose and mouth if you must be around other people or animals, including pets (even at home).  You don't need to wear the cloth face covering if you are alone. If you can't put on a cloth face covering (because of trouble breathing for example), cover your coughs and sneezes in some other way. Try to stay at least 6 feet away from other people. This will help protect the people around you. Note: During the COVID-19 pandemic, medical grade facemasks are reserved for healthcare workers and some first responders. You may need to make a cloth face covering using a scarf or bandana. Cover your coughs and sneezes.  Cover your mouth and nose with a tissue when you cough or sneeze.  Throw used tissues in a lined trash can.  Immediately wash your hands with soap and water for at least 20 seconds. If soap and water are not available, clean your hands with an alcohol-based hand sanitizer that contains at least 60% alcohol. Clean your hands often.  Wash your hands often with soap and water for at least 20 seconds. This is especially important after blowing your nose, coughing, or sneezing; going to the bathroom; and before eating or preparing food.  Use hand sanitizer if soap and water are not available. Use an alcohol-based hand sanitizer with at least 60% alcohol, covering all surfaces of your hands and rubbing  them together until they feel dry.  Soap and water are the best option, especially if your hands are visibly dirty.  Avoid touching your eyes, nose, and mouth with unwashed hands. Avoid sharing personal household items.  Do not share dishes, drinking glasses, cups, eating utensils, towels, or bedding with other people in your home.  Wash these items thoroughly after using them with soap and water or put them in the dishwasher. Clean all "high-touch" surfaces everyday.  Clean and disinfect high-touch surfaces in your "sick room" and bathroom. Let someone else clean and disinfect surfaces in common areas, but not your bedroom and bathroom.  If a caregiver or other person needs to clean and disinfect a sick person's bedroom or bathroom, they should do so on an as-needed basis. The caregiver/other person should wear a mask and wait as long as possible after the sick person has used the bathroom. High-touch surfaces include phones, remote controls, counters, tabletops, doorknobs, bathroom fixtures, toilets, keyboards, tablets, and bedside tables.  Clean and disinfect areas that may have blood, stool, or body fluids on them.  Use household cleaners and disinfectants. Clean the area or item with soap and water or another detergent if it is dirty. Then use a household disinfectant. ? Be sure to follow the instructions on the label to ensure safe and effective use of the product. Many products recommend keeping the surface wet for several minutes to ensure germs are killed. Many also recommend precautions such as wearing gloves and making sure you have good ventilation during use of the product. ? Most EPA-registered household disinfectants should be effective. How to discontinue home isolation  People with COVID-19 who have stayed home (home isolated) can stop home isolation under the following conditions: ? If you will not have a test to determine if you are still contagious, you can leave home  after these three things have happened:  You have had no fever for at least 72 hours (that is three full days of no fever without the use of medicine that reduces fevers) AND  other symptoms have improved (for example, when your cough or shortness of breath has improved) AND  at least 10 days have passed since your symptoms first appeared. ? If you will be tested to determine if you are still contagious, you can leave home after these three things have happened:  You no longer have a fever (without the use of medicine that reduces fevers) AND  other symptoms have improved (for example, when your cough or shortness of breath has improved) AND  you received two negative tests in a row, 24 hours apart. Your doctor will follow CDC guidelines. In all cases, follow the guidance of your healthcare provider and local health department. The decision to stop home isolation should be made in consultation with your healthcare provider and state and local health departments. Local decisions depend on local circumstances. [email or web address] 07/05/2018 This information is not intended to replace advice given to you by your health care provider. Make sure you discuss any questions you have with your health care provider. Document Released: 06/16/2018 Document Revised: 07/15/2018 Document Reviewed: 06/16/2018  Elsevier Patient Education  [employer].   COVID-19 COVID-19 is a respiratory infection that is caused by a virus called severe acute respiratory syndrome coronavirus 2 (SARS-CoV-2). The disease is also known as coronavirus disease or novel coronavirus. In some people, the virus may not cause any symptoms. In others, it may cause a serious infection. The infection can get worse quickly and can lead to complications, such as:  Pneumonia, or infection of the lungs.  Acute respiratory distress syndrome or ARDS. This is fluid build-up in the lungs.  Acute respiratory failure. This is a condition  in which there is not enough oxygen passing from the lungs to the body.  Sepsis or septic shock. This is a serious bodily reaction to an infection.  Blood clotting problems.  Secondary infections due to bacteria or fungus. The virus that causes COVID-19 is contagious. This means that it can spread from person to person through droplets from coughs and sneezes (respiratory secretions). What are the causes? This illness is caused by a virus. You may catch the virus by:  Breathing in droplets from an infected person's cough or sneeze.  Touching something, like a table or a doorknob, that was exposed to the virus (contaminated) and then touching your mouth, nose, or eyes. What increases the risk? Risk for infection You are more likely to be infected with this virus if you:  Live in or travel to an area with a COVID-19 outbreak.  Come in contact with a sick person who recently traveled to an area with a COVID-19 outbreak.  Provide care for or live with a person who is infected with COVID-19. Risk for serious illness You are more likely to become seriously ill from the virus if you:  Are 16 years of age or older.  Have a long-term disease that lowers your body's ability to fight infection (immunocompromised).  Live in a nursing home or long-term care facility.  Have a long-term (chronic) disease such as: ? Chronic lung disease, including chronic obstructive pulmonary disease or asthma ? Heart disease. ? Diabetes. ? Chronic kidney disease. ? Liver disease.  Are obese. What are the signs or symptoms? Symptoms of this condition can range from mild to severe. Symptoms may appear any time from 2 to 14 days after being exposed to the virus. They include:  A fever.  A cough.  Difficulty breathing.  Chills.  Muscle pains.  A sore throat.  Loss of taste or smell. Some people may also have stomach problems, such as nausea, vomiting, or diarrhea. Other people may not have any  symptoms of COVID-19. How is this diagnosed? This condition may be diagnosed based on:  Your signs and symptoms, especially if: ? You live in an area with a COVID-19 outbreak. ? You recently traveled to or from an area where the virus is common. ? You provide care for or live with a person who was diagnosed with COVID-19.  A physical exam.  Lab tests, which may include: ? A nasal swab to take a sample of fluid from your nose. ? A throat swab to take a sample of fluid from your throat. ? A sample of mucus from your lungs (sputum). ? Blood tests.  Imaging tests, which may include, X-rays, CT scan, or ultrasound. How is this treated? At present, there is no medicine to treat COVID-19. Medicines that treat other diseases are being used on a trial basis to see if they are effective against COVID-19. Your health care provider will talk  with you about ways to treat your symptoms. For most people, the infection is mild and can be managed at home with rest, fluids, and over-the-counter medicines. Treatment for a serious infection usually takes places in a hospital intensive care unit (ICU). It may include one or more of the following treatments. These treatments are given until your symptoms improve.  Receiving fluids and medicines through an IV.  Supplemental oxygen. Extra oxygen is given through a tube in the nose, a face mask, or a hood.  Positioning you to lie on your stomach (prone position). This makes it easier for oxygen to get into the lungs.  Continuous positive airway pressure (CPAP) or bi-level positive airway pressure (BPAP) machine. This treatment uses mild air pressure to keep the airways open. A tube that is connected to a motor delivers oxygen to the body.  Ventilator. This treatment moves air into and out of the lungs by using a tube that is placed in your windpipe.  Tracheostomy. This is a procedure to create a hole in the neck so that a breathing tube can be  inserted.  Extracorporeal membrane oxygenation (ECMO). This procedure gives the lungs a chance to recover by taking over the functions of the heart and lungs. It supplies oxygen to the body and removes carbon dioxide. Follow these instructions at home: Lifestyle  If you are sick, stay home except to get medical care. Your health care provider will tell you how long to stay home. Call your health care provider before you go for medical care.  Rest at home as told by your health care provider.  Do not use any products that contain nicotine or tobacco, such as cigarettes, e-cigarettes, and chewing tobacco. If you need help quitting, ask your health care provider.  Return to your normal activities as told by your health care provider. Ask your health care provider what activities are safe for you. General instructions  Take over-the-counter and prescription medicines only as told by your health care provider.  Drink enough fluid to keep your urine pale yellow.  Keep all follow-up visits as told by your health care provider. This is important. How is this prevented?  There is no vaccine to help prevent COVID-19 infection. However, there are steps you can take to protect yourself and others from this virus. To protect yourself:   Do not travel to areas where COVID-19 is a risk. The areas where COVID-19 is reported change often. To identify high-risk areas and travel restrictions, check the CDC travel website: FatFares.com.br  If you live in, or must travel to, an area where COVID-19 is a risk, take precautions to avoid infection. ? Stay away from people who are sick. ? Wash your hands often with soap and water for 20 seconds. If soap and water are not available, use an alcohol-based hand sanitizer. ? Avoid touching your mouth, face, eyes, or nose. ? Avoid going out in public, follow guidance from your state and local health authorities. ? If you must go out in public, wear a  cloth face covering or face mask. ? Disinfect objects and surfaces that are frequently touched every day. This may include:  Counters and tables.  Doorknobs and light switches.  Sinks and faucets.  Electronics, such as phones, remote controls, keyboards, computers, and tablets. To protect others: If you have symptoms of COVID-19, take steps to prevent the virus from spreading to others.  If you think you have a COVID-19 infection, contact your health care provider right away.  Tell your health care team that you think you may have a COVID-19 infection.  Stay home. Leave your house only to seek medical care. Do not use public transport.  Do not travel while you are sick.  Wash your hands often with soap and water for 20 seconds. If soap and water are not available, use alcohol-based hand sanitizer.  Stay away from other members of your household. Let healthy household members care for children and pets, if possible. If you have to care for children or pets, wash your hands often and wear a mask. If possible, stay in your own room, separate from others. Use a different bathroom.  Make sure that all people in your household wash their hands well and often.  Cough or sneeze into a tissue or your sleeve or elbow. Do not cough or sneeze into your hand or into the air.  Wear a cloth face covering or face mask. Where to find more information  Centers for Disease Control and Prevention: PurpleGadgets.be  World Health Organization: https://www.castaneda.info/ Contact a health care provider if:  You live in or have traveled to an area where COVID-19 is a risk and you have symptoms of the infection.  You have had contact with someone who has COVID-19 and you have symptoms of the infection. Get help right away if:  You have trouble breathing.  You have pain or pressure in your chest.  You have confusion.  You have bluish lips and  fingernails.  You have difficulty waking from sleep.  You have symptoms that get worse. These symptoms may represent a serious problem that is an emergency. Do not wait to see if the symptoms will go away. Get medical help right away. Call your local emergency services (911 in the [place] Do not drive yourself to the hospital. Let the emergency medical personnel know if you think you have COVID-19. Summary  COVID-19 is a respiratory infection that is caused by a virus. It is also known as coronavirus disease or novel coronavirus. It can cause serious infections, such as pneumonia, acute respiratory distress syndrome, acute respiratory failure, or sepsis.  The virus that causes COVID-19 is contagious. This means that it can spread from person to person through droplets from coughs and sneezes.  You are more likely to develop a serious illness if you are 40 years of age or older, have a weak immunity, live in a nursing home, or have chronic disease.  There is no medicine to treat COVID-19. Your health care provider will talk with you about ways to treat your symptoms.  Take steps to protect yourself and others from infection. Wash your hands often and disinfect objects and surfaces that are frequently touched every day. Stay away from people who are sick and wear a mask if you are sick. This information is not intended to replace advice given to you by your health care provider. Make sure you discuss any questions you have with your health care provider. Document Released: 03/26/2018 Document Revised: 07/16/2018 Document Reviewed: 03/26/2018 Elsevier Patient Education  2020 [employer].

## 2019-03-02 ENCOUNTER — Ambulatory Visit (HOSPITAL_COMMUNITY)
Admission: RE | Admit: 2019-03-02 | Discharge: 2019-03-02 | Disposition: A | Discharge status: Home / Self Care | Payer: Medicare HMO | Source: Ambulatory Visit | Attending: Nurse Practitioner | Admitting: Nurse Practitioner

## 2019-03-02 ENCOUNTER — Encounter (HOSPITAL_COMMUNITY): Payer: Self-pay | Admitting: Nurse Practitioner

## 2019-03-02 ENCOUNTER — Other Ambulatory Visit: Payer: Self-pay

## 2019-03-02 VITALS — BP 106/64 | HR 85 | Ht 65.0 in | Wt 162.8 lb

## 2019-03-02 DIAGNOSIS — E039 Hypothyroidism, unspecified: Secondary | ICD-10-CM | POA: Insufficient documentation

## 2019-03-02 DIAGNOSIS — Z7984 Long term (current) use of oral hypoglycemic drugs: Secondary | ICD-10-CM | POA: Diagnosis not present

## 2019-03-02 DIAGNOSIS — G4733 Obstructive sleep apnea (adult) (pediatric): Secondary | ICD-10-CM | POA: Insufficient documentation

## 2019-03-02 DIAGNOSIS — I340 Nonrheumatic mitral (valve) insufficiency: Secondary | ICD-10-CM | POA: Diagnosis not present

## 2019-03-02 DIAGNOSIS — Z79899 Other long term (current) drug therapy: Secondary | ICD-10-CM | POA: Diagnosis not present

## 2019-03-02 DIAGNOSIS — D6869 Other thrombophilia: Secondary | ICD-10-CM

## 2019-03-02 DIAGNOSIS — Z8619 Personal history of other infectious and parasitic diseases: Secondary | ICD-10-CM | POA: Insufficient documentation

## 2019-03-02 DIAGNOSIS — I10 Essential (primary) hypertension: Secondary | ICD-10-CM | POA: Diagnosis not present

## 2019-03-02 DIAGNOSIS — I4819 Other persistent atrial fibrillation: Secondary | ICD-10-CM

## 2019-03-02 DIAGNOSIS — Z7901 Long term (current) use of anticoagulants: Secondary | ICD-10-CM | POA: Diagnosis not present

## 2019-03-02 DIAGNOSIS — Z823 Family history of stroke: Secondary | ICD-10-CM | POA: Insufficient documentation

## 2019-03-02 DIAGNOSIS — I480 Paroxysmal atrial fibrillation: Secondary | ICD-10-CM | POA: Diagnosis not present

## 2019-03-02 DIAGNOSIS — Z833 Family history of diabetes mellitus: Secondary | ICD-10-CM | POA: Insufficient documentation

## 2019-03-02 DIAGNOSIS — E119 Type 2 diabetes mellitus without complications: Secondary | ICD-10-CM | POA: Diagnosis not present

## 2019-03-02 NOTE — Progress Notes (Addendum)
Primary Care Physician: Jinny Sanders, MD Referring Physician: Dr. Tawni Pummel is a 73 y.o. female with a h/o afib on flecainide and s/p ablation in 2018. She is here for 3 month f/u. She had return of afib in early December in the setting of injury to her dog. She called the office and her flecainide was increased to 100 mg bid for a few days and she returned to [place]. She was seen and dx with covid 12/17. Her symptoms started  7 days earlier. The pt,  her husband and her daughter all had Covid. They all recovered at home with relatively mild symptoms. She  is feeling well today and no further issues with afib. She continues on eliquis 5 mg bid for a CHA2DS2VASc score of 4.  Today, she denies symptoms of palpitations, chest pain, shortness of breath, orthopnea, PND, lower extremity edema, dizziness, presyncope, syncope, or neurologic sequela. The patient is tolerating medications without difficulties and is otherwise without complaint today.   Past Medical History:  Diagnosis Date  . Anemia   . Anxiety   . Colon polyps   . Constipation   . Depressive disorder, not elsewhere classified   . Diaphragmatic hernia without mention of obstruction or gangrene   . Disorders of bursae and tendons in shoulder region, unspecified   . Diverticulosis of colon (without mention of hemorrhage)   . External hemorrhoid   . Fundic gland polyps of stomach, benign   . GERD (gastroesophageal reflux disease)   . History of hiatal hernia   . HTN (hypertension)   . Hypothyroidism   . Internal hemorrhoids without mention of complication   . Moderate mitral regurgitation   . Obesity, unspecified   . OSA on CPAP    compliant with CPAP  . Other urinary incontinence   . Persistent atrial fibrillation ([place])   . Pure hypercholesterolemia   . Type II diabetes mellitus ([place])   . Urinary frequency   . Urinary urgency   . Vitamin B 12 deficiency    Past Surgical History:  Procedure Laterality Date    . ABDOMINAL HYSTERECTOMY  1972   Have both ovaries  . ATRIAL FIBRILLATION ABLATION  12/17/2016  . ATRIAL FIBRILLATION ABLATION N/A 12/17/2016   Procedure: ATRIAL FIBRILLATION ABLATION;  Surgeon: Thompson Grayer, MD;  Location: [facility];  Service: Cardiovascular;  Laterality: N/A;  . [place]   [facility]; "Dr. [name]"  . CARDIOVERSION N/A 05/27/2016   Procedure: CARDIOVERSION;  Surgeon: [name], MD;  Location: [facility];  Service: Cardiovascular;  Laterality: N/A;  . CARDIOVERSION N/A 11/25/2016   Procedure: CARDIOVERSION;  Surgeon: [name], MD;  Location: [facility];  Service: Cardiovascular;  Laterality: N/A;  . CARDIOVERSION N/A 09/18/2018   Procedure: CARDIOVERSION;  Surgeon: [name], MD;  Location: [facility];  Service: Cardiovascular;  Laterality: N/A;  . CATARACT EXTRACTION W/ INTRAOCULAR LENS  IMPLANT, BILATERAL Bilateral   . COLONOSCOPY    . ELECTROPHYSIOLOGIC STUDY N/A 01/24/2016   Procedure: CARDIOVERSION;  Surgeon: [name], MD;  Location: [facility];  Service: Cardiovascular;  Laterality: N/A;  . LAPAROSCOPIC CHOLECYSTECTOMY    . REDUCTION MAMMAPLASTY Bilateral   . TONSILLECTOMY    . TOTAL HIP ARTHROPLASTY Right 05/12/2018   Procedure: TOTAL HIP ARTHROPLASTY ANTERIOR APPROACH;  Surgeon: Paralee Cancel, MD;  Location: [facility];  Service: Orthopedics;  Laterality: Right;  70 mins    Current Outpatient Medications  Medication Sig Dispense Refill  .  amoxicillin (AMOXIL) 500 MG capsule TAKE 4 CAPSULES BY MOUTH 1 HR PRIOR TO DENTAL APPT    . apixaban (ELIQUIS) 5 MG TABS tablet Take 1 tablet (5 mg total) by mouth 2 (two) times daily. 60 tablet 5  . atorvastatin (LIPITOR) 10 MG tablet TAKE 1 TABLET BY MOUTH EVERYDAY AT BEDTIME (Patient taking differently: Take 10 mg by mouth at bedtime. ) 90 tablet 3  . bisacodyl (DULCOLAX) 10 MG suppository Place 1 suppository (10 mg total) rectally as needed for moderate constipation. 12 suppository 0   . Calcium Carb-Ergocalciferol 500-200 MG-UNIT TABS Take 1 tablet by mouth daily.     [name] diltiazem (CARDIZEM CD) 120 MG 24 hr capsule TAKE 1 CAPSULE BY MOUTH EVERY DAY 90 capsule 2  . escitalopram (LEXAPRO) 10 MG tablet Take 1 tablet by mouth once daily 90 tablet 2  . flecainide (TAMBOCOR) 50 MG tablet Take 1 tablet (50 mg total) by mouth 2 (two) times daily. 180 tablet 3  . glucose blood test strip CHECK BLOOD SUGAR 2 TIMES A DAY AS DIRECTED DX CODE E11.9 100 each 7  . linagliptin (TRADJENTA) 5 MG TABS tablet Take 1 tablet (5 mg total) by mouth daily. 30 tablet 11  . metFORMIN (GLUCOPHAGE-XR) 500 MG 24 hr tablet TAKE 2 TABLETS (1,000 MG TOTAL) BY MOUTH 2 (TWO) TIMES DAILY WITH A MEAL. 360 tablet 1  . Multiple Vitamin (MULTIVITAMIN) tablet Take 1 tablet by mouth daily.      . TOVIAZ 4 MG TB24 tablet TAKE 1 TABLET BY MOUTH EVERY DAY 30 tablet 5  . traZODone (DESYREL) 50 MG tablet Take 1 tablet (50 mg total) by mouth at bedtime. 30 tablet 2   No current facility-administered medications for this encounter.    Allergies  Allergen Reactions  . Pravachol [Pravastatin Sodium]     Leg pain   . Contrast Media [Iodinated Diagnostic Agents] Rash    Social History   Socioeconomic History  . Marital status: Married    Spouse name: Laverna Peace  . Number of children: 2  . Years of education: Not on file  . Highest education level: Not on file  Occupational History  . Occupation: retired    Fish farm manager: UNEMPLOYED  Tobacco Use  . Smoking status: Never Smoker  . Smokeless tobacco: Never Used  Substance and Sexual Activity  . Alcohol use: No    Alcohol/week: 0.0 standard drinks  . Drug use: No  . Sexual activity: Yes  Other Topics Concern  . Not on file  Social History Narrative   Exercise walks 3 times daily   Lives with spouse in [place] - retired from employment   1 son and 1 daughter      Diet fruit, occ fast food, salads and lean meat      3 tea/day, no EtOH, no tobacco,  no drugs   Social Determinants of Health   Financial Resource Strain: Low Risk   . Difficulty of Paying Living Expenses: Not very hard  Food Insecurity: Food Insecurity Present  . Worried About Charity fundraiser in the Last Year: Sometimes true  . Ran Out of Food in the Last Year: Sometimes true  Transportation Needs: No Transportation Needs  . Lack of Transportation (Medical): No  . Lack of Transportation (Non-Medical): No  Physical Activity:   . Days of Exercise per Week: Not on file  . Minutes of Exercise per Session: Not on file  Stress: No Stress Concern Present  . Feeling of  Stress : Only a little  Social Connections: Unknown  . Frequency of Communication with Friends and Family: More than three times a week  . Frequency of Social Gatherings with Friends and Family: Not on file  . Attends Religious Services: Not on file  . Active Member of Clubs or Organizations: Not on file  . Attends Archivist Meetings: Not on file  . Marital Status: Not on file  Intimate Partner Violence: Not At Risk  . Fear of Current or Ex-Partner: No  . Emotionally Abused: No  . Physically Abused: No  . Sexually Abused: No    Family History  Problem Relation Age of Onset  . Cancer Mother        fallopian tube  . Dementia Mother   . Pulmonary embolism Mother   . Hyperlipidemia Mother   . Diabetes Mother   . Hyperlipidemia Father   . Lung cancer Father   . Hypertension Sister   . Diabetes Brother   . Stroke Paternal Grandmother   . Stroke Paternal Grandfather   . Colon cancer Neg Hx   . Esophageal cancer Neg Hx     ROS- All systems are reviewed and negative except as per the HPI above  Physical Exam: Vitals:   03/02/19 1039  BP: 106/64  Pulse: 85  Weight: 73.8 kg  Height: 5\' 5"  (1.651 m)   Wt Readings from Last 3 Encounters:  03/02/19 73.8 kg  02/18/19 71.2 kg  01/01/19 74.4 kg    Labs: Lab Results  Component Value Date   NA 130 (L) 01/01/2019   K 4.6  01/01/2019   CL 95 (L) 01/01/2019   CO2 25 01/01/2019   GLUCOSE 137 (H) 01/01/2019   BUN 17 01/01/2019   CREATININE 0.96 01/01/2019   CALCIUM 9.9 01/01/2019   MG 1.9 09/15/2018   No results found for: INR Lab Results  Component Value Date   CHOL 153 08/17/2018   HDL 43.80 08/17/2018   LDLCALC 91 08/17/2018   TRIG 92.0 08/17/2018     GEN- The patient is well appearing, alert and oriented x 3 today.   Head- normocephalic, atraumatic Eyes-  Sclera clear, conjunctiva pink Ears- hearing intact Oropharynx- clear Neck- supple, no JVP Lymph- no cervical lymphadenopathy Lungs- Clear to ausculation bilaterally, normal work of breathing Heart- Regular rate and rhythm, no murmurs, rubs or gallops, PMI not laterally displaced GI- soft, NT, ND, + BS Extremities- no clubbing, cyanosis, or edema MS- no significant deformity or atrophy Skin- no rash or lesion Psych- euthymic mood, full affect Neuro- strength and sensation are intact  EKG- NSR at 85 bpm, pr int 186 ms, qrs int 86 ms, qtc 426 ms Epic records reviewed   Assessment and Plan: 1. Paroxysmal afib  Continue on flecainide 50 mg bid and diltiazem 120 mg daily Continue  cpap for tx of OSA  2. HTN Stable  3. Covid Symptoms resolved  4. Moderate MR Under surveillance by Dr. Rayann Heman on timing to send to Dr. Roxy Manns for repair No new symptoms to indicate worsening valve status  5. CHA2DS2VASc of 4 Continue  eliquis 5 mg bid    F/u with Dr. Rayann Heman 04/18/2018   Geroge Baseman. Torrion Witter, [facility] [address] [place],  [address] [phone]

## 2019-03-02 NOTE — Addendum Note (Signed)
Encounter addended by: Sherran Needs, NP on: 03/02/2019 1:16 PM  Actions taken: Clinical Note Signed

## 2019-03-25 ENCOUNTER — Encounter: Payer: Self-pay | Admitting: Internal Medicine

## 2019-03-25 ENCOUNTER — Other Ambulatory Visit: Payer: Self-pay | Admitting: Internal Medicine

## 2019-03-25 NOTE — Telephone Encounter (Signed)
Eliquis 5mg  refill request received, pt is 73yrs old, weight-73.8kg, Crea-0.96 on 01/01/2019, Diagnosis-Afib, and last seen by Orson Eva, NP on 03/02/2019. Dose is appropriate based on dosing criteria. Will send in refill to requested pharmacy.

## 2019-03-25 NOTE — Progress Notes (Signed)
Received labs from [facility] from 01/25/2019: Hemoglobin A1c 6.3%, BUN/creatinine 11/0.88, GFR 66 Cholesterol: 165/60/55/98 C-reactive protein 0.61 TSH 4.26 (0.45-4.5), total T4 5.6 (4.5-12), Free T4 0.93 (0.82-1.77), free T3 2.6 (2-4.4), TPO antibodies 191 (0-34), thyroglobulin antibodies 21.6 (0-0.9)  New diagnosis of euthyroid Hashimoto's thyroiditis.  No intervention needed for now, but I will continue to follow her thyroid test from now on.

## 2019-04-01 DIAGNOSIS — R69 Illness, unspecified: Secondary | ICD-10-CM | POA: Diagnosis not present

## 2019-04-04 ENCOUNTER — Other Ambulatory Visit: Payer: Self-pay | Admitting: Internal Medicine

## 2019-04-05 ENCOUNTER — Telehealth (INDEPENDENT_AMBULATORY_CARE_PROVIDER_SITE_OTHER): Payer: Medicare HMO | Admitting: Internal Medicine

## 2019-04-05 ENCOUNTER — Encounter: Payer: Self-pay | Admitting: Internal Medicine

## 2019-04-05 VITALS — BP 123/60 | HR 89 | Ht 65.0 in | Wt 160.0 lb

## 2019-04-05 DIAGNOSIS — I340 Nonrheumatic mitral (valve) insufficiency: Secondary | ICD-10-CM | POA: Diagnosis not present

## 2019-04-05 DIAGNOSIS — I4819 Other persistent atrial fibrillation: Secondary | ICD-10-CM | POA: Diagnosis not present

## 2019-04-05 DIAGNOSIS — G4733 Obstructive sleep apnea (adult) (pediatric): Secondary | ICD-10-CM

## 2019-04-05 DIAGNOSIS — D6869 Other thrombophilia: Secondary | ICD-10-CM | POA: Diagnosis not present

## 2019-04-05 NOTE — Addendum Note (Signed)
Addended by: Thora Lance on: 04/05/2019 03:15 PM   Modules accepted: Orders

## 2019-04-05 NOTE — Telephone Encounter (Signed)
DK, please advise on refill.  DS pt, due for appt 04/2019. Last refilled by Dr. Mortimer Fries on 01/2019.

## 2019-04-05 NOTE — Progress Notes (Signed)
Electrophysiology TeleHealth Note   Due to national recommendations of social distancing due to COVID 19, an audio/video telehealth visit is felt to be most appropriate for this patient at this time.  See MyChart message from today for the patient's consent to telehealth for [facility].   Date:  04/05/2019   ID:  [name], DOB [birth year], MRN [id number]  Location: patient's home  Provider location:  [facility]  Evaluation Performed: Follow-up visit  PCP:  Jinny Sanders, MD   Electrophysiologist:  Dr Rayann Heman  Chief Complaint:  palpitations  History of Present Illness:    [name] is a 74 y.o. female who presents via telehealth conferencing today.  Since last being seen in our clinic, the patient reports doing very well.  Today, she denies symptoms of palpitations, chest pain, shortness of breath,  lower extremity edema, dizziness, presyncope, or syncope.  The patient is otherwise without complaint today.  The patient denies symptoms of fevers, chills, cough, or new SOB worrisome for COVID 19.  Past Medical History:  Diagnosis Date  . Anemia   . Anxiety   . Colon polyps   . Constipation   . Depressive disorder, not elsewhere classified   . Diaphragmatic hernia without mention of obstruction or gangrene   . Disorders of bursae and tendons in shoulder region, unspecified   . Diverticulosis of colon (without mention of hemorrhage)   . External hemorrhoid   . Fundic gland polyps of stomach, benign   . GERD (gastroesophageal reflux disease)   . History of hiatal hernia   . HTN (hypertension)   . Hypothyroidism   . Internal hemorrhoids without mention of complication   . Moderate mitral regurgitation   . Obesity, unspecified   . OSA on CPAP    compliant with CPAP  . Other urinary incontinence   . Persistent atrial fibrillation ([place])   . Pure hypercholesterolemia   . Type II diabetes mellitus ([place])   . Urinary frequency   . Urinary urgency   . Vitamin B  12 deficiency     Past Surgical History:  Procedure Laterality Date  . ABDOMINAL HYSTERECTOMY  1972   Have both ovaries  . ATRIAL FIBRILLATION ABLATION  12/17/2016  . ATRIAL FIBRILLATION ABLATION N/A 12/17/2016   Procedure: ATRIAL FIBRILLATION ABLATION;  Surgeon: Thompson Grayer, MD;  Location: [facility];  Service: Cardiovascular;  Laterality: N/A;  . [facility]; "Dr. [name]"  . CARDIOVERSION N/A 05/27/2016   Procedure: CARDIOVERSION;  Surgeon: [name], MD;  Location: [facility];  Service: Cardiovascular;  Laterality: N/A;  . CARDIOVERSION N/A 11/25/2016   Procedure: CARDIOVERSION;  Surgeon: [name], MD;  Location: [facility];  Service: Cardiovascular;  Laterality: N/A;  . CARDIOVERSION N/A 09/18/2018   Procedure: CARDIOVERSION;  Surgeon: [name], MD;  Location: [facility];  Service: Cardiovascular;  Laterality: N/A;  . CATARACT EXTRACTION W/ INTRAOCULAR LENS  IMPLANT, BILATERAL Bilateral   . COLONOSCOPY    . ELECTROPHYSIOLOGIC STUDY N/A 01/24/2016   Procedure: CARDIOVERSION;  Surgeon: [name], MD;  Location: [facility];  Service: Cardiovascular;  Laterality: N/A;  . LAPAROSCOPIC CHOLECYSTECTOMY    . REDUCTION MAMMAPLASTY Bilateral   . TONSILLECTOMY    . TOTAL HIP ARTHROPLASTY Right 05/12/2018   Procedure: TOTAL HIP ARTHROPLASTY ANTERIOR APPROACH;  Surgeon: Paralee Cancel, MD;  Location: [facility];  Service: Orthopedics;  Laterality: Right;  70 mins    Current Outpatient Medications  Medication Sig Dispense Refill  . amoxicillin (AMOXIL) 500 MG capsule TAKE 4 CAPSULES BY MOUTH 1 HR PRIOR TO DENTAL APPT    . atorvastatin (LIPITOR) 10 MG tablet TAKE 1 TABLET BY MOUTH EVERYDAY AT BEDTIME 90 tablet 3  . bisacodyl (DULCOLAX) 10 MG suppository Place 1 suppository (10 mg total) rectally as needed for moderate constipation. 12 suppository 0  . Calcium Carb-Ergocalciferol 500-200 MG-UNIT TABS Take 1 tablet by mouth daily.     [name] diltiazem  (CARDIZEM CD) 120 MG 24 hr capsule TAKE 1 CAPSULE BY MOUTH EVERY DAY 90 capsule 2  . ELIQUIS 5 MG TABS tablet TAKE 1 TABLET BY MOUTH TWICE A DAY 60 tablet 9  . escitalopram (LEXAPRO) 10 MG tablet Take 1 tablet by mouth once daily 90 tablet 2  . flecainide (TAMBOCOR) 50 MG tablet Take 1 tablet (50 mg total) by mouth 2 (two) times daily. 180 tablet 3  . glucose blood test strip CHECK BLOOD SUGAR 2 TIMES A DAY AS DIRECTED DX CODE E11.9 100 each 7  . linagliptin (TRADJENTA) 5 MG TABS tablet Take 1 tablet (5 mg total) by mouth daily. 30 tablet 11  . metFORMIN (GLUCOPHAGE-XR) 500 MG 24 hr tablet TAKE 2 TABLETS (1,000 MG TOTAL) BY MOUTH 2 (TWO) TIMES DAILY WITH A MEAL. 360 tablet 1  . Multiple Vitamin (MULTIVITAMIN) tablet Take 1 tablet by mouth daily.      . TOVIAZ 4 MG TB24 tablet TAKE 1 TABLET BY MOUTH EVERY DAY 30 tablet 5  . traZODone (DESYREL) 50 MG tablet Take 1 tablet (50 mg total) by mouth at bedtime. 30 tablet 2   No current facility-administered medications for this visit.    Allergies:   Pravachol [pravastatin sodium] and Contrast media [iodinated diagnostic agents]   Social History:  The patient  reports that she has never smoked. She has never used smokeless tobacco. She reports that she does not drink alcohol or use drugs.   Family History:  The patient's family history includes Cancer in her mother; Dementia in her mother; Diabetes in her brother and mother; Hyperlipidemia in her father and mother; Hypertension in her sister; Lung cancer in her father; Pulmonary embolism in her mother; Stroke in her paternal grandfather and paternal grandmother.   ROS:  Please see the history of present illness.   All other systems are personally reviewed and negative.    Exam:    Vital Signs:  BP 123/60   Pulse 89   Ht 5\' 5"  (1.651 m)   Wt 160 lb (72.6 kg)   LMP  (LMP Unknown)   BMI 26.63 kg/m   Well sounding and appearing, alert and conversant, regular work of breathing,  good skin  color Eyes- anicteric, neuro- grossly intact, skin- no apparent rash or lesions or cyanosis, mouth- oral mucosa is pink  Labs/Other Tests and Data Reviewed:    Recent Labs: 09/15/2018: Magnesium 1.9 01/01/2019: ALT 14; BUN 17; Creatinine, Ser 0.96; Hemoglobin 12.3; Platelets 288; Potassium 4.6; Sodium 130   Wt Readings from Last 3 Encounters:  04/05/19 160 lb (72.6 kg)  03/02/19 162 lb 12.8 oz (73.8 kg)  02/18/19 157 lb (71.2 kg)     ASSESSMENT & PLAN:    1.  Persistent atrial fibrillation/ atach chads2vasc score is at least 4.  She is on eliquis She has severe LA enlargement but is maintaining sinus post ablation with help of low dose flecainide.  2. HTN Stable No change required today  3. OSA Not using her  CPAP Compliance is encouraged  4. Moderate MR Given severe LA enlargement and AF, we will need to follow her MR closely.  I suspect that she will eventually require surgery. With MV repair and MAZE. Repeat echo at this time.   Follow-up:  3 months in afib clinic   Patient Risk:  after full review of this patients clinical status, I feel that they are at moderate risk at this time.  Today, I have spent 15 minutes with the patient with telehealth technology discussing arrhythmia management .    SignedThompson Grayer, MD  04/05/2019 2:23 PM     [address] [place] [id number] [phone] (office) [phone] (fax)

## 2019-04-08 ENCOUNTER — Other Ambulatory Visit: Payer: Self-pay | Admitting: Family Medicine

## 2019-04-08 MED ORDER — SOLIFENACIN SUCCINATE 10 MG PO TABS: 10.0000 mg | ORAL_TABLET | Freq: Every day | ORAL | 11 refills | Status: DC

## 2019-04-15 ENCOUNTER — Other Ambulatory Visit: Payer: Self-pay

## 2019-04-15 ENCOUNTER — Ambulatory Visit (HOSPITAL_COMMUNITY): Payer: Medicare HMO | Attending: Internal Medicine

## 2019-04-15 DIAGNOSIS — I4819 Other persistent atrial fibrillation: Secondary | ICD-10-CM

## 2019-04-15 DIAGNOSIS — I340 Nonrheumatic mitral (valve) insufficiency: Secondary | ICD-10-CM

## 2019-04-19 ENCOUNTER — Ambulatory Visit: Payer: Medicare HMO | Admitting: Internal Medicine

## 2019-05-11 DIAGNOSIS — L989 Disorder of the skin and subcutaneous tissue, unspecified: Secondary | ICD-10-CM | POA: Diagnosis not present

## 2019-05-11 DIAGNOSIS — D485 Neoplasm of uncertain behavior of skin: Secondary | ICD-10-CM | POA: Diagnosis not present

## 2019-05-13 ENCOUNTER — Other Ambulatory Visit: Payer: Self-pay

## 2019-05-15 DIAGNOSIS — R69 Illness, unspecified: Secondary | ICD-10-CM | POA: Diagnosis not present

## 2019-05-17 ENCOUNTER — Ambulatory Visit: Payer: Medicare HMO | Admitting: Internal Medicine

## 2019-05-17 ENCOUNTER — Other Ambulatory Visit: Payer: Self-pay | Admitting: Internal Medicine

## 2019-05-17 ENCOUNTER — Encounter: Payer: Self-pay | Admitting: Internal Medicine

## 2019-05-17 ENCOUNTER — Other Ambulatory Visit: Payer: Self-pay

## 2019-05-17 VITALS — BP 140/70 | HR 70 | Ht 64.0 in | Wt 167.0 lb

## 2019-05-17 DIAGNOSIS — E063 Autoimmune thyroiditis: Secondary | ICD-10-CM | POA: Diagnosis not present

## 2019-05-17 DIAGNOSIS — E7800 Pure hypercholesterolemia, unspecified: Secondary | ICD-10-CM | POA: Diagnosis not present

## 2019-05-17 DIAGNOSIS — E119 Type 2 diabetes mellitus without complications: Secondary | ICD-10-CM

## 2019-05-17 LAB — POCT GLYCOSYLATED HEMOGLOBIN (HGB A1C): Hemoglobin A1C: 6.2 % — AB (ref 4.0–5.6)

## 2019-05-17 NOTE — Patient Instructions (Signed)
Please continue: - Metformin ER 1000 mg 2x a day with meals - Tradjenta 5 mg before lunch  Please come back for a follow-up appointment in 6 months.

## 2019-05-17 NOTE — Progress Notes (Signed)
Patient ID: [name], female   DOB: April 28, 1945, 74 y.o.   MRN: [id number]  This visit occurred during the SARS-CoV-2 public health emergency.  Safety protocols were in place, including screening questions prior to the visit, additional usage of staff PPE, and extensive cleaning of exam room while observing appropriate contact time as indicated for disinfecting solutions.   HPI: [name] is a 74 y.o.-year-old female, presenting for f/u for DM2, dx 1995, insulin-dependent since 2014 - now off insulin since 12/2016, with improved control, without long term complications. Last visit 6 mo ago.  She developed COVID-19 in 02/2019.  She had infusion of Bamlanivimab.  She recovered well. Her sugars are higher 2/2 dietary indiscretions.  Reviewed HbA1c levels: 01/25/2019: HbA1c 6.3% Lab Results  Component Value Date   HGBA1C 6.1 (A) 11/17/2018   HGBA1C 6.3 (H) 05/07/2018   HGBA1C 6.2 (A) 04/16/2018   HGBA1C 6.4 (A) 12/01/2017   HGBA1C 6.7 (A) 07/25/2017   HGBA1C 6.7 03/27/2017   HGBA1C 6.4 (H) 11/20/2016   HGBA1C 6.9 09/16/2016   HGBA1C 6.9 05/23/2016   HGBA1C 6.8 02/23/2016   Pt is on a regimen of: - Metformin ER 1000 mg twice a day with meals.  She tried to take the entire dose with dinner but this was not as effective. - Tradjenta 5 mg before lunch She was on Basaglar 12 >> 14 units in hs >> stopped at last visit - but still takes it seldom (10-12 units)  Pt checks her sugars 2x a day: - am: 98, 106-120s, 130 >> 100-127 >> 90-120 >> 90-150 - 2h after b'fast: 90-103 >> 113-141 >> n/c - before lunch:90-130 >> 100-125, 199 >> n/c - 2h after lunch:  160-180 >> 140-160s >> n/c  >> 140-180 >> n/c - before dinner: n/c >> 102-144 >> n/c >> 80  - 2h after dinner: 160-180 >> 140-180 >> n/c >> 160-180, 200 - bedtime: 120-160 >> 140-160 >> n/c - nighttime: n/c Lowest sugar was 97 >> 90 >> 80 (after working outside; she has hypoglycemia awareness in the 80s. Highest sugar was 200  (starch) >> 180 >> 180 >> 200.  Pt's meals are: - Breakfast: cereals + 2% milk; bread + cheese; bread + PB - Lunch: sandwich; soup - Dinner: chicken; steak; seafood - Snacks: 2: PB crackers  -No CKD, last BUN/creatinine:  01/25/2019: BUN/creatinine 11/0.88, GFR 66 Lab Results  Component Value Date   BUN 17 01/01/2019   CREATININE 0.96 01/01/2019  Not on an ACE inhibitor or ARB. -+ HL; last set of lipids: 01/25/2019: 165/60/55/98 Lab Results  Component Value Date   CHOL 153 08/17/2018   HDL 43.80 08/17/2018   LDLCALC 91 08/17/2018   LDLDIRECT 163.1 07/04/2011   TRIG 92.0 08/17/2018   CHOLHDL 3 08/17/2018  On Lipitor. - last eye exam was in 05/2018: No DR. [name]  She had cataract surgery in 04 and 07/2016.  In 03/2018 she had surgery at [facility]: film cleaned from her eye. -She denies numbness and tingling in her feet.  She has A. Fib >> she had several cardioversions. On Flecainide (dose lowered), Diltiazem, Eliquis. She was previously on Vesicare but had to stop due to cost.  She started [place], but this is not working as well. 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 She has a history of sciatica for which she had prednisone in the past.   Since last visit, she was also diagnosed with euthyroid Hashimoto's thyroiditis: 01/25/2019: TSH 4.26 (  0.45-4.5), total T4 5.6 (4.5-12), Free T4 0.93 (0.82-1.77), free T3 2.6 (2-4.4), TPO antibodies 191 (0-34), thyroglobulin antibodies 21.6 (0-0.9)  Pt denies: - feeling nodules in neck - hoarseness - dysphagia - choking - SOB with lying down  No FH of thyroid ds. Or thyroid cancer. No h/o radiation tx to head or neck.  No seaweed or kelp. No recent contrast studies. No herbal supplements. No Biotin use. No recent steroids use.   ROS: Constitutional: no weight gain/no weight loss, no fatigue, no subjective hyperthermia, no subjective hypothermia Eyes: no blurry vision, no xerophthalmia ENT: no sore throat, +  see HPI Cardiovascular: no CP/no SOB/no palpitations/no leg swelling Respiratory: no cough/no SOB/no wheezing Gastrointestinal: no N/no V/no D/no C/no acid reflux Musculoskeletal: no muscle aches/no joint aches Skin: no rashes, no hair loss Neurological: no tremors/no numbness/no tingling/no dizziness  I reviewed pt's medications, allergies, PMH, social hx, family hx, and changes were documented in the history of present illness. Otherwise, unchanged from my initial visit note.  Past Medical History:  Diagnosis Date  . Anemia   . Anxiety   . Colon polyps   . Constipation   . Depressive disorder, not elsewhere classified   . Diaphragmatic hernia without mention of obstruction or gangrene   . Disorders of bursae and tendons in shoulder region, unspecified   . Diverticulosis of colon (without mention of hemorrhage)   . External hemorrhoid   . Fundic gland polyps of stomach, benign   . GERD (gastroesophageal reflux disease)   . History of hiatal hernia   . HTN (hypertension)   . Hypothyroidism   . Internal hemorrhoids without mention of complication   . Moderate mitral regurgitation   . Obesity, unspecified   . OSA on CPAP    compliant with CPAP  . Other urinary incontinence   . Persistent atrial fibrillation ([place])   . Pure hypercholesterolemia   . Type II diabetes mellitus ([place])   . Urinary frequency   . Urinary urgency   . Vitamin B 12 deficiency    Past Surgical History:  Procedure Laterality Date  . ABDOMINAL HYSTERECTOMY  1972   Have both ovaries  . ATRIAL FIBRILLATION ABLATION  12/17/2016  . ATRIAL FIBRILLATION ABLATION N/A 12/17/2016   Procedure: ATRIAL FIBRILLATION ABLATION;  Surgeon: Thompson Grayer, MD;  Location: [place] [facility];  Service: Cardiovascular;  Laterality: N/A;  . [place]   [facility]; "Dr. [name]"  . CARDIOVERSION N/A 05/27/2016   Procedure: CARDIOVERSION;  Surgeon: [name], MD;  Location: [facility];  Service: Cardiovascular;   Laterality: N/A;  . CARDIOVERSION N/A 11/25/2016   Procedure: CARDIOVERSION;  Surgeon: [name], MD;  Location: [facility];  Service: Cardiovascular;  Laterality: N/A;  . CARDIOVERSION N/A 09/18/2018   Procedure: CARDIOVERSION;  Surgeon: [name], MD;  Location: [facility];  Service: Cardiovascular;  Laterality: N/A;  . CATARACT EXTRACTION W/ INTRAOCULAR LENS  IMPLANT, BILATERAL Bilateral   . COLONOSCOPY    . ELECTROPHYSIOLOGIC STUDY N/A 01/24/2016   Procedure: CARDIOVERSION;  Surgeon: [name], MD;  Location: [facility];  Service: Cardiovascular;  Laterality: N/A;  . LAPAROSCOPIC CHOLECYSTECTOMY    . REDUCTION MAMMAPLASTY Bilateral   . TONSILLECTOMY    . TOTAL HIP ARTHROPLASTY Right 05/12/2018   Procedure: TOTAL HIP ARTHROPLASTY ANTERIOR APPROACH;  Surgeon: Paralee Cancel, MD;  Location: [facility];  Service: Orthopedics;  Laterality: Right;  70 mins   Social History   Socioeconomic History  . Marital status: Married  Spouse name: Laverna Peace  . Number of children: 2  . Years of education: Not on file  . Highest education level: Not on file  Occupational History  . Occupation: retired    Fish farm manager: UNEMPLOYED  Tobacco Use  . Smoking status: Never Smoker  . Smokeless tobacco: Never Used  Substance and Sexual Activity  . Alcohol use: No    Alcohol/week: 0.0 standard drinks  . Drug use: No  . Sexual activity: Yes  Other Topics Concern  . Not on file  Social History Narrative   Exercise walks 3 times daily   Lives with spouse in [place] - retired from employment   1 son and 1 daughter      Diet fruit, occ fast food, salads and lean meat      3 tea/day, no EtOH, no tobacco, no drugs   Social Determinants of Health   Financial Resource Strain: Low Risk   . Difficulty of Paying Living Expenses: Not very hard  Food Insecurity: Food Insecurity Present  . Worried About Charity fundraiser in the Last Year: Sometimes true  . Ran Out of Food in the Last  Year: Sometimes true  Transportation Needs: No Transportation Needs  . Lack of Transportation (Medical): No  . Lack of Transportation (Non-Medical): No  Physical Activity:   . Days of Exercise per Week:   . Minutes of Exercise per Session:   Stress: No Stress Concern Present  . Feeling of Stress : Only a little  Social Connections: Unknown  . Frequency of Communication with Friends and Family: More than three times a week  . Frequency of Social Gatherings with Friends and Family: Not on file  . Attends Religious Services: Not on file  . Active Member of Clubs or Organizations: Not on file  . Attends Archivist Meetings: Not on file  . Marital Status: Not on file  Intimate Partner Violence: Not At Risk  . Fear of Current or Ex-Partner: No  . Emotionally Abused: No  . Physically Abused: No  . Sexually Abused: No   Current Outpatient Medications on File Prior to Visit  Medication Sig Dispense Refill  . amoxicillin (AMOXIL) 500 MG capsule TAKE 4 CAPSULES BY MOUTH 1 HR PRIOR TO DENTAL APPT    . atorvastatin (LIPITOR) 10 MG tablet TAKE 1 TABLET BY MOUTH EVERYDAY AT BEDTIME 90 tablet 3  . bisacodyl (DULCOLAX) 10 MG suppository Place 1 suppository (10 mg total) rectally as needed for moderate constipation. 12 suppository 0  . Calcium Carb-Ergocalciferol 500-200 MG-UNIT TABS Take 1 tablet by mouth daily.     [name] diltiazem (CARDIZEM CD) 120 MG 24 hr capsule TAKE 1 CAPSULE BY MOUTH EVERY DAY 90 capsule 2  . ELIQUIS 5 MG TABS tablet TAKE 1 TABLET BY MOUTH TWICE A DAY 60 tablet 9  . escitalopram (LEXAPRO) 10 MG tablet Take 1 tablet by mouth once daily 90 tablet 2  . flecainide (TAMBOCOR) 50 MG tablet Take 1 tablet (50 mg total) by mouth 2 (two) times daily. 180 tablet 3  . glucose blood test strip CHECK BLOOD SUGAR 2 TIMES A DAY AS DIRECTED DX CODE E11.9 100 each 7  . linagliptin (TRADJENTA) 5 MG TABS tablet Take 1 tablet (5 mg total) by mouth daily. 30 tablet 11  . metFORMIN  (GLUCOPHAGE-XR) 500 MG 24 hr tablet TAKE 2 TABLETS (1,000 MG TOTAL) BY MOUTH 2 (TWO) TIMES DAILY WITH A MEAL. 360 tablet 1  . Multiple Vitamin (MULTIVITAMIN) tablet  Take 1 tablet by mouth daily.      . solifenacin (VESICARE) 10 MG tablet Take 1 tablet (10 mg total) by mouth daily. 30 tablet 11  . traZODone (DESYREL) 50 MG tablet TAKE 1 TABLET BY MOUTH EVERYDAY AT BEDTIME 30 tablet 2   No current facility-administered medications on file prior to visit.   Allergies  Allergen Reactions  . Pravachol [Pravastatin Sodium]     Leg pain   . Contrast Media [Iodinated Diagnostic Agents] Rash   Family History  Problem Relation Age of Onset  . Cancer Mother        fallopian tube  . Dementia Mother   . Pulmonary embolism Mother   . Hyperlipidemia Mother   . Diabetes Mother   . Hyperlipidemia Father   . Lung cancer Father   . Hypertension Sister   . Diabetes Brother   . Stroke Paternal Grandmother   . Stroke Paternal Grandfather   . Colon cancer Neg Hx   . Esophageal cancer Neg Hx     PE: BP 140/70   Pulse 70   Ht 5\' 4"  (1.626 m)   Wt 167 lb (75.8 kg)   LMP  (LMP Unknown)   SpO2 98%   BMI 28.67 kg/m  Body mass index is 28.67 kg/m. Wt Readings from Last 3 Encounters:  05/17/19 167 lb (75.8 kg)  04/05/19 160 lb (72.6 kg)  03/02/19 162 lb 12.8 oz (73.8 kg)  06/30/2018: Weight: 157 lbs  Constitutional: overweight, in NAD Eyes: PERRLA, EOMI, no exophthalmos ENT: moist mucous membranes, no thyromegaly, no cervical lymphadenopathy Cardiovascular: RRR, No MRG Respiratory: CTA B Gastrointestinal: abdomen soft, NT, ND, BS+ Musculoskeletal: no deformities, strength intact in all 4 Skin: moist, warm, no rashes Neurological: no tremor with outstretched hands, DTR normal in all 4  ASSESSMENT: 1. DM2, previously insulin-dependent, controlled, without long term complications, but with hyperglycemia - she was contemplating gastric sleeve sx >> on hold now  2. Hyperlipidemia  3.   Euthyroid Hashimoto's thyroiditis  PLAN:  1. Patient with longstanding, now controlled, type 2 diabetes, initially insulin-dependent, now off insulin after she started to improve her diet and lose weight.  She continues on Metformin and DPP 4 inhibitor, which she tolerates well at last visit, sugars were slightly better in the morning compared to previous visit and they were at goal later in the day.  She was not exercising consistently but was planning to start this.  We did not change her regimen. - at this visit, her sugars are higher, especially in the last few weeks.  She mentions that her mother-in-law died and she had a lot of food at her house recently.  This is now gone and she is getting back to her previous diet.  Also, she again plans to restart exercise.  As of now, she is just working in the garden -No change in regimen is needed for now, but I did advise him to let me know if her sugars do not improve - I suggested to:  Patient Instructions  Please continue: - Metformin ER 1000 mg 2x a day with meals - Tradjenta 5 mg before lunch  Please come back for a follow-up appointment in 6 months.  - we checked her HbA1c: 6.2% (actually lower) - advised to check sugars at different times of the day - 1x a day, rotating check times - advised for yearly eye exams >> she is UTD and has an appointment coming up in 06/2019 - return to clinic in  6 months   2.  Hyperlipidemia -Reviewed latest lipid panel from 01/2019: All fractions at goal, with LDL lower than 100 -Continues Lipitor without side effects  3.  Hashimoto's thyroiditis -Review latest TFTs from 01/2019: Normal, but antithyroid antibodies were elevated -No hypothyroid symptoms -Denies neck compression symptoms -We will repeat her TFTs at next visit  [name], MD PhD [facility] Endocrinology

## 2019-05-17 NOTE — Addendum Note (Signed)
Addended by: Cardell Peach I on: 05/17/2019 09:22 AM   Modules accepted: Orders

## 2019-05-18 ENCOUNTER — Other Ambulatory Visit: Payer: Self-pay

## 2019-05-18 ENCOUNTER — Ambulatory Visit
Admission: EM | Admit: 2019-05-18 | Discharge: 2019-05-18 | Disposition: A | Discharge status: Home / Self Care | Payer: Medicare HMO | Attending: Emergency Medicine | Admitting: Emergency Medicine

## 2019-05-18 DIAGNOSIS — N390 Urinary tract infection, site not specified: Secondary | ICD-10-CM | POA: Insufficient documentation

## 2019-05-18 LAB — POCT URINALYSIS DIP (MANUAL ENTRY)
Bilirubin, UA: NEGATIVE
Glucose, UA: 100 mg/dL — AB
Ketones, POC UA: NEGATIVE mg/dL
Nitrite, UA: NEGATIVE
Protein Ur, POC: NEGATIVE mg/dL
Spec Grav, UA: 1.015 (ref 1.010–1.025)
Urobilinogen, UA: 0.2 E.U./dL
pH, UA: 5.5 (ref 5.0–8.0)

## 2019-05-18 MED ORDER — CEPHALEXIN 500 MG PO CAPS: 500.0000 mg | ORAL_CAPSULE | Freq: Three times a day (TID) | ORAL | 0 refills | Status: AC

## 2019-05-18 NOTE — Discharge Instructions (Addendum)
Take the antibiotic as directed.  Urine culture is pending and we will call you if the results indicate the need to change her antibiotic.    Follow-up with your primary care provider if your symptoms are not improving.

## 2019-05-18 NOTE — ED Provider Notes (Signed)
[name]    CSN: [id number] Arrival date & time: 05/18/19  1026      History   Chief Complaint Chief Complaint  Patient presents with  . Urinary Frequency    HPI [name] is a 74 y.o. female.   Patient presents with 4-day history of urinary frequency and incontinence.  She denies fever, chills, dysuria, hematuria, abdominal pain, vaginal discharge, pelvic pain, or other symptoms.  Treatment attempted at home with Azo without relief.  She is diabetic and her A1c was 6.2 yesterday when she was seen by her PCP.  She also has a history of stress incontinence per medical chart.    The history is provided by the patient.    Past Medical History:  Diagnosis Date  . Anemia   . Anxiety   . Colon polyps   . Constipation   . Depressive disorder, not elsewhere classified   . Diaphragmatic hernia without mention of obstruction or gangrene   . Disorders of bursae and tendons in shoulder region, unspecified   . Diverticulosis of colon (without mention of hemorrhage)   . External hemorrhoid   . Fundic gland polyps of stomach, benign   . GERD (gastroesophageal reflux disease)   . History of hiatal hernia   . HTN (hypertension)   . Hypothyroidism   . Internal hemorrhoids without mention of complication   . Moderate mitral regurgitation   . Obesity, unspecified   . OSA on CPAP    compliant with CPAP  . Other urinary incontinence   . Persistent atrial fibrillation ([place])   . Pure hypercholesterolemia   . Type II diabetes mellitus ([place])   . Urinary frequency   . Urinary urgency   . Vitamin B 12 deficiency     Patient Active Problem List   Diagnosis Date Noted  . Atrial tachycardia ([place]) 09/15/2018  . 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 Vitamin B 12 deficiency   . Urge incontinence   . OSA on CPAP   . History of hiatal hernia   . GERD (gastroesophageal reflux disease)   . Anxiety   . Other constipation  01/02/2017  . Class 1 obesity with serious comorbidity and body mass index (BMI) of 30.0 to 30.9 in adult 01/02/2017  . Overweight (BMI 25.0-29.9) 12/19/2016  . Persistent atrial fibrillation ([place]) 12/17/2016  . Vitamin D deficiency 11/20/2016  . Family history of ovarian cancer 05/24/2016  . Iron deficiency anemia   . Essential hypertension   . Preoperative evaluation to rule out surgical contraindication 05/16/2015  . Controlled diabetes mellitus type II without complication ([place]) [id number]  . Mitral regurgitation 10/11/2013  . Incidental lung nodule 07/16/2011  . 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 SLEEP APNEA 10/25/2008  . Hypercholesterolemia 06/11/2006  . Depression, major, in remission ([place]) 06/11/2006  . HEMORRHOIDS, INTERNAL 06/11/2006  . DIVERTICULOSIS, COLON 06/11/2006  . STRESS INCONTINENCE 06/11/2006    Past Surgical History:  Procedure Laterality Date  . ABDOMINAL HYSTERECTOMY  1972   Have both ovaries  . ATRIAL FIBRILLATION ABLATION  12/17/2016  . ATRIAL FIBRILLATION ABLATION N/A 12/17/2016   Procedure: ATRIAL FIBRILLATION ABLATION;  Surgeon: Thompson Grayer, MD;  Location: [facility];  Service: Cardiovascular;  Laterality: N/A;  . [place]   [facility]; "Dr. [name]"  . CARDIOVERSION N/A 05/27/2016   Procedure: CARDIOVERSION;  Surgeon: [name], MD;  Location: [facility];  Service: Cardiovascular;  Laterality: N/A;  . CARDIOVERSION  N/A 11/25/2016   Procedure: CARDIOVERSION;  Surgeon: [name], MD;  Location: [facility];  Service: Cardiovascular;  Laterality: N/A;  . CARDIOVERSION N/A 09/18/2018   Procedure: CARDIOVERSION;  Surgeon: [name], MD;  Location: [facility];  Service: Cardiovascular;  Laterality: N/A;  . CATARACT EXTRACTION W/ INTRAOCULAR LENS  IMPLANT, BILATERAL Bilateral   . COLONOSCOPY    . ELECTROPHYSIOLOGIC STUDY N/A 01/24/2016   Procedure: CARDIOVERSION;  Surgeon: [name], MD;  Location: [facility];  Service:  Cardiovascular;  Laterality: N/A;  . LAPAROSCOPIC CHOLECYSTECTOMY    . REDUCTION MAMMAPLASTY Bilateral   . TONSILLECTOMY    . TOTAL HIP ARTHROPLASTY Right 05/12/2018   Procedure: TOTAL HIP ARTHROPLASTY ANTERIOR APPROACH;  Surgeon: Paralee Cancel, MD;  Location: [facility];  Service: Orthopedics;  Laterality: Right;  70 mins    OB History    Gravida  2   Para      Term      Preterm      AB      Living  2     SAB      TAB      Ectopic      Multiple      Live Births               Home Medications    Prior to Admission medications   Medication Sig Start Date End Date Taking? Authorizing Provider  atorvastatin (LIPITOR) 10 MG tablet TAKE 1 TABLET BY MOUTH EVERYDAY AT BEDTIME 08/26/18  Yes Bedsole, Amy E, MD  bisacodyl (DULCOLAX) 10 MG suppository Place 1 suppository (10 mg total) rectally as needed for moderate constipation. 01/01/19  Yes Lavonia Drafts, MD  Calcium Carb-Ergocalciferol 500-200 MG-UNIT TABS Take 1 tablet by mouth daily.  02/19/08  Yes [provider]  diltiazem (CARDIZEM CD) 120 MG 24 hr capsule TAKE 1 CAPSULE BY MOUTH EVERY DAY 04/06/18  Yes Allred, Jeneen Rinks, MD  ELIQUIS 5 MG TABS tablet TAKE 1 TABLET BY MOUTH TWICE A DAY 03/25/19  Yes Sherran Needs, NP  escitalopram (LEXAPRO) 10 MG tablet Take 1 tablet by mouth once daily 09/21/18  Yes Bedsole, Amy E, MD  flecainide (TAMBOCOR) 50 MG tablet Take 1 tablet (50 mg total) by mouth 2 (two) times daily. 09/15/18  Yes Dunn, Ryan M, PA-C  glucose blood test strip CHECK BLOOD SUGAR 2 TIMES A DAY AS DIRECTED DX CODE E11.9 05/19/18  Yes [name], MD  linagliptin (TRADJENTA) 5 MG TABS tablet Take 1 tablet (5 mg total) by mouth daily. 11/17/18  Yes [name], MD  metFORMIN (GLUCOPHAGE-XR) 500 MG 24 hr tablet TAKE 2 TABLETS (1,000 MG TOTAL) BY MOUTH 2 (TWO) TIMES DAILY WITH A MEAL. 01/22/19  Yes [name], MD  Multiple Vitamin (MULTIVITAMIN) tablet Take 1 tablet by mouth daily.     Yes [provider]  solifenacin (VESICARE) 10 MG tablet Take 1 tablet (10 mg total) by mouth daily. 04/08/19  Yes Bedsole, Amy E, MD  traZODone (DESYREL) 50 MG tablet TAKE 1 TABLET BY MOUTH EVERYDAY AT BEDTIME 04/06/19  Yes Kasa, Maretta Bees, MD  amoxicillin (AMOXIL) 500 MG capsule TAKE 4 CAPSULES BY MOUTH 1 HR PRIOR TO DENTAL APPT 09/23/18   [provider]  cephALEXin (KEFLEX) 500 MG capsule Take 1 capsule (500 mg total) by mouth 3 (three) times daily for 5 days. 05/18/19 05/23/19  Sharion Balloon, NP    Family History Family History  Problem Relation Age of Onset  . Cancer  Mother        fallopian tube  . Dementia Mother   . Pulmonary embolism Mother   . Hyperlipidemia Mother   . Diabetes Mother   . Hyperlipidemia Father   . Lung cancer Father   . Hypertension Sister   . Diabetes Brother   . Stroke Paternal Grandmother   . Stroke Paternal Grandfather   . Colon cancer Neg Hx   . Esophageal cancer Neg Hx     Social History Social History   Tobacco Use  . Smoking status: Never Smoker  . Smokeless tobacco: Never Used  Substance Use Topics  . Alcohol use: No    Alcohol/week: 0.0 standard drinks  . Drug use: No     Allergies   Pravachol [pravastatin sodium] and Contrast media [iodinated diagnostic agents]   Review of Systems Review of Systems  Constitutional: Negative for chills and fever.  HENT: Negative for ear pain and sore throat.   Eyes: Negative for pain and visual disturbance.  Respiratory: Negative for cough and shortness of breath.   Cardiovascular: Negative for chest pain and palpitations.  Gastrointestinal: Negative for abdominal pain, diarrhea, nausea and vomiting.  Genitourinary: Positive for frequency. Negative for dysuria, hematuria, pelvic pain and vaginal discharge.  Musculoskeletal: Negative for arthralgias and back pain.  Skin: Negative for color change and rash.  Neurological: Negative for seizures and syncope.  All other systems reviewed and are  negative.    Physical Exam Triage Vital Signs ED Triage Vitals  Enc Vitals Group     BP 05/18/19 1034 (!) 148/66     Pulse Rate 05/18/19 1034 78     Resp 05/18/19 1034 18     Temp 05/18/19 1034 97.6 F (36.4 C)     Temp Source 05/18/19 1034 Oral     SpO2 05/18/19 1034 98 %     Weight 05/18/19 1032 167 lb (75.8 kg)     Height 05/18/19 1032 5\' 5"  (1.651 m)     Head Circumference --      Peak Flow --      Pain Score 05/18/19 1032 0     Pain Loc --      Pain Edu? --      Excl. in [place]? --    No data found.  Updated Vital Signs BP (!) 148/66 (BP Location: Left Arm)   Pulse 78   Temp 97.6 F (36.4 C) (Oral)   Resp 18   Ht 5\' 5"  (1.651 m)   Wt 167 lb (75.8 kg)   LMP  (LMP Unknown)   SpO2 98%   BMI 27.79 kg/m   Visual Acuity Right Eye Distance:   Left Eye Distance:   Bilateral Distance:    Right Eye Near:   Left Eye Near:    Bilateral Near:     Physical Exam Vitals and nursing note reviewed.  Constitutional:      General: She is not in acute distress.    Appearance: She is well-developed. She is not ill-appearing.  HENT:     Head: Normocephalic and atraumatic.     Mouth/Throat:     Mouth: Mucous membranes are moist.     Pharynx: Oropharynx is clear.  Eyes:     Conjunctiva/sclera: Conjunctivae normal.  Cardiovascular:     Rate and Rhythm: Normal rate and regular rhythm.     Heart sounds: No murmur.  Pulmonary:     Effort: Pulmonary effort is normal. No respiratory distress.     Breath sounds: Normal  breath sounds.  Abdominal:     General: Bowel sounds are normal.     Palpations: Abdomen is soft.     Tenderness: There is no abdominal tenderness. There is no right CVA tenderness, left CVA tenderness, guarding or rebound.  Musculoskeletal:     Cervical back: Neck supple.  Skin:    General: Skin is warm and dry.     Findings: No rash.  Neurological:     General: No focal deficit present.     Mental Status: She is alert and oriented to person, place, and  time.  Psychiatric:        Mood and Affect: Mood normal.        Behavior: Behavior normal.      UC Treatments / Results  Labs (all labs ordered are listed, but only abnormal results are displayed) Labs Reviewed  POCT URINALYSIS DIP (MANUAL ENTRY) - Abnormal; Notable for the following components:      Result Value   Clarity, UA cloudy (*)    Glucose, UA =100 (*)    Blood, UA trace-intact (*)    Leukocytes, UA Moderate (2+) (*)    All other components within normal limits  URINE CULTURE    EKG   Radiology No results found.  Procedures Procedures (including critical care time)  Medications Ordered in UC Medications - No data to display  Initial Impression / Assessment and Plan / UC Course  I have reviewed the triage vital signs and the nursing notes.  Pertinent labs & imaging results that were available during my care of the patient were reviewed by me and considered in my medical decision making (see chart for details).   UTI.  Treating with Keflex.  Urine culture pending.  This with patient that we will call her if the culture indicates the need to change her antibiotic.  Instructed her to follow-up with her PCP if her symptoms or not improving.  Patient agrees to plan of care.       Final Clinical Impressions(s) / UC Diagnoses   Final diagnoses:  Urinary tract infection without hematuria, site unspecified     Discharge Instructions     Take the antibiotic as directed.  Urine culture is pending and we will call you if the results indicate the need to change her antibiotic.    Follow-up with your primary care provider if your symptoms are not improving.       ED Prescriptions    Medication Sig Dispense Auth. Provider   cephALEXin (KEFLEX) 500 MG capsule Take 1 capsule (500 mg total) by mouth 3 (three) times daily for 5 days. 15 capsule Sharion Balloon, NP     PDMP not reviewed this encounter.   Sharion Balloon, NP 05/18/19 [phone]

## 2019-05-18 NOTE — ED Triage Notes (Addendum)
Pt presents with c/o possible bladder/kidney infection. She states she has urinary frequency and some incontinence. She denies dysuria/hematuria, abdominal pain or other urinary symptoms. She does report some LBP more in the middle of her back. She does have history of kidney infections. Pt has been taking Azo with no relief. Pt reports symptoms present for 4 days.

## 2019-05-20 LAB — URINE CULTURE: Culture: 60000 — AB

## 2019-06-04 NOTE — Progress Notes (Signed)
Cardiology Office Note    Date:  06/08/2019   ID:  [name], DOB 30-May-1945, MRN [id number]  PCP:  Jinny Sanders, MD  Cardiologist:  Kathlyn Sacramento, MD  Electrophysiologist:  Thompson Grayer, MD   Chief Complaint: Follow up  History of Present Illness:   [name] is a 74 y.o. female with history of persistent A. fib on Eliquis status post cardioversion in 01/2016,05/2016, and 9/2018status post radiofrequency catheter ablation on 12/17/2016 followed by EP/A. fib clinic, atrial tachycardia s/p DCCV 09/18/2018, pulmonary hypertension, mitral valve prolapse with mitral regurgitation, DM2, COVID-19 in 02/2019 hypertension, hyperlipidemia, hypothyroidism, anemia, obesity, OSA on CPAP, diverticulosis, GERD, and anxiety who presents for follow-up of  her A. fib.  Prior cardiac cath in 12/2008 for atypical chest pain showed no significant CAD with mildly elevated LVEDP and normal EF.She was initially diagnosed with A. fib in 09/2013 and was quite symptomatic with this. She was started on metoprolol for rate control. Echo at that time showed normal LV systolic function with mild mitral regurgitation, moderately dilated left atrium, and mild to moderate pulmonary hypertension. Given it was felt she would likely need antiarrhythmic therapy due to symptoms, nuclear stress test in 09/2013 was undertaken and was normal. With recurrence of A. fib, she was subsequently placed on flecainide and underwent cardioversion in 01/2016. She had recurrent A. fib in 05/2016 and underwent repeat successful cardioversion at that time. She has subsequently been referred to the A. fib clinic and managed by them. She had recurrent A. fib in 11/2016 and underwent a third cardioversion at that time. She then underwent radiofrequency catheter ablation on 12/17/2016. She continues to be managed by EP/A. fib clinic.   She was seen on 09/15/2018 noting a 4 to 5-day history of mild palpitations that felt different  than her prior episodes of A. fib along with some brief episodes of dizziness. After review with EP, she was noted to be in atrial tachycardia with long cycle Mobitz type I AV block. Her flecainide was increased to 50 mg bid and she underwent successful DCCV on 09/18/2018.  She was diagnosed with COVID-19 in 02/2019, not requiring hospital admission and received infusion.  Since I last saw her she has been followed by the A. fib clinic/EP, last seeing Dr. Rayann Heman on 04/05/2019, maintaining sinus rhythm.  She underwent follow-up echo in 04/2019 which showed an EF of 55 to 60%, mildly dilated left ventricular cavity, no regional wall motion normalities, normal RV systolic function and ventricular cavity size, severely dilated left atrium measuring 55 mm, mild mitral regurgitation, mild aortic valve sclerosis without evidence of stenosis.  Continued medical management was advised.  She comes in doing well from a cardiac perspective.  She notes her fatigue continues to improve following her COVID-19 diagnosis in December 2020.  No chest pain, dyspnea, palpitations, dizziness, presyncope, syncope.  No lower extremity swelling, abdominal tension, orthopnea, PND, or early satiety.  She is tolerating anticoagulation without hematochezia, melena, hemoptysis, hematemesis, or hematuria.  She was seen in the ED in 10/2018 after suffering a mechanical fall on a ladder with head CT no acute intracranial abnormality.  Otherwise, she has not had any falls.  She continues to consider possible COVID-19 vaccination.  She does not have any issues or concerns at this time.   Labs independently reviewed: 05/2019 - A1c 6.2 01/2019 - BUN 11, serum creatinine 0.88, potassium 4.7, albumin 4.2, AST/ALT normal, TC 165, TG 60, HDL 55, LDL 98, TSH normal 12/2018 -  HGB 12.3, PLT 288  Past Medical History:  Diagnosis Date  . Anemia   . Anxiety   . Colon polyps   . Constipation   . Depressive disorder, not elsewhere classified   .  Diaphragmatic hernia without mention of obstruction or gangrene   . Disorders of bursae and tendons in shoulder region, unspecified   . Diverticulosis of colon (without mention of hemorrhage)   . External hemorrhoid   . Fundic gland polyps of stomach, benign   . GERD (gastroesophageal reflux disease)   . History of hiatal hernia   . HTN (hypertension)   . Hypothyroidism   . Internal hemorrhoids without mention of complication   . Moderate mitral regurgitation   . Obesity, unspecified   . OSA on CPAP    compliant with CPAP  . Other urinary incontinence   . Persistent atrial fibrillation ([place])   . Pure hypercholesterolemia   . Type II diabetes mellitus ([place])   . Urinary frequency   . Urinary urgency   . Vitamin B 12 deficiency     Past Surgical History:  Procedure Laterality Date  . ABDOMINAL HYSTERECTOMY  1972   Have both ovaries  . ATRIAL FIBRILLATION ABLATION  12/17/2016  . ATRIAL FIBRILLATION ABLATION N/A 12/17/2016   Procedure: ATRIAL FIBRILLATION ABLATION;  Surgeon: Thompson Grayer, MD;  Location: [facility];  Service: Cardiovascular;  Laterality: N/A;  . [facility]; "Dr. [name]"  . CARDIOVERSION N/A 05/27/2016   Procedure: CARDIOVERSION;  Surgeon: [name], MD;  Location: [facility];  Service: Cardiovascular;  Laterality: N/A;  . CARDIOVERSION N/A 11/25/2016   Procedure: CARDIOVERSION;  Surgeon: [name], MD;  Location: [facility];  Service: Cardiovascular;  Laterality: N/A;  . CARDIOVERSION N/A 09/18/2018   Procedure: CARDIOVERSION;  Surgeon: [name], MD;  Location: [facility];  Service: Cardiovascular;  Laterality: N/A;  . CATARACT EXTRACTION W/ INTRAOCULAR LENS  IMPLANT, BILATERAL Bilateral   . COLONOSCOPY    . ELECTROPHYSIOLOGIC STUDY N/A 01/24/2016   Procedure: CARDIOVERSION;  Surgeon: [name], MD;  Location: [facility];  Service: Cardiovascular;  Laterality: N/A;  . LAPAROSCOPIC CHOLECYSTECTOMY    .  REDUCTION MAMMAPLASTY Bilateral   . TONSILLECTOMY    . TOTAL HIP ARTHROPLASTY Right 05/12/2018   Procedure: TOTAL HIP ARTHROPLASTY ANTERIOR APPROACH;  Surgeon: Paralee Cancel, MD;  Location: [facility];  Service: Orthopedics;  Laterality: Right;  70 mins    Current Medications: Current Meds  Medication Sig  . amoxicillin (AMOXIL) 500 MG capsule TAKE 4 CAPSULES BY MOUTH 1 HR PRIOR TO DENTAL APPT  . atorvastatin (LIPITOR) 10 MG tablet TAKE 1 TABLET BY MOUTH EVERYDAY AT BEDTIME  . Calcium Carb-Ergocalciferol 500-200 MG-UNIT TABS Take 1 tablet by mouth daily.   [name] diltiazem (CARDIZEM CD) 120 MG 24 hr capsule TAKE 1 CAPSULE BY MOUTH EVERY DAY  . ELIQUIS 5 MG TABS tablet TAKE 1 TABLET BY MOUTH TWICE A DAY  . escitalopram (LEXAPRO) 10 MG tablet Take 1 tablet by mouth once daily  . flecainide (TAMBOCOR) 50 MG tablet Take 1 tablet (50 mg total) by mouth 2 (two) times daily.  [name] glucose blood test strip CHECK BLOOD SUGAR 2 TIMES A DAY AS DIRECTED DX CODE E11.9  . linagliptin (TRADJENTA) 5 MG TABS tablet Take 1 tablet (5 mg total) by mouth daily.  . metFORMIN (GLUCOPHAGE-XR) 500 MG 24 hr tablet TAKE 2 TABLETS (1,000 MG TOTAL) BY MOUTH 2 (TWO) TIMES DAILY WITH A MEAL.  [name]  Multiple Vitamin (MULTIVITAMIN) tablet Take 1 tablet by mouth daily.    . traZODone (DESYREL) 50 MG tablet TAKE 1 TABLET BY MOUTH EVERYDAY AT BEDTIME    Allergies:   Pravachol [pravastatin sodium] and Contrast media [iodinated diagnostic agents]   Social History   Socioeconomic History  . Marital status: Married    Spouse name: Laverna Peace  . Number of children: 2  . Years of education: Not on file  . Highest education level: Not on file  Occupational History  . Occupation: retired    Fish farm manager: UNEMPLOYED  Tobacco Use  . Smoking status: Never Smoker  . Smokeless tobacco: Never Used  Substance and Sexual Activity  . Alcohol use: No    Alcohol/week: 0.0 standard drinks  . Drug use: No  . Sexual activity: Yes  Other Topics Concern  .  Not on file  Social History Narrative   Exercise walks 3 times daily   Lives with spouse in [place] - retired from employment   1 son and 1 daughter      Diet fruit, occ fast food, salads and lean meat      3 tea/day, no EtOH, no tobacco, no drugs   Social Determinants of Health   Financial Resource Strain: Low Risk   . Difficulty of Paying Living Expenses: Not very hard  Food Insecurity: Food Insecurity Present  . Worried About Charity fundraiser in the Last Year: Sometimes true  . Ran Out of Food in the Last Year: Sometimes true  Transportation Needs: No Transportation Needs  . Lack of Transportation (Medical): No  . Lack of Transportation (Non-Medical): No  Physical Activity:   . Days of Exercise per Week:   . Minutes of Exercise per Session:   Stress: No Stress Concern Present  . Feeling of Stress : Only a little  Social Connections: Unknown  . Frequency of Communication with Friends and Family: More than three times a week  . Frequency of Social Gatherings with Friends and Family: Not on file  . Attends Religious Services: Not on file  . Active Member of Clubs or Organizations: Not on file  . Attends Archivist Meetings: Not on file  . Marital Status: Not on file     Family History:  The patient's family history includes Cancer in her mother; Dementia in her mother; Diabetes in her brother and mother; Hyperlipidemia in her father and mother; Hypertension in her sister; Lung cancer in her father; Pulmonary embolism in her mother; Stroke in her paternal grandfather and paternal grandmother. There is no history of Colon cancer or Esophageal cancer.  ROS:   Review of Systems  Constitutional: Positive for malaise/fatigue. Negative for chills, diaphoresis, fever and weight loss.       Improved fatigue  HENT: Negative for congestion.   Eyes: Negative for discharge and redness.  Respiratory: Negative for cough, hemoptysis, sputum production,  shortness of breath and wheezing.   Cardiovascular: Negative for chest pain, palpitations, orthopnea, claudication, leg swelling and PND.  Gastrointestinal: Negative for abdominal pain, blood in stool, heartburn, melena, nausea and vomiting.  Genitourinary: Negative for hematuria.  Musculoskeletal: Negative for falls and myalgias.  Skin: Negative for rash.  Neurological: Negative for dizziness, tingling, tremors, sensory change, speech change, focal weakness, loss of consciousness and weakness.  Endo/Heme/Allergies: Does not bruise/bleed easily.  Psychiatric/Behavioral: Negative for substance abuse. The patient is not nervous/anxious.   All other systems reviewed and are negative.    EKGs/Labs/Other Studies Reviewed:  Studies reviewed were summarized above. The additional studies were reviewed today:  2D echo 04/15/2019: 1. Normal LV systolic function; mild LVE; mild eccentric MR; severe LAE.  2. Left ventricular ejection fraction, by estimation, is 55 to 60%. The  left ventricle has normal function. The left ventrical has no regional  wall motion abnormalities. The left ventricular internal cavity size was  mildly dilated. Left ventricular  diastolic parameters are indeterminate.  3. Right ventricular systolic function is normal. The right ventricular  size is normal.  4. Left atrial size was severely dilated.  5. The mitral valve is normal in structure and function. Mild mitral  valve regurgitation. No evidence of mitral stenosis.  6. The aortic valve is tricuspid. Aortic valve regurgitation is not  visualized. Mild aortic valve sclerosis is present, with no evidence of  aortic valve stenosis.  7. The inferior vena cava is normal in size with greater than 50%  respiratory variability, suggesting right atrial pressure of 3 mmHg.   EKG:  EKG is ordered today.  The EKG ordered today demonstrates NSR, 70 bpm, no acute ST-T changes  Recent Labs: 09/15/2018: Magnesium  1.9 01/01/2019: ALT 14; BUN 17; Creatinine, Ser 0.96; Hemoglobin 12.3; Platelets 288; Potassium 4.6; Sodium 130  Recent Lipid Panel    Component Value Date/Time   CHOL 153 08/17/2018 1209   CHOL 147 11/20/2016 0919   TRIG 92.0 08/17/2018 1209   HDL 43.80 08/17/2018 1209   HDL 47 11/20/2016 0919   CHOLHDL 3 08/17/2018 1209   VLDL 18.4 08/17/2018 1209   LDLCALC 91 08/17/2018 1209   LDLCALC 89 11/20/2016 0919   LDLDIRECT 163.1 07/04/2011 0922    PHYSICAL EXAM:    VS:  BP 120/68 (BP Location: Left Arm, Patient Position: Sitting, Cuff Size: Normal)   Pulse 70   Ht 5\' 5"  (1.651 m)   Wt 168 lb 8 oz (76.4 kg)   LMP  (LMP Unknown)   SpO2 98%   BMI 28.04 kg/m   BMI: Body mass index is 28.04 kg/m.  Physical Exam  Constitutional: She is oriented to person, place, and time. She appears well-developed and well-nourished.  HENT:  Head: Normocephalic and atraumatic.  Eyes: Right eye exhibits no discharge. Left eye exhibits no discharge.  Neck: No JVD present.  Cardiovascular: Normal rate, regular rhythm, S1 normal, S2 normal and normal heart sounds. Exam reveals no distant heart sounds, no friction rub, no midsystolic click and no opening snap.  No murmur heard. Pulses:      Posterior tibial pulses are 2+ on the right side and 2+ on the left side.  Pulmonary/Chest: Effort normal and breath sounds normal. No respiratory distress. She has no decreased breath sounds. She has no wheezes. She has no rales. She exhibits no tenderness.  Abdominal: Soft. She exhibits no distension. There is no abdominal tenderness.  Musculoskeletal:        General: No edema.     Cervical back: Normal range of motion.  Neurological: She is alert and oriented to person, place, and time.  Skin: Skin is warm and dry. No cyanosis. Nails show no clubbing.  Psychiatric: She has a normal mood and affect. Her speech is normal and behavior is normal. Judgment and thought content normal.    Wt Readings from Last 3  Encounters:  06/08/19 168 lb 8 oz (76.4 kg)  05/18/19 167 lb (75.8 kg)  05/17/19 167 lb (75.8 kg)     ASSESSMENT & PLAN:   1. Persistent A. fib/atrial tachycardia:  Maintaining sinus rhythm on flecainide and diltiazem.  CHADS2VASc of 3 (HTN, age x 1 gender).  Continue Eliquis, she is without symptoms concerning for bleeding.  Check CBC, BMP, and magnesium with recommendation to maintain potassium and magnesium at goal 4.0 and 2.0 respectively.  Follow-up with EP as directed.  2. Mitral regurgitation: Mild by most recent echo.  Continue to monitor clinically.  No murmur noted on exam.  3. Hypertension: Blood pressure is well controlled.  Continue diltiazem.  4. Hyperlipidemia: LDL of 98 from 01/2019, with normal LFT at that time.  Remains on atorvastatin 10 mg.  5. COVID-19: Follow-up echo showed no significant findings as outlined above.  She appears to be resolved from this illness.   Disposition: F/u with Dr. Fletcher Anon or an APP in 6 months, and EP as directed.   Medication Adjustments/Labs and Tests Ordered: Current medicines are reviewed at length with the patient today.  Concerns regarding medicines are outlined above. Medication changes, Labs and Tests ordered today are summarized above and listed in the Patient Instructions accessible in Encounters.   Signed, Christell Faith, PA-C 06/08/2019 9:28 AM     [facility] [address] [place] [address] [place], [place] [address] [phone]

## 2019-06-08 ENCOUNTER — Encounter: Payer: Self-pay | Admitting: Physician Assistant

## 2019-06-08 ENCOUNTER — Other Ambulatory Visit: Payer: Self-pay

## 2019-06-08 ENCOUNTER — Ambulatory Visit (INDEPENDENT_AMBULATORY_CARE_PROVIDER_SITE_OTHER): Payer: Medicare HMO | Admitting: Physician Assistant

## 2019-06-08 VITALS — BP 120/68 | HR 70 | Ht 65.0 in | Wt 168.5 lb

## 2019-06-08 DIAGNOSIS — I4819 Other persistent atrial fibrillation: Secondary | ICD-10-CM

## 2019-06-08 DIAGNOSIS — E782 Mixed hyperlipidemia: Secondary | ICD-10-CM

## 2019-06-08 DIAGNOSIS — I10 Essential (primary) hypertension: Secondary | ICD-10-CM | POA: Diagnosis not present

## 2019-06-08 DIAGNOSIS — I471 Supraventricular tachycardia: Secondary | ICD-10-CM | POA: Diagnosis not present

## 2019-06-08 DIAGNOSIS — I340 Nonrheumatic mitral (valve) insufficiency: Secondary | ICD-10-CM

## 2019-06-08 NOTE — Patient Instructions (Signed)
Medication Instructions:  No changes  *If you need a refill on your cardiac medications before your next appointment, please call your pharmacy*   Lab Work: 1- Your physician recommends that you have lab work today(BMET, Mag, CBC)  If you have labs (blood work) drawn today and your tests are completely normal, you will receive your results only by: [name] MyChart Message (if you have MyChart) OR . A paper copy in the mail If you have any lab test that is abnormal or we need to change your treatment, we will call you to review the results.   Testing/Procedures: None ordered    Follow-Up: At [facility], you and your health needs are our priority.  As part of our continuing mission to provide you with exceptional heart care, we have created designated Provider Care Teams.  These Care Teams include your primary Cardiologist (physician) and Advanced Practice Providers (APPs -  Physician Assistants and Nurse Practitioners) who all work together to provide you with the care you need, when you need it.  We recommend signing up for the patient portal called "MyChart".  Sign up information is provided on this After Visit Summary.  MyChart is used to connect with patients for Virtual Visits (Telemedicine).  Patients are able to view lab/test results, encounter notes, upcoming appointments, etc.  Non-urgent messages can be sent to your provider as well.   To learn more about what you can do with MyChart, go to [email or web address].    Your next appointment:   6 month(s)  The format for your next appointment:   In Person  Provider:    You may see Kathlyn Sacramento, MD or Christell Faith, PA-C.

## 2019-06-09 ENCOUNTER — Telehealth: Payer: Self-pay

## 2019-06-09 DIAGNOSIS — I4819 Other persistent atrial fibrillation: Secondary | ICD-10-CM

## 2019-06-09 LAB — BASIC METABOLIC PANEL
BUN/Creatinine Ratio: 18 (ref 12–28)
BUN: 17 mg/dL (ref 8–27)
CO2: 26 mmol/L (ref 20–29)
Calcium: 9.8 mg/dL (ref 8.7–10.3)
Chloride: 103 mmol/L (ref 96–106)
Creatinine, Ser: 0.94 mg/dL (ref 0.57–1.00)
GFR calc Af Amer: 70 mL/min/{1.73_m2} (ref >59)
GFR calc non Af Amer: 61 mL/min/{1.73_m2} (ref >59)
Glucose: 135 mg/dL — ABNORMAL HIGH (ref 65–99)
Potassium: 5.2 mmol/L (ref 3.5–5.2)
Sodium: 140 mmol/L (ref 134–144)

## 2019-06-09 LAB — CBC
Hematocrit: 33.5 % — ABNORMAL LOW (ref 34.0–46.6)
Hemoglobin: 11.2 g/dL (ref 11.1–15.9)
MCH: 30.1 pg (ref 26.6–33.0)
MCHC: 33.4 g/dL (ref 31.5–35.7)
MCV: 90 fL (ref 79–97)
Platelets: 274 10*3/uL (ref 150–450)
RBC: 3.72 x10E6/uL — ABNORMAL LOW (ref 3.77–5.28)
RDW: 12.8 % (ref 11.7–15.4)
WBC: 6.2 10*3/uL (ref 3.4–10.8)

## 2019-06-09 LAB — MAGNESIUM: Magnesium: 1.9 mg/dL (ref 1.6–2.3)

## 2019-06-09 NOTE — Telephone Encounter (Signed)
-----   Message from Rise Mu, PA-C sent at 06/09/2019  8:53 AM EDT ----- Random glucose is mildly elevated.  Renal function is normal. Potassium is high-normal.  Blood count is normal. Magnesium is normal.   Recommendations: -Please make sure she is not using salt substitutes or seasonings that are high in potassium -Recheck BMET in 48 hours to ensure stable potassium

## 2019-06-09 NOTE — Telephone Encounter (Signed)
Call to patient to review labs.    Pt verbalized understanding and does not report any salt substitute use.    Advised pt to call for any further questions or concerns.  Order placed for lab redraw tomorrow.

## 2019-06-10 ENCOUNTER — Other Ambulatory Visit
Admission: RE | Admit: 2019-06-10 | Discharge: 2019-06-10 | Disposition: A | Discharge status: Home / Self Care | Payer: Medicare HMO | Attending: Physician Assistant | Admitting: Physician Assistant

## 2019-06-10 ENCOUNTER — Ambulatory Visit
Admission: EM | Admit: 2019-06-10 | Discharge: 2019-06-10 | Disposition: A | Discharge status: Home / Self Care | Payer: Medicare HMO | Attending: Emergency Medicine | Admitting: Emergency Medicine

## 2019-06-10 ENCOUNTER — Encounter: Payer: Self-pay | Admitting: Emergency Medicine

## 2019-06-10 ENCOUNTER — Other Ambulatory Visit: Payer: Self-pay

## 2019-06-10 DIAGNOSIS — I4819 Other persistent atrial fibrillation: Secondary | ICD-10-CM | POA: Insufficient documentation

## 2019-06-10 DIAGNOSIS — R300 Dysuria: Secondary | ICD-10-CM | POA: Diagnosis not present

## 2019-06-10 LAB — POCT URINALYSIS DIP (MANUAL ENTRY)
Bilirubin, UA: NEGATIVE
Glucose, UA: NEGATIVE mg/dL
Ketones, POC UA: NEGATIVE mg/dL
Leukocytes, UA: NEGATIVE
Nitrite, UA: NEGATIVE
Protein Ur, POC: NEGATIVE mg/dL
Spec Grav, UA: 1.015 (ref 1.010–1.025)
Urobilinogen, UA: 0.2 E.U./dL
pH, UA: 6 (ref 5.0–8.0)

## 2019-06-10 LAB — BASIC METABOLIC PANEL
Anion gap: 8 (ref 5–15)
BUN: 20 mg/dL (ref 8–23)
CO2: 27 mmol/L (ref 22–32)
Calcium: 9.1 mg/dL (ref 8.9–10.3)
Chloride: 100 mmol/L (ref 98–111)
Creatinine, Ser: 0.92 mg/dL (ref 0.44–1.00)
GFR calc Af Amer: >60 mL/min (ref >60)
GFR calc non Af Amer: >60 mL/min (ref >60)
Glucose, Bld: 85 mg/dL (ref 70–99)
Potassium: 4.8 mmol/L (ref 3.5–5.1)
Sodium: 135 mmol/L (ref 135–145)

## 2019-06-10 NOTE — Discharge Instructions (Addendum)
Your urine does not show signs of infection.    Make sure you are drinking enough water.  Take a cranberry/Vitamin C supplement.    Follow up with your primary care provider if your symptoms are not improving.

## 2019-06-10 NOTE — ED Triage Notes (Signed)
Pt c/o urinary frequency, urinary incontinence, lower back pain. Started about 2-3 days ago.

## 2019-06-10 NOTE — ED Provider Notes (Signed)
[name]    CSN: [id number] Arrival date & time: 06/10/19  1521      History   Chief Complaint Chief Complaint  Patient presents with  . Urinary Frequency    HPI ADIYA LIVIGNI is a 74 y.o. female. Patient presents with urinary frequency, low back pain, and urinary incontinence.  She denies fever, chills, abdominal pain, vaginal symptoms, or other symptoms.  Patient has a history of Urge Incontinence.  No treatments attempted at home.  She was treated with Keflex for a UTI on 05/18/2019; the urine C&S showed sensitivity to this antibiotic.    The history is provided by the patient.    Past Medical History:  Diagnosis Date  . Anemia   . Anxiety   . Colon polyps   . Constipation   . Depressive disorder, not elsewhere classified   . Diaphragmatic hernia without mention of obstruction or gangrene   . Disorders of bursae and tendons in shoulder region, unspecified   . Diverticulosis of colon (without mention of hemorrhage)   . External hemorrhoid   . Fundic gland polyps of stomach, benign   . GERD (gastroesophageal reflux disease)   . History of hiatal hernia   . HTN (hypertension)   . Hypothyroidism   . Internal hemorrhoids without mention of complication   . Moderate mitral regurgitation   . Obesity, unspecified   . OSA on CPAP    compliant with CPAP  . Other urinary incontinence   . Persistent atrial fibrillation ([place])   . Pure hypercholesterolemia   . Type II diabetes mellitus ([place])   . Urinary frequency   . Urinary urgency   . Vitamin B 12 deficiency     Patient Active Problem List   Diagnosis Date Noted  . Atrial tachycardia ([place]) 09/15/2018  . 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 Vitamin B 12 deficiency   . Urge incontinence   . OSA on CPAP   . History of hiatal hernia   . GERD (gastroesophageal reflux disease)   . Anxiety   . Other constipation 01/02/2017  . Class 1 obesity with serious  comorbidity and body mass index (BMI) of 30.0 to 30.9 in adult 01/02/2017  . Overweight (BMI 25.0-29.9) 12/19/2016  . Persistent atrial fibrillation ([place]) 12/17/2016  . Vitamin D deficiency 11/20/2016  . Family history of ovarian cancer 05/24/2016  . Iron deficiency anemia   . Essential hypertension   . Preoperative evaluation to rule out surgical contraindication 05/16/2015  . Controlled diabetes mellitus type II without complication ([place]) [id number]  . Mitral regurgitation 10/11/2013  . Incidental lung nodule 07/16/2011  . 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 SLEEP APNEA 10/25/2008  . Hypercholesterolemia 06/11/2006  . Depression, major, in remission ([place]) 06/11/2006  . HEMORRHOIDS, INTERNAL 06/11/2006  . DIVERTICULOSIS, COLON 06/11/2006  . STRESS INCONTINENCE 06/11/2006    Past Surgical History:  Procedure Laterality Date  . ABDOMINAL HYSTERECTOMY  1972   Have both ovaries  . ATRIAL FIBRILLATION ABLATION  12/17/2016  . ATRIAL FIBRILLATION ABLATION N/A 12/17/2016   Procedure: ATRIAL FIBRILLATION ABLATION;  Surgeon: Thompson Grayer, MD;  Location: [facility];  Service: Cardiovascular;  Laterality: N/A;  . [place]   [facility]; "Dr. [name]"  . CARDIOVERSION N/A 05/27/2016   Procedure: CARDIOVERSION;  Surgeon: [name], MD;  Location: [facility];  Service: Cardiovascular;  Laterality: N/A;  . CARDIOVERSION N/A 11/25/2016   Procedure: CARDIOVERSION;  Surgeon: Kathlyn Sacramento  A, MD;  Location: [facility];  Service: Cardiovascular;  Laterality: N/A;  . CARDIOVERSION N/A 09/18/2018   Procedure: CARDIOVERSION;  Surgeon: [name], MD;  Location: [facility];  Service: Cardiovascular;  Laterality: N/A;  . CATARACT EXTRACTION W/ INTRAOCULAR LENS  IMPLANT, BILATERAL Bilateral   . COLONOSCOPY    . ELECTROPHYSIOLOGIC STUDY N/A 01/24/2016   Procedure: CARDIOVERSION;  Surgeon: [name], MD;  Location: [facility];  Service: Cardiovascular;  Laterality: N/A;  .  LAPAROSCOPIC CHOLECYSTECTOMY    . REDUCTION MAMMAPLASTY Bilateral   . TONSILLECTOMY    . TOTAL HIP ARTHROPLASTY Right 05/12/2018   Procedure: TOTAL HIP ARTHROPLASTY ANTERIOR APPROACH;  Surgeon: Paralee Cancel, MD;  Location: [facility];  Service: Orthopedics;  Laterality: Right;  70 mins    OB History    Gravida  2   Para      Term      Preterm      AB      Living  2     SAB      TAB      Ectopic      Multiple      Live Births               Home Medications    Prior to Admission medications   Medication Sig Start Date End Date Taking? Authorizing Provider  atorvastatin (LIPITOR) 10 MG tablet TAKE 1 TABLET BY MOUTH EVERYDAY AT BEDTIME 08/26/18  Yes Bedsole, Amy E, MD  Calcium Carb-Ergocalciferol 500-200 MG-UNIT TABS Take 1 tablet by mouth daily.  02/19/08  Yes [provider]  diltiazem (CARDIZEM CD) 120 MG 24 hr capsule TAKE 1 CAPSULE BY MOUTH EVERY DAY 05/18/19  Yes Allred, Jeneen Rinks, MD  ELIQUIS 5 MG TABS tablet TAKE 1 TABLET BY MOUTH TWICE A DAY 03/25/19  Yes Sherran Needs, NP  escitalopram (LEXAPRO) 10 MG tablet Take 1 tablet by mouth once daily 09/21/18  Yes Bedsole, Amy E, MD  flecainide (TAMBOCOR) 50 MG tablet Take 1 tablet (50 mg total) by mouth 2 (two) times daily. 09/15/18  Yes Dunn, Ryan M, PA-C  glucose blood test strip CHECK BLOOD SUGAR 2 TIMES A DAY AS DIRECTED DX CODE E11.9 05/19/18  Yes [name], MD  linagliptin (TRADJENTA) 5 MG TABS tablet Take 1 tablet (5 mg total) by mouth daily. 11/17/18  Yes [name], MD  metFORMIN (GLUCOPHAGE-XR) 500 MG 24 hr tablet TAKE 2 TABLETS (1,000 MG TOTAL) BY MOUTH 2 (TWO) TIMES DAILY WITH A MEAL. 01/22/19  Yes [name], MD  Multiple Vitamin (MULTIVITAMIN) tablet Take 1 tablet by mouth daily.     Yes [provider]  traZODone (DESYREL) 50 MG tablet TAKE 1 TABLET BY MOUTH EVERYDAY AT BEDTIME 04/06/19  Yes Kasa, Maretta Bees, MD  amoxicillin (AMOXIL) 500 MG capsule TAKE 4 CAPSULES BY MOUTH 1 HR  PRIOR TO DENTAL APPT 09/23/18   [provider]    Family History Family History  Problem Relation Age of Onset  . Cancer Mother        fallopian tube  . Dementia Mother   . Pulmonary embolism Mother   . Hyperlipidemia Mother   . Diabetes Mother   . Hyperlipidemia Father   . Lung cancer Father   . Hypertension Sister   . Diabetes Brother   . Stroke Paternal Grandmother   . Stroke Paternal Grandfather   . Colon cancer Neg Hx   . Esophageal cancer Neg Hx     Social History Social  History   Tobacco Use  . Smoking status: Never Smoker  . Smokeless tobacco: Never Used  Substance Use Topics  . Alcohol use: No    Alcohol/week: 0.0 standard drinks  . Drug use: No     Allergies   Pravachol [pravastatin sodium] and Contrast media [iodinated diagnostic agents]   Review of Systems Review of Systems  Constitutional: Negative for chills and fever.  HENT: Negative for ear pain and sore throat.   Eyes: Negative for pain and visual disturbance.  Respiratory: Negative for cough and shortness of breath.   Cardiovascular: Negative for chest pain and palpitations.  Gastrointestinal: Negative for abdominal pain and vomiting.  Genitourinary: Positive for frequency. Negative for hematuria and vaginal discharge.  Musculoskeletal: Positive for back pain. Negative for arthralgias.  Skin: Negative for color change and rash.  Neurological: Negative for seizures and syncope.  All other systems reviewed and are negative.    Physical Exam Triage Vital Signs ED Triage Vitals  Enc Vitals Group     BP      Pulse      Resp      Temp      Temp src      SpO2      Weight      Height      Head Circumference      Peak Flow      Pain Score      Pain Loc      Pain Edu?      Excl. in [place]?    No data found.  Updated Vital Signs BP 137/76 (BP Location: Left Arm)   Pulse 75   Temp 99 F (37.2 C) (Oral)   Resp 18   Ht 5\' 5"  (1.651 m)   Wt 169 lb (76.7 kg)   LMP  (LMP  Unknown)   SpO2 97%   BMI 28.12 kg/m   Visual Acuity Right Eye Distance:   Left Eye Distance:   Bilateral Distance:    Right Eye Near:   Left Eye Near:    Bilateral Near:     Physical Exam Vitals and nursing note reviewed.  Constitutional:      General: She is not in acute distress.    Appearance: She is well-developed. She is not ill-appearing.  HENT:     Head: Normocephalic and atraumatic.     Mouth/Throat:     Mouth: Mucous membranes are moist.     Pharynx: Oropharynx is clear.  Eyes:     Conjunctiva/sclera: Conjunctivae normal.  Cardiovascular:     Rate and Rhythm: Normal rate and regular rhythm.     Heart sounds: No murmur.  Pulmonary:     Effort: Pulmonary effort is normal. No respiratory distress.     Breath sounds: Normal breath sounds.  Abdominal:     General: Bowel sounds are normal.     Palpations: Abdomen is soft.     Tenderness: There is no abdominal tenderness. There is no right CVA tenderness, left CVA tenderness, guarding or rebound.  Musculoskeletal:     Cervical back: Neck supple.  Skin:    General: Skin is warm and dry.     Findings: No rash.  Neurological:     General: No focal deficit present.     Mental Status: She is alert and oriented to person, place, and time.     Sensory: No sensory deficit.     Motor: No weakness.     Gait: Gait normal.  Psychiatric:  Mood and Affect: Mood normal.        Behavior: Behavior normal.      UC Treatments / Results  Labs (all labs ordered are listed, but only abnormal results are displayed) Labs Reviewed  POCT URINALYSIS DIP (MANUAL ENTRY) - Abnormal; Notable for the following components:      Result Value   Blood, UA trace-intact (*)    All other components within normal limits    EKG   Radiology No results found.  Procedures Procedures (including critical care time)  Medications Ordered in UC Medications - No data to display  Initial Impression / Assessment and Plan / UC Course    I have reviewed the triage vital signs and the nursing notes.  Pertinent labs & imaging results that were available during my care of the patient were reviewed by me and considered in my medical decision making (see chart for details).   Dysuria.  Urine does not indicate infection.  Instructed patient to stay hydrated with water and to consider an OTC cranberry/vitamin C supplement.  Instructed her to follow-up with her PCP if her symptoms or not improving.  Patient agrees to plan of care.     Final Clinical Impressions(s) / UC Diagnoses   Final diagnoses:  Dysuria     Discharge Instructions     Your urine does not show signs of infection.    Make sure you are drinking enough water.  Take a cranberry/Vitamin C supplement.    Follow up with your primary care provider if your symptoms are not improving.         ED Prescriptions    None     PDMP not reviewed this encounter.   Sharion Balloon, NP 06/10/19 1554

## 2019-07-01 ENCOUNTER — Other Ambulatory Visit: Payer: Self-pay | Admitting: Internal Medicine

## 2019-07-14 DIAGNOSIS — Z96641 Presence of right artificial hip joint: Secondary | ICD-10-CM | POA: Diagnosis not present

## 2019-07-14 DIAGNOSIS — Z471 Aftercare following joint replacement surgery: Secondary | ICD-10-CM | POA: Diagnosis not present

## 2019-07-15 DIAGNOSIS — R69 Illness, unspecified: Secondary | ICD-10-CM | POA: Diagnosis not present

## 2019-07-19 ENCOUNTER — Other Ambulatory Visit: Payer: Self-pay | Admitting: Family Medicine

## 2019-07-19 DIAGNOSIS — Z1231 Encounter for screening mammogram for malignant neoplasm of breast: Secondary | ICD-10-CM

## 2019-07-21 ENCOUNTER — Other Ambulatory Visit: Payer: Self-pay

## 2019-07-21 ENCOUNTER — Encounter: Payer: Self-pay | Admitting: Family Medicine

## 2019-07-21 ENCOUNTER — Ambulatory Visit (INDEPENDENT_AMBULATORY_CARE_PROVIDER_SITE_OTHER): Payer: Medicare HMO | Admitting: Family Medicine

## 2019-07-21 VITALS — BP 122/66 | HR 82 | Temp 98.0°F | Ht 64.75 in | Wt 169.0 lb

## 2019-07-21 DIAGNOSIS — H9202 Otalgia, left ear: Secondary | ICD-10-CM | POA: Insufficient documentation

## 2019-07-21 MED ORDER — AMOXICILLIN-POT CLAVULANATE 875-125 MG PO TABS: 1.0000 | ORAL_TABLET | Freq: Two times a day (BID) | ORAL | 0 refills | Status: DC

## 2019-07-21 NOTE — Patient Instructions (Signed)
#  Ear pain - Try Pseudoephedrine and Flonase (nasal spray) - take x 3 days  - I will send in Antibiotics to take if symptoms do not improve or if you develop fevers or worsening pain

## 2019-07-21 NOTE — Assessment & Plan Note (Signed)
Likely eustachian tube dysfunction. Trial of flonase and pseudoephedrine for 3 days. If no change or worsening symptoms (fever) recommended starting antibiotics. If improving no need for antibiotics.

## 2019-07-21 NOTE — Progress Notes (Signed)
Subjective:     [name] is a 74 y.o. female presenting for Ear Pain (C/o left ear pain.  Started about 2 days ago.  Tried peroxide, helpful. )     Otalgia  There is pain in the left ear. This is a new problem. The current episode started in the past 7 days. The problem occurs every few minutes. The problem has been gradually improving. There has been no fever. Associated symptoms include headaches. Pertinent negatives include no coughing, diarrhea, ear discharge, hearing loss (slightly worse than baseline), neck pain, rhinorrhea, sore throat or vomiting. She has tried NSAIDs (peroxide) for the symptoms. The treatment provided moderate relief. Her past medical history is significant for hearing loss.     Review of Systems  HENT: Positive for ear pain. Negative for ear discharge, hearing loss (slightly worse than baseline), rhinorrhea, sore throat and tinnitus.        Ear fullness  Respiratory: Negative for cough.   Gastrointestinal: Negative for diarrhea and vomiting.  Musculoskeletal: Negative for neck pain.  Neurological: Positive for headaches.     Social History   Tobacco Use  Smoking Status Never Smoker  Smokeless Tobacco Never Used        Objective:    BP Readings from Last 3 Encounters:  07/21/19 122/66  06/10/19 137/76  06/08/19 120/68   Wt Readings from Last 3 Encounters:  07/21/19 169 lb (76.7 kg)  06/10/19 169 lb (76.7 kg)  06/08/19 168 lb 8 oz (76.4 kg)    BP 122/66 (BP Location: Left Arm, Patient Position: Sitting, Cuff Size: Normal)   Pulse 82   Temp 98 F (36.7 C) (Temporal)   Ht 5' 4.75" (1.645 m)   Wt 169 lb (76.7 kg)   LMP  (LMP Unknown)   SpO2 99%   BMI 28.34 kg/m    Physical Exam Constitutional:      General: She is not in acute distress.    Appearance: She is well-developed. She is not diaphoretic.  HENT:     Head: Normocephalic and atraumatic.     Right Ear: Tympanic membrane and ear canal normal.     Left Ear: Ear canal  normal. A middle ear effusion is present. Tympanic membrane is not injected, erythematous or bulging.     Nose: Mucosal edema present. No rhinorrhea.     Right Sinus: No maxillary sinus tenderness or frontal sinus tenderness.     Left Sinus: No maxillary sinus tenderness or frontal sinus tenderness.     Mouth/Throat:     Pharynx: Uvula midline. No oropharyngeal exudate or posterior oropharyngeal erythema.     Tonsils: 0 on the right. 0 on the left.  Eyes:     General: No scleral icterus.    Conjunctiva/sclera: Conjunctivae normal.  Cardiovascular:     Rate and Rhythm: Normal rate and regular rhythm.     Heart sounds: Normal heart sounds. No murmur.  Pulmonary:     Effort: Pulmonary effort is normal. No respiratory distress.     Breath sounds: Normal breath sounds.  Musculoskeletal:     Cervical back: Neck supple.  Lymphadenopathy:     Cervical: No cervical adenopathy.  Skin:    General: Skin is warm and dry.     Capillary Refill: Capillary refill takes less than 2 seconds.  Neurological:     Mental Status: She is alert.           Assessment & Plan:   Problem List Items Addressed This Visit  Other   Ear pain, left - Primary    Likely eustachian tube dysfunction. Trial of flonase and pseudoephedrine for 3 days. If no change or worsening symptoms (fever) recommended starting antibiotics. If improving no need for antibiotics.       Relevant Medications   amoxicillin-clavulanate (AUGMENTIN) 875-125 MG tablet       Return if symptoms worsen or fail to improve.  Lesleigh Noe, MD

## 2019-07-22 ENCOUNTER — Ambulatory Visit
Admission: RE | Admit: 2019-07-22 | Discharge: 2019-07-22 | Disposition: A | Discharge status: Home / Self Care | Payer: Medicare HMO | Source: Ambulatory Visit | Attending: Family Medicine | Admitting: Family Medicine

## 2019-07-22 DIAGNOSIS — Z1231 Encounter for screening mammogram for malignant neoplasm of breast: Secondary | ICD-10-CM | POA: Diagnosis not present

## 2019-07-23 ENCOUNTER — Other Ambulatory Visit: Payer: Self-pay | Admitting: Family Medicine

## 2019-07-23 NOTE — Telephone Encounter (Signed)
Please call and schedule MWV with nurse and CPE with Dr. Bedsole. 

## 2019-07-26 ENCOUNTER — Other Ambulatory Visit: Payer: Self-pay

## 2019-07-26 ENCOUNTER — Encounter (HOSPITAL_COMMUNITY): Payer: Self-pay | Admitting: *Deleted

## 2019-07-26 ENCOUNTER — Emergency Department (HOSPITAL_COMMUNITY): Payer: Medicare HMO

## 2019-07-26 ENCOUNTER — Telehealth (HOSPITAL_COMMUNITY): Payer: Self-pay | Admitting: *Deleted

## 2019-07-26 ENCOUNTER — Emergency Department (HOSPITAL_COMMUNITY)
Admission: EM | Admit: 2019-07-26 | Discharge: 2019-07-26 | Disposition: A | Discharge status: Home / Self Care | Payer: Medicare HMO | Attending: Emergency Medicine | Admitting: Emergency Medicine

## 2019-07-26 DIAGNOSIS — R112 Nausea with vomiting, unspecified: Secondary | ICD-10-CM | POA: Diagnosis not present

## 2019-07-26 DIAGNOSIS — Z7901 Long term (current) use of anticoagulants: Secondary | ICD-10-CM | POA: Diagnosis not present

## 2019-07-26 DIAGNOSIS — R42 Dizziness and giddiness: Secondary | ICD-10-CM | POA: Diagnosis not present

## 2019-07-26 DIAGNOSIS — N3000 Acute cystitis without hematuria: Secondary | ICD-10-CM | POA: Diagnosis not present

## 2019-07-26 DIAGNOSIS — I10 Essential (primary) hypertension: Secondary | ICD-10-CM | POA: Diagnosis not present

## 2019-07-26 DIAGNOSIS — R110 Nausea: Secondary | ICD-10-CM | POA: Diagnosis not present

## 2019-07-26 DIAGNOSIS — E119 Type 2 diabetes mellitus without complications: Secondary | ICD-10-CM | POA: Diagnosis not present

## 2019-07-26 DIAGNOSIS — S0990XA Unspecified injury of head, initial encounter: Secondary | ICD-10-CM | POA: Insufficient documentation

## 2019-07-26 DIAGNOSIS — Y929 Unspecified place or not applicable: Secondary | ICD-10-CM | POA: Diagnosis not present

## 2019-07-26 DIAGNOSIS — W1830XA Fall on same level, unspecified, initial encounter: Secondary | ICD-10-CM | POA: Insufficient documentation

## 2019-07-26 DIAGNOSIS — Y999 Unspecified external cause status: Secondary | ICD-10-CM | POA: Insufficient documentation

## 2019-07-26 DIAGNOSIS — R55 Syncope and collapse: Secondary | ICD-10-CM | POA: Diagnosis not present

## 2019-07-26 DIAGNOSIS — R0602 Shortness of breath: Secondary | ICD-10-CM | POA: Diagnosis not present

## 2019-07-26 DIAGNOSIS — I4891 Unspecified atrial fibrillation: Secondary | ICD-10-CM | POA: Diagnosis not present

## 2019-07-26 DIAGNOSIS — R002 Palpitations: Secondary | ICD-10-CM

## 2019-07-26 DIAGNOSIS — R197 Diarrhea, unspecified: Secondary | ICD-10-CM | POA: Insufficient documentation

## 2019-07-26 DIAGNOSIS — R0902 Hypoxemia: Secondary | ICD-10-CM | POA: Diagnosis not present

## 2019-07-26 DIAGNOSIS — Y939 Activity, unspecified: Secondary | ICD-10-CM | POA: Insufficient documentation

## 2019-07-26 DIAGNOSIS — W19XXXA Unspecified fall, initial encounter: Secondary | ICD-10-CM

## 2019-07-26 HISTORY — DX: Essential (primary) hypertension: I10

## 2019-07-26 HISTORY — DX: Type 2 diabetes mellitus without complications: E11.9

## 2019-07-26 LAB — COMPREHENSIVE METABOLIC PANEL
ALT: 13 U/L (ref 0–44)
AST: 15 U/L (ref 15–41)
Albumin: 3.6 g/dL (ref 3.5–5.0)
Alkaline Phosphatase: 49 U/L (ref 38–126)
Anion gap: 11 (ref 5–15)
BUN: 18 mg/dL (ref 8–23)
CO2: 23 mmol/L (ref 22–32)
Calcium: 8.6 mg/dL — ABNORMAL LOW (ref 8.9–10.3)
Chloride: 95 mmol/L — ABNORMAL LOW (ref 98–111)
Creatinine, Ser: 0.88 mg/dL (ref 0.44–1.00)
GFR calc Af Amer: >60 mL/min (ref >60)
GFR calc non Af Amer: >60 mL/min (ref >60)
Glucose, Bld: 131 mg/dL — ABNORMAL HIGH (ref 70–99)
Potassium: 4.8 mmol/L (ref 3.5–5.1)
Sodium: 129 mmol/L — ABNORMAL LOW (ref 135–145)
Total Bilirubin: 0.6 mg/dL (ref 0.3–1.2)
Total Protein: 6 g/dL — ABNORMAL LOW (ref 6.5–8.1)

## 2019-07-26 LAB — CBC WITH DIFFERENTIAL/PLATELET
Abs Immature Granulocytes: 0.03 10*3/uL (ref 0.00–0.07)
Basophils Absolute: 0 10*3/uL (ref 0.0–0.1)
Basophils Relative: 0 %
Eosinophils Absolute: 0 10*3/uL (ref 0.0–0.5)
Eosinophils Relative: 0 %
HCT: 35.9 % — ABNORMAL LOW (ref 36.0–46.0)
Hemoglobin: 11.7 g/dL — ABNORMAL LOW (ref 12.0–15.0)
Immature Granulocytes: 0 %
Lymphocytes Relative: 14 %
Lymphs Abs: 1.1 10*3/uL (ref 0.7–4.0)
MCH: 29.3 pg (ref 26.0–34.0)
MCHC: 32.6 g/dL (ref 30.0–36.0)
MCV: 90 fL (ref 80.0–100.0)
Monocytes Absolute: 0.4 10*3/uL (ref 0.1–1.0)
Monocytes Relative: 5 %
Neutro Abs: 6.3 10*3/uL (ref 1.7–7.7)
Neutrophils Relative %: 81 %
Platelets: 316 10*3/uL (ref 150–400)
RBC: 3.99 MIL/uL (ref 3.87–5.11)
RDW: 12.6 % (ref 11.5–15.5)
WBC: 7.9 10*3/uL (ref 4.0–10.5)
nRBC: 0 % (ref 0.0–0.2)

## 2019-07-26 LAB — URINALYSIS, ROUTINE W REFLEX MICROSCOPIC
Bilirubin Urine: NEGATIVE
Glucose, UA: NEGATIVE mg/dL
Hgb urine dipstick: NEGATIVE
Ketones, ur: NEGATIVE mg/dL
Nitrite: POSITIVE — AB
Protein, ur: NEGATIVE mg/dL
Specific Gravity, Urine: 1.012 (ref 1.005–1.030)
WBC, UA: >50 WBC/hpf — ABNORMAL HIGH (ref 0–5)
pH: 5 (ref 5.0–8.0)

## 2019-07-26 LAB — LACTIC ACID, PLASMA
Lactic Acid, Venous: 1.1 mmol/L (ref 0.5–1.9)
Lactic Acid, Venous: 1 mmol/L (ref 0.5–1.9)

## 2019-07-26 LAB — MAGNESIUM: Magnesium: 1.4 mg/dL — ABNORMAL LOW (ref 1.7–2.4)

## 2019-07-26 LAB — BRAIN NATRIURETIC PEPTIDE: B Natriuretic Peptide: 235.5 pg/mL — ABNORMAL HIGH (ref 0.0–100.0)

## 2019-07-26 LAB — TSH: TSH: 1.728 u[IU]/mL (ref 0.350–4.500)

## 2019-07-26 LAB — TROPONIN I (HIGH SENSITIVITY)
Troponin I (High Sensitivity): 8 ng/L (ref <18)
Troponin I (High Sensitivity): 9 ng/L (ref <18)

## 2019-07-26 LAB — LIPASE, BLOOD: Lipase: 30 U/L (ref 11–51)

## 2019-07-26 MED ORDER — CEPHALEXIN 500 MG PO CAPS
500.0000 mg | ORAL_CAPSULE | Freq: Two times a day (BID) | ORAL | 0 refills | Status: AC
Start: 2019-07-26 — End: 2019-08-02

## 2019-07-26 MED ORDER — CEPHALEXIN 250 MG PO CAPS
500.0000 mg | ORAL_CAPSULE | Freq: Once | ORAL | Status: AC
  Administered 2019-07-26: 500 mg via ORAL
  Filled 2019-07-26: qty 2

## 2019-07-26 MED ORDER — SODIUM CHLORIDE 0.9 % IV BOLUS
500.0000 mL | Freq: Once | INTRAVENOUS | Status: AC
  Administered 2019-07-26: 500 mL via INTRAVENOUS

## 2019-07-26 MED ORDER — MAGNESIUM OXIDE 400 (241.3 MG) MG PO TABS
200.0000 mg | ORAL_TABLET | Freq: Once | ORAL | Status: AC
  Administered 2019-07-26: 200 mg via ORAL
  Filled 2019-07-26: qty 1

## 2019-07-26 NOTE — Progress Notes (Signed)
Orthopedic Tech Progress Note Patient Details:  [name] 1945/09/17 [id number] Level 2 trauma Patient ID: [name], female   DOB: 12/08/1945, 74 y.o.   MRN: [id number]   Chip Boer 07/26/2019, 7:10 PM

## 2019-07-26 NOTE — Telephone Encounter (Signed)
Patient called in stating went into AFib last night HR in th 140s then she took an extra 50mg  of flecainide. This morning BP 77/37 HR 121 feeling lightheaded - hasn't taken any meds this morning -- Discussed with [name] NP - will take 100mg  of flecainide increase water intake -at lunch if BP improved take cardizem cd at that time - pt to call back this afternoon with update. Pt in agreement.

## 2019-07-26 NOTE — Discharge Instructions (Addendum)
Your work-up today revealed evidence of urinary tract infection with may have contributed to the lightheadedness and near syncopal episode you had.  Your imaging of your head showed no acute fracture, dislocation, or bleeding.  You may have had a mild concussion given the headache but he otherwise appeared well for over 4 hours.  Your telemetry monitoring did show you had a brief episode of A. fib and that resolved likely with the fluids.  I suspect you are dehydrated with a nausea vomiting, and diarrhea.  Please rest and stay hydrated.  Please take the antibiotics for the UTI.  Please follow-up with your primary care physician in the neck several days as well as her cardiologist.  If any symptoms change or worsen, please return to the nearest emergency department.

## 2019-07-26 NOTE — Telephone Encounter (Signed)
Patient continues to not feel well -- dizziness, weakness, intermittent nausea - BP continues to be 89/43 HR 111. Unable to take scheduled diltiazem due to soft blood pressure - Emily Palau NP does not feel comfortable with pt taking flecainide without oral diltiazem to oppose -- recommends patient report to ER for further assessment possible cardioversion in ER to stablize patient. Pt and husband verbalize understanding and agreement.

## 2019-07-26 NOTE — ED Provider Notes (Signed)
[facility] EMERGENCY DEPARTMENT Provider Note   CSN: [id number] Arrival date & time: 07/26/19  1834     History Chief Complaint  Patient presents with  . Fall    on blood thinners    [name] is a 74 y.o. female.  The history is provided by the patient, the EMS personnel and medical records. No language interpreter was used.  Near Syncope This is a new problem. The problem occurs rarely. The problem has not changed since onset.Associated symptoms include chest pain (with fast HR), headaches and shortness of breath. Pertinent negatives include no abdominal pain. The symptoms are aggravated by standing. Nothing relieves the symptoms. She has tried nothing for the symptoms. The treatment provided no relief.       Past Medical History:  Diagnosis Date  . Atrial fibrillation ([place])   . Diabetes mellitus without complication ([place])   . Hypertension     There are no problems to display for this patient.   Past Surgical History:  Procedure Laterality Date  . ABDOMINAL HYSTERECTOMY       OB History   No obstetric history on file.     No family history on file.  Social History   Tobacco Use  . Smoking status: Not on file  Substance Use Topics  . Alcohol use: Not on file  . Drug use: Not on file    Home Medications Prior to Admission medications   Not on File    Allergies    Patient has no allergy information on record.  Review of Systems   Review of Systems  Constitutional: Positive for fatigue. Negative for chills, diaphoresis and fever.  HENT: Negative for congestion.   Eyes: Negative for visual disturbance.  Respiratory: Positive for chest tightness and shortness of breath. Negative for cough and wheezing.   Cardiovascular: Positive for chest pain (with fast HR) and near-syncope. Negative for palpitations and leg swelling.  Gastrointestinal: Positive for diarrhea, nausea and vomiting. Negative for abdominal distention, abdominal pain  and constipation.  Genitourinary: Negative for dysuria and flank pain.  Musculoskeletal: Negative for back pain, neck pain and neck stiffness.  Neurological: Positive for light-headedness and headaches. Negative for dizziness, seizures, syncope, facial asymmetry and weakness.  Psychiatric/Behavioral: Negative for agitation and confusion.  All other systems reviewed and are negative.   Physical Exam Updated Vital Signs There were no vitals taken for this visit.  Physical Exam Vitals and nursing note reviewed.  Constitutional:      General: She is not in acute distress.    Appearance: She is well-developed. She is not ill-appearing, toxic-appearing or diaphoretic.  HENT:     Head: Normocephalic and atraumatic.     Nose: Nose normal. No congestion or rhinorrhea.     Mouth/Throat:     Mouth: Mucous membranes are dry.     Pharynx: No oropharyngeal exudate or posterior oropharyngeal erythema.  Eyes:     Extraocular Movements: Extraocular movements intact.     Conjunctiva/sclera: Conjunctivae normal.     Pupils: Pupils are equal, round, and reactive to light.  Cardiovascular:     Rate and Rhythm: Normal rate and regular rhythm.     Pulses: Normal pulses.     Heart sounds: Murmur present.  Pulmonary:     Effort: Pulmonary effort is normal. No respiratory distress.     Breath sounds: Normal breath sounds. No wheezing, rhonchi or rales.  Chest:     Chest wall: No tenderness.  Abdominal:  Palpations: Abdomen is soft.     Tenderness: There is no abdominal tenderness.  Musculoskeletal:        General: No tenderness.     Cervical back: Neck supple. No tenderness.     Right lower leg: No edema.     Left lower leg: No edema.  Skin:    General: Skin is warm and dry.     Capillary Refill: Capillary refill takes less than 2 seconds.     Findings: No erythema.  Neurological:     General: No focal deficit present.     Mental Status: She is alert.     Sensory: No sensory deficit.      Motor: No weakness.  Psychiatric:        Mood and Affect: Mood normal.     ED Results / Procedures / Treatments   Labs (all labs ordered are listed, but only abnormal results are displayed) Labs Reviewed  CBC WITH DIFFERENTIAL/PLATELET - Abnormal; Notable for the following components:      Result Value   Hemoglobin 11.7 (*)    HCT 35.9 (*)    All other components within normal limits  COMPREHENSIVE METABOLIC PANEL - Abnormal; Notable for the following components:   Sodium 129 (*)    Chloride 95 (*)    Glucose, Bld 131 (*)    Calcium 8.6 (*)    Total Protein 6.0 (*)    All other components within normal limits  BRAIN NATRIURETIC PEPTIDE - Abnormal; Notable for the following components:   B Natriuretic Peptide 235.5 (*)    All other components within normal limits  URINALYSIS, ROUTINE W REFLEX MICROSCOPIC - Abnormal; Notable for the following components:   APPearance CLOUDY (*)    Nitrite POSITIVE (*)    Leukocytes,Ua LARGE (*)    WBC, UA >50 (*)    Bacteria, UA RARE (*)    Non Squamous Epithelial 0-5 (*)    All other components within normal limits  MAGNESIUM - Abnormal; Notable for the following components:   Magnesium 1.4 (*)    All other components within normal limits  URINE CULTURE  LACTIC ACID, PLASMA  LACTIC ACID, PLASMA  LIPASE, BLOOD  TSH  TROPONIN I (HIGH SENSITIVITY)  TROPONIN I (HIGH SENSITIVITY)    EKG EKG Interpretation  Date/Time:  Monday Jul 26 2019 18:47:46 EDT Ventricular Rate:  92 PR Interval:  136 QRS Duration: 98 QT Interval:  364 QTC Calculation: 450 R Axis:   59 Text Interpretation: Normal sinus rhythm Incomplete right bundle branch block Borderline ECG When comapred to prior, now sinus rhythm No STEMI Confirmed by Antony Blackbird [phone]) on 07/26/2019 7:13:19 PM   Radiology DG Chest 2 View  Result Date: 07/26/2019 CLINICAL DATA:  Near syncope. EXAM: CHEST - 2 VIEW COMPARISON:  Most recent radiograph 01/23/2016 FINDINGS: The  cardiomediastinal contours are normal. The lungs are clear. Pulmonary vasculature is normal. No consolidation, pleural effusion, or pneumothorax. No acute osseous abnormalities are seen. Chronic degenerative change of both shoulders. IMPRESSION: No acute chest findings. Electronically Signed   By: Keith Rake M.D.   On: 07/26/2019 19:34   CT Head Wo Contrast  Result Date: 07/26/2019 CLINICAL DATA:  Status post fall. EXAM: CT HEAD WITHOUT CONTRAST TECHNIQUE: Contiguous axial images were obtained from the base of the skull through the vertex without intravenous contrast. COMPARISON:  October 12, 2018 FINDINGS: Brain: There is mild cerebral atrophy with widening of the extra-axial spaces and ventricular dilatation. There are areas of decreased attenuation  within the white matter tracts of the supratentorial brain, consistent with microvascular disease changes. Vascular: No hyperdense vessel or unexpected calcification. Skull: Normal. Negative for fracture. A stable 7.7 mm x 6.1 mm cortical density is seen adjacent to the inner table of the skull within the right frontal region. Sinuses/Orbits: No acute finding. Other: None. IMPRESSION: 1. Generalized cerebral atrophy. 2. No acute intracranial abnormality. Electronically Signed   By: Virgina Norfolk M.D.   On: 07/26/2019 20:43    Procedures Procedures (including critical care time)  Medications Ordered in ED Medications  magnesium oxide (MAG-OX) tablet 200 mg (has no administration in time range)  cephALEXin (KEFLEX) capsule 500 mg (has no administration in time range)  sodium chloride 0.9 % bolus 500 mL (0 mLs Intravenous Stopped 07/26/19 2146)    ED Course  I have reviewed the triage vital signs and the nursing notes.  Pertinent labs & imaging results that were available during my care of the patient were reviewed by me and considered in my medical decision making (see chart for details).    MDM Rules/Calculators/A&P                       [name] is a 74 y.o. female with a past medical history significant for chronic atrial fibrillation on Eliquis therapy, diabetes, and hypertension who presents with several days of nausea, vomiting, diarrhea, lightheadedness, and had a near syncopal episode causing her to fall hitting her head and EMS brought her in as a level 2 trauma for fall with head injury on blood thinners.  Patient reports that for last few days she has been having the GI symptoms and has felt somewhat dehydrated.  She reports that when she stood up today, she get very lightheaded and felt she was going to pass out.  She did not pass out but got lightheaded and lost her balance causing her to fall.  She did hit the back of her head but did not lose consciousness.  She is reporting mild to moderate headache.  No laceration was reported and no bleeding.  Patient is denying any neck pain or neck stiffness.  She does report that she has been having intermittent episodes of fast heart rate with heart rates in the 140s at home and she has been having some chest tightness and shortness of breath with this.  She does report that she has had the GI symptoms last few days.  On arrival, patient did not have any laceration to the back of her head.  Neck was completely nontender with normal neck range of motion.  Lungs were clear and chest was nontender.  Abdomen was nontender.  Mild murmur was appreciated.  Legs are nontender nonedematous.  No other evidence of trauma seen.  EKG on arrival shows a sinus rhythm.  No STEMI.  After discussion with the patient, will get a head CT given the fall on blood thinners we will also get work-up to look for electrolyte balance, dehydration, or other abnormalities in the setting of the near syncopal episode and the likely intermittent A. fib with RVR with tachycardia at home.  Anticipate reassessment after work-up.  9:17 PM Nursing just reported to me the patient started having palpitations and  some chest discomfort again.  It was 2 out of 10 in severity.  Review of the telemetry shows that she went into A. fib with RVR with a rate in the one forties.  Only lasted several minutes and went  away.  She reports the chest pain has improved.  She is still getting some fluids.  We are waiting to see what her second troponin and other labs show.  If her work-up is reassuring, anticipate discharge home after our conversation.  11:05 PM Patient's work-up did show evidence of urinary tract infection.  Given the patient's symptoms today, I do want to treat her with antibiotics.  Patient has had remarkable stability for over 4 hours with no further lightheadedness or syncopal episodes.  She was able to ambulate without difficulty.  She did have the episode of palpitations and telemetry monitoring showed a run of A. fib.  Patient is no longer in A. fib and is feeling better.  She would like to go home.  CT head showed no traumatic intracranial injury.  Other labs overall reassuring.  Slight decrease in magnesium, we will orally replete.  She does not want a nausea medicine.  She would like to go home with antibiotics and follow-up with her PCP and cardiologist.  She understood extremely strict return precautions.  Patient discharged in good condition.   Final Clinical Impression(s) / ED Diagnoses Final diagnoses:  Near syncope  Fall, initial encounter  Injury of head, initial encounter  Acute cystitis without hematuria  Nausea vomiting and diarrhea  Palpitations  Hypomagnesemia    Rx / DC Orders ED Discharge Orders         Ordered    cephALEXin (KEFLEX) 500 MG capsule  2 times daily     07/26/19 2311          Clinical Impression: 1. Near syncope   2. Fall, initial encounter   3. Injury of head, initial encounter   4. Acute cystitis without hematuria   5. Nausea vomiting and diarrhea   6. Palpitations   7. Hypomagnesemia     Disposition: Discharge  Condition: Good  I have  discussed the results, Dx and Tx plan with the pt(& family if present). He/she/they expressed understanding and agree(s) with the plan. Discharge instructions discussed at great length. Strict return precautions discussed and pt &/or family have verbalized understanding of the instructions. No further questions at time of discharge.    New Prescriptions   CEPHALEXIN (KEFLEX) 500 MG CAPSULE    Take 1 capsule (500 mg total) by mouth 2 (two) times daily for 7 days.    Follow Up: Jinny Sanders, MD [place] [address] [place] [address] [id number] [place] [phone]    your cardiologist        Deshanta Lady, Gwenyth Allegra, MD 07/26/19 [phone]

## 2019-07-27 ENCOUNTER — Other Ambulatory Visit: Payer: Self-pay

## 2019-07-27 ENCOUNTER — Encounter: Payer: Self-pay | Admitting: Anesthesiology

## 2019-07-27 ENCOUNTER — Encounter: Payer: Self-pay | Admitting: Cardiovascular Disease

## 2019-07-27 ENCOUNTER — Ambulatory Visit (INDEPENDENT_AMBULATORY_CARE_PROVIDER_SITE_OTHER): Payer: Medicare HMO | Admitting: Cardiovascular Disease

## 2019-07-27 ENCOUNTER — Other Ambulatory Visit
Admission: RE | Admit: 2019-07-27 | Discharge: 2019-07-27 | Disposition: A | Discharge status: Home / Self Care | Payer: Medicare HMO | Source: Ambulatory Visit | Attending: Cardiology | Admitting: Cardiology

## 2019-07-27 ENCOUNTER — Telehealth: Payer: Self-pay | Admitting: Cardiovascular Disease

## 2019-07-27 VITALS — BP 80/60 | HR 104 | Ht 65.0 in | Wt 169.5 lb

## 2019-07-27 DIAGNOSIS — I10 Essential (primary) hypertension: Secondary | ICD-10-CM | POA: Diagnosis not present

## 2019-07-27 DIAGNOSIS — E782 Mixed hyperlipidemia: Secondary | ICD-10-CM

## 2019-07-27 DIAGNOSIS — Z01812 Encounter for preprocedural laboratory examination: Secondary | ICD-10-CM | POA: Diagnosis not present

## 2019-07-27 DIAGNOSIS — I4819 Other persistent atrial fibrillation: Secondary | ICD-10-CM | POA: Diagnosis not present

## 2019-07-27 DIAGNOSIS — Z20822 Contact with and (suspected) exposure to covid-19: Secondary | ICD-10-CM | POA: Insufficient documentation

## 2019-07-27 DIAGNOSIS — I471 Supraventricular tachycardia: Secondary | ICD-10-CM

## 2019-07-27 DIAGNOSIS — G4733 Obstructive sleep apnea (adult) (pediatric): Secondary | ICD-10-CM | POA: Diagnosis not present

## 2019-07-27 MED ORDER — FLECAINIDE ACETATE 100 MG PO TABS: 100.0000 mg | ORAL_TABLET | Freq: Two times a day (BID) | ORAL | 3 refills | Status: DC

## 2019-07-27 NOTE — Telephone Encounter (Signed)
Patient c/o Palpitations:  High priority if patient c/o lightheadedness, shortness of breath, or chest pain  1) How long have you had palpitations/irregular HR/ Afib? Are you having the symptoms now? Seen at afib clinic . Yes   2) Are you currently experiencing lightheadedness, SOB or CP? Advised by afib clinic to go to ed for eval of afib got dizzy and fell hitting head   3) Do you have a history of afib (atrial fibrillation) or irregular heart rhythm? yes  4) Have you checked your BP or HR? (document readings if available):  Last night   101/64  114            6 30 am 84/42  111            Now  97/48  113    5) Are you experiencing any other symptoms? Dizziness sent home from ED symptoms not improved this morning

## 2019-07-27 NOTE — Progress Notes (Signed)
Cardiology Office Note  Date:  07/27/2019   ID:  [name], DOB 1945-09-11, MRN [id number]  PCP:  Jinny Sanders, MD   Chief Complaint  Patient presents with  . OTHER    Afib/Hypotension. Meds reviewed verbally with pt.    HPI:  [name] is a 74 y.o. female with history of  persistent A. fib on Eliquis status post cardioversion in 01/2016,05/2016, and 11/2016 status post radiofrequency catheter ablation on 12/17/2016  followed by EP/A. fib clinic, atrial tachycardia s/p DCCV 09/18/2018, pulmonary hypertension,  mitral valve prolapse with mitral regurgitation,  DM2, COVID-19 in 02/2019 hypertension, hyperlipidemia, hypothyroidism, anemia, obesity, OSA on CPAP, diverticulosis, GERD, and anxiety who presents for follow-up of her A. Fib.  Stress , husband at [facility], had medical issues  This past weekend,developed atrial fib, likely on Sunday the May 23 Hypotensive, dizzy, since then Systolic pressures documented in the 80s at home, having orthostasis symptoms She called atrial fibrillation clinic and it was recommended she increase flecainide up to 100 twice daily Has not been able to take diltiazem past 2 days secondary to hypotension Blood pressure again running low today, had to be wheelchair into the office  Denies any chest pain, no lower extremity edema No other recent stressors that could have contributed to her recurrent atrial fibrillation  Recent echocardiogram reviewed with her showing severely dilated left atrium Ejection fraction 55%  EKG personally reviewed by myself on todays visit Atrial fibrillation ventricular rate 104 bpm, unable to exclude atrial tachycardia  Other past medical history reviewed cardiac cath in 12/2008 for atypical chest pain showed no significant CAD with mildly elevated LVEDP and normal EF.  diagnosed with A. fib in 09/2013  Echo at that time showed normal LV systolic function with mild mitral regurgitation, moderately dilated  left atrium, and mild to moderate pulmonary hypertension.    nuclear stress test in 09/2013 was undertaken and was normal. With recurrence of A. fib,   placed on flecainide and underwent cardioversion in 01/2016.   recurrent A. fib in 05/2016 and underwent repeat successful cardioversion at that time.    recurrent A. fib in 11/2016 and underwent a third cardioversion at that time.   radiofrequency catheter ablation on 12/17/2016.     09/15/2018  atrial tachycardia with long cycle Mobitz type I AV block. Her flecainide was increased to 50 mg bid and she underwent successful DCCV on 09/18/2018.  She was diagnosed with COVID-19 in 02/2019, not requiring hospital admission and received infusion.  echo in 04/2019 which showed an EF of 55 to 60%, mildly dilated left ventricular cavity, no regional wall motion normalities, normal RV systolic function and ventricular cavity size, severely dilated left atrium measuring 55 mm, mild mitral regurgitation, mild aortic valve sclerosis without evidence of stenosis.    PMH:   has a past medical history of Anemia, Anxiety, Colon polyps, Constipation, Depressive disorder, not elsewhere classified, Diaphragmatic hernia without mention of obstruction or gangrene, Disorders of bursae and tendons in shoulder region, unspecified, Diverticulosis of colon (without mention of hemorrhage), External hemorrhoid, Fundic gland polyps of stomach, benign, GERD (gastroesophageal reflux disease), History of hiatal hernia, HTN (hypertension), Hypothyroidism, Internal hemorrhoids without mention of complication, Moderate mitral regurgitation, Obesity, unspecified, OSA on CPAP, Other urinary incontinence, Persistent atrial fibrillation ([place]), Pure hypercholesterolemia, Type II diabetes mellitus ([place]), Urinary frequency, Urinary urgency, and Vitamin B 12 deficiency.  PSH:    Past Surgical History:  Procedure Laterality Date  . ABDOMINAL HYSTERECTOMY  1972  Have both ovaries   . ATRIAL FIBRILLATION ABLATION  12/17/2016  . ATRIAL FIBRILLATION ABLATION N/A 12/17/2016   Procedure: ATRIAL FIBRILLATION ABLATION;  Surgeon: Thompson Grayer, MD;  Location: [facility];  Service: Cardiovascular;  Laterality: N/A;  . [place]   [facility]; "Dr. [name]"  . CARDIOVERSION N/A 05/27/2016   Procedure: CARDIOVERSION;  Surgeon: [name], MD;  Location: [facility];  Service: Cardiovascular;  Laterality: N/A;  . CARDIOVERSION N/A 11/25/2016   Procedure: CARDIOVERSION;  Surgeon: [name], MD;  Location: [facility];  Service: Cardiovascular;  Laterality: N/A;  . CARDIOVERSION N/A 09/18/2018   Procedure: CARDIOVERSION;  Surgeon: [name], MD;  Location: [facility];  Service: Cardiovascular;  Laterality: N/A;  . CATARACT EXTRACTION W/ INTRAOCULAR LENS  IMPLANT, BILATERAL Bilateral   . COLONOSCOPY    . ELECTROPHYSIOLOGIC STUDY N/A 01/24/2016   Procedure: CARDIOVERSION;  Surgeon: [name], MD;  Location: [facility];  Service: Cardiovascular;  Laterality: N/A;  . LAPAROSCOPIC CHOLECYSTECTOMY    . REDUCTION MAMMAPLASTY Bilateral   . TONSILLECTOMY    . TOTAL HIP ARTHROPLASTY Right 05/12/2018   Procedure: TOTAL HIP ARTHROPLASTY ANTERIOR APPROACH;  Surgeon: Paralee Cancel, MD;  Location: [facility];  Service: Orthopedics;  Laterality: Right;  70 mins    Current Outpatient Medications  Medication Sig Dispense Refill  . amoxicillin (AMOXIL) 500 MG capsule Take 2,000 mg by mouth once.     [name] atorvastatin (LIPITOR) 10 MG tablet TAKE 1 TABLET BY MOUTH EVERYDAY AT BEDTIME (Patient taking differently: Take 10 mg by mouth at bedtime. ) 90 tablet 3  . Calcium Carb-Ergocalciferol 500-200 MG-UNIT TABS Take 1 tablet by mouth daily.     [name] diltiazem (CARDIZEM CD) 120 MG 24 hr capsule TAKE 1 CAPSULE BY MOUTH EVERY DAY (Patient taking differently: Take 120 mg by mouth daily. ) 90 capsule 3  . ELIQUIS 5 MG TABS tablet TAKE 1 TABLET BY MOUTH TWICE A DAY (Patient taking  differently: Take 5 mg by mouth 2 (two) times daily. ) 60 tablet 9  . escitalopram (LEXAPRO) 10 MG tablet Take 1 tablet by mouth once daily (Patient taking differently: Take 10 mg by mouth at bedtime. ) 90 tablet 0  . glucose blood test strip CHECK BLOOD SUGAR 2 TIMES A DAY AS DIRECTED DX CODE E11.9 100 each 7  . linagliptin (TRADJENTA) 5 MG TABS tablet Take 1 tablet (5 mg total) by mouth daily. (Patient taking differently: Take 5 mg by mouth at bedtime. ) 30 tablet 11  . metFORMIN (GLUCOPHAGE-XR) 500 MG 24 hr tablet TAKE 2 TABLETS (1,000 MG TOTAL) BY MOUTH 2 (TWO) TIMES DAILY WITH A MEAL. 360 tablet 1  . Multiple Vitamin (MULTIVITAMIN) tablet Take 1 tablet by mouth daily.      [name] acetaminophen (TYLENOL) 500 MG tablet Take 1,000 mg by mouth every 6 (six) hours as needed for mild pain or headache.    . flecainide (TAMBOCOR) 100 MG tablet Take 1 tablet (100 mg total) by mouth 2 (two) times daily. 180 tablet 3  . fluticasone (FLONASE) 50 MCG/ACT nasal spray Place 2 sprays into both nostrils daily as needed for allergies.     No current facility-administered medications for this visit.     Allergies:   Pravachol [pravastatin sodium] and Contrast media [iodinated diagnostic agents]   Social History:  The patient  reports that she has never smoked. She has never used smokeless tobacco. She reports that she does not drink alcohol or use  drugs.   Family History:   family history includes Cancer in her mother; Dementia in her mother; Diabetes in her brother and mother; Hyperlipidemia in her father and mother; Hypertension in her sister; Lung cancer in her father; Pulmonary embolism in her mother; Stroke in her paternal grandfather and paternal grandmother.    Review of Systems: Review of Systems  Constitutional: Negative.   HENT: Negative.   Respiratory: Negative.   Cardiovascular: Positive for palpitations.       Tachycardic  Gastrointestinal: Negative.   Musculoskeletal: Negative.    Neurological: Positive for dizziness.  Psychiatric/Behavioral: Negative.   All other systems reviewed and are negative.   PHYSICAL EXAM: VS:  BP (!) 80/60 (BP Location: Left Arm, Patient Position: Sitting, Cuff Size: Normal)   Pulse (!) 104   Ht 5\' 5"  (1.651 m)   Wt 169 lb 8 oz (76.9 kg)   LMP  (LMP Unknown)   SpO2 98%   BMI 28.21 kg/m  , BMI Body mass index is 28.21 kg/m. Constitutional:  oriented to person, place, and time. No distress.  HENT:  Head: Grossly normal Eyes:  no discharge. No scleral icterus.  Neck: No JVD, no carotid bruits  Cardiovascular: Regular rate and rhythm, no murmurs appreciated Pulmonary/Chest: Clear to auscultation bilaterally, no wheezes or rails Abdominal: Soft.  no distension.  no tenderness.  Musculoskeletal: Normal range of motion Neurological:  normal muscle tone. Coordination normal. No atrophy Skin: Skin warm and dry Psychiatric: normal affect, pleasant  Recent Labs: 01/01/2019: ALT 14 06/08/2019: Hemoglobin 11.2; Magnesium 1.9; Platelets 274 06/10/2019: BUN 20; Creatinine, Ser 0.92; Potassium 4.8; Sodium 135    Lipid Panel Lab Results  Component Value Date   CHOL 153 08/17/2018   HDL 43.80 08/17/2018   LDLCALC 91 08/17/2018   TRIG 92.0 08/17/2018      Wt Readings from Last 3 Encounters:  07/27/19 169 lb 8 oz (76.9 kg)  07/21/19 169 lb (76.7 kg)  06/10/19 169 lb (76.7 kg)       ASSESSMENT AND PLAN:  Problem List Items Addressed This Visit      Cardiology Problems   Atrial tachycardia (HCC)   Relevant Medications   flecainide (TAMBOCOR) 100 MG tablet   Essential hypertension   Relevant Medications   flecainide (TAMBOCOR) 100 MG tablet   Persistent atrial fibrillation (HCC) - Primary   Relevant Medications   flecainide (TAMBOCOR) 100 MG tablet   Other Relevant Orders   EKG 12-Lead    Other Visit Diagnoses    Mixed hyperlipidemia       Relevant Medications   flecainide (TAMBOCOR) 100 MG tablet   Obstructive sleep  apnea         Tachyarrhythmia today with associated hypotension Unable to exclude atrial fibrillation versus atrial tachycardia She is actually had both arrhythmias, more recently atrial tachycardia requiring cardioversion Recommend she continue flecainide 100 twice daily, unable to restart carvedilol given hypotension -Would likely have improved blood pressure after cardioversion -We will defer to the arrhythmia clinic, if she continues to have episodes may need alternate medications, consider beta-blocker with her flecainide  Hypotension Secondary to arrhythmia as detailed above Plan for cardioversion tomorrow morning  Details of cardioversion discussed with her, risk and benefit of the procedure, she has been compliant with her anticoagulation medication  Disposition:   F/U with Dr. Fletcher Anon in 1 month   Total encounter time more than 45 minutes  Greater than 50% was spent in counseling and coordination of care with the patient  Signed, Esmond Plants, M.D., Ph.D. [place], [place]

## 2019-07-27 NOTE — Telephone Encounter (Signed)
DOD appt at 0920 with Dr. Rockey Situ. Daughter expresses since appt is soon she will forgo speaking with RN at this time to make appt on time.

## 2019-07-27 NOTE — Patient Instructions (Addendum)
Medication Instructions:  Your physician has recommended you make the following change in your medication:  1. INCREASE Flecainide 100 mg twice a day 2. HOLD Diltiazem (Cardizem) until blood pressure is at least 110 top number.   If you need a refill on your cardiac medications before your next appointment, please call your pharmacy.    Lab work: No new labs needed   If you have labs (blood work) drawn today and your tests are completely normal, you will receive your results only by: [name] MyChart Message (if you have MyChart) OR . A paper copy in the mail If you have any lab test that is abnormal or we need to change your treatment, we will call you to review the results.   Testing/Procedures: You are scheduled for a Cardioversion on Wednesday May 26th with Dr. Garen Lah.  Please arrive at the [facility] at 06:30 a.m. on the day of your procedure.  DIET INSTRUCTIONS:  Nothing to eat or drink after midnight except your medications with a small sip of water.         1) Labs: COVID swab to be done today. When you leave go to the drive thru testing site at the [facility] building for your testing and then go home and stay until procedure tomorrow.   2) Medications:  YOU MAY TAKE ALL of your remaining medications with a small amount of water.  3) Must have a responsible person to drive you home.  4) Bring a current list of your medications and current insurance cards.    If you have any questions after you get home, please call the office at [phone]    Follow-Up: At [facility], you and your health needs are our priority.  As part of our continuing mission to provide you with exceptional heart care, we have created designated Provider Care Teams.  These Care Teams include your primary Cardiologist (physician) and Advanced Practice Providers (APPs -  Physician Assistants and Nurse Practitioners) who all work together to provide you with the care you need, when  you need it.  You will need a follow up appointment with Arida in one month   . Providers on your designated Care Team:   . Murray Hodgkins, NP . Christell Faith, PA-C . Marrianne Mood, PA-C  Any Other Special Instructions Will Be Listed Below (If Applicable).  For educational health videos Log in to : [email or web address] Or : [email or web address], password : [email or web address]

## 2019-07-28 ENCOUNTER — Other Ambulatory Visit: Payer: Self-pay

## 2019-07-28 ENCOUNTER — Encounter: Payer: Self-pay | Admitting: Cardiology

## 2019-07-28 ENCOUNTER — Encounter
Admission: RE | Disposition: A | Discharge status: Home / Self Care | Payer: Medicare HMO | Source: Home / Self Care | Attending: Cardiology

## 2019-07-28 ENCOUNTER — Ambulatory Visit
Admission: RE | Admit: 2019-07-28 | Discharge: 2019-07-28 | Disposition: A | Discharge status: Home / Self Care | Payer: Medicare HMO | Attending: Cardiology | Admitting: Cardiology

## 2019-07-28 ENCOUNTER — Other Ambulatory Visit: Payer: Self-pay | Admitting: Cardiovascular Disease

## 2019-07-28 DIAGNOSIS — Z7901 Long term (current) use of anticoagulants: Secondary | ICD-10-CM | POA: Diagnosis not present

## 2019-07-28 DIAGNOSIS — I440 Atrioventricular block, first degree: Secondary | ICD-10-CM | POA: Diagnosis not present

## 2019-07-28 DIAGNOSIS — I4891 Unspecified atrial fibrillation: Secondary | ICD-10-CM | POA: Insufficient documentation

## 2019-07-28 HISTORY — PX: CARDIOVERSION: SHX1299

## 2019-07-28 LAB — SARS CORONAVIRUS 2 (TAT 6-24 HRS): SARS Coronavirus 2: NEGATIVE

## 2019-07-28 SURGERY — CARDIOVERSION
Anesthesia: General

## 2019-07-28 NOTE — H&P (Signed)
INTERVAL HISTORY: The patient reports today for DC cardioversion for atrial fibrillation. Prior office notes reviewed and no new changes since.  Patient started taking flecainide at increased dose of 100 mg twice daily 2 days ago.  Today's Blood pressure 120/58  EKG personally reviewed by myself on todays visit Shows normal sinus rhythm, first-degree AV block, 62 bpm.   Patient currently in sinus rhythm hence cardioversion not performed. Patient counseled to continue flecainide at current dose.  Continue Eliquis.  Keep appointments for follow-up in the office and also A. fib clinic  Signed, Kate Sable, M.D. 07/28/19 [place], [place]

## 2019-07-28 NOTE — Progress Notes (Signed)
Dr. Garen Lah at bedside, speaking with pt.. Pt. In NSR at present. Cardioversion cancelled for today. Pt. To follow-up in office.

## 2019-07-29 LAB — URINE CULTURE: Culture: >=100000 — AB

## 2019-07-30 ENCOUNTER — Telehealth: Payer: Self-pay | Admitting: Emergency Medicine

## 2019-07-30 NOTE — Telephone Encounter (Signed)
Post ED Visit - Positive Culture Follow-up  Culture report reviewed by antimicrobial stewardship pharmacist: [place] Team []  Elenor Quinones, Pharm.D. []  Heide Guile, Pharm.D., BCPS AQ-ID []  Parks Neptune, Pharm.D., BCPS []  Alycia Rossetti, Pharm.D., BCPS []  [place]and[place], Pharm.D., BCPS, AAHIVP []  Legrand Como, Pharm.D., BCPS, AAHIVP []  Salome Arnt, PharmD, BCPS []  Johnnette Gourd, PharmD, BCPS []  Hughes Better, PharmD, BCPS [x]  Acey Lav, PharmD []  Laqueta Linden, PharmD, BCPS []  Albertina Parr, PharmD  [place] Team []  Leodis Sias, PharmD []  Lindell Spar, PharmD []  Royetta Asal, PharmD []  Graylin Shiver, Rph []  Rema Fendt) Glennon Mac, PharmD []  Arlyn Dunning, PharmD []  Netta Cedars, PharmD []  Dia Sitter, PharmD []  Leone Haven, PharmD []  Gretta Arab, PharmD []  Theodis Shove, PharmD []  Peggyann Juba, PharmD []  Reuel Boom, PharmD   Positive urine culture Treated with Cephalexin, organism sensitive to the same and no further patient follow-up is required at this time.  Sandi Raveling Shawnna Pancake 07/30/2019, 11:18 AM

## 2019-08-05 ENCOUNTER — Ambulatory Visit (INDEPENDENT_AMBULATORY_CARE_PROVIDER_SITE_OTHER): Payer: Medicare HMO | Admitting: Family Medicine

## 2019-08-05 ENCOUNTER — Encounter: Payer: Self-pay | Admitting: Family Medicine

## 2019-08-05 ENCOUNTER — Other Ambulatory Visit: Payer: Self-pay

## 2019-08-05 VITALS — BP 118/62 | HR 59 | Ht 65.0 in | Wt 170.0 lb

## 2019-08-05 DIAGNOSIS — S50861A Insect bite (nonvenomous) of right forearm, initial encounter: Secondary | ICD-10-CM

## 2019-08-05 DIAGNOSIS — W57XXXA Bitten or stung by nonvenomous insect and other nonvenomous arthropods, initial encounter: Secondary | ICD-10-CM | POA: Diagnosis not present

## 2019-08-05 MED ORDER — TRIAMCINOLONE ACETONIDE 0.1 % EX CREA: 1.0000 "application " | TOPICAL_CREAM | Freq: Two times a day (BID) | CUTANEOUS | 0 refills | Status: DC

## 2019-08-05 NOTE — Patient Instructions (Signed)
Use the steroid cream to any area that has a rash or itchy  If no improvement or worsening rash call or return

## 2019-08-05 NOTE — Progress Notes (Signed)
   Subjective:     [name] is a 74 y.o. female presenting for Insect Bite     HPI   #Insect Bite - 2 days ago - on the right forearm  - noticed a spot on the left forearm today - cleaned with alcohol - itchy - not painful - does remember filling something on her arm and rubbing off  -    Review of Systems   Social History   Tobacco Use  Smoking Status Never Smoker  Smokeless Tobacco Never Used        Objective:    BP Readings from Last 3 Encounters:  08/05/19 118/62  07/28/19 (!) 122/56  07/27/19 (!) 80/60   Wt Readings from Last 3 Encounters:  08/05/19 170 lb (77.1 kg)  07/28/19 169 lb (76.7 kg)  07/27/19 169 lb 8 oz (76.9 kg)    BP 118/62   Pulse (!) 59   Ht 5\' 5"  (1.651 m)   Wt 170 lb (77.1 kg)   LMP  (LMP Unknown)   SpO2 97%   BMI 28.29 kg/m    Physical Exam Constitutional:      General: She is not in acute distress.    Appearance: She is well-developed. She is not diaphoretic.  HENT:     Right Ear: External ear normal.     Left Ear: External ear normal.  Eyes:     Conjunctiva/sclera: Conjunctivae normal.  Cardiovascular:     Rate and Rhythm: Normal rate.  Pulmonary:     Effort: Pulmonary effort is normal.  Musculoskeletal:     Cervical back: Neck supple.  Skin:    General: Skin is warm and dry.     Capillary Refill: Capillary refill takes less than 2 seconds.     Comments: Right forearm: local area of inflammation with raised erythematous lesion surrounded by flesh colored vesicles. Left forearm with small around of faint erythema.   Neurological:     Mental Status: She is alert. Mental status is at baseline.  Psychiatric:        Mood and Affect: Mood normal.        Behavior: Behavior normal.           Assessment & Plan:   Problem List Items Addressed This Visit    None    Visit Diagnoses    Insect bite of right forearm, initial encounter    -  Primary   Relevant Medications   triamcinolone cream (KENALOG) 0.1  %     Etiology either contact dermatitis, localized reaction in insect bite or other allergic reaction.   Discussed treatment of steroids to help with inflammation - if worsening call and could consider systemic treatment. Allergy to cortisone but does well with topical steroids   Return if symptoms worsen or fail to improve.  Lesleigh Noe, MD

## 2019-08-12 ENCOUNTER — Other Ambulatory Visit: Payer: Self-pay | Admitting: Internal Medicine

## 2019-08-12 DIAGNOSIS — R69 Illness, unspecified: Secondary | ICD-10-CM | POA: Diagnosis not present

## 2019-08-13 NOTE — Telephone Encounter (Signed)
lvm asking pt to call office °

## 2019-08-16 DIAGNOSIS — H524 Presbyopia: Secondary | ICD-10-CM | POA: Diagnosis not present

## 2019-08-16 DIAGNOSIS — E119 Type 2 diabetes mellitus without complications: Secondary | ICD-10-CM | POA: Diagnosis not present

## 2019-08-20 ENCOUNTER — Telehealth: Payer: Self-pay | Admitting: Family Medicine

## 2019-08-20 DIAGNOSIS — E7800 Pure hypercholesterolemia, unspecified: Secondary | ICD-10-CM

## 2019-08-20 DIAGNOSIS — D508 Other iron deficiency anemias: Secondary | ICD-10-CM

## 2019-08-20 DIAGNOSIS — E538 Deficiency of other specified B group vitamins: Secondary | ICD-10-CM

## 2019-08-20 DIAGNOSIS — E559 Vitamin D deficiency, unspecified: Secondary | ICD-10-CM

## 2019-08-20 NOTE — Telephone Encounter (Signed)
-----   Message from Cloyd Stagers, RT sent at 08/13/2019  1:25 PM EDT ----- Regarding: Lab Orders for Friday 6.25.2021 Please place lab orders for Friday 6.25.2021, office visit for physical on Friday 7.2.2021 Thank you, Dyke Maes RT(R)

## 2019-08-21 ENCOUNTER — Other Ambulatory Visit: Payer: Self-pay | Admitting: Family Medicine

## 2019-08-27 ENCOUNTER — Other Ambulatory Visit (INDEPENDENT_AMBULATORY_CARE_PROVIDER_SITE_OTHER): Payer: Medicare HMO

## 2019-08-27 ENCOUNTER — Ambulatory Visit (INDEPENDENT_AMBULATORY_CARE_PROVIDER_SITE_OTHER): Payer: Medicare HMO

## 2019-08-27 VITALS — BP 125/49 | Wt 169.0 lb

## 2019-08-27 DIAGNOSIS — E7800 Pure hypercholesterolemia, unspecified: Secondary | ICD-10-CM | POA: Diagnosis not present

## 2019-08-27 DIAGNOSIS — E538 Deficiency of other specified B group vitamins: Secondary | ICD-10-CM | POA: Diagnosis not present

## 2019-08-27 DIAGNOSIS — E559 Vitamin D deficiency, unspecified: Secondary | ICD-10-CM | POA: Diagnosis not present

## 2019-08-27 DIAGNOSIS — Z0000 Encounter for general adult medical examination without abnormal findings: Secondary | ICD-10-CM

## 2019-08-27 LAB — COMPREHENSIVE METABOLIC PANEL
ALT: 10 U/L (ref 0–35)
AST: 12 U/L (ref 0–37)
Albumin: 4.1 g/dL (ref 3.5–5.2)
Alkaline Phosphatase: 53 U/L (ref 39–117)
BUN: 16 mg/dL (ref 6–23)
CO2: 29 mEq/L (ref 19–32)
Calcium: 9.8 mg/dL (ref 8.4–10.5)
Chloride: 100 mEq/L (ref 96–112)
Creatinine, Ser: 0.99 mg/dL (ref 0.40–1.20)
GFR: 55.07 mL/min — ABNORMAL LOW (ref >60.00)
Glucose, Bld: 133 mg/dL — ABNORMAL HIGH (ref 70–99)
Potassium: 4.6 mEq/L (ref 3.5–5.1)
Sodium: 137 mEq/L (ref 135–145)
Total Bilirubin: 0.3 mg/dL (ref 0.2–1.2)
Total Protein: 6.6 g/dL (ref 6.0–8.3)

## 2019-08-27 LAB — VITAMIN B12: Vitamin B-12: 292 pg/mL (ref 211–911)

## 2019-08-27 LAB — LIPID PANEL
Cholesterol: 145 mg/dL (ref 0–200)
HDL: 49.1 mg/dL (ref >39.00)
LDL Cholesterol: 85 mg/dL (ref 0–99)
NonHDL: 95.82
Total CHOL/HDL Ratio: 3
Triglycerides: 52 mg/dL (ref 0.0–149.0)
VLDL: 10.4 mg/dL (ref 0.0–40.0)

## 2019-08-27 LAB — VITAMIN D 25 HYDROXY (VIT D DEFICIENCY, FRACTURES): VITD: 56.09 ng/mL (ref 30.00–100.00)

## 2019-08-27 NOTE — Patient Instructions (Signed)
[name] , Thank you for taking time to come for your Medicare Wellness Visit. I appreciate your ongoing commitment to your health goals. Please review the following plan we discussed and let me know if I can assist you in the future.   Screening recommendations/referrals: Colonoscopy: Up to date, completed 09/02/2017, due 09/2027 Mammogram: Up to date, completed 07/22/2019, due 07/2020 Bone Density: due, will discuss with provider Recommended yearly ophthalmology/optometry visit for glaucoma screening and checkup Recommended yearly dental visit for hygiene and checkup  Vaccinations: Influenza vaccine: Up to date, completed 11/17/2018, due 10/2019 Pneumococcal vaccine: Completed series Tdap vaccine: Up to date, completed 04/10/2010, due 04/2020 Shingles vaccine: due, check insurance coverage    Covid-19:due, information given on how to obtain this  Advanced directives: Please bring a copy of your POA (Power of [place]) and/or Living Will to your next appointment.   Conditions/risks identified: diabetes, hypertension, hypercholesterolemia  Next appointment: Follow up in one year for your annual wellness visit    Preventive Care 65 Years and Older, Female Preventive care refers to lifestyle choices and visits with your health care provider that can promote health and wellness. What does preventive care include?  A yearly physical exam. This is also called an annual well check.  Dental exams once or twice a year.  Routine eye exams. Ask your health care provider how often you should have your eyes checked.  Personal lifestyle choices, including:  Daily care of your teeth and gums.  Regular physical activity.  Eating a healthy diet.  Avoiding tobacco and drug use.  Limiting alcohol use.  Practicing safe sex.  Taking low-dose aspirin every day.  Taking vitamin and mineral supplements as recommended by your health care provider. What happens during an annual well check? The  services and screenings done by your health care provider during your annual well check will depend on your age, overall health, lifestyle risk factors, and family history of disease. Counseling  Your health care provider may ask you questions about your:  Alcohol use.  Tobacco use.  Drug use.  Emotional well-being.  Home and relationship well-being.  Sexual activity.  Eating habits.  History of falls.  Memory and ability to understand (cognition).  Work and work Statistician.  Reproductive health. Screening  You may have the following tests or measurements:  Height, weight, and BMI.  Blood pressure.  Lipid and cholesterol levels. These may be checked every 5 years, or more frequently if you are over 44 years old.  Skin check.  Lung cancer screening. You may have this screening every year starting at age 67 if you have a 30-pack-year history of smoking and currently smoke or have quit within the past 15 years.  Fecal occult blood test (FOBT) of the stool. You may have this test every year starting at age 83.  Flexible sigmoidoscopy or colonoscopy. You may have a sigmoidoscopy every 5 years or a colonoscopy every 10 years starting at age 97.  Hepatitis C blood test.  Hepatitis B blood test.  Sexually transmitted disease (STD) testing.  Diabetes screening. This is done by checking your blood sugar (glucose) after you have not eaten for a while (fasting). You may have this done every 1-3 years.  Bone density scan. This is done to screen for osteoporosis. You may have this done starting at age 30.  Mammogram. This may be done every 1-2 years. Talk to your health care provider about how often you should have regular mammograms. Talk with your health care  provider about your test results, treatment options, and if necessary, the need for more tests. Vaccines  Your health care provider may recommend certain vaccines, such as:  Influenza vaccine. This is recommended  every year.  Tetanus, diphtheria, and acellular pertussis (Tdap, Td) vaccine. You may need a Td booster every 10 years.  Zoster vaccine. You may need this after age 49.  Pneumococcal 13-valent conjugate (PCV13) vaccine. One dose is recommended after age 61.  Pneumococcal polysaccharide (PPSV23) vaccine. One dose is recommended after age 68. Talk to your health care provider about which screenings and vaccines you need and how often you need them. This information is not intended to replace advice given to you by your health care provider. Make sure you discuss any questions you have with your health care provider. Document Released: 03/17/2015 Document Revised: 11/08/2015 Document Reviewed: 12/20/2014 Elsevier Interactive Patient Education  2017 [employer] Prevention in the Home Falls can cause injuries. They can happen to people of all ages. There are many things you can do to make your home safe and to help prevent falls. What can I do on the outside of my home?  Regularly fix the edges of walkways and driveways and fix any cracks.  Remove anything that might make you trip as you walk through a door, such as a raised step or threshold.  Trim any bushes or trees on the path to your home.  Use bright outdoor lighting.  Clear any walking paths of anything that might make someone trip, such as rocks or tools.  Regularly check to see if handrails are loose or broken. Make sure that both sides of any steps have handrails.  Any raised decks and porches should have guardrails on the edges.  Have any leaves, snow, or ice cleared regularly.  Use sand or salt on walking paths during winter.  Clean up any spills in your garage right away. This includes oil or grease spills. What can I do in the bathroom?  Use night lights.  Install grab bars by the toilet and in the tub and shower. Do not use towel bars as grab bars.  Use non-skid mats or decals in the tub or shower.  If  you need to sit down in the shower, use a plastic, non-slip stool.  Keep the floor dry. Clean up any water that spills on the floor as soon as it happens.  Remove soap buildup in the tub or shower regularly.  Attach bath mats securely with double-sided non-slip rug tape.  Do not have throw rugs and other things on the floor that can make you trip. What can I do in the bedroom?  Use night lights.  Make sure that you have a light by your bed that is easy to reach.  Do not use any sheets or blankets that are too big for your bed. They should not hang down onto the floor.  Have a firm chair that has side arms. You can use this for support while you get dressed.  Do not have throw rugs and other things on the floor that can make you trip. What can I do in the kitchen?  Clean up any spills right away.  Avoid walking on wet floors.  Keep items that you use a lot in easy-to-reach places.  If you need to reach something above you, use a strong step stool that has a grab bar.  Keep electrical cords out of the way.  Do not use floor polish  or wax that makes floors slippery. If you must use wax, use non-skid floor wax.  Do not have throw rugs and other things on the floor that can make you trip. What can I do with my stairs?  Do not leave any items on the stairs.  Make sure that there are handrails on both sides of the stairs and use them. Fix handrails that are broken or loose. Make sure that handrails are as long as the stairways.  Check any carpeting to make sure that it is firmly attached to the stairs. Fix any carpet that is loose or worn.  Avoid having throw rugs at the top or bottom of the stairs. If you do have throw rugs, attach them to the floor with carpet tape.  Make sure that you have a light switch at the top of the stairs and the bottom of the stairs. If you do not have them, ask someone to add them for you. What else can I do to help prevent falls?  Wear shoes  that:  Do not have high heels.  Have rubber bottoms.  Are comfortable and fit you well.  Are closed at the toe. Do not wear sandals.  If you use a stepladder:  Make sure that it is fully opened. Do not climb a closed stepladder.  Make sure that both sides of the stepladder are locked into place.  Ask someone to hold it for you, if possible.  Clearly mark and make sure that you can see:  Any grab bars or handrails.  First and last steps.  Where the edge of each step is.  Use tools that help you move around (mobility aids) if they are needed. These include:  Canes.  Walkers.  Scooters.  Crutches.  Turn on the lights when you go into a dark area. Replace any light bulbs as soon as they burn out.  Set up your furniture so you have a clear path. Avoid moving your furniture around.  If any of your floors are uneven, fix them.  If there are any pets around you, be aware of where they are.  Review your medicines with your doctor. Some medicines can make you feel dizzy. This can increase your chance of falling. Ask your doctor what other things that you can do to help prevent falls. This information is not intended to replace advice given to you by your health care provider. Make sure you discuss any questions you have with your health care provider. Document Released: 12/15/2008 Document Revised: 07/27/2015 Document Reviewed: 03/25/2014 Elsevier Interactive Patient Education  2017 [employer].

## 2019-08-27 NOTE — Progress Notes (Signed)
PCP notes:  Health Maintenance: Dexa- due Foot exam- due COVID- due   Abnormal Screenings: none   Patient concerns: none   Nurse concerns: none   Next PCP appt.: 09/03/2019 @ 2:40 pm

## 2019-08-27 NOTE — Progress Notes (Signed)
Subjective:   [name] is a 74 y.o. female who presents for Medicare Annual (Subsequent) preventive examination.  Review of Systems: N/A      I connected with the patient today by telephone and verified that I am speaking with the correct person using two identifiers. Location patient: home Location nurse: work Persons participating in the virtual visit: patient, Marine scientist.   I discussed the limitations, risks, security and privacy concerns of performing an evaluation and management service by telephone and the availability of in person appointments. I also discussed with the patient that there may be a patient responsible charge related to this service. The patient expressed understanding and verbally consented to this telephonic visit.    Interactive audio and video telecommunications were attempted between this nurse and patient, however failed, due to patient having technical difficulties OR patient did not have access to video capability.  We continued and completed visit with audio only.     Cardiac Risk Factors include: advanced age (>23men, >73 women);diabetes mellitus;hypertension;Other (see comment), Risk factor comments: hypercholesterolemia     Objective:    Today's Vitals   08/27/19 1116  BP: (!) 125/49  Weight: 169 lb (76.7 kg)   Body mass index is 28.12 kg/m.  Advanced Directives 08/27/2019 07/26/2019 06/10/2019 01/01/2019 09/18/2018 08/17/2018 05/12/2018  Does Patient Have a Medical Advance Directive? Yes No No No Yes Yes Yes  Type of Paramedic of [place];Living will - - - [place];Living will [place];Living will [place];Living will  Does patient want to make changes to medical advance directive? - - - - - - No - Patient declined  Copy of [place] in Chart? No - copy requested - - - No - copy requested No - copy requested Yes - validated most recent copy scanned  in chart (See row information)  Would patient like information on creating a medical advance directive? - No - Patient declined - - - - -    Current Medications (verified) Outpatient Encounter Medications as of 08/27/2019  Medication Sig  . acetaminophen (TYLENOL) 500 MG tablet Take 1,000 mg by mouth every 6 (six) hours as needed for mild pain or headache.  [name] acetaminophen (TYLENOL) 500 MG tablet Take 1,000 mg by mouth daily as needed for mild pain or headache.  [name] amoxicillin (AMOXIL) 500 MG capsule Take 2,000 mg by mouth once.   [name] atorvastatin (LIPITOR) 10 MG tablet TAKE 1 TABLET BY MOUTH EVERYDAY AT BEDTIME  . Calcium Carb-Ergocalciferol 500-200 MG-UNIT TABS Take 1 tablet by mouth daily.   [name] diltiazem (CARDIZEM CD) 120 MG 24 hr capsule Take 1 capsule by mouth daily.  [name] ELIQUIS 5 MG TABS tablet Take 5 mg by mouth 2 (two) times daily.  [name] escitalopram (LEXAPRO) 10 MG tablet Take 10 mg by mouth daily.  . flecainide (TAMBOCOR) 100 MG tablet Take 1 tablet (100 mg total) by mouth 2 (two) times daily.  . metFORMIN (GLUCOPHAGE-XR) 500 MG 24 hr tablet TAKE 2 TABLETS (1,000 MG TOTAL) BY MOUTH 2 (TWO) TIMES DAILY WITH A MEAL.  . Multiple Vitamin (MULTIVITAMIN) tablet Take 1 tablet by mouth daily.    Glory Rosebush ULTRA test strip CHECK BLOOD SUGAR 2 TIMES A DAY AS DIRECTED DX CODE E11.9  . solifenacin (VESICARE) 10 MG tablet Take 10 mg by mouth daily.  . TRADJENTA 5 MG TABS tablet Take 5 mg by mouth daily.  [name] triamcinolone cream (KENALOG) 0.1 %  Apply 1 application topically 2 (two) times daily.   No facility-administered encounter medications on file as of 08/27/2019.    Allergies (verified) Cortisone, Pravachol [pravastatin sodium], and Contrast media [iodinated diagnostic agents]   History: Past Medical History:  Diagnosis Date  . Anemia   . Anxiety   . Atrial fibrillation ([place])   . Colon polyps   . Constipation   . Depressive disorder, not elsewhere classified   . Diabetes mellitus without  complication ([place])   . Diaphragmatic hernia without mention of obstruction or gangrene   . Disorders of bursae and tendons in shoulder region, unspecified   . Diverticulosis of colon (without mention of hemorrhage)   . External hemorrhoid   . Fundic gland polyps of stomach, benign   . GERD (gastroesophageal reflux disease)   . History of hiatal hernia   . HTN (hypertension)   . Hypertension   . Hypothyroidism   . Internal hemorrhoids without mention of complication   . Moderate mitral regurgitation   . Obesity, unspecified   . OSA on CPAP    compliant with CPAP  . Other urinary incontinence   . Persistent atrial fibrillation ([place])   . Pure hypercholesterolemia   . Type II diabetes mellitus ([place])   . Urinary frequency   . Urinary urgency   . Vitamin B 12 deficiency    Past Surgical History:  Procedure Laterality Date  . ABDOMINAL HYSTERECTOMY    . ABDOMINAL HYSTERECTOMY  1972   Have both ovaries  . ATRIAL FIBRILLATION ABLATION  12/17/2016  . ATRIAL FIBRILLATION ABLATION N/A 12/17/2016   Procedure: ATRIAL FIBRILLATION ABLATION;  Surgeon: Thompson Grayer, MD;  Location: [facility];  Service: Cardiovascular;  Laterality: N/A;  . [place]   [facility]; "Dr. [name]"  . CARDIOVERSION N/A 05/27/2016   Procedure: CARDIOVERSION;  Surgeon: [name], MD;  Location: [facility];  Service: Cardiovascular;  Laterality: N/A;  . CARDIOVERSION N/A 11/25/2016   Procedure: CARDIOVERSION;  Surgeon: [name], MD;  Location: [facility];  Service: Cardiovascular;  Laterality: N/A;  . CARDIOVERSION N/A 09/18/2018   Procedure: CARDIOVERSION;  Surgeon: [name], MD;  Location: [facility];  Service: Cardiovascular;  Laterality: N/A;  . CARDIOVERSION N/A 07/28/2019   Procedure: CARDIOVERSION;  Surgeon: Kate Sable, MD;  Location: [facility];  Service: Cardiovascular;  Laterality: N/A;  . CATARACT EXTRACTION W/ INTRAOCULAR LENS  IMPLANT, BILATERAL Bilateral   .  COLONOSCOPY    . ELECTROPHYSIOLOGIC STUDY N/A 01/24/2016   Procedure: CARDIOVERSION;  Surgeon: [name], MD;  Location: [facility];  Service: Cardiovascular;  Laterality: N/A;  . LAPAROSCOPIC CHOLECYSTECTOMY    . REDUCTION MAMMAPLASTY Bilateral   . TONSILLECTOMY    . TOTAL HIP ARTHROPLASTY Right 05/12/2018   Procedure: TOTAL HIP ARTHROPLASTY ANTERIOR APPROACH;  Surgeon: Paralee Cancel, MD;  Location: [facility];  Service: Orthopedics;  Laterality: Right;  70 mins   Family History  Problem Relation Age of Onset  . Cancer Mother        fallopian tube  . Dementia Mother   . Pulmonary embolism Mother   . Hyperlipidemia Mother   . Diabetes Mother   . Hyperlipidemia Father   . Lung cancer Father   . Hypertension Sister   . Diabetes Brother   . Stroke Paternal Grandmother   . Stroke Paternal Grandfather   . Colon cancer Neg Hx   . Esophageal cancer Neg Hx    Social History   Socioeconomic History  . Marital  status: Married    Spouse name: Laverna Peace  . Number of children: 2  . Years of education: Not on file  . Highest education level: Not on file  Occupational History  . Occupation: retired    Fish farm manager: UNEMPLOYED  Tobacco Use  . Smoking status: Never Smoker  . Smokeless tobacco: Never Used  Vaping Use  . Vaping Use: Never used  Substance and Sexual Activity  . Alcohol use: No    Alcohol/week: 0.0 standard drinks  . Drug use: No  . Sexual activity: Yes  Other Topics Concern  . Not on file  Social History Narrative   ** Merged History Encounter **       Exercise walks 3 times daily Lives with spouse in [place] - retired from employment 1 son and 1 daughter  Diet fruit, occ fast food, salads and lean meat  3 tea/day, no EtOH, no tobacco, no drugs    Social Determinants of Health   Financial Resource Strain: Low Risk   . Difficulty of Paying Living Expenses: Not hard at all  Food Insecurity: No Food Insecurity  . Worried About Charity fundraiser in  the Last Year: Never true  . Ran Out of Food in the Last Year: Never true  Transportation Needs: No Transportation Needs  . Lack of Transportation (Medical): No  . Lack of Transportation (Non-Medical): No  Physical Activity: Inactive  . Days of Exercise per Week: 0 days  . Minutes of Exercise per Session: 0 min  Stress: Stress Concern Present  . Feeling of Stress : To some extent  Social Connections: Unknown  . Frequency of Communication with Friends and Family: More than three times a week  . Frequency of Social Gatherings with Friends and Family: Not on file  . Attends Religious Services: Not on file  . Active Member of Clubs or Organizations: Not on file  . Attends Archivist Meetings: Not on file  . Marital Status: Not on file    Tobacco Counseling Counseling given: Not Answered   Clinical Intake:  Pre-visit preparation completed: Yes  Pain : No/denies pain     Nutritional Status: BMI 25 -29 Overweight Nutritional Risks: None Diabetes: Yes CBG done?: No Did pt. bring in CBG monitor from home?: No How often do you need to have someone help you when you read instructions, pamphlets, or other written materials from your doctor or pharmacy?: 1 - Never What is the last grade level you completed in school?: 12th  Diabetic: Yes Nutrition Risk Assessment:  Has the patient had any N/V/D within the last 2 months?  No  Does the patient have any non-healing wounds?  No  Has the patient had any unintentional weight loss or weight gain?  No   Diabetes:  Is the patient diabetic?  Yes  If diabetic, was a CBG obtained today?  No  Did the patient bring in their glucometer from home?  No  How often do you monitor your CBG's? Every morning.   Financial Strains and Diabetes Management:  Are you having any financial strains with the device, your supplies or your medication? No .  Does the patient want to be seen by Chronic Care Management for management of their  diabetes?  No  Would the patient like to be referred to a Nutritionist or for Diabetic Management?  No   Diabetic Exams:  Diabetic Eye Exam: Completed 08/13/2019 Diabetic Foot Exam: Overdue, Pt has been advised about the importance in completing this  exam. Pt is scheduled for diabetic foot exam on 09/03/2019.   Interpreter Needed?: No  Information entered by :: CJohnson, LPN   Activities of Daily Living In your present state of health, do you have any difficulty performing the following activities: 08/27/2019 07/28/2019  Hearing? N N  Vision? N N  Difficulty concentrating or making decisions? N N  Walking or climbing stairs? N N  Dressing or bathing? N N  Doing errands, shopping? N -  Preparing Food and eating ? N -  Using the Toilet? N -  In the past six months, have you accidently leaked urine? Y -  Comment wears liner -  Do you have problems with loss of bowel control? N -  Managing your Medications? N -  Managing your Finances? N -  Housekeeping or managing your Housekeeping? N -  Some recent data might be hidden    Patient Care Team: Jinny Sanders, MD as PCP - General (Family Medicine) [name], MD as PCP - Cardiology (Cardiology) Thompson Grayer, MD as PCP - Electrophysiology (Cardiology) [name], MD as Consulting Physician (Internal Medicine) [name], MD as Consulting Physician (Cardiology) Wilhelmina Mcardle, MD (Inactive) as Consulting Physician (Pulmonary Disease) Jinny Sanders, MD  Indicate any recent Medical Services you may have received from other than [facility] providers in the past year (date may be approximate).     Assessment:   This is a routine wellness examination for [place].  Hearing/Vision screen  Hearing Screening   125Hz  250Hz  500Hz  1000Hz  2000Hz  3000Hz  4000Hz  6000Hz  8000Hz   Right ear:           Left ear:           Vision Screening Comments: Patient gets annual eye exams   Dietary issues and exercise activities  discussed: Current Exercise Habits: The patient does not participate in regular exercise at present, Exercise limited by: None identified  Goals    . DIET - INCREASE WATER INTAKE     Starting 08/17/2018, I will attempt to drink at least 6 glasses of water daily.     . Patient Stated     08/27/2019, I will maintain and continue medications as prescribed.       Depression Screen PHQ 2/9 Scores 08/27/2019 08/17/2018 05/26/2017 11/20/2016 05/10/2016 05/16/2015 05/13/2014  PHQ - 2 Score 0 0 0 5 0 0 0  PHQ- 9 Score 0 0 0 19 - - -    Fall Risk Fall Risk  08/27/2019 08/17/2018 05/26/2017 05/10/2016 05/16/2015  Falls in the past year? 1 1 No No No  Comment - accidental fall in yard - - -  Number falls in past yr: 0 0 - - -  Injury with Fall? 0 1 - - -  Risk for fall due to : Medication side effect - - - -  Follow up Falls evaluation completed;Falls prevention discussed - - - -    Any stairs in or around the home? Yes  If so, are there any without handrails? No  Home free of loose throw rugs in walkways, pet beds, electrical cords, etc? Yes  Adequate lighting in your home to reduce risk of falls? Yes   ASSISTIVE DEVICES UTILIZED TO PREVENT FALLS:  Life alert? No  Use of a cane, walker or w/c? No  Grab bars in the bathroom? No  Shower chair or bench in shower? No  Elevated toilet seat or a handicapped toilet? No   TIMED UP AND GO:  Was the  test performed? N/A, telephonic visit .    Cognitive Function: MMSE - Mini Mental State Exam 08/27/2019 08/17/2018 05/26/2017 05/10/2016  Orientation to time 5 5 5 5   Orientation to Place 5 5 5 5   Registration 3 3 3 3   Attention/ Calculation 5 0 0 0  Recall 3 3 3 2   Recall-comments - - - pt was unable to recall 1 of 3 words  Language- name 2 objects - 0 0 0  Language- repeat 1 1 1 1   Language- follow 3 step command - 0 3 3  Language- read & follow direction - 0 0 0  Write a sentence - 0 0 0  Copy design - 0 0 0  Total score - 17 20 19   Mini  Cog  Mini-Cog screen was completed. Maximum score is 22. A value of 0 denotes this part of the MMSE was not completed or the patient failed this part of the Mini-Cog screening.       Immunizations Immunization History  Administered Date(s) Administered  . Fluad Quad(high Dose 65+) 11/17/2018  . Influenza Split 01/03/2011, 01/17/2012  . Influenza Whole 11/25/2007, 01/18/2009, 11/28/2009  . Influenza, High Dose Seasonal PF 12/19/2016, 12/01/2017  . Influenza,inj,Quad PF,6+ Mos 01/06/2013, 12/07/2013, 12/14/2014, 11/24/2015  . Pneumococcal Conjugate-13 05/11/2013  . Pneumococcal Polysaccharide-23 01/17/2012, 05/26/2017  . Td 04/10/2010  . Zoster 05/13/2014    TDAP status: Up to date Flu Vaccine status: Up to date Pneumococcal vaccine status: Up to date Covid-19 vaccine status: Information provided on how to obtain vaccines.   Qualifies for Shingles Vaccine? Yes   Zostavax completed Yes   Shingrix Completed?: No.    Education has been provided regarding the importance of this vaccine. Patient has been advised to call insurance company to determine out of pocket expense if they have not yet received this vaccine. Advised may also receive vaccine at local pharmacy or Health Dept. Verbalized acceptance and understanding.  Screening Tests Health Maintenance  Topic Date Due  . COVID-19 Vaccine (1) Never done  . DEXA SCAN  06/21/2019  . URINE MICROALBUMIN  08/17/2019  . FOOT EXAM  08/21/2019  . INFLUENZA VACCINE  10/03/2019  . HEMOGLOBIN A1C  11/17/2019  . TETANUS/TDAP  04/10/2020  . MAMMOGRAM  07/21/2020  . OPHTHALMOLOGY EXAM  08/12/2020  . COLONOSCOPY  09/03/2027  . Hepatitis C Screening  Completed  . PNA vac Low Risk Adult  Completed    Health Maintenance  Health Maintenance Due  Topic Date Due  . COVID-19 Vaccine (1) Never done  . DEXA SCAN  06/21/2019  . URINE MICROALBUMIN  08/17/2019  . FOOT EXAM  08/21/2019    Colorectal cancer screening: Completed 09/02/2017.  Repeat every 10 years Mammogram status: Completed 07/22/2019. Repeat every year Bone Density status: due, will discuss with provider  Lung Cancer Screening: (Low Dose CT Chest recommended if Age 16-80 years, 30 pack-year currently smoking OR have quit w/in 15years.) does not qualify.     Additional Screening:  Hepatitis C Screening: does not qualify; Completed 05/09/2015  Vision Screening: Recommended annual ophthalmology exams for early detection of glaucoma and other disorders of the eye. Is the patient up to date with their annual eye exam?  Yes  Who is the provider or what is the name of the office in which the patient attends annual eye exams? [facility] If pt is not established with a provider, would they like to be referred to a provider to establish care? No .   Dental  Screening: Recommended annual dental exams for proper oral hygiene  Community Resource Referral / Chronic Care Management: CRR required this visit?  No   CCM required this visit?  No      Plan:     I have personally reviewed and noted the following in the patient's chart:   . Medical and social history . Use of alcohol, tobacco or illicit drugs  . Current medications and supplements . Functional ability and status . Nutritional status . Physical activity . Advanced directives . List of other physicians . Hospitalizations, surgeries, and ER visits in previous 12 months . Vitals . Screenings to include cognitive, depression, and falls . Referrals and appointments  In addition, I have reviewed and discussed with patient certain preventive protocols, quality metrics, and best practice recommendations. A written personalized care plan for preventive services as well as general preventive health recommendations were provided to patient.    Due to this being a telephonic visit, the after visit summary with patients personalized plan was offered to patient via mail or my-chart.  Patient preferred to  pick up at office at next visit.  Andrez Grime, LPN   [id number]

## 2019-08-30 ENCOUNTER — Other Ambulatory Visit: Payer: Self-pay | Admitting: Internal Medicine

## 2019-08-30 NOTE — Progress Notes (Signed)
No critical labs need to be addressed urgently. We will discuss labs in detail at upcoming office visit.   

## 2019-09-02 ENCOUNTER — Ambulatory Visit: Payer: Medicare HMO | Admitting: Cardiovascular Disease

## 2019-09-02 ENCOUNTER — Encounter: Payer: Self-pay | Admitting: Cardiovascular Disease

## 2019-09-02 ENCOUNTER — Other Ambulatory Visit: Payer: Self-pay

## 2019-09-02 VITALS — BP 122/68 | HR 92 | Ht 65.0 in | Wt 173.0 lb

## 2019-09-02 DIAGNOSIS — I4819 Other persistent atrial fibrillation: Secondary | ICD-10-CM

## 2019-09-02 DIAGNOSIS — G4733 Obstructive sleep apnea (adult) (pediatric): Secondary | ICD-10-CM | POA: Diagnosis not present

## 2019-09-02 DIAGNOSIS — I471 Supraventricular tachycardia: Secondary | ICD-10-CM | POA: Diagnosis not present

## 2019-09-02 DIAGNOSIS — I10 Essential (primary) hypertension: Secondary | ICD-10-CM | POA: Diagnosis not present

## 2019-09-02 DIAGNOSIS — E782 Mixed hyperlipidemia: Secondary | ICD-10-CM

## 2019-09-02 MED ORDER — METOPROLOL TARTRATE 25 MG PO TABS
25.0000 mg | ORAL_TABLET | Freq: Two times a day (BID) | ORAL | 1 refills | Status: DC
Start: 2019-09-02 — End: 2019-09-07

## 2019-09-02 NOTE — Patient Instructions (Addendum)
Medication Instructions:   Discontinue Dilitazem. Start Metoprolol Tartrate 25 mg twice daily by mouth.  *If you need a refill on your cardiac medications before your next appointment, please call your pharmacy*   Lab Work:  BMET CBC If you have labs (blood work) drawn today and your tests are completely normal, you will receive your results only by: [name] MyChart Message (if you have MyChart) OR . A paper copy in the mail If you have any lab test that is abnormal or we need to change your treatment, we will call you to review the results.   Testing/Procedures:  Your physician has recommended that you have a Cardioversion (DCCV). Electrical Cardioversion uses a jolt of electricity to your heart either through paddles or wired patches attached to your chest. This is a controlled, usually prescheduled, procedure. Defibrillation is done under light anesthesia in the hospital, and you usually go home the day of the procedure. This is done to get your heart back into a normal rhythm. You are not awake for the procedure. Please see the instruction sheet given to you today.     Follow-Up: At [facility], you and your health needs are our priority.  As part of our continuing mission to provide you with exceptional heart care, we have created designated Provider Care Teams.  These Care Teams include your primary Cardiologist (physician) and Advanced Practice Providers (APPs -  Physician Assistants and Nurse Practitioners) who all work together to provide you with the care you need, when you need it.  We recommend signing up for the patient portal called "MyChart".  Sign up information is provided on this After Visit Summary.  MyChart is used to connect with patients for Virtual Visits (Telemedicine).  Patients are able to view lab/test results, encounter notes, upcoming appointments, etc.  Non-urgent messages can be sent to your provider as well.   To learn more about what you can do with MyChart,  go to [email or web address].    Your next appointment:   2 week(s)  The format for your next appointment:   In Person  Provider:    You may see Kathlyn Sacramento, MD or one of the following Advanced Practice Providers on your designated Care Team:    Murray Hodgkins, NP  Christell Faith, PA-C  Marrianne Mood, PA-C    Other Instructions  Do not miss any doses of your Eliquis. If you do please call the office to let us know.  You are scheduled for a Cardioversion on ___7/6/2021_____________ with Dr.__End_________ Please arrive at the [facility] at ___06:30______ a.m. on the day of your procedure.  DIET INSTRUCTIONS:  Nothing to eat or drink after midnight except your medications with a              sip of water.         1) Labs: _BMET AND CBC. 2) COVID SWAB: Tomorrow [facility] between 8:00 am- 1:00 pm.  3) Medications:  YOU MAY TAKE ALL of your remaining medications with a small amount of water.  4) Must have a responsible person to drive you home.  5) Bring a current list of your medications and current insurance cards.    If you have any questions after you get home, please call the office at [phone]  w

## 2019-09-02 NOTE — H&P (View-Only) (Signed)
Cardiology Office Note   Date:  09/02/2019   ID:  [name], DOB 31-May-1945, MRN [id number]  PCP:  Jinny Sanders, MD  Cardiologist:   Kathlyn Sacramento, MD   Chief Complaint  Patient presents with  . Other    1 month follow up. patient c/o afib for 2 days. patient has passed out a few times. Meds reviewed verbally with patient.       History of Present Illness: [name] is a 74 y.o. female who presents for a followup visit regarding persistent atrial fibrillation and mitral valve prolapse.  She had previous cardiac catheterization in October of 2010 for atypical chest pain. It showed no significant coronary artery disease with mildly elevated left ventricular end-diastolic pressure and normal ejection fraction. She has known history of diabetes, sleep apnea on CPAP, pulmonary hypertension, essential hypertension, COVID-19 infection in December 2020, obesity, GERD and hyperlipidemia. Nuclear stress test in July 2015 was normal. She had multiple cardioversions in the past and had A. fib ablation in October 2018.  She had atrial tachycardia in July 2020 that required cardioversion.  She had COVID-19 infection in December 2020 but did not require hospitalization.  Echocardiogram in February 2021 showed an EF of 55 to 60%, mildly dilated left ventricle, severely dilated left atrium, mild mitral regurgitation and mild aortic sclerosis without stenosis. She had recurrent atrial fibrillation in May with associated hypotension.  She was scheduled for cardioversion but subsequently was found to be in sinus rhythm.  She went back into atrial fibrillation yesterday and felt dizzy right away and had a syncopal episode.  She reports having syncopal episodes in the past when she goes into atrial fibrillation.  No chest pain or shortness of breath.  She just feels fatigued and has no energy.  Past Medical History:  Diagnosis Date  . Anemia   . Anxiety   . Atrial fibrillation ([place])   . Colon  polyps   . Constipation   . Depressive disorder, not elsewhere classified   . Diabetes mellitus without complication ([place])   . Diaphragmatic hernia without mention of obstruction or gangrene   . Disorders of bursae and tendons in shoulder region, unspecified   . Diverticulosis of colon (without mention of hemorrhage)   . External hemorrhoid   . Fundic gland polyps of stomach, benign   . GERD (gastroesophageal reflux disease)   . History of hiatal hernia   . HTN (hypertension)   . Hypertension   . Hypothyroidism   . Internal hemorrhoids without mention of complication   . Moderate mitral regurgitation   . Obesity, unspecified   . OSA on CPAP    compliant with CPAP  . Other urinary incontinence   . Persistent atrial fibrillation ([place])   . Pure hypercholesterolemia   . Type II diabetes mellitus ([place])   . Urinary frequency   . Urinary urgency   . Vitamin B 12 deficiency     Past Surgical History:  Procedure Laterality Date  . ABDOMINAL HYSTERECTOMY    . ABDOMINAL HYSTERECTOMY  1972   Have both ovaries  . ATRIAL FIBRILLATION ABLATION  12/17/2016  . ATRIAL FIBRILLATION ABLATION N/A 12/17/2016   Procedure: ATRIAL FIBRILLATION ABLATION;  Surgeon: Thompson Grayer, MD;  Location: [facility];  Service: Cardiovascular;  Laterality: N/A;  . [place]   [facility]; "Dr. [name]"  . CARDIOVERSION N/A 05/27/2016   Procedure: CARDIOVERSION;  Surgeon: [name], MD;  Location: [facility];  Service:  Cardiovascular;  Laterality: N/A;  . CARDIOVERSION N/A 11/25/2016   Procedure: CARDIOVERSION;  Surgeon: [name], MD;  Location: [facility];  Service: Cardiovascular;  Laterality: N/A;  . CARDIOVERSION N/A 09/18/2018   Procedure: CARDIOVERSION;  Surgeon: [name], MD;  Location: [facility];  Service: Cardiovascular;  Laterality: N/A;  . CARDIOVERSION N/A 07/28/2019   Procedure: CARDIOVERSION;  Surgeon: Kate Sable, MD;  Location: [facility];  Service:  Cardiovascular;  Laterality: N/A;  . CATARACT EXTRACTION W/ INTRAOCULAR LENS  IMPLANT, BILATERAL Bilateral   . COLONOSCOPY    . ELECTROPHYSIOLOGIC STUDY N/A 01/24/2016   Procedure: CARDIOVERSION;  Surgeon: [name], MD;  Location: [facility];  Service: Cardiovascular;  Laterality: N/A;  . LAPAROSCOPIC CHOLECYSTECTOMY    . REDUCTION MAMMAPLASTY Bilateral   . TONSILLECTOMY    . TOTAL HIP ARTHROPLASTY Right 05/12/2018   Procedure: TOTAL HIP ARTHROPLASTY ANTERIOR APPROACH;  Surgeon: Paralee Cancel, MD;  Location: [facility];  Service: Orthopedics;  Laterality: Right;  70 mins     Current Outpatient Medications  Medication Sig Dispense Refill  . acetaminophen (TYLENOL) 500 MG tablet Take 1,000 mg by mouth every 6 (six) hours as needed for mild pain or headache.    [name] acetaminophen (TYLENOL) 500 MG tablet Take 1,000 mg by mouth daily as needed for mild pain or headache.    [name] amoxicillin (AMOXIL) 500 MG capsule Take 2,000 mg by mouth once. Before dental procedures    . atorvastatin (LIPITOR) 10 MG tablet TAKE 1 TABLET BY MOUTH EVERYDAY AT BEDTIME 90 tablet 0  . Calcium Carb-Ergocalciferol 500-200 MG-UNIT TABS Take 1 tablet by mouth daily.     [name] ELIQUIS 5 MG TABS tablet Take 5 mg by mouth 2 (two) times daily.    [name] escitalopram (LEXAPRO) 10 MG tablet Take 10 mg by mouth daily.    . flecainide (TAMBOCOR) 100 MG tablet Take 1 tablet (100 mg total) by mouth 2 (two) times daily. 180 tablet 3  . metFORMIN (GLUCOPHAGE-XR) 500 MG 24 hr tablet TAKE 2 TABLETS (1,000 MG TOTAL) BY MOUTH 2 (TWO) TIMES DAILY WITH A MEAL. 360 tablet 1  . Multiple Vitamin (MULTIVITAMIN) tablet Take 1 tablet by mouth daily.      Glory Rosebush ULTRA test strip CHECK BLOOD SUGAR 2 TIMES A DAY AS DIRECTED DX CODE E11.9 100 strip 7  . solifenacin (VESICARE) 10 MG tablet Take 10 mg by mouth daily.    . TRADJENTA 5 MG TABS tablet Take 5 mg by mouth daily.    [name] triamcinolone cream (KENALOG) 0.1 % Apply 1 application topically 2 (two) times  daily. 30 g 0   No current facility-administered medications for this visit.    Allergies:   Cortisone, Pravachol [pravastatin sodium], and Contrast media [iodinated diagnostic agents]    Social History:  The patient  reports that she has never smoked. She has never used smokeless tobacco. She reports that she does not drink alcohol and does not use drugs.   Family History:  The patient's family history includes Cancer in her mother; Dementia in her mother; Diabetes in her brother and mother; Hyperlipidemia in her father and mother; Hypertension in her sister; Lung cancer in her father; Pulmonary embolism in her mother; Stroke in her paternal grandfather and paternal grandmother.    ROS:  Please see the history of present illness.   Otherwise, review of systems are positive for none.   All other systems are reviewed and negative.    PHYSICAL EXAM: VS:  BP 122/68 (BP Location: Left Arm, Patient Position: Sitting, Cuff Size: Normal)   Pulse 92   Ht 5\' 5"  (1.651 m)   Wt 173 lb (78.5 kg)   LMP  (LMP Unknown)   SpO2 94%   BMI 28.79 kg/m  , BMI Body mass index is 28.79 kg/m. GEN: Well nourished, well developed, in no acute distress  HEENT: normal  Neck: no JVD, carotid bruits, or masses Cardiac: Irregularly irregular; no  rubs, or gallops,no edema . There is a 2/6 systolic ejection murmur at the base. Respiratory:  clear to auscultation bilaterally, normal work of breathing GI: soft, nontender, nondistended, + BS MS: no deformity or atrophy  Skin: warm and dry, no rash Neuro:  Strength and sensation are intact Psych: euthymic mood, full affect   EKG:  EKG is ordered today. EKG showed atrial fibrillation with ventricular rate of 92 bpm.  Nonspecific T wave changes.   Recent Labs: 07/26/2019: B Natriuretic Peptide 235.5; Hemoglobin 11.7; Magnesium 1.4; Platelets 316; TSH 1.728 08/27/2019: ALT 10; BUN 16; Creatinine, Ser 0.99; Potassium 4.6; Sodium 137    Lipid Panel      Component Value Date/Time   CHOL 145 08/27/2019 0833   CHOL 147 11/20/2016 0919   TRIG 52.0 08/27/2019 0833   HDL 49.10 08/27/2019 0833   HDL 47 11/20/2016 0919   CHOLHDL 3 08/27/2019 0833   VLDL 10.4 08/27/2019 0833   LDLCALC 85 08/27/2019 0833   LDLCALC 89 11/20/2016 0919   LDLDIRECT 163.1 07/04/2011 0922      Wt Readings from Last 3 Encounters:  09/02/19 173 lb (78.5 kg)  08/27/19 169 lb (76.7 kg)  08/05/19 170 lb (77.1 kg)       No flowsheet data found.    ASSESSMENT AND PLAN:  1.   Persistent atrial fibrillation: Unfortunately, she is back in atrial fibrillation and she seems to be symptomatic with this including dizziness, fatigue and syncope.  She is currently on flecainide 100 mg twice daily which was increased in May.  She is on anticoagulation with Eliquis and has not missed any dose. I recommend switching to diltiazem to metoprolol 25 mg twice daily and proceed with cardioversion on Tuesday.  I discussed the procedure in details as well as risks and benefits. I reviewed her most recent echocardiogram.  Mitral regurgitation was moderate at most and thus I do not think this is driving her atrial fibrillation.  However, her left atrium was severely dilated and it might be difficult to keep her in sinus rhythm. I am going to have her follow-up with Dr. Rayann Heman to get his opinion.  2. Mitral valve prolapse with moderate regurgitation: Most recent echocardiogram in February showed that her mitral regurgitation was mild to moderate  3. Hyperlipidemia: Currently on atorvastatin.  4. Sleep apnea: Continue CPAP.  5.  Essential hypertension: Pressure is controlled and if anything her blood pressure goes down when she is in atrial fibrillation.   Disposition:  Follow up with me in 1 month.   Signed,  Kathlyn Sacramento, MD  09/02/2019 3:28 PM    [facility]

## 2019-09-02 NOTE — Progress Notes (Signed)
Cardiology Office Note   Date:  09/02/2019   ID:  [name], DOB Jan 05, 1946, MRN [id number]  PCP:  Jinny Sanders, MD  Cardiologist:   Kathlyn Sacramento, MD   Chief Complaint  Patient presents with  . Other    1 month follow up. patient c/o afib for 2 days. patient has passed out a few times. Meds reviewed verbally with patient.       History of Present Illness: [name] is a 74 y.o. female who presents for a followup visit regarding persistent atrial fibrillation and mitral valve prolapse.  She had previous cardiac catheterization in October of 2010 for atypical chest pain. It showed no significant coronary artery disease with mildly elevated left ventricular end-diastolic pressure and normal ejection fraction. She has known history of diabetes, sleep apnea on CPAP, pulmonary hypertension, essential hypertension, COVID-19 infection in December 2020, obesity, GERD and hyperlipidemia. Nuclear stress test in July 2015 was normal. She had multiple cardioversions in the past and had A. fib ablation in October 2018.  She had atrial tachycardia in July 2020 that required cardioversion.  She had COVID-19 infection in December 2020 but did not require hospitalization.  Echocardiogram in February 2021 showed an EF of 55 to 60%, mildly dilated left ventricle, severely dilated left atrium, mild mitral regurgitation and mild aortic sclerosis without stenosis. She had recurrent atrial fibrillation in May with associated hypotension.  She was scheduled for cardioversion but subsequently was found to be in sinus rhythm.  She went back into atrial fibrillation yesterday and felt dizzy right away and had a syncopal episode.  She reports having syncopal episodes in the past when she goes into atrial fibrillation.  No chest pain or shortness of breath.  She just feels fatigued and has no energy.  Past Medical History:  Diagnosis Date  . Anemia   . Anxiety   . Atrial fibrillation ([place])   . Colon  polyps   . Constipation   . Depressive disorder, not elsewhere classified   . Diabetes mellitus without complication ([place])   . Diaphragmatic hernia without mention of obstruction or gangrene   . Disorders of bursae and tendons in shoulder region, unspecified   . Diverticulosis of colon (without mention of hemorrhage)   . External hemorrhoid   . Fundic gland polyps of stomach, benign   . GERD (gastroesophageal reflux disease)   . History of hiatal hernia   . HTN (hypertension)   . Hypertension   . Hypothyroidism   . Internal hemorrhoids without mention of complication   . Moderate mitral regurgitation   . Obesity, unspecified   . OSA on CPAP    compliant with CPAP  . Other urinary incontinence   . Persistent atrial fibrillation ([place])   . Pure hypercholesterolemia   . Type II diabetes mellitus ([place])   . Urinary frequency   . Urinary urgency   . Vitamin B 12 deficiency     Past Surgical History:  Procedure Laterality Date  . ABDOMINAL HYSTERECTOMY    . ABDOMINAL HYSTERECTOMY  1972   Have both ovaries  . ATRIAL FIBRILLATION ABLATION  12/17/2016  . ATRIAL FIBRILLATION ABLATION N/A 12/17/2016   Procedure: ATRIAL FIBRILLATION ABLATION;  Surgeon: Thompson Grayer, MD;  Location: [facility];  Service: Cardiovascular;  Laterality: N/A;  . [place]   [facility]; "Dr. [name]"  . CARDIOVERSION N/A 05/27/2016   Procedure: CARDIOVERSION;  Surgeon: [name], MD;  Location: [facility];  Service:  Cardiovascular;  Laterality: N/A;  . CARDIOVERSION N/A 11/25/2016   Procedure: CARDIOVERSION;  Surgeon: [name], MD;  Location: [facility];  Service: Cardiovascular;  Laterality: N/A;  . CARDIOVERSION N/A 09/18/2018   Procedure: CARDIOVERSION;  Surgeon: [name], MD;  Location: [facility];  Service: Cardiovascular;  Laterality: N/A;  . CARDIOVERSION N/A 07/28/2019   Procedure: CARDIOVERSION;  Surgeon: Kate Sable, MD;  Location: [facility];  Service:  Cardiovascular;  Laterality: N/A;  . CATARACT EXTRACTION W/ INTRAOCULAR LENS  IMPLANT, BILATERAL Bilateral   . COLONOSCOPY    . ELECTROPHYSIOLOGIC STUDY N/A 01/24/2016   Procedure: CARDIOVERSION;  Surgeon: [name], MD;  Location: [facility];  Service: Cardiovascular;  Laterality: N/A;  . LAPAROSCOPIC CHOLECYSTECTOMY    . REDUCTION MAMMAPLASTY Bilateral   . TONSILLECTOMY    . TOTAL HIP ARTHROPLASTY Right 05/12/2018   Procedure: TOTAL HIP ARTHROPLASTY ANTERIOR APPROACH;  Surgeon: Paralee Cancel, MD;  Location: [facility];  Service: Orthopedics;  Laterality: Right;  70 mins     Current Outpatient Medications  Medication Sig Dispense Refill  . acetaminophen (TYLENOL) 500 MG tablet Take 1,000 mg by mouth every 6 (six) hours as needed for mild pain or headache.    [name] acetaminophen (TYLENOL) 500 MG tablet Take 1,000 mg by mouth daily as needed for mild pain or headache.    [name] amoxicillin (AMOXIL) 500 MG capsule Take 2,000 mg by mouth once. Before dental procedures    . atorvastatin (LIPITOR) 10 MG tablet TAKE 1 TABLET BY MOUTH EVERYDAY AT BEDTIME 90 tablet 0  . Calcium Carb-Ergocalciferol 500-200 MG-UNIT TABS Take 1 tablet by mouth daily.     [name] ELIQUIS 5 MG TABS tablet Take 5 mg by mouth 2 (two) times daily.    [name] escitalopram (LEXAPRO) 10 MG tablet Take 10 mg by mouth daily.    . flecainide (TAMBOCOR) 100 MG tablet Take 1 tablet (100 mg total) by mouth 2 (two) times daily. 180 tablet 3  . metFORMIN (GLUCOPHAGE-XR) 500 MG 24 hr tablet TAKE 2 TABLETS (1,000 MG TOTAL) BY MOUTH 2 (TWO) TIMES DAILY WITH A MEAL. 360 tablet 1  . Multiple Vitamin (MULTIVITAMIN) tablet Take 1 tablet by mouth daily.      Glory Rosebush ULTRA test strip CHECK BLOOD SUGAR 2 TIMES A DAY AS DIRECTED DX CODE E11.9 100 strip 7  . solifenacin (VESICARE) 10 MG tablet Take 10 mg by mouth daily.    . TRADJENTA 5 MG TABS tablet Take 5 mg by mouth daily.    [name] triamcinolone cream (KENALOG) 0.1 % Apply 1 application topically 2 (two) times  daily. 30 g 0   No current facility-administered medications for this visit.    Allergies:   Cortisone, Pravachol [pravastatin sodium], and Contrast media [iodinated diagnostic agents]    Social History:  The patient  reports that she has never smoked. She has never used smokeless tobacco. She reports that she does not drink alcohol and does not use drugs.   Family History:  The patient's family history includes Cancer in her mother; Dementia in her mother; Diabetes in her brother and mother; Hyperlipidemia in her father and mother; Hypertension in her sister; Lung cancer in her father; Pulmonary embolism in her mother; Stroke in her paternal grandfather and paternal grandmother.    ROS:  Please see the history of present illness.   Otherwise, review of systems are positive for none.   All other systems are reviewed and negative.    PHYSICAL EXAM: VS:  BP 122/68 (BP Location: Left Arm, Patient Position: Sitting, Cuff Size: Normal)   Pulse 92   Ht 5\' 5"  (1.651 m)   Wt 173 lb (78.5 kg)   LMP  (LMP Unknown)   SpO2 94%   BMI 28.79 kg/m  , BMI Body mass index is 28.79 kg/m. GEN: Well nourished, well developed, in no acute distress  HEENT: normal  Neck: no JVD, carotid bruits, or masses Cardiac: Irregularly irregular; no  rubs, or gallops,no edema . There is a 2/6 systolic ejection murmur at the base. Respiratory:  clear to auscultation bilaterally, normal work of breathing GI: soft, nontender, nondistended, + BS MS: no deformity or atrophy  Skin: warm and dry, no rash Neuro:  Strength and sensation are intact Psych: euthymic mood, full affect   EKG:  EKG is ordered today. EKG showed atrial fibrillation with ventricular rate of 92 bpm.  Nonspecific T wave changes.   Recent Labs: 07/26/2019: B Natriuretic Peptide 235.5; Hemoglobin 11.7; Magnesium 1.4; Platelets 316; TSH 1.728 08/27/2019: ALT 10; BUN 16; Creatinine, Ser 0.99; Potassium 4.6; Sodium 137    Lipid Panel      Component Value Date/Time   CHOL 145 08/27/2019 0833   CHOL 147 11/20/2016 0919   TRIG 52.0 08/27/2019 0833   HDL 49.10 08/27/2019 0833   HDL 47 11/20/2016 0919   CHOLHDL 3 08/27/2019 0833   VLDL 10.4 08/27/2019 0833   LDLCALC 85 08/27/2019 0833   LDLCALC 89 11/20/2016 0919   LDLDIRECT 163.1 07/04/2011 0922      Wt Readings from Last 3 Encounters:  09/02/19 173 lb (78.5 kg)  08/27/19 169 lb (76.7 kg)  08/05/19 170 lb (77.1 kg)       No flowsheet data found.    ASSESSMENT AND PLAN:  1.   Persistent atrial fibrillation: Unfortunately, she is back in atrial fibrillation and she seems to be symptomatic with this including dizziness, fatigue and syncope.  She is currently on flecainide 100 mg twice daily which was increased in May.  She is on anticoagulation with Eliquis and has not missed any dose. I recommend switching to diltiazem to metoprolol 25 mg twice daily and proceed with cardioversion on Tuesday.  I discussed the procedure in details as well as risks and benefits. I reviewed her most recent echocardiogram.  Mitral regurgitation was moderate at most and thus I do not think this is driving her atrial fibrillation.  However, her left atrium was severely dilated and it might be difficult to keep her in sinus rhythm. I am going to have her follow-up with Dr. Rayann Heman to get his opinion.  2. Mitral valve prolapse with moderate regurgitation: Most recent echocardiogram in February showed that her mitral regurgitation was mild to moderate  3. Hyperlipidemia: Currently on atorvastatin.  4. Sleep apnea: Continue CPAP.  5.  Essential hypertension: Pressure is controlled and if anything her blood pressure goes down when she is in atrial fibrillation.   Disposition:  Follow up with me in 1 month.   Signed,  Kathlyn Sacramento, MD  09/02/2019 3:28 PM    [facility]

## 2019-09-03 ENCOUNTER — Other Ambulatory Visit
Admission: RE | Admit: 2019-09-03 | Discharge: 2019-09-03 | Disposition: A | Discharge status: Home / Self Care | Payer: Medicare HMO | Source: Ambulatory Visit | Attending: Cardiovascular Disease | Admitting: Cardiovascular Disease

## 2019-09-03 ENCOUNTER — Encounter: Payer: Self-pay | Admitting: Family Medicine

## 2019-09-03 ENCOUNTER — Ambulatory Visit (INDEPENDENT_AMBULATORY_CARE_PROVIDER_SITE_OTHER): Payer: Medicare HMO | Admitting: Family Medicine

## 2019-09-03 VITALS — BP 148/80 | HR 68 | Ht 65.0 in | Wt 173.0 lb

## 2019-09-03 DIAGNOSIS — I4819 Other persistent atrial fibrillation: Secondary | ICD-10-CM

## 2019-09-03 DIAGNOSIS — I10 Essential (primary) hypertension: Secondary | ICD-10-CM | POA: Diagnosis not present

## 2019-09-03 DIAGNOSIS — E1159 Type 2 diabetes mellitus with other circulatory complications: Secondary | ICD-10-CM

## 2019-09-03 DIAGNOSIS — Z0000 Encounter for general adult medical examination without abnormal findings: Secondary | ICD-10-CM | POA: Diagnosis not present

## 2019-09-03 DIAGNOSIS — Z01812 Encounter for preprocedural laboratory examination: Secondary | ICD-10-CM | POA: Insufficient documentation

## 2019-09-03 DIAGNOSIS — Z20822 Contact with and (suspected) exposure to covid-19: Secondary | ICD-10-CM | POA: Diagnosis not present

## 2019-09-03 DIAGNOSIS — E119 Type 2 diabetes mellitus without complications: Secondary | ICD-10-CM | POA: Diagnosis not present

## 2019-09-03 DIAGNOSIS — I152 Hypertension secondary to endocrine disorders: Secondary | ICD-10-CM

## 2019-09-03 DIAGNOSIS — E1169 Type 2 diabetes mellitus with other specified complication: Secondary | ICD-10-CM

## 2019-09-03 DIAGNOSIS — E785 Hyperlipidemia, unspecified: Secondary | ICD-10-CM | POA: Diagnosis not present

## 2019-09-03 LAB — BASIC METABOLIC PANEL
BUN/Creatinine Ratio: 15 (ref 12–28)
BUN: 15 mg/dL (ref 8–27)
CO2: 22 mmol/L (ref 20–29)
Calcium: 9.2 mg/dL (ref 8.7–10.3)
Chloride: 98 mmol/L (ref 96–106)
Creatinine, Ser: 0.99 mg/dL (ref 0.57–1.00)
GFR calc Af Amer: 66 mL/min/{1.73_m2} (ref >59)
GFR calc non Af Amer: 57 mL/min/{1.73_m2} — ABNORMAL LOW (ref >59)
Glucose: 115 mg/dL — ABNORMAL HIGH (ref 65–99)
Potassium: 5.1 mmol/L (ref 3.5–5.2)
Sodium: 133 mmol/L — ABNORMAL LOW (ref 134–144)

## 2019-09-03 LAB — CBC WITH DIFFERENTIAL/PLATELET
Basophils Absolute: 0 10*3/uL (ref 0.0–0.2)
Basos: 0 %
EOS (ABSOLUTE): 0.1 10*3/uL (ref 0.0–0.4)
Eos: 1 %
Hematocrit: 35.4 % (ref 34.0–46.6)
Hemoglobin: 11.7 g/dL (ref 11.1–15.9)
Immature Grans (Abs): 0 10*3/uL (ref 0.0–0.1)
Immature Granulocytes: 0 %
Lymphocytes Absolute: 2 10*3/uL (ref 0.7–3.1)
Lymphs: 27 %
MCH: 28.7 pg (ref 26.6–33.0)
MCHC: 33.1 g/dL (ref 31.5–35.7)
MCV: 87 fL (ref 79–97)
Monocytes Absolute: 0.5 10*3/uL (ref 0.1–0.9)
Monocytes: 7 %
Neutrophils Absolute: 4.6 10*3/uL (ref 1.4–7.0)
Neutrophils: 65 %
Platelets: 339 10*3/uL (ref 150–450)
RBC: 4.07 x10E6/uL (ref 3.77–5.28)
RDW: 13 % (ref 11.7–15.4)
WBC: 7.2 10*3/uL (ref 3.4–10.8)

## 2019-09-03 LAB — SARS CORONAVIRUS 2 (TAT 6-24 HRS): SARS Coronavirus 2: NEGATIVE

## 2019-09-03 NOTE — Patient Instructions (Signed)
When Afib stabilized exercise as able.  Keep up good work on healthy eating!

## 2019-09-03 NOTE — Assessment & Plan Note (Signed)
Rate controlled but remains in afib.  Has upcoming cardioversion.

## 2019-09-03 NOTE — Assessment & Plan Note (Signed)
Now on metoprolol in last 24 hours.. followed by cardiology.

## 2019-09-03 NOTE — Assessment & Plan Note (Signed)
Good control followed by ENDO. Due for microalbumin.[name] she wishes to wait until follow up with  ENDO

## 2019-09-03 NOTE — Assessment & Plan Note (Signed)
Well controlled. Continue current medication.  

## 2019-09-03 NOTE — Progress Notes (Signed)
Chief Complaint  Patient presents with  . Annual Exam    History of Present Illness: HPI  The patient presents for  complete physical and review of chronic health problems. He/She also has the following acute concerns today:  Currently in afib  The patient saw a LPN or RN for medicare wellness visit.  Prevention and wellness was reviewed in detail. Note reviewed and important notes copied below.  Health Maintenance: Dexa- due Foot exam- due COVID- due   Abnormal Screenings: none  09/03/19  Currently in Afib and has upcoming cardioversion.   DM  On metformin and januvia  Followed  FBS 90-125 Lab Results  Component Value Date   HGBA1C 6.2 (A) 05/17/2019   Minimal exercise and moderate diet.    Hypertension:    Yesterday cardiology stopped diltiazem.. change to metoprolol.  BP Readings from Last 3 Encounters:  09/03/19 (!) 148/80  09/02/19 122/68  08/27/19 (!) 125/49  Using medication without problems or lightheadedness:  Chest pain with exertion: none Edema: none Short of breath: Yes from afib Average home BPs: Other issues:  Elevated Cholesterol:  Lab Results  Component Value Date   CHOL 145 08/27/2019   HDL 49.10 08/27/2019   LDLCALC 85 08/27/2019   LDLDIRECT 163.1 07/04/2011   TRIG 52.0 08/27/2019   CHOLHDL 3 08/27/2019    Using medications without problems:none Muscle aches: none Diet compliance: moderate Exercise: minimal Other complaints:    This visit occurred during the SARS-CoV-2 public health emergency.  Safety protocols were in place, including screening questions prior to the visit, additional usage of staff PPE, and extensive cleaning of exam room while observing appropriate contact time as indicated for disinfecting solutions.   COVID 19 screen:  No recent travel or known exposure to COVID19 The patient denies respiratory symptoms of COVID 19 at this time. The importance of social distancing was discussed today.     Review of  Systems  Constitutional: Positive for malaise/fatigue. Negative for chills and fever.  HENT: Negative for congestion and ear pain.   Eyes: Negative for pain and redness.  Respiratory: Positive for shortness of breath. Negative for cough.   Cardiovascular: Positive for palpitations. Negative for chest pain and leg swelling.  Gastrointestinal: Negative for abdominal pain, blood in stool, constipation, diarrhea, nausea and vomiting.  Genitourinary: Negative for dysuria.  Musculoskeletal: Negative for falls and myalgias.  Skin: Negative for rash.  Neurological: Negative for dizziness.  Psychiatric/Behavioral: Negative for depression. The patient is not nervous/anxious.       Clinical Support from 08/27/2019 in [place] at [facility] Total Score 0        Past Medical History:  Diagnosis Date  . Anemia   . Anxiety   . Atrial fibrillation ([place])   . Colon polyps   . Constipation   . Depressive disorder, not elsewhere classified   . Diabetes mellitus without complication ([place])   . Diaphragmatic hernia without mention of obstruction or gangrene   . Disorders of bursae and tendons in shoulder region, unspecified   . Diverticulosis of colon (without mention of hemorrhage)   . External hemorrhoid   . Fundic gland polyps of stomach, benign   . GERD (gastroesophageal reflux disease)   . History of hiatal hernia   . HTN (hypertension)   . Hypertension   . Hypothyroidism   . Internal hemorrhoids without mention of complication   . Moderate mitral regurgitation   . Obesity, unspecified   . OSA on CPAP  compliant with CPAP  . Other urinary incontinence   . Persistent atrial fibrillation ([place])   . Pure hypercholesterolemia   . Type II diabetes mellitus ([place])   . Urinary frequency   . Urinary urgency   . Vitamin B 12 deficiency     reports that she has never smoked. She has never used smokeless tobacco. She reports that she does not drink alcohol and does not use  drugs.   Current Outpatient Medications:  .  acetaminophen (TYLENOL) 500 MG tablet, Take 1,000 mg by mouth daily as needed for mild pain or headache., Disp: , Rfl:  .  amoxicillin (AMOXIL) 500 MG capsule, Take 2,000 mg by mouth See admin instructions. Take 2000 mg by mouth 1 hour prior to dental procedures, Disp: , Rfl:  .  atorvastatin (LIPITOR) 10 MG tablet, TAKE 1 TABLET BY MOUTH EVERYDAY AT BEDTIME (Patient taking differently: Take 10 mg by mouth at bedtime. ), Disp: 90 tablet, Rfl: 0 .  calcium carbonate (OS-CAL - DOSED IN MG OF ELEMENTAL CALCIUM) 1250 (500 Ca) MG tablet, Take 1 tablet by mouth daily with breakfast., Disp: , Rfl:  .  ELIQUIS 5 MG TABS tablet, Take 5 mg by mouth 2 (two) times daily., Disp: , Rfl:  .  escitalopram (LEXAPRO) 10 MG tablet, Take 10 mg by mouth daily., Disp: , Rfl:  .  flecainide (TAMBOCOR) 100 MG tablet, Take 1 tablet (100 mg total) by mouth 2 (two) times daily., Disp: 180 tablet, Rfl: 3 .  hydroxypropyl methylcellulose / hypromellose (ISOPTO TEARS / GONIOVISC) 2.5 % ophthalmic solution, Place 1 drop into both eyes 2 (two) times daily as needed for dry eyes., Disp: , Rfl:  .  metFORMIN (GLUCOPHAGE-XR) 500 MG 24 hr tablet, TAKE 2 TABLETS (1,000 MG TOTAL) BY MOUTH 2 (TWO) TIMES DAILY WITH A MEAL., Disp: 360 tablet, Rfl: 1 .  metoprolol tartrate (LOPRESSOR) 25 MG tablet, Take 1 tablet (25 mg total) by mouth 2 (two) times daily., Disp: 180 tablet, Rfl: 1 .  ONETOUCH ULTRA test strip, CHECK BLOOD SUGAR 2 TIMES A DAY AS DIRECTED DX CODE E11.9, Disp: 100 strip, Rfl: 7 .  solifenacin (VESICARE) 10 MG tablet, Take 10 mg by mouth daily., Disp: , Rfl:  .  TRADJENTA 5 MG TABS tablet, Take 5 mg by mouth daily., Disp: , Rfl:    Observations/Objective: Blood pressure (!) 148/80, pulse 68, height 5\' 5"  (1.651 m), weight 173 lb (78.5 kg), SpO2 95 %.  Physical Exam Constitutional:      General: She is not in acute distress.    Appearance: Normal appearance. She is  well-developed. She is not ill-appearing or toxic-appearing.  HENT:     Head: Normocephalic.     Right Ear: Hearing, tympanic membrane, ear canal and external ear normal.     Left Ear: Hearing, tympanic membrane, ear canal and external ear normal.     Nose: Nose normal.  Eyes:     General: Lids are normal. Lids are everted, no foreign bodies appreciated.     Conjunctiva/sclera: Conjunctivae normal.     Pupils: Pupils are equal, round, and reactive to light.  Neck:     Thyroid: No thyroid mass or thyromegaly.     Vascular: No carotid bruit.     Trachea: Trachea normal.  Cardiovascular:     Rate and Rhythm: Normal rate. Rhythm irregularly irregular.     Heart sounds: Normal heart sounds, S1 normal and S2 normal. No murmur heard.  No gallop.   Pulmonary:  Effort: Pulmonary effort is normal. No respiratory distress.     Breath sounds: Normal breath sounds. No wheezing, rhonchi or rales.  Abdominal:     General: Bowel sounds are normal. There is no distension or abdominal bruit.     Palpations: Abdomen is soft. There is no fluid wave or mass.     Tenderness: There is no abdominal tenderness. There is no guarding or rebound.     Hernia: No hernia is present.  Musculoskeletal:     Cervical back: Normal range of motion and neck supple.  Lymphadenopathy:     Cervical: No cervical adenopathy.  Skin:    General: Skin is warm and dry.     Findings: No rash.  Neurological:     Mental Status: She is alert.     Cranial Nerves: No cranial nerve deficit.     Sensory: No sensory deficit.  Psychiatric:        Mood and Affect: Mood is not anxious or depressed.        Speech: Speech normal.        Behavior: Behavior normal. Behavior is cooperative.        Judgment: Judgment normal.      Diabetic foot exam: Normal inspection No skin breakdown No calluses  Normal DP pulses Normal sensation to light touch and monofilament Nails normal  Assessment and Plan The patient's preventative  maintenance and recommended screening tests for an annual wellness exam were reviewed in full today. Brought up to date unless services declined.  Counselled on the importance of diet, exercise, and its role in overall health and mortality. The patient's FH and SH was reviewed, including their home life, tobacco status, and drug and alcohol status.     DEXA 2016 normal.  Mammogram:  nml  07/2019 Eye: 08/2019 Hep C: done Considering COVID19 vaccine, rx given for shingrix. Otherwise uptodate  Persistent atrial fibrillation (HCC)  Rate controlled but remains in afib.  Has upcoming cardioversion.  Hyperlipidemia associated with type 2 diabetes mellitus ([place]) Well controlled. Continue current medication.   Hypertension associated with diabetes ([place]) Now on metoprolol in last 24 hours.. followed by cardiology.  Controlled diabetes mellitus type II without complication ([place]) Good control followed by ENDO. Due for microalbumin.[name] she wishes to wait until follow up with  ENDO    Eliezer Lofts, MD

## 2019-09-07 ENCOUNTER — Ambulatory Visit
Admission: RE | Admit: 2019-09-07 | Discharge: 2019-09-07 | Disposition: A | Discharge status: Home / Self Care | Payer: Medicare HMO | Attending: Internal Medicine | Admitting: Internal Medicine

## 2019-09-07 ENCOUNTER — Other Ambulatory Visit: Payer: Self-pay

## 2019-09-07 ENCOUNTER — Ambulatory Visit: Payer: Medicare HMO | Admitting: Certified Registered Nurse Anesthetist

## 2019-09-07 ENCOUNTER — Encounter: Payer: Self-pay | Admitting: Internal Medicine

## 2019-09-07 ENCOUNTER — Encounter
Admission: RE | Disposition: A | Discharge status: Home / Self Care | Payer: Self-pay | Source: Home / Self Care | Attending: Internal Medicine

## 2019-09-07 DIAGNOSIS — F329 Major depressive disorder, single episode, unspecified: Secondary | ICD-10-CM | POA: Insufficient documentation

## 2019-09-07 DIAGNOSIS — Z6828 Body mass index (BMI) 28.0-28.9, adult: Secondary | ICD-10-CM | POA: Insufficient documentation

## 2019-09-07 DIAGNOSIS — K219 Gastro-esophageal reflux disease without esophagitis: Secondary | ICD-10-CM | POA: Diagnosis not present

## 2019-09-07 DIAGNOSIS — I341 Nonrheumatic mitral (valve) prolapse: Secondary | ICD-10-CM | POA: Insufficient documentation

## 2019-09-07 DIAGNOSIS — E785 Hyperlipidemia, unspecified: Secondary | ICD-10-CM | POA: Diagnosis not present

## 2019-09-07 DIAGNOSIS — G4733 Obstructive sleep apnea (adult) (pediatric): Secondary | ICD-10-CM | POA: Insufficient documentation

## 2019-09-07 DIAGNOSIS — Z7984 Long term (current) use of oral hypoglycemic drugs: Secondary | ICD-10-CM | POA: Diagnosis not present

## 2019-09-07 DIAGNOSIS — E669 Obesity, unspecified: Secondary | ICD-10-CM | POA: Diagnosis not present

## 2019-09-07 DIAGNOSIS — E538 Deficiency of other specified B group vitamins: Secondary | ICD-10-CM | POA: Insufficient documentation

## 2019-09-07 DIAGNOSIS — Z79899 Other long term (current) drug therapy: Secondary | ICD-10-CM | POA: Diagnosis not present

## 2019-09-07 DIAGNOSIS — Z8616 Personal history of COVID-19: Secondary | ICD-10-CM | POA: Diagnosis not present

## 2019-09-07 DIAGNOSIS — E039 Hypothyroidism, unspecified: Secondary | ICD-10-CM | POA: Insufficient documentation

## 2019-09-07 DIAGNOSIS — E119 Type 2 diabetes mellitus without complications: Secondary | ICD-10-CM | POA: Diagnosis not present

## 2019-09-07 DIAGNOSIS — I10 Essential (primary) hypertension: Secondary | ICD-10-CM | POA: Diagnosis not present

## 2019-09-07 DIAGNOSIS — Z7901 Long term (current) use of anticoagulants: Secondary | ICD-10-CM | POA: Insufficient documentation

## 2019-09-07 DIAGNOSIS — I340 Nonrheumatic mitral (valve) insufficiency: Secondary | ICD-10-CM | POA: Insufficient documentation

## 2019-09-07 DIAGNOSIS — F419 Anxiety disorder, unspecified: Secondary | ICD-10-CM | POA: Diagnosis not present

## 2019-09-07 DIAGNOSIS — I272 Pulmonary hypertension, unspecified: Secondary | ICD-10-CM | POA: Insufficient documentation

## 2019-09-07 DIAGNOSIS — I4819 Other persistent atrial fibrillation: Secondary | ICD-10-CM | POA: Diagnosis not present

## 2019-09-07 DIAGNOSIS — E7800 Pure hypercholesterolemia, unspecified: Secondary | ICD-10-CM | POA: Insufficient documentation

## 2019-09-07 DIAGNOSIS — R69 Illness, unspecified: Secondary | ICD-10-CM | POA: Diagnosis not present

## 2019-09-07 HISTORY — PX: CARDIOVERSION: SHX1299

## 2019-09-07 LAB — GLUCOSE, CAPILLARY: Glucose-Capillary: 114 mg/dL — ABNORMAL HIGH (ref 70–99)

## 2019-09-07 SURGERY — CARDIOVERSION
Anesthesia: General

## 2019-09-07 MED ORDER — PROPOFOL 10 MG/ML IV BOLUS
INTRAVENOUS | Status: DC | PRN
  Administered 2019-09-07: 20 mg via INTRAVENOUS
  Administered 2019-09-07: 40 mg via INTRAVENOUS

## 2019-09-07 MED ORDER — SODIUM CHLORIDE 0.9 % IV SOLN: INTRAVENOUS | Status: DC | PRN

## 2019-09-07 MED ORDER — METOPROLOL TARTRATE 25 MG PO TABS
12.5000 mg | ORAL_TABLET | Freq: Two times a day (BID) | ORAL | Status: DC
Start: 2019-09-07 — End: 2020-01-12

## 2019-09-07 MED ORDER — EPHEDRINE 5 MG/ML INJ
INTRAVENOUS | Status: AC
  Filled 2019-09-07: qty 10

## 2019-09-07 MED ORDER — PHENYLEPHRINE HCL (PRESSORS) 10 MG/ML IV SOLN
INTRAVENOUS | Status: AC
  Filled 2019-09-07: qty 1

## 2019-09-07 MED ORDER — PROPOFOL 10 MG/ML IV BOLUS
INTRAVENOUS | Status: AC
  Filled 2019-09-07: qty 20

## 2019-09-07 MED ORDER — EPHEDRINE SULFATE 50 MG/ML IJ SOLN
INTRAMUSCULAR | Status: DC | PRN
  Administered 2019-09-07: 5 mg via INTRAVENOUS

## 2019-09-07 NOTE — Anesthesia Preprocedure Evaluation (Signed)
Anesthesia Evaluation  Patient identified by MRN, date of birth, ID band Patient awake    Reviewed: Allergy & Precautions, H&P , NPO status , Patient's Chart, lab work & pertinent test results  History of Anesthesia Complications Negative for: history of anesthetic complications  Airway Mallampati: III  TM Distance: <3 FB Neck ROM: limited    Dental  (+) Chipped   Pulmonary neg shortness of breath, sleep apnea and Continuous Positive Airway Pressure Ventilation ,           Cardiovascular Exercise Tolerance: Good hypertension, (-) angina(-) Past MI and (-) DOE      Neuro/Psych PSYCHIATRIC DISORDERS  Neuromuscular disease    GI/Hepatic Neg liver ROS, hiatal hernia, GERD  Medicated and Controlled,  Endo/Other  diabetes, Type 2Hypothyroidism   Renal/GU negative Renal ROS  negative genitourinary   Musculoskeletal   Abdominal   Peds  Hematology negative hematology ROS (+)   Anesthesia Other Findings Past Medical History: No date: Anemia No date: Anxiety No date: Colon polyps No date: Constipation No date: Depressive disorder, not elsewhere classified No date: Diaphragmatic hernia without mention of obstruction or  gangrene No date: Disorders of bursae and tendons in shoulder region,  unspecified No date: Diverticulosis of colon (without mention of hemorrhage) No date: External hemorrhoid No date: Fundic gland polyps of stomach, benign No date: GERD (gastroesophageal reflux disease) No date: History of hiatal hernia No date: HTN (hypertension) No date: Hypothyroidism No date: Internal hemorrhoids without mention of complication No date: Moderate mitral regurgitation No date: Obesity, unspecified No date: OSA on CPAP     Comment:  compliant with CPAP No date: Other urinary incontinence No date: Persistent atrial fibrillation No date: Pure hypercholesterolemia No date: Type II diabetes mellitus ([place]) No date:  Urinary frequency No date: Urinary urgency No date: Vitamin B 12 deficiency  Past Surgical History: 1972: ABDOMINAL HYSTERECTOMY     Comment:  Have both ovaries 12/17/2016: ATRIAL FIBRILLATION ABLATION 12/17/2016: ATRIAL FIBRILLATION ABLATION; N/A     Comment:  Procedure: ATRIAL FIBRILLATION ABLATION;  Surgeon:               Thompson Grayer, MD;  Location: [facility];                Service: Cardiovascular;  Laterality: N/A; 1990s: CARDIAC CATHETERIZATION     Comment:  [facility]; "Dr. [name]" 05/27/2016: CARDIOVERSION; N/A     Comment:  Procedure: CARDIOVERSION;  Surgeon: [name],               MD;  Location: [facility];  Service: Cardiovascular;                Laterality: N/A; 11/25/2016: CARDIOVERSION; N/A     Comment:  Procedure: CARDIOVERSION;  Surgeon: [name],               MD;  Location: [facility];  Service: Cardiovascular;                Laterality: N/A; No date: CATARACT EXTRACTION W/ INTRAOCULAR LENS  IMPLANT, BILATERAL;  Bilateral No date: COLONOSCOPY 01/24/2016: ELECTROPHYSIOLOGIC STUDY; N/A     Comment:  Procedure: CARDIOVERSION;  Surgeon: [name],               MD;  Location: [facility];  Service: Cardiovascular;                Laterality: N/A; No date: LAPAROSCOPIC CHOLECYSTECTOMY No date: REDUCTION MAMMAPLASTY; Bilateral No date: TONSILLECTOMY  05/12/2018: TOTAL HIP ARTHROPLASTY; Right     Comment:  Procedure: TOTAL HIP ARTHROPLASTY ANTERIOR APPROACH;                Surgeon: Paralee Cancel, MD;  Location: [facility];  Service:               Orthopedics;  Laterality: Right;  70 mins     Reproductive/Obstetrics negative OB ROS                             Anesthesia Physical  Anesthesia Plan  ASA: III  Anesthesia Plan: General   Post-op Pain Management:    Induction: Intravenous  PONV Risk Score and Plan: Propofol infusion and TIVA  Airway Management Planned: Natural Airway and Nasal Cannula  Additional  Equipment:   Intra-op Plan:   Post-operative Plan:   Informed Consent: I have reviewed the patients History and Physical, chart, labs and discussed the procedure including the risks, benefits and alternatives for the proposed anesthesia with the patient or authorized representative who has indicated his/her understanding and acceptance.     Dental Advisory Given  Plan Discussed with: Anesthesiologist, CRNA and Surgeon  Anesthesia Plan Comments: (Patient consented for risks of anesthesia including but not limited to:  - adverse reactions to medications - risk of intubation if required - damage to teeth, lips or other oral mucosa - sore throat or hoarseness - Damage to heart, brain, lungs or loss of life  Patient voiced understanding.)        Anesthesia Quick Evaluation

## 2019-09-07 NOTE — Transfer of Care (Signed)
Immediate Anesthesia Transfer of Care Note  Patient: [name]  Procedure(s) Performed: CARDIOVERSION (N/A )  Patient Location: PACU  Anesthesia Type:General  Level of Consciousness: drowsy and patient cooperative  Airway & Oxygen Therapy: Patient Spontanous Breathing and Patient connected to nasal cannula oxygen  Post-op Assessment: Report given to RN and Post -op Vital signs reviewed and stable  Post vital signs: Reviewed and stable  Last Vitals:  Vitals Value Taken Time  BP 105/63 09/07/19 0742  Temp    Pulse 50 09/07/19 0743  Resp 15 09/07/19 0743  SpO2 99 % 09/07/19 0743    Last Pain:  Vitals:   09/07/19 0700  TempSrc: Oral  PainSc: 0-No pain         Complications: No complications documented.

## 2019-09-07 NOTE — Discharge Instructions (Signed)
Electrical Cardioversion Electrical cardioversion is the delivery of a jolt of electricity to restore a normal rhythm to the heart. A rhythm that is too fast or is not regular keeps the heart from pumping well. In this procedure, sticky patches or metal paddles are placed on the chest to deliver electricity to the heart from a device. This procedure may be done in an emergency if:  There is low or no blood pressure as a result of the heart rhythm.  Normal rhythm must be restored as fast as possible to protect the brain and heart from further damage.  It may save a life. This may also be a scheduled procedure for irregular or fast heart rhythms that are not immediately life-threatening. Tell a health care provider about:  Any allergies you have.  All medicines you are taking, including vitamins, herbs, eye drops, creams, and over-the-counter medicines.  Any problems you or family members have had with anesthetic medicines.  Any blood disorders you have.  Any surgeries you have had.  Any medical conditions you have.  Whether you are pregnant or may be pregnant. What are the risks? Generally, this is a safe procedure. However, problems may occur, including:  Allergic reactions to medicines.  A blood clot that breaks free and travels to other parts of your body.  The possible return of an abnormal heart rhythm within hours or days after the procedure.  Your heart stopping (cardiac arrest). This is rare. What happens before the procedure? Medicines  Your health care provider may have you start taking: ? Blood-thinning medicines (anticoagulants) so your blood does not clot as easily. ? Medicines to help stabilize your heart rate and rhythm.  Ask your health care provider about: ? Changing or stopping your regular medicines. This is especially important if you are taking diabetes medicines or blood thinners. ? Taking medicines such as aspirin and ibuprofen. These medicines can  thin your blood. Do not take these medicines unless your health care provider tells you to take them. ? Taking over-the-counter medicines, vitamins, herbs, and supplements. General instructions  Follow instructions from your health care provider about eating or drinking restrictions.  Plan to have someone take you home from the hospital or clinic.  If you will be going home right after the procedure, plan to have someone with you for 24 hours.  Ask your health care provider what steps will be taken to help prevent infection. These may include washing your skin with a germ-killing soap. What happens during the procedure?   An IV will be inserted into one of your veins.  Sticky patches (electrodes) or metal paddles may be placed on your chest.  You will be given a medicine to help you relax (sedative).  An electrical shock will be delivered. The procedure may vary among health care providers and hospitals. What can I expect after the procedure?  Your blood pressure, heart rate, breathing rate, and blood oxygen level will be monitored until you leave the hospital or clinic.  Your heart rhythm will be watched to make sure it does not change.  You may have some redness on the skin where the shocks were given. Follow these instructions at home:  Do not drive for 24 hours if you were given a sedative during your procedure.  Take over-the-counter and prescription medicines only as told by your health care provider.  Ask your health care provider how to check your pulse. Check it often.  Rest for 48 hours after the procedure or   as told by your health care provider.  Avoid or limit your caffeine use as told by your health care provider.  Keep all follow-up visits as told by your health care provider. This is important. Contact a health care provider if:  You feel like your heart is beating too quickly or your pulse is not regular.  You have a serious muscle cramp that does not go  away. Get help right away if:  You have discomfort in your chest.  You are dizzy or you feel faint.  You have trouble breathing or you are short of breath.  Your speech is slurred.  You have trouble moving an arm or leg on one side of your body.  Your fingers or toes turn cold or blue. Summary  Electrical cardioversion is the delivery of a jolt of electricity to restore a normal rhythm to the heart.  This procedure may be done right away in an emergency or may be a scheduled procedure if the condition is not an emergency.  Generally, this is a safe procedure.  After the procedure, check your pulse often as told by your health care provider. This information is not intended to replace advice given to you by your health care provider. Make sure you discuss any questions you have with your health care provider. Document Revised: 09/21/2018 Document Reviewed: 09/21/2018 Elsevier Patient Education  [place] Anesthesia, Adult, Care After This sheet gives you information about how to care for yourself after your procedure. Your health care provider may also give you more specific instructions. If you have problems or questions, contact your health care provider. What can I expect after the procedure? After the procedure, the following side effects are common:  Pain or discomfort at the IV site.  Nausea.  Vomiting.  Sore throat.  Trouble concentrating.  Feeling cold or chills.  Weak or tired.  Sleepiness and fatigue.  Soreness and body aches. These side effects can affect parts of the body that were not involved in surgery. Follow these instructions at home:  For at least 24 hours after the procedure:  Have a responsible adult stay with you. It is important to have someone help care for you until you are awake and alert.  Rest as needed.  Do not: ? Participate in activities in which you could fall or become injured. ? Drive. ? Use heavy  machinery. ? Drink alcohol. ? Take sleeping pills or medicines that cause drowsiness. ? Make important decisions or sign legal documents. ? Take care of children on your own. Eating and drinking  Follow any instructions from your health care provider about eating or drinking restrictions.  When you feel hungry, start by eating small amounts of foods that are soft and easy to digest (bland), such as toast. Gradually return to your regular diet.  Drink enough fluid to keep your urine pale yellow.  If you vomit, rehydrate by drinking water, juice, or clear broth. General instructions  If you have sleep apnea, surgery and certain medicines can increase your risk for breathing problems. Follow instructions from your health care provider about wearing your sleep device: ? Anytime you are sleeping, including during daytime naps. ? While taking prescription pain medicines, sleeping medicines, or medicines that make you drowsy.  Return to your normal activities as told by your health care provider. Ask your health care provider what activities are safe for you.  Take over-the-counter and prescription medicines only as told by your health  care provider.  If you smoke, do not smoke without supervision.  Keep all follow-up visits as told by your health care provider. This is important. Contact a health care provider if:  You have nausea or vomiting that does not get better with medicine.  You cannot eat or drink without vomiting.  You have pain that does not get better with medicine.  You are unable to pass urine.  You develop a skin rash.  You have a fever.  You have redness around your IV site that gets worse. Get help right away if:  You have difficulty breathing.  You have chest pain.  You have blood in your urine or stool, or you vomit blood. Summary  After the procedure, it is common to have a sore throat or nausea. It is also common to feel tired.  Have a responsible  adult stay with you for the first 24 hours after general anesthesia. It is important to have someone help care for you until you are awake and alert.  When you feel hungry, start by eating small amounts of foods that are soft and easy to digest (bland), such as toast. Gradually return to your regular diet.  Drink enough fluid to keep your urine pale yellow.  Return to your normal activities as told by your health care provider. Ask your health care provider what activities are safe for you. This information is not intended to replace advice given to you by your health care provider. Make sure you discuss any questions you have with your health care provider. Document Revised: 02/21/2017 Document Reviewed: 10/04/2016 Elsevier Patient Education  [place].

## 2019-09-07 NOTE — Anesthesia Postprocedure Evaluation (Signed)
Anesthesia Post Note  Patient: [name]  Procedure(s) Performed: CARDIOVERSION (N/A )  Patient location during evaluation: [facility] Recovery Anesthesia Type: General Level of consciousness: awake and alert Pain management: pain level controlled Vital Signs Assessment: post-procedure vital signs reviewed and stable Respiratory status: spontaneous breathing, nonlabored ventilation, respiratory function stable and patient connected to nasal cannula oxygen Cardiovascular status: blood pressure returned to baseline and stable Postop Assessment: no apparent nausea or vomiting Anesthetic complications: no   No complications documented.   Last Vitals:  Vitals:   09/07/19 0815 09/07/19 0830  BP: (!) 85/50 123/62  Pulse: (!) 56 (!) 51  Resp: 15 10  Temp:    SpO2: 100% 100%    Last Pain:  Vitals:   09/07/19 0830  TempSrc:   PainSc: 0-No pain                 Martha Clan

## 2019-09-07 NOTE — Interval H&P Note (Signed)
History and Physical Interval Note:  09/07/2019 7:38 AM  [name]  has presented today for surgery, with the diagnosis of atrial fibrillation.  The various methods of treatment have been discussed with the patient and family. After consideration of risks, benefits and other options for treatment, the patient has consented to  Procedure(s): CARDIOVERSION (N/A) as a surgical intervention.  The patient's history has been reviewed, patient examined, no change in status, stable for surgery.  I have reviewed the patient's chart and labs.  Questions were answered to the patient's satisfaction.     Alycea Segoviano

## 2019-09-07 NOTE — CV Procedure (Signed)
    Cardioversion Note  [name] [id number] 07-May-1945  Procedure: DC Cardioversion Indications: Persistent atrial fibrillation  Procedure Details Consent: Obtained Time Out: Verified patient identification, verified procedure, site/side was marked, verified correct patient position, special equipment/implants available, Radiology Safety Procedures followed,  medications/allergies/relevent history reviewed, required imaging and test results available.  Performed  The patient has been on adequate anticoagulation.  The patient received IV propofol by anesthesia services for sedation.  Synchronous cardioversion was performed at 200 joules x 1.  The cardioversion was successful with restoration of sinus bradycardia.  Complications: No apparent complications Patient did tolerate procedure well.  Nelva Bush., MD 09/07/2019, 7:39 AM

## 2019-09-08 ENCOUNTER — Encounter: Payer: Self-pay | Admitting: Internal Medicine

## 2019-09-13 NOTE — Progress Notes (Signed)
Cardiology Office Note    Date:  09/17/2019   ID:  [name], DOB 1945/09/14, MRN [id number]  PCP:  Jinny Sanders, MD  Cardiologist:  Kathlyn Sacramento, MD  Electrophysiologist:  Thompson Grayer, MD   Chief Complaint: Follow up  History of Present Illness:   [name] is a 74 y.o. female with history of persistent A. fib on Eliquis status post cardioversion in 01/2016,05/2016, and 9/2018status post radiofrequency catheter ablation on 12/17/2016 s/p repeat DCCV 09/07/2019 followed by EP/A. fib clinic,atrial tachycardia s/p DCCV 09/18/2018,pulmonary hypertension, mitral valve prolapse with mitral regurgitation, DM2, COVID-19 in 02/2019 hypertension, hyperlipidemia, hypothyroidism, anemia, obesity, OSA on CPAP, diverticulosis, GERD, and anxiety who presents for follow-up ofher A. Fib following most recent DCCV.  Prior cardiac cath in 12/2008 for atypical chest pain showed no significant CAD with mildly elevated LVEDP and normal EF.She was initially diagnosed with A. fib in 09/2013 and was quite symptomatic with this. She was started on metoprolol for rate control. Echo at that time showed normal LV systolic function with mild mitral regurgitation, moderately dilated left atrium, and mild to moderate pulmonary hypertension. Given it was felt she would likely need antiarrhythmic therapy due to symptoms, nuclear stress test in 09/2013 was undertaken and was normal. With recurrence of A. fib, she was subsequently placed on flecainide and underwent cardioversion in 01/2016. She had recurrent A. fib in 05/2016 and underwent repeat successful cardioversion at that time. She has subsequently been referred to the A. fib clinic. She had recurrent A. fib in 11/2016 and underwent a third cardioversion at that time. She then underwent radiofrequency catheter ablation on 12/17/2016.  She was seen on 09/15/2018 notinga 4 to 5-day history of mild palpitations that feltdifferent than her prior episodes of  A. fib along with some brief episodes of dizziness. After review with EP, she was noted to be in atrial tachycardia with long cycle Mobitz type I AV block. Her flecainide was increased to 50 mg bid and she underwent successful DCCV on 09/18/2018.  She was diagnosed with COVID-19 in 02/2019, not requiring hospital admission and received infusion.  In follow up with the Afib clinic in 04/2019, she was maintaining sinus rhythm.  Echo in 04/2019 showed an EF of 55 to 60%, mildly dilated left ventricular cavity, no regional wall motion abnormalities, normal RV systolic function and ventricular cavity size, severely dilated left atrium measuring 55 mm, mild mitral regurgitation, mild aortic valve sclerosis without evidence of stenosis.  She had recurrent Afib in 07/2019, and was scheduled for DCCV.  She converted to sinus rhythm prior to this being completed.  At this time, her flecainide was titrated to 100 mg bid.  She was most recently seen by Dr. Fletcher Anon on 09/02/2019, and was back in symptomatic Afib with noted dizziness, fatigue, and a syncopal episode.  Her diltiazem was changed to metoprolol and she subsequently underwent successful DCCV on 09/07/2019 to sinus bradycardia.  Her moderate mitral regurgitation was not felt to be driving her Afib.  With her severely dilated left atrium, it was felt it may be difficult to keep her in sinus rhythm.  She has been re-referred to EP in this setting.   She comes in doing very well today.  She EKG has not felt this well in several months.  She is tolerating the transition from diltiazem to metoprolol without issues.  No symptoms consistent with recurrence of arrhythmia.  No further syncopal episodes.  No chest pain, dyspnea, palpitations, dizziness, lower extremity  swelling, or orthopnea.  No falls, hematochezia, or melena.  She follows back up with EP next month.  She does not have any issues or concerns at this time.   Labs independently reviewed: 09/2019 - HGB 11.7, PLT 339,  BUN 15, SCr 0.99, potassium 5.1 08/2019 - TC 145, TG 52, HDL 49, LDL 85, albumin 4.1, AST/ALT normal 07/2019 - TSH normal, magnesium 1.4 05/2019 - A1c 6.2  Past Medical History:  Diagnosis Date  . Anemia   . Anxiety   . Atrial fibrillation ([place])   . Colon polyps   . Constipation   . Depressive disorder, not elsewhere classified   . Diabetes mellitus without complication ([place])   . Diaphragmatic hernia without mention of obstruction or gangrene   . Disorders of bursae and tendons in shoulder region, unspecified   . Diverticulosis of colon (without mention of hemorrhage)   . External hemorrhoid   . Fundic gland polyps of stomach, benign   . GERD (gastroesophageal reflux disease)   . History of hiatal hernia   . HTN (hypertension)   . Hypertension   . Hypothyroidism   . Internal hemorrhoids without mention of complication   . Moderate mitral regurgitation   . Obesity, unspecified   . OSA on CPAP    compliant with CPAP  . Other urinary incontinence   . Persistent atrial fibrillation ([place])   . Pure hypercholesterolemia   . Type II diabetes mellitus ([place])   . Urinary frequency   . Urinary urgency   . Vitamin B 12 deficiency     Past Surgical History:  Procedure Laterality Date  . ABDOMINAL HYSTERECTOMY    . ABDOMINAL HYSTERECTOMY  1972   Have both ovaries  . ATRIAL FIBRILLATION ABLATION  12/17/2016  . ATRIAL FIBRILLATION ABLATION N/A 12/17/2016   Procedure: ATRIAL FIBRILLATION ABLATION;  Surgeon: Thompson Grayer, MD;  Location: [facility];  Service: Cardiovascular;  Laterality: N/A;  . [place]   [facility]; "Dr. [name]"  . CARDIOVERSION N/A 05/27/2016   Procedure: CARDIOVERSION;  Surgeon: [name], MD;  Location: [facility];  Service: Cardiovascular;  Laterality: N/A;  . CARDIOVERSION N/A 11/25/2016   Procedure: CARDIOVERSION;  Surgeon: [name], MD;  Location: [facility];  Service: Cardiovascular;  Laterality: N/A;  . CARDIOVERSION N/A  09/18/2018   Procedure: CARDIOVERSION;  Surgeon: [name], MD;  Location: [facility];  Service: Cardiovascular;  Laterality: N/A;  . CARDIOVERSION N/A 07/28/2019   Procedure: CARDIOVERSION;  Surgeon: Kate Sable, MD;  Location: [facility];  Service: Cardiovascular;  Laterality: N/A;  . CARDIOVERSION N/A 09/07/2019   Procedure: CARDIOVERSION;  Surgeon: Nelva Bush, MD;  Location: [facility];  Service: Cardiovascular;  Laterality: N/A;  . CATARACT EXTRACTION W/ INTRAOCULAR LENS  IMPLANT, BILATERAL Bilateral   . COLONOSCOPY    . ELECTROPHYSIOLOGIC STUDY N/A 01/24/2016   Procedure: CARDIOVERSION;  Surgeon: [name], MD;  Location: [facility];  Service: Cardiovascular;  Laterality: N/A;  . LAPAROSCOPIC CHOLECYSTECTOMY    . REDUCTION MAMMAPLASTY Bilateral   . TONSILLECTOMY    . TOTAL HIP ARTHROPLASTY Right 05/12/2018   Procedure: TOTAL HIP ARTHROPLASTY ANTERIOR APPROACH;  Surgeon: Paralee Cancel, MD;  Location: [facility];  Service: Orthopedics;  Laterality: Right;  70 mins    Current Medications: Current Meds  Medication Sig  . atorvastatin (LIPITOR) 10 MG tablet TAKE 1 TABLET BY MOUTH EVERYDAY AT BEDTIME (Patient taking differently: Take 10 mg by mouth at bedtime. )  . calcium carbonate (OS-CAL -  DOSED IN MG OF ELEMENTAL CALCIUM) 1250 (500 Ca) MG tablet Take 1 tablet by mouth daily with breakfast.  . ELIQUIS 5 MG TABS tablet Take 5 mg by mouth 2 (two) times daily.  [name] escitalopram (LEXAPRO) 10 MG tablet Take 10 mg by mouth daily.  . flecainide (TAMBOCOR) 100 MG tablet Take 1 tablet (100 mg total) by mouth 2 (two) times daily.  . hydroxypropyl methylcellulose / hypromellose (ISOPTO TEARS / GONIOVISC) 2.5 % ophthalmic solution Place 1 drop into both eyes 2 (two) times daily as needed for dry eyes.  . metFORMIN (GLUCOPHAGE-XR) 500 MG 24 hr tablet TAKE 2 TABLETS (1,000 MG TOTAL) BY MOUTH 2 (TWO) TIMES DAILY WITH A MEAL.  . metoprolol tartrate (LOPRESSOR) 25 MG tablet Take 0.5 tablets  (12.5 mg total) by mouth 2 (two) times daily.  Glory Rosebush ULTRA test strip CHECK BLOOD SUGAR 2 TIMES A DAY AS DIRECTED DX CODE E11.9  . solifenacin (VESICARE) 10 MG tablet Take 10 mg by mouth daily.  . TRADJENTA 5 MG TABS tablet Take 5 mg by mouth daily.  . traZODone (DESYREL) 50 MG tablet Take 1 tablet (50 mg total) by mouth at bedtime as needed for sleep.    Allergies:   Cortisone, Pravachol [pravastatin sodium], and Contrast media [iodinated diagnostic agents]   Social History   Socioeconomic History  . Marital status: Married    Spouse name: Laverna Peace  . Number of children: 2  . Years of education: Not on file  . Highest education level: Not on file  Occupational History  . Occupation: retired    Fish farm manager: UNEMPLOYED  Tobacco Use  . Smoking status: Never Smoker  . Smokeless tobacco: Never Used  Vaping Use  . Vaping Use: Never used  Substance and Sexual Activity  . Alcohol use: No    Alcohol/week: 0.0 standard drinks  . Drug use: No  . Sexual activity: Yes  Other Topics Concern  . Not on file  Social History Narrative   ** Merged History Encounter **       Exercise walks 3 times daily Lives with spouse in [place] - retired from employment 1 son and 1 daughter  Diet fruit, occ fast food, salads and lean meat  3 tea/day, no EtOH, no tobacco, no drugs    Social Determinants of Health   Financial Resource Strain: Low Risk   . Difficulty of Paying Living Expenses: Not hard at all  Food Insecurity: No Food Insecurity  . Worried About Charity fundraiser in the Last Year: Never true  . Ran Out of Food in the Last Year: Never true  Transportation Needs: No Transportation Needs  . Lack of Transportation (Medical): No  . Lack of Transportation (Non-Medical): No  Physical Activity: Inactive  . Days of Exercise per Week: 0 days  . Minutes of Exercise per Session: 0 min  Stress: Stress Concern Present  . Feeling of Stress : To some extent  Social  Connections: Unknown  . Frequency of Communication with Friends and Family: More than three times a week  . Frequency of Social Gatherings with Friends and Family: Not on file  . Attends Religious Services: Not on file  . Active Member of Clubs or Organizations: Not on file  . Attends Archivist Meetings: Not on file  . Marital Status: Not on file     Family History:  The patient's family history includes Cancer in her mother; Dementia in her mother; Diabetes in her brother and mother;  Hyperlipidemia in her father and mother; Hypertension in her sister; Lung cancer in her father; Pulmonary embolism in her mother; Stroke in her paternal grandfather and paternal grandmother. There is no history of Colon cancer or Esophageal cancer.  ROS:   Review of Systems  Constitutional: Negative for chills, diaphoresis, fever and weight loss.  HENT: Negative for congestion.   Eyes: Negative for discharge and redness.  Respiratory: Negative for cough, sputum production, shortness of breath and wheezing.   Cardiovascular: Negative for chest pain, palpitations, orthopnea, claudication, leg swelling and PND.  Gastrointestinal: Negative for abdominal pain, blood in stool, heartburn, melena, nausea and vomiting.  Musculoskeletal: Negative for falls and myalgias.  Skin: Negative for rash.  Neurological: Negative for dizziness, tingling, tremors, sensory change, speech change, focal weakness, loss of consciousness and weakness.  Endo/Heme/Allergies: Does not bruise/bleed easily.  Psychiatric/Behavioral: Negative for substance abuse. The patient is not nervous/anxious.   All other systems reviewed and are negative.    EKGs/Labs/Other Studies Reviewed:    Studies reviewed were summarized above. The additional studies were reviewed today:  2D echo 04/2019: 1. Normal LV systolic function; mild LVE; mild eccentric MR; severe LAE.  2. Left ventricular ejection fraction, by estimation, is 55 to 60%.  The  left ventricle has normal function. The left ventrical has no regional  wall motion abnormalities. The left ventricular internal cavity size was  mildly dilated. Left ventricular  diastolic parameters are indeterminate.  3. Right ventricular systolic function is normal. The right ventricular  size is normal.  4. Left atrial size was severely dilated.  5. The mitral valve is normal in structure and function. Mild mitral  valve regurgitation. No evidence of mitral stenosis.  6. The aortic valve is tricuspid. Aortic valve regurgitation is not  visualized. Mild aortic valve sclerosis is present, with no evidence of  aortic valve stenosis.  7. The inferior vena cava is normal in size with greater than 50%  respiratory variability, suggesting right atrial pressure of 3 mmHg.  EKG:  EKG is ordered today.  The EKG ordered today demonstrates sinus bradycardia with rare PAC, 54 bpm, no acute ST-T changes  Recent Labs: 07/26/2019: B Natriuretic Peptide 235.5; Magnesium 1.4; TSH 1.728 08/27/2019: ALT 10 09/02/2019: BUN 15; Creatinine, Ser 0.99; Hemoglobin 11.7; Platelets 339; Potassium 5.1; Sodium 133  Recent Lipid Panel    Component Value Date/Time   CHOL 145 08/27/2019 0833   CHOL 147 11/20/2016 0919   TRIG 52.0 08/27/2019 0833   HDL 49.10 08/27/2019 0833   HDL 47 11/20/2016 0919   CHOLHDL 3 08/27/2019 0833   VLDL 10.4 08/27/2019 0833   LDLCALC 85 08/27/2019 0833   LDLCALC 89 11/20/2016 0919   LDLDIRECT 163.1 07/04/2011 0922    PHYSICAL EXAM:    VS:  BP 140/60 (BP Location: Left Arm, Patient Position: Sitting, Cuff Size: Normal)   Pulse (!) 54   Ht 5\' 5"  (1.651 m)   Wt 175 lb 2 oz (79.4 kg)   LMP  (LMP Unknown)   SpO2 98%   BMI 29.14 kg/m   BMI: Body mass index is 29.14 kg/m.  Physical Exam Constitutional:      Appearance: She is well-developed.  HENT:     Head: Normocephalic and atraumatic.  Eyes:     General:        Right eye: No discharge.        Left eye: No  discharge.  Neck:     Vascular: No JVD.  Cardiovascular:  Rate and Rhythm: Regular rhythm. Bradycardia present.     Pulses: No midsystolic click and no opening snap.          Dorsalis pedis pulses are 2+ on the right side and 2+ on the left side.       Posterior tibial pulses are 2+ on the right side and 2+ on the left side.     Heart sounds: S1 normal and S2 normal. Heart sounds not distant. Murmur heard. High-pitched blowing holosystolic murmur is present with a grade of 2/6 at the apex.  No friction rub.  Pulmonary:     Effort: Pulmonary effort is normal. No respiratory distress.     Breath sounds: Normal breath sounds. No decreased breath sounds, wheezing or rales.  Chest:     Chest wall: No tenderness.  Abdominal:     General: There is no distension.     Palpations: Abdomen is soft.     Tenderness: There is no abdominal tenderness.  Musculoskeletal:     Cervical back: Normal range of motion.  Skin:    General: Skin is warm and dry.     Nails: There is no clubbing.  Neurological:     Mental Status: She is alert and oriented to person, place, and time.  Psychiatric:        Speech: Speech normal.        Behavior: Behavior normal.        Thought Content: Thought content normal.        Judgment: Judgment normal.     Wt Readings from Last 3 Encounters:  09/17/19 175 lb 2 oz (79.4 kg)  09/16/19 175 lb 9.6 oz (79.7 kg)  09/07/19 173 lb 1 oz (78.5 kg)     ASSESSMENT & PLAN:   1. Persistent A. fib/atrial tachycardia: She is maintaining sinus rhythm status post most recent cardioversion.  She will follow-up with EP next month for further recommendations.  Continue flecainide 100 mg twice daily, Lopressor 25 mg twice daily, Eliquis 5 mg twice daily.  No symptoms concerning for bleeding.  Recent CBC demonstrated stable hemoglobin.  Repeat BMP and magnesium.  2. Mitral regurgitation: Mild to moderate by most recent echo.  Continue to monitor clinically.  Not felt to be driving  her A. fib.  3. HTN: Blood pressure is well controlled today.  Continue Lopressor 12.5 mg twice daily.  4. HLD: LDL of 98 from 01/2019 with normal LFT at that time.  Remains on atorvastatin.  5. Sleep apnea: Continue CPAP.  Disposition: F/u with Dr. Fletcher Anon in 12/2019, and EP next month.   Medication Adjustments/Labs and Tests Ordered: Current medicines are reviewed at length with the patient today.  Concerns regarding medicines are outlined above. Medication changes, Labs and Tests ordered today are summarized above and listed in the Patient Instructions accessible in Encounters.   Signed, Christell Faith, PA-C 09/17/2019 9:47 AM     [place] [address] [place] [address] [place], [place] [address] [phone]

## 2019-09-16 ENCOUNTER — Encounter: Payer: Self-pay | Admitting: Pulmonary Disease

## 2019-09-16 ENCOUNTER — Other Ambulatory Visit: Payer: Self-pay

## 2019-09-16 ENCOUNTER — Ambulatory Visit: Payer: Medicare HMO | Admitting: Pulmonary Disease

## 2019-09-16 VITALS — BP 128/72 | HR 62 | Temp 97.1°F | Ht 65.0 in | Wt 175.6 lb

## 2019-09-16 DIAGNOSIS — G4733 Obstructive sleep apnea (adult) (pediatric): Secondary | ICD-10-CM

## 2019-09-16 DIAGNOSIS — G4730 Sleep apnea, unspecified: Secondary | ICD-10-CM | POA: Diagnosis not present

## 2019-09-16 DIAGNOSIS — G4700 Insomnia, unspecified: Secondary | ICD-10-CM

## 2019-09-16 MED ORDER — TRAZODONE HCL 50 MG PO TABS
50.0000 mg | ORAL_TABLET | Freq: Every evening | ORAL | 3 refills | Status: DC | PRN
Start: 2019-09-16 — End: 2019-12-30

## 2019-09-16 NOTE — Patient Instructions (Signed)
Trazodone 50 mg pill nightly as needed to help with sleep  Will arrange for home sleep study  Will call to arrange for follow up after sleep study reviewed

## 2019-09-16 NOTE — Progress Notes (Signed)
[facility]  Chief Complaint  Patient presents with  . Follow-up    not currently wearing cpap, as she does not notice a difference when wearing it.     Constitutional:  BP 128/72 (BP Location: Left Arm, Cuff Size: Normal)   Pulse 62   Temp (!) 97.1 F (36.2 C) (Temporal)   Ht 5\' 5"  (1.651 m)   Wt 175 lb 9.6 oz (79.7 kg)   LMP  (LMP Unknown)   SpO2 98%   BMI 29.22 kg/m   Past Medical History:  Persistent A fib, MVP with MR, HLD, HTN, Anxiety, Colon polyps, Depression, DM, Diverticulosis, GERD, HH, Hypothyroidism, B12 deficiency, COVID 19 infection December 2020  Summary:  [name] is a 74 y.o. female with objective sleep apnea.  Subjective:   She was previously seen by Dr. Alva Garnet.    She had sleep study in 2010 at [facility] and showed severe OSA.  She uses [employer] for her DME.  She has been under a lot of stress at home due to illness in the family.  As a result she hasn't been able to keep up with using CPAP.  When she was using she noticed a leak from her machine, but could never figure out where the leak was coming from.  Her machine is more than 72 yrs old.  She does have trouble falling asleep related to stress and used trazodone previously which helped.    She had COVID infection in December 2020.  She was treated with bamlanivimab  Chest xray from 07/26/19 was normal.  She has lost about 20 lbs over the past 6 months.   Physical Exam:   Appearance - well kempt  ENMT - no sinus tenderness, no nasal discharge, no oral exudate, Mallampati 3, narrow jaw, over bite  Respiratory - no wheeze, or rales  CV - regular rate and rhythm, no murmurs  GI - soft, non tender  Lymph - no adenopathy noted in neck  Ext - no edema  Skin - no rashes  Neuro - normal strength, oriented x 3  Psych - normal mood and affect   Assessment/Plan:   Obstructive sleep apnea. - discussed how untreated sleep apnea can impact her cardiovascular  health, particularly in relation to her history of difficult to control atrial fibrillation - reviewed different treatment options for sleep apnea - will arrange for home sleep study to assess current status of sleep apnea - if she is to resume CPAP, then she would need new equipment since her current CPAP is quite old and seems to be malfunctioning  Insomnia with sleep apnea. - trazodone 50 mg qhs prn   A total of  32 minutes spent addressing patient care issues on day of visit.  Follow up:  Patient Instructions  Trazodone 50 mg pill nightly as needed to help with sleep  Will arrange for home sleep study  Will call to arrange for follow up after sleep study reviewed    Signature:  Chesley Mires, MD [place] Pager: [phone] 09/16/2019, 10:11 AM  Flow Sheet    Sleep tests:   PSG 09/14/08 [facility]) >> AHI 32, SpO2 86%  Cardiac tests:   Echo 04/15/19 >> EF 55 to 60%, mild MR, severe LA dilation  Medications:   Allergies as of 09/16/2019      Reactions   Cortisone Shortness Of Breath, Rash   Pravachol [pravastatin Sodium]    Leg pain   Contrast Media [iodinated Diagnostic  Agents] Rash      Medication List       Accurate as of September 16, 2019 10:11 AM. If you have any questions, ask your nurse or doctor.        STOP taking these medications   acetaminophen 500 MG tablet Commonly known as: TYLENOL Stopped by: Chesley Mires, MD   amoxicillin 500 MG capsule Commonly known as: AMOXIL Stopped by: Chesley Mires, MD     TAKE these medications   atorvastatin 10 MG tablet Commonly known as: LIPITOR TAKE 1 TABLET BY MOUTH EVERYDAY AT BEDTIME What changed: See the new instructions.   calcium carbonate 1250 (500 Ca) MG tablet Commonly known as: OS-CAL - dosed in mg of elemental calcium Take 1 tablet by mouth daily with breakfast.   Eliquis 5 MG Tabs tablet Generic drug: apixaban Take 5 mg by mouth 2 (two) times daily.   escitalopram 10 MG  tablet Commonly known as: LEXAPRO Take 10 mg by mouth daily.   flecainide 100 MG tablet Commonly known as: TAMBOCOR Take 1 tablet (100 mg total) by mouth 2 (two) times daily.   hydroxypropyl methylcellulose / hypromellose 2.5 % ophthalmic solution Commonly known as: ISOPTO TEARS / GONIOVISC Place 1 drop into both eyes 2 (two) times daily as needed for dry eyes.   metFORMIN 500 MG 24 hr tablet Commonly known as: GLUCOPHAGE-XR TAKE 2 TABLETS (1,000 MG TOTAL) BY MOUTH 2 (TWO) TIMES DAILY WITH A MEAL.   metoprolol tartrate 25 MG tablet Commonly known as: LOPRESSOR Take 0.5 tablets (12.5 mg total) by mouth 2 (two) times daily.   OneTouch Ultra test strip Generic drug: glucose blood CHECK BLOOD SUGAR 2 TIMES A DAY AS DIRECTED DX CODE E11.9   solifenacin 10 MG tablet Commonly known as: VESICARE Take 10 mg by mouth daily.   Tradjenta 5 MG Tabs tablet Generic drug: linagliptin Take 5 mg by mouth daily.   traZODone 50 MG tablet Commonly known as: DESYREL Take 1 tablet (50 mg total) by mouth at bedtime as needed for sleep. Started by: Chesley Mires, MD       Past Surgical History:  She  has a past surgical history that includes Abdominal hysterectomy; Tonsillectomy; Abdominal hysterectomy (1972); Cardiac catheterization (N/A, 01/24/2016); CARDIOVERSION (N/A, 05/27/2016); CARDIOVERSION (N/A, 11/25/2016); ATRIAL FIBRILLATION ABLATION (12/17/2016); Laparoscopic cholecystectomy; Reduction mammaplasty (Bilateral); Cataract extraction w/ intraocular lens  implant, bilateral (Bilateral); Cardiac catheterization (1990s); ATRIAL FIBRILLATION ABLATION (N/A, 12/17/2016); Colonoscopy; Total hip arthroplasty (Right, 05/12/2018); Cardioversion (N/A, 09/18/2018); Cardioversion (N/A, 07/28/2019); and Cardioversion (N/A, 09/07/2019).  Family History:  Her family history includes Cancer in her mother; Dementia in her mother; Diabetes in her brother and mother; Hyperlipidemia in her father and mother;  Hypertension in her sister; Lung cancer in her father; Pulmonary embolism in her mother; Stroke in her paternal grandfather and paternal grandmother.  Social History:  She  reports that she has never smoked. She has never used smokeless tobacco. She reports that she does not drink alcohol and does not use drugs.

## 2019-09-17 ENCOUNTER — Ambulatory Visit (INDEPENDENT_AMBULATORY_CARE_PROVIDER_SITE_OTHER): Payer: Medicare HMO | Admitting: Physician Assistant

## 2019-09-17 ENCOUNTER — Encounter: Payer: Self-pay | Admitting: Physician Assistant

## 2019-09-17 ENCOUNTER — Other Ambulatory Visit: Payer: Self-pay

## 2019-09-17 VITALS — BP 140/60 | HR 54 | Ht 65.0 in | Wt 175.1 lb

## 2019-09-17 DIAGNOSIS — E782 Mixed hyperlipidemia: Secondary | ICD-10-CM | POA: Diagnosis not present

## 2019-09-17 DIAGNOSIS — G4733 Obstructive sleep apnea (adult) (pediatric): Secondary | ICD-10-CM | POA: Diagnosis not present

## 2019-09-17 DIAGNOSIS — I10 Essential (primary) hypertension: Secondary | ICD-10-CM

## 2019-09-17 DIAGNOSIS — I4819 Other persistent atrial fibrillation: Secondary | ICD-10-CM | POA: Diagnosis not present

## 2019-09-17 DIAGNOSIS — I471 Supraventricular tachycardia: Secondary | ICD-10-CM

## 2019-09-17 DIAGNOSIS — I340 Nonrheumatic mitral (valve) insufficiency: Secondary | ICD-10-CM

## 2019-09-17 NOTE — Patient Instructions (Signed)
Medication Instructions:  Your physician recommends that you continue on your current medications as directed. Please refer to the Current Medication list given to you today.  *If you need a refill on your cardiac medications before your next appointment, please call your pharmacy*   Lab Work: Your physician recommends that you have labs drawn today. If you have labs (blood work) drawn today and your tests are completely normal, you will receive your results only by: [name] MyChart Message (if you have MyChart) OR . A paper copy in the mail If you have any lab test that is abnormal or we need to change your treatment, we will call you to review the results.   Testing/Procedures: None Ordered.   Follow-Up: At [facility], you and your health needs are our priority.  As part of our continuing mission to provide you with exceptional heart care, we have created designated Provider Care Teams.  These Care Teams include your primary Cardiologist (physician) and Advanced Practice Providers (APPs -  Physician Assistants and Nurse Practitioners) who all work together to provide you with the care you need, when you need it.  We recommend signing up for the patient portal called "MyChart".  Sign up information is provided on this After Visit Summary.  MyChart is used to connect with patients for Virtual Visits (Telemedicine).  Patients are able to view lab/test results, encounter notes, upcoming appointments, etc.  Non-urgent messages can be sent to your provider as well.   To learn more about what you can do with MyChart, go to [email or web address].    Your next appointment:   Follow up with your already scheduled appointments   The format for your next appointment:   N/A  Provider:   N/A   Other Instructions N/A

## 2019-09-18 LAB — BASIC METABOLIC PANEL
BUN/Creatinine Ratio: 13 (ref 12–28)
BUN: 13 mg/dL (ref 8–27)
CO2: 24 mmol/L (ref 20–29)
Calcium: 8.9 mg/dL (ref 8.7–10.3)
Chloride: 101 mmol/L (ref 96–106)
Creatinine, Ser: 0.97 mg/dL (ref 0.57–1.00)
GFR calc Af Amer: 67 mL/min/{1.73_m2} (ref >59)
GFR calc non Af Amer: 59 mL/min/{1.73_m2} — ABNORMAL LOW (ref >59)
Glucose: 163 mg/dL — ABNORMAL HIGH (ref 65–99)
Potassium: 5.2 mmol/L (ref 3.5–5.2)
Sodium: 137 mmol/L (ref 134–144)

## 2019-09-18 LAB — MAGNESIUM: Magnesium: 1.8 mg/dL (ref 1.6–2.3)

## 2019-09-20 ENCOUNTER — Telehealth: Payer: Self-pay

## 2019-09-20 NOTE — Telephone Encounter (Signed)
-----   Message from Rise Mu, PA-C sent at 09/19/2019  2:38 PM EDT ----- Random glucose is mildly elevated with known diabetes.  Kidney function is normal.  Potassium is high normal. Magnesium is improved and normal.   Recommendations: Please make sure she is not using salt substitutes or seasonings that contain potassium.

## 2019-09-20 NOTE — Telephone Encounter (Signed)
Call to patient to review labs.    Pt verbalized understanding and has no further questions at this time. She confirmed that she is not taking salt substitutes or using spices high in K that she is aware of.    Advised pt to call for any further questions or concerns.  No further orders.

## 2019-09-27 ENCOUNTER — Telehealth: Payer: Self-pay | Admitting: Cardiology

## 2019-09-27 NOTE — Telephone Encounter (Signed)
Pt c/o medication issue:  1. Name of Medication: metoprolol tartrate and flecainide   2. How are you currently taking this medication (dosage and times per day)? 25mg  0.5 tablets by mouth 2 times daily and 100mg  1 tablet by mouth 2 times daily   3. Are you having a reaction (difficulty breathing--STAT)? Dizziness  4. What is your medication issue? Patient calling.  States that since starting medications she has been getting real dizzy and her BP has been fluctuating.  Would like to discuss   Current BP readings  132/83 HR 54 153/72 HR 49 134/62 HR 67

## 2019-09-28 ENCOUNTER — Other Ambulatory Visit: Payer: Self-pay

## 2019-09-28 ENCOUNTER — Ambulatory Visit: Payer: Medicare HMO

## 2019-09-28 DIAGNOSIS — G4733 Obstructive sleep apnea (adult) (pediatric): Secondary | ICD-10-CM | POA: Diagnosis not present

## 2019-09-28 NOTE — Telephone Encounter (Signed)
She was doing very well at her last appointment with similar BP and heart rate. We did not make any medication changes at that time. Flecainide can cause some dizziness. Please have her come in for an appointment with me this Friday. I have an opening at 10 AM.

## 2019-09-28 NOTE — Progress Notes (Signed)
Cardiology Office Note    Date:  10/01/2019   ID:  [name], DOB Jul 01, 1945, MRN [id number]  PCP:  Jinny Sanders, MD  Cardiologist:  Kathlyn Sacramento, MD  Electrophysiologist:  Thompson Grayer, MD   Chief Complaint: Dizziness   History of Present Illness:   [name] is a 74 y.o. female with history of persistent A. fib on Eliquis status post cardioversion in 01/2016,05/2016, and 9/2018status post radiofrequency catheter ablation on 12/17/2016 s/p repeat DCCV 09/07/2019 followed by EP/A. fib clinic,atrial tachycardia s/p DCCV 09/18/2018,pulmonary hypertension, mitral valve prolapse with mitral regurgitation, DM2,COVID-19 in 02/2019,hypertension, hyperlipidemia, hypothyroidism, anemia, obesity, OSA on CPAP, diverticulosis, GERD, and anxiety who presents for evaluation of dizziness.  Prior cardiac cath in 12/2008 for atypical chest pain showed no significant CAD with mildly elevated LVEDP and normal EF.She was initially diagnosed with A. fib in 09/2013 and was quite symptomatic with this. She was started on metoprolol for rate control. Echo at that time showed normal LV systolic function withmild mitral regurgitation, moderately dilated left atrium, and mild to moderate pulmonary hypertension. Given it was felt she would likely need antiarrhythmic therapy due to symptoms,nuclear stress test in 09/2013 was undertaken and was normal. With recurrence of A. fib, she was subsequently placed on flecainideand underwent cardioversion in 01/2016. She had recurrent A. fib in 05/2016 and underwent repeat successful cardioversion at that time. She has subsequently been referred to the A. fib clinic. She had recurrent A. fib in 11/2016 and underwent a third cardioversion at that time. Shethenunderwent radiofrequency catheter ablation on 12/17/2016.  She was seen on 09/15/2018 notinga 4 to 5-day history of mild palpitations that feltdifferent than her prior episodes of A. fibalong with  somebrief episodesof dizziness. After review with EP, she was noted to be in atrial tachycardia with long cycle Mobitz type I AV block. Her flecainide was increased to 50 mg bid and she underwent successful DCCV on 09/18/2018.  She was diagnosed with COVID-19 in 02/2019, not requiring hospital admissionand received infusion.  In follow up with the Afib clinic in 04/2019, she was maintaining sinus rhythm.  Echo in 04/2019 showed an EF of 55 to 60%, mildly dilated left ventricular cavity, no regional wall motion abnormalities, normal RV systolic function and ventricular cavity size, severely dilated left atrium measuring 55 mm, mild mitral regurgitation, mild aortic valve sclerosis without evidence of stenosis.  She had recurrent Afib in 07/2019, and was scheduled for DCCV.  She converted to sinus rhythm prior to this being completed.  At that time, her flecainide was titrated to 100 mg bid.  She was seen by Dr. Fletcher Anon on 09/02/2019, and was back in symptomatic Afib with noted dizziness, fatigue, and a syncopal episode.  Her diltiazem was changed to metoprolol and she subsequently underwent successful DCCV on 09/07/2019 to sinus bradycardia.  Her moderate mitral regurgitation was not felt to be driving her Afib.  With her severely dilated left atrium, it was felt it may be difficult to keep her in sinus rhythm.  She has been re-referred to EP in this setting.  She was seen in follow up on 09/17/2019 and was doing well, and maintaining sinus rhythm. She was mildly bradycardic with a heart rate of 54 bpm, though was asymptomatic.  BP was 140/60.  No changes were made at that time.   She called our office on 7/26 noting positional dizziness when bending over from a standing position as well as dizziness while standing outdoors with her husband. BP  was stable with readings ranging from 132-153/49-67 with heart rates ranging from 49-67 bpm.   She comes in today accompanied by her husband and since she was last seen but notes  intermittent randomly occurring dizziness that will typically last several seconds.  This is not described as a vertigo-like sensation.  No associated palpitations, chest pain, dyspnea, presyncope, or syncope.  She last had an episode of dizziness during the afternoon on 7/29 while walking up the steps causing her to fall.  She did not hit her head or suffer LOC.  Since she was last seen her BP has fluctuated between the low 409W to 119J systolic predominantly with a rare reading last evening of 92/59 prior to taking her p.m. metoprolol.  She did not take her flecainide last evening and in the setting has not had any further dizziness.  No hematochezia or melena.   Labs independently reviewed: 09/2019 - HGB 11.7, PLT 339, BUN 13, SCr 0.97, potassium 5.2, magnesium 1.8 08/2019 - TC 145, TG 52, HDL 49, LDL 85, albumin 4.1, AST/ALT normal 07/2019 - TSH normal 05/2019 - A1c 6.2  Past Medical History:  Diagnosis Date  . Anemia   . Anxiety   . Atrial fibrillation ([place])   . Colon polyps   . Constipation   . Depressive disorder, not elsewhere classified   . Diabetes mellitus without complication ([place])   . Diaphragmatic hernia without mention of obstruction or gangrene   . Disorders of bursae and tendons in shoulder region, unspecified   . Diverticulosis of colon (without mention of hemorrhage)   . External hemorrhoid   . Fundic gland polyps of stomach, benign   . GERD (gastroesophageal reflux disease)   . History of hiatal hernia   . HTN (hypertension)   . Hypertension   . Hypothyroidism   . Internal hemorrhoids without mention of complication   . Moderate mitral regurgitation   . Obesity, unspecified   . OSA on CPAP    compliant with CPAP  . Other urinary incontinence   . Persistent atrial fibrillation ([place])   . Pure hypercholesterolemia   . Type II diabetes mellitus ([place])   . Urinary frequency   . Urinary urgency   . Vitamin B 12 deficiency     Past Surgical History:  Procedure  Laterality Date  . ABDOMINAL HYSTERECTOMY    . ABDOMINAL HYSTERECTOMY  1972   Have both ovaries  . ATRIAL FIBRILLATION ABLATION  12/17/2016  . ATRIAL FIBRILLATION ABLATION N/A 12/17/2016   Procedure: ATRIAL FIBRILLATION ABLATION;  Surgeon: Thompson Grayer, MD;  Location: [facility];  Service: Cardiovascular;  Laterality: N/A;  . [place]   [facility]; "Dr. [name]"  . CARDIOVERSION N/A 05/27/2016   Procedure: CARDIOVERSION;  Surgeon: [name], MD;  Location: [facility];  Service: Cardiovascular;  Laterality: N/A;  . CARDIOVERSION N/A 11/25/2016   Procedure: CARDIOVERSION;  Surgeon: [name], MD;  Location: [facility];  Service: Cardiovascular;  Laterality: N/A;  . CARDIOVERSION N/A 09/18/2018   Procedure: CARDIOVERSION;  Surgeon: [name], MD;  Location: [facility];  Service: Cardiovascular;  Laterality: N/A;  . CARDIOVERSION N/A 07/28/2019   Procedure: CARDIOVERSION;  Surgeon: Kate Sable, MD;  Location: [facility];  Service: Cardiovascular;  Laterality: N/A;  . CARDIOVERSION N/A 09/07/2019   Procedure: CARDIOVERSION;  Surgeon: Nelva Bush, MD;  Location: [facility];  Service: Cardiovascular;  Laterality: N/A;  . CATARACT EXTRACTION W/ INTRAOCULAR LENS  IMPLANT, BILATERAL Bilateral   . COLONOSCOPY    .  ELECTROPHYSIOLOGIC STUDY N/A 01/24/2016   Procedure: CARDIOVERSION;  Surgeon: [name], MD;  Location: [facility];  Service: Cardiovascular;  Laterality: N/A;  . LAPAROSCOPIC CHOLECYSTECTOMY    . REDUCTION MAMMAPLASTY Bilateral   . TONSILLECTOMY    . TOTAL HIP ARTHROPLASTY Right 05/12/2018   Procedure: TOTAL HIP ARTHROPLASTY ANTERIOR APPROACH;  Surgeon: Paralee Cancel, MD;  Location: [facility];  Service: Orthopedics;  Laterality: Right;  70 mins    Current Medications: Current Meds  Medication Sig  . atorvastatin (LIPITOR) 10 MG tablet TAKE 1 TABLET BY MOUTH EVERYDAY AT BEDTIME (Patient taking differently: Take 10 mg by mouth at bedtime. )  .  calcium carbonate (OS-CAL - DOSED IN MG OF ELEMENTAL CALCIUM) 1250 (500 Ca) MG tablet Take 1 tablet by mouth daily with breakfast.  . ELIQUIS 5 MG TABS tablet Take 5 mg by mouth 2 (two) times daily.  [name] escitalopram (LEXAPRO) 10 MG tablet Take 10 mg by mouth daily.  . flecainide (TAMBOCOR) 100 MG tablet Take 1 tablet (100 mg total) by mouth 2 (two) times daily.  . hydroxypropyl methylcellulose / hypromellose (ISOPTO TEARS / GONIOVISC) 2.5 % ophthalmic solution Place 1 drop into both eyes 2 (two) times daily as needed for dry eyes.  . metFORMIN (GLUCOPHAGE-XR) 500 MG 24 hr tablet TAKE 2 TABLETS (1,000 MG TOTAL) BY MOUTH 2 (TWO) TIMES DAILY WITH A MEAL.  . metoprolol tartrate (LOPRESSOR) 25 MG tablet Take 0.5 tablets (12.5 mg total) by mouth 2 (two) times daily.  Glory Rosebush ULTRA test strip CHECK BLOOD SUGAR 2 TIMES A DAY AS DIRECTED DX CODE E11.9  . solifenacin (VESICARE) 10 MG tablet Take 10 mg by mouth daily.  . TRADJENTA 5 MG TABS tablet Take 5 mg by mouth daily.  . traZODone (DESYREL) 50 MG tablet Take 1 tablet (50 mg total) by mouth at bedtime as needed for sleep.    Allergies:   Cortisone, Pravachol [pravastatin sodium], and Contrast media [iodinated diagnostic agents]   Social History   Socioeconomic History  . Marital status: Married    Spouse name: Laverna Peace  . Number of children: 2  . Years of education: Not on file  . Highest education level: Not on file  Occupational History  . Occupation: retired    Fish farm manager: UNEMPLOYED  Tobacco Use  . Smoking status: Never Smoker  . Smokeless tobacco: Never Used  Vaping Use  . Vaping Use: Never used  Substance and Sexual Activity  . Alcohol use: No    Alcohol/week: 0.0 standard drinks  . Drug use: No  . Sexual activity: Yes  Other Topics Concern  . Not on file  Social History Narrative   ** Merged History Encounter **       Exercise walks 3 times daily Lives with spouse in [place] - retired from employment 1 son and 1  daughter  Diet fruit, occ fast food, salads and lean meat  3 tea/day, no EtOH, no tobacco, no drugs    Social Determinants of Health   Financial Resource Strain: Low Risk   . Difficulty of Paying Living Expenses: Not hard at all  Food Insecurity: No Food Insecurity  . Worried About Charity fundraiser in the Last Year: Never true  . Ran Out of Food in the Last Year: Never true  Transportation Needs: No Transportation Needs  . Lack of Transportation (Medical): No  . Lack of Transportation (Non-Medical): No  Physical Activity: Inactive  . Days of Exercise per Week: 0 days  .  Minutes of Exercise per Session: 0 min  Stress: Stress Concern Present  . Feeling of Stress : To some extent  Social Connections:   . Frequency of Communication with Friends and Family:   . Frequency of Social Gatherings with Friends and Family:   . Attends Religious Services:   . Active Member of Clubs or Organizations:   . Attends Archivist Meetings:   [name] Marital Status:      Family History:  The patient's family history includes Cancer in her mother; Dementia in her mother; Diabetes in her brother and mother; Hyperlipidemia in her father and mother; Hypertension in her sister; Lung cancer in her father; Pulmonary embolism in her mother; Stroke in her paternal grandfather and paternal grandmother. There is no history of Colon cancer or Esophageal cancer.  ROS:   Review of Systems  Constitutional: Positive for malaise/fatigue. Negative for chills, diaphoresis, fever and weight loss.  HENT: Negative for congestion.   Eyes: Negative for discharge and redness.  Respiratory: Negative for cough, sputum production, shortness of breath and wheezing.   Cardiovascular: Negative for chest pain, palpitations, orthopnea, claudication, leg swelling and PND.  Gastrointestinal: Negative for abdominal pain, blood in stool, heartburn, melena, nausea and vomiting.  Musculoskeletal: Positive for falls. Negative  for myalgias.  Skin: Negative for rash.  Neurological: Positive for dizziness. Negative for tingling, tremors, sensory change, speech change, focal weakness, loss of consciousness and weakness.  Endo/Heme/Allergies: Does not bruise/bleed easily.  Psychiatric/Behavioral: Negative for substance abuse. The patient is not nervous/anxious.   All other systems reviewed and are negative.    EKGs/Labs/Other Studies Reviewed:    Studies reviewed were summarized above. The additional studies were reviewed today:  2D echo 04/2019: 1. Normal LV systolic function; mild LVE; mild eccentric MR; severe LAE.  2. Left ventricular ejection fraction, by estimation, is 55 to 60%. The  left ventricle has normal function. The left ventrical has no regional  wall motion abnormalities. The left ventricular internal cavity size was  mildly dilated. Left ventricular  diastolic parameters are indeterminate.  3. Right ventricular systolic function is normal. The right ventricular  size is normal.  4. Left atrial size was severely dilated.  5. The mitral valve is normal in structure and function. Mild mitral  valve regurgitation. No evidence of mitral stenosis.  6. The aortic valve is tricuspid. Aortic valve regurgitation is not  visualized. Mild aortic valve sclerosis is present, with no evidence of  aortic valve stenosis.  7. The inferior vena cava is normal in size with greater than 50%  respiratory variability, suggesting right atrial pressure of 3 mmHg.   EKG:  EKG is ordered today.  The EKG ordered today demonstrates sinus bradycardia, 54 bpm, first-degree AV block, no acute ST-T changes, QTc 400 ms  Recent Labs: 07/26/2019: B Natriuretic Peptide 235.5; TSH 1.728 08/27/2019: ALT 10 09/02/2019: Hemoglobin 11.7; Platelets 339 09/17/2019: BUN 13; Creatinine, Ser 0.97; Magnesium 1.8; Potassium 5.2; Sodium 137  Recent Lipid Panel    Component Value Date/Time   CHOL 145 08/27/2019 0833   CHOL 147  11/20/2016 0919   TRIG 52.0 08/27/2019 0833   HDL 49.10 08/27/2019 0833   HDL 47 11/20/2016 0919   CHOLHDL 3 08/27/2019 0833   VLDL 10.4 08/27/2019 0833   LDLCALC 85 08/27/2019 0833   LDLCALC 89 11/20/2016 0919   LDLDIRECT 163.1 07/04/2011 0922    PHYSICAL EXAM:    VS:  BP (!) 126/60 (BP Location: Right Arm, Patient Position: Sitting, Cuff Size:  Normal)   Pulse 54   Ht 5\' 5"  (1.651 m)   Wt 173 lb 2 oz (78.5 kg)   LMP  (LMP Unknown)   SpO2 98%   BMI 28.81 kg/m   BMI: Body mass index is 28.81 kg/m.  Physical Exam Constitutional:      Appearance: She is well-developed.  HENT:     Head: Normocephalic and atraumatic.  Eyes:     General:        Right eye: No discharge.        Left eye: No discharge.  Neck:     Vascular: No JVD.  Cardiovascular:     Rate and Rhythm: Regular rhythm. Bradycardia present.     Pulses: No midsystolic click and no opening snap.          Posterior tibial pulses are 2+ on the right side and 2+ on the left side.     Heart sounds: S1 normal and S2 normal. Heart sounds not distant. Murmur heard. High-pitched blowing holosystolic murmur is present with a grade of 2/6 at the apex.  No friction rub.  Pulmonary:     Effort: Pulmonary effort is normal. No respiratory distress.     Breath sounds: Normal breath sounds. No decreased breath sounds, wheezing or rales.  Chest:     Chest wall: No tenderness.  Abdominal:     General: There is no distension.     Palpations: Abdomen is soft.     Tenderness: There is no abdominal tenderness.  Musculoskeletal:     Cervical back: Normal range of motion.  Skin:    General: Skin is warm and dry.     Nails: There is no clubbing.  Neurological:     Mental Status: She is alert and oriented to person, place, and time.  Psychiatric:        Speech: Speech normal.        Behavior: Behavior normal.        Thought Content: Thought content normal.        Judgment: Judgment normal.     Wt Readings from Last 3  Encounters:  10/01/19 173 lb 2 oz (78.5 kg)  09/17/19 175 lb 2 oz (79.4 kg)  09/16/19 175 lb 9.6 oz (79.7 kg)     Orthostatic Vital Signs: Lying: 144/80, 58 bpm Sitting: 150/73, 53 bpm, dizzy Standing: 138/72, 56 bpm Standing x 3 minutes: 150/69, 50 bpm  ASSESSMENT & PLAN:   1. Dizziness: Orthostatic vital signs are negative in the office.  This is not described as a vertigo-like sensation.  Possibly related to the recent titration of flecainide.  In this setting, we will taper her flecainide down to 50 mg twice daily she will otherwise continue low-dose Lopressor 12.5 mg twice daily given flecainide use.  Maintain adequate hydration.  Check CBC.  2. Persistent A. fib/atrial tachycardia: She is maintaining sinus rhythm status post repeat DCCV earlier this month as outlined above.  Flecainide decreased given dizziness as outlined above.  Continue low-dose Lopressor.  Follow-up with EP next month for further recommendations.  CHA2DS2-VASc at least 4 (HTN, age x 1, DM, gender).  Remains on Eliquis 5 mg twice daily.  No symptoms concerning for bleeding.  Recent CBC demonstrated stable hemoglobin.  With onset of dizziness, check CBC.  3. Mitral regurgitation: Mild to moderate by most recent echo.  Continue to monitor clinically.  Not felt to be driving her A. fib.  4. HTN: Blood pressure is well controlled in the office  today.  Continue low-dose metoprolol.  May need to consider transition to carvedilol as outlined above.  5. HLD: LDL of 98 from 01/2019 with normal LFT at that time.  Remains on atorvastatin.  6. Sleep apnea: Continue CPAP.  This was not discussed at today's visit.  Disposition: F/u with Dr. Fletcher Anon or an APP in 1 month, and EP as previously scheduled in 2 weeks.   Medication Adjustments/Labs and Tests Ordered: Current medicines are reviewed at length with the patient today.  Concerns regarding medicines are outlined above. Medication changes, Labs and Tests ordered today are  summarized above and listed in the Patient Instructions accessible in Encounters.   Signed, Christell Faith, PA-C 10/01/2019 10:13 AM     [facility] [address], [place] [address] [phone]

## 2019-09-28 NOTE — Telephone Encounter (Signed)
Patient verbalized understanding of plan of care and is agreeable to appointment Friday, 10/01/19 at 10 am.

## 2019-09-28 NOTE — Telephone Encounter (Signed)
Patient reports occasional dizziness occurring especially when changing from bent over position to standing. She also reports she was dizzy yesterday when standing outside with her husband. She has not like she would pass out.  States she drinks plenty of water and is staying hydrated. 132/83 HR 54 - Morning 153/72 HR 49 - noon 134/62 HR 67 - evening We discussed her BP was not registering too low and that the HR in the 50's was to be expected with her being on a betablocker.  Until the last day or so, she felt the best she had felt in a while.    Advised her to stay hydrated, move slowly when changing positions, and to take BP/HR about 2 hours after morning or evening medications or if she is able to during the dizzy episode. Routing to [place] for review.

## 2019-09-29 DIAGNOSIS — R69 Illness, unspecified: Secondary | ICD-10-CM | POA: Diagnosis not present

## 2019-10-01 ENCOUNTER — Encounter: Payer: Self-pay | Admitting: Physician Assistant

## 2019-10-01 ENCOUNTER — Ambulatory Visit: Payer: Medicare HMO | Admitting: Physician Assistant

## 2019-10-01 ENCOUNTER — Other Ambulatory Visit: Payer: Self-pay

## 2019-10-01 VITALS — BP 126/60 | HR 54 | Ht 65.0 in | Wt 173.1 lb

## 2019-10-01 DIAGNOSIS — G4733 Obstructive sleep apnea (adult) (pediatric): Secondary | ICD-10-CM | POA: Diagnosis not present

## 2019-10-01 DIAGNOSIS — I4819 Other persistent atrial fibrillation: Secondary | ICD-10-CM

## 2019-10-01 DIAGNOSIS — I471 Supraventricular tachycardia: Secondary | ICD-10-CM

## 2019-10-01 DIAGNOSIS — I340 Nonrheumatic mitral (valve) insufficiency: Secondary | ICD-10-CM

## 2019-10-01 DIAGNOSIS — E782 Mixed hyperlipidemia: Secondary | ICD-10-CM | POA: Diagnosis not present

## 2019-10-01 DIAGNOSIS — I10 Essential (primary) hypertension: Secondary | ICD-10-CM

## 2019-10-01 DIAGNOSIS — R42 Dizziness and giddiness: Secondary | ICD-10-CM | POA: Diagnosis not present

## 2019-10-01 MED ORDER — FLECAINIDE ACETATE 100 MG PO TABS
50.0000 mg | ORAL_TABLET | Freq: Two times a day (BID) | ORAL | 3 refills | Status: DC
Start: 2019-10-01 — End: 2020-08-21

## 2019-10-01 NOTE — Patient Instructions (Signed)
Medication Instructions:  1- DECREASE Flecainide Take 0.5 tablets (50 mg total) by mouth 2 (two) times daily *If you need a refill on your cardiac medications before your next appointment, please call your pharmacy*   Lab Work: Your physician recommends that you have lab work today(CBC)  If you have labs (blood work) drawn today and your tests are completely normal, you will receive your results only by:  [place] (if you have MyChart) OR  A paper copy in the mail If you have any lab test that is abnormal or we need to change your treatment, we will call you to review the results.   Testing/Procedures: None ordered   Follow-Up: At [facility], you and your health needs are our priority.  As part of our continuing mission to provide you with exceptional heart care, we have created designated Provider Care Teams.  These Care Teams include your primary Cardiologist (physician) and Advanced Practice Providers (APPs -  Physician Assistants and Nurse Practitioners) who all work together to provide you with the care you need, when you need it.  We recommend signing up for the patient portal called "MyChart".  Sign up information is provided on this After Visit Summary.  MyChart is used to connect with patients for Virtual Visits (Telemedicine).  Patients are able to view lab/test results, encounter notes, upcoming appointments, etc.  Non-urgent messages can be sent to your provider as well.   To learn more about what you can do with MyChart, go to [email or web address].    Your next appointment:   1 month(s)  The format for your next appointment:   In Person  Provider:    You may see Kathlyn Sacramento, MD or Christell Faith, PA-C  Other Instructions Call Tuesday for update on how you are doing!

## 2019-10-02 ENCOUNTER — Telehealth: Payer: Self-pay | Admitting: Pulmonary Disease

## 2019-10-02 DIAGNOSIS — G4733 Obstructive sleep apnea (adult) (pediatric): Secondary | ICD-10-CM | POA: Diagnosis not present

## 2019-10-02 LAB — CBC
Hematocrit: 31.7 % — ABNORMAL LOW (ref 34.0–46.6)
Hemoglobin: 10.5 g/dL — ABNORMAL LOW (ref 11.1–15.9)
MCH: 29.8 pg (ref 26.6–33.0)
MCHC: 33.1 g/dL (ref 31.5–35.7)
MCV: 90 fL (ref 79–97)
Platelets: 318 10*3/uL (ref 150–450)
RBC: 3.52 x10E6/uL — ABNORMAL LOW (ref 3.77–5.28)
RDW: 12.9 % (ref 11.7–15.4)
WBC: 5.6 10*3/uL (ref 3.4–10.8)

## 2019-10-02 NOTE — Telephone Encounter (Signed)
HST 09/28/19 >> AHI 12.6, SpO2 low 75%   Please inform her that her sleep study shows mild obstructive sleep apnea.  Please arrange for ROV with me or NP to discuss treatment options.

## 2019-10-04 ENCOUNTER — Other Ambulatory Visit: Payer: Self-pay | Admitting: Family Medicine

## 2019-10-04 NOTE — Telephone Encounter (Signed)
Called and spoke with pt letting her know the results of the HST and that VS wants [name] to schedule OV. Pt verbalized understanding and an OV has been scheduled for her to see TP 8/10. Nothing further needed.

## 2019-10-05 NOTE — Telephone Encounter (Signed)
Patient calling to update Since changing the flecainide medication her BP has been much better

## 2019-10-05 NOTE — Telephone Encounter (Signed)
Noted. Thank you for the update.

## 2019-10-12 ENCOUNTER — Other Ambulatory Visit: Payer: Self-pay

## 2019-10-12 ENCOUNTER — Encounter: Payer: Self-pay | Admitting: Adult Health

## 2019-10-12 ENCOUNTER — Ambulatory Visit: Payer: Medicare HMO | Admitting: Adult Health

## 2019-10-12 DIAGNOSIS — G4733 Obstructive sleep apnea (adult) (pediatric): Secondary | ICD-10-CM

## 2019-10-12 NOTE — Addendum Note (Signed)
Addended by: Satira Sark D on: 10/12/2019 04:22 PM   Modules accepted: Orders

## 2019-10-12 NOTE — Assessment & Plan Note (Addendum)
Mild OSA with nocturnal hypoxemia , risk factors with DM and A Fib .  Order for new CPAP .   Plan  Patient Instructions  Restart CPAP at bedtime Order for new CPAP machine. Try to wear CPAP for at least 4 hours or more. Do not drive if sleepy. Work on healthy weight Follow-up with Drs. Sood in 3 months and as needed

## 2019-10-12 NOTE — Patient Instructions (Signed)
Restart CPAP at bedtime Order for new CPAP machine. Try to wear CPAP for at least 4 hours or more. Do not drive if sleepy. Work on healthy weight Follow-up with Drs. Sood in 3 months and as needed

## 2019-10-12 NOTE — Progress Notes (Signed)
Reviewed and agree with assessment/plan.   Chesley Mires, MD [facility]/Critical [facility] 10/12/2019, 4:16 PM Pager:  [phone]

## 2019-10-12 NOTE — Progress Notes (Signed)
@Patient  ID: [name], female    DOB: December 23, 1945, 74 y.o.   MRN: [id number]  Chief Complaint  Patient presents with  . Follow-up    OSA     Referring provider: Jinny Sanders, MD  HPI: 74 year old female seen for sleep consult September 16, 2019 for sleep apnea with CPAP intolerance COVID-19 infection and December 2020 treated with monoclonal antibody infusion Medical history significant for diabetes, A. Fib  TEST/EVENTS :  HST 09/28/19 >> AHI 12.6, SpO2 low 75%   PSG 09/14/08 ([facility]) >> AHI 32, SpO2 86%  Echo 04/15/19 >> EF 55 to 60%, mild MR, severe LA dilation     10/12/2019 Follow up : OSA  Patient presents for a 1 month follow-up.  Patient was seen last visit to establish for sleep apnea.  Has been having trouble wearing her CPAP.  Previously diagnosed with severe sleep apnea in 2010.  She had a home sleep study done September 28, 2019 that showed mild sleep apnea with AHI 12.6 SPO2 low 75%. Patient says she tried to restart her CPAP but was unable to tolerate says that she does not feel well wearing it and is very uncomfortable and she does not feel like it helps 1 fit. Says her machine is very old and does not work well  We went over potential complications of untreated sleep apnea and nocturnal hypoxemia. Has some daytime sleepiness. Has A FIb .     Allergies  Allergen Reactions  . Cortisone Shortness Of Breath and Rash  . Pravachol [Pravastatin Sodium]     Leg pain   . Contrast Media [Iodinated Diagnostic Agents] Rash    Immunization History  Administered Date(s) Administered  . Fluad Quad(high Dose 65+) 11/17/2018  . Influenza Split 01/03/2011, 01/17/2012  . Influenza Whole 11/25/2007, 01/18/2009, 11/28/2009  . Influenza, High Dose Seasonal PF 12/19/2016, 12/01/2017  . Influenza,inj,Quad PF,6+ Mos 01/06/2013, 12/07/2013, 12/14/2014, 11/24/2015  . Pneumococcal Conjugate-13 05/11/2013  . Pneumococcal Polysaccharide-23 01/17/2012, 05/26/2017  . Td 04/10/2010  .  Zoster 05/13/2014    Past Medical History:  Diagnosis Date  . Anemia   . Anxiety   . Atrial fibrillation ([place])   . Colon polyps   . Constipation   . Depressive disorder, not elsewhere classified   . Diabetes mellitus without complication ([place])   . Diaphragmatic hernia without mention of obstruction or gangrene   . Disorders of bursae and tendons in shoulder region, unspecified   . Diverticulosis of colon (without mention of hemorrhage)   . External hemorrhoid   . Fundic gland polyps of stomach, benign   . GERD (gastroesophageal reflux disease)   . History of hiatal hernia   . HTN (hypertension)   . Hypertension   . Hypothyroidism   . Internal hemorrhoids without mention of complication   . Moderate mitral regurgitation   . Obesity, unspecified   . OSA on CPAP    compliant with CPAP  . Other urinary incontinence   . Persistent atrial fibrillation ([place])   . Pure hypercholesterolemia   . Type II diabetes mellitus ([place])   . Urinary frequency   . Urinary urgency   . Vitamin B 12 deficiency     Tobacco History: Social History   Tobacco Use  Smoking Status Never Smoker  Smokeless Tobacco Never Used   Counseling given: Not Answered   Outpatient Medications Prior to Visit  Medication Sig Dispense Refill  . atorvastatin (LIPITOR) 10 MG tablet TAKE 1 TABLET BY MOUTH EVERYDAY AT BEDTIME (Patient  taking differently: Take 10 mg by mouth at bedtime. ) 90 tablet 0  . calcium carbonate (OS-CAL - DOSED IN MG OF ELEMENTAL CALCIUM) 1250 (500 Ca) MG tablet Take 1 tablet by mouth daily with breakfast.    . ELIQUIS 5 MG TABS tablet Take 5 mg by mouth 2 (two) times daily.    [name] escitalopram (LEXAPRO) 10 MG tablet Take 10 mg by mouth daily.    . flecainide (TAMBOCOR) 100 MG tablet Take 0.5 tablets (50 mg total) by mouth 2 (two) times daily. 90 tablet 3  . hydroxypropyl methylcellulose / hypromellose (ISOPTO TEARS / GONIOVISC) 2.5 % ophthalmic solution Place 1 drop into both eyes 2 (two)  times daily as needed for dry eyes.    . metFORMIN (GLUCOPHAGE-XR) 500 MG 24 hr tablet TAKE 2 TABLETS (1,000 MG TOTAL) BY MOUTH 2 (TWO) TIMES DAILY WITH A MEAL. 360 tablet 1  . metoprolol tartrate (LOPRESSOR) 25 MG tablet Take 0.5 tablets (12.5 mg total) by mouth 2 (two) times daily.    Glory Rosebush ULTRA test strip CHECK BLOOD SUGAR 2 TIMES A DAY AS DIRECTED DX CODE E11.9 100 strip 7  . solifenacin (VESICARE) 10 MG tablet Take 10 mg by mouth daily.    . TRADJENTA 5 MG TABS tablet Take 5 mg by mouth daily.    . traZODone (DESYREL) 50 MG tablet Take 1 tablet (50 mg total) by mouth at bedtime as needed for sleep. 30 tablet 3   No facility-administered medications prior to visit.     Review of Systems:   Constitutional:   No  weight loss, night sweats,  Fevers, chills, + fatigue, or  lassitude.  HEENT:   No headaches,  Difficulty swallowing,  Tooth/dental problems, or  Sore throat,                No sneezing, itching, ear ache, nasal congestion, post nasal drip,   CV:  No chest pain,  Orthopnea, PND, swelling in lower extremities, anasarca, dizziness, palpitations, syncope.   GI  No heartburn, indigestion, abdominal pain, nausea, vomiting, diarrhea, change in bowel habits, loss of appetite, bloody stools.   Resp:    No excess mucus, no productive cough,  No non-productive cough,  No coughing up of blood.  No change in color of mucus.  No wheezing.  No chest wall deformity  Skin: no rash or lesions.  GU: no dysuria, change in color of urine, no urgency or frequency.  No flank pain, no hematuria   MS:  No joint pain or swelling.  No decreased range of motion.  No back pain.    Physical Exam  BP 120/80 (BP Location: Left Arm, Cuff Size: Normal)   Pulse 82   Temp 97.9 F (36.6 C) (Oral)   Ht 5\' 5"  (1.651 m)   Wt 174 lb 3.2 oz (79 kg)   LMP  (LMP Unknown)   BMI 28.99 kg/m   GEN: A/Ox3; pleasant , NAD, well nourished    HEENT:  [place]/AT,  NOSE-clear, THROAT-clear, no lesions, no  postnasal drip or exudate noted. Class 2 MP airway   NECK:  Supple w/ fair ROM; no JVD; normal carotid impulses w/o bruits; no thyromegaly or nodules palpated; no lymphadenopathy.    RESP  Clear  P & A; w/o, wheezes/ rales/ or rhonchi. no accessory muscle use, no dullness to percussion  CARD:  RRR, no m/r/g, no peripheral edema, pulses intact, no cyanosis or clubbing.  GI:   Soft & nt; nml bowel  sounds; no organomegaly or masses detected.   Musco: Warm bil, no deformities or joint swelling noted.   Neuro: alert, no focal deficits noted.    Skin: Warm, no lesions or rashes    Lab Results:   BMET   BNP  ProBNP No results found for: PROBNP  Imaging: No results found.    No flowsheet data found.  No results found for: NITRICOXIDE      Assessment & Plan:   Sleep apnea Mild OSA with nocturnal hypoxemia , risk factors with DM and A Fib .  Order for new CPAP .   Plan  Patient Instructions  Restart CPAP at bedtime Order for new CPAP machine. Try to wear CPAP for at least 4 hours or more. Do not drive if sleepy. Work on healthy weight Follow-up with Drs. Sood in 3 months and as needed       [employer], NP 10/12/2019

## 2019-10-12 NOTE — Addendum Note (Signed)
Addended by: Satira Sark D on: 10/12/2019 04:16 PM   Modules accepted: Orders

## 2019-10-18 ENCOUNTER — Encounter: Payer: Self-pay | Admitting: Internal Medicine

## 2019-10-18 ENCOUNTER — Ambulatory Visit (INDEPENDENT_AMBULATORY_CARE_PROVIDER_SITE_OTHER): Payer: Medicare HMO | Admitting: Internal Medicine

## 2019-10-18 ENCOUNTER — Other Ambulatory Visit: Payer: Self-pay

## 2019-10-18 VITALS — BP 126/66 | HR 60 | Ht 65.0 in | Wt 171.4 lb

## 2019-10-18 DIAGNOSIS — D6869 Other thrombophilia: Secondary | ICD-10-CM | POA: Diagnosis not present

## 2019-10-18 DIAGNOSIS — I471 Supraventricular tachycardia: Secondary | ICD-10-CM

## 2019-10-18 DIAGNOSIS — G4733 Obstructive sleep apnea (adult) (pediatric): Secondary | ICD-10-CM

## 2019-10-18 DIAGNOSIS — R42 Dizziness and giddiness: Secondary | ICD-10-CM

## 2019-10-18 DIAGNOSIS — I4819 Other persistent atrial fibrillation: Secondary | ICD-10-CM

## 2019-10-18 HISTORY — PX: OTHER SURGICAL HISTORY: SHX169

## 2019-10-18 NOTE — Patient Instructions (Addendum)
Medication Instructions:  Your physician recommends that you continue on your current medications as directed. Please refer to the Current Medication list given to you today.  Labwork: None ordered.  Testing/Procedures: None ordered.  Follow-Up:  01/19/2020 at 2:15 at the [address] office with Dr. Rayann Heman.    Implantable Loop Recorder Placement, Care After This sheet gives you information about how to care for yourself after your procedure. Your health care provider may also give you more specific instructions. If you have problems or questions, contact your health care provider. What can I expect after the procedure? After the procedure, it is common to have:  Soreness or discomfort near the incision.  Some swelling or bruising near the incision.  Follow these instructions at home: Incision care  1.  Leave your outer dressing on for 24 hours.  After 24 hours you can remove your outer dressing and shower. 2. Leave adhesive strips in place. These skin closures may need to stay in place for 1-2 weeks. If adhesive strip edges start to loosen and curl up, you may trim the loose edges.  You may remove the strips if they have not fallen off after 2 weeks. 3. Check your incision area every day for signs of infection. Check for: a. Redness, swelling, or pain. b. Fluid or blood. c. Warmth. d. Pus or a bad smell. 4. Do not take baths, swim, or use a hot tub until your incision is completely healed. 5. If your wound site starts to bleed apply pressure.      If you have any questions/concerns please call the device clinic at [phone].  Activity  Return to your normal activities.  General instructions  Follow instructions from your health care provider about how to manage your implantable loop recorder and transmit the information. Learn how to activate a recording if this is necessary for your type of device.  Do not go through a metal detection gate, and do not let someone hold  a metal detector over your chest. Show your ID card.  Do not have an MRI unless you check with your health care provider first.  Take over-the-counter and prescription medicines only as told by your health care provider.  Keep all follow-up visits as told by your health care provider. This is important. Contact a health care provider if:  You have redness, swelling, or pain around your incision.  You have a fever.  You have pain that is not relieved by your pain medicine.  You have triggered your device because of fainting (syncope) or because of a heartbeat that feels like it is racing, slow, fluttering, or skipping (palpitations). Get help right away if you have:  Chest pain.  Difficulty breathing. Summary  After the procedure, it is common to have soreness or discomfort near the incision.  Change your dressing as told by your health care provider.  Follow instructions from your health care provider about how to manage your implantable loop recorder and transmit the information.  Keep all follow-up visits as told by your health care provider. This is important. This information is not intended to replace advice given to you by your health care provider. Make sure you discuss any questions you have with your health care provider. Document Released: 01/30/2015 Document Revised: 04/05/2017 Document Reviewed: 04/05/2017 Elsevier Patient Education  2020 [employer].

## 2019-10-18 NOTE — Progress Notes (Signed)
PCP: Jinny Sanders, MD Primary Cardiologist: Dr Fletcher Anon Primary EP: Dr Rayann Heman  [name] is a 74 y.o. female who presents today for routine electrophysiology followup.  Since last being seen in our clinic, the patient reports doing reasonably well.  She has had several symptomatic episodes of afib.  Her flecianide was increased.  She then had several falls, with possible transient syncope.  Her flecainide was reduced to 50mg  BID and she has not had further episodes.  She was having postural dizziness which has improved. Today, she denies symptoms of  chest pain, shortness of breath,  Or lower extremity edema.  The patient is otherwise without complaint today.   Past Medical History:  Diagnosis Date  . Anemia   . Anxiety   . Atrial fibrillation ([place])   . Colon polyps   . Constipation   . Depressive disorder, not elsewhere classified   . Diabetes mellitus without complication ([place])   . Diaphragmatic hernia without mention of obstruction or gangrene   . Disorders of bursae and tendons in shoulder region, unspecified   . Diverticulosis of colon (without mention of hemorrhage)   . External hemorrhoid   . Fundic gland polyps of stomach, benign   . GERD (gastroesophageal reflux disease)   . History of hiatal hernia   . HTN (hypertension)   . Hypertension   . Hypothyroidism   . Internal hemorrhoids without mention of complication   . Moderate mitral regurgitation   . Obesity, unspecified   . OSA on CPAP    compliant with CPAP  . Other urinary incontinence   . Persistent atrial fibrillation ([place])   . Pure hypercholesterolemia   . Type II diabetes mellitus ([place])   . Urinary frequency   . Urinary urgency   . Vitamin B 12 deficiency    Past Surgical History:  Procedure Laterality Date  . ABDOMINAL HYSTERECTOMY    . ABDOMINAL HYSTERECTOMY  1972   Have both ovaries  . ATRIAL FIBRILLATION ABLATION  12/17/2016  . ATRIAL FIBRILLATION ABLATION N/A 12/17/2016   Procedure: ATRIAL  FIBRILLATION ABLATION;  Surgeon: Thompson Grayer, MD;  Location: [facility];  Service: Cardiovascular;  Laterality: N/A;  . [place]   [facility]; "Dr. [name]"  . CARDIOVERSION N/A 05/27/2016   Procedure: CARDIOVERSION;  Surgeon: [name], MD;  Location: [facility];  Service: Cardiovascular;  Laterality: N/A;  . CARDIOVERSION N/A 11/25/2016   Procedure: CARDIOVERSION;  Surgeon: [name], MD;  Location: [facility];  Service: Cardiovascular;  Laterality: N/A;  . CARDIOVERSION N/A 09/18/2018   Procedure: CARDIOVERSION;  Surgeon: [name], MD;  Location: [facility];  Service: Cardiovascular;  Laterality: N/A;  . CARDIOVERSION N/A 07/28/2019   Procedure: CARDIOVERSION;  Surgeon: Kate Sable, MD;  Location: [facility];  Service: Cardiovascular;  Laterality: N/A;  . CARDIOVERSION N/A 09/07/2019   Procedure: CARDIOVERSION;  Surgeon: Nelva Bush, MD;  Location: [facility];  Service: Cardiovascular;  Laterality: N/A;  . CATARACT EXTRACTION W/ INTRAOCULAR LENS  IMPLANT, BILATERAL Bilateral   . COLONOSCOPY    . ELECTROPHYSIOLOGIC STUDY N/A 01/24/2016   Procedure: CARDIOVERSION;  Surgeon: [name], MD;  Location: [facility];  Service: Cardiovascular;  Laterality: N/A;  . LAPAROSCOPIC CHOLECYSTECTOMY    . REDUCTION MAMMAPLASTY Bilateral   . TONSILLECTOMY    . TOTAL HIP ARTHROPLASTY Right 05/12/2018   Procedure: TOTAL HIP ARTHROPLASTY ANTERIOR APPROACH;  Surgeon: Paralee Cancel, MD;  Location: [facility];  Service: Orthopedics;  Laterality: Right;  70 mins    ROS- all systems are reviewed and negatives except as per HPI above  Current Outpatient Medications  Medication Sig Dispense Refill  . atorvastatin (LIPITOR) 10 MG tablet TAKE 1 TABLET BY MOUTH EVERYDAY AT BEDTIME 90 tablet 0  . calcium carbonate (OS-CAL - DOSED IN MG OF ELEMENTAL CALCIUM) 1250 (500 Ca) MG tablet Take 1 tablet by mouth daily with breakfast.    . ELIQUIS 5 MG TABS tablet Take 5 mg by mouth  2 (two) times daily.    [name] escitalopram (LEXAPRO) 10 MG tablet Take 10 mg by mouth daily.    . flecainide (TAMBOCOR) 100 MG tablet Take 0.5 tablets (50 mg total) by mouth 2 (two) times daily. 90 tablet 3  . hydroxypropyl methylcellulose / hypromellose (ISOPTO TEARS / GONIOVISC) 2.5 % ophthalmic solution Place 1 drop into both eyes 2 (two) times daily as needed for dry eyes.    . metFORMIN (GLUCOPHAGE-XR) 500 MG 24 hr tablet TAKE 2 TABLETS (1,000 MG TOTAL) BY MOUTH 2 (TWO) TIMES DAILY WITH A MEAL. 360 tablet 1  . metoprolol tartrate (LOPRESSOR) 25 MG tablet Take 0.5 tablets (12.5 mg total) by mouth 2 (two) times daily.    Glory Rosebush ULTRA test strip CHECK BLOOD SUGAR 2 TIMES A DAY AS DIRECTED DX CODE E11.9 100 strip 7  . solifenacin (VESICARE) 10 MG tablet Take 10 mg by mouth daily.    . TRADJENTA 5 MG TABS tablet Take 5 mg by mouth daily.    . traZODone (DESYREL) 50 MG tablet Take 1 tablet (50 mg total) by mouth at bedtime as needed for sleep. 30 tablet 3   No current facility-administered medications for this visit.    Physical Exam: Vitals:   10/18/19 1053  BP: 126/66  Pulse: 60  SpO2: 98%  Weight: 171 lb 6.4 oz (77.7 kg)  Height: 5\' 5"  (1.651 m)    GEN- The patient is well appearing, alert and oriented x 3 today.   Head- normocephalic, atraumatic Eyes-  Sclera clear, conjunctiva pink Ears- hearing intact Oropharynx- clear Lungs- Clear to ausculation bilaterally, normal work of breathing Heart- Regular rate and rhythm, no murmurs, rubs or gallops, PMI not laterally displaced GI- soft, NT, ND, + BS Extremities- no clubbing, cyanosis, or edema  Wt Readings from Last 3 Encounters:  10/18/19 171 lb 6.4 oz (77.7 kg)  10/12/19 174 lb 3.2 oz (79 kg)  10/01/19 173 lb 2 oz (78.5 kg)    EKG tracing ordered today is personally reviewed and shows sinus 60 bpm, PR 188 msec, QRS 88 msec, QTc 416 msec Echo 2/21 reviewed  Assessment and Plan:  1. Persistent afib/ atach She has severe  LA enlargement but was maintaining sinus post ablation on low dose flecainide. She is on eliquis for chads2vasc score of 4 The patient has symptomatic, recurrent atrial fibrillation. she has failed medical therapy with flecainide.  Therapeutic strategies for afib including medicine (tikosyn, amiodarone) and both traditional ablation as well as MAZE were discussed in detail with the patient today. Risk, benefits, and alternatives to each approach for afib were also discussed in detail today. These risks include but are not limited to stroke, bleeding, vascular damage, tamponade, perforation, damage to the esophagus, lungs, and other structures, pulmonary vein stenosis, worsening renal function, and death. The patient understands these risk and wishes to continue her current strategy for now.   We will place ILR to further evaluate her AF burden.  This will help better characterize her symptoms  and advise our next step in treatment.  2. HTN Stable No change required today  3. OSA Does not use her CPAP  4. Moderate MR Consider TEE to further evaluate  5. Recurrent falls with possible syncope Improved with reduced dose of flecainide I would like to proceed with ILR to evaluate for an arrhythmia as the cause.   Risks, benefits and potential toxicities for medications prescribed and/or refilled reviewed with patient today.   Thompson Grayer MD, [facility] 10/18/2019 11:09 AM     DESCRIPTION OF PROCEDURE:  Informed written consent was obtained.  The patient required no sedation for the procedure today.  The patients left chest was prepped and draped. Mapping over the patient's chest was performed to identify the appropriate ILR site.  This area was found to be the left parasternal region over the 3rd-4th intercostal space.  The skin overlying this region was infiltrated with lidocaine for local analgesia.  A 0.5-cm incision was made at the implant site.  A subcutaneous ILR pocket was fashioned using a  combination of sharp and blunt dissection.  A Medtronic Reveal Linq model [id number]) implantable loop recorder was then placed into the pocket R waves were very prominent and measured > 0.2 mV. EBL<1 ml.  Steri- Strips and a sterile dressing were then applied.  There were no early apparent complications.     CONCLUSIONS:   1. Successful implantation of a Medtronic Reveal LINQ implantable loop recorder for afib management and to evaluate transient pauses/ possible recurrent unexplained syncope  2. No early apparent complications.   Thompson Grayer MD, [facility] 10/18/2019 11:24 AM

## 2019-10-20 ENCOUNTER — Other Ambulatory Visit: Payer: Self-pay | Admitting: Family Medicine

## 2019-11-01 ENCOUNTER — Telehealth: Payer: Self-pay

## 2019-11-01 NOTE — Telephone Encounter (Signed)
ILR implanted for AF/Syncope.   Frequent alerts for false AF, appear SR w/ ectopy. Alert was changed to "less sensitive and 6 minutes." Could we extend this to 10 minutes?

## 2019-11-03 NOTE — Progress Notes (Signed)
Cardiology Office Note    Date:  11/09/2019   ID:  [name], DOB Jul 15, 1945, MRN [id number]  PCP:  Jinny Sanders, MD  Cardiologist:  Kathlyn Sacramento, MD  Electrophysiologist:  Thompson Grayer, MD   Chief Complaint: Follow up  History of Present Illness:   [name] is a 74 y.o. female with history of persistent A. fib on Eliquis status post cardioversion in 01/2016,05/2016, and 9/2018status post radiofrequency catheter ablation on 10/16/2018s/p repeat DCCV 7/6/2021followed by EP/A. fib clinic,atrial tachycardia s/p DCCV 09/18/2018,pulmonary hypertension, mitral valve prolapse with mitral regurgitation, DM2,COVID-19 in 02/2019,hypertension, hyperlipidemia, hypothyroidism, anemia, obesity, OSA on CPAP, diverticulosis, GERD, and anxiety who presents for follow up of A. fib.  Prior cardiac cath in 12/2008 for atypical chest pain showed no significant CAD with mildly elevated LVEDP and normal EF.She was initially diagnosed with A. fib in 09/2013 and was quite symptomatic with this. She was started on metoprolol for rate control. Echo at that time showed normal LV systolic function withmild mitral regurgitation, moderately dilated left atrium, and mild to moderate pulmonary hypertension. Given it was felt she would likely need antiarrhythmic therapy due to symptoms,nuclear stress test in 09/2013 was undertaken and was normal. With recurrence of A. fib, she was subsequently placed on flecainideand underwent cardioversion in 01/2016. She had recurrent A. fib in 05/2016 and underwent repeat successful cardioversion at that time. She has subsequently been referred to the A. fib clinic. She had recurrent A. fib in 11/2016 and underwent a third cardioversion at that time. Shethenunderwent radiofrequency catheter ablation on 12/17/2016. She was seen on 09/15/2018 notinga 4 to 5-day history of mild palpitations that feltdifferent than her prior episodes of A. fibalong with somebrief  episodesof dizziness. After review with EP, she was noted to be in atrial tachycardia with long cycle Mobitz type I AV block. Her flecainide was increased to 50 mg bid and she underwent successful DCCV on 09/18/2018. She was diagnosed with COVID-19 in 02/2019, not requiring hospital admissionand received infusion.In follow up with the Afib clinic in 04/2019, she was maintaining sinus rhythm. Echo in 04/2019 showed an EF of 55 to 60%, mildly dilated left ventricular cavity, no regional wall motionabnormalities, normal RV systolic function and ventricular cavity size, severely dilated left atrium measuring 55 mm, mild mitral regurgitation, mild aortic valve sclerosis without evidence of stenosis.She had recurrent Afib in 07/2019, and was scheduled for DCCV. She converted to sinus rhythm prior to this being completed. At that time, her flecainide was titrated to 100 mg bid. She was seen by Dr. Fletcher Anon on 09/02/2019, and was back in symptomatic Afib with noted dizziness, fatigue, and a syncopal episode. Her diltiazem was changed to metoprolol and she subsequently underwent successful DCCV on 09/07/2019 to sinus bradycardia. Her moderate mitral regurgitation was not felt to be driving her Afib. With her severely dilated left atrium, it was felt it may be difficult to keep her in sinus rhythm. She was re-referred to EP in this setting.  She was seen in follow up on 09/17/2019 and was doing well, and maintaining sinus rhythm. She was mildly bradycardic with a heart rate of 54 bpm, though was asymptomatic.  BP was 140/60.  No changes were made at that time.  She was last seen in the office on 10/01/2019 for evaluation of dizziness with possible transient syncope which led to the tapering of her flecainide to 50 mg bid with improvement in symptoms.   She was seen in follow up by Dr. Rayann Heman  on 10/18/2019, and was doing well.  With regards to her Afib burden and symptoms, ILR was implanted to quantify burden and  correlation with symptoms to allow for further recommendations regarding management.   She comes in today accompanied by her husband and is doing very well from a cardiac perspective.  No chest pain, palpitations, dyspnea, presyncope, or syncope.  She has noted some mild intermittent dizziness though this is much improved when compared to prior.  No falls, hematochezia, or melena.  No lower extremity swelling or orthopnea.  She is tolerating Eliquis, flecainide, and metoprolol without issues.   Labs independently reviewed: 09/2019 - HGB 10.5, PLT 318, BUN 13, SCr 0.97, potassium 5.2, magnesium 1.8 08/2019 - TC 145, TG 52, HDL 49, LDL 85, albumin 4.1, AST/ALT normal 07/2019 - TSH normal 05/2019 - A1c 6.2  Past Medical History:  Diagnosis Date  . Anemia   . Anxiety   . Atrial fibrillation ([place])   . Colon polyps   . Constipation   . Depressive disorder, not elsewhere classified   . Diabetes mellitus without complication ([place])   . Diaphragmatic hernia without mention of obstruction or gangrene   . Disorders of bursae and tendons in shoulder region, unspecified   . Diverticulosis of colon (without mention of hemorrhage)   . External hemorrhoid   . Fundic gland polyps of stomach, benign   . GERD (gastroesophageal reflux disease)   . History of hiatal hernia   . HTN (hypertension)   . Hypertension   . Hypothyroidism   . Internal hemorrhoids without mention of complication   . Moderate mitral regurgitation   . Obesity, unspecified   . OSA on CPAP    compliant with CPAP  . Other urinary incontinence   . Persistent atrial fibrillation ([place])   . Pure hypercholesterolemia   . Type II diabetes mellitus ([place])   . Urinary frequency   . Urinary urgency   . Vitamin B 12 deficiency     Past Surgical History:  Procedure Laterality Date  . ABDOMINAL HYSTERECTOMY    . ABDOMINAL HYSTERECTOMY  1972   Have both ovaries  . ATRIAL FIBRILLATION ABLATION  12/17/2016  . ATRIAL FIBRILLATION ABLATION  N/A 12/17/2016   Procedure: ATRIAL FIBRILLATION ABLATION;  Surgeon: Thompson Grayer, MD;  Location: [facility];  Service: Cardiovascular;  Laterality: N/A;  . [place]   [facility]; "Dr. [name]"  . CARDIOVERSION N/A 05/27/2016   Procedure: CARDIOVERSION;  Surgeon: [name], MD;  Location: [facility];  Service: Cardiovascular;  Laterality: N/A;  . CARDIOVERSION N/A 11/25/2016   Procedure: CARDIOVERSION;  Surgeon: [name], MD;  Location: [facility];  Service: Cardiovascular;  Laterality: N/A;  . CARDIOVERSION N/A 09/18/2018   Procedure: CARDIOVERSION;  Surgeon: [name], MD;  Location: [facility];  Service: Cardiovascular;  Laterality: N/A;  . CARDIOVERSION N/A 07/28/2019   Procedure: CARDIOVERSION;  Surgeon: Kate Sable, MD;  Location: [facility];  Service: Cardiovascular;  Laterality: N/A;  . CARDIOVERSION N/A 09/07/2019   Procedure: CARDIOVERSION;  Surgeon: Nelva Bush, MD;  Location: [facility];  Service: Cardiovascular;  Laterality: N/A;  . CATARACT EXTRACTION W/ INTRAOCULAR LENS  IMPLANT, BILATERAL Bilateral   . COLONOSCOPY    . ELECTROPHYSIOLOGIC STUDY N/A 01/24/2016   Procedure: CARDIOVERSION;  Surgeon: [name], MD;  Location: [facility];  Service: Cardiovascular;  Laterality: N/A;  . implantable loop recorder implantation  10/18/2019   Medtronic Reveal [place] model [id number] ([id number]) implantable loop recorder   .  LAPAROSCOPIC CHOLECYSTECTOMY    . REDUCTION MAMMAPLASTY Bilateral   . TONSILLECTOMY    . TOTAL HIP ARTHROPLASTY Right 05/12/2018   Procedure: TOTAL HIP ARTHROPLASTY ANTERIOR APPROACH;  Surgeon: Paralee Cancel, MD;  Location: [facility];  Service: Orthopedics;  Laterality: Right;  70 mins    Current Medications: Current Meds  Medication Sig  . atorvastatin (LIPITOR) 10 MG tablet TAKE 1 TABLET BY MOUTH EVERYDAY AT BEDTIME  . calcium carbonate (OS-CAL - DOSED IN MG OF ELEMENTAL CALCIUM) 1250 (500 Ca) MG tablet Take 1 tablet by  mouth daily with breakfast.  . ELIQUIS 5 MG TABS tablet Take 5 mg by mouth 2 (two) times daily.  [name] escitalopram (LEXAPRO) 10 MG tablet Take 1 tablet by mouth once daily  . flecainide (TAMBOCOR) 100 MG tablet Take 0.5 tablets (50 mg total) by mouth 2 (two) times daily.  . hydroxypropyl methylcellulose / hypromellose (ISOPTO TEARS / GONIOVISC) 2.5 % ophthalmic solution Place 1 drop into both eyes 2 (two) times daily as needed for dry eyes.  . metFORMIN (GLUCOPHAGE-XR) 500 MG 24 hr tablet TAKE 2 TABLETS (1,000 MG TOTAL) BY MOUTH 2 (TWO) TIMES DAILY WITH A MEAL.  . metoprolol tartrate (LOPRESSOR) 25 MG tablet Take 0.5 tablets (12.5 mg total) by mouth 2 (two) times daily.  Glory Rosebush ULTRA test strip CHECK BLOOD SUGAR 2 TIMES A DAY AS DIRECTED DX CODE E11.9  . solifenacin (VESICARE) 10 MG tablet Take 10 mg by mouth daily.  . TRADJENTA 5 MG TABS tablet Take 5 mg by mouth daily.  . traZODone (DESYREL) 50 MG tablet Take 1 tablet (50 mg total) by mouth at bedtime as needed for sleep.    Allergies:   Cortisone, Pravachol [pravastatin sodium], and Contrast media [iodinated diagnostic agents]   Social History   Socioeconomic History  . Marital status: Married    Spouse name: Laverna Peace  . Number of children: 2  . Years of education: Not on file  . Highest education level: Not on file  Occupational History  . Occupation: retired    Fish farm manager: UNEMPLOYED  Tobacco Use  . Smoking status: Never Smoker  . Smokeless tobacco: Never Used  Vaping Use  . Vaping Use: Never used  Substance and Sexual Activity  . Alcohol use: No    Alcohol/week: 0.0 standard drinks  . Drug use: No  . Sexual activity: Yes  Other Topics Concern  . Not on file  Social History Narrative   ** Merged History Encounter **       Exercise walks 3 times daily Lives with spouse in [place] - retired from employment 1 son and 1 daughter  Diet fruit, occ fast food, salads and lean meat  3 tea/day, no EtOH, no tobacco,  no drugs    Social Determinants of Health   Financial Resource Strain: Low Risk   . Difficulty of Paying Living Expenses: Not hard at all  Food Insecurity: No Food Insecurity  . Worried About Charity fundraiser in the Last Year: Never true  . Ran Out of Food in the Last Year: Never true  Transportation Needs: No Transportation Needs  . Lack of Transportation (Medical): No  . Lack of Transportation (Non-Medical): No  Physical Activity: Inactive  . Days of Exercise per Week: 0 days  . Minutes of Exercise per Session: 0 min  Stress: Stress Concern Present  . Feeling of Stress : To some extent  Social Connections:   . Frequency of Communication with Friends and Family: Not  on file  . Frequency of Social Gatherings with Friends and Family: Not on file  . Attends Religious Services: Not on file  . Active Member of Clubs or Organizations: Not on file  . Attends Archivist Meetings: Not on file  . Marital Status: Not on file     Family History:  The patient's family history includes Cancer in her mother; Dementia in her mother; Diabetes in her brother and mother; Hyperlipidemia in her father and mother; Hypertension in her sister; Lung cancer in her father; Pulmonary embolism in her mother; Stroke in her paternal grandfather and paternal grandmother. There is no history of Colon cancer or Esophageal cancer.  ROS:   Review of Systems  Constitutional: Negative for chills, diaphoresis, fever, malaise/fatigue and weight loss.  HENT: Negative for congestion.   Eyes: Negative for discharge and redness.  Respiratory: Negative for cough, sputum production, shortness of breath and wheezing.   Cardiovascular: Negative for chest pain, palpitations, orthopnea, claudication, leg swelling and PND.  Gastrointestinal: Negative for abdominal pain, blood in stool, heartburn, melena, nausea and vomiting.  Musculoskeletal: Negative for falls and myalgias.  Skin: Negative for rash.    Neurological: Positive for dizziness. Negative for tingling, tremors, sensory change, speech change, focal weakness, loss of consciousness and weakness.  Endo/Heme/Allergies: Does not bruise/bleed easily.  Psychiatric/Behavioral: Negative for substance abuse. The patient is not nervous/anxious.   All other systems reviewed and are negative.    EKGs/Labs/Other Studies Reviewed:    Studies reviewed were summarized above. The additional studies were reviewed today:  2D echo 04/2019: 1. Normal LV systolic function; mild LVE; mild eccentric MR; severe LAE.  2. Left ventricular ejection fraction, by estimation, is 55 to 60%. The  left ventricle has normal function. The left ventrical has no regional  wall motion abnormalities. The left ventricular internal cavity size was  mildly dilated. Left ventricular  diastolic parameters are indeterminate.  3. Right ventricular systolic function is normal. The right ventricular  size is normal.  4. Left atrial size was severely dilated.  5. The mitral valve is normal in structure and function. Mild mitral  valve regurgitation. No evidence of mitral stenosis.  6. The aortic valve is tricuspid. Aortic valve regurgitation is not  visualized. Mild aortic valve sclerosis is present, with no evidence of  aortic valve stenosis.  7. The inferior vena cava is normal in size with greater than 50%  respiratory variability, suggesting right atrial pressure of 3 mmHg.   EKG:  EKG is ordered today.  The EKG ordered today demonstrates sinus bradycardia with sinus arrhythmia, 47 bpm, no acute ST-T changes  Recent Labs: 07/26/2019: B Natriuretic Peptide 235.5; TSH 1.728 08/27/2019: ALT 10 09/17/2019: BUN 13; Creatinine, Ser 0.97; Magnesium 1.8; Potassium 5.2; Sodium 137 10/01/2019: Hemoglobin 10.5; Platelets 318  Recent Lipid Panel    Component Value Date/Time   CHOL 145 08/27/2019 0833   CHOL 147 11/20/2016 0919   TRIG 52.0 08/27/2019 0833   HDL 49.10  08/27/2019 0833   HDL 47 11/20/2016 0919   CHOLHDL 3 08/27/2019 0833   VLDL 10.4 08/27/2019 0833   LDLCALC 85 08/27/2019 0833   LDLCALC 89 11/20/2016 0919   LDLDIRECT 163.1 07/04/2011 0922    PHYSICAL EXAM:    VS:  BP 120/60 (BP Location: Left Arm, Patient Position: Sitting, Cuff Size: Normal)   Pulse (!) 47   Ht 5\' 5"  (1.651 m)   Wt 172 lb 6.4 oz (78.2 kg)   LMP  (LMP Unknown)  SpO2 97%   BMI 28.69 kg/m   BMI: Body mass index is 28.69 kg/m.  Physical Exam Constitutional:      Appearance: She is well-developed.  HENT:     Head: Normocephalic and atraumatic.  Eyes:     General:        Right eye: No discharge.        Left eye: No discharge.  Neck:     Vascular: No JVD.  Cardiovascular:     Rate and Rhythm: Regular rhythm. Bradycardia present.     Pulses: No midsystolic click and no opening snap.     Heart sounds: S1 normal and S2 normal. Heart sounds not distant. Murmur heard. High-pitched blowing holosystolic murmur is present with a grade of 1/6 at the apex.  No friction rub.  Pulmonary:     Effort: Pulmonary effort is normal. No respiratory distress.     Breath sounds: Normal breath sounds. No decreased breath sounds, wheezing or rales.  Chest:     Chest wall: No tenderness.  Abdominal:     General: There is no distension.     Palpations: Abdomen is soft.     Tenderness: There is no abdominal tenderness.  Musculoskeletal:     Cervical back: Normal range of motion.  Skin:    General: Skin is warm and dry.     Nails: There is no clubbing.  Neurological:     Mental Status: She is alert and oriented to person, place, and time.  Psychiatric:        Speech: Speech normal.        Behavior: Behavior normal.        Thought Content: Thought content normal.        Judgment: Judgment normal.     Wt Readings from Last 3 Encounters:  11/09/19 172 lb 6.4 oz (78.2 kg)  10/18/19 171 lb 6.4 oz (77.7 kg)  10/12/19 174 lb 3.2 oz (79 kg)     ASSESSMENT & PLAN:    1. Persistent A. fib/atrial tachycardia: Maintaining sinus rhythm with a bradycardic heart rate and is asymptomatic.  Continue low-dose flecainide and metoprolol.  Status post ILR.  She remains on Eliquis given a CHA2DS2-VASc of 4 with no symptoms concerning for bleeding and recent CBC demonstrating stable hemoglobin.  Follow-up with EP as directed.  2. Dizziness: Much improved on lower dose of flecainide.  3. Mitral regurgitation: Mild by most recent echo from 04/2019.  Plan to trend transthoracic echo in 04/2020.  Given mitral regurgitation was mild on most recent echo we will defer TEE at this time.  4. HTN: Blood pressure is well controlled.  Continue low-dose metoprolol.  Could consider transition to carvedilol if she becomes symptomatic with regard to heart rate.  5. HLD: LDL of 98 from 01/2019 with normal LFT at that time.  She remains on atorvastatin.  6. Sleep apnea: Continue CPAP.  This was not discussed at today's visit.  Disposition: F/u with Dr. Fletcher Anon or an APP in 5 to 6 months after echo, and EP as directed.   Medication Adjustments/Labs and Tests Ordered: Current medicines are reviewed at length with the patient today.  Concerns regarding medicines are outlined above. Medication changes, Labs and Tests ordered today are summarized above and listed in the Patient Instructions accessible in Encounters.   Signed, Christell Faith, PA-C 11/09/2019 8:50 AM     [facility] [address] [place] [address] [place], [place] [address] [phone]

## 2019-11-06 NOTE — Telephone Encounter (Signed)
Ok to extend to 10 minutes

## 2019-11-09 ENCOUNTER — Encounter: Payer: Self-pay | Admitting: Physician Assistant

## 2019-11-09 ENCOUNTER — Ambulatory Visit: Payer: Medicare HMO | Admitting: Physician Assistant

## 2019-11-09 ENCOUNTER — Other Ambulatory Visit: Payer: Self-pay

## 2019-11-09 VITALS — BP 120/60 | HR 47 | Ht 65.0 in | Wt 172.4 lb

## 2019-11-09 DIAGNOSIS — I10 Essential (primary) hypertension: Secondary | ICD-10-CM | POA: Diagnosis not present

## 2019-11-09 DIAGNOSIS — R42 Dizziness and giddiness: Secondary | ICD-10-CM | POA: Diagnosis not present

## 2019-11-09 DIAGNOSIS — E782 Mixed hyperlipidemia: Secondary | ICD-10-CM

## 2019-11-09 DIAGNOSIS — I471 Supraventricular tachycardia: Secondary | ICD-10-CM

## 2019-11-09 DIAGNOSIS — I4819 Other persistent atrial fibrillation: Secondary | ICD-10-CM

## 2019-11-09 DIAGNOSIS — I340 Nonrheumatic mitral (valve) insufficiency: Secondary | ICD-10-CM

## 2019-11-09 NOTE — Patient Instructions (Signed)
Medication Instructions:  1- Your physician recommends that you continue on your current medications as directed. Please refer to the Current Medication list given to you today. *If you need a refill on your cardiac medications before your next appointment, please call your pharmacy*   Lab Work: none ordered If you have labs (blood work) drawn today and your tests are completely normal, you will receive your results only by: [name] MyChart Message (if you have MyChart) OR . A paper copy in the mail If you have any lab test that is abnormal or we need to change your treatment, we will call you to review the results.   Testing/Procedures: 1- Echo  Please return to [facility] on ________FEB 2022______ at _______________ AM/PM for an Echocardiogram. Your physician has requested that you have an echocardiogram. Echocardiography is a painless test that uses sound waves to create images of your heart. It provides your doctor with information about the size and shape of your heart and how well your heart's chambers and valves are working. This procedure takes approximately one hour. There are no restrictions for this procedure. Please note; depending on visual quality an IV may need to be placed.     Follow-Up: At [facility], you and your health needs are our priority.  As part of our continuing mission to provide you with exceptional heart care, we have created designated Provider Care Teams.  These Care Teams include your primary Cardiologist (physician) and Advanced Practice Providers (APPs -  Physician Assistants and Nurse Practitioners) who all work together to provide you with the care you need, when you need it.  We recommend signing up for the patient portal called "MyChart".  Sign up information is provided on this After Visit Summary.  MyChart is used to connect with patients for Virtual Visits (Telemedicine).  Patients are able to view lab/test results, encounter notes,  upcoming appointments, etc.  Non-urgent messages can be sent to your provider as well.   To learn more about what you can do with MyChart, go to [email or web address].    Your next appointment:   5-6 month(s)  The format for your next appointment:   In Person  Provider:    You may see Kathlyn Sacramento, MD or Christell Faith, PA-C

## 2019-11-10 NOTE — Telephone Encounter (Signed)
Reprogramming complete in [facility], extending AF alerts to 10 minutes.

## 2019-11-18 ENCOUNTER — Encounter: Payer: Self-pay | Admitting: Internal Medicine

## 2019-11-18 ENCOUNTER — Ambulatory Visit: Payer: Medicare HMO | Admitting: Internal Medicine

## 2019-11-18 ENCOUNTER — Other Ambulatory Visit: Payer: Self-pay

## 2019-11-18 VITALS — BP 152/76 | HR 60 | Ht 65.0 in | Wt 174.0 lb

## 2019-11-18 DIAGNOSIS — E119 Type 2 diabetes mellitus without complications: Secondary | ICD-10-CM

## 2019-11-18 DIAGNOSIS — E7800 Pure hypercholesterolemia, unspecified: Secondary | ICD-10-CM | POA: Diagnosis not present

## 2019-11-18 DIAGNOSIS — E063 Autoimmune thyroiditis: Secondary | ICD-10-CM

## 2019-11-18 LAB — POCT GLYCOSYLATED HEMOGLOBIN (HGB A1C): Hemoglobin A1C: 6.5 % — AB (ref 4.0–5.6)

## 2019-11-18 NOTE — Patient Instructions (Signed)
Please continue: - Metformin ER 1000 mg 2x a day with meals - Tradjenta 5 mg before lunch  Please come back for a follow-up appointment in 4-6 months. 

## 2019-11-18 NOTE — Progress Notes (Signed)
Patient ID: Emily PIECHOWSKI, female   DOB: October 21, 1945, 74 y.o.   MRN: [id number]  This visit occurred during the SARS-CoV-2 public health emergency.  Safety protocols were in place, including screening questions prior to the visit, additional usage of staff PPE, and extensive cleaning of exam room while observing appropriate contact time as indicated for disinfecting solutions.   HPI: [name] is a 74 y.o.-year-old female, presenting for f/u for DM2, dx 1995, insulin-dependent since 2014 - now off insulin since 12/2016, with improved control, without long term complications. Last visit 6 months ago.  She developed COVID-19 in 02/2019.  She had infusion of Bamlanivimab.  She recovered well.  Reviewed HbA1c levels: Lab Results  Component Value Date   HGBA1C 6.2 (A) 05/17/2019   HGBA1C 6.1 (A) 11/17/2018   HGBA1C 6.3 (H) 05/07/2018   HGBA1C 6.2 (A) 04/16/2018   HGBA1C 6.4 (A) 12/01/2017   HGBA1C 6.7 (A) 07/25/2017   HGBA1C 6.7 03/27/2017   HGBA1C 6.4 (H) 11/20/2016   HGBA1C 6.9 09/16/2016   HGBA1C 6.9 05/23/2016  01/25/2019: HbA1c 6.3%  Pt is on a regimen of: - Metformin ER 1000 mg twice a day with meals.  She tried to take the entire dose with dinner but this was not as effective. - Tradjenta 5 mg before lunch She was on Basaglar 12 >> 14 units in hs >> stopped at last visit - but still takes it seldom (10-12 units)  Pt checks her sugars twice a day: - am: 100-127 >> 90-120 >> 90-150 >> 79-120, 140 (chocolate milk at night) - 2h after b'fast: 90-103 >> 113-141 >> n/c - before lunch:90-130 >> 100-125, 199 >> n/c >> 80-120 - 2h after lunch: 140-160s >> n/c  >> 140-180 >> n/c  - before dinner: n/c >> 102-144 >> n/c >> 80 >> n/c - 2h after dinner: 140-180 >> n/c >> 160-180, 200 >> 150-180, 220 - bedtime: 120-160 >> 140-160 >> n/c  - nighttime: n/c >> 90 Lowest sugar was 80 (after working outside) >> 90; she has hypoglycemia awareness in the 80s. Highest sugar was 200 >> 220  (sweets).  Pt's meals are: - Breakfast: cereals + 2% milk; bread + cheese; bread + PB - Lunch: sandwich; soup - Dinner: chicken; steak; seafood - Snacks: 2: PB crackers  -No CKD, last BUN/creatinine:  Lab Results  Component Value Date   BUN 13 09/17/2019   CREATININE 0.97 09/17/2019  Not on ACE inhibitor/ARB. -+ HL; last set of lipids: Lab Results  Component Value Date   CHOL 145 08/27/2019   HDL 49.10 08/27/2019   LDLCALC 85 08/27/2019   LDLDIRECT 163.1 07/04/2011   TRIG 52.0 08/27/2019   CHOLHDL 3 08/27/2019  01/25/2019: 165/60/55/98 On Lipitor. - last eye exam was in 2021: No DR. Dry eyes (got drops). [name]  She had cataract surgery in 04 and 07/2016.  In 03/2018 she had surgery at [facility]: film cleaned from her eye. -No numbness and tingling in her feet.  She has A. Fib >> she had several cardioversions. On Flecainide (dose lowered), Diltiazem, Eliquis. She was previously on Vesicare but had to stop due to cost.  She started [place], but this is not working as well. 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 She has a history of sciatica for which she had prednisone in the past.   She also was diagnosed with euthyroid Hashimoto's thyroiditis: Lab Results  Component Value Date   TSH 1.728 07/26/2019   TSH 2.719  11/18/2016   TSH 3.402 01/22/2016   TSH 2.885 09/24/2013   No results found for: FREET4  No results found for: T3FREE   01/25/2019: TSH 4.26 (0.45-4.5), total T4 5.6 (4.5-12), Free T4 0.93 (0.82-1.77), free T3 2.6 (2-4.4), TPO antibodies 191 (0-34), thyroglobulin antibodies 21.6 (0-0.9)  Pt denies: - feeling nodules in neck - hoarseness - dysphagia - choking - SOB with lying down  No FH of thyroid ds. No FH of thyroid cancer. No h/o radiation tx to head or neck.  No herbal supplements. No Biotin use. No recent steroids use.   ROS: Constitutional: no weight gain/no weight loss, no fatigue, no subjective hyperthermia, no  subjective hypothermia Eyes: no blurry vision, no xerophthalmia ENT: + sore throat, + see HPI Cardiovascular: no CP/no SOB/no palpitations/no leg swelling Respiratory: no cough/no SOB/no wheezing Gastrointestinal: no N/no V/no D/no C/no acid reflux Musculoskeletal: no muscle aches/no joint aches Skin: no rashes, no hair loss, + friable nails Neurological: no tremors/no numbness/no tingling/no dizziness  I reviewed pt's medications, allergies, PMH, social hx, family hx, and changes were documented in the history of present illness. Otherwise, unchanged from my initial visit note.  Past Medical History:  Diagnosis Date  . Anemia   . Anxiety   . Atrial fibrillation ([place])   . Colon polyps   . Constipation   . Depressive disorder, not elsewhere classified   . Diabetes mellitus without complication ([place])   . Diaphragmatic hernia without mention of obstruction or gangrene   . Disorders of bursae and tendons in shoulder region, unspecified   . Diverticulosis of colon (without mention of hemorrhage)   . External hemorrhoid   . Fundic gland polyps of stomach, benign   . GERD (gastroesophageal reflux disease)   . History of hiatal hernia   . HTN (hypertension)   . Hypertension   . Hypothyroidism   . Internal hemorrhoids without mention of complication   . Moderate mitral regurgitation   . Obesity, unspecified   . OSA on CPAP    compliant with CPAP  . Other urinary incontinence   . Persistent atrial fibrillation ([place])   . Pure hypercholesterolemia   . Type II diabetes mellitus ([place])   . Urinary frequency   . Urinary urgency   . Vitamin B 12 deficiency    Past Surgical History:  Procedure Laterality Date  . ABDOMINAL HYSTERECTOMY    . ABDOMINAL HYSTERECTOMY  1972   Have both ovaries  . ATRIAL FIBRILLATION ABLATION  12/17/2016  . ATRIAL FIBRILLATION ABLATION N/A 12/17/2016   Procedure: ATRIAL FIBRILLATION ABLATION;  Surgeon: Thompson Grayer, MD;  Location: [place] [facility];   Service: Cardiovascular;  Laterality: N/A;  . [place]   [facility]; "Dr. [name]"  . CARDIOVERSION N/A 05/27/2016   Procedure: CARDIOVERSION;  Surgeon: [name], MD;  Location: [facility];  Service: Cardiovascular;  Laterality: N/A;  . CARDIOVERSION N/A 11/25/2016   Procedure: CARDIOVERSION;  Surgeon: [name], MD;  Location: [facility];  Service: Cardiovascular;  Laterality: N/A;  . CARDIOVERSION N/A 09/18/2018   Procedure: CARDIOVERSION;  Surgeon: [name], MD;  Location: [place] [facility];  Service: Cardiovascular;  Laterality: N/A;  . CARDIOVERSION N/A 07/28/2019   Procedure: CARDIOVERSION;  Surgeon: Kate Sable, MD;  Location: [facility];  Service: Cardiovascular;  Laterality: N/A;  . CARDIOVERSION N/A 09/07/2019   Procedure: CARDIOVERSION;  Surgeon: Nelva Bush, MD;  Location: [facility];  Service: Cardiovascular;  Laterality: N/A;  . CATARACT EXTRACTION W/ INTRAOCULAR LENS  IMPLANT,  BILATERAL Bilateral   . COLONOSCOPY    . ELECTROPHYSIOLOGIC STUDY N/A 01/24/2016   Procedure: CARDIOVERSION;  Surgeon: [name], MD;  Location: [facility];  Service: Cardiovascular;  Laterality: N/A;  . implantable loop recorder implantation  10/18/2019   Medtronic Reveal [place] model [id number] ([id number]) implantable loop recorder   . LAPAROSCOPIC CHOLECYSTECTOMY    . REDUCTION MAMMAPLASTY Bilateral   . TONSILLECTOMY    . TOTAL HIP ARTHROPLASTY Right 05/12/2018   Procedure: TOTAL HIP ARTHROPLASTY ANTERIOR APPROACH;  Surgeon: Paralee Cancel, MD;  Location: [facility];  Service: Orthopedics;  Laterality: Right;  70 mins   Social History   Socioeconomic History  . Marital status: Married    Spouse name: Laverna Peace  . Number of children: 2  . Years of education: Not on file  . Highest education level: Not on file  Occupational History  . Occupation: retired    Fish farm manager: UNEMPLOYED  Tobacco Use  . Smoking status: Never Smoker  . Smokeless tobacco: Never Used  Vaping Use  .  Vaping Use: Never used  Substance and Sexual Activity  . Alcohol use: No    Alcohol/week: 0.0 standard drinks  . Drug use: No  . Sexual activity: Yes  Other Topics Concern  . Not on file  Social History Narrative   ** Merged History Encounter **       Exercise walks 3 times daily Lives with spouse in [place] - retired from employment 1 son and 1 daughter  Diet fruit, occ fast food, salads and lean meat  3 tea/day, no EtOH, no tobacco, no drugs    Social Determinants of Health   Financial Resource Strain: Low Risk   . Difficulty of Paying Living Expenses: Not hard at all  Food Insecurity: No Food Insecurity  . Worried About Charity fundraiser in the Last Year: Never true  . Ran Out of Food in the Last Year: Never true  Transportation Needs: No Transportation Needs  . Lack of Transportation (Medical): No  . Lack of Transportation (Non-Medical): No  Physical Activity: Inactive  . Days of Exercise per Week: 0 days  . Minutes of Exercise per Session: 0 min  Stress: Stress Concern Present  . Feeling of Stress : To some extent  Social Connections:   . Frequency of Communication with Friends and Family: Not on file  . Frequency of Social Gatherings with Friends and Family: Not on file  . Attends Religious Services: Not on file  . Active Member of Clubs or Organizations: Not on file  . Attends Archivist Meetings: Not on file  . Marital Status: Not on file  Intimate Partner Violence: Not At Risk  . Fear of Current or Ex-Partner: No  . Emotionally Abused: No  . Physically Abused: No  . Sexually Abused: No   Current Outpatient Medications on File Prior to Visit  Medication Sig Dispense Refill  . atorvastatin (LIPITOR) 10 MG tablet TAKE 1 TABLET BY MOUTH EVERYDAY AT BEDTIME 90 tablet 0  . calcium carbonate (OS-CAL - DOSED IN MG OF ELEMENTAL CALCIUM) 1250 (500 Ca) MG tablet Take 1 tablet by mouth daily with breakfast.    . ELIQUIS 5 MG TABS tablet Take  5 mg by mouth 2 (two) times daily.    [name] escitalopram (LEXAPRO) 10 MG tablet Take 1 tablet by mouth once daily 90 tablet 1  . flecainide (TAMBOCOR) 100 MG tablet Take 0.5 tablets (50 mg total) by mouth 2 (two) times daily. 90 tablet  3  . hydroxypropyl methylcellulose / hypromellose (ISOPTO TEARS / GONIOVISC) 2.5 % ophthalmic solution Place 1 drop into both eyes 2 (two) times daily as needed for dry eyes.    . metFORMIN (GLUCOPHAGE-XR) 500 MG 24 hr tablet TAKE 2 TABLETS (1,000 MG TOTAL) BY MOUTH 2 (TWO) TIMES DAILY WITH A MEAL. 360 tablet 1  . metoprolol tartrate (LOPRESSOR) 25 MG tablet Take 0.5 tablets (12.5 mg total) by mouth 2 (two) times daily.    Glory Rosebush ULTRA test strip CHECK BLOOD SUGAR 2 TIMES A DAY AS DIRECTED DX CODE E11.9 100 strip 7  . solifenacin (VESICARE) 10 MG tablet Take 10 mg by mouth daily.    . TRADJENTA 5 MG TABS tablet Take 5 mg by mouth daily.    . traZODone (DESYREL) 50 MG tablet Take 1 tablet (50 mg total) by mouth at bedtime as needed for sleep. 30 tablet 3   No current facility-administered medications on file prior to visit.   Allergies  Allergen Reactions  . Cortisone Shortness Of Breath and Rash  . Pravachol [Pravastatin Sodium]     Leg pain   . Contrast Media [Iodinated Diagnostic Agents] Rash   Family History  Problem Relation Age of Onset  . Cancer Mother        fallopian tube  . Dementia Mother   . Pulmonary embolism Mother   . Hyperlipidemia Mother   . Diabetes Mother   . Hyperlipidemia Father   . Lung cancer Father   . Hypertension Sister   . Diabetes Brother   . Stroke Paternal Grandmother   . Stroke Paternal Grandfather   . Colon cancer Neg Hx   . Esophageal cancer Neg Hx     PE: BP (!) 152/76   Pulse 60   Ht 5\' 5"  (1.651 m)   Wt 174 lb (78.9 kg)   LMP  (LMP Unknown)   SpO2 97%   BMI 28.96 kg/m  Body mass index is 28.96 kg/m. Wt Readings from Last 3 Encounters:  11/18/19 174 lb (78.9 kg)  11/09/19 172 lb 6.4 oz (78.2 kg)   10/18/19 171 lb 6.4 oz (77.7 kg)  06/30/2018: Weight: 157 lbs  Constitutional: overweight, in NAD Eyes: PERRLA, EOMI, no exophthalmos ENT: moist mucous membranes, no thyromegaly, no cervical lymphadenopathy Cardiovascular: RRR, No MRG Respiratory: CTA B Gastrointestinal: abdomen soft, NT, ND, BS+ Musculoskeletal: no deformities, strength intact in all 4 Skin: moist, warm, no rashes Neurological: no tremor with outstretched hands, DTR normal in all 4  ASSESSMENT: 1. DM2, previously insulin-dependent, controlled, without long term complications, but with hyperglycemia - she was contemplating gastric sleeve sx >> on hold now  2. Hyperlipidemia  3.  Euthyroid Hashimoto's thyroiditis  PLAN:  1. Patient with longstanding, now controlled, initially insulin-dependent, now off insulin after she started to improve her diet and lose weight.  She is on Metformin ER and DPP 4 inhibitor.  She tolerates these well.  At last visit, sugars were higher especially in the previous few weeks after the death of her mother-in-law.  She was not exercising consistently at that time but was planning to start this.  We did not change her regimen at that time. -At this visit, sugars are all at goal with the exception of this morning's blood sugar at 140, after she had chocolate milk at night. At that time, she woke up with a blood sugar of 90. I advised her that this is a desirable blood sugar and advised at this drinking chocolate milk.  We will continue the current regimen for now. - I suggested to:  Patient Instructions  Please continue: - Metformin ER 1000 mg 2x a day with meals - Tradjenta 5 mg before lunch  Please come back for a follow-up appointment in 4-6 months.  - we checked her HbA1c: 6.5% (slightly higher) - advised to check sugars at different times of the day - 1x a day, rotating check times - advised for yearly eye exams >> she is UTD - return to clinic in 4-6 months  2.   Hyperlipidemia -Reviewed latest lipid panel from 08/2019: Fractions at goal -Continues Lipitor without side effects  3.  Hashimoto's thyroiditis -Her antithyroid antibodies were elevated at last check - latest thyroid labs reviewed with pt >> normal: Lab Results  Component Value Date   TSH 1.728 07/26/2019  -She did not require levothyroxine -Denies neck compression symptoms or hypothyroid symptoms -She has friable nails and I advised her that she can start taking the hair skin and nails vitamin. I advised her to stop this before next visit, since biotin can interfere with the TFT check -We will repeat her TFTs at next visit  [name], MD PhD [facility] Endocrinology

## 2019-11-18 NOTE — Addendum Note (Signed)
Addended by: Cardell Peach I on: 11/18/2019 09:21 AM   Modules accepted: Orders

## 2019-11-19 DIAGNOSIS — R69 Illness, unspecified: Secondary | ICD-10-CM | POA: Diagnosis not present

## 2019-11-21 ENCOUNTER — Other Ambulatory Visit: Payer: Self-pay | Admitting: Internal Medicine

## 2019-11-25 ENCOUNTER — Ambulatory Visit (INDEPENDENT_AMBULATORY_CARE_PROVIDER_SITE_OTHER): Payer: Medicare HMO | Admitting: Emergency Medicine

## 2019-11-25 DIAGNOSIS — I471 Supraventricular tachycardia: Secondary | ICD-10-CM

## 2019-11-25 LAB — CUP PACEART REMOTE DEVICE CHECK
Date Time Interrogation Session: 20210922210628
Implantable Pulse Generator Implant Date: 20210816

## 2019-11-30 NOTE — Progress Notes (Signed)
Carelink Summary Report / Loop Recorder 

## 2019-12-07 ENCOUNTER — Telehealth: Payer: Self-pay

## 2019-12-07 NOTE — Telephone Encounter (Signed)
ILR alert received for 3 AF events, presenting RVR (AT/AF)? with max rate of 158 bpm.   Patient called to assess. States when she woke up she felt dizzy, took her BP and was 536 systolic. Patient states she then felt better and has since had no complaints. Unable to send manual transmission to see current presenting.  Patient denies any shortness of breath, palpitations, chest pain, dizziness or other complaints at this time. States she is compliant with medications and does not miss any doses.   ED precautions reviewed.  Routing to Dr. Rayann Heman for review.

## 2019-12-08 DIAGNOSIS — G4733 Obstructive sleep apnea (adult) (pediatric): Secondary | ICD-10-CM | POA: Diagnosis not present

## 2019-12-08 NOTE — Telephone Encounter (Signed)
Continue to monitor.  I will review when I see her in November.

## 2019-12-09 ENCOUNTER — Encounter: Payer: Self-pay | Admitting: Cardiovascular Disease

## 2019-12-09 ENCOUNTER — Other Ambulatory Visit: Payer: Self-pay

## 2019-12-09 ENCOUNTER — Ambulatory Visit (INDEPENDENT_AMBULATORY_CARE_PROVIDER_SITE_OTHER): Payer: Medicare HMO | Admitting: Cardiovascular Disease

## 2019-12-09 VITALS — BP 160/70 | HR 63 | Ht 65.0 in | Wt 174.4 lb

## 2019-12-09 DIAGNOSIS — E785 Hyperlipidemia, unspecified: Secondary | ICD-10-CM

## 2019-12-09 DIAGNOSIS — I340 Nonrheumatic mitral (valve) insufficiency: Secondary | ICD-10-CM

## 2019-12-09 DIAGNOSIS — I10 Essential (primary) hypertension: Secondary | ICD-10-CM

## 2019-12-09 DIAGNOSIS — I4819 Other persistent atrial fibrillation: Secondary | ICD-10-CM | POA: Diagnosis not present

## 2019-12-09 NOTE — Progress Notes (Signed)
Cardiology Office Note   Date:  12/09/2019   ID:  [name], DOB [birth year], MRN [id number]  PCP:  Jinny Sanders, MD  Cardiologist:   Kathlyn Sacramento, MD   Chief Complaint  Patient presents with   office visit    6 month F/U-Patient would like to discuss loop moniotr results; Meds verbally reviewed with patient.      History of Present Illness: [name] is a 74 y.o. female who presents for a followup visit regarding persistent atrial fibrillation and mitral valve prolapse.  She had previous cardiac catheterization in October of 2010 for atypical chest pain. It showed no significant coronary artery disease with mildly elevated left ventricular end-diastolic pressure and normal ejection fraction. She has known history of diabetes, sleep apnea on CPAP, pulmonary hypertension, essential hypertension, COVID-19 infection in December 2020, obesity, GERD and hyperlipidemia. Nuclear stress test in July 2015 was normal. She had multiple cardioversions in the past and had A. fib ablation in October 2018.  She had atrial tachycardia in July 2020 that required cardioversion.  She had COVID-19 infection in December 2020 but did not require hospitalization.  Echocardiogram in February 2021 showed an EF of 55 to 60%, mildly dilated left ventricle, severely dilated left atrium, mild mitral regurgitation and mild aortic sclerosis without stenosis. Most recent cardioversion was in July with no recurrent episodes since then.  She had episodes of syncope on higher doses of flecainide and the dose was decreased to 50 mg twice daily with no further episodes.  She had a loop recorder placed by Dr. Rayann Heman.  She has not had any significant prolonged episodes of atrial fibrillation in the loop recorder.  She is doing reasonably well with no chest pain, shortness of breath or palpitations.  Past Medical History:  Diagnosis Date   Anemia    Anxiety    Atrial fibrillation (HCC)    Colon polyps     Constipation    Depressive disorder, not elsewhere classified    Diabetes mellitus without complication ([place])    Diaphragmatic hernia without mention of obstruction or gangrene    Disorders of bursae and tendons in shoulder region, unspecified    Diverticulosis of colon (without mention of hemorrhage)    External hemorrhoid    Fundic gland polyps of stomach, benign    GERD (gastroesophageal reflux disease)    History of hiatal hernia    HTN (hypertension)    Hypertension    Hypothyroidism    Internal hemorrhoids without mention of complication    Moderate mitral regurgitation    Obesity, unspecified    OSA on CPAP    compliant with CPAP   Other urinary incontinence    Persistent atrial fibrillation ([place])    Pure hypercholesterolemia    Type II diabetes mellitus ([place])    Urinary frequency    Urinary urgency    Vitamin B 12 deficiency     Past Surgical History:  Procedure Laterality Date   ABDOMINAL HYSTERECTOMY     ABDOMINAL HYSTERECTOMY  1972   Have both ovaries   ATRIAL FIBRILLATION ABLATION  12/17/2016   ATRIAL FIBRILLATION ABLATION N/A 12/17/2016   Procedure: ATRIAL FIBRILLATION ABLATION;  Surgeon: Thompson Grayer, MD;  Location: [facility];  Service: Cardiovascular;  Laterality: N/A;   [place]   [facility]; "Dr. [name]"   CARDIOVERSION N/A 05/27/2016   Procedure: CARDIOVERSION;  Surgeon: [name], MD;  Location: [facility];  Service: Cardiovascular;  Laterality: N/A;  CARDIOVERSION N/A 11/25/2016   Procedure: CARDIOVERSION;  Surgeon: [name], MD;  Location: [facility];  Service: Cardiovascular;  Laterality: N/A;   CARDIOVERSION N/A 09/18/2018   Procedure: CARDIOVERSION;  Surgeon: [name], MD;  Location: [facility];  Service: Cardiovascular;  Laterality: N/A;   CARDIOVERSION N/A 07/28/2019   Procedure: CARDIOVERSION;  Surgeon: Kate Sable, MD;  Location: [facility];  Service: Cardiovascular;   Laterality: N/A;   CARDIOVERSION N/A 09/07/2019   Procedure: CARDIOVERSION;  Surgeon: Nelva Bush, MD;  Location: [facility];  Service: Cardiovascular;  Laterality: N/A;   CATARACT EXTRACTION W/ INTRAOCULAR LENS  IMPLANT, BILATERAL Bilateral    COLONOSCOPY     ELECTROPHYSIOLOGIC STUDY N/A 01/24/2016   Procedure: CARDIOVERSION;  Surgeon: [name], MD;  Location: [facility];  Service: Cardiovascular;  Laterality: N/A;   implantable loop recorder implantation  10/18/2019   Medtronic Reveal [place] model [id number]) implantable loop recorder    LAPAROSCOPIC CHOLECYSTECTOMY     REDUCTION MAMMAPLASTY Bilateral    TONSILLECTOMY     TOTAL HIP ARTHROPLASTY Right 05/12/2018   Procedure: TOTAL HIP ARTHROPLASTY ANTERIOR APPROACH;  Surgeon: Paralee Cancel, MD;  Location: [facility];  Service: Orthopedics;  Laterality: Right;  70 mins     Current Outpatient Medications  Medication Sig Dispense Refill   atorvastatin (LIPITOR) 10 MG tablet TAKE 1 TABLET BY MOUTH EVERYDAY AT BEDTIME 90 tablet 0   calcium carbonate (OS-CAL - DOSED IN MG OF ELEMENTAL CALCIUM) 1250 (500 Ca) MG tablet Take 1 tablet by mouth daily with breakfast.     ELIQUIS 5 MG TABS tablet Take 5 mg by mouth 2 (two) times daily.     escitalopram (LEXAPRO) 10 MG tablet Take 1 tablet by mouth once daily 90 tablet 1   flecainide (TAMBOCOR) 100 MG tablet Take 0.5 tablets (50 mg total) by mouth 2 (two) times daily. 90 tablet 3   hydroxypropyl methylcellulose / hypromellose (ISOPTO TEARS / GONIOVISC) 2.5 % ophthalmic solution Place 1 drop into both eyes 2 (two) times daily as needed for dry eyes.     metFORMIN (GLUCOPHAGE-XR) 500 MG 24 hr tablet TAKE 2 TABLETS (1,000 MG TOTAL) BY MOUTH 2 (TWO) TIMES DAILY WITH A MEAL. 360 tablet 1   metoprolol tartrate (LOPRESSOR) 25 MG tablet Take 0.5 tablets (12.5 mg total) by mouth 2 (two) times daily.     ONETOUCH ULTRA test strip CHECK BLOOD SUGAR 2 TIMES A DAY AS DIRECTED DX CODE E11.9  100 strip 7   solifenacin (VESICARE) 10 MG tablet Take 10 mg by mouth daily.     TRADJENTA 5 MG TABS tablet TAKE 1 TABLET BY MOUTH EVERY DAY 30 tablet 11   traZODone (DESYREL) 50 MG tablet Take 1 tablet (50 mg total) by mouth at bedtime as needed for sleep. 30 tablet 3   No current facility-administered medications for this visit.    Allergies:   Cortisone, Pravachol [pravastatin sodium], and Contrast media [iodinated diagnostic agents]    Social History:  The patient  reports that she has never smoked. She has never used smokeless tobacco. She reports that she does not drink alcohol and does not use drugs.   Family History:  The patient's family history includes Cancer in her brother and mother; Dementia in her mother; Diabetes in her brother and mother; Hyperlipidemia in her father and mother; Hypertension in her sister; Lung cancer in her father; Pulmonary embolism in her mother; Stroke in her paternal grandfather and paternal grandmother.    ROS:  Please see the history of present illness.   Otherwise, review of systems are positive for none.   All other systems are reviewed and negative.    PHYSICAL EXAM: VS:  BP (!) 160/70 (BP Location: Left Arm, Patient Position: Sitting, Cuff Size: Normal)    Pulse 63    Ht 5\' 5"  (1.651 m)    Wt 174 lb 6 oz (79.1 kg)    LMP  (LMP Unknown)    SpO2 96%    BMI 29.02 kg/m  , BMI Body mass index is 29.02 kg/m. GEN: Well nourished, well developed, in no acute distress  HEENT: normal  Neck: no JVD, carotid bruits, or masses Cardiac: Irregularly irregular; no  rubs, or gallops,no edema . There is a 2/6 systolic ejection murmur at the base. Respiratory:  clear to auscultation bilaterally, normal work of breathing GI: soft, nontender, nondistended, + BS MS: no deformity or atrophy  Skin: warm and dry, no rash Neuro:  Strength and sensation are intact Psych: euthymic mood, full affect   EKG:  EKG is ordered today. EKG showed normal sinus rhythm  with a heart rate of 63 bpm.  No significant ST or T wave changes.   Recent Labs: 07/26/2019: B Natriuretic Peptide 235.5; TSH 1.728 08/27/2019: ALT 10 09/17/2019: BUN 13; Creatinine, Ser 0.97; Magnesium 1.8; Potassium 5.2; Sodium 137 10/01/2019: Hemoglobin 10.5; Platelets 318    Lipid Panel    Component Value Date/Time   CHOL 145 08/27/2019 0833   CHOL 147 11/20/2016 0919   TRIG 52.0 08/27/2019 0833   HDL 49.10 08/27/2019 0833   HDL 47 11/20/2016 0919   CHOLHDL 3 08/27/2019 0833   VLDL 10.4 08/27/2019 0833   LDLCALC 85 08/27/2019 0833   LDLCALC 89 11/20/2016 0919   LDLDIRECT 163.1 07/04/2011 0922      Wt Readings from Last 3 Encounters:  12/09/19 174 lb 6 oz (79.1 kg)  11/18/19 174 lb (78.9 kg)  11/09/19 172 lb 6.4 oz (78.2 kg)       No flowsheet data found.    ASSESSMENT AND PLAN:  1.   Persistent atrial fibrillation: Maintaining sinus rhythm with small dose flecainide and metoprolol.  No further syncopal episodes since the dose of flecainide was decreased.  Continue anticoagulation with Eliquis.   If she develops recurrent atrial fibrillation on flecainide, options include dofetilide or amiodarone.  I reviewed her labs done in July which showed anemia with hemoglobin of 10.5.  She was started on iron tablets since then.  No evidence of active bleeding.  Renal function was normal.  2. Mitral valve prolapse with moderate regurgitation: Most recent echocardiogram in February showed that her mitral regurgitation was mild to moderate  3. Hyperlipidemia: Currently on atorvastatin.  4. Sleep apnea: Continue CPAP.  5.  Essential hypertension: Blood pressure initially was elevated but I rechecked and it was 132/64.   Disposition:  Follow up with me in 6 months.   Signed,  Kathlyn Sacramento, MD  12/09/2019 10:06 AM    [place]

## 2019-12-09 NOTE — Patient Instructions (Signed)

## 2019-12-13 ENCOUNTER — Telehealth: Payer: Self-pay

## 2019-12-13 NOTE — Telephone Encounter (Signed)
Receiving almost daily alerts for ILR (implanted for AF/syncope). Could we extend recording threshold? Currently set at >= 10 minutes. 2 current AF events, longest was 22 minutes.

## 2019-12-15 NOTE — Telephone Encounter (Signed)
Ok to extend to longest episode.

## 2019-12-16 ENCOUNTER — Encounter: Payer: Self-pay | Admitting: Adult Health

## 2019-12-16 DIAGNOSIS — D485 Neoplasm of uncertain behavior of skin: Secondary | ICD-10-CM | POA: Diagnosis not present

## 2019-12-16 DIAGNOSIS — L814 Other melanin hyperpigmentation: Secondary | ICD-10-CM | POA: Diagnosis not present

## 2019-12-16 DIAGNOSIS — L821 Other seborrheic keratosis: Secondary | ICD-10-CM | POA: Diagnosis not present

## 2019-12-16 DIAGNOSIS — D229 Melanocytic nevi, unspecified: Secondary | ICD-10-CM | POA: Diagnosis not present

## 2019-12-16 DIAGNOSIS — B079 Viral wart, unspecified: Secondary | ICD-10-CM | POA: Diagnosis not present

## 2019-12-16 DIAGNOSIS — D1801 Hemangioma of skin and subcutaneous tissue: Secondary | ICD-10-CM | POA: Diagnosis not present

## 2019-12-16 NOTE — Telephone Encounter (Signed)
Longest AF episode 42 minutes. ILR reprogrammed to >=30 minutes.

## 2019-12-28 ENCOUNTER — Ambulatory Visit (INDEPENDENT_AMBULATORY_CARE_PROVIDER_SITE_OTHER): Payer: Medicare HMO

## 2019-12-28 DIAGNOSIS — I4819 Other persistent atrial fibrillation: Secondary | ICD-10-CM | POA: Diagnosis not present

## 2019-12-29 ENCOUNTER — Other Ambulatory Visit: Payer: Self-pay | Admitting: Pulmonary Disease

## 2019-12-29 LAB — CUP PACEART REMOTE DEVICE CHECK
Date Time Interrogation Session: 20211025210338
Date Time Interrogation Session: 20211026022702
Implantable Pulse Generator Implant Date: 20210816
Implantable Pulse Generator Implant Date: 20210816

## 2019-12-29 NOTE — Telephone Encounter (Addendum)
Pt is requesting refill on TRAZODONE 50 MG TABLET  next ov 01/12/20. Pt was called aware refill is available for pick up at pharmacy

## 2020-01-03 NOTE — Progress Notes (Signed)
Carelink Summary Report / Loop Recorder 

## 2020-01-05 DIAGNOSIS — R69 Illness, unspecified: Secondary | ICD-10-CM | POA: Diagnosis not present

## 2020-01-08 DIAGNOSIS — G4733 Obstructive sleep apnea (adult) (pediatric): Secondary | ICD-10-CM | POA: Diagnosis not present

## 2020-01-12 ENCOUNTER — Telehealth: Payer: Self-pay

## 2020-01-12 MED ORDER — METOPROLOL TARTRATE 25 MG PO TABS: 25.0000 mg | ORAL_TABLET | Freq: Two times a day (BID) | ORAL | 2 refills | Status: DC

## 2020-01-12 NOTE — Telephone Encounter (Signed)
ILR alert received 01/11/20 for AF with rates in the 140's. Know PAF, on [place].   Patient called to assess. Reports episodes of intermittent dizziness and lightheaded over the past several weeks. States she does not feel that as we speak. Patient reports compliance with all medications. Has apt. With Dr. Rayann Heman 01/19/20. Unable to send manual transmission d/t her device will not allow to check for presenting. Advised patient I will forward to AF clinic considering her rates which have recently been controlled and symptomatic. Patient agreeable to plan.   ED precautions given to patient.

## 2020-01-12 NOTE — Telephone Encounter (Signed)
Discussed with [name] NP will try increasing metoprolol to 25mg  BID until follow up with Dr. Rayann Heman next week. Pt in agreement will call if issues tolerating prior to appt.

## 2020-01-14 ENCOUNTER — Other Ambulatory Visit: Payer: Self-pay | Admitting: Family Medicine

## 2020-01-19 ENCOUNTER — Encounter: Payer: Self-pay | Admitting: Internal Medicine

## 2020-01-19 ENCOUNTER — Ambulatory Visit: Payer: Medicare HMO | Admitting: Internal Medicine

## 2020-01-19 ENCOUNTER — Other Ambulatory Visit: Payer: Self-pay

## 2020-01-19 VITALS — BP 120/60 | HR 55 | Ht 65.0 in | Wt 176.0 lb

## 2020-01-19 DIAGNOSIS — E782 Mixed hyperlipidemia: Secondary | ICD-10-CM | POA: Diagnosis not present

## 2020-01-19 DIAGNOSIS — D6869 Other thrombophilia: Secondary | ICD-10-CM

## 2020-01-19 DIAGNOSIS — I10 Essential (primary) hypertension: Secondary | ICD-10-CM | POA: Diagnosis not present

## 2020-01-19 DIAGNOSIS — I4819 Other persistent atrial fibrillation: Secondary | ICD-10-CM

## 2020-01-19 LAB — CUP PACEART INCLINIC DEVICE CHECK
Date Time Interrogation Session: 20211117144120
Implantable Pulse Generator Implant Date: 20210816

## 2020-01-19 NOTE — Patient Instructions (Addendum)
Medication Instructions:  Your physician recommends that you continue on your current medications as directed. Please refer to the Current Medication list given to you today.  *If you need a refill on your cardiac medications before your next appointment, please call your pharmacy*  Lab Work: None ordered.  If you have labs (blood work) drawn today and your tests are completely normal, you will receive your results only by: [name] MyChart Message (if you have MyChart) OR . A paper copy in the mail If you have any lab test that is abnormal or we need to change your treatment, we will call you to review the results.  Testing/Procedures: None ordered.  Follow-Up: At [facility], you and your health needs are our priority.  As part of our continuing mission to provide you with exceptional heart care, we have created designated Provider Care Teams.  These Care Teams include your primary Cardiologist (physician) and Advanced Practice Providers (APPs -  Physician Assistants and Nurse Practitioners) who all work together to provide you with the care you need, when you need it.  We recommend signing up for the patient portal called "MyChart".  Sign up information is provided on this After Visit Summary.  MyChart is used to connect with patients for Virtual Visits (Telemedicine).  Patients are able to view lab/test results, encounter notes, upcoming appointments, etc.  Non-urgent messages can be sent to your provider as well.   To learn more about what you can do with MyChart, go to [email or web address].    Your next appointment:   Your physician wants you to follow-up in: 9 months with Dr. Rayann Heman. You will receive a reminder letter in the mail two months in advance. If you don't receive a letter, please call our office to schedule the follow-up appointment.   Other Instructions:

## 2020-01-19 NOTE — Progress Notes (Signed)
PCP: Jinny Sanders, MD Primary Cardiologist: Dr Fletcher Anon Primary EP: Dr Rayann Heman  [name] is a 74 y.o. female who presents today for routine electrophysiology followup.  Since last being seen in our clinic, the patient reports doing very well.  Today, she denies symptoms of palpitations, chest pain, shortness of breath,  lower extremity edema, dizziness, presyncope, or syncope.  The patient is otherwise without complaint today.   Past Medical History:  Diagnosis Date  . Anemia   . Anxiety   . Atrial fibrillation ([place])   . Colon polyps   . Constipation   . Depressive disorder, not elsewhere classified   . Diabetes mellitus without complication ([place])   . Diaphragmatic hernia without mention of obstruction or gangrene   . Disorders of bursae and tendons in shoulder region, unspecified   . Diverticulosis of colon (without mention of hemorrhage)   . External hemorrhoid   . Fundic gland polyps of stomach, benign   . GERD (gastroesophageal reflux disease)   . History of hiatal hernia   . HTN (hypertension)   . Hypertension   . Hypothyroidism   . Internal hemorrhoids without mention of complication   . Moderate mitral regurgitation   . Obesity, unspecified   . OSA on CPAP    compliant with CPAP  . Other urinary incontinence   . Persistent atrial fibrillation ([place])   . Pure hypercholesterolemia   . Type II diabetes mellitus ([place])   . Urinary frequency   . Urinary urgency   . Vitamin B 12 deficiency    Past Surgical History:  Procedure Laterality Date  . ABDOMINAL HYSTERECTOMY    . ABDOMINAL HYSTERECTOMY  1972   Have both ovaries  . ATRIAL FIBRILLATION ABLATION  12/17/2016  . ATRIAL FIBRILLATION ABLATION N/A 12/17/2016   Procedure: ATRIAL FIBRILLATION ABLATION;  Surgeon: Thompson Grayer, MD;  Location: [facility];  Service: Cardiovascular;  Laterality: N/A;  . [place]   [facility]; "Dr. [name]"  . CARDIOVERSION N/A 05/27/2016   Procedure:  CARDIOVERSION;  Surgeon: [name], MD;  Location: [facility];  Service: Cardiovascular;  Laterality: N/A;  . CARDIOVERSION N/A 11/25/2016   Procedure: CARDIOVERSION;  Surgeon: [name], MD;  Location: [facility];  Service: Cardiovascular;  Laterality: N/A;  . CARDIOVERSION N/A 09/18/2018   Procedure: CARDIOVERSION;  Surgeon: [name], MD;  Location: [facility];  Service: Cardiovascular;  Laterality: N/A;  . CARDIOVERSION N/A 07/28/2019   Procedure: CARDIOVERSION;  Surgeon: Kate Sable, MD;  Location: [facility];  Service: Cardiovascular;  Laterality: N/A;  . CARDIOVERSION N/A 09/07/2019   Procedure: CARDIOVERSION;  Surgeon: Nelva Bush, MD;  Location: [facility];  Service: Cardiovascular;  Laterality: N/A;  . CATARACT EXTRACTION W/ INTRAOCULAR LENS  IMPLANT, BILATERAL Bilateral   . COLONOSCOPY    . ELECTROPHYSIOLOGIC STUDY N/A 01/24/2016   Procedure: CARDIOVERSION;  Surgeon: [name], MD;  Location: [facility];  Service: Cardiovascular;  Laterality: N/A;  . implantable loop recorder implantation  10/18/2019   Medtronic Reveal [place] model [id number] ([id number]) implantable loop recorder   . LAPAROSCOPIC CHOLECYSTECTOMY    . REDUCTION MAMMAPLASTY Bilateral   . TONSILLECTOMY    . TOTAL HIP ARTHROPLASTY Right 05/12/2018   Procedure: TOTAL HIP ARTHROPLASTY ANTERIOR APPROACH;  Surgeon: Paralee Cancel, MD;  Location: [facility];  Service: Orthopedics;  Laterality: Right;  70 mins    ROS- all systems are reviewed and negatives except as per HPI above  Current Outpatient Medications  Medication Sig  Dispense Refill  . atorvastatin (LIPITOR) 10 MG tablet TAKE 1 TABLET BY MOUTH EVERYDAY AT BEDTIME 90 tablet 2  . calcium carbonate (OS-CAL - DOSED IN MG OF ELEMENTAL CALCIUM) 1250 (500 Ca) MG tablet Take 1 tablet by mouth daily with breakfast.    . ELIQUIS 5 MG TABS tablet Take 5 mg by mouth 2 (two) times daily.    [name] escitalopram (LEXAPRO) 10 MG tablet Take 1 tablet by mouth once  daily 90 tablet 1  . flecainide (TAMBOCOR) 100 MG tablet Take 0.5 tablets (50 mg total) by mouth 2 (two) times daily. 90 tablet 3  . hydroxypropyl methylcellulose / hypromellose (ISOPTO TEARS / GONIOVISC) 2.5 % ophthalmic solution Place 1 drop into both eyes 2 (two) times daily as needed for dry eyes.    . metFORMIN (GLUCOPHAGE-XR) 500 MG 24 hr tablet TAKE 2 TABLETS (1,000 MG TOTAL) BY MOUTH 2 (TWO) TIMES DAILY WITH A MEAL. 360 tablet 1  . metoprolol tartrate (LOPRESSOR) 25 MG tablet Take 1 tablet (25 mg total) by mouth 2 (two) times daily. 30 tablet 2  . ONETOUCH ULTRA test strip CHECK BLOOD SUGAR 2 TIMES A DAY AS DIRECTED DX CODE E11.9 100 strip 7  . solifenacin (VESICARE) 10 MG tablet Take 10 mg by mouth daily.    . TRADJENTA 5 MG TABS tablet TAKE 1 TABLET BY MOUTH EVERY DAY 30 tablet 11  . traZODone (DESYREL) 50 MG tablet TAKE 1 TABLET (50 MG TOTAL) BY MOUTH AT BEDTIME AS NEEDED FOR SLEEP. 30 tablet 3   No current facility-administered medications for this visit.    Physical Exam: Vitals:   01/19/20 1424  Weight: 176 lb (79.8 kg)  Height: 5\' 5"  (1.651 m)    GEN- The patient is well appearing, alert and oriented x 3 today.   Head- normocephalic, atraumatic Eyes-  Sclera clear, conjunctiva pink Ears- hearing intact Oropharynx- clear Lungs- Clear to ausculation bilaterally, normal work of breathing Heart- Regular rate and rhythm, no murmurs, rubs or gallops, PMI not laterally displaced GI- soft, NT, ND, + BS Extremities- no clubbing, cyanosis, or edema  Wt Readings from Last 3 Encounters:  01/19/20 176 lb (79.8 kg)  12/09/19 174 lb 6 oz (79.1 kg)  11/18/19 174 lb (78.9 kg)    EKG tracing ordered today is personally reviewed and shows sinus bradycardia  Assessment and Plan:  1. Afib/ atach She has sevee LA enlargement but has done amazingly well post ablation.  Her af burden by ILR is 1%.  Many of these events appear to be sinus with PACs rather than actual afib. chads2vasc  score is 4.  She is on eliquis She is on flecainide.  We will follow her closely on this medicine to avoid toxicity  2. HTN Stable No change required today  3. OSA Does not use CPAP  4. Moderate MR Consider TEE to further evaluate Dr Fletcher Anon is following  5. Recurrent falls/ possible syncope No arrhythmias by ILR to discuss  Risks, benefits and potential toxicities for medications prescribed and/or refilled reviewed with patient today.   Return to see me in 9 months  Thompson Grayer MD, [facility] 01/19/2020 2:26 PM

## 2020-01-30 LAB — CUP PACEART REMOTE DEVICE CHECK
Date Time Interrogation Session: 20211127205823
Implantable Pulse Generator Implant Date: 20210816

## 2020-01-31 ENCOUNTER — Ambulatory Visit (INDEPENDENT_AMBULATORY_CARE_PROVIDER_SITE_OTHER): Payer: Medicare HMO

## 2020-01-31 DIAGNOSIS — I4819 Other persistent atrial fibrillation: Secondary | ICD-10-CM | POA: Diagnosis not present

## 2020-02-01 DIAGNOSIS — E785 Hyperlipidemia, unspecified: Secondary | ICD-10-CM | POA: Diagnosis not present

## 2020-02-01 DIAGNOSIS — I471 Supraventricular tachycardia: Secondary | ICD-10-CM | POA: Diagnosis not present

## 2020-02-01 DIAGNOSIS — D6869 Other thrombophilia: Secondary | ICD-10-CM | POA: Diagnosis not present

## 2020-02-01 DIAGNOSIS — R69 Illness, unspecified: Secondary | ICD-10-CM | POA: Diagnosis not present

## 2020-02-01 DIAGNOSIS — G4700 Insomnia, unspecified: Secondary | ICD-10-CM | POA: Diagnosis not present

## 2020-02-01 DIAGNOSIS — E119 Type 2 diabetes mellitus without complications: Secondary | ICD-10-CM | POA: Diagnosis not present

## 2020-02-01 DIAGNOSIS — E663 Overweight: Secondary | ICD-10-CM | POA: Diagnosis not present

## 2020-02-01 DIAGNOSIS — D649 Anemia, unspecified: Secondary | ICD-10-CM | POA: Diagnosis not present

## 2020-02-01 DIAGNOSIS — I4891 Unspecified atrial fibrillation: Secondary | ICD-10-CM | POA: Diagnosis not present

## 2020-02-07 DIAGNOSIS — G4733 Obstructive sleep apnea (adult) (pediatric): Secondary | ICD-10-CM | POA: Diagnosis not present

## 2020-02-07 NOTE — Progress Notes (Signed)
Carelink Summary Report / Loop Recorder 

## 2020-02-10 ENCOUNTER — Other Ambulatory Visit: Payer: Self-pay

## 2020-02-10 ENCOUNTER — Ambulatory Visit (INDEPENDENT_AMBULATORY_CARE_PROVIDER_SITE_OTHER): Payer: Medicare HMO

## 2020-02-10 DIAGNOSIS — Z23 Encounter for immunization: Secondary | ICD-10-CM

## 2020-02-15 ENCOUNTER — Other Ambulatory Visit: Payer: Self-pay | Admitting: Cardiovascular Disease

## 2020-02-15 ENCOUNTER — Other Ambulatory Visit: Payer: Self-pay | Admitting: Internal Medicine

## 2020-02-15 NOTE — Telephone Encounter (Signed)
Rx request sent to pharmacy.  

## 2020-02-22 DIAGNOSIS — R69 Illness, unspecified: Secondary | ICD-10-CM | POA: Diagnosis not present

## 2020-03-06 ENCOUNTER — Ambulatory Visit (INDEPENDENT_AMBULATORY_CARE_PROVIDER_SITE_OTHER): Payer: Medicare HMO

## 2020-03-06 DIAGNOSIS — I4819 Other persistent atrial fibrillation: Secondary | ICD-10-CM

## 2020-03-07 LAB — CUP PACEART REMOTE DEVICE CHECK
Date Time Interrogation Session: 20211230205855
Implantable Pulse Generator Implant Date: 20210816

## 2020-03-08 DIAGNOSIS — G4733 Obstructive sleep apnea (adult) (pediatric): Secondary | ICD-10-CM | POA: Diagnosis not present

## 2020-03-08 DIAGNOSIS — G4730 Sleep apnea, unspecified: Secondary | ICD-10-CM | POA: Diagnosis not present

## 2020-03-09 DIAGNOSIS — G4733 Obstructive sleep apnea (adult) (pediatric): Secondary | ICD-10-CM | POA: Diagnosis not present

## 2020-03-21 NOTE — Progress Notes (Signed)
Carelink Summary Report / Loop Recorder 

## 2020-03-23 ENCOUNTER — Other Ambulatory Visit: Payer: Self-pay | Admitting: Nurse Practitioner

## 2020-03-23 NOTE — Telephone Encounter (Signed)
Eliquis 5mg  refill request received. Patient is 75 years old, weight-79.8kg, Crea-0.97 on 09/17/2019, Diagnosis-Afib, and last seen by Dr. Rayann Heman on 01/19/2020. Dose is appropriate based on dosing criteria. Will send in refill to requested pharmacy.

## 2020-03-27 ENCOUNTER — Other Ambulatory Visit: Payer: Self-pay | Admitting: Family Medicine

## 2020-04-05 LAB — CUP PACEART REMOTE DEVICE CHECK
Date Time Interrogation Session: 20220201210304
Implantable Pulse Generator Implant Date: 20210816

## 2020-04-08 DIAGNOSIS — G4733 Obstructive sleep apnea (adult) (pediatric): Secondary | ICD-10-CM | POA: Diagnosis not present

## 2020-04-08 DIAGNOSIS — G4730 Sleep apnea, unspecified: Secondary | ICD-10-CM | POA: Diagnosis not present

## 2020-04-09 DIAGNOSIS — G4733 Obstructive sleep apnea (adult) (pediatric): Secondary | ICD-10-CM | POA: Diagnosis not present

## 2020-04-10 ENCOUNTER — Ambulatory Visit (INDEPENDENT_AMBULATORY_CARE_PROVIDER_SITE_OTHER): Payer: Medicare HMO

## 2020-04-10 DIAGNOSIS — I4819 Other persistent atrial fibrillation: Secondary | ICD-10-CM

## 2020-04-11 ENCOUNTER — Other Ambulatory Visit: Payer: Medicare HMO

## 2020-04-11 ENCOUNTER — Other Ambulatory Visit: Payer: Self-pay | Admitting: Family Medicine

## 2020-04-11 ENCOUNTER — Other Ambulatory Visit: Payer: Self-pay | Admitting: Pulmonary Disease

## 2020-04-12 NOTE — Telephone Encounter (Signed)
Dr. Halford Chessman, please advise if you are okay refilling med.

## 2020-04-17 NOTE — Progress Notes (Signed)
Carelink Summary Report / Loop Recorder 

## 2020-04-24 ENCOUNTER — Ambulatory Visit (INDEPENDENT_AMBULATORY_CARE_PROVIDER_SITE_OTHER): Payer: Medicare HMO

## 2020-04-24 ENCOUNTER — Other Ambulatory Visit: Payer: Self-pay

## 2020-04-24 DIAGNOSIS — I340 Nonrheumatic mitral (valve) insufficiency: Secondary | ICD-10-CM

## 2020-04-25 LAB — ECHOCARDIOGRAM COMPLETE
Area-P 1/2: 3.53 cm2
Calc EF: 64.1 %
MV M vel: 5.42 m/s
MV Peak grad: 117.5 mmHg
Radius: 0.8 cm
S' Lateral: 4 cm
Single Plane A2C EF: 56.8 %
Single Plane A4C EF: 69.6 %

## 2020-05-06 DIAGNOSIS — G4733 Obstructive sleep apnea (adult) (pediatric): Secondary | ICD-10-CM | POA: Diagnosis not present

## 2020-05-06 DIAGNOSIS — G4730 Sleep apnea, unspecified: Secondary | ICD-10-CM | POA: Diagnosis not present

## 2020-05-07 DIAGNOSIS — G4733 Obstructive sleep apnea (adult) (pediatric): Secondary | ICD-10-CM | POA: Diagnosis not present

## 2020-05-15 ENCOUNTER — Ambulatory Visit (INDEPENDENT_AMBULATORY_CARE_PROVIDER_SITE_OTHER): Payer: Medicare HMO

## 2020-05-15 DIAGNOSIS — I4819 Other persistent atrial fibrillation: Secondary | ICD-10-CM | POA: Diagnosis not present

## 2020-05-17 LAB — CUP PACEART REMOTE DEVICE CHECK
Date Time Interrogation Session: 20220306205949
Implantable Pulse Generator Implant Date: 20210816

## 2020-05-18 ENCOUNTER — Ambulatory Visit: Payer: Medicare HMO | Admitting: Internal Medicine

## 2020-05-18 ENCOUNTER — Other Ambulatory Visit: Payer: Self-pay

## 2020-05-18 ENCOUNTER — Encounter: Payer: Self-pay | Admitting: Internal Medicine

## 2020-05-18 VITALS — BP 128/82 | HR 56 | Ht 65.0 in | Wt 171.8 lb

## 2020-05-18 DIAGNOSIS — E063 Autoimmune thyroiditis: Secondary | ICD-10-CM | POA: Diagnosis not present

## 2020-05-18 DIAGNOSIS — E119 Type 2 diabetes mellitus without complications: Secondary | ICD-10-CM | POA: Diagnosis not present

## 2020-05-18 DIAGNOSIS — E7800 Pure hypercholesterolemia, unspecified: Secondary | ICD-10-CM | POA: Diagnosis not present

## 2020-05-18 LAB — POCT GLYCOSYLATED HEMOGLOBIN (HGB A1C): Hemoglobin A1C: 6.1 % — AB (ref 4.0–5.6)

## 2020-05-18 NOTE — Progress Notes (Signed)
Patient ID: [name], female   DOB: 1945-10-24, 75 y.o.   MRN: [id number]  This visit occurred during the SARS-CoV-2 public health emergency.  Safety protocols were in place, including screening questions prior to the visit, additional usage of staff PPE, and extensive cleaning of exam room while observing appropriate contact time as indicated for disinfecting solutions.   HPI: [name] is a 75 y.o.-year-old female, presenting for f/u for DM2, dx 1995, insulin-dependent since 2014 - now off insulin since 12/2016, with improved control, without long term complications. Last visit 6 months ago.  Interim history: She continues to be seen by cardiology frequently for her atrial fibrillation. At today's visit, she does not have any complaints: No nausea, increased urination, weight changes, blurry vision.  Reviewed HbA1c levels: Lab Results  Component Value Date   HGBA1C 6.5 (A) 11/18/2019   HGBA1C 6.2 (A) 05/17/2019   HGBA1C 6.1 (A) 11/17/2018   HGBA1C 6.3 (H) 05/07/2018   HGBA1C 6.2 (A) 04/16/2018   HGBA1C 6.4 (A) 12/01/2017   HGBA1C 6.7 (A) 07/25/2017   HGBA1C 6.7 03/27/2017   HGBA1C 6.4 (H) 11/20/2016   HGBA1C 6.9 09/16/2016  01/25/2019: HbA1c 6.3%  Pt is on a regimen of: - Metformin ER 1000 mg twice a day with meals.  She tried to take the entire dose with dinner but this was not as effective. - Tradjenta 5 mg before lunch She was on Basaglar 12 >> 14 units in hs >> stopped at last visit - but still takes it seldom (10-12 units)  Pt checks her sugars twice a day: - am: 100-127 >> 90-120 >> 90-150 >> 79-120, 140 >> 90-120 - 2h after b'fast: 90-103 >> 113-141 >> n/c - before lunch:90-130 >> 100-125, 199 >> n/c >> 80-120 >> 90-120 - 2h after lunch: 140-160s >> n/c  >> 140-180 >> n/c  - before dinner: n/c >> 102-144 >> n/c >> 80 >> n/c - 2h after dinner:  n/c >> 160-180, 200 >> 150-180, 220 >> 120-160 - bedtime: 120-160 >> 140-160 >> n/c  - nighttime: n/c >> 90 >>  n/c Lowest sugar was 80 (after working outside) >> 90 >> 80s; she has hypoglycemia awareness in the 80s. Highest sugar was 200 >> 220 (sweets) >> 160.  Pt's meals are: - Breakfast: cereals + 2% milk; bread + cheese; bread + PB - Lunch: sandwich; soup - Dinner: chicken; steak; seafood - Snacks: 2: PB crackers  -No CKD, last BUN/creatinine:  Lab Results  Component Value Date   BUN 13 09/17/2019   CREATININE 0.97 09/17/2019  Not on ACE inhibitor/ARB. -+ HL; last set of lipids: Lab Results  Component Value Date   CHOL 145 08/27/2019   HDL 49.10 08/27/2019   LDLCALC 85 08/27/2019   LDLDIRECT 163.1 07/04/2011   TRIG 52.0 08/27/2019   CHOLHDL 3 08/27/2019  01/25/2019: 165/60/55/98 On Lipitor 10. - last eye exam was in 2021: No DR. Dry eyes (got drops). [name]  She had cataract surgery in 04 and 07/2016.  In 03/2018 she had cataract surgery at [facility]. -No numbness and tingling in her feet.  She has persistent A. fib>> she had several cardioversions. On Flecainide, Diltiazem, Eliquis. She was previously on Vesicare but had to stop due to cost.  She started [place], but this is not working as well. 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 She has a history of sciatica for which she had prednisone in the past.   Her TSH levels have  been normal, but she does have high antithyroid antibodies Lab Results  Component Value Date   TSH 1.728 07/26/2019   TSH 2.719 11/18/2016   TSH 3.402 01/22/2016   TSH 2.885 09/24/2013   No results found for: FREET4  No results found for: T3FREE  01/25/2019: TSH 4.26 (0.45-4.5), total T4 5.6 (4.5-12), Free T4 0.93 (0.82-1.77), free T3 2.6 (2-4.4), TPO antibodies 191 (0-34), thyroglobulin antibodies 21.6 (0-0.9)  Pt denies: - feeling nodules in neck - hoarseness - dysphagia - choking - SOB with lying down  No FH of thyroid ds. No FH of thyroid cancer. No h/o radiation tx to head or neck.  No herbal supplements. No Biotin  use. No recent steroids use.   ROS: Constitutional: no weight gain/no weight loss, no fatigue, no subjective hyperthermia, no subjective hypothermia Eyes: no blurry vision, no xerophthalmia ENT: no sore throat, + see HPI Cardiovascular: no CP/no SOB/no palpitations/no leg swelling Respiratory: no cough/no SOB/no wheezing Gastrointestinal: no N/no V/no D/no C/no acid reflux Musculoskeletal: no muscle aches/no joint aches Skin: no rashes, no hair loss Neurological: no tremors/no numbness/no tingling/no dizziness  I reviewed pt's medications, allergies, PMH, social hx, family hx, and changes were documented in the history of present illness. Otherwise, unchanged from my initial visit note.  Past Medical History:  Diagnosis Date  . Anemia   . Anxiety   . Atrial fibrillation ([place])   . Colon polyps   . Constipation   . Depressive disorder, not elsewhere classified   . Diabetes mellitus without complication ([place])   . Diaphragmatic hernia without mention of obstruction or gangrene   . Disorders of bursae and tendons in shoulder region, unspecified   . Diverticulosis of colon (without mention of hemorrhage)   . External hemorrhoid   . Fundic gland polyps of stomach, benign   . GERD (gastroesophageal reflux disease)   . History of hiatal hernia   . HTN (hypertension)   . Hypertension   . Hypothyroidism   . Internal hemorrhoids without mention of complication   . Moderate mitral regurgitation   . Obesity, unspecified   . OSA on CPAP    compliant with CPAP  . Other urinary incontinence   . Persistent atrial fibrillation ([place])   . Pure hypercholesterolemia   . Type II diabetes mellitus ([place])   . Urinary frequency   . Urinary urgency   . Vitamin B 12 deficiency    Past Surgical History:  Procedure Laterality Date  . ABDOMINAL HYSTERECTOMY    . ABDOMINAL HYSTERECTOMY  1972   Have both ovaries  . ATRIAL FIBRILLATION ABLATION  12/17/2016  . ATRIAL FIBRILLATION ABLATION N/A  12/17/2016   Procedure: ATRIAL FIBRILLATION ABLATION;  Surgeon: Thompson Grayer, MD;  Location: [place] [facility];  Service: Cardiovascular;  Laterality: N/A;  . [place]   [facility]; "Dr. [name]"  . CARDIOVERSION N/A 05/27/2016   Procedure: CARDIOVERSION;  Surgeon: [name], MD;  Location: [facility];  Service: Cardiovascular;  Laterality: N/A;  . CARDIOVERSION N/A 11/25/2016   Procedure: CARDIOVERSION;  Surgeon: [name], MD;  Location: [facility];  Service: Cardiovascular;  Laterality: N/A;  . CARDIOVERSION N/A 09/18/2018   Procedure: CARDIOVERSION;  Surgeon: [name], MD;  Location: [place] [facility];  Service: Cardiovascular;  Laterality: N/A;  . CARDIOVERSION N/A 07/28/2019   Procedure: CARDIOVERSION;  Surgeon: Kate Sable, MD;  Location: [facility];  Service: Cardiovascular;  Laterality: N/A;  . CARDIOVERSION N/A 09/07/2019   Procedure: CARDIOVERSION;  Surgeon: Nelva Bush,  MD;  Location: [facility];  Service: Cardiovascular;  Laterality: N/A;  . CATARACT EXTRACTION W/ INTRAOCULAR LENS  IMPLANT, BILATERAL Bilateral   . COLONOSCOPY    . ELECTROPHYSIOLOGIC STUDY N/A 01/24/2016   Procedure: CARDIOVERSION;  Surgeon: [name], MD;  Location: [facility];  Service: Cardiovascular;  Laterality: N/A;  . implantable loop recorder implantation  10/18/2019   Medtronic Reveal [place] model [id number] ([id number]) implantable loop recorder   . LAPAROSCOPIC CHOLECYSTECTOMY    . REDUCTION MAMMAPLASTY Bilateral   . TONSILLECTOMY    . TOTAL HIP ARTHROPLASTY Right 05/12/2018   Procedure: TOTAL HIP ARTHROPLASTY ANTERIOR APPROACH;  Surgeon: Paralee Cancel, MD;  Location: [facility];  Service: Orthopedics;  Laterality: Right;  70 mins   Social History   Socioeconomic History  . Marital status: Married    Spouse name: Laverna Peace  . Number of children: 2  . Years of education: Not on file  . Highest education level: Not on file  Occupational History  . Occupation: retired     Fish farm manager: UNEMPLOYED  Tobacco Use  . Smoking status: Never Smoker  . Smokeless tobacco: Never Used  Vaping Use  . Vaping Use: Never used  Substance and Sexual Activity  . Alcohol use: No    Alcohol/week: 0.0 standard drinks  . Drug use: No  . Sexual activity: Yes  Other Topics Concern  . Not on file  Social History Narrative   ** Merged History Encounter **       Exercise walks 3 times daily Lives with spouse in [place] - retired from employment 1 son and 1 daughter  Diet fruit, occ fast food, salads and lean meat  3 tea/day, no EtOH, no tobacco, no drugs    Social Determinants of Health   Financial Resource Strain: Low Risk   . Difficulty of Paying Living Expenses: Not hard at all  Food Insecurity: No Food Insecurity  . Worried About Charity fundraiser in the Last Year: Never true  . Ran Out of Food in the Last Year: Never true  Transportation Needs: No Transportation Needs  . Lack of Transportation (Medical): No  . Lack of Transportation (Non-Medical): No  Physical Activity: Inactive  . Days of Exercise per Week: 0 days  . Minutes of Exercise per Session: 0 min  Stress: Stress Concern Present  . Feeling of Stress : To some extent  Social Connections: Not on file  Intimate Partner Violence: Not At Risk  . Fear of Current or Ex-Partner: No  . Emotionally Abused: No  . Physically Abused: No  . Sexually Abused: No   Current Outpatient Medications on File Prior to Visit  Medication Sig Dispense Refill  . atorvastatin (LIPITOR) 10 MG tablet TAKE 1 TABLET BY MOUTH EVERYDAY AT BEDTIME 90 tablet 2  . calcium carbonate (OS-CAL - DOSED IN MG OF ELEMENTAL CALCIUM) 1250 (500 Ca) MG tablet Take 1 tablet by mouth daily with breakfast.    . ELIQUIS 5 MG TABS tablet TAKE 1 TABLET BY MOUTH TWICE A DAY 60 tablet 9  . escitalopram (LEXAPRO) 10 MG tablet Take 1 tablet by mouth once daily 90 tablet 1  . flecainide (TAMBOCOR) 100 MG tablet Take 0.5 tablets (50 mg total)  by mouth 2 (two) times daily. 90 tablet 3  . hydroxypropyl methylcellulose / hypromellose (ISOPTO TEARS / GONIOVISC) 2.5 % ophthalmic solution Place 1 drop into both eyes 2 (two) times daily as needed for dry eyes.    . metFORMIN (GLUCOPHAGE-XR) 500 MG 24  hr tablet TAKE 2 TABLETS (1,000 MG TOTAL) BY MOUTH 2 (TWO) TIMES DAILY WITH A MEAL. 360 tablet 1  . metoprolol tartrate (LOPRESSOR) 25 MG tablet TAKE 1 TABLET BY MOUTH TWICE A DAY 180 tablet 1  . ONETOUCH ULTRA test strip CHECK BLOOD SUGAR 2 TIMES A DAY AS DIRECTED DX CODE E11.9 100 strip 7  . solifenacin (VESICARE) 10 MG tablet TAKE 1 TABLET BY MOUTH EVERY DAY 30 tablet 5  . TRADJENTA 5 MG TABS tablet TAKE 1 TABLET BY MOUTH EVERY DAY 30 tablet 11  . traZODone (DESYREL) 50 MG tablet TAKE 1 TABLET BY MOUTH AT BEDTIME AS NEEDED FOR SLEEP. 30 tablet 3   No current facility-administered medications on file prior to visit.   Allergies  Allergen Reactions  . Cortisone Shortness Of Breath and Rash  . Pravachol [Pravastatin Sodium]     Leg pain   . Contrast Media [Iodinated Diagnostic Agents] Rash   Family History  Problem Relation Age of Onset  . Cancer Mother        fallopian tube  . Dementia Mother   . Pulmonary embolism Mother   . Hyperlipidemia Mother   . Diabetes Mother   . Hyperlipidemia Father   . Lung cancer Father   . Hypertension Sister   . Diabetes Brother   . Cancer Brother   . Stroke Paternal Grandmother   . Stroke Paternal Grandfather   . Colon cancer Neg Hx   . Esophageal cancer Neg Hx     PE: BP 128/82 (BP Location: Right Arm, Patient Position: Sitting, Cuff Size: Normal)   Pulse (!) 56   Ht 5\' 5"  (1.651 m)   Wt 171 lb 12.8 oz (77.9 kg)   LMP  (LMP Unknown)   SpO2 97%   BMI 28.59 kg/m  Body mass index is 28.59 kg/m. Wt Readings from Last 3 Encounters:  05/18/20 171 lb 12.8 oz (77.9 kg)  01/19/20 176 lb (79.8 kg)  12/09/19 174 lb 6 oz (79.1 kg)   Constitutional: overweight, in NAD Eyes: PERRLA, EOMI, no  exophthalmos ENT: moist mucous membranes, no thyromegaly, no cervical lymphadenopathy Cardiovascular: RRR, No MRG Respiratory: CTA B Gastrointestinal: abdomen soft, NT, ND, BS+ Musculoskeletal: no deformities, strength intact in all 4 Skin: moist, warm, no rashes Neurological: no tremor with outstretched hands, DTR normal in all 4  ASSESSMENT: 1. DM2, previously insulin-dependent, controlled, without long term complications, but with hyperglycemia - she was contemplating gastric sleeve sx >> on hold now  2. Hyperlipidemia  3. Euthyroid Hashimoto's thyroiditis  PLAN:  1. Patient with longstanding, now controlled, initially insulin-dependent diabetes, now off insulin after she started to improve her diet and lose weight.  She is on Metformin ER and DPP 4 inhibitor, tolerated well.  At last visit, sugars are all at goal with exception of a morning blood sugar at 148 after she had chocolate milk the night before.  At that time HbA1c was excellent at 6.5% and we did not change her regimen. -At today's visit, she does not bring a blood sugar log or meter.  Per her recall, sugars are all at goal, improved at night after dinner.  Therefore, for now, we will continue the current regimen.  She is tolerating both Metformin and Tradjenta well. - I suggested to:  Patient Instructions  Please continue: - Metformin ER 1000 mg 2x a day with meals - Tradjenta 5 mg before lunch  Please come back for a follow-up appointment in 4-6 months.  - we checked  her HbA1c: 6.1% (better) - advised to check sugars at different times of the day - 1x a day, rotating check times - advised for yearly eye exams >> she is UTD - return to clinic in 4-6 months  2.  Hyperlipidemia - Reviewed latest lipid panel from 08/2019: Fractions at goal: Lab Results  Component Value Date   CHOL 145 08/27/2019   HDL 49.10 08/27/2019   LDLCALC 85 08/27/2019   LDLDIRECT 163.1 07/04/2011   TRIG 52.0 08/27/2019   CHOLHDL 3  08/27/2019  -Continues Lipitor 10 without side effects  3.  Hashimoto's thyroiditis -Her antithyroid antibodies were elevated at last check -However, her TSH was normal Lab Results  Component Value Date   TSH 1.728 07/26/2019  -She does not require levothyroxine -No neck compression symptoms or hypothyroid symptoms -At last visit, she had friable nails and I advised her that she can restart taking the hair skin and nails vitamin - she was off in preparation for TFT check -She will have a new TSH at next visit with PCP in June.  I advised her to stay off her hair skin and nails vitamins for several days before these labs.  [name], MD PhD [facility] Endocrinology

## 2020-05-18 NOTE — Patient Instructions (Signed)
Please continue: - Metformin ER 1000 mg 2x a day with meals - Tradjenta 5 mg before lunch  Please come back for a follow-up appointment in 4-6 months.

## 2020-05-19 NOTE — Progress Notes (Signed)
Cardiology Office Note    Date:  05/25/2020   ID:  [name], DOB [birth year], MRN [id number]  PCP:  Jinny Sanders, MD  Cardiologist:  Kathlyn Sacramento, MD  Electrophysiologist:  Thompson Grayer, MD   Chief Complaint: Follow up  History of Present Illness:   [name] is a 75 y.o. female with history of persistent A. fib on Eliquis status post cardioversion in 01/2016,05/2016, and 9/2018status post radiofrequency catheter ablation on 10/16/2018s/p repeat DCCV 7/6/2021followed by EP/A. fib clinic,atrial tachycardia s/p DCCV 09/18/2018,pulmonary hypertension, mitral valve prolapse with mitral regurgitation, DM2,COVID-19 in 02/2019,hypertension, hyperlipidemia, hypothyroidism, anemia, obesity, OSA on CPAP, diverticulosis, GERD, and anxiety who presents forfollow up of A. fib.  Prior cardiac cath in 12/2008 for atypical chest pain showed no significant CAD with mildly elevated LVEDP and normal EF.She was initially diagnosed with A. fib in 09/2013 and was quite symptomatic with this. Echo at that time showed normal LV systolic function withmild mitral regurgitation, moderately dilated left atrium, and mild to moderate pulmonary hypertension. Given it was felt she would likely need antiarrhythmic therapy due to symptoms,nuclear stress test in 09/2013 was undertaken and was normal. With recurrence of A. fib, she was subsequently placed on flecainideand underwent cardioversion in 01/2016. She had recurrent A. fib in 05/2016 and underwent repeat successful cardioversion at that time. She was referred to the A. fib clinic. She had recurrent A. fib in 11/2016 and underwent a third cardioversion. Shethenunderwent radiofrequency catheter ablation on 12/17/2016. She was seen on 09/15/2018 notinga 4 to 5-day history of mild palpitations that feltdifferent than her prior episodes of A. fibalong with somebrief episodesof dizziness. After review with EP, she was noted to be in atrial  tachycardia with long cycle Mobitz type I AV block. Her flecainide was increased to 50 mg bid and she underwent successful DCCV on 09/18/2018. She was diagnosed with COVID-19 in 02/2019, not requiring hospital admissionand received mAb infusion.In follow up with the Afib clinic in 04/2019, she was maintaining sinus rhythm. Echo in 04/2019 showed an EF of 55 to 60%, mildly dilated left ventricular cavity, no regional wall motionabnormalities, normal RV systolic function and ventricular cavity size, severely dilated left atrium measuring 55 mm, mild mitral regurgitation, mild aortic valve sclerosis without evidence of stenosis.She had recurrent Afib in 07/2019, and was scheduled for DCCV. She converted to sinus rhythm prior to this being completed. At thattime, her flecainide was titrated to 100 mg bid. She was seen by Dr. Fletcher Anon on 09/02/2019, and was back in symptomatic Afib with noted dizziness, fatigue, and a syncopal episode. Her diltiazem was changed to metoprolol and she subsequently underwent successful DCCV on 09/07/2019 to sinus bradycardia. Her moderate mitral regurgitation was not felt to be driving her Afib. With her severely dilated left atrium, it was felt it may be difficult to keep her in sinus rhythm. She was re-referred to EP in this setting.She was seen in follow up on 09/17/2019 and was doing well, and maintaining sinus rhythm. She was mildly bradycardic with a heart rate of 54 bpm, though was asymptomatic. BP was 140/60. She was seen in the office on 10/01/2019 for evaluation of dizziness with possible transient syncope which led to the tapering of her flecainide to 50 mg bid with improvement in symptoms.  She was seen in follow up by Dr. Rayann Heman on 10/18/2019, and was doing well.  With regards to her Afib burden and symptoms, ILR was implanted.  She was seen in this office in 12/2019 by  Dr. Fletcher Anon and was doing well, without persistent Afib episodes noted on ILR.  She was seen by Dr.  Rayann Heman in 01/2020 with a < 1% Afib burden noted on ILR, with many episodes felt to appear to be sinus with PACs rather than actual Afib.  Repeat echo in 04/2020, to trend her mitral regurgitation, showed an EF of 55-60%, no RWMA, mildly dilated LV cavity size, normal RVSF and ventricular cavity size, PASP 49 mmHg, severely dilated left atrium, moderate mitral regurgitation, mild aortic sclerosis without stenosis, and a right atrial pressure of 3 mmHg.   She comes in doing well from a cardiac perspective.  She does continue to note positional changes such as bending over or standing up.  She drinks approximately 32 ounces of water as well as 32 ounces of tea throughout the day.  She has not had any further symptoms consistent with her prior episodes of A. fib.  No further syncopal episodes.  She is tolerating Eliquis without falls, hematochezia, melena, hemoptysis, hematemesis, or hematuria.  No lower extremity swelling.   Labs independently reviewed: 05/2020 - A1c 6.1 09/2019 - HGB 10.5, PLT 318, BUN 13, SCr 0.97, potassium 5.2, magnesium 1.8 08/2019 - TC 145, TG 52, HDL 49, LDL 85, albumin 4.1, AST/ALT normal 07/2019 - TSH normal  Past Medical History:  Diagnosis Date  . Anemia   . Anxiety   . Atrial fibrillation ([place])   . Colon polyps   . Constipation   . Depressive disorder, not elsewhere classified   . Diabetes mellitus without complication ([place])   . Diaphragmatic hernia without mention of obstruction or gangrene   . Disorders of bursae and tendons in shoulder region, unspecified   . Diverticulosis of colon (without mention of hemorrhage)   . External hemorrhoid   . Fundic gland polyps of stomach, benign   . GERD (gastroesophageal reflux disease)   . History of hiatal hernia   . HTN (hypertension)   . Hypertension   . Hypothyroidism   . Internal hemorrhoids without mention of complication   . Moderate mitral regurgitation   . Obesity, unspecified   . OSA on CPAP    compliant with  CPAP  . Other urinary incontinence   . Persistent atrial fibrillation ([place])   . Pure hypercholesterolemia   . Type II diabetes mellitus ([place])   . Urinary frequency   . Urinary urgency   . Vitamin B 12 deficiency     Past Surgical History:  Procedure Laterality Date  . ABDOMINAL HYSTERECTOMY    . ABDOMINAL HYSTERECTOMY  1972   Have both ovaries  . ATRIAL FIBRILLATION ABLATION  12/17/2016  . ATRIAL FIBRILLATION ABLATION N/A 12/17/2016   Procedure: ATRIAL FIBRILLATION ABLATION;  Surgeon: Thompson Grayer, MD;  Location: [facility];  Service: Cardiovascular;  Laterality: N/A;  . [place]   [facility]; "Dr. [name]"  . CARDIOVERSION N/A 05/27/2016   Procedure: CARDIOVERSION;  Surgeon: [name], MD;  Location: [facility];  Service: Cardiovascular;  Laterality: N/A;  . CARDIOVERSION N/A 11/25/2016   Procedure: CARDIOVERSION;  Surgeon: [name], MD;  Location: [facility];  Service: Cardiovascular;  Laterality: N/A;  . CARDIOVERSION N/A 09/18/2018   Procedure: CARDIOVERSION;  Surgeon: [name], MD;  Location: [facility];  Service: Cardiovascular;  Laterality: N/A;  . CARDIOVERSION N/A 07/28/2019   Procedure: CARDIOVERSION;  Surgeon: Kate Sable, MD;  Location: [facility];  Service: Cardiovascular;  Laterality: N/A;  . CARDIOVERSION N/A 09/07/2019   Procedure: CARDIOVERSION;  Surgeon: Nelva Bush, MD;  Location: [facility];  Service: Cardiovascular;  Laterality: N/A;  . CATARACT EXTRACTION W/ INTRAOCULAR LENS  IMPLANT, BILATERAL Bilateral   . COLONOSCOPY    . ELECTROPHYSIOLOGIC STUDY N/A 01/24/2016   Procedure: CARDIOVERSION;  Surgeon: [name], MD;  Location: [facility];  Service: Cardiovascular;  Laterality: N/A;  . implantable loop recorder implantation  10/18/2019   Medtronic Reveal [place] model [id number] ([id number]) implantable loop recorder   . LAPAROSCOPIC CHOLECYSTECTOMY    . REDUCTION MAMMAPLASTY Bilateral   . TONSILLECTOMY    . TOTAL HIP  ARTHROPLASTY Right 05/12/2018   Procedure: TOTAL HIP ARTHROPLASTY ANTERIOR APPROACH;  Surgeon: Paralee Cancel, MD;  Location: [facility];  Service: Orthopedics;  Laterality: Right;  70 mins    Current Medications: Current Meds  Medication Sig  . atorvastatin (LIPITOR) 10 MG tablet TAKE 1 TABLET BY MOUTH EVERYDAY AT BEDTIME  . calcium carbonate (OS-CAL - DOSED IN MG OF ELEMENTAL CALCIUM) 1250 (500 Ca) MG tablet Take 1 tablet by mouth daily with breakfast.  . ELIQUIS 5 MG TABS tablet TAKE 1 TABLET BY MOUTH TWICE A DAY  . escitalopram (LEXAPRO) 10 MG tablet Take 1 tablet by mouth once daily  . flecainide (TAMBOCOR) 100 MG tablet Take 0.5 tablets (50 mg total) by mouth 2 (two) times daily.  . hydroxypropyl methylcellulose / hypromellose (ISOPTO TEARS / GONIOVISC) 2.5 % ophthalmic solution Place 1 drop into both eyes 2 (two) times daily as needed for dry eyes.  . metFORMIN (GLUCOPHAGE-XR) 500 MG 24 hr tablet TAKE 2 TABLETS (1,000 MG TOTAL) BY MOUTH 2 (TWO) TIMES DAILY WITH A MEAL.  . metoprolol tartrate (LOPRESSOR) 25 MG tablet TAKE 1 TABLET BY MOUTH TWICE A DAY  . ONETOUCH ULTRA test strip CHECK BLOOD SUGAR 2 TIMES A DAY AS DIRECTED DX CODE E11.9  . solifenacin (VESICARE) 10 MG tablet TAKE 1 TABLET BY MOUTH EVERY DAY  . TRADJENTA 5 MG TABS tablet TAKE 1 TABLET BY MOUTH EVERY DAY  . traZODone (DESYREL) 50 MG tablet TAKE 1 TABLET BY MOUTH AT BEDTIME AS NEEDED FOR SLEEP.    Allergies:   Cortisone, Pravachol [pravastatin sodium], and Contrast media [iodinated diagnostic agents]   Social History   Socioeconomic History  . Marital status: Married    Spouse name: Laverna Peace  . Number of children: 2  . Years of education: Not on file  . Highest education level: Not on file  Occupational History  . Occupation: retired    Fish farm manager: UNEMPLOYED  Tobacco Use  . Smoking status: Never Smoker  . Smokeless tobacco: Never Used  Vaping Use  . Vaping Use: Never used  Substance and Sexual Activity  . Alcohol  use: No    Alcohol/week: 0.0 standard drinks  . Drug use: No  . Sexual activity: Yes  Other Topics Concern  . Not on file  Social History Narrative   ** Merged History Encounter **       Exercise walks 3 times daily Lives with spouse in [place] - retired from employment 1 son and 1 daughter  Diet fruit, occ fast food, salads and lean meat  3 tea/day, no EtOH, no tobacco, no drugs    Social Determinants of Health   Financial Resource Strain: Low Risk   . Difficulty of Paying Living Expenses: Not hard at all  Food Insecurity: No Food Insecurity  . Worried About Charity fundraiser in the Last Year: Never true  . Ran Out of Food in the Last  Year: Never true  Transportation Needs: No Transportation Needs  . Lack of Transportation (Medical): No  . Lack of Transportation (Non-Medical): No  Physical Activity: Inactive  . Days of Exercise per Week: 0 days  . Minutes of Exercise per Session: 0 min  Stress: Stress Concern Present  . Feeling of Stress : To some extent  Social Connections: Not on file     Family History:  The patient's family history includes Cancer in her brother and mother; Dementia in her mother; Diabetes in her brother and mother; Hyperlipidemia in her father and mother; Hypertension in her sister; Lung cancer in her father; Pulmonary embolism in her mother; Stroke in her paternal grandfather and paternal grandmother. There is no history of Colon cancer or Esophageal cancer.  ROS:   Review of Systems  Constitutional: Negative for chills, diaphoresis, fever, malaise/fatigue and weight loss.  HENT: Negative for congestion.   Eyes: Negative for discharge and redness.  Respiratory: Negative for cough, hemoptysis, sputum production, shortness of breath and wheezing.   Cardiovascular: Negative for chest pain, palpitations, orthopnea, claudication, leg swelling and PND.  Gastrointestinal: Negative for abdominal pain, blood in stool, heartburn, melena,  nausea and vomiting.  Genitourinary: Negative for hematuria.  Musculoskeletal: Negative for falls and myalgias.  Skin: Negative for rash.  Neurological: Positive for dizziness. Negative for tingling, tremors, sensory change, speech change, focal weakness, loss of consciousness and weakness.  Endo/Heme/Allergies: Does not bruise/bleed easily.  Psychiatric/Behavioral: Negative for substance abuse. The patient is not nervous/anxious.   All other systems reviewed and are negative.    EKGs/Labs/Other Studies Reviewed:    Studies reviewed were summarized above. The additional studies were reviewed today:  2D echo 04/2019: 1. Normal LV systolic function; mild LVE; mild eccentric MR; severe LAE.  2. Left ventricular ejection fraction, by estimation, is 55 to 60%. The  left ventricle has normal function. The left ventrical has no regional  wall motion abnormalities. The left ventricular internal cavity size was  mildly dilated. Left ventricular  diastolic parameters are indeterminate.  3. Right ventricular systolic function is normal. The right ventricular  size is normal.  4. Left atrial size was severely dilated.  5. The mitral valve is normal in structure and function. Mild mitral  valve regurgitation. No evidence of mitral stenosis.  6. The aortic valve is tricuspid. Aortic valve regurgitation is not  visualized. Mild aortic valve sclerosis is present, with no evidence of  aortic valve stenosis.  7. The inferior vena cava is normal in size with greater than 50%  respiratory variability, suggesting right atrial pressure of 3 mmHg. __________  2D echo 04/2020: 1. Left ventricular ejection fraction, by estimation, is 55 to 60%. The  left ventricle has normal function. The left ventricle has no regional  wall motion abnormalities. The left ventricular internal cavity size was  mildly dilated. Left ventricular  diastolic parameters are indeterminate.  2. Right ventricular systolic  function is normal. The right ventricular  size is normal. There is moderately elevated pulmonary artery systolic  pressure. The estimated right ventricular systolic pressure is 18.2 mmHg.  3. Left atrial size was severely dilated.  4. The mitral valve is normal in structure. Moderate mitral valve  regurgitation. No evidence of mitral stenosis.  5. The aortic valve is normal in structure. Aortic valve regurgitation is  not visualized. Mild aortic valve sclerosis is present, with no evidence  of aortic valve stenosis.  6. The inferior vena cava is normal in size with greater than 50%  respiratory variability, suggesting right atrial pressure of 3 mmHg.   Comparison(s): Prior Echo showed LV EF 55-60%, severe LAE with eccentric  MR.    EKG:  EKG is ordered today.  The EKG ordered today demonstrates sinus bradycardia, 57 bpm, rare PAC, no acute ST-T changes  Recent Labs: 07/26/2019: B Natriuretic Peptide 235.5; TSH 1.728 08/27/2019: ALT 10 09/17/2019: BUN 13; Creatinine, Ser 0.97; Magnesium 1.8; Potassium 5.2; Sodium 137 10/01/2019: Hemoglobin 10.5; Platelets 318  Recent Lipid Panel    Component Value Date/Time   CHOL 145 08/27/2019 0833   CHOL 147 11/20/2016 0919   TRIG 52.0 08/27/2019 0833   HDL 49.10 08/27/2019 0833   HDL 47 11/20/2016 0919   CHOLHDL 3 08/27/2019 0833   VLDL 10.4 08/27/2019 0833   LDLCALC 85 08/27/2019 0833   LDLCALC 89 11/20/2016 0919   LDLDIRECT 163.1 07/04/2011 0922    PHYSICAL EXAM:    VS:  BP 140/72 (BP Location: Left Arm, Patient Position: Sitting, Cuff Size: Normal)   Pulse (!) 57   Ht 5\' 5"  (1.651 m)   Wt 168 lb (76.2 kg)   LMP  (LMP Unknown)   SpO2 98%   BMI 27.96 kg/m   BMI: Body mass index is 27.96 kg/m.  Physical Exam Vitals reviewed.  Constitutional:      Appearance: She is well-developed.  HENT:     Head: Normocephalic and atraumatic.  Eyes:     General:        Right eye: No discharge.        Left eye: No discharge.  Neck:      Vascular: No JVD.  Cardiovascular:     Rate and Rhythm: Regular rhythm. Bradycardia present.     Pulses: No midsystolic click and no opening snap.          Posterior tibial pulses are 2+ on the right side and 2+ on the left side.     Heart sounds: S1 normal and S2 normal. Heart sounds not distant. Murmur heard.  High-pitched blowing holosystolic murmur is present with a grade of 1/6 at the apex. No friction rub.  Pulmonary:     Effort: Pulmonary effort is normal. No respiratory distress.     Breath sounds: Normal breath sounds. No decreased breath sounds, wheezing or rales.  Chest:     Chest wall: No tenderness.  Abdominal:     General: There is no distension.     Palpations: Abdomen is soft.     Tenderness: There is no abdominal tenderness.  Musculoskeletal:     Cervical back: Normal range of motion.  Skin:    General: Skin is warm and dry.     Nails: There is no clubbing.  Neurological:     Mental Status: She is alert and oriented to person, place, and time.  Psychiatric:        Speech: Speech normal.        Behavior: Behavior normal.        Thought Content: Thought content normal.        Judgment: Judgment normal.     Wt Readings from Last 3 Encounters:  05/25/20 168 lb (76.2 kg)  05/18/20 171 lb 12.8 oz (77.9 kg)  01/19/20 176 lb (79.8 kg)     ASSESSMENT & PLAN:   1. Persistent A. fib/atrial tachycardia: She is maintaining sinus rhythm with a mildly bradycardic heart rate.  Continue low-dose Lopressor and flecainide.  Should she have recurrent A. fib noted on her loop recorder consider  transitioning flecainide to amiodarone or dofetilide with the assistance of EP.  CHADS2VASc 4 (HTN, age x 1, DM, sex category).  She remains on Eliquis without symptoms concerning for bleeding.  CBC in 09/2019 did show a drop in her hemoglobin to 10.5.  Check CBC, BMP, and magnesium.  2. Dizziness: Appears to be positional in etiology.  Discussed possible tapering of metoprolol though I do  have concerns this may exacerbate tachypalpitations for her without significantly improving her dizziness symptoms.  In this setting, we have agreed to have her undergo a trial of increasing water intake with slow positional changes.  3. Mitral regurgitation: Overall relatively stable with moderate mitral regurgitation.  Follow-up echo in 04/2021.  4. HTN: Blood pressure was mildly elevated at triage, though this has been well controlled at home.  With noted positional dizziness medication was not escalated at this time.  5. HLD: LDL at 85 from 08/2019 with normal LFT at that time.  She remains on atorvastatin.  6. Sleep apnea: Continue CPAP.  This was not discussed in detail at today's visit.  7. Anemia: No symptoms concerning for bleeding.  Check CBC.  Disposition: F/u with Dr. Fletcher Anon or an APP in 6 months, sooner if needed stable, and EP as directed.   Medication Adjustments/Labs and Tests Ordered: Current medicines are reviewed at length with the patient today.  Concerns regarding medicines are outlined above. Medication changes, Labs and Tests ordered today are summarized above and listed in the Patient Instructions accessible in Encounters.   SignedChristell Faith, PA-C 05/25/2020 10:24 AM     [facility] [address] [place], [place] [address] [phone]

## 2020-05-23 NOTE — Progress Notes (Signed)
Carelink Summary Report / Loop Recorder 

## 2020-05-25 ENCOUNTER — Other Ambulatory Visit: Payer: Self-pay

## 2020-05-25 ENCOUNTER — Encounter: Payer: Self-pay | Admitting: Physician Assistant

## 2020-05-25 ENCOUNTER — Ambulatory Visit: Payer: Medicare HMO | Admitting: Physician Assistant

## 2020-05-25 VITALS — BP 140/72 | HR 57 | Ht 65.0 in | Wt 168.0 lb

## 2020-05-25 DIAGNOSIS — I4819 Other persistent atrial fibrillation: Secondary | ICD-10-CM | POA: Diagnosis not present

## 2020-05-25 DIAGNOSIS — I10 Essential (primary) hypertension: Secondary | ICD-10-CM

## 2020-05-25 DIAGNOSIS — E782 Mixed hyperlipidemia: Secondary | ICD-10-CM

## 2020-05-25 DIAGNOSIS — G4733 Obstructive sleep apnea (adult) (pediatric): Secondary | ICD-10-CM | POA: Diagnosis not present

## 2020-05-25 DIAGNOSIS — D649 Anemia, unspecified: Secondary | ICD-10-CM | POA: Diagnosis not present

## 2020-05-25 DIAGNOSIS — I471 Supraventricular tachycardia: Secondary | ICD-10-CM | POA: Diagnosis not present

## 2020-05-25 DIAGNOSIS — I340 Nonrheumatic mitral (valve) insufficiency: Secondary | ICD-10-CM

## 2020-05-25 DIAGNOSIS — I152 Hypertension secondary to endocrine disorders: Secondary | ICD-10-CM | POA: Diagnosis not present

## 2020-05-25 DIAGNOSIS — R42 Dizziness and giddiness: Secondary | ICD-10-CM | POA: Diagnosis not present

## 2020-05-25 DIAGNOSIS — E1159 Type 2 diabetes mellitus with other circulatory complications: Secondary | ICD-10-CM | POA: Diagnosis not present

## 2020-05-25 NOTE — Patient Instructions (Signed)
Medication Instructions:  No changes  *If you need a refill on your cardiac medications before your next appointment, please call your pharmacy*   Lab Work: CBC, BMET, and Mag level done today  If you have labs (blood work) drawn today and your tests are completely normal, you will receive your results only by: [name] MyChart Message (if you have MyChart) OR . A paper copy in the mail If you have any lab test that is abnormal or we need to change your treatment, we will call you to review the results.   Testing/Procedures: None   Follow-Up: At [facility], you and your health needs are our priority.  As part of our continuing mission to provide you with exceptional heart care, we have created designated Provider Care Teams.  These Care Teams include your primary Cardiologist (physician) and Advanced Practice Providers (APPs -  Physician Assistants and Nurse Practitioners) who all work together to provide you with the care you need, when you need it.   Your next appointment:   6 month(s)  The format for your next appointment:   In Person  Provider:   Kathlyn Sacramento, MD or Christell Faith, PA-C

## 2020-05-26 ENCOUNTER — Telehealth: Payer: Self-pay | Admitting: *Deleted

## 2020-05-26 DIAGNOSIS — I4819 Other persistent atrial fibrillation: Secondary | ICD-10-CM

## 2020-05-26 LAB — BASIC METABOLIC PANEL
BUN/Creatinine Ratio: 12 (ref 12–28)
BUN: 12 mg/dL (ref 8–27)
CO2: 22 mmol/L (ref 20–29)
Calcium: 9.5 mg/dL (ref 8.7–10.3)
Chloride: 100 mmol/L (ref 96–106)
Creatinine, Ser: 0.99 mg/dL (ref 0.57–1.00)
Glucose: 233 mg/dL — ABNORMAL HIGH (ref 65–99)
Potassium: 4.5 mmol/L (ref 3.5–5.2)
Sodium: 137 mmol/L (ref 134–144)
eGFR: 60 mL/min/{1.73_m2} (ref >59)

## 2020-05-26 LAB — CBC

## 2020-05-26 LAB — MAGNESIUM: Magnesium: 1.7 mg/dL (ref 1.6–2.3)

## 2020-05-26 NOTE — Telephone Encounter (Signed)
-----   Message from Rise Mu, [place] sent at 05/26/2020 11:14 AM EDT ----- CBC was canceled, please have this redrawn Random glucose is elevated with known diabetes Renal function is normal Potassium is at goal

## 2020-05-26 NOTE — Telephone Encounter (Signed)
Results reviewed with patient and advised we needed to redo the CBC. Instructed her to go to [facility] entrance of the hospital and check in at registration desk and let them know she is there for labs. Order entered and she verbalized understanding with no further questions at this time.

## 2020-05-29 ENCOUNTER — Other Ambulatory Visit
Admission: RE | Admit: 2020-05-29 | Discharge: 2020-05-29 | Disposition: A | Discharge status: Home / Self Care | Payer: Medicare HMO | Attending: Physician Assistant | Admitting: Physician Assistant

## 2020-05-29 DIAGNOSIS — I4819 Other persistent atrial fibrillation: Secondary | ICD-10-CM | POA: Insufficient documentation

## 2020-05-29 LAB — CBC
HCT: 33.1 % — ABNORMAL LOW (ref 36.0–46.0)
Hemoglobin: 10.8 g/dL — ABNORMAL LOW (ref 12.0–15.0)
MCH: 29.4 pg (ref 26.0–34.0)
MCHC: 32.6 g/dL (ref 30.0–36.0)
MCV: 90.2 fL (ref 80.0–100.0)
Platelets: 263 10*3/uL (ref 150–400)
RBC: 3.67 MIL/uL — ABNORMAL LOW (ref 3.87–5.11)
RDW: 13.2 % (ref 11.5–15.5)
WBC: 4.5 10*3/uL (ref 4.0–10.5)
nRBC: 0 % (ref 0.0–0.2)

## 2020-06-06 DIAGNOSIS — G4730 Sleep apnea, unspecified: Secondary | ICD-10-CM | POA: Diagnosis not present

## 2020-06-06 DIAGNOSIS — G4733 Obstructive sleep apnea (adult) (pediatric): Secondary | ICD-10-CM | POA: Diagnosis not present

## 2020-06-07 DIAGNOSIS — G4733 Obstructive sleep apnea (adult) (pediatric): Secondary | ICD-10-CM | POA: Diagnosis not present

## 2020-06-19 ENCOUNTER — Ambulatory Visit (INDEPENDENT_AMBULATORY_CARE_PROVIDER_SITE_OTHER): Payer: Medicare HMO

## 2020-06-19 DIAGNOSIS — I4819 Other persistent atrial fibrillation: Secondary | ICD-10-CM

## 2020-06-20 LAB — CUP PACEART REMOTE DEVICE CHECK
Date Time Interrogation Session: 20220408210325
Implantable Pulse Generator Implant Date: 20210816

## 2020-06-22 ENCOUNTER — Other Ambulatory Visit: Payer: Self-pay | Admitting: Family Medicine

## 2020-06-22 DIAGNOSIS — Z1231 Encounter for screening mammogram for malignant neoplasm of breast: Secondary | ICD-10-CM

## 2020-07-06 DIAGNOSIS — S92355A Nondisplaced fracture of fifth metatarsal bone, left foot, initial encounter for closed fracture: Secondary | ICD-10-CM | POA: Diagnosis not present

## 2020-07-06 DIAGNOSIS — G4730 Sleep apnea, unspecified: Secondary | ICD-10-CM | POA: Diagnosis not present

## 2020-07-06 DIAGNOSIS — G4733 Obstructive sleep apnea (adult) (pediatric): Secondary | ICD-10-CM | POA: Diagnosis not present

## 2020-07-06 NOTE — Progress Notes (Signed)
Carelink Summary Report / Loop Recorder 

## 2020-07-07 DIAGNOSIS — G4733 Obstructive sleep apnea (adult) (pediatric): Secondary | ICD-10-CM | POA: Diagnosis not present

## 2020-07-13 DIAGNOSIS — S92355A Nondisplaced fracture of fifth metatarsal bone, left foot, initial encounter for closed fracture: Secondary | ICD-10-CM | POA: Diagnosis not present

## 2020-07-14 ENCOUNTER — Ambulatory Visit (INDEPENDENT_AMBULATORY_CARE_PROVIDER_SITE_OTHER): Payer: Medicare HMO

## 2020-07-14 DIAGNOSIS — I4819 Other persistent atrial fibrillation: Secondary | ICD-10-CM | POA: Diagnosis not present

## 2020-07-14 LAB — CUP PACEART REMOTE DEVICE CHECK
Date Time Interrogation Session: 20220511205803
Implantable Pulse Generator Implant Date: 20210816

## 2020-07-20 ENCOUNTER — Telehealth: Payer: Self-pay | Admitting: Internal Medicine

## 2020-07-20 NOTE — Telephone Encounter (Signed)
ACCIDENTALLY OPENED PT UNDER CHART, TRANS CALL TO [place] TO [facility] W/ DR. ALLRED

## 2020-07-26 ENCOUNTER — Other Ambulatory Visit: Payer: Self-pay

## 2020-07-26 ENCOUNTER — Encounter: Payer: Self-pay | Admitting: Family Medicine

## 2020-07-26 ENCOUNTER — Ambulatory Visit (INDEPENDENT_AMBULATORY_CARE_PROVIDER_SITE_OTHER): Payer: Medicare HMO | Admitting: Family Medicine

## 2020-07-26 VITALS — BP 120/64 | HR 64 | Temp 97.5°F | Ht 65.0 in | Wt 173.2 lb

## 2020-07-26 DIAGNOSIS — S92352A Displaced fracture of fifth metatarsal bone, left foot, initial encounter for closed fracture: Secondary | ICD-10-CM | POA: Insufficient documentation

## 2020-07-26 DIAGNOSIS — T148XXA Other injury of unspecified body region, initial encounter: Secondary | ICD-10-CM | POA: Diagnosis not present

## 2020-07-26 DIAGNOSIS — E119 Type 2 diabetes mellitus without complications: Secondary | ICD-10-CM | POA: Diagnosis not present

## 2020-07-26 MED ORDER — CEPHALEXIN 500 MG PO CAPS: 500.0000 mg | ORAL_CAPSULE | Freq: Three times a day (TID) | ORAL | 0 refills | Status: DC

## 2020-07-26 NOTE — Assessment & Plan Note (Signed)
Appreciate ortho care.

## 2020-07-26 NOTE — Assessment & Plan Note (Addendum)
Anticipate friction blister vs due to heat from 24 hour/day CAM walker boot use. She notes some improvement since stopping boot use overnight.  Rec against bursting blister.  Not consistent with shingles or HSV. In diabetic with surrounding edema, will cover with keflex 5d course.  Update if spreading blisters or not improving with treatment.

## 2020-07-26 NOTE — Progress Notes (Signed)
Patient ID: [name], female    DOB: 01-09-1946, 75 y.o.   MRN: [id number]  This visit was conducted in person.  BP 120/64   Pulse 64   Temp (!) 97.5 F (36.4 C) (Temporal)   Ht 5\' 5"  (1.651 m)   Wt 173 lb 4 oz (78.6 kg) Comment: Wearing left boot  LMP  (LMP Unknown)   SpO2 98%   BMI 28.83 kg/m    CC: L foot blister Subjective:   HPI: [name] is a 75 y.o. female presenting on 07/26/2020 for Blister (C/o blister on left foot.  Concerned due to DM.  Noticed 07/24/20.  States swelling has gone down.  Pt accompanied by husband, Jimmy- temp 97.7.)   2 days ago noted blister to base of left sole. Not painful. It is tender and itchy. No redness around area. No fevers/chills. Husband drained blister with sterilized needle.   She recently broke her L foot at base of 5th MT (3-4 wks ago) followed by Dr Estell Harpin non-weight bearing in cam walker boot/scooter. Has not needed surgery for now.   Known diabetic on metformin XR 1000mg  bid, tradjenta 5mg  daily. Great control on this regimen.  Lab Results  Component Value Date   HGBA1C 6.1 (A) 05/18/2020       Relevant past medical, surgical, family and social history reviewed and updated as indicated. Interim medical history since our last visit reviewed. Allergies and medications reviewed and updated. Outpatient Medications Prior to Visit  Medication Sig Dispense Refill  . atorvastatin (LIPITOR) 10 MG tablet TAKE 1 TABLET BY MOUTH EVERYDAY AT BEDTIME 90 tablet 2  . calcium carbonate (OS-CAL - DOSED IN MG OF ELEMENTAL CALCIUM) 1250 (500 Ca) MG tablet Take 1 tablet by mouth daily with breakfast.    . ELIQUIS 5 MG TABS tablet TAKE 1 TABLET BY MOUTH TWICE A DAY 60 tablet 9  . escitalopram (LEXAPRO) 10 MG tablet Take 1 tablet by mouth once daily 90 tablet 1  . flecainide (TAMBOCOR) 100 MG tablet Take 0.5 tablets (50 mg total) by mouth 2 (two) times daily. 90 tablet 3  . hydroxypropyl methylcellulose / hypromellose  (ISOPTO TEARS / GONIOVISC) 2.5 % ophthalmic solution Place 1 drop into both eyes 2 (two) times daily as needed for dry eyes.    . metFORMIN (GLUCOPHAGE-XR) 500 MG 24 hr tablet TAKE 2 TABLETS (1,000 MG TOTAL) BY MOUTH 2 (TWO) TIMES DAILY WITH A MEAL. 360 tablet 1  . metoprolol tartrate (LOPRESSOR) 25 MG tablet TAKE 1 TABLET BY MOUTH TWICE A DAY 180 tablet 1  . ONETOUCH ULTRA test strip CHECK BLOOD SUGAR 2 TIMES A DAY AS DIRECTED DX CODE E11.9 100 strip 7  . solifenacin (VESICARE) 10 MG tablet TAKE 1 TABLET BY MOUTH EVERY DAY 30 tablet 5  . TRADJENTA 5 MG TABS tablet TAKE 1 TABLET BY MOUTH EVERY DAY 30 tablet 11  . traZODone (DESYREL) 50 MG tablet TAKE 1 TABLET BY MOUTH AT BEDTIME AS NEEDED FOR SLEEP. 30 tablet 3   No facility-administered medications prior to visit.     Per HPI unless specifically indicated in ROS section below Review of Systems Objective:  BP 120/64   Pulse 64   Temp (!) 97.5 F (36.4 C) (Temporal)   Ht 5\' 5"  (1.651 m)   Wt 173 lb 4 oz (78.6 kg) Comment: Wearing left boot  LMP  (LMP Unknown)   SpO2 98%   BMI 28.83 kg/m   Wt Readings from  Last 3 Encounters:  07/26/20 173 lb 4 oz (78.6 kg)  05/25/20 168 lb (76.2 kg)  05/18/20 171 lb 12.8 oz (77.9 kg)      Physical Exam Vitals and nursing note reviewed.  Constitutional:      Appearance: Normal appearance. She is not ill-appearing.  Musculoskeletal:        General: No swelling or tenderness. Normal range of motion.     Comments: Using cam walker boot and scooter for ambulation  Skin:    General: Skin is warm and dry.     Findings: Rash present. Rash is vesicular.     Comments: Two small tender blisters to left mid-lateral sole with surrounding edema of skin without significant erythema or drainage, blisters appear filled with clear fluid.   Neurological:     Mental Status: She is alert.       Lab Results  Component Value Date   CREATININE 0.99 05/25/2020   BUN 12 05/25/2020   NA 137 05/25/2020   K 4.5  05/25/2020   CL 100 05/25/2020   CO2 22 05/25/2020   Assessment & Plan:  This visit occurred during the SARS-CoV-2 public health emergency.  Safety protocols were in place, including screening questions prior to the visit, additional usage of staff PPE, and extensive cleaning of exam room while observing appropriate contact time as indicated for disinfecting solutions.   Problem List Items Addressed This Visit    Controlled diabetes mellitus type II without complication ([place])   Blister of skin - Primary    Anticipate friction blister vs due to heat from 24 hour/day CAM walker boot use. She notes some improvement since stopping boot use overnight.  Rec against bursting blister.  Not consistent with shingles or HSV. In diabetic with surrounding edema, will cover with keflex 5d course.  Update if spreading blisters or not improving with treatment.       Closed fracture of base of fifth metatarsal bone, left, initial encounter    Appreciate ortho care.           Meds ordered this encounter  Medications  . cephALEXin (KEFLEX) 500 MG capsule    Sig: Take 1 capsule (500 mg total) by mouth 3 (three) times daily.    Dispense:  15 capsule    Refill:  0   No orders of the defined types were placed in this encounter.   Patient Instructions  I think this is blister from irritation of boot.  Keep leg elevated. Cover with antibiotic sent to pharmacy Let us know if new blisters developing.   Follow up plan: Return if symptoms worsen or fail to improve.  Ria Bush, MD

## 2020-07-26 NOTE — Patient Instructions (Addendum)
I think this is blister from irritation of boot.  Keep leg elevated. Cover with antibiotic sent to pharmacy Let us know if new blisters developing.

## 2020-07-27 NOTE — Progress Notes (Signed)
Carelink Summary Report / Loop Recorder 

## 2020-08-06 DIAGNOSIS — G4730 Sleep apnea, unspecified: Secondary | ICD-10-CM | POA: Diagnosis not present

## 2020-08-06 DIAGNOSIS — G4733 Obstructive sleep apnea (adult) (pediatric): Secondary | ICD-10-CM | POA: Diagnosis not present

## 2020-08-07 DIAGNOSIS — G4733 Obstructive sleep apnea (adult) (pediatric): Secondary | ICD-10-CM | POA: Diagnosis not present

## 2020-08-10 ENCOUNTER — Ambulatory Visit
Admission: RE | Admit: 2020-08-10 | Discharge: 2020-08-10 | Disposition: A | Discharge status: Home / Self Care | Payer: Medicare HMO | Source: Ambulatory Visit | Attending: Family Medicine | Admitting: Family Medicine

## 2020-08-10 ENCOUNTER — Other Ambulatory Visit: Payer: Self-pay

## 2020-08-10 DIAGNOSIS — Z1231 Encounter for screening mammogram for malignant neoplasm of breast: Secondary | ICD-10-CM

## 2020-08-12 ENCOUNTER — Other Ambulatory Visit: Payer: Self-pay | Admitting: Cardiovascular Disease

## 2020-08-14 ENCOUNTER — Telehealth: Payer: Self-pay | Admitting: Family Medicine

## 2020-08-14 ENCOUNTER — Other Ambulatory Visit: Payer: Self-pay | Admitting: Family Medicine

## 2020-08-14 DIAGNOSIS — M8440XS Pathological fracture, unspecified site, sequela: Secondary | ICD-10-CM

## 2020-08-14 NOTE — Telephone Encounter (Signed)
[name] called in wanted to know about getting a one density scan due to a fracture in May, and according to her Medicare would cover it 100% due to the fracture and they are needing an order to get it done. And the location is needing to be in [place]

## 2020-08-15 NOTE — Telephone Encounter (Signed)
Ordered

## 2020-08-15 NOTE — Telephone Encounter (Signed)
Spoke with Junie Panning at [facility].  She states she did not specify location when she called and agrees that going to the [facility] where previous Dexa was done is the right thing to do. I left message for Ms. Rockman with this information and let her know someone will be contacting her to set up her appointment for a Bone Density Test.

## 2020-08-15 NOTE — Telephone Encounter (Signed)
Left message for [name] with [facility] to return my call in regards to Bone Density Order for [name].

## 2020-08-16 ENCOUNTER — Other Ambulatory Visit: Payer: Self-pay | Admitting: Pulmonary Disease

## 2020-08-16 ENCOUNTER — Ambulatory Visit (INDEPENDENT_AMBULATORY_CARE_PROVIDER_SITE_OTHER): Payer: Medicare HMO

## 2020-08-16 DIAGNOSIS — I4819 Other persistent atrial fibrillation: Secondary | ICD-10-CM

## 2020-08-16 NOTE — Telephone Encounter (Signed)
Pt requesting Desyrel I did not see this mentioned in her last office note would you like to refill this.

## 2020-08-17 DIAGNOSIS — S92355A Nondisplaced fracture of fifth metatarsal bone, left foot, initial encounter for closed fracture: Secondary | ICD-10-CM | POA: Diagnosis not present

## 2020-08-17 LAB — CUP PACEART REMOTE DEVICE CHECK
Date Time Interrogation Session: 20220613205902
Implantable Pulse Generator Implant Date: 20210816

## 2020-08-21 ENCOUNTER — Other Ambulatory Visit: Payer: Self-pay | Admitting: Internal Medicine

## 2020-08-21 ENCOUNTER — Telehealth: Payer: Self-pay | Admitting: Family Medicine

## 2020-08-21 ENCOUNTER — Other Ambulatory Visit: Payer: Self-pay | Admitting: Cardiovascular Disease

## 2020-08-21 NOTE — Telephone Encounter (Signed)
Please call and schedule CPE with fasting labs prior for Dr. Diona Browner for July.  Looks like her [place] with Junie Spencer has already been scheduled.

## 2020-08-22 NOTE — Telephone Encounter (Signed)
Spoke with patient scheduled CPE with fasting labs 

## 2020-08-29 ENCOUNTER — Ambulatory Visit (INDEPENDENT_AMBULATORY_CARE_PROVIDER_SITE_OTHER): Payer: Medicare HMO

## 2020-08-29 DIAGNOSIS — Z0000 Encounter for general adult medical examination without abnormal findings: Secondary | ICD-10-CM | POA: Diagnosis not present

## 2020-08-29 NOTE — Patient Instructions (Signed)
[name] , Thank you for taking time to come for your Medicare Wellness Visit. I appreciate your ongoing commitment to your health goals. Please review the following plan we discussed and let me know if I can assist you in the future.   Screening recommendations/referrals: Colonoscopy: Up to date, completed 09/02/2017, due 09/2027 Mammogram: Up to date, completed 08/10/2020, due 08/2021 Bone Density: ordered 08/15/2020, Please call and schedule appointment  Recommended yearly ophthalmology/optometry visit for glaucoma screening and checkup Recommended yearly dental visit for hygiene and checkup  Vaccinations: Influenza vaccine: Up to date, completed 02/10/2020, due 10/2020 Pneumococcal vaccine: Completed series Tdap vaccine: decline-insurance Shingles vaccine: due, check with your insurance regarding coverage if interested    Covid-19:declined  Advanced directives: Please bring a copy of your POA (Power of Attorney) and/or Living Will to your next appointment.    Conditions/risks identified: diabetes, hypertension, hyperlipidemia   Next appointment: Follow up in one year for your annual wellness visit    Preventive Care 32 Years and Older, Female Preventive care refers to lifestyle choices and visits with your health care provider that can promote health and wellness. What does preventive care include? A yearly physical exam. This is also called an annual well check. Dental exams once or twice a year. Routine eye exams. Ask your health care provider how often you should have your eyes checked. Personal lifestyle choices, including: Daily care of your teeth and gums. Regular physical activity. Eating a healthy diet. Avoiding tobacco and drug use. Limiting alcohol use. Practicing safe sex. Taking low-dose aspirin every day. Taking vitamin and mineral supplements as recommended by your health care provider. What happens during an annual well check? The services and screenings done by  your health care provider during your annual well check will depend on your age, overall health, lifestyle risk factors, and family history of disease. Counseling  Your health care provider may ask you questions about your: Alcohol use. Tobacco use. Drug use. Emotional well-being. Home and relationship well-being. Sexual activity. Eating habits. History of falls. Memory and ability to understand (cognition). Work and work Statistician. Reproductive health. Screening  You may have the following tests or measurements: Height, weight, and BMI. Blood pressure. Lipid and cholesterol levels. These may be checked every 5 years, or more frequently if you are over 63 years old. Skin check. Lung cancer screening. You may have this screening every year starting at age 74 if you have a 30-pack-year history of smoking and currently smoke or have quit within the past 15 years. Fecal occult blood test (FOBT) of the stool. You may have this test every year starting at age 85. Flexible sigmoidoscopy or colonoscopy. You may have a sigmoidoscopy every 5 years or a colonoscopy every 10 years starting at age 82. Hepatitis C blood test. Hepatitis B blood test. Sexually transmitted disease (STD) testing. Diabetes screening. This is done by checking your blood sugar (glucose) after you have not eaten for a while (fasting). You may have this done every 1-3 years. Bone density scan. This is done to screen for osteoporosis. You may have this done starting at age 6. Mammogram. This may be done every 1-2 years. Talk to your health care provider about how often you should have regular mammograms. Talk with your health care provider about your test results, treatment options, and if necessary, the need for more tests. Vaccines  Your health care provider may recommend certain vaccines, such as: Influenza vaccine. This is recommended every year. Tetanus, diphtheria, and acellular pertussis (  Tdap, Td) vaccine. You  may need a Td booster every 10 years. Zoster vaccine. You may need this after age 36. Pneumococcal 13-valent conjugate (PCV13) vaccine. One dose is recommended after age 40. Pneumococcal polysaccharide (PPSV23) vaccine. One dose is recommended after age 32. Talk to your health care provider about which screenings and vaccines you need and how often you need them. This information is not intended to replace advice given to you by your health care provider. Make sure you discuss any questions you have with your health care provider. Document Released: 03/17/2015 Document Revised: 11/08/2015 Document Reviewed: 12/20/2014 Elsevier Interactive Patient Education  2017 [place] Prevention in the Home Falls can cause injuries. They can happen to people of all ages. There are many things you can do to make your home safe and to help prevent falls. What can I do on the outside of my home? Regularly fix the edges of walkways and driveways and fix any cracks. Remove anything that might make you trip as you walk through a door, such as a raised step or threshold. Trim any bushes or trees on the path to your home. Use bright outdoor lighting. Clear any walking paths of anything that might make someone trip, such as rocks or tools. Regularly check to see if handrails are loose or broken. Make sure that both sides of any steps have handrails. Any raised decks and porches should have guardrails on the edges. Have any leaves, snow, or ice cleared regularly. Use sand or salt on walking paths during winter. Clean up any spills in your garage right away. This includes oil or grease spills. What can I do in the bathroom? Use night lights. Install grab bars by the toilet and in the tub and shower. Do not use towel bars as grab bars. Use non-skid mats or decals in the tub or shower. If you need to sit down in the shower, use a plastic, non-slip stool. Keep the floor dry. Clean up any water that spills  on the floor as soon as it happens. Remove soap buildup in the tub or shower regularly. Attach bath mats securely with double-sided non-slip rug tape. Do not have throw rugs and other things on the floor that can make you trip. What can I do in the bedroom? Use night lights. Make sure that you have a light by your bed that is easy to reach. Do not use any sheets or blankets that are too big for your bed. They should not hang down onto the floor. Have a firm chair that has side arms. You can use this for support while you get dressed. Do not have throw rugs and other things on the floor that can make you trip. What can I do in the kitchen? Clean up any spills right away. Avoid walking on wet floors. Keep items that you use a lot in easy-to-reach places. If you need to reach something above you, use a strong step stool that has a grab bar. Keep electrical cords out of the way. Do not use floor polish or wax that makes floors slippery. If you must use wax, use non-skid floor wax. Do not have throw rugs and other things on the floor that can make you trip. What can I do with my stairs? Do not leave any items on the stairs. Make sure that there are handrails on both sides of the stairs and use them. Fix handrails that are broken or loose. Make sure that handrails are  as long as the stairways. Check any carpeting to make sure that it is firmly attached to the stairs. Fix any carpet that is loose or worn. Avoid having throw rugs at the top or bottom of the stairs. If you do have throw rugs, attach them to the floor with carpet tape. Make sure that you have a light switch at the top of the stairs and the bottom of the stairs. If you do not have them, ask someone to add them for you. What else can I do to help prevent falls? Wear shoes that: Do not have high heels. Have rubber bottoms. Are comfortable and fit you well. Are closed at the toe. Do not wear sandals. If you use a stepladder: Make  sure that it is fully opened. Do not climb a closed stepladder. Make sure that both sides of the stepladder are locked into place. Ask someone to hold it for you, if possible. Clearly mark and make sure that you can see: Any grab bars or handrails. First and last steps. Where the edge of each step is. Use tools that help you move around (mobility aids) if they are needed. These include: Canes. Walkers. Scooters. Crutches. Turn on the lights when you go into a dark area. Replace any light bulbs as soon as they burn out. Set up your furniture so you have a clear path. Avoid moving your furniture around. If any of your floors are uneven, fix them. If there are any pets around you, be aware of where they are. Review your medicines with your doctor. Some medicines can make you feel dizzy. This can increase your chance of falling. Ask your doctor what other things that you can do to help prevent falls. This information is not intended to replace advice given to you by your health care provider. Make sure you discuss any questions you have with your health care provider. Document Released: 12/15/2008 Document Revised: 07/27/2015 Document Reviewed: 03/25/2014 Elsevier Interactive Patient Education  2017 [employer]. .

## 2020-08-29 NOTE — Progress Notes (Signed)
PCP notes:  Health Maintenance: Tdap- insurance Eye exam- due  Foot exam- due Covid- decline shingrix- due  Dexa- due    Abnormal Screenings: none   Patient concerns: Urinary  incontinence   Nurse concerns: none   Next PCP appt.: 10/03/2020 @ 3 pm

## 2020-08-29 NOTE — Progress Notes (Signed)
Subjective:   [name] is a 75 y.o. female who presents for Medicare Annual (Subsequent) preventive examination.  Review of Systems; N/A      I connected with the patient today by telephone and verified that I am speaking with the correct person using two identifiers. Location patient: home Location nurse: work Persons participating in the telephone visit: patient, nurse.   I discussed the limitations, risks, security and privacy concerns of performing an evaluation and management service by telephone and the availability of in person appointments. I also discussed with the patient that there may be a patient responsible charge related to this service. The patient expressed understanding and verbally consented to this telephonic visit.        Cardiac Risk Factors include: advanced age (>85men, >41 women);diabetes mellitus;hypertension;Other (see comment), Risk factor comments: hyperlipidemia     Objective:    Today's Vitals   There is no height or weight on file to calculate BMI.  Advanced Directives 08/29/2020 08/27/2019 07/26/2019 06/10/2019 01/01/2019 09/18/2018 08/17/2018  Does Patient Have a Medical Advance Directive? Yes Yes No No No Yes Yes  Type of Paramedic of [place];Living will [place];Living will - - - [place];Living will [place];Living will  Does patient want to make changes to medical advance directive? - - - - - - -  Copy of [place] in Chart? No - copy requested No - copy requested - - - No - copy requested No - copy requested  Would patient like information on creating a medical advance directive? - - No - Patient declined - - - -    Current Medications (verified) Outpatient Encounter Medications as of 08/29/2020  Medication Sig   atorvastatin (LIPITOR) 10 MG tablet TAKE 1 TABLET BY MOUTH EVERYDAY AT BEDTIME   calcium carbonate (OS-CAL - DOSED IN MG OF  ELEMENTAL CALCIUM) 1250 (500 Ca) MG tablet Take 1 tablet by mouth daily with breakfast.   cephALEXin (KEFLEX) 500 MG capsule Take 1 capsule (500 mg total) by mouth 3 (three) times daily.   ELIQUIS 5 MG TABS tablet TAKE 1 TABLET BY MOUTH TWICE A DAY   escitalopram (LEXAPRO) 10 MG tablet Take 1 tablet by mouth once daily   flecainide (TAMBOCOR) 100 MG tablet TAKE 1 TABLET BY MOUTH TWICE A DAY   hydroxypropyl methylcellulose / hypromellose (ISOPTO TEARS / GONIOVISC) 2.5 % ophthalmic solution Place 1 drop into both eyes 2 (two) times daily as needed for dry eyes.   metFORMIN (GLUCOPHAGE-XR) 500 MG 24 hr tablet TAKE 2 TABLETS (1,000 MG TOTAL) BY MOUTH 2 (TWO) TIMES DAILY WITH A MEAL.   metoprolol tartrate (LOPRESSOR) 25 MG tablet TAKE 1 TABLET BY MOUTH TWICE A DAY   ONETOUCH ULTRA test strip CHECK BLOOD SUGAR 2 TIMES A DAY AS DIRECTED DX CODE E11.9   solifenacin (VESICARE) 10 MG tablet TAKE 1 TABLET BY MOUTH EVERY DAY   TRADJENTA 5 MG TABS tablet TAKE 1 TABLET BY MOUTH EVERY DAY   traZODone (DESYREL) 50 MG tablet TAKE 1 TABLET BY MOUTH EVERY DAY AT BEDTIME AS NEEDED FOR SLEEP   No facility-administered encounter medications on file as of 08/29/2020.    Allergies (verified) Cortisone, Pravachol [pravastatin sodium], and Contrast media [iodinated diagnostic agents]   History: Past Medical History:  Diagnosis Date   Anemia    Anxiety    Atrial fibrillation (HCC)    Colon polyps    Constipation  Depressive disorder, not elsewhere classified    Diabetes mellitus without complication ([place])    Diaphragmatic hernia without mention of obstruction or gangrene    Disorders of bursae and tendons in shoulder region, unspecified    Diverticulosis of colon (without mention of hemorrhage)    External hemorrhoid    Fundic gland polyps of stomach, benign    GERD (gastroesophageal reflux disease)    History of hiatal hernia    HTN (hypertension)    Hypertension    Hypothyroidism    Internal  hemorrhoids without mention of complication    Moderate mitral regurgitation    Obesity, unspecified    OSA on CPAP    compliant with CPAP   Other urinary incontinence    Persistent atrial fibrillation ([place])    Pure hypercholesterolemia    Type II diabetes mellitus ([place])    Urinary frequency    Urinary urgency    Vitamin B 12 deficiency    Past Surgical History:  Procedure Laterality Date   ABDOMINAL HYSTERECTOMY     ABDOMINAL HYSTERECTOMY  1972   Have both ovaries   ATRIAL FIBRILLATION ABLATION  12/17/2016   ATRIAL FIBRILLATION ABLATION N/A 12/17/2016   Procedure: ATRIAL FIBRILLATION ABLATION;  Surgeon: Thompson Grayer, MD;  Location: [facility];  Service: Cardiovascular;  Laterality: N/A;   [facility] [place]   [facility]; "Dr. [name]"   CARDIOVERSION N/A 05/27/2016   Procedure: CARDIOVERSION;  Surgeon: [name], MD;  Location: [facility];  Service: Cardiovascular;  Laterality: N/A;   CARDIOVERSION N/A 11/25/2016   Procedure: CARDIOVERSION;  Surgeon: [name], MD;  Location: [facility];  Service: Cardiovascular;  Laterality: N/A;   CARDIOVERSION N/A 09/18/2018   Procedure: CARDIOVERSION;  Surgeon: [name], MD;  Location: [facility];  Service: Cardiovascular;  Laterality: N/A;   CARDIOVERSION N/A 07/28/2019   Procedure: CARDIOVERSION;  Surgeon: Kate Sable, MD;  Location: [facility];  Service: Cardiovascular;  Laterality: N/A;   CARDIOVERSION N/A 09/07/2019   Procedure: CARDIOVERSION;  Surgeon: Nelva Bush, MD;  Location: [facility];  Service: Cardiovascular;  Laterality: N/A;   CATARACT EXTRACTION W/ INTRAOCULAR LENS  IMPLANT, BILATERAL Bilateral    COLONOSCOPY     ELECTROPHYSIOLOGIC STUDY N/A 01/24/2016   Procedure: CARDIOVERSION;  Surgeon: [name], MD;  Location: [facility];  Service: Cardiovascular;  Laterality: N/A;   implantable loop recorder implantation  10/18/2019   Medtronic Reveal [place] model [id number]) implantable loop  recorder    LAPAROSCOPIC CHOLECYSTECTOMY     REDUCTION MAMMAPLASTY Bilateral    TONSILLECTOMY     TOTAL HIP ARTHROPLASTY Right 05/12/2018   Procedure: TOTAL HIP ARTHROPLASTY ANTERIOR APPROACH;  Surgeon: Paralee Cancel, MD;  Location: [facility];  Service: Orthopedics;  Laterality: Right;  70 mins   Family History  Problem Relation Age of Onset   Cancer Mother        fallopian tube   Dementia Mother    Pulmonary embolism Mother    Hyperlipidemia Mother    Diabetes Mother    Hyperlipidemia Father    Lung cancer Father    Hypertension Sister    Diabetes Brother    Cancer Brother    Stroke Paternal Grandmother    Stroke Paternal Grandfather    Colon cancer Neg Hx    Esophageal cancer Neg Hx    Social History   Socioeconomic History   Marital status: Married    Spouse name: Laverna Peace   Number of children: 2   Years of education:  Not on file   Highest education level: Not on file  Occupational History   Occupation: retired    Fish farm manager: UNEMPLOYED  Tobacco Use   Smoking status: Never   Smokeless tobacco: Never  Vaping Use   Vaping Use: Never used  Substance and Sexual Activity   Alcohol use: No    Alcohol/week: 0.0 standard drinks   Drug use: No   Sexual activity: Yes  Other Topics Concern   Not on file  Social History Narrative   ** Merged History Encounter **       Exercise walks 3 times daily Lives with spouse in [place] - retired from employment 1 son and 1 daughter  Diet fruit, occ fast food, salads and lean meat  3 tea/day, no EtOH, no tobacco, no drugs    Social Determinants of Radio broadcast assistant Strain: Low Risk    Difficulty of Paying Living Expenses: Not hard at all  Food Insecurity: No Food Insecurity   Worried About Charity fundraiser in the Last Year: Never true   [place] in the Last Year: Never true  Transportation Needs: No Transportation Needs   Lack of Transportation (Medical): No   Lack of Transportation  (Non-Medical): No  Physical Activity: Insufficiently Active   Days of Exercise per Week: 3 days   Minutes of Exercise per Session: 40 min  Stress: No Stress Concern Present   Feeling of Stress : Not at all  Social Connections: Not on file    Tobacco Counseling Counseling given: Not Answered   Clinical Intake:  Pre-visit preparation completed: Yes  Pain : No/denies pain     Nutritional Risks: None Diabetes: Yes CBG done?: No Did pt. bring in CBG monitor from home?: No  How often do you need to have someone help you when you read instructions, pamphlets, or other written materials from your doctor or pharmacy?: 1 - Never  Diabetic: Yes Nutrition Risk Assessment:  Has the patient had any N/V/D within the last 2 months?  No  Does the patient have any non-healing wounds?  No  Has the patient had any unintentional weight loss or weight gain?  No   Diabetes:  Is the patient diabetic?  Yes  If diabetic, was a CBG obtained today?  No  telephone visit  Did the patient bring in their glucometer from home?  No  telephone visit  How often do you monitor your CBG's? daily.   Financial Strains and Diabetes Management:  Are you having any financial strains with the device, your supplies or your medication? No .  Does the patient want to be seen by Chronic Care Management for management of their diabetes?  No  Would the patient like to be referred to a Nutritionist or for Diabetic Management?  No   Diabetic Exams:  Diabetic Eye Exam: Overdue for diabetic eye exam. Pt has been advised about the importance in completing this exam. Patient advised to call and schedule an eye exam. Diabetic Foot Exam: Overdue, Pt has been advised about the importance in completing this exam. Pt is scheduled for diabetic foot exam on 10/03/2020.   Interpreter Needed?: No  Information entered by :: CJohnson, LPN   Activities of Daily Living In your present state of health, do you have any  difficulty performing the following activities: 08/29/2020 09/07/2019  Hearing? N N  Vision? N N  Difficulty concentrating or making decisions? Y N  Comment some short term memory issues -  Walking or climbing stairs? N N  Dressing or bathing? N N  Doing errands, shopping? N -  Preparing Food and eating ? N -  Using the Toilet? N -  In the past six months, have you accidently leaked urine? Y -  Comment wears depends and takes medication -  Do you have problems with loss of bowel control? N -  Managing your Medications? N -  Managing your Finances? N -  Housekeeping or managing your Housekeeping? N -  Some recent data might be hidden    Patient Care Team: Jinny Sanders, MD as PCP - General (Family Medicine) [name], MD as PCP - Cardiology (Cardiology) Thompson Grayer, MD as PCP - Electrophysiology (Cardiology) [name], MD as Consulting Physician (Internal Medicine) [name], MD as Consulting Physician (Cardiology) Wilhelmina Mcardle, MD (Inactive) as Consulting Physician (Pulmonary Disease) Jinny Sanders, MD  Indicate any recent Medical Services you may have received from other than [facility] providers in the past year (date may be approximate).     Assessment:   This is a routine wellness examination for [name].  Hearing/Vision screen Vision Screening - Comments:: Patient gets annual eye exams  Dietary issues and exercise activities discussed: Current Exercise Habits: Home exercise routine, Type of exercise: walking, Time (Minutes): 45, Frequency (Times/Week): 3, Weekly Exercise (Minutes/Week): 135, Intensity: Moderate, Exercise limited by: None identified   Goals Addressed             This Visit's Progress    Patient Stated       08/29/2020, I will continue to walk 3 days a week 30-45 minutes.         Depression Screen PHQ 2/9 Scores 08/29/2020 08/27/2019 08/17/2018 05/26/2017 11/20/2016 05/10/2016 05/16/2015  PHQ - 2 Score 0 0 0 0 5 0 0  PHQ- 9  Score 0 0 0 0 19 - -    Fall Risk Fall Risk  08/29/2020 08/27/2019 08/17/2018 05/26/2017 05/10/2016  Falls in the past year? 0 1 1 No No  Comment - - accidental fall in yard - -  Number falls in past yr: 0 0 0 - -  Injury with Fall? 0 0 1 - -  Risk for fall due to : Medication side effect Medication side effect - - -  Follow up Falls evaluation completed;Falls prevention discussed Falls evaluation completed;Falls prevention discussed - - -    FALL RISK PREVENTION PERTAINING TO THE HOME:  Any stairs in or around the home? Yes  If so, are there any without handrails? No  Home free of loose throw rugs in walkways, pet beds, electrical cords, etc? Yes  Adequate lighting in your home to reduce risk of falls? Yes   ASSISTIVE DEVICES UTILIZED TO PREVENT FALLS:  Life alert? No  Use of a cane, walker or w/c? No  Grab bars in the bathroom? No  Shower chair or bench in shower? No  Elevated toilet seat or a handicapped toilet? No   TIMED UP AND GO:  Was the test performed?  N/A telephone visit .    Cognitive Function: MMSE - Mini Mental State Exam 08/29/2020 08/27/2019 08/17/2018 05/26/2017 05/10/2016  Orientation to time 5 5 5 5 5   Orientation to Place 5 5 5 5 5   Registration 3 3 3 3 3   Attention/ Calculation 5 5 0 0 0  Recall 3 3 3 3 2   Recall-comments - - - - pt was unable to recall 1 of 3 words  Language-  name 2 objects - - 0 0 0  Language- repeat 1 1 1 1 1   Language- follow 3 step command - - 0 3 3  Language- read & follow direction - - 0 0 0  Write a sentence - - 0 0 0  Copy design - - 0 0 0  Total score - - 17 20 19   Mini Cog  Mini-Cog screen was completed. Maximum score is 22. A value of 0 denotes this part of the MMSE was not completed or the patient failed this part of the Mini-Cog screening.       Immunizations Immunization History  Administered Date(s) Administered   Fluad Quad(high Dose 65+) 11/17/2018   Influenza Split 01/03/2011, 01/17/2012   Influenza Whole  11/25/2007, 01/18/2009, 11/28/2009   Influenza, High Dose Seasonal PF 12/19/2016, 12/01/2017   Influenza,inj,Quad PF,6+ Mos 01/06/2013, 12/07/2013, 12/14/2014, 11/24/2015, 02/10/2020   Pneumococcal Conjugate-13 05/11/2013   Pneumococcal Polysaccharide-23 01/17/2012, 05/26/2017   Td 04/10/2010   Zoster, Live 05/13/2014    TDAP status: Due, Education has been provided regarding the importance of this vaccine. Advised may receive this vaccine at local pharmacy or Health Dept. Aware to provide a copy of the vaccination record if obtained from local pharmacy or Health Dept. Verbalized acceptance and understanding.  Flu Vaccine status: Up to date  Pneumococcal vaccine status: Up to date  Covid-19 vaccine status: Declined, Education has been provided regarding the importance of this vaccine but patient still declined. Advised may receive this vaccine at local pharmacy or Health Dept.or vaccine clinic. Aware to provide a copy of the vaccination record if obtained from local pharmacy or Health Dept. Verbalized acceptance and understanding.  Qualifies for Shingles Vaccine? Yes   Zostavax completed Yes   Shingrix Completed?: No.    Education has been provided regarding the importance of this vaccine. Patient has been advised to call insurance company to determine out of pocket expense if they have not yet received this vaccine. Advised may also receive vaccine at local pharmacy or Health Dept. Verbalized acceptance and understanding.  Screening Tests Health Maintenance  Topic Date Due   Zoster Vaccines- Shingrix (1 of 2) Never done   URINE MICROALBUMIN  08/17/2019   FOOT EXAM  08/21/2019   TETANUS/TDAP  04/10/2020   OPHTHALMOLOGY EXAM  08/12/2020   DEXA SCAN  09/02/2020 (Originally 06/21/2019)   COVID-19 Vaccine (1) 09/18/2020 (Originally 03/20/1950)   INFLUENZA VACCINE  10/02/2020   HEMOGLOBIN A1C  11/18/2020   MAMMOGRAM  08/10/2021   COLONOSCOPY (Pts 45-6yrs Insurance coverage will need to be  confirmed)  09/03/2027   Hepatitis C Screening  Completed   PNA vac Low Risk Adult  Completed   HPV VACCINES  Aged Out    Health Maintenance  Health Maintenance Due  Topic Date Due   Zoster Vaccines- Shingrix (1 of 2) Never done   URINE MICROALBUMIN  08/17/2019   FOOT EXAM  08/21/2019   TETANUS/TDAP  04/10/2020   OPHTHALMOLOGY EXAM  08/12/2020    Colorectal cancer screening: Type of screening: Colonoscopy. Completed 09/02/2017. Repeat every 10 years  Mammogram status: Completed 08/10/2020. Repeat every year  Bone Density status: Ordered 08/15/2020. Pt provided with contact info and advised to call to schedule appt.  Lung Cancer Screening: (Low Dose CT Chest recommended if Age 54-80 years, 30 pack-year currently smoking OR have quit w/in 15 years.) does not qualify.    Additional Screening:  Hepatitis C Screening: does qualify; Completed 05/09/2015  Vision Screening: Recommended annual ophthalmology exams for  early detection of glaucoma and other disorders of the eye. Is the patient up to date with their annual eye exam?  Yes  Who is the provider or what is the name of the office in which the patient attends annual eye exams? [name] If pt is not established with a provider, would they like to be referred to a provider to establish care? No .   Dental Screening: Recommended annual dental exams for proper oral hygiene  Community Resource Referral / Chronic Care Management: CRR required this visit?  No   CCM required this visit?  No      Plan:     I have personally reviewed and noted the following in the patient's chart:   Medical and social history Use of alcohol, tobacco or illicit drugs  Current medications and supplements including opioid prescriptions.  Functional ability and status Nutritional status Physical activity Advanced directives List of other physicians Hospitalizations, surgeries, and ER visits in previous 12 months Vitals Screenings to include  cognitive, depression, and falls Referrals and appointments  In addition, I have reviewed and discussed with patient certain preventive protocols, quality metrics, and best practice recommendations. A written personalized care plan for preventive services as well as general preventive health recommendations were provided to patient.   Due to this being a telephonic visit, the after visit summary with patients personalized plan was offered to patient via office or my-chart. Patient preferred to pick up at office at next visit or via mychart.   Andrez Grime, LPN   04/15/9415

## 2020-08-30 ENCOUNTER — Other Ambulatory Visit: Payer: Self-pay

## 2020-08-30 ENCOUNTER — Ambulatory Visit
Admission: RE | Admit: 2020-08-30 | Discharge: 2020-08-30 | Disposition: A | Discharge status: Home / Self Care | Payer: Medicare HMO | Source: Ambulatory Visit | Attending: Family Medicine | Admitting: Family Medicine

## 2020-08-30 DIAGNOSIS — Z780 Asymptomatic menopausal state: Secondary | ICD-10-CM | POA: Diagnosis not present

## 2020-08-30 DIAGNOSIS — M8440XS Pathological fracture, unspecified site, sequela: Secondary | ICD-10-CM

## 2020-08-30 DIAGNOSIS — M8589 Other specified disorders of bone density and structure, multiple sites: Secondary | ICD-10-CM | POA: Diagnosis not present

## 2020-09-01 ENCOUNTER — Telehealth: Payer: Self-pay | Admitting: *Deleted

## 2020-09-01 NOTE — Chronic Care Management (AMB) (Signed)
  Chronic Care Management   Note  09/01/2020 Name: [name] MRN: [id number] DOB: [birth year]  [name] is a 75 y.o. year old female who is a primary care patient of Bedsole, Amy E, MD. I reached out to [name] by phone today in response to a referral sent by Ms. Jerni K Maita's PCP  Jinny Sanders, MD     [name] was given information about Chronic Care Management services today including:  CCM service includes personalized support from designated clinical staff supervised by her physician, including individualized plan of care and coordination with other care providers 24/7 contact phone numbers for assistance for urgent and routine care needs. Service will only be billed when office clinical staff spend 20 minutes or more in a month to coordinate care. Only one practitioner may furnish and bill the service in a calendar month. The patient may stop CCM services at any time (effective at the end of the month) by phone call to the office staff. The patient will be responsible for cost sharing (co-pay) of up to 20% of the service fee (after annual deductible is met).  Patient agreed to services and verbal consent obtained.   Follow up plan: Telephone appointment with care management team member scheduled for:09/08/2020  Julian Hy, [facility]  Direct Dial: [phone]

## 2020-09-06 DIAGNOSIS — G4733 Obstructive sleep apnea (adult) (pediatric): Secondary | ICD-10-CM | POA: Diagnosis not present

## 2020-09-07 NOTE — Progress Notes (Signed)
Carelink Summary Report / Loop Recorder 

## 2020-09-08 ENCOUNTER — Ambulatory Visit (INDEPENDENT_AMBULATORY_CARE_PROVIDER_SITE_OTHER): Payer: Medicare HMO

## 2020-09-08 DIAGNOSIS — E119 Type 2 diabetes mellitus without complications: Secondary | ICD-10-CM

## 2020-09-08 DIAGNOSIS — E1159 Type 2 diabetes mellitus with other circulatory complications: Secondary | ICD-10-CM

## 2020-09-08 DIAGNOSIS — I152 Hypertension secondary to endocrine disorders: Secondary | ICD-10-CM | POA: Diagnosis not present

## 2020-09-08 NOTE — Chronic Care Management (AMB) (Signed)
Chronic Care Management   CCM RN Visit Note  09/08/2020 Name: [name] MRN: [id number] DOB: 01/20/1946  Subjective: [name] is a 75 y.o. year old female who is a primary care patient of Bedsole, Amy E, MD. The care management team was consulted for assistance with disease management and care coordination needs.    Engaged with patient by telephone for initial visit in response to provider referral for case management and/or care coordination services.   Consent to Services:  The patient was given the following information about Chronic Care Management services today, agreed to services, and gave verbal consent: 1. CCM service includes personalized support from designated clinical staff supervised by the primary care provider, including individualized plan of care and coordination with other care providers 2. 24/7 contact phone numbers for assistance for urgent and routine care needs. 3. Service will only be billed when office clinical staff spend 20 minutes or more in a month to coordinate care. 4. Only one practitioner may furnish and bill the service in a calendar month. 5.The patient may stop CCM services at any time (effective at the end of the month) by phone call to the office staff. 6. The patient will be responsible for cost sharing (co-pay) of up to 20% of the service fee (after annual deductible is met). Patient agreed to services and consent obtained.  Patient agreed to services and verbal consent obtained.   Assessment: Review of patient past medical history, allergies, medications, health status, including review of consultants reports, laboratory and other test data, was performed as part of comprehensive evaluation and provision of chronic care management services.   SDOH (Social Determinants of Health) assessments and interventions performed:  SDOH Interventions    Flowsheet Row Most Recent Value  SDOH Interventions   Food Insecurity Interventions Intervention Not  Indicated  Financial Strain Interventions Intervention Not Indicated  Housing Interventions Intervention Not Indicated  Transportation Interventions Intervention Not Indicated        CCM Care Plan  Allergies  Allergen Reactions   Cortisone Shortness Of Breath and Rash   Pravachol [Pravastatin Sodium]     Leg pain    Contrast Media [Iodinated Diagnostic Agents] Rash    Outpatient Encounter Medications as of 09/08/2020  Medication Sig   atorvastatin (LIPITOR) 10 MG tablet TAKE 1 TABLET BY MOUTH EVERYDAY AT BEDTIME   calcium carbonate (OS-CAL - DOSED IN MG OF ELEMENTAL CALCIUM) 1250 (500 Ca) MG tablet Take 1 tablet by mouth daily with breakfast.   ELIQUIS 5 MG TABS tablet TAKE 1 TABLET BY MOUTH TWICE A DAY   escitalopram (LEXAPRO) 10 MG tablet Take 1 tablet by mouth once daily   flecainide (TAMBOCOR) 100 MG tablet TAKE 1 TABLET BY MOUTH TWICE A DAY   hydroxypropyl methylcellulose / hypromellose (ISOPTO TEARS / GONIOVISC) 2.5 % ophthalmic solution Place 1 drop into both eyes 2 (two) times daily as needed for dry eyes.   metFORMIN (GLUCOPHAGE-XR) 500 MG 24 hr tablet TAKE 2 TABLETS (1,000 MG TOTAL) BY MOUTH 2 (TWO) TIMES DAILY WITH A MEAL.   metoprolol tartrate (LOPRESSOR) 25 MG tablet TAKE 1 TABLET BY MOUTH TWICE A DAY   solifenacin (VESICARE) 10 MG tablet TAKE 1 TABLET BY MOUTH EVERY DAY   TRADJENTA 5 MG TABS tablet TAKE 1 TABLET BY MOUTH EVERY DAY   traZODone (DESYREL) 50 MG tablet TAKE 1 TABLET BY MOUTH EVERY DAY AT BEDTIME AS NEEDED FOR SLEEP   cephALEXin (KEFLEX) 500 MG capsule Take 1 capsule (500  mg total) by mouth 3 (three) times daily. (Patient not taking: Reported on 09/08/2020)   ONETOUCH ULTRA test strip CHECK BLOOD SUGAR 2 TIMES A DAY AS DIRECTED DX CODE E11.9   No facility-administered encounter medications on file as of 09/08/2020.    Patient Active Problem List   Diagnosis Date Noted   Blister of skin 07/26/2020   Closed fracture of base of fifth metatarsal bone, left,  initial encounter 07/26/2020   Ear pain, left 07/21/2019   Atrial tachycardia ([place]) 09/15/2018   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   Status post total hip replacement, 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   Lumbar radiculopathy 01/05/2018   Vitamin B 12 deficiency    Urge incontinence    OSA on CPAP    History of hiatal hernia    GERD (gastroesophageal reflux disease)    Anxiety    Other constipation 01/02/2017   Class 1 obesity with serious comorbidity and body mass index (BMI) of 30.0 to 30.9 in adult 01/02/2017   Overweight (BMI 25.0-29.9) 12/19/2016   Persistent atrial fibrillation ([place]) 12/17/2016   Vitamin D deficiency 11/20/2016   Family history of ovarian cancer 05/24/2016   Iron deficiency anemia    Hypertension associated with diabetes ([place])    Preoperative evaluation to rule out surgical contraindication 05/16/2015   Controlled diabetes mellitus type II without complication ([place]) 44/03/270   Mitral regurgitation 10/11/2013   Incidental lung nodule 07/16/2011   SCIATICA, RIGHT 04/10/2010   Sleep apnea 10/25/2008   Hyperlipidemia associated with type 2 diabetes mellitus ([place]) 06/11/2006   Depression, major, in remission ([place]) 06/11/2006   HEMORRHOIDS, INTERNAL 06/11/2006   DIVERTICULOSIS, COLON 06/11/2006   STRESS INCONTINENCE 06/11/2006    Conditions to be addressed/monitored:HTN and DMII  Care Plan : Diabetes Type 2 (Adult)  Updates made by Dannielle Karvonen, RN since 09/08/2020 12:00 AM   Problem: Glycemic Management (Diabetes, Type 2)    Long-Range Goal: Glycemic Management Optimized   Start Date: 09/08/2020  Expected End Date: 12/01/2020  This Visit's Progress: On track  Priority: Medium  Objective:  Lab Results  Component Value Date   HGBA1C 6.1 (A) 05/18/2020   Lab Results  Component Value Date   CREATININE 0.99 05/25/2020   CREATININE 0.97 09/17/2019   CREATININE 0.99 09/02/2019   Lab Results  Component Value Date   EGFR 60 05/25/2020  Patient reports history of Diabetes  approximately 10 years. She states she normally checks her blood sugars daily unless she is not feeling well then she will check and additional time. Patient reports her blood sugars range from 80 - 180.  She reports today's fasting blood sugar was 118.  Patient states she follows up with her endocrinologist once every 6 months.   Patient states she is able to afford her medications and takes them as prescribed. She reports following a carb modified diet.  Patient states she was exercising daily by walking prior to her recent fall and fracture of her left foot. She states the boot she was wearing has been discontinued and she is scheduled to follow up with the orthopedic doctor this week for possible clearance to resume normal activities.  Current Barriers:  Knowledge deficit related to long term plan of care for self management of Diabetes Type 2:  Patient states her major concern is keeping her diabetes under control.   Case Manager Clinical Goal(s):  patient will demonstrate improved adherence to prescribed treatment plan for diabetes self care/management as evidenced by: daily monitoring and recording of CBG  adherence to ADA/ carb modified diet exercise 3 days/week adherence to prescribed medication regimen contacting provider for new or worsened symptoms or questions Interventions:  Collaboration with Jinny Sanders, MD regarding development and update of comprehensive plan of care as evidenced by provider attestation and co-signature Inter-disciplinary care team collaboration (see longitudinal plan of care) Reviewed medications with patient and discussed importance of medication adherence Discussed plans with patient for ongoing care management follow up and provided patient with direct contact information for care management team Provided patient with written educational materials related to hypo and hyperglycemia and importance of correct treatment Reviewed scheduled/upcoming provider  appointments including: Primary care provider follow up visit is 10/03/2020, Endocrinology follow up visit is 11/20/2020 Advised patient to continue to monitor blood sugars daily and notify her doctor for findings outside established parameters.   Review of patient status, including review of consultants reports, relevant laboratory and other test results, and medications completed. Advised patient to contact her provider office to discuss bone density results.  Self-Care Activities  Self administers medications as prescribed Attends  scheduled provider appointments Checks blood sugars as prescribed and utilize hyper and hypoglycemia protocol as needed Adheres to prescribed carb modified Patient Goals: - check blood sugar at prescribed times - check blood sugar if you feel it is too high or too low - take the blood sugar log or meter to your doctor visits - schedule appointment with eye doctor - check feet daily for cuts, sores or redness, keep toenails trimmed straight across, wear comfortable well fitting shoes.  - Call and scheduled your eye doctor appointment.  Follow Up Plan: The patient has been provided with contact information for the care management team and has been advised to call with any health related questions or concerns.  The care management team will reach out to the patient again over the next 45 days.      Care Plan : Hypertension (Adult)  Updates made by Dannielle Karvonen, RN since 09/08/2020 12:00 AM   Problem: Disease Progression (Hypertension)   Priority: Medium   Long-Range Goal: Disease Progression Prevented or Minimized   Start Date: 09/08/2020  Expected End Date: 12/01/2020  This Visit's Progress: On track  Priority: Medium  Objective:  Last practice recorded BP readings:  BP Readings from Last 3 Encounters:  07/26/20 120/64  05/25/20 140/72  05/18/20 128/82  Most recent eGFR/CrCl:  Lab Results  Component Value Date   EGFR 60 05/25/2020  Patient states she  monitors her blood pressure at least at least 4 times per week and follows up regularly with her doctors. She reports she is able to afford her medications and takes them as prescribed.  Current Barriers:  Knowledge deficit related to long term plan of care for self management of hypertension.    Case Manager Clinical Goal(s):  patient will attend all scheduled medical appointments patient will demonstrate improved adherence to prescribed treatment plan for hypertension as evidenced by taking all medications as prescribed, monitoring and recording blood pressure as directed, adhering to low sodium/DASH diet patient will verbalize basic understanding of hypertension disease process and self health management plan  Interventions:  Collaboration with Jinny Sanders, MD regarding development and update of comprehensive plan of care as evidenced by provider attestation and co-signature Inter-disciplinary care team collaboration (see longitudinal plan of care) Evaluation of current treatment plan related to hypertension self management and patient's adherence to plan as established by provider. Provided written education to patient re: stroke prevention, s/s of  heart attack and stroke, DASH diet, complications of uncontrolled blood pressure Reviewed medications with patient and discussed importance of compliance Discussed plans with patient for ongoing care management follow up and provided patient with direct contact information for care management team Advised patient, providing education and rationale, to monitor blood pressure daily and record, calling PCP for findings outside established parameters.  Reviewed scheduled/upcoming provider appointments: Next primary care provider follow up visit 10/03/2020, Next cardiology follow up visit 10/16/2020  Self-Care Activities: - Self administers medications as prescribed Attends all scheduled provider appointments Calls provider office for new concerns,  questions, or BP outside discussed parameters Checks BP and records as discussed Follows a low sodium diet/DASH diet Patient Goals: - check blood pressure 3 times per week - write blood pressure results in a log or diary - Exercise as tolerated at least 3 times per week -Follow a low salt diet -Follow up with your providers as recommended.  -Take your medications as prescribed. - Review education information on High blood pressure/ low salt diet sent to you in Mychart.  Follow Up Plan: The patient has been provided with contact information for the care management team and has been advised to call with any health related questions or concerns.  The care management team will reach out to the patient again over the next 45 days.       Plan:The patient has been provided with contact information for the care management team and has been advised to call with any health related questions or concerns.  and The care management team will reach out to the patient again over the next 45 days. Quinn Plowman RN,BSN,CCM RN Case Manager [place]  [phone]

## 2020-09-08 NOTE — Patient Instructions (Addendum)
Visit Information: Thank you for taking the time to speak with me today.  PATIENT GOALS:   Goals Addressed             This Visit's Progress    COMPLETED: DIET - INCREASE WATER INTAKE       Starting 08/17/2018, I will attempt to drink at least 6 glasses of water daily.       Monitor and Manage My Blood Sugar-Diabetes Type 2   On track    Timeframe:  Long-Range Goal Priority:  Medium Start Date:      09/08/2020                       Expected End Date:  12/01/2020                     Follow Up Date 10/13/2020    - check blood sugar at prescribed times - check blood sugar if you feel it is too high or too low - take the blood sugar log or meter to your doctor visits - schedule appointment with eye doctor - check feet daily for cuts, sores or redness, keep toenails trimmed straight across, wear comfortable well fitting shoes.  - Call and scheduled your eye doctor appointment.    Why is this important?   Checking your blood sugar at home helps to keep it from getting very high or very low.  Writing the results in a diary or log helps the doctor know how to care for you.  Your blood sugar log should have the time, date and the results.  Also, write down the amount of insulin or other medicine that you take.  Other information, like what you ate, exercise done and how you were feeling, will also be helpful.           Track and Manage My Blood Pressure-Hypertension   On track    Timeframe:  Long-Range Goal Priority:  Medium Start Date:  09/08/2020                           Expected End Date:   12/01/2020                    Follow Up Date 10/13/2020    - check blood pressure 3 times per week - write blood pressure results in a log or diary - Exercise as tolerated at least 3 times per week -Follow a low salt diet -Follow up with your providers as recommended.  -Take your medications as prescribed.  - Review education information on High blood pressure/ low salt diet sent to you in  [facility].  Why is this important?   You won't feel high blood pressure, but it can still hurt your blood vessels.  High blood pressure can cause heart or kidney problems. It can also cause a stroke.  Making lifestyle changes like losing a little weight or eating less salt will help.  Checking your blood pressure at home and at different times of the day can help to control blood pressure.  If the doctor prescribes medicine remember to take it the way the doctor ordered.  Call the office if you cannot afford the medicine or if there are questions about it.              Consent to CCM Services: Ms. Ehly was given information about Chronic Care Management services today including:  CCM service includes personalized support from designated clinical staff supervised by her physician, including individualized plan of care and coordination with other care providers 24/7 contact phone numbers for assistance for urgent and routine care needs. Service will only be billed when office clinical staff spend 20 minutes or more in a month to coordinate care. Only one practitioner may furnish and bill the service in a calendar month. The patient may stop CCM services at any time (effective at the end of the month) by phone call to the office staff. The patient will be responsible for cost sharing (co-pay) of up to 20% of the service fee (after annual deductible is met).  Patient agreed to services and verbal consent obtained.   Patient verbalizes understanding of instructions provided today and agrees to view in [place].   The patient has been provided with contact information for the care management team and has been advised to call with any health related questions or concerns.  The care management team will reach out to the patient again over the next 45 days.   Quinn Plowman RN,BSN,CCM RN Case Manager [place]  [phone]   CLINICAL CARE PLAN: Patient Care Plan: Diabetes Type  2 (Adult)   Problem Identified: Glycemic Management (Diabetes, Type 2)    Long-Range Goal: Glycemic Management Optimized   Start Date: 09/08/2020  Expected End Date: 12/01/2020  This Visit's Progress: On track  Priority: Medium  Objective:  Lab Results  Component Value Date   HGBA1C 6.1 (A) 05/18/2020   Lab Results  Component Value Date   CREATININE 0.99 05/25/2020   CREATININE 0.97 09/17/2019   CREATININE 0.99 09/02/2019   Lab Results  Component Value Date   EGFR 60 05/25/2020  Patient reports history of Diabetes approximately 10 years. She states she normally checks her blood sugars daily unless she is not feeling well then she will check and additional time. Patient reports her blood sugars range from 80 - 180.  She reports today's fasting blood sugar was 118.  Patient states she follows up with her endocrinologist once every 6 months.   Patient states she is able to afford her medications and takes them as prescribed. She reports following a carb modified diet.  Patient states she was exercising daily by walking prior to her recent fall and fracture of her left foot. She states the boot she was wearing has been discontinued and she is scheduled to follow up with the orthopedic doctor this week for possible clearance to resume normal activities.  Current Barriers:  Knowledge deficit related to long term plan of care for self management of Diabetes Type 2:  Patient states her major concern is keeping her diabetes under control.   Case Manager Clinical Goal(s):  patient will demonstrate improved adherence to prescribed treatment plan for diabetes self care/management as evidenced by: daily monitoring and recording of CBG  adherence to ADA/ carb modified diet exercise 3 days/week adherence to prescribed medication regimen contacting provider for new or worsened symptoms or questions Interventions:  Collaboration with Jinny Sanders, MD regarding development and update of comprehensive plan  of care as evidenced by provider attestation and co-signature Inter-disciplinary care team collaboration (see longitudinal plan of care) Reviewed medications with patient and discussed importance of medication adherence Discussed plans with patient for ongoing care management follow up and provided patient with direct contact information for care management team Provided patient with written educational materials related to hypo and hyperglycemia and importance of correct treatment Reviewed scheduled/upcoming  provider appointments including: Primary care provider follow up visit is 10/03/2020, Endocrinology follow up visit is 11/20/2020 Advised patient to continue to monitor blood sugars daily and notify her doctor for findings outside established parameters.   Review of patient status, including review of consultants reports, relevant laboratory and other test results, and medications completed. Advised patient to contact her provider office to discuss bone density results.  Self-Care Activities  Self administers medications as prescribed Attends  scheduled provider appointments Checks blood sugars as prescribed and utilize hyper and hypoglycemia protocol as needed Adheres to prescribed carb modified Patient Goals: - check blood sugar at prescribed times - check blood sugar if you feel it is too high or too low - take the blood sugar log or meter to your doctor visits - schedule appointment with eye doctor - check feet daily for cuts, sores or redness, keep toenails trimmed straight across, wear comfortable well fitting shoes.  - Call and scheduled your eye doctor appointment.  Follow Up Plan: The patient has been provided with contact information for the care management team and has been advised to call with any health related questions or concerns.  The care management team will reach out to the patient again over the next 45 days.       Patient Care Plan: Hypertension (Adult)   Problem  Identified: Disease Progression (Hypertension)   Priority: Medium   Long-Range Goal: Disease Progression Prevented or Minimized   Start Date: 09/08/2020  Expected End Date: 12/01/2020  This Visit's Progress: On track  Priority: Medium  Objective:  Last practice recorded BP readings:  BP Readings from Last 3 Encounters:  07/26/20 120/64  05/25/20 140/72  05/18/20 128/82  Most recent eGFR/CrCl:  Lab Results  Component Value Date   EGFR 60 05/25/2020  Patient states she monitors her blood pressure at least at least 4 times per week and follows up regularly with her doctors. She reports she is able to afford her medications and takes them as prescribed.  Current Barriers:  Knowledge deficit related to long term plan of care for self management of hypertension.    Case Manager Clinical Goal(s):  patient will attend all scheduled medical appointments patient will demonstrate improved adherence to prescribed treatment plan for hypertension as evidenced by taking all medications as prescribed, monitoring and recording blood pressure as directed, adhering to low sodium/DASH diet patient will verbalize basic understanding of hypertension disease process and self health management plan  Interventions:  Collaboration with Jinny Sanders, MD regarding development and update of comprehensive plan of care as evidenced by provider attestation and co-signature Inter-disciplinary care team collaboration (see longitudinal plan of care) Evaluation of current treatment plan related to hypertension self management and patient's adherence to plan as established by provider. Provided written education to patient re: stroke prevention, s/s of heart attack and stroke, DASH diet, complications of uncontrolled blood pressure Reviewed medications with patient and discussed importance of compliance Discussed plans with patient for ongoing care management follow up and provided patient with direct contact information  for care management team Advised patient, providing education and rationale, to monitor blood pressure daily and record, calling PCP for findings outside established parameters.  Reviewed scheduled/upcoming provider appointments: Next primary care provider follow up visit 10/03/2020, Next cardiology follow up visit 10/16/2020  Self-Care Activities: - Self administers medications as prescribed Attends all scheduled provider appointments Calls provider office for new concerns, questions, or BP outside discussed parameters Checks BP and records as discussed Follows a low  sodium diet/DASH diet Patient Goals: - check blood pressure 3 times per week - write blood pressure results in a log or diary - Exercise as tolerated at least 3 times per week -Follow a low salt diet -Follow up with your providers as recommended.  -Take your medications as prescribed. - Review education information on High blood pressure/ low salt diet sent to you in Mychart.  Follow Up Plan: The patient has been provided with contact information for the care management team and has been advised to call with any health related questions or concerns.  The care management team will reach out to the patient again over the next 45 days.

## 2020-09-13 DIAGNOSIS — S92351D Displaced fracture of fifth metatarsal bone, right foot, subsequent encounter for fracture with routine healing: Secondary | ICD-10-CM | POA: Diagnosis not present

## 2020-09-16 ENCOUNTER — Other Ambulatory Visit: Payer: Self-pay | Admitting: Family Medicine

## 2020-09-18 ENCOUNTER — Ambulatory Visit (INDEPENDENT_AMBULATORY_CARE_PROVIDER_SITE_OTHER): Payer: Medicare HMO

## 2020-09-18 DIAGNOSIS — I4819 Other persistent atrial fibrillation: Secondary | ICD-10-CM

## 2020-09-19 LAB — CUP PACEART REMOTE DEVICE CHECK
Date Time Interrogation Session: 20220716210302
Implantable Pulse Generator Implant Date: 20210816

## 2020-09-22 ENCOUNTER — Telehealth: Payer: Self-pay | Admitting: Family Medicine

## 2020-09-22 DIAGNOSIS — D508 Other iron deficiency anemias: Secondary | ICD-10-CM

## 2020-09-22 DIAGNOSIS — E538 Deficiency of other specified B group vitamins: Secondary | ICD-10-CM

## 2020-09-22 DIAGNOSIS — E119 Type 2 diabetes mellitus without complications: Secondary | ICD-10-CM

## 2020-09-22 DIAGNOSIS — E559 Vitamin D deficiency, unspecified: Secondary | ICD-10-CM

## 2020-09-22 NOTE — Telephone Encounter (Signed)
-----   Message from Cloyd Stagers, RT sent at 09/11/2020  9:22 AM EDT ----- Regarding: Lab Orders for Tuesday 7.26.2022 Please place lab orders for Tuesday 7.26.2022, office visit for physical on Tuesday 8.2.2022 Thank you, Dyke Maes RT(R)

## 2020-09-26 ENCOUNTER — Other Ambulatory Visit (INDEPENDENT_AMBULATORY_CARE_PROVIDER_SITE_OTHER): Payer: Medicare HMO

## 2020-09-26 ENCOUNTER — Other Ambulatory Visit: Payer: Self-pay

## 2020-09-26 DIAGNOSIS — E119 Type 2 diabetes mellitus without complications: Secondary | ICD-10-CM | POA: Diagnosis not present

## 2020-09-26 DIAGNOSIS — D508 Other iron deficiency anemias: Secondary | ICD-10-CM | POA: Diagnosis not present

## 2020-09-26 DIAGNOSIS — E538 Deficiency of other specified B group vitamins: Secondary | ICD-10-CM | POA: Diagnosis not present

## 2020-09-26 DIAGNOSIS — E559 Vitamin D deficiency, unspecified: Secondary | ICD-10-CM | POA: Diagnosis not present

## 2020-09-26 LAB — VITAMIN B12: Vitamin B-12: 961 pg/mL — ABNORMAL HIGH (ref 211–911)

## 2020-09-26 LAB — CBC WITH DIFFERENTIAL/PLATELET
Basophils Absolute: 0 10*3/uL (ref 0.0–0.1)
Basophils Relative: 0.7 % (ref 0.0–3.0)
Eosinophils Absolute: 0.1 10*3/uL (ref 0.0–0.7)
Eosinophils Relative: 2.8 % (ref 0.0–5.0)
HCT: 31.8 % — ABNORMAL LOW (ref 36.0–46.0)
Hemoglobin: 10.7 g/dL — ABNORMAL LOW (ref 12.0–15.0)
Lymphocytes Relative: 33.5 % (ref 12.0–46.0)
Lymphs Abs: 1.5 10*3/uL (ref 0.7–4.0)
MCHC: 33.6 g/dL (ref 30.0–36.0)
MCV: 88.8 fl (ref 78.0–100.0)
Monocytes Absolute: 0.4 10*3/uL (ref 0.1–1.0)
Monocytes Relative: 8.2 % (ref 3.0–12.0)
Neutro Abs: 2.4 10*3/uL (ref 1.4–7.7)
Neutrophils Relative %: 54.8 % (ref 43.0–77.0)
Platelets: 274 10*3/uL (ref 150.0–400.0)
RBC: 3.58 Mil/uL — ABNORMAL LOW (ref 3.87–5.11)
RDW: 14.4 % (ref 11.5–15.5)
WBC: 4.4 10*3/uL (ref 4.0–10.5)

## 2020-09-26 LAB — LIPID PANEL
Cholesterol: 127 mg/dL (ref 0–200)
HDL: 42.5 mg/dL (ref >39.00)
LDL Cholesterol: 74 mg/dL (ref 0–99)
NonHDL: 84.49
Total CHOL/HDL Ratio: 3
Triglycerides: 51 mg/dL (ref 0.0–149.0)
VLDL: 10.2 mg/dL (ref 0.0–40.0)

## 2020-09-26 LAB — VITAMIN D 25 HYDROXY (VIT D DEFICIENCY, FRACTURES): VITD: 67.69 ng/mL (ref 30.00–100.00)

## 2020-09-26 LAB — COMPREHENSIVE METABOLIC PANEL
ALT: 10 U/L (ref 0–35)
AST: 12 U/L (ref 0–37)
Albumin: 4.1 g/dL (ref 3.5–5.2)
Alkaline Phosphatase: 47 U/L (ref 39–117)
BUN: 12 mg/dL (ref 6–23)
CO2: 30 mEq/L (ref 19–32)
Calcium: 9.4 mg/dL (ref 8.4–10.5)
Chloride: 103 mEq/L (ref 96–112)
Creatinine, Ser: 0.96 mg/dL (ref 0.40–1.20)
GFR: 58.69 mL/min — ABNORMAL LOW (ref >60.00)
Glucose, Bld: 131 mg/dL — ABNORMAL HIGH (ref 70–99)
Potassium: 4.6 mEq/L (ref 3.5–5.1)
Sodium: 138 mEq/L (ref 135–145)
Total Bilirubin: 0.4 mg/dL (ref 0.2–1.2)
Total Protein: 6.4 g/dL (ref 6.0–8.3)

## 2020-09-26 LAB — IBC + FERRITIN
Ferritin: 13.8 ng/mL (ref 10.0–291.0)
Iron: 68 ug/dL (ref 42–145)
Saturation Ratios: 17.3 % — ABNORMAL LOW (ref 20.0–50.0)
Transferrin: 280 mg/dL (ref 212.0–360.0)

## 2020-09-26 LAB — HEMOGLOBIN A1C: Hgb A1c MFr Bld: 6.7 % — ABNORMAL HIGH (ref 4.6–6.5)

## 2020-09-27 NOTE — Progress Notes (Signed)
No critical labs need to be addressed urgently. We will discuss labs in detail at upcoming office visit.   

## 2020-09-29 DIAGNOSIS — D225 Melanocytic nevi of trunk: Secondary | ICD-10-CM | POA: Diagnosis not present

## 2020-09-29 DIAGNOSIS — D1801 Hemangioma of skin and subcutaneous tissue: Secondary | ICD-10-CM | POA: Diagnosis not present

## 2020-09-29 DIAGNOSIS — B078 Other viral warts: Secondary | ICD-10-CM | POA: Diagnosis not present

## 2020-09-29 DIAGNOSIS — L814 Other melanin hyperpigmentation: Secondary | ICD-10-CM | POA: Diagnosis not present

## 2020-09-29 DIAGNOSIS — L570 Actinic keratosis: Secondary | ICD-10-CM | POA: Diagnosis not present

## 2020-09-29 DIAGNOSIS — L821 Other seborrheic keratosis: Secondary | ICD-10-CM | POA: Diagnosis not present

## 2020-09-29 DIAGNOSIS — L648 Other androgenic alopecia: Secondary | ICD-10-CM | POA: Diagnosis not present

## 2020-10-03 ENCOUNTER — Encounter: Payer: Medicare HMO | Admitting: Family Medicine

## 2020-10-05 DIAGNOSIS — G4733 Obstructive sleep apnea (adult) (pediatric): Secondary | ICD-10-CM | POA: Diagnosis not present

## 2020-10-05 DIAGNOSIS — G4730 Sleep apnea, unspecified: Secondary | ICD-10-CM | POA: Diagnosis not present

## 2020-10-11 NOTE — Progress Notes (Signed)
Carelink Summary Report / Loop Recorder 

## 2020-10-13 ENCOUNTER — Ambulatory Visit (INDEPENDENT_AMBULATORY_CARE_PROVIDER_SITE_OTHER): Payer: Medicare HMO

## 2020-10-13 DIAGNOSIS — E1159 Type 2 diabetes mellitus with other circulatory complications: Secondary | ICD-10-CM | POA: Diagnosis not present

## 2020-10-13 DIAGNOSIS — E119 Type 2 diabetes mellitus without complications: Secondary | ICD-10-CM

## 2020-10-13 DIAGNOSIS — I152 Hypertension secondary to endocrine disorders: Secondary | ICD-10-CM | POA: Diagnosis not present

## 2020-10-13 NOTE — Patient Instructions (Signed)
Visit Information: Thank you for taking the time to speak with me.  PATIENT GOALS:  Goals Addressed             This Visit's Progress    Monitor and Manage My Blood Sugar-Diabetes Type 2   On track    Timeframe:  Long-Range Goal Priority:  Medium Start Date:      09/08/2020                       Expected End Date:  01/31/2021                    Follow Up Date 11/20/2020    - Continue to check blood sugar at prescribed times - check blood sugar if you feel it is too high or too low - take the blood sugar log or meter to your doctor visits - schedule appointment with eye doctor - check feet daily for cuts, sores or redness, keep toenails trimmed straight across, wear comfortable well fitting shoes.  - follow diabetic diet -    RULE OF 15 How to treat low blood sugars (Blood sugar less than 70 mg/dl  Please follow the RULE OF 15 for the treatment of hypoglycemia treatment (When your blood sugars are less than 70 mg/ dl) STEP  1:  Take 15 grams of carbohydrates when your blood sugar is low, which includes:   3-4 glucose tabs or  3-4 oz of juice or regular soda or  One tube of glucose gel STEP 2:  Recheck blood sugar in 15 minutes STEP 3:  If your blood sugar is still low at the 15 minute recheck ---then, go back to STEP 1 and treat again with another 15 grams of carbohydrates   Why is this important?   Checking your blood sugar at home helps to keep it from getting very high or very low.  Writing the results in a diary or log helps the doctor know how to care for you.  Your blood sugar log should have the time, date and the results.  Also, write down the amount of insulin or other medicine that you take.  Other information, like what you ate, exercise done and how you were feeling, will also be helpful.          Track and Manage My Blood Pressure-Hypertension   On track    Timeframe:  Long-Range Goal Priority:  Medium Start Date:  09/08/2020                           Expected  End Date:   01/31/2021                   Follow Up Date 11/20/2020  - Continue to check blood pressure at least 3 times per week - continue to write blood pressure results in a log or diary - Exercise as tolerated at least 3 times per week -Follow a low salt diet -Follow up with your providers as recommended.  -Continue to take your medications as prescribed. - Managing lightheadedness/ dizziness:  Avoid standing up to quickly, stay hydrated   Why is this important?   You won't feel high blood pressure, but it can still hurt your blood vessels.  High blood pressure can cause heart or kidney problems. It can also cause a stroke.  Making lifestyle changes like losing a little weight or eating less salt will help.  Checking your blood pressure at home and at different times of the day can help to control blood pressure.  If the doctor prescribes medicine remember to take it the way the doctor ordered.  Call the office if you cannot afford the medicine or if there are questions about it.             Patient verbalizes understanding of instructions provided today and agrees to view in [place].   The patient has been provided with contact information for the care management team and has been advised to call with any health related questions or concerns.  The care management team will reach out to the patient again over the next 45 days.  Quinn Plowman RN,BSN,CCM RN Case Manager [place]  [phone]

## 2020-10-13 NOTE — Chronic Care Management (AMB) (Signed)
Chronic Care Management   CCM RN Visit Note  10/13/2020 Name: [name] MRN: [id number] DOB: 06/19/1945  Subjective: [name] is a 75 y.o. year old female who is a primary care patient of Bedsole, Amy E, MD. The care management team was consulted for assistance with disease management and care coordination needs.    Engaged with patient by telephone for follow up visit in response to provider referral for case management and/or care coordination services.   Consent to Services:  The patient was given information about Chronic Care Management services, agreed to services, and gave verbal consent prior to initiation of services.  Please see initial visit note for detailed documentation.   Patient agreed to services and verbal consent obtained.   Assessment: Review of patient past medical history, allergies, medications, health status, including review of consultants reports, laboratory and other test data, was performed as part of comprehensive evaluation and provision of chronic care management services.   SDOH (Social Determinants of Health) assessments and interventions performed:    CCM Care Plan  Allergies  Allergen Reactions   Cortisone Shortness Of Breath and Rash   Pravachol [Pravastatin Sodium]     Leg pain    Contrast Media [Iodinated Diagnostic Agents] Rash    Outpatient Encounter Medications as of 10/13/2020  Medication Sig   atorvastatin (LIPITOR) 10 MG tablet TAKE 1 TABLET BY MOUTH EVERYDAY AT BEDTIME   calcium carbonate (OS-CAL - DOSED IN MG OF ELEMENTAL CALCIUM) 1250 (500 Ca) MG tablet Take 1 tablet by mouth daily with breakfast.   cephALEXin (KEFLEX) 500 MG capsule Take 1 capsule (500 mg total) by mouth 3 (three) times daily. (Patient not taking: Reported on 09/08/2020)   ELIQUIS 5 MG TABS tablet TAKE 1 TABLET BY MOUTH TWICE A DAY   escitalopram (LEXAPRO) 10 MG tablet Take 1 tablet by mouth once daily   flecainide (TAMBOCOR) 100 MG tablet TAKE 1 TABLET BY  MOUTH TWICE A DAY   hydroxypropyl methylcellulose / hypromellose (ISOPTO TEARS / GONIOVISC) 2.5 % ophthalmic solution Place 1 drop into both eyes 2 (two) times daily as needed for dry eyes.   metFORMIN (GLUCOPHAGE-XR) 500 MG 24 hr tablet TAKE 2 TABLETS (1,000 MG TOTAL) BY MOUTH 2 (TWO) TIMES DAILY WITH A MEAL.   metoprolol tartrate (LOPRESSOR) 25 MG tablet TAKE 1 TABLET BY MOUTH TWICE A DAY   ONETOUCH ULTRA test strip CHECK BLOOD SUGAR 2 TIMES A DAY AS DIRECTED DX CODE E11.9   solifenacin (VESICARE) 10 MG tablet TAKE 1 TABLET BY MOUTH EVERY DAY   TRADJENTA 5 MG TABS tablet TAKE 1 TABLET BY MOUTH EVERY DAY   traZODone (DESYREL) 50 MG tablet TAKE 1 TABLET BY MOUTH EVERY DAY AT BEDTIME AS NEEDED FOR SLEEP   No facility-administered encounter medications on file as of 10/13/2020.    Patient Active Problem List   Diagnosis Date Noted   Blister of skin 07/26/2020   Closed fracture of base of fifth metatarsal bone, left, initial encounter 07/26/2020   Ear pain, left 07/21/2019   Atrial tachycardia ([place]) 09/15/2018   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   Status post total hip replacement, 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   Lumbar radiculopathy 01/05/2018   Vitamin B 12 deficiency    Urge incontinence    OSA on CPAP    History of hiatal hernia    GERD (gastroesophageal reflux disease)    Anxiety    Other constipation 01/02/2017   Class 1 obesity with serious comorbidity and body mass index (  BMI) of 30.0 to 30.9 in adult 01/02/2017   Overweight (BMI 25.0-29.9) 12/19/2016   Persistent atrial fibrillation ([place]) 12/17/2016   Vitamin D deficiency 11/20/2016   Family history of ovarian cancer 05/24/2016   Iron deficiency anemia    Hypertension associated with diabetes ([place])    Preoperative evaluation to rule out surgical contraindication 05/16/2015   Controlled diabetes mellitus type II without complication ([place]) 16/12/9602   Mitral regurgitation 10/11/2013   Incidental lung nodule 07/16/2011   SCIATICA, RIGHT  04/10/2010   Sleep apnea 10/25/2008   Hyperlipidemia associated with type 2 diabetes mellitus ([place]) 06/11/2006   Depression, major, in remission ([place]) 06/11/2006   HEMORRHOIDS, INTERNAL 06/11/2006   DIVERTICULOSIS, COLON 06/11/2006   STRESS INCONTINENCE 06/11/2006    Conditions to be addressed/monitored:HTN and DMII  Care Plan : Diabetes Type 2 (Adult)  Updates made by Dannielle Karvonen, RN since 10/13/2020 12:00 AM     Problem: Glycemic Management (Diabetes, Type 2)   Priority: Medium     Long-Range Goal: Glycemic Management Optimized   Start Date: 09/08/2020  Expected End Date: 01/31/2021  This Visit's Progress: On track  Recent Progress: On track  Priority: Medium  Note:   Objective:  Lab Results  Component Value Date   HGBA1C 6.7 (H) 09/26/2020   Lab Results  Component Value Date   CREATININE 0.96 09/26/2020   CREATININE 0.99 05/25/2020   CREATININE 0.97 09/17/2019   Lab Results  Component Value Date   EGFR 60 05/25/2020  Patient states she is doing well. She reports today's fasting blood sugar was 139.  She states she contributes this to having steak and a small piece of bake potato last evening.  Denies any new symptoms or concerns. Reports taking her medications as prescribed. Patient states she has a follow up visit with her primary car provider on 10/20/2020. Patient states she follows up with her endocrinologist once every 6 months.    Current Barriers:  Knowledge deficit related to long term plan of care for self management of Diabetes Type 2:  Patient states her major concern is keeping her diabetes under control.   Case Manager Clinical Goal(s):  patient will demonstrate improved adherence to prescribed treatment plan for diabetes self care/management as evidenced by: daily monitoring and recording of CBG  adherence to ADA/ carb modified diet exercise 3 days/week adherence to prescribed medication regimen contacting provider for new or worsened symptoms or  questions Interventions:  Collaboration with Jinny Sanders, MD regarding development and update of comprehensive plan of care as evidenced by provider attestation and co-signature Inter-disciplinary care team collaboration (see longitudinal plan of care) Reviewed medications with patient and discussed importance of medication adherence Discussed plans with patient for ongoing care management follow up and provided patient with direct contact information for care management team Provided patient with written educational materials related to hypo and hyperglycemia and importance of correct treatment Reviewed scheduled/upcoming provider appointments including: Primary care provider follow up visit is 10/20/2020 Endocrinology follow up visit is 11/20/2020 Advised patient to continue to monitor blood sugars daily and notify her doctor for findings outside established parameters.   Review of patient status, including review of consultants reports, relevant laboratory and other test results, and medications completed. Advised patient to contact her provider office to discuss bone density results.  Self-Care Activities  Self administers medications as prescribed Attends  scheduled provider appointments Checks blood sugars as prescribed and utilize hyper and hypoglycemia protocol as needed Adheres to prescribed carb modified Patient Goals: -  Continue to check blood sugar at prescribed times - check blood sugar if you feel it is too high or too low - take the blood sugar log or meter to your doctor visits - schedule appointment with eye doctor - check feet daily for cuts, sores or redness, keep toenails trimmed straight across, wear comfortable well fitting shoes.  - follow diabetic diet -    RULE OF 15 How to treat low blood sugars (Blood sugar less than 70 mg/dl  Please follow the RULE OF 15 for the treatment of hypoglycemia treatment (When your blood sugars are less than 70 mg/ dl) STEP  1:  Take  15 grams of carbohydrates when your blood sugar is low, which includes:   3-4 glucose tabs or  3-4 oz of juice or regular soda or  One tube of glucose gel STEP 2:  Recheck blood sugar in 15 minutes STEP 3:  If your blood sugar is still low at the 15 minute recheck ---then, go back to STEP 1 and treat again with another 15 grams of carbohydrates Follow Up Plan: The patient has been provided with contact information for the care management team and has been advised to call with any health related questions or concerns.  The care management team will reach out to the patient again over the next 45 days.         Care Plan : Hypertension (Adult)  Updates made by Dannielle Karvonen, RN since 10/13/2020 12:00 AM     Problem: Disease Progression (Hypertension)   Priority: Medium     Long-Range Goal: Disease Progression Prevented or Minimized   Start Date: 09/08/2020  Expected End Date: 01/31/2021  This Visit's Progress: On track  Recent Progress: On track  Priority: Medium  Note:   Objective:  Last practice recorded BP readings:  BP Readings from Last 3 Encounters:  07/26/20 120/64  05/25/20 140/72  05/18/20 128/82  Most recent eGFR/CrCl:  Lab Results  Component Value Date   EGFR 60 05/25/2020  Patient reports today's blood pressure reading was 139/70 with pulse of 59.  She states she occasionally experiences feelings of lightheadedness.  Patient with a-fib and has loop recorder. She states her provider is aware of her occasional symptoms of lightheadedness.  She reports taking her medications as prescribed. Denies any new or worsening symptoms. Patient reports next follow up with her cardiologist is 10/16/2020 Current Barriers:  Knowledge deficit related to long term plan of care for self management of hypertension.    Case Manager Clinical Goal(s):  patient will attend all scheduled medical appointments patient will demonstrate improved adherence to prescribed treatment plan for  hypertension as evidenced by taking all medications as prescribed, monitoring and recording blood pressure as directed, adhering to low sodium/DASH diet patient will verbalize basic understanding of hypertension disease process and self health management plan  Interventions:  Collaboration with Jinny Sanders, MD regarding development and update of comprehensive plan of care as evidenced by provider attestation and co-signature Inter-disciplinary care team collaboration (see longitudinal plan of care) Evaluation of current treatment plan related to hypertension self management and patient's adherence to plan as established by provider. Provided written education to patient re: stroke prevention, s/s of heart attack and stroke, DASH diet, complications of uncontrolled blood pressure,  Reviewed medications with patient and discussed importance of compliance Discussed plans with patient for ongoing care management follow up and provided patient with direct contact information for care management team Advised patient, providing education and rationale,  to monitor blood pressure daily and record, calling PCP for findings outside established parameters.  Reviewed scheduled/upcoming provider appointments: Next primary care provider follow up visit 10/20/2020.  Next cardiology follow up visit 10/16/2020  Self-Care Activities: - Self administers medications as prescribed Attends all scheduled provider appointments Calls provider office for new concerns, questions, or BP outside discussed parameters Checks BP and records as discussed Follows a low sodium diet/DASH diet Patient Goals: - Continue to check blood pressure at least 3 times per week - continue to write blood pressure results in a log or diary - Exercise as tolerated at least 3 times per week -Follow a low salt diet -Follow up with your providers as recommended.  -Continue to take your medications as prescribed. - Managing lightheadedness/  dizziness:  Avoid standing up to quickly, stay hydrated   Follow Up Plan: The patient has been provided with contact information for the care management team and has been advised to call with any health related questions or concerns.  The care management team will reach out to the patient again over the next 45 days.           Plan:The patient has been provided with contact information for the care management team and has been advised to call with any health related questions or concerns.  and The care management team will reach out to the patient again over the next 45 days. Quinn Plowman RN,BSN,CCM RN Case Manager [place]  [phone]

## 2020-10-16 ENCOUNTER — Other Ambulatory Visit: Payer: Self-pay

## 2020-10-16 ENCOUNTER — Other Ambulatory Visit: Payer: Self-pay | Admitting: Family Medicine

## 2020-10-16 ENCOUNTER — Ambulatory Visit: Payer: Medicare HMO | Admitting: Internal Medicine

## 2020-10-16 VITALS — BP 136/76 | HR 52 | Ht 65.0 in | Wt 171.0 lb

## 2020-10-16 DIAGNOSIS — I471 Supraventricular tachycardia: Secondary | ICD-10-CM

## 2020-10-16 DIAGNOSIS — G4733 Obstructive sleep apnea (adult) (pediatric): Secondary | ICD-10-CM

## 2020-10-16 DIAGNOSIS — I480 Paroxysmal atrial fibrillation: Secondary | ICD-10-CM | POA: Diagnosis not present

## 2020-10-16 DIAGNOSIS — I340 Nonrheumatic mitral (valve) insufficiency: Secondary | ICD-10-CM | POA: Diagnosis not present

## 2020-10-16 DIAGNOSIS — I10 Essential (primary) hypertension: Secondary | ICD-10-CM

## 2020-10-16 MED ORDER — METOPROLOL TARTRATE 25 MG PO TABS: 12.5000 mg | ORAL_TABLET | Freq: Two times a day (BID) | ORAL | 3 refills | Status: DC

## 2020-10-16 NOTE — Patient Instructions (Addendum)
Medication Instructions:  Reduce Metoprolol Tartrate to 12.5 mg two times a day Your physician recommends that you continue on your current medications as directed. Please refer to the Current Medication list given to you today.  Labwork: None ordered.  Testing/Procedures: None ordered.  Follow-Up: Your physician wants you to follow-up in: 12 months with Thompson Grayer, MD or one of the following Advanced Practice Providers on your designated Care Team:    Tommye Standard, PA-C    You will receive a reminder letter in the mail two months in advance. If you don't receive a letter, please call our office to schedule the follow-up appointment.  Any Other Special Instructions Will Be Listed Below (If Applicable).  If you need a refill on your cardiac medications before your next appointment, please call your pharmacy.

## 2020-10-16 NOTE — Progress Notes (Signed)
PCP: Jinny Sanders, MD Primary Cardiologist: Dr Fletcher Anon Primary EP: Dr Rayann Heman  [name] is a 75 y.o. female who presents today for routine electrophysiology followup.  Since last being seen in our clinic, the patient reports doing reasonably well.  Dizziness and falls are better.  She has low BP in the mornings with fatigue.  Otherwise, no symptoms of afib.  Today, she denies symptoms of palpitations, chest pain, shortness of breath,  lower extremity edema, dizziness, presyncope, or syncope.  The patient is otherwise without complaint today.   Past Medical History:  Diagnosis Date   Anemia    Anxiety    Atrial fibrillation (HCC)    Colon polyps    Constipation    Depressive disorder, not elsewhere classified    Diabetes mellitus without complication ([place])    Diaphragmatic hernia without mention of obstruction or gangrene    Disorders of bursae and tendons in shoulder region, unspecified    Diverticulosis of colon (without mention of hemorrhage)    External hemorrhoid    Fundic gland polyps of stomach, benign    GERD (gastroesophageal reflux disease)    History of hiatal hernia    HTN (hypertension)    Hypertension    Hypothyroidism    Internal hemorrhoids without mention of complication    Moderate mitral regurgitation    Obesity, unspecified    OSA on CPAP    compliant with CPAP   Other urinary incontinence    Persistent atrial fibrillation ([place])    Pure hypercholesterolemia    Type II diabetes mellitus ([place])    Urinary frequency    Urinary urgency    Vitamin B 12 deficiency    Past Surgical History:  Procedure Laterality Date   ABDOMINAL HYSTERECTOMY     ABDOMINAL HYSTERECTOMY  1972   Have both ovaries   ATRIAL FIBRILLATION ABLATION  12/17/2016   ATRIAL FIBRILLATION ABLATION N/A 12/17/2016   Procedure: ATRIAL FIBRILLATION ABLATION;  Surgeon: Thompson Grayer, MD;  Location: [facility];  Service: Cardiovascular;  Laterality: N/A;   [facility]; "Dr. [name]"   CARDIOVERSION N/A 05/27/2016   Procedure: CARDIOVERSION;  Surgeon: [name], MD;  Location: [facility];  Service: Cardiovascular;  Laterality: N/A;   CARDIOVERSION N/A 11/25/2016   Procedure: CARDIOVERSION;  Surgeon: [name], MD;  Location: [facility];  Service: Cardiovascular;  Laterality: N/A;   CARDIOVERSION N/A 09/18/2018   Procedure: CARDIOVERSION;  Surgeon: [name], MD;  Location: [facility];  Service: Cardiovascular;  Laterality: N/A;   CARDIOVERSION N/A 07/28/2019   Procedure: CARDIOVERSION;  Surgeon: Kate Sable, MD;  Location: [facility];  Service: Cardiovascular;  Laterality: N/A;   CARDIOVERSION N/A 09/07/2019   Procedure: CARDIOVERSION;  Surgeon: Nelva Bush, MD;  Location: [facility];  Service: Cardiovascular;  Laterality: N/A;   CATARACT EXTRACTION W/ INTRAOCULAR LENS  IMPLANT, BILATERAL Bilateral    COLONOSCOPY     ELECTROPHYSIOLOGIC STUDY N/A 01/24/2016   Procedure: CARDIOVERSION;  Surgeon: [name], MD;  Location: [facility];  Service: Cardiovascular;  Laterality: N/A;   implantable loop recorder implantation  10/18/2019   [employer] Reveal [place] model [id number]) implantable loop recorder    LAPAROSCOPIC CHOLECYSTECTOMY     REDUCTION MAMMAPLASTY Bilateral    TONSILLECTOMY     TOTAL HIP ARTHROPLASTY Right 05/12/2018   Procedure: TOTAL HIP ARTHROPLASTY ANTERIOR APPROACH;  Surgeon: Paralee Cancel, MD;  Location: [facility];  Service: Orthopedics;  Laterality: Right;  70 mins  ROS- all systems are reviewed and negatives except as per HPI above  Current Outpatient Medications  Medication Sig Dispense Refill   atorvastatin (LIPITOR) 10 MG tablet TAKE 1 TABLET BY MOUTH EVERYDAY AT BEDTIME 90 tablet 0   calcium carbonate (OS-CAL - DOSED IN MG OF ELEMENTAL CALCIUM) 1250 (500 Ca) MG tablet Take 1 tablet by mouth daily with breakfast.     cephALEXin (KEFLEX) 500 MG capsule Take 1 capsule (500 mg total) by mouth 3 (three)  times daily. 15 capsule 0   ELIQUIS 5 MG TABS tablet TAKE 1 TABLET BY MOUTH TWICE A DAY 60 tablet 9   escitalopram (LEXAPRO) 10 MG tablet Take 1 tablet by mouth once daily 90 tablet 1   flecainide (TAMBOCOR) 100 MG tablet TAKE 1 TABLET BY MOUTH TWICE A DAY 180 tablet 0   hydroxypropyl methylcellulose / hypromellose (ISOPTO TEARS / GONIOVISC) 2.5 % ophthalmic solution Place 1 drop into both eyes 2 (two) times daily as needed for dry eyes.     metFORMIN (GLUCOPHAGE-XR) 500 MG 24 hr tablet TAKE 2 TABLETS (1,000 MG TOTAL) BY MOUTH 2 (TWO) TIMES DAILY WITH A MEAL. 360 tablet 1   metoprolol tartrate (LOPRESSOR) 25 MG tablet TAKE 1 TABLET BY MOUTH TWICE A DAY 180 tablet 0   ONETOUCH ULTRA test strip CHECK BLOOD SUGAR 2 TIMES A DAY AS DIRECTED DX CODE E11.9 100 strip 7   solifenacin (VESICARE) 10 MG tablet TAKE 1 TABLET BY MOUTH EVERY DAY 30 tablet 0   TRADJENTA 5 MG TABS tablet TAKE 1 TABLET BY MOUTH EVERY DAY 30 tablet 11   traZODone (DESYREL) 50 MG tablet TAKE 1 TABLET BY MOUTH EVERY DAY AT BEDTIME AS NEEDED FOR SLEEP 30 tablet 5   No current facility-administered medications for this visit.    Physical Exam: Vitals:   10/16/20 1402  BP: 136/76  Pulse: (!) 52  SpO2: 98%  Weight: 171 lb (77.6 kg)  Height: '5\' 5"'$  (1.651 m)    GEN- The patient is well appearing, alert and oriented x 3 today.   Head- normocephalic, atraumatic Eyes-  Sclera clear, conjunctiva pink Ears- hearing intact Oropharynx- clear Lungs- Clear to ausculation bilaterally, normal work of breathing Heart- Regular rate and rhythm, no murmurs, rubs or gallops, PMI not laterally displaced GI- soft, NT, ND, + BS Extremities- no clubbing, cyanosis, or edema  Wt Readings from Last 3 Encounters:  10/16/20 171 lb (77.6 kg)  07/26/20 173 lb 4 oz (78.6 kg)  05/25/20 168 lb (76.2 kg)    EKG tracing ordered today is personally reviewed and shows sinus bradycardia  Assessment and Plan:  Afib/ atach Afib burden is 0.4% by ILR.   Episodes that I review are actually sinus with PACs and not afib.  A pause is noted which appears to be undersensing of sinus and not a true pause. She is doing well with flecainide despite severe LA enlargement. Chads2vasc score is 4.  She is on eliquis  2. HTN Continue reduce lopressor to 12.'5mg'$  BID  3. HL Continue atorvastatin '10mg'$  daily  4. OSA Not recently compliant with CPAP We will try to be more diligent with this  5. Moderate MR Echo 2/22 reveals moderate MR Repeat echo in February Dr Fletcher Anon following   Risks, benefits and potential toxicities for medications prescribed and/or refilled reviewed with patient today.   Return in a year to see EP APP  Thompson Grayer MD, [facility] 10/16/2020 2:06 PM

## 2020-10-17 ENCOUNTER — Other Ambulatory Visit: Payer: Self-pay | Admitting: Family Medicine

## 2020-10-19 ENCOUNTER — Other Ambulatory Visit: Payer: Self-pay

## 2020-10-19 ENCOUNTER — Ambulatory Visit
Admission: EM | Admit: 2020-10-19 | Discharge: 2020-10-19 | Disposition: A | Discharge status: Home / Self Care | Payer: Medicare HMO | Attending: Emergency Medicine | Admitting: Emergency Medicine

## 2020-10-19 ENCOUNTER — Encounter: Payer: Self-pay | Admitting: Emergency Medicine

## 2020-10-19 DIAGNOSIS — L03115 Cellulitis of right lower limb: Secondary | ICD-10-CM

## 2020-10-19 DIAGNOSIS — E119 Type 2 diabetes mellitus without complications: Secondary | ICD-10-CM | POA: Diagnosis not present

## 2020-10-19 LAB — HM DIABETES EYE EXAM

## 2020-10-19 MED ORDER — DOXYCYCLINE HYCLATE 100 MG PO CAPS: 100.0000 mg | ORAL_CAPSULE | Freq: Two times a day (BID) | ORAL | 0 refills | Status: DC

## 2020-10-19 NOTE — Discharge Instructions (Addendum)
Take the antibiotic as directed.    Keep your wound clean and dry.  Wash it gently twice a day with soap and water.  Apply an antibiotic cream twice a day.    Go to the emergency department if you have signs of worsening infection such as fever, red streaks, increased redness, pus-like drainage, or other concerns.    Follow-up with your primary care provider in 1 to 2 days for recheck of your wound.

## 2020-10-19 NOTE — ED Triage Notes (Signed)
Pt here with pressure washer injury to medial side of the right lower leg on Saturday. Initially, it did not look very extensive, but is now presenting with swelling, and ecchymosis despite keeping clean and covered with neosporin.

## 2020-10-19 NOTE — ED Provider Notes (Signed)
[name]    CSN: [id number] Arrival date & time: 10/19/20  1617      History   Chief Complaint Chief Complaint  Patient presents with   Wound Check     HPI [name] is a 75 y.o. female.  Patient presents with wound on her right ankle with surrounding redness and swelling.  The wound occurred on 10/15/2020 when she was pressure washing.  She denies wound drainage, fever, chills, red streaks, numbness, weakness, paresthesias, or other symptoms.  Treatment at home with peroxide.  Patient reports she is up-to-date on her tetanus.  Her medical history includes diabetes, hypertension, atrial fibrillation.  Patient is on Eliquis.    The history is provided by the patient and medical records.   Past Medical History:  Diagnosis Date   Anemia    Anxiety    Atrial fibrillation (HCC)    Colon polyps    Constipation    Depressive disorder, not elsewhere classified    Diabetes mellitus without complication ([place])    Diaphragmatic hernia without mention of obstruction or gangrene    Disorders of bursae and tendons in shoulder region, unspecified    Diverticulosis of colon (without mention of hemorrhage)    External hemorrhoid    Fundic gland polyps of stomach, benign    GERD (gastroesophageal reflux disease)    History of hiatal hernia    HTN (hypertension)    Hypertension    Hypothyroidism    Internal hemorrhoids without mention of complication    Moderate mitral regurgitation    Obesity, unspecified    OSA on CPAP    compliant with CPAP   Other urinary incontinence    Persistent atrial fibrillation ([place])    Pure hypercholesterolemia    Type II diabetes mellitus ([place])    Urinary frequency    Urinary urgency    Vitamin B 12 deficiency     Patient Active Problem List   Diagnosis Date Noted   Blister of skin 07/26/2020   Closed fracture of base of fifth metatarsal bone, left, initial encounter 07/26/2020   Ear pain, left 07/21/2019   Atrial tachycardia  ([place]) 09/15/2018   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   Status post total hip replacement, 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   Lumbar radiculopathy 01/05/2018   Vitamin B 12 deficiency    Urge incontinence    OSA on CPAP    History of hiatal hernia    GERD (gastroesophageal reflux disease)    Anxiety    Other constipation 01/02/2017   Class 1 obesity with serious comorbidity and body mass index (BMI) of 30.0 to 30.9 in adult 01/02/2017   Overweight (BMI 25.0-29.9) 12/19/2016   Persistent atrial fibrillation ([place]) 12/17/2016   Vitamin D deficiency 11/20/2016   Family history of ovarian cancer 05/24/2016   Iron deficiency anemia    Hypertension associated with diabetes ([place])    Preoperative evaluation to rule out surgical contraindication 05/16/2015   Controlled diabetes mellitus type II without complication ([place]) [id number]   Mitral regurgitation 10/11/2013   Incidental lung nodule 07/16/2011   SCIATICA, RIGHT 04/10/2010   Sleep apnea 10/25/2008   Hyperlipidemia associated with type 2 diabetes mellitus ([place]) 06/11/2006   Depression, major, in remission ([place]) 06/11/2006   HEMORRHOIDS, INTERNAL 06/11/2006   DIVERTICULOSIS, COLON 06/11/2006   STRESS INCONTINENCE 06/11/2006    Past Surgical History:  Procedure Laterality Date   ABDOMINAL HYSTERECTOMY     ABDOMINAL HYSTERECTOMY  1972   Have both ovaries   ATRIAL FIBRILLATION ABLATION  12/17/2016   ATRIAL FIBRILLATION ABLATION N/A 12/17/2016   Procedure: ATRIAL FIBRILLATION ABLATION;  Surgeon: Thompson Grayer, MD;  Location: [facility];  Service: Cardiovascular;  Laterality: N/A;   [place]   [facility]; "Dr. [name]"   CARDIOVERSION N/A 05/27/2016   Procedure: CARDIOVERSION;  Surgeon: [name], MD;  Location: [facility];  Service: Cardiovascular;  Laterality: N/A;   CARDIOVERSION N/A 11/25/2016   Procedure: CARDIOVERSION;  Surgeon: [name], MD;  Location: [facility];  Service: Cardiovascular;  Laterality: N/A;    CARDIOVERSION N/A 09/18/2018   Procedure: CARDIOVERSION;  Surgeon: [name], MD;  Location: [facility];  Service: Cardiovascular;  Laterality: N/A;   CARDIOVERSION N/A 07/28/2019   Procedure: CARDIOVERSION;  Surgeon: Kate Sable, MD;  Location: [facility];  Service: Cardiovascular;  Laterality: N/A;   CARDIOVERSION N/A 09/07/2019   Procedure: CARDIOVERSION;  Surgeon: Nelva Bush, MD;  Location: [facility];  Service: Cardiovascular;  Laterality: N/A;   CATARACT EXTRACTION W/ INTRAOCULAR LENS  IMPLANT, BILATERAL Bilateral    COLONOSCOPY     ELECTROPHYSIOLOGIC STUDY N/A 01/24/2016   Procedure: CARDIOVERSION;  Surgeon: [name], MD;  Location: [facility];  Service: Cardiovascular;  Laterality: N/A;   implantable loop recorder implantation  10/18/2019   [employer] Reveal [place] model [id number]) implantable loop recorder    LAPAROSCOPIC CHOLECYSTECTOMY     REDUCTION MAMMAPLASTY Bilateral    TONSILLECTOMY     TOTAL HIP ARTHROPLASTY Right 05/12/2018   Procedure: TOTAL HIP ARTHROPLASTY ANTERIOR APPROACH;  Surgeon: Paralee Cancel, MD;  Location: [facility];  Service: Orthopedics;  Laterality: Right;  70 mins    OB History     Gravida  2   Para  0   Term  0   Preterm  0   AB  0   Living         SAB  0   IAB  0   Ectopic  0   Multiple      Live Births               Home Medications    Prior to Admission medications   Medication Sig Start Date End Date Taking? Authorizing Provider  doxycycline (VIBRAMYCIN) 100 MG capsule Take 1 capsule (100 mg total) by mouth 2 (two) times daily for 7 days. 10/19/20 10/26/20 Yes Sharion Balloon, NP  atorvastatin (LIPITOR) 10 MG tablet TAKE 1 TABLET BY MOUTH EVERYDAY AT BEDTIME 08/21/20   Bedsole, Amy E, MD  calcium carbonate (OS-CAL - DOSED IN MG OF ELEMENTAL CALCIUM) 1250 (500 Ca) MG tablet Take 1 tablet by mouth daily with breakfast.    [provider]  cephALEXin (KEFLEX) 500 MG capsule Take 1 capsule (500 mg total)  by mouth 3 (three) times daily. 07/26/20   Ria Bush, MD  ELIQUIS 5 MG TABS tablet TAKE 1 TABLET BY MOUTH TWICE A DAY 03/23/20   Allred, Jeneen Rinks, MD  escitalopram (LEXAPRO) 10 MG tablet Take 1 tablet by mouth once daily 10/17/20   Diona Browner, Amy E, MD  flecainide (TAMBOCOR) 100 MG tablet TAKE 1 TABLET BY MOUTH TWICE A DAY 08/21/20   Dunn, Areta Haber, PA-C  hydroxypropyl methylcellulose / hypromellose (ISOPTO TEARS / GONIOVISC) 2.5 % ophthalmic solution Place 1 drop into both eyes 2 (two) times daily as needed for dry eyes.    [provider]  metFORMIN (GLUCOPHAGE-XR) 500 MG 24 hr tablet TAKE 2 TABLETS (1,000 MG TOTAL) BY MOUTH 2 (TWO) TIMES DAILY WITH A  MEAL. 08/24/20   [name], MD  metoprolol tartrate (LOPRESSOR) 25 MG tablet Take 0.5 tablets (12.5 mg total) by mouth 2 (two) times daily. 10/16/20   Allred, Jeneen Rinks, MD  ONETOUCH ULTRA test strip CHECK BLOOD SUGAR 2 TIMES A DAY AS DIRECTED DX CODE E11.9 08/24/20   [name], MD  solifenacin (VESICARE) 10 MG tablet TAKE 1 TABLET BY MOUTH EVERY DAY 10/17/20   Diona Browner, Amy E, MD  TRADJENTA 5 MG TABS tablet TAKE 1 TABLET BY MOUTH EVERY DAY 11/22/19   [name], MD  traZODone (DESYREL) 50 MG tablet TAKE 1 TABLET BY MOUTH EVERY DAY AT BEDTIME AS NEEDED FOR SLEEP 08/16/20   Chesley Mires, MD    Family History Family History  Problem Relation Age of Onset   Cancer Mother        fallopian tube   Dementia Mother    Pulmonary embolism Mother    Hyperlipidemia Mother    Diabetes Mother    Hyperlipidemia Father    Lung cancer Father    Hypertension Sister    Diabetes Brother    Cancer Brother    Stroke Paternal Grandmother    Stroke Paternal Grandfather    Colon cancer Neg Hx    Esophageal cancer Neg Hx     Social History Social History   Tobacco Use   Smoking status: Never   Smokeless tobacco: Never  Vaping Use   Vaping Use: Never used  Substance Use Topics   Alcohol use: No    Alcohol/week: 0.0 standard drinks    Drug use: No     Allergies   Cortisone, Pravachol [pravastatin sodium], and Contrast media [iodinated diagnostic agents]   Review of Systems Review of Systems  Constitutional:  Negative for chills and fever.  Respiratory:  Negative for cough and shortness of breath.   Cardiovascular:  Negative for chest pain and palpitations.  Gastrointestinal:  Negative for abdominal pain and vomiting.  Skin:  Positive for color change and wound.  Neurological:  Negative for weakness and numbness.  All other systems reviewed and are negative.   Physical Exam Triage Vital Signs ED Triage Vitals  Enc Vitals Group     BP      Pulse      Resp      Temp      Temp src      SpO2      Weight      Height      Head Circumference      Peak Flow      Pain Score      Pain Loc      Pain Edu?      Excl. in [place]?    No data found.  Updated Vital Signs BP (!) 162/81   Pulse 65   Temp 98.5 F (36.9 C)   Resp 18   LMP  (LMP Unknown)   SpO2 99%   Visual Acuity Right Eye Distance:   Left Eye Distance:   Bilateral Distance:    Right Eye Near:   Left Eye Near:    Bilateral Near:     Physical Exam Vitals and nursing note reviewed.  Constitutional:      General: She is not in acute distress.    Appearance: She is well-developed. She is not ill-appearing.  HENT:     Head: Normocephalic and atraumatic.     Mouth/Throat:     Mouth: Mucous membranes are moist.  Eyes:     Conjunctiva/sclera: Conjunctivae  normal.  Cardiovascular:     Rate and Rhythm: Normal rate and regular rhythm.     Heart sounds: Normal heart sounds.  Pulmonary:     Effort: Pulmonary effort is normal. No respiratory distress.     Breath sounds: Normal breath sounds.  Abdominal:     Palpations: Abdomen is soft.     Tenderness: There is no abdominal tenderness.  Musculoskeletal:        General: Swelling and tenderness present. Normal range of motion.     Cervical back: Neck supple.  Skin:    General: Skin is warm  and dry.     Capillary Refill: Capillary refill takes less than 2 seconds.     Findings: Erythema and lesion present.     Comments: Wound on right ankle with surrounding erythema and edema.  No drainage.  See pictures for details.  Neurological:     General: No focal deficit present.     Mental Status: She is alert and oriented to person, place, and time.     Sensory: No sensory deficit.     Motor: No weakness.     Gait: Gait normal.  Psychiatric:        Mood and Affect: Mood normal.        Behavior: Behavior normal.        UC Treatments / Results  Labs (all labs ordered are listed, but only abnormal results are displayed) Labs Reviewed - No data to display  EKG   Radiology No results found.  Procedures Procedures (including critical care time)  Medications Ordered in UC Medications - No data to display  Initial Impression / Assessment and Plan / UC Course  I have reviewed the triage vital signs and the nursing notes.  Pertinent labs & imaging results that were available during my care of the patient were reviewed by me and considered in my medical decision making (see chart for details).  Cellulitis of right ankle.  Patient is diabetic.  Treating with doxycycline.  Wound care instructions and signs of worsening infection discussed.  Instructed patient to have a wound recheck by her PCP in 1 to 2 days.  Instructed her to go to the ED if she has signs of worsening infection.  Education provided on cellulitis.  Patient agrees to plan of care.   Final Clinical Impressions(s) / UC Diagnoses   Final diagnoses:  Cellulitis of right ankle     Discharge Instructions      Take the antibiotic as directed.    Keep your wound clean and dry.  Wash it gently twice a day with soap and water.  Apply an antibiotic cream twice a day.    Go to the emergency department if you have signs of worsening infection such as fever, red streaks, increased redness, pus-like drainage, or  other concerns.    Follow-up with your primary care provider in 1 to 2 days for recheck of your wound.         ED Prescriptions     Medication Sig Dispense Auth. Provider   doxycycline (VIBRAMYCIN) 100 MG capsule Take 1 capsule (100 mg total) by mouth 2 (two) times daily for 7 days. 14 capsule Sharion Balloon, NP      PDMP not reviewed this encounter.   Sharion Balloon, NP 10/19/20 1651

## 2020-10-20 ENCOUNTER — Other Ambulatory Visit: Payer: Self-pay

## 2020-10-20 ENCOUNTER — Encounter: Payer: Self-pay | Admitting: Family Medicine

## 2020-10-20 ENCOUNTER — Ambulatory Visit (INDEPENDENT_AMBULATORY_CARE_PROVIDER_SITE_OTHER): Payer: Medicare HMO | Admitting: Family Medicine

## 2020-10-20 VITALS — BP 130/62 | HR 68 | Temp 98.0°F | Ht 65.0 in | Wt 170.5 lb

## 2020-10-20 DIAGNOSIS — D508 Other iron deficiency anemias: Secondary | ICD-10-CM | POA: Diagnosis not present

## 2020-10-20 DIAGNOSIS — F330 Major depressive disorder, recurrent, mild: Secondary | ICD-10-CM | POA: Diagnosis not present

## 2020-10-20 DIAGNOSIS — E1159 Type 2 diabetes mellitus with other circulatory complications: Secondary | ICD-10-CM | POA: Diagnosis not present

## 2020-10-20 DIAGNOSIS — I152 Hypertension secondary to endocrine disorders: Secondary | ICD-10-CM | POA: Diagnosis not present

## 2020-10-20 DIAGNOSIS — E785 Hyperlipidemia, unspecified: Secondary | ICD-10-CM | POA: Diagnosis not present

## 2020-10-20 DIAGNOSIS — R69 Illness, unspecified: Secondary | ICD-10-CM | POA: Diagnosis not present

## 2020-10-20 DIAGNOSIS — Z6830 Body mass index (BMI) 30.0-30.9, adult: Secondary | ICD-10-CM

## 2020-10-20 DIAGNOSIS — E1169 Type 2 diabetes mellitus with other specified complication: Secondary | ICD-10-CM

## 2020-10-20 DIAGNOSIS — Z23 Encounter for immunization: Secondary | ICD-10-CM

## 2020-10-20 DIAGNOSIS — Z0000 Encounter for general adult medical examination without abnormal findings: Secondary | ICD-10-CM | POA: Diagnosis not present

## 2020-10-20 DIAGNOSIS — S81802A Unspecified open wound, left lower leg, initial encounter: Secondary | ICD-10-CM | POA: Diagnosis not present

## 2020-10-20 DIAGNOSIS — L03116 Cellulitis of left lower limb: Secondary | ICD-10-CM | POA: Diagnosis not present

## 2020-10-20 DIAGNOSIS — I4819 Other persistent atrial fibrillation: Secondary | ICD-10-CM

## 2020-10-20 DIAGNOSIS — E669 Obesity, unspecified: Secondary | ICD-10-CM

## 2020-10-20 DIAGNOSIS — E119 Type 2 diabetes mellitus without complications: Secondary | ICD-10-CM | POA: Diagnosis not present

## 2020-10-20 LAB — HM DIABETES FOOT EXAM

## 2020-10-20 MED ORDER — CEFTRIAXONE SODIUM 1 G IJ SOLR
1.0000 g | Freq: Once | INTRAMUSCULAR | Status: AC
  Administered 2020-10-20: 1 g via INTRAMUSCULAR

## 2020-10-20 NOTE — Addendum Note (Signed)
Addended by: Carter Kitten on: 10/20/2020 12:39 PM   Modules accepted: Orders

## 2020-10-20 NOTE — Assessment & Plan Note (Addendum)
Minimal improvement with 2 doses of doxycycline in diabetic... pain continued in lower leg.  Will treat with Rocephin 1 gm x 1 . Continue doxycycline  Close follow up in 3 days with Dr. Lorelei Pont.  If  Redness spreading over weekend... pt knows to call on call MD.

## 2020-10-20 NOTE — Progress Notes (Signed)
Patient ID: [name], female    DOB: Nov 21, 1945, 75 y.o.   MRN: [id number]  This visit was conducted in person.  BP 130/62   Pulse 68   Temp 98 F (36.7 C) (Temporal)   Ht '5\' 5"'$  (1.651 m)   Wt 170 lb 8 oz (77.3 kg)   LMP  (LMP Unknown)   SpO2 98%   BMI 28.37 kg/m    CC: Chief Complaint  Patient presents with   Annual Exam    Part 2    Subjective:   HPI: [name] is a 75 y.o. female presenting on 10/20/2020 for Annual Exam (Part 2)  The patient presents for complete physical and review of chronic health problems. He/She also has the following acute concerns today:   MDD, well controlled on lexapro 10 mg daily Flowsheet Row Chronic Care Management from 09/08/2020 in [place] at [facility] Total Score 1      Diabetes:   Stable control on Tradjenta  and metfomrin 500 mg XR 2 times daily followed by ENDO. Lab Results  Component Value Date   HGBA1C 6.7 (H) 09/26/2020  Using medications without difficulties: Hypoglycemic episodes: none Hyperglycemic episodes:none Feet problems: She injured her right leg... after pressure washer hit ankle... redness started spreading.  Started  on doxycycline 100 mg BID x 7 days.   No change overnight, no fever. Painful.  Applying neosporin. Blood Sugars averaging: FBS 90-120, this AM 135 eye exam within last year: yes a [facility]   Elevated Cholesterol:  LDL at goal  < 100 on atorvastatin 10 mg daily Lab Results  Component Value Date   CHOL 127 09/26/2020   HDL 42.50 09/26/2020   LDLCALC 74 09/26/2020   LDLDIRECT 163.1 07/04/2011   TRIG 51.0 09/26/2020   CHOLHDL 3 09/26/2020  Using medications without problems: none Muscle aches:  none Diet compliance: heart healthy Exercise:  not lately given BP issues. Other complaints:    Hypertension:  Had been running low at home,  cardiology lowered metoprolol dose.. now feels better. BP Readings from Last 3 Encounters:  10/20/20 130/62  10/19/20  (!) 162/81  10/16/20 136/76  Using medication without problems or lightheadedness:  none Chest pain with exertion:none Edema:none Short of breath:none Average home BPs: Other issues: Resistant Afib followed by Cardiology.. rate controlled on Flecanide and eliquis anticoagulation.   Iron def Anemia: stable Hg 10.7 On  once daily 325 mg   No blood in stool.  Vit d def resolved on supplement  Relevant past medical, surgical, family and social history reviewed and updated as indicated. Interim medical history since our last visit reviewed. Allergies and medications reviewed and updated. Outpatient Medications Prior to Visit  Medication Sig Dispense Refill   atorvastatin (LIPITOR) 10 MG tablet TAKE 1 TABLET BY MOUTH EVERYDAY AT BEDTIME 90 tablet 0   calcium carbonate (OS-CAL - DOSED IN MG OF ELEMENTAL CALCIUM) 1250 (500 Ca) MG tablet Take 1 tablet by mouth daily with breakfast.     doxycycline (VIBRAMYCIN) 100 MG capsule Take 1 capsule (100 mg total) by mouth 2 (two) times daily for 7 days. 14 capsule 0   ELIQUIS 5 MG TABS tablet TAKE 1 TABLET BY MOUTH TWICE A DAY 60 tablet 9   escitalopram (LEXAPRO) 10 MG tablet Take 1 tablet by mouth once daily 90 tablet 0   flecainide (TAMBOCOR) 100 MG tablet TAKE 1 TABLET BY MOUTH TWICE A DAY 180 tablet 0   hydroxypropyl  methylcellulose / hypromellose (ISOPTO TEARS / GONIOVISC) 2.5 % ophthalmic solution Place 1 drop into both eyes 2 (two) times daily as needed for dry eyes.     metFORMIN (GLUCOPHAGE-XR) 500 MG 24 hr tablet TAKE 2 TABLETS (1,000 MG TOTAL) BY MOUTH 2 (TWO) TIMES DAILY WITH A MEAL. 360 tablet 1   metoprolol tartrate (LOPRESSOR) 25 MG tablet Take 0.5 tablets (12.5 mg total) by mouth 2 (two) times daily. 90 tablet 3   ONETOUCH ULTRA test strip CHECK BLOOD SUGAR 2 TIMES A DAY AS DIRECTED DX CODE E11.9 100 strip 7   solifenacin (VESICARE) 10 MG tablet TAKE 1 TABLET BY MOUTH EVERY DAY 30 tablet 0   TRADJENTA 5 MG TABS tablet TAKE 1 TABLET BY  MOUTH EVERY DAY 30 tablet 11   traZODone (DESYREL) 50 MG tablet TAKE 1 TABLET BY MOUTH EVERY DAY AT BEDTIME AS NEEDED FOR SLEEP 30 tablet 5   cephALEXin (KEFLEX) 500 MG capsule Take 1 capsule (500 mg total) by mouth 3 (three) times daily. 15 capsule 0   No facility-administered medications prior to visit.     Per HPI unless specifically indicated in ROS section below Review of Systems  Constitutional:  Negative for fatigue, fever and unexpected weight change.  HENT:  Negative for congestion, ear pain, sinus pressure, sneezing, sore throat and trouble swallowing.   Eyes:  Negative for pain and itching.  Respiratory:  Negative for cough, shortness of breath and wheezing.   Cardiovascular:  Negative for chest pain, palpitations and leg swelling.  Gastrointestinal:  Negative for abdominal pain, blood in stool, constipation, diarrhea and nausea.  Genitourinary:  Negative for difficulty urinating, dysuria, hematuria, menstrual problem and vaginal discharge.  Skin:  Positive for rash.  Neurological:  Negative for syncope, weakness, light-headedness, numbness and headaches.  Psychiatric/Behavioral:  Negative for confusion and dysphoric mood. The patient is not nervous/anxious.    Objective:  BP 130/62   Pulse 68   Temp 98 F (36.7 C) (Temporal)   Ht '5\' 5"'$  (1.651 m)   Wt 170 lb 8 oz (77.3 kg)   LMP  (LMP Unknown)   SpO2 98%   BMI 28.37 kg/m   Wt Readings from Last 3 Encounters:  10/20/20 170 lb 8 oz (77.3 kg)  10/16/20 171 lb (77.6 kg)  07/26/20 173 lb 4 oz (78.6 kg)      Physical Exam  Diabetic foot exam: Normal inspection No skin breakdown No calluses  Normal DP pulses Normal sensation to light touch and monofilament Nails normal     Results for orders placed or performed in visit on 09/26/20  IBC + Ferritin  Result Value Ref Range   Iron 68 42 - 145 ug/dL   Transferrin 280.0 212.0 - 360.0 mg/dL   Saturation Ratios 17.3 (L) 20.0 - 50.0 %   Ferritin 13.8 10.0 - 291.0  ng/mL  Vitamin B12  Result Value Ref Range   Vitamin B-12 961 (H) 211 - 911 pg/mL  VITAMIN D 25 Hydroxy (Vit-D Deficiency, Fractures)  Result Value Ref Range   VITD 67.69 30.00 - 100.00 ng/mL  CBC with Differential/Platelet  Result Value Ref Range   WBC 4.4 4.0 - 10.5 K/uL   RBC 3.58 (L) 3.87 - 5.11 Mil/uL   Hemoglobin 10.7 (L) 12.0 - 15.0 g/dL   HCT 31.8 (L) 36.0 - 46.0 %   MCV 88.8 78.0 - 100.0 fl   MCHC 33.6 30.0 - 36.0 g/dL   RDW 14.4 11.5 - 15.5 %   Platelets  274.0 150.0 - 400.0 K/uL   Neutrophils Relative % 54.8 43.0 - 77.0 %   Lymphocytes Relative 33.5 12.0 - 46.0 %   Monocytes Relative 8.2 3.0 - 12.0 %   Eosinophils Relative 2.8 0.0 - 5.0 %   Basophils Relative 0.7 0.0 - 3.0 %   Neutro Abs 2.4 1.4 - 7.7 K/uL   Lymphs Abs 1.5 0.7 - 4.0 K/uL   Monocytes Absolute 0.4 0.1 - 1.0 K/uL   Eosinophils Absolute 0.1 0.0 - 0.7 K/uL   Basophils Absolute 0.0 0.0 - 0.1 K/uL  Comprehensive metabolic panel  Result Value Ref Range   Sodium 138 135 - 145 mEq/L   Potassium 4.6 3.5 - 5.1 mEq/L   Chloride 103 96 - 112 mEq/L   CO2 30 19 - 32 mEq/L   Glucose, Bld 131 (H) 70 - 99 mg/dL   BUN 12 6 - 23 mg/dL   Creatinine, Ser 0.96 0.40 - 1.20 mg/dL   Total Bilirubin 0.4 0.2 - 1.2 mg/dL   Alkaline Phosphatase 47 39 - 117 U/L   AST 12 0 - 37 U/L   ALT 10 0 - 35 U/L   Total Protein 6.4 6.0 - 8.3 g/dL   Albumin 4.1 3.5 - 5.2 g/dL   GFR 58.69 (L) >60.00 mL/min   Calcium 9.4 8.4 - 10.5 mg/dL  Lipid panel  Result Value Ref Range   Cholesterol 127 0 - 200 mg/dL   Triglycerides 51.0 0.0 - 149.0 mg/dL   HDL 42.50 >39.00 mg/dL   VLDL 10.2 0.0 - 40.0 mg/dL   LDL Cholesterol 74 0 - 99 mg/dL   Total CHOL/HDL Ratio 3    NonHDL 84.49   Hemoglobin A1c  Result Value Ref Range   Hgb A1c MFr Bld 6.7 (H) 4.6 - 6.5 %    This visit occurred during the SARS-CoV-2 public health emergency.  Safety protocols were in place, including screening questions prior to the visit, additional usage of staff PPE,  and extensive cleaning of exam room while observing appropriate contact time as indicated for disinfecting solutions.   COVID 19 screen:  No recent travel or known exposure to COVID19 The patient denies respiratory symptoms of COVID 19 at this time. The importance of social distancing was discussed today.   Assessment and Plan The patient's preventative maintenance and recommended screening tests for an annual wellness exam were reviewed in full today. Brought up to date unless services declined.  Counselled on the importance of diet, exercise, and its role in overall health and mortality. The patient's FH and SH was reviewed, including their home life, tobacco status, and drug and alcohol status.   DEXA 2022 mild osteopenia.. repeat in 5 years  Mammogram:  nml 08/2020 Hep C: done  Colon: 2019 neg.. no further needed given age.  Partial hysterectomy.[name] asymptomatic.  Discussed COVID19 vaccine side effects and benefits. Strongly encouraged the patient to get the vaccine. Questions answered. Due for Td ( given injury will booster), consdier shingrix, uptodate with PNA x2    Problem List Items Addressed This Visit     Cellulitis of left lower extremity    Minimal improvement with 2 doses of doxycycline in diabetic... pain continued in lower leg.  Will treat with Rocephin 1 gm x 1 . Continue doxycycline  Close follow up in 3 days with Dr. Lorelei Pont.  If  Redness spreading over weekend... pt knows to call on call MD.      Class 1 obesity with serious comorbidity and body  mass index (BMI) of 30.0 to 30.9 in adult    Encouraged exercise, weight loss, healthy eating habits.       Controlled diabetes mellitus type II without complication (HCC)    Stable control on Tradjenta  and metfomrin 500 mg XR 2 times daily followed by ENDO.      Hyperlipidemia associated with type 2 diabetes mellitus ([place])   Hypertension associated with diabetes ([place])    Stable, chronic.  Continue current  medication.         Iron deficiency anemia     Stable at 10.7      MDD (major depressive disorder), recurrent episode, mild (HCC)    Stable, chronic.  Continue current medication.   lexapro 10 mg daily      Persistent atrial fibrillation (HCC)    followed by Cardiology.. rate controlled on flecanide and eliquis anticoagulation.      Other Visit Diagnoses     Routine general medical examination at a health care facility    -  Primary       Eliezer Lofts, MD

## 2020-10-20 NOTE — Assessment & Plan Note (Signed)
followed by Cardiology.. rate controlled on flecanide and eliquis anticoagulation.

## 2020-10-20 NOTE — Assessment & Plan Note (Signed)
Stable, chronic.  Continue current medication.   lexapro 10 mg daily

## 2020-10-20 NOTE — Patient Instructions (Addendum)
Keep area clean and dry, apply topical antibiotics. Elevate left lower leg above heart as able. Call back directly if there are further questions, or if these symptoms fail to improve as anticipated or worsen ( Ie. Increased pain, redness spreading, fever.

## 2020-10-20 NOTE — Assessment & Plan Note (Signed)
Stable, chronic.  Continue current medication.    

## 2020-10-20 NOTE — Assessment & Plan Note (Signed)
Stable at 10.7

## 2020-10-20 NOTE — Assessment & Plan Note (Signed)
Stable control on Tradjenta  and metfomrin 500 mg XR 2 times daily followed by ENDO.

## 2020-10-20 NOTE — Assessment & Plan Note (Signed)
Encouraged exercise, weight loss, healthy eating habits. ? ?

## 2020-10-23 ENCOUNTER — Encounter: Payer: Self-pay | Admitting: Family Medicine

## 2020-10-23 ENCOUNTER — Other Ambulatory Visit: Payer: Self-pay

## 2020-10-23 ENCOUNTER — Ambulatory Visit (INDEPENDENT_AMBULATORY_CARE_PROVIDER_SITE_OTHER): Payer: Medicare HMO

## 2020-10-23 ENCOUNTER — Ambulatory Visit (INDEPENDENT_AMBULATORY_CARE_PROVIDER_SITE_OTHER): Payer: Medicare HMO | Admitting: Family Medicine

## 2020-10-23 VITALS — BP 130/60 | HR 66 | Temp 96.4°F | Ht 65.0 in | Wt 171.5 lb

## 2020-10-23 DIAGNOSIS — I4819 Other persistent atrial fibrillation: Secondary | ICD-10-CM | POA: Diagnosis not present

## 2020-10-23 DIAGNOSIS — S81802A Unspecified open wound, left lower leg, initial encounter: Secondary | ICD-10-CM | POA: Diagnosis not present

## 2020-10-23 DIAGNOSIS — E119 Type 2 diabetes mellitus without complications: Secondary | ICD-10-CM

## 2020-10-23 DIAGNOSIS — L03116 Cellulitis of left lower limb: Secondary | ICD-10-CM

## 2020-10-23 MED ORDER — LEVOFLOXACIN 500 MG PO TABS: 500.0000 mg | ORAL_TABLET | Freq: Every day | ORAL | 0 refills | Status: DC

## 2020-10-23 MED ORDER — DOXYCYCLINE HYCLATE 100 MG PO TABS
100.0000 mg | ORAL_TABLET | Freq: Two times a day (BID) | ORAL | 0 refills | Status: DC
Start: 2020-10-23 — End: 2020-11-27

## 2020-10-23 NOTE — Progress Notes (Signed)
Emily Brouwer T. [place], King, [place] at [facility] [place], [address]  Phone: [phone]  FAX: [phone]  [name] - 75 y.o. female  MRN [id number]  Date of Birth: [birth year]  Date: 10/23/2020  PCP: Emily Sanders, King  Referral: Emily Sanders, King  Chief Complaint  Patient presents with   Follow-up    Cellulitis Left lower leg     This visit occurred during the SARS-CoV-2 public health emergency.  Safety protocols were in place, including screening questions prior to the visit, additional usage of staff PPE, and extensive cleaning of exam room while observing appropriate contact time as indicated for disinfecting solutions.   Subjective:   [name] is a 75 y.o. very pleasant female patient with Body mass index is 28.54 kg/m. who presents with the following:  10/15/2020 - DOI?  Very pleasant lady was pressure washing on the above date, and her leg was hit with some water from the pressure washer and induced a wound visualized below.  Subsequently on the 18th that she went to urgent care in the afternoon and was started on doxycycline.  The following day she saw my partner Dr. Diona King and was continued on doxycycline and she also received a Rocephin injection.  She continues to have some difficulties right now with some redness about the wound without improvement on doxycycline and Rocephin.  She does not have fever or any other systemic symptoms.  She does have well-controlled diabetes.  10/19/2020 went to UC - 4PM 10/20/2020 - saw [name]  F/u Cellulitis from 10/20/2020 ov with Dr. B:  Hit 2 weeks ago with a pressure washer.   Rocephin 1 g on 10/20/2020 Today is day 5 of doxycycline   Culture Wound Add LVQ Add more doxy  Lab Results  Component Value Date   HGBA1C 6.7 (H) 09/26/2020     Review of Systems is noted in the HPI, as appropriate  Objective:   BP 130/60   Pulse 66   Temp (!)  96.4 F (35.8 C) (Temporal)   Ht '5\' 5"'$  (1.651 m)   Wt 171 lb 8 oz (77.8 kg)   LMP  (LMP Unknown)   SpO2 96%   BMI 28.54 kg/m   GEN: No acute distress; alert,appropriate. PULM: Breathing comfortably in no respiratory distress PSYCH: Normally interactive.     She does have redness about the wound that is tender to palpation. There is no expressible pus from the wound, but there is some wet tissue in the wound itself.  Laboratory and Imaging Data:  Assessment and Plan:     ICD-10-CM   1. Cellulitis of left lower extremity  L03.116 levofloxacin (LEVAQUIN) 500 MG tablet    doxycycline (VIBRA-TABS) 100 MG tablet    AMB referral to wound care center    2. Leg wound, left, initial encounter  S81.802A levofloxacin (LEVAQUIN) 500 MG tablet    doxycycline (VIBRA-TABS) 100 MG tablet    AMB referral to wound care center    WOUND CULTURE    3. Controlled type 2 diabetes mellitus without complication, without long-term current use of insulin (HCC)  E11.9 levofloxacin (LEVAQUIN) 500 MG tablet    doxycycline (VIBRA-TABS) 100 MG tablet    AMB referral to wound care center    Microalbumin, urine     Pressure washing induced wound with surrounding cellulitis.  No improvement with doxycycline or Rocephin alone.  I also had one of my  other partners evaluate the wound with me, and I did flush the topical wound with saline prior to obtaining a wound culture.  Add Levaquin for potential Pseudomonas coverage.  Hopefully, the central wound is developing granulation tissue only, but I do not like the fact that there is surrounding redness and discomfort with palpation and it is not improved compared to prior imaging..  In a diabetic patient, I am also going to consult wound for their help in this case.  Meds ordered this encounter  Medications   levofloxacin (LEVAQUIN) 500 MG tablet    Sig: Take 1 tablet (500 mg total) by mouth daily.    Dispense:  10 tablet    Refill:  0   doxycycline  (VIBRA-TABS) 100 MG tablet    Sig: Take 1 tablet (100 mg total) by mouth 2 (two) times daily.    Dispense:  14 tablet    Refill:  0   Medications Discontinued During This Encounter  Medication Reason   doxycycline (VIBRAMYCIN) 100 MG capsule    Orders Placed This Encounter  Procedures   WOUND CULTURE   AMB referral to wound care center    Follow-up: No follow-ups on file.  [employer] One speech-to-text software was used for transcription in this dictation.  Possible transcriptional errors can occur using Editor, commissioning.   Signed,  [name]   Outpatient Encounter Medications as of 10/23/2020  Medication Sig   atorvastatin (LIPITOR) 10 MG tablet TAKE 1 TABLET BY MOUTH EVERYDAY AT BEDTIME   calcium carbonate (OS-CAL - DOSED IN MG OF ELEMENTAL CALCIUM) 1250 (500 Ca) MG tablet Take 1 tablet by mouth daily with breakfast.   doxycycline (VIBRA-TABS) 100 MG tablet Take 1 tablet (100 mg total) by mouth 2 (two) times daily.   ELIQUIS 5 MG TABS tablet TAKE 1 TABLET BY MOUTH TWICE A DAY   escitalopram (LEXAPRO) 10 MG tablet Take 1 tablet by mouth once daily   flecainide (TAMBOCOR) 100 MG tablet TAKE 1 TABLET BY MOUTH TWICE A DAY   hydroxypropyl methylcellulose / hypromellose (ISOPTO TEARS / GONIOVISC) 2.5 % ophthalmic solution Place 1 drop into both eyes 2 (two) times daily as needed for dry eyes.   levofloxacin (LEVAQUIN) 500 MG tablet Take 1 tablet (500 mg total) by mouth daily.   metFORMIN (GLUCOPHAGE-XR) 500 MG 24 hr tablet TAKE 2 TABLETS (1,000 MG TOTAL) BY MOUTH 2 (TWO) TIMES DAILY WITH A MEAL.   metoprolol tartrate (LOPRESSOR) 25 MG tablet Take 0.5 tablets (12.5 mg total) by mouth 2 (two) times daily.   ONETOUCH ULTRA test strip CHECK BLOOD SUGAR 2 TIMES A DAY AS DIRECTED DX CODE E11.9   solifenacin (VESICARE) 10 MG tablet TAKE 1 TABLET BY MOUTH EVERY DAY   TRADJENTA 5 MG TABS tablet TAKE 1 TABLET BY MOUTH EVERY DAY   traZODone (DESYREL) 50 MG tablet TAKE 1 TABLET  BY MOUTH EVERY DAY AT BEDTIME AS NEEDED FOR SLEEP   [DISCONTINUED] doxycycline (VIBRAMYCIN) 100 MG capsule Take 1 capsule (100 mg total) by mouth 2 (two) times daily for 7 days.   No facility-administered encounter medications on file as of 10/23/2020.

## 2020-10-24 LAB — CUP PACEART REMOTE DEVICE CHECK
Date Time Interrogation Session: 20220818210251
Implantable Pulse Generator Implant Date: 20210816

## 2020-10-25 ENCOUNTER — Ambulatory Visit: Payer: Medicare HMO | Admitting: Family Medicine

## 2020-10-26 ENCOUNTER — Encounter: Payer: Self-pay | Admitting: Family Medicine

## 2020-10-26 DIAGNOSIS — Z0100 Encounter for examination of eyes and vision without abnormal findings: Secondary | ICD-10-CM | POA: Diagnosis not present

## 2020-10-26 LAB — WOUND CULTURE
MICRO NUMBER:: 12273325
SPECIMEN QUALITY:: ADEQUATE

## 2020-10-26 LAB — MICROALBUMIN, URINE: Microalb, Ur: 2.8 mg/dL

## 2020-11-01 ENCOUNTER — Encounter: Payer: Medicare HMO | Attending: Internal Medicine | Admitting: Internal Medicine

## 2020-11-01 ENCOUNTER — Other Ambulatory Visit: Payer: Self-pay

## 2020-11-01 DIAGNOSIS — I4811 Longstanding persistent atrial fibrillation: Secondary | ICD-10-CM | POA: Diagnosis not present

## 2020-11-01 DIAGNOSIS — S81809A Unspecified open wound, unspecified lower leg, initial encounter: Secondary | ICD-10-CM | POA: Diagnosis not present

## 2020-11-01 DIAGNOSIS — Z7901 Long term (current) use of anticoagulants: Secondary | ICD-10-CM | POA: Insufficient documentation

## 2020-11-01 DIAGNOSIS — S81801A Unspecified open wound, right lower leg, initial encounter: Secondary | ICD-10-CM | POA: Diagnosis not present

## 2020-11-01 DIAGNOSIS — Z7984 Long term (current) use of oral hypoglycemic drugs: Secondary | ICD-10-CM | POA: Insufficient documentation

## 2020-11-01 DIAGNOSIS — E119 Type 2 diabetes mellitus without complications: Secondary | ICD-10-CM | POA: Diagnosis not present

## 2020-11-01 DIAGNOSIS — X58XXXA Exposure to other specified factors, initial encounter: Secondary | ICD-10-CM | POA: Diagnosis not present

## 2020-11-01 NOTE — Progress Notes (Signed)
[name], King ([id number]) Visit Report for 11/01/2020 Chief Complaint Document Details Patient Name: [name], [name]. Date of Service: 11/01/2020 8:45 AM Medical Record Number: [id number] Patient Account Number: [id number] Date of Birth/Sex: 1945/04/23 (75 y.o. Female) Treating RN: Carlene Coria Primary Care Provider: Eliezer Lofts Other Clinician: Referring Provider: Owens Loffler Treating Provider/Extender: Yaakov Guthrie in Treatment: 0 Information Obtained from: Patient Chief Complaint Right lower extremity wound Electronic Signature(s) Signed: 11/01/2020 10:09:22 AM By: Kalman Shan DO Entered By: Kalman Shan on 11/01/2020 09:59:06 Araki, Leighton Parody ([id number]) -------------------------------------------------------------------------------- Debridement Details Patient Name: [name]. Date of Service: 11/01/2020 8:45 AM Medical Record Number: [id number] Patient Account Number: [id number] Date of Birth/Sex: 1945/04/10 (75 y.o. Female) Treating RN: Carlene Coria Primary Care Provider: Eliezer Lofts Other Clinician: Referring Provider: Owens Loffler Treating Provider/Extender: Yaakov Guthrie in Treatment: 0 Debridement Performed for Wound #1 Right,Distal,Medial Lower Leg Assessment: Performed By: Physician Kalman Shan, MD Debridement Type: Chemical/Enzymatic/Mechanical Agent Used: saline and gauze Severity of Tissue Pre Debridement: Fat layer exposed Level of Consciousness (Pre- Awake and Alert procedure): Pre-procedure Verification/Time Out Yes - 09:52 Taken: Start Time: 09:52 Pain Control: Lidocaine 4% Topical Solution Instrument: Other : saline gauze Bleeding: None End Time: 09:54 Procedural Pain: 0 Post Procedural Pain: 0 Response to Treatment: Procedure was tolerated well Level of Consciousness (Post- Awake and Alert procedure): Post Debridement Measurements of Total Wound Length: (cm) 1.5 Width: (cm) 0.8 Depth: (cm)  0.1 Volume: (cm) 0.094 Character of Wound/Ulcer Post Debridement: Improved Severity of Tissue Post Debridement: Fat layer exposed Post Procedure Diagnosis Same as Pre-procedure Electronic Signature(s) Signed: 11/01/2020 10:09:22 AM By: Kalman Shan DO Signed: 11/01/2020 4:57:35 PM By: Carlene Coria RN Entered By: Carlene Coria on 11/01/2020 09:54:00 Eich, Leighton Parody ([id number]) -------------------------------------------------------------------------------- HPI Details Patient Name: [name]. Date of Service: 11/01/2020 8:45 AM Medical Record Number: [id number] Patient Account Number: [id number] Date of Birth/Sex: 21-Jul-1945 (75 y.o. Female) Treating RN: Carlene Coria Primary Care Provider: Eliezer Lofts Other Clinician: Referring Provider: Owens Loffler Treating Provider/Extender: Yaakov Guthrie in Treatment: 0 History of Present Illness HPI Description: Admission 8/31 Ms. [name] is a 75 year old female with a past medical history of controlled type 2 diabetes on oral medication, persistent atrial fibrillation on Eliquis that presents to the clinic for a right lower extremity wound. The wound occurred on 8/14 when she was pressure washing and hit her leg. She visited the ED and was treated for cellulitis with doxycycline. After 2 doses she visited her family medicine office and they gave her an injection of Rocephin since symptoms remained. She had close follow up again and they started her Levaquin for persistent cellulitis. She is not using any wound care dressings currently. She originally used hydrogen peroxide to the wound. She now reports improvement in redness to the surrounding wound with Levaquin. She also reports improvement in wound healing over the past week. She currently denies signs of infection. Electronic Signature(s) Signed: 11/01/2020 10:09:22 AM By: Kalman Shan DO Entered By: Kalman Shan on 11/01/2020 10:08:13 [name]  ([id number]) -------------------------------------------------------------------------------- Physical Exam Details Patient Name: Emily, King. Date of Service: 11/01/2020 8:45 AM Medical Record Number: [id number] Patient Account Number: [id number] Date of Birth/Sex: [birth year] (75 y.o. Female) Treating RN: Carlene Coria Primary Care Provider: Eliezer Lofts Other Clinician: Referring Provider: Owens Loffler Treating Provider/Extender: Yaakov Guthrie in Treatment: 0 Constitutional . Cardiovascular . Psychiatric . Notes Right lower extremity: To the lower distal medial aspect there is an open wound with nonviable tissue and  granulation tissue present. No surrounding erythema or increased warmth noted. No drainage noted. Appears well-healing Electronic Signature(s) Signed: 11/01/2020 10:09:22 AM By: Kalman Shan DO Entered By: Kalman Shan on 11/01/2020 10:08:25 [name] ([id number]) -------------------------------------------------------------------------------- Physician Orders Details Patient Name: [name]. Date of Service: 11/01/2020 8:45 AM Medical Record Number: [id number] Patient Account Number: [id number] Date of Birth/Sex: [birth year] (75 y.o. Female) Treating RN: Carlene Coria Primary Care Provider: Eliezer Lofts Other Clinician: Referring Provider: Owens Loffler Treating Provider/Extender: Yaakov Guthrie in Treatment: 0 Verbal / Phone Orders: No Diagnosis Coding Follow-up Appointments o Return Appointment in 1 week. Bathing/ Shower/ Hygiene o May shower; gently cleanse wound with antibacterial soap, rinse and pat dry prior to dressing wounds Edema Control - Lymphedema / Segmental Compressive Device / Other o Elevate, Exercise Daily and Avoid Standing for Long Periods of Time. o Elevate legs to the level of the heart and pump ankles as often as possible o Elevate leg(s) parallel to the floor when sitting. Wound  Treatment Wound #1 - Lower Leg Wound Laterality: Right, Medial, Distal Cleanser: Byram Ancillary Kit - 15 Day Supply (DME) (Generic) 1 x Per Day/30 Days Discharge Instructions: Use supplies as instructed; Kit contains: (15) Saline Bullets; (15) 3x3 Gauze; 15 pr Gloves Cleanser: Normal Saline (DME) (Generic) 1 x Per Day/30 Days Discharge Instructions: Wash your hands with soap and water. Remove old dressing, discard into plastic bag and place into trash. Cleanse the wound with Normal Saline prior to applying a clean dressing using gauze sponges, not tissues or cotton balls. Do not scrub or use excessive force. Pat dry using gauze sponges, not tissue or cotton balls. Cleanser: Soap and Water 1 x Per Day/30 Days Discharge Instructions: Gently cleanse wound with antibacterial soap, rinse and pat dry prior to dressing wounds Primary Dressing: Hydrofera Blue Ready Transfer Foam, 4x5 (in/in) 1 x Per Day/30 Days Discharge Instructions: Apply Hydrofera Blue Ready to wound bed as directed Primary Dressing: Santyl Collagenase Ointment, 30 (gm), tube 1 x Per Day/30 Days Secondary Dressing: Zetuvit Plus Silicone Border Dressing 5x5 (in/in) (DME) (Generic) 1 x Per Day/30 Days Electronic Signature(s) Signed: 11/01/2020 10:09:22 AM By: Kalman Shan DO Signed: 11/01/2020 4:57:35 PM By: Carlene Coria RN Entered By: Carlene Coria on 11/01/2020 10:05:05 Mckeough, Leighton Parody ([id number]) -------------------------------------------------------------------------------- Problem List Details Patient Name: JEREMIE, ABDELAZIZ K. Date of Service: 11/01/2020 8:45 AM Medical Record Number: [id number] Patient Account Number: [id number] Date of Birth/Sex: 08-25-1945 (75 y.o. Female) Treating RN: Carlene Coria Primary Care Provider: Eliezer Lofts Other Clinician: Referring Provider: Owens Loffler Treating Provider/Extender: Yaakov Guthrie in Treatment: 0 Active Problems ICD-10 Encounter Code Description Active Date  MDM Diagnosis S81.801A Unspecified open wound, right lower leg, initial encounter 11/01/2020 No Yes E11.9 Type 2 diabetes mellitus without complications 9/48/5462 No Yes I48.11 Longstanding persistent atrial fibrillation 11/01/2020 No Yes Z79.01 Long term (current) use of anticoagulants 11/01/2020 No Yes Inactive Problems Resolved Problems Electronic Signature(s) Signed: 11/01/2020 10:09:22 AM By: Kalman Shan DO Entered By: Kalman Shan on 11/01/2020 09:57:14 Mcadam, Leighton Parody ([id number]) -------------------------------------------------------------------------------- Progress Note Details Patient Name: [name]. Date of Service: 11/01/2020 8:45 AM Medical Record Number: [id number] Patient Account Number: [id number] Date of Birth/Sex: 06-07-1945 (75 y.o. Female) Treating RN: Carlene Coria Primary Care Provider: Eliezer Lofts Other Clinician: Referring Provider: Owens Loffler Treating Provider/Extender: Yaakov Guthrie in Treatment: 0 Subjective Chief Complaint Information obtained from Patient Right lower extremity wound History of Present Illness (HPI) Admission 8/31 Ms. Cheryle Dark is a 75 year old female with a  past medical history of controlled type 2 diabetes on oral medication, persistent atrial fibrillation on Eliquis that presents to the clinic for a right lower extremity wound. The wound occurred on 8/14 when she was pressure washing and hit her leg. She visited the ED and was treated for cellulitis with doxycycline. After 2 doses she visited her family medicine office and they gave her an injection of Rocephin since symptoms remained. She had close follow up again and they started her Levaquin for persistent cellulitis. She is not using any wound care dressings currently. She originally used hydrogen peroxide to the wound. She now reports improvement in redness to the surrounding wound with Levaquin. She also reports improvement in wound healing over the  past week. She currently denies signs of infection. Patient History Information obtained from Patient. Allergies No Known Allergies Social History Never smoker, Marital Status - Married, Alcohol Use - Never, Drug Use - No History, Caffeine Use - Daily. Medical History Cardiovascular Patient has history of Arrhythmia - afib, Hypertension Endocrine Patient has history of Type II Diabetes Patient is treated with Oral Agents. Blood sugar is tested. Blood sugar results noted at the following times: Breakfast - 125, Dinner - 110. Review of Systems (ROS) Eyes Complains or has symptoms of Glasses / Contacts. Integumentary (Skin) Complains or has symptoms of Wounds. Denies complaints or symptoms of Bleeding or bruising tendency, Breakdown, Swelling. Objective Constitutional Vitals Time Taken: 9:03 AM, Height: 65 in, Source: Stated, Weight: 167 lbs, Source: Stated, BMI: 27.8, Temperature: 98.1 F, Pulse: 56 bpm, Respiratory Rate: 18 breaths/min, Blood Pressure: 122/69 mmHg, Capillary Blood Glucose: 126 mg/dl. General Notes: CBG per patient General Notes: Right lower extremity: To the lower distal medial aspect there is an open wound with nonviable tissue and granulation tissue present. No surrounding erythema or increased warmth noted. No drainage noted. Appears well-healing Integumentary (Hair, Skin) Wound #1 status is Open. Original cause of wound was Trauma. The date acquired was: 10/02/2020. The wound is located on the Right,Distal,Medial Lower Leg. The wound measures 1.5cm length x 0.8cm width x 0.1cm depth; 0.942cm^2 area and 0.094cm^3 volume. There is Fat Layer (Subcutaneous Tissue) exposed. There is no tunneling or undermining noted. There is a none present amount of drainage noted. DEMARIS, BOUSQUET K. ([id number]) There is no granulation within the wound bed. There is a large (67-100%) amount of necrotic tissue within the wound bed including Eschar and Adherent Slough. Assessment Active  Problems ICD-10 Unspecified open wound, right lower leg, initial encounter Type 2 diabetes mellitus without complications Longstanding persistent atrial fibrillation Long term (current) use of anticoagulants Patient presents to the clinic for a 2-week history of slow healing wound to the right lower extremity from tauma. She had persistent cellulitis that eventually resolved with Levaquin. She currently does not do dressing changes. The wound appears well-healing today with no signs of infection. I recommended using Hydrofera Blue on this daily. Follow-up in 1 week. Procedures Wound #1 Pre-procedure diagnosis of Wound #1 is a Diabetic Wound/Ulcer of the Lower Extremity located on the Right,Distal,Medial Lower Leg .Severity of Tissue Pre Debridement is: Fat layer exposed. There was a Chemical/Enzymatic/Mechanical debridement performed by Kalman Shan, MD. With the following instrument(s): saline gauze after achieving pain control using Lidocaine 4% Topical Solution. Other agent used was saline and gauze. A time out was conducted at 09:52, prior to the start of the procedure. There was no bleeding. The procedure was tolerated well with a pain level of 0 throughout and a pain level of 0  following the procedure. Post Debridement Measurements: 1.5cm length x 0.8cm width x 0.1cm depth; 0.094cm^3 volume. Character of Wound/Ulcer Post Debridement is improved. Severity of Tissue Post Debridement is: Fat layer exposed. Post procedure Diagnosis Wound #1: Same as Pre-Procedure Plan Follow-up Appointments: Return Appointment in 1 week. Bathing/ Shower/ Hygiene: May shower; gently cleanse wound with antibacterial soap, rinse and pat dry prior to dressing wounds Edema Control - Lymphedema / Segmental Compressive Device / Other: Elevate, Exercise Daily and Avoid Standing for Long Periods of Time. Elevate legs to the level of the heart and pump ankles as often as possible Elevate leg(s) parallel to the  floor when sitting. WOUND #1: - Lower Leg Wound Laterality: Right, Medial, Distal Cleanser: Byram Ancillary Kit - 15 Day Supply (DME) (Generic) 1 x Per Day/30 Days Discharge Instructions: Use supplies as instructed; Kit contains: (15) Saline Bullets; (15) 3x3 Gauze; 15 pr Gloves Cleanser: Normal Saline (DME) (Generic) 1 x Per Day/30 Days Discharge Instructions: Wash your hands with soap and water. Remove old dressing, discard into plastic bag and place into trash. Cleanse the wound with Normal Saline prior to applying a clean dressing using gauze sponges, not tissues or cotton balls. Do not scrub or use excessive force. Pat dry using gauze sponges, not tissue or cotton balls. Cleanser: Soap and Water 1 x Per Day/30 Days Discharge Instructions: Gently cleanse wound with antibacterial soap, rinse and pat dry prior to dressing wounds Primary Dressing: Hydrofera Blue Ready Transfer Foam, 4x5 (in/in) 1 x Per Day/30 Days Discharge Instructions: Apply Hydrofera Blue Ready to wound bed as directed Primary Dressing: Santyl Collagenase Ointment, 30 (gm), tube 1 x Per Day/30 Days Secondary Dressing: Zetuvit Plus Silicone Non-bordered 5x5 (in/in) (DME) (Generic) 1 x Per Day/30 Days 1. Hydrofera Blue 2. Follow-up in 1 week TYLASIA, FLETCHALL ([id number]) Electronic Signature(s) Signed: 11/01/2020 10:09:22 AM By: Kalman Shan DO Entered By: Kalman Shan on 11/01/2020 10:08:45 [name] ([id number]) -------------------------------------------------------------------------------- ROS/PFSH Details Patient Name: [name]. Date of Service: 11/01/2020 8:45 AM Medical Record Number: [id number] Patient Account Number: [id number] Date of Birth/Sex: 09-24-1945 (75 y.o. Female) Treating RN: Carlene Coria Primary Care Provider: Eliezer Lofts Other Clinician: Referring Provider: Owens Loffler Treating Provider/Extender: Yaakov Guthrie in Treatment: 0 Information Obtained  From Patient Eyes Complaints and Symptoms: Positive for: Glasses / Contacts Integumentary (Skin) Complaints and Symptoms: Positive for: Wounds Negative for: Bleeding or bruising tendency; Breakdown; Swelling Cardiovascular Medical History: Positive for: Arrhythmia - afib; Hypertension Endocrine Medical History: Positive for: Type II Diabetes Time with diabetes: 10 Treated with: Oral agents Blood sugar tested every day: Yes Tested : 2 Blood sugar testing results: Breakfast: 125; Dinner: 110 Immunizations Pneumococcal Vaccine: Received Pneumococcal Vaccination: Yes Received Pneumococcal Vaccination On or After 60th Birthday: Yes Implantable Devices None Family and Social History Never smoker; Marital Status - Married; Alcohol Use: Never; Drug Use: No History; Caffeine Use: Daily; Financial Concerns: No; Food, Clothing or Shelter Needs: No; Support System Lacking: No; Transportation Concerns: No Electronic Signature(s) Signed: 11/01/2020 10:09:22 AM By: Kalman Shan DO Signed: 11/01/2020 4:57:35 PM By: Carlene Coria RN Entered By: Carlene Coria on 11/01/2020 09:35:27 Gravley, Leighton Parody ([id number]) -------------------------------------------------------------------------------- SuperBill Details Patient Name: [name]. Date of Service: 11/01/2020 Medical Record Number: [id number] Patient Account Number: [id number] Date of Birth/Sex: 1946/01/19 (75 y.o. Female) Treating RN: Carlene Coria Primary Care Provider: Eliezer Lofts Other Clinician: Referring Provider: Owens Loffler Treating Provider/Extender: Yaakov Guthrie in Treatment: 0 Diagnosis Coding ICD-10 Codes Code Description S81.801A Unspecified open wound,  right lower leg, initial encounter E11.9 Type 2 diabetes mellitus without complications Q58.34 Longstanding persistent atrial fibrillation Z79.01 Long term (current) use of anticoagulants Facility Procedures CPT4 Code: [id number] Description: 99214 -  WOUND CARE VISIT-LEV 4 EST PT Modifier: Quantity: 1 Physician Procedures CPT4 Code: 5271292 Description: WC PHYS LEVEL 3 o NEW PT Modifier: Quantity: 1 CPT4 Code: Description: ICD-10 Diagnosis Description S81.801A Unspecified open wound, right lower leg, initial encounter E11.9 Type 2 diabetes mellitus without complications T09.03 Longstanding persistent atrial fibrillation Z79.01 Long term (current) use of  anticoagulants Modifier: Quantity: Electronic Signature(s) Signed: 11/01/2020 10:09:22 AM By: Kalman Shan DO Entered By: Kalman Shan on 11/01/2020 10:09:01

## 2020-11-01 NOTE — Progress Notes (Signed)
[name], [name] ([id number]) Visit Report for 11/01/2020 Abuse/Suicide Risk Screen Details Patient Name: [name], [name]. Date of Service: 11/01/2020 8:45 AM Medical Record Number: [id number] Patient Account Number: [id number] Date of Birth/Sex: 1945/12/28 (75 y.o. Female) Treating RN: Carlene Coria Primary Care Ceara Wrightson: Eliezer Lofts Other Clinician: Referring Soleia Badolato: Owens Loffler Treating Aleila Syverson/Extender: Yaakov Guthrie in Treatment: 0 Abuse/Suicide Risk Screen Items Answer ABUSE RISK SCREEN: Has anyone close to you tried to hurt or harm you recentlyo No Do you feel uncomfortable with anyone in your familyo No Has anyone forced you do things that you didnot want to doo No Electronic Signature(s) Signed: 11/01/2020 4:57:35 PM By: Carlene Coria RN Entered By: Carlene Coria on 11/01/2020 09:08:28 Correia, Leighton Parody ([id number]) -------------------------------------------------------------------------------- Activities of Daily Living Details Patient Name: [name], [name]. Date of Service: 11/01/2020 8:45 AM Medical Record Number: [id number] Patient Account Number: [id number] Date of Birth/Sex: 1945-09-26 (75 y.o. Female) Treating RN: Carlene Coria Primary Care Lameka Disla: Eliezer Lofts Other Clinician: Referring Teofil Maniaci: Owens Loffler Treating Darshan Solanki/Extender: Yaakov Guthrie in Treatment: 0 Activities of Daily Living Items Answer Activities of Daily Living (Please select one for each item) Drive Automobile Completely Able Take Medications Completely Able Use Telephone Completely Able Care for Appearance Completely Able Use Toilet Completely Able Bath / Shower Completely Able Dress Self Completely Able Feed Self Completely Able Walk Completely Able Get In / Out Bed Completely Able Housework Completely Able Prepare Meals Completely Able Handle Money Completely Able Shop for Self Completely Able Electronic Signature(s) Signed: 11/01/2020 4:57:35 PM By: Carlene Coria RN Entered By: Carlene Coria on 11/01/2020 09:08:54 Kellison, Leighton Parody ([id number]) -------------------------------------------------------------------------------- Education Screening Details Patient Name: [name]. Date of Service: 11/01/2020 8:45 AM Medical Record Number: [id number] Patient Account Number: [id number] Date of Birth/Sex: [birth year] (75 y.o. Female) Treating RN: Carlene Coria Primary Care Lekita Kerekes: Eliezer Lofts Other Clinician: Referring Paddy Neis: Owens Loffler Treating Odes Lolli/Extender: Yaakov Guthrie in Treatment: 0 Primary Learner Assessed: Patient Learning Preferences/Education Level/Primary Language Learning Preference: Explanation Highest Education Level: College or Above Preferred Language: English Cognitive Barrier Language Barrier: No Translator Needed: No Memory Deficit: No Emotional Barrier: No Cultural/Religious Beliefs Affecting Medical Care: No Physical Barrier Impaired Vision: Yes Glasses Impaired Hearing: No Decreased Hand dexterity: No Knowledge/Comprehension Knowledge Level: Medium Comprehension Level: High Ability to understand written instructions: High Ability to understand verbal instructions: High Motivation Anxiety Level: Anxious Cooperation: Cooperative Education Importance: Acknowledges Need Interest in Health Problems: Asks Questions Perception: Coherent Willingness to Engage in Self-Management High Activities: Readiness to Engage in Self-Management High Activities: Electronic Signature(s) Signed: 11/01/2020 4:57:35 PM By: Carlene Coria RN Entered By: Carlene Coria on 11/01/2020 09:09:33 [place]on[place], Leighton Parody ([id number]) -------------------------------------------------------------------------------- Fall Risk Assessment Details Patient Name: [name]. Date of Service: 11/01/2020 8:45 AM Medical Record Number: [id number] Patient Account Number: [id number] Date of Birth/Sex: 11/15/1945 (75 y.o.  Female) Treating RN: Carlene Coria Primary Care Kenn Rekowski: Eliezer Lofts Other Clinician: Referring Demiya Magno: Owens Loffler Treating Joylene Wescott/Extender: Yaakov Guthrie in Treatment: 0 Fall Risk Assessment Items Have you had 2 or more falls in the last 12 monthso 0 Yes Have you had any fall that resulted in injury in the last 12 monthso 0 Yes FALLS RISK SCREEN History of falling - immediate or within 3 months 0 No Secondary diagnosis (Do you have 2 or more medical diagnoseso) 0 No Ambulatory aid None/bed rest/wheelchair/nurse 0 Yes Crutches/cane/walker 0 No Furniture 0 No Intravenous therapy Access/Saline/Heparin Lock 0 No Gait/Transferring Normal/ bed rest/ wheelchair 0 Yes Weak (short steps  with or without shuffle, stooped but able to lift head while walking, may 0 No seek support from furniture) Impaired (short steps with shuffle, may have difficulty arising from chair, head down, impaired 0 No balance) Mental Status Oriented to own ability 0 Yes Electronic Signature(s) Signed: 11/01/2020 4:57:35 PM By: Carlene Coria RN Entered By: Carlene Coria on 11/01/2020 [place], Leighton Parody ([id number]) -------------------------------------------------------------------------------- Foot Assessment Details Patient Name: [name]. Date of Service: 11/01/2020 8:45 AM Medical Record Number: [id number] Patient Account Number: [id number] Date of Birth/Sex: February 24, 1946 (75 y.o. Female) Treating RN: Carlene Coria Primary Care Nikala Walsworth: Eliezer Lofts Other Clinician: Referring Evangelyn Crouse: Owens Loffler Treating Iniya Matzek/Extender: Yaakov Guthrie in Treatment: 0 Foot Assessment Items Site Locations + = Sensation present, - = Sensation absent, C = Callus, U = Ulcer R = Redness, W = Warmth, M = Maceration, PU = Pre-ulcerative lesion F = Fissure, S = Swelling, D = Dryness Assessment Right: Left: Other Deformity: No No Prior Foot Ulcer: No No Prior Amputation: No  No Charcot Joint: No No Ambulatory Status: Ambulatory Without Help Gait: Steady Electronic Signature(s) Signed: 11/01/2020 4:57:35 PM By: Carlene Coria RN Entered By: Carlene Coria on 11/01/2020 09:12:37 Hagey, Leighton Parody ([id number]) -------------------------------------------------------------------------------- Nutrition Risk Screening Details Patient Name: [name]. Date of Service: 11/01/2020 8:45 AM Medical Record Number: [id number] Patient Account Number: [id number] Date of Birth/Sex: 05-23-1945 (75 y.o. Female) Treating RN: Carlene Coria Primary Care Jaleena Viviani: Eliezer Lofts Other Clinician: Referring Zoe Nordin: Owens Loffler Treating Lorine Iannaccone/Extender: Yaakov Guthrie in Treatment: 0 Height (in): 65 Weight (lbs): 167 Body Mass Index (BMI): 27.8 Nutrition Risk Screening Items Score Screening NUTRITION RISK SCREEN: I have an illness or condition that made me change the kind and/or amount of food I eat 0 No I eat fewer than two meals per day 0 No I eat few fruits and vegetables, or milk products 0 No I have three or more drinks of beer, liquor or wine almost every day 0 No I have tooth or mouth problems that make it hard for me to eat 0 No I don't always have enough money to buy the food I need 0 No I eat alone most of the time 0 No I take three or more different prescribed or over-the-counter drugs a day 1 Yes Without wanting to, I have lost or gained 10 pounds in the last six months 0 No I am not always physically able to shop, cook and/or feed myself 0 No Nutrition Protocols Good Risk Protocol 0 No interventions needed Moderate Risk Protocol High Risk Proctocol Risk Level: Good Risk Score: 1 Electronic Signature(s) Signed: 11/01/2020 4:57:35 PM By: Carlene Coria RN Entered By: Carlene Coria on 11/01/2020 09:10:33

## 2020-11-01 NOTE — Progress Notes (Signed)
[name], [name] ([id number]) Visit Report for 11/01/2020 Allergy List Details Patient Name: [name], [name]. Date of Service: 11/01/2020 8:45 AM Medical Record Number: [id number] Patient Account Number: [id number] Date of Birth/Sex: 09-18-1945 (75 y.o. Female) Treating RN: Carlene Coria Primary Care Provider: Eliezer Lofts Other Clinician: Referring Provider: Owens Loffler Treating Provider/Extender: Yaakov Guthrie in Treatment: 0 Allergies Active Allergies No Known Allergies Allergy Notes Electronic Signature(s) Signed: 11/01/2020 4:57:35 PM By: Carlene Coria RN Entered By: Carlene Coria on 11/01/2020 09:05:00 Litzinger, Leighton Parody ([id number]) -------------------------------------------------------------------------------- Arrival Information Details Patient Name: [name]. Date of Service: 11/01/2020 8:45 AM Medical Record Number: [id number] Patient Account Number: [id number] Date of Birth/Sex: 1946/02/26 (75 y.o. Female) Treating RN: Carlene Coria Primary Care Provider: Eliezer Lofts Other Clinician: Referring Provider: Owens Loffler Treating Provider/Extender: Yaakov Guthrie in Treatment: 0 Visit Information Patient Arrived: Ambulatory Arrival Time: 08:59 Accompanied By: self Transfer Assistance: None Patient Identification Verified: Yes Secondary Verification Process Completed: Yes Patient Requires Transmission-Based Precautions: No Patient Has Alerts: No Electronic Signature(s) Signed: 11/01/2020 4:57:35 PM By: Carlene Coria RN Entered By: Carlene Coria on 11/01/2020 09:02:43 Leggette, Leighton Parody ([id number]) -------------------------------------------------------------------------------- Clinic Level of Care Assessment Details Patient Name: [name]. Date of Service: 11/01/2020 8:45 AM Medical Record Number: [id number] Patient Account Number: [id number] Date of Birth/Sex: 1946-01-27 (75 y.o. Female) Treating RN: Carlene Coria Primary Care Provider:  Eliezer Lofts Other Clinician: Referring Provider: Owens Loffler Treating Provider/Extender: Yaakov Guthrie in Treatment: 0 Clinic Level of Care Assessment Items TOOL 2 Quantity Score X - Use when only an EandM is performed on the INITIAL visit 1 0 ASSESSMENTS - Nursing Assessment / Reassessment X - General Physical Exam (combine w/ comprehensive assessment (listed just below) when performed on new 1 20 pt. evals) X- 1 25 Comprehensive Assessment (HX, ROS, Risk Assessments, Wounds Hx, etc.) ASSESSMENTS - Wound and Skin Assessment / Reassessment X - Simple Wound Assessment / Reassessment - one wound 1 5 [] - 0 Complex Wound Assessment / Reassessment - multiple wounds [] - 0 Dermatologic / Skin Assessment (not related to wound area) ASSESSMENTS - Ostomy and/or Continence Assessment and Care [] - Incontinence Assessment and Management 0 [] - 0 Ostomy Care Assessment and Management (repouching, etc.) PROCESS - Coordination of Care X - Simple Patient / Family Education for ongoing care 1 15 [] - 0 Complex (extensive) Patient / Family Education for ongoing care X- 1 10 Staff obtains Programmer, systems, Records, Test Results / Process Orders [] - 0 Staff telephones HHA, Nursing Homes / Clarify orders / etc [] - 0 Routine Transfer to another Facility (non-emergent condition) [] - 0 Routine Hospital Admission (non-emergent condition) X- 1 15 New Admissions / Biomedical engineer / Ordering NPWT, Apligraf, etc. [] - 0 Emergency Hospital Admission (emergent condition) [] - 0 Simple Discharge Coordination [] - 0 Complex (extensive) Discharge Coordination PROCESS - Special Needs [] - Pediatric / Minor Patient Management 0 [] - 0 Isolation Patient Management [] - 0 Hearing / Language / Visual special needs [] - 0 Assessment of Community assistance (transportation, D/C planning, etc.) [] - 0 Additional assistance / Altered mentation [] - 0 Support Surface(s) Assessment  (bed, cushion, seat, etc.) INTERVENTIONS - Wound Cleansing / Measurement X - Wound Imaging (photographs - any number of wounds) 1 5 [] - 0 Wound Tracing (instead of photographs) [] - 0 Simple Wound Measurement - one wound X- 1 5 Complex Wound Measurement - multiple wounds Kukla, Dexter K. ([id number]) X- 1 5 Simple Wound  Cleansing - one wound [] - 0 Complex Wound Cleansing - multiple wounds INTERVENTIONS - Wound Dressings X - Small Wound Dressing one or multiple wounds 1 10 [] - 0 Medium Wound Dressing one or multiple wounds [] - 0 Large Wound Dressing one or multiple wounds [] - 0 Application of Medications - injection INTERVENTIONS - Miscellaneous [] - External ear exam 0 [] - 0 Specimen Collection (cultures, biopsies, blood, body fluids, etc.) [] - 0 Specimen(s) / Culture(s) sent or taken to Lab for analysis [] - 0 Patient Transfer (multiple staff / Civil Service fast streamer / Similar devices) [] - 0 Simple Staple / Suture removal (25 or less) [] - 0 Complex Staple / Suture removal (26 or more) [] - 0 Hypo / Hyperglycemic Management (close monitor of Blood Glucose) X- 1 15 Ankle / Brachial Index (ABI) - do not check if billed separately Has the patient been seen at the hospital within the last three years: Yes Total Score: 130 Level Of Care: New/Established - Level 4 Electronic Signature(s) Signed: 11/01/2020 4:57:35 PM By: Carlene Coria RN Entered By: Carlene Coria on 11/01/2020 10:02:41 Wirtz, Leighton Parody ([id number]) -------------------------------------------------------------------------------- Encounter Discharge Information Details Patient Name: [name]. Date of Service: 11/01/2020 8:45 AM Medical Record Number: [id number] Patient Account Number: [id number] Date of Birth/Sex: 1946/01/17 (75 y.o. Female) Treating RN: Carlene Coria Primary Care Vinetta Brach: Eliezer Lofts Other Clinician: Referring Drelyn Pistilli: Owens Loffler Treating Sahid Borba/Extender: Yaakov Guthrie  in Treatment: 0 Encounter Discharge Information Items Post Procedure Vitals Discharge Condition: Stable Temperature (F): 98.1 Ambulatory Status: Ambulatory Pulse (bpm): 56 Discharge Destination: Home Respiratory Rate (breaths/min): 18 Transportation: Private Auto Blood Pressure (mmHg): 122/69 Accompanied By: self Schedule Follow-up Appointment: Yes Clinical Summary of Care: Patient Declined Electronic Signature(s) Signed: 11/01/2020 4:57:35 PM By: Carlene Coria RN Entered By: Carlene Coria on 11/01/2020 10:04:06 Santizo, Leighton Parody ([id number]) -------------------------------------------------------------------------------- Lower Extremity Assessment Details Patient Name: [name]. Date of Service: 11/01/2020 8:45 AM Medical Record Number: [id number] Patient Account Number: [id number] Date of Birth/Sex: 1945/04/21 (75 y.o. Female) Treating RN: Carlene Coria Primary Care Kayna Suppa: Eliezer Lofts Other Clinician: Referring Shaneil Yazdi: Owens Loffler Treating Kassidy Frankson/Extender: Yaakov Guthrie in Treatment: 0 Edema Assessment Assessed: [Left: No] [Right: No] [Left: Edema] [Right: :] Calf Left: Right: Point of Measurement: 34 cm From Medial Instep 31 cm Ankle Left: Right: Point of Measurement: 10 cm From Medial Instep 19 cm Knee To Floor Left: Right: From Medial Instep 40 cm Vascular Assessment Pulses: Dorsalis Pedis Palpable: [Left:Yes] Blood Pressure: Brachial: [Left:122] Ankle: [Left:Dorsalis Pedis: 118 0.97] Electronic Signature(s) Signed: 11/01/2020 4:57:35 PM By: Carlene Coria RN Entered By: Carlene Coria on 11/01/2020 09:21:25 Sartin, Leighton Parody ([id number]) -------------------------------------------------------------------------------- Multi Wound Chart Details Patient Name: [name]. Date of Service: 11/01/2020 8:45 AM Medical Record Number: [id number] Patient Account Number: [id number] Date of Birth/Sex: 1946/02/27 (75 y.o. Female) Treating RN: Carlene Coria Primary Care Gimena Buick: Eliezer Lofts Other Clinician: Referring Welden Hausmann: Owens Loffler Treating Joleen Stuckert/Extender: Yaakov Guthrie in Treatment: 0 Vital Signs Height(in): 65 Capillary Blood Glucose 126 (mg/dl): Weight(lbs): 167 Pulse(bpm): 92 Body Mass Index(BMI): 28 Blood Pressure(mmHg): 122/69 Temperature(F): 98.1 Respiratory Rate(breaths/min): 18 Photos: [N/A:N/A] Wound Location: Right, Distal, Medial Lower Leg N/A N/A Wounding Event: Trauma N/A N/A Primary Etiology: Diabetic Wound/Ulcer of the Lower N/A N/A Extremity Comorbid History: Hypertension, Type II Diabetes N/A N/A Date Acquired: 10/02/2020 N/A N/A Weeks of Treatment: 0 N/A N/A Wound Status: Open N/A N/A Measurements L x W x D (cm) 1.5x0.8x0.1 N/A N/A Area (cm) : 0.942  N/A N/A Volume (cm) : 0.094 N/A N/A Classification: Grade 2 N/A N/A Exudate Amount: None Present N/A N/A Granulation Amount: None Present (0%) N/A N/A Necrotic Amount: Large (67-100%) N/A N/A Necrotic Tissue: Eschar, Adherent Slough N/A N/A Exposed Structures: Fat Layer (Subcutaneous Tissue): N/A N/A Yes Fascia: No Tendon: No Muscle: No Joint: No Bone: No Epithelialization: None N/A N/A Debridement: Chemical/Enzymatic/Mechanical N/A N/A Pre-procedure Verification/Time 09:52 N/A N/A Out Taken: Pain Control: Lidocaine 4% Topical Solution N/A N/A Instrument: Other(saline gauze) N/A N/A Bleeding: None N/A N/A Procedural Pain: 0 N/A N/A Post Procedural Pain: 0 N/A N/A Debridement Treatment Procedure was tolerated well N/A N/A Response: Post Debridement 1.5x0.8x0.1 N/A N/A Measurements L x W x D (cm) Post Debridement Volume: 0.094 N/A N/A (cm) Procedures Performed: Debridement N/A N/A Treatment Notes [name], [name] ([id number]) Electronic Signature(s) Signed: 11/01/2020 10:09:22 AM By: Kalman Shan DO Entered By: Kalman Shan on 11/01/2020 09:58:45 [place], Leighton Parody  ([id number]) -------------------------------------------------------------------------------- [place] Details Patient Name: [name], [name]. Date of Service: 11/01/2020 8:45 AM Medical Record Number: [id number] Patient Account Number: [id number] Date of Birth/Sex: 1945-10-07 (75 y.o. Female) Treating RN: Carlene Coria Primary Care Provider: Eliezer Lofts Other Clinician: Referring Provider: Owens Loffler Treating Provider/Extender: Yaakov Guthrie in Treatment: 0 Active Inactive Wound/Skin Impairment Nursing Diagnoses: Knowledge deficit related to ulceration/compromised skin integrity Goals: Patient/caregiver will verbalize understanding of skin care regimen Date Initiated: 11/01/2020 Target Resolution Date: 12/01/2020 Goal Status: Active Ulcer/skin breakdown will have a volume reduction of 30% by week 4 Date Initiated: 11/01/2020 Target Resolution Date: 01/01/2021 Goal Status: Active Ulcer/skin breakdown will have a volume reduction of 50% by week 8 Date Initiated: 11/01/2020 Target Resolution Date: 01/31/2021 Goal Status: Active Ulcer/skin breakdown will have a volume reduction of 80% by week 12 Date Initiated: 11/01/2020 Target Resolution Date: 03/03/2021 Goal Status: Active Ulcer/skin breakdown will heal within 14 weeks Date Initiated: 11/01/2020 Target Resolution Date: 04/02/2021 Goal Status: Active Interventions: Assess patient/caregiver ability to obtain necessary supplies Assess patient/caregiver ability to perform ulcer/skin care regimen upon admission and as needed Assess ulceration(s) every visit Notes: Electronic Signature(s) Signed: 11/01/2020 4:57:35 PM By: Carlene Coria RN Entered By: Carlene Coria on 11/01/2020 09:52:29 Mabus, Leighton Parody ([id number]) -------------------------------------------------------------------------------- Pain Assessment Details Patient Name: [name]. Date of Service: 11/01/2020 8:45 AM Medical Record  Number: [id number] Patient Account Number: [id number] Date of Birth/Sex: [birth year] (75 y.o. Female) Treating RN: Carlene Coria Primary Care Provider: Eliezer Lofts Other Clinician: Referring Provider: Owens Loffler Treating Provider/Extender: Yaakov Guthrie in Treatment: 0 Active Problems Location of Pain Severity and Description of Pain Patient Has Paino No Site Locations Pain Management and Medication Current Pain Management: Electronic Signature(s) Signed: 11/01/2020 4:57:35 PM By: Carlene Coria RN Entered By: Carlene Coria on 11/01/2020 09:02:56 Stinger, Leighton Parody ([id number]) -------------------------------------------------------------------------------- Patient/Caregiver Education Details Patient Name: [name]. Date of Service: 11/01/2020 8:45 AM Medical Record Number: [id number] Patient Account Number: [id number] Date of Birth/Gender: [birth year] (75 y.o. Female) Treating RN: Carlene Coria Primary Care Physician: Eliezer Lofts Other Clinician: Referring Physician: Owens Loffler Treating Physician/Extender: Yaakov Guthrie in Treatment: 0 Education Assessment Education Provided To: Patient Education Topics Provided Wound/Skin Impairment: Methods: Explain/Verbal Responses: State content correctly Electronic Signature(s) Signed: 11/01/2020 4:57:35 PM By: Carlene Coria RN Entered By: Carlene Coria on 11/01/2020 10:03:04 [place], Leighton Parody ([id number]) -------------------------------------------------------------------------------- Wound Assessment Details Patient Name: [name]. Date of Service: 11/01/2020 8:45 AM Medical Record Number: [id number] Patient Account Number: [id number] Date of Birth/Sex: 1945/08/22 (75 y.o. Female) Treating RN: Epps,  [place] Primary Care Kainen Struckman: Eliezer Lofts Other Clinician: Referring Grove Defina: Owens Loffler Treating Zahirah Cheslock/Extender: Yaakov Guthrie in Treatment: 0 Wound Status Wound Number: 1 Primary  Etiology: Diabetic Wound/Ulcer of the Lower Extremity Wound Location: Right, Distal, Medial Lower Leg Wound Status: Open Wounding Event: Trauma Comorbid History: Hypertension, Type II Diabetes Date Acquired: 10/02/2020 Weeks Of Treatment: 0 Clustered Wound: No Photos Wound Measurements Length: (cm) 1.5 Width: (cm) 0.8 Depth: (cm) 0.1 Area: (cm) 0.942 Volume: (cm) 0.094 % Reduction in Area: % Reduction in Volume: Epithelialization: None Tunneling: No Undermining: No Wound Description Classification: Grade 2 Exudate Amount: None Present Foul Odor After Cleansing: No Slough/Fibrino Yes Wound Bed Granulation Amount: None Present (0%) Exposed Structure Necrotic Amount: Large (67-100%) Fascia Exposed: No Necrotic Quality: Eschar, Adherent Slough Fat Layer (Subcutaneous Tissue) Exposed: Yes Tendon Exposed: No Muscle Exposed: No Joint Exposed: No Bone Exposed: No Treatment Notes Wound #1 (Lower Leg) Wound Laterality: Right, Medial, Distal Cleanser [place] Ancillary Kit - 15 Day Supply Discharge Instruction: Use supplies as instructed; Kit contains: (15) Saline Bullets; (15) 3x3 Gauze; 15 pr Gloves Normal Saline Discharge Instruction: Wash your hands with soap and water. Remove old dressing, discard into plastic bag and place into trash. Cleanse the wound with Normal Saline prior to applying a clean dressing using gauze sponges, not tissues or cotton balls. Do not Swickard, Loreta K. ([id number]) scrub or use excessive force. Pat dry using gauze sponges, not tissue or cotton balls. Soap and Water Discharge Instruction: Gently cleanse wound with antibacterial soap, rinse and pat dry prior to dressing wounds Peri-Wound Care Topical Primary Dressing Hydrofera Blue Ready Transfer Foam, 4x5 (in/in) Discharge Instruction: Apply Hydrofera Blue Ready to wound bed as directed Santyl Collagenase Ointment, 30 (gm), tube Secondary Dressing Zetuvit Plus Silicone Non-bordered 5x5  (in/in) Secured With Compression Wrap Compression Stockings Add-Ons Electronic Signature(s) Signed: 11/01/2020 4:57:35 PM By: Carlene Coria RN Entered By: Carlene Coria on 11/01/2020 09:19:35 Broyles, Leighton Parody ([id number]) -------------------------------------------------------------------------------- Vitals Details Patient Name: [name]. Date of Service: 11/01/2020 8:45 AM Medical Record Number: [id number] Patient Account Number: [id number] Date of Birth/Sex: 11-17-1945 (75 y.o. Female) Treating RN: Carlene Coria Primary Care Solveig Fangman: Eliezer Lofts Other Clinician: Referring Merriel Zinger: Owens Loffler Treating Eisley Barber/Extender: Yaakov Guthrie in Treatment: 0 Vital Signs Time Taken: 09:03 Temperature (F): 98.1 Height (in): 65 Pulse (bpm): 56 Source: Stated Respiratory Rate (breaths/min): 18 Weight (lbs): 167 Blood Pressure (mmHg): 122/69 Source: Stated Capillary Blood Glucose (mg/dl): 126 Body Mass Index (BMI): 27.8 Reference Range: 80 - 120 mg / dl Notes CBG per patient Electronic Signature(s) Signed: 11/01/2020 4:57:35 PM By: Carlene Coria RN Entered By: Carlene Coria on 11/01/2020 09:04:44

## 2020-11-07 DIAGNOSIS — N3946 Mixed incontinence: Secondary | ICD-10-CM | POA: Diagnosis not present

## 2020-11-08 ENCOUNTER — Other Ambulatory Visit: Payer: Self-pay

## 2020-11-08 ENCOUNTER — Encounter: Payer: Medicare HMO | Attending: Internal Medicine | Admitting: Internal Medicine

## 2020-11-08 DIAGNOSIS — I10 Essential (primary) hypertension: Secondary | ICD-10-CM | POA: Diagnosis not present

## 2020-11-08 DIAGNOSIS — E119 Type 2 diabetes mellitus without complications: Secondary | ICD-10-CM

## 2020-11-08 DIAGNOSIS — S81801D Unspecified open wound, right lower leg, subsequent encounter: Secondary | ICD-10-CM | POA: Diagnosis not present

## 2020-11-08 DIAGNOSIS — W228XXA Striking against or struck by other objects, initial encounter: Secondary | ICD-10-CM | POA: Insufficient documentation

## 2020-11-08 DIAGNOSIS — I4811 Longstanding persistent atrial fibrillation: Secondary | ICD-10-CM | POA: Diagnosis not present

## 2020-11-08 DIAGNOSIS — S81801A Unspecified open wound, right lower leg, initial encounter: Secondary | ICD-10-CM | POA: Diagnosis not present

## 2020-11-08 DIAGNOSIS — Z7901 Long term (current) use of anticoagulants: Secondary | ICD-10-CM | POA: Diagnosis not present

## 2020-11-08 DIAGNOSIS — Z7984 Long term (current) use of oral hypoglycemic drugs: Secondary | ICD-10-CM | POA: Diagnosis not present

## 2020-11-09 NOTE — Progress Notes (Signed)
Carelink Summary Report / Loop Recorder 

## 2020-11-14 ENCOUNTER — Other Ambulatory Visit: Payer: Self-pay | Admitting: Internal Medicine

## 2020-11-15 ENCOUNTER — Other Ambulatory Visit: Payer: Self-pay | Admitting: Family Medicine

## 2020-11-15 NOTE — Progress Notes (Signed)
[name], [name] ([id number]) Visit Report for 11/08/2020 Arrival Information Details Patient Name: [name], [name]. Date of Service: 11/08/2020 12:30 PM Medical Record Number: [id number] Patient Account Number: [id number] Date of Birth/Sex: 1945-03-29 (75 y.o. F) Treating RN: [name] Primary Care [name]: [name] Other Clinician: Referring Xaiden Fleig: [name] Treating [name]/Extender: [name] in Treatment: 1 Visit Information History Since Last Visit All ordered tests and consults were completed: No Patient Arrived: Ambulatory Added or deleted any medications: No Arrival Time: 12:36 Any new allergies or adverse reactions: No Accompanied By: self Had a fall or experienced change in No Transfer Assistance: None activities of daily living that may affect Patient Identification Verified: Yes risk of falls: Secondary Verification Process Completed: Yes Signs or symptoms of abuse/neglect since last visito No Patient Requires Transmission-Based Precautions: No Hospitalized since last visit: No Patient Has Alerts: No Implantable device outside of the clinic excluding No cellular tissue based products placed in the center since last visit: Has Dressing in Place as Prescribed: Yes Pain Present Now: No Electronic Signature(s) Signed: 11/15/2020 7:54:52 AM By: Emily Coria RN Entered By: [name] on 11/08/2020 12:39:06 Campanaro, [name] ([id number]) -------------------------------------------------------------------------------- Clinic Level of Care Assessment Details Patient Name: [name]. Date of Service: 11/08/2020 12:30 PM Medical Record Number: [id number] Patient Account Number: [id number] Date of Birth/Sex: 03-12-1945 (75 y.o. F) Treating RN: [name] Primary Care Novalie Leamy: [name] Other Clinician: Referring Pinchos Topel: [name] Treating [name]: [name] in Treatment: 1 Clinic Level of Care Assessment  Items TOOL 4 Quantity Score X - Use when only an EandM is performed on FOLLOW-UP visit 1 0 ASSESSMENTS - Nursing Assessment / Reassessment X - Reassessment of Co-morbidities (includes updates in patient status) 1 10 X- 1 5 Reassessment of Adherence to Treatment Plan ASSESSMENTS - Wound and Skin Assessment / Reassessment X - Simple Wound Assessment / Reassessment - one wound 1 5 _0  - 0 Complex Wound Assessment / Reassessment - multiple wounds _1  - 0 Dermatologic / Skin Assessment (not related to wound area) ASSESSMENTS - Focused Assessment _2  - Circumferential Edema Measurements - multi extremities 0 _3  - 0 Nutritional Assessment / Counseling / Intervention _4  - 0 Lower Extremity Assessment (monofilament, tuning fork, pulses) _5  - 0 Peripheral Arterial Disease Assessment (using hand held doppler) ASSESSMENTS - Ostomy and/or Continence Assessment and Care _6  - Incontinence Assessment and Management 0 _7  - 0 Ostomy Care Assessment and Management (repouching, etc.) PROCESS - Coordination of Care X - Simple Patient / Family Education for ongoing care 1 15 _8  - 0 Complex (extensive) Patient / Family Education for ongoing care _9  - 0 Staff obtains Programmer, systems, Records, Test Results / Process Orders _10  - 0 Staff telephones HHA, Nursing Homes / Clarify orders / etc _11  - 0 Routine Transfer to another Facility (non-emergent condition) _12  - 0 Routine Hospital Admission (non-emergent condition) _13  - 0 New Admissions / Biomedical engineer / Ordering NPWT, Apligraf, etc. _14  - 0 Emergency Hospital Admission (emergent condition) _15  - 0 Simple Discharge Coordination _16  - 0 Complex (extensive) Discharge Coordination PROCESS - Special Needs _17  - Pediatric / Minor Patient Management 0 _18  - 0 Isolation Patient Management _19  - 0 Hearing / Language / Visual special needs _20  - 0 Assessment of Community assistance (transportation, D/C planning, etc.) _21  - 0 Additional assistance /  Altered mentation _22  - 0 Support Surface(s) Assessment (bed, cushion, seat, etc.) INTERVENTIONS - Wound Cleansing / Measurement Wemhoff, Emily K. ([id number]) X- 1 5 Simple Wound Cleansing -  one wound _0  - 0 Complex Wound Cleansing - multiple wounds _1  - 0 Wound Imaging (photographs - any number of wounds) _2  - 0 Wound Tracing (instead of photographs) _3  - 0 Simple Wound Measurement - one wound _4  - 0 Complex Wound Measurement - multiple wounds INTERVENTIONS - Wound Dressings X - Small Wound Dressing one or multiple wounds 1 10 _5  - 0 Medium Wound Dressing one or multiple wounds _6  - 0 Large Wound Dressing one or multiple wounds <YQMVHQIONGEXBMWU>_1<\/LKGMWNUUVOZDGUYQ>_0  - 0 Application of Medications - topical <HKVQQVZDGLOVFIEP>_3<\/IRJJOACZYSAYTKZS>_0  - 0 Application of Medications - injection INTERVENTIONS - Miscellaneous _9  - External ear exam 0 _10  - 0 Specimen Collection (cultures, biopsies, blood, body fluids, etc.) _11  - 0 Specimen(s) / Culture(s) sent or taken to Lab for analysis _12  - 0 Patient Transfer (multiple staff / Civil Service fast streamer / Similar devices) _13  - 0 Simple Staple / Suture removal (25 or less) _14  - 0 Complex Staple / Suture removal (26 or more) _15  - 0 Hypo / Hyperglycemic Management (close monitor of Blood Glucose) _16  - 0 Ankle / Brachial Index (ABI) - do not check if billed separately X- 1 5 Vital Signs Has the patient been seen at the hospital within the last three years: Yes Total Score: 55 Level Of Care: New/Established - Level 2 Electronic Signature(s) Signed: 11/15/2020 7:54:52 AM By: Emily Coria RN Entered By: [name] on 11/08/2020 12:55:33 Armstead, [name] ([id number]) -------------------------------------------------------------------------------- Encounter Discharge Information Details Patient Name: [name]. Date of Service: 11/08/2020 12:30 PM Medical Record Number: [id number] Patient Account Number: [id number] Date of Birth/Sex: 08-Nov-1945 (75 y.o. F) Treating RN: [name] Primary Care Aura Bibby:  [name] Other Clinician: Referring [name]: [name] Treating Hyatt Capobianco/Extender: [name] in Treatment: 1 Encounter Discharge Information Items Discharge Condition: Stable Ambulatory Status: Ambulatory Discharge Destination: Home Transportation: Private Auto Accompanied By: self Schedule Follow-up Appointment: Yes Clinical Summary of Care: Patient Declined Electronic Signature(s) Signed: 11/15/2020 7:54:52 AM By: Emily Coria RN Entered By: [name] on 11/08/2020 12:56:33 Stinnette, [name] ([id number]) -------------------------------------------------------------------------------- Lower Extremity Assessment Details Patient Name: [name]. Date of Service: 11/08/2020 12:30 PM Medical Record Number: [id number] Patient Account Number: [id number] Date of Birth/Sex: 09-Jan-1946 (75 y.o. F) Treating RN: [name] Primary Care Marge Vandermeulen: [name] Other Clinician: Referring Michi Herrmann: [name] Treating Soley Harriss/Extender: [name] in Treatment: 1 Edema Assessment Assessed: [Left: No] [Right: No] [Left: Edema] [Right: :] Calf Left: Right: Point of Measurement: 34 cm From Medial Instep 30 cm Ankle Left: Right: Point of Measurement: 10 cm From Medial Instep 18 cm Vascular Assessment Pulses: Dorsalis Pedis Palpable: [Left:Yes] Electronic Signature(s) Signed: 11/15/2020 7:54:52 AM By: Emily Coria RN Entered By: [name] on 11/08/2020 12:54:33 Yazdi, [name] ([id number]) -------------------------------------------------------------------------------- Multi Wound Chart Details Patient Name: [name]. Date of Service: 11/08/2020 12:30 PM Medical Record Number: [id number] Patient Account Number: [id number] Date of Birth/Sex: 1945/05/11 (75 y.o. F) Treating RN: [name] Primary Care Sharline Lehane: [name] Other Clinician: Referring Katerine Morua: [name] Treating [name] Prisco/Extender: [name] in Treatment:  1 Vital Signs Height(in): 65 Capillary Blood Glucose 112 (mg/dl): Weight(lbs): 167 Pulse(bpm): 52 Body Mass Index(BMI): 28 Blood Pressure(mmHg): 112/53 Temperature(F): 98.4 Respiratory Rate(breaths/min): 18 Photos: [N/A:N/A] Wound Location: Right, Distal, Medial Lower Leg N/A N/A Wounding Event: Trauma N/A N/A Primary Etiology: Diabetic Wound/Ulcer of the Lower N/A N/A Extremity Comorbid History: Arrhythmia, Hypertension, Type II N/A N/A Diabetes Date Acquired: 10/02/2020 N/A N/A Weeks of Treatment: 1 N/A N/A Wound Status: Open N/A N/A Measurements  L x W x D (cm) 0.1x0.1x0.1 N/A N/A Area (cm) : 0.008 N/A N/A Volume (cm) : 0.001 N/A N/A % Reduction in Area: 99.20% N/A N/A % Reduction in Volume: 98.90% N/A N/A Classification: Grade 2 N/A N/A Exudate Amount: None Present N/A N/A Granulation Amount: None Present (0%) N/A N/A Necrotic Amount: Large (67-100%) N/A N/A Necrotic Tissue: Eschar N/A N/A Exposed Structures: Fascia: No N/A N/A Fat Layer (Subcutaneous Tissue): No Tendon: No Muscle: No Joint: No Bone: No Epithelialization: Large (67-100%) N/A N/A Treatment Notes Wound #1 (Lower Leg) Wound Laterality: Right, Medial, Distal Cleanser Byram Ancillary Kit - 15 Day Supply Discharge Instruction: Use supplies as instructed; Kit contains: (15) Saline Bullets; (15) 3x3 Gauze; 15 pr Gloves Normal Saline Discharge Instruction: Wash your hands with soap and water. Remove old dressing, discard into plastic bag and place into trash. Cleanse the wound with Normal Saline prior to applying a clean dressing using gauze sponges, not tissues or cotton balls. Do not scrub or use excessive force. Pat dry using gauze sponges, not tissue or cotton balls. Soap and [address] [place], [place] ([id number]) Discharge Instruction: Gently cleanse wound with antibacterial soap, rinse and pat dry prior to dressing wounds Peri-Wound Care Topical Primary Dressing Hydrofera Blue Ready Transfer Foam,  4x5 (in/in) Discharge Instruction: Apply Hydrofera Blue Ready to wound bed as directed Santyl Collagenase Ointment, 30 (gm), tube Secondary Dressing Zetuvit Plus Silicone Border Dressing 5x5 (in/in) Secured With Compression Wrap Compression Stockings Add-Ons Electronic Signature(s) Signed: 11/08/2020 1:19:10 PM By: Emily Shan DO Entered By: [name] on 11/08/2020 13:13:36 Mancha, [name] ([id number]) -------------------------------------------------------------------------------- [place] Details Patient Name: [name]. Date of Service: 11/08/2020 12:30 PM Medical Record Number: [id number] Patient Account Number: [id number] Date of Birth/Sex: 28-Apr-1945 (76 y.o. F) Treating RN: [name] Primary Care [name]: [name] Other Clinician: Referring [name]: [name] Treating Jame Seelig/Extender: [name] in Treatment: 1 Active Inactive Wound/Skin Impairment Nursing Diagnoses: Knowledge deficit related to ulceration/compromised skin integrity Goals: Patient/caregiver will verbalize understanding of skin care regimen Date Initiated: 11/01/2020 Target Resolution Date: 12/01/2020 Goal Status: Active Ulcer/skin breakdown will have a volume reduction of 30% by week 4 Date Initiated: 11/01/2020 Target Resolution Date: 01/01/2021 Goal Status: Active Ulcer/skin breakdown will have a volume reduction of 50% by week 8 Date Initiated: 11/01/2020 Target Resolution Date: 01/31/2021 Goal Status: Active Ulcer/skin breakdown will have a volume reduction of 80% by week 12 Date Initiated: 11/01/2020 Target Resolution Date: 03/03/2021 Goal Status: Active Ulcer/skin breakdown will heal within 14 weeks Date Initiated: 11/01/2020 Target Resolution Date: 04/02/2021 Goal Status: Active Interventions: Assess patient/caregiver ability to obtain necessary supplies Assess patient/caregiver ability to perform ulcer/skin care regimen upon admission  and as needed Assess ulceration(s) every visit Notes: Electronic Signature(s) Signed: 11/15/2020 7:54:52 AM By: Emily Coria RN Entered By: [name] on 11/08/2020 12:54:39 Moffatt, [name] ([id number]) -------------------------------------------------------------------------------- Pain Assessment Details Patient Name: [name]. Date of Service: 11/08/2020 12:30 PM Medical Record Number: [id number] Patient Account Number: [id number] Date of Birth/Sex: [birth year] (75 y.o. F) Treating RN: [name] Primary Care [name]: [name] Other Clinician: Referring Yasha Tibbett: [name] Treating Itha Kroeker/Extender: [name] in Treatment: 1 Active Problems Location of Pain Severity and Description of Pain Patient Has Paino No Site Locations Pain Management and Medication Current Pain Management: Electronic Signature(s) Signed: 11/15/2020 7:54:52 AM By: Emily Coria RN Entered By: [name] on 11/08/2020 12:40:36 Eakes, [name] ([id number]) -------------------------------------------------------------------------------- Patient/Caregiver Education Details Patient Name: [name]. Date of Service: 11/08/2020  12:30 PM Medical Record Number: [id number] Patient Account Number: [id number] Date of Birth/Gender: 11-18-1945 (75 y.o. F) Treating RN: [name] Primary Care Physician: [name] Other Clinician: Referring Physician: Eliezer King Treating Physician/Extender: [name] in Treatment: 1 Education Assessment Education Provided To: Patient Education Topics Provided Wound/Skin Impairment: Methods: Explain/Verbal Responses: State content correctly Electronic Signature(s) Signed: 11/15/2020 7:54:52 AM By: Emily Coria RN Entered By: [name] on 11/08/2020 12:56:00 Melin, [name] ([id number]) -------------------------------------------------------------------------------- Wound Assessment Details Patient Name: [name]. Date of Service: 11/08/2020 12:30 PM Medical Record Number: [id number] Patient Account Number: [id number] Date of Birth/Sex: 04-21-1945 (75 y.o. F) Treating RN: [name] Primary Care Yezenia Fredrick: [name] Other Clinician: Referring Farhan Jean: [name] Treating Sergei Delo/Extender: [name] in Treatment: 1 Wound Status Wound Number: 1 Primary Etiology: Diabetic Wound/Ulcer of the Lower Extremity Wound Location: Right, Distal, Medial Lower Leg Wound Status: Open Wounding Event: Trauma Comorbid History: Arrhythmia, Hypertension, Type II Diabetes Date Acquired: 10/02/2020 Weeks Of Treatment: 1 Clustered Wound: No Photos Wound Measurements Length: (cm) 0.1 Width: (cm) 0.1 Depth: (cm) 0.1 Area: (cm) 0.008 Volume: (cm) 0.001 % Reduction in Area: 99.2% % Reduction in Volume: 98.9% Epithelialization: Large (67-100%) Tunneling: No Undermining: No Wound Description Classification: Grade 2 Exudate Amount: None Present Foul Odor After Cleansing: No Slough/Fibrino Yes Wound Bed Granulation Amount: None Present (0%) Exposed Structure Necrotic Amount: Large (67-100%) Fascia Exposed: No Necrotic Quality: Eschar Fat Layer (Subcutaneous Tissue) Exposed: No Tendon Exposed: No Muscle Exposed: No Joint Exposed: No Bone Exposed: No Treatment Notes Wound #1 (Lower Leg) Wound Laterality: Right, Medial, Distal Cleanser Byram Ancillary Kit - 15 Day Supply Discharge Instruction: Use supplies as instructed; Kit contains: (15) Saline Bullets; (15) 3x3 Gauze; 15 pr Gloves Normal Saline Discharge Instruction: Wash your hands with soap and water. Remove old dressing, discard into plastic bag and place into trash. Cleanse the wound with Normal Saline prior to applying a clean dressing using gauze sponges, not tissues or cotton balls. Do not Fennewald, Syana K. ([id number]) scrub or use excessive force. Pat dry using gauze sponges, not tissue or cotton balls. Soap and  Water Discharge Instruction: Gently cleanse wound with antibacterial soap, rinse and pat dry prior to dressing wounds Peri-Wound Care Topical Primary Dressing Hydrofera Blue Ready Transfer Foam, 4x5 (in/in) Discharge Instruction: Apply Hydrofera Blue Ready to wound bed as directed Santyl Collagenase Ointment, 30 (gm), tube Secondary Dressing Zetuvit Plus Silicone Border Dressing 5x5 (in/in) Secured With Compression Wrap Compression Stockings Add-Ons Electronic Signature(s) Signed: 11/15/2020 7:54:52 AM By: Emily Coria RN Entered By: [name] on 11/08/2020 12:53:41 [place]rough, [name] ([id number]) -------------------------------------------------------------------------------- Vitals Details Patient Name: [name]. Date of Service: 11/08/2020 12:30 PM Medical Record Number: [id number] Patient Account Number: [id number] Date of Birth/Sex: 1945-03-28 (75 y.o. F) Treating RN: [name] Primary Care Yolanda Dockendorf: [name] Other Clinician: Referring Redding Cloe: [name] Treating Camrin Gearheart/Extender: [name] in Treatment: 1 Vital Signs Time Taken: 12:39 Temperature (F): 98.4 Height (in): 65 Pulse (bpm): 56 Weight (lbs): 167 Respiratory Rate (breaths/min): 18 Body Mass Index (BMI): 27.8 Blood Pressure (mmHg): 112/53 Capillary Blood Glucose (mg/dl): 112 Reference Range: 80 - 120 mg / dl Notes CBG per patient Electronic Signature(s) Signed: 11/15/2020 7:54:52 AM By: Emily Coria RN Entered By: [name] on 11/08/2020 12:40:30

## 2020-11-15 NOTE — Progress Notes (Signed)
HOLLIDAY, SHEAFFER ([id number]) Visit Report for 11/08/2020 Chief Complaint Document Details Patient Name: [name], [name]. Date of Service: 11/08/2020 12:30 PM Medical Record Number: [id number] Patient Account Number: [id number] Date of Birth/Sex: 16-Nov-1945 (75 y.o. F) Treating RN: Carlene Coria Primary Care Provider: Eliezer Lofts Other Clinician: Referring Provider: Eliezer Lofts Treating Provider/Extender: Yaakov Guthrie in Treatment: 1 Information Obtained from: Patient Chief Complaint Right lower extremity wound Electronic Signature(s) Signed: 11/08/2020 1:19:10 PM By: Kalman Shan DO Entered By: Kalman Shan on 11/08/2020 13:13:44 [place], Leighton Parody ([id number]) -------------------------------------------------------------------------------- HPI Details Patient Name: [name]. Date of Service: 11/08/2020 12:30 PM Medical Record Number: [id number] Patient Account Number: [id number] Date of Birth/Sex: [birth year] (75 y.o. F) Treating RN: Carlene Coria Primary Care Provider: Eliezer Lofts Other Clinician: Referring Provider: Eliezer Lofts Treating Provider/Extender: Yaakov Guthrie in Treatment: 1 History of Present Illness HPI Description: Admission 8/31 Ms. Deshana Rominger is a 75 year old female with a past medical history of controlled type 2 diabetes on oral medication, persistent atrial fibrillation on Eliquis that presents to the clinic for a right lower extremity wound. The wound occurred on 8/14 when she was pressure washing and hit her leg. She visited the ED and was treated for cellulitis with doxycycline. After 2 doses she visited her family medicine office and they gave her an injection of Rocephin since symptoms remained. She had close follow up again and they started her Levaquin for persistent cellulitis. She is not using any wound care dressings currently. She originally used hydrogen peroxide to the wound. She now reports improvement in redness to  the surrounding wound with Levaquin. She also reports improvement in wound healing over the past week. She currently denies signs of infection. 9/7; patient presents for 1 week follow-up. She has been using Hydrofera Blue and santyl daily with dressing changes. She has no issues or complaints today. She denies signs of infection. Electronic Signature(s) Signed: 11/08/2020 1:19:10 PM By: Kalman Shan DO Entered By: Kalman Shan on 11/08/2020 13:17:17 [place], Leighton Parody ([id number]) -------------------------------------------------------------------------------- Physical Exam Details Patient Name: [name]. Date of Service: 11/08/2020 12:30 PM Medical Record Number: [id number] Patient Account Number: [id number] Date of Birth/Sex: 05/12/1945 (75 y.o. F) Treating RN: Carlene Coria Primary Care Provider: Eliezer Lofts Other Clinician: Referring Provider: Eliezer Lofts Treating Provider/Extender: Yaakov Guthrie in Treatment: 1 Constitutional . Cardiovascular . Psychiatric . Notes Right lower extremity: To the lower distal medial aspect there is a wound with a scab present. No signs of infection. Appears well-healing. Electronic Signature(s) Signed: 11/08/2020 1:19:10 PM By: Kalman Shan DO Entered By: Kalman Shan on 11/08/2020 13:15:30 [place], Leighton Parody ([id number]) -------------------------------------------------------------------------------- Physician Orders Details Patient Name: [name]. Date of Service: 11/08/2020 12:30 PM Medical Record Number: [id number] Patient Account Number: [id number] Date of Birth/Sex: 1945-09-07 (75 y.o. F) Treating RN: Carlene Coria Primary Care Provider: Eliezer Lofts Other Clinician: Referring Provider: Eliezer Lofts Treating Provider/Extender: Yaakov Guthrie in Treatment: 1 Verbal / Phone Orders: No Diagnosis Coding Follow-up Appointments o Return Appointment in 1 week. Bathing/ Shower/ Hygiene o May shower;  gently cleanse wound with antibacterial soap, rinse and pat dry prior to dressing wounds Edema Control - Lymphedema / Segmental Compressive Device / Other o Elevate, Exercise Daily and Avoid Standing for Long Periods of Time. o Elevate legs to the level of the heart and pump ankles as often as possible o Elevate leg(s) parallel to the floor when sitting. Wound Treatment Wound #1 - Lower Leg Wound Laterality: Right, Medial, Distal Cleanser: Byram  Ancillary Kit - 15 Day Supply (Generic) 1 x Per Day/30 Days Discharge Instructions: Use supplies as instructed; Kit contains: (15) Saline Bullets; (15) 3x3 Gauze; 15 pr Gloves Cleanser: Normal Saline (Generic) 1 x Per Day/30 Days Discharge Instructions: Wash your hands with soap and water. Remove old dressing, discard into plastic bag and place into trash. Cleanse the wound with Normal Saline prior to applying a clean dressing using gauze sponges, not tissues or cotton balls. Do not scrub or use excessive force. Pat dry using gauze sponges, not tissue or cotton balls. Cleanser: Soap and Water 1 x Per Day/30 Days Discharge Instructions: Gently cleanse wound with antibacterial soap, rinse and pat dry prior to dressing wounds Primary Dressing: Hydrofera Blue Ready Transfer Foam, 4x5 (in/in) 1 x Per Day/30 Days Discharge Instructions: Apply Hydrofera Blue Ready to wound bed as directed Primary Dressing: Santyl Collagenase Ointment, 30 (gm), tube 1 x Per Day/30 Days Secondary Dressing: Zetuvit Plus Silicone Border Dressing 5x5 (in/in) (Generic) 1 x Per Day/30 Days Electronic Signature(s) Signed: 11/08/2020 1:19:10 PM By: Kalman Shan DO Signed: 11/15/2020 7:54:52 AM By: Carlene Coria RN Entered By: Carlene Coria on 11/08/2020 12:55:09 [place], Leighton Parody ([id number]) -------------------------------------------------------------------------------- Problem List Details Patient Name: [name]. Date of Service: 11/08/2020 12:30 PM Medical Record  Number: [id number] Patient Account Number: [id number] Date of Birth/Sex: 01-02-1946 (75 y.o. F) Treating RN: Carlene Coria Primary Care Provider: Eliezer Lofts Other Clinician: Referring Provider: Eliezer Lofts Treating Provider/Extender: Yaakov Guthrie in Treatment: 1 Active Problems ICD-10 Encounter Code Description Active Date MDM Diagnosis S81.801D Unspecified open wound, right lower leg, subsequent encounter 11/08/2020 No Yes E11.9 Type 2 diabetes mellitus without complications 6/56/8127 No Yes I48.11 Longstanding persistent atrial fibrillation 11/01/2020 No Yes Z79.01 Long term (current) use of anticoagulants 11/01/2020 No Yes Inactive Problems ICD-10 Code Description Active Date Inactive Date S81.801A Unspecified open wound, right lower leg, initial encounter 11/01/2020 11/01/2020 Resolved Problems Electronic Signature(s) Signed: 11/08/2020 1:19:10 PM By: Kalman Shan DO Entered By: Kalman Shan on 11/08/2020 13:12:55 [place], Leighton Parody ([id number]) -------------------------------------------------------------------------------- Progress Note Details Patient Name: [name]. Date of Service: 11/08/2020 12:30 PM Medical Record Number: [id number] Patient Account Number: [id number] Date of Birth/Sex: 15-Oct-1945 (75 y.o. F) Treating RN: Carlene Coria Primary Care Provider: Eliezer Lofts Other Clinician: Referring Provider: Eliezer Lofts Treating Provider/Extender: Yaakov Guthrie in Treatment: 1 Subjective Chief Complaint Information obtained from Patient Right lower extremity wound History of Present Illness (HPI) Admission 8/31 Ms. Janiyah Beery is a 75 year old female with a past medical history of controlled type 2 diabetes on oral medication, persistent atrial fibrillation on Eliquis that presents to the clinic for a right lower extremity wound. The wound occurred on 8/14 when she was pressure washing and hit her leg. She visited the ED and was treated for  cellulitis with doxycycline. After 2 doses she visited her family medicine office and they gave her an injection of Rocephin since symptoms remained. She had close follow up again and they started her Levaquin for persistent cellulitis. She is not using any wound care dressings currently. She originally used hydrogen peroxide to the wound. She now reports improvement in redness to the surrounding wound with Levaquin. She also reports improvement in wound healing over the past week. She currently denies signs of infection. 9/7; patient presents for 1 week follow-up. She has been using Hydrofera Blue and santyl daily with dressing changes. She has no issues or complaints today. She denies signs of infection. Patient History Information obtained from Patient. Social  History Never smoker, Marital Status - Married, Alcohol Use - Never, Drug Use - No History, Caffeine Use - Daily. Medical History Cardiovascular Patient has history of Arrhythmia - afib, Hypertension Endocrine Patient has history of Type II Diabetes Objective Constitutional Vitals Time Taken: 12:39 PM, Height: 65 in, Weight: 167 lbs, BMI: 27.8, Temperature: 98.4 F, Pulse: 56 bpm, Respiratory Rate: 18 breaths/min, Blood Pressure: 112/53 mmHg, Capillary Blood Glucose: 112 mg/dl. General Notes: CBG per patient General Notes: Right lower extremity: To the lower distal medial aspect there is a wound with a scab present. No signs of infection. Appears well- healing. Integumentary (Hair, Skin) Wound #1 status is Open. Original cause of wound was Trauma. The date acquired was: 10/02/2020. The wound has been in treatment 1 weeks. The wound is located on the Right,Distal,Medial Lower Leg. The wound measures 0.1cm length x 0.1cm width x 0.1cm depth; 0.008cm^2 area and 0.001cm^3 volume. There is no tunneling or undermining noted. There is a none present amount of drainage noted. There is no granulation within the wound bed. There is a large  (67-100%) amount of necrotic tissue within the wound bed including Eschar. Assessment MASAYE, GATCHALIAN ([id number]) Active Problems ICD-10 Unspecified open wound, right lower leg, subsequent encounter Type 2 diabetes mellitus without complications Longstanding persistent atrial fibrillation Long term (current) use of anticoagulants Patient's wound has shown improvement in size and appearance since last clinic visit. I recommended continuing Hydrofera Blue for 1 more week and then she would likely benefit from lotion daily. Patient would like to follow-up in 2 weeks. Plan Follow-up Appointments: Return Appointment in 1 week. Bathing/ Shower/ Hygiene: May shower; gently cleanse wound with antibacterial soap, rinse and pat dry prior to dressing wounds Edema Control - Lymphedema / Segmental Compressive Device / Other: Elevate, Exercise Daily and Avoid Standing for Long Periods of Time. Elevate legs to the level of the heart and pump ankles as often as possible Elevate leg(s) parallel to the floor when sitting. WOUND #1: - Lower Leg Wound Laterality: Right, Medial, Distal Cleanser: Byram Ancillary Kit - 15 Day Supply (Generic) 1 x Per Day/30 Days Discharge Instructions: Use supplies as instructed; Kit contains: (15) Saline Bullets; (15) 3x3 Gauze; 15 pr Gloves Cleanser: Normal Saline (Generic) 1 x Per Day/30 Days Discharge Instructions: Wash your hands with soap and water. Remove old dressing, discard into plastic bag and place into trash. Cleanse the wound with Normal Saline prior to applying a clean dressing using gauze sponges, not tissues or cotton balls. Do not scrub or use excessive force. Pat dry using gauze sponges, not tissue or cotton balls. Cleanser: Soap and Water 1 x Per Day/30 Days Discharge Instructions: Gently cleanse wound with antibacterial soap, rinse and pat dry prior to dressing wounds Primary Dressing: Hydrofera Blue Ready Transfer Foam, 4x5 (in/in) 1 x Per Day/30  Days Discharge Instructions: Apply Hydrofera Blue Ready to wound bed as directed Primary Dressing: Santyl Collagenase Ointment, 30 (gm), tube 1 x Per Day/30 Days Secondary Dressing: Zetuvit Plus Silicone Border Dressing 5x5 (in/in) (Generic) 1 x Per Day/30 Days 1. Hydrofera Blue 2. Follow-up in 2 weeks Electronic Signature(s) Signed: 11/08/2020 1:19:10 PM By: Kalman Shan DO Entered By: Kalman Shan on 11/08/2020 13:18:25 [place], Leighton Parody ([id number]) -------------------------------------------------------------------------------- ROS/PFSH Details Patient Name: [name]. Date of Service: 11/08/2020 12:30 PM Medical Record Number: [id number] Patient Account Number: [id number] Date of Birth/Sex: Dec 03, 1945 (75 y.o. F) Treating RN: Carlene Coria Primary Care Provider: Eliezer Lofts Other Clinician: Referring Provider: Eliezer Lofts Treating Provider/Extender: [place]  Loni Muse in Treatment: 1 Information Obtained From Patient Cardiovascular Medical History: Positive for: Arrhythmia - afib; Hypertension Endocrine Medical History: Positive for: Type II Diabetes Time with diabetes: 10 Treated with: Oral agents Blood sugar tested every day: Yes Tested : 2 Blood sugar testing results: Breakfast: 125; Dinner: 110 Immunizations Pneumococcal Vaccine: Received Pneumococcal Vaccination: Yes Received Pneumococcal Vaccination On or After 60th Birthday: Yes Implantable Devices None Family and Social History Never smoker; Marital Status - Married; Alcohol Use: Never; Drug Use: No History; Caffeine Use: Daily; Financial Concerns: No; Food, Clothing or Shelter Needs: No; Support System Lacking: No; Transportation Concerns: No Electronic Signature(s) Signed: 11/08/2020 1:19:10 PM By: Kalman Shan DO Signed: 11/15/2020 7:54:52 AM By: Carlene Coria RN Entered By: Kalman Shan on 11/08/2020 13:14:23 [place], Leighton Parody  ([id number]) -------------------------------------------------------------------------------- SuperBill Details Patient Name: [name]. Date of Service: 11/08/2020 Medical Record Number: [id number] Patient Account Number: [id number] Date of Birth/Sex: [birth year] (75 y.o. F) Treating RN: Carlene Coria Primary Care Provider: Eliezer Lofts Other Clinician: Referring Provider: Eliezer Lofts Treating Provider/Extender: Yaakov Guthrie in Treatment: 1 Diagnosis Coding ICD-10 Codes Code Description S81.801D Unspecified open wound, right lower leg, subsequent encounter E11.9 Type 2 diabetes mellitus without complications M30.14 Longstanding persistent atrial fibrillation Z79.01 Long term (current) use of anticoagulants Facility Procedures CPT4 Code: [id number] Description: [phone] - WOUND CARE VISIT-LEV 2 EST PT Modifier: Quantity: 1 Physician Procedures CPT4 Code: [id number] Description: 83507 - WC PHYS LEVEL 3 - EST PT Modifier: Quantity: 1 CPT4 Code: Description: ICD-10 Diagnosis Description S81.801D Unspecified open wound, right lower leg, subsequent encounter E11.9 Type 2 diabetes mellitus without complications D73.22 Longstanding persistent atrial fibrillation Z79.01 Long term (current) use of  anticoagulants Modifier: Quantity: Electronic Signature(s) Signed: 11/08/2020 1:19:10 PM By: Kalman Shan DO Entered By: Kalman Shan on 11/08/2020 13:18:52

## 2020-11-16 ENCOUNTER — Other Ambulatory Visit: Payer: Self-pay | Admitting: Cardiovascular Disease

## 2020-11-16 NOTE — Telephone Encounter (Signed)
Refill request

## 2020-11-20 ENCOUNTER — Other Ambulatory Visit: Payer: Self-pay

## 2020-11-20 ENCOUNTER — Encounter: Payer: Self-pay | Admitting: Internal Medicine

## 2020-11-20 ENCOUNTER — Ambulatory Visit (INDEPENDENT_AMBULATORY_CARE_PROVIDER_SITE_OTHER): Payer: Medicare HMO

## 2020-11-20 ENCOUNTER — Ambulatory Visit (INDEPENDENT_AMBULATORY_CARE_PROVIDER_SITE_OTHER): Payer: Medicare HMO | Admitting: Internal Medicine

## 2020-11-20 VITALS — BP 130/82 | HR 80 | Ht 65.0 in | Wt 165.2 lb

## 2020-11-20 DIAGNOSIS — E1159 Type 2 diabetes mellitus with other circulatory complications: Secondary | ICD-10-CM

## 2020-11-20 DIAGNOSIS — E063 Autoimmune thyroiditis: Secondary | ICD-10-CM

## 2020-11-20 DIAGNOSIS — E7800 Pure hypercholesterolemia, unspecified: Secondary | ICD-10-CM

## 2020-11-20 DIAGNOSIS — E119 Type 2 diabetes mellitus without complications: Secondary | ICD-10-CM

## 2020-11-20 DIAGNOSIS — I152 Hypertension secondary to endocrine disorders: Secondary | ICD-10-CM

## 2020-11-20 DIAGNOSIS — S81802A Unspecified open wound, left lower leg, initial encounter: Secondary | ICD-10-CM

## 2020-11-20 MED ORDER — RYBELSUS 7 MG PO TABS: 7.0000 mg | ORAL_TABLET | Freq: Every day | ORAL | 5 refills | Status: DC

## 2020-11-20 NOTE — Chronic Care Management (AMB) (Signed)
Chronic Care Management   CCM RN Visit Note  11/20/2020 Name: [name] MRN: [id number] DOB: Nov 21, 1945  Subjective: [name] is a 75 y.o. year old female who is a primary care patient of Bedsole, Amy E, MD. The care management team was consulted for assistance with disease management and care coordination needs.    Engaged with patient by telephone for follow up visit in response to provider referral for case management and/or care coordination services.   Consent to Services:  The patient was given information about Chronic Care Management services, agreed to services, and gave verbal consent prior to initiation of services.  Please see initial visit note for detailed documentation.   Patient agreed to services and verbal consent obtained.   Assessment: Review of patient past medical history, allergies, medications, health status, including review of consultants reports, laboratory and other test data, was performed as part of comprehensive evaluation and provision of chronic care management services.   SDOH (Social Determinants of Health) assessments and interventions performed:    CCM Care Plan  Allergies  Allergen Reactions   Cortisone Shortness Of Breath and Rash   Pravachol [Pravastatin Sodium]     Leg pain    Contrast Media [Iodinated Diagnostic Agents] Rash    Outpatient Encounter Medications as of 11/20/2020  Medication Sig   atorvastatin (LIPITOR) 10 MG tablet TAKE 1 TABLET BY MOUTH EVERYDAY AT BEDTIME   calcium carbonate (OS-CAL - DOSED IN MG OF ELEMENTAL CALCIUM) 1250 (500 Ca) MG tablet Take 1 tablet by mouth daily with breakfast.   doxycycline (VIBRA-TABS) 100 MG tablet Take 1 tablet (100 mg total) by mouth 2 (two) times daily.   ELIQUIS 5 MG TABS tablet TAKE 1 TABLET BY MOUTH TWICE A DAY   escitalopram (LEXAPRO) 10 MG tablet Take 1 tablet by mouth once daily   flecainide (TAMBOCOR) 100 MG tablet TAKE 1 TABLET BY MOUTH TWICE A DAY   hydroxypropyl  methylcellulose / hypromellose (ISOPTO TEARS / GONIOVISC) 2.5 % ophthalmic solution Place 1 drop into both eyes 2 (two) times daily as needed for dry eyes.   levofloxacin (LEVAQUIN) 500 MG tablet Take 1 tablet (500 mg total) by mouth daily.   metFORMIN (GLUCOPHAGE-XR) 500 MG 24 hr tablet TAKE 2 TABLETS (1,000 MG TOTAL) BY MOUTH 2 (TWO) TIMES DAILY WITH A MEAL.   metoprolol tartrate (LOPRESSOR) 25 MG tablet Take 0.5 tablets (12.5 mg total) by mouth 2 (two) times daily.   ONETOUCH ULTRA test strip CHECK BLOOD SUGAR 2 TIMES A DAY AS DIRECTED DX CODE E11.9   Semaglutide (RYBELSUS) 7 MG TABS Take 7 mg by mouth daily.   solifenacin (VESICARE) 10 MG tablet TAKE 1 TABLET BY MOUTH EVERY DAY   traZODone (DESYREL) 50 MG tablet TAKE 1 TABLET BY MOUTH EVERY DAY AT BEDTIME AS NEEDED FOR SLEEP   No facility-administered encounter medications on file as of 11/20/2020.    Patient Active Problem List   Diagnosis Date Noted   Cellulitis of left lower extremity 10/20/2020   Closed fracture of base of fifth metatarsal bone, left, initial encounter 07/26/2020   Ear pain, left 07/21/2019   Atrial tachycardia ([place]) 09/15/2018   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   Status post total hip replacement, 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   Lumbar radiculopathy 01/05/2018   Vitamin B 12 deficiency    Urge incontinence    OSA on CPAP    History of hiatal hernia    GERD (gastroesophageal reflux disease)    Anxiety  Other constipation 01/02/2017   Class 1 obesity with serious comorbidity and body mass index (BMI) of 30.0 to 30.9 in adult 01/02/2017   Overweight (BMI 25.0-29.9) 12/19/2016   Persistent atrial fibrillation ([place]) 12/17/2016   Vitamin D deficiency 11/20/2016   Family history of ovarian cancer 05/24/2016   Iron deficiency anemia    Hypertension associated with diabetes ([place])    Preoperative evaluation to rule out surgical contraindication 05/16/2015   Controlled diabetes mellitus type II without complication ([place]) 60/12/9321    Mitral regurgitation 10/11/2013   Incidental lung nodule 07/16/2011   SCIATICA, RIGHT 04/10/2010   Hyperlipidemia associated with type 2 diabetes mellitus ([place]) 06/11/2006   MDD (major depressive disorder), recurrent episode, mild ([place]) 06/11/2006   HEMORRHOIDS, INTERNAL 06/11/2006   DIVERTICULOSIS, COLON 06/11/2006   STRESS INCONTINENCE 06/11/2006    Conditions to be addressed/monitored:HTN and DMII  Care Plan : Diabetes Type 2 (Adult)  Updates made by Dannielle Karvonen, RN since 11/20/2020 12:00 AM     Problem: Glycemic Management (Diabetes, Type 2)   Priority: Medium     Long-Range Goal: Glycemic Management Optimized   Start Date: 09/08/2020  Expected End Date: 01/31/2021  Recent Progress: On track  Priority: Medium  Note:   Objective:  Lab Results  Component Value Date   HGBA1C 6.7 (H) 09/26/2020   Lab Results  Component Value Date   CREATININE 0.96 09/26/2020   CREATININE 0.99 05/25/2020   CREATININE 0.97 09/17/2019   Lab Results  Component Value Date   EGFR 60 05/25/2020  Patient states she saw her endocrinologist today. She states she was started on Rybelsus.  Patient states her blood sugars have ranged from 87- 183. She reports today's fasting blood sugar was 134.  Patient states she is monitoring her carbs/ diet and is starting to walk for exercise.   Patient states a couple of weeks ago she was out pressure washing her gazebo and the pressure washer hit her let leg causing a wound. She states she was put on antibiotics x 2 and sent to the wound care center.  Patient states she does dressing changes to the wound 2x per day with soap and water as advised and states she feels wound is improving. Next follow up visit at the wound center is 11/22/2020.    Patient reports having eye doctor visit.   Current Barriers:  Knowledge deficit related to long term plan of care for self management of Diabetes Type 2:  Patient states her major concern is keeping her diabetes under  control.   Case Manager Clinical Goal(s):  patient will demonstrate improved adherence to prescribed treatment plan for diabetes self care/management as evidenced by: daily monitoring and recording of CBG  adherence to ADA/ carb modified diet exercise 3 days/week adherence to prescribed medication regimen contacting provider for new or worsened symptoms or questions Interventions:  Collaboration with Jinny Sanders, MD regarding development and update of comprehensive plan of care as evidenced by provider attestation and co-signature Inter-disciplinary care team collaboration (see longitudinal plan of care) Reviewed medications with patient and discussed importance of medication adherence Discussed plans with patient for ongoing care management follow up and provided patient with direct contact information for care management team Reviewed scheduled/upcoming provider appointments  Advised patient to continue to monitor blood sugars daily and notify her doctor for findings outside established parameters.   Review of patient status, including review of consultants reports, relevant laboratory and other test results, and medications completed. Self-Care Activities  Self administers  medications as prescribed Attends  scheduled provider appointments Checks blood sugars as prescribed and utilize hyper and hypoglycemia protocol as needed Adheres to prescribed carb modified Patient Goals: - Continue to check blood sugar at prescribed times - check blood sugar if you feel it is too high or too low - take the blood sugar log or meter to your doctor visits -  Continue to check feet daily for cuts, sores or redness, keep toenails trimmed straight across, wear comfortable well fitting shoes.  - notify doctor for any changes in wound. Continue dressing changes as advised.  - follow diabetic diet     RULE OF 15 How to treat low blood sugars (Blood sugar less than 70 mg/dl  Please follow the RULE OF 15 for  the treatment of hypoglycemia treatment (When your blood sugars are less than 70 mg/ dl) STEP  1:  Take 15 grams of carbohydrates when your blood sugar is low, which includes:   3-4 glucose tabs or  3-4 oz of juice or regular soda or  One tube of glucose gel STEP 2:  Recheck blood sugar in 15 minutes STEP 3:  If your blood sugar is still low at the 15 minute recheck ---then, go back to STEP 1 and treat again with another 15 grams of carbohydrates Follow Up Plan: The patient has been provided with contact information for the care management team and has been advised to call with any health related questions or concerns.  The care management team will reach out to the patient again over the next 45 days.         Care Plan : Hypertension (Adult)  Updates made by Dannielle Karvonen, RN since 11/20/2020 12:00 AM     Problem: Disease Progression (Hypertension)   Priority: Medium     Long-Range Goal: Disease Progression Prevented or Minimized   Start Date: 09/08/2020  Expected End Date: 01/31/2021  This Visit's Progress: On track  Recent Progress: On track  Priority: Medium  Note:   Objective:  Last practice recorded BP readings:  BP Readings from Last 3 Encounters:  11/20/20 130/82  10/23/20 130/60  10/20/20 130/62   Patient reports blood pressure readings:  134/82, 130/60, 130/62.  Patient reports having follow up visit with cardiologist on 10/16/2020 and states medication was adjusted.  Patient reports having annual wellness visit with primary provider on 10/20/2020. Patient states she went to the urologist and is currently being treated for a bladder infection.  Patient reports marked improvement in lightheadedness/ dizziness symptoms.  Current Barriers:  Knowledge deficit related to long term plan of care for self management of hypertension.    Case Manager Clinical Goal(s):  patient will attend all scheduled medical appointments patient will demonstrate improved adherence to  prescribed treatment plan for hypertension as evidenced by taking all medications as prescribed, monitoring and recording blood pressure as directed, adhering to low sodium/DASH diet patient will verbalize basic understanding of hypertension disease process and self health management plan  Interventions:  Collaboration with Jinny Sanders, MD regarding development and update of comprehensive plan of care as evidenced by provider attestation and co-signature Inter-disciplinary care team collaboration (see longitudinal plan of care) Evaluation of current treatment plan related to hypertension self management and patient's adherence to plan as established by provider. Reviewed medications with patient and discussed importance of compliance Discussed plans with patient for ongoing care management follow up and provided patient with direct contact information for care management team Advised patient, providing education and  rationale, to continue to  monitor blood pressureand record, calling PCP for findings outside established parameters.  Reviewed scheduled/upcoming provider appointments Self-Care Activities: - Self administers medications as prescribed Attends all scheduled provider appointments Calls provider office for new concerns, questions, or BP outside discussed parameters Checks BP and records as discussed Follows a low sodium diet/DASH diet Patient Goals: - Continue to check blood pressure at least 3 times per week - continue to write blood pressure results in a log or diary - Exercise as tolerated at least 3 times per week - Continue to follow a low salt diet -Follow up with your providers as recommended.  -Continue to take your medications as prescribed.  Follow Up Plan: The patient has been provided with contact information for the care management team and has been advised to call with any health related questions or concerns.  The care management team will reach out to the patient  again over the next 45 days.           Plan:The patient has been provided with contact information for the care management team and has been advised to call with any health related questions or concerns.  and The care management team will reach out to the patient again over the next 45 days. Quinn Plowman RN,BSN,CCM RN Case Manager [place]  [phone]

## 2020-11-20 NOTE — Patient Instructions (Signed)
Visit Information:  Thank you for taking the time to speak with me today.  PATIENT GOALS:  Goals Addressed             This Visit's Progress    Monitor and Manage My Blood Sugar-Diabetes Type 2   On track    Timeframe:  Long-Range Goal Priority:  Medium Start Date:      09/08/2020                       Expected End Date:  01/31/2021                    Follow Up Date  12/22/2020    - Continue to check blood sugar at prescribed times - check blood sugar if you feel it is too high or too low - take the blood sugar log or meter to your doctor visits -  Continue to check feet daily for cuts, sores or redness, keep toenails trimmed straight across, wear comfortable well fitting shoes.  - notify doctor for any changes in wound. Continue dressing changes as advised.  - follow diabetic diet     RULE OF 15 How to treat low blood sugars (Blood sugar less than 70 mg/dl  Please follow the RULE OF 15 for the treatment of hypoglycemia treatment (When your blood sugars are less than 70 mg/ dl) STEP  1:  Take 15 grams of carbohydrates when your blood sugar is low, which includes:   3-4 glucose tabs or  3-4 oz of juice or regular soda or  One tube of glucose gel STEP 2:  Recheck blood sugar in 15 minutes STEP 3:  If your blood sugar is still low at the 15 minute recheck ---then, go back to STEP 1 and treat again with another 15 grams of carbohydrates  Why is this important?   Checking your blood sugar at home helps to keep it from getting very high or very low.  Writing the results in a diary or log helps the doctor know how to care for you.  Your blood sugar log should have the time, date and the results.  Also, write down the amount of insulin or other medicine that you take.  Other information, like what you ate, exercise done and how you were feeling, will also be helpful.          Track and Manage My Blood Pressure-Hypertension   On track    Timeframe:  Long-Range Goal Priority:   Medium Start Date:  09/08/2020                           Expected End Date:   01/31/2021                   Follow Up Date 12/22/2020   - Continue to check blood pressure at least 3 times per week - continue to write blood pressure results in a log or diary - Exercise as tolerated at least 3 times per week - Continue to follow a low salt diet -Follow up with your providers as recommended.  -Continue to take your medications as prescribed.  Why is this important?   You won't feel high blood pressure, but it can still hurt your blood vessels.  High blood pressure can cause heart or kidney problems. It can also cause a stroke.  Making lifestyle changes like losing a little weight or eating less  salt will help.  Checking your blood pressure at home and at different times of the day can help to control blood pressure.  If the doctor prescribes medicine remember to take it the way the doctor ordered.  Call the office if you cannot afford the medicine or if there are questions about it.             Patient verbalizes understanding of instructions provided today and agrees to view in [place].   The patient has been provided with contact information for the care management team and has been advised to call with any health related questions or concerns.  The care management team will reach out to the patient again over the next 45 days.   Quinn Plowman RN,BSN,CCM RN Case Manager [place]  [phone]

## 2020-11-20 NOTE — Patient Instructions (Signed)
Please continue: - Metformin ER 1000 mg 2x a day  Replace Tradjenta with: - Rybelsus 3.5 mg daily before b'fast x 1-2 weeks, then increase to 7 mg daily  Please return in 4 months with your sugar log.
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
## 2020-11-21 ENCOUNTER — Telehealth: Payer: Self-pay | Admitting: Cardiovascular Disease

## 2020-11-21 ENCOUNTER — Emergency Department (HOSPITAL_COMMUNITY)
Admission: EM | Admit: 2020-11-21 | Discharge: 2020-11-21 | Disposition: A | Discharge status: Home / Self Care | Payer: Medicare HMO | Attending: Emergency Medicine | Admitting: Emergency Medicine

## 2020-11-21 ENCOUNTER — Other Ambulatory Visit: Payer: Self-pay

## 2020-11-21 DIAGNOSIS — I4891 Unspecified atrial fibrillation: Secondary | ICD-10-CM | POA: Diagnosis not present

## 2020-11-21 DIAGNOSIS — E039 Hypothyroidism, unspecified: Secondary | ICD-10-CM | POA: Insufficient documentation

## 2020-11-21 DIAGNOSIS — Z79899 Other long term (current) drug therapy: Secondary | ICD-10-CM | POA: Insufficient documentation

## 2020-11-21 DIAGNOSIS — Z96641 Presence of right artificial hip joint: Secondary | ICD-10-CM | POA: Insufficient documentation

## 2020-11-21 DIAGNOSIS — Z7984 Long term (current) use of oral hypoglycemic drugs: Secondary | ICD-10-CM | POA: Diagnosis not present

## 2020-11-21 DIAGNOSIS — E785 Hyperlipidemia, unspecified: Secondary | ICD-10-CM | POA: Diagnosis not present

## 2020-11-21 DIAGNOSIS — E1169 Type 2 diabetes mellitus with other specified complication: Secondary | ICD-10-CM | POA: Diagnosis not present

## 2020-11-21 DIAGNOSIS — R519 Headache, unspecified: Secondary | ICD-10-CM | POA: Insufficient documentation

## 2020-11-21 DIAGNOSIS — R42 Dizziness and giddiness: Secondary | ICD-10-CM | POA: Diagnosis not present

## 2020-11-21 DIAGNOSIS — I10 Essential (primary) hypertension: Secondary | ICD-10-CM | POA: Diagnosis not present

## 2020-11-21 DIAGNOSIS — Z7901 Long term (current) use of anticoagulants: Secondary | ICD-10-CM | POA: Diagnosis not present

## 2020-11-21 LAB — BASIC METABOLIC PANEL
Anion gap: 12 (ref 5–15)
BUN: 15 mg/dL (ref 8–23)
CO2: 23 mmol/L (ref 22–32)
Calcium: 9.5 mg/dL (ref 8.9–10.3)
Chloride: 97 mmol/L — ABNORMAL LOW (ref 98–111)
Creatinine, Ser: 1.01 mg/dL — ABNORMAL HIGH (ref 0.44–1.00)
GFR, Estimated: 59 mL/min — ABNORMAL LOW (ref >60)
Glucose, Bld: 134 mg/dL — ABNORMAL HIGH (ref 70–99)
Potassium: 4.5 mmol/L (ref 3.5–5.1)
Sodium: 132 mmol/L — ABNORMAL LOW (ref 135–145)

## 2020-11-21 LAB — CBC
HCT: 37.9 % (ref 36.0–46.0)
Hemoglobin: 12 g/dL (ref 12.0–15.0)
MCH: 29.1 pg (ref 26.0–34.0)
MCHC: 31.7 g/dL (ref 30.0–36.0)
MCV: 91.8 fL (ref 80.0–100.0)
Platelets: 320 10*3/uL (ref 150–400)
RBC: 4.13 MIL/uL (ref 3.87–5.11)
RDW: 13 % (ref 11.5–15.5)
WBC: 6.7 10*3/uL (ref 4.0–10.5)
nRBC: 0 % (ref 0.0–0.2)

## 2020-11-21 LAB — CBG MONITORING, ED: Glucose-Capillary: 123 mg/dL — ABNORMAL HIGH (ref 70–99)

## 2020-11-21 LAB — MAGNESIUM: Magnesium: 1.7 mg/dL (ref 1.7–2.4)

## 2020-11-21 LAB — TSH: TSH: 2.684 u[IU]/mL (ref 0.350–4.500)

## 2020-11-21 MED ORDER — SODIUM CHLORIDE 0.9 % IV BOLUS
1000.0000 mL | Freq: Once | INTRAVENOUS | Status: AC
  Administered 2020-11-21: 1000 mL via INTRAVENOUS

## 2020-11-21 MED ORDER — PROPOFOL 10 MG/ML IV BOLUS
INTRAVENOUS | Status: AC | PRN
  Administered 2020-11-21: 6368.04 ug via INTRAVENOUS

## 2020-11-21 MED ORDER — PROPOFOL 10 MG/ML IV BOLUS
1.0000 mg/kg | Freq: Once | INTRAVENOUS | Status: AC
  Administered 2020-11-21: 79.8 mg via INTRAVENOUS
  Filled 2020-11-21: qty 20
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
## 2020-11-21 NOTE — Sedation Documentation (Signed)
50 prop in

## 2020-11-21 NOTE — Discharge Instructions (Signed)
Your history, exam, work-up today confirmed recurrent atrial fibrillation with RVR as the cause of your palpitations, lightheadedness, and symptoms.  After cardioversion, your symptoms improved and you maintained stability with sinus rhythm.  Based on our discussion with cardiology, they feel you are safe for discharge home.  Please follow-up with your cardiologist and rest and stay hydrated.  Please continue your home medications.  If any symptoms change or worsen, please turn to the nearest emergency department.
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
## 2020-11-21 NOTE — Telephone Encounter (Addendum)
Patient daughter called to AF clinic. Back in AF since about 10am - she took her morning medications and laid back down but her heart rate is running 167 BP 90/74 currently. Pt is weak and not feeling well. Discussed with [name] NP recommend proceeding to ER for further evaluation and probable cardioversion if no missed doses of Eliquis. Pt daughter states she will take to ER.

## 2020-11-21 NOTE — Telephone Encounter (Signed)
Patient c/o Palpitations:  High priority if patient c/o lightheadedness, shortness of breath, or chest pain  How long have you had palpitations/irregular HR/ Afib? Are you having the symptoms now? Since this morning - yes  Are you currently experiencing lightheadedness, SOB or CP? Lightheadedness   Do you have a history of afib (atrial fibrillation) or irregular heart rhythm? yes  Have you checked your BP or HR? (document readings if available):  69/55 pulse 169  Are you experiencing any other symptoms? no

## 2020-11-21 NOTE — Progress Notes (Signed)
RT note: Rt stand by with patient during cardioversion.

## 2020-11-21 NOTE — ED Provider Notes (Signed)
[facility] EMERGENCY DEPARTMENT Provider Note   CSN: [id number] Arrival date & time: 11/21/20  1435     History Chief Complaint  Patient presents with   Dizziness    [name] is a 75 y.o. female.  The history is provided by the patient, the spouse and medical records. No language interpreter was used.  Dizziness Quality:  Lightheadedness Severity:  Moderate Onset quality:  Gradual Duration:  1 day Timing:  Constant Progression:  Unchanged Chronicity:  Recurrent Context: standing up   Relieved by:  Nothing Worsened by:  Nothing Ineffective treatments:  None tried Associated symptoms: headaches and palpitations   Associated symptoms: no blood in stool, no chest pain, no diarrhea, no nausea, no shortness of breath, no syncope, no vision changes, no vomiting and no weakness       Past Medical History:  Diagnosis Date   Anemia    Anxiety    Atrial fibrillation (HCC)    Colon polyps    Constipation    Depressive disorder, not elsewhere classified    Diabetes mellitus without complication (HCC)    Diaphragmatic hernia without mention of obstruction or gangrene    Disorders of bursae and tendons in shoulder region, unspecified    Diverticulosis of colon (without mention of hemorrhage)    External hemorrhoid    Fundic gland polyps of stomach, benign    GERD (gastroesophageal reflux disease)    History of hiatal hernia    HTN (hypertension)    Hypertension    Hypothyroidism    Internal hemorrhoids without mention of complication    Moderate mitral regurgitation    Obesity, unspecified    OSA on CPAP    compliant with CPAP   Other urinary incontinence    Persistent atrial fibrillation ([place])    Pure hypercholesterolemia    Type II diabetes mellitus (HCC)    Urinary frequency    Urinary urgency    Vitamin B 12 deficiency     Patient Active Problem List   Diagnosis Date Noted   Cellulitis of left lower extremity 10/20/2020   Closed  fracture of base of fifth metatarsal bone, left, initial encounter 07/26/2020   Ear pain, left 07/21/2019   Atrial tachycardia ([place]) 09/15/2018   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   Status post total hip replacement, 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   Lumbar radiculopathy 01/05/2018   Vitamin B 12 deficiency    Urge incontinence    OSA on CPAP    History of hiatal hernia    GERD (gastroesophageal reflux disease)    Anxiety    Other constipation 01/02/2017   Class 1 obesity with serious comorbidity and body mass index (BMI) of 30.0 to 30.9 in adult 01/02/2017   Overweight (BMI 25.0-29.9) 12/19/2016   Persistent atrial fibrillation ([place]) 12/17/2016   Vitamin D deficiency 11/20/2016   Family history of ovarian cancer 05/24/2016   Iron deficiency anemia    Hypertension associated with diabetes ([place])    Preoperative evaluation to rule out surgical contraindication 05/16/2015   Controlled diabetes mellitus type II without complication ([place]) 28/41/3244   Mitral regurgitation 10/11/2013   Incidental lung nodule 07/16/2011   SCIATICA, RIGHT 04/10/2010   Hyperlipidemia associated with type 2 diabetes mellitus ([place]) 06/11/2006   MDD (major depressive disorder), recurrent episode, mild ([place]) 06/11/2006   HEMORRHOIDS, INTERNAL 06/11/2006   DIVERTICULOSIS, COLON 06/11/2006   STRESS INCONTINENCE 06/11/2006    Past Surgical History:  Procedure Laterality Date   ABDOMINAL HYSTERECTOMY     ABDOMINAL  HYSTERECTOMY  1972   Have both ovaries   ATRIAL FIBRILLATION ABLATION  12/17/2016   ATRIAL FIBRILLATION ABLATION N/A 12/17/2016   Procedure: ATRIAL FIBRILLATION ABLATION;  Surgeon: Thompson Grayer, MD;  Location: [facility];  Service: Cardiovascular;  Laterality: N/A;   [place]   [facility]; "Dr. [name]"   CARDIOVERSION N/A 05/27/2016   Procedure: CARDIOVERSION;  Surgeon: [name], MD;  Location: [facility];  Service: Cardiovascular;  Laterality: N/A;   CARDIOVERSION N/A 11/25/2016   Procedure:  CARDIOVERSION;  Surgeon: [name], MD;  Location: [facility];  Service: Cardiovascular;  Laterality: N/A;   CARDIOVERSION N/A 09/18/2018   Procedure: CARDIOVERSION;  Surgeon: [name], MD;  Location: [facility];  Service: Cardiovascular;  Laterality: N/A;   CARDIOVERSION N/A 07/28/2019   Procedure: CARDIOVERSION;  Surgeon: Kate Sable, MD;  Location: [facility];  Service: Cardiovascular;  Laterality: N/A;   CARDIOVERSION N/A 09/07/2019   Procedure: CARDIOVERSION;  Surgeon: Nelva Bush, MD;  Location: [facility];  Service: Cardiovascular;  Laterality: N/A;   CATARACT EXTRACTION W/ INTRAOCULAR LENS  IMPLANT, BILATERAL Bilateral    COLONOSCOPY     ELECTROPHYSIOLOGIC STUDY N/A 01/24/2016   Procedure: CARDIOVERSION;  Surgeon: [name], MD;  Location: [facility];  Service: Cardiovascular;  Laterality: N/A;   implantable loop recorder implantation  10/18/2019   [employer] Reveal [place] model [id number]) implantable loop recorder    LAPAROSCOPIC CHOLECYSTECTOMY     REDUCTION MAMMAPLASTY Bilateral    TONSILLECTOMY     TOTAL HIP ARTHROPLASTY Right 05/12/2018   Procedure: TOTAL HIP ARTHROPLASTY ANTERIOR APPROACH;  Surgeon: Paralee Cancel, MD;  Location: [facility];  Service: Orthopedics;  Laterality: Right;  70 mins     OB History     Gravida  2   Para  0   Term  0   Preterm  0   AB  0   Living         SAB  0   IAB  0   Ectopic  0   Multiple      Live Births              Family History  Problem Relation Age of Onset   Cancer Mother        fallopian tube   Dementia Mother    Pulmonary embolism Mother    Hyperlipidemia Mother    Diabetes Mother    Hyperlipidemia Father    Lung cancer Father    Hypertension Sister    Diabetes Brother    Cancer Brother    Stroke Paternal Grandmother    Stroke Paternal Grandfather    Colon cancer Neg Hx    Esophageal cancer Neg Hx     Social History   Tobacco Use   Smoking status: Never   Smokeless  tobacco: Never  Vaping Use   Vaping Use: Never used  Substance Use Topics   Alcohol use: No    Alcohol/week: 0.0 standard drinks   Drug use: No    Home Medications Prior to Admission medications   Medication Sig Start Date End Date Taking? Authorizing Provider  atorvastatin (LIPITOR) 10 MG tablet TAKE 1 TABLET BY MOUTH EVERYDAY AT BEDTIME 08/21/20   Bedsole, Amy E, MD  calcium carbonate (OS-CAL - DOSED IN MG OF ELEMENTAL CALCIUM) 1250 (500 Ca) MG tablet Take 1 tablet by mouth daily with breakfast.    [provider]  doxycycline (VIBRA-TABS) 100 MG tablet Take 1 tablet (100 mg total)  by mouth 2 (two) times daily. 10/23/20   Copland, Frederico Hamman, MD  ELIQUIS 5 MG TABS tablet TAKE 1 TABLET BY MOUTH TWICE A DAY 03/23/20   Allred, Jeneen Rinks, MD  escitalopram (LEXAPRO) 10 MG tablet Take 1 tablet by mouth once daily 10/17/20   Diona Browner, Amy E, MD  flecainide (TAMBOCOR) 100 MG tablet TAKE 1 TABLET BY MOUTH TWICE A DAY 08/21/20   Dunn, Areta Haber, PA-C  hydroxypropyl methylcellulose / hypromellose (ISOPTO TEARS / GONIOVISC) 2.5 % ophthalmic solution Place 1 drop into both eyes 2 (two) times daily as needed for dry eyes.    [provider]  levofloxacin (LEVAQUIN) 500 MG tablet Take 1 tablet (500 mg total) by mouth daily. 10/23/20   Copland, Frederico Hamman, MD  metFORMIN (GLUCOPHAGE-XR) 500 MG 24 hr tablet TAKE 2 TABLETS (1,000 MG TOTAL) BY MOUTH 2 (TWO) TIMES DAILY WITH A MEAL. 08/24/20   [name], MD  metoprolol tartrate (LOPRESSOR) 25 MG tablet Take 0.5 tablets (12.5 mg total) by mouth 2 (two) times daily. 11/16/20   Thompson Grayer, MD  ONETOUCH ULTRA test strip CHECK BLOOD SUGAR 2 TIMES A DAY AS DIRECTED DX CODE E11.9 08/24/20   [name], MD  Semaglutide (RYBELSUS) 7 MG TABS Take 7 mg by mouth daily. 11/20/20   [name], MD  solifenacin (VESICARE) 10 MG tablet TAKE 1 TABLET BY MOUTH EVERY DAY 11/15/20   Bedsole, Amy E, MD  traZODone (DESYREL) 50 MG tablet TAKE 1 TABLET BY MOUTH EVERY  DAY AT BEDTIME AS NEEDED FOR SLEEP 08/16/20   Chesley Mires, MD    Allergies    Cortisone, Pravachol [pravastatin sodium], and Contrast media [iodinated diagnostic agents]  Review of Systems   Review of Systems  Constitutional:  Negative for chills, diaphoresis, fatigue and fever.  HENT:  Negative for congestion.   Eyes:  Negative for photophobia and visual disturbance.  Respiratory:  Negative for cough, chest tightness, shortness of breath and wheezing.   Cardiovascular:  Positive for palpitations. Negative for chest pain, leg swelling and syncope.  Gastrointestinal:  Negative for abdominal pain, blood in stool, constipation, diarrhea, nausea and vomiting.  Genitourinary:  Negative for dysuria and flank pain.  Musculoskeletal:  Negative for back pain.  Skin:  Negative for rash and wound.  Neurological:  Positive for light-headedness and headaches. Negative for dizziness, syncope and weakness.  Psychiatric/Behavioral:  Negative for agitation and confusion.   All other systems reviewed and are negative.  Physical Exam Updated Vital Signs BP (!) 146/110 (BP Location: Right Arm)   Pulse (!) 134   Temp 98.7 F (37.1 C) (Oral)   Resp 18   Ht 5\' 5"  (1.651 m)   Wt 79.8 kg   LMP  (LMP Unknown)   SpO2 100%   BMI 29.29 kg/m   Physical Exam Vitals and nursing note reviewed.  Constitutional:      General: She is not in acute distress.    Appearance: She is well-developed. She is not ill-appearing, toxic-appearing or diaphoretic.  HENT:     Head: Normocephalic and atraumatic.     Nose: No congestion.     Mouth/Throat:     Mouth: Mucous membranes are moist.     Pharynx: No oropharyngeal exudate or posterior oropharyngeal erythema.  Eyes:     Conjunctiva/sclera: Conjunctivae normal.  Cardiovascular:     Rate and Rhythm: Tachycardia present. Rhythm irregular.     Pulses: Normal pulses.     Heart sounds: No murmur heard. Pulmonary:     Effort:  Pulmonary effort is normal. No  respiratory distress.     Breath sounds: Normal breath sounds. No wheezing, rhonchi or rales.  Chest:     Chest wall: No tenderness.  Abdominal:     Palpations: Abdomen is soft.     Tenderness: There is no abdominal tenderness. There is no guarding or rebound.  Musculoskeletal:        General: No tenderness.     Cervical back: Neck supple. No tenderness.     Right lower leg: No edema.     Left lower leg: No edema.  Skin:    General: Skin is warm and dry.     Findings: No erythema.  Neurological:     General: No focal deficit present.     Mental Status: She is alert.  Psychiatric:        Mood and Affect: Mood normal.    ED Results / Procedures / Treatments   Labs (all labs ordered are listed, but only abnormal results are displayed) Labs Reviewed  BASIC METABOLIC PANEL - Abnormal; Notable for the following components:      Result Value   Sodium 132 (*)    Chloride 97 (*)    Glucose, Bld 134 (*)    Creatinine, Ser 1.01 (*)    GFR, Estimated 59 (*)    All other components within normal limits  CBG MONITORING, ED - Abnormal; Notable for the following components:   Glucose-Capillary 123 (*)    All other components within normal limits  CBC  MAGNESIUM  TSH    EKG EKG Interpretation  Date/Time:  Tuesday November 21 2020 14:53:35 EDT Ventricular Rate:  137 PR Interval:    QRS Duration: 86 QT Interval:  302 QTC Calculation: 456 R Axis:   57 Text Interpretation: Atrial fibrillation with rapid ventricular response Nonspecific ST and T wave abnormality Abnormal ECG When compared to prior, now afib with RVR. No STEMI Confirmed by Antony Blackbird [phone]) on 11/21/2020 6:18:27 PM  Radiology No results found.  Procedures .Cardioversion  Date/Time: 11/21/2020 10:24 PM Performed by: Courtney Paris, MD Authorized by: Courtney Paris, MD   Consent:    Consent obtained:  Written   Consent given by:  Patient   Risks discussed:  Induced arrhythmia, cutaneous  burn and death   Alternatives discussed:  No treatment Pre-procedure details:    Cardioversion basis:  Emergent   Rhythm:  Atrial fibrillation   Electrode placement:  Anterior-posterior Patient sedated: Yes. Refer to sedation procedure documentation for details of sedation.  Attempt one:    Cardioversion mode:  Synchronous   Waveform:  Biphasic   Shock (Joules):  200   Shock outcome:  Conversion to normal sinus rhythm Post-procedure details:    Patient status:  Awake   Patient tolerance of procedure:  Tolerated well, no immediate complications .Sedation  Date/Time: 11/21/2020 10:25 PM Performed by: Courtney Paris, MD Authorized by: Courtney Paris, MD   Consent:    Consent obtained:  Written   Consent given by:  Patient   Risks discussed:  Allergic reaction, nausea, respiratory compromise necessitating ventilatory assistance and intubation, inadequate sedation, dysrhythmia, prolonged hypoxia resulting in organ damage, prolonged sedation necessitating reversal and vomiting Universal protocol:    Immediately prior to procedure, a time out was called: yes   Indications:    Procedure performed:  Cardioversion Pre-sedation assessment:    Time since last food or drink:  6 hours   ASA classification: class 3 - patient with severe systemic  disease     Mallampati score:  I - soft palate, uvula, fauces, pillars visible   Pre-sedation assessments completed and reviewed: pre-procedure airway patency not reviewed, pre-procedure cardiovascular function not reviewed, pre-procedure hydration status not reviewed, pre-procedure mental status not reviewed, pre-procedure nausea and vomiting status not reviewed, pre-procedure pain level not reviewed and pre-procedure respiratory function not reviewed   Immediate pre-procedure details:    Reassessment: Patient reassessed immediately prior to procedure     Reviewed: vital signs, relevant labs/tests and NPO status     Verified: bag valve  mask available, emergency equipment available, IV patency confirmed and oxygen available   Procedure details (see MAR for exact dosages):    Preoxygenation:  Nasal cannula   Sedation:  Propofol   Intended level of sedation: deep   Intra-procedure monitoring:  Blood pressure monitoring, continuous capnometry, frequent LOC assessments, cardiac monitor, continuous pulse oximetry and frequent vital sign checks   Intra-procedure events: none     Intra-procedure management:  Airway repositioning   Total Provider sedation time (minutes):  30 Post-procedure details:    Attendance: Constant attendance by certified staff until patient recovered     Recovery: Patient returned to pre-procedure baseline     Patient is stable for discharge or admission: yes     Procedure completion:  Tolerated well, no immediate complications   Medications Ordered in ED Medications  propofol (DIPRIVAN) 10 mg/mL bolus/IV push (6,368.04 mcg Intravenous Given 11/21/20 2116)  sodium chloride 0.9 % bolus 1,000 mL (0 mLs Intravenous Stopped 11/21/20 2036)  propofol (DIPRIVAN) 10 mg/mL bolus/IV push 79.8 mg (79.8 mg Intravenous Given 11/21/20 2116)    ED Course  I have reviewed the triage vital signs and the nursing notes.  Pertinent labs & imaging results that were available during my care of the patient were reviewed by me and considered in my medical decision making (see chart for details).    MDM Rules/Calculators/A&P                           TAMECIA MCDOUGALD is a 75 y.o. female with a past medical history significant for recurrent A. fib on Eliquis therapy status post multiple ED cardioversions and a previous ablation, diabetes, sleep apnea, hyperlipidemia, hypertension, and anxiety who presents with lightheadedness and palpitations.  Patient reports that she has been in a lot of stress recently with a sick family member and this morning woke up and was feeling more lightheaded and fatigued.  She reports she tried going  up some stairs and got more lightheaded like she might pass out.  She did not pass out but was feeling palpitations going fast and her watch told her heart rate was going in the 160s.  This feels similar to when she had A. fib with RVR in the past.  She reports she has been shocked multiple times and thinks that is what is going on now.  She reports she completed antibiotics for a UTI several weeks ago but denies any urinary symptoms.  She otherwise denies fevers, chills, ingestion, cough, nausea, vomiting, constipation, diarrhea, or urinary changes.  Denies any leg pain or leg swelling.  Aside from feeling lightheaded and having the palpitations today, she is feeling well.  She thinks the stress is what it is today.  Patient reports she had lunch and took her medications and lay down but after speaking with cardiology, she was instructed to come in when her symptoms did not improve.  On exam, patient does have an irregular and fast pulse on exam.  Lungs are clear and chest is nontender.  Slight murmur appreciated.  Good pulses in extremities.  Back and flanks nontender.  Abdomen nontender.  Patient otherwise resting comfortably and is not hypoxic.  Heart rate was in the 130s with what appeared to be A. fib with RVR on EKG.  No STEMI seen.  Clinically I do suspect she will need ED cardioversion once again as she reports she has been taking her Eliquis without missing any doses.  We will get screening labs to look for electrolyte imbalance or some other problem needing correction prior to cardioversion attempt however I anticipate we will call cardiology and this will be the plan.  It appears from chart review that she has tolerated propofol multiple times in the past for this will likely do it again.  We will give some fluids for her lightheadedness and the initial soft blood pressure documented when she spoke with cardiology.  Anticipate reassessment after labs are completed and discussion with  cardiology.  8:12 PM Work-up is returned overall reassuring.  Potassium is normal.  Creatinine not significant elevated.  CBC unremarkable and magnesium is within normal limits.  Will call cardiology but anticipate we will do a procedural sedation and ED cardioversion based on her history of similar presentation and management  Patient was cardioverted without difficulty.  Patient remained in sinus rhythm after cardioversion and did have occasional PVCs and bigeminy but remained in sinus rhythm.  Based on otherwise reassuring work-up and stability after cardioversion, we feel she is safe for discharge home and we are in agreement with cardiology.  Patient will call her cardiologist in follow-up and maintain her home medications.  She will maintain hydration as well.  She had otherwise or concerns and was discharged in good condition with resolution of symptoms.  Final Clinical Impression(s) / ED Diagnoses Final diagnoses:  Atrial fibrillation with RVR ([place])  Lightheadedness    Rx / DC Orders ED Discharge Orders     None      Clinical Impression: 1. Atrial fibrillation with RVR ([place])   2. Lightheadedness     Disposition: Discharge  Condition: Good  I have discussed the results, Dx and Tx plan with the pt(& family if present). He/she/they expressed understanding and agree(s) with the plan. Discharge instructions discussed at great length. Strict return precautions discussed and pt &/or family have verbalized understanding of the instructions. No further questions at time of discharge.    New Prescriptions   No medications on file    Follow Up: Jinny Sanders, MD [place] [address] [phone]     your cardiologist     [place] [address] [id number] mc [place]       Luismanuel Corman, Gwenyth Allegra, MD 11/21/20 2227

## 2020-11-21 NOTE — Sedation Documentation (Signed)
200 sync jules

## 2020-11-21 NOTE — ED Notes (Signed)
Pt drinking water 

## 2020-11-22 ENCOUNTER — Encounter (HOSPITAL_BASED_OUTPATIENT_CLINIC_OR_DEPARTMENT_OTHER): Payer: Medicare HMO | Admitting: Internal Medicine

## 2020-11-22 ENCOUNTER — Other Ambulatory Visit: Payer: Self-pay

## 2020-11-22 DIAGNOSIS — Z7901 Long term (current) use of anticoagulants: Secondary | ICD-10-CM | POA: Diagnosis not present

## 2020-11-22 DIAGNOSIS — S81801D Unspecified open wound, right lower leg, subsequent encounter: Secondary | ICD-10-CM | POA: Diagnosis not present

## 2020-11-22 DIAGNOSIS — Z7984 Long term (current) use of oral hypoglycemic drugs: Secondary | ICD-10-CM | POA: Diagnosis not present

## 2020-11-22 DIAGNOSIS — S81801A Unspecified open wound, right lower leg, initial encounter: Secondary | ICD-10-CM | POA: Diagnosis not present

## 2020-11-22 DIAGNOSIS — I4811 Longstanding persistent atrial fibrillation: Secondary | ICD-10-CM | POA: Diagnosis not present

## 2020-11-22 DIAGNOSIS — I10 Essential (primary) hypertension: Secondary | ICD-10-CM | POA: Diagnosis not present

## 2020-11-22 DIAGNOSIS — E119 Type 2 diabetes mellitus without complications: Secondary | ICD-10-CM

## 2020-11-22 DIAGNOSIS — W228XXA Striking against or struck by other objects, initial encounter: Secondary | ICD-10-CM | POA: Diagnosis not present

## 2020-11-23 NOTE — Progress Notes (Signed)
Emily, King ([id number]) Visit Report for 11/22/2020 Chief Complaint Document Details Patient Name: [name], [name]. Date of Service: 11/22/2020 1:15 PM Medical Record Number: [id number] Patient Account Number: [id number] Date of Birth/Sex: [birth year] (75 y.o. F) Treating RN: Carlene Coria Primary Care Provider: Eliezer Lofts Other Clinician: Referring Provider: Eliezer Lofts Treating Provider/Extender: Yaakov Guthrie in Treatment: 3 Information Obtained from: Patient Chief Complaint Right lower extremity wound Electronic Signature(s) Signed: 11/22/2020 1:54:54 PM By: Kalman Shan DO Entered By: Kalman Shan on 11/22/2020 13:50:30 [name], [name] ([id number]) -------------------------------------------------------------------------------- HPI Details Patient Name: [name]. Date of Service: 11/22/2020 1:15 PM Medical Record Number: [id number] Patient Account Number: [id number] Date of Birth/Sex: [birth year] (75 y.o. F) Treating RN: Carlene Coria Primary Care Provider: Eliezer Lofts Other Clinician: Referring Provider: Eliezer Lofts Treating Provider/Extender: Yaakov Guthrie in Treatment: 3 History of Present Illness HPI Description: Admission 8/31 [name] is a 75 year old female with a past medical history of controlled type 2 diabetes on oral medication, persistent atrial fibrillation on Eliquis that presents to the clinic for a right lower extremity wound. The wound occurred on 8/14 when she was pressure washing and hit her leg. She visited the ED and was treated for cellulitis with doxycycline. After 2 doses she visited her family medicine office and they gave her an injection of Rocephin since symptoms remained. She had close follow up again and they started her Levaquin for persistent cellulitis. She is not using any wound care dressings currently. She originally used hydrogen peroxide to the wound. She now reports improvement in redness to  the surrounding wound with Levaquin. She also reports improvement in wound healing over the past week. She currently denies signs of infection. 9/7; patient presents for 1 week follow-up. She has been using Hydrofera Blue and santyl daily with dressing changes. She has no issues or complaints today. She denies signs of infection. 9/21; patient presents for follow-up. She has been using Hydrofera Blue and santyl with dressing changes. She has no issues or complaints today. She reports that the wound has healed. Electronic Signature(s) Signed: 11/22/2020 1:54:54 PM By: Kalman Shan DO Entered By: Kalman Shan on 11/22/2020 13:52:13 [name], [name] ([id number]) -------------------------------------------------------------------------------- Physical Exam Details Patient Name: [name], [name]. Date of Service: 11/22/2020 1:15 PM Medical Record Number: [id number] Patient Account Number: [id number] Date of Birth/Sex: [birth year] (75 y.o. F) Treating RN: Carlene Coria Primary Care Provider: Eliezer Lofts Other Clinician: Referring Provider: Eliezer Lofts Treating Provider/Extender: Yaakov Guthrie in Treatment: 3 Constitutional . Cardiovascular . Psychiatric . Notes Right lower extremity: To the lower distal medial aspect there is epithelialization to previous wound site. No signs of infection. No surrounding skin breakdown Electronic Signature(s) Signed: 11/22/2020 1:54:54 PM By: Kalman Shan DO Entered By: Kalman Shan on 11/22/2020 13:52:51 [name], [name] ([id number]) -------------------------------------------------------------------------------- Physician Orders Details Patient Name: [name]. Date of Service: 11/22/2020 1:15 PM Medical Record Number: [id number] Patient Account Number: [id number] Date of Birth/Sex: [birth year] (75 y.o. F) Treating RN: Carlene Coria Primary Care Provider: Eliezer Lofts Other Clinician: Referring Provider: Eliezer Lofts Treating  Provider/Extender: Yaakov Guthrie in Treatment: 3 Verbal / Phone Orders: No Diagnosis Coding Discharge From [facility] Services o Discharge from [place] Treatment Complete - apply vasoline daily with bandaid times 1 week Wound Treatment Electronic Signature(s) Signed: 11/22/2020 1:54:54 PM By: Kalman Shan DO Signed: 11/23/2020 1:17:18 PM By: Carlene Coria RN Entered By: Carlene Coria on 11/22/2020 13:39:10 [name], [name] ([id number]) -------------------------------------------------------------------------------- Problem List Details Patient Name:  [name], Emily K. Date of Service: 11/22/2020 1:15 PM Medical Record Number: [id number] Patient Account Number: [id number] Date of Birth/Sex: 08/26/1945 (75 y.o. F) Treating RN: Carlene Coria Primary Care Provider: Eliezer Lofts Other Clinician: Referring Provider: Eliezer Lofts Treating Provider/Extender: Yaakov Guthrie in Treatment: 3 Active Problems ICD-10 Encounter Code Description Active Date MDM Diagnosis S81.801D Unspecified open wound, right lower leg, subsequent encounter 11/08/2020 No Yes E11.9 Type 2 diabetes mellitus without complications 2/94/7654 No Yes I48.11 Longstanding persistent atrial fibrillation 11/01/2020 No Yes Z79.01 Long term (current) use of anticoagulants 11/01/2020 No Yes Inactive Problems ICD-10 Code Description Active Date Inactive Date S81.801A Unspecified open wound, right lower leg, initial encounter 11/01/2020 11/01/2020 Resolved Problems Electronic Signature(s) Signed: 11/22/2020 1:54:54 PM By: Kalman Shan DO Entered By: Kalman Shan on 11/22/2020 13:50:12 [name], [name] ([id number]) -------------------------------------------------------------------------------- Progress Note Details Patient Name: [name]. Date of Service: 11/22/2020 1:15 PM Medical Record Number: [id number] Patient Account Number: [id number] Date of Birth/Sex: 26-Oct-1945 (76 y.o. F) Treating RN:  Carlene Coria Primary Care Provider: Eliezer Lofts Other Clinician: Referring Provider: Eliezer Lofts Treating Provider/Extender: Yaakov Guthrie in Treatment: 3 Subjective Chief Complaint Information obtained from Patient Right lower extremity wound History of Present Illness (HPI) Admission 8/31 Ms. [name] is a 75 year old female with a past medical history of controlled type 2 diabetes on oral medication, persistent atrial fibrillation on Eliquis that presents to the clinic for a right lower extremity wound. The wound occurred on 8/14 when she was pressure washing and hit her leg. She visited the ED and was treated for cellulitis with doxycycline. After 2 doses she visited her family medicine office and they gave her an injection of Rocephin since symptoms remained. She had close follow up again and they started her Levaquin for persistent cellulitis. She is not using any wound care dressings currently. She originally used hydrogen peroxide to the wound. She now reports improvement in redness to the surrounding wound with Levaquin. She also reports improvement in wound healing over the past week. She currently denies signs of infection. 9/7; patient presents for 1 week follow-up. She has been using Hydrofera Blue and santyl daily with dressing changes. She has no issues or complaints today. She denies signs of infection. 9/21; patient presents for follow-up. She has been using Hydrofera Blue and santyl with dressing changes. She has no issues or complaints today. She reports that the wound has healed. Patient History Information obtained from Patient. Social History Never smoker, Marital Status - Married, Alcohol Use - Never, Drug Use - No History, Caffeine Use - Daily. Medical History Cardiovascular Patient has history of Arrhythmia - afib, Hypertension Endocrine Patient has history of Type II Diabetes Objective Constitutional Vitals Time Taken: 1:23 PM, Height: 65 in,  Weight: 167 lbs, BMI: 27.8, Temperature: 98.6 F, Pulse: 76 bpm, Respiratory Rate: 18 breaths/min, Blood Pressure: 153/82 mmHg. General Notes: Right lower extremity: To the lower distal medial aspect there is epithelialization to previous wound site. No signs of infection. No surrounding skin breakdown Integumentary (Hair, Skin) Wound #1 status is Open. Original cause of wound was Trauma. The date acquired was: 10/02/2020. The wound has been in treatment 3 weeks. The wound is located on the Right,Distal,Medial Lower Leg. The wound measures 0cm length x 0cm width x 0cm depth; 0cm^2 area and 0cm^3 volume. There is no tunneling or undermining noted. There is a none present amount of drainage noted. There is no granulation within the wound bed. There is no necrotic tissue within the wound  bed. [name], [name] ([id number]) Assessment Active Problems ICD-10 Unspecified open wound, right lower leg, subsequent encounter Type 2 diabetes mellitus without complications Longstanding persistent atrial fibrillation Long term (current) use of anticoagulants Patient's wound has done well with Hydrofera Blue and Santyl. The wound is closed today. I recommended protecting this and using Vaseline for the next week. She can follow-up as needed. She knows to call with any questions or concerns. Plan Discharge From [facility] Services: Discharge from [place] Treatment Complete - apply vasoline daily with bandaid times 1 week 1. Keep area protected and put Vaseline daily 2. Follow-up as needed 3. Discharge from clinic due to closed wound Electronic Signature(s) Signed: 11/22/2020 1:54:54 PM By: Kalman Shan DO Entered By: Kalman Shan on 11/22/2020 13:54:17 [name], [name] ([id number]) -------------------------------------------------------------------------------- ROS/PFSH Details Patient Name: [name]. Date of Service: 11/22/2020 1:15 PM Medical Record Number: [id number] Patient Account  Number: [id number] Date of Birth/Sex: 1946/02/02 (74 y.o. F) Treating RN: Carlene Coria Primary Care Provider: Eliezer Lofts Other Clinician: Referring Provider: Eliezer Lofts Treating Provider/Extender: Yaakov Guthrie in Treatment: 3 Information Obtained From Patient Cardiovascular Medical History: Positive for: Arrhythmia - afib; Hypertension Endocrine Medical History: Positive for: Type II Diabetes Time with diabetes: 10 Treated with: Oral agents Blood sugar tested every day: Yes Tested : 2 Blood sugar testing results: Breakfast: 125; Dinner: 110 Immunizations Pneumococcal Vaccine: Received Pneumococcal Vaccination: Yes Received Pneumococcal Vaccination On or After 60th Birthday: Yes Implantable Devices None Family and Social History Never smoker; Marital Status - Married; Alcohol Use: Never; Drug Use: No History; Caffeine Use: Daily; Financial Concerns: No; Food, Clothing or Shelter Needs: No; Support System Lacking: No; Transportation Concerns: No Electronic Signature(s) Signed: 11/22/2020 1:54:54 PM By: Kalman Shan DO Signed: 11/23/2020 1:17:18 PM By: Carlene Coria RN Entered By: Kalman Shan on 11/22/2020 13:52:19 [place], [name] ([id number]) -------------------------------------------------------------------------------- SuperBill Details Patient Name: [name]. Date of Service: 11/22/2020 Medical Record Number: [id number] Patient Account Number: [id number] Date of Birth/Sex: [birth year] (75 y.o. F) Treating RN: Carlene Coria Primary Care Provider: Eliezer Lofts Other Clinician: Referring Provider: Eliezer Lofts Treating Provider/Extender: Yaakov Guthrie in Treatment: 3 Diagnosis Coding ICD-10 Codes Code Description S81.801D Unspecified open wound, right lower leg, subsequent encounter E11.9 Type 2 diabetes mellitus without complications Y63.78 Longstanding persistent atrial fibrillation Z79.01 Long term (current) use of  anticoagulants Facility Procedures CPT4 Code: [id number] Description: [id number] - WOUND CARE VISIT-LEV 2 EST PT Modifier: Quantity: 1 Physician Procedures CPT4 Code: [id number] Description: 94709 - WC PHYS LEVEL 3 - EST PT Modifier: Quantity: 1 CPT4 Code: Description: ICD-10 Diagnosis Description S81.801D Unspecified open wound, right lower leg, subsequent encounter E11.9 Type 2 diabetes mellitus without complications G28.36 Longstanding persistent atrial fibrillation Z79.01 Long term (current) use of  anticoagulants Modifier: Quantity: Electronic Signature(s) Signed: 11/22/2020 1:54:54 PM By: Kalman Shan DO Previous Signature: 11/22/2020 1:41:41 PM Version By: Carlene Coria RN Entered By: Kalman Shan on 11/22/2020 13:54:36

## 2020-11-23 NOTE — Progress Notes (Signed)
GWENDOLYNN, MERKEY ([id number]) Visit Report for 11/22/2020 Arrival Information Details Patient Name: [name], [name]. Date of Service: 11/22/2020 1:15 PM Medical Record Number: [id number] Patient Account Number: [id number] Date of Birth/Sex: [birth year] (75 y.o. F) Treating RN: Dolan Amen Primary Care Indianna Boran: Eliezer Lofts Other Clinician: Referring Dayshia Ballinas: Eliezer Lofts Treating Jayvin Hurrell/Extender: Yaakov Guthrie in Treatment: 3 Visit Information History Since Last Visit Pain Present Now: No Patient Arrived: Ambulatory Arrival Time: 13:22 Accompanied By: self Transfer Assistance: None Patient Identification Verified: Yes Secondary Verification Process Completed: Yes Patient Requires Transmission-Based Precautions: No Patient Has Alerts: No Electronic Signature(s) Signed: 11/22/2020 4:25:23 PM By: Dolan Amen RN Entered By: Dolan Amen on 11/22/2020 13:22:48 Billy, Leighton Parody ([id number]) -------------------------------------------------------------------------------- Clinic Level of Care Assessment Details Patient Name: [name]. Date of Service: 11/22/2020 1:15 PM Medical Record Number: [id number] Patient Account Number: [id number] Date of Birth/Sex: Jan 15, 1946 (75 y.o. F) Treating RN: Carlene Coria Primary Care Nalanie Winiecki: Eliezer Lofts Other Clinician: Referring Chayse Gracey: Eliezer Lofts Treating Zonnique Norkus/Extender: Yaakov Guthrie in Treatment: 3 Clinic Level of Care Assessment Items TOOL 4 Quantity Score X - Use when only an EandM is performed on FOLLOW-UP visit 1 0 ASSESSMENTS - Nursing Assessment / Reassessment X - Reassessment of Co-morbidities (includes updates in patient status) 1 10 X- 1 5 Reassessment of Adherence to Treatment Plan ASSESSMENTS - Wound and Skin Assessment / Reassessment X - Simple Wound Assessment / Reassessment - one wound 1 5 []  - 0 Complex Wound Assessment / Reassessment - multiple wounds []  - 0 Dermatologic / Skin  Assessment (not related to wound area) ASSESSMENTS - Focused Assessment []  - Circumferential Edema Measurements - multi extremities 0 []  - 0 Nutritional Assessment / Counseling / Intervention []  - 0 Lower Extremity Assessment (monofilament, tuning fork, pulses) []  - 0 Peripheral Arterial Disease Assessment (using hand held doppler) ASSESSMENTS - Ostomy and/or Continence Assessment and Care []  - Incontinence Assessment and Management 0 []  - 0 Ostomy Care Assessment and Management (repouching, etc.) PROCESS - Coordination of Care X - Simple Patient / Family Education for ongoing care 1 15 []  - 0 Complex (extensive) Patient / Family Education for ongoing care []  - 0 Staff obtains Programmer, systems, Records, Test Results / Process Orders []  - 0 Staff telephones HHA, Nursing Homes / Clarify orders / etc []  - 0 Routine Transfer to another Facility (non-emergent condition) []  - 0 Routine Hospital Admission (non-emergent condition) []  - 0 New Admissions / Biomedical engineer / Ordering NPWT, Apligraf, etc. []  - 0 Emergency Hospital Admission (emergent condition) X- 1 10 Simple Discharge Coordination []  - 0 Complex (extensive) Discharge Coordination PROCESS - Special Needs []  - Pediatric / Minor Patient Management 0 []  - 0 Isolation Patient Management []  - 0 Hearing / Language / Visual special needs []  - 0 Assessment of Community assistance (transportation, D/C planning, etc.) []  - 0 Additional assistance / Altered mentation []  - 0 Support Surface(s) Assessment (bed, cushion, seat, etc.) INTERVENTIONS - Wound Cleansing / Measurement Buchheit, Christiane K. ([id number]) X- 1 5 Simple Wound Cleansing - one wound []  - 0 Complex Wound Cleansing - multiple wounds X- 1 5 Wound Imaging (photographs - any number of wounds) []  - 0 Wound Tracing (instead of photographs) X- 1 5 Simple Wound Measurement - one wound []  - 0 Complex Wound Measurement - multiple wounds INTERVENTIONS - Wound  Dressings []  - Small Wound Dressing one or multiple wounds 0 []  - 0 Medium Wound Dressing one or multiple wounds []  - 0 Large Wound  Dressing one or multiple wounds []  - 0 Application of Medications - topical []  - 0 Application of Medications - injection INTERVENTIONS - Miscellaneous []  - External ear exam 0 []  - 0 Specimen Collection (cultures, biopsies, blood, body fluids, etc.) []  - 0 Specimen(s) / Culture(s) sent or taken to Lab for analysis []  - 0 Patient Transfer (multiple staff / Civil Service fast streamer / Similar devices) []  - 0 Simple Staple / Suture removal (25 or less) []  - 0 Complex Staple / Suture removal (26 or more) []  - 0 Hypo / Hyperglycemic Management (close monitor of Blood Glucose) []  - 0 Ankle / Brachial Index (ABI) - do not check if billed separately X- 1 5 Vital Signs Has the patient been seen at the hospital within the last three years: Yes Total Score: 65 Level Of Care: New/Established - Level 2 Electronic Signature(s) Signed: 11/23/2020 1:17:18 PM By: Carlene Coria RN Entered By: Carlene Coria on 11/22/2020 13:41:24 Forgette, Leighton Parody ([id number]) -------------------------------------------------------------------------------- Encounter Discharge Information Details Patient Name: [name]. Date of Service: 11/22/2020 1:15 PM Medical Record Number: [id number] Patient Account Number: [id number] Date of Birth/Sex: 04/23/1945 (75 y.o. F) Treating RN: Carlene Coria Primary Care Lawsyn Heiler: Eliezer Lofts Other Clinician: Referring Delanee Xin: Eliezer Lofts Treating Donnesha Karg/Extender: Yaakov Guthrie in Treatment: 3 Encounter Discharge Information Items Discharge Condition: Stable Ambulatory Status: Ambulatory Discharge Destination: Home Transportation: Private Auto Accompanied By: self Schedule Follow-up Appointment: Yes Clinical Summary of Care: Patient Declined Electronic Signature(s) Signed: 11/22/2020 1:43:04 PM By: Carlene Coria RN Entered By: Carlene Coria on 11/22/2020 13:43:04 Richer, Leighton Parody ([id number]) -------------------------------------------------------------------------------- Lower Extremity Assessment Details Patient Name: [name]. Date of Service: 11/22/2020 1:15 PM Medical Record Number: [id number] Patient Account Number: [id number] Date of Birth/Sex: 26-Dec-1945 (75 y.o. F) Treating RN: Dolan Amen Primary Care Vicent Febles: Eliezer Lofts Other Clinician: Referring Clotee Schlicker: Eliezer Lofts Treating Callin Ashe/Extender: Yaakov Guthrie in Treatment: 3 Edema Assessment Assessed: [Left: Yes] [Right: No] Edema: [Left: N] [Right: o] Calf Left: Right: Point of Measurement: 29 cm From Medial Instep 31.5 cm Ankle Left: Right: Point of Measurement: 10 cm From Medial Instep 19.5 cm Electronic Signature(s) Signed: 11/22/2020 4:25:23 PM By: Dolan Amen RN Entered By: Dolan Amen on 11/22/2020 13:28:16 Doucet, Leighton Parody ([id number]) -------------------------------------------------------------------------------- Multi Wound Chart Details Patient Name: [name]. Date of Service: 11/22/2020 1:15 PM Medical Record Number: [id number] Patient Account Number: [id number] Date of Birth/Sex: 08-28-1945 (75 y.o. F) Treating RN: Carlene Coria Primary Care Laurissa Cowper: Eliezer Lofts Other Clinician: Referring Latravion Graves: Eliezer Lofts Treating Shanquita Ronning/Extender: Yaakov Guthrie in Treatment: 3 Vital Signs Height(in): 65 Pulse(bpm): 65 Weight(lbs): 167 Blood Pressure(mmHg): 153/82 Body Mass Index(BMI): 28 Temperature(F): 98.6 Respiratory Rate(breaths/min): 18 Photos: [N/A:N/A] Wound Location: Right, Distal, Medial Lower Leg N/A N/A Wounding Event: Trauma N/A N/A Primary Etiology: Diabetic Wound/Ulcer of the Lower N/A N/A Extremity Comorbid History: Arrhythmia, Hypertension, Type II N/A N/A Diabetes Date Acquired: 10/02/2020 N/A N/A Weeks of Treatment: 3 N/A N/A Wound Status: Open N/A N/A Measurements L x  W x D (cm) 0x0x0 N/A N/A Area (cm) : 0 N/A N/A Volume (cm) : 0 N/A N/A % Reduction in Area: 100.00% N/A N/A % Reduction in Volume: 100.00% N/A N/A Classification: Grade 2 N/A N/A Exudate Amount: None Present N/A N/A Granulation Amount: None Present (0%) N/A N/A Necrotic Amount: None Present (0%) N/A N/A Exposed Structures: Fascia: No N/A N/A Fat Layer (Subcutaneous Tissue): No Tendon: No Muscle: No Joint: No Bone: No Epithelialization: Large (67-100%) N/A N/A Treatment Notes Wound #  1 (Lower Leg) Wound Laterality: Right, Medial, Distal Cleanser Peri-Wound Care Topical Primary Dressing Secondary Dressing Secured With DEMITRA, DANLEY ([id number]) Compression Wrap Compression Stockings Add-Ons Electronic Signature(s) Signed: 11/22/2020 1:54:54 PM By: Kalman Shan DO Previous Signature: 11/22/2020 1:42:17 PM Version By: Carlene Coria RN Entered By: Kalman Shan on 11/22/2020 13:50:21 [place], Leighton Parody ([id number]) -------------------------------------------------------------------------------- Golden Details Patient Name: NELSY, MADONNA. Date of Service: 11/22/2020 1:15 PM Medical Record Number: [id number] Patient Account Number: [id number] Date of Birth/Sex: Nov 08, 1945 (75 y.o. F) Treating RN: Carlene Coria Primary Care Leyah Bocchino: Eliezer Lofts Other Clinician: Referring Alle Difabio: Eliezer Lofts Treating Bobi Daudelin/Extender: Yaakov Guthrie in Treatment: 3 Active Inactive Electronic Signature(s) Signed: 11/22/2020 1:42:07 PM By: Carlene Coria RN Entered By: Carlene Coria on 11/22/2020 13:42:07 Wilkinson, Leighton Parody ([id number]) -------------------------------------------------------------------------------- Pain Assessment Details Patient Name: KRYSTINA, STRIETER. Date of Service: 11/22/2020 1:15 PM Medical Record Number: [id number] Patient Account Number: [id number] Date of Birth/Sex: 1945/09/20 (75 y.o. F) Treating RN: Dolan Amen Primary Care  Chancey Ringel: Eliezer Lofts Other Clinician: Referring Rorik Vespa: Eliezer Lofts Treating Rasheeda Mulvehill/Extender: Yaakov Guthrie in Treatment: 3 Active Problems Location of Pain Severity and Description of Pain Patient Has Paino No Site Locations Rate the pain. Current Pain Level: 0 Pain Management and Medication Current Pain Management: Electronic Signature(s) Signed: 11/22/2020 4:25:23 PM By: Dolan Amen RN Entered By: Dolan Amen on 11/22/2020 13:24:07 Pree, Leighton Parody ([id number]) -------------------------------------------------------------------------------- Patient/Caregiver Education Details Patient Name: [name]. Date of Service: 11/22/2020 1:15 PM Medical Record Number: [id number] Patient Account Number: [id number] Date of Birth/Gender: March 12, 1945 (75 y.o. F) Treating RN: Carlene Coria Primary Care Physician: Eliezer Lofts Other Clinician: Referring Physician: Eliezer Lofts Treating Physician/Extender: Yaakov Guthrie in Treatment: 3 Education Assessment Education Provided To: Patient Education Topics Provided Wound/Skin Impairment: Methods: Explain/Verbal Responses: State content correctly Electronic Signature(s) Signed: 11/23/2020 1:17:18 PM By: Carlene Coria RN Entered By: Carlene Coria on 11/22/2020 13:41:53 Fellner, Leighton Parody ([id number]) -------------------------------------------------------------------------------- Wound Assessment Details Patient Name: [name]. Date of Service: 11/22/2020 1:15 PM Medical Record Number: [id number] Patient Account Number: [id number] Date of Birth/Sex: 1945-11-08 (75 y.o. F) Treating RN: Dolan Amen Primary Care Vernette Moise: Eliezer Lofts Other Clinician: Referring Remmy Crass: Eliezer Lofts Treating Jetta Murray/Extender: Yaakov Guthrie in Treatment: 3 Wound Status Wound Number: 1 Primary Etiology: Diabetic Wound/Ulcer of the Lower Extremity Wound Location: Right, Distal, Medial Lower Leg Wound Status:  Open Wounding Event: Trauma Comorbid History: Arrhythmia, Hypertension, Type II Diabetes Date Acquired: 10/02/2020 Weeks Of Treatment: 3 Clustered Wound: No Photos Wound Measurements Length: (cm) 0 % R Width: (cm) 0 % R Depth: (cm) 0 Epi Area: (cm) 0 Tu Volume: (cm) 0 Un eduction in Area: 100% eduction in Volume: 100% thelialization: Large (67-100%) nneling: No dermining: No Wound Description Classification: Grade 2 Fou Exudate Amount: None Present Slo l Odor After Cleansing: No ugh/Fibrino No Wound Bed Granulation Amount: None Present (0%) Exposed Structure Necrotic Amount: None Present (0%) Fascia Exposed: No Fat Layer (Subcutaneous Tissue) Exposed: No Tendon Exposed: No Muscle Exposed: No Joint Exposed: No Bone Exposed: No Treatment Notes Wound #1 (Lower Leg) Wound Laterality: Right, Medial, Distal Cleanser Peri-Wound Care Topical Primary Dressing KAMBRYN, DAPOLITO ([id number]) Secondary Dressing Secured With Compression Wrap Compression Stockings Add-Ons Electronic Signature(s) Signed: 11/22/2020 4:25:23 PM By: Dolan Amen RN Entered By: Dolan Amen on 11/22/2020 13:26:36 Smolinski, Leighton Parody ([id number]) -------------------------------------------------------------------------------- Vitals Details Patient Name: [name]. Date of Service: 11/22/2020 1:15 PM Medical Record Number: [id number] Patient Account Number: [id number] Date of  Birth/Sex: 01/28/1946 (75 y.o. F) Treating RN: Dolan Amen Primary Care Eily Louvier: Eliezer Lofts Other Clinician: Referring Jennings Stirling: Eliezer Lofts Treating Kilan Banfill/Extender: Yaakov Guthrie in Treatment: 3 Vital Signs Time Taken: 13:23 Temperature (F): 98.6 Height (in): 65 Pulse (bpm): 76 Weight (lbs): 167 Respiratory Rate (breaths/min): 18 Body Mass Index (BMI): 27.8 Blood Pressure (mmHg): 153/82 Reference Range: 80 - 120 mg / dl Electronic Signature(s) Signed: 11/22/2020 4:25:23 PM By: Dolan Amen RN Entered By: Dolan Amen on 11/22/2020 13:23:48

## 2020-11-27 ENCOUNTER — Telehealth (HOSPITAL_COMMUNITY): Payer: Self-pay | Admitting: *Deleted

## 2020-11-27 ENCOUNTER — Ambulatory Visit (INDEPENDENT_AMBULATORY_CARE_PROVIDER_SITE_OTHER): Payer: Medicare HMO

## 2020-11-27 ENCOUNTER — Ambulatory Visit (HOSPITAL_COMMUNITY)
Admission: RE | Admit: 2020-11-27 | Discharge: 2020-11-27 | Disposition: A | Discharge status: Home / Self Care | Payer: Medicare HMO | Source: Ambulatory Visit | Attending: Nurse Practitioner | Admitting: Nurse Practitioner

## 2020-11-27 ENCOUNTER — Encounter (HOSPITAL_COMMUNITY): Payer: Self-pay | Admitting: Physician Assistant

## 2020-11-27 ENCOUNTER — Other Ambulatory Visit: Payer: Self-pay

## 2020-11-27 ENCOUNTER — Telehealth: Payer: Self-pay

## 2020-11-27 VITALS — BP 132/68 | HR 76 | Ht 65.0 in | Wt 167.0 lb

## 2020-11-27 DIAGNOSIS — Z6379 Other stressful life events affecting family and household: Secondary | ICD-10-CM | POA: Diagnosis not present

## 2020-11-27 DIAGNOSIS — R69 Illness, unspecified: Secondary | ICD-10-CM | POA: Diagnosis not present

## 2020-11-27 DIAGNOSIS — D6869 Other thrombophilia: Secondary | ICD-10-CM | POA: Diagnosis not present

## 2020-11-27 DIAGNOSIS — I340 Nonrheumatic mitral (valve) insufficiency: Secondary | ICD-10-CM | POA: Insufficient documentation

## 2020-11-27 DIAGNOSIS — Z888 Allergy status to other drugs, medicaments and biological substances status: Secondary | ICD-10-CM | POA: Diagnosis not present

## 2020-11-27 DIAGNOSIS — Z7984 Long term (current) use of oral hypoglycemic drugs: Secondary | ICD-10-CM | POA: Insufficient documentation

## 2020-11-27 DIAGNOSIS — I4819 Other persistent atrial fibrillation: Secondary | ICD-10-CM | POA: Diagnosis not present

## 2020-11-27 DIAGNOSIS — G4733 Obstructive sleep apnea (adult) (pediatric): Secondary | ICD-10-CM | POA: Insufficient documentation

## 2020-11-27 DIAGNOSIS — D649 Anemia, unspecified: Secondary | ICD-10-CM | POA: Diagnosis not present

## 2020-11-27 DIAGNOSIS — E119 Type 2 diabetes mellitus without complications: Secondary | ICD-10-CM | POA: Diagnosis not present

## 2020-11-27 DIAGNOSIS — Z7901 Long term (current) use of anticoagulants: Secondary | ICD-10-CM | POA: Diagnosis not present

## 2020-11-27 DIAGNOSIS — E039 Hypothyroidism, unspecified: Secondary | ICD-10-CM | POA: Diagnosis not present

## 2020-11-27 DIAGNOSIS — I10 Essential (primary) hypertension: Secondary | ICD-10-CM | POA: Insufficient documentation

## 2020-11-27 DIAGNOSIS — Z79899 Other long term (current) drug therapy: Secondary | ICD-10-CM | POA: Diagnosis not present

## 2020-11-27 MED ORDER — METOPROLOL TARTRATE 25 MG PO TABS: 25.0000 mg | ORAL_TABLET | Freq: Two times a day (BID) | ORAL | 1 refills | Status: DC

## 2020-11-27 MED ORDER — METOPROLOL TARTRATE 25 MG PO TABS: 25.0000 mg | ORAL_TABLET | Freq: Two times a day (BID) | ORAL | 3 refills | Status: DC

## 2020-11-27 NOTE — Telephone Encounter (Signed)
Pt called in stating she is back in AF this morning. HR 114 BP 100/61 dizzy/lightheaded. Discussed with [name] NP will see this afternoon in clinic no med changes at this point.

## 2020-11-27 NOTE — Patient Instructions (Signed)
Increase metoprolol to 25mg twice a day

## 2020-11-27 NOTE — Telephone Encounter (Signed)
Open in error

## 2020-11-27 NOTE — Progress Notes (Signed)
Primary Care Physician: Jinny Sanders, MD Primary Cardiologist: Dr Fletcher Anon Primary Electrophysiologist: Dr Rayann Heman Referring Physician: Dr Rayann Heman   [name] is a 75 y.o. female with a history of anemia, DM, HTN, hypothyroidism, OSA, MR, atrial fibrillation who presents for follow up in the [facility]. Patient is on Eliquis for a CHADS2VASC score of 4. She was seen at the ED 11/21/20 for afib with RVR and underwent DCCV at that time. Patient reports that she got up to use the restroom in the middle of the night and became very dizzy with the room spinning. She did fall, no LOC or head trauma. Dizziness is typically associated with her afib in the past although she does have a history of vertigo. She has been caring for a sick family member which puts her under considerable stress.   Today, she denies symptoms of palpitations, chest pain, shortness of breath, orthopnea, PND, lower extremity edema, presyncope, syncope, snoring, daytime somnolence, bleeding, or neurologic sequela. The patient is tolerating medications without difficulties and is otherwise without complaint today.    Atrial Fibrillation Risk Factors:  she does have symptoms or diagnosis of sleep apnea. she is compliant with CPAP therapy. she does not have a history of rheumatic fever.   she has a BMI of Body mass index is 27.79 kg/m.[name] Filed Weights   11/27/20 1543  Weight: 75.8 kg    Family History  Problem Relation Age of Onset   Cancer Mother        fallopian tube   Dementia Mother    Pulmonary embolism Mother    Hyperlipidemia Mother    Diabetes Mother    Hyperlipidemia Father    Lung cancer Father    Hypertension Sister    Diabetes Brother    Cancer Brother    Stroke Paternal Grandmother    Stroke Paternal Grandfather    Colon cancer Neg Hx    Esophageal cancer Neg Hx      Atrial Fibrillation Management history:  Previous antiarrhythmic drugs: flecainide Previous  cardioversions: 2018 x 2, 2020, 2021 x 2, 11/21/20 Previous ablations: 12/17/16 CHADS2VASC score: 4 Anticoagulation history: Eliquis   Past Medical History:  Diagnosis Date   Anemia    Anxiety    Atrial fibrillation ([place])    Colon polyps    Constipation    Depressive disorder, not elsewhere classified    Diabetes mellitus without complication ([place])    Diaphragmatic hernia without mention of obstruction or gangrene    Disorders of bursae and tendons in shoulder region, unspecified    Diverticulosis of colon (without mention of hemorrhage)    External hemorrhoid    Fundic gland polyps of stomach, benign    GERD (gastroesophageal reflux disease)    History of hiatal hernia    HTN (hypertension)    Hypertension    Hypothyroidism    Internal hemorrhoids without mention of complication    Moderate mitral regurgitation    Obesity, unspecified    OSA on CPAP    compliant with CPAP   Other urinary incontinence    Persistent atrial fibrillation ([place])    Pure hypercholesterolemia    Type II diabetes mellitus ([place])    Urinary frequency    Urinary urgency    Vitamin B 12 deficiency    Past Surgical History:  Procedure Laterality Date   ABDOMINAL HYSTERECTOMY     ABDOMINAL HYSTERECTOMY  1972   Have both ovaries   ATRIAL FIBRILLATION ABLATION  12/17/2016  ATRIAL FIBRILLATION ABLATION N/A 12/17/2016   Procedure: ATRIAL FIBRILLATION ABLATION;  Surgeon: Thompson Grayer, MD;  Location: [facility];  Service: Cardiovascular;  Laterality: N/A;   [place]   [facility]; "Dr. [name]"   CARDIOVERSION N/A 05/27/2016   Procedure: CARDIOVERSION;  Surgeon: [name], MD;  Location: [facility];  Service: Cardiovascular;  Laterality: N/A;   CARDIOVERSION N/A 11/25/2016   Procedure: CARDIOVERSION;  Surgeon: [name], MD;  Location: [facility];  Service: Cardiovascular;  Laterality: N/A;   CARDIOVERSION N/A 09/18/2018   Procedure: CARDIOVERSION;  Surgeon: [name], MD;  Location: [facility];  Service: Cardiovascular;  Laterality: N/A;   CARDIOVERSION N/A 07/28/2019   Procedure: CARDIOVERSION;  Surgeon: Kate Sable, MD;  Location: [facility];  Service: Cardiovascular;  Laterality: N/A;   CARDIOVERSION N/A 09/07/2019   Procedure: CARDIOVERSION;  Surgeon: Nelva Bush, MD;  Location: [facility];  Service: Cardiovascular;  Laterality: N/A;   CATARACT EXTRACTION W/ INTRAOCULAR LENS  IMPLANT, BILATERAL Bilateral    COLONOSCOPY     ELECTROPHYSIOLOGIC STUDY N/A 01/24/2016   Procedure: CARDIOVERSION;  Surgeon: [name], MD;  Location: [facility];  Service: Cardiovascular;  Laterality: N/A;   implantable loop recorder implantation  10/18/2019   Medtronic Reveal [place] model [id number]) implantable loop recorder    LAPAROSCOPIC CHOLECYSTECTOMY     REDUCTION MAMMAPLASTY Bilateral    TONSILLECTOMY     TOTAL HIP ARTHROPLASTY Right 05/12/2018   Procedure: TOTAL HIP ARTHROPLASTY ANTERIOR APPROACH;  Surgeon: Paralee Cancel, MD;  Location: [facility];  Service: Orthopedics;  Laterality: Right;  70 mins    Current Outpatient Medications  Medication Sig Dispense Refill   atorvastatin (LIPITOR) 10 MG tablet TAKE 1 TABLET BY MOUTH EVERYDAY AT BEDTIME 90 tablet 0   calcium carbonate (OS-CAL - DOSED IN MG OF ELEMENTAL CALCIUM) 1250 (500 Ca) MG tablet Take 1 tablet by mouth daily with breakfast.     ELIQUIS 5 MG TABS tablet TAKE 1 TABLET BY MOUTH TWICE A DAY 60 tablet 9   escitalopram (LEXAPRO) 10 MG tablet Take 1 tablet by mouth once daily 90 tablet 0   flecainide (TAMBOCOR) 100 MG tablet TAKE 1 TABLET BY MOUTH TWICE A DAY 180 tablet 0   hydroxypropyl methylcellulose / hypromellose (ISOPTO TEARS / GONIOVISC) 2.5 % ophthalmic solution Place 1 drop into both eyes 2 (two) times daily as needed for dry eyes.     levofloxacin (LEVAQUIN) 500 MG tablet Take 1 tablet (500 mg total) by mouth daily. 10 tablet 0   metFORMIN (GLUCOPHAGE-XR) 500 MG 24 hr tablet TAKE 2  TABLETS (1,000 MG TOTAL) BY MOUTH 2 (TWO) TIMES DAILY WITH A MEAL. 360 tablet 1   ONETOUCH ULTRA test strip CHECK BLOOD SUGAR 2 TIMES A DAY AS DIRECTED DX CODE E11.9 100 strip 7   Semaglutide (RYBELSUS) 7 MG TABS Take 7 mg by mouth daily. 30 tablet 5   solifenacin (VESICARE) 10 MG tablet TAKE 1 TABLET BY MOUTH EVERY DAY 30 tablet 5   traZODone (DESYREL) 50 MG tablet TAKE 1 TABLET BY MOUTH EVERY DAY AT BEDTIME AS NEEDED FOR SLEEP 30 tablet 5   metoprolol tartrate (LOPRESSOR) 25 MG tablet Take 1 tablet (25 mg total) by mouth 2 (two) times daily. 180 tablet 1   No current facility-administered medications for this encounter.    Allergies  Allergen Reactions   Cortisone Shortness Of Breath and Rash   Pravachol [Pravastatin Sodium]     Leg pain  Contrast Media [Iodinated Diagnostic Agents] Rash    Social History   Socioeconomic History   Marital status: Married    Spouse name: Jimmy   Number of children: 2   Years of education: Not on file   Highest education level: Not on file  Occupational History   Occupation: retired    Fish farm manager: UNEMPLOYED  Tobacco Use   Smoking status: Never   Smokeless tobacco: Never  Vaping Use   Vaping Use: Never used  Substance and Sexual Activity   Alcohol use: No    Alcohol/week: 0.0 standard drinks   Drug use: No   Sexual activity: Yes  Other Topics Concern   Not on file  Social History Narrative   ** Merged History Encounter **       Exercise walks 3 times daily Lives with spouse in [place] - retired from employment 1 son and 1 daughter  Diet fruit, occ fast food, salads and lean meat  3 tea/day, no EtOH, no tobacco, no drugs    Social Determinants of Radio broadcast assistant Strain: Low Risk    Difficulty of Paying Living Expenses: Not very hard  Food Insecurity: No Food Insecurity   Worried About Charity fundraiser in the Last Year: Never true   [place] in the Last Year: Never true  Transportation  Needs: No Transportation Needs   Lack of Transportation (Medical): No   Lack of Transportation (Non-Medical): No  Physical Activity: Insufficiently Active   Days of Exercise per Week: 3 days   Minutes of Exercise per Session: 40 min  Stress: No Stress Concern Present   Feeling of Stress : Not at all  Social Connections: Not on file  Intimate Partner Violence: Not At Risk   Fear of Current or Ex-Partner: No   Emotionally Abused: No   Physically Abused: No   Sexually Abused: No     ROS- All systems are reviewed and negative except as per the HPI above.  Physical Exam: Vitals:   11/27/20 1543  BP: 132/68  Pulse: 76  Weight: 75.8 kg  Height: 5\' 5"  (1.651 m)    GEN- The patient is a well appearing female, alert and oriented x 3 today.   Head- normocephalic, atraumatic Eyes-  Sclera clear, conjunctiva pink Ears- hearing intact Oropharynx- clear Neck- supple  Lungs- Clear to ausculation bilaterally, normal work of breathing Heart- Regular rate and rhythm, no murmurs, rubs or gallops  GI- soft, NT, ND, + BS Extremities- no clubbing, cyanosis, or edema MS- no significant deformity or atrophy Skin- no rash or lesion Psych- euthymic mood, full affect Neuro- strength and sensation are intact  Wt Readings from Last 3 Encounters:  11/27/20 75.8 kg  11/21/20 79.8 kg  11/20/20 74.9 kg    EKG today demonstrates  SR, PACs, 1st degree AV block Vent. rate 76 BPM PR interval 216 ms QRS duration 88 ms QT/QTcB 376/423 ms  Echo 04/24/20 demonstrated   1. Left ventricular ejection fraction, by estimation, is 55 to 60%. The  left ventricle has normal function. The left ventricle has no regional  wall motion abnormalities. The left ventricular internal cavity size was  mildly dilated. Left ventricular  diastolic parameters are indeterminate.   2. Right ventricular systolic function is normal. The right ventricular  size is normal. There is moderately elevated pulmonary artery  systolic  pressure. The estimated right ventricular systolic pressure is 23.7 mmHg.   3. Left atrial size was severely dilated.  4. The mitral valve is normal in structure. Moderate mitral valve  regurgitation. No evidence of mitral stenosis.   5. The aortic valve is normal in structure. Aortic valve regurgitation is  not visualized. Mild aortic valve sclerosis is present, with no evidence  of aortic valve stenosis.   6. The inferior vena cava is normal in size with greater than 50%  respiratory variability, suggesting right atrial pressure of 3 mmHg.   Comparison(s): Prior Echo showed LV EF 55-60%, severe LAE with eccentric  MR.   Epic records are reviewed at length today  CHA2DS2-VASc Score = 4  The patient's score is based upon: CHF History: 0 HTN History: 1 Diabetes History: 1 Stroke History: 0 Vascular Disease History: 0 Age Score: 1 Gender Score: 1      ASSESSMENT AND PLAN: 1. Persistent Atrial Fibrillation (ICD10:  I48.19) The patient's CHA2DS2-VASc score is 4, indicating a 4.8% annual risk of stroke.   S/p DCCV 11/21/20 She is in SR today. ? If dizzy spell related to afib. Will f/u with device clinic tomorrow to see if any abnormalities seen on ILR. Increase metoprolol to 25 mg BID Continue Eliquis 5 mg BID Continue flecainide 100 mg BID If alternate AAD is needed, could consider dofetilide.   2. Secondary Hypercoagulable State (ICD10:  D68.69) The patient is at significant risk for stroke/thromboembolism based upon her CHA2DS2-VASc Score of 4.  Continue Apixaban (Eliquis).   3. OSA The importance of adequate treatment of sleep apnea was discussed today in order to improve our ability to maintain sinus rhythm long term.  4. HTN Stable, med changes as above.  5. MR Moderate MR   Follow up in the AF clinic in 3 weeks.    [facility] [address] [place], [place] [address] [phone] 11/27/2020 4:23 PM

## 2020-11-28 LAB — CUP PACEART REMOTE DEVICE CHECK
Date Time Interrogation Session: 20220926025054
Implantable Pulse Generator Implant Date: 20210816

## 2020-11-29 ENCOUNTER — Ambulatory Visit (HOSPITAL_COMMUNITY): Payer: Medicare HMO | Admitting: Nurse Practitioner

## 2020-11-29 ENCOUNTER — Encounter: Payer: Medicare HMO | Admitting: Internal Medicine

## 2020-11-29 ENCOUNTER — Encounter (HOSPITAL_COMMUNITY): Payer: Self-pay

## 2020-12-01 DIAGNOSIS — E119 Type 2 diabetes mellitus without complications: Secondary | ICD-10-CM | POA: Diagnosis not present

## 2020-12-01 DIAGNOSIS — E1159 Type 2 diabetes mellitus with other circulatory complications: Secondary | ICD-10-CM

## 2020-12-01 DIAGNOSIS — I152 Hypertension secondary to endocrine disorders: Secondary | ICD-10-CM | POA: Diagnosis not present

## 2020-12-05 NOTE — Progress Notes (Signed)
Carelink Summary Report / Loop Recorder 

## 2020-12-10 ENCOUNTER — Other Ambulatory Visit: Payer: Self-pay | Admitting: Family Medicine

## 2020-12-19 ENCOUNTER — Other Ambulatory Visit: Payer: Self-pay

## 2020-12-19 ENCOUNTER — Encounter (HOSPITAL_COMMUNITY): Payer: Self-pay | Admitting: Nurse Practitioner

## 2020-12-19 ENCOUNTER — Ambulatory Visit (HOSPITAL_COMMUNITY)
Admission: RE | Admit: 2020-12-19 | Discharge: 2020-12-19 | Disposition: A | Discharge status: Home / Self Care | Payer: Medicare HMO | Source: Ambulatory Visit | Attending: Nurse Practitioner | Admitting: Nurse Practitioner

## 2020-12-19 VITALS — BP 168/84 | HR 66 | Ht 65.0 in | Wt 166.8 lb

## 2020-12-19 DIAGNOSIS — G4733 Obstructive sleep apnea (adult) (pediatric): Secondary | ICD-10-CM | POA: Diagnosis not present

## 2020-12-19 DIAGNOSIS — Z7901 Long term (current) use of anticoagulants: Secondary | ICD-10-CM | POA: Diagnosis not present

## 2020-12-19 DIAGNOSIS — D6869 Other thrombophilia: Secondary | ICD-10-CM | POA: Diagnosis not present

## 2020-12-19 DIAGNOSIS — Z79899 Other long term (current) drug therapy: Secondary | ICD-10-CM | POA: Diagnosis not present

## 2020-12-19 DIAGNOSIS — I4819 Other persistent atrial fibrillation: Secondary | ICD-10-CM | POA: Diagnosis not present

## 2020-12-19 DIAGNOSIS — Z8249 Family history of ischemic heart disease and other diseases of the circulatory system: Secondary | ICD-10-CM | POA: Diagnosis not present

## 2020-12-19 DIAGNOSIS — I10 Essential (primary) hypertension: Secondary | ICD-10-CM | POA: Diagnosis not present

## 2020-12-19 NOTE — Progress Notes (Signed)
Primary Care Physician: Jinny Sanders, MD Primary Cardiologist: Dr Fletcher Anon Primary Electrophysiologist: Dr Rayann Heman Referring Physician: Dr Rayann Heman   [name] is a 75 y.o. female with a history of anemia, DM, HTN, hypothyroidism, OSA, MR, atrial fibrillation who presents for follow up in the [facility]. Patient is on Eliquis for a CHADS2VASC score of 4. She was seen at the ED 11/21/20 for afib with RVR and underwent DCCV at that time. Patient reports that she got up to use the restroom in the middle of the night and became very dizzy with the room spinning. She did fall, no LOC or head trauma. Dizziness is typically associated with her afib in the past although she does have a history of vertigo. She has been caring for a sick family member which puts her under considerable stress.   F/u in afib clinic, 12/19/20. She is staying in [place]. Increase in BB with continued dose of flecainide appears to be keeping her in rhythm. She feels well.   Today, she denies symptoms of palpitations, chest pain, shortness of breath, orthopnea, PND, lower extremity edema, presyncope, syncope, snoring, daytime somnolence, bleeding, or neurologic sequela. The patient is tolerating medications without difficulties and is otherwise without complaint today.    Atrial Fibrillation Risk Factors:  she does have symptoms or diagnosis of sleep apnea. she is compliant with CPAP therapy. she does not have a history of rheumatic fever.   she has a BMI of Body mass index is 27.76 kg/m.[name] Filed Weights   12/19/20 1034  Weight: 75.7 kg    Family History  Problem Relation Age of Onset   Cancer Mother        fallopian tube   Dementia Mother    Pulmonary embolism Mother    Hyperlipidemia Mother    Diabetes Mother    Hyperlipidemia Father    Lung cancer Father    Hypertension Sister    Diabetes Brother    Cancer Brother    Stroke Paternal Grandmother    Stroke Paternal Grandfather     Colon cancer Neg Hx    Esophageal cancer Neg Hx      Atrial Fibrillation Management history:  Previous antiarrhythmic drugs: flecainide Previous cardioversions: 2018 x 2, 2020, 2021 x 2, 11/21/20 Previous ablations: 12/17/16 CHADS2VASC score: 4 Anticoagulation history: Eliquis   Past Medical History:  Diagnosis Date   Anemia    Anxiety    Atrial fibrillation ([place])    Colon polyps    Constipation    Depressive disorder, not elsewhere classified    Diabetes mellitus without complication ([place])    Diaphragmatic hernia without mention of obstruction or gangrene    Disorders of bursae and tendons in shoulder region, unspecified    Diverticulosis of colon (without mention of hemorrhage)    External hemorrhoid    Fundic gland polyps of stomach, benign    GERD (gastroesophageal reflux disease)    History of hiatal hernia    HTN (hypertension)    Hypertension    Hypothyroidism    Internal hemorrhoids without mention of complication    Moderate mitral regurgitation    Obesity, unspecified    OSA on CPAP    compliant with CPAP   Other urinary incontinence    Persistent atrial fibrillation ([place])    Pure hypercholesterolemia    Type II diabetes mellitus ([place])    Urinary frequency    Urinary urgency    Vitamin B 12 deficiency    Past  Surgical History:  Procedure Laterality Date   ABDOMINAL HYSTERECTOMY     ABDOMINAL HYSTERECTOMY  1972   Have both ovaries   ATRIAL FIBRILLATION ABLATION  12/17/2016   ATRIAL FIBRILLATION ABLATION N/A 12/17/2016   Procedure: ATRIAL FIBRILLATION ABLATION;  Surgeon: Thompson Grayer, MD;  Location: [facility];  Service: Cardiovascular;  Laterality: N/A;   [place]   [facility]; "Dr. [name]"   CARDIOVERSION N/A 05/27/2016   Procedure: CARDIOVERSION;  Surgeon: [name], MD;  Location: [facility];  Service: Cardiovascular;  Laterality: N/A;   CARDIOVERSION N/A 11/25/2016   Procedure: CARDIOVERSION;  Surgeon: [name], MD;  Location: [facility];  Service: Cardiovascular;  Laterality: N/A;   CARDIOVERSION N/A 09/18/2018   Procedure: CARDIOVERSION;  Surgeon: [name], MD;  Location: [facility];  Service: Cardiovascular;  Laterality: N/A;   CARDIOVERSION N/A 07/28/2019   Procedure: CARDIOVERSION;  Surgeon: Kate Sable, MD;  Location: [facility];  Service: Cardiovascular;  Laterality: N/A;   CARDIOVERSION N/A 09/07/2019   Procedure: CARDIOVERSION;  Surgeon: Nelva Bush, MD;  Location: [facility];  Service: Cardiovascular;  Laterality: N/A;   CATARACT EXTRACTION W/ INTRAOCULAR LENS  IMPLANT, BILATERAL Bilateral    COLONOSCOPY     ELECTROPHYSIOLOGIC STUDY N/A 01/24/2016   Procedure: CARDIOVERSION;  Surgeon: [name], MD;  Location: [facility];  Service: Cardiovascular;  Laterality: N/A;   implantable loop recorder implantation  10/18/2019   Medtronic Reveal [place] model [id number] G) implantable loop recorder    LAPAROSCOPIC CHOLECYSTECTOMY     REDUCTION MAMMAPLASTY Bilateral    TONSILLECTOMY     TOTAL HIP ARTHROPLASTY Right 05/12/2018   Procedure: TOTAL HIP ARTHROPLASTY ANTERIOR APPROACH;  Surgeon: Paralee Cancel, MD;  Location: [facility];  Service: Orthopedics;  Laterality: Right;  70 mins    Current Outpatient Medications  Medication Sig Dispense Refill   atorvastatin (LIPITOR) 10 MG tablet TAKE 1 TABLET BY MOUTH EVERYDAY AT BEDTIME 90 tablet 1   calcium carbonate (OS-CAL - DOSED IN MG OF ELEMENTAL CALCIUM) 1250 (500 Ca) MG tablet Take 1 tablet by mouth daily with breakfast.     ELIQUIS 5 MG TABS tablet TAKE 1 TABLET BY MOUTH TWICE A DAY 60 tablet 9   escitalopram (LEXAPRO) 10 MG tablet Take 1 tablet by mouth once daily 90 tablet 0   flecainide (TAMBOCOR) 100 MG tablet TAKE 1 TABLET BY MOUTH TWICE A DAY 180 tablet 0   hydroxypropyl methylcellulose / hypromellose (ISOPTO TEARS / GONIOVISC) 2.5 % ophthalmic solution Place 1 drop into both eyes 2 (two) times daily as needed for dry eyes.      metFORMIN (GLUCOPHAGE-XR) 500 MG 24 hr tablet TAKE 2 TABLETS (1,000 MG TOTAL) BY MOUTH 2 (TWO) TIMES DAILY WITH A MEAL. (Patient taking differently: Take 1,000 mg by mouth 2 (two) times daily with a meal. Patient is taking 1 tablet by mouth twice daily x 1 week) 360 tablet 1   metoprolol tartrate (LOPRESSOR) 25 MG tablet Take 1 tablet (25 mg total) by mouth 2 (two) times daily. 180 tablet 1   MYRBETRIQ 50 MG TB24 tablet Take 50 mg by mouth daily.     ONETOUCH ULTRA test strip CHECK BLOOD SUGAR 2 TIMES A DAY AS DIRECTED DX CODE E11.9 100 strip 7   Semaglutide (RYBELSUS) 7 MG TABS Take 7 mg by mouth daily. 30 tablet 5   traZODone (DESYREL) 50 MG tablet TAKE 1 TABLET BY MOUTH EVERY DAY AT BEDTIME AS NEEDED FOR SLEEP 30 tablet  5   No current facility-administered medications for this encounter.    Allergies  Allergen Reactions   Cortisone Shortness Of Breath and Rash   Pravachol [Pravastatin Sodium]     Leg pain    Contrast Media [Iodinated Diagnostic Agents] Rash    Social History   Socioeconomic History   Marital status: Married    Spouse name: Jimmy   Number of children: 2   Years of education: Not on file   Highest education level: Not on file  Occupational History   Occupation: retired    Fish farm manager: UNEMPLOYED  Tobacco Use   Smoking status: Never   Smokeless tobacco: Never  Vaping Use   Vaping Use: Never used  Substance and Sexual Activity   Alcohol use: No    Alcohol/week: 0.0 standard drinks   Drug use: No   Sexual activity: Yes  Other Topics Concern   Not on file  Social History Narrative   ** Merged History Encounter **       Exercise walks 3 times daily Lives with spouse in [place] - retired from employment 1 son and 1 daughter  Diet fruit, occ fast food, salads and lean meat  3 tea/day, no EtOH, no tobacco, no drugs    Social Determinants of Radio broadcast assistant Strain: Low Risk    Difficulty of Paying Living Expenses: Not very hard   Food Insecurity: No Food Insecurity   Worried About Charity fundraiser in the Last Year: Never true   [place] in the Last Year: Never true  Transportation Needs: No Transportation Needs   Lack of Transportation (Medical): No   Lack of Transportation (Non-Medical): No  Physical Activity: Insufficiently Active   Days of Exercise per Week: 3 days   Minutes of Exercise per Session: 40 min  Stress: No Stress Concern Present   Feeling of Stress : Not at all  Social Connections: Not on file  Intimate Partner Violence: Not At Risk   Fear of Current or Ex-Partner: No   Emotionally Abused: No   Physically Abused: No   Sexually Abused: No     ROS- All systems are reviewed and negative except as per the HPI above.  Physical Exam: Vitals:   12/19/20 1034  Weight: 75.7 kg  Height: 5\' 5"  (1.651 m)    GEN- The patient is a well appearing female, alert and oriented x 3 today.   Head- normocephalic, atraumatic Eyes-  Sclera clear, conjunctiva pink Ears- hearing intact Oropharynx- clear Neck- supple  Lungs- Clear to ausculation bilaterally, normal work of breathing Heart- Regular rate and rhythm, no murmurs, rubs or gallops  GI- soft, NT, ND, + BS Extremities- no clubbing, cyanosis, or edema MS- no significant deformity or atrophy Skin- no rash or lesion Psych- euthymic mood, full affect Neuro- strength and sensation are intact  Wt Readings from Last 3 Encounters:  12/19/20 75.7 kg  11/27/20 75.8 kg  11/21/20 79.8 kg    EKG today demonstrates  SR, PACs, 1st degree AV block Vent. rate 66 BPM PR interval 246 ms QRS duration 96 ms QT/QTcB 400/419 ms  Echo 04/24/20 demonstrated   1. Left ventricular ejection fraction, by estimation, is 55 to 60%. The  left ventricle has normal function. The left ventricle has no regional  wall motion abnormalities. The left ventricular internal cavity size was  mildly dilated. Left ventricular  diastolic parameters are indeterminate.    2. Right ventricular systolic function is normal. The right  ventricular  size is normal. There is moderately elevated pulmonary artery systolic  pressure. The estimated right ventricular systolic pressure is 48.8 mmHg.   3. Left atrial size was severely dilated.   4. The mitral valve is normal in structure. Moderate mitral valve  regurgitation. No evidence of mitral stenosis.   5. The aortic valve is normal in structure. Aortic valve regurgitation is  not visualized. Mild aortic valve sclerosis is present, with no evidence  of aortic valve stenosis.   6. The inferior vena cava is normal in size with greater than 50%  respiratory variability, suggesting right atrial pressure of 3 mmHg.   Comparison(s): Prior Echo showed LV EF 55-60%, severe LAE with eccentric  MR.   Epic records are reviewed at length today  CHA2DS2-VASc Score = 4  The patient's score is based upon: CHF History: 0 HTN History: 1 Diabetes History: 1 Stroke History: 0 Vascular Disease History: 0 Age Score: 1 Gender Score: 1      ASSESSMENT AND PLAN: 1. Persistent Atrial Fibrillation (ICD10:  I48.19) The patient's CHA2DS2-VASc score is 4, indicating a 4.8% annual risk of stroke.   S/p DCCV 11/21/20 She is in SR today.  Continue  metoprolol to 25 mg BID Continue Eliquis 5 mg BID Continue flecainide 100 mg BID If alternate AAD is needed, could consider dofetilide.   2. Secondary Hypercoagulable State (ICD10:  D68.69) The patient is at significant risk for stroke/thromboembolism based upon her CHA2DS2-VASc Score of 4.  Continue Apixaban (Eliquis).   3. OSA The importance of adequate treatment of sleep apnea was discussed today in order to improve our ability to maintain sinus rhythm long term.  4. HTN Elevated today. Recheck 158/80 At home BP's stable  Recheck later today at home   5. MR Moderate MR   Follow up  with [facility] office as scheduled  Afib clinic as needed   Butch Penny C. Debbie Bellucci,  [facility] [address] [place], [place] [address] [phone]

## 2020-12-22 ENCOUNTER — Telehealth: Payer: Medicare HMO

## 2020-12-22 ENCOUNTER — Telehealth: Payer: Self-pay

## 2020-12-22 NOTE — Telephone Encounter (Signed)
  Care Management   Follow Up Note   12/22/2020 Name: [name] MRN: [id number] DOB: 1945-06-11   Referred by: Jinny Sanders, MD Reason for referral : Chronic Care Management (HTN, DMII)   An unsuccessful telephone outreach was attempted today. The patient was referred to the case management team for assistance with care management and care coordination.   Follow Up Plan: A HIPPA compliant phone message was left for the patient providing contact information and requesting a return call.   Quinn Plowman RN,BSN,CCM RN Case Manager [place]  [phone]

## 2020-12-25 ENCOUNTER — Telehealth: Payer: Self-pay | Admitting: *Deleted

## 2020-12-25 DIAGNOSIS — R0981 Nasal congestion: Secondary | ICD-10-CM | POA: Diagnosis not present

## 2020-12-25 DIAGNOSIS — R519 Headache, unspecified: Secondary | ICD-10-CM | POA: Diagnosis not present

## 2020-12-25 DIAGNOSIS — J018 Other acute sinusitis: Secondary | ICD-10-CM | POA: Diagnosis not present

## 2020-12-25 NOTE — Chronic Care Management (AMB) (Signed)
  Care Management   Note  12/25/2020 Name: [name] MRN: [id number] DOB: 23-Mar-1945  [name] is a 76 y.o. year old female who is a primary care patient of Bedsole, Amy E, MD and is actively engaged with the care management team. I reached out to [name] by phone today to assist with re-scheduling a follow up visit with the RN Case Manager  Follow up plan: Unsuccessful telephone outreach attempt made. A HIPAA compliant phone message was left for the patient providing contact information and requesting a return call.   Julian Hy, [place] Management  Direct Dial: [phone]

## 2020-12-26 LAB — CUP PACEART REMOTE DEVICE CHECK
Date Time Interrogation Session: 20221023210029
Implantable Pulse Generator Implant Date: 20210816

## 2021-01-01 ENCOUNTER — Ambulatory Visit (INDEPENDENT_AMBULATORY_CARE_PROVIDER_SITE_OTHER): Payer: Medicare HMO

## 2021-01-01 DIAGNOSIS — I4819 Other persistent atrial fibrillation: Secondary | ICD-10-CM

## 2021-01-02 DIAGNOSIS — R350 Frequency of micturition: Secondary | ICD-10-CM | POA: Diagnosis not present

## 2021-01-02 DIAGNOSIS — N3946 Mixed incontinence: Secondary | ICD-10-CM | POA: Diagnosis not present

## 2021-01-08 ENCOUNTER — Telehealth (HOSPITAL_COMMUNITY): Payer: Self-pay | Admitting: *Deleted

## 2021-01-08 NOTE — Telephone Encounter (Signed)
Patient's husband called in stating patient woke up in AF this morning currently HR 137 BP 99/64 intermittently dizzy which is normal symptom for her when out of rhythm.  Has not taken her morning medications yet - instructed to take morning medications and call with update later this morning. Encouraged extra PO intake to increase BP. Verbalized understanding.

## 2021-01-08 NOTE — Telephone Encounter (Signed)
Patient called with update HR 49 BP 127/63 back in normal rhythm. Will call  if issues arise.

## 2021-01-08 NOTE — Progress Notes (Signed)
Carelink Summary Report / Loop Recorder 

## 2021-01-17 NOTE — Chronic Care Management (AMB) (Signed)
  Care Management   Note  01/17/2021 Name: [name] MRN: [id number] DOB: 07/21/1945  [name] is a 75 y.o. year old female who is a primary care patient of Bedsole, Amy E, MD and is actively engaged with the care management team. I reached out to [name] by phone today to assist with re-scheduling a follow up visit with the RN Case Manager  Follow up plan: Telephone appointment with care management team member scheduled for: 01/23/2021  Julian Hy, [place], [place] Management  Direct Dial: [phone]

## 2021-01-22 ENCOUNTER — Other Ambulatory Visit: Payer: Self-pay | Admitting: Internal Medicine

## 2021-01-22 ENCOUNTER — Other Ambulatory Visit: Payer: Self-pay | Admitting: Family Medicine

## 2021-01-22 ENCOUNTER — Other Ambulatory Visit: Payer: Self-pay | Admitting: Pulmonary Disease

## 2021-01-23 ENCOUNTER — Other Ambulatory Visit (HOSPITAL_COMMUNITY): Payer: Self-pay | Admitting: *Deleted

## 2021-01-23 ENCOUNTER — Ambulatory Visit (INDEPENDENT_AMBULATORY_CARE_PROVIDER_SITE_OTHER): Payer: Medicare HMO

## 2021-01-23 DIAGNOSIS — E119 Type 2 diabetes mellitus without complications: Secondary | ICD-10-CM

## 2021-01-23 DIAGNOSIS — I152 Hypertension secondary to endocrine disorders: Secondary | ICD-10-CM

## 2021-01-23 DIAGNOSIS — E1159 Type 2 diabetes mellitus with other circulatory complications: Secondary | ICD-10-CM

## 2021-01-23 DIAGNOSIS — I4819 Other persistent atrial fibrillation: Secondary | ICD-10-CM

## 2021-01-23 MED ORDER — METOPROLOL TARTRATE 25 MG PO TABS: 25.0000 mg | ORAL_TABLET | Freq: Two times a day (BID) | ORAL | 1 refills | Status: DC

## 2021-01-23 NOTE — Chronic Care Management (AMB) (Signed)
Chronic Care Management   CCM RN Visit Note  01/23/2021 Name: [name] MRN: [id number] DOB: [birth year]  Subjective: [name] is a 75 y.o. year old female who is a primary care patient of Bedsole, Amy E, MD. The care management team was consulted for assistance with disease management and care coordination needs.    Engaged with patient by telephone for follow up visit in response to provider referral for case management and/or care coordination services.   Consent to Services:  The patient was given information about Chronic Care Management services, agreed to services, and gave verbal consent prior to initiation of services.  Please see initial visit note for detailed documentation.   Patient agreed to services and verbal consent obtained.   Assessment: Review of patient past medical history, allergies, medications, health status, including review of consultants reports, laboratory and other test data, was performed as part of comprehensive evaluation and provision of chronic care management services.   SDOH (Social Determinants of Health) assessments and interventions performed:    CCM Care Plan  Allergies  Allergen Reactions   Cortisone Shortness Of Breath and Rash   Pravachol [Pravastatin Sodium]     Leg pain    Contrast Media [Iodinated Diagnostic Agents] Rash    Outpatient Encounter Medications as of 01/23/2021  Medication Sig   atorvastatin (LIPITOR) 10 MG tablet TAKE 1 TABLET BY MOUTH EVERYDAY AT BEDTIME   calcium carbonate (OS-CAL - DOSED IN MG OF ELEMENTAL CALCIUM) 1250 (500 Ca) MG tablet Take 1 tablet by mouth daily with breakfast.   ELIQUIS 5 MG TABS tablet TAKE 1 TABLET BY MOUTH TWICE A DAY   escitalopram (LEXAPRO) 10 MG tablet Take 1 tablet by mouth once daily   flecainide (TAMBOCOR) 100 MG tablet TAKE 1 TABLET BY MOUTH TWICE A DAY   hydroxypropyl methylcellulose / hypromellose (ISOPTO TEARS / GONIOVISC) 2.5 % ophthalmic solution Place 1 drop into both  eyes 2 (two) times daily as needed for dry eyes.   metFORMIN (GLUCOPHAGE-XR) 500 MG 24 hr tablet TAKE 2 TABLETS (1,000 MG TOTAL) BY MOUTH 2 (TWO) TIMES DAILY WITH A MEAL.   metoprolol tartrate (LOPRESSOR) 25 MG tablet Take 1 tablet (25 mg total) by mouth 2 (two) times daily.   MYRBETRIQ 50 MG TB24 tablet Take 50 mg by mouth daily.   traZODone (DESYREL) 50 MG tablet TAKE 1 TABLET BY MOUTH EVERY DAY AT BEDTIME AS NEEDED FOR SLEEP   trimethoprim (TRIMPEX) 100 MG tablet Take 100 mg by mouth daily.   ONETOUCH ULTRA test strip CHECK BLOOD SUGAR 2 TIMES A DAY AS DIRECTED DX CODE E11.9   Semaglutide (RYBELSUS) 7 MG TABS Take 7 mg by mouth daily. (Patient not taking: Reported on 01/23/2021)   No facility-administered encounter medications on file as of 01/23/2021.    Patient Active Problem List   Diagnosis Date Noted   Secondary hypercoagulable state ([place]) 11/27/2020   Cellulitis of left lower extremity 10/20/2020   Closed fracture of base of fifth metatarsal bone, left, initial encounter 07/26/2020   Ear pain, left 07/21/2019   Atrial tachycardia ([place]) 09/15/2018   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   Status post total hip replacement, 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   Lumbar radiculopathy 01/05/2018   Vitamin B 12 deficiency    Urge incontinence    OSA on CPAP    History of hiatal hernia    GERD (gastroesophageal reflux disease)    Anxiety    Other constipation 01/02/2017   Class 1 obesity with serious  comorbidity and body mass index (BMI) of 30.0 to 30.9 in adult 01/02/2017   Overweight (BMI 25.0-29.9) 12/19/2016   Persistent atrial fibrillation ([place]) 12/17/2016   Vitamin D deficiency 11/20/2016   Family history of ovarian cancer 05/24/2016   Iron deficiency anemia    Hypertension associated with diabetes ([place])    Preoperative evaluation to rule out surgical contraindication 05/16/2015   Controlled diabetes mellitus type II without complication ([place]) 34/19/6222   Mitral regurgitation 10/11/2013   Incidental  lung nodule 07/16/2011   SCIATICA, RIGHT 04/10/2010   Hyperlipidemia associated with type 2 diabetes mellitus ([place]) 06/11/2006   MDD (major depressive disorder), recurrent episode, mild ([place]) 06/11/2006   HEMORRHOIDS, INTERNAL 06/11/2006   DIVERTICULOSIS, COLON 06/11/2006   STRESS INCONTINENCE 06/11/2006    Conditions to be addressed/monitored:Atrial Fibrillation, HTN, and DMII   Care Plan : [facility] Plan of care  Updates made by Dannielle Karvonen, RN since 01/23/2021 12:00 AM     Problem: Chronic disease management , education, and/ or care coordination needs.   Priority: High     Long-Range Goal: Development of plan of care to address chronic disease manaement and / or care coordination needs   Start Date: 01/23/2021  Expected End Date: 05/01/2021  Priority: High  Note:   Current Barriers:  Chronic Disease Management support and education needs related to Atrial Fibrillation, HTN, and DMII Patient states she recently stopped the Rybellsus her provider started her on due to GI upset. She reports she will inform her provider on Monday 01/29/2021 if symptoms do not resolve. Patient states her blood sugars continue to range from 83- mid 100's.  Denies any blood sugars < 70.  Patient reports her blood pressures are doing well. Reports today's blood pressure prior to taking her medication was 143/67.  Patient reports having an ED visit on 11/21/2020 due to Afib. She states she had a successful cardioversion at that time and her heart medication was increased.  Reports having follow up with cardiologist since ED visit. Patient reports left leg wound has healed completely and she has been discharged from the wound clinic. Patient denies any further concerns.  RNCM Clinical Goal(s):  Patient will verbalize understanding of plan for management of Atrial Fibrillation, HTN, and DMII take all medications exactly as prescribed and will call provider for medication related questions attend all scheduled  medical appointments:  continue to work with RN Care Manager to address care management and care coordination needs related to  Atrial Fibrillation, HTN, and DMII through collaboration with RN Care manager, provider, and care team.   Interventions: 1:1 collaboration with primary care provider regarding development and update of comprehensive plan of care as evidenced by provider attestation and co-signature Inter-disciplinary care team collaboration (see longitudinal plan of care) Evaluation of current treatment plan related to  self management and patient's adherence to plan as established by provider  Hypertension Interventions: Goal on track: yes Last practice recorded BP readings:  BP Readings from Last 3 Encounters:  12/19/20 (!) 168/84  11/27/20 132/68  11/21/20 (!) 156/81  Most recent eGFR/CrCl:  Lab Results  Component Value Date   EGFR 60 05/25/2020    No components found for: CRCL  Evaluation of current treatment plan related to hypertension self management and patient's adherence to plan as established by provider; Reviewed medications with patient and discussed importance of compliance; Discussed plans with patient for ongoing care management follow up and provided patient with direct contact information for care management team; Reviewed scheduled/upcoming provider appointments  including:  Advised patient, providing education and rationale to continue to monitor blood pressure and record, calling PCP for findings outside of established parameters  Diabetes Interventions: Goal on track: Yes Assessed patient's understanding of A1c goal: <7% Reviewed medications with patient and discussed importance of medication adherence; Discussed plans with patient for ongoing care management follow up and provided patient with direct contact information for care management team; Reviewed scheduled/upcoming provider appointments including:  ; Advised patient to continue to monitor blood  sugars daily and notify her doctor for findings outside established parameters Review of patient status, including review of consultants reports, relevant laboratory and other test results, and medications completed.  Lab Results  Component Value Date   HGBA1C 6.7 (H) 09/26/2020  AFIB Interventions: New Goal    Reviewed importance of adherence to anticoagulant exactly as prescribed; Counseled on bleeding risk associated with blood thinning medications and importance of self-monitoring for signs/symptoms of bleeding; Counseled on avoidance of NSAIDs due to increased bleeding risk with anticoagulants;   Patient Goals/Self-Care Activities: Take medications as prescribed   Attend all scheduled provider appointments Call pharmacy for medication refills 3-7 days in advance of running out of medications Call provider office for new concerns or questions  Continue to check blood pressure 3 times per week and record Continue to follow a low salt diet Continue to check blood sugars as recommended by your provider ( take blood sugar diary to your provider visit) Follow Rule of 15 when managing low blood sugars:   RULE OF 15 How to treat low blood sugars (Blood sugar less than 70 mg/dl  Please follow the RULE OF 15 for the treatment of hypoglycemia treatment (When your blood sugars are less than 70 mg/ dl) STEP  1:  Take 15 grams of carbohydrates when your blood sugar is low, which includes:   3-4 glucose tabs or  3-4 oz of juice or regular soda or  One tube of glucose gel STEP 2:  Recheck blood sugar in 15 minutes STEP 3:  If your blood sugar is still low at the 15 minute recheck ---then, go back to STEP 1 and treat again with another 15 grams of carbohydrates Call your provider if you have a racing or irregular heartbeat that may be uncomfortable, shortness of breath with or without chest pain, weakness and dizziness.  Call 911 for severe symptoms such as chest pain, fainting and struggling to  breath.  Take blood thinner as prescribed ( notify provider for signs/ symptoms of bleeding: teeth/ gums, in urine, and/ or excessive bruising)         Plan:The patient has been provided with contact information for the care management team and has been advised to call with any health related questions or concerns.  The care management team will reach out to the patient again over the next 2-3 months . Quinn Plowman RN,BSN,CCM RN Case Manager [place]  [phone]

## 2021-01-23 NOTE — Patient Instructions (Signed)
Visit Information  Thank you for taking time to visit with me today. Please don't hesitate to contact me if I can be of assistance to you before our next scheduled telephone appointment.  Patient Goals/Self-Care Activities: Take medications as prescribed   Attend all scheduled provider appointments Call pharmacy for medication refills 3-7 days in advance of running out of medications Call provider office for new concerns or questions  Continue to check blood pressure 3 times per week and record Continue to follow a low salt diet Continue to check blood sugars as recommended by your provider ( take blood sugar diary to your provider visit) Follow Rule of 15 when managing low blood sugars:   RULE OF 15 How to treat low blood sugars (Blood sugar less than 70 mg/dl  Please follow the RULE OF 15 for the treatment of hypoglycemia treatment (When your blood sugars are less than 70 mg/ dl) STEP  1:  Take 15 grams of carbohydrates when your blood sugar is low, which includes:   3-4 glucose tabs or  3-4 oz of juice or regular soda or  One tube of glucose gel STEP 2:  Recheck blood sugar in 15 minutes STEP 3:  If your blood sugar is still low at the 15 minute recheck ---then, go back to STEP 1 and treat again with another 15 grams of carbohydrates Call your provider if you have a racing or irregular heartbeat that may be uncomfortable, shortness of breath with or without chest pain, weakness and dizziness.  Call 911 for severe symptoms such as chest pain, fainting and struggling to breath.  Take blood thinner as prescribed ( notify provider for signs/ symptoms of bleeding: teeth/ gums, in urine, and/ or excessive bruising)   Our next appointment is by telephone on April 17, 2021 at 11:30 am  Please call the care guide team at [phone] if you need to cancel or reschedule your appointment.    Patient verbalizes understanding of instructions provided today and agrees to view in [place].    Quinn Plowman RN,BSN,CCM RN Case Manager [place]  [phone]

## 2021-01-29 LAB — CUP PACEART REMOTE DEVICE CHECK
Date Time Interrogation Session: 20221125205924
Implantable Pulse Generator Implant Date: 20210816

## 2021-01-31 DIAGNOSIS — I10 Essential (primary) hypertension: Secondary | ICD-10-CM

## 2021-01-31 DIAGNOSIS — E1159 Type 2 diabetes mellitus with other circulatory complications: Secondary | ICD-10-CM

## 2021-01-31 DIAGNOSIS — Z7984 Long term (current) use of oral hypoglycemic drugs: Secondary | ICD-10-CM

## 2021-01-31 DIAGNOSIS — I4819 Other persistent atrial fibrillation: Secondary | ICD-10-CM | POA: Diagnosis not present

## 2021-02-01 ENCOUNTER — Other Ambulatory Visit: Payer: Self-pay | Admitting: Internal Medicine

## 2021-02-01 NOTE — Telephone Encounter (Signed)
Prescription refill request for Eliquis received. Indication: Afib  Last office visit:12/19/20 [name]) Scr: 1.01 (11/21/20) Age: 75 Weight: 75.7kg  Appropriate dose and refill sent to requested pharmacy.

## 2021-02-05 ENCOUNTER — Ambulatory Visit (INDEPENDENT_AMBULATORY_CARE_PROVIDER_SITE_OTHER): Payer: Medicare HMO

## 2021-02-05 ENCOUNTER — Other Ambulatory Visit: Payer: Self-pay | Admitting: Internal Medicine

## 2021-02-05 ENCOUNTER — Encounter: Payer: Self-pay | Admitting: Internal Medicine

## 2021-02-05 DIAGNOSIS — I4819 Other persistent atrial fibrillation: Secondary | ICD-10-CM | POA: Diagnosis not present

## 2021-02-05 MED ORDER — LINAGLIPTIN 5 MG PO TABS: 5.0000 mg | ORAL_TABLET | Freq: Every day | ORAL | 3 refills | Status: DC

## 2021-02-14 NOTE — Progress Notes (Signed)
Carelink Summary Report / Loop Recorder 

## 2021-02-20 ENCOUNTER — Other Ambulatory Visit: Payer: Self-pay

## 2021-02-20 ENCOUNTER — Ambulatory Visit: Payer: Medicare HMO | Admitting: Family

## 2021-02-20 ENCOUNTER — Ambulatory Visit (INDEPENDENT_AMBULATORY_CARE_PROVIDER_SITE_OTHER): Payer: Medicare HMO | Admitting: Family Medicine

## 2021-02-20 ENCOUNTER — Encounter: Payer: Self-pay | Admitting: Family Medicine

## 2021-02-20 VITALS — BP 120/62 | HR 50 | Temp 97.9°F | Ht 65.0 in | Wt 167.5 lb

## 2021-02-20 DIAGNOSIS — D6869 Other thrombophilia: Secondary | ICD-10-CM

## 2021-02-20 DIAGNOSIS — R413 Other amnesia: Secondary | ICD-10-CM | POA: Diagnosis not present

## 2021-02-20 DIAGNOSIS — I4819 Other persistent atrial fibrillation: Secondary | ICD-10-CM | POA: Diagnosis not present

## 2021-02-20 DIAGNOSIS — Z818 Family history of other mental and behavioral disorders: Secondary | ICD-10-CM

## 2021-02-20 DIAGNOSIS — G44311 Acute post-traumatic headache, intractable: Secondary | ICD-10-CM | POA: Diagnosis not present

## 2021-02-20 DIAGNOSIS — I152 Hypertension secondary to endocrine disorders: Secondary | ICD-10-CM

## 2021-02-20 DIAGNOSIS — R296 Repeated falls: Secondary | ICD-10-CM | POA: Diagnosis not present

## 2021-02-20 DIAGNOSIS — R42 Dizziness and giddiness: Secondary | ICD-10-CM | POA: Diagnosis not present

## 2021-02-20 DIAGNOSIS — E1159 Type 2 diabetes mellitus with other circulatory complications: Secondary | ICD-10-CM | POA: Diagnosis not present

## 2021-02-20 DIAGNOSIS — R69 Illness, unspecified: Secondary | ICD-10-CM | POA: Diagnosis not present

## 2021-02-20 LAB — COMPREHENSIVE METABOLIC PANEL
ALT: 9 U/L (ref 0–35)
AST: 15 U/L (ref 0–37)
Albumin: 4.2 g/dL (ref 3.5–5.2)
Alkaline Phosphatase: 51 U/L (ref 39–117)
BUN: 15 mg/dL (ref 6–23)
CO2: 29 mEq/L (ref 19–32)
Calcium: 9.7 mg/dL (ref 8.4–10.5)
Chloride: 99 mEq/L (ref 96–112)
Creatinine, Ser: 1 mg/dL (ref 0.40–1.20)
GFR: 55.73 mL/min — ABNORMAL LOW (ref >60.00)
Glucose, Bld: 159 mg/dL — ABNORMAL HIGH (ref 70–99)
Potassium: 4.9 mEq/L (ref 3.5–5.1)
Sodium: 133 mEq/L — ABNORMAL LOW (ref 135–145)
Total Bilirubin: 0.3 mg/dL (ref 0.2–1.2)
Total Protein: 6.6 g/dL (ref 6.0–8.3)

## 2021-02-20 LAB — CBC WITH DIFFERENTIAL/PLATELET
Basophils Absolute: 0 10*3/uL (ref 0.0–0.1)
Basophils Relative: 0.5 % (ref 0.0–3.0)
Eosinophils Absolute: 0.1 10*3/uL (ref 0.0–0.7)
Eosinophils Relative: 1.5 % (ref 0.0–5.0)
HCT: 33.2 % — ABNORMAL LOW (ref 36.0–46.0)
Hemoglobin: 10.8 g/dL — ABNORMAL LOW (ref 12.0–15.0)
Lymphocytes Relative: 23.4 % (ref 12.0–46.0)
Lymphs Abs: 1.5 10*3/uL (ref 0.7–4.0)
MCHC: 32.6 g/dL (ref 30.0–36.0)
MCV: 88.8 fl (ref 78.0–100.0)
Monocytes Absolute: 0.4 10*3/uL (ref 0.1–1.0)
Monocytes Relative: 6 % (ref 3.0–12.0)
Neutro Abs: 4.3 10*3/uL (ref 1.4–7.7)
Neutrophils Relative %: 68.6 % (ref 43.0–77.0)
Platelets: 301 10*3/uL (ref 150.0–400.0)
RBC: 3.74 Mil/uL — ABNORMAL LOW (ref 3.87–5.11)
RDW: 14.4 % (ref 11.5–15.5)
WBC: 6.3 10*3/uL (ref 4.0–10.5)

## 2021-02-20 LAB — TSH: TSH: 2.43 u[IU]/mL (ref 0.35–5.50)

## 2021-02-20 LAB — VITAMIN B12: Vitamin B-12: >1550 pg/mL — ABNORMAL HIGH (ref 211–911)

## 2021-02-20 LAB — VITAMIN D 25 HYDROXY (VIT D DEFICIENCY, FRACTURES): VITD: 67.93 ng/mL (ref 30.00–100.00)

## 2021-02-20 NOTE — Assessment & Plan Note (Signed)
?   If related to dizziness vs medications to treat causing dizziness.  HR is low today in 50s.

## 2021-02-20 NOTE — Assessment & Plan Note (Signed)
This appears to be more chronic issue although worsening in last 8-9 months.  Eval with labs.  mother with Alzheimer, pt also at risk for vascular dementia.  MRI brain may be needed for complete eval at later date.

## 2021-02-20 NOTE — Assessment & Plan Note (Addendum)
New, worsening Resulting in many falls. Possible vertigo, vertical, but pt denies room spinning.. feels like she needs run forward to catch up with head. Treat ear pressure with flonase 2 sprays per nostril daily.  Will eval for other secondary cause in light of fatigue... eval with cbc, CMET, B12 , vit D, TSH  Encouraged her to use four pronged cane for stability.

## 2021-02-20 NOTE — Patient Instructions (Signed)
Start flonase 2 sprays per nostril. Please stop at the lab to have labs drawn. We will set up a head CT ASAP for you.

## 2021-02-20 NOTE — Assessment & Plan Note (Signed)
High risk for bleeding on Eliquis.

## 2021-02-20 NOTE — Assessment & Plan Note (Signed)
Possibly due to repeated concussions, but given on blood thinner (Eliquis) and persistent new headache.. eval with CT head to rule out intracranial bleed or mass.

## 2021-02-20 NOTE — Assessment & Plan Note (Signed)
Stable, chronic.  Continue current medication.    

## 2021-02-20 NOTE — Progress Notes (Signed)
Patient ID: [name], female    DOB: 09-18-1945, 75 y.o.   MRN: [id number]  This visit was conducted in person.  BP 120/62    Pulse (!) 50    Temp 97.9 F (36.6 C) (Temporal)    Ht 5\' 5"  (1.651 m)    Wt 167 lb 8 oz (76 kg)    LMP  (LMP Unknown)    SpO2 99%    BMI 27.87 kg/m    CC:  Chief Complaint  Patient presents with   Dizziness    Worse when bending over-"Head feels funny"   Fall    X 6 times   Extremity Weakness    Subjective:   HPI: [name] is a 75 y.o. female  with diabetes, HTN, anemia, atrial fibrillation presenting on 02/20/2021 for Dizziness (Worse when bending over-"Head feels funny"), Fall (X 6 times), and Extremity Weakness   She reports new onset dizziness in last  month.  She describes it as balance off. No clear room spinning.  Ears feel full, pressured ( no nasal congestion, no sneeze) Has headache constantly 3/10 on pain scale frontal. She has been more fatigued, no SOB. Feels  when picking up gallon milk.[name] arms are weak,  bilaterally, not really legs.  When walking back to car once she was leaning to left. Worse with bending over   No new numbness, no  slurred speech, increase in memory loss in last 6-8 months.   Dizziness has resulted in  6 falls in lasts 6 weeks.   Hit head several times with theses falls... no constant headache.   Did have a fall from a ladder seen at ER  1 year ago.   No low blood sugars.. HR is at 50  No new medications or dose changes.  No blood loss, vaginally, rectally or other.  Has  vertigo in past years ago.  Mother with Alzheimer. Relevant past medical, surgical, family and social history reviewed and updated as indicated. Interim medical history since our last visit reviewed. Allergies and medications reviewed and updated. Outpatient Medications Prior to Visit  Medication Sig Dispense Refill   atorvastatin (LIPITOR) 10 MG tablet TAKE 1 TABLET BY MOUTH EVERYDAY AT BEDTIME 90 tablet 1   calcium carbonate  (OS-CAL - DOSED IN MG OF ELEMENTAL CALCIUM) 1250 (500 Ca) MG tablet Take 1 tablet by mouth daily with breakfast.     ELIQUIS 5 MG TABS tablet TAKE 1 TABLET BY MOUTH TWICE A DAY 60 tablet 9   escitalopram (LEXAPRO) 10 MG tablet Take 1 tablet by mouth once daily 90 tablet 1   flecainide (TAMBOCOR) 100 MG tablet TAKE 1 TABLET BY MOUTH TWICE A DAY 180 tablet 0   hydroxypropyl methylcellulose / hypromellose (ISOPTO TEARS / GONIOVISC) 2.5 % ophthalmic solution Place 1 drop into both eyes 2 (two) times daily as needed for dry eyes.     linagliptin (TRADJENTA) 5 MG TABS tablet Take 1 tablet (5 mg total) by mouth daily. 90 tablet 3   metFORMIN (GLUCOPHAGE-XR) 500 MG 24 hr tablet TAKE 2 TABLETS (1,000 MG TOTAL) BY MOUTH 2 (TWO) TIMES DAILY WITH A MEAL. 360 tablet 1   metoprolol tartrate (LOPRESSOR) 25 MG tablet Take 1 tablet (25 mg total) by mouth 2 (two) times daily. 180 tablet 1   MYRBETRIQ 50 MG TB24 tablet Take 50 mg by mouth daily.     ONETOUCH ULTRA test strip CHECK BLOOD SUGAR 2 TIMES A DAY AS DIRECTED DX CODE E11.9  100 strip 7   traZODone (DESYREL) 50 MG tablet TAKE 1 TABLET BY MOUTH EVERY DAY AT BEDTIME AS NEEDED FOR SLEEP 30 tablet 5   trimethoprim (TRIMPEX) 100 MG tablet Take 100 mg by mouth daily.     No facility-administered medications prior to visit.     Per HPI unless specifically indicated in ROS section below Review of Systems  Constitutional:  Positive for fatigue. Negative for fever.  HENT:  Negative for congestion and ear pain.   Eyes:  Negative for pain.  Respiratory:  Negative for cough and shortness of breath.   Cardiovascular:  Negative for chest pain, palpitations and leg swelling.  Gastrointestinal:  Negative for abdominal pain.  Genitourinary:  Negative for dysuria and vaginal bleeding.  Musculoskeletal:  Negative for back pain.  Neurological:  Positive for dizziness, weakness, light-headedness and headaches. Negative for syncope and numbness.  Psychiatric/Behavioral:   Negative for dysphoric mood.   Objective:  BP 120/62    Pulse (!) 50    Temp 97.9 F (36.6 C) (Temporal)    Ht 5\' 5"  (1.651 m)    Wt 167 lb 8 oz (76 kg)    LMP  (LMP Unknown)    SpO2 99%    BMI 27.87 kg/m   Wt Readings from Last 3 Encounters:  02/20/21 167 lb 8 oz (76 kg)  12/19/20 166 lb 12.8 oz (75.7 kg)  11/27/20 167 lb (75.8 kg)      Physical Exam Constitutional:      General: She is not in acute distress.    Appearance: Normal appearance. She is well-developed. She is not ill-appearing or toxic-appearing.  HENT:     Head: Normocephalic.     Right Ear: Hearing, tympanic membrane, ear canal and external ear normal. Tympanic membrane is not erythematous, retracted or bulging.     Left Ear: Hearing, tympanic membrane, ear canal and external ear normal. Tympanic membrane is not erythematous, retracted or bulging.     Nose: No mucosal edema or rhinorrhea.     Right Sinus: No maxillary sinus tenderness or frontal sinus tenderness.     Left Sinus: No maxillary sinus tenderness or frontal sinus tenderness.     Mouth/Throat:     Pharynx: Uvula midline.  Eyes:     General: Lids are normal. Lids are everted, no foreign bodies appreciated.     Conjunctiva/sclera: Conjunctivae normal.     Pupils: Pupils are equal, round, and reactive to light.  Neck:     Thyroid: No thyroid mass or thyromegaly.     Vascular: No carotid bruit.     Trachea: Trachea normal.  Cardiovascular:     Rate and Rhythm: Normal rate and regular rhythm.     Pulses: Normal pulses.     Heart sounds: Normal heart sounds, S1 normal and S2 normal. No murmur heard.   No friction rub. No gallop.  Pulmonary:     Effort: Pulmonary effort is normal. No tachypnea or respiratory distress.     Breath sounds: Normal breath sounds. No decreased breath sounds, wheezing, rhonchi or rales.  Abdominal:     General: Bowel sounds are normal.     Palpations: Abdomen is soft.     Tenderness: There is no abdominal tenderness.   Musculoskeletal:     Cervical back: Normal range of motion and neck supple.  Skin:    General: Skin is warm and dry.     Findings: No rash.  Neurological:     Mental Status: She is alert  and oriented to person, place, and time.     GCS: GCS eye subscore is 4. GCS verbal subscore is 5. GCS motor subscore is 6.     Cranial Nerves: No cranial nerve deficit.     Sensory: Sensation is intact. No sensory deficit.     Motor: Motor function is intact. No abnormal muscle tone.     Coordination: Romberg sign negative. Coordination normal. Finger-Nose-Finger Test normal. Rapid alternating movements normal.     Gait: Gait abnormal.     Deep Tendon Reflexes: Reflexes are normal and symmetric.     Comments: Nml cerebellar exam   No papilledema  Hesitant when walking as off balance  Psychiatric:        Mood and Affect: Mood is not anxious or depressed.        Speech: Speech normal.        Behavior: Behavior normal. Behavior is cooperative.        Thought Content: Thought content normal.        Cognition and Memory: Memory is not impaired. She does not exhibit impaired recent memory or impaired remote memory.        Judgment: Judgment normal.      Results for orders placed or performed in visit on 02/05/21  CUP PACEART REMOTE DEVICE CHECK  Result Value Ref Range   Date Time Interrogation Session [phone]    Pulse Generator Manufacturer MER[employer]    Pulse Gen Model LNQ22 LINQ II    Pulse Gen Serial Number [id number] [facility]    Implantable Pulse Generator Type ICM/ILR    Implantable Pulse Generator Implant Date [id number]     This visit occurred during the SARS-CoV-2 public health emergency.  Safety protocols were in place, including screening questions prior to the visit, additional usage of staff PPE, and extensive cleaning of exam room while observing appropriate contact time as indicated for disinfecting solutions.   COVID 19 screen:  No recent travel or known  exposure to COVID19 The patient denies respiratory symptoms of COVID 19 at this time. The importance of social distancing was discussed today.   Assessment and Plan    Problem List Items Addressed This Visit     Dizziness - Primary    New, worsening Resulting in many falls. Possible vertigo, vertical, but pt denies room spinning.. feels like she needs run forward to catch up with head. Treat ear pressure with flonase 2 sprays per nostril daily.  Will eval for other secondary cause in light of fatigue... eval with cbc, CMET, B12 , vit D, TSH  Encouraged her to use four pronged cane for stability.       Relevant Orders   CT HEAD WO CONTRAST (5MM)   CBC with Differential/Platelet   Vitamin B12   Comprehensive metabolic panel   TSH   VITAMIN D 25 Hydroxy (Vit-D Deficiency, Fractures)   Hypertension associated with diabetes (HCC)    Stable, chronic.  Continue current medication.         Intractable acute post-traumatic headache    Possibly due to repeated concussions, but given on blood thinner (Eliquis) and persistent new headache.. eval with CT head to rule out intracranial bleed or mass.      Relevant Orders   CT HEAD WO CONTRAST (5MM)   Memory loss     This appears to be more chronic issue although worsening in last 8-9 months.  Eval with labs.  mother with Alzheimer, pt also at risk  for vascular dementia.  MRI brain may be needed for complete eval at later date.      Relevant Orders   CBC with Differential/Platelet   Vitamin B12   Comprehensive metabolic panel   TSH   VITAMIN D 25 Hydroxy (Vit-D Deficiency, Fractures)   Persistent atrial fibrillation ([place])    ? If related to dizziness vs medications to treat causing dizziness.  HR is low today in 50s.      Secondary hypercoagulable state ([place])    High risk for bleeding on Eliquis.      Other Visit Diagnoses     Frequent falls       Relevant Orders   CT HEAD WO CONTRAST (5MM)   Family history of  first degree relative with dementia          Orders Placed This Encounter  Procedures   CT HEAD WO CONTRAST (5MM)    [place] preferred  [place]/[facility]     Order Specific Question:   Preferred imaging location?    Answer:   [facility] - [address]   CBC with Differential/Platelet   Vitamin B12   Comprehensive metabolic panel   TSH   VITAMIN D 25 Hydroxy (Vit-D Deficiency, Fractures)     Eliezer Lofts, MD

## 2021-02-22 ENCOUNTER — Other Ambulatory Visit: Payer: Self-pay

## 2021-02-22 ENCOUNTER — Ambulatory Visit (INDEPENDENT_AMBULATORY_CARE_PROVIDER_SITE_OTHER)
Admission: RE | Admit: 2021-02-22 | Discharge: 2021-02-22 | Disposition: A | Discharge status: Home / Self Care | Payer: Medicare HMO | Source: Ambulatory Visit | Attending: Family Medicine | Admitting: Family Medicine

## 2021-02-22 DIAGNOSIS — R42 Dizziness and giddiness: Secondary | ICD-10-CM

## 2021-02-22 DIAGNOSIS — G44311 Acute post-traumatic headache, intractable: Secondary | ICD-10-CM | POA: Diagnosis not present

## 2021-02-22 DIAGNOSIS — R296 Repeated falls: Secondary | ICD-10-CM

## 2021-02-22 DIAGNOSIS — R519 Headache, unspecified: Secondary | ICD-10-CM | POA: Diagnosis not present

## 2021-02-27 ENCOUNTER — Other Ambulatory Visit: Payer: Self-pay | Admitting: *Deleted

## 2021-02-27 DIAGNOSIS — M79641 Pain in right hand: Secondary | ICD-10-CM | POA: Insufficient documentation

## 2021-02-27 DIAGNOSIS — S62306A Unspecified fracture of fifth metacarpal bone, right hand, initial encounter for closed fracture: Secondary | ICD-10-CM | POA: Diagnosis not present

## 2021-02-27 DIAGNOSIS — S62309A Unspecified fracture of unspecified metacarpal bone, initial encounter for closed fracture: Secondary | ICD-10-CM | POA: Insufficient documentation

## 2021-02-27 MED ORDER — ESCITALOPRAM OXALATE 10 MG PO TABS: 10.0000 mg | ORAL_TABLET | Freq: Every day | ORAL | 1 refills | Status: DC

## 2021-03-01 ENCOUNTER — Telehealth: Payer: Self-pay | Admitting: Family Medicine

## 2021-03-01 ENCOUNTER — Other Ambulatory Visit: Payer: Self-pay

## 2021-03-01 ENCOUNTER — Encounter: Payer: Self-pay | Admitting: Adult Health

## 2021-03-01 ENCOUNTER — Ambulatory Visit: Payer: Medicare HMO | Admitting: Adult Health

## 2021-03-01 DIAGNOSIS — Z9989 Dependence on other enabling machines and devices: Secondary | ICD-10-CM | POA: Diagnosis not present

## 2021-03-01 DIAGNOSIS — R296 Repeated falls: Secondary | ICD-10-CM

## 2021-03-01 DIAGNOSIS — R413 Other amnesia: Secondary | ICD-10-CM

## 2021-03-01 DIAGNOSIS — G4733 Obstructive sleep apnea (adult) (pediatric): Secondary | ICD-10-CM | POA: Diagnosis not present

## 2021-03-01 DIAGNOSIS — R42 Dizziness and giddiness: Secondary | ICD-10-CM

## 2021-03-01 NOTE — Progress Notes (Signed)
@Patient  ID: [name], female    DOB: March 15, 1945, 75 y.o.   MRN: [id number]  Chief Complaint  Patient presents with   Follow-up    Referring provider: Jinny Sanders, MD  HPI: 75 year old female seen for sleep consult September 16, 2019 for sleep apnea with CPAP intolerance Patient had COVID-19 infection in December 2020 treated with a monoclonal antibody infusion Medical history significant for diabetes and atrial fibrillation  TEST/EVENTS :  HST 09/28/19 >> AHI 12.6, SpO2 low 75%   PSG 09/14/08 ([place]) >> AHI 32, SpO2 86% Echo 04/15/19 >> EF 55 to 60%, mild MR, severe LA dilation   03/01/2021 Follow up: OSA  Patient presents for a follow-up visit.  She was last seen August 2021.  Patient was previously diagnosed with severe sleep apnea in 2010.  She had difficulty wearing her CPAP.  She was seen in the office July 2021 for a sleep consult.  A home sleep study September 28, 2019 showed mild sleep apnea with AHI at 12.6/hour and a SPO2 low at 75%. Patient says she continues to have ongoing difficulties wearing her CPAP.  Patient says she is had multiple things that have been hindering her from wearing CPAP.  She has balance issues and recently broke her hand and is in a cast. Patient says she does continue to have some daytime sleepiness.  As above she has a history of A. fib. She says she will restart CPAP . Patient education on OSA discussed , CPAP usage discussed .    Allergies  Allergen Reactions   Cortisone Shortness Of Breath and Rash   Pravachol [Pravastatin Sodium]     Leg pain    Contrast Media [Iodinated Contrast Media] Rash    Immunization History  Administered Date(s) Administered   Fluad Quad(high Dose 65+) 11/17/2018   Influenza Split 01/03/2011, 01/17/2012   Influenza Whole 11/25/2007, 01/18/2009, 11/28/2009   Influenza, High Dose Seasonal PF 12/19/2016, 12/01/2017   Influenza,inj,Quad PF,6+ Mos 01/06/2013, 12/07/2013, 12/14/2014, 11/24/2015, 02/10/2020    Pneumococcal Conjugate-13 05/11/2013   Pneumococcal Polysaccharide-23 01/17/2012, 05/26/2017   Td 04/10/2010, 10/20/2020   Zoster, Live 05/13/2014    Past Medical History:  Diagnosis Date   Anemia    Anxiety    Atrial fibrillation (HCC)    Colon polyps    Constipation    Depressive disorder, not elsewhere classified    Diabetes mellitus without complication ([place])    Diaphragmatic hernia without mention of obstruction or gangrene    Disorders of bursae and tendons in shoulder region, unspecified    Diverticulosis of colon (without mention of hemorrhage)    External hemorrhoid    Fundic gland polyps of stomach, benign    GERD (gastroesophageal reflux disease)    History of hiatal hernia    HTN (hypertension)    Hypertension    Hypothyroidism    Internal hemorrhoids without mention of complication    Moderate mitral regurgitation    Obesity, unspecified    OSA on CPAP    compliant with CPAP   Other urinary incontinence    Persistent atrial fibrillation (HCC)    Pure hypercholesterolemia    Type II diabetes mellitus (HCC)    Urinary frequency    Urinary urgency    Vitamin B 12 deficiency     Tobacco History: Social History   Tobacco Use  Smoking Status Never  Smokeless Tobacco Never   Counseling given: Not Answered   Outpatient Medications Prior to Visit  Medication Sig Dispense Refill  atorvastatin (LIPITOR) 10 MG tablet TAKE 1 TABLET BY MOUTH EVERYDAY AT BEDTIME 90 tablet 1   calcium carbonate (OS-CAL - DOSED IN MG OF ELEMENTAL CALCIUM) 1250 (500 Ca) MG tablet Take 1 tablet by mouth daily with breakfast.     ELIQUIS 5 MG TABS tablet TAKE 1 TABLET BY MOUTH TWICE A DAY 60 tablet 9   escitalopram (LEXAPRO) 10 MG tablet Take 1 tablet (10 mg total) by mouth daily. 90 tablet 1   flecainide (TAMBOCOR) 100 MG tablet TAKE 1 TABLET BY MOUTH TWICE A DAY 180 tablet 0   hydroxypropyl methylcellulose / hypromellose (ISOPTO TEARS / GONIOVISC) 2.5 % ophthalmic solution Place 1  drop into both eyes 2 (two) times daily as needed for dry eyes.     linagliptin (TRADJENTA) 5 MG TABS tablet Take 1 tablet (5 mg total) by mouth daily. 90 tablet 3   metFORMIN (GLUCOPHAGE-XR) 500 MG 24 hr tablet TAKE 2 TABLETS (1,000 MG TOTAL) BY MOUTH 2 (TWO) TIMES DAILY WITH A MEAL. 360 tablet 1   metoprolol tartrate (LOPRESSOR) 25 MG tablet Take 1 tablet (25 mg total) by mouth 2 (two) times daily. 180 tablet 1   MYRBETRIQ 50 MG TB24 tablet Take 50 mg by mouth daily.     ONETOUCH ULTRA test strip CHECK BLOOD SUGAR 2 TIMES A DAY AS DIRECTED DX CODE E11.9 100 strip 7   traZODone (DESYREL) 50 MG tablet TAKE 1 TABLET BY MOUTH EVERY DAY AT BEDTIME AS NEEDED FOR SLEEP 30 tablet 5   trimethoprim (TRIMPEX) 100 MG tablet Take 100 mg by mouth daily.     No facility-administered medications prior to visit.     Review of Systems:   Constitutional:   No  weight loss, night sweats,  Fevers, chills,  +fatigue, or  lassitude.  HEENT:   No headaches,  Difficulty swallowing,  Tooth/dental problems, or  Sore throat,                No sneezing, itching, ear ache, nasal congestion, post nasal drip,   CV:  No chest pain,  Orthopnea, PND, swelling in lower extremities, anasarca, dizziness, palpitations, syncope.   GI  No heartburn, indigestion, abdominal pain, nausea, vomiting, diarrhea, change in bowel habits, loss of appetite, bloody stools.   Resp: No shortness of breath with exertion or at rest.  No excess mucus, no productive cough,  No non-productive cough,  No coughing up of blood.  No change in color of mucus.  No wheezing.  No chest wall deformity  Skin: no rash or lesions.  GU: no dysuria, change in color of urine, no urgency or frequency.  No flank pain, no hematuria   MS:  No joint pain or swelling.  No decreased range of motion.  No back pain.    Physical Exam  BP 140/80 (BP Location: Left Arm, Patient Position: Sitting, Cuff Size: Normal)    Pulse (!) 56    Temp (!) 97.3 F (36.3 C)  (Oral)    Ht 5\' 5"  (1.651 m)    Wt 167 lb 12.8 oz (76.1 kg)    LMP  (LMP Unknown)    SpO2 99%    BMI 27.92 kg/m   GEN: A/Ox3; pleasant , NAD, well nourished    HEENT:  /AT,   NOSE-clear, THROAT-clear, no lesions, no postnasal drip or exudate noted.  Class 2-3 MP airway   NECK:  Supple w/ fair ROM; no JVD; normal carotid impulses w/o bruits; no thyromegaly or nodules palpated; no  lymphadenopathy.    RESP  Clear  P & A; w/o, wheezes/ rales/ or rhonchi. no accessory muscle use, no dullness to percussion  CARD:  RRR, no m/r/g, no peripheral edema, pulses intact, no cyanosis or clubbing.  GI:   Soft & nt; nml bowel sounds; no organomegaly or masses detected.   Musco: Warm bil, Right hand cast   Neuro: alert, no focal deficits noted.    Skin: Warm, no lesions or rashes    Lab Results:  CBC     BNP   ProBNP No results found for: PROBNP  Imaging:     No flowsheet data found.  No results found for: NITRICOXIDE      Assessment & Plan:   OSA on CPAP Mild obstructive sleep apnea on most recent home sleep study in 2021.  Patient has a significant associated hypoxemia.  Discussed the importance of CPAP compliance.  Patient education was given.  - discussed how weight can impact sleep and risk for sleep disordered breathing - discussed options to assist with weight loss: combination of diet modification, cardiovascular and strength training exercises   - had an extensive discussion regarding the adverse health consequences related to untreated sleep disordered breathing - specifically discussed the risks for hypertension, coronary artery disease, cardiac dysrhythmias, cerebrovascular disease, and diabetes - lifestyle modification discussed   - discussed how sleep disruption can increase risk of accidents, particularly when driving - safe driving practices were discussed   Plan  Patient Instructions  Restart CPAP at bedtime Try to wear CPAP for at least 4-6 hours  or more. Do not drive if sleepy. Work on healthy weight Follow-up with Drs. Sood in 6 months and as needed        [employer], NP 03/01/2021

## 2021-03-01 NOTE — Assessment & Plan Note (Signed)
Mild obstructive sleep apnea on most recent home sleep study in 2021.  Patient has a significant associated hypoxemia.  Discussed the importance of CPAP compliance.  Patient education was given.  - discussed how weight can impact sleep and risk for sleep disordered breathing - discussed options to assist with weight loss: combination of diet modification, cardiovascular and strength training exercises   - had an extensive discussion regarding the adverse health consequences related to untreated sleep disordered breathing - specifically discussed the risks for hypertension, coronary artery disease, cardiac dysrhythmias, cerebrovascular disease, and diabetes - lifestyle modification discussed   - discussed how sleep disruption can increase risk of accidents, particularly when driving - safe driving practices were discussed   Plan  Patient Instructions  Restart CPAP at bedtime Try to wear CPAP for at least 4-6 hours or more. Do not drive if sleepy. Work on healthy weight Follow-up with Drs. Sood in 6 months and as needed

## 2021-03-01 NOTE — Telephone Encounter (Signed)
[name] calling re: CT results from recent scan  Please call him at [phone]

## 2021-03-01 NOTE — Patient Instructions (Signed)
Restart CPAP at bedtime Try to wear CPAP for at least 4-6 hours or more. Do not drive if sleepy. Work on healthy weight Follow-up with Drs. Sood in 6 months and as needed

## 2021-03-03 NOTE — Progress Notes (Signed)
Reviewed and agree with assessment/plan.   Chesley Mires, MD [facility] Pulmonary/Critical Care 03/03/2021, 11:06 AM Pager:  [phone]

## 2021-03-06 DIAGNOSIS — M79641 Pain in right hand: Secondary | ICD-10-CM | POA: Diagnosis not present

## 2021-03-06 DIAGNOSIS — S62306A Unspecified fracture of fifth metacarpal bone, right hand, initial encounter for closed fracture: Secondary | ICD-10-CM | POA: Diagnosis not present

## 2021-03-06 NOTE — Telephone Encounter (Signed)
Intractable acute post-traumatic headache       Possibly due to repeated concussions, but given on blood thinner (Eliquis) and persistent new headache.. eval with CT head to rule out intracranial bleed or mass.      CT was  to rule out bleed given her headache and falls with head injury on blood thinner to rule out bleed as stated above, but given recurrent falls, memory, dizziness.. may need MRI ( looks at different stuff)in future... at this point though I recommnend referral to neurology for further eval. [place] or [place]?   Also please review the MYchart message sent re: labs with her... is she taking iron?

## 2021-03-06 NOTE — Telephone Encounter (Signed)
Pt and husband called requesting call back for CT results (ordered 12/22 by Dr. Glori Bickers). Please call at [phone].

## 2021-03-06 NOTE — Telephone Encounter (Signed)
Mr. Barreira notified as instructed by telephone.  They are agreeable to neurology referral. Prefers [place].  They did review lab results and she did restart her iron.  He states he was giving it to her qod but I advised to start taking it once daily.  FYI to Dr. Diona Browner.

## 2021-03-06 NOTE — Telephone Encounter (Signed)
Mr. Tindol notified as instructed by telephone.  He states she is still having some dizziness and her heart rate has been running lower that normal. She has fallen 6 times and that was the reason for the MRI.  Mr. Teater is asking what the next steps are?  What do they need to do?

## 2021-03-06 NOTE — Telephone Encounter (Signed)
Call  Normal  No intracranial findings such as intracranial bleeding.  Age appropriate atrophy ( shrinkage of brain)

## 2021-03-07 DIAGNOSIS — S62306A Unspecified fracture of fifth metacarpal bone, right hand, initial encounter for closed fracture: Secondary | ICD-10-CM | POA: Diagnosis not present

## 2021-03-07 DIAGNOSIS — N3946 Mixed incontinence: Secondary | ICD-10-CM | POA: Diagnosis not present

## 2021-03-07 DIAGNOSIS — R350 Frequency of micturition: Secondary | ICD-10-CM | POA: Diagnosis not present

## 2021-03-09 NOTE — Progress Notes (Signed)
Assessment/Plan:    Ataxia MRI brain will be completed.  Neuro exam was nonfocal and nonlateralizing, but exam did demonstrate a bit of ataxia. If above neg, we will look at DaT given description of festination but didn't see that many parkinsonian features (she does have a questionable iodine allergy, but patient does not know the reaction and states that she can eat all shellfish, so likely can just be premedicated). We did discuss testing for SCA but that is on the lower end of the differential and we will wait till the above is completed first. Reasons for falls really seem multifactorial.  Some of them were very reasonable (tripped over a root while walking; missed the step on a ladder), some of them seemed related to atrial fibrillation with rapid ventricular response and dizziness (and symptoms resolved with cardioversion) and others seemed not to have a known cause.  It is unclear if the ones with unknown cause are associated with arrhythmia or not, but she does have a Linq monitor.  2.  Lightheadedness/dizziness  -Likely related to her atrial fibrillation.  Multiple times she has gone to the emergency room with this and found to be in A. fib with rapid ventricular response and required cardioversion.  -her HR was only in the mid 40's in my office, which certainly could contribute.  Contacted pcp who told me to contact cardiology.  Spoke with Dr. Rayann Heman and he is going to review linq.  Pt to make f/u with cardiology.  Appreciation his coordination of care.  Subjective:   [name] was seen today in the movement disorders clinic for neurologic consultation at the request of Bedsole, Amy E, MD.  The consultation is for the evaluation of dizziness and falls.  Medical records made available to me are reviewed.  She states that the falls are related to the dizziness but husband states that her feet get ahead of her body.     Specific Symptoms:  Tremor: Yes.   Family hx of similar:   No. Voice: no significant change Sleep: sleeps well  Vivid Dreams:  No.  Acting out dreams:  No. Wet Pillows: Yes.   Postural symptoms:  Yes.    Falls?  Yes.  Patient has had numerous falls according to the medical records.  Falls date back several years.  ER records indicate patient in the hospital emergency room in October, 2019 after a fall.  This fall was when the patient was in a pumpkin patch with her grandkids and fell and hit her face (she does think that this one was reasonable as she tripped over a root).  Patient in the emergency room in August 2020 after a fall in which she was trying to clean the top of her refrigerator.  She thought that she may have missed a step when she was coming down from the ladder and fell and hit the back of her head (she also thinks she had a reason here since she missed the step).  Patient in the emergency room in May, 2021 after a fall with near syncope.  Patient had a laceration on the back of her head from the fall.  However pt was in afib with RVR in the ER that spontaneously converted.  Had fall about 4-6 weeks ago.  She got dizzy (no palpitations) and fell.  No passing out.  She fractured her metacarpal.  About 2 months ago fell going outside flat on face.  Wasn't dizzy.  About a year ago,  fell in laundry room but ? Syncope that time.  Husband states that with some of the episodes you can look at her and tell she is "in a trance." Bradykinesia symptoms: shuffling gait and slow movements Loss of smell:  No. Loss of taste:  No. Urinary Incontinence:  Yes.   Due to urgency.  Sees [facility].  On myrbetriq Difficulty Swallowing:  No. Handwriting, micrographia: No. Trouble with ADL's:  No.  Trouble buttoning clothing: No. Depression:  No. Memory changes:  Yes.  , husband states is repetitive but still able to drive and able to remember meds.  Husband does pill box.  Pt does finances without trouble.   Hallucinations:  No.  visual distortions:  No. N/V:  No. Lightheaded:  Yes.   Pt in ER previously in sept with lightheadedness, but found to be in A. fib with rapid ventricular response and required cardioversion in the emergency room. Diplopia:  No. Dyskinesia:  No. Prior exposure to reglan/antipsychotics: No.  Her last CT of the brain was done on February 22, 2021.  This was nonacute.  There is atrophy and white matter disease that is at least moderate.  I reviewed this personally.  Patient has not previously had MRI of the brain done  PREVIOUS MEDICATIONS: none to date  ALLERGIES:   Allergies  Allergen Reactions   Cortisone Shortness Of Breath and Rash   Pravachol [Pravastatin Sodium]     Leg pain    Contrast Media [Iodinated Contrast Media] Rash    CURRENT MEDICATIONS:  Current Meds  Medication Sig   atorvastatin (LIPITOR) 10 MG tablet TAKE 1 TABLET BY MOUTH EVERYDAY AT BEDTIME   calcium carbonate (OS-CAL - DOSED IN MG OF ELEMENTAL CALCIUM) 1250 (500 Ca) MG tablet Take 1 tablet by mouth daily with breakfast.   ELIQUIS 5 MG TABS tablet TAKE 1 TABLET BY MOUTH TWICE A DAY   escitalopram (LEXAPRO) 10 MG tablet Take 1 tablet (10 mg total) by mouth daily.   flecainide (TAMBOCOR) 100 MG tablet TAKE 1 TABLET BY MOUTH TWICE A DAY   hydroxypropyl methylcellulose / hypromellose (ISOPTO TEARS / GONIOVISC) 2.5 % ophthalmic solution Place 1 drop into both eyes 2 (two) times daily as needed for dry eyes.   linagliptin (TRADJENTA) 5 MG TABS tablet Take 1 tablet (5 mg total) by mouth daily.   metFORMIN (GLUCOPHAGE-XR) 500 MG 24 hr tablet TAKE 2 TABLETS (1,000 MG TOTAL) BY MOUTH 2 (TWO) TIMES DAILY WITH A MEAL.   metoprolol tartrate (LOPRESSOR) 25 MG tablet Take 1 tablet (25 mg total) by mouth 2 (two) times daily.   MYRBETRIQ 50 MG TB24 tablet Take 50 mg by mouth daily.   ONETOUCH ULTRA test strip CHECK BLOOD SUGAR 2 TIMES A DAY AS DIRECTED DX CODE E11.9   traZODone (DESYREL) 50 MG tablet TAKE 1 TABLET BY MOUTH EVERY DAY AT BEDTIME AS  NEEDED FOR SLEEP   trimethoprim (TRIMPEX) 100 MG tablet Take 100 mg by mouth daily.     Objective:   VITALS:   Vitals:   03/13/21 0934  BP: 135/67  Pulse: (!) 44  SpO2: 97%  Weight: 168 lb (76.2 kg)  Height: 5\' 4"  (1.626 m)    GEN:  The patient appears stated age and is in NAD. HEENT:  Normocephalic, atraumatic.  The mucous membranes are moist. The superficial temporal arteries are without ropiness or tenderness. CV:  brady.  regular Lungs:  CTAB Neck/HEME:  There are no carotid bruits bilaterally.  Neurological examination:  Orientation:  The patient is alert and oriented x3.  Cranial nerves: There is good facial symmetry. No significant facial hypomimia.  Extraocular muscles are intact. The visual fields are full to confrontational testing. The speech is fluent and clear. Soft palate rises symmetrically and there is no tongue deviation. Hearing is intact to conversational tone. Sensation: Sensation is intact to light and pinprick throughout (facial, trunk, extremities). Vibration is intact at the bilateral big toe. There is no extinction with double simultaneous stimulation. There is no sensory dermatomal level identified. Motor: Strength is 5/5 in the bilateral upper and lower extremities.   Shoulder shrug is equal and symmetric.  There is no pronator drift. Deep tendon reflexes: Deep tendon reflexes are 2/4 at the bilateral biceps, triceps, brachioradialis, patella and achilles. Plantar responses are downgoing bilaterally.  Movement examination: Tone: There is ? Mild increased tone in the RUE (vs guarding as she has a splint on it due to metacarpal fx).  Tone elsewhere is nl Abnormal movements: none seen but potentially some is felt Coordination:  There is slowness with RAM's esp on the R but no real decremation with RAM's bilaterally Gait and Station: The patient has some difficulty arising out of a deep-seated chair without the use of the hands.  She slowly falls back into the  chair on the first and second attempt, but is able to arise on the third attempt.  The patient's stride length is good but she is wide-based and somewhat ataxic.  I did not see any shuffling or festination and [place] quite a far way in the office today.   I have reviewed and interpreted the following labs independently   Chemistry      Component Value Date/Time   NA 133 (L) 02/20/2021 1137   NA 137 05/25/2020 1007   K 4.9 02/20/2021 1137   CL 99 02/20/2021 1137   CO2 29 02/20/2021 1137   BUN 15 02/20/2021 1137   BUN 12 05/25/2020 1007   CREATININE 1.00 02/20/2021 1137   CREATININE 0.94 09/24/2013 1610      Component Value Date/Time   CALCIUM 9.7 02/20/2021 1137   ALKPHOS 51 02/20/2021 1137   AST 15 02/20/2021 1137   ALT 9 02/20/2021 1137   BILITOT 0.3 02/20/2021 1137   BILITOT 0.3 11/20/2016 0919      Lab Results  Component Value Date   TSH 2.43 02/20/2021   Lab Results  Component Value Date   WBC 6.3 02/20/2021   HGB 10.8 (L) 02/20/2021   HCT 33.2 (L) 02/20/2021   MCV 88.8 02/20/2021   PLT 301.0 02/20/2021     Total time spent on today's visit was 64minutes, including both face-to-face time and nonface-to-face time.  Time included that spent on review of records (prior notes available to me/labs/imaging if pertinent), discussing treatment and goals, answering patient's questions and coordinating care.  Cc:  Jinny Sanders, MD

## 2021-03-11 LAB — CUP PACEART REMOTE DEVICE CHECK
Date Time Interrogation Session: 20230108231211
Implantable Pulse Generator Implant Date: 20210816

## 2021-03-12 ENCOUNTER — Ambulatory Visit: Payer: Medicare HMO

## 2021-03-13 ENCOUNTER — Ambulatory Visit: Payer: Medicare HMO | Admitting: Neurology

## 2021-03-13 ENCOUNTER — Other Ambulatory Visit: Payer: Self-pay

## 2021-03-13 ENCOUNTER — Encounter: Payer: Self-pay | Admitting: Neurology

## 2021-03-13 VITALS — BP 135/67 | HR 44 | Ht 64.0 in | Wt 168.0 lb

## 2021-03-13 DIAGNOSIS — R270 Ataxia, unspecified: Secondary | ICD-10-CM

## 2021-03-13 DIAGNOSIS — R42 Dizziness and giddiness: Secondary | ICD-10-CM | POA: Diagnosis not present

## 2021-03-13 DIAGNOSIS — R001 Bradycardia, unspecified: Secondary | ICD-10-CM

## 2021-03-13 NOTE — Patient Instructions (Signed)
Schedule an appointment with cardiology to discuss your low heart rate (likely from your metoprolol) A referral to [facility] has been placed for your MRI someone will contact you directly to schedule your appt. They are located at [place]. Please contact them directly by calling [phone] with any questions regarding your referral. If the above mri is negative, we will likely schedule DaT scan, as I discussed with you.  If that is negative, we will do the genetic blood testing as we discussed.

## 2021-03-14 ENCOUNTER — Telehealth: Payer: Self-pay | Admitting: Internal Medicine

## 2021-03-14 NOTE — Telephone Encounter (Signed)
° °  Pre-operative Risk Assessment    Patient Name: [name]  DOB: 08-29-1945 MRN: [id number]      Request for Surgical Clearance    Procedure:   Peripheral nerve evaluation  Date of Surgery:  Clearance 04/20/21                                 Surgeon:  Dr Jordan Hawks Surgeon's Group or Practice Name:  [facility] Phone number:  [phone] ext. [place] Fax number:  [phone]   Type of Clearance Requested:   - Medical  - Pharmacy:  Hold Apixaban (Eliquis)     Type of Anesthesia:   IV sedation   Additional requests/questions:    [name]   03/14/2021, 10:32 AM

## 2021-03-14 NOTE — Telephone Encounter (Signed)
I s/w [name] with Dr. Mikle Bosworth office for clarification for upcoming procedure. I clarified with [name] question the pre op provider has.  There will be no incision Surgeon would still like Eliquis to be held Surgeon still using iv sedation to help keep the pt relaxed The are involved will be the ankle  I assured [name] that I will update the pre op provider. Once we have clearance we will fax notes. I thanked [name] for her help.

## 2021-03-14 NOTE — Telephone Encounter (Signed)
Please clarify the detail of the procedure. Does involve incision? Which peripheral nerve is being evaluated? Arms vs legs?   Typical peripheral nerve evaluation I am aware of does not involve incision, therefore there is no need to hold Eliquis and is done with local anesthetics and does not involve IV sedation. Given the fact, the requesting office want to hold blood thinner and use IV sedation make me suspect more is involved.

## 2021-03-15 ENCOUNTER — Other Ambulatory Visit: Payer: Self-pay

## 2021-03-15 ENCOUNTER — Other Ambulatory Visit (HOSPITAL_COMMUNITY): Payer: Self-pay

## 2021-03-15 ENCOUNTER — Ambulatory Visit (HOSPITAL_COMMUNITY)
Admission: RE | Admit: 2021-03-15 | Discharge: 2021-03-15 | Disposition: A | Discharge status: Home / Self Care | Payer: Medicare HMO | Source: Ambulatory Visit | Attending: Physician Assistant | Admitting: Physician Assistant

## 2021-03-15 VITALS — BP 156/64 | HR 58 | Ht 64.0 in | Wt 168.2 lb

## 2021-03-15 DIAGNOSIS — Z79899 Other long term (current) drug therapy: Secondary | ICD-10-CM | POA: Insufficient documentation

## 2021-03-15 DIAGNOSIS — I4819 Other persistent atrial fibrillation: Secondary | ICD-10-CM | POA: Diagnosis not present

## 2021-03-15 DIAGNOSIS — E118 Type 2 diabetes mellitus with unspecified complications: Secondary | ICD-10-CM | POA: Insufficient documentation

## 2021-03-15 DIAGNOSIS — Z7901 Long term (current) use of anticoagulants: Secondary | ICD-10-CM | POA: Insufficient documentation

## 2021-03-15 DIAGNOSIS — I10 Essential (primary) hypertension: Secondary | ICD-10-CM | POA: Insufficient documentation

## 2021-03-15 DIAGNOSIS — E039 Hypothyroidism, unspecified: Secondary | ICD-10-CM | POA: Diagnosis not present

## 2021-03-15 DIAGNOSIS — R42 Dizziness and giddiness: Secondary | ICD-10-CM | POA: Diagnosis not present

## 2021-03-15 DIAGNOSIS — G4733 Obstructive sleep apnea (adult) (pediatric): Secondary | ICD-10-CM | POA: Insufficient documentation

## 2021-03-15 DIAGNOSIS — D6869 Other thrombophilia: Secondary | ICD-10-CM

## 2021-03-15 MED ORDER — METOPROLOL SUCCINATE ER 25 MG PO TB24
12.5000 mg | ORAL_TABLET | Freq: Every day | ORAL | 3 refills | Status: DC
  Filled 2021-03-15: qty 45, 90d supply, fill #0

## 2021-03-15 NOTE — Telephone Encounter (Signed)
Left message with [facility] service to further clarify the procedure as why the anticoagulation needs to be held for a procedure that does not involve any incision and also peripheral nerve study ordered by urologist to check the ankle(?)

## 2021-03-15 NOTE — Telephone Encounter (Signed)
Heard back from surgery scheduler from Dr. Nicki Reaper McDarmid's office.  The surgery is actually going to be near the patient's sacrum and will be done in the operating room.  A large needle with wires will make a puncture wound into the patient's sacrum and use this to evaluate peripheral nerve.  Therefore the bleeding risk is high in this case.  Will forward to our clinical pharmacist to review

## 2021-03-15 NOTE — Patient Instructions (Signed)
Stop metoprolol tartrate (lopressor)  Start metoprolol succinate 12.5mg  once a day at bedtime (1/2 of the 25mg  tablet)

## 2021-03-15 NOTE — Progress Notes (Signed)
Primary Care Physician: Jinny Sanders, MD Primary Cardiologist: Dr Fletcher Anon Primary Electrophysiologist: Dr Rayann Heman Referring Physician: Dr Rayann Heman   [name] is a 76 y.o. female with a history of anemia, DM, HTN, hypothyroidism, OSA, MR, atrial fibrillation who presents for follow up in the [facility]. Patient is on Eliquis for a CHADS2VASC score of 4. She was seen at the ED 11/21/20 for afib with RVR and underwent DCCV at that time. Dizziness is typically associated with her afib in the past although she does have a history of vertigo. Patient has been having more issues with dizziness and falls recently. She has seen Dr Tat of neurology and brain MRI is pending. Her heart rate at that visit was 44 bpm. She stopped her metoprolol on her own yesterday.   On follow up today, she does feel less dizzy off metoprolol. She is in SR. ILR shows no afib or pauses to explain her symptoms. She denies any bleeding issues on anticoagulation.   Today, she denies symptoms of palpitations, chest pain, shortness of breath, orthopnea, PND, lower extremity edema, presyncope, syncope, snoring, daytime somnolence, bleeding, or neurologic sequela. The patient is tolerating medications without difficulties and is otherwise without complaint today.    Atrial Fibrillation Risk Factors:  she does have symptoms or diagnosis of sleep apnea. she is compliant with CPAP therapy. she does not have a history of rheumatic fever.   she has a BMI of Body mass index is 28.87 kg/m.[name] Filed Weights   03/15/21 1111  Weight: 76.3 kg     Family History  Problem Relation Age of Onset   Cancer Mother        fallopian tube   Dementia Mother    Pulmonary embolism Mother    Hyperlipidemia Mother    Diabetes Mother    Hyperlipidemia Father    Lung cancer Father    Hypertension Sister    Diabetes Brother    Cancer Brother    Stroke Paternal Grandmother    Stroke Paternal Grandfather     Kidney cancer Son    Dementia Maternal Aunt    Colon cancer Neg Hx    Esophageal cancer Neg Hx      Atrial Fibrillation Management history:  Previous antiarrhythmic drugs: flecainide Previous cardioversions: 2018 x 2, 2020, 2021 x 2, 11/21/20 Previous ablations: 12/17/16 CHADS2VASC score: 4 Anticoagulation history: Eliquis   Past Medical History:  Diagnosis Date   Anemia    Anxiety    Atrial fibrillation ([place])    Colon polyps    Constipation    Depressive disorder, not elsewhere classified    Diabetes mellitus without complication ([place])    Diaphragmatic hernia without mention of obstruction or gangrene    Disorders of bursae and tendons in shoulder region, unspecified    Diverticulosis of colon (without mention of hemorrhage)    External hemorrhoid    Fundic gland polyps of stomach, benign    GERD (gastroesophageal reflux disease)    History of hiatal hernia    HTN (hypertension)    Hypertension    Hypothyroidism    Internal hemorrhoids without mention of complication    Moderate mitral regurgitation    Obesity, unspecified    OSA on CPAP    compliant with CPAP   Other urinary incontinence    Persistent atrial fibrillation ([place])    Pure hypercholesterolemia    Type II diabetes mellitus ([place])    Urinary frequency    Urinary urgency  Vitamin B 12 deficiency    Past Surgical History:  Procedure Laterality Date   ABDOMINAL HYSTERECTOMY     ABDOMINAL HYSTERECTOMY  1972   Have both ovaries   ATRIAL FIBRILLATION ABLATION  12/17/2016   ATRIAL FIBRILLATION ABLATION N/A 12/17/2016   Procedure: ATRIAL FIBRILLATION ABLATION;  Surgeon: Thompson Grayer, MD;  Location: [facility];  Service: Cardiovascular;  Laterality: N/A;   Between   MC; "Dr. [name]"   CARDIOVERSION N/A 05/27/2016   Procedure: CARDIOVERSION;  Surgeon: [name], MD;  Location: [facility];  Service: Cardiovascular;  Laterality: N/A;   CARDIOVERSION N/A 11/25/2016    Procedure: CARDIOVERSION;  Surgeon: [name], MD;  Location: [facility];  Service: Cardiovascular;  Laterality: N/A;   CARDIOVERSION N/A 09/18/2018   Procedure: CARDIOVERSION;  Surgeon: [name], MD;  Location: [facility];  Service: Cardiovascular;  Laterality: N/A;   CARDIOVERSION N/A 07/28/2019   Procedure: CARDIOVERSION;  Surgeon: Kate Sable, MD;  Location: [facility];  Service: Cardiovascular;  Laterality: N/A;   CARDIOVERSION N/A 09/07/2019   Procedure: CARDIOVERSION;  Surgeon: Nelva Bush, MD;  Location: [facility];  Service: Cardiovascular;  Laterality: N/A;   CATARACT EXTRACTION W/ INTRAOCULAR LENS  IMPLANT, BILATERAL Bilateral    COLONOSCOPY     ELECTROPHYSIOLOGIC STUDY N/A 01/24/2016   Procedure: CARDIOVERSION;  Surgeon: [name], MD;  Location: [facility];  Service: Cardiovascular;  Laterality: N/A;   implantable loop recorder implantation  10/18/2019   Medtronic Reveal [place] model [id number] G) implantable loop recorder    LAPAROSCOPIC CHOLECYSTECTOMY     REDUCTION MAMMAPLASTY Bilateral    TONSILLECTOMY     TOTAL HIP ARTHROPLASTY Right 05/12/2018   Procedure: TOTAL HIP ARTHROPLASTY ANTERIOR APPROACH;  Surgeon: Paralee Cancel, MD;  Location: [facility];  Service: Orthopedics;  Laterality: Right;  70 mins    Current Outpatient Medications  Medication Sig Dispense Refill   Ascorbic Acid (VITAMIN C PO) Take 1 tablet by mouth at bedtime.     atorvastatin (LIPITOR) 10 MG tablet TAKE 1 TABLET BY MOUTH EVERYDAY AT BEDTIME 90 tablet 1   calcium carbonate (OS-CAL - DOSED IN MG OF ELEMENTAL CALCIUM) 1250 (500 Ca) MG tablet Take 1 tablet by mouth daily with breakfast.     ELIQUIS 5 MG TABS tablet TAKE 1 TABLET BY MOUTH TWICE A DAY 60 tablet 9   escitalopram (LEXAPRO) 10 MG tablet Take 1 tablet (10 mg total) by mouth daily. 90 tablet 1   flecainide (TAMBOCOR) 100 MG tablet TAKE 1 TABLET BY MOUTH TWICE A DAY 180 tablet 0   hydroxypropyl methylcellulose / hypromellose  (ISOPTO TEARS / GONIOVISC) 2.5 % ophthalmic solution Place 1 drop into both eyes 2 (two) times daily as needed for dry eyes.     linagliptin (TRADJENTA) 5 MG TABS tablet Take 1 tablet (5 mg total) by mouth daily. 90 tablet 3   metFORMIN (GLUCOPHAGE-XR) 500 MG 24 hr tablet TAKE 2 TABLETS (1,000 MG TOTAL) BY MOUTH 2 (TWO) TIMES DAILY WITH A MEAL. 360 tablet 1   MYRBETRIQ 50 MG TB24 tablet Take 50 mg by mouth daily.     ONETOUCH ULTRA test strip CHECK BLOOD SUGAR 2 TIMES A DAY AS DIRECTED DX CODE E11.9 100 strip 7   traZODone (DESYREL) 50 MG tablet TAKE 1 TABLET BY MOUTH EVERY DAY AT BEDTIME AS NEEDED FOR SLEEP 30 tablet 5   trimethoprim (TRIMPEX) 100 MG tablet Take 100 mg by mouth daily.     VITAMIN D  PO Take 5,000 mg by mouth daily in the afternoon.     metoprolol tartrate (LOPRESSOR) 25 MG tablet Take 1 tablet (25 mg total) by mouth 2 (two) times daily. (Patient not taking: Reported on 03/15/2021) 180 tablet 1   No current facility-administered medications for this encounter.    Allergies  Allergen Reactions   Cortisone Shortness Of Breath and Rash   Pravachol [Pravastatin Sodium]     Leg pain    Contrast Media [Iodinated Contrast Media] Rash    Social History   Socioeconomic History   Marital status: Married    Spouse name: Jimmy   Number of children: 2   Years of education: Not on file   Highest education level: Not on file  Occupational History   Not on file  Tobacco Use   Smoking status: Never   Smokeless tobacco: Never  Vaping Use   Vaping Use: Never used  Substance and Sexual Activity   Alcohol use: No    Alcohol/week: 0.0 standard drinks   Drug use: No   Sexual activity: Yes  Other Topics Concern   Not on file  Social History Narrative   ** Merged History Encounter **       Exercise walks 3 times daily Lives with spouse in [place] - retired from employment 1 son and 1 daughter  Diet fruit, occ fast food, salads and lean meat  3 tea/day, no  EtOH, no tobacco, no drugs    Social Determinants of Radio broadcast assistant Strain: Low Risk    Difficulty of Paying Living Expenses: Not very hard  Food Insecurity: No Food Insecurity   Worried About Charity fundraiser in the Last Year: Never true   Ran Out of Food in the Last Year: Never true  Transportation Needs: No Transportation Needs   Lack of Transportation (Medical): No   Lack of Transportation (Non-Medical): No  Physical Activity: Insufficiently Active   Days of Exercise per Week: 3 days   Minutes of Exercise per Session: 40 min  Stress: No Stress Concern Present   Feeling of Stress : Not at all  Social Connections: Not on file  Intimate Partner Violence: Not At Risk   Fear of Current or Ex-Partner: No   Emotionally Abused: No   Physically Abused: No   Sexually Abused: No     ROS- All systems are reviewed and negative except as per the HPI above.  Physical Exam: Vitals:   03/15/21 1111  BP: (!) 156/64  Pulse: (!) 58  Weight: 76.3 kg  Height: 5\' 4"  (1.626 m)    GEN- The patient is a well appearing female, alert and oriented x 3 today.   HEENT-head normocephalic, atraumatic, sclera clear, conjunctiva pink, hearing intact, trachea midline. Lungs- Clear to ausculation bilaterally, normal work of breathing Heart- Regular rate and rhythm, no murmurs, rubs or gallops  GI- soft, NT, ND, + BS Extremities- no clubbing, cyanosis, or edema MS- no significant deformity or atrophy Skin- no rash or lesion Psych- euthymic mood, full affect Neuro- strength and sensation are intact   Wt Readings from Last 3 Encounters:  03/15/21 76.3 kg  03/13/21 76.2 kg  03/01/21 76.1 kg    EKG today demonstrates  SB, 1st degree AV block Vent. rate 58 BPM PR interval 238 ms QRS duration 96 ms QT/QTcB 430/422 ms  Echo 04/24/20 demonstrated   1. Left ventricular ejection fraction, by estimation, is 55 to 60%. The  left ventricle has normal function. The  left ventricle has  no regional  wall motion abnormalities. The left ventricular internal cavity size was  mildly dilated. Left ventricular diastolic parameters are indeterminate.   2. Right ventricular systolic function is normal. The right ventricular  size is normal. There is moderately elevated pulmonary artery systolic  pressure. The estimated right ventricular systolic pressure is 62.8 mmHg.   3. Left atrial size was severely dilated.   4. The mitral valve is normal in structure. Moderate mitral valve  regurgitation. No evidence of mitral stenosis.   5. The aortic valve is normal in structure. Aortic valve regurgitation is  not visualized. Mild aortic valve sclerosis is present, with no evidence  of aortic valve stenosis.   6. The inferior vena cava is normal in size with greater than 50%  respiratory variability, suggesting right atrial pressure of 3 mmHg.   Comparison(s): Prior Echo showed LV EF 55-60%, severe LAE with eccentric MR.   Epic records are reviewed at length today  CHA2DS2-VASc Score = 4  The patient's score is based upon: CHF History: 0 HTN History: 1 Diabetes History: 1 Stroke History: 0 Vascular Disease History: 0 Age Score: 1 Gender Score: 1       ASSESSMENT AND PLAN: 1. Persistent Atrial Fibrillation (ICD10:  I48.19) The patient's CHA2DS2-VASc score is 4, indicating a 4.8% annual risk of stroke.   Patient maintaining SR on flecainide. We discussed rhythm control options. Specifically, we discussed the need for AV nodal agents on flecainide to prevent rapid 1:1 conduction.  Will try Toprol 12.5 mg daily at bedtime. If she cannot tolerate this, will consider changing to dofetilide since this does not require AV nodal agent.  Continue Eliquis 5 mg BID Continue flecainide 100 mg BID  2. Secondary Hypercoagulable State (ICD10:  D68.69) The patient is at significant risk for stroke/thromboembolism based upon her CHA2DS2-VASc Score of 4.  Continue Apixaban (Eliquis).   3.  OSA Encouraged compliance with CPAP therapy.  4. HTN Elevated today, start BB as above.   5. MR Moderate MR   Follow up in the AF clinic in 2 weeks.    [facility] [address] [place], [place] [address] [phone]

## 2021-03-16 NOTE — Telephone Encounter (Signed)
Patient with diagnosis of atrial fibrillation on Eliquis for anticoagulation.    Procedure: peripheral nerve evaluation Date of procedure: 04/20/21   CHA2DS2-VASc Score = 4   This indicates a 4.8% annual risk of stroke. The patient's score is based upon: CHF History: 0 HTN History: 1 Diabetes History: 1 Stroke History: 0 Vascular Disease History: 0 Age Score: 1 Gender Score: 1  CrCl 50  Platelet count 301  Per office protocol, patient can hold Eliquis for 3 days prior to procedure.   Patient will not need bridging with Lovenox (enoxaparin) around procedure.

## 2021-03-19 ENCOUNTER — Telehealth: Payer: Self-pay | Admitting: Pulmonary Disease

## 2021-03-19 ENCOUNTER — Telehealth: Payer: Self-pay | Admitting: Physician Assistant

## 2021-03-19 ENCOUNTER — Other Ambulatory Visit: Payer: Self-pay | Admitting: Family Medicine

## 2021-03-19 MED ORDER — FLECAINIDE ACETATE 100 MG PO TABS: 100.0000 mg | ORAL_TABLET | Freq: Two times a day (BID) | ORAL | 1 refills | Status: DC

## 2021-03-19 MED ORDER — TRAZODONE HCL 50 MG PO TABS: 50.0000 mg | ORAL_TABLET | Freq: Every evening | ORAL | 5 refills | Status: DC | PRN

## 2021-03-19 NOTE — Telephone Encounter (Signed)
° °  Primary Cardiologist: Kathlyn Sacramento, MD  Chart reviewed as part of pre-operative protocol coverage. Given past medical history and time since last visit, based on ACC/AHA guidelines, [name] would be at acceptable risk for the planned procedure without further cardiovascular testing.   Patient with diagnosis of atrial fibrillation on Eliquis for anticoagulation.     Procedure: peripheral nerve evaluation Date of procedure: 04/20/21     CHA2DS2-VASc Score = 4   This indicates a 4.8% annual risk of stroke. The patient's score is based upon: CHF History: 0 HTN History: 1 Diabetes History: 1 Stroke History: 0 Vascular Disease History: 0 Age Score: 1 Gender Score: 1   CrCl 50             Platelet count 301   Per office protocol, patient can hold Eliquis for 3 days prior to procedure.   Patient will not need bridging with Lovenox (enoxaparin) around procedure.  I will route this recommendation to the requesting party via Epic fax function and remove from pre-op pool.  Please call with questions.  Jossie Ng. Audrielle Vankuren NP-C    03/19/2021, 8:05 AM [address] Office [phone] Fax [phone]

## 2021-03-19 NOTE — Telephone Encounter (Signed)
Routing to Dr. Halford Chessman for him to take care of for pt as pt's pharmacy is the one who called [name] about this.

## 2021-03-19 NOTE — Telephone Encounter (Signed)
A Fib being managed by Dr. Rayann Heman. Please review for refill. Thank you! Patient has been advised to get refill of trazodone through PCP.

## 2021-03-19 NOTE — Telephone Encounter (Signed)
Refill sent.

## 2021-03-19 NOTE — Telephone Encounter (Signed)
°*  STAT* If patient is at the pharmacy, call can be transferred to refill team.   1. Which medications need to be refilled? (please list name of each medication and dose if known) Flecainide 100mg  1 tablet twice a day, Trazodone 50 mg 1 tablet daily  2. Which pharmacy/location (including street and city if local pharmacy) is medication to be sent to? [facility]  3. Do they need a 30 day or 90 day supply? 90 day

## 2021-03-19 NOTE — Telephone Encounter (Signed)
This is a A-Fib clinic pt 

## 2021-03-19 NOTE — Telephone Encounter (Signed)
Called and spoke with pt letting her know that [facility] refilled her Trazodone and she verbalized understanding. Nothing further needed.

## 2021-03-19 NOTE — Telephone Encounter (Signed)
Flecainide refill sent

## 2021-03-20 DIAGNOSIS — S62306A Unspecified fracture of fifth metacarpal bone, right hand, initial encounter for closed fracture: Secondary | ICD-10-CM | POA: Diagnosis not present

## 2021-03-20 DIAGNOSIS — S62306D Unspecified fracture of fifth metacarpal bone, right hand, subsequent encounter for fracture with routine healing: Secondary | ICD-10-CM | POA: Diagnosis not present

## 2021-03-23 ENCOUNTER — Ambulatory Visit: Payer: Medicare HMO | Admitting: Internal Medicine

## 2021-03-23 ENCOUNTER — Other Ambulatory Visit: Payer: Self-pay

## 2021-03-23 ENCOUNTER — Encounter: Payer: Self-pay | Admitting: Internal Medicine

## 2021-03-23 ENCOUNTER — Ambulatory Visit (INDEPENDENT_AMBULATORY_CARE_PROVIDER_SITE_OTHER): Payer: Medicare HMO

## 2021-03-23 ENCOUNTER — Telehealth (HOSPITAL_COMMUNITY): Payer: Self-pay | Admitting: *Deleted

## 2021-03-23 VITALS — BP 126/66 | HR 48 | Ht 64.0 in | Wt 166.8 lb

## 2021-03-23 VITALS — BP 138/70 | HR 59 | Ht 64.0 in | Wt 167.0 lb

## 2021-03-23 DIAGNOSIS — I4819 Other persistent atrial fibrillation: Secondary | ICD-10-CM

## 2021-03-23 DIAGNOSIS — E7800 Pure hypercholesterolemia, unspecified: Secondary | ICD-10-CM

## 2021-03-23 DIAGNOSIS — E063 Autoimmune thyroiditis: Secondary | ICD-10-CM | POA: Diagnosis not present

## 2021-03-23 DIAGNOSIS — R001 Bradycardia, unspecified: Secondary | ICD-10-CM | POA: Diagnosis not present

## 2021-03-23 DIAGNOSIS — E119 Type 2 diabetes mellitus without complications: Secondary | ICD-10-CM

## 2021-03-23 DIAGNOSIS — I340 Nonrheumatic mitral (valve) insufficiency: Secondary | ICD-10-CM | POA: Diagnosis not present

## 2021-03-23 LAB — POCT GLYCOSYLATED HEMOGLOBIN (HGB A1C): Hemoglobin A1C: 6.9 % — AB (ref 4.0–5.6)

## 2021-03-23 NOTE — Patient Instructions (Addendum)
Please continue: - Metformin ER 1000 mg 2x a day - Tradjenta  5 mg before b'fast  Please return in 4 months with your sugar log.

## 2021-03-23 NOTE — Telephone Encounter (Signed)
Patient husband called in stating patient continues to have dizziness with HRs in the 40s despite metoprolol formulary change. Discussed with Adline Peals PA - spoke with Dr. Rayann Heman - recommends consult with [facility] to discuss probable pacer for tachy/brady. Appt made for today to further discuss best option. Pt husband in agreement.

## 2021-03-23 NOTE — Progress Notes (Unsigned)
Enrolled patient for a 14 day Zio XT  monitor to be mailed to patients home  °
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
## 2021-03-23 NOTE — Patient Instructions (Addendum)
Medication Instructions:  Your physician recommends that you continue on your current medications as directed. Please refer to the Current Medication list given to you today.  Labwork: None ordered.  Testing/Procedures: Your physician has recommended that you wear a holter monitor. Holter monitors are medical devices that record the hearts electrical activity. Doctors most often use these monitors to diagnose arrhythmias. Arrhythmias are problems with the speed or rhythm of the heartbeat. The monitor is a small, portable device. You can wear one while you do your normal daily activities. This is usually used to diagnose what is causing palpitations/syncope (passing out).  You will wear a 14 day ZIO monitor  Follow-Up: Your physician wants you to follow-up based on results of your heart monitor  Any Other Special Instructions Will Be Listed Below (If Applicable).  If you need a refill on your cardiac medications before your next appointment, please call your pharmacy.   Your physician has recommended that you wear a Zio monitor.   This monitor is a medical device that records the hearts electrical activity. Doctors most often use these monitors to diagnose arrhythmias. Arrhythmias are problems with the speed or rhythm of the heartbeat. The monitor is a small device applied to your chest. You can wear one while you do your normal daily activities. While wearing this monitor if you have any symptoms to push the button and record what you felt. Once you have worn this monitor for the period of time provider prescribed (Usually 14 days), you will return the monitor device in the postage paid box. Once it is returned they will download the data collected and provide [name] with a report which the provider will then review and we will call you with those results. Important tips:  Avoid showering during the first 24 hours of wearing the monitor. Avoid excessive sweating to help maximize wear time. Do  not submerge the device, no hot tubs, and no swimming pools. Keep any lotions or oils away from the patch. After 24 hours you may shower with the patch on. Take brief showers with your back facing the shower head.  Do not remove patch once it has been placed because that will interrupt data and decrease adhesive wear time. Push the button when you have any symptoms and write down what you were feeling. Once you have completed wearing your monitor, remove and place into box which has postage paid and place in your outgoing mailbox.  If for some reason you have misplaced your box then call our office and we can provide another box and/or mail it off for you.

## 2021-03-23 NOTE — Progress Notes (Signed)
HPI [name] returns today for followup. She is a pleasant 76 yo woman with a h/o PAF on flecainide. She has a h/o mild sinus node dysfunction. She has had trouble with falls without frank syncope. She has had a couple of medical visits where her HR is in the 40's. She appears to be maintaining NSR 99% of the time.  Allergies  Allergen Reactions   Cortisone Shortness Of Breath and Rash   Pravachol [Pravastatin Sodium]     Leg pain    Contrast Media [Iodinated Contrast Media] Rash     Current Outpatient Medications  Medication Sig Dispense Refill   Ascorbic Acid (VITAMIN C PO) Take 1 tablet by mouth at bedtime.     atorvastatin (LIPITOR) 10 MG tablet TAKE ONE TABLET BY MOUTH AT BEDTIME 90 tablet 1   calcium carbonate (OS-CAL - DOSED IN MG OF ELEMENTAL CALCIUM) 1250 (500 Ca) MG tablet Take 1 tablet by mouth daily with breakfast.     ELIQUIS 5 MG TABS tablet TAKE 1 TABLET BY MOUTH TWICE A DAY 60 tablet 9   escitalopram (LEXAPRO) 10 MG tablet Take 1 tablet (10 mg total) by mouth daily. 90 tablet 1   flecainide (TAMBOCOR) 100 MG tablet Take 1 tablet (100 mg total) by mouth 2 (two) times daily. 180 tablet 1   hydroxypropyl methylcellulose / hypromellose (ISOPTO TEARS / GONIOVISC) 2.5 % ophthalmic solution Place 1 drop into both eyes 2 (two) times daily as needed for dry eyes.     linagliptin (TRADJENTA) 5 MG TABS tablet Take 1 tablet (5 mg total) by mouth daily. 90 tablet 3   metFORMIN (GLUCOPHAGE-XR) 500 MG 24 hr tablet TAKE 2 TABLETS (1,000 MG TOTAL) BY MOUTH 2 (TWO) TIMES DAILY WITH A MEAL. 360 tablet 1   metoprolol succinate (TOPROL XL) 25 MG 24 hr tablet Take 0.5 tablets (12.5 mg total) by mouth at bedtime. 30 tablet 3   MYRBETRIQ 50 MG TB24 tablet Take 50 mg by mouth daily.     ONETOUCH ULTRA test strip CHECK BLOOD SUGAR 2 TIMES A DAY AS DIRECTED DX CODE E11.9 100 strip 7   traZODone (DESYREL) 50 MG tablet Take 1 tablet (50 mg total) by mouth at bedtime as needed for sleep. 30  tablet 5   trimethoprim (TRIMPEX) 100 MG tablet Take 100 mg by mouth daily.     VITAMIN D PO Take 5,000 mg by mouth daily in the afternoon.     No current facility-administered medications for this visit.     Past Medical History:  Diagnosis Date   Anemia    Anxiety    Atrial fibrillation (HCC)    Colon polyps    Constipation    Depressive disorder, not elsewhere classified    Diabetes mellitus without complication ([place])    Diaphragmatic hernia without mention of obstruction or gangrene    Disorders of bursae and tendons in shoulder region, unspecified    Diverticulosis of colon (without mention of hemorrhage)    External hemorrhoid    Fundic gland polyps of stomach, benign    GERD (gastroesophageal reflux disease)    History of hiatal hernia    HTN (hypertension)    Hypertension    Hypothyroidism    Internal hemorrhoids without mention of complication    Moderate mitral regurgitation    Obesity, unspecified    OSA on CPAP    compliant with CPAP   Other urinary incontinence    Persistent atrial fibrillation ([place])  Pure hypercholesterolemia    Type II diabetes mellitus (HCC)    Urinary frequency    Urinary urgency    Vitamin B 12 deficiency     ROS:   All systems reviewed and negative except as noted in the HPI.   Past Surgical History:  Procedure Laterality Date   ABDOMINAL HYSTERECTOMY     ABDOMINAL HYSTERECTOMY  1972   Have both ovaries   ATRIAL FIBRILLATION ABLATION  12/17/2016   ATRIAL FIBRILLATION ABLATION N/A 12/17/2016   Procedure: ATRIAL FIBRILLATION ABLATION;  Surgeon: Thompson Grayer, MD;  Location: [facility];  Service: Cardiovascular;  Laterality: N/A;   [place]   [facility]; "Dr. [name]"   CARDIOVERSION N/A 05/27/2016   Procedure: CARDIOVERSION;  Surgeon: [name], MD;  Location: [facility];  Service: Cardiovascular;  Laterality: N/A;   CARDIOVERSION N/A 11/25/2016   Procedure: CARDIOVERSION;  Surgeon: [name], MD;  Location: [facility];  Service: Cardiovascular;  Laterality: N/A;   CARDIOVERSION N/A 09/18/2018   Procedure: CARDIOVERSION;  Surgeon: [name], MD;  Location: [facility];  Service: Cardiovascular;  Laterality: N/A;   CARDIOVERSION N/A 07/28/2019   Procedure: CARDIOVERSION;  Surgeon: Kate Sable, MD;  Location: [facility];  Service: Cardiovascular;  Laterality: N/A;   CARDIOVERSION N/A 09/07/2019   Procedure: CARDIOVERSION;  Surgeon: Nelva Bush, MD;  Location: [facility];  Service: Cardiovascular;  Laterality: N/A;   CATARACT EXTRACTION W/ INTRAOCULAR LENS  IMPLANT, BILATERAL Bilateral    COLONOSCOPY     ELECTROPHYSIOLOGIC STUDY N/A 01/24/2016   Procedure: CARDIOVERSION;  Surgeon: [name], MD;  Location: [facility];  Service: Cardiovascular;  Laterality: N/A;   implantable loop recorder implantation  10/18/2019   [employer]onic Reveal [place] model [id number]) implantable loop recorder    LAPAROSCOPIC CHOLECYSTECTOMY     REDUCTION MAMMAPLASTY Bilateral    TONSILLECTOMY     TOTAL HIP ARTHROPLASTY Right 05/12/2018   Procedure: TOTAL HIP ARTHROPLASTY ANTERIOR APPROACH;  Surgeon: Paralee Cancel, MD;  Location: [facility];  Service: Orthopedics;  Laterality: Right;  56 mins     Family History  Problem Relation Age of Onset   Cancer Mother        fallopian tube   Dementia Mother    Pulmonary embolism Mother    Hyperlipidemia Mother    Diabetes Mother    Hyperlipidemia Father    Lung cancer Father    Hypertension Sister    Diabetes Brother    Cancer Brother    Stroke Paternal Grandmother    Stroke Paternal Grandfather    Kidney cancer Son    Dementia Maternal Aunt    Colon cancer Neg Hx    Esophageal cancer Neg Hx      Social History   Socioeconomic History   Marital status: Married    Spouse name: [name]   Number of children: 2   Years of education: Not on file   Highest education level: Not on file  Occupational History   Not on file  Tobacco Use    Smoking status: Never   Smokeless tobacco: Never  Vaping Use   Vaping Use: Never used  Substance and Sexual Activity   Alcohol use: No    Alcohol/week: 0.0 standard drinks   Drug use: No   Sexual activity: Yes  Other Topics Concern   Not on file  Social History Narrative   ** Merged History Encounter **       Exercise walks 3 times daily Lives with  spouse in [employer] - retired from employment 1 son and 1 daughter  Diet fruit, occ fast food, salads and lean meat  3 tea/day, no EtOH, no tobacco, no drugs    Social Determinants of Radio broadcast assistant Strain: Low Risk    Difficulty of Paying Living Expenses: Not very hard  Food Insecurity: No Food Insecurity   Worried About Charity fundraiser in the Last Year: Never true   Ran Out of Food in the Last Year: Never true  Transportation Needs: No Transportation Needs   Lack of Transportation (Medical): No   Lack of Transportation (Non-Medical): No  Physical Activity: Insufficiently Active   Days of Exercise per Week: 3 days   Minutes of Exercise per Session: 40 min  Stress: No Stress Concern Present   Feeling of Stress : Not at all  Social Connections: Not on file  Intimate Partner Violence: Not At Risk   Fear of Current or Ex-Partner: No   Emotionally Abused: No   Physically Abused: No   Sexually Abused: No     BP 138/70    Pulse (!) 59    Ht 5\' 4"  (1.626 m)    Wt 167 lb (75.8 kg)    LMP  (LMP Unknown)    SpO2 97%    BMI 28.67 kg/m   Physical Exam:  Well appearing NAD HEENT: Unremarkable Neck:  No JVD, no thyromegally Lymphatics:  No adenopathy Back:  No CVA tenderness Lungs:  Clear with no wheezes HEART:  Regular rate rhythm, no murmurs, no rubs, no clicks Abd:  soft, positive bowel sounds, no organomegally, no rebound, no guarding Ext:  2 plus pulses, no edema, no cyanosis, no clubbing Skin:  No rashes no nodules Neuro:  CN II through XII intact, motor grossly intact  DEVICE  Normal  device function.  See PaceArt for details.   Assess/Plan:  PAF - she is maintaining NSR. She will continue her flecainide. No indication for PPM insertion. Spells of loss of postural tone - it is unclear as to the etiology of the spells. She will wear a 2 week zio monitor and we will try and correlate her spells to her HR. HTN - her bp is fairly well controlled. We will follow. Coags - she has not had any bleeding. No change in her meds.  Carleene Overlie Bransen [place],MD

## 2021-03-24 ENCOUNTER — Emergency Department (HOSPITAL_COMMUNITY): Payer: Medicare HMO

## 2021-03-24 ENCOUNTER — Other Ambulatory Visit: Payer: Self-pay

## 2021-03-24 ENCOUNTER — Encounter (HOSPITAL_COMMUNITY): Payer: Self-pay

## 2021-03-24 ENCOUNTER — Emergency Department (HOSPITAL_COMMUNITY)
Admission: EM | Admit: 2021-03-24 | Discharge: 2021-03-25 | Disposition: A | Discharge status: Home / Self Care | Payer: Medicare HMO | Attending: Emergency Medicine | Admitting: Emergency Medicine

## 2021-03-24 ENCOUNTER — Telehealth: Payer: Self-pay | Admitting: Physician Assistant

## 2021-03-24 DIAGNOSIS — Z7901 Long term (current) use of anticoagulants: Secondary | ICD-10-CM | POA: Insufficient documentation

## 2021-03-24 DIAGNOSIS — I10 Essential (primary) hypertension: Secondary | ICD-10-CM | POA: Insufficient documentation

## 2021-03-24 DIAGNOSIS — E039 Hypothyroidism, unspecified: Secondary | ICD-10-CM | POA: Insufficient documentation

## 2021-03-24 DIAGNOSIS — E119 Type 2 diabetes mellitus without complications: Secondary | ICD-10-CM | POA: Insufficient documentation

## 2021-03-24 DIAGNOSIS — Z79899 Other long term (current) drug therapy: Secondary | ICD-10-CM | POA: Insufficient documentation

## 2021-03-24 DIAGNOSIS — I4891 Unspecified atrial fibrillation: Secondary | ICD-10-CM | POA: Insufficient documentation

## 2021-03-24 DIAGNOSIS — Z20822 Contact with and (suspected) exposure to covid-19: Secondary | ICD-10-CM | POA: Diagnosis not present

## 2021-03-24 DIAGNOSIS — R079 Chest pain, unspecified: Secondary | ICD-10-CM | POA: Diagnosis not present

## 2021-03-24 LAB — BASIC METABOLIC PANEL
Anion gap: 9 (ref 5–15)
BUN: 12 mg/dL (ref 8–23)
CO2: 27 mmol/L (ref 22–32)
Calcium: 9.3 mg/dL (ref 8.9–10.3)
Chloride: 95 mmol/L — ABNORMAL LOW (ref 98–111)
Creatinine, Ser: 1.19 mg/dL — ABNORMAL HIGH (ref 0.44–1.00)
GFR, Estimated: 48 mL/min — ABNORMAL LOW (ref >60)
Glucose, Bld: 166 mg/dL — ABNORMAL HIGH (ref 70–99)
Potassium: 4.4 mmol/L (ref 3.5–5.1)
Sodium: 131 mmol/L — ABNORMAL LOW (ref 135–145)

## 2021-03-24 LAB — CBC
HCT: 38 % (ref 36.0–46.0)
Hemoglobin: 12 g/dL (ref 12.0–15.0)
MCH: 28.3 pg (ref 26.0–34.0)
MCHC: 31.6 g/dL (ref 30.0–36.0)
MCV: 89.6 fL (ref 80.0–100.0)
Platelets: 323 10*3/uL (ref 150–400)
RBC: 4.24 MIL/uL (ref 3.87–5.11)
RDW: 13 % (ref 11.5–15.5)
WBC: 7.8 10*3/uL (ref 4.0–10.5)
nRBC: 0 % (ref 0.0–0.2)

## 2021-03-24 LAB — TROPONIN I (HIGH SENSITIVITY)
Troponin I (High Sensitivity): 6 ng/L (ref <18)
Troponin I (High Sensitivity): 6 ng/L (ref <18)

## 2021-03-24 LAB — MAGNESIUM: Magnesium: 1.7 mg/dL (ref 1.7–2.4)

## 2021-03-24 LAB — RESP PANEL BY RT-PCR (FLU A&B, COVID) ARPGX2
Influenza A by PCR: NEGATIVE
Influenza B by PCR: NEGATIVE
SARS Coronavirus 2 by RT PCR: NEGATIVE

## 2021-03-24 MED ORDER — ETOMIDATE 2 MG/ML IV SOLN
INTRAVENOUS | Status: AC
  Administered 2021-03-24: 10 mg via INTRAVENOUS
  Filled 2021-03-24: qty 10

## 2021-03-24 MED ORDER — ETOMIDATE 2 MG/ML IV SOLN: 10.0000 mg | Freq: Once | INTRAVENOUS | Status: AC

## 2021-03-24 MED ORDER — ETOMIDATE 2 MG/ML IV SOLN: 10.0000 mg | Freq: Once | INTRAVENOUS | Status: DC

## 2021-03-24 NOTE — ED Notes (Addendum)
Called lab to confirm mag add on

## 2021-03-24 NOTE — Sedation Documentation (Signed)
Pt not tolerating etomidate after cardioversion, pt desatted to 57%, BVM applied with jaw thrust maneuver. Pt successfully converted back to sinus  rhythm

## 2021-03-24 NOTE — ED Triage Notes (Signed)
Pt arrived via POV states she is in A-fib w/RVR. Pt states her pulse has been in the 40's for weeks, but this morning when she woke up, her pulse was 110. Pt is A&Ox4. Pt is tachy up to the 130's

## 2021-03-24 NOTE — ED Provider Notes (Signed)
[facility] EMERGENCY DEPARTMENT Provider Note   CSN: [id number] Arrival date & time: 03/24/21  1807     History  Chief Complaint  Patient presents with   Atrial Fibrillation    [name] is a 76 y.o. female.  [name] is a 76 y.o. female with history of paroxysmal A. fib, on anticoagulation, hypertension, diabetes, hypothyroidism, GERD, who presents to the emergency department for ration of rapid heart rate and palpitations.  Patient reports that she has a known history of A. fib but for several weeks she has been persistently bradycardic with heart rate in the 40s, but reports she woke up this morning, felt palpitations and her heart rate was noted to be ranging from the 110s-140s at home.  She called her cardiology office and was instructed to take a 25 mg dose of metoprolol, but this did not improve her heart rate so she was instructed to present to the emergency department.  She reports feeling a bit lightheaded and dizzy with this rapid heart rate.  Denies chest pain or shortness of breath.  Reports she has been a bit fatigued.  Reports recently with her heart rate in the 40s she has been feeling similarly with fatigue and dizziness and has had some falls recently and but without distinct syncopal events.  Was recently seen by Dr. Lovena Le with the EP, and recently had her metoprolol dose cut in half to 12.5 hopefully help with bradycardia and fatigue.  Dr. Lovena Le is planning to do a 2-week Zio patch monitor for further evaluation of bradycardia and dizziness.  It appears that patient has fairly consistently been in sinus rhythm with bradycardia during all of her recent cardiology follow-ups.  She is anticoagulated with Eliquis and reports compliance with this medication.  She also takes flecainide and low-dose metoprolol.  The history is provided by the patient, the spouse and medical records.      Home Medications Prior to Admission medications   Medication  Sig Start Date End Date Taking? Authorizing Provider  Ascorbic Acid (VITAMIN C PO) Take 1 tablet by mouth at bedtime.    [provider]  atorvastatin (LIPITOR) 10 MG tablet TAKE ONE TABLET BY MOUTH AT BEDTIME 03/19/21   Bedsole, Amy E, MD  calcium carbonate (OS-CAL - DOSED IN MG OF ELEMENTAL CALCIUM) 1250 (500 Ca) MG tablet Take 1 tablet by mouth daily with breakfast.    [provider]  ELIQUIS 5 MG TABS tablet TAKE 1 TABLET BY MOUTH TWICE A DAY 02/01/21   Allred, Jeneen Rinks, MD  escitalopram (LEXAPRO) 10 MG tablet Take 1 tablet (10 mg total) by mouth daily. 02/27/21   Bedsole, Amy E, MD  flecainide (TAMBOCOR) 100 MG tablet Take 1 tablet (100 mg total) by mouth 2 (two) times daily. 03/19/21   Fenton, Clint R, PA  hydroxypropyl methylcellulose / hypromellose (ISOPTO TEARS / GONIOVISC) 2.5 % ophthalmic solution Place 1 drop into both eyes 2 (two) times daily as needed for dry eyes.    [provider]  linagliptin (TRADJENTA) 5 MG TABS tablet Take 1 tablet (5 mg total) by mouth daily. 02/05/21   [name], MD  metFORMIN (GLUCOPHAGE-XR) 500 MG 24 hr tablet TAKE 2 TABLETS (1,000 MG TOTAL) BY MOUTH 2 (TWO) TIMES DAILY WITH A MEAL. 01/23/21   [name], MD  metoprolol succinate (TOPROL XL) 25 MG 24 hr tablet Take 0.5 tablets (12.5 mg total) by mouth at bedtime. 03/15/21   Fenton, Doloris Hall, PA  [employer]  50 MG TB24 tablet Take 50 mg by mouth daily. 11/27/20   [provider]  ONETOUCH ULTRA test strip CHECK BLOOD SUGAR 2 TIMES A DAY AS DIRECTED DX CODE E11.9 08/24/20   [name], MD  traZODone (DESYREL) 50 MG tablet Take 1 tablet (50 mg total) by mouth at bedtime as needed for sleep. 03/19/21   Chesley Mires, MD  trimethoprim (TRIMPEX) 100 MG tablet Take 100 mg by mouth daily.    [provider]  VITAMIN D PO Take 5,000 mg by mouth daily in the afternoon.    [provider]      Allergies    Cortisone, Pravachol [pravastatin sodium], and  Contrast media [iodinated contrast media]    Review of Systems   Review of Systems  Constitutional:  Positive for fatigue. Negative for chills and fever.  HENT: Negative.    Respiratory:  Negative for cough and shortness of breath.   Cardiovascular:  Positive for palpitations. Negative for chest pain and leg swelling.  Gastrointestinal:  Negative for abdominal pain and nausea.  Genitourinary:  Negative for dysuria.  Musculoskeletal:  Negative for myalgias.  Skin:  Negative for color change and rash.  Neurological:  Positive for dizziness and light-headedness. Negative for syncope.  All other systems reviewed and are negative.  Physical Exam Updated Vital Signs BP 133/84    Pulse (!) 129    Temp 98.4 F (36.9 C) (Oral)    Resp (!) 23    Ht 5\' 4"  (1.626 m)    Wt 75.8 kg    LMP  (LMP Unknown)    SpO2 98%    BMI 28.68 kg/m  Physical Exam Vitals and nursing note reviewed.  Constitutional:      General: She is not in acute distress.    Appearance: Normal appearance. She is well-developed. She is not diaphoretic.  HENT:     Head: Normocephalic and atraumatic.  Eyes:     General:        Right eye: No discharge.        Left eye: No discharge.     Pupils: Pupils are equal, round, and reactive to light.  Cardiovascular:     Rate and Rhythm: Tachycardia present. Rhythm irregular.     Pulses: Normal pulses.     Heart sounds: Normal heart sounds.     Comments: Tachycardia with irregularly irregular rhythm ranging in the 110s-130s Pulmonary:     Effort: Pulmonary effort is normal. No respiratory distress.     Breath sounds: Normal breath sounds. No wheezing or rales.     Comments: Respirations equal and unlabored, patient able to speak in full sentences, lungs clear to auscultation bilaterally  Abdominal:     General: Bowel sounds are normal. There is no distension.     Palpations: Abdomen is soft. There is no mass.     Tenderness: There is no abdominal tenderness. There is no guarding.      Comments: Abdomen soft, nondistended, nontender to palpation in all quadrants without guarding or peritoneal signs  Musculoskeletal:        General: No deformity.     Cervical back: Neck supple.     Right lower leg: No edema.     Left lower leg: No edema.  Skin:    General: Skin is warm and dry.     Capillary Refill: Capillary refill takes less than 2 seconds.  Neurological:     Mental Status: She is alert and oriented to person, place, and  time.     Coordination: Coordination normal.     Comments: Speech is clear, able to follow commands Moves extremities without ataxia, coordination intact  Psychiatric:        Mood and Affect: Mood normal.        Behavior: Behavior normal.    ED Results / Procedures / Treatments   Labs (all labs ordered are listed, but only abnormal results are displayed) Labs Reviewed  BASIC METABOLIC PANEL - Abnormal; Notable for the following components:      Result Value   Sodium 131 (*)    Chloride 95 (*)    Glucose, Bld 166 (*)    Creatinine, Ser 1.19 (*)    GFR, Estimated 48 (*)    All other components within normal limits  RESP PANEL BY RT-PCR (FLU A&B, COVID) ARPGX2  CBC  MAGNESIUM  TROPONIN I (HIGH SENSITIVITY)  TROPONIN I (HIGH SENSITIVITY)    EKG Arrival EKG ED ECG REPORT  Date: 03/24/2021  Rate: 127  Rhythm: atrial fibrillation  QRS Axis: normal  Intervals: QT prolonged  ST/T Wave abnormalities: nonspecific T wave changes  Conduction Disutrbances:none  Narrative Interpretation: Afib RVR  Old EKG Reviewed: changes noted, Afib with faster rate  I have personally reviewed the EKG tracing and agree with the computerized printout as noted.  Post Cardioversion EKG Interpretation  Date/Time:  Saturday March 24 2021 22:31:10 EST Ventricular Rate:  52 PR Interval:  211 QRS Duration: 104 QT Interval:  416 QTC Calculation: 387 R Axis:   70 Text Interpretation: Sinus rhythm RSR' in V1 or V2, right VCD or RVH Confirmed by Malvin Johns [id number] on 03/24/2021 11:20:10 PM  Radiology DG Chest Port 1 View  Result Date: 03/24/2021 CLINICAL DATA:  Chest pain EXAM: PORTABLE CHEST 1 VIEW COMPARISON:  07/26/2019 FINDINGS: The heart size and mediastinal contours are within normal limits. Both lungs are clear. The visualized skeletal structures are unremarkable. IMPRESSION: No active disease. Electronically Signed   By: Rolm Baptise M.D.   On: 03/24/2021 20:54    Procedures .Cardioversion  Date/Time: 03/24/2021 10:37 PM Performed by: Jacqlyn Larsen, PA-C Authorized by: Jacqlyn Larsen, PA-C   Consent:    Consent obtained:  Verbal and written   Consent given by:  Patient   Risks discussed:  Cutaneous burn, death, induced arrhythmia and pain   Alternatives discussed:  No treatment Pre-procedure details:    Cardioversion basis:  Emergent   Rhythm:  Atrial fibrillation   Electrode placement:  Anterior-lateral Patient sedated: Yes. Refer to sedation procedure documentation for details of sedation.  Attempt one:    Cardioversion mode:  Synchronous   Waveform:  Biphasic   Shock (Joules):  200   Shock outcome:  Conversion to normal sinus rhythm (Sinus brady) Post-procedure details:    Patient status:  Alert   Patient tolerance of procedure:  Tolerated well, no immediate complications Comments:     Please see Dr. Mollie Germany separate documentation for procedural sedation.  Patient did have a drop in her O2 sats after administration of etomidate and successful synchronized cardioversion and had to have assisted ventilations with BVM for about 1 minute and then became more responsive and maintained normal O2 sats on room air.  .Critical Care Performed by: Jacqlyn Larsen, PA-C Authorized by: Jacqlyn Larsen, PA-C   Critical care provider statement:    Critical care time (minutes):  30   Critical care was necessary to treat or prevent imminent or life-threatening deterioration of the following  conditions:  Cardiac failure (AFib  RVR)   Critical care was time spent personally by me on the following activities:  Development of treatment plan with patient or surrogate, discussions with consultants, evaluation of patient's response to treatment, examination of patient, ordering and review of laboratory studies, ordering and review of radiographic studies, ordering and performing treatments and interventions, pulse oximetry, re-evaluation of patient's condition and review of old charts    Medications Ordered in ED Medications - No data to display  ED Course/ Medical Decision Making/ A&P                           [name] is a 76 y.o. female presents to the ED for concern of rapid heart rate, palpitations and dizziness, this involves an extensive number of treatment options, and is a complaint that carries with it a high risk of complications and morbidity.  The differential diagnosis includes A. fib, supraventricular tachycardia regular arrhythmia, near syncope, ACS, PE, dehydration, infection   Additional history obtained:  Additional history obtained from spouse at bedside External records from outside source obtained and reviewed including recent cardiology visit notes   Lab Tests:  I Ordered, reviewed, and interpreted labs.  The pertinent results include: No leukocytosis and normal hemoglobin, very slight increase in creatinine from baseline with mild hyponatremia, patient given small amount of IV fluids, negative troponin, normal magnesium, negative COVID and flu   Imaging Studies ordered:  I ordered imaging studies including chest x-ray  I independently visualized and interpreted imaging which showed no active cardiopulmonary disease I agree with the radiologist interpretation   Cardiac Monitoring:  The patient was maintained on a cardiac monitor.  I personally viewed and interpreted the cardiac monitored which showed an underlying rhythm of: Atrial fibrillation with rates in the  120s-130s   Medicines ordered and prescription drug management:  Will hold off on rate controlling medications patient may be candidate for cardioversion, took home metoprolol 25 mg without improvement in heart rate I have reviewed the patients home medicines and have made adjustments as needed   Critical Interventions:  Cardioversion   ED Course:  Patient with A. fib with RVR which is likely causing her symptoms, prior to this it looks like she is primarily in sinus bradycardia with heart rates in the 40s. Recently had her metoprolol dose decreased some due to bradycardia and fatigue, continues on flecainide and Eliquis Patient may be candidate for cardioversion, will discuss with cardiology   Consultations Obtained:  I requested consultation with the cardiologist, Dr. Martinique Tannenbaum,  and discussed lab and imaging findings as well as pertinent plan - they recommend: Cardioversion, and as long as patient is successfully cardioverted back to sinus rhythm, recommend continuing medication regimen with metoprolol and flecainide.  He is sent a chart message to patient's electrophysiologist for close follow-up   Reevaluation:  After the interventions noted above, I reevaluated the patient and found that they have :resolved Patient successfully cardioverted with heart rates ranging in the 40s-50s in normal sinus rhythm after 1 attempt, this is patient's baseline heart rate.  Patient is now feeling much better, she did have a period of hypoventilation during sedation with etomidate but after about a minute of assisted ventilations with BVM she quickly returned to baseline. Patient monitored in the ED until she was fully awake with no hypoventilation, tolerating p.o. and ambulatory without assistance.   Dispostion:  After consideration of the diagnostic results and the  patients response to treatment feel that the patent would benefit from discharge home with close follow-up with her  cardiologist after successful cardioversion.  We will have patient continue with regimen for A. fib, discussed appropriate return precautions.  Discharged home in good condition..         Final Clinical Impression(s) / ED Diagnoses Final diagnoses:  Atrial fibrillation with RVR [facility])    Rx / DC Orders ED Discharge Orders     None         Jacqlyn Larsen, [place] 03/25/21 1450    Malvin Johns, MD 03/25/21 [phone]

## 2021-03-24 NOTE — Telephone Encounter (Signed)
76 yo female with paroxysmal atrial fibrillation, bradycardia. She saw Dr. Lovena Le yesterday for bradycardia.  Monitor is pending. Her husband Laverna Peace, [place] on file) called the answering service because the patient's heart rate today is in the 115 range.  She feels dizzy.  Her BP was 150s/90s.  She takes Metoprolol succinate 12.5 mg at bedtime. PLAN: Take Metoprolol succinate 25 mg now If HR/symptoms do not improve, go to the ED. Richardson Dopp, PA-C    03/24/2021 2:03 PM

## 2021-03-24 NOTE — Sedation Documentation (Signed)
Pt able to tolerate nasal cannula at 2L maintaining appropriate O2 levels.

## 2021-03-24 NOTE — Consult Note (Signed)
Cardiology Consultation:   Patient ID: [name] MRN: [id number]; DOB: 1945/07/12  Admit date: 03/24/2021 Date of Consult: 03/24/2021  PCP:  Jinny Sanders, MD   [facility] Providers Cardiologist:  Kathlyn Sacramento, MD  Electrophysiologist:  Thompson Grayer, MD       Patient Profile:   [name] is a 76 y.o. female with a hx of moderate MR with severe LAE, paroxysmal afib s/p PVI 2018 and several DCCV, most recently 11/2020, on flecainide, metoprolol, and eliquis, s/p ILR with borderline sinus node dysfunction who is being seen 03/24/2021 for the evaluation of atrial fibrillation with RVR at the request of Dr Malvin Johns and PA Trudee Grip.  History of Present Illness:   Ms. Attwood has been closely following with EP (Dr Cristopher Peru) regarding both her afib and sinus node dysfunction. Having presyncopal episodes which may or may not be due to relative bradycardia; IRL without any concerning events but Zio placed to correlate symptoms with EKG findings, pending results. Woke 1/21 with afib symptoms, rates 110s-130s. Took extra metoprolol which did not help symptoms or HR so presented to the ED. There with HR 130s, appears to be Afib although R-R interval fairly regularized. Cardiology consulted   Past Medical History:  Diagnosis Date   Anemia    Anxiety    Atrial fibrillation ([place])    Colon polyps    Constipation    Depressive disorder, not elsewhere classified    Diabetes mellitus without complication ([place])    Diaphragmatic hernia without mention of obstruction or gangrene    Disorders of bursae and tendons in shoulder region, unspecified    Diverticulosis of colon (without mention of hemorrhage)    External hemorrhoid    Fundic gland polyps of stomach, benign    GERD (gastroesophageal reflux disease)    History of hiatal hernia    HTN (hypertension)    Hypertension    Hypothyroidism    Internal hemorrhoids without mention of complication    Moderate mitral  regurgitation    Obesity, unspecified    OSA on CPAP    compliant with CPAP   Other urinary incontinence    Persistent atrial fibrillation ([place])    Pure hypercholesterolemia    Type II diabetes mellitus ([place])    Urinary frequency    Urinary urgency    Vitamin B 12 deficiency     Past Surgical History:  Procedure Laterality Date   ABDOMINAL HYSTERECTOMY     ABDOMINAL HYSTERECTOMY  1972   Have both ovaries   ATRIAL FIBRILLATION ABLATION  12/17/2016   ATRIAL FIBRILLATION ABLATION N/A 12/17/2016   Procedure: ATRIAL FIBRILLATION ABLATION;  Surgeon: Thompson Grayer, MD;  Location: [place] [facility];  Service: Cardiovascular;  Laterality: N/A;   [place]   [place]; "Dr. [name]"   CARDIOVERSION N/A 05/27/2016   Procedure: CARDIOVERSION;  Surgeon: [name], MD;  Location: [facility];  Service: Cardiovascular;  Laterality: N/A;   CARDIOVERSION N/A 11/25/2016   Procedure: CARDIOVERSION;  Surgeon: [name], MD;  Location: [facility];  Service: Cardiovascular;  Laterality: N/A;   CARDIOVERSION N/A 09/18/2018   Procedure: CARDIOVERSION;  Surgeon: [name], MD;  Location: [place] [facility];  Service: Cardiovascular;  Laterality: N/A;   CARDIOVERSION N/A 07/28/2019   Procedure: CARDIOVERSION;  Surgeon: Kate Sable, MD;  Location: [facility];  Service: Cardiovascular;  Laterality: N/A;   CARDIOVERSION N/A 09/07/2019   Procedure: CARDIOVERSION;  Surgeon: Nelva Bush, MD;  Location: [facility];  Service:  Cardiovascular;  Laterality: N/A;   CATARACT EXTRACTION W/ INTRAOCULAR LENS  IMPLANT, BILATERAL Bilateral    COLONOSCOPY     ELECTROPHYSIOLOGIC STUDY N/A 01/24/2016   Procedure: CARDIOVERSION;  Surgeon: [name], MD;  Location: [facility];  Service: Cardiovascular;  Laterality: N/A;   implantable loop recorder implantation  10/18/2019   [employer] Reveal [place] model [phone]) implantable loop recorder    LAPAROSCOPIC CHOLECYSTECTOMY     REDUCTION  MAMMAPLASTY Bilateral    TONSILLECTOMY     TOTAL HIP ARTHROPLASTY Right 05/12/2018   Procedure: TOTAL HIP ARTHROPLASTY ANTERIOR APPROACH;  Surgeon: Paralee Cancel, MD;  Location: [facility];  Service: Orthopedics;  Laterality: Right;  70 mins     Home Medications:  Prior to Admission medications   Medication Sig Start Date End Date Taking? Authorizing Provider  Ascorbic Acid (VITAMIN C PO) Take 1 tablet by mouth at bedtime.   Yes [provider]  atorvastatin (LIPITOR) 10 MG tablet TAKE ONE TABLET BY MOUTH AT BEDTIME Patient taking differently: Take 10 mg by mouth at bedtime. 03/19/21  Yes Bedsole, Amy E, MD  calcium carbonate (OS-CAL - DOSED IN MG OF ELEMENTAL CALCIUM) 1250 (500 Ca) MG tablet Take 1 tablet by mouth daily with breakfast.   Yes [provider]  ELIQUIS 5 MG TABS tablet TAKE 1 TABLET BY MOUTH TWICE A DAY Patient taking differently: Take 5 mg by mouth 2 (two) times daily. 02/01/21  Yes Allred, Jeneen Rinks, MD  escitalopram (LEXAPRO) 10 MG tablet Take 1 tablet (10 mg total) by mouth daily. 02/27/21  Yes Bedsole, Amy E, MD  flecainide (TAMBOCOR) 100 MG tablet Take 1 tablet (100 mg total) by mouth 2 (two) times daily. 03/19/21  Yes Fenton, Clint R, PA  hydroxypropyl methylcellulose / hypromellose (ISOPTO TEARS / GONIOVISC) 2.5 % ophthalmic solution Place 1 drop into both eyes 2 (two) times daily as needed for dry eyes.   Yes [provider]  linagliptin (TRADJENTA) 5 MG TABS tablet Take 1 tablet (5 mg total) by mouth daily. 02/05/21  Yes [name], MD  metFORMIN (GLUCOPHAGE-XR) 500 MG 24 hr tablet TAKE 2 TABLETS (1,000 MG TOTAL) BY MOUTH 2 (TWO) TIMES DAILY WITH A MEAL. 01/23/21  Yes [name], MD  metoprolol succinate (TOPROL XL) 25 MG 24 hr tablet Take 0.5 tablets (12.5 mg total) by mouth at bedtime. 03/15/21  Yes Fenton, Clint R, PA  MYRBETRIQ 50 MG TB24 tablet Take 50 mg by mouth daily. 11/27/20  Yes [provider]  traZODone (DESYREL) 50 MG  tablet Take 1 tablet (50 mg total) by mouth at bedtime as needed for sleep. 03/19/21  Yes Chesley Mires, MD  trimethoprim (TRIMPEX) 100 MG tablet Take 100 mg by mouth daily.   Yes [provider]  VITAMIN D PO Take 5,000 mg by mouth daily in the afternoon.   Yes [provider]  ONETOUCH ULTRA test strip CHECK BLOOD SUGAR 2 TIMES A DAY AS DIRECTED DX CODE E11.9 08/24/20   [name], MD    Inpatient Medications: Scheduled Meds:  Continuous Infusions:  PRN Meds:   Allergies:    Allergies  Allergen Reactions   Cortisone Shortness Of Breath and Rash   Pravachol [Pravastatin Sodium]     Leg pain    Contrast Media [Iodinated Contrast Media] Rash    Social History:   Social History   Socioeconomic History   Marital status: Married    Spouse name: Laverna Peace   Number of children: 2   Years of  education: Not on file   Highest education level: Not on file  Occupational History   Not on file  Tobacco Use   Smoking status: Never   Smokeless tobacco: Never  Vaping Use   Vaping Use: Never used  Substance and Sexual Activity   Alcohol use: No    Alcohol/week: 0.0 standard drinks   Drug use: No   Sexual activity: Yes  Other Topics Concern   Not on file  Social History Narrative   ** Merged History Encounter **       Exercise walks 3 times daily Lives with spouse in [place] - retired from employment 1 son and 1 daughter  Diet fruit, occ fast food, salads and lean meat  3 tea/day, no EtOH, no tobacco, no drugs    Social Determinants of Radio broadcast assistant Strain: Low Risk    Difficulty of Paying Living Expenses: Not very hard  Food Insecurity: No Food Insecurity   Worried About Charity fundraiser in the Last Year: Never true   [place] in the Last Year: Never true  Transportation Needs: No Transportation Needs   Lack of Transportation (Medical): No   Lack of Transportation (Non-Medical): No  Physical Activity:  Insufficiently Active   Days of Exercise per Week: 3 days   Minutes of Exercise per Session: 40 min  Stress: No Stress Concern Present   Feeling of Stress : Not at all  Social Connections: Not on file  Intimate Partner Violence: Not At Risk   Fear of Current or Ex-Partner: No   Emotionally Abused: No   Physically Abused: No   Sexually Abused: No    Family History:    Family History  Problem Relation Age of Onset   Cancer Mother        fallopian tube   Dementia Mother    Pulmonary embolism Mother    Hyperlipidemia Mother    Diabetes Mother    Hyperlipidemia Father    Lung cancer Father    Hypertension Sister    Diabetes Brother    Cancer Brother    Stroke Paternal Grandmother    Stroke Paternal Grandfather    Kidney cancer Son    Dementia Maternal Aunt    Colon cancer Neg Hx    Esophageal cancer Neg Hx      ROS:  Please see the history of present illness.   All other ROS reviewed and negative.     Physical Exam/Data:   Vitals:   03/24/21 2217 03/24/21 2224 03/24/21 2226 03/24/21 2236  BP: 102/76 102/76 (!) 178/68 133/60  Pulse: (!) 131 60 (!) 51 (!) 53  Resp: 20 15 14 20   Temp:      TempSrc:      SpO2: 99% 100% 97% 100%  Weight:      Height:       No intake or output data in the 24 hours ending 03/24/21 2236 Last 3 Weights 03/24/2021 03/23/2021 03/23/2021  Weight (lbs) 167 lb 1.7 oz 167 lb 166 lb 12.8 oz  Weight (kg) 75.8 kg 75.751 kg 75.66 kg     Body mass index is 28.68 kg/m.  General:  Well nourished, well developed, in no acute distress HEENT: normal Neck: no JVD Vascular: No carotid bruits; Distal pulses 2+ bilaterally Cardiac:  normal S1, S2; IRIR rate 130s; no murmur  Lungs:  clear to auscultation bilaterally, no wheezing, rhonchi or rales  Abd: soft, nontender, no hepatomegaly  Ext: no edema  Musculoskeletal:  No deformities, BUE and BLE strength normal and equal Skin: warm and dry  Neuro:  CNs 2-12 intact, no focal abnormalities noted Psych:   Normal affect   EKG:  The EKG was personally reviewed and demonstrates:  afib RVR Telemetry:  Telemetry was personally reviewed and demonstrates:  afib RVR, mostly 120s-130s  Relevant CV Studies:   Laboratory Data:  High Sensitivity Troponin:   Recent Labs  Lab 03/24/21 1824 03/24/21 2137  TROPONINIHS 6 6     Chemistry Recent Labs  Lab 03/24/21 1824 03/24/21 1918  NA 131*  --   K 4.4  --   CL 95*  --   CO2 27  --   GLUCOSE 166*  --   BUN 12  --   CREATININE 1.19*  --   CALCIUM 9.3  --   MG  --  1.7  GFRNONAA 48*  --   ANIONGAP 9  --     No results for input(s): PROT, ALBUMIN, AST, ALT, ALKPHOS, BILITOT in the last 168 hours. Lipids No results for input(s): CHOL, TRIG, HDL, LABVLDL, LDLCALC, CHOLHDL in the last 168 hours.  Hematology Recent Labs  Lab 03/24/21 1824  WBC 7.8  RBC 4.24  HGB 12.0  HCT 38.0  MCV 89.6  MCH 28.3  MCHC 31.6  RDW 13.0  PLT 323   Thyroid No results for input(s): TSH, FREET4 in the last 168 hours.  BNPNo results for input(s): BNP, PROBNP in the last 168 hours.  DDimer No results for input(s): DDIMER in the last 168 hours.   Radiology/Studies:  DG Chest Port 1 View  Result Date: 03/24/2021 CLINICAL DATA:  Chest pain EXAM: PORTABLE CHEST 1 VIEW COMPARISON:  07/26/2019 FINDINGS: The heart size and mediastinal contours are within normal limits. Both lungs are clear. The visualized skeletal structures are unremarkable. IMPRESSION: No active disease. Electronically Signed   By: Rolm Baptise M.D.   On: 03/24/2021 20:54     Assessment and Plan:  Ms Narvaiz is a 79 YOF hx pAfib s/p PVI and multiple DCCV on flecainide, metop, eliquis; severe LAE, baseline sinus bradycardia, possibly symptomatic. Presents with RVR.    Afib RVR: I discussed options for managing Ms Schillaci's afib at length with her and her husband. These include switching flecainide to tikosyn, however I suspect given severe LAE that changing AADs will not prevent future RVR  episodes. Also discussed DCCV followed by outpatient evaluation either for PVI + posterior wall ablation or for AVNA/PPM given possible SND and failure of previous ablation. She prefers DCCV in the ED and discharge, with follow-up with Dr Lovena Le to discuss further potential interventions. The ED successfully cardioverted Ms Bilski to sinus bradycardia I have messaged Dr Lovena Le Ms Engdahl will continue her current home regimen for Afib   Risk Assessment/Risk Scores:          CHA2DS2-VASc Score = 4   This indicates a 4.8% annual risk of stroke. The patient's score is based upon: CHF History: 0 HTN History: 1 Diabetes History: 1 Stroke History: 0 Vascular Disease History: 0 Age Score: 1 Gender Score: 1     [facility] will sign off.   Medication Recommendations:  home regimen Other recommendations (labs, testing, etc):  none Follow up as an outpatient:  afib clinic  For questions or updates, please contact [place] Please consult [email or web address] for contact info under    Signed, [place] Daria Mcmeekin, MD  03/24/2021 10:36 PM

## 2021-03-24 NOTE — ED Notes (Signed)
Pt ambulated in room independently with staff supervision.

## 2021-03-24 NOTE — Progress Notes (Signed)
RRT at bedside for the procedural sedation for synchronized cardioversion. rhythm is atrial fibrillation. Patient was unable to tolerate etomidate conscious sedation well, oxygenation was compromised w/ saturations in the 60% after shock requiring BVM for about a minute in order to achieve acceptable O2 sat levels. Hemodynamic parameters were stable throughout. Patient had 1 shock which was successful to NSR rhythm. MD at bedside throughout   [place]. Tamala Julian, BS, RRT-ACCS, RCP

## 2021-03-24 NOTE — ED Provider Notes (Signed)
Patient was seen in combination with Benedetto Goad, PA-C.  She presents in atrial fibrillation.  She has a known history of A. fib.  She is on Eliquis and has been compliant with this.  She had been recently started on new medications taken troll her atrial fibrillation.  She has actually been relatively bradycardic earlier this month.  She woke up this morning with tachycardia/palpitations.  Cardiology consult on patient and recommends cardioversion.  Patient was successfully cardioverted on 1 attempt.  .Sedation  Date/Time: 03/24/2021 10:13 PM Performed by: Malvin Johns, MD Authorized by: Malvin Johns, MD   Consent:    Consent obtained:  Written   Consent given by:  Patient   Risks discussed:  Prolonged hypoxia resulting in organ damage, inadequate sedation, respiratory compromise necessitating ventilatory assistance and intubation, vomiting and allergic reaction   Alternatives discussed:  Analgesia without sedation Universal protocol:    Procedure explained and questions answered to patient or proxy's satisfaction: yes     Relevant documents present and verified: yes     Test results available: yes     Imaging studies available: yes     Immediately prior to procedure, a time out was called: no     Patient identity confirmed:  Verbally with patient Indications:    Procedure performed:  Cardioversion   Procedure necessitating sedation performed by:  Different physician Pre-sedation assessment:    Time since last food or drink:  8 hours   ASA classification: class 3 - patient with severe systemic disease     Mouth opening:  3 or more finger widths   Thyromental distance:  3 finger widths   Mallampati score:  II - soft palate, uvula, fauces visible   Neck mobility: normal     Pre-sedation assessments completed and reviewed: airway patency, cardiovascular function, hydration status, mental status, nausea/vomiting, pain level, respiratory function and temperature     Pre-sedation  assessment completed:  03/24/2021 10:00 PM Immediate pre-procedure details:    Reassessment: Patient reassessed immediately prior to procedure     Reviewed: vital signs and NPO status     Verified: bag valve mask available, emergency equipment available, intubation equipment available, IV patency confirmed and oxygen available   Procedure details (see MAR for exact dosages):    Preoxygenation:  Nasal cannula   Sedation:  Etomidate   Intended level of sedation: deep   Intra-procedure monitoring:  Blood pressure monitoring, continuous capnometry, frequent LOC assessments, frequent vital sign checks, continuous pulse oximetry and cardiac monitor   Intra-procedure events: respiratory depression     Intra-procedure management:  BVM ventilation   Total Provider sedation time (minutes):  25 Post-procedure details:    Post-sedation assessment completed:  03/24/2021 10:30 PM   Attendance: Constant attendance by certified staff until patient recovered     Recovery: Patient returned to pre-procedure baseline     Post-sedation assessments completed and reviewed: airway patency, cardiovascular function, hydration status, mental status, nausea/vomiting, pain level, respiratory function and temperature     Patient is stable for discharge or admission: yes     Procedure completion:  Tolerated well, no immediate complications Comments:     Patient had 1 episode of hyperventilation during the cardioversion with some transient hypoxia.  She was ventilated with a bag-valve-mask for approximately 1 minute and was then maintaining her own ventilation.  She was monitored until she was fully awake and had no hypoventilation.     Malvin Johns, MD 03/24/21 2329

## 2021-03-24 NOTE — Discharge Instructions (Addendum)
Cardioversion was successful, please continue your flecainide, metoprolol and Eliquis and call to schedule close follow-up with your cardiologist.  If you have return of high heart rates or other new or worsening symptoms please return to the emergency department for reevaluation.

## 2021-03-26 ENCOUNTER — Encounter: Payer: Self-pay | Admitting: Physician Assistant

## 2021-03-26 ENCOUNTER — Ambulatory Visit: Payer: Medicare HMO | Admitting: Physician Assistant

## 2021-03-26 ENCOUNTER — Other Ambulatory Visit: Payer: Self-pay

## 2021-03-26 VITALS — BP 128/58 | HR 56 | Ht 64.0 in | Wt 170.4 lb

## 2021-03-26 DIAGNOSIS — I10 Essential (primary) hypertension: Secondary | ICD-10-CM | POA: Diagnosis not present

## 2021-03-26 DIAGNOSIS — I4819 Other persistent atrial fibrillation: Secondary | ICD-10-CM | POA: Diagnosis not present

## 2021-03-26 DIAGNOSIS — R001 Bradycardia, unspecified: Secondary | ICD-10-CM

## 2021-03-26 DIAGNOSIS — I471 Supraventricular tachycardia: Secondary | ICD-10-CM

## 2021-03-26 NOTE — Progress Notes (Signed)
Cardiology Office Note Date:  03/26/2021  Patient ID:  [name], [name] 21-Sep-1945, MRN [id number] PCP:  Jinny Sanders, MD  Cardiologist:  Dr. Fletcher Anon Electrophysiologist: Dr. Lovena Le    Chief Complaint: post ER  History of Present Illness: [name] is a 76 y.o. 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 Lovena Le, saw him 03/23/21, mentions some issues with falls, no syncope, maintaining SR (99% of the time), her flecainide continued.  Did not think there was indication yet for pacing Planned to wear a monitor to try and correlate her episodes of loss of postural tone to HR/rhythm at the time.  She had an ER visit 03/24/21 for palpitations and noting HRs at hom 110's-140's, she reached out to the office and advised to take an additional dose of metoprolol without improvement, she was in Afib 120's-130's, cardiology consulted and underwent  DCCV in the ER to SB 50's  TODAY She is accompanied by her husband, he provides much of her PMHx and HPI. She reports that the episodes that she falls, are not full syncope, but marked weakness. She will sometimes have a sense of feeling lightheaded a few seconds prior, but not always She will sometimes have a sense of palpitations, sometimes not. She feels like she is aware of having Afib at least monthly if not more often With her AFib, does not particularly provoke her weak spells but can make her feel weak and lightheaded as well. No CP No SOB No bleeding or signs of bleeding Reports good compliance with her medicines   AFib hx Diagnosed 2015 Flecainide 2015, has been titrated up and down over the years, remains current PVI/Left Atrium, and CTI ablation 2018 (Dr. Rayann Heman) [place] noted 2020 Syncope > ILR placed  Device information MDT LINQII implanted 10/18/2019   Past Medical History:  Diagnosis Date   Anemia    Anxiety    Atrial fibrillation ([place])     Colon polyps    Constipation    Depressive disorder, not elsewhere classified    Diabetes mellitus without complication ([place])    Diaphragmatic hernia without mention of obstruction or gangrene    Disorders of bursae and tendons in shoulder region, unspecified    Diverticulosis of colon (without mention of hemorrhage)    External hemorrhoid    Fundic gland polyps of stomach, benign    GERD (gastroesophageal reflux disease)    History of hiatal hernia    HTN (hypertension)    Hypertension    Hypothyroidism    Internal hemorrhoids without mention of complication    Moderate mitral regurgitation    Obesity, unspecified    OSA on CPAP    compliant with CPAP   Other urinary incontinence    Persistent atrial fibrillation ([place])    Pure hypercholesterolemia    Type II diabetes mellitus ([place])    Urinary frequency    Urinary urgency    Vitamin B 12 deficiency     Past Surgical History:  Procedure Laterality Date   ABDOMINAL HYSTERECTOMY     ABDOMINAL HYSTERECTOMY  1972   Have both ovaries   ATRIAL FIBRILLATION ABLATION  12/17/2016   ATRIAL FIBRILLATION ABLATION N/A 12/17/2016   Procedure: ATRIAL FIBRILLATION ABLATION;  Surgeon: Thompson Grayer, MD;  Location: [facility];  Service: Cardiovascular;  Laterality: N/A;   [place]; "Dr. [name]"   CARDIOVERSION N/A 05/27/2016  Procedure: CARDIOVERSION;  Surgeon: [name], MD;  Location: [facility];  Service: Cardiovascular;  Laterality: N/A;   CARDIOVERSION N/A 11/25/2016   Procedure: CARDIOVERSION;  Surgeon: [name], MD;  Location: [facility];  Service: Cardiovascular;  Laterality: N/A;   CARDIOVERSION N/A 09/18/2018   Procedure: CARDIOVERSION;  Surgeon: [name], MD;  Location: [facility];  Service: Cardiovascular;  Laterality: N/A;   CARDIOVERSION N/A 07/28/2019   Procedure: CARDIOVERSION;  Surgeon: Kate Sable, MD;  Location: [facility];  Service: Cardiovascular;  Laterality:  N/A;   CARDIOVERSION N/A 09/07/2019   Procedure: CARDIOVERSION;  Surgeon: Nelva Bush, MD;  Location: [facility];  Service: Cardiovascular;  Laterality: N/A;   CATARACT EXTRACTION W/ INTRAOCULAR LENS  IMPLANT, BILATERAL Bilateral    COLONOSCOPY     ELECTROPHYSIOLOGIC STUDY N/A 01/24/2016   Procedure: CARDIOVERSION;  Surgeon: [name], MD;  Location: [facility];  Service: Cardiovascular;  Laterality: N/A;   implantable loop recorder implantation  10/18/2019   Medtronic Reveal Winn model [id number]) implantable loop recorder    LAPAROSCOPIC CHOLECYSTECTOMY     REDUCTION MAMMAPLASTY Bilateral    TONSILLECTOMY     TOTAL HIP ARTHROPLASTY Right 05/12/2018   Procedure: TOTAL HIP ARTHROPLASTY ANTERIOR APPROACH;  Surgeon: Paralee Cancel, MD;  Location: [facility];  Service: Orthopedics;  Laterality: Right;  70 mins    Current Outpatient Medications  Medication Sig Dispense Refill   Ascorbic Acid (VITAMIN C PO) Take 1 tablet by mouth at bedtime.     atorvastatin (LIPITOR) 10 MG tablet TAKE ONE TABLET BY MOUTH AT BEDTIME (Patient taking differently: Take 10 mg by mouth at bedtime.) 90 tablet 1   calcium carbonate (OS-CAL - DOSED IN MG OF ELEMENTAL CALCIUM) 1250 (500 Ca) MG tablet Take 1 tablet by mouth daily with breakfast.     ELIQUIS 5 MG TABS tablet TAKE 1 TABLET BY MOUTH TWICE A DAY (Patient taking differently: Take 5 mg by mouth 2 (two) times daily.) 60 tablet 9   escitalopram (LEXAPRO) 10 MG tablet Take 1 tablet (10 mg total) by mouth daily. 90 tablet 1   flecainide (TAMBOCOR) 100 MG tablet Take 1 tablet (100 mg total) by mouth 2 (two) times daily. 180 tablet 1   hydroxypropyl methylcellulose / hypromellose (ISOPTO TEARS / GONIOVISC) 2.5 % ophthalmic solution Place 1 drop into both eyes 2 (two) times daily as needed for dry eyes.     linagliptin (TRADJENTA) 5 MG TABS tablet Take 1 tablet (5 mg total) by mouth daily. 90 tablet 3   metFORMIN (GLUCOPHAGE-XR) 500 MG 24 hr tablet TAKE 2  TABLETS (1,000 MG TOTAL) BY MOUTH 2 (TWO) TIMES DAILY WITH A MEAL. 360 tablet 1   metoprolol succinate (TOPROL XL) 25 MG 24 hr tablet Take 0.5 tablets (12.5 mg total) by mouth at bedtime. 30 tablet 3   MYRBETRIQ 50 MG TB24 tablet Take 50 mg by mouth daily.     ONETOUCH ULTRA test strip CHECK BLOOD SUGAR 2 TIMES A DAY AS DIRECTED DX CODE E11.9 100 strip 7   traZODone (DESYREL) 50 MG tablet Take 1 tablet (50 mg total) by mouth at bedtime as needed for sleep. 30 tablet 5   trimethoprim (TRIMPEX) 100 MG tablet Take 100 mg by mouth daily.     VITAMIN D PO Take 5,000 mg by mouth daily in the afternoon.     No current facility-administered medications for this visit.    Allergies:   Cortisone, Pravachol [pravastatin sodium], and Contrast media [iodinated contrast  media]   Social History:  The patient  reports that she has never smoked. She has never used smokeless tobacco. She reports that she does not drink alcohol and does not use drugs.   Family History:  The patient's family history includes Cancer in her brother and mother; Dementia in her maternal aunt and mother; Diabetes in her brother and mother; Hyperlipidemia in her father and mother; Hypertension in her sister; Kidney cancer in her son; Lung cancer in her father; Pulmonary embolism in her mother; Stroke in her paternal grandfather and paternal grandmother.  ROS:  Please see the history of present illness.    All other systems are reviewed and otherwise negative.   PHYSICAL EXAM:  VS:  BP (!) 128/58    Pulse (!) 56    Ht 5\' 4"  (1.626 m)    Wt 170 lb 6.4 oz (77.3 kg)    LMP  (LMP Unknown)    SpO2 96%    BMI 29.25 kg/m  BMI: Body mass index is 29.25 kg/m. Well nourished, well developed, in no acute distress HEENT: normocephalic, atraumatic Neck: no JVD, carotid bruits or masses Cardiac:  RRR; no significant murmurs, no rubs, or gallops Lungs:  CTA b/l, no wheezing, rhonchi or rales Abd: soft, nontender MS: no deformity or  atrophy Ext: no edema Skin: warm and dry, no rash Neuro:  No gross deficits appreciated Psych: euthymic mood, full affect  ILR site is stable, no tethering or discomfort   EKG:  Done today and reviewed by myself shows  SB 56bpm, 1st degree AVblock, 272ms, QRS 152ms  ER EKGs #1 Atach vs Aflutter 134bpm #2 starts a bit slower the 1st 2 beats, though otherwise looks like an ATch vs AFlutter, 127bpm  Device interrogation done today and reviewed by myself:  Battery is good R waves 0.25mV The device does not make note of the tachycardia she had on the 21st She does have some Afib and tachy episodes logged by the device, none since Oct 7  04/24/20: TTE 1. Left ventricular ejection fraction, by estimation, is 55 to 60%. The  left ventricle has normal function. The left ventricle has no regional  wall motion abnormalities. The left ventricular internal cavity size was  mildly dilated. Left ventricular  diastolic parameters are indeterminate.   2. Right ventricular systolic function is normal. The right ventricular  size is normal. There is moderately elevated pulmonary artery systolic  pressure. The estimated right ventricular systolic pressure is 24.2 mmHg.   3. Left atrial size was severely dilated.   4. The mitral valve is normal in structure. Moderate mitral valve  regurgitation. No evidence of mitral stenosis.   5. The aortic valve is normal in structure. Aortic valve regurgitation is  not visualized. Mild aortic valve sclerosis is present, with no evidence  of aortic valve stenosis.   6. The inferior vena cava is normal in size with greater than 50%  respiratory variability, suggesting right atrial pressure of 3 mmHg.   Comparison(s): Prior Echo showed LV EF 55-60%, severe LAE with eccentric  MR.   12/17/2016: EPS/Ablation CONCLUSIONS: 1. Sinus rhythm upon presentation.   2. Intracardiac echo reveals a moderate to large sized left atrium. 3. Successful electrical isolation  and anatomical encircling of all four pulmonary veins with radiofrequency current. 4. Additional mapping and ablation within the left atrium due to persistence of atrial fibrillation with a posterior wall box demonstrated  5. Atrial fibrillation successfully cardioverted to sinus rhythm. 6. Cavo-tricuspid isthmus ablation performed  with complete bidirectional isthmus block achieved 7. Multiple atypical atrial flutter circuits also demonstrated today 8. No early apparent complications.  Recent Labs: 02/20/2021: ALT 9; TSH 2.43 03/24/2021: BUN 12; Creatinine, Ser 1.19; Hemoglobin 12.0; Magnesium 1.7; Platelets 323; Potassium 4.4; Sodium 131  09/26/2020: Cholesterol 127; HDL 42.50; LDL Cholesterol 74; Total CHOL/HDL Ratio 3; Triglycerides 51.0; VLDL 10.2   Estimated Creatinine Clearance: 41.7 mL/min (A) (by C-G formula based on SCr of 1.19 mg/dL (H)).   Wt Readings from Last 3 Encounters:  03/26/21 170 lb 6.4 oz (77.3 kg)  03/24/21 167 lb 1.7 oz (75.8 kg)  03/23/21 167 lb (75.8 kg)     Other studies reviewed: Additional studies/records reviewed today include: summarized above  ASSESSMENT AND PLAN:  Paroxysmal Afib Sinus brady, 1st degree AVblock CHA2DS2Vasc is 3, on Eliquis, appropriately dosed 0.6 % burden of Afib by the device  Flecainide and metoprolol Intervals are stable in review of older EKGs  I suspect her frequent palpitations/symptoms perhaps are the Atach (NOT logged by her ILR) I reprogrammed her device for both Afib and AT, and lowered her tachy rate to 128 (the rate ofher AT/AFlutter seen in the ER She has not yet received the event monitor in the mail, but did get notice was shipped I asked her to wear it and push the symptom button with her symptoms  ? Ablate this AT/Aflutter I will review with Dr. Lovena Le   MR Moderate, eccentric on her echo last year  3. HTN Looks OK   Disposition: F/u with [name] in a few weeks, though will communicate with her once I have  had opportunity to d/w Dr. Lovena Le, his thought/recommendations.  Current medicines are reviewed at length with the patient today.  The patient did not have any concerns regarding medicines.  Venetia Night, PA-C 03/26/2021 5:22 PM     [address] [place] [address] [phone] (office)  [phone] (fax)

## 2021-03-26 NOTE — Patient Instructions (Signed)
Medication Instructions:   Your physician recommends that you continue on your current medications as directed. Please refer to the Current Medication list given to you today.   *If you need a refill on your cardiac medications before your next appointment, please call your pharmacy*   Lab Work: [place]    If you have labs (blood work) drawn today and your tests are completely normal, you will receive your results only by: [place] (if you have MyChart) OR A paper copy in the mail If you have any lab test that is abnormal or we need to change your treatment, we will call you to review the results.   Testing/Procedures: NONE ORDERED  TODAY    Follow-Up: At [facility], you and your health needs are our priority.  As part of our continuing mission to provide you with exceptional heart care, we have created designated Provider Care Teams.  These Care Teams include your primary Cardiologist (physician) and Advanced Practice Providers (APPs -  Physician Assistants and Nurse Practitioners) who all work together to provide you with the care you need, when you need it.  We recommend signing up for the patient portal called "MyChart".  Sign up information is provided on this After Visit Summary.  MyChart is used to connect with patients for Virtual Visits (Telemedicine).  Patients are able to view lab/test results, encounter notes, upcoming appointments, etc.  Non-urgent messages can be sent to your provider as well.   To learn more about what you can do with MyChart, go to [email or web address].    Your next appointment:   2 week(s)  The format for your next appointment:   In Person  Provider:   Tommye Standard, PA-C   Other Instructions

## 2021-03-28 ENCOUNTER — Telehealth: Payer: Self-pay | Admitting: Physician Assistant

## 2021-03-28 ENCOUNTER — Ambulatory Visit (HOSPITAL_COMMUNITY): Payer: Medicare HMO | Admitting: Physician Assistant

## 2021-03-28 NOTE — Telephone Encounter (Signed)
Patient c/o Palpitations:  High priority if patient c/o lightheadedness, shortness of breath, or chest pain  How long have you had palpitations/irregular HR/ Afib? Since this morning   Are you having the symptoms now? yes  Are you currently experiencing lightheadedness, SOB or CP?   Do you have a history of afib (atrial fibrillation) or irregular heart rhythm?   Have you checked your BP or HR? (document readings if available):  12:32 pm: 170/100 HR 104   Are you experiencing any other symptoms? Dizziness, drowsiness   STAT if patient feels like he/she is going to faint   Are you dizzy now? A little  Do you feel faint or have you passed out? no  Do you have any other symptoms? See above  Have you checked your HR and BP (record if available)? See above  Husband of the patient called. He gave her the extra dose of metoprolol but is not sure what else to do

## 2021-03-28 NOTE — Telephone Encounter (Signed)
Re-reviewed her transmission from today including some of yesterday's tracings, slower rates appear clearly sinus, some of th faster events as well appear to be ST vs AT  Called to follow up with the patient. She identified herself with birth date, seemed to be on speaker phone with her husband as well. She reports doing better, they say that her HR has settled into the 80's- 110's and BP better as well Earlier today they had gone to the fire station where her HR was he thinks about 113 and her BP 170/100 Since then her HR has fluctuated 80's-110s' She feels a little lightheaded when it gets fast Better with lowered BP and HR. They have not seen any low BPs  Recommend that if her HR continues to fluctuate with faster rates to go ahead and increase the metoprolol to 25mg  daily. If is seems to settle down going forward would continue 12.5mg  daily and go to the the 25mg  dose only if needed.  Mentioned that I had spoken to Dr. Lovena Le yesterday, planned to monitor her tachycardia burden, did not think it would be a technically straightforward ablation, likely a L sided A tachycardia or flutter  Advised ER precautions, they understood,and were appreciative of the call.  Tommye Standard, PA-C

## 2021-03-28 NOTE — Telephone Encounter (Signed)
Spoke with patient aware to contact emergency services if feeling worser and heartrate over 130.

## 2021-03-28 NOTE — Telephone Encounter (Signed)
STAT if HR is under 50 or over 120 (normal HR is 60-100 beats per minute)  What is your heart rate? 113, 131  Do you have a log of your heart rate readings (document readings)? No   Do you have any other symptoms? Patient went into afib this morning. Patient experienced lightheadedness, dizziness, fatigued

## 2021-03-28 NOTE — Telephone Encounter (Signed)
Patient called back. Her BP has improved 125/70 and 110/75, but her HR is still elevated 113 and 131. Informed her if her symptoms get worse to go to the ED. Will forward to [employer] PA

## 2021-03-28 NOTE — Telephone Encounter (Signed)
Reviewed today's transmission, noting both SR/ST and suspect perhaps an AT, rates 90's-130's. Asked my MA to call the patient and follow up.  If she is feeling poorly and HR sustain> 130 she should seek attention. MA reported back to me that she had spoken to the patient, the patient reported she was feeling better, HR was improved, slowed down. Gave the advice as instructed to seek attention if HRs remain > 130 or feeling poorly and that the pt understood the instructions.  Tommye Standard, PA-C

## 2021-03-28 NOTE — Telephone Encounter (Signed)
Called patient back about message. Patient complaining of elevated HR 104 and BP 170/100, dizziness, and drowsiness which started right after waking up this morning. Patient's husband gave her metoprolol succinate 25 mg this morning, normally takes 12.5 mg in PM. Patient saw Tommye Standard PA two days ago, will send message to her for advisement.

## 2021-04-02 ENCOUNTER — Telehealth: Payer: Self-pay

## 2021-04-02 NOTE — Telephone Encounter (Signed)
Called patient to follow up and how she is feeling. Reports she is better. Able to tell her hear rate is lower, still in and out of AF. Patient is currently wearing ZIO. Advised patient if she has increased symptoms, please call to advise. Verbalized understanding.

## 2021-04-08 DIAGNOSIS — R001 Bradycardia, unspecified: Secondary | ICD-10-CM

## 2021-04-10 ENCOUNTER — Telehealth: Payer: Self-pay | Admitting: Neurology

## 2021-04-10 ENCOUNTER — Other Ambulatory Visit: Payer: Self-pay

## 2021-04-10 ENCOUNTER — Ambulatory Visit
Admission: RE | Admit: 2021-04-10 | Discharge: 2021-04-10 | Disposition: A | Discharge status: Home / Self Care | Payer: Medicare HMO | Source: Ambulatory Visit | Attending: Neurology | Admitting: Neurology

## 2021-04-10 ENCOUNTER — Encounter: Payer: Medicare HMO | Admitting: Physician Assistant

## 2021-04-10 DIAGNOSIS — R270 Ataxia, unspecified: Secondary | ICD-10-CM

## 2021-04-10 DIAGNOSIS — R42 Dizziness and giddiness: Secondary | ICD-10-CM | POA: Diagnosis not present

## 2021-04-10 NOTE — Telephone Encounter (Signed)
Let pt know that MRI brain looks okay and if she would like to proceed with DaT, as we discussed, we can do that.  [place], see notes about DaT in my last office note

## 2021-04-10 NOTE — Telephone Encounter (Signed)
Called patient and left voicemail.

## 2021-04-11 ENCOUNTER — Other Ambulatory Visit: Payer: Self-pay

## 2021-04-11 DIAGNOSIS — R251 Tremor, unspecified: Secondary | ICD-10-CM

## 2021-04-11 NOTE — Telephone Encounter (Signed)
Called patient and she agreed to [employer] has been sent in to [employer]

## 2021-04-12 ENCOUNTER — Other Ambulatory Visit: Payer: Self-pay

## 2021-04-12 ENCOUNTER — Emergency Department (HOSPITAL_BASED_OUTPATIENT_CLINIC_OR_DEPARTMENT_OTHER): Payer: Medicare HMO

## 2021-04-12 ENCOUNTER — Encounter: Payer: Self-pay | Admitting: Internal Medicine

## 2021-04-12 ENCOUNTER — Telehealth: Payer: Self-pay | Admitting: Internal Medicine

## 2021-04-12 ENCOUNTER — Encounter (HOSPITAL_BASED_OUTPATIENT_CLINIC_OR_DEPARTMENT_OTHER): Payer: Self-pay

## 2021-04-12 ENCOUNTER — Encounter: Payer: Self-pay | Admitting: Neurology

## 2021-04-12 ENCOUNTER — Emergency Department (HOSPITAL_BASED_OUTPATIENT_CLINIC_OR_DEPARTMENT_OTHER)
Admission: EM | Admit: 2021-04-12 | Discharge: 2021-04-12 | Disposition: A | Discharge status: Home / Self Care | Payer: Medicare HMO | Attending: Emergency Medicine | Admitting: Emergency Medicine

## 2021-04-12 DIAGNOSIS — W01198A Fall on same level from slipping, tripping and stumbling with subsequent striking against other object, initial encounter: Secondary | ICD-10-CM | POA: Insufficient documentation

## 2021-04-12 DIAGNOSIS — Z7901 Long term (current) use of anticoagulants: Secondary | ICD-10-CM | POA: Diagnosis not present

## 2021-04-12 DIAGNOSIS — Y92002 Bathroom of unspecified non-institutional (private) residence single-family (private) house as the place of occurrence of the external cause: Secondary | ICD-10-CM | POA: Insufficient documentation

## 2021-04-12 DIAGNOSIS — Z79899 Other long term (current) drug therapy: Secondary | ICD-10-CM | POA: Diagnosis not present

## 2021-04-12 DIAGNOSIS — I10 Essential (primary) hypertension: Secondary | ICD-10-CM | POA: Insufficient documentation

## 2021-04-12 DIAGNOSIS — S0990XA Unspecified injury of head, initial encounter: Secondary | ICD-10-CM | POA: Diagnosis not present

## 2021-04-12 DIAGNOSIS — Z7984 Long term (current) use of oral hypoglycemic drugs: Secondary | ICD-10-CM | POA: Insufficient documentation

## 2021-04-12 DIAGNOSIS — I4891 Unspecified atrial fibrillation: Secondary | ICD-10-CM | POA: Diagnosis not present

## 2021-04-12 DIAGNOSIS — E119 Type 2 diabetes mellitus without complications: Secondary | ICD-10-CM | POA: Insufficient documentation

## 2021-04-12 DIAGNOSIS — W19XXXA Unspecified fall, initial encounter: Secondary | ICD-10-CM

## 2021-04-12 DIAGNOSIS — I480 Paroxysmal atrial fibrillation: Secondary | ICD-10-CM

## 2021-04-12 DIAGNOSIS — R42 Dizziness and giddiness: Secondary | ICD-10-CM | POA: Diagnosis not present

## 2021-04-12 LAB — CBC WITH DIFFERENTIAL/PLATELET
Abs Immature Granulocytes: 0.02 10*3/uL (ref 0.00–0.07)
Basophils Absolute: 0 10*3/uL (ref 0.0–0.1)
Basophils Relative: 0 %
Eosinophils Absolute: 0.1 10*3/uL (ref 0.0–0.5)
Eosinophils Relative: 1 %
HCT: 34.3 % — ABNORMAL LOW (ref 36.0–46.0)
Hemoglobin: 11 g/dL — ABNORMAL LOW (ref 12.0–15.0)
Immature Granulocytes: 0 %
Lymphocytes Relative: 18 %
Lymphs Abs: 1.3 10*3/uL (ref 0.7–4.0)
MCH: 28.4 pg (ref 26.0–34.0)
MCHC: 32.1 g/dL (ref 30.0–36.0)
MCV: 88.4 fL (ref 80.0–100.0)
Monocytes Absolute: 0.4 10*3/uL (ref 0.1–1.0)
Monocytes Relative: 6 %
Neutro Abs: 5.5 10*3/uL (ref 1.7–7.7)
Neutrophils Relative %: 75 %
Platelets: 349 10*3/uL (ref 150–400)
RBC: 3.88 MIL/uL (ref 3.87–5.11)
RDW: 13.2 % (ref 11.5–15.5)
WBC: 7.3 10*3/uL (ref 4.0–10.5)
nRBC: 0 % (ref 0.0–0.2)

## 2021-04-12 LAB — BASIC METABOLIC PANEL
Anion gap: 7 (ref 5–15)
BUN: 15 mg/dL (ref 8–23)
CO2: 27 mmol/L (ref 22–32)
Calcium: 9.6 mg/dL (ref 8.9–10.3)
Chloride: 98 mmol/L (ref 98–111)
Creatinine, Ser: 1.21 mg/dL — ABNORMAL HIGH (ref 0.44–1.00)
GFR, Estimated: 47 mL/min — ABNORMAL LOW (ref >60)
Glucose, Bld: 113 mg/dL — ABNORMAL HIGH (ref 70–99)
Potassium: 4.9 mmol/L (ref 3.5–5.1)
Sodium: 132 mmol/L — ABNORMAL LOW (ref 135–145)

## 2021-04-12 LAB — MAGNESIUM: Magnesium: 1.7 mg/dL (ref 1.7–2.4)

## 2021-04-12 MED ORDER — METOPROLOL TARTRATE 5 MG/5ML IV SOLN
2.5000 mg | Freq: Once | INTRAVENOUS | Status: AC
  Administered 2021-04-12: 2.5 mg via INTRAVENOUS
  Filled 2021-04-12: qty 5

## 2021-04-12 MED ORDER — SODIUM CHLORIDE 0.9 % IV BOLUS
1000.0000 mL | Freq: Once | INTRAVENOUS | Status: AC
  Administered 2021-04-12: 1000 mL via INTRAVENOUS

## 2021-04-12 MED ORDER — METOPROLOL SUCCINATE ER 25 MG PO TB24
25.0000 mg | ORAL_TABLET | Freq: Once | ORAL | Status: AC
  Administered 2021-04-12: 25 mg via ORAL
  Filled 2021-04-12: qty 1

## 2021-04-12 MED ORDER — METOPROLOL TARTRATE 5 MG/5ML IV SOLN: 2.5000 mg | Freq: Once | INTRAVENOUS | Status: DC

## 2021-04-12 NOTE — Telephone Encounter (Signed)
Spoke with pt and husband (wife said ok to speak with him).  States she got up to go to the bathroom and while washing her hands became lightheaded and she fell and bumped her head.  Denies blacking out.  Denies headache or blurred vision.  Lightheadedness has resolved.  States there is no bump and she didn't hit her head hard.  Advised I will send message to device team to see about getting a transmission but that I was also going to go talk to Tommye Standard, PA-C to see if she felt pt needed to have a head CT.  Spoke with Joseph Art and she does agree that pt should be evaluated to make sure she doesn't have a bleed somewhere.  Spoke with husband and made him aware of these recommendations.  He states they will come out to [facility] and go to the new [facility] facility for eval.  Husband appreciative for assistance.

## 2021-04-12 NOTE — Telephone Encounter (Signed)
Returned call to husband. He is requesting a sooner appt for wife.  She has had increased dizziness with fall with head injury.  Has been evaluated in ER>  Scheduled with DOD on Monday April 16, 2021.  Family thankful for appt.

## 2021-04-12 NOTE — Telephone Encounter (Signed)
Husband of patient called. Patient fell again today and went to get a CT done. The patient's husband was told to get the patient in asap to be seen. Neither Dr. Lovena Le nor either EP APP had any availability and the husband was not happy with that . He needs to have his wife seen.  Please call the patient's Husband

## 2021-04-12 NOTE — ED Triage Notes (Signed)
Around 545 am this morning pt was going to bathroom and when she stood got dizzy and fell to the floor and hit head. Pt denies any LOC. Pt has been feeling dizzy more frequently and is scheduled for MRI soon. Pt complains of a slight headache and is on blood thinner

## 2021-04-12 NOTE — ED Notes (Signed)
Dc instructions reviewed with patient. Patient voiced understanding. Dc with belongings.  °

## 2021-04-12 NOTE — Telephone Encounter (Addendum)
No PA needed for Dat Scan sending to Camelia Eng. At [facility] for scheduling

## 2021-04-12 NOTE — ED Triage Notes (Addendum)
Error in charting.

## 2021-04-12 NOTE — ED Provider Notes (Signed)
[facility] EMERGENCY DEPT Provider Note   CSN: [id number] Arrival date & time: 04/12/21  1206     History  Chief Complaint  Patient presents with   Fall   Dizziness    [name] is a 76 y.o. female.  HPI 76 year old female presents with head injury.  This morning around 5 or 6 AM she was coming back from the bathroom and was lightheaded and dizzy and fell and hit her head.  She did not lose consciousness.  She has been having dizzy/lightheaded spells for months.  No clear cause has been found.  She has had paroxysmal atrial fibrillation and is currently on rate control and takes Eliquis.  No recent adjustments of her medicines and she has been compliant with her meds.  Last episode occurred was about a month ago.  In January she was cardioverted for atrial fibrillation.  No headache but she did strike the occiput of her head and so she came here for head imaging.  No chest pain, shortness of breath, recent illness.  Still feels a little lightheaded.  She does not feel like she is currently in atrial fibrillation and has not had palpitations today/yesterday.  Home Medications Prior to Admission medications   Medication Sig Start Date End Date Taking? Authorizing Provider  Ascorbic Acid (VITAMIN C PO) Take 1 tablet by mouth at bedtime.    [provider]  atorvastatin (LIPITOR) 10 MG tablet TAKE ONE TABLET BY MOUTH AT BEDTIME Patient taking differently: Take 10 mg by mouth at bedtime. 03/19/21   Bedsole, Amy E, MD  calcium carbonate (OS-CAL - DOSED IN MG OF ELEMENTAL CALCIUM) 1250 (500 Ca) MG tablet Take 1 tablet by mouth daily with breakfast.    [provider]  ELIQUIS 5 MG TABS tablet TAKE 1 TABLET BY MOUTH TWICE A DAY Patient taking differently: Take 5 mg by mouth 2 (two) times daily. 02/01/21   Allred, Jeneen Rinks, MD  escitalopram (LEXAPRO) 10 MG tablet Take 1 tablet (10 mg total) by mouth daily. 02/27/21   Bedsole, Amy E, MD  flecainide (TAMBOCOR) 100  MG tablet Take 1 tablet (100 mg total) by mouth 2 (two) times daily. 03/19/21   Fenton, Clint R, PA  hydroxypropyl methylcellulose / hypromellose (ISOPTO TEARS / GONIOVISC) 2.5 % ophthalmic solution Place 1 drop into both eyes 2 (two) times daily as needed for dry eyes.    [provider]  linagliptin (TRADJENTA) 5 MG TABS tablet Take 1 tablet (5 mg total) by mouth daily. 02/05/21   [name], MD  metFORMIN (GLUCOPHAGE-XR) 500 MG 24 hr tablet TAKE 2 TABLETS (1,000 MG TOTAL) BY MOUTH 2 (TWO) TIMES DAILY WITH A MEAL. 01/23/21   [name], MD  metoprolol succinate (TOPROL XL) 25 MG 24 hr tablet Take 0.5 tablets (12.5 mg total) by mouth at bedtime. 03/15/21   Fenton, Clint R, PA  MYRBETRIQ 50 MG TB24 tablet Take 50 mg by mouth daily. 11/27/20   [provider]  ONETOUCH ULTRA test strip CHECK BLOOD SUGAR 2 TIMES A DAY AS DIRECTED DX CODE E11.9 08/24/20   [name], MD  traZODone (DESYREL) 50 MG tablet Take 1 tablet (50 mg total) by mouth at bedtime as needed for sleep. 03/19/21   Chesley Mires, MD  trimethoprim (TRIMPEX) 100 MG tablet Take 100 mg by mouth daily.    [provider]  VITAMIN D PO Take 5,000 mg by mouth daily in the afternoon.    [provider]  Allergies    Cortisone, Pravachol [pravastatin sodium], and Contrast media [iodinated contrast media]    Review of Systems   Review of Systems  Respiratory:  Negative for shortness of breath.   Cardiovascular:  Negative for chest pain and palpitations.  Gastrointestinal:  Negative for abdominal pain, diarrhea and vomiting.  Neurological:  Positive for dizziness and light-headedness. Negative for syncope and headaches.   Physical Exam Updated Vital Signs BP 114/88    Pulse (!) 112    Temp 97.8 F (36.6 C)    Resp 14    Ht 5\' 5"  (1.651 m)    Wt 75.8 kg    LMP  (LMP Unknown)    SpO2 100%    BMI 27.79 kg/m  Physical Exam Vitals and nursing note reviewed.  Constitutional:       General: She is not in acute distress.    Appearance: She is well-developed. She is not ill-appearing or diaphoretic.  HENT:     Head: Normocephalic and atraumatic.  Eyes:     Extraocular Movements: Extraocular movements intact.     Pupils: Pupils are equal, round, and reactive to light.  Cardiovascular:     Rate and Rhythm: Regular rhythm. Tachycardia present.     Heart sounds: Normal heart sounds.  Pulmonary:     Effort: Pulmonary effort is normal.     Breath sounds: Normal breath sounds.  Abdominal:     Palpations: Abdomen is soft.     Tenderness: There is no abdominal tenderness.  Skin:    General: Skin is warm and dry.  Neurological:     Mental Status: She is alert.     Comments: CN 3-12 grossly intact. 5/5 strength in all 4 extremities. Grossly normal sensation. Normal finger to nose.     ED Results / Procedures / Treatments   Labs (all labs ordered are listed, but only abnormal results are displayed) Labs Reviewed  BASIC METABOLIC PANEL - Abnormal; Notable for the following components:      Result Value   Sodium 132 (*)    Glucose, Bld 113 (*)    Creatinine, Ser 1.21 (*)    GFR, Estimated 47 (*)    All other components within normal limits  CBC WITH DIFFERENTIAL/PLATELET - Abnormal; Notable for the following components:   Hemoglobin 11.0 (*)    HCT 34.3 (*)    All other components within normal limits  MAGNESIUM    EKG ED ECG REPORT   Date: 04/12/2021  Rate: 108  Rhythm: supraventricular tachycardia (SVT)  QRS Axis: normal  Intervals: normal  ST/T Wave abnormalities: nonspecific T wave changes  Conduction Disutrbances:none  Narrative Interpretation:   Old EKG Reviewed: unchanged  I have personally reviewed the EKG tracing and agree with the computerized printout as noted.   Radiology CT Head Wo Contrast  Result Date: 04/12/2021 CLINICAL DATA:  Head injury after fall.  Dizziness. EXAM: CT HEAD WITHOUT CONTRAST TECHNIQUE: Contiguous axial images were  obtained from the base of the skull through the vertex without intravenous contrast. RADIATION DOSE REDUCTION: This exam was performed according to the departmental dose-optimization program which includes automated exposure control, adjustment of the mA and/or kV according to patient size and/or use of iterative reconstruction technique. COMPARISON:  February 22, 2021. FINDINGS: Brain: No evidence of acute infarction, hemorrhage, hydrocephalus, extra-axial collection or mass lesion/mass effect. Vascular: No hyperdense vessel or unexpected calcification. Skull: Normal. Negative for fracture or focal lesion. Sinuses/Orbits: No acute finding. Other: None. IMPRESSION: No acute intracranial abnormality  seen. Electronically Signed   By: Marijo Conception M.D.   On: 04/12/2021 13:04    Procedures Procedures    Medications Ordered in ED Medications  metoprolol succinate (TOPROL-XL) 24 hr tablet 25 mg (has no administration in time range)  sodium chloride 0.9 % bolus 1,000 mL (1,000 mLs Intravenous New Bag/Given 04/12/21 1402)  metoprolol tartrate (LOPRESSOR) injection 2.5 mg (2.5 mg Intravenous Given 04/12/21 1502)    ED Course/ Medical Decision Making/ A&P                           Medical Decision Making Amount and/or Complexity of Data Reviewed Labs: ordered. Radiology: ordered.  Risk Prescription drug management.   From a head injury perspective, she appears well. No head trauma on exam. Head CT images have been reviewed by myself, no obvious bleeding.  From a lightheadedness perspective, this has been ongoing on and off for a while.  She does appear to have some mild supraventricular tachycardia that is probably A-fib.  She was given a small dose of IV metoprolol without much change.  Given her heart rate still around 110 and she currently feels well, I do not think we have to get the number perfect to get her discharged.  She wants to go home.  When I reviewed her most recent cardiology visit after  her ED visit, they discussed she may need ablation, and I have discussed with her and the husband that she should call cardiology to see if this is needed.  She also had her metoprolol dose reduced recently so I think is reasonable to increase this or at least given extra dose now.  Otherwise she has no chest pain.  Her ECG is as above, no ischemia on my read.  Labs show no significant anemia that would cause symptoms and no AKI. Appears stable for d/c home.   CHA2DS2/VAS Stroke Risk Points  Current as of 8 minutes ago     4 >= 2 Points: High Risk  1 - 1.99 Points: Medium Risk  0 Points: Low Risk    Last Change: N/A      Details    This score determines the patient's risk of having a stroke if the  patient has atrial fibrillation.       Points Metrics  0 Has Congestive Heart Failure:  No    Current as of 8 minutes ago  0 Has Vascular Disease:  No    Current as of 8 minutes ago  1 Has Hypertension:  Yes    Current as of 8 minutes ago  1 Age:  85    Current as of 8 minutes ago  1 Has Diabetes:  Yes    Current as of 8 minutes ago  0 Had Stroke:  No  Had TIA:  No  Had Thromboembolism:  No    Current as of 8 minutes ago  1 Female:  Yes    Current as of 8 minutes ago                  Final Clinical Impression(s) / ED Diagnoses Final diagnoses:  Fall, initial encounter  Minor head injury, initial encounter  Paroxysmal atrial fibrillation [facility])    Rx / DC Orders ED Discharge Orders     None         Sherwood Gambler, MD 04/12/21 1534

## 2021-04-12 NOTE — ED Notes (Signed)
Please discard all notes from Epic user LS. Error in charting, incorrect patient

## 2021-04-12 NOTE — Telephone Encounter (Signed)
Linq 2 transmission received at 0552am.   Presenting rate- Tachy 140s

## 2021-04-12 NOTE — Discharge Instructions (Signed)
If you develop a headache, vision changes, vomiting, chest pain, palpitations, dizziness or lightheadedness, or any other new/concerning symptoms then return to the ER for evaluation.  Otherwise, follow-up closely with your cardiologist for your recurrent atrial fibrillation.  Call them tomorrow as they may want to adjust your medicines to control this.

## 2021-04-13 ENCOUNTER — Telehealth: Payer: Self-pay | Admitting: Cardiovascular Disease

## 2021-04-13 DIAGNOSIS — R001 Bradycardia, unspecified: Secondary | ICD-10-CM | POA: Diagnosis not present

## 2021-04-13 NOTE — Telephone Encounter (Signed)
I will need to send this to pre op provider for review

## 2021-04-13 NOTE — Telephone Encounter (Signed)
Pt has already been cleared for surgery but has since had a fall... please advise if pt is still cleared.[name]

## 2021-04-13 NOTE — Telephone Encounter (Signed)
I have called and spoke with the patient.  At this point it really depends on whether she is still in atrial fibrillation or not.  She is currently scheduled to see Dr. Caryl Comes next Monday.  If she has self converted back to sinus rhythm, Dr. Caryl Comes may still be okay with her proceeding with peripheral nerve evaluation next Friday by holding blood thinner for 3 days prior to that point.  However if she is still begin atrial fibrillation, Dr. Caryl Comes may discuss options with her to bring her out of A-fib and that will delay the procedure.  Fortunately it appears that her procedure is not urgent.

## 2021-04-16 ENCOUNTER — Ambulatory Visit: Payer: Medicare HMO | Admitting: Internal Medicine

## 2021-04-16 ENCOUNTER — Encounter: Payer: Self-pay | Admitting: Internal Medicine

## 2021-04-16 ENCOUNTER — Other Ambulatory Visit: Payer: Self-pay

## 2021-04-16 ENCOUNTER — Ambulatory Visit (INDEPENDENT_AMBULATORY_CARE_PROVIDER_SITE_OTHER): Payer: Medicare HMO

## 2021-04-16 VITALS — BP 146/70 | HR 63 | Ht 65.0 in | Wt 172.0 lb

## 2021-04-16 DIAGNOSIS — R296 Repeated falls: Secondary | ICD-10-CM

## 2021-04-16 DIAGNOSIS — I480 Paroxysmal atrial fibrillation: Secondary | ICD-10-CM | POA: Diagnosis not present

## 2021-04-16 DIAGNOSIS — I4819 Other persistent atrial fibrillation: Secondary | ICD-10-CM

## 2021-04-16 DIAGNOSIS — I471 Supraventricular tachycardia: Secondary | ICD-10-CM

## 2021-04-16 MED ORDER — MAGNESIUM OXIDE 400 MG PO TABS: 400.0000 mg | ORAL_TABLET | Freq: Every day | ORAL | 0 refills | Status: DC

## 2021-04-16 NOTE — Patient Instructions (Signed)
Medication Instructions:  Your physician has recommended you make the following change in your medication:   ** Stop Flecainide  ** Begin Magnesium Oxide 400mg  - 1 tablet by mouth daily  *If you need a refill on your cardiac medications before your next appointment, please call your pharmacy*   Lab Work: None ordered.  If you have labs (blood work) drawn today and your tests are completely normal, you will receive your results only by: [facility] (if you have MyChart) OR A paper copy in the mail If you have any lab test that is abnormal or we need to change your treatment, we will call you to review the results.   Testing/Procedures: None ordered.    Follow-Up: At [facility], you and your health needs are our priority.  As part of our continuing mission to provide you with exceptional heart care, we have created designated Provider Care Teams.  These Care Teams include your primary Cardiologist (physician) and Advanced Practice Providers (APPs -  Physician Assistants and Nurse Practitioners) who all work together to provide you with the care you need, when you need it.  We recommend signing up for the patient portal called "MyChart".  Sign up information is provided on this After Visit Summary.  MyChart is used to connect with patients for Virtual Visits (Telemedicine).  Patients are able to view lab/test results, encounter notes, upcoming appointments, etc.  Non-urgent messages can be sent to your provider as well.   To learn more about what you can do with MyChart, go to [email or web address].    Your next appointment:    Follow up to be determined.  Will be admitted for Tikosyn

## 2021-04-16 NOTE — Progress Notes (Signed)
Patient Care Team: Jinny Sanders, MD as PCP - General (Family Medicine) [name], MD as PCP - Cardiology (Cardiology) Thompson Grayer, MD as PCP - Electrophysiology (Cardiology) [name], MD as Consulting Physician (Internal Medicine) [name], MD as Consulting Physician (Cardiology) Wilhelmina Mcardle, MD (Inactive) as Consulting Physician (Pulmonary Disease) Jinny Sanders, MD Dannielle Karvonen, RN as Case Manager   HPI  [name] is a 76 y.o. female in today having seen Dr. Lovena Le 1/23And previously frequently and previously frequently by Dr. Rayann Heman.  PVI/CTI 10/18 she has a history of atrial fibrillation for which she has been treated with flecainide 100 twice daily with anticoagulation with Eliquis Thereafter, she developed palpitations.  Was seen in the emergency room and underwent cardioversion. Because of falls was given an event recorder>> recurrent falls and ended up in the ER 2/23  She has had in the past falls associated with atrial fibrillation onset.  More recently she has had falls unassociated with atrial fibrillation.  She is also an atrial tachycardia is is identified by RU-PA for which her loop recorder was reprogrammed; she has had multiple episodes of tachycardia with a ventricular rate about 130-140, regular suggestive of atrial flutters, perhaps post ablation.  On the day of her fall, her heart rate on arrival was about 110 without distinct P waves.  She has lightheadedness with standing, the episode that prompted her hospitalization was a fall off the commode.  Some of her episodes occur on the commode but mostly not.  No bleeding.  Currently undergoing evaluation by Dr. Carles Collet for dizziness.   DATE PR interval QRSduration Dose  11/16  176 86 0  1/23 238 96 100   DATE TEST EF   2/22 Echo   55-60 %                 Records and Results Reviewed   Past Medical History:  Diagnosis Date   Anemia    Anxiety    Atrial  fibrillation (HCC)    Colon polyps    Constipation    Depressive disorder, not elsewhere classified    Diabetes mellitus without complication ([place])    Diaphragmatic hernia without mention of obstruction or gangrene    Disorders of bursae and tendons in shoulder region, unspecified    Diverticulosis of colon (without mention of hemorrhage)    External hemorrhoid    Fundic gland polyps of stomach, benign    GERD (gastroesophageal reflux disease)    History of hiatal hernia    HTN (hypertension)    Hypertension    Hypothyroidism    Internal hemorrhoids without mention of complication    Moderate mitral regurgitation    Obesity, unspecified    OSA on CPAP    compliant with CPAP   Other urinary incontinence    Persistent atrial fibrillation ([place])    Pure hypercholesterolemia    Type II diabetes mellitus ([place])    Urinary frequency    Urinary urgency    Vitamin B 12 deficiency     Past Surgical History:  Procedure Laterality Date   ABDOMINAL HYSTERECTOMY     ABDOMINAL HYSTERECTOMY  1972   Have both ovaries   ATRIAL FIBRILLATION ABLATION  12/17/2016   ATRIAL FIBRILLATION ABLATION N/A 12/17/2016   Procedure: ATRIAL FIBRILLATION ABLATION;  Surgeon: Thompson Grayer, MD;  Location: [facility];  Service: Cardiovascular;  Laterality: N/A;   [place]   [place]; "Dr.  Little"   CARDIOVERSION N/A 05/27/2016   Procedure: CARDIOVERSION;  Surgeon: [name], MD;  Location: [facility];  Service: Cardiovascular;  Laterality: N/A;   CARDIOVERSION N/A 11/25/2016   Procedure: CARDIOVERSION;  Surgeon: [name], MD;  Location: [facility] OR[facility];  Service: Cardiovascular;  Laterality: N/A;   CARDIOVERSION N/A 09/18/2018   Procedure: CARDIOVERSION;  Surgeon: [name], MD;  Location: [facility];  Service: Cardiovascular;  Laterality: N/A;   CARDIOVERSION N/A 07/28/2019   Procedure: CARDIOVERSION;  Surgeon: Kate Sable, MD;  Location: [facility];  Service:  Cardiovascular;  Laterality: N/A;   CARDIOVERSION N/A 09/07/2019   Procedure: CARDIOVERSION;  Surgeon: Nelva Bush, MD;  Location: [facility] OR[facility];  Service: Cardiovascular;  Laterality: N/A;   CATARACT EXTRACTION W/ INTRAOCULAR LENS  IMPLANT, BILATERAL Bilateral    COLONOSCOPY     ELECTROPHYSIOLOGIC STUDY N/A 01/24/2016   Procedure: CARDIOVERSION;  Surgeon: [name], MD;  Location: [facility];  Service: Cardiovascular;  Laterality: N/A;   implantable loop recorder implantation  10/18/2019   [employer] Reveal [place] model [id number]) implantable loop recorder    LAPAROSCOPIC CHOLECYSTECTOMY     REDUCTION MAMMAPLASTY Bilateral    TONSILLECTOMY     TOTAL HIP ARTHROPLASTY Right 05/12/2018   Procedure: TOTAL HIP ARTHROPLASTY ANTERIOR APPROACH;  Surgeon: Paralee Cancel, MD;  Location: [facility];  Service: Orthopedics;  Laterality: Right;  70 mins    Current Meds  Medication Sig   Ascorbic Acid (VITAMIN C PO) Take 1 tablet by mouth at bedtime.   atorvastatin (LIPITOR) 10 MG tablet TAKE ONE TABLET BY MOUTH AT BEDTIME (Patient taking differently: Take 10 mg by mouth at bedtime.)   calcium carbonate (OS-CAL - DOSED IN MG OF ELEMENTAL CALCIUM) 1250 (500 Ca) MG tablet Take 1 tablet by mouth daily with breakfast.   ELIQUIS 5 MG TABS tablet TAKE 1 TABLET BY MOUTH TWICE A DAY (Patient taking differently: Take 5 mg by mouth 2 (two) times daily.)   escitalopram (LEXAPRO) 10 MG tablet Take 1 tablet (10 mg total) by mouth daily.   flecainide (TAMBOCOR) 100 MG tablet Take 1 tablet (100 mg total) by mouth 2 (two) times daily.   hydroxypropyl methylcellulose / hypromellose (ISOPTO TEARS / GONIOVISC) 2.5 % ophthalmic solution Place 1 drop into both eyes 2 (two) times daily as needed for dry eyes.   linagliptin (TRADJENTA) 5 MG TABS tablet Take 1 tablet (5 mg total) by mouth daily.   metFORMIN (GLUCOPHAGE-XR) 500 MG 24 hr tablet TAKE 2 TABLETS (1,000 MG TOTAL) BY MOUTH 2 (TWO) TIMES DAILY WITH A MEAL.    metoprolol succinate (TOPROL XL) 25 MG 24 hr tablet Take 0.5 tablets (12.5 mg total) by mouth at bedtime.   MYRBETRIQ 50 MG TB24 tablet Take 50 mg by mouth daily.   ONETOUCH ULTRA test strip CHECK BLOOD SUGAR 2 TIMES A DAY AS DIRECTED DX CODE E11.9   traZODone (DESYREL) 50 MG tablet Take 1 tablet (50 mg total) by mouth at bedtime as needed for sleep.   trimethoprim (TRIMPEX) 100 MG tablet Take 100 mg by mouth daily.   VITAMIN D PO Take 5,000 mg by mouth daily in the afternoon.    Allergies  Allergen Reactions   Cortisone Shortness Of Breath and Rash   Pravachol [Pravastatin Sodium]     Leg pain    Contrast Media [Iodinated Contrast Media] Rash      Review of Systems negative except from HPI and PMH  Physical Exam BP (!) 146/70  Pulse 63    Ht 5\' 5"  (1.651 m)    Wt 172 lb (78 kg)    LMP  (LMP Unknown)    SpO2 97%    BMI 28.62 kg/m  Well developed and well nourished in no acute distress HENT normal E scleral and icterus clear Neck Supple JVP flat; carotids brisk and full Clear to ausculation Regular rate and rhythm, no murmurs gallops or rub Soft with active bowel sounds No clubbing cyanosis  Edema Alert and oriented, grossly normal motor and sensory function Skin Warm and Dry  ECG sinus at 60 24/11/42  Estimated Creatinine Clearance: 42.1 mL/min (A) (by C-G formula based on SCr of 1.21 mg/dL (H)).   Assessment and  Plan  Atrial flutter-multiple variable ventricular responses  Falls/dizziness question mechanism  Dizziness   The patient has multiple atrial arrhythmias.  There are some that are irregular some that are regular.  It is hard to tell whether it is variable block or not.  The rates are multiple suggesting various's flutter circuits.  Flecainide is clearly inadequate and her rates are slow in sinus making amiodarone a less good option.  We have discussed the use of dofetilide and we will plan to discontinue the flecainide and admit for the latter.  Her last  potassium was 4.9 QTc was under 400 ms magnesium was 1.7 so we will begin her on mag oxide 400 daily  No bleeding.  Continue apixaban  She is post have bladder procedure because of incontinence.  We will have to defer this.    Current medicines are reviewed at length with the patient today .  The patient does not  have concerns regarding medicines.

## 2021-04-17 ENCOUNTER — Ambulatory Visit (INDEPENDENT_AMBULATORY_CARE_PROVIDER_SITE_OTHER): Payer: Medicare HMO

## 2021-04-17 ENCOUNTER — Telehealth: Payer: Self-pay | Admitting: Pharmacist

## 2021-04-17 DIAGNOSIS — I4819 Other persistent atrial fibrillation: Secondary | ICD-10-CM

## 2021-04-17 DIAGNOSIS — E119 Type 2 diabetes mellitus without complications: Secondary | ICD-10-CM

## 2021-04-17 DIAGNOSIS — I152 Hypertension secondary to endocrine disorders: Secondary | ICD-10-CM

## 2021-04-17 DIAGNOSIS — R296 Repeated falls: Secondary | ICD-10-CM

## 2021-04-17 LAB — CUP PACEART REMOTE DEVICE CHECK
Date Time Interrogation Session: 20230212231230
Implantable Pulse Generator Implant Date: 20210816

## 2021-04-17 NOTE — Telephone Encounter (Signed)
Patient notified of recommendations to discuss with PCP regarding Trimethorpim, lexapro and trazodone replacements. Pt will call back with update.

## 2021-04-17 NOTE — Patient Instructions (Signed)
Visit Information  Thank you for taking time to visit with me today. Please don't hesitate to contact me if I can be of assistance to you before our next scheduled telephone appointment.  Following are the goals we discussed today:  Take medications as prescribed   Attend all scheduled provider appointments Call pharmacy for medication refills 3-7 days in advance of running out of medications Call provider office for new concerns or questions  Continue to check blood pressure 2- 3 times per week and record Continue to follow a low salt diet Follow up with equipment provider regarding CPAP repair Continue to check blood sugars as recommended by your provider ( take blood sugar diary to your provider visit) Follow Rule of 15 when managing low blood sugars:   RULE OF 15 How to treat low blood sugars (Blood sugar less than 70 mg/dl  Please follow the RULE OF 15 for the treatment of hypoglycemia treatment (When your blood sugars are less than 70 mg/ dl) STEP  1:  Take 15 grams of carbohydrates when your blood sugar is low, which includes:   3-4 glucose tabs or  3-4 oz of juice or regular soda or  One tube of glucose gel STEP 2:  Recheck blood sugar in 15 minutes STEP 3:  If your blood sugar is still low at the 15 minute recheck ---then, go back to STEP 1 and treat again with another 15 grams of carbohydrates Call your provider if you have a racing or irregular heartbeat that may be uncomfortable, shortness of breath with or without chest pain, weakness and dizziness.  Call 911 for severe symptoms such as chest pain, fainting and struggling to breath.  Take blood thinner as prescribed ( notify provider for signs/ symptoms of bleeding: teeth/ gums, in urine, and/ or excessive bruising)   Our next appointment is by telephone on 05/08/2021 at 11:00 am  Please call the care guide team at [phone] if you need to cancel or reschedule your appointment.   If you are experiencing a Mental Health or  [place] or need someone to talk to, please call the Suicide and Crisis Lifeline: 988 call 1-800-273-TALK (toll free, 24 hour hotline)   Patient verbalizes understanding of instructions and care plan provided today and agrees to view in [place]. Active MyChart status confirmed with patient.    Quinn Plowman RN,BSN,CCM RN Case Manager [place]  [phone]

## 2021-04-17 NOTE — Addendum Note (Signed)
Addended by: Janan Halter F on: 04/17/2021 04:11 PM   Modules accepted: Orders

## 2021-04-17 NOTE — Chronic Care Management (AMB) (Signed)
Chronic Care Management   CCM RN Visit Note  04/17/2021 Name: [name] MRN: [id number] DOB: September 17, 1945  Subjective: [name] is a 76 y.o. year old female who is a primary care patient of Bedsole, Amy E, MD. The care management team was consulted for assistance with disease management and care coordination needs.    Engaged with patient by telephone for follow up visit in response to provider referral for case management and/or care coordination services.   Consent to Services:  The patient was given information about Chronic Care Management services, agreed to services, and gave verbal consent prior to initiation of services.  Please see initial visit note for detailed documentation.   Patient agreed to services and verbal consent obtained.   Assessment: Review of patient past medical history, allergies, medications, health status, including review of consultants reports, laboratory and other test data, was performed as part of comprehensive evaluation and provision of chronic care management services.   SDOH (Social Determinants of Health) assessments and interventions performed:    CCM Care Plan  Allergies  Allergen Reactions   Cortisone Shortness Of Breath and Rash   Pravachol [Pravastatin Sodium]     Leg pain    Contrast Media [Iodinated Contrast Media] Rash    Outpatient Encounter Medications as of 04/17/2021  Medication Sig   Ascorbic Acid (VITAMIN C PO) Take 1 tablet by mouth at bedtime.   atorvastatin (LIPITOR) 10 MG tablet TAKE ONE TABLET BY MOUTH AT BEDTIME (Patient taking differently: Take 10 mg by mouth at bedtime.)   calcium carbonate (OS-CAL - DOSED IN MG OF ELEMENTAL CALCIUM) 1250 (500 Ca) MG tablet Take 1 tablet by mouth daily with breakfast.   ELIQUIS 5 MG TABS tablet TAKE 1 TABLET BY MOUTH TWICE A DAY (Patient taking differently: Take 5 mg by mouth 2 (two) times daily.)   escitalopram (LEXAPRO) 10 MG tablet Take 1 tablet (10 mg total) by mouth daily.    hydroxypropyl methylcellulose / hypromellose (ISOPTO TEARS / GONIOVISC) 2.5 % ophthalmic solution Place 1 drop into both eyes 2 (two) times daily as needed for dry eyes.   linagliptin (TRADJENTA) 5 MG TABS tablet Take 1 tablet (5 mg total) by mouth daily.   magnesium oxide (MAG-OX) 400 MG tablet Take 1 tablet (400 mg total) by mouth daily.   metFORMIN (GLUCOPHAGE-XR) 500 MG 24 hr tablet TAKE 2 TABLETS (1,000 MG TOTAL) BY MOUTH 2 (TWO) TIMES DAILY WITH A MEAL.   metoprolol succinate (TOPROL XL) 25 MG 24 hr tablet Take 0.5 tablets (12.5 mg total) by mouth at bedtime.   MYRBETRIQ 50 MG TB24 tablet Take 50 mg by mouth daily.   traZODone (DESYREL) 50 MG tablet Take 1 tablet (50 mg total) by mouth at bedtime as needed for sleep.   trimethoprim (TRIMPEX) 100 MG tablet Take 100 mg by mouth daily.   VITAMIN D PO Take 5,000 mg by mouth daily in the afternoon.   ONETOUCH ULTRA test strip CHECK BLOOD SUGAR 2 TIMES A DAY AS DIRECTED DX CODE E11.9   No facility-administered encounter medications on file as of 04/17/2021.    Patient Active Problem List   Diagnosis Date Noted   Dizziness 02/20/2021   Intractable acute post-traumatic headache 02/20/2021   Secondary hypercoagulable state ([place]) 11/27/2020   Cellulitis of left lower extremity 10/20/2020   Closed fracture of base of fifth metatarsal bone, left, initial encounter 07/26/2020   Ear pain, left 07/21/2019   Atrial tachycardia ([place]) 09/15/2018   S/P right  THA 05/12/2018   Status post total hip replacement, 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   Lumbar radiculopathy 01/05/2018   Vitamin B 12 deficiency    Urge incontinence    OSA on CPAP    History of hiatal hernia    GERD (gastroesophageal reflux disease)    Anxiety    Other constipation 01/02/2017   Class 1 obesity with serious comorbidity and body mass index (BMI) of 30.0 to 30.9 in adult 01/02/2017   Overweight (BMI 25.0-29.9) 12/19/2016   Persistent atrial fibrillation ([place]) 12/17/2016   Vitamin D  deficiency 11/20/2016   Family history of ovarian cancer 05/24/2016   Iron deficiency anemia    Hypertension associated with diabetes ([place])    Preoperative evaluation to rule out surgical contraindication 05/16/2015   Controlled diabetes mellitus type II without complication ([place]) 54/65/0354   Memory loss 05/13/2014   Mitral regurgitation 10/11/2013   Incidental lung nodule 07/16/2011   SCIATICA, RIGHT 04/10/2010   Hyperlipidemia associated with type 2 diabetes mellitus ([place]) 06/11/2006   MDD (major depressive disorder), recurrent episode, mild ([place]) 06/11/2006   HEMORRHOIDS, INTERNAL 06/11/2006   DIVERTICULOSIS, COLON 06/11/2006   STRESS INCONTINENCE 06/11/2006    Conditions to be addressed/monitored:Atrial Fibrillation, HTN, DMII, and falls  Care Plan : [facility] Plan of care  Updates made by Dannielle Karvonen, RN since 04/17/2021 12:00 AM     Problem: Chronic disease management , education, and/ or care coordination needs.   Priority: High     Long-Range Goal: Development of plan of care to address chronic disease manaement and / or care coordination needs   Start Date: 01/23/2021  Expected End Date: 05/01/2021  Priority: High  Note:   Current Barriers:  Chronic Disease Management support and education needs related to Atrial Fibrillation, HTN, and DMII, falls Patient reports she has had ongoing issues with Atrial fibrillation and palpitation.  She reports having a follow up visit with her cardiologist on yesterday 04/16/2021.  She states the cardiologist discontinued her Flecaninide and will admit her to the hospital to receive Tikosyn.  Patient states she is awaiting notification of admit date.  Patient reports having ongoing falls and dizziness with most recent fall being on 05/10/2021 with no serious injury .  She states she was evaluated by a neurologist on 03/13/21 due to the ongoing falls.  She reports having a MRI and CT scan. Unsure of results.  She states she is scheduled to have  another scan with contrast on 04/24/21.  Patient states she was advised by pulmonology to start  wearing her CPAP machine again.  She states she has not been able to do this due to the equipment leaking air. She suggest taking the CPAP machine back to the medical equipment company to have the leak assessed.  Patient reports most recent Hgb A1c was 6.9.  She states she continues to monitor her blood sugars 2 times per day and reports her blood sugars range from 80- 150.   RNCM Clinical Goal(s):  Patient will verbalize understanding of plan for management of Atrial Fibrillation, HTN, and DMII take all medications exactly as prescribed and will call provider for medication related questions attend all scheduled medical appointments:  continue to work with RN Care Manager to address care management and care coordination needs related to  Atrial Fibrillation, HTN, and DMII through collaboration with RN Care manager, provider, and care team.   Interventions: 1:1 collaboration with primary care provider regarding development and update of comprehensive plan of care as evidenced by provider  attestation and co-signature Inter-disciplinary care team collaboration (see longitudinal plan of care) Evaluation of current treatment plan related to  self management and patient's adherence to plan as established by provider  Hypertension Interventions: Goal on track: yes Last practice recorded BP readings:  BP Readings from Last 3 Encounters:  04/16/21 (!) 146/70  04/12/21 (!) 153/94  03/26/21 (!) 128/58  Most recent eGFR/CrCl:  Lab Results  Component Value Date   EGFR 60 05/25/2020    No components found for: CRCL  Evaluation of current treatment plan related to hypertension self management and patient's adherence to plan as established by provider; Reviewed medications with patient and discussed importance of compliance; Discussed plans with patient for ongoing care management follow up and provided  patient with direct contact information for care management team; Reviewed scheduled/upcoming provider appointments including:  Advised patient, providing education and rationale to continue to monitor blood pressure and record, calling PCP for findings outside of established parameters  Diabetes Interventions: Goal on track: Yes Assessed patient's understanding of A1c goal: <7% Reviewed medications with patient and discussed importance of medication adherence; Discussed plans with patient for ongoing care management follow up and provided patient with direct contact information for care management team; Reviewed scheduled/upcoming provider appointments including:  ; Advised patient to continue to monitor blood sugars daily and notify her doctor for findings outside established parameters Review of patient status, including review of consultants reports, relevant laboratory and other test results, and medications completed.  Lab Results  Component Value Date   HGBA1C 6.9 (A) 03/23/2021  AFIB Interventions: New Goal  Reviewed importance of adherence to anticoagulant exactly as prescribed; Counseled on bleeding risk associated with blood thinning medications and importance of self-monitoring for signs/symptoms of bleeding; Counseled on avoidance of NSAIDs due to increased bleeding risk with anticoagulants;   Falls Interventions:  (Status:  New goal.) Long Term Goal Reviewed medications and discussed potential side effects of medications such as dizziness and frequent urination Advised patient of importance of notifying provider of falls Assessed patients knowledge of fall risk prevention secondary to previously provided education   Patient Goals/Self-Care Activities: Take medications as prescribed   Attend all scheduled provider appointments Call pharmacy for medication refills 3-7 days in advance of running out of medications Call provider office for new concerns or questions  Continue to  check blood pressure 2- 3 times per week and record Continue to follow a low salt diet Follow up with equipment provider regarding CPAP repair Continue to check blood sugars as recommended by your provider ( take blood sugar diary to your provider visit) Follow Rule of 15 when managing low blood sugars:   RULE OF 15 How to treat low blood sugars (Blood sugar less than 70 mg/dl  Please follow the RULE OF 15 for the treatment of hypoglycemia treatment (When your blood sugars are less than 70 mg/ dl) STEP  1:  Take 15 grams of carbohydrates when your blood sugar is low, which includes:   3-4 glucose tabs or  3-4 oz of juice or regular soda or  One tube of glucose gel STEP 2:  Recheck blood sugar in 15 minutes STEP 3:  If your blood sugar is still low at the 15 minute recheck ---then, go back to STEP 1 and treat again with another 15 grams of carbohydrates Call your provider if you have a racing or irregular heartbeat that may be uncomfortable, shortness of breath with or without chest pain, weakness and dizziness.  Call 911 for  severe symptoms such as chest pain, fainting and struggling to breath.  Take blood thinner as prescribed ( notify provider for signs/ symptoms of bleeding: teeth/ gums, in urine, and/ or excessive bruising)         Plan:The patient has been provided with contact information for the care management team and has been advised to call with any health related questions or concerns.  The care management team will reach out to the patient again over the next 30 days. Quinn Plowman RN,BSN,CCM RN Case Manager [place]  [phone]

## 2021-04-17 NOTE — Telephone Encounter (Signed)
Medication list reviewed in anticipation of upcoming Tikosyn initiation. Patient is taking trimethoprim which is contraindicated with Tikosyn and will need to be discontinued. She also takes escitalopram which is more QTc prolonging than other SSRIs. Recommend she reach out to PCP to see if alternative agent like SNRI could be used instead to avoid drug interaction. She also takes trazodone which is QTc prolonging. This seems to be prn for sleep, would recommend discontinuing. Metformin also increases concentrations of Tikosyn but drug interaction is not severe, this can be continued. Her flecainide was discontinued yesterday.  Patient is anticoagulated on Eliquis 5mg  BID on the appropriate dose. Please ensure that patient has not missed any anticoagulation doses in the 3 weeks prior to Tikosyn initiation.   Patient will need to be counseled to avoid use of Benadryl while on Tikosyn and in the 2-3 days prior to Tikosyn initiation.

## 2021-04-18 ENCOUNTER — Encounter: Payer: Self-pay | Admitting: Family Medicine

## 2021-04-18 NOTE — Telephone Encounter (Signed)
I am being put into the hospital by cardiologists Dr Caryl Comes to start medication Tikosyn.  He has said that I need to stop Trazadone.  Can I do this all at once or do I need to scale back slowly? Thank you, [name] [phone]          Dr. Halford Chessman, please advise. Thanks

## 2021-04-20 NOTE — Progress Notes (Signed)
Carelink Summary Report / Loop Recorder 

## 2021-04-24 ENCOUNTER — Encounter (HOSPITAL_COMMUNITY)
Admission: RE | Admit: 2021-04-24 | Discharge: 2021-04-24 | Disposition: A | Discharge status: Home / Self Care | Payer: Medicare HMO | Source: Ambulatory Visit | Attending: Neurology | Admitting: Neurology

## 2021-04-24 ENCOUNTER — Other Ambulatory Visit: Payer: Self-pay

## 2021-04-24 ENCOUNTER — Other Ambulatory Visit (HOSPITAL_COMMUNITY): Payer: Medicare HMO

## 2021-04-24 DIAGNOSIS — R251 Tremor, unspecified: Secondary | ICD-10-CM | POA: Diagnosis not present

## 2021-04-24 DIAGNOSIS — G20 Parkinson's disease: Secondary | ICD-10-CM | POA: Diagnosis not present

## 2021-04-24 DIAGNOSIS — R42 Dizziness and giddiness: Secondary | ICD-10-CM | POA: Diagnosis not present

## 2021-04-24 MED ORDER — IOFLUPANE I 123 185 MBQ/2.5ML IV SOLN
4.6000 | Freq: Once | INTRAVENOUS | Status: AC | PRN
Start: 2021-04-24 — End: 2021-04-24
  Administered 2021-04-24: 4.6 via INTRAVENOUS
  Filled 2021-04-24: qty 5

## 2021-04-24 MED ORDER — POTASSIUM IODIDE (ANTIDOTE) 130 MG PO TABS: 130.0000 mg | ORAL_TABLET | Freq: Once | ORAL | Status: DC

## 2021-04-24 MED ORDER — POTASSIUM IODIDE (ANTIDOTE) 130 MG PO TABS
ORAL_TABLET | ORAL | Status: AC
  Administered 2021-04-24: 130 mg via ORAL
  Filled 2021-04-24: qty 1

## 2021-05-01 DIAGNOSIS — I10 Essential (primary) hypertension: Secondary | ICD-10-CM

## 2021-05-01 DIAGNOSIS — E1159 Type 2 diabetes mellitus with other circulatory complications: Secondary | ICD-10-CM | POA: Diagnosis not present

## 2021-05-01 DIAGNOSIS — I4891 Unspecified atrial fibrillation: Secondary | ICD-10-CM

## 2021-05-01 DIAGNOSIS — Z7984 Long term (current) use of oral hypoglycemic drugs: Secondary | ICD-10-CM | POA: Diagnosis not present

## 2021-05-04 ENCOUNTER — Other Ambulatory Visit (HOSPITAL_COMMUNITY): Payer: Self-pay | Admitting: Physician Assistant

## 2021-05-04 LAB — SARS CORONAVIRUS 2 (TAT 6-24 HRS): SARS Coronavirus 2: NEGATIVE

## 2021-05-07 ENCOUNTER — Encounter: Payer: Medicare HMO | Admitting: Internal Medicine

## 2021-05-08 ENCOUNTER — Other Ambulatory Visit: Payer: Self-pay

## 2021-05-08 ENCOUNTER — Ambulatory Visit (HOSPITAL_COMMUNITY)
Admission: RE | Admit: 2021-05-08 | Discharge: 2021-05-08 | Disposition: A | Discharge status: Home / Self Care | Payer: Medicare HMO | Source: Ambulatory Visit | Attending: Physician Assistant | Admitting: Physician Assistant

## 2021-05-08 ENCOUNTER — Telehealth: Payer: Medicare HMO

## 2021-05-08 ENCOUNTER — Other Ambulatory Visit (HOSPITAL_COMMUNITY): Payer: Self-pay

## 2021-05-08 ENCOUNTER — Inpatient Hospital Stay (HOSPITAL_COMMUNITY)
Admission: AD | Admit: 2021-05-08 | Discharge: 2021-05-11 | DRG: 309 | Disposition: A | Discharge status: Home / Self Care | Payer: Medicare HMO | Source: Ambulatory Visit | Attending: Internal Medicine | Admitting: Internal Medicine

## 2021-05-08 ENCOUNTER — Telehealth: Payer: Self-pay

## 2021-05-08 VITALS — BP 156/68 | HR 74 | Ht 65.0 in | Wt 167.4 lb

## 2021-05-08 DIAGNOSIS — D6869 Other thrombophilia: Secondary | ICD-10-CM | POA: Diagnosis not present

## 2021-05-08 DIAGNOSIS — E538 Deficiency of other specified B group vitamins: Secondary | ICD-10-CM | POA: Diagnosis present

## 2021-05-08 DIAGNOSIS — G4733 Obstructive sleep apnea (adult) (pediatric): Secondary | ICD-10-CM | POA: Diagnosis not present

## 2021-05-08 DIAGNOSIS — I10 Essential (primary) hypertension: Secondary | ICD-10-CM | POA: Diagnosis not present

## 2021-05-08 DIAGNOSIS — Z888 Allergy status to other drugs, medicaments and biological substances status: Secondary | ICD-10-CM | POA: Diagnosis not present

## 2021-05-08 DIAGNOSIS — Z8249 Family history of ischemic heart disease and other diseases of the circulatory system: Secondary | ICD-10-CM | POA: Diagnosis not present

## 2021-05-08 DIAGNOSIS — F32A Depression, unspecified: Secondary | ICD-10-CM | POA: Diagnosis present

## 2021-05-08 DIAGNOSIS — R69 Illness, unspecified: Secondary | ICD-10-CM | POA: Diagnosis not present

## 2021-05-08 DIAGNOSIS — Z7901 Long term (current) use of anticoagulants: Secondary | ICD-10-CM | POA: Diagnosis not present

## 2021-05-08 DIAGNOSIS — Z7984 Long term (current) use of oral hypoglycemic drugs: Secondary | ICD-10-CM | POA: Diagnosis not present

## 2021-05-08 DIAGNOSIS — F419 Anxiety disorder, unspecified: Secondary | ICD-10-CM | POA: Diagnosis present

## 2021-05-08 DIAGNOSIS — I4819 Other persistent atrial fibrillation: Secondary | ICD-10-CM

## 2021-05-08 DIAGNOSIS — Z79899 Other long term (current) drug therapy: Secondary | ICD-10-CM | POA: Diagnosis not present

## 2021-05-08 DIAGNOSIS — Z86010 Personal history of colonic polyps: Secondary | ICD-10-CM | POA: Diagnosis not present

## 2021-05-08 DIAGNOSIS — K219 Gastro-esophageal reflux disease without esophagitis: Secondary | ICD-10-CM | POA: Diagnosis present

## 2021-05-08 DIAGNOSIS — Z833 Family history of diabetes mellitus: Secondary | ICD-10-CM

## 2021-05-08 DIAGNOSIS — Z91041 Radiographic dye allergy status: Secondary | ICD-10-CM | POA: Diagnosis not present

## 2021-05-08 DIAGNOSIS — Z90710 Acquired absence of both cervix and uterus: Secondary | ICD-10-CM

## 2021-05-08 DIAGNOSIS — E039 Hypothyroidism, unspecified: Secondary | ICD-10-CM | POA: Diagnosis present

## 2021-05-08 DIAGNOSIS — Z83438 Family history of other disorder of lipoprotein metabolism and other lipidemia: Secondary | ICD-10-CM

## 2021-05-08 DIAGNOSIS — E7800 Pure hypercholesterolemia, unspecified: Secondary | ICD-10-CM | POA: Diagnosis not present

## 2021-05-08 DIAGNOSIS — I340 Nonrheumatic mitral (valve) insufficiency: Secondary | ICD-10-CM | POA: Diagnosis present

## 2021-05-08 DIAGNOSIS — E119 Type 2 diabetes mellitus without complications: Secondary | ICD-10-CM | POA: Diagnosis not present

## 2021-05-08 LAB — MAGNESIUM
Magnesium: 1.7 mg/dL (ref 1.7–2.4)
Magnesium: 2.6 mg/dL — ABNORMAL HIGH (ref 1.7–2.4)

## 2021-05-08 LAB — BASIC METABOLIC PANEL
Anion gap: 8 (ref 5–15)
BUN: 15 mg/dL (ref 8–23)
CO2: 25 mmol/L (ref 22–32)
Calcium: 9.4 mg/dL (ref 8.9–10.3)
Chloride: 103 mmol/L (ref 98–111)
Creatinine, Ser: 1 mg/dL (ref 0.44–1.00)
GFR, Estimated: 59 mL/min — ABNORMAL LOW (ref >60)
Glucose, Bld: 194 mg/dL — ABNORMAL HIGH (ref 70–99)
Potassium: 4.5 mmol/L (ref 3.5–5.1)
Sodium: 136 mmol/L (ref 135–145)

## 2021-05-08 MED ORDER — METFORMIN HCL ER 500 MG PO TB24
1000.0000 mg | ORAL_TABLET | Freq: Two times a day (BID) | ORAL | Status: DC
Start: 2021-05-08 — End: 2021-05-11
  Administered 2021-05-08 – 2021-05-11 (×6): 1000 mg via ORAL
  Filled 2021-05-08 (×7): qty 2

## 2021-05-08 MED ORDER — DOFETILIDE 250 MCG PO CAPS
250.0000 ug | ORAL_CAPSULE | Freq: Two times a day (BID) | ORAL | Status: DC
  Administered 2021-05-08 – 2021-05-09 (×2): 250 ug via ORAL
  Filled 2021-05-08 (×2): qty 1

## 2021-05-08 MED ORDER — APIXABAN 5 MG PO TABS
5.0000 mg | ORAL_TABLET | Freq: Two times a day (BID) | ORAL | Status: DC
  Administered 2021-05-08 – 2021-05-11 (×6): 5 mg via ORAL
  Filled 2021-05-08 (×6): qty 1

## 2021-05-08 MED ORDER — METOPROLOL SUCCINATE ER 25 MG PO TB24: 12.5000 mg | ORAL_TABLET | Freq: Every day | ORAL | Status: DC

## 2021-05-08 MED ORDER — LINAGLIPTIN 5 MG PO TABS
5.0000 mg | ORAL_TABLET | Freq: Every day | ORAL | Status: DC
Start: 2021-05-09 — End: 2021-05-11
  Administered 2021-05-09 – 2021-05-11 (×3): 5 mg via ORAL
  Filled 2021-05-08 (×3): qty 1

## 2021-05-08 MED ORDER — SODIUM CHLORIDE 0.9% FLUSH
3.0000 mL | Freq: Two times a day (BID) | INTRAVENOUS | Status: DC
  Administered 2021-05-08 – 2021-05-11 (×6): 3 mL via INTRAVENOUS

## 2021-05-08 MED ORDER — SODIUM CHLORIDE 0.9 % IV SOLN
250.0000 mL | INTRAVENOUS | Status: DC | PRN
  Administered 2021-05-10: 09:00:00 250 mL via INTRAVENOUS

## 2021-05-08 MED ORDER — ATORVASTATIN CALCIUM 10 MG PO TABS
10.0000 mg | ORAL_TABLET | Freq: Every day | ORAL | Status: DC
  Administered 2021-05-08 – 2021-05-10 (×3): 10 mg via ORAL
  Filled 2021-05-08 (×3): qty 1

## 2021-05-08 MED ORDER — SODIUM CHLORIDE 0.9% FLUSH: 3.0000 mL | INTRAVENOUS | Status: DC | PRN

## 2021-05-08 MED ORDER — MAGNESIUM SULFATE 4 GM/100ML IV SOLN
4.0000 g | Freq: Once | INTRAVENOUS | Status: AC
  Administered 2021-05-08: 4 g via INTRAVENOUS
  Filled 2021-05-08: qty 100

## 2021-05-08 MED ORDER — MAGNESIUM OXIDE -MG SUPPLEMENT 400 (240 MG) MG PO TABS
400.0000 mg | ORAL_TABLET | Freq: Every day | ORAL | Status: DC
  Administered 2021-05-08 – 2021-05-11 (×4): 400 mg via ORAL
  Filled 2021-05-08 (×6): qty 1

## 2021-05-08 MED ORDER — METOPROLOL SUCCINATE ER 25 MG PO TB24
12.5000 mg | ORAL_TABLET | Freq: Every day | ORAL | Status: DC
  Administered 2021-05-08 – 2021-05-10 (×3): 12.5 mg via ORAL
  Filled 2021-05-08 (×3): qty 1

## 2021-05-08 MED ORDER — MIRABEGRON ER 50 MG PO TB24
50.0000 mg | ORAL_TABLET | Freq: Every day | ORAL | Status: DC
  Administered 2021-05-09 – 2021-05-11 (×3): 50 mg via ORAL
  Filled 2021-05-08 (×3): qty 1

## 2021-05-08 NOTE — Progress Notes (Signed)
Primary Care Physician: Jinny Sanders, MD Primary Cardiologist: Dr Fletcher Anon Primary Electrophysiologist: Dr Rayann Heman Referring Physician: Dr Rayann Heman   [name] is a 76 y.o. female with a history of anemia, DM, HTN, hypothyroidism, OSA, MR, atrial fibrillation who presents for follow up in the [facility]. Patient is on Eliquis for a CHADS2VASC score of 4. She was seen at the ED 11/21/20 for afib with RVR and underwent DCCV at that time. Dizziness is typically associated with her afib in the past although she does have a history of vertigo. Patient has been having more issues with dizziness and falls recently.   On follow up today, patient presents for dofetilide admission. She is in SR today. She denies any missed doses of anticoagulation in the last 3 weeks. She has stopped trimethoprim, Lexapro, and trazodone.   Today, she denies symptoms of palpitations, chest pain, shortness of breath, orthopnea, PND, lower extremity edema, presyncope, syncope, snoring, daytime somnolence, bleeding, or neurologic sequela. The patient is tolerating medications without difficulties and is otherwise without complaint today.    Atrial Fibrillation Risk Factors:  she does have symptoms or diagnosis of sleep apnea. she is compliant with CPAP therapy. she does not have a history of rheumatic fever.   she has a BMI of Body mass index is 27.86 kg/m.[name] Filed Weights   05/08/21 0915  Weight: 75.9 kg      Family History  Problem Relation Age of Onset   Cancer Mother        fallopian tube   Dementia Mother    Pulmonary embolism Mother    Hyperlipidemia Mother    Diabetes Mother    Hyperlipidemia Father    Lung cancer Father    Hypertension Sister    Diabetes Brother    Cancer Brother    Stroke Paternal Grandmother    Stroke Paternal Grandfather    Kidney cancer Son    Dementia Maternal Aunt    Colon cancer Neg Hx    Esophageal cancer Neg Hx      Atrial  Fibrillation Management history:  Previous antiarrhythmic drugs: flecainide Previous cardioversions: 2018 x 2, 2020, 2021 x 2, 11/21/20, 03/24/21 Previous ablations: 12/17/16 CHADS2VASC score: 4 Anticoagulation history: Eliquis   Past Medical History:  Diagnosis Date   Anemia    Anxiety    Atrial fibrillation ([place])    Colon polyps    Constipation    Depressive disorder, not elsewhere classified    Diabetes mellitus without complication ([place])    Diaphragmatic hernia without mention of obstruction or gangrene    Disorders of bursae and tendons in shoulder region, unspecified    Diverticulosis of colon (without mention of hemorrhage)    External hemorrhoid    Fundic gland polyps of stomach, benign    GERD (gastroesophageal reflux disease)    History of hiatal hernia    HTN (hypertension)    Hypertension    Hypothyroidism    Internal hemorrhoids without mention of complication    Moderate mitral regurgitation    Obesity, unspecified    OSA on CPAP    compliant with CPAP   Other urinary incontinence    Persistent atrial fibrillation ([place])    Pure hypercholesterolemia    Type II diabetes mellitus ([place])    Urinary frequency    Urinary urgency    Vitamin B 12 deficiency    Past Surgical History:  Procedure Laterality Date   ABDOMINAL HYSTERECTOMY     ABDOMINAL HYSTERECTOMY  1972   Have both ovaries   ATRIAL FIBRILLATION ABLATION  12/17/2016   ATRIAL FIBRILLATION ABLATION N/A 12/17/2016   Procedure: ATRIAL FIBRILLATION ABLATION;  Surgeon: Thompson Grayer, MD;  Location: [facility];  Service: Cardiovascular;  Laterality: N/A;   [place]   [facility]; "Dr. [name]"   CARDIOVERSION N/A 05/27/2016   Procedure: CARDIOVERSION;  Surgeon: [name], MD;  Location: [facility];  Service: Cardiovascular;  Laterality: N/A;   CARDIOVERSION N/A 11/25/2016   Procedure: CARDIOVERSION;  Surgeon: [name], MD;  Location: [facility];  Service: Cardiovascular;   Laterality: N/A;   CARDIOVERSION N/A 09/18/2018   Procedure: CARDIOVERSION;  Surgeon: [name], MD;  Location: [facility];  Service: Cardiovascular;  Laterality: N/A;   CARDIOVERSION N/A 07/28/2019   Procedure: CARDIOVERSION;  Surgeon: Kate Sable, MD;  Location: [facility];  Service: Cardiovascular;  Laterality: N/A;   CARDIOVERSION N/A 09/07/2019   Procedure: CARDIOVERSION;  Surgeon: Nelva Bush, MD;  Location: [facility];  Service: Cardiovascular;  Laterality: N/A;   CATARACT EXTRACTION W/ INTRAOCULAR LENS  IMPLANT, BILATERAL Bilateral    COLONOSCOPY     ELECTROPHYSIOLOGIC STUDY N/A 01/24/2016   Procedure: CARDIOVERSION;  Surgeon: [name], MD;  Location: [facility];  Service: Cardiovascular;  Laterality: N/A;   implantable loop recorder implantation  10/18/2019   Medtronic Reveal [place] model [phone]) implantable loop recorder    LAPAROSCOPIC CHOLECYSTECTOMY     REDUCTION MAMMAPLASTY Bilateral    TONSILLECTOMY     TOTAL HIP ARTHROPLASTY Right 05/12/2018   Procedure: TOTAL HIP ARTHROPLASTY ANTERIOR APPROACH;  Surgeon: Paralee Cancel, MD;  Location: [facility];  Service: Orthopedics;  Laterality: Right;  70 mins    Current Outpatient Medications  Medication Sig Dispense Refill   Ascorbic Acid (VITAMIN C PO) Take 1 tablet by mouth at bedtime.     atorvastatin (LIPITOR) 10 MG tablet TAKE ONE TABLET BY MOUTH AT BEDTIME (Patient taking differently: Take 10 mg by mouth at bedtime.) 90 tablet 1   calcium carbonate (OS-CAL - DOSED IN MG OF ELEMENTAL CALCIUM) 1250 (500 Ca) MG tablet Take 1 tablet by mouth daily with breakfast.     ELIQUIS 5 MG TABS tablet TAKE 1 TABLET BY MOUTH TWICE A DAY (Patient taking differently: Take 5 mg by mouth 2 (two) times daily.) 60 tablet 9   hydroxypropyl methylcellulose / hypromellose (ISOPTO TEARS / GONIOVISC) 2.5 % ophthalmic solution Place 1 drop into both eyes 2 (two) times daily as needed for dry eyes.     linagliptin (TRADJENTA) 5 MG TABS  tablet Take 1 tablet (5 mg total) by mouth daily. 90 tablet 3   metFORMIN (GLUCOPHAGE-XR) 500 MG 24 hr tablet TAKE 2 TABLETS (1,000 MG TOTAL) BY MOUTH 2 (TWO) TIMES DAILY WITH A MEAL. 360 tablet 1   metoprolol succinate (TOPROL XL) 25 MG 24 hr tablet Take 0.5 tablets (12.5 mg total) by mouth at bedtime. 30 tablet 3   MYRBETRIQ 50 MG TB24 tablet Take 50 mg by mouth daily.     ONETOUCH ULTRA test strip CHECK BLOOD SUGAR 2 TIMES A DAY AS DIRECTED DX CODE E11.9 100 strip 7   magnesium oxide (MAG-OX) 400 MG tablet Take 1 tablet (400 mg total) by mouth daily. (Patient not taking: Reported on 05/08/2021) 90 tablet 0   VITAMIN D PO Take 5,000 mg by mouth daily in the afternoon. (Patient not taking: Reported on 05/08/2021)     No current facility-administered medications for this encounter.  Allergies  Allergen Reactions   Cortisone Shortness Of Breath and Rash   Pravachol [Pravastatin Sodium]     Leg pain    Contrast Media [Iodinated Contrast Media] Rash    Social History   Socioeconomic History   Marital status: Married    Spouse name: Jimmy   Number of children: 2   Years of education: Not on file   Highest education level: Not on file  Occupational History   Not on file  Tobacco Use   Smoking status: Never   Smokeless tobacco: Never  Vaping Use   Vaping Use: Never used  Substance and Sexual Activity   Alcohol use: No    Alcohol/week: 0.0 standard drinks   Drug use: No   Sexual activity: Yes  Other Topics Concern   Not on file  Social History Narrative   ** Merged History Encounter **       Exercise walks 3 times daily Lives with spouse in [place] - retired from employment 1 son and 1 daughter  Diet fruit, occ fast food, salads and lean meat  3 tea/day, no EtOH, no tobacco, no drugs    Social Determinants of Radio broadcast assistant Strain: Low Risk    Difficulty of Paying Living Expenses: Not very hard  Food Insecurity: No Food Insecurity   Worried  About Charity fundraiser in the Last Year: Never true   Ran Out of Food in the Last Year: Never true  Transportation Needs: No Transportation Needs   Lack of Transportation (Medical): No   Lack of Transportation (Non-Medical): No  Physical Activity: Insufficiently Active   Days of Exercise per Week: 3 days   Minutes of Exercise per Session: 40 min  Stress: No Stress Concern Present   Feeling of Stress : Not at all  Social Connections: Not on file  Intimate Partner Violence: Not At Risk   Fear of Current or Ex-Partner: No   Emotionally Abused: No   Physically Abused: No   Sexually Abused: No     ROS- All systems are reviewed and negative except as per the HPI above.  Physical Exam: Vitals:   05/08/21 0915  BP: (!) 156/68  Pulse: 74  Weight: 75.9 kg  Height: '5\' 5"'$  (1.651 m)    GEN- The patient is a well appearing elderly female, alert and oriented x 3 today.   HEENT-head normocephalic, atraumatic, sclera clear, conjunctiva pink, hearing intact, trachea midline. Lungs- Clear to ausculation bilaterally, normal work of breathing Heart- Regular rate and rhythm, no murmurs, rubs or gallops  GI- soft, NT, ND, + BS Extremities- no clubbing, cyanosis, or edema MS- no significant deformity or atrophy Skin- no rash or lesion Psych- euthymic mood, full affect Neuro- strength and sensation are intact   Wt Readings from Last 3 Encounters:  05/08/21 75.9 kg  04/16/21 78 kg  04/12/21 75.8 kg    EKG today demonstrates  SR Vent. rate 74 BPM PR interval 180 ms QRS duration 92 ms QT/QTcB 378/419 ms  Echo 04/24/20 demonstrated   1. Left ventricular ejection fraction, by estimation, is 55 to 60%. The  left ventricle has normal function. The left ventricle has no regional  wall motion abnormalities. The left ventricular internal cavity size was mildly dilated. Left ventricular diastolic parameters are indeterminate.   2. Right ventricular systolic function is normal. The right  ventricular  size is normal. There is moderately elevated pulmonary artery systolic pressure. The estimated right ventricular systolic pressure is 49.0  mmHg.   3. Left atrial size was severely dilated.   4. The mitral valve is normal in structure. Moderate mitral valve  regurgitation. No evidence of mitral stenosis.   5. The aortic valve is normal in structure. Aortic valve regurgitation is not visualized. Mild aortic valve sclerosis is present, with no evidence of aortic valve stenosis.   6. The inferior vena cava is normal in size with greater than 50%  respiratory variability, suggesting right atrial pressure of 3 mmHg.   Comparison(s): Prior Echo showed LV EF 55-60%, severe LAE with eccentric MR.   Epic records are reviewed at length today  CHA2DS2-VASc Score = 4  The patient's score is based upon: CHF History: 0 HTN History: 1 Diabetes History: 1 Stroke History: 0 Vascular Disease History: 0 Age Score: 1 Gender Score: 1       ASSESSMENT AND PLAN: 1. Persistent Atrial Fibrillation (ICD10:  I48.19) The patient's CHA2DS2-VASc score is 4, indicating a 4.8% annual risk of stroke.   Patient presents for dofetilide admission.  Continue Eliquis 5 mg BID, states no missed doses in the last 3 weeks. No recent benadryl use PharmD has screened medications. Trazodone, escitalopram, and trimethoprim have been discontinued.  QTc in SR 419 ms Labs today show creatinine at 1.00, K+ 4.5 and mag 1.7, CrCl calculated at 59 mL/min Continue Toprol 12.5 mg daily  2. Secondary Hypercoagulable State (ICD10:  D68.69) The patient is at significant risk for stroke/thromboembolism based upon her CHA2DS2-VASc Score of 4.  Continue Apixaban (Eliquis).   3. OSA Encouraged compliance with CPAP therapy.  4. HTN Stable, no changes today.  5. MR Moderate MR   To be admitted later today once a bed becomes available.    [facility] [address] [place], [place] [address] [phone]

## 2021-05-08 NOTE — Care Management (Signed)
05-08-21 Patient presented for Tikosyn Load. Benefits check submitted for cost. Case Manager will follow for cost and pharmacy of choice.  ?

## 2021-05-08 NOTE — H&P (Addendum)
Electrophysiology H&P  Note    Primary Care Physician: Jinny Sanders, MD Primary Cardiologist: Dr Fletcher Anon Primary Electrophysiologist: Dr Rayann Heman Referring Physician: Dr Rayann Heman   [name] is a 76 y.o. female with a history of anemia, DM, HTN, hypothyroidism, OSA, MR, atrial fibrillation who presents for follow up in the [facility]. Patient is on Eliquis for a CHADS2VASC score of 4. She was seen at the ED 11/21/20 for afib with RVR and underwent DCCV at that time. Dizziness is typically associated with her afib in the past although she does have a history of vertigo. Patient has been having more issues with dizziness and falls recently.   On follow up today, patient presents for dofetilide admission. She is in SR today. She denies any missed doses of anticoagulation in the last 3 weeks. She has stopped trimethoprim, Lexapro, and trazodone.   Today, she denies symptoms of palpitations, chest pain, shortness of breath, orthopnea, PND, lower extremity edema, presyncope, syncope, snoring, daytime somnolence, bleeding, or neurologic sequela. The patient is tolerating medications without difficulties and is otherwise without complaint today.    Atrial Fibrillation Risk Factors:  she does have symptoms or diagnosis of sleep apnea. she is compliant with CPAP therapy. she does not have a history of rheumatic fever.   she has a BMI of Body mass index is 27.86 kg/m.[name]    Filed Weights    05/08/21 0915  Weight: 75.9 kg        Family History  Problem Relation Age of Onset   Cancer Mother        fallopian tube   Dementia Mother    Pulmonary embolism Mother    Hyperlipidemia Mother    Diabetes Mother    Hyperlipidemia Father    Lung cancer Father    Hypertension Sister    Diabetes Brother    Cancer Brother    Stroke Paternal Grandmother    Stroke Paternal Grandfather    Kidney cancer Son    Dementia Maternal Aunt    Colon cancer Neg Hx    Esophageal  cancer Neg Hx      Atrial Fibrillation Management history:  Previous antiarrhythmic drugs: flecainide Previous cardioversions: 2018 x 2, 2020, 2021 x 2, 11/21/20, 03/24/21 Previous ablations: 12/17/16 CHADS2VASC score: 4 Anticoagulation history: Eliquis   Past Medical History:  Diagnosis Date   Anemia    Anxiety    Atrial fibrillation ([place])    Colon polyps    Constipation    Depressive disorder, not elsewhere classified    Diabetes mellitus without complication ([place])    Diaphragmatic hernia without mention of obstruction or gangrene    Disorders of bursae and tendons in shoulder region, unspecified    Diverticulosis of colon (without mention of hemorrhage)    External hemorrhoid    Fundic gland polyps of stomach, benign    GERD (gastroesophageal reflux disease)    History of hiatal hernia    HTN (hypertension)    Hypertension    Hypothyroidism    Internal hemorrhoids without mention of complication    Moderate mitral regurgitation    Obesity, unspecified    OSA on CPAP    compliant with CPAP   Other urinary incontinence    Persistent atrial fibrillation ([place])    Pure hypercholesterolemia    Type II diabetes mellitus ([place])    Urinary frequency    Urinary urgency    Vitamin B 12 deficiency    Past Surgical History:  Procedure Laterality  Date   ABDOMINAL HYSTERECTOMY     ABDOMINAL HYSTERECTOMY  1972   Have both ovaries   ATRIAL FIBRILLATION ABLATION  12/17/2016   ATRIAL FIBRILLATION ABLATION N/A 12/17/2016   Procedure: ATRIAL FIBRILLATION ABLATION;  Surgeon: Thompson Grayer, MD;  Location: [facility];  Service: Cardiovascular;  Laterality: N/A;   [place]   [facility]; "Dr. [name]"   CARDIOVERSION N/A 05/27/2016   Procedure: CARDIOVERSION;  Surgeon: [name], MD;  Location: [facility];  Service: Cardiovascular;  Laterality: N/A;   CARDIOVERSION N/A 11/25/2016   Procedure: CARDIOVERSION;  Surgeon: [name], MD;  Location: [facility];   Service: Cardiovascular;  Laterality: N/A;   CARDIOVERSION N/A 09/18/2018   Procedure: CARDIOVERSION;  Surgeon: [name], MD;  Location: [facility];  Service: Cardiovascular;  Laterality: N/A;   CARDIOVERSION N/A 07/28/2019   Procedure: CARDIOVERSION;  Surgeon: Kate Sable, MD;  Location: [facility];  Service: Cardiovascular;  Laterality: N/A;   CARDIOVERSION N/A 09/07/2019   Procedure: CARDIOVERSION;  Surgeon: Nelva Bush, MD;  Location: [facility];  Service: Cardiovascular;  Laterality: N/A;   CATARACT EXTRACTION W/ INTRAOCULAR LENS  IMPLANT, BILATERAL Bilateral    COLONOSCOPY     ELECTROPHYSIOLOGIC STUDY N/A 01/24/2016   Procedure: CARDIOVERSION;  Surgeon: [name], MD;  Location: [facility];  Service: Cardiovascular;  Laterality: N/A;   implantable loop recorder implantation  10/18/2019   Medtronic Reveal [place] model [id number]) implantable loop recorder    LAPAROSCOPIC CHOLECYSTECTOMY     REDUCTION MAMMAPLASTY Bilateral    TONSILLECTOMY     TOTAL HIP ARTHROPLASTY Right 05/12/2018   Procedure: TOTAL HIP ARTHROPLASTY ANTERIOR APPROACH;  Surgeon: Paralee Cancel, MD;  [facility]: [facility];  Service: Orthopedics;  Laterality: Right;  70 mins    Current Facility-Administered Medications  Medication Dose Route Frequency Provider Last Rate Last Admin   0.9 %  sodium chloride infusion  250 mL Intravenous PRN Fenton, Clint R, PA       apixaban (ELIQUIS) tablet 5 mg  5 mg Oral BID Fenton, Clint R, PA       atorvastatin (LIPITOR) tablet 10 mg  10 mg Oral QHS Fenton, Clint R, PA       dofetilide (TIKOSYN) capsule 250 mcg  250 mcg Oral BID Fenton, Clint R, PA       [START ON 05/09/2021] linagliptin (TRADJENTA) tablet 5 mg  5 mg Oral Daily Fenton, Clint R, PA       magnesium oxide (MAG-OX) tablet 400 mg  400 mg Oral Daily Fenton, Clint R, PA   400 mg at 05/08/21 1201   metFORMIN (GLUCOPHAGE-XR) 24 hr tablet 1,000 mg  1,000 mg Oral BID WC Fenton, Clint R, PA       metoprolol  succinate (TOPROL-XL) 24 hr tablet 12.5 mg  12.5 mg Oral QHS Shirley Friar, PA-C   12.5 mg at 05/08/21 1201   [START ON 05/09/2021] mirabegron ER (MYRBETRIQ) tablet 50 mg  50 mg Oral Daily Fenton, Clint R, PA       sodium chloride flush (NS) 0.9 % injection 3 mL  3 mL Intravenous Q12H Fenton, Clint R, PA   3 mL at 05/08/21 1210   sodium chloride flush (NS) 0.9 % injection 3 mL  3 mL Intravenous PRN Fenton, Clint R, PA        Allergies  Allergen Reactions   Cortisone Shortness Of Breath and Rash   Pravachol [Pravastatin Sodium] Other (See Comments)    Leg pain  Contrast Media [Iodinated Contrast Media] Rash    Social History   Socioeconomic History   Marital status: Married    Spouse name: Jimmy   Number of children: 2   Years of education: Not on file   Highest education level: Not on file  Occupational History   Not on file  Tobacco Use   Smoking status: Never   Smokeless tobacco: Never  Vaping Use   Vaping Use: Never used  Substance and Sexual Activity   Alcohol use: No    Alcohol/week: 0.0 standard drinks   Drug use: No   Sexual activity: Yes  Other Topics Concern   Not on file  Social History Narrative   ** Merged History Encounter **       Exercise walks 3 times daily Lives with spouse in [place] - retired from employment 1 son and 1 daughter  Diet fruit, occ fast food, salads and lean meat  3 tea/day, no EtOH, no tobacco, no drugs    Social Determinants of Radio broadcast assistant Strain: Low Risk    Difficulty of Paying Living Expenses: Not very hard  Food Insecurity: No Food Insecurity   Worried About Charity fundraiser in the Last Year: Never true   [place] in the Last Year: Never true  Transportation Needs: No Transportation Needs   Lack of Transportation (Medical): No   Lack of Transportation (Non-Medical): No  Physical Activity: Insufficiently Active   Days of Exercise per Week: 3 days   Minutes of Exercise  per Session: 40 min  Stress: No Stress Concern Present   Feeling of Stress : Not at all  Social Connections: Not on file  Intimate Partner Violence: Not At Risk   Fear of Current or Ex-Partner: No   Emotionally Abused: No   Physically Abused: No   Sexually Abused: No     ROS- All systems are reviewed and negative except as per the HPI above.  Physical Exam: Vitals:   05/08/21 1025 05/08/21 1201  BP: (!) 174/87 (!) 156/100  Pulse: 73 77  Temp: 98.1 F (36.7 C)   TempSrc: Oral   SpO2: 100%     GEN- The patient is a well appearing elderly female, alert and oriented x 3 today.   HEENT-head normocephalic, atraumatic, sclera clear, conjunctiva pink, hearing intact, trachea midline. Lungs- Clear to ausculation bilaterally, normal work of breathing Heart- Regular rate and rhythm, no murmurs, rubs or gallops  GI- soft, NT, ND, + BS Extremities- no clubbing, cyanosis, or edema MS- no significant deformity or atrophy Skin- no rash or lesion Psych- euthymic mood, full affect Neuro- strength and sensation are intact   Wt Readings from Last 3 Encounters:  05/08/21 75.9 kg  04/16/21 78 kg  04/12/21 75.8 kg    EKG today demonstrates  SR Vent. rate 74 BPM PR interval 180 ms QRS duration 92 ms QT/QTcB 378/419 ms  Echo 04/24/20 demonstrated   1. Left ventricular ejection fraction, by estimation, is 55 to 60%. The  left ventricle has normal function. The left ventricle has no regional  wall motion abnormalities. The left ventricular internal cavity size was mildly dilated. Left ventricular diastolic parameters are indeterminate.   2. Right ventricular systolic function is normal. The right ventricular  size is normal. There is moderately elevated pulmonary artery systolic pressure. The estimated right ventricular systolic pressure is 71.6 mmHg.   3. Left atrial size was severely dilated.   4. The mitral valve is  normal in structure. Moderate mitral valve  regurgitation. No  evidence of mitral stenosis.   5. The aortic valve is normal in structure. Aortic valve regurgitation is not visualized. Mild aortic valve sclerosis is present, with no evidence of aortic valve stenosis.   6. The inferior vena cava is normal in size with greater than 50%  respiratory variability, suggesting right atrial pressure of 3 mmHg.   Comparison(s): Prior Echo showed LV EF 55-60%, severe LAE with eccentric MR.   Epic records are reviewed at length today  CHA2DS2-VASc Score = 4  The patient's score is based upon: CHF History: 0 HTN History: 1 Diabetes History: 1 Stroke History: 0 Vascular Disease History: 0 Age Score: 1 Gender Score: 1       ASSESSMENT AND PLAN: 1. Persistent Atrial Fibrillation (ICD10:  I48.19) The patient's CHA2DS2-VASc score is 4, indicating a 4.8% annual risk of stroke.   Patient presents for dofetilide admission.  Continue Eliquis 5 mg BID, states no missed doses in the last 3 weeks. No recent benadryl use PharmD has screened medications. Trazodone, escitalopram, and trimethoprim have been discontinued.  QTc in SR 419 ms Labs today show creatinine at 1.00, K+ 4.5 and mag 1.7, CrCl calculated at 59 mL/min Continue Toprol 12.5 mg daily  2. Secondary Hypercoagulable State (ICD10:  D68.69) The patient is at significant risk for stroke/thromboembolism based upon her CHA2DS2-VASc Score of 4.  Continue Apixaban (Eliquis).   3. OSA Encouraged compliance with CPAP therapy.  4. HTN Stable, no changes today.  5. MR Moderate MR   She presents today for planned tikosyn admission.   Legrand Como [address]" [place], PA-C  05/08/2021 2:27 PM   EP Attending  Patient seen and examined. Agree with above. The patient presents today for initiation of dofetilide. She is a pleasant  with recurrent atrial fib who is admitted for initiation of dofetilide. Her exam is notable for her being well appearing and in NAD. Lungs are clear and CV reveals a RRR. No edema. Tele  demonstrates NSR. QT is acceptable.   Cristopher Peru, MD

## 2021-05-08 NOTE — Progress Notes (Signed)
Pharmacy: Dofetilide (Tikosyn) - Follow Up ?Assessment and Electrolyte Replacement ? ?Pharmacy consulted to assist in monitoring and replacing electrolytes in this 76 y.o. female admitted on 05/08/2021 undergoing dofetilide initiation. First dofetilide dose: '250mg'$  BID PM 3/7 ? ?Labs: ?   ?Component Value Date/Time  ? K 4.5 05/08/2021 0934  ? MG 2.6 (H) 05/08/2021 1924  ?  ? ?Plan: ?Potassium: ?K >/= 4: No additional supplementation needed ? ?Magnesium: ?Mg > 2: No additional supplementation needed ? ? ?[name], PharmD, BCIDP, AAHIVP, CPP ?Infectious Disease Pharmacist ?05/08/2021 9:33 PM ? ? ? ?

## 2021-05-08 NOTE — Telephone Encounter (Signed)
?  Care Management  ? ?Follow Up Note ? ? ?05/08/2021 ?Name: [name] MRN: [id number] DOB: 1945-06-29 ? ? ?Referred by: Jinny Sanders, MD ?Reason for referral : Chronic Care Management ? ? ?Successful contact was made with patient. HIPAA verified. Patient states she is at the hospital for admission.  ? ?Follow Up Plan: The care management team will reach out to the patient again over the next 2 weeks.  ? ?Quinn Plowman RN,BSN,CCM ?RN Case Manager ?[place]  ?[phone] ? ? ?

## 2021-05-08 NOTE — Progress Notes (Signed)
Pharmacy: Dofetilide (Tikosyn) - Initial Consult ?Assessment and Electrolyte Replacement ? ?Pharmacy consulted to assist in monitoring and replacing electrolytes in this 76 y.o. female admitted on 05/08/2021 undergoing dofetilide initiation. First dofetilide dose: Dofetilide 250 mcg BID ordered to start this evening. ? ?Assessment: ? ?Patient Exclusion Criteria: If any screening criteria checked as "Yes", then  patient  should NOT receive dofetilide until criteria item is corrected.  ?If ?Yes? please indicate correction plan. ? ?YES  NO Patient  Exclusion Criteria Correction Plan  ? ?'[]'$   ?'[x]'$   ?Baseline QTc interval is greater than or equal to 440 msec. ?IF above YES box checked dofetilide contraindicated unless patient has ICD; then may proceed if QTc 500-550 msec or with known ventricular conduction abnormalities may proceed with QTc 550-600 msec. ?QTc = 419    ? ?'[]'$   ?'[x]'$   ?Patient is known or suspected to have a digoxin level greater than 2 ng/ml: ?Lab Results  ?Component Value Date  ? DIGOXIN 0.7 (L) 01/25/2016  ? ?   ? ?'[]'$   ?'[x]'$   ?Creatinine clearance less than 20 ml/min (calculated using Cockcroft-Gault, actual body weight and serum creatinine): ?Estimated Creatinine Clearance: 50.3 mL/min (by C-G formula based on SCr of 1 mg/dL). ?   ? ?'[]'$   ?'[x]'$  Patient has received drugs known to prolong the QT intervals within the last 48 hours (phenothiazines, tricyclics or tetracyclic antidepressants, erythromycin, H-1 antihistamines, cisapride, fluoroquinolones, azithromycin). Drugs not listed above may have an, as yet, undetected potential to prolong the QT interval, updated information on QT prolonging agents is available at this website:QT prolonging agents or www.crediblemeds.org   ? ?'[]'$   ?'[x]'$   ?Patient received a dose of hydrochlorothiazide (Oretic) alone or in any combination including triamterene (Dyazide, Maxzide) in the last 48 hours.   ? ?'[]'$   ?'[x]'$  Patient received a medication known to increase dofetilide plasma  concentrations prior to initial dofetilide dose:  ?Trimethoprim (Primsol, Proloprim) in the last 36 hours ?Verapamil (Calan, Verelan) in the last 36 hours or a sustained release dose in the last 72 hours ?Megestrol (Megace) in the last 5 days  ?Cimetidine (Tagamet) in the last 6 hours ?Ketoconazole (Nizoral) in the last 24 hours ?Itraconazole (Sporanox) in the last 48 hours  ?Prochlorperazine (Compazine) in the last 36 hours ?   ? ?'[]'$   ?'[x]'$   ?Patient is known to have a history of torsades de pointes; congenital or acquired long QT syndromes.   ? ?'[]'$   ?'[x]'$   ?Patient has received a Class 1 antiarrhythmic with less than 2 half-lives since last dose. ?(Disopyramide, Quinidine, Procainamide, Lidocaine, Mexiletine, Flecainide, Propafenone)   ? ?'[]'$   ?'[x]'$   ?Patient has received amiodarone therapy in the past 3 months or amiodarone level is greater than 0.3 ng/ml.   ? ?Patient has been appropriately anticoagulated with Eliquis. ? ?Labs: ?   ?Component Value Date/Time  ? K 4.5 05/08/2021 0934  ? MG 1.7 05/08/2021 0934  ?  ? ?Plan: ?Potassium: ?K >/= 4: Appropriate to initiate Tikosyn, no replacement needed   ? ?Magnesium: ?Mg 1.3-1.7: Hold Tikosyn initiation and give Mg 4 gm x1 IV, recheck Mg 4hr after end of infusion - repeat appropriate dose if not > 1.8   ? ? ?Thank you for allowing pharmacy to participate in this patient's care  ? ?[place] Crane, Pharm D, BCPS, BCCP ?Clinical Pharmacist ? 05/08/2021 12:39 PM  ? ?[facility] pharmacy phone numbers are listed on [email or web address] ? ?

## 2021-05-08 NOTE — TOC Benefit Eligibility Note (Signed)
Patient Advocate Encounter ?  ?Insurance verification completed.   ?  ?The patient is currently admitted and upon discharge could be taking TIKOSYN. ?  ?The current 30 day co-pay is, $100.  ? ?The patient is insured through [employer]. ? ? ?  ? ?

## 2021-05-09 ENCOUNTER — Encounter (HOSPITAL_COMMUNITY): Payer: Self-pay | Admitting: Internal Medicine

## 2021-05-09 LAB — BASIC METABOLIC PANEL
Anion gap: 8 (ref 5–15)
BUN: 12 mg/dL (ref 8–23)
CO2: 28 mmol/L (ref 22–32)
Calcium: 9.4 mg/dL (ref 8.9–10.3)
Chloride: 105 mmol/L (ref 98–111)
Creatinine, Ser: 0.99 mg/dL (ref 0.44–1.00)
GFR, Estimated: 60 mL/min — ABNORMAL LOW (ref >60)
Glucose, Bld: 121 mg/dL — ABNORMAL HIGH (ref 70–99)
Potassium: 4.9 mmol/L (ref 3.5–5.1)
Sodium: 141 mmol/L (ref 135–145)

## 2021-05-09 LAB — GLUCOSE, CAPILLARY
Glucose-Capillary: 144 mg/dL — ABNORMAL HIGH (ref 70–99)
Glucose-Capillary: 123 mg/dL — ABNORMAL HIGH (ref 70–99)
Glucose-Capillary: 94 mg/dL (ref 70–99)
Glucose-Capillary: 164 mg/dL — ABNORMAL HIGH (ref 70–99)

## 2021-05-09 LAB — MAGNESIUM: Magnesium: 2.3 mg/dL (ref 1.7–2.4)

## 2021-05-09 MED ORDER — DOFETILIDE 250 MCG PO CAPS
250.0000 ug | ORAL_CAPSULE | Freq: Two times a day (BID) | ORAL | Status: DC
  Administered 2021-05-09 – 2021-05-11 (×4): 250 ug via ORAL
  Filled 2021-05-09 (×4): qty 1

## 2021-05-09 NOTE — Progress Notes (Signed)
Morning EKG reviewed   ? ?Shows remains in NSR at with stable QTc at ~450-460 ms. ? ?Continue  Tikosyn 250 mcg BID.  ? ?Pt will not require DCCV. Plan for home Friday if QTc remains stable.   ? ?Shirley Friar, PA-C  ?Pager: [phone]  ?05/09/2021 12:39 PM  ? ?

## 2021-05-09 NOTE — Care Management (Signed)
05-09-21 1310 Case Manager spoke with the patient and she is agreeable to the cost. We discussed Good Rx and she states the total is fine. Patient would like the initial Rx to be filled via [facility] and Rx refills sent to [facility] in [place]. No further needs identified at this time.  ?

## 2021-05-09 NOTE — Progress Notes (Signed)
? ?Electrophysiology Rounding Note ? ?Patient Name: [name] ?Date of Encounter: 05/09/2021 ? ?Primary Cardiologist: Kathlyn Sacramento, MD  ?Electrophysiologist: Dr. Caryl Comes ? ? ?Subjective  ? ?Pt  remains in NSR  on Tikosyn 250 mcg BID  ? ?QTc from EKG last pm shows stable QTc at ~470 ? ?The patient is doing well today.  At this time, the patient denies chest pain, shortness of breath, or any new concerns. ? ?Inpatient Medications  ?  ?Scheduled Meds: ? apixaban  5 mg Oral BID  ? atorvastatin  10 mg Oral QHS  ? dofetilide  250 mcg Oral BID  ? linagliptin  5 mg Oral Daily  ? magnesium oxide  400 mg Oral Daily  ? metFORMIN  1,000 mg Oral BID WC  ? metoprolol succinate  12.5 mg Oral QHS  ? mirabegron ER  50 mg Oral Daily  ? sodium chloride flush  3 mL Intravenous Q12H  ? ?Continuous Infusions: ? sodium chloride    ? ?PRN Meds: ?sodium chloride, sodium chloride flush  ? ?Vital Signs  ?  ?Vitals:  ? 05/08/21 1025 05/08/21 1201 05/08/21 2010 05/09/21 0530  ?BP: (!) 174/87 (!) 156/100 115/64 136/80  ?Pulse: 73 77 71 68  ?Resp:   19 17  ?Temp: 98.1 ?F (36.7 ?C)  98.1 ?F (36.7 ?C) 97.9 ?F (36.6 ?C)  ?TempSrc: Oral  Oral Oral  ?SpO2: 100%  100% 100%  ? ? ?Intake/Output Summary (Last 24 hours) at 05/09/2021 0724 ?Last data filed at 05/08/2021 1600 ?Gross per 24 hour  ?Intake 100 ml  ?Output --  ?Net 100 ml  ? ?There were no vitals filed for this visit. ? ?Physical Exam  ?  ?GEN- The patient is well appearing, alert and oriented x 3 today.   ?Head- normocephalic, atraumatic ?Eyes-  Sclera clear, conjunctiva pink ?Ears- hearing intact ?Oropharynx- clear ?Neck- supple ?Lungs- Clear to ausculation bilaterally, normal work of breathing ?Heart- Regular rate and rhythm, no murmurs, rubs or gallops ?GI- soft, NT, ND, + BS ?Extremities- no clubbing, cyanosis, or edema ?Skin- no rash or lesion ?Psych- euthymic mood, full affect ?Neuro- strength and sensation are intact ? ?Labs  ?  ?CBC ?No results for input(s): WBC, NEUTROABS, HGB, HCT,  MCV, PLT in the last 72 hours. ?Basic Metabolic Panel ?Recent Labs  ?  05/08/21 ?0934 05/08/21 ?1924 05/09/21 ?6967  ?NA 136  --  141  ?K 4.5  --  4.9  ?CL 103  --  105  ?CO2 25  --  28  ?GLUCOSE 194*  --  121*  ?BUN 15  --  12  ?CREATININE 1.00  --  0.99  ?CALCIUM 9.4  --  9.4  ?MG 1.7 2.6* 2.3  ? ? ?Potassium  ?Date/Time Value Ref Range Status  ?05/09/2021 05:29 AM 4.9 3.5 - 5.1 mmol/L Final  ? ?Magnesium  ?Date/Time Value Ref Range Status  ?05/09/2021 05:29 AM 2.3 1.7 - 2.4 mg/dL Final  ?  Comment:  ?  Performed at [facility] [address], [place], [place] [address]  ? ? ?Telemetry  ?  ?NSR 60-70s (personally reviewed) ? ?Radiology  ?  ?No results found. ? ? ?Patient Profile  ?   ?[name] is a 76 y.o. female with a past medical history significant for persistent atrial fibrillation.  They were admitted for tikosyn load.  ? ?Assessment & Plan  ?  ?Persistent atrial fibrillation ?Pt  remains in NSR  on Tikosyn 250 mcg BID  ?Continue  Eliquis ?Electrolytes stable.  ?CHA2DS2VASC is at least 4 ?Previously failed flecainide ? ?2. OSA ?Encouraged nightly CPAP ? ?3. HTN ?Stable on current regimen  ? ?Patient will not require cardioversion. Plan for home Friday if QTc remains stable.  ? ? ?For questions or updates, please contact [place] ?Please consult [email or web address] for contact info under Cardiology/STEMI. ? ?Signed, ?Shirley Friar, PA-C  ?05/09/2021, 7:24 AM  ? ?

## 2021-05-09 NOTE — Progress Notes (Signed)
Pharmacy: Dofetilide (Tikosyn) - Follow Up ?Assessment and Electrolyte Replacement ? ?Pharmacy consulted to assist in monitoring and replacing electrolytes in this 76 y.o. female admitted on 05/08/2021 undergoing dofetilide initiation.  ?Labs: ?   ?Component Value Date/Time  ? K 4.9 05/09/2021 0529  ? MG 2.3 05/09/2021 0529  ?  ? ?Plan: ?Potassium: ?K >/= 4: No additional supplementation needed ? ?Magnesium: ?Mg > 2: No additional supplementation needed ? ? ?Thank you for allowing pharmacy to participate in this patient's care  ? ?Hildred Laser, PharmD ?Clinical Pharmacist ?**Pharmacist phone directory can now be found on [email or web address] ([email or web address]).  Listed under [place]. ? ? ?

## 2021-05-10 LAB — BASIC METABOLIC PANEL
Anion gap: 8 (ref 5–15)
BUN: 14 mg/dL (ref 8–23)
CO2: 24 mmol/L (ref 22–32)
Calcium: 9.2 mg/dL (ref 8.9–10.3)
Chloride: 103 mmol/L (ref 98–111)
Creatinine, Ser: 0.9 mg/dL (ref 0.44–1.00)
GFR, Estimated: >60 mL/min (ref >60)
Glucose, Bld: 102 mg/dL — ABNORMAL HIGH (ref 70–99)
Potassium: 4.7 mmol/L (ref 3.5–5.1)
Sodium: 135 mmol/L (ref 135–145)

## 2021-05-10 LAB — GLUCOSE, CAPILLARY
Glucose-Capillary: 160 mg/dL — ABNORMAL HIGH (ref 70–99)
Glucose-Capillary: 92 mg/dL (ref 70–99)
Glucose-Capillary: 174 mg/dL — ABNORMAL HIGH (ref 70–99)
Glucose-Capillary: 98 mg/dL (ref 70–99)

## 2021-05-10 LAB — MAGNESIUM: Magnesium: 1.8 mg/dL (ref 1.7–2.4)

## 2021-05-10 MED ORDER — MAGNESIUM SULFATE 2 GM/50ML IV SOLN
2.0000 g | Freq: Once | INTRAVENOUS | Status: AC
  Administered 2021-05-10: 09:00:00 2 g via INTRAVENOUS
  Filled 2021-05-10: qty 50

## 2021-05-10 NOTE — Progress Notes (Signed)
Pharmacy: Dofetilide (Tikosyn) - Follow Up ?Assessment and Electrolyte Replacement ? ?Pharmacy consulted to assist in monitoring and replacing electrolytes in this 76 y.o. female admitted on 05/08/2021 undergoing dofetilide initiation.  ? ?Labs: ?   ?Component Value Date/Time  ? K 4.7 05/10/2021 0246  ? MG 1.8 05/10/2021 0246  ?  ? ?Plan: ?Potassium: ?K >/= 4: No additional supplementation needed ? ?Magnesium: ?Mg 1.8-2: Give Mg 2 gm IV x1  ? ? ?She has required 6gm of IV magnesium this admission. Consider increasing Mag-Ox to '800mg'$  daily at discharge ? ?Thank you for allowing pharmacy to participate in this patient's care  ? ?Hildred Laser, PharmD ?Clinical Pharmacist ?**Pharmacist phone directory can now be found on [email or web address] ([email or web address]).  Listed under [place]. ? ? ?

## 2021-05-10 NOTE — Progress Notes (Signed)
Morning EKG reviewed   ? ?Shows remains in NSR at with stable QTc at ~440-450 ms. ? ?Continue  Tikosyn 250 mcg BID.  ? ?Plan for home tomorrow if QTc remains stable.  ? ?Shirley Friar, PA-C  ?Pager: [phone]  ?05/10/2021 11:42 AM  ? ?

## 2021-05-10 NOTE — Progress Notes (Addendum)
? ?Electrophysiology Rounding Note ? ?Patient Name: [name] ?Date of Encounter: 05/10/2021 ? ?Primary Cardiologist: Kathlyn Sacramento, MD  ?Electrophysiologist: Dr. Caryl Comes ? ? ?Subjective  ? ?Pt  remains in NSR  on Tikosyn 250 mcg BID  ? ?QTc from EKG last pm shows stable QTc at ~450-460 ? ?The patient is doing well today.  At this time, the patient denies chest pain, shortness of breath, or any new concerns. ? ?Inpatient Medications  ?  ?Scheduled Meds: ? apixaban  5 mg Oral BID  ? atorvastatin  10 mg Oral QHS  ? dofetilide  250 mcg Oral BID  ? linagliptin  5 mg Oral Daily  ? magnesium oxide  400 mg Oral Daily  ? metFORMIN  1,000 mg Oral BID WC  ? metoprolol succinate  12.5 mg Oral QHS  ? mirabegron ER  50 mg Oral Daily  ? sodium chloride flush  3 mL Intravenous Q12H  ? ?Continuous Infusions: ? sodium chloride    ? ?PRN Meds: ?sodium chloride, sodium chloride flush  ? ?Vital Signs  ?  ?Vitals:  ? 05/08/21 2010 05/09/21 0530 05/09/21 2000 05/10/21 0555  ?BP: 115/64 136/80 135/77 117/60  ?Pulse: 71 68 71 71  ?Resp: '19 17 18 18  '$ ?Temp: 98.1 ?F (36.7 ?C) 97.9 ?F (36.6 ?C) 98.3 ?F (36.8 ?C) 98 ?F (36.7 ?C)  ?TempSrc: Oral Oral Oral Oral  ?SpO2: 100% 100% 100%   ? ? ?Intake/Output Summary (Last 24 hours) at 05/10/2021 0705 ?Last data filed at 05/09/2021 1610 ?Gross per 24 hour  ?Intake 180 ml  ?Output --  ?Net 180 ml  ? ?There were no vitals filed for this visit. ? ?Physical Exam  ?  ?GEN- The patient is well appearing, alert and oriented x 3 today.   ?Head- normocephalic, atraumatic ?Eyes-  Sclera clear, conjunctiva pink ?Ears- hearing intact ?Oropharynx- clear ?Neck- supple ?Lungs- Clear to ausculation bilaterally, normal work of breathing ?Heart-  Somewhat irregular due to ectopy  rate and rhythm, no murmurs, rubs or gallops ?GI- soft, NT, ND, + BS ?Extremities- no clubbing, cyanosis, or edema ?Skin- no rash or lesion ?Psych- euthymic mood, full affect ?Neuro- strength and sensation are intact ? ?Labs  ?  ?CBC ?No  results for input(s): WBC, NEUTROABS, HGB, HCT, MCV, PLT in the last 72 hours. ?Basic Metabolic Panel ?Recent Labs  ?  05/09/21 ?9604 05/10/21 ?0246  ?NA 141 135  ?K 4.9 4.7  ?CL 105 103  ?CO2 28 24  ?GLUCOSE 121* 102*  ?BUN 12 14  ?CREATININE 0.99 0.90  ?CALCIUM 9.4 9.2  ?MG 2.3 1.8  ? ? ?Potassium  ?Date/Time Value Ref Range Status  ?05/10/2021 02:46 AM 4.7 3.5 - 5.1 mmol/L Final  ? ?Magnesium  ?Date/Time Value Ref Range Status  ?05/10/2021 02:46 AM 1.8 1.7 - 2.4 mg/dL Final  ?  Comment:  ?  Performed at [facility] [address] [place], [place] [address]  ? ? ?Telemetry  ?  ?NSR with frequent PACs vs sinus arrhythmia (personally reviewed) ? ?Radiology  ?  ?No results found. ? ? ?Patient Profile  ?   ?[name] is a 76 y.o. female with a past medical history significant for persistent atrial fibrillation.  They were admitted for tikosyn load.  ? ?Assessment & Plan  ?  ?Persistent atrial fibrillation ?Pt  remains in NSR  on Tikosyn 250 mcg BID  ?Continue Eliquis ?K 4.7, Mg 1.8. Supp Mg ?CHA2DS2VASC is at least 4 ? ?Plan for  home tomorrow if QTc remains stable. ? ?2. OSA ?Encouraged nightly CPAP ? ?3. HTN ?Stable on current regimen  ? ? ?For questions or updates, please contact [place] ?Please consult [email or web address] for contact info under Cardiology/STEMI. ? ?Signed, ?Shirley Friar, PA-C  ?05/10/2021, 7:05 AM  ? ?

## 2021-05-11 ENCOUNTER — Other Ambulatory Visit (HOSPITAL_COMMUNITY): Payer: Self-pay

## 2021-05-11 LAB — BASIC METABOLIC PANEL
Anion gap: 7 (ref 5–15)
BUN: 16 mg/dL (ref 8–23)
CO2: 26 mmol/L (ref 22–32)
Calcium: 9.2 mg/dL (ref 8.9–10.3)
Chloride: 99 mmol/L (ref 98–111)
Creatinine, Ser: 1.01 mg/dL — ABNORMAL HIGH (ref 0.44–1.00)
GFR, Estimated: 58 mL/min — ABNORMAL LOW (ref >60)
Glucose, Bld: 106 mg/dL — ABNORMAL HIGH (ref 70–99)
Potassium: 4.8 mmol/L (ref 3.5–5.1)
Sodium: 132 mmol/L — ABNORMAL LOW (ref 135–145)

## 2021-05-11 LAB — MAGNESIUM: Magnesium: 1.9 mg/dL (ref 1.7–2.4)

## 2021-05-11 MED ORDER — MAGNESIUM OXIDE 400 MG PO TABS
400.0000 mg | ORAL_TABLET | Freq: Two times a day (BID) | ORAL | 6 refills | Status: DC
Start: 2021-05-11 — End: 2023-11-04

## 2021-05-11 MED ORDER — MAGNESIUM SULFATE 2 GM/50ML IV SOLN
2.0000 g | Freq: Once | INTRAVENOUS | Status: AC
  Administered 2021-05-11: 2 g via INTRAVENOUS
  Filled 2021-05-11: qty 50

## 2021-05-11 MED ORDER — DOFETILIDE 250 MCG PO CAPS
250.0000 ug | ORAL_CAPSULE | Freq: Two times a day (BID) | ORAL | 6 refills | Status: DC
  Filled 2021-05-11: qty 60, 30d supply, fill #0

## 2021-05-11 NOTE — Discharge Summary (Signed)
? ? ? ?ELECTROPHYSIOLOGY PROCEDURE DISCHARGE SUMMARY  ? ? ?Patient ID: [name],  ?MRN: [id number], DOB/AGE: [birth year] 76 y.o. ? ?Admit date: 05/08/2021 ?Discharge date: 05/11/2021 ? ?Primary Care Physician: Jinny Sanders, MD  ?Primary Cardiologist: Kathlyn Sacramento, MD  ?Electrophysiologist: Thompson Grayer, MD  ? ?Primary Discharge Diagnosis:  ?1.  Persistent atrial fibrillation status post Tikosyn loading this admission ? ?Secondary Discharge Diagnosis:  ?2. OSA ?3. HTN ?4. Hypomagnesemia ? ?Allergies  ?Allergen Reactions  ? Cortisone Shortness Of Breath and Rash  ? Pravachol [Pravastatin Sodium] Other (See Comments)  ?  Leg pain ?  ? Contrast Media [Iodinated Contrast Media] Rash  ? ? ? ?Procedures This Admission:  ?1.  Tikosyn loading ? ?Brief HPI: ?[name] is a 76 y.o. female with a past medical history as noted above.  They were referred to EP in the outpatient setting for treatment options of atrial fibrillation.  Risks, benefits, and alternatives to Tikosyn were reviewed with the patient who wished to proceed.   ? ?Hospital Course:  ?The patient was admitted and Tikosyn was initiated.  Renal function and electrolytes were followed during the hospitalization.  Their QTc remained stable.  She arrived in NSR and did not require [facility].  They were monitored until discharge on telemetry which demonstrated sinus brady / normal sinus.  On the day of discharge, they were examined by Dr. Caryl Comes  who considered them stable for discharge to home.  Follow-up has been arranged with the Atrial Fibrillation clinic in approximately 1 week and with Dr. Caryl Comes  in 4 weeks.  ? ?Physical Exam: ?Vitals:  ? 05/10/21 1330 05/10/21 1940 05/10/21 2100 05/11/21 0519  ?BP: 112/60 128/67  121/72  ?Pulse: 68 73  67  ?Resp: '18 19  17  '$ ?Temp: 98.2 ?F (36.8 ?C) 98.1 ?F (36.7 ?C)  98 ?F (36.7 ?C)  ?TempSrc: Oral Oral  Oral  ?SpO2: 100% 100%  100%  ?Weight:   75.9 kg   ?Height:   '5\' 5"'$  (1.651 m)   ? ? ?GEN- The patient is well appearing,  alert and oriented x 3 today.   ?HEENT: normocephalic, atraumatic; sclera clear, conjunctiva pink; hearing intact; oropharynx clear; neck supple, no JVP ?Lymph- no cervical lymphadenopathy ?Lungs- Clear to ausculation bilaterally, normal work of breathing.  No wheezes, rales, rhonchi ?Heart- Regular rate and rhythm, no murmurs, rubs or gallops, PMI not laterally displaced ?GI- soft, non-tender, non-distended, bowel sounds present, no hepatosplenomegaly ?Extremities- no clubbing, cyanosis, or edema; DP/PT/radial pulses 2+ bilaterally ?MS- no significant deformity or atrophy ?Skin- warm and dry, no rash or lesion ?Psych- euthymic mood, full affect ?Neuro- strength and sensation are intact ? ? ?Labs: ?  ?Lab Results  ?Component Value Date  ? WBC 7.3 04/12/2021  ? HGB 11.0 (L) 04/12/2021  ? HCT 34.3 (L) 04/12/2021  ? MCV 88.4 04/12/2021  ? PLT 349 04/12/2021  ?  ?Recent Labs  ?Lab 05/11/21 ?0141  ?NA 132*  ?K 4.8  ?CL 99  ?CO2 26  ?BUN 16  ?CREATININE 1.01*  ?CALCIUM 9.2  ?GLUCOSE 106*  ? ? ? ?Discharge Medications:  ?Allergies as of 05/11/2021   ? ?   Reactions  ? Cortisone Shortness Of Breath, Rash  ? Pravachol [pravastatin Sodium] Other (See Comments)  ? Leg pain  ? Contrast Media [iodinated Contrast Media] Rash  ? ?  ? ?  ?Medication List  ?  ? ?STOP taking these medications   ? ?VITAMIN D PO ?  ? ?  ? ?  TAKE these medications   ? ?atorvastatin 10 MG tablet ?Commonly known as: LIPITOR ?TAKE ONE TABLET BY MOUTH AT BEDTIME ?  ?calcium carbonate 1250 (500 Ca) MG tablet ?Commonly known as: OS-CAL - dosed in mg of elemental calcium ?Take 1 tablet by mouth daily with breakfast. ?  ?dofetilide 250 MCG capsule ?Commonly known as: TIKOSYN ?Take 1 capsule (250 mcg total) by mouth 2 (two) times daily. ?  ?Eliquis 5 MG Tabs tablet ?Generic drug: apixaban ?TAKE 1 TABLET BY MOUTH TWICE A DAY ?What changed: how much to take ?  ?hydroxypropyl methylcellulose / hypromellose 2.5 % ophthalmic solution ?Commonly known as: ISOPTO TEARS /  GONIOVISC ?Place 1 drop into both eyes 2 (two) times daily as needed for dry eyes. ?  ?linagliptin 5 MG Tabs tablet ?Commonly known as: Tradjenta ?Take 1 tablet (5 mg total) by mouth daily. ?  ?magnesium oxide 400 MG tablet ?Commonly known as: MAG-OX ?Take 1 tablet (400 mg total) by mouth 2 (two) times daily. ?What changed: when to take this ?  ?metFORMIN 500 MG 24 hr tablet ?Commonly known as: GLUCOPHAGE-XR ?TAKE 2 TABLETS (1,000 MG TOTAL) BY MOUTH 2 (TWO) TIMES DAILY WITH A MEAL. ?  ?metoprolol succinate 25 MG 24 hr tablet ?Commonly known as: Toprol XL ?Take 0.5 tablets (12.5 mg total) by mouth at bedtime. ?  ?Myrbetriq 50 MG Tb24 tablet ?Generic drug: mirabegron ER ?Take 50 mg by mouth daily. ?  ?OneTouch Ultra test strip ?Generic drug: glucose blood ?CHECK BLOOD SUGAR 2 TIMES A DAY AS DIRECTED DX CODE E11.9 ?  ?VITAMIN C PO ?Take 1 tablet by mouth at bedtime. ?  ? ?  ? ? ?Disposition:  ? ? Follow-up Information   ? ? [facility] Follow up.   ?Specialty: Cardiology ?Why: on 3/17 @ 0930 for post hospital follow up ?Contact information: ?[address] ?[phone] mc ?[place] ?[phone] ? ?  ?  ? ?  ?  ? ?  ? ? ?Duration of Discharge Encounter: Greater than 30 minutes including physician time. ? ?Signed, ?Shirley Friar, PA-C  ?05/11/2021 ?11:27 AM ? ? ? ? ?

## 2021-05-11 NOTE — Progress Notes (Signed)
Pharmacy: Dofetilide (Tikosyn) - Follow Up ?Assessment and Electrolyte Replacement ? ?Pharmacy consulted to assist in monitoring and replacing electrolytes in this 76 y.o. female admitted on 05/08/2021 undergoing dofetilide initiation. First dofetilide dose: 250 mcg bid initated 3/7. ? ?Labs: ?   ?Component Value Date/Time  ? K 4.8 05/11/2021 0141  ? MG 1.9 05/11/2021 0141  ?  ? ?Plan: ?Potassium: ?K >/= 4: No additional supplementation needed ? ?Magnesium: ?Mg 1.8-2: Give Mg 2 gm IV x1  ? ? ?As patient has required on average 2g of magnesium replacement every day, recommend discharging patient with prescription for: MagOxide 400 mg BID. ? ?Thank you for allowing pharmacy to participate in this patient's care  ? ?[place] Crane, Pharm D, BCPS, BCCP ?Clinical Pharmacist ? 05/11/2021 7:50 AM  ? ?[facility] pharmacy phone numbers are listed on [email or web address] ? ?

## 2021-05-11 NOTE — Progress Notes (Addendum)
EKG from yesterday evening 05/10/2021 reviewed   ? ?Shows remains in NSR at 67 bpm with stable QTc at ~470 ms. ? ?Continue  Tikosyn 250 mcg BID.  ? ?Home today if QTc remains stable   ? ?She has needed mag several times, Pharm-D recommends increasing home Mag to 400 mg BID.  ? ?Shirley Friar, PA-C  ?Pager: [phone]  ?05/11/2021 7:10 AM  ? ?

## 2021-05-11 NOTE — Care Management Important Message (Signed)
Important Message  Patient Details  Name: [name] MRN: [id number] Date of Birth: [birth year]   Medicare Important Message Given:  Yes     Shelda Altes 05/11/2021, 9:42 AM

## 2021-05-17 ENCOUNTER — Ambulatory Visit (INDEPENDENT_AMBULATORY_CARE_PROVIDER_SITE_OTHER): Payer: Medicare HMO

## 2021-05-17 NOTE — Patient Instructions (Signed)
Visit Information ? ?Thank you for taking time to visit with me today. Please don't hesitate to contact me if I can be of assistance to you before our next scheduled telephone appointment. ? ?Following are the goals we discussed today:  ?Continue to take medications as prescribed   ?Attend all scheduled provider appointments ?Call pharmacy for medication refills 3-7 days in advance of running out of medications ?Call provider office for new concerns or questions  ?Continue to check blood pressure 2- 3 times per week and record ?Continue to check pulse rate daily ?Continue to follow a low salt diet ?Continue to check blood sugars as recommended by your provider ( take blood sugar diary to your provider visit) ?Follow Rule of 15 when managing low blood sugars:  ? RULE OF 15 ?How to treat low blood sugars (Blood sugar less than 70 mg/dl ? Please follow the RULE OF 15 for the treatment of hypoglycemia treatment (When your blood sugars are less than 70 mg/ dl) ?STEP  1:  Take 15 grams of carbohydrates when your blood sugar is low, which includes:  ? 3-4 glucose tabs or ? 3-4 oz of juice or regular soda or ? One tube of glucose gel ?STEP 2:  Recheck blood sugar in 15 minutes ?STEP 3:  If your blood sugar is still low at the 15 minute recheck ---then, go back to STEP 1 and treat again with another 15 grams of carbohydrates ?Call your provider if you have a racing or irregular heartbeat that may be uncomfortable, shortness of breath with or without chest pain, weakness and dizziness.  ?Call 911 for severe symptoms such as chest pain, fainting and struggling to breath.  ?Take blood thinner as prescribed ( notify provider for signs/ symptoms of bleeding: teeth/ gums, in urine, and/ or excessive bruising)  ? ?Our next appointment is by telephone on 07/23/21 at 10:00 am ? ?Please call the care guide team at [phone] if you need to cancel or reschedule your appointment.  ? ?If you are experiencing a Mental Health or  [place] or need someone to talk to, please call the Suicide and Crisis Lifeline: 988 ?call 1-800-273-TALK (toll free, 24 hour hotline)  ? ?Patient verbalizes understanding of instructions and care plan provided today and agrees to view in [place]. Active MyChart status confirmed with patient.   ? ?Quinn Plowman RN,BSN,CCM ?RN Case Manager ?[place]  ?[phone] ? ?

## 2021-05-17 NOTE — Chronic Care Management (AMB) (Signed)
?Chronic Care Management  ? ?CCM RN Visit Note ? ?05/17/2021 ?Name: [name] MRN: [id number] DOB: Aug 10, 1945 ? ?Subjective: ?[name] is a 76 y.o. year old female who is a primary care patient of Bedsole, Amy E, MD. The care management team was consulted for assistance with disease management and care coordination needs.   ? ?Engaged with patient by telephone for follow up visit in response to provider referral for case management and/or care coordination services.  ? ?Consent to Services:  ?The patient was given information about Chronic Care Management services, agreed to services, and gave verbal consent prior to initiation of services.  Please see initial visit note for detailed documentation.  ? ?Patient agreed to services and verbal consent obtained.  ? ?Assessment: Review of patient past medical history, allergies, medications, health status, including review of consultants reports, laboratory and other test data, was performed as part of comprehensive evaluation and provision of chronic care management services.  ? ?SDOH (Social Determinants of Health) assessments and interventions performed:   ? ?Summary: Patient provided all information during this encounter.. See Care Plan below for interventions and patient self-care actives. ? ?Recommendation: The patient will benefit from ongoing follow up with RNCM* ? ?Follow up Plan: Patient would like continued follow-up.  CCM RNCM will outreach the patient within the next 2 months  Patient will call office if needed prior to next encounter  ?CCM Care Plan ? ?Allergies  ?Allergen Reactions  ? Cortisone Shortness Of Breath and Rash  ? Pravachol [Pravastatin Sodium] Other (See Comments)  ?  Leg pain ?  ? Contrast Media [Iodinated Contrast Media] Rash  ? ? ?Outpatient Encounter Medications as of 05/17/2021  ?Medication Sig  ? Ascorbic Acid (VITAMIN C PO) Take 1 tablet by mouth at bedtime. (Patient not taking: Reported on 05/08/2021)  ? atorvastatin (LIPITOR) 10  MG tablet TAKE ONE TABLET BY MOUTH AT BEDTIME (Patient taking differently: Take 10 mg by mouth at bedtime.)  ? calcium carbonate (OS-CAL - DOSED IN MG OF ELEMENTAL CALCIUM) 1250 (500 Ca) MG tablet Take 1 tablet by mouth daily with breakfast. (Patient not taking: Reported on 05/08/2021)  ? dofetilide (TIKOSYN) 250 MCG capsule Take 1 capsule (250 mcg total) by mouth 2 (two) times daily.  ? ELIQUIS 5 MG TABS tablet TAKE 1 TABLET BY MOUTH TWICE A DAY (Patient taking differently: Take 5 mg by mouth 2 (two) times daily.)  ? hydroxypropyl methylcellulose / hypromellose (ISOPTO TEARS / GONIOVISC) 2.5 % ophthalmic solution Place 1 drop into both eyes 2 (two) times daily as needed for dry eyes.  ? linagliptin (TRADJENTA) 5 MG TABS tablet Take 1 tablet (5 mg total) by mouth daily.  ? magnesium oxide (MAG-OX) 400 MG tablet Take 1 tablet (400 mg total) by mouth 2 (two) times daily.  ? metFORMIN (GLUCOPHAGE-XR) 500 MG 24 hr tablet TAKE 2 TABLETS (1,000 MG TOTAL) BY MOUTH 2 (TWO) TIMES DAILY WITH A MEAL.  ? metoprolol succinate (TOPROL XL) 25 MG 24 hr tablet Take 0.5 tablets (12.5 mg total) by mouth at bedtime.  ? MYRBETRIQ 50 MG TB24 tablet Take 50 mg by mouth daily.  ? ONETOUCH ULTRA test strip CHECK BLOOD SUGAR 2 TIMES A DAY AS DIRECTED DX CODE E11.9  ? ?No facility-administered encounter medications on file as of 05/17/2021.  ? ? ?Patient Active Problem List  ? Diagnosis Date Noted  ? Dizziness 02/20/2021  ? Intractable acute post-traumatic headache 02/20/2021  ? Secondary hypercoagulable state ([place]) 11/27/2020  ? Cellulitis  of left lower extremity 10/20/2020  ? Closed fracture of base of fifth metatarsal bone, left, initial encounter 07/26/2020  ? Ear pain, left 07/21/2019  ? Atrial tachycardia ([place]) 09/15/2018  ? 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  ? Status post total hip replacement, 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  ? Lumbar radiculopathy 01/05/2018  ? Vitamin B 12 deficiency   ? Urge incontinence   ? OSA on CPAP   ? History of hiatal hernia   ? GERD  (gastroesophageal reflux disease)   ? Anxiety   ? Other constipation 01/02/2017  ? Class 1 obesity with serious comorbidity and body mass index (BMI) of 30.0 to 30.9 in adult 01/02/2017  ? Overweight (BMI 25.0-29.9) 12/19/2016  ? Persistent atrial fibrillation ([place]) 12/17/2016  ? Vitamin D deficiency 11/20/2016  ? Family history of ovarian cancer 05/24/2016  ? Iron deficiency anemia   ? Hypertension associated with diabetes ([place])   ? Preoperative evaluation to rule out surgical contraindication 05/16/2015  ? Controlled diabetes mellitus type II without complication ([place]) 40/98/1191  ? Memory loss 05/13/2014  ? Mitral regurgitation 10/11/2013  ? Incidental lung nodule 07/16/2011  ? SCIATICA, RIGHT 04/10/2010  ? Hyperlipidemia associated with type 2 diabetes mellitus ([place]) 06/11/2006  ? MDD (major depressive disorder), recurrent episode, mild ([place]) 06/11/2006  ? HEMORRHOIDS, INTERNAL 06/11/2006  ? DIVERTICULOSIS, COLON 06/11/2006  ? STRESS INCONTINENCE 06/11/2006  ? ? ?Conditions to be addressed/monitored:Atrial Fibrillation, HTN, DMII, and Falls ? ?Care Plan : RNCM Plan of care  ?Updates made by Dannielle Karvonen, RN since 05/17/2021 12:00 AM  ?  ? ?Problem: Chronic disease management , education, and/ or care coordination needs.   ?Priority: High  ?  ? ?Long-Range Goal: Development of plan of care to address chronic disease manaement and / or care coordination needs   ?Start Date: 01/23/2021  ?Expected End Date: 08/01/2021  ?Priority: High  ?Note:   ?Current Barriers:  ?Chronic Disease Management support and education needs related to Atrial Fibrillation, HTN, and DMII, falls ?Patient states she is doing well.  She states she tolerated Tikosyn administration well and pulse rate has been stable. Denies any palpitations.   ?RNCM Clinical Goal(s):  ?Patient will verbalize understanding of plan for management of Atrial Fibrillation, HTN, and DMII ?take all medications exactly as prescribed and will call provider for  medication related questions ?attend all scheduled medical appointments:  ?continue to work with RN Care Manager to address care management and care coordination needs related to  Atrial Fibrillation, HTN, and DMII through collaboration with RN Care manager, provider, and care team.  ? ?Interventions: ?1:1 collaboration with primary care provider regarding development and update of comprehensive plan of care as evidenced by provider attestation and co-signature ?Inter-disciplinary care team collaboration (see longitudinal plan of care) ?Evaluation of current treatment plan related to  self management and patient's adherence to plan as established by provider ? ?Hypertension Interventions: Goal on track: yes ?Last practice recorded BP readings:  ?BP Readings from Last 3 Encounters:  ?05/11/21 121/72  ?05/08/21 (!) 156/68  ?04/16/21 (!) 146/70  ?Most recent eGFR/CrCl:  ?Lab Results  ?Component Value Date  ? EGFR 60 05/25/2020  ?  No components found for: CRCL ? ?Evaluation of current treatment plan related to hypertension self management and patient's adherence to plan as established by provider; ?Reviewed medications with patient and discussed importance of compliance; ?Discussed plans with patient for ongoing care management follow up and provided patient with direct contact information for care management team; ?Reviewed scheduled/upcoming provider appointments including:  ?  Advised patient, providing education and rationale to continue to monitor blood pressure and record, calling PCP for findings outside of established parameters ? ?Diabetes Interventions: Goal on track: Yes  Long term goal  ?Assessed patient's understanding of A1c goal: <7% ?Reviewed medications with patient and discussed importance of medication adherence; ?Discussed plans with patient for ongoing care management follow up and provided patient with direct contact information for care management team; ?Reviewed scheduled/upcoming provider  appointments ?Advised patient to continue to monitor blood sugars daily and notify her doctor for findings outside established parameters ?Lab Results  ?Component Value Date  ? HGBA1C 6.9 (A) 03/23/2021  ?AFIB Inte

## 2021-05-18 ENCOUNTER — Ambulatory Visit (HOSPITAL_COMMUNITY)
Admit: 2021-05-18 | Discharge: 2021-05-18 | Disposition: A | Discharge status: Home / Self Care | Payer: Medicare HMO | Attending: Physician Assistant | Admitting: Physician Assistant

## 2021-05-18 ENCOUNTER — Other Ambulatory Visit: Payer: Self-pay

## 2021-05-18 VITALS — BP 146/62 | HR 69 | Ht 65.0 in | Wt 166.0 lb

## 2021-05-18 DIAGNOSIS — D6869 Other thrombophilia: Secondary | ICD-10-CM | POA: Diagnosis not present

## 2021-05-18 DIAGNOSIS — I10 Essential (primary) hypertension: Secondary | ICD-10-CM | POA: Insufficient documentation

## 2021-05-18 DIAGNOSIS — Z7901 Long term (current) use of anticoagulants: Secondary | ICD-10-CM | POA: Diagnosis not present

## 2021-05-18 DIAGNOSIS — E039 Hypothyroidism, unspecified: Secondary | ICD-10-CM | POA: Insufficient documentation

## 2021-05-18 DIAGNOSIS — G4733 Obstructive sleep apnea (adult) (pediatric): Secondary | ICD-10-CM | POA: Insufficient documentation

## 2021-05-18 DIAGNOSIS — R42 Dizziness and giddiness: Secondary | ICD-10-CM | POA: Insufficient documentation

## 2021-05-18 DIAGNOSIS — E119 Type 2 diabetes mellitus without complications: Secondary | ICD-10-CM | POA: Diagnosis not present

## 2021-05-18 DIAGNOSIS — I4819 Other persistent atrial fibrillation: Secondary | ICD-10-CM | POA: Diagnosis not present

## 2021-05-18 LAB — BASIC METABOLIC PANEL
Anion gap: 8 (ref 5–15)
BUN: 13 mg/dL (ref 8–23)
CO2: 26 mmol/L (ref 22–32)
Calcium: 9.4 mg/dL (ref 8.9–10.3)
Chloride: 97 mmol/L — ABNORMAL LOW (ref 98–111)
Creatinine, Ser: 1.1 mg/dL — ABNORMAL HIGH (ref 0.44–1.00)
GFR, Estimated: 53 mL/min — ABNORMAL LOW (ref >60)
Glucose, Bld: 196 mg/dL — ABNORMAL HIGH (ref 70–99)
Potassium: 4.7 mmol/L (ref 3.5–5.1)
Sodium: 131 mmol/L — ABNORMAL LOW (ref 135–145)

## 2021-05-18 LAB — MAGNESIUM: Magnesium: 2 mg/dL (ref 1.7–2.4)

## 2021-05-18 MED ORDER — DOFETILIDE 250 MCG PO CAPS: 250.0000 ug | ORAL_CAPSULE | Freq: Two times a day (BID) | ORAL | 6 refills | Status: DC

## 2021-05-18 NOTE — Progress Notes (Signed)
? ? ?Primary Care Physician: Jinny Sanders, MD ?Primary Cardiologist: Dr Fletcher Anon ?Primary Electrophysiologist: Dr Caryl Comes ?Referring Physician: Dr Rayann Heman ? ? ?[name] is a 76 y.o. female with a history of anemia, DM, HTN, hypothyroidism, OSA, MR, atrial fibrillation who presents for follow up in the [facility]. Patient is on Eliquis for a CHADS2VASC score of 4. She was seen at the ED 11/21/20 for afib with RVR and underwent DCCV at that time. Dizziness is typically associated with her afib in the past although she does have a history of vertigo. Patient has been having more issues with dizziness and falls recently.  ? ?On follow up today, s/p dofetilide admission 3/7-3/10/23. Patient reports that she feels much better since starting the medication. She only had one episode of mild dizziness on standing. She is tolerating the medication without difficulty.  ? ?Today, she denies symptoms of palpitations, chest pain, shortness of breath, orthopnea, PND, lower extremity edema, presyncope, syncope, snoring, daytime somnolence, bleeding, or neurologic sequela. The patient is tolerating medications without difficulties and is otherwise without complaint today.  ? ? ?Atrial Fibrillation Risk Factors: ? ?she does have symptoms or diagnosis of sleep apnea. ?she is compliant with CPAP therapy. ?she does not have a history of rheumatic fever. ? ? ?she has a BMI of Body mass index is 27.62 kg/m?Marland KitchenMarland Kitchen ?Filed Weights  ? 05/18/21 0927  ?Weight: 75.3 kg  ? ? ? ? ?Family History  ?Problem Relation Age of Onset  ? Cancer Mother   ?     fallopian tube  ? Dementia Mother   ? Pulmonary embolism Mother   ? Hyperlipidemia Mother   ? Diabetes Mother   ? Hyperlipidemia Father   ? Lung cancer Father   ? Hypertension Sister   ? Diabetes Brother   ? Cancer Brother   ? Stroke Paternal Grandmother   ? Stroke Paternal Grandfather   ? Kidney cancer Son   ? Dementia Maternal Aunt   ? Colon cancer Neg Hx   ? Esophageal  cancer Neg Hx   ? ? ? ?Atrial Fibrillation Management history: ? ?Previous antiarrhythmic drugs: flecainide, dofetilide  ?Previous cardioversions: 2018 x 2, 2020, 2021 x 2, 11/21/20, 03/24/21 ?Previous ablations: 12/17/16 ?CHADS2VASC score: 4 ?Anticoagulation history: Eliquis ? ? ?Past Medical History:  ?Diagnosis Date  ? Anemia   ? Anxiety   ? Atrial fibrillation ([place])   ? Colon polyps   ? Constipation   ? Depressive disorder, not elsewhere classified   ? Diabetes mellitus without complication ([place])   ? Diaphragmatic hernia without mention of obstruction or gangrene   ? Disorders of bursae and tendons in shoulder region, unspecified   ? Diverticulosis of colon (without mention of hemorrhage)   ? External hemorrhoid   ? Fundic gland polyps of stomach, benign   ? GERD (gastroesophageal reflux disease)   ? History of hiatal hernia   ? HTN (hypertension)   ? Hypertension   ? Hypothyroidism   ? Internal hemorrhoids without mention of complication   ? Moderate mitral regurgitation   ? Obesity, unspecified   ? OSA on CPAP   ? compliant with CPAP  ? Other urinary incontinence   ? Persistent atrial fibrillation ([place])   ? Pure hypercholesterolemia   ? Type II diabetes mellitus ([place])   ? Urinary frequency   ? Urinary urgency   ? Vitamin B 12 deficiency   ? ?Past Surgical History:  ?Procedure Laterality Date  ? ABDOMINAL HYSTERECTOMY    ?  ABDOMINAL HYSTERECTOMY  1972  ? Have both ovaries  ? ATRIAL FIBRILLATION ABLATION  12/17/2016  ? ATRIAL FIBRILLATION ABLATION N/A 12/17/2016  ? Procedure: ATRIAL FIBRILLATION ABLATION;  Surgeon: Thompson Grayer, MD;  Location: [facility];  Service: Cardiovascular;  Laterality: N/A;  ? CARDIAC CATHETERIZATION  1990s  ? [facility]; "Dr. [name]"  ? CARDIOVERSION N/A 05/27/2016  ? Procedure: CARDIOVERSION;  Surgeon: [name], MD;  Location: [facility];  Service: Cardiovascular;  Laterality: N/A;  ? CARDIOVERSION N/A 11/25/2016  ? Procedure: CARDIOVERSION;  Surgeon: [name], MD;   Location: [facility];  Service: Cardiovascular;  Laterality: N/A;  ? CARDIOVERSION N/A 09/18/2018  ? Procedure: CARDIOVERSION;  Surgeon: [name], MD;  Location: [facility];  Service: Cardiovascular;  Laterality: N/A;  ? CARDIOVERSION N/A 07/28/2019  ? Procedure: CARDIOVERSION;  Surgeon: Kate Sable, MD;  Location: [facility];  Service: Cardiovascular;  Laterality: N/A;  ? CARDIOVERSION N/A 09/07/2019  ? Procedure: CARDIOVERSION;  Surgeon: Nelva Bush, MD;  Location: [facility];  Service: Cardiovascular;  Laterality: N/A;  ? CATARACT EXTRACTION W/ INTRAOCULAR LENS  IMPLANT, BILATERAL Bilateral   ? COLONOSCOPY    ? ELECTROPHYSIOLOGIC STUDY N/A 01/24/2016  ? Procedure: CARDIOVERSION;  Surgeon: [name], MD;  Location: [facility];  Service: Cardiovascular;  Laterality: N/A;  ? implantable loop recorder implantation  10/18/2019  ? Medtronic Reveal [place] model [id number]) implantable loop recorder   ? LAPAROSCOPIC CHOLECYSTECTOMY    ? REDUCTION MAMMAPLASTY Bilateral   ? TONSILLECTOMY    ? TOTAL HIP ARTHROPLASTY Right 05/12/2018  ? Procedure: TOTAL HIP ARTHROPLASTY ANTERIOR APPROACH;  Surgeon: Paralee Cancel, MD;  Location: [facility];  Service: Orthopedics;  Laterality: Right;  70 mins  ? ? ?Current Outpatient Medications  ?Medication Sig Dispense Refill  ? Ascorbic Acid (VITAMIN C PO) Take 1 tablet by mouth at bedtime.    ? atorvastatin (LIPITOR) 10 MG tablet TAKE ONE TABLET BY MOUTH AT BEDTIME (Patient taking differently: Take 10 mg by mouth at bedtime.) 90 tablet 1  ? calcium carbonate (OS-CAL - DOSED IN MG OF ELEMENTAL CALCIUM) 1250 (500 Ca) MG tablet Take 1 tablet by mouth daily with breakfast.    ? ELIQUIS 5 MG TABS tablet TAKE 1 TABLET BY MOUTH TWICE A DAY (Patient taking differently: Take 5 mg by mouth 2 (two) times daily.) 60 tablet 9  ? hydroxypropyl methylcellulose / hypromellose (ISOPTO TEARS / GONIOVISC) 2.5 % ophthalmic solution Place 1 drop into both eyes 2 (two) times daily as needed for  dry eyes.    ? linagliptin (TRADJENTA) 5 MG TABS tablet Take 1 tablet (5 mg total) by mouth daily. 90 tablet 3  ? magnesium oxide (MAG-OX) 400 MG tablet Take 1 tablet (400 mg total) by mouth 2 (two) times daily. 60 tablet 6  ? metFORMIN (GLUCOPHAGE-XR) 500 MG 24 hr tablet TAKE 2 TABLETS (1,000 MG TOTAL) BY MOUTH 2 (TWO) TIMES DAILY WITH A MEAL. 360 tablet 1  ? metoprolol succinate (TOPROL XL) 25 MG 24 hr tablet Take 0.5 tablets (12.5 mg total) by mouth at bedtime. 30 tablet 3  ? MYRBETRIQ 50 MG TB24 tablet Take 50 mg by mouth daily.    ? ONETOUCH ULTRA test strip CHECK BLOOD SUGAR 2 TIMES A DAY AS DIRECTED DX CODE E11.9 100 strip 7  ? dofetilide (TIKOSYN) 250 MCG capsule Take 1 capsule (250 mcg total) by mouth 2 (two) times daily. 60 capsule 6  ? ?No current facility-administered medications for this encounter.  ? ? ?  Allergies  ?Allergen Reactions  ? Cortisone Shortness Of Breath and Rash  ? Pravachol [Pravastatin Sodium] Other (See Comments)  ?  Leg pain ?  ? Contrast Media [Iodinated Contrast Media] Rash  ? ? ?Social History  ? ?Socioeconomic History  ? Marital status: Married  ?  Spouse name: Laverna Peace  ? Number of children: 2  ? Years of education: Not on file  ? Highest education level: Not on file  ?Occupational History  ? Not on file  ?Tobacco Use  ? Smoking status: Never  ? Smokeless tobacco: Never  ?Vaping Use  ? Vaping Use: Never used  ?Substance and Sexual Activity  ? Alcohol use: No  ?  Alcohol/week: 0.0 standard drinks  ? Drug use: No  ? Sexual activity: Yes  ?Other Topics Concern  ? Not on file  ?Social History Narrative  ? ** Merged History Encounter **  ?    ? Exercise walks 3 times daily ?Lives with spouse in [place] ?Homemaker - retired from employment ?1 son and 1 daughter ? ?Diet fruit, occ fast food, salads and lean meat ? ?3 tea/day, no EtOH, no tobacco, no drugs ?  ? ?Social Determinants of Health  ? ?Financial Resource Strain: Low Risk   ? Difficulty of Paying Living Expenses: Not very hard   ?Food Insecurity: No Food Insecurity  ? Worried About Charity fundraiser in the Last Year: Never true  ? Ran Out of Food in the Last Year: Never true  ?Transportation Needs: No Transportation Needs  ? Lack of Tr

## 2021-05-21 ENCOUNTER — Ambulatory Visit (INDEPENDENT_AMBULATORY_CARE_PROVIDER_SITE_OTHER): Payer: Medicare HMO

## 2021-05-21 ENCOUNTER — Ambulatory Visit (HOSPITAL_COMMUNITY): Payer: Medicare HMO | Admitting: Physician Assistant

## 2021-05-21 DIAGNOSIS — I4819 Other persistent atrial fibrillation: Secondary | ICD-10-CM

## 2021-05-22 LAB — CUP PACEART REMOTE DEVICE CHECK
Date Time Interrogation Session: 20230321153847
Implantable Pulse Generator Implant Date: 20210816

## 2021-05-31 NOTE — Progress Notes (Signed)
Carelink Summary Report / Loop Recorder 

## 2021-06-01 ENCOUNTER — Other Ambulatory Visit: Payer: Self-pay | Admitting: Internal Medicine

## 2021-06-01 DIAGNOSIS — E1159 Type 2 diabetes mellitus with other circulatory complications: Secondary | ICD-10-CM | POA: Diagnosis not present

## 2021-06-01 DIAGNOSIS — I10 Essential (primary) hypertension: Secondary | ICD-10-CM | POA: Diagnosis not present

## 2021-06-01 DIAGNOSIS — I4891 Unspecified atrial fibrillation: Secondary | ICD-10-CM

## 2021-06-01 DIAGNOSIS — Z7984 Long term (current) use of oral hypoglycemic drugs: Secondary | ICD-10-CM | POA: Diagnosis not present

## 2021-06-05 ENCOUNTER — Other Ambulatory Visit (HOSPITAL_COMMUNITY): Payer: Self-pay | Admitting: *Deleted

## 2021-06-05 MED ORDER — METOPROLOL SUCCINATE ER 25 MG PO TB24: 12.5000 mg | ORAL_TABLET | Freq: Every day | ORAL | 3 refills | Status: DC

## 2021-06-19 ENCOUNTER — Ambulatory Visit (INDEPENDENT_AMBULATORY_CARE_PROVIDER_SITE_OTHER): Payer: Medicare HMO | Admitting: Internal Medicine

## 2021-06-19 ENCOUNTER — Encounter: Payer: Self-pay | Admitting: Internal Medicine

## 2021-06-19 VITALS — BP 144/80 | HR 78 | Ht 65.0 in | Wt 167.0 lb

## 2021-06-19 DIAGNOSIS — I471 Supraventricular tachycardia: Secondary | ICD-10-CM

## 2021-06-19 DIAGNOSIS — I4819 Other persistent atrial fibrillation: Secondary | ICD-10-CM | POA: Diagnosis not present

## 2021-06-19 DIAGNOSIS — Z79899 Other long term (current) drug therapy: Secondary | ICD-10-CM | POA: Diagnosis not present

## 2021-06-19 DIAGNOSIS — I10 Essential (primary) hypertension: Secondary | ICD-10-CM

## 2021-06-19 LAB — PACEMAKER DEVICE OBSERVATION

## 2021-06-19 NOTE — Progress Notes (Signed)
? ? ? ? ?Patient Care Team: ?Jinny Sanders, MD as PCP - General (Family Medicine) ?[name], MD as PCP - Cardiology (Cardiology) ?Thompson Grayer, MD as PCP - Electrophysiology (Cardiology) ?[name], MD as Consulting Physician (Internal Medicine) ?[name], MD as Consulting Physician (Cardiology) ?Wilhelmina Mcardle, MD (Inactive) as Consulting Physician (Pulmonary Disease) ?Jinny Sanders, MD ?Dannielle Karvonen, RN as Case Manager ? ? ?HPI ? ?[name] is a 76 y.o. female in today having seen Dr. Lovena Le 1/23 and previously frequently by Dr. Rayann Heman.  PVI/CTI 10/18 she has a history of atrial fibrillation for which she has been treated with flecainide 100 twice daily with anticoagulation with Eliquis ? ?Thereafter, she developed palpitations.  Was seen in the emergency room and underwent cardioversion. ?Because of falls was given an event recorder>> recurrent falls and ended up in the ER 2/23 ? ?She has had in the past falls associated with atrial fibrillation onset.  More recently she has had falls unassociated with atrial fibrillation.  She is also an atrial tachycardia  identified by RU-PA for which her loop recorder was reprogrammed; she has had multiple episodes of tachycardia with a ventricular rate about 130-140, regular suggestive of atrial flutters, perhaps post ablation.  On the day of her fall, her heart rate on arrival was about 110 without distinct P waves. ? ?Her flecainide was determined to be inadequate and she was started on dofetilide  ? ?Significantly improved.  Still some palpitations but much less dizziness.  Breathing is better.  No chest pain.  Tolerating the dofetilide.  Complaining of fatigue ?Sleep disordered breathing--sleep apnea diagnosis.  CPAP but not using ?Antiarrhythmics Date Reason stopped  ?Flecainide / Ineffective  ?Dofetilide 3/23   ? ?  ? ?DATE TEST EF   ?2/22 Echo   55-60 %   ?     ?     ? ?Date Cr K Hgb  ?3/23 1.10 4.7 11 (2/23)  ?       ? ?Thromboembolic risk factors ( age -94 , DM-1, Gender-1) for a CHADSVASc Score of .=3 ? ? ? ? ?Records and Results Reviewed  ? ?Past Medical History:  ?Diagnosis Date  ? Anemia   ? Anxiety   ? Atrial fibrillation ([place])   ? Colon polyps   ? Constipation   ? Depressive disorder, not elsewhere classified   ? Diabetes mellitus without complication ([place])   ? Diaphragmatic hernia without mention of obstruction or gangrene   ? Disorders of bursae and tendons in shoulder region, unspecified   ? Diverticulosis of colon (without mention of hemorrhage)   ? External hemorrhoid   ? Fundic gland polyps of stomach, benign   ? GERD (gastroesophageal reflux disease)   ? History of hiatal hernia   ? HTN (hypertension)   ? Hypertension   ? Hypothyroidism   ? Internal hemorrhoids without mention of complication   ? Moderate mitral regurgitation   ? Obesity, unspecified   ? OSA on CPAP   ? compliant with CPAP  ? Other urinary incontinence   ? Persistent atrial fibrillation ([place])   ? Pure hypercholesterolemia   ? Type II diabetes mellitus ([place])   ? Urinary frequency   ? Urinary urgency   ? Vitamin B 12 deficiency   ? ? ?Past Surgical History:  ?Procedure Laterality Date  ? ABDOMINAL HYSTERECTOMY    ? ABDOMINAL HYSTERECTOMY  1972  ? Have both ovaries  ? ATRIAL FIBRILLATION ABLATION  12/17/2016  ?  ATRIAL FIBRILLATION ABLATION N/A 12/17/2016  ? Procedure: ATRIAL FIBRILLATION ABLATION;  Surgeon: Thompson Grayer, MD;  Location: [facility];  Service: Cardiovascular;  Laterality: N/A;  ? CARDIAC CATHETERIZATION  1990s  ? [facility]; "Dr. [name]"  ? CARDIOVERSION N/A 05/27/2016  ? Procedure: CARDIOVERSION;  Surgeon: [name], MD;  Location: [facility];  Service: Cardiovascular;  Laterality: N/A;  ? CARDIOVERSION N/A 11/25/2016  ? Procedure: CARDIOVERSION;  Surgeon: [name], MD;  Location: [facility];  Service: Cardiovascular;  Laterality: N/A;  ? CARDIOVERSION N/A 09/18/2018  ? Procedure: CARDIOVERSION;  Surgeon: [name],  MD;  Location: [facility];  Service: Cardiovascular;  Laterality: N/A;  ? CARDIOVERSION N/A 07/28/2019  ? Procedure: CARDIOVERSION;  Surgeon: Kate Sable, MD;  Location: [facility];  Service: Cardiovascular;  Laterality: N/A;  ? CARDIOVERSION N/A 09/07/2019  ? Procedure: CARDIOVERSION;  Surgeon: Nelva Bush, MD;  Location: [facility];  Service: Cardiovascular;  Laterality: N/A;  ? CATARACT EXTRACTION W/ INTRAOCULAR LENS  IMPLANT, BILATERAL Bilateral   ? COLONOSCOPY    ? ELECTROPHYSIOLOGIC STUDY N/A 01/24/2016  ? Procedure: CARDIOVERSION;  Surgeon: [name], MD;  Location: [facility];  Service: Cardiovascular;  Laterality: N/A;  ? implantable loop recorder implantation  10/18/2019  ? Medtronic Reveal [place] model [id number]) implantable loop recorder   ? LAPAROSCOPIC CHOLECYSTECTOMY    ? REDUCTION MAMMAPLASTY Bilateral   ? TONSILLECTOMY    ? TOTAL HIP ARTHROPLASTY Right 05/12/2018  ? Procedure: TOTAL HIP ARTHROPLASTY ANTERIOR APPROACH;  Surgeon: Paralee Cancel, MD;  Location: [facility];  Service: Orthopedics;  Laterality: Right;  70 mins  ? ? ?Current Meds  ?Medication Sig  ? Ascorbic Acid (VITAMIN C PO) Take 1 tablet by mouth at bedtime.  ? atorvastatin (LIPITOR) 10 MG tablet TAKE ONE TABLET BY MOUTH AT BEDTIME (Patient taking differently: Take 10 mg by mouth at bedtime.)  ? calcium carbonate (OS-CAL - DOSED IN MG OF ELEMENTAL CALCIUM) 1250 (500 Ca) MG tablet Take 1 tablet by mouth daily with breakfast.  ? dofetilide (TIKOSYN) 250 MCG capsule Take 1 capsule (250 mcg total) by mouth 2 (two) times daily.  ? ELIQUIS 5 MG TABS tablet TAKE 1 TABLET BY MOUTH TWICE A DAY (Patient taking differently: Take 5 mg by mouth 2 (two) times daily.)  ? hydroxypropyl methylcellulose / hypromellose (ISOPTO TEARS / GONIOVISC) 2.5 % ophthalmic solution Place 1 drop into both eyes 2 (two) times daily as needed for dry eyes.  ? linagliptin (TRADJENTA) 5 MG TABS tablet Take 1 tablet (5 mg total) by mouth daily.  ? magnesium oxide  (MAG-OX) 400 MG tablet Take 1 tablet (400 mg total) by mouth 2 (two) times daily.  ? metFORMIN (GLUCOPHAGE-XR) 500 MG 24 hr tablet TAKE TWO TABLETS BY MOUTH TWICE DAILY WITH MEALS  ? metoprolol succinate (TOPROL XL) 25 MG 24 hr tablet Take 0.5 tablets (12.5 mg total) by mouth at bedtime.  ? MYRBETRIQ 50 MG TB24 tablet Take 50 mg by mouth daily.  ? ONETOUCH ULTRA test strip CHECK BLOOD SUGAR 2 TIMES A DAY AS DIRECTED DX CODE E11.9  ? ? ?Allergies  ?Allergen Reactions  ? Cortisone Shortness Of Breath and Rash  ? Pravachol [Pravastatin Sodium] Other (See Comments)  ?  Leg pain ?  ? Contrast Media [Iodinated Contrast Media] Rash  ? ? ? ? ?Review of Systems negative except from HPI and PMH ? ?Physical Exam ?BP (!) 144/80 (BP Location: Left Arm, Patient Position: Sitting, Cuff Size: Normal)   Pulse  78   Ht '5\' 5"'$  (1.651 m)   Wt 167 lb (75.8 kg)   LMP  (LMP Unknown)   SpO2 99%   BMI 27.79 kg/m?  ?Well developed and well nourished in no acute distress ?HENT normal ?Neck supple with JVP-flat ?Clear ?Device pocket well healed; without hematoma or erythema.  There is no tethering  ?Regular rate and rhythm, no gallop No / murmur ?Abd-soft with active BS ?No Clubbing cyanosis edema ?Skin-warm and dry ?A & Oriented  Grossly normal sensory and motor function ? ?ECG sinus at 78 ?Intervals 18/08/40 ?P waves are biphasic inferiorly  ? ?CrCl cannot be calculated (Patient's most recent lab result is older than the maximum 21 days allowed.). ? ? ?Assessment and  Plan ? ?Atrial flutter-multiple variable ventricular responses ? ?Falls/dizziness question mechanism ? ?Dizziness ? ?High Risk Medication Surveillance Dofetilide ? ?Significantly improved on dofetilide.  This is true not withstanding the fact that she has multiple episodes with the overall burden of time is significantly diminished.  The number of days in which she has predominantly tachycardia also significantly diminished.  We will continue.   ? ?We discussed strategies  in the event that this is insufficient and what consider catheter ablation at that point and probably refer her to Dr Noralee Stain. ? ?Have encouraged her to follow through on her urological procedures ? ?5 Month followup  ?

## 2021-06-19 NOTE — Patient Instructions (Signed)
Medication Instructions:  ?- Your physician recommends that you continue on your current medications as directed. Please refer to the Current Medication list given to you today. ? ?*If you need a refill on your cardiac medications before your next appointment, please call your pharmacy* ? ? ?Lab Work: ?- none ordered ? ?If you have labs (blood work) drawn today and your tests are completely normal, you will receive your results only by: ?MyChart Message (if you have MyChart) OR ?A paper copy in the mail ?If you have any lab test that is abnormal or we need to change your treatment, we will call you to review the results. ? ? ?Testing/Procedures: ?- none ordered ? ? ?Follow-Up: ?At [facility], you and your health needs are our priority.  As part of our continuing mission to provide you with exceptional heart care, we have created designated Provider Care Teams.  These Care Teams include your primary Cardiologist (physician) and Advanced Practice Providers (APPs -  Physician Assistants and Nurse Practitioners) who all work together to provide you with the care you need, when you need it. ? ?We recommend signing up for the patient portal called "MyChart".  Sign up information is provided on this After Visit Summary.  MyChart is used to connect with patients for Virtual Visits (Telemedicine).  Patients are able to view lab/test results, encounter notes, upcoming appointments, etc.  Non-urgent messages can be sent to your provider as well.   ?To learn more about what you can do with MyChart, go to [email or web address].   ? ?Your next appointment:   ?5 month(s) ? ?The format for your next appointment:   ?In Person ? ?Provider:   ?Virl Axe, MD  ? ? ?Other Instructions ?N/a ? ?Important Information About Sugar ? ? ? ? ? ? ?

## 2021-06-25 ENCOUNTER — Ambulatory Visit (INDEPENDENT_AMBULATORY_CARE_PROVIDER_SITE_OTHER): Payer: Medicare HMO

## 2021-06-25 DIAGNOSIS — I4819 Other persistent atrial fibrillation: Secondary | ICD-10-CM

## 2021-06-26 LAB — CUP PACEART REMOTE DEVICE CHECK
Date Time Interrogation Session: 20230423012930
Implantable Pulse Generator Implant Date: 20210816

## 2021-07-02 ENCOUNTER — Telehealth: Payer: Self-pay | Admitting: Cardiovascular Disease

## 2021-07-02 NOTE — Telephone Encounter (Signed)
? ?  Pre-operative Risk Assessment  ?  ?Patient Name: [name]  ?DOB: Jan 05, 1946 ?MRN: [id number]  ? ?  ? ?Request for Surgical Clearance   ? ?Procedure:  peripheral nerve evaluation  ? ?Date of Surgery:  Clearance 07/31/21                              ?   ?Surgeon:  Dr. Matilde Sprang  ?Surgeon's Group or Practice Name:  [facility] ?Phone number:  [phone] ?Fax number:  [phone] ?  ?Type of Clearance Requested:   ?- Medical  ?- Pharmacy:  Hold Apixaban (Eliquis) 2 days prior ?  ?Type of Anesthesia:  IV sedation ?  ?Additional requests/questions:   ? ?Signed, ?Selena Zobro   ?07/02/2021, 10:47 AM   ?

## 2021-07-02 NOTE — Telephone Encounter (Signed)
? ?  Patient Name: [name]  ?DOB: 12-27-1945 ?MRN: [id number] ? ?Primary Cardiologist: Kathlyn Sacramento, MD / EP - Dr. Caryl Comes ? ?Chart reviewed as part of pre-operative protocol coverage. Recent OV 06/19/21 reviewed. I reached out to patient to ensure she's been doing well. The patient affirms she has been doing well without any new cardiac symptoms. Therefore, the patient would be at acceptable risk for the planned procedure without further cardiovascular testing. The patient was advised that if she develops new symptoms prior to surgery to contact our office to arrange for a follow-up visit, and she verbalized understanding. ? ?Per office protocol, patient can hold Eliquis for 3 days prior to procedure.   ? ?Will route this bundled recommendation to requesting provider via Epic fax function. Please call with questions. ? ? ?Charlie Pitter, PA-C ?07/02/2021, 2:15 PM ? ? ?

## 2021-07-02 NOTE — Telephone Encounter (Signed)
Will route to pharm for input on anticoag. Last OV 06/19/21 reviewed, much improved per notes. Tikosyn started 05/2021 without recent cardioversion in the last 4 weeks. ?

## 2021-07-02 NOTE — Telephone Encounter (Signed)
Patient with diagnosis of afib on Eliquis for anticoagulation.   ? ?Procedure: peripheral nerve evaluation ?Date of procedure: 07/31/21 ? ?CHA2DS2-VASc Score = 4  ?This indicates a 4.8% annual risk of stroke. ?The patient's score is based upon: ?CHF History: 0 ?HTN History: 1 ?Diabetes History: 1 ?Stroke History: 0 ?Vascular Disease History: 0 ?Age Score: 1 ?Gender Score: 1 ? ?Started on Tikosyn 05/08/21. ? ?CrCl 63m/min ?Platelet count 349K ? ?Per office protocol, patient can hold Eliquis for 3 days prior to procedure.   ?

## 2021-07-04 ENCOUNTER — Encounter: Payer: Self-pay | Admitting: Internal Medicine

## 2021-07-11 NOTE — Progress Notes (Signed)
Carelink Summary Report / Loop Recorder 

## 2021-07-16 ENCOUNTER — Telehealth: Payer: Self-pay | Admitting: *Deleted

## 2021-07-16 NOTE — Telephone Encounter (Signed)
Received letter in mail from [facility] stating on 05/03/2021 FDA identified the recall of multiple products distributed by [employer], as a Class II recall.  This recall was issued due to CGMP Deviations following an FDA inspection.  [name] was listed as a possible recipient of atorvastatin 10 mg from [employer].  I spoke with pharmacist at [facility] and they advised me they were notified of the recall and the lot number effected was not dispensed to patient.  ?

## 2021-07-16 NOTE — Telephone Encounter (Signed)
Noted  

## 2021-07-23 ENCOUNTER — Ambulatory Visit (INDEPENDENT_AMBULATORY_CARE_PROVIDER_SITE_OTHER): Payer: Medicare HMO

## 2021-07-23 DIAGNOSIS — R296 Repeated falls: Secondary | ICD-10-CM

## 2021-07-23 DIAGNOSIS — E119 Type 2 diabetes mellitus without complications: Secondary | ICD-10-CM

## 2021-07-23 DIAGNOSIS — I152 Hypertension secondary to endocrine disorders: Secondary | ICD-10-CM

## 2021-07-23 DIAGNOSIS — E1159 Type 2 diabetes mellitus with other circulatory complications: Secondary | ICD-10-CM

## 2021-07-23 DIAGNOSIS — I4819 Other persistent atrial fibrillation: Secondary | ICD-10-CM

## 2021-07-23 NOTE — Chronic Care Management (AMB) (Signed)
Chronic Care Management   CCM RN Visit Note  07/23/2021 Name: [name] MRN: [id number] DOB: 10-28-1945  Subjective: [name] is a 76 y.o. year old female who is a primary care patient of Bedsole, Amy E, MD. The care management team was consulted for assistance with disease management and care coordination needs.    Engaged with patient by telephone for follow up visit in response to provider referral for case management and/or care coordination services.   Consent to Services:  The patient was given information about Chronic Care Management services, agreed to services, and gave verbal consent prior to initiation of services.  Please see initial visit note for detailed documentation.   Patient agreed to services and verbal consent obtained.   Assessment: Review of patient past medical history, allergies, medications, health status, including review of consultants reports, laboratory and other test data, was performed as part of comprehensive evaluation and provision of chronic care management services.   SDOH (Social Determinants of Health) assessments and interventions performed:    CCM Care Plan  Allergies  Allergen Reactions   Cortisone Shortness Of Breath and Rash   Pravachol [Pravastatin Sodium] Other (See Comments)    Leg pain    Contrast Media [Iodinated Contrast Media] Rash    Outpatient Encounter Medications as of 07/23/2021  Medication Sig   Ascorbic Acid (VITAMIN C PO) Take 1 tablet by mouth at bedtime.   atorvastatin (LIPITOR) 10 MG tablet TAKE ONE TABLET BY MOUTH AT BEDTIME (Patient taking differently: Take 10 mg by mouth at bedtime.)   calcium carbonate (OS-CAL - DOSED IN MG OF ELEMENTAL CALCIUM) 1250 (500 Ca) MG tablet Take 1 tablet by mouth daily with breakfast.   dofetilide (TIKOSYN) 250 MCG capsule Take 1 capsule (250 mcg total) by mouth 2 (two) times daily.   ELIQUIS 5 MG TABS tablet TAKE 1 TABLET BY MOUTH TWICE A DAY (Patient taking differently:  Take 5 mg by mouth 2 (two) times daily.)   hydroxypropyl methylcellulose / hypromellose (ISOPTO TEARS / GONIOVISC) 2.5 % ophthalmic solution Place 1 drop into both eyes 2 (two) times daily as needed for dry eyes.   linagliptin (TRADJENTA) 5 MG TABS tablet Take 1 tablet (5 mg total) by mouth daily.   magnesium oxide (MAG-OX) 400 MG tablet Take 1 tablet (400 mg total) by mouth 2 (two) times daily.   metFORMIN (GLUCOPHAGE-XR) 500 MG 24 hr tablet TAKE TWO TABLETS BY MOUTH TWICE DAILY WITH MEALS   metoprolol succinate (TOPROL XL) 25 MG 24 hr tablet Take 0.5 tablets (12.5 mg total) by mouth at bedtime.   MYRBETRIQ 50 MG TB24 tablet Take 50 mg by mouth daily.   ONETOUCH ULTRA test strip CHECK BLOOD SUGAR 2 TIMES A DAY AS DIRECTED DX CODE E11.9   No facility-administered encounter medications on file as of 07/23/2021.    Patient Active Problem List   Diagnosis Date Noted   Dizziness 02/20/2021   Intractable acute post-traumatic headache 02/20/2021   Secondary hypercoagulable state ([place]) 11/27/2020   Cellulitis of left lower extremity 10/20/2020   Closed fracture of base of fifth metatarsal bone, left, initial encounter 07/26/2020   Ear pain, left 07/21/2019   Atrial tachycardia ([place]) 09/15/2018   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   Status post total hip replacement, 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   Lumbar radiculopathy 01/05/2018   Vitamin B 12 deficiency    Urge incontinence    OSA on CPAP    History of hiatal hernia    GERD (gastroesophageal reflux  disease)    Anxiety    Other constipation 01/02/2017   Class 1 obesity with serious comorbidity and body mass index (BMI) of 30.0 to 30.9 in adult 01/02/2017   Overweight (BMI 25.0-29.9) 12/19/2016   Persistent atrial fibrillation ([place]) 12/17/2016   Vitamin D deficiency 11/20/2016   Family history of ovarian cancer 05/24/2016   Iron deficiency anemia    Hypertension associated with diabetes ([place])    Preoperative evaluation to rule out surgical contraindication  05/16/2015   Controlled diabetes mellitus type II without complication ([place]) 16/12/9602   Memory loss 05/13/2014   Mitral regurgitation 10/11/2013   Incidental lung nodule 07/16/2011   SCIATICA, RIGHT 04/10/2010   Hyperlipidemia associated with type 2 diabetes mellitus ([place]) 06/11/2006   MDD (major depressive disorder), recurrent episode, 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   Priority: High     Long-Range Goal: Development of plan of care to address chronic disease manaement and / or care coordination needs   Start Date: 01/23/2021  Expected End Date: 11/01/2021  Priority: High  Note:   Current Barriers:  Chronic Disease Management support and education needs related to Atrial Fibrillation, HTN, and DMII, falls Patient states she is doing well.   She states the Tikosyn seems to be working. Denies any A-fib events.  Patient reports pulse rate ranging 60-80.  Last cardiology visit was 06/19/21.   Patient denies having any falls since last outreach with RNCM.  Patient states she did not check her fasting blood sugar this morning due to her husband having surgery. She reports her fasting blood sugars are ranging in the 130's.  Patient states her next endocrinology appointment is 07/26/21.    RNCM Clinical Goal(s):  Patient will verbalize understanding of plan for management of Atrial Fibrillation, HTN, and DMII take all medications exactly as prescribed and will call provider for medication related questions attend all scheduled medical appointments:  continue to work with RN Care Manager to address care management and care coordination needs related to  Atrial  Fibrillation, HTN, and DMII through collaboration with RN Care manager, provider, and care team.   Interventions: 1:1 collaboration with primary care provider regarding development and update of comprehensive plan of care as evidenced by provider attestation and co-signature Inter-disciplinary care team collaboration (see longitudinal plan of care) Evaluation of current treatment plan related to  self management and patient's adherence to plan as established by provider  Hypertension Interventions: Goal on track: yes Last practice recorded BP readings:  BP Readings from Last 3 Encounters:  06/19/21 (!) 144/80  05/18/21 (!) 146/62  05/11/21 121/72  Most recent eGFR/CrCl:  Lab Results  Component Value Date   EGFR 60 05/25/2020    No components found for: CRCL  Evaluation of current treatment plan related to hypertension self management and patient's adherence to plan as established by provider; Reviewed medications with patient and discussed importance of compliance; Discussed plans with patient for ongoing care management follow up and provided patient with direct contact information for care management team; Reviewed scheduled/upcoming provider appointments including:  Advised patient, providing education and rationale to continue to monitor blood pressure and record, calling PCP for findings outside of  established parameters  Diabetes Interventions: Goal on track: Yes  Long term goal  Assessed patient's understanding of A1c goal: <7% Reviewed medications with patient and discussed importance of medication adherence; Discussed plans with patient for ongoing care management follow up and provided patient with direct contact information for care management team; Reviewed scheduled/upcoming provider appointments ( Endocrinology follow up visit is 07/26/21) Advised patient to continue to monitor blood sugars daily and notify her doctor for findings outside established parameters Lab Results   Component Value Date   HGBA1C 6.9 (A) 03/23/2021  AFIB Interventions: ( Status: Goal on track):  Long term goal  Discussed plans with patient for ongoing care management follow up and provided patient with direct contact information for care management team; Reviewed scheduled/upcoming provider appointments Advised to continue monitoring pulse rate Advised to report any new or ongoing symptoms to provider   Falls Interventions:  (Status:  New goal.) Long Term Goal Reviewed medications and discussed importance of compliance Advised patient of importance of notifying provider of falls Advised to continue to follow fall precautions  Patient Goals/Self-Care Activities: Continue to take medications as prescribed   Attend all scheduled provider appointments Call pharmacy for medication refills 3-7 days in advance of running out of medications Call provider office for new concerns or questions  Continue to check blood pressure 2- 3 times per week and record Continue to check pulse rate daily Continue to follow a low salt diet Continue to check blood sugars as recommended by your provider ( take blood sugar diary to your provider visit)        Plan:The patient has been provided with contact information for the care management team and has been advised to call with any health related questions or concerns.  The care management team will reach out to the patient again over the next 2-3 months . Quinn Plowman RN,BSN,CCM RN Case Manager [place]  [phone]

## 2021-07-23 NOTE — Patient Instructions (Signed)
Visit Information  Thank you for taking time to visit with me today. Please don't hesitate to contact me if I can be of assistance to you before our next scheduled telephone appointment.  Following are the goals we discussed today:  Continue to take medications as prescribed   Attend all scheduled provider appointments Call pharmacy for medication refills 3-7 days in advance of running out of medications Call provider office for new concerns or questions  Continue to check blood pressure 2- 3 times per week and record Continue to check pulse rate daily Continue to follow a low salt diet Continue to check blood sugars as recommended by your provider ( take blood sugar diary to your provider visit)  Our next appointment is by telephone on 10/08/21 at 10:00am  Please call the care guide team at [phone] if you need to cancel or reschedule your appointment.   If you are experiencing a Mental Health or [place] or need someone to talk to, please call the Suicide and Crisis Lifeline: 988 call 1-800-273-TALK (toll free, 24 hour hotline)   Patient verbalizes understanding of instructions and care plan provided today and agrees to view in [place]. Active MyChart status and patient understanding of how to access instructions and care plan via MyChart confirmed with patient.     Quinn Plowman RN,BSN,CCM RN Case Manager [place]  [phone]

## 2021-07-26 ENCOUNTER — Encounter: Payer: Self-pay | Admitting: Internal Medicine

## 2021-07-26 ENCOUNTER — Ambulatory Visit: Payer: Medicare HMO | Admitting: Internal Medicine

## 2021-07-26 VITALS — BP 130/76 | HR 69 | Ht 65.0 in | Wt 168.6 lb

## 2021-07-26 DIAGNOSIS — E7800 Pure hypercholesterolemia, unspecified: Secondary | ICD-10-CM

## 2021-07-26 DIAGNOSIS — E119 Type 2 diabetes mellitus without complications: Secondary | ICD-10-CM | POA: Diagnosis not present

## 2021-07-26 DIAGNOSIS — E063 Autoimmune thyroiditis: Secondary | ICD-10-CM

## 2021-07-26 LAB — POCT GLYCOSYLATED HEMOGLOBIN (HGB A1C): Hemoglobin A1C: 6.4 % — AB (ref 4.0–5.6)

## 2021-07-26 NOTE — Patient Instructions (Signed)
Please continue: - Metformin ER 1000 mg 2x a day - Tradjenta  5 mg before b'fast  Please return in 4-6 months with your sugar log.
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
## 2021-07-27 ENCOUNTER — Ambulatory Visit (INDEPENDENT_AMBULATORY_CARE_PROVIDER_SITE_OTHER): Payer: Medicare HMO

## 2021-07-27 DIAGNOSIS — R001 Bradycardia, unspecified: Secondary | ICD-10-CM

## 2021-07-30 LAB — CUP PACEART REMOTE DEVICE CHECK
Date Time Interrogation Session: 20230526074157
Implantable Pulse Generator Implant Date: 20210816

## 2021-07-31 DIAGNOSIS — N3941 Urge incontinence: Secondary | ICD-10-CM | POA: Diagnosis not present

## 2021-08-01 DIAGNOSIS — I4891 Unspecified atrial fibrillation: Secondary | ICD-10-CM

## 2021-08-01 DIAGNOSIS — E1159 Type 2 diabetes mellitus with other circulatory complications: Secondary | ICD-10-CM | POA: Diagnosis not present

## 2021-08-01 DIAGNOSIS — Z7984 Long term (current) use of oral hypoglycemic drugs: Secondary | ICD-10-CM | POA: Diagnosis not present

## 2021-08-01 DIAGNOSIS — I10 Essential (primary) hypertension: Secondary | ICD-10-CM | POA: Diagnosis not present

## 2021-08-06 NOTE — Progress Notes (Signed)
Carelink Summary Report / Loop Recorder 

## 2021-08-09 DIAGNOSIS — R350 Frequency of micturition: Secondary | ICD-10-CM | POA: Diagnosis not present

## 2021-08-10 ENCOUNTER — Ambulatory Visit
Admission: EM | Admit: 2021-08-10 | Discharge: 2021-08-10 | Disposition: A | Discharge status: Home / Self Care | Payer: Medicare HMO | Attending: Family Medicine | Admitting: Family Medicine

## 2021-08-10 ENCOUNTER — Telehealth: Payer: Self-pay | Admitting: Cardiovascular Disease

## 2021-08-10 DIAGNOSIS — B9789 Other viral agents as the cause of diseases classified elsewhere: Secondary | ICD-10-CM

## 2021-08-10 DIAGNOSIS — J019 Acute sinusitis, unspecified: Secondary | ICD-10-CM

## 2021-08-10 MED ORDER — LEVOCETIRIZINE DIHYDROCHLORIDE 5 MG PO TABS: 5.0000 mg | ORAL_TABLET | Freq: Every evening | ORAL | 0 refills | Status: DC

## 2021-08-10 MED ORDER — IPRATROPIUM BROMIDE 0.03 % NA SOLN: 2.0000 | Freq: Three times a day (TID) | NASAL | 0 refills | Status: DC | PRN

## 2021-08-10 NOTE — Telephone Encounter (Signed)
   Pre-operative Risk Assessment    Patient Name: [name]  DOB: 09-07-1945 MRN: [id number]      Request for Surgical Clearance    Procedure:   Interstim Placement  Date of Surgery:  Clearance 09/11/21                                 Surgeon:  Dr. Matilde Sprang Surgeon's Group or Practice Name:  [facility] Phone number:  [phone] Fax number:  [phone]   Type of Clearance Requested:   - Pharmacy:  Hold Apixaban (Eliquis)     Type of Anesthesia:  MAC   Additional requests/questions:  Please advise surgeon/provider what medications should be held.  Signed, Belisicia T Harris   08/10/2021, 9:31 AM

## 2021-08-10 NOTE — ED Triage Notes (Signed)
Patient presents to Urgent Care with complaints of facial pain and dizziness x 1 day. Facial pressure and bilateral ear pressure. Concerned with sinus infection. Treating pain with tylenol.  Denies fever.

## 2021-08-10 NOTE — ED Provider Notes (Signed)
[name]    CSN: [id number] Arrival date & time: 08/10/21  1429      History   Chief Complaint Chief Complaint  Patient presents with   Facial Pain   Ear Fullness    HPI [name] is a 76 y.o. female.   HPI Patient presents for evaluation of facial pain and ear fullness x 2 days. She endorses facial tenderness at the maxillary sinus region. No cough and nose if not running. She mowed grass yesterday and last night symptoms worsened. No fever. No history of recurrent sinus infection.  Past Medical History:  Diagnosis Date   Anemia    Anxiety    Atrial fibrillation (HCC)    Colon polyps    Constipation    Depressive disorder, not elsewhere classified    Diabetes mellitus without complication ([place])    Diaphragmatic hernia without mention of obstruction or gangrene    Disorders of bursae and tendons in shoulder region, unspecified    Diverticulosis of colon (without mention of hemorrhage)    External hemorrhoid    Fundic gland polyps of stomach, benign    GERD (gastroesophageal reflux disease)    History of hiatal hernia    HTN (hypertension)    Hypertension    Hypothyroidism    Internal hemorrhoids without mention of complication    Moderate mitral regurgitation    Obesity, unspecified    OSA on CPAP    compliant with CPAP   Other urinary incontinence    Persistent atrial fibrillation ([place])    Pure hypercholesterolemia    Type II diabetes mellitus ([place])    Urinary frequency    Urinary urgency    Vitamin B 12 deficiency     Patient Active Problem List   Diagnosis Date Noted   Dizziness 02/20/2021   Intractable acute post-traumatic headache 02/20/2021   Secondary hypercoagulable state ([place]) 11/27/2020   Cellulitis of left lower extremity 10/20/2020   Closed fracture of base of fifth metatarsal bone, left, initial encounter 07/26/2020   Ear pain, left 07/21/2019   Atrial tachycardia ([place]) 09/15/2018   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   Status post  total hip replacement, 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   Lumbar radiculopathy 01/05/2018   Vitamin B 12 deficiency    Urge incontinence    OSA on CPAP    History of hiatal hernia    GERD (gastroesophageal reflux disease)    Anxiety    Other constipation 01/02/2017   Class 1 obesity with serious comorbidity and body mass index (BMI) of 30.0 to 30.9 in adult 01/02/2017   Overweight (BMI 25.0-29.9) 12/19/2016   Persistent atrial fibrillation ([place]) 12/17/2016   Vitamin D deficiency 11/20/2016   Family history of ovarian cancer 05/24/2016   Iron deficiency anemia    Hypertension associated with diabetes ([place])    Preoperative evaluation to rule out surgical contraindication 05/16/2015   Controlled diabetes mellitus type II without complication ([place]) 22/04/5425   Memory loss 05/13/2014   Mitral regurgitation 10/11/2013   Incidental lung nodule 07/16/2011   SCIATICA, RIGHT 04/10/2010   Hyperlipidemia associated with type 2 diabetes mellitus ([place]) 06/11/2006   MDD (major depressive disorder), recurrent episode, 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 [name]"   CARDIOVERSION N/A 05/27/2016   Procedure: CARDIOVERSION;  Surgeon: [name], MD;  Location: [facility];  Service: Cardiovascular;  Laterality: N/A;   CARDIOVERSION N/A 11/25/2016   Procedure: CARDIOVERSION;  Surgeon: [name], MD;  Location: [facility];  Service: Cardiovascular;  Laterality: N/A;   CARDIOVERSION N/A 09/18/2018   Procedure:  CARDIOVERSION;  Surgeon: [name], MD;  Location: [facility];  Service: Cardiovascular;  Laterality: N/A;   CARDIOVERSION N/A 07/28/2019   Procedure: CARDIOVERSION;  Surgeon: Kate Sable, MD;  Location: [facility];  Service: Cardiovascular;  Laterality: N/A;   CARDIOVERSION N/A 09/07/2019   Procedure: CARDIOVERSION;  Surgeon: Nelva Bush, MD;  Location: [facility];  Service: Cardiovascular;  Laterality: N/A;   CATARACT EXTRACTION W/ INTRAOCULAR LENS  IMPLANT, BILATERAL Bilateral    COLONOSCOPY     ELECTROPHYSIOLOGIC STUDY N/A 01/24/2016   Procedure: CARDIOVERSION;  Surgeon: [name], MD;  Location: [facility];  Service: Cardiovascular;  Laterality: N/A;   implantable loop recorder implantation  10/18/2019   [employer] Reveal [place] model [id number]) implantable loop recorder    LAPAROSCOPIC CHOLECYSTECTOMY     REDUCTION MAMMAPLASTY Bilateral    TONSILLECTOMY     TOTAL HIP ARTHROPLASTY Right 05/12/2018   Procedure: TOTAL HIP ARTHROPLASTY ANTERIOR APPROACH;  Surgeon: Paralee Cancel, MD;  Location: [facility];  Service: Orthopedics;  Laterality: Right;  70 mins    OB History     Gravida  2   Para  0   Term  0   Preterm  0   AB  0   Living         SAB  0   IAB  0   Ectopic  0   Multiple      Live Births               Home Medications    Prior to Admission medications   Medication Sig Start Date End Date Taking? Authorizing Provider  ipratropium (ATROVENT) 0.03 % nasal spray Place 2 sprays into both nostrils 3 (three) times daily as needed (Nasal Congestion and Runny nose). 08/10/21  Yes Scot Jun, FNP  levocetirizine (XYZAL) 5 MG tablet Take 1 tablet (5 mg total) by mouth every evening. 08/10/21  Yes Scot Jun, FNP  Ascorbic Acid (VITAMIN C PO) Take 1 tablet by mouth at bedtime.    [provider]  atorvastatin (LIPITOR) 10 MG tablet TAKE ONE TABLET BY MOUTH AT BEDTIME Patient taking differently: Take 10 mg by mouth at  bedtime. 03/19/21   Bedsole, Amy E, MD  calcium carbonate (OS-CAL - DOSED IN MG OF ELEMENTAL CALCIUM) 1250 (500 Ca) MG tablet Take 1 tablet by mouth daily with breakfast.    [provider]  dofetilide (TIKOSYN) 250 MCG capsule Take 1 capsule (250 mcg total) by mouth 2 (two) times daily. 05/18/21   Fenton, Clint R, PA  ELIQUIS 5 MG TABS tablet TAKE 1 TABLET BY MOUTH TWICE A DAY Patient taking differently: Take 5 mg by mouth 2 (two) times daily. 02/01/21   Allred, Jeneen Rinks, MD  hydroxypropyl methylcellulose / hypromellose (ISOPTO TEARS / GONIOVISC) 2.5 % ophthalmic solution Place 1 drop into both eyes 2 (two) times daily as needed for dry eyes.    [provider]  linagliptin (TRADJENTA)  5 MG TABS tablet Take 1 tablet (5 mg total) by mouth daily. 02/05/21   [name], MD  magnesium oxide (MAG-OX) 400 MG tablet Take 1 tablet (400 mg total) by mouth 2 (two) times daily. 05/11/21   Shirley Friar, PA-C  metFORMIN (GLUCOPHAGE-XR) 500 MG 24 hr tablet TAKE TWO TABLETS BY MOUTH TWICE DAILY WITH MEALS 06/01/21   [name], MD  metoprolol succinate (TOPROL XL) 25 MG 24 hr tablet Take 0.5 tablets (12.5 mg total) by mouth at bedtime. 06/05/21   Fenton, Clint R, PA  MYRBETRIQ 50 MG TB24 tablet Take 50 mg by mouth daily. 11/27/20   [provider]  ONETOUCH ULTRA test strip CHECK BLOOD SUGAR 2 TIMES A DAY AS DIRECTED DX CODE E11.9 08/24/20   [name], MD    Family History Family History  Problem Relation Age of Onset   Cancer Mother        fallopian tube   Dementia Mother    Pulmonary embolism Mother    Hyperlipidemia Mother    Diabetes Mother    Hyperlipidemia Father    Lung cancer Father    Hypertension Sister    Diabetes Brother    Cancer Brother    Stroke Paternal Grandmother    Stroke Paternal Grandfather    Kidney cancer Son    Dementia Maternal Aunt    Colon cancer Neg Hx    Esophageal cancer Neg Hx     Social History Social History    Tobacco Use   Smoking status: Never   Smokeless tobacco: Never  Vaping Use   Vaping Use: Never used  Substance Use Topics   Alcohol use: No    Alcohol/week: 0.0 standard drinks of alcohol   Drug use: No     Allergies   Cortisone, Pravachol [pravastatin sodium], and Contrast media [iodinated contrast media]   Review of Systems Review of Systems Pertinent negatives listed in HPI  Physical Exam Triage Vital Signs ED Triage Vitals  Enc Vitals Group     BP 08/10/21 1510 136/80     Pulse Rate 08/10/21 1510 73     Resp 08/10/21 1510 18     Temp 08/10/21 1510 97.8 F (36.6 C)     Temp Source 08/10/21 1510 Temporal     SpO2 08/10/21 1510 98 %     Weight --      Height --      Head Circumference --      Peak Flow --      Pain Score 08/10/21 1511 4     Pain Loc --      Pain Edu? --      Excl. in [place]? --    No data found.  Updated Vital Signs BP 136/80 (BP Location: Left Arm)   Pulse 73   Temp 97.8 F (36.6 C) (Temporal)   Resp 18   LMP  (LMP Unknown)   SpO2 98%   Visual Acuity Right Eye Distance:   Left Eye Distance:   Bilateral Distance:    Right Eye Near:   Left Eye Near:    Bilateral Near:     Physical Exam Constitutional:      Appearance: Normal appearance.  HENT:     Head: Normocephalic and atraumatic.     Right Ear: Tympanic membrane, ear canal and external ear normal.     Left Ear: Tympanic membrane, ear canal and external ear normal.     Nose: Congestion present.  Eyes:  Extraocular Movements: Extraocular movements intact.     Pupils: Pupils are equal, round, and reactive to light.  Cardiovascular:     Rate and Rhythm: Normal rate and regular rhythm.  Pulmonary:     Effort: Pulmonary effort is normal.     Breath sounds: Normal breath sounds.  Musculoskeletal:     Cervical back: Normal range of motion.  Lymphadenopathy:     Cervical: No cervical adenopathy.  Skin:    General: Skin is warm and dry.     Capillary Refill: Capillary  refill takes less than 2 seconds.  Neurological:     General: No focal deficit present.     Mental Status: She is alert.  Psychiatric:        Mood and Affect: Mood normal.        Behavior: Behavior normal.      UC Treatments / Results  Labs (all labs ordered are listed, but only abnormal results are displayed) Labs Reviewed - No data to display  EKG   Radiology No results found.  Procedures Procedures (including critical care time)  Medications Ordered in UC Medications - No data to display  Initial Impression / Assessment and Plan / UC Course  I have reviewed the triage vital signs and the nursing notes.  Pertinent labs & imaging results that were available during my care of the patient were reviewed by me and considered in my medical decision making (see chart for details).    Acute viral sinusitis  Symptomatic treatment warranted only. Treatment with Atrovent nasal spray PRN and Levocetirizine. Hydrate well with fluids. RTC as needed Final Clinical Impressions(s) / UC Diagnoses   Final diagnoses:  Acute viral sinusitis   Discharge Instructions   None    ED Prescriptions     Medication Sig Dispense Auth. Provider   ipratropium (ATROVENT) 0.03 % nasal spray Place 2 sprays into both nostrils 3 (three) times daily as needed (Nasal Congestion and Runny nose). 30 mL Scot Jun, FNP   levocetirizine (XYZAL) 5 MG tablet Take 1 tablet (5 mg total) by mouth every evening. 30 tablet Scot Jun, FNP      PDMP not reviewed this encounter.   Scot Jun, FNP 08/10/21 1544

## 2021-08-12 NOTE — Telephone Encounter (Signed)
Patient with diagnosis of atrial fibrillation on Eliquis for anticoagulation.    Procedure: interstim placement Date of procedure: 09/11/21   CHA2DS2-VASc Score = 4   This indicates a 4.8% annual risk of stroke. The patient's score is based upon: CHF History: 0 HTN History: 1 Diabetes History: 1 Stroke History: 0 Vascular Disease History: 0 Age Score: 1 Gender Score: 1   CrCl 46 Platelet count 349  Per office protocol, patient can hold Eliquis for 2 days prior to procedure.   Patient will not need bridging with Lovenox (enoxaparin) around procedure.

## 2021-08-14 NOTE — Telephone Encounter (Signed)
LMOM for patient to return call.

## 2021-08-14 NOTE — Telephone Encounter (Signed)
Primary Cardiologist:Muhammad Fletcher Anon, MD  Chart reviewed as part of pre-operative protocol coverage. Because of [name]'s past medical history and time since last visit, he/she will require a virtual visit/telephone call in order to better assess preoperative cardiovascular risk.  Pre-op covering staff: - Please contact patient, obtain consent, and schedule appointment   Emmaline Life, NP-C    08/14/2021, [facility] [address] Office [phone] Fax [phone]

## 2021-08-17 ENCOUNTER — Telehealth: Payer: Self-pay | Admitting: *Deleted

## 2021-08-17 NOTE — Telephone Encounter (Signed)
I s/w the pt about setting up a tele pre op appt. Pt tells me she was recently cleared by Dr. Caryl Comes for another procedure. Pt though is agreeable to plan of care. Med rec and consent are done.

## 2021-08-17 NOTE — Telephone Encounter (Signed)
I s/w the pt about setting up a tele pre op appt. Pt tells me she was recently cleared by Dr. Caryl Comes for another procedure. Pt though is agreeable to plan of care. Med rec and consent are done.     Patient Consent for Virtual Visit        [name] has provided verbal consent on 08/17/2021 for a virtual visit (video or telephone).   CONSENT FOR VIRTUAL VISIT FOR:  [name]  By participating in this virtual visit I agree to the following:  I hereby voluntarily request, consent and authorize [facility] and its employed or contracted physicians, Engineer, materials, nurse practitioners or other licensed health care professionals (the Practitioner), to provide me with telemedicine health care services (the "Services") as deemed necessary by the treating Practitioner. I acknowledge and consent to receive the Services by the Practitioner via telemedicine. I understand that the telemedicine visit will involve communicating with the Practitioner through live audiovisual communication technology and the disclosure of certain medical information by electronic transmission. I acknowledge that I have been given the opportunity to request an in-person assessment or other available alternative prior to the telemedicine visit and am voluntarily participating in the telemedicine visit.  I understand that I have the right to withhold or withdraw my consent to the use of telemedicine in the course of my care at any time, without affecting my right to future care or treatment, and that the Practitioner or I may terminate the telemedicine visit at any time. I understand that I have the right to inspect all information obtained and/or recorded in the course of the telemedicine visit and may receive copies of available information for a reasonable fee.  I understand that some of the potential risks of receiving the Services via telemedicine include:  Delay or interruption in medical evaluation due to  technological equipment failure or disruption; Information transmitted may not be sufficient (e.g. poor resolution of images) to allow for appropriate medical decision making by the Practitioner; and/or  In rare instances, security protocols could fail, causing a breach of personal health information.  Furthermore, I acknowledge that it is my responsibility to provide information about my medical history, conditions and care that is complete and accurate to the best of my ability. I acknowledge that Practitioner's advice, recommendations, and/or decision may be based on factors not within their control, such as incomplete or inaccurate data provided by me or distortions of diagnostic images or specimens that may result from electronic transmissions. I understand that the practice of medicine is not an exact science and that Practitioner makes no warranties or guarantees regarding treatment outcomes. I acknowledge that a copy of this consent can be made available to me via my patient portal ([facility]), or I can request a printed copy by calling the office of [facility].    I understand that my insurance will be billed for this visit.   I have read or had this consent read to me. I understand the contents of this consent, which adequately explains the benefits and risks of the Services being provided via telemedicine.  I have been provided ample opportunity to ask questions regarding this consent and the Services and have had my questions answered to my satisfaction. I give my informed consent for the services to be provided through the use of telemedicine in my medical care

## 2021-08-20 ENCOUNTER — Ambulatory Visit (INDEPENDENT_AMBULATORY_CARE_PROVIDER_SITE_OTHER): Payer: Medicare HMO | Admitting: General Practice

## 2021-08-20 DIAGNOSIS — Z01810 Encounter for preprocedural cardiovascular examination: Secondary | ICD-10-CM

## 2021-08-20 NOTE — Progress Notes (Signed)
Virtual Visit via Telephone Note   Because of [name]'s co-morbid illnesses, she is at least at moderate risk for complications without adequate follow up.  This format is felt to be most appropriate for this patient at this time.  The patient did not have access to video technology/had technical difficulties with video requiring transitioning to audio format only (telephone).  All issues noted in this document were discussed and addressed.  No physical exam could be performed with this format.  Please refer to the patient's chart for her consent to telehealth for [facility].  Evaluation Performed:  Preoperative cardiovascular risk assessment _____________   Date:  08/20/2021   Patient ID:  [name], DOB 09-26-1945, MRN [id number] Patient Location:  Home Provider location:   Office  Primary Care Provider:  Jinny Sanders, MD Primary Cardiologist:  Kathlyn Sacramento, MD  Chief Complaint / Patient Profile   76 y.o. y/o female with a h/o palpitations, atrial fibrillation who is pending InterStim placement and presents today for telephonic preoperative cardiovascular risk assessment.  Past Medical History    Past Medical History:  Diagnosis Date   Anemia    Anxiety    Atrial fibrillation (HCC)    Colon polyps    Constipation    Depressive disorder, not elsewhere classified    Diabetes mellitus without complication ([place])    Diaphragmatic hernia without mention of obstruction or gangrene    Disorders of bursae and tendons in shoulder region, unspecified    Diverticulosis of colon (without mention of hemorrhage)    External hemorrhoid    Fundic gland polyps of stomach, benign    GERD (gastroesophageal reflux disease)    History of hiatal hernia    HTN (hypertension)    Hypertension    Hypothyroidism    Internal hemorrhoids without mention of complication    Moderate mitral regurgitation    Obesity, unspecified    OSA on CPAP    compliant with CPAP   Other  urinary incontinence    Persistent atrial fibrillation ([place])    Pure hypercholesterolemia    Type II diabetes mellitus ([place])    Urinary frequency    Urinary urgency    Vitamin B 12 deficiency    Past Surgical History:  Procedure Laterality Date   ABDOMINAL HYSTERECTOMY     ABDOMINAL HYSTERECTOMY  1972   Have both ovaries   ATRIAL FIBRILLATION ABLATION  12/17/2016   ATRIAL FIBRILLATION ABLATION N/A 12/17/2016   Procedure: ATRIAL FIBRILLATION ABLATION;  Surgeon: Thompson Grayer, MD;  Location: [place] [facility];  Service: Cardiovascular;  Laterality: N/A;   [place]   [place]; "Dr. [name]"   CARDIOVERSION N/A 05/27/2016   Procedure: CARDIOVERSION;  Surgeon: [name], MD;  Location: [facility] ORS;  Service: Cardiovascular;  Laterality: N/A;   CARDIOVERSION N/A 11/25/2016   Procedure: CARDIOVERSION;  Surgeon: [name], MD;  Location: [facility] OR[facility];  Service: Cardiovascular;  Laterality: N/A;   CARDIOVERSION N/A 09/18/2018   Procedure: CARDIOVERSION;  Surgeon: [name], MD;  Location: [place] [facility];  Service: Cardiovascular;  Laterality: N/A;   CARDIOVERSION N/A 07/28/2019   Procedure: CARDIOVERSION;  Surgeon: Kate Sable, MD;  Location: [facility] ORS;  Service: Cardiovascular;  Laterality: N/A;   CARDIOVERSION N/A 09/07/2019   Procedure: CARDIOVERSION;  Surgeon: Nelva Bush, MD;  Location: [facility] ORS;  Service: Cardiovascular;  Laterality: N/A;   CATARACT EXTRACTION W/ INTRAOCULAR LENS  IMPLANT, BILATERAL Bilateral    COLONOSCOPY     ELECTROPHYSIOLOGIC  STUDY N/A 01/24/2016   Procedure: CARDIOVERSION;  Surgeon: [name], MD;  Location: [facility] ORS;  Service: Cardiovascular;  Laterality: N/A;   implantable loop recorder implantation  10/18/2019   Medtronic Reveal [place] model [phone]) implantable loop recorder    LAPAROSCOPIC CHOLECYSTECTOMY     REDUCTION MAMMAPLASTY Bilateral    TONSILLECTOMY     TOTAL HIP ARTHROPLASTY Right 05/12/2018    Procedure: TOTAL HIP ARTHROPLASTY ANTERIOR APPROACH;  Surgeon: Paralee Cancel, MD;  Location: [facility];  Service: Orthopedics;  Laterality: Right;  70 mins    Allergies  Allergies  Allergen Reactions   Cortisone Shortness Of Breath and Rash   Pravachol [Pravastatin Sodium] Other (See Comments)    Leg pain    Contrast Media [Iodinated Contrast Media] Rash    History of Present Illness    [name] is a 76 y.o. female who presents via Engineer, civil (consulting) for a telehealth visit today.  Pt was last seen in cardiology clinic on 06/19/2021 by Dr. Caryl Comes.  At that time [name] was doing well .  The patient is now pending procedure as outlined above. Since her last visit, she is stable from a cardiac standpoint.  Today she denies chest pain, shortness of breath, lower extremity edema, fatigue, palpitations, melena, hematuria, hemoptysis, diaphoresis, weakness, presyncope, syncope, orthopnea, and PND.   Home Medications    Prior to Admission medications   Medication Sig Start Date End Date Taking? Authorizing Provider  Ascorbic Acid (VITAMIN C PO) Take 1 tablet by mouth at bedtime.    [provider]  atorvastatin (LIPITOR) 10 MG tablet TAKE ONE TABLET BY MOUTH AT BEDTIME Patient taking differently: Take 10 mg by mouth at bedtime. 03/19/21   Bedsole, Amy E, MD  calcium carbonate (OS-CAL - DOSED IN MG OF ELEMENTAL CALCIUM) 1250 (500 Ca) MG tablet Take 1 tablet by mouth daily with breakfast.    [provider]  dofetilide (TIKOSYN) 250 MCG capsule Take 1 capsule (250 mcg total) by mouth 2 (two) times daily. 05/18/21   Fenton, Clint R, PA  ELIQUIS 5 MG TABS tablet TAKE 1 TABLET BY MOUTH TWICE A DAY Patient taking differently: Take 5 mg by mouth 2 (two) times daily. 02/01/21   Allred, Jeneen Rinks, MD  hydroxypropyl methylcellulose / hypromellose (ISOPTO TEARS / GONIOVISC) 2.5 % ophthalmic solution Place 1 drop into both eyes 2 (two) times daily as needed for dry eyes.     [provider]  ipratropium (ATROVENT) 0.03 % nasal spray Place 2 sprays into both nostrils 3 (three) times daily as needed (Nasal Congestion and Runny nose). 08/10/21   Scot Jun, FNP  levocetirizine (XYZAL) 5 MG tablet Take 1 tablet (5 mg total) by mouth every evening. 08/10/21   Scot Jun, FNP  linagliptin (TRADJENTA) 5 MG TABS tablet Take 1 tablet (5 mg total) by mouth daily. 02/05/21   [name], MD  magnesium oxide (MAG-OX) 400 MG tablet Take 1 tablet (400 mg total) by mouth 2 (two) times daily. 05/11/21   Shirley Friar, PA-C  metFORMIN (GLUCOPHAGE-XR) 500 MG 24 hr tablet TAKE TWO TABLETS BY MOUTH TWICE DAILY WITH MEALS 06/01/21   [name], MD  metoprolol succinate (TOPROL XL) 25 MG 24 hr tablet Take 0.5 tablets (12.5 mg total) by mouth at bedtime. 06/05/21   Fenton, Clint R, PA  MYRBETRIQ 50 MG TB24 tablet Take 50 mg by mouth daily. 11/27/20   [provider]  ONETOUCH ULTRA test strip CHECK BLOOD  SUGAR 2 TIMES A DAY AS DIRECTED DX CODE E11.9 08/24/20   [name], MD    Physical Exam    Vital Signs:  [name] does not have vital signs available for review today.  Given telephonic nature of communication, physical exam is limited. AAOx3. NAD. Normal affect.  Speech and respirations are unlabored.  Accessory Clinical Findings    None  Assessment & Plan    1.  Preoperative Cardiovascular Risk Assessment: InterStim placement 09/11/2021, [facility], Dr. Matilde Sprang     Primary Cardiologist: Kathlyn Sacramento, MD  Chart reviewed as part of pre-operative protocol coverage. Given past medical history and time since last visit, based on ACC/AHA guidelines, [name] would be at acceptable risk for the planned procedure without further cardiovascular testing.   Patient was advised that if she develops new symptoms prior to surgery to contact our office to arrange a follow-up appointment.  He verbalized  understanding.   Patient with diagnosis of atrial fibrillation on Eliquis for anticoagulation.     Procedure: interstim placement Date of procedure: 09/11/21     CHA2DS2-VASc Score = 4   This indicates a 4.8% annual risk of stroke. The patient's score is based upon: CHF History: 0 HTN History: 1 Diabetes History: 1 Stroke History: 0 Vascular Disease History: 0 Age Score: 1 Gender Score: 1   CrCl 46 Platelet count 349   Per office protocol, patient can hold Eliquis for 2 days prior to procedure.   Patient will not need bridging with Lovenox (enoxaparin) around procedure.        A copy of this note will be routed to requesting surgeon.  Time:   Today, I have spent 5 minutes with the patient with telehealth technology discussing medical history, symptoms, and management plan.  Prior to her phone evaluation I spent greater than 10 minutes reviewing her past medical history and medications.   Deberah Pelton, NP  08/20/2021, 8:48 AM

## 2021-08-20 NOTE — Progress Notes (Signed)
CLEARANCE NOTES HAVE BEEN FAXED TO REQUESTING OFFICE  

## 2021-08-21 ENCOUNTER — Other Ambulatory Visit: Payer: Self-pay | Admitting: Family Medicine

## 2021-08-21 DIAGNOSIS — Z1231 Encounter for screening mammogram for malignant neoplasm of breast: Secondary | ICD-10-CM

## 2021-08-29 ENCOUNTER — Ambulatory Visit (INDEPENDENT_AMBULATORY_CARE_PROVIDER_SITE_OTHER): Payer: Medicare HMO

## 2021-08-29 ENCOUNTER — Telehealth (HOSPITAL_COMMUNITY): Payer: Self-pay

## 2021-08-29 DIAGNOSIS — I4819 Other persistent atrial fibrillation: Secondary | ICD-10-CM | POA: Diagnosis not present

## 2021-08-29 NOTE — Telephone Encounter (Signed)
Patient called regarding her A-fib. She feels like she may be going in and out of A-fib. Heart rate has been ranging from 45-90 however, mostly staying in the 80s-90s. Scheduled appointment for patient on 6/29 @ 1:30pm with [facility].  Patient verbalized understanding

## 2021-08-30 ENCOUNTER — Ambulatory Visit (HOSPITAL_COMMUNITY)
Admission: RE | Admit: 2021-08-30 | Discharge: 2021-08-30 | Disposition: A | Discharge status: Home / Self Care | Payer: Medicare HMO | Source: Ambulatory Visit | Attending: Physician Assistant | Admitting: Physician Assistant

## 2021-08-30 ENCOUNTER — Ambulatory Visit: Payer: Medicare HMO | Admitting: Internal Medicine

## 2021-08-30 ENCOUNTER — Ambulatory Visit: Payer: Medicare HMO

## 2021-08-30 ENCOUNTER — Encounter: Payer: Self-pay | Admitting: Internal Medicine

## 2021-08-30 ENCOUNTER — Encounter (HOSPITAL_COMMUNITY): Payer: Self-pay | Admitting: Physician Assistant

## 2021-08-30 VITALS — BP 152/78 | HR 72 | Ht 65.0 in | Wt 175.8 lb

## 2021-08-30 VITALS — BP 126/66 | HR 73 | Ht 65.0 in | Wt 174.8 lb

## 2021-08-30 DIAGNOSIS — E119 Type 2 diabetes mellitus without complications: Secondary | ICD-10-CM | POA: Insufficient documentation

## 2021-08-30 DIAGNOSIS — I10 Essential (primary) hypertension: Secondary | ICD-10-CM | POA: Diagnosis not present

## 2021-08-30 DIAGNOSIS — Z7901 Long term (current) use of anticoagulants: Secondary | ICD-10-CM | POA: Insufficient documentation

## 2021-08-30 DIAGNOSIS — Z9989 Dependence on other enabling machines and devices: Secondary | ICD-10-CM | POA: Insufficient documentation

## 2021-08-30 DIAGNOSIS — M5412 Radiculopathy, cervical region: Secondary | ICD-10-CM

## 2021-08-30 DIAGNOSIS — G4733 Obstructive sleep apnea (adult) (pediatric): Secondary | ICD-10-CM | POA: Insufficient documentation

## 2021-08-30 DIAGNOSIS — D6869 Other thrombophilia: Secondary | ICD-10-CM | POA: Diagnosis not present

## 2021-08-30 DIAGNOSIS — Z7984 Long term (current) use of oral hypoglycemic drugs: Secondary | ICD-10-CM | POA: Insufficient documentation

## 2021-08-30 DIAGNOSIS — I4819 Other persistent atrial fibrillation: Secondary | ICD-10-CM

## 2021-08-30 DIAGNOSIS — Z79899 Other long term (current) drug therapy: Secondary | ICD-10-CM | POA: Insufficient documentation

## 2021-08-30 LAB — CUP PACEART REMOTE DEVICE CHECK
Date Time Interrogation Session: 20230629092426
Implantable Pulse Generator Implant Date: 20210816

## 2021-08-30 MED ORDER — METOPROLOL SUCCINATE ER 50 MG PO TB24: ORAL_TABLET | ORAL | 3 refills | Status: DC

## 2021-08-30 NOTE — Progress Notes (Signed)
Primary Care Physician: Jinny Sanders, MD Primary Cardiologist: Dr Fletcher Anon Primary Electrophysiologist: Dr Caryl Comes Referring Physician: Dr Rayann Heman   [name] is a 76 y.o. female with a history of anemia, DM, HTN, hypothyroidism, OSA, MR, atrial fibrillation who presents for follow up in the [facility]. Patient is on Eliquis for a CHADS2VASC score of 4. She was seen at the ED 11/21/20 for afib with RVR and underwent DCCV at that time. Patient is s/p dofetilide admission 3/7-3/10/23.   On follow up today, patient reports that she has had more frequent episodes of tachypalpitations. She also has epigastric pain when her heart rate gets very fast. No syncopal episodes. ILR shows 7% afib burden since April. No bleeding issues on anticoagulation. There does not appears to be specific triggers for her afib.   Today, she denies symptoms of chest pain, shortness of breath, orthopnea, PND, lower extremity edema, presyncope, syncope, snoring, daytime somnolence, bleeding, or neurologic sequela. The patient is tolerating medications without difficulties and is otherwise without complaint today.    Atrial Fibrillation Risk Factors:  she does have symptoms or diagnosis of sleep apnea. she is compliant with CPAP therapy. she does not have a history of rheumatic fever.   she has a BMI of Body mass index is 29.25 kg/m.[name] Filed Weights   08/30/21 1318  Weight: 79.7 kg     Family History  Problem Relation Age of Onset   Cancer Mother        fallopian tube   Dementia Mother    Pulmonary embolism Mother    Hyperlipidemia Mother    Diabetes Mother    Hyperlipidemia Father    Lung cancer Father    Hypertension Sister    Diabetes Brother    Cancer Brother    Stroke Paternal Grandmother    Stroke Paternal Grandfather    Kidney cancer Son    Dementia Maternal Aunt    Colon cancer Neg Hx    Esophageal cancer Neg Hx      Atrial Fibrillation Management  history:  Previous antiarrhythmic drugs: flecainide, dofetilide  Previous cardioversions: 2018 x 2, 2020, 2021 x 2, 11/21/20, 03/24/21 Previous ablations: 12/17/16 CHADS2VASC score: 4 Anticoagulation history: Eliquis   Past Medical History:  Diagnosis Date   Anemia    Anxiety    Atrial fibrillation (HCC)    Colon polyps    Constipation    Depressive disorder, not elsewhere classified    Diabetes mellitus without complication ([place])    Diaphragmatic hernia without mention of obstruction or gangrene    Disorders of bursae and tendons in shoulder region, unspecified    Diverticulosis of colon (without mention of hemorrhage)    External hemorrhoid    Fundic gland polyps of stomach, benign    GERD (gastroesophageal reflux disease)    History of hiatal hernia    HTN (hypertension)    Hypertension    Hypothyroidism    Internal hemorrhoids without mention of complication    Moderate mitral regurgitation    Obesity, unspecified    OSA on CPAP    compliant with CPAP   Other urinary incontinence    Persistent atrial fibrillation ([place])    Pure hypercholesterolemia    Type II diabetes mellitus ([place])    Urinary frequency    Urinary urgency    Vitamin B 12 deficiency    Past Surgical History:  Procedure Laterality Date   ABDOMINAL HYSTERECTOMY     ABDOMINAL HYSTERECTOMY  1972  Have both ovaries   ATRIAL FIBRILLATION ABLATION  12/17/2016   ATRIAL FIBRILLATION ABLATION N/A 12/17/2016   Procedure: ATRIAL FIBRILLATION ABLATION;  Surgeon: Thompson Grayer, MD;  Location: [place] [facility];  Service: Cardiovascular;  Laterality: N/A;   [place]   [facility]; "Dr. [name]"   CARDIOVERSION N/A 05/27/2016   Procedure: CARDIOVERSION;  Surgeon: [name], MD;  Location: [facility] OR[facility];  Service: Cardiovascular;  Laterality: N/A;   CARDIOVERSION N/A 11/25/2016   Procedure: CARDIOVERSION;  Surgeon: [name], MD;  Location: [facility];  Service: Cardiovascular;  Laterality:  N/A;   CARDIOVERSION N/A 09/18/2018   Procedure: CARDIOVERSION;  Surgeon: [name], MD;  Location: [facility];  Service: Cardiovascular;  Laterality: N/A;   CARDIOVERSION N/A 07/28/2019   Procedure: CARDIOVERSION;  Surgeon: Kate Sable, MD;  Location: [facility];  Service: Cardiovascular;  Laterality: N/A;   CARDIOVERSION N/A 09/07/2019   Procedure: CARDIOVERSION;  Surgeon: Nelva Bush, MD;  Location: [facility];  Service: Cardiovascular;  Laterality: N/A;   CATARACT EXTRACTION W/ INTRAOCULAR LENS  IMPLANT, BILATERAL Bilateral    COLONOSCOPY     ELECTROPHYSIOLOGIC STUDY N/A 01/24/2016   Procedure: CARDIOVERSION;  Surgeon: [name], MD;  Location: [facility];  Service: Cardiovascular;  Laterality: N/A;   implantable loop recorder implantation  10/18/2019   Medtronic Reveal [place] model [phone] G) implantable loop recorder    LAPAROSCOPIC CHOLECYSTECTOMY     REDUCTION MAMMAPLASTY Bilateral    TONSILLECTOMY     TOTAL HIP ARTHROPLASTY Right 05/12/2018   Procedure: TOTAL HIP ARTHROPLASTY ANTERIOR APPROACH;  Surgeon: Paralee Cancel, MD;  Location: [facility];  Service: Orthopedics;  Laterality: Right;  70 mins    Current Outpatient Medications  Medication Sig Dispense Refill   Ascorbic Acid (VITAMIN C PO) Take 1 tablet by mouth at bedtime.     atorvastatin (LIPITOR) 10 MG tablet TAKE ONE TABLET BY MOUTH AT BEDTIME 90 tablet 1   calcium carbonate (OS-CAL - DOSED IN MG OF ELEMENTAL CALCIUM) 1250 (500 Ca) MG tablet Take 1 tablet by mouth daily with breakfast.     dofetilide (TIKOSYN) 250 MCG capsule Take 1 capsule (250 mcg total) by mouth 2 (two) times daily. 60 capsule 6   ELIQUIS 5 MG TABS tablet TAKE 1 TABLET BY MOUTH TWICE A DAY 60 tablet 9   hydroxypropyl methylcellulose / hypromellose (ISOPTO TEARS / GONIOVISC) 2.5 % ophthalmic solution Place 1 drop into both eyes 2 (two) times daily as needed for dry eyes.     ipratropium (ATROVENT) 0.03 % nasal spray Place 2 sprays into  both nostrils 3 (three) times daily as needed (Nasal Congestion and Runny nose). 30 mL 0   levocetirizine (XYZAL) 5 MG tablet Take 1 tablet (5 mg total) by mouth every evening. 30 tablet 0   linagliptin (TRADJENTA) 5 MG TABS tablet Take 1 tablet (5 mg total) by mouth daily. 90 tablet 3   magnesium oxide (MAG-OX) 400 MG tablet Take 1 tablet (400 mg total) by mouth 2 (two) times daily. 60 tablet 6   metFORMIN (GLUCOPHAGE-XR) 500 MG 24 hr tablet TAKE TWO TABLETS BY MOUTH TWICE DAILY WITH MEALS 360 tablet 0   metoprolol succinate (TOPROL XL) 25 MG 24 hr tablet Take 0.5 tablets (12.5 mg total) by mouth at bedtime. 30 tablet 3   MYRBETRIQ 50 MG TB24 tablet Take 50 mg by mouth daily.     ONETOUCH ULTRA test strip CHECK BLOOD SUGAR 2 TIMES A DAY AS DIRECTED DX CODE E11.9  100 strip 7   No current facility-administered medications for this encounter.    Allergies  Allergen Reactions   Cortisone Shortness Of Breath and Rash   Pravachol [Pravastatin Sodium] Other (See Comments)    Leg pain    Contrast Media [Iodinated Contrast Media] Rash    Social History   Socioeconomic History   Marital status: Married    Spouse name: Jimmy   Number of children: 2   Years of education: Not on file   Highest education level: Not on file  Occupational History   Not on file  Tobacco Use   Smoking status: Never   Smokeless tobacco: Never   Tobacco comments:    Never smoke 08/30/21  Vaping Use   Vaping Use: Never used  Substance and Sexual Activity   Alcohol use: No    Alcohol/week: 0.0 standard drinks of alcohol   Drug use: No   Sexual activity: Yes  Other Topics Concern   Not on file  Social History Narrative   ** Merged History Encounter **       Exercise walks 3 times daily Lives with spouse in [place] - retired from employment 1 son and 1 daughter  Diet fruit, occ fast food, salads and lean meat  3 tea/day, no EtOH, no tobacco, no drugs    Social Determinants of Health    Financial Resource Strain: Low Risk  (09/08/2020)   Overall Financial Resource Strain (CARDIA)    Difficulty of Paying Living Expenses: Not very hard  Food Insecurity: No Food Insecurity (09/08/2020)   Hunger Vital Sign    Worried About Running Out of Food in the Last Year: Never true    Effort in the Last Year: Never true  Transportation Needs: No Transportation Needs (09/08/2020)   PRAPARE - Hydrologist (Medical): No    Lack of Transportation (Non-Medical): No  Physical Activity: Insufficiently Active (08/29/2020)   Exercise Vital Sign    Days of Exercise per Week: 3 days    Minutes of Exercise per Session: 40 min  Stress: No Stress Concern Present (08/29/2020)   [place]    Feeling of Stress : Not at all  Social Connections: Unknown (09/18/2018)   Social Connection and Isolation Panel [NHANES]    Frequency of Communication with Friends and Family: More than three times a week    Frequency of Social Gatherings with Friends and Family: Not on file    Attends Religious Services: Not on file    Active Member of Clubs or Organizations: Not on file    Attends Archivist Meetings: Not on file    Marital Status: Not on file  Intimate Partner Violence: Not At Risk (08/29/2020)   Humiliation, Afraid, Rape, and Kick questionnaire    Fear of Current or Ex-Partner: No    Emotionally Abused: No    Physically Abused: No    Sexually Abused: No     ROS- All systems are reviewed and negative except as per the HPI above.  Physical Exam: Vitals:   08/30/21 1318  BP: (!) 152/78  Pulse: 72  Weight: 79.7 kg  Height: '5\' 5"'$  (1.651 m)     GEN- The patient is a well appearing elderly female, alert and oriented x 3 today.   HEENT-head normocephalic, atraumatic, sclera clear, conjunctiva pink, hearing intact, trachea midline. Lungs- Clear to ausculation bilaterally, normal work of  breathing Heart- Regular rate  and rhythm, no murmurs, rubs or gallops  GI- soft, NT, ND, + BS Extremities- no clubbing, cyanosis, or edema MS- no significant deformity or atrophy Skin- no rash or lesion Psych- euthymic mood, full affect Neuro- strength and sensation are intact   Wt Readings from Last 3 Encounters:  08/30/21 79.7 kg  07/26/21 76.5 kg  06/19/21 75.8 kg    EKG today demonstrates  SR Vent. rate 72 BPM PR interval 186 ms QRS duration 88 ms QT/QTcB 386/422 ms  Echo 04/24/20 demonstrated   1. Left ventricular ejection fraction, by estimation, is 55 to 60%. The  left ventricle has normal function. The left ventricle has no regional  wall motion abnormalities. The left ventricular internal cavity size was mildly dilated. Left ventricular diastolic parameters are indeterminate.   2. Right ventricular systolic function is normal. The right ventricular  size is normal. There is moderately elevated pulmonary artery systolic pressure. The estimated right ventricular systolic pressure is 07.6 mmHg.   3. Left atrial size was severely dilated.   4. The mitral valve is normal in structure. Moderate mitral valve  regurgitation. No evidence of mitral stenosis.   5. The aortic valve is normal in structure. Aortic valve regurgitation is not visualized. Mild aortic valve sclerosis is present, with no evidence of aortic valve stenosis.   6. The inferior vena cava is normal in size with greater than 50%  respiratory variability, suggesting right atrial pressure of 3 mmHg.   Comparison(s): Prior Echo showed LV EF 55-60%, severe LAE with eccentric MR.   Epic records are reviewed at length today  CHA2DS2-VASc Score = 4  The patient's score is based upon: CHF History: 0 HTN History: 1 Diabetes History: 1 Stroke History: 0 Vascular Disease History: 0 Age Score: 1 Gender Score: 1       ASSESSMENT AND PLAN: 1. Persistent Atrial Fibrillation (ICD10:  I48.19) The patient's  CHA2DS2-VASc score is 4, indicating a 4.8% annual risk of stroke.   S/p dofetilide admission 3/7-3/10/23 ILR shows 7% afib burden since April. She is having almost daily symptoms. Question if she is failing dofetilide. Would be hesitant to increase BB or change to amiodarone given concern about bradycardia in the past. Could consider PPM which would allow up titration of medication. Dr Caryl Comes had also mentioned referral to Dr Noralee Stain for ablation consideration. She did have multiple atrial flutter circuits noted on her last EP study. Will have patient see Dr Caryl Comes to discuss further.  Continue dofetilide 250 mcg BID. QT stable.  Continue Eliquis 5 mg BID Continue Toprol 12.5 mg daily  2. Secondary Hypercoagulable State (ICD10:  D68.69) The patient is at significant risk for stroke/thromboembolism based upon her CHA2DS2-VASc Score of 4.  Continue Apixaban (Eliquis).   3. OSA Encouraged compliance with CPAP therapy.  4. HTN Stable, no changes today.  5. MR Moderate MR   Follow up with Dr Caryl Comes.   [facility] [address] [place], [place] [address] [phone]

## 2021-08-30 NOTE — Progress Notes (Signed)
Patient Care Team: Jinny Sanders, MD as PCP - General (Family Medicine) [name], MD as PCP - Cardiology (Cardiology) Thompson Grayer, MD as PCP - Electrophysiology (Cardiology) [name], MD as Consulting Physician (Internal Medicine) [name], MD as Consulting Physician (Cardiology) Wilhelmina Mcardle, MD (Inactive) as Consulting Physician (Pulmonary Disease) Jinny Sanders, MD Dannielle Karvonen, RN as Case Manager   HPI  [name] is a 76 y.o. female in today having seen Dr. Lovena Le 1/23 and previously frequently by Dr. Rayann Heman.  PVI/CTI 10/18 she has a history of atrial fibrillation for which she has been treated with flecainide 100 twice daily with anticoagulation with Eliquis  Thereafter, she developed palpitations.  Was seen in the emergency room and underwent cardioversion. Because of falls was given an event recorder>> recurrent falls and ended up in the ER 2/23  She has had in the past falls associated with atrial fibrillation onset.  More recently she has had falls unassociated with atrial fibrillation.  She is also an atrial tachycardia  identified by RU-PA for which her loop recorder was reprogrammed; she has had multiple episodes of tachycardia with a ventricular rate about 130-140, regular suggestive of atrial flutters, perhaps post ablation.  On the day of her fall, her heart rate on arrival was about 110 without distinct P waves.  Her flecainide was determined to be inadequate and she was started on dofetilide   Significantly improved initially on dofetilide; however, recently has had increasing problems with palpitations particularly in the evening hours making sleep difficult; some lightheadedness.  Also over the last couple of weeks has had problems with bilateral arm weakness.  No radiation of pain into the arms from the wrist.     Antiarrhythmics Date Reason stopped  Flecainide / Ineffective  Dofetilide 3/23       DATE TEST EF    2/22 Echo   55-60 %              Date Cr K Hgb  3/23 1.10 4.7 11 (2/23)         Thromboembolic risk factors ( age -24 , DM-1, Gender-1) for a CHADSVASc Score of .=3     Records and Results Reviewed   Past Medical History:  Diagnosis Date   Anemia    Anxiety    Atrial fibrillation (HCC)    Colon polyps    Constipation    Depressive disorder, not elsewhere classified    Diabetes mellitus without complication ([place])    Diaphragmatic hernia without mention of obstruction or gangrene    Disorders of bursae and tendons in shoulder region, unspecified    Diverticulosis of colon (without mention of hemorrhage)    External hemorrhoid    Fundic gland polyps of stomach, benign    GERD (gastroesophageal reflux disease)    History of hiatal hernia    HTN (hypertension)    Hypertension    Hypothyroidism    Internal hemorrhoids without mention of complication    Moderate mitral regurgitation    Obesity, unspecified    OSA on CPAP    compliant with CPAP   Other urinary incontinence    Persistent atrial fibrillation ([place])    Pure hypercholesterolemia    Type II diabetes mellitus ([place])    Urinary frequency    Urinary urgency    Vitamin B 12 deficiency     Past Surgical History:  Procedure Laterality Date   ABDOMINAL HYSTERECTOMY  ABDOMINAL HYSTERECTOMY  1972   Have both ovaries   ATRIAL FIBRILLATION ABLATION  12/17/2016   ATRIAL FIBRILLATION ABLATION N/A 12/17/2016   Procedure: ATRIAL FIBRILLATION ABLATION;  Surgeon: Thompson Grayer, MD;  Location: [facility];  Service: Cardiovascular;  Laterality: N/A;   [place]   [facility]; "Dr. [name]"   CARDIOVERSION N/A 05/27/2016   Procedure: CARDIOVERSION;  Surgeon: [name], MD;  Location: [facility];  Service: Cardiovascular;  Laterality: N/A;   CARDIOVERSION N/A 11/25/2016   Procedure: CARDIOVERSION;  Surgeon: [name], MD;  Location: [facility];  Service: Cardiovascular;  Laterality: N/A;    CARDIOVERSION N/A 09/18/2018   Procedure: CARDIOVERSION;  Surgeon: [name], MD;  Location: [facility];  Service: Cardiovascular;  Laterality: N/A;   CARDIOVERSION N/A 07/28/2019   Procedure: CARDIOVERSION;  Surgeon: Kate Sable, MD;  Location: [facility];  Service: Cardiovascular;  Laterality: N/A;   CARDIOVERSION N/A 09/07/2019   Procedure: CARDIOVERSION;  Surgeon: Nelva Bush, MD;  Location: [facility];  Service: Cardiovascular;  Laterality: N/A;   CATARACT EXTRACTION W/ INTRAOCULAR LENS  IMPLANT, BILATERAL Bilateral    COLONOSCOPY     ELECTROPHYSIOLOGIC STUDY N/A 01/24/2016   Procedure: CARDIOVERSION;  Surgeon: [name], MD;  Location: [facility];  Service: Cardiovascular;  Laterality: N/A;   implantable loop recorder implantation  10/18/2019   [employer] Reveal [place] model ZOX09 [phone]) implantable loop recorder    LAPAROSCOPIC CHOLECYSTECTOMY     REDUCTION MAMMAPLASTY Bilateral    TONSILLECTOMY     TOTAL HIP ARTHROPLASTY Right 05/12/2018   Procedure: TOTAL HIP ARTHROPLASTY ANTERIOR APPROACH;  Surgeon: Paralee Cancel, MD;  Location: [facility];  Service: Orthopedics;  Laterality: Right;  70 mins    Current Meds  Medication Sig   Ascorbic Acid (VITAMIN C PO) Take 1 tablet by mouth at bedtime.   atorvastatin (LIPITOR) 10 MG tablet TAKE ONE TABLET BY MOUTH AT BEDTIME   calcium carbonate (OS-CAL - DOSED IN MG OF ELEMENTAL CALCIUM) 1250 (500 Ca) MG tablet Take 1 tablet by mouth daily with breakfast.   dofetilide (TIKOSYN) 250 MCG capsule Take 1 capsule (250 mcg total) by mouth 2 (two) times daily.   ELIQUIS 5 MG TABS tablet TAKE 1 TABLET BY MOUTH TWICE A DAY   hydroxypropyl methylcellulose / hypromellose (ISOPTO TEARS / GONIOVISC) 2.5 % ophthalmic solution Place 1 drop into both eyes 2 (two) times daily as needed for dry eyes.   linagliptin (TRADJENTA) 5 MG TABS tablet Take 1 tablet (5 mg total) by mouth daily.   magnesium oxide (MAG-OX) 400 MG tablet Take 1 tablet (400 mg  total) by mouth 2 (two) times daily.   metFORMIN (GLUCOPHAGE-XR) 500 MG 24 hr tablet TAKE TWO TABLETS BY MOUTH TWICE DAILY WITH MEALS   metoprolol succinate (TOPROL XL) 25 MG 24 hr tablet Take 0.5 tablets (12.5 mg total) by mouth at bedtime.   MYRBETRIQ 50 MG TB24 tablet Take 50 mg by mouth daily.   ONETOUCH ULTRA test strip CHECK BLOOD SUGAR 2 TIMES A DAY AS DIRECTED DX CODE E11.9    Allergies  Allergen Reactions   Cortisone Shortness Of Breath and Rash   Pravachol [Pravastatin Sodium] Other (See Comments)    Leg pain    Contrast Media [Iodinated Contrast Media] Rash      Review of Systems negative except from HPI and PMH  Physical Exam BP 126/66   Pulse 73   Ht '5\' 5"'$  (1.651 m)   Wt 174 lb 12.8 oz (  79.3 kg)   LMP  (LMP Unknown)   SpO2 98%   BMI 29.09 kg/m  Well developed and nourished in no acute distress HENT normal Neck supple with JVP-  flat   Clear Regular rate and rhythm, no murmurs or gallops Abd-soft with active BS No Clubbing cyanosis edema Skin-warm and dry A & Oriented unable to raise her arms above her shoulder; bilateral thenar weakness  ECG sinus rhythm at 73 Interval 17/09/40 Otherwise normal  This MRI cervical spine  CrCl cannot be calculated (Patient's most recent lab result is older than the maximum 21 days allowed.).   Assessment and  Plan  Atrial flutter-multiple variable ventricular responses  Falls/dizziness question mechanism  Dizziness  High Risk Medication Surveillance Dofetilide  Bilateral arm weakness  Significant increase in palpitations.  This following initial benefit with dofetilide.  Associated with rapid rates.  We will increase her metoprolol from 12.5--50 nightly.  Blood pressures in the 130s and heart rates in the 70s.  Has bilateral arm weakness which is new.  She will need MRI of her cervical spine.  And then follow-up with neurology.  She has thenar weakness which is probably carpal tunnel.  Given her inability to  lift her arms, I wonder whether she has bilateral rotator cuff tears although this should not explain her acute weakness.  The bilateral weakness should not be thromboembolic from her atrial arrhythmias.

## 2021-08-30 NOTE — Patient Instructions (Signed)
Medication Instructions:  Your physician has recommended you make the following change in your medication:   ** Stop Metoprolol Succinate '25mg'$   ** Begin Metoprolol Succinate '50mg'$  - 1 tablet by mouth at bedtime.     *If you need a refill on your cardiac medications before your next appointment, please call your pharmacy*   Lab Work: None ordered.  If you have labs (blood work) drawn today and your tests are completely normal, you will receive your results only by: [place] (if you have MyChart) OR A paper copy in the mail If you have any lab test that is abnormal or we need to change your treatment, we will call you to review the results.   Testing/Procedures: MRI of the cervical Spine   Follow-Up: At [facility], you and your health needs are our priority.  As part of our continuing mission to provide you with exceptional heart care, we have created designated Provider Care Teams.  These Care Teams include your primary Cardiologist (physician) and Advanced Practice Providers (APPs -  Physician Assistants and Nurse Practitioners) who all work together to provide you with the care you need, when you need it.  We recommend signing up for the patient portal called "MyChart".  Sign up information is provided on this After Visit Summary.  MyChart is used to connect with patients for Virtual Visits (Telemedicine).  Patients are able to view lab/test results, encounter notes, upcoming appointments, etc.  Non-urgent messages can be sent to your provider as well.   To learn more about what you can do with MyChart, go to [email or web address].    Your next appointment:   Follow up as discussed by Dr Caryl Comes  Important Information About Sugar

## 2021-08-31 ENCOUNTER — Ambulatory Visit (INDEPENDENT_AMBULATORY_CARE_PROVIDER_SITE_OTHER): Payer: Medicare HMO

## 2021-08-31 VITALS — Wt 174.0 lb

## 2021-08-31 DIAGNOSIS — Z0000 Encounter for general adult medical examination without abnormal findings: Secondary | ICD-10-CM | POA: Diagnosis not present

## 2021-08-31 NOTE — Progress Notes (Signed)
Subjective:   [name] is a 76 y.o. female who presents for Medicare Annual (Subsequent) preventive examination.  Virtual Visit via Telephone Note  I connected with  [name] on 08/31/21 at  2:00 PM EDT by telephone and verified that I am speaking with the correct person using two identifiers.  Location: Patient: Home Provider: [facility] Persons participating in the virtual visit: patient/Nurse Health Advisor   I discussed the limitations, risks, security and privacy concerns of performing an evaluation and management service by telephone and the availability of in person appointments. The patient expressed understanding and agreed to proceed.  Interactive audio and video telecommunications were attempted between this nurse and patient, however failed, due to patient having technical difficulties OR patient did not have access to video capability.  We continued and completed visit with audio only.  Some vital signs may be absent or patient reported.   Vinnie Gombert E Brandalynn Ofallon, LPN   Review of Systems     Cardiac Risk Factors include: advanced age (>21mn, >>28women);diabetes mellitus;dyslipidemia;hypertension;Other (see comment), Risk factor comments: A.Fib.     Objective:    Today's Vitals   08/31/21 1357  Weight: 174 lb (78.9 kg)  PainSc: 0-No pain   Body mass index is 28.96 kg/m.     08/31/2021    2:07 PM 05/09/2021   10:00 AM 04/12/2021   12:29 PM 03/24/2021    6:17 PM 03/13/2021    9:34 AM 09/08/2020   11:35 AM 08/29/2020   11:39 AM  Advanced Directives  Does Patient Have a Medical Advance Directive? Yes Yes Yes Yes Yes Yes Yes  Type of AParamedicof [place]amsvilleLiving will Living will;Healthcare Power of [place]outhmaydLiving will Living will HWaverlyLiving will HOlympia FieldsLiving will  Does patient want to make changes to medical advance directive?  No - Patient  declined No - Patient declined No - Patient declined  No - Patient declined   Copy of H[place]ry[place]in Chart? No - copy requested  No - copy requested No - copy requested, Physician notified   No - copy requested    Current Medications (verified) Outpatient Encounter Medications as of 08/31/2021  Medication Sig   Ascorbic Acid (VITAMIN C PO) Take 1 tablet by mouth at bedtime.   atorvastatin (LIPITOR) 10 MG tablet TAKE ONE TABLET BY MOUTH AT BEDTIME   calcium carbonate (OS-CAL - DOSED IN MG OF ELEMENTAL CALCIUM) 1250 (500 Ca) MG tablet Take 1 tablet by mouth daily with breakfast.   dofetilide (TIKOSYN) 250 MCG capsule Take 1 capsule (250 mcg total) by mouth 2 (two) times daily.   ELIQUIS 5 MG TABS tablet TAKE 1 TABLET BY MOUTH TWICE A DAY   hydroxypropyl methylcellulose / hypromellose (ISOPTO TEARS / GONIOVISC) 2.5 % ophthalmic solution Place 1 drop into both eyes 2 (two) times daily as needed for dry eyes.   linagliptin (TRADJENTA) 5 MG TABS tablet Take 1 tablet (5 mg total) by mouth daily.   magnesium oxide (MAG-OX) 400 MG tablet Take 1 tablet (400 mg total) by mouth 2 (two) times daily.   metFORMIN (GLUCOPHAGE-XR) 500 MG 24 hr tablet TAKE TWO TABLETS BY MOUTH TWICE DAILY WITH MEALS   metoprolol succinate (TOPROL-XL) 50 MG 24 hr tablet Take one tablet by mouth at bedtime   MYRBETRIQ 50 MG TB24 tablet Take 50 mg by mouth daily.   ONETOUCH ULTRA test strip CHECK BLOOD SUGAR  2 TIMES A DAY AS DIRECTED DX CODE E11.9   No facility-administered encounter medications on file as of 08/31/2021.    Allergies (verified) Cortisone, Pravachol [pravastatin sodium], and Contrast media [iodinated contrast media]   History: Past Medical History:  Diagnosis Date   Anemia    Anxiety    Atrial fibrillation (HCC)    Colon polyps    Constipation    Depressive disorder, not elsewhere classified    Diabetes mellitus without complication ([place])    Diaphragmatic hernia without mention of  obstruction or gangrene    Disorders of bursae and tendons in shoulder region, unspecified    Diverticulosis of colon (without mention of hemorrhage)    External hemorrhoid    Fundic gland polyps of stomach, benign    GERD (gastroesophageal reflux disease)    History of hiatal hernia    HTN (hypertension)    Hypertension    Hypothyroidism    Internal hemorrhoids without mention of complication    Moderate mitral regurgitation    Obesity, unspecified    OSA on CPAP    compliant with CPAP   Other urinary incontinence    Persistent atrial fibrillation ([place])    Pure hypercholesterolemia    Type II diabetes mellitus ([place])    Urinary frequency    Urinary urgency    Vitamin B 12 deficiency    Past Surgical History:  Procedure Laterality Date   ABDOMINAL HYSTERECTOMY     ABDOMINAL HYSTERECTOMY  1972   Have both ovaries   ATRIAL FIBRILLATION ABLATION  12/17/2016   ATRIAL FIBRILLATION ABLATION N/A 12/17/2016   Procedure: ATRIAL FIBRILLATION ABLATION;  Surgeon: Thompson Grayer, MD;  Location: [facility];  Service: Cardiovascular;  Laterality: N/A;   [place]; "Dr. [name]"   CARDIOVERSION N/A 05/27/2016   Procedure: CARDIOVERSION;  Surgeon: [name], MD;  Location: [facility];  Service: Cardiovascular;  Laterality: N/A;   CARDIOVERSION N/A 11/25/2016   Procedure: CARDIOVERSION;  Surgeon: [name], MD;  Location: [facility];  Service: Cardiovascular;  Laterality: N/A;   CARDIOVERSION N/A 09/18/2018   Procedure: CARDIOVERSION;  Surgeon: [name], MD;  Location: [facility];  Service: Cardiovascular;  Laterality: N/A;   CARDIOVERSION N/A 07/28/2019   Procedure: CARDIOVERSION;  Surgeon: Kate Sable, MD;  Location: [facility];  Service: Cardiovascular;  Laterality: N/A;   CARDIOVERSION N/A 09/07/2019   Procedure: CARDIOVERSION;  Surgeon: Nelva Bush, MD;  Location: [facility];  Service: Cardiovascular;  Laterality: N/A;   CATARACT  EXTRACTION W/ INTRAOCULAR LENS  IMPLANT, BILATERAL Bilateral    COLONOSCOPY     ELECTROPHYSIOLOGIC STUDY N/A 01/24/2016   Procedure: CARDIOVERSION;  Surgeon: [name], MD;  Location: [facility];  Service: Cardiovascular;  Laterality: N/A;   implantable loop recorder implantation  10/18/2019   Medtronic Reveal [place]all model [id number]) implantable loop recorder    LAPAROSCOPIC CHOLECYSTECTOMY     REDUCTION MAMMAPLASTY Bilateral    TONSILLECTOMY     TOTAL HIP ARTHROPLASTY Right 05/12/2018   Procedure: TOTAL HIP ARTHROPLASTY ANTERIOR APPROACH;  Surgeon: Paralee Cancel, MD;  Location: [facility];  Service: Orthopedics;  Laterality: Right;  70 mins   Family History  Problem Relation Age of Onset   Cancer Mother        fallopian tube   Dementia Mother    Pulmonary embolism Mother    Hyperlipidemia Mother    Diabetes Mother    Hyperlipidemia Father    Lung cancer Father  Hypertension Sister    Diabetes Brother    Cancer Brother    Stroke Paternal Grandmother    Stroke Paternal Grandfather    Kidney cancer Son    Dementia Maternal Aunt    Colon cancer Neg Hx    Esophageal cancer Neg Hx    Social History   Socioeconomic History   Marital status: Married    Spouse name: Jimmy   Number of children: 2   Years of education: Not on file   Highest education level: Not on file  Occupational History   Occupation: retired  Tobacco Use   Smoking status: Never   Smokeless tobacco: Never   Tobacco comments:    Never smoke 08/30/21  Vaping Use   Vaping Use: Never used  Substance and Sexual Activity   Alcohol use: No    Alcohol/week: 0.0 standard drinks of alcohol   Drug use: No   Sexual activity: Yes  Other Topics Concern   Not on file  Social History Narrative   ** Merged History Encounter **       Exercise walks 3 times daily Lives with spouse in [place] - retired from employment 1 son and 1 daughter  Diet fruit, occ fast food, salads and lean meat  3  tea/day, no EtOH, no tobacco, no drugs    Social Determinants of Health   Financial Resource Strain: Low Risk  (08/31/2021)   Overall Financial Resource Strain (CARDIA)    Difficulty of Paying Living Expenses: Not hard at all  Food Insecurity: No Food Insecurity (08/31/2021)   Hunger Vital Sign    Worried About Running Out of Food in the Last Year: Never true    [place] in the Last Year: Never true  Transportation Needs: No Transportation Needs (08/31/2021)   PRAPARE - Hydrologist (Medical): No    Lack of Transportation (Non-Medical): No  Physical Activity: Unknown (08/31/2021)   Exercise Vital Sign    Days of Exercise per Week: 3 days    Minutes of Exercise per Session: Not on file  Stress: No Stress Concern Present (08/31/2021)   [place]    Feeling of Stress : Only a little  Social Connections: Socially Integrated (08/31/2021)   Social Connection and Isolation Panel [NHANES]    Frequency of Communication with Friends and Family: More than three times a week    Frequency of Social Gatherings with Friends and Family: More than three times a week    Attends Religious Services: More than 4 times per year    Active Member of [employer] or Organizations: Yes    Attends Music therapist: More than 4 times per year    Marital Status: Married    Tobacco Counseling Counseling given: Not Answered Tobacco comments: Never smoke 08/30/21   Clinical Intake:  Pre-visit preparation completed: Yes  Pain : No/denies pain Pain Score: 0-No pain     BMI - recorded: 28.96 Nutritional Status: BMI 25 -29 Overweight Nutritional Risks: None Diabetes: Yes CBG done?: No Did pt. bring in CBG monitor from home?: No  How often do you need to have someone help you when you read instructions, pamphlets, or other written materials from your doctor or pharmacy?: 1 - Never  Diabetic?  Nutrition Risk Assessment:  Has the patient had any N/V/D within the last 2 months?  No  Does the patient have any non-healing wounds?  No  Has  the patient had any unintentional weight loss or weight gain?  No   Diabetes:  Is the patient diabetic?  Yes  If diabetic, was a CBG obtained today?  No  Did the patient bring in their glucometer from home?  No  How often do you monitor your CBG's? Once daily fasting - 120 this am per patient.   Financial Strains and Diabetes Management:  Are you having any financial strains with the device, your supplies or your medication? Yes .  Does the patient want to be seen by Chronic Care Management for management of their diabetes?  Yes  Would the patient like to be referred to a Nutritionist or for Diabetic Management?  No   Diabetic Exams:  Diabetic Eye Exam: Completed 10/19/2020.   Diabetic Foot Exam: Completed 10/20/2020. Pt has been advised about the importance in completing this exam.   Interpreter Needed?: No  Information entered by :: Shamar Engelmann, LPN   Activities of Daily Living    08/31/2021    2:03 PM 05/09/2021   10:00 AM  In your present state of health, do you have any difficulty performing the following activities:  Hearing? 0 0  Vision? 0 0  Difficulty concentrating or making decisions? 0 1  Comment  Some short term memory concerns  Walking or climbing stairs? 0 1  Dressing or bathing? 0 1  Doing errands, shopping? 0 0  Preparing Food and eating ? N   Using the Toilet? N   In the past six months, have you accidently leaked urine? Y   Comment scheduled with [facility] for procedure 7/11   Do you have problems with loss of bowel control? N   Managing your Medications? N   Managing your Finances? N   Housekeeping or managing your Housekeeping? N     Patient Care Team: Jinny Sanders, MD as PCP - General (Family Medicine) [name], MD as PCP - Cardiology (Cardiology) Thompson Grayer, MD as PCP -  Electrophysiology (Cardiology) [name], MD as Consulting Physician (Internal Medicine) [name], MD as Consulting Physician (Cardiology) Wilhelmina Mcardle, MD (Inactive) as Consulting Physician (Pulmonary Disease) Jinny Sanders, MD Dannielle Karvonen, RN as Case Manager  Indicate any recent Medical Services you may have received from other than [facility] providers in the past year (date may be approximate).     Assessment:   This is a routine wellness examination for Nakya.  Hearing/Vision screen Hearing Screening - Comments:: Denies hearing difficulties   Vision Screening - Comments:: Wears reading glasses prn - up to date with routine eye exams with [name] in [place] issues and exercise activities discussed: Current Exercise Habits: Home exercise routine, Type of exercise: walking, Time (Minutes): 20, Frequency (Times/Week): 3, Weekly Exercise (Minutes/Week): 60, Intensity: Mild, Exercise limited by: cardiac condition(s)   Goals Addressed             This Visit's Progress    Patient Stated   On track    08/31/2021 - I will continue to walk 3 days a week and try to increase to 30-45 minutes.  Try to stay out of A.Fib       Depression Screen    08/31/2021    2:02 PM 09/08/2020   11:43 AM 08/29/2020   11:40 AM 08/27/2019   11:24 AM 08/17/2018    9:56 AM 05/26/2017   10:24 AM 11/20/2016    8:07 AM  PHQ 2/9 Scores  PHQ - 2 Score  1 1 0 0 0 0 5  PHQ- 9 Score 1  0 0 0 0 19    Fall Risk    08/31/2021    1:59 PM 03/13/2021    9:35 AM 09/08/2020   11:39 AM 08/29/2020   11:39 AM 08/27/2019   11:22 AM  Fall Risk   Falls in the past year? 0 1 1 0 1  Number falls in past yr: 0 1 1 0 0  Injury with Fall? 0 1 1 0 0  Risk for fall due to : No Fall Risks   Medication side effect Medication side effect  Follow up Falls prevention discussed   Falls evaluation completed;Falls prevention discussed Falls evaluation completed;Falls prevention discussed    FALL  RISK PREVENTION PERTAINING TO THE HOME:  Any stairs in or around the home? Yes  If so, are there any without handrails? No  Home free of loose throw rugs in walkways, pet beds, electrical cords, etc? Yes  Adequate lighting in your home to reduce risk of falls? Yes   ASSISTIVE DEVICES UTILIZED TO PREVENT FALLS:  Life alert? No  Use of a cane, walker or w/c? No  Grab bars in the bathroom? No  Shower chair or bench in shower? No  Elevated toilet seat or a handicapped toilet? Yes   TIMED UP AND GO:  Was the test performed? No . Telephonic visit  Cognitive Function:    08/29/2020   11:41 AM 08/27/2019   11:26 AM 08/17/2018   11:01 AM 05/26/2017   10:29 AM 05/10/2016    1:40 PM  MMSE - Mini Mental State Exam  Orientation to time '5 5 5 5 5  '$ Orientation to Place '5 5 5 5 5  '$ Registration '3 3 3 3 3  '$ Attention/ Calculation 5 5 0 0 0  Recall '3 3 3 3 2  '$ Recall-comments     pt was unable to recall 1 of 3 words  Language- name 2 objects   0 0 0  Language- repeat '1 1 1 1 1  '$ Language- follow 3 step command   0 3 3  Language- read & follow direction   0 0 0  Write a sentence   0 0 0  Copy design   0 0 0  Total score   '17 20 19        '$ 08/31/2021    2:04 PM  6CIT Screen  What Year? 0 points  What month? 0 points  What time? 0 points  Count back from 20 0 points  Months in reverse 0 points  Repeat phrase 0 points  Total Score 0 points    Immunizations Immunization History  Administered Date(s) Administered   Fluad Quad(high Dose 65+) 11/17/2018   Influenza Split 01/03/2011, 01/17/2012   Influenza Whole 11/25/2007, 01/18/2009, 11/28/2009   Influenza, High Dose Seasonal PF 12/19/2016, 12/01/2017   Influenza,inj,Quad PF,6+ Mos 01/06/2013, 12/07/2013, 12/14/2014, 11/24/2015, 02/10/2020   Pneumococcal Conjugate-13 05/11/2013   Pneumococcal Polysaccharide-23 01/17/2012, 05/26/2017   Td 04/10/2010, 10/20/2020   Zoster, Live 05/13/2014    TDAP status: Up to date  Flu Vaccine  status: Up to date  Pneumococcal vaccine status: Up to date  Covid-19 vaccine status: Declined, Education has been provided regarding the importance of this vaccine but patient still declined. Advised may receive this vaccine at local pharmacy or Health Dept.or vaccine clinic. Aware to provide a copy of the vaccination record if obtained from local pharmacy or Health Dept. Verbalized acceptance and understanding.  Qualifies for Shingles Vaccine? Yes   Zostavax completed Yes   Shingrix Completed?: No.    Education has been provided regarding the importance of this vaccine. Patient has been advised to call insurance company to determine out of pocket expense if they have not yet received this vaccine. Advised may also receive vaccine at local pharmacy or Health Dept. Verbalized acceptance and understanding.  Screening Tests Health Maintenance  Topic Date Due   COVID-19 Vaccine (1) Never done   Zoster Vaccines- Shingrix (1 of 2) Never done   MAMMOGRAM  08/10/2021   URINE MICROALBUMIN  10/23/2021   INFLUENZA VACCINE  10/02/2021   OPHTHALMOLOGY EXAM  10/19/2021   FOOT EXAM  10/20/2021   HEMOGLOBIN A1C  01/26/2022   DEXA SCAN  08/30/2025   TETANUS/TDAP  10/21/2030   Pneumonia Vaccine 77+ Years old  Completed   Hepatitis C Screening  Completed   HPV VACCINES  Aged Out   COLONOSCOPY (Pts 45-78yr Insurance coverage will need to be confirmed)  Discontinued    Health Maintenance  Health Maintenance Due  Topic Date Due   COVID-19 Vaccine (1) Never done   Zoster Vaccines- Shingrix (1 of 2) Never done   MAMMOGRAM  08/10/2021   URINE MICROALBUMIN  10/23/2021    Colorectal cancer screening: No longer required.   Mammogram status: Completed 08/10/2020. Repeat every year - appt 09/10/21  Bone Density status: Completed 08/30/2020. Results reflect: Bone density results: NORMAL. Repeat every 5 years.  Lung Cancer Screening: (Low Dose CT Chest recommended if Age 827-80years, 30 pack-year  currently smoking OR have quit w/in 15years.) does not qualify.   Additional Screening:  Hepatitis C Screening: does qualify; Completed 05/09/2015  Vision Screening: Recommended annual ophthalmology exams for early detection of glaucoma and other disorders of the eye. Is the patient up to date with their annual eye exam?  Yes  Who is the provider or what is the name of the office in which the patient attends annual eye exams[place] [name] [place] not established with a provider, would they like to be referred to a provider to establish care? No .   Dental Screening: Recommended annual dental exams for proper oral hygiene  Community Resource Referral / Chronic Care Management: CRR required this visit?  No   CCM required this visit?  No      Plan:     I have personally reviewed and noted the following in the patient's chart:   Medical and social history Use of alcohol, tobacco or illicit drugs  Current medications and supplements including opioid prescriptions.  Functional ability and status Nutritional status Physical activity Advanced directives List of other physicians Hospitalizations, surgeries, and ER visits in previous 12 months Vitals Screenings to include cognitive, depression, and falls Referrals and appointments  In addition, I have reviewed and discussed with patient certain preventive protocols, quality metrics, and best practice recommendations. A written personalized care plan for preventive services as well as general preventive health recommendations were provided to patient.     ASandrea Hammond LPN   [id number]  Nurse Notes: None

## 2021-08-31 NOTE — Patient Instructions (Signed)
[name] , Thank you for taking time to come for your Medicare Wellness Visit. I appreciate your ongoing commitment to your health goals. Please review the following plan we discussed and let me know if I can assist you in the future.   Screening recommendations/referrals: Colonoscopy: Done 09/02/2017 - no repeat Mammogram: Done 08/10/2020 - Repeat annually - keep appointment 09/10/2021 Bone Density: Done 08/30/2020 - Repeat in 5 years  Recommended yearly ophthalmology/optometry visit for glaucoma screening and checkup Recommended yearly dental visit for hygiene and checkup  Vaccinations: Influenza vaccine: Done 2022 - Repeat annually in fall Pneumococcal vaccine: Done 05/11/2013 & 05/26/2017      Tdap vaccine: Done 10/20/2020 - Repeat in 10 years Shingles vaccine:  Zostavax done 2016 - due for Shingrix which is 2 doses 2-6 months apart and over 90% effective   Covid-19: Declined  Advanced directives: Please bring a copy of your health care power of attorney and living will to the office to be added to your chart at your convenience.   Conditions/risks identified: Aim for 30 minutes of exercise or brisk walking, 6-8 glasses of water, and 5 servings of fruits and vegetables each day.   Next appointment: Follow up in one year for your annual wellness visit    Preventive Care 65 Years and Older, Female Preventive care refers to lifestyle choices and visits with your health care provider that can promote health and wellness. What does preventive care include? A yearly physical exam. This is also called an annual well check. Dental exams once or twice a year. Routine eye exams. Ask your health care provider how often you should have your eyes checked. Personal lifestyle choices, including: Daily care of your teeth and gums. Regular physical activity. Eating a healthy diet. Avoiding tobacco and drug use. Limiting alcohol use. Practicing safe sex. Taking low-dose aspirin every day. Taking  vitamin and mineral supplements as recommended by your health care provider. What happens during an annual well check? The services and screenings done by your health care provider during your annual well check will depend on your age, overall health, lifestyle risk factors, and family history of disease. Counseling  Your health care provider may ask you questions about your: Alcohol use. Tobacco use. Drug use. Emotional well-being. Home and relationship well-being. Sexual activity. Eating habits. History of falls. Memory and ability to understand (cognition). Work and work Statistician. Reproductive health. Screening  You may have the following tests or measurements: Height, weight, and BMI. Blood pressure. Lipid and cholesterol levels. These may be checked every 5 years, or more frequently if you are over 51 years old. Skin check. Lung cancer screening. You may have this screening every year starting at age 64 if you have a 30-pack-year history of smoking and currently smoke or have quit within the past 15 years. Fecal occult blood test (FOBT) of the stool. You may have this test every year starting at age 64. Flexible sigmoidoscopy or colonoscopy. You may have a sigmoidoscopy every 5 years or a colonoscopy every 10 years starting at age 58. Hepatitis C blood test. Hepatitis B blood test. Sexually transmitted disease (STD) testing. Diabetes screening. This is done by checking your blood sugar (glucose) after you have not eaten for a while (fasting). You may have this done every 1-3 years. Bone density scan. This is done to screen for osteoporosis. You may have this done starting at age 2. Mammogram. This may be done every 1-2 years. Talk to your health care provider about how  often you should have regular mammograms. Talk with your health care provider about your test results, treatment options, and if necessary, the need for more tests. Vaccines  Your health care provider may  recommend certain vaccines, such as: Influenza vaccine. This is recommended every year. Tetanus, diphtheria, and acellular pertussis (Tdap, Td) vaccine. You may need a Td booster every 10 years. Zoster vaccine. You may need this after age 79. Pneumococcal 13-valent conjugate (PCV13) vaccine. One dose is recommended after age 66. Pneumococcal polysaccharide (PPSV23) vaccine. One dose is recommended after age 39. Talk to your health care provider about which screenings and vaccines you need and how often you need them. This information is not intended to replace advice given to you by your health care provider. Make sure you discuss any questions you have with your health care provider. Document Released: 03/17/2015 Document Revised: 11/08/2015 Document Reviewed: 12/20/2014 Elsevier Interactive Patient Education  2017 [employer] Prevention in the Home Falls can cause injuries. They can happen to people of all ages. There are many things you can do to make your home safe and to help prevent falls. What can I do on the outside of my home? Regularly fix the edges of walkways and driveways and fix any cracks. Remove anything that might make you trip as you walk through a door, such as a raised step or threshold. Trim any bushes or trees on the path to your home. Use bright outdoor lighting. Clear any walking paths of anything that might make someone trip, such as rocks or tools. Regularly check to see if handrails are loose or broken. Make sure that both sides of any steps have handrails. Any raised decks and porches should have guardrails on the edges. Have any leaves, snow, or ice cleared regularly. Use sand or salt on walking paths during winter. Clean up any spills in your garage right away. This includes oil or grease spills. What can I do in the bathroom? Use night lights. Install grab bars by the toilet and in the tub and shower. Do not use towel bars as grab bars. Use non-skid  mats or decals in the tub or shower. If you need to sit down in the shower, use a plastic, non-slip stool. Keep the floor dry. Clean up any water that spills on the floor as soon as it happens. Remove soap buildup in the tub or shower regularly. Attach bath mats securely with double-sided non-slip rug tape. Do not have throw rugs and other things on the floor that can make you trip. What can I do in the bedroom? Use night lights. Make sure that you have a light by your bed that is easy to reach. Do not use any sheets or blankets that are too big for your bed. They should not hang down onto the floor. Have a firm chair that has side arms. You can use this for support while you get dressed. Do not have throw rugs and other things on the floor that can make you trip. What can I do in the kitchen? Clean up any spills right away. Avoid walking on wet floors. Keep items that you use a lot in easy-to-reach places. If you need to reach something above you, use a strong step stool that has a grab bar. Keep electrical cords out of the way. Do not use floor polish or wax that makes floors slippery. If you must use wax, use non-skid floor wax. Do not have throw rugs and other things  on the floor that can make you trip. What can I do with my stairs? Do not leave any items on the stairs. Make sure that there are handrails on both sides of the stairs and use them. Fix handrails that are broken or loose. Make sure that handrails are as long as the stairways. Check any carpeting to make sure that it is firmly attached to the stairs. Fix any carpet that is loose or worn. Avoid having throw rugs at the top or bottom of the stairs. If you do have throw rugs, attach them to the floor with carpet tape. Make sure that you have a light switch at the top of the stairs and the bottom of the stairs. If you do not have them, ask someone to add them for you. What else can I do to help prevent falls? Wear shoes  that: Do not have high heels. Have rubber bottoms. Are comfortable and fit you well. Are closed at the toe. Do not wear sandals. If you use a stepladder: Make sure that it is fully opened. Do not climb a closed stepladder. Make sure that both sides of the stepladder are locked into place. Ask someone to hold it for you, if possible. Clearly mark and make sure that you can see: Any grab bars or handrails. First and last steps. Where the edge of each step is. Use tools that help you move around (mobility aids) if they are needed. These include: Canes. Walkers. Scooters. Crutches. Turn on the lights when you go into a dark area. Replace any light bulbs as soon as they burn out. Set up your furniture so you have a clear path. Avoid moving your furniture around. If any of your floors are uneven, fix them. If there are any pets around you, be aware of where they are. Review your medicines with your doctor. Some medicines can make you feel dizzy. This can increase your chance of falling. Ask your doctor what other things that you can do to help prevent falls. This information is not intended to replace advice given to you by your health care provider. Make sure you discuss any questions you have with your health care provider. Document Released: 12/15/2008 Document Revised: 07/27/2015 Document Reviewed: 03/25/2014 Elsevier Interactive Patient Education  2017 [employer].

## 2021-09-08 ENCOUNTER — Ambulatory Visit (HOSPITAL_COMMUNITY)
Admission: RE | Admit: 2021-09-08 | Discharge: 2021-09-08 | Disposition: A | Discharge status: Home / Self Care | Payer: Medicare HMO | Source: Ambulatory Visit | Attending: Internal Medicine | Admitting: Internal Medicine

## 2021-09-08 DIAGNOSIS — M5412 Radiculopathy, cervical region: Secondary | ICD-10-CM | POA: Insufficient documentation

## 2021-09-08 DIAGNOSIS — M542 Cervicalgia: Secondary | ICD-10-CM | POA: Diagnosis not present

## 2021-09-08 MED ORDER — GADOBUTROL 1 MMOL/ML IV SOLN
8.0000 mL | Freq: Once | INTRAVENOUS | Status: AC | PRN
  Administered 2021-09-08: 8 mL via INTRAVENOUS

## 2021-09-10 ENCOUNTER — Ambulatory Visit
Admission: RE | Admit: 2021-09-10 | Discharge: 2021-09-10 | Disposition: A | Discharge status: Home / Self Care | Payer: Medicare HMO | Source: Ambulatory Visit | Attending: Family Medicine | Admitting: Family Medicine

## 2021-09-10 DIAGNOSIS — Z1231 Encounter for screening mammogram for malignant neoplasm of breast: Secondary | ICD-10-CM

## 2021-09-11 ENCOUNTER — Encounter: Payer: Self-pay | Admitting: Family Medicine

## 2021-09-11 DIAGNOSIS — N3941 Urge incontinence: Secondary | ICD-10-CM | POA: Diagnosis not present

## 2021-09-11 DIAGNOSIS — M5412 Radiculopathy, cervical region: Secondary | ICD-10-CM

## 2021-09-13 NOTE — Addendum Note (Signed)
Addended by: Eliezer Lofts E on: 09/13/2021 06:47 PM   Modules accepted: Orders

## 2021-09-17 ENCOUNTER — Telehealth: Payer: Self-pay | Admitting: Internal Medicine

## 2021-09-17 NOTE — Telephone Encounter (Signed)
Patient c/o Palpitations:  High priority if patient c/o lightheadedness, shortness of breath, or chest pain  How long have you had palpitations/irregular HR/ Afib? Are you having the symptoms now? This weekend; yes   Are you currently experiencing lightheadedness, SOB or CP? lightheadedness  Do you have a history of afib (atrial fibrillation) or irregular heart rhythm? Afib   Have you checked your BP or HR? (document readings if available): 119/63; 55; 137/68; 55  Are you experiencing any other symptoms? Lethargic, really tired, make self do something

## 2021-09-17 NOTE — Telephone Encounter (Signed)
This recording is most consistent with sinus rhythm and PACs without evidence of atrial fibrillation.

## 2021-09-17 NOTE — Telephone Encounter (Signed)
Spoke with pt and advised per Dr Caryl Comes of Loop transmission results.  Recommended pt also follow up with her PCP re: symptoms she experienced over the weekend.  Pt verbalizes understanding and agrees with current plan.

## 2021-09-17 NOTE — Telephone Encounter (Signed)
Spoke with pt who states she feels as if she is going in and out of Afib.  Complaining of lightheadedness and fatigue this weekend.  Vital signs below per pt.  Pt reports she is taking medications as prescribed and has not missed any doses. Requested pt send a loop transmission for review and further recommendation.  Pt verbalizes understanding and agrees with current plan.

## 2021-09-18 NOTE — Progress Notes (Signed)
Carelink Summary Report / Loop Recorder 

## 2021-09-20 ENCOUNTER — Ambulatory Visit (HOSPITAL_COMMUNITY): Payer: Medicare HMO | Admitting: Physician Assistant

## 2021-09-24 ENCOUNTER — Other Ambulatory Visit: Payer: Self-pay | Admitting: Internal Medicine

## 2021-09-24 ENCOUNTER — Other Ambulatory Visit: Payer: Self-pay | Admitting: Family Medicine

## 2021-09-24 NOTE — Telephone Encounter (Signed)
Please schedule Medicare Wellness Visit with nurse and CPE with fasting labs prior with Dr. Diona Browner for after 10/20/2021.

## 2021-09-24 NOTE — Telephone Encounter (Signed)
Spoke with pt schedule CPE with labs prior

## 2021-09-24 NOTE — Telephone Encounter (Signed)
Patient had AWV done in June 2023

## 2021-09-25 DIAGNOSIS — N3946 Mixed incontinence: Secondary | ICD-10-CM | POA: Diagnosis not present

## 2021-09-25 DIAGNOSIS — R350 Frequency of micturition: Secondary | ICD-10-CM | POA: Diagnosis not present

## 2021-10-01 ENCOUNTER — Ambulatory Visit (INDEPENDENT_AMBULATORY_CARE_PROVIDER_SITE_OTHER): Payer: Medicare HMO

## 2021-10-01 DIAGNOSIS — I4819 Other persistent atrial fibrillation: Secondary | ICD-10-CM

## 2021-10-02 LAB — CUP PACEART REMOTE DEVICE CHECK
Date Time Interrogation Session: 20230801073628
Implantable Pulse Generator Implant Date: 20210816

## 2021-10-05 ENCOUNTER — Telehealth: Payer: Self-pay

## 2021-10-05 DIAGNOSIS — E119 Type 2 diabetes mellitus without complications: Secondary | ICD-10-CM

## 2021-10-05 MED ORDER — METFORMIN HCL ER 500 MG PO TB24: 500.0000 mg | ORAL_TABLET | Freq: Two times a day (BID) | ORAL | 0 refills | Status: DC

## 2021-10-05 MED ORDER — RYBELSUS 3 MG PO TABS: 3.0000 mg | ORAL_TABLET | Freq: Every day | ORAL | 2 refills | Status: DC

## 2021-10-05 NOTE — Telephone Encounter (Signed)
T, Can we try again Rybelsus?  She apparently developed diarrhea when she tried it in the past but I wonder if this was not related to metformin... For now, I would continue with only 1 tablet twice a day for the metformin and try to add 3 mg of Rybelsus before breakfast.  If she tolerates this well, please let me know  so we can double the dose, especially if sugars are not controlled enough afterwards.

## 2021-10-05 NOTE — Telephone Encounter (Signed)
Pt rep called on pt behalf to advise pt is not tolerating Metformin as prescribed well. Pt has self adjusted and is only taking 1 tablet BID. Pt experienced diarrhea on the 2 tablets BID regimen. Pt did state her blood sugars have not been as controlled.   Does pt need to be seen sooner for follow up?

## 2021-10-05 NOTE — Telephone Encounter (Signed)
Pt agreed to try Rybelsus again. Rx sent to preferred pharmacy.

## 2021-10-08 ENCOUNTER — Ambulatory Visit (INDEPENDENT_AMBULATORY_CARE_PROVIDER_SITE_OTHER)
Admission: RE | Admit: 2021-10-08 | Discharge: 2021-10-08 | Disposition: A | Discharge status: Home / Self Care | Payer: Medicare HMO | Source: Ambulatory Visit | Attending: Family Medicine | Admitting: Family Medicine

## 2021-10-08 ENCOUNTER — Encounter: Payer: Self-pay | Admitting: Family Medicine

## 2021-10-08 ENCOUNTER — Telehealth: Payer: Self-pay

## 2021-10-08 ENCOUNTER — Telehealth: Payer: Medicare HMO

## 2021-10-08 ENCOUNTER — Ambulatory Visit (INDEPENDENT_AMBULATORY_CARE_PROVIDER_SITE_OTHER): Payer: Medicare HMO | Admitting: Family Medicine

## 2021-10-08 VITALS — BP 140/62 | HR 70 | Temp 97.5°F | Ht 65.0 in | Wt 177.4 lb

## 2021-10-08 DIAGNOSIS — M7501 Adhesive capsulitis of right shoulder: Secondary | ICD-10-CM | POA: Diagnosis not present

## 2021-10-08 DIAGNOSIS — M25611 Stiffness of right shoulder, not elsewhere classified: Secondary | ICD-10-CM

## 2021-10-08 DIAGNOSIS — I498 Other specified cardiac arrhythmias: Secondary | ICD-10-CM

## 2021-10-08 DIAGNOSIS — M25511 Pain in right shoulder: Secondary | ICD-10-CM | POA: Diagnosis not present

## 2021-10-08 DIAGNOSIS — E119 Type 2 diabetes mellitus without complications: Secondary | ICD-10-CM | POA: Diagnosis not present

## 2021-10-08 DIAGNOSIS — M7502 Adhesive capsulitis of left shoulder: Secondary | ICD-10-CM

## 2021-10-08 LAB — HM DIABETES FOOT EXAM

## 2021-10-08 MED ORDER — TRIAMCINOLONE ACETONIDE 40 MG/ML IJ SUSP
40.0000 mg | Freq: Once | INTRAMUSCULAR | Status: AC
  Administered 2021-10-08: 40 mg via INTRA_ARTICULAR

## 2021-10-08 NOTE — Telephone Encounter (Signed)
  Care Management   Follow Up Note   10/08/2021 Name: [name] MRN: [id number] DOB: 09-24-1945   Referred by: Jinny Sanders, MD Reason for referral : Care Coordination   An unsuccessful telephone outreach was attempted today. The patient was referred to the case management team for assistance with care management and care coordination.   Follow Up Plan: The care management team will reach out to the patient again over the next 14 days.   Quinn Plowman RN,BSN,CCM RN Care Manager Coordinator [facility] [phone]

## 2021-10-08 NOTE — Progress Notes (Signed)
Emily Lillibridge T. Eagle Pitta, MD, [facility] [place], [address]  Phone: [phone]  FAX: [phone]  [name] - 76 y.o. female  MRN [id number]  Date of Birth: [birth year]  Date: 10/08/2021  PCP: Emily Sanders, MD  Referral: Emily Sanders, MD  Chief Complaint  Patient presents with   Unable to Lift Arms   Subjective:   [name] is a 76 y.o. very pleasant female patient who presents with the following: shoulder pain  The patient noted above presents with B shoulder pain that has been ongoing for 2 mo. there is no history of trauma or accident. The patient denies neck pain or radicular symptoms. No shoulder blade pain Denies dislocation, subluxation, separation of the shoulder. The patient does complain of pain with flexion, abduction, and terminal motion.  Significant restriction of motion. she describes a deep ache around the shoulder, and sometimes it will wake the patient up at night.  She presents with pain and inability to elevate her arms. SHoulder pain with loss of motion, 2 months.  No injury.   DM and cardiac disease, DM for 10 years.   Ice or Heat: minimal help Tried PT: No  Prior shoulder Injury: No Prior surgery: No Prior fracture: No  SEND [name] A NOTE ABOUT SOME ANXIETY MEDICATION - was on Lexapro, had to stop when went on Tikosyn.  Review of Systems is noted in the HPI, as appropriate  Objective:   Blood pressure (!) 140/62, pulse 70, temperature (!) 97.5 F (36.4 C), temperature source Oral, height '5\' 5"'$  (1.651 m), weight 177 lb 6 oz (80.5 kg), SpO2 97 %.   Shoulder: R and L Inspection: No muscle wasting or winging Ecchymosis/edema: neg  AC joint, scapula, clavicle: NT Cervical spine: NT, full ROM Spurling's: neg ABNORMAL SIDE TESTED: B UNLESS OTHERWISE NOTED, THE CONTRALATERAL SIDE HAS FULL RANGE OF MOTION. Abduction: 5/5, LIMITED TO right side limited to 90  degrees, left side limited to 105 DEGREES Flexion: 5/5, LIMITED TO RIGHT SIDE LIMITED TO 90 DEGREES, AND LEFT SIDE IS LIMITED TO 110 DEG IR, lift-off: 5/5. TESTED AT 90 DEGREES OF ABDUCTION, LIMITED TO 0 DEGREES ER at neutral:  5/5, TESTED AT 90 DEGREES OF ABDUCTION, LIMITED TO 15 DEGREES AC crossover and compression: PAIN Drop Test: neg Empty Can: neg Supraspinatus insertion: NT Bicipital groove: NT ALL OTHER SPECIAL TESTING EQUIVOCAL GIVEN LOSS OF MOTION C5-T1 intact Sensation intact Grip 5/5   On a prior chest x-ray, and able to visualize the left glenohumeral joint and there is no significant degenerative change.  I am unable to visualize the right side glenohumeral joint on the similar film.  I cannot visualize this on any other additional film.  The radiological images were independently reviewed by myself in the office and results were reviewed with the patient. My independent interpretation of images:   Xrays: Shoulder series Indication: shoulder pain Findings: No evidence of occult fracture Moderate to advanced GH arthritis with bulky osteophytosis and inferior bony hypertrophy. Electronically Signed  By: Owens Loffler, MD On: 10/08/2021  9:40 AM EDT   Assessment and Plan:     ICD-10-CM   1. Adhesive capsulitis of right shoulder  M75.01     2. Decreased range of motion of right shoulder  M25.611 DG Shoulder Right    3. Adhesive capsulitis of left shoulder  M75.02     4. Controlled type 2 diabetes mellitus without complication, without  long-term current use of insulin (HCC)  E11.9     5. 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  My suspicion is that the primary driver is adhesive capsulitis, but there is definitely very significant osteoarthritis on the right side of the glenohumeral joint.  This certainly impacts range of motion, as  well.  Patient was given a systematic ROM protocol from [employer] to be done daily. Emphasized importance of adherence, help of PT, daily HEP.  The average length of total symptoms is 12-18 months going through 3 different phases in the freezing and thawing process. There can be a permanent loss of motion in some cases.   Intraarticular shoulder injections discussed with patient, which have good evidence for accelerating the thawing phase.  At this point, her motion is quite poor.  Basic range of motion right now, and hopefully she will start the thawing progress soon.  My anticipation is this will take some time on the order of many months.  Intraarticular Shoulder Aspiration/Injection Procedure Note [name] [birth year] Date of procedure: 10/08/2021  Procedure: Large Joint Aspiration / Injection of Shoulder, Intraarticular, R Indications: Pain  Procedure Details Verbal consent was obtained from the patient. Risks explained and contrasted with benefits and alternatives. Patient prepped with Chloraprep and Ethyl Chloride used for anesthesia. An intraarticular shoulder injection was performed using the posterior approach; needle placed into joint capsule without difficulty. The patient tolerated the procedure well and had decreased pain post injection. No complications. Injection: 9 cc of Lidocaine 1% and 1 mL Kenalog 40 mg. Needle: 21 gauge, 2 inch   Follow-up: 10 weeks  Orders Placed This Encounter  Procedures   DG Shoulder Right    Signed,  Eladio Dentremont T. Akiba Melfi, MD   Outpatient Encounter Medications as of 10/08/2021  Medication Sig   Ascorbic Acid (VITAMIN C PO) Take 1 tablet by mouth at bedtime.   atorvastatin (LIPITOR) 10 MG tablet TAKE ONE TABLET BY MOUTH AT BEDTIME   calcium carbonate (OS-CAL - DOSED IN MG OF ELEMENTAL CALCIUM) 1250 (500 Ca) MG tablet Take 1 tablet by mouth daily with breakfast.   dofetilide (TIKOSYN) 250 MCG capsule Take 1 capsule (250 mcg total) by mouth  2 (two) times daily.   ELIQUIS 5 MG TABS tablet TAKE 1 TABLET BY MOUTH TWICE A DAY   hydroxypropyl methylcellulose / hypromellose (ISOPTO TEARS / GONIOVISC) 2.5 % ophthalmic solution Place 1 drop into both eyes 2 (two) times daily as needed for dry eyes.   linagliptin (TRADJENTA) 5 MG TABS tablet Take 1 tablet (5 mg total) by mouth daily.   magnesium oxide (MAG-OX) 400 MG tablet Take 1 tablet (400 mg total) by mouth 2 (two) times daily.   metFORMIN (GLUCOPHAGE-XR) 500 MG 24 hr tablet Take 1 tablet (500 mg total) by mouth in the morning and at bedtime.   metoprolol succinate (TOPROL-XL) 50 MG 24 hr tablet Take one tablet by mouth at bedtime   MYRBETRIQ 50 MG TB24 tablet Take 50 mg by mouth daily.   ONETOUCH ULTRA test strip CHECK BLOOD SUGAR 2 TIMES A DAY AS DIRECTED DX CODE E11.9   [DISCONTINUED] Semaglutide (RYBELSUS) 3 MG TABS Take 3 mg by mouth daily.   No facility-administered encounter medications on file as of 10/08/2021.

## 2021-10-11 DIAGNOSIS — N3946 Mixed incontinence: Secondary | ICD-10-CM | POA: Diagnosis not present

## 2021-10-12 ENCOUNTER — Ambulatory Visit: Payer: Medicare HMO | Admitting: Family Medicine

## 2021-10-22 ENCOUNTER — Telehealth: Payer: Self-pay | Admitting: Internal Medicine

## 2021-10-22 ENCOUNTER — Ambulatory Visit: Payer: Medicare HMO

## 2021-10-22 DIAGNOSIS — E119 Type 2 diabetes mellitus without complications: Secondary | ICD-10-CM | POA: Diagnosis not present

## 2021-10-22 DIAGNOSIS — H524 Presbyopia: Secondary | ICD-10-CM | POA: Diagnosis not present

## 2021-10-22 LAB — HM DIABETES EYE EXAM

## 2021-10-22 NOTE — Telephone Encounter (Signed)
   Pt c/o medication issue:  1. Name of Medication: dofetilide (TIKOSYN) 250 MCG capsule  2. How are you currently taking this medication (dosage and times per day)? Take 1 capsule (250 mcg total) by mouth 2 (two) times daily.  3. Are you having a reaction (difficulty breathing--STAT)?   4. What is your medication issue? Pt said, since she started taking this medication she cant get any sleep even though she takes melatonin. Also, her watch giving her signs of afib, she feels CP sometimes and dizziness. She said, her BP and HR are good

## 2021-10-22 NOTE — Chronic Care Management (AMB) (Signed)
Care Management    RN Visit Note  10/22/2021 Name: [name] MRN: [id number] DOB: Aug 15, 1945  Subjective: [name] is a 76 y.o. year old female who is a primary care patient of Bedsole, Amy E, MD. The care management team was consulted for assistance with disease management and care coordination needs.    Engaged with patient by telephone for follow up visit in response to provider referral for case management and/or care coordination services.   Consent to Services:   [name] was given information about Care Management services today including:  Care Management services includes personalized support from designated clinical staff supervised by her physician, including individualized plan of care and coordination with other care providers 24/7 contact phone numbers for assistance for urgent and routine care needs. The patient may stop case management services at any time by phone call to the office staff.  Patient agreed to services and consent obtained.   Assessment: Review of patient past medical history, allergies, medications, health status, including review of consultants reports, laboratory and other test data, was performed as part of comprehensive evaluation and provision of chronic care management services.   SDOH (Social Determinants of Health) assessments and interventions performed:    Care Plan  Allergies  Allergen Reactions   Cortisone Shortness Of Breath and Rash   Pravachol [Pravastatin Sodium] Other (See Comments)    Leg pain    Contrast Media [Iodinated Contrast Media] Rash    Outpatient Encounter Medications as of 10/22/2021  Medication Sig   Ascorbic Acid (VITAMIN C PO) Take 1 tablet by mouth at bedtime.   atorvastatin (LIPITOR) 10 MG tablet TAKE ONE TABLET BY MOUTH AT BEDTIME   calcium carbonate (OS-CAL - DOSED IN MG OF ELEMENTAL CALCIUM) 1250 (500 Ca) MG tablet Take 1 tablet by mouth daily with breakfast.   dofetilide (TIKOSYN) 250 MCG  capsule Take 1 capsule (250 mcg total) by mouth 2 (two) times daily.   ELIQUIS 5 MG TABS tablet TAKE 1 TABLET BY MOUTH TWICE A DAY   hydroxypropyl methylcellulose / hypromellose (ISOPTO TEARS / GONIOVISC) 2.5 % ophthalmic solution Place 1 drop into both eyes 2 (two) times daily as needed for dry eyes.   linagliptin (TRADJENTA) 5 MG TABS tablet Take 1 tablet (5 mg total) by mouth daily.   magnesium oxide (MAG-OX) 400 MG tablet Take 1 tablet (400 mg total) by mouth 2 (two) times daily.   metFORMIN (GLUCOPHAGE-XR) 500 MG 24 hr tablet Take 1 tablet (500 mg total) by mouth in the morning and at bedtime.   metoprolol succinate (TOPROL-XL) 50 MG 24 hr tablet Take one tablet by mouth at bedtime   MYRBETRIQ 50 MG TB24 tablet Take 50 mg by mouth daily.   ONETOUCH ULTRA test strip CHECK BLOOD SUGAR 2 TIMES A DAY AS DIRECTED DX CODE E11.9   No facility-administered encounter medications on file as of 10/22/2021.    Patient Active Problem List   Diagnosis Date Noted   Closed fracture of metacarpal bone 02/27/2021   Pain in right hand 02/27/2021   Dizziness 02/20/2021   Intractable acute post-traumatic headache 02/20/2021   Secondary hypercoagulable state ([place]) 11/27/2020   Cellulitis of left lower extremity 10/20/2020   Closed fracture of base of fifth metatarsal bone, left, initial encounter 07/26/2020   Ear pain, left 07/21/2019   Atrial tachycardia ([place]) 09/15/2018   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   Status post total hip replacement, 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   Lumbar radiculopathy 01/05/2018   Vitamin  B 12 deficiency    Urge incontinence    OSA on CPAP    History of hiatal hernia    GERD (gastroesophageal reflux disease)    Anxiety    Other constipation 01/02/2017   Class 1 obesity with serious comorbidity and body mass index (BMI) of 30.0 to 30.9 in adult 01/02/2017   Overweight (BMI 25.0-29.9) 12/19/2016   Persistent atrial fibrillation ([place]) 12/17/2016   Vitamin D deficiency 11/20/2016   Family  history of ovarian cancer 05/24/2016   Iron deficiency anemia    Hypertension associated with diabetes ([place])    Preoperative evaluation to rule out surgical contraindication 05/16/2015   Controlled diabetes mellitus type II without complication ([place]) 24/40/1027   Memory loss 05/13/2014   Mitral regurgitation 10/11/2013   Incidental lung nodule 07/16/2011   SCIATICA, RIGHT 04/10/2010   Hyperlipidemia associated with type 2 diabetes mellitus ([place]) 06/11/2006   MDD (major depressive disorder), recurrent episode, mild ([place]) 06/11/2006   HEMORRHOIDS, INTERNAL 06/11/2006   DIVERTICULOSIS, COLON 06/11/2006   STRESS INCONTINENCE 06/11/2006    Conditions to be addressed/monitored: Atrial Fibrillation, HTN, DMII, and falls, bilateral shoulder pain  Care Plan : RNCM Plan of care  Updates made by Dannielle Karvonen, RN since 10/22/2021 12:00 AM     Problem: Chronic disease management , education, and/ or care coordination needs.   Priority: High     Long-Range Goal: Development of plan of care to address chronic disease manaement and / or care coordination needs   Start Date: 01/23/2021  Expected End Date: 11/01/2021  Priority: High  Note:   Resolving due to duplicate goal Current Barriers:  Chronic Disease Management support and education needs related to Atrial Fibrillation, HTN, and DMII, falls Patient reports having bilateral shoulder pain over the last 2 months. She states she saw provider on 10/08/21 and was given injection in right shoulder and advised to do ROM exercises.  Patient states she obtained immediate results and decrease in pain on in right shoulder. She reports ongoing pain in left shoulder.  Patient states she has not been able to sleep since being on Tikosyn treatment. She reports a-fib episodes with chest pain. Patient states she reported these symptoms to the cardiology office today and is currently waiting for return call to schedule follow up visit. Patient reports pulse rates  ranging from 50-80.  She states she still has occasional dizziness.  Patient denies any falls since last outreach with RNCM.  Patient reports seeing eye doctor today and having good report.  RNCM Clinical Goal(s):  Patient will verbalize understanding of plan for management of Atrial Fibrillation, HTN, and DMII take all medications exactly as prescribed and will call provider for medication related questions attend all scheduled medical appointments:  continue to work with RN Care Manager to address care management and care coordination needs related to  Atrial Fibrillation, HTN, and DMII through collaboration with RN Care manager, provider, and care team.   Interventions: 1:1 collaboration with primary care provider regarding development and update of comprehensive plan of care as evidenced by provider attestation and co-signature Inter-disciplinary care team collaboration (see longitudinal plan of care) Evaluation of current treatment plan related to  self management and patient's adherence to plan as established by provider  Hypertension Interventions: Goalmet Last practice recorded BP readings:  BP Readings from Last 3 Encounters:  10/08/21 (!) 140/62  08/30/21 (!) 152/78  08/30/21 126/66  Most recent eGFR/CrCl:  Lab Results  Component Value Date   EGFR 60 05/25/2020  No components found for: "CRCL"  Reviewed medications with patient and discussed importance of compliance; Reviewed scheduled/upcoming provider appointments including:  Advised patient, providing education and rationale to continue to monitor blood pressure and record, calling PCP for findings outside of established parameters  Diabetes Interventions: Goal Met.  Assessed patient's understanding of A1c goal: <7% Reviewed medications with patient and discussed importance of medication adherence; Reviewed scheduled/upcoming provider appointments Advised patient to continue to monitor blood sugars daily and notify her  doctor for findings outside established parameters Lab Results  Component Value Date   HGBA1C 6.4 (A) 07/26/2021  AFIB Interventions: ( Status:Resolving due to duplicate goal) Discussed plans with patient for ongoing care management follow up and provided patient with direct contact information for care management team; Reviewed scheduled/upcoming provider appointments Advised to continue monitoring pulse rate Advised to report any new or ongoing symptoms to provider Medications reviewed and discussed importance of compliance   Falls Interventions:  (Status: Resolving due to duplicate goal  Reviewed medications and discussed importance of compliance Advised patient of importance of notifying provider of falls Advised to continue to follow fall precautions  Bilateral Shoulder pain: (Resolving due to duplicate goal) Advised patient to contact provider regarding ongoing left should pain Advised to continue ROM exercises as advised by provider Discussed non pharmacologic pain management treatment ( ice/ heat, Exercise, rest)   Patient Goals/Self-Care Activities: Continue to take medications as prescribed   Attend all scheduled provider appointments Call pharmacy for medication refills 3-7 days in advance of running out of medications Call provider office for new concerns or questions  Continue to check pulse rate daily Continue to check blood sugars as recommended by your provider ( take blood sugar diary to your provider visit) Use treatments such as ice, heat, exercise, rest for shoulder pain management.  Continue to follow fall precautions        Plan: The care management team will reach out to the patient again over the next 45 days.  Quinn Plowman RN,BSN,CCM RN Care Manager Coordinator [phone]

## 2021-10-22 NOTE — Telephone Encounter (Signed)
AT/AF events noted on device (4.0%) although this does not appear to be a new finding.

## 2021-10-22 NOTE — Telephone Encounter (Signed)
Spoke with pt and advised per [name], Afib clinic: Pt had to stop trazodone to start tikosyn - she will need to follow up with primary for recommendation of replacement that is not QT prolonging to aid with insomnia per donna  Pt also advised per device clinic RN Linq shows AT/AF 4.0% and that this is not a new finding.  Reviewed ED precautions with pt.  Pt has not yet been scheduled for appointment with Dr Fletcher Anon ot PA for further evaluation of CP.  Pt verbalizes understanding and agrees with current plan.

## 2021-10-22 NOTE — Patient Instructions (Signed)
Visit Information  Thank you for taking time to visit with me today. Please don't hesitate to contact me if I can be of assistance to you before our next scheduled telephone appointment.  Following are the goals we discussed today:  Continue to take medications as prescribed   Attend all scheduled provider appointments Call pharmacy for medication refills 3-7 days in advance of running out of medications Call provider office for new concerns or questions  Continue to check pulse rate daily Continue to check blood sugars as recommended by your provider ( take blood sugar diary to your provider visit) Use treatments such as ice, heat, exercise, rest for shoulder pain management.  Continue to follow fall precautions  Our next appointment is by telephone on 11/29/21 at 2:30 pm  Please call the care guide team at [phone] if you need to cancel or reschedule your appointment.   If you are experiencing a Mental Health or [place] or need someone to talk to, please call the Suicide and Crisis Lifeline: 988 call 1-800-273-TALK (toll free, 24 hour hotline)   Patient verbalizes understanding of instructions and care plan provided today and agrees to view in [place]. Active MyChart status and patient understanding of how to access instructions and care plan via MyChart confirmed with patient.     Quinn Plowman RN,BSN,CCM RN Care Manager Coordinator [phone]

## 2021-10-22 NOTE — Telephone Encounter (Signed)
Spoke with pt who complains of not being able to sleep since starting Tikosyn. She states she has tried OTC Melatonin without success.  She has picked up some Afib on her watch.  She also complains of some right - sided, intermittent  CP that last for about 5 minutes.  It has awakened her from sleep.  She states nothing makes it better or worse.  She also complains of dizziness mostly when bending over and standing back up.   Will request device clinic review pt's loop recordings and will forward to Afib clinic for further advisement as Dr Caryl Comes is out of the office this week..  will also forward to [facility] scheduling to see about arranging and OV with Dr Fletcher Anon or a PA to further investigate her CP.

## 2021-10-24 ENCOUNTER — Telehealth: Payer: Self-pay | Admitting: Family Medicine

## 2021-10-24 ENCOUNTER — Other Ambulatory Visit (INDEPENDENT_AMBULATORY_CARE_PROVIDER_SITE_OTHER): Payer: Medicare HMO

## 2021-10-24 ENCOUNTER — Ambulatory Visit: Payer: Medicare HMO

## 2021-10-24 DIAGNOSIS — E538 Deficiency of other specified B group vitamins: Secondary | ICD-10-CM

## 2021-10-24 DIAGNOSIS — D508 Other iron deficiency anemias: Secondary | ICD-10-CM

## 2021-10-24 DIAGNOSIS — E559 Vitamin D deficiency, unspecified: Secondary | ICD-10-CM

## 2021-10-24 DIAGNOSIS — E119 Type 2 diabetes mellitus without complications: Secondary | ICD-10-CM

## 2021-10-24 LAB — COMPREHENSIVE METABOLIC PANEL
ALT: 11 U/L (ref 0–35)
AST: 12 U/L (ref 0–37)
Albumin: 3.9 g/dL (ref 3.5–5.2)
Alkaline Phosphatase: 61 U/L (ref 39–117)
BUN: 13 mg/dL (ref 6–23)
CO2: 29 mEq/L (ref 19–32)
Calcium: 9.3 mg/dL (ref 8.4–10.5)
Chloride: 100 mEq/L (ref 96–112)
Creatinine, Ser: 1.05 mg/dL (ref 0.40–1.20)
GFR: 52.31 mL/min — ABNORMAL LOW (ref >60.00)
Glucose, Bld: 143 mg/dL — ABNORMAL HIGH (ref 70–99)
Potassium: 4.6 mEq/L (ref 3.5–5.1)
Sodium: 136 mEq/L (ref 135–145)
Total Bilirubin: 0.3 mg/dL (ref 0.2–1.2)
Total Protein: 6.2 g/dL (ref 6.0–8.3)

## 2021-10-24 LAB — CBC WITH DIFFERENTIAL/PLATELET
Basophils Absolute: 0 10*3/uL (ref 0.0–0.1)
Basophils Relative: 0.4 % (ref 0.0–3.0)
Eosinophils Absolute: 0.1 10*3/uL (ref 0.0–0.7)
Eosinophils Relative: 1.1 % (ref 0.0–5.0)
HCT: 31.6 % — ABNORMAL LOW (ref 36.0–46.0)
Hemoglobin: 10.6 g/dL — ABNORMAL LOW (ref 12.0–15.0)
Lymphocytes Relative: 30 % (ref 12.0–46.0)
Lymphs Abs: 1.7 10*3/uL (ref 0.7–4.0)
MCHC: 33.4 g/dL (ref 30.0–36.0)
MCV: 86.3 fl (ref 78.0–100.0)
Monocytes Absolute: 0.4 10*3/uL (ref 0.1–1.0)
Monocytes Relative: 6.3 % (ref 3.0–12.0)
Neutro Abs: 3.5 10*3/uL (ref 1.4–7.7)
Neutrophils Relative %: 62.2 % (ref 43.0–77.0)
Platelets: 333 10*3/uL (ref 150.0–400.0)
RBC: 3.67 Mil/uL — ABNORMAL LOW (ref 3.87–5.11)
RDW: 14 % (ref 11.5–15.5)
WBC: 5.6 10*3/uL (ref 4.0–10.5)

## 2021-10-24 LAB — LIPID PANEL
Cholesterol: 147 mg/dL (ref 0–200)
HDL: 57.7 mg/dL (ref >39.00)
LDL Cholesterol: 73 mg/dL (ref 0–99)
NonHDL: 89.01
Total CHOL/HDL Ratio: 3
Triglycerides: 79 mg/dL (ref 0.0–149.0)
VLDL: 15.8 mg/dL (ref 0.0–40.0)

## 2021-10-24 LAB — IBC + FERRITIN
Ferritin: 8 ng/mL — ABNORMAL LOW (ref 10.0–291.0)
Iron: 54 ug/dL (ref 42–145)
Saturation Ratios: 12.2 % — ABNORMAL LOW (ref 20.0–50.0)
TIBC: 441 ug/dL (ref 250.0–450.0)
Transferrin: 315 mg/dL (ref 212.0–360.0)

## 2021-10-24 LAB — HEMOGLOBIN A1C: Hgb A1c MFr Bld: 8 % — ABNORMAL HIGH (ref 4.6–6.5)

## 2021-10-24 LAB — VITAMIN B12: Vitamin B-12: 387 pg/mL (ref 211–911)

## 2021-10-24 LAB — VITAMIN D 25 HYDROXY (VIT D DEFICIENCY, FRACTURES): VITD: 50.15 ng/mL (ref 30.00–100.00)

## 2021-10-24 NOTE — Telephone Encounter (Signed)
Error

## 2021-10-24 NOTE — Telephone Encounter (Signed)
-----   Message from [name] Sia sent at 10/24/2021  8:56 AM EDT ----- Regarding: Lab orders for now Patient is scheduled for CPX labs, please order future labs, Thanks , Karna Christmas

## 2021-10-24 NOTE — Progress Notes (Signed)
No critical labs need to be addressed urgently. We will discuss labs in detail at upcoming office visit.   

## 2021-10-25 ENCOUNTER — Other Ambulatory Visit: Payer: Medicare HMO

## 2021-10-25 NOTE — Telephone Encounter (Signed)
Spoke with pt and advised appointment scheduled with Christell Faith, PA-C 11/07/2021 at 1055am for intermittent CP.  Pt is aware if she experiences pain again prior to appointment she will need to be seen in the ED for further evaluation.  Pt thanked Therapist, sports for the phone call.

## 2021-10-26 ENCOUNTER — Encounter: Payer: Self-pay | Admitting: Family Medicine

## 2021-10-26 ENCOUNTER — Ambulatory Visit (INDEPENDENT_AMBULATORY_CARE_PROVIDER_SITE_OTHER): Payer: Medicare HMO | Admitting: Family Medicine

## 2021-10-26 VITALS — BP 140/62 | HR 67 | Temp 97.6°F | Ht 65.0 in | Wt 176.4 lb

## 2021-10-26 DIAGNOSIS — R69 Illness, unspecified: Secondary | ICD-10-CM | POA: Diagnosis not present

## 2021-10-26 DIAGNOSIS — K5900 Constipation, unspecified: Secondary | ICD-10-CM

## 2021-10-26 DIAGNOSIS — F330 Major depressive disorder, recurrent, mild: Secondary | ICD-10-CM | POA: Diagnosis not present

## 2021-10-26 DIAGNOSIS — I4819 Other persistent atrial fibrillation: Secondary | ICD-10-CM

## 2021-10-26 DIAGNOSIS — F5104 Psychophysiologic insomnia: Secondary | ICD-10-CM | POA: Diagnosis not present

## 2021-10-26 DIAGNOSIS — F411 Generalized anxiety disorder: Secondary | ICD-10-CM

## 2021-10-26 MED ORDER — SERTRALINE HCL 25 MG PO TABS: 25.0000 mg | ORAL_TABLET | Freq: Every day | ORAL | 3 refills | Status: DC

## 2021-10-26 NOTE — Patient Instructions (Addendum)
Increase water some, increase fiber in diet/supplement ( Metamucil or Benefiber).  Start Miralax daily 17 gram. Can Senakot as needed.  Start sertraline 25 mg  at bedtime for mood/sleep.

## 2021-10-26 NOTE — Progress Notes (Signed)
Patient ID: [name], female    DOB: 08/21/1945, 76 y.o.   MRN: [id number]  This visit was conducted in person.  BP (!) 140/62   Pulse 67   Temp 97.6 F (36.4 C) (Oral)   Ht '5\' 5"'$  (1.651 m)   Wt 176 lb 6 oz (80 kg)   LMP  (LMP Unknown)   SpO2 98%   BMI 29.35 kg/m    CC:  Chief Complaint  Patient presents with   Insomnia        Anxiety   Constipation    Subjective:   HPI: [name] is a 76 y.o. female  with persistent atrial fibrillation presenting on 10/26/2021 for Insomnia (/), Anxiety, and Constipation   When she went on Tikosyn.. she went  off lexapro and trazodone.   GAD, and insomnia: Poor control since being off those meds.  GAD 7: 7  PHQ9: 8   Constipation, acute:  Ongoing x 3 months. Has noted after starting Medtronic stimulator for bladder:  BM every 3-4 days, straining, dry stool  Has tried [employer], helps some.  32 oz of water a day plus tea.       Relevant past medical, surgical, family and social history reviewed and updated as indicated. Interim medical history since our last visit reviewed. Allergies and medications reviewed and updated. Outpatient Medications Prior to Visit  Medication Sig Dispense Refill   Ascorbic Acid (VITAMIN C PO) Take 1 tablet by mouth at bedtime.     atorvastatin (LIPITOR) 10 MG tablet TAKE ONE TABLET BY MOUTH AT BEDTIME 90 tablet 0   calcium carbonate (OS-CAL - DOSED IN MG OF ELEMENTAL CALCIUM) 1250 (500 Ca) MG tablet Take 1 tablet by mouth daily with breakfast.     dofetilide (TIKOSYN) 250 MCG capsule Take 1 capsule (250 mcg total) by mouth 2 (two) times daily. 60 capsule 6   ELIQUIS 5 MG TABS tablet TAKE 1 TABLET BY MOUTH TWICE A DAY 60 tablet 9   hydroxypropyl methylcellulose / hypromellose (ISOPTO TEARS / GONIOVISC) 2.5 % ophthalmic solution Place 1 drop into both eyes 2 (two) times daily as needed for dry eyes.     linagliptin (TRADJENTA) 5 MG TABS tablet Take 1 tablet (5 mg total) by mouth daily. 90  tablet 3   magnesium oxide (MAG-OX) 400 MG tablet Take 1 tablet (400 mg total) by mouth 2 (two) times daily. 60 tablet 6   metFORMIN (GLUCOPHAGE-XR) 500 MG 24 hr tablet Take 1 tablet (500 mg total) by mouth in the morning and at bedtime. 180 tablet 0   metoprolol succinate (TOPROL-XL) 50 MG 24 hr tablet Take one tablet by mouth at bedtime 90 tablet 3   MYRBETRIQ 50 MG TB24 tablet Take 50 mg by mouth daily.     ONETOUCH ULTRA test strip CHECK BLOOD SUGAR 2 TIMES A DAY AS DIRECTED DX CODE E11.9 100 strip 7   No facility-administered medications prior to visit.     Per HPI unless specifically indicated in ROS section below Review of Systems  Constitutional:  Negative for fatigue and fever.  HENT:  Negative for congestion.   Eyes:  Negative for pain.  Respiratory:  Negative for cough and shortness of breath.   Cardiovascular:  Negative for chest pain, palpitations and leg swelling.  Gastrointestinal:  Negative for abdominal pain.  Genitourinary:  Negative for dysuria and vaginal bleeding.  Musculoskeletal:  Negative for back pain.  Neurological:  Negative for syncope, light-headedness and headaches.  Psychiatric/Behavioral:  Negative for dysphoric mood.    Objective:  BP (!) 140/62   Pulse 67   Temp 97.6 F (36.4 C) (Oral)   Ht '5\' 5"'$  (1.651 m)   Wt 176 lb 6 oz (80 kg)   LMP  (LMP Unknown)   SpO2 98%   BMI 29.35 kg/m   Wt Readings from Last 3 Encounters:  10/26/21 176 lb 6 oz (80 kg)  10/08/21 177 lb 6 oz (80.5 kg)  08/31/21 174 lb (78.9 kg)      Physical Exam Constitutional:      General: She is not in acute distress.    Appearance: Normal appearance. She is well-developed. She is not ill-appearing or toxic-appearing.  HENT:     Head: Normocephalic.     Right Ear: Hearing, tympanic membrane, ear canal and external ear normal. Tympanic membrane is not erythematous, retracted or bulging.     Left Ear: Hearing, tympanic membrane, ear canal and external ear normal. Tympanic  membrane is not erythematous, retracted or bulging.     Nose: No mucosal edema or rhinorrhea.     Right Sinus: No maxillary sinus tenderness or frontal sinus tenderness.     Left Sinus: No maxillary sinus tenderness or frontal sinus tenderness.     Mouth/Throat:     Pharynx: Uvula midline.  Eyes:     General: Lids are normal. Lids are everted, no foreign bodies appreciated.     Conjunctiva/sclera: Conjunctivae normal.     Pupils: Pupils are equal, round, and reactive to light.  Neck:     Thyroid: No thyroid mass or thyromegaly.     Vascular: No carotid bruit.     Trachea: Trachea normal.  Cardiovascular:     Rate and Rhythm: Normal rate. Rhythm irregularly irregular.     Pulses: Normal pulses.     Heart sounds: Normal heart sounds, S1 normal and S2 normal. No murmur heard.    No friction rub. No gallop.  Pulmonary:     Effort: Pulmonary effort is normal. No tachypnea or respiratory distress.     Breath sounds: Normal breath sounds. No decreased breath sounds, wheezing, rhonchi or rales.  Abdominal:     General: Bowel sounds are normal.     Palpations: Abdomen is soft.     Tenderness: There is no abdominal tenderness.  Musculoskeletal:     Cervical back: Normal range of motion and neck supple.  Skin:    General: Skin is warm and dry.     Findings: No rash.  Neurological:     Mental Status: She is alert.  Psychiatric:        Mood and Affect: Mood is not anxious or depressed.        Speech: Speech normal.        Behavior: Behavior normal. Behavior is cooperative.        Thought Content: Thought content normal.        Judgment: Judgment normal.       Results for orders placed or performed in visit on 10/24/21  Vitamin B12  Result Value Ref Range   Vitamin B-12 387 211 - 911 pg/mL  IBC + Ferritin  Result Value Ref Range   Iron 54 42 - 145 ug/dL   Transferrin 315.0 212.0 - 360.0 mg/dL   Saturation Ratios 12.2 (L) 20.0 - 50.0 %   Ferritin 8.0 (L) 10.0 - 291.0 ng/mL   TIBC  441.0 250.0 - 450.0 mcg/dL  VITAMIN D 25 Hydroxy (Vit-D Deficiency, Fractures)  Result Value Ref Range   VITD 50.15 30.00 - 100.00 ng/mL  CBC with Differential/Platelet  Result Value Ref Range   WBC 5.6 4.0 - 10.5 K/uL   RBC 3.67 (L) 3.87 - 5.11 Mil/uL   Hemoglobin 10.6 (L) 12.0 - 15.0 g/dL   HCT 31.6 (L) 36.0 - 46.0 %   MCV 86.3 78.0 - 100.0 fl   MCHC 33.4 30.0 - 36.0 g/dL   RDW 14.0 11.5 - 15.5 %   Platelets 333.0 150.0 - 400.0 K/uL   Neutrophils Relative % 62.2 43.0 - 77.0 %   Lymphocytes Relative 30.0 12.0 - 46.0 %   Monocytes Relative 6.3 3.0 - 12.0 %   Eosinophils Relative 1.1 0.0 - 5.0 %   Basophils Relative 0.4 0.0 - 3.0 %   Neutro Abs 3.5 1.4 - 7.7 K/uL   Lymphs Abs 1.7 0.7 - 4.0 K/uL   Monocytes Absolute 0.4 0.1 - 1.0 K/uL   Eosinophils Absolute 0.1 0.0 - 0.7 K/uL   Basophils Absolute 0.0 0.0 - 0.1 K/uL  Comprehensive metabolic panel  Result Value Ref Range   Sodium 136 135 - 145 mEq/L   Potassium 4.6 3.5 - 5.1 mEq/L   Chloride 100 96 - 112 mEq/L   CO2 29 19 - 32 mEq/L   Glucose, Bld 143 (H) 70 - 99 mg/dL   BUN 13 6 - 23 mg/dL   Creatinine, Ser 1.05 0.40 - 1.20 mg/dL   Total Bilirubin 0.3 0.2 - 1.2 mg/dL   Alkaline Phosphatase 61 39 - 117 U/L   AST 12 0 - 37 U/L   ALT 11 0 - 35 U/L   Total Protein 6.2 6.0 - 8.3 g/dL   Albumin 3.9 3.5 - 5.2 g/dL   GFR 52.31 (L) >60.00 mL/min   Calcium 9.3 8.4 - 10.5 mg/dL  Lipid panel  Result Value Ref Range   Cholesterol 147 0 - 200 mg/dL   Triglycerides 79.0 0.0 - 149.0 mg/dL   HDL 57.70 >39.00 mg/dL   VLDL 15.8 0.0 - 40.0 mg/dL   LDL Cholesterol 73 0 - 99 mg/dL   Total CHOL/HDL Ratio 3    NonHDL 89.01   Hemoglobin A1c  Result Value Ref Range   Hgb A1c MFr Bld 8.0 (H) 4.6 - 6.5 %     COVID 19 screen:  No recent travel or known exposure to COVID19 The patient denies respiratory symptoms of COVID 19 at this time. The importance of social distancing was discussed today.   Assessment and Plan Problem List Items  Addressed This Visit     Acute constipation - Primary    She feels certain the symptoms started after she had the Medtronic stimulator for her bladder placed.  Her urologist agrees that it could be a side effect. She has had recent labs that are unremarkable, but if constipation not improving we could consider checking thyroid as well.  She will start by increasing water, fiber in  diet and supplement. She will increase physical activity. Start MiraLAX 17 g p.o. daily.      Chronic insomnia    Chronic, inadequate control We will treat anxiety and depression first.  We will hold off on sleep medication at this time but reviewed healthy sleep hygiene.       GAD (generalized anxiety disorder)    Chronic, poor control Contributing to insomnia  We will try a trial of low-dose sertraline 25 mg p.o. nightly given Lexapro has interaction with Tikosyn.  She will follow-up in  4 weeks      MDD (major depressive disorder), recurrent episode, mild (HCC)   Persistent atrial fibrillation (HCC)    On Tikosyn, but this may change as it does not seem to be helping with her atrial fibrillation per cardiology notes.  Considering ablation versus other treatment.  Need to avoid several different antidepressants/sleep medications due to interaction with Tikosyn      Meds ordered this encounter  Medications   sertraline (ZOLOFT) 25 MG tablet    Sig: Take 1 tablet (25 mg total) by mouth daily.    Dispense:  30 tablet    Refill:  3       Eliezer Lofts, MD

## 2021-10-26 NOTE — Assessment & Plan Note (Signed)
On Tikosyn, but this may change as it does not seem to be helping with her atrial fibrillation per cardiology notes.  Considering ablation versus other treatment.  Need to avoid several different antidepressants/sleep medications due to interaction with Tikosyn

## 2021-10-26 NOTE — Assessment & Plan Note (Signed)
She feels certain the symptoms started after she had the Medtronic stimulator for her bladder placed.  Her urologist agrees that it could be a side effect. She has had recent labs that are unremarkable, but if constipation not improving we could consider checking thyroid as well.  She will start by increasing water, fiber in  diet and supplement. She will increase physical activity. Start MiraLAX 17 g p.o. daily.

## 2021-10-26 NOTE — Assessment & Plan Note (Signed)
Chronic, poor control Contributing to insomnia  We will try a trial of low-dose sertraline 25 mg p.o. nightly given Lexapro has interaction with Tikosyn.  She will follow-up in 4 weeks

## 2021-10-26 NOTE — Assessment & Plan Note (Signed)
Chronic, inadequate control We will treat anxiety and depression first.  We will hold off on sleep medication at this time but reviewed healthy sleep hygiene.

## 2021-10-29 DIAGNOSIS — N3946 Mixed incontinence: Secondary | ICD-10-CM | POA: Diagnosis not present

## 2021-10-29 DIAGNOSIS — N3281 Overactive bladder: Secondary | ICD-10-CM | POA: Diagnosis not present

## 2021-11-01 ENCOUNTER — Encounter: Payer: Self-pay | Admitting: Family Medicine

## 2021-11-01 ENCOUNTER — Ambulatory Visit (INDEPENDENT_AMBULATORY_CARE_PROVIDER_SITE_OTHER): Payer: Medicare HMO | Admitting: Family Medicine

## 2021-11-01 VITALS — BP 124/60 | HR 74 | Temp 98.5°F | Ht 64.75 in | Wt 180.1 lb

## 2021-11-01 DIAGNOSIS — I4819 Other persistent atrial fibrillation: Secondary | ICD-10-CM

## 2021-11-01 DIAGNOSIS — R69 Illness, unspecified: Secondary | ICD-10-CM | POA: Diagnosis not present

## 2021-11-01 DIAGNOSIS — E559 Vitamin D deficiency, unspecified: Secondary | ICD-10-CM | POA: Diagnosis not present

## 2021-11-01 DIAGNOSIS — I152 Hypertension secondary to endocrine disorders: Secondary | ICD-10-CM

## 2021-11-01 DIAGNOSIS — E1169 Type 2 diabetes mellitus with other specified complication: Secondary | ICD-10-CM | POA: Diagnosis not present

## 2021-11-01 DIAGNOSIS — E785 Hyperlipidemia, unspecified: Secondary | ICD-10-CM

## 2021-11-01 DIAGNOSIS — E1159 Type 2 diabetes mellitus with other circulatory complications: Secondary | ICD-10-CM

## 2021-11-01 DIAGNOSIS — E119 Type 2 diabetes mellitus without complications: Secondary | ICD-10-CM

## 2021-11-01 DIAGNOSIS — F330 Major depressive disorder, recurrent, mild: Secondary | ICD-10-CM | POA: Diagnosis not present

## 2021-11-01 DIAGNOSIS — F411 Generalized anxiety disorder: Secondary | ICD-10-CM

## 2021-11-01 DIAGNOSIS — Z0000 Encounter for general adult medical examination without abnormal findings: Secondary | ICD-10-CM | POA: Diagnosis not present

## 2021-11-01 MED ORDER — SERTRALINE HCL 50 MG PO TABS: 50.0000 mg | ORAL_TABLET | Freq: Every day | ORAL | 5 refills | Status: DC

## 2021-11-01 NOTE — Assessment & Plan Note (Signed)
Chronic, rate controlled on Tikosyn and metoprolol.  On Eliquis anticoagulation.

## 2021-11-01 NOTE — Patient Instructions (Addendum)
Increase Miralax to 17 gm twice daily until regular BMs. Keep up with water and fiber intake.  Increase sertraline to 50 mg daily  for mood.  Continue  current diabetes medication.  Add back exercise as tolerated, stress reduction.  Increase iron in diet.[name] we can recheck the iron and hemoglobin in 3 months

## 2021-11-01 NOTE — Assessment & Plan Note (Signed)
Chronic, previously well controlled on Tradjenta.  She is followed by endocrinology

## 2021-11-01 NOTE — Assessment & Plan Note (Signed)
Chronic, now on sertraline low-dose for the last few days.  She denies side effects to this medication.  Discussed that it would take longer for her to see a result.

## 2021-11-01 NOTE — Assessment & Plan Note (Signed)
Chronic, resolved on supplementation.

## 2021-11-01 NOTE — Assessment & Plan Note (Signed)
Chronic, blood pressure well controlled on metoprolol 50 mg XL p.o. daily

## 2021-11-01 NOTE — Assessment & Plan Note (Signed)
LDL at goal less than 70 on atorvastatin 10 mg p.o. daily.

## 2021-11-01 NOTE — Progress Notes (Signed)
Patient ID: [name], female    DOB: 12-23-1945, 76 y.o.   MRN: [id number]  This visit was conducted in person.  Pulse 74   Temp 98.5 F (36.9 C) (Oral)   Ht 5' 4.75" (1.645 m)   Wt 180 lb 2 oz (81.7 kg)   LMP  (LMP Unknown)   SpO2 97%   BMI 30.21 kg/m    CC:  Chief Complaint  Patient presents with   Annual Exam    Part 2    Subjective:   HPI: [name] is a 76 y.o. female presenting on 11/01/2021 for Annual Exam (Part 2)  The patient presents for  complete physical and review of chronic health problems. He/She also has the following acute concerns today: Recently seen for acute constipation on October 26, 2021..  Treated with fiber, water and MiraLAX.  Now having BM ever 2 days.  Also recently seen for worsening generalized anxiety.  Started on low-dose sertraline nightly given she had to stop Lexapro given the interaction with Tikosyn she is on for atrial fibrillation    The patient saw a LPN or RN for medicare wellness visit.  Prevention and wellness was reviewed in detail. Note reviewed and important notes copied below.  Hypertension:    Well controlled. BP Readings from Last 3 Encounters:  11/01/21 124/60  10/26/21 (!) 140/62  10/08/21 (!) 140/62  Using medication without problems or lightheadedness:  none Chest pain with exertion: none Edema:none Short of breath: none Average home BPs: Other issues:  Diabetes: Poor control despite metformin and Tradjenta.. she feels stress has increased since having to stop her lexapro starting tikosyn  Lab Results  Component Value Date   HGBA1C 8.0 (H) 10/24/2021  Using medications without difficulties: Hypoglycemic episodes:none Hyperglycemic episodes: none Feet problems:none Blood Sugars averaging: FBS 128-130 eye exam within last year: due  Elevated Cholesterol: LDL at goal less than 70 on atorvastatin 10 mg p.o. daily. Lab Results  Component Value Date   CHOL 147 10/24/2021   HDL 57.70 10/24/2021    LDLCALC 73 10/24/2021   LDLDIRECT 163.1 07/04/2011   TRIG 79.0 10/24/2021   CHOLHDL 3 10/24/2021  Using medications without problems: none Muscle aches:  none Diet compliance:  good Exercise: minimal...  Other complaints:   Atrial fibrillation, followed by cardiology  Vitamin D deficiency normal range  Iron deficiency anemia, most recent hemoglobin at 10.6.Marland KitchenMarland Kitchen Stored iron ferritin low at 8.  No blood in stool or urine, no nosebleeds, no surgery.  Relevant past medical, surgical, family and social history reviewed and updated as indicated. Interim medical history since our last visit reviewed. Allergies and medications reviewed and updated. Outpatient Medications Prior to Visit  Medication Sig Dispense Refill   Ascorbic Acid (VITAMIN C PO) Take 1 tablet by mouth at bedtime.     atorvastatin (LIPITOR) 10 MG tablet TAKE ONE TABLET BY MOUTH AT BEDTIME 90 tablet 0   calcium carbonate (OS-CAL - DOSED IN MG OF ELEMENTAL CALCIUM) 1250 (500 Ca) MG tablet Take 1 tablet by mouth daily with breakfast.     dofetilide (TIKOSYN) 250 MCG capsule Take 1 capsule (250 mcg total) by mouth 2 (two) times daily. 60 capsule 6   ELIQUIS 5 MG TABS tablet TAKE 1 TABLET BY MOUTH TWICE A DAY 60 tablet 9   hydroxypropyl methylcellulose / hypromellose (ISOPTO TEARS / GONIOVISC) 2.5 % ophthalmic solution Place 1 drop into both eyes 2 (two) times daily as needed for dry eyes.  linagliptin (TRADJENTA) 5 MG TABS tablet Take 1 tablet (5 mg total) by mouth daily. 90 tablet 3   magnesium oxide (MAG-OX) 400 MG tablet Take 1 tablet (400 mg total) by mouth 2 (two) times daily. 60 tablet 6   metFORMIN (GLUCOPHAGE-XR) 500 MG 24 hr tablet Take 1 tablet (500 mg total) by mouth in the morning and at bedtime. 180 tablet 0   metoprolol succinate (TOPROL-XL) 50 MG 24 hr tablet Take one tablet by mouth at bedtime 90 tablet 3   MYRBETRIQ 50 MG TB24 tablet Take 50 mg by mouth daily.     ONETOUCH ULTRA test strip CHECK BLOOD SUGAR  2 TIMES A DAY AS DIRECTED DX CODE E11.9 100 strip 7   sertraline (ZOLOFT) 25 MG tablet Take 1 tablet (25 mg total) by mouth daily. 30 tablet 3   No facility-administered medications prior to visit.     Per HPI unless specifically indicated in ROS section below Review of Systems  Constitutional:  Negative for fatigue and fever.  HENT:  Negative for congestion.   Eyes:  Negative for pain.  Respiratory:  Negative for cough and shortness of breath.   Cardiovascular:  Negative for chest pain, palpitations and leg swelling.  Gastrointestinal:  Negative for abdominal pain.  Genitourinary:  Negative for dysuria and vaginal bleeding.  Musculoskeletal:  Negative for back pain.  Neurological:  Negative for syncope, light-headedness and headaches.  Psychiatric/Behavioral:  Negative for dysphoric mood.    Objective:  Pulse 74   Temp 98.5 F (36.9 C) (Oral)   Ht 5' 4.75" (1.645 m)   Wt 180 lb 2 oz (81.7 kg)   LMP  (LMP Unknown)   SpO2 97%   BMI 30.21 kg/m   Wt Readings from Last 3 Encounters:  11/01/21 180 lb 2 oz (81.7 kg)  10/26/21 176 lb 6 oz (80 kg)  10/08/21 177 lb 6 oz (80.5 kg)      Physical Exam Constitutional:      General: She is not in acute distress.    Appearance: Normal appearance. She is well-developed. She is not ill-appearing or toxic-appearing.  HENT:     Head: Normocephalic.     Right Ear: Hearing, tympanic membrane, ear canal and external ear normal. Tympanic membrane is not erythematous, retracted or bulging.     Left Ear: Hearing, tympanic membrane, ear canal and external ear normal. Tympanic membrane is not erythematous, retracted or bulging.     Nose: No mucosal edema or rhinorrhea.     Right Sinus: No maxillary sinus tenderness or frontal sinus tenderness.     Left Sinus: No maxillary sinus tenderness or frontal sinus tenderness.     Mouth/Throat:     Pharynx: Uvula midline.  Eyes:     General: Lids are normal. Lids are everted, no foreign bodies  appreciated.     Conjunctiva/sclera: Conjunctivae normal.     Pupils: Pupils are equal, round, and reactive to light.  Neck:     Thyroid: No thyroid mass or thyromegaly.     Vascular: No carotid bruit.     Trachea: Trachea normal.  Cardiovascular:     Rate and Rhythm: Normal rate. Rhythm irregularly irregular.     Pulses: Normal pulses.     Heart sounds: Normal heart sounds, S1 normal and S2 normal. No murmur heard.    No friction rub. No gallop.  Pulmonary:     Effort: Pulmonary effort is normal. No tachypnea or respiratory distress.     Breath sounds: Normal  breath sounds. No decreased breath sounds, wheezing, rhonchi or rales.  Abdominal:     General: Bowel sounds are normal.     Palpations: Abdomen is soft.     Tenderness: There is no abdominal tenderness.  Musculoskeletal:     Cervical back: Normal range of motion and neck supple.  Skin:    General: Skin is warm and dry.     Findings: No rash.  Neurological:     Mental Status: She is alert.  Psychiatric:        Mood and Affect: Mood is not anxious or depressed.        Speech: Speech normal.        Behavior: Behavior normal. Behavior is cooperative.        Thought Content: Thought content normal.        Judgment: Judgment normal.       Diabetic foot exam: Normal inspection No skin breakdown No calluses  Normal DP pulses Normal sensation to light touch and monofilament Nails normal  Results for orders placed or performed in visit on 10/24/21  Vitamin B12  Result Value Ref Range   Vitamin B-12 387 211 - 911 pg/mL  IBC + Ferritin  Result Value Ref Range   Iron 54 42 - 145 ug/dL   Transferrin 315.0 212.0 - 360.0 mg/dL   Saturation Ratios 12.2 (L) 20.0 - 50.0 %   Ferritin 8.0 (L) 10.0 - 291.0 ng/mL   TIBC 441.0 250.0 - 450.0 mcg/dL  VITAMIN D 25 Hydroxy (Vit-D Deficiency, Fractures)  Result Value Ref Range   VITD 50.15 30.00 - 100.00 ng/mL  CBC with Differential/Platelet  Result Value Ref Range   WBC 5.6 4.0  - 10.5 K/uL   RBC 3.67 (L) 3.87 - 5.11 Mil/uL   Hemoglobin 10.6 (L) 12.0 - 15.0 g/dL   HCT 31.6 (L) 36.0 - 46.0 %   MCV 86.3 78.0 - 100.0 fl   MCHC 33.4 30.0 - 36.0 g/dL   RDW 14.0 11.5 - 15.5 %   Platelets 333.0 150.0 - 400.0 K/uL   Neutrophils Relative % 62.2 43.0 - 77.0 %   Lymphocytes Relative 30.0 12.0 - 46.0 %   Monocytes Relative 6.3 3.0 - 12.0 %   Eosinophils Relative 1.1 0.0 - 5.0 %   Basophils Relative 0.4 0.0 - 3.0 %   Neutro Abs 3.5 1.4 - 7.7 K/uL   Lymphs Abs 1.7 0.7 - 4.0 K/uL   Monocytes Absolute 0.4 0.1 - 1.0 K/uL   Eosinophils Absolute 0.1 0.0 - 0.7 K/uL   Basophils Absolute 0.0 0.0 - 0.1 K/uL  Comprehensive metabolic panel  Result Value Ref Range   Sodium 136 135 - 145 mEq/L   Potassium 4.6 3.5 - 5.1 mEq/L   Chloride 100 96 - 112 mEq/L   CO2 29 19 - 32 mEq/L   Glucose, Bld 143 (H) 70 - 99 mg/dL   BUN 13 6 - 23 mg/dL   Creatinine, Ser 1.05 0.40 - 1.20 mg/dL   Total Bilirubin 0.3 0.2 - 1.2 mg/dL   Alkaline Phosphatase 61 39 - 117 U/L   AST 12 0 - 37 U/L   ALT 11 0 - 35 U/L   Total Protein 6.2 6.0 - 8.3 g/dL   Albumin 3.9 3.5 - 5.2 g/dL   GFR 52.31 (L) >60.00 mL/min   Calcium 9.3 8.4 - 10.5 mg/dL  Lipid panel  Result Value Ref Range   Cholesterol 147 0 - 200 mg/dL   Triglycerides 79.0 0.0 -  149.0 mg/dL   HDL 57.70 >39.00 mg/dL   VLDL 15.8 0.0 - 40.0 mg/dL   LDL Cholesterol 73 0 - 99 mg/dL   Total CHOL/HDL Ratio 3    NonHDL 89.01   Hemoglobin A1c  Result Value Ref Range   Hgb A1c MFr Bld 8.0 (H) 4.6 - 6.5 %     COVID 19 screen:  No recent travel or known exposure to COVID19 The patient denies respiratory symptoms of COVID 19 at this time. The importance of social distancing was discussed today.   Assessment and Plan   The patient's preventative maintenance and recommended screening tests for an annual wellness exam were reviewed in full today. Brought up to date unless services declined.  Counselled on the importance of diet, exercise, and its  role in overall health and mortality. The patient's FH and SH was reviewed, including their home life, tobacco status, and drug and alcohol status.   DEXA 2022 mild osteopenia.. repeat in 5 years  Mammogram:  nml 09/2021 Hep C: done  Colon: 2019 neg.. no further needed given age.  Partial hysterectomy.[name] asymptomatic.  Discussed COVID19 vaccine side effects and benefits. Strongly encouraged the patient to get the vaccine. Questions answered. Tdap uptodate, consdier shingrix, uptodate with PNA x2  Pending microalbumin.  Problem List Items Addressed This Visit     Controlled diabetes mellitus type II without complication (HCC) (Chronic)    Chronic, previously well controlled on Tradjenta.  She is followed by endocrinology      Hyperlipidemia associated with type 2 diabetes mellitus (HCC) (Chronic)    LDL at goal less than 70 on atorvastatin 10 mg p.o. daily.      Hypertension associated with diabetes ([place]) (Chronic)    Chronic, blood pressure well controlled on metoprolol 50 mg XL p.o. daily      MDD (major depressive disorder), recurrent episode, mild (HCC) (Chronic)    Chronic, now on sertraline low-dose for the last few days.  She denies side effects to this medication.  Discussed that it would take longer for her to see a result.      Relevant Medications   sertraline (ZOLOFT) 50 MG tablet   Persistent atrial fibrillation (HCC) (Chronic)    Chronic, rate controlled on Tikosyn and metoprolol.  On Eliquis anticoagulation.      Vitamin D deficiency (Chronic)    Chronic, resolved on supplementation.      GAD (generalized anxiety disorder)   Relevant Medications   sertraline (ZOLOFT) 50 MG tablet   Other Visit Diagnoses     Routine general medical examination at a health care facility    -  Primary       Eliezer Lofts, MD

## 2021-11-02 ENCOUNTER — Ambulatory Visit (INDEPENDENT_AMBULATORY_CARE_PROVIDER_SITE_OTHER): Payer: Medicare HMO

## 2021-11-02 DIAGNOSIS — I4819 Other persistent atrial fibrillation: Secondary | ICD-10-CM | POA: Diagnosis not present

## 2021-11-02 DIAGNOSIS — Z01810 Encounter for preprocedural cardiovascular examination: Secondary | ICD-10-CM

## 2021-11-04 NOTE — Progress Notes (Signed)
Carelink Summary Report / Loop Recorder 

## 2021-11-05 LAB — CUP PACEART REMOTE DEVICE CHECK
Date Time Interrogation Session: 20230901124825
Implantable Pulse Generator Implant Date: 20210816

## 2021-11-06 ENCOUNTER — Other Ambulatory Visit: Payer: Medicare HMO

## 2021-11-06 ENCOUNTER — Telehealth: Payer: Self-pay

## 2021-11-06 DIAGNOSIS — E119 Type 2 diabetes mellitus without complications: Secondary | ICD-10-CM

## 2021-11-06 LAB — MICROALBUMIN / CREATININE URINE RATIO
Creatinine,U: 28.1 mg/dL
Microalb Creat Ratio: 2.5 mg/g (ref 0.0–30.0)
Microalb, Ur: <0.7 mg/dL (ref 0.0–1.9)

## 2021-11-06 NOTE — Progress Notes (Signed)
No critical labs need to be addressed urgently. We will discuss labs in detail at upcoming office visit.   

## 2021-11-06 NOTE — Telephone Encounter (Signed)
Please see most recent ILR report.  Pt with increase in palpitations noted at last OV and metoprolol increased.  Pt has appointment with Christell Faith 11/07/2021 to follow up on reports of chest pain.  ILR summary report received. Battery status OK. Normal device function. No new symptom, brady, or pause episodes. There were 30 new AF episodes, on OAC, AF burden is 7.8% of the time.  There were 270 AT episodes and 4 tachycardia events.  Sent to triage due to tachycardia.

## 2021-11-06 NOTE — Progress Notes (Unsigned)
Cardiology Office Note    Date:  11/07/2021   ID:  [name], DOB 1946-02-22, MRN [id number]  PCP:  Jinny Sanders, MD  Cardiologist:  Kathlyn Sacramento, MD  Electrophysiologist:  Thompson Grayer, MD   Chief Complaint: Follow-up  History of Present Illness:   [name] is a 76 y.o. female with history of persistent A. fib on Eliquis status post cardioversion in 01/2016, 05/2016, and 11/2016 status post radiofrequency catheter ablation on 12/17/2016 s/p repeat DCCV 09/07/2019 followed by EP/A. fib clinic, atrial tachycardia s/p DCCV 09/18/2018, pulmonary hypertension, mitral valve prolapse with mitral regurgitation, DM2, hypertension, hyperlipidemia, hypothyroidism, anemia, obesity, OSA on CPAP, diverticulosis, GERD, and anxiety who presents for evaluation of intermittent chest pain.  Prior cardiac cath in 12/2008 for atypical chest pain showed no significant CAD with mildly elevated LVEDP and normal EF.  She was initially diagnosed with A. fib in 09/2013 and was quite symptomatic with this.  Echo at that time showed normal LV systolic function with mild mitral regurgitation, moderately dilated left atrium, and mild to moderate pulmonary hypertension.  Given it was felt she would likely need antiarrhythmic therapy due to symptoms, nuclear stress test in 09/2013 was undertaken and was normal.  With recurrence of A. fib, she was subsequently placed on flecainide and underwent cardioversion in 01/2016.  She had recurrent A. fib in 05/2016 and underwent repeat successful cardioversion at that time.  She was referred to the A. fib clinic.  She had recurrent A. fib in 11/2016 and underwent a third cardioversion.  She then underwent radiofrequency catheter ablation on 12/17/2016.  Coronary CTA/morphology at that time showed a calcium score of 0.  She was seen on 09/15/2018 noting a 4 to 5-day history of mild palpitations that felt different than her prior episodes of A. fib along with some brief episodes of  dizziness.  After review with EP, she was noted to be in atrial tachycardia with long cycle Mobitz type I AV block. Her flecainide was increased to 50 mg bid and she underwent successful DCCV on 09/18/2018.  In follow up with the Afib clinic in 04/2019, she was maintaining sinus rhythm.  Echo in 04/2019 showed an EF of 55 to 60%, mildly dilated left ventricular cavity, no regional wall motion abnormalities, normal RV systolic function and ventricular cavity size, severely dilated left atrium measuring 55 mm, mild mitral regurgitation, mild aortic valve sclerosis without evidence of stenosis.  She had recurrent Afib in 07/2019, and was scheduled for DCCV.  She converted to sinus rhythm prior to this being completed.  At that time, her flecainide was titrated to 100 mg bid.  She was seen by Dr. Fletcher Anon on 09/02/2019, and was back in symptomatic Afib with noted dizziness, fatigue, and a syncopal episode.  Her diltiazem was changed to metoprolol and she subsequently underwent successful DCCV on 09/07/2019 to sinus bradycardia.  Her moderate mitral regurgitation was not felt to be driving her Afib.  With her severely dilated left atrium, it was felt it may be difficult to keep her in sinus rhythm.  She was re-referred to EP in this setting.  She was seen in follow up on 09/17/2019 and was doing well, and maintaining sinus rhythm. She was mildly bradycardic with a heart rate of 54 bpm, though was asymptomatic.  BP was 140/60.  She was seen in the office on 10/01/2019 for evaluation of dizziness with possible transient syncope which led to the tapering of her flecainide to 50 mg bid  with improvement in symptoms.  She was seen in follow up by Dr. Rayann Heman on 10/18/2019, and was doing well.  With regards to her Afib burden and symptoms, ILR was implanted.  She was seen in this office in 12/2019 by Dr. Fletcher Anon and was doing well, without persistent Afib episodes noted on ILR.  She was seen by Dr. Rayann Heman in 01/2020 with a < 1% Afib burden  noted on ILR, with many episodes felt to appear to be sinus with PACs rather than actual Afib.  Repeat echo in 04/2020, to trend her mitral regurgitation, showed an EF of 55-60%, no RWMA, mildly dilated LV cavity size, normal RVSF and ventricular cavity size, PASP 49 mmHg, severely dilated left atrium, moderate mitral regurgitation, mild aortic sclerosis without stenosis, and a right atrial pressure of 3 mmHg.  Echo in 04/2020 demonstrated an EF of 55 to 60%, no regional wall motion abnormalities, indeterminate LV diastolic function parameters, normal RV systolic function and ventricular cavity size, PASP 49 mmHg, severely dilated left atrium, moderate mitral regurgitation, mild aortic valve sclerosis without evidence of stenosis, and an estimated right atrial pressure of 3 mmHg.  Since I last saw her, she has been followed by EP and the A-fib clinic.  ILR interrogations have demonstrated intermittent A-fib burden.  She underwent outpatient cardiac monitoring in 03/2021 which showed a predominant underlying rhythm of atrial fibrillation with an overall burden of 56%, 3 episodes of SVT with the longest and fastest interval lasting 14.1 seconds, though there was difficulty discerning atrial activity making definitive diagnosis difficult to ascertain.  Most recent ILR interrogation showed a 7.8% burden of A-fib and 270 AT episodes.  She comes in today noting a 2 to 3-week history of intermittent chest discomfort that is more noticeable at nighttime when laying down.  She is uncertain if this is because there are less stimuli around her when laying down in the evening.  No exertional symptoms.  There is some associated shortness of breath and dizziness with her chest discomfort.  She does continue to note palpitations.  There is also an underlying sense of fatigue which began after initiating dofetilide.  She indicates that she would like to come off Tikosyn if possible.  No frank syncope.  No lower extremity swelling.   No falls, hematochezia, or melena.   Labs independently reviewed: 10/2021 - A1c 8.0, TC 147, TG 79, HDL 57, LDL 73, potassium 4.6, BUN 13, serum creatinine 1.05, albumin 3.9, AST/ALT normal, Hgb 10.6, PLT 333 05/2021 - magnesium 2.0 02/2021 - TSH normal  Past Medical History:  Diagnosis Date   Anemia    Anxiety    Atrial fibrillation (HCC)    Colon polyps    Constipation    Depressive disorder, not elsewhere classified    Diabetes mellitus without complication ([place])    Diaphragmatic hernia without mention of obstruction or gangrene    Disorders of bursae and tendons in shoulder region, unspecified    Diverticulosis of colon (without mention of hemorrhage)    External hemorrhoid    Fundic gland polyps of stomach, benign    GERD (gastroesophageal reflux disease)    History of hiatal hernia    HTN (hypertension)    Hypertension    Hypothyroidism    Internal hemorrhoids without mention of complication    Moderate mitral regurgitation    Obesity, unspecified    OSA on CPAP    compliant with CPAP   Other urinary incontinence    Persistent atrial fibrillation ([place])  Pure hypercholesterolemia    Type II diabetes mellitus ([place])    Urinary frequency    Urinary urgency    Vitamin B 12 deficiency     Past Surgical History:  Procedure Laterality Date   ABDOMINAL HYSTERECTOMY     ABDOMINAL HYSTERECTOMY  1972   Have both ovaries   ATRIAL FIBRILLATION ABLATION  12/17/2016   ATRIAL FIBRILLATION ABLATION N/A 12/17/2016   Procedure: ATRIAL FIBRILLATION ABLATION;  Surgeon: Thompson Grayer, MD;  Location: [facility];  Service: Cardiovascular;  Laterality: N/A;   [place]   [facility]; "Dr. [name]"   CARDIOVERSION N/A 05/27/2016   Procedure: CARDIOVERSION;  Surgeon: [name], MD;  Location: [facility];  Service: Cardiovascular;  Laterality: N/A;   CARDIOVERSION N/A 11/25/2016   Procedure: CARDIOVERSION;  Surgeon: [name], MD;  Location: [facility];   Service: Cardiovascular;  Laterality: N/A;   CARDIOVERSION N/A 09/18/2018   Procedure: CARDIOVERSION;  Surgeon: [name], MD;  Location: [facility];  Service: Cardiovascular;  Laterality: N/A;   CARDIOVERSION N/A 07/28/2019   Procedure: CARDIOVERSION;  Surgeon: Kate Sable, MD;  Location: [facility];  Service: Cardiovascular;  Laterality: N/A;   CARDIOVERSION N/A 09/07/2019   Procedure: CARDIOVERSION;  Surgeon: Nelva Bush, MD;  Location: [facility];  Service: Cardiovascular;  Laterality: N/A;   CATARACT EXTRACTION W/ INTRAOCULAR LENS  IMPLANT, BILATERAL Bilateral    COLONOSCOPY     ELECTROPHYSIOLOGIC STUDY N/A 01/24/2016   Procedure: CARDIOVERSION;  Surgeon: [name], MD;  Location: [facility];  Service: Cardiovascular;  Laterality: N/A;   implantable loop recorder implantation  10/18/2019   [employer] Reveal [place] model [id number]) implantable loop recorder    LAPAROSCOPIC CHOLECYSTECTOMY     REDUCTION MAMMAPLASTY Bilateral    TONSILLECTOMY     TOTAL HIP ARTHROPLASTY Right 05/12/2018   Procedure: TOTAL HIP ARTHROPLASTY ANTERIOR APPROACH;  Surgeon: Paralee Cancel, MD;  Location: [facility];  Service: Orthopedics;  Laterality: Right;  70 mins    Current Medications: Current Meds  Medication Sig   Ascorbic Acid (VITAMIN C PO) Take 1 tablet by mouth at bedtime.   atorvastatin (LIPITOR) 10 MG tablet TAKE ONE TABLET BY MOUTH AT BEDTIME   calcium carbonate (OS-CAL - DOSED IN MG OF ELEMENTAL CALCIUM) 1250 (500 Ca) MG tablet Take 1 tablet by mouth daily with breakfast.   dofetilide (TIKOSYN) 250 MCG capsule Take 1 capsule (250 mcg total) by mouth 2 (two) times daily.   ELIQUIS 5 MG TABS tablet TAKE 1 TABLET BY MOUTH TWICE A DAY   hydroxypropyl methylcellulose / hypromellose (ISOPTO TEARS / GONIOVISC) 2.5 % ophthalmic solution Place 1 drop into both eyes 2 (two) times daily as needed for dry eyes.   linagliptin (TRADJENTA) 5 MG TABS tablet Take 1 tablet (5 mg total) by mouth  daily.   magnesium oxide (MAG-OX) 400 MG tablet Take 1 tablet (400 mg total) by mouth 2 (two) times daily.   metFORMIN (GLUCOPHAGE-XR) 500 MG 24 hr tablet Take 1 tablet (500 mg total) by mouth in the morning and at bedtime.   metoprolol succinate (TOPROL-XL) 50 MG 24 hr tablet Take one tablet by mouth at bedtime   MYRBETRIQ 50 MG TB24 tablet Take 50 mg by mouth daily.   ONETOUCH ULTRA test strip CHECK BLOOD SUGAR 2 TIMES A DAY AS DIRECTED DX CODE E11.9   sertraline (ZOLOFT) 50 MG tablet Take 1 tablet (50 mg total) by mouth daily.    Allergies:   Cortisone, Pravachol [pravastatin  sodium], and Contrast media [iodinated contrast media]   Social History   Socioeconomic History   Marital status: Married    Spouse name: Jimmy   Number of children: 2   Years of education: Not on file   Highest education level: Not on file  Occupational History   Occupation: retired  Tobacco Use   Smoking status: Never   Smokeless tobacco: Never   Tobacco comments:    Never smoke 08/30/21  Vaping Use   Vaping Use: Never used  Substance and Sexual Activity   Alcohol use: No    Alcohol/week: 0.0 standard drinks of alcohol   Drug use: No   Sexual activity: Yes  Other Topics Concern   Not on file  Social History Narrative   ** Merged History Encounter **       Exercise walks 3 times daily Lives with spouse in [place] - retired from employment 1 son and 1 daughter  Diet fruit, occ fast food, salads and lean meat  3 tea/day, no EtOH, no tobacco, no drugs    Social Determinants of Health   Financial Resource Strain: Low Risk  (08/31/2021)   Overall Financial Resource Strain (CARDIA)    Difficulty of Paying Living Expenses: Not hard at all  Food Insecurity: No Food Insecurity (08/31/2021)   Hunger Vital Sign    Worried About Running Out of Food in the Last Year: Never true    [place] in the Last Year: Never true  Transportation Needs: No Transportation Needs (08/31/2021)    PRAPARE - Hydrologist (Medical): No    Lack of Transportation (Non-Medical): No  Physical Activity: Unknown (08/31/2021)   Exercise Vital Sign    Days of Exercise per Week: 3 days    Minutes of Exercise per Session: Not on file  Stress: No Stress Concern Present (08/31/2021)   [place]    Feeling of Stress : Only a little  Social Connections: Socially Integrated (08/31/2021)   Social Connection and Isolation Panel [NHANES]    Frequency of Communication with Friends and Family: More than three times a week    Frequency of Social Gatherings with Friends and Family: More than three times a week    Attends Religious Services: More than 4 times per year    Active Member of [employer] or Organizations: Yes    Attends Music therapist: More than 4 times per year    Marital Status: Married     Family History:  The patient's family history includes Cancer in her brother and mother; Dementia in her maternal aunt and mother; Diabetes in her brother and mother; Hyperlipidemia in her father and mother; Hypertension in her sister; Kidney cancer in her son; Lung cancer in her father; Pulmonary embolism in her mother; Stroke in her paternal grandfather and paternal grandmother. There is no history of Colon cancer or Esophageal cancer.  ROS:   12-point review of systems is negative unless otherwise noted in the HPI.   EKGs/Labs/Other Studies Reviewed:    Studies reviewed were summarized above. The additional studies were reviewed today:  2D echo 04/15/2019: 1. Normal LV systolic function; mild LVE; mild eccentric MR; severe LAE.   2. Left ventricular ejection fraction, by estimation, is 55 to 60%. The  left ventricle has normal function. The left ventrical has no regional  wall motion abnormalities. The left ventricular internal cavity size was  mildly dilated. Left ventricular  diastolic  parameters are indeterminate.   3. Right ventricular systolic function is normal. The right ventricular  size is normal.   4. Left atrial size was severely dilated.   5. The mitral valve is normal in structure and function. Mild mitral  valve regurgitation. No evidence of mitral stenosis.   6. The aortic valve is tricuspid. Aortic valve regurgitation is not  visualized. Mild aortic valve sclerosis is present, with no evidence of  aortic valve stenosis.   7. The inferior vena cava is normal in size with greater than 50%  respiratory variability, suggesting right atrial pressure of 3 mmHg. __________   2D echo 04/25/2020: 1. Left ventricular ejection fraction, by estimation, is 55 to 60%. The  left ventricle has normal function. The left ventricle has no regional  wall motion abnormalities. The left ventricular internal cavity size was  mildly dilated. Left ventricular  diastolic parameters are indeterminate.   2. Right ventricular systolic function is normal. The right ventricular  size is normal. There is moderately elevated pulmonary artery systolic  pressure. The estimated right ventricular systolic pressure is 35.7 mmHg.   3. Left atrial size was severely dilated.   4. The mitral valve is normal in structure. Moderate mitral valve  regurgitation. No evidence of mitral stenosis.   5. The aortic valve is normal in structure. Aortic valve regurgitation is  not visualized. Mild aortic valve sclerosis is present, with no evidence  of aortic valve stenosis.   6. The inferior vena cava is normal in size with greater than 50%  respiratory variability, suggesting right atrial pressure of 3 mmHg.   Comparison(s): Prior Echo showed LV EF 55-60%, severe LAE with eccentric  MR.  __________  Elwyn Reach patch 03/2021: Patient had a min HR of 45 bpm, max HR of 166 bpm, and avg HR of 93 bpm. Predominant underlying rhythm was Atrial Fibrillation. First Degree AV Block was present. 3 Supraventricular  Tachycardia runs occurred, the run with the fastest interval lasting  14.1 secs with a max rate of 146 bpm (avg 126 bpm); the run with the fastest interval was also the longest. Atrial Fibrillation occurred (56% burden), ranging from 56-166 bpm (avg of 119 bpm), the longest lasting 6 days 13 hours with an avg rate of 120  bpm. Atrial Fibrillation was detected within +/- 45 seconds of symptomatic patient event(s). Isolated SVEs were rare (<1.0%), SVE Couplets were rare (<1.0%), and SVE Triplets were rare (<1.0%). Isolated VEs were rare (<1.0%), VE Couplets were rare  (<1.0%), and no VE Triplets were present. Difficulty discerning atrial activity making definitive diagnosis difficult to ascertain.  1. NSR with sinus tachy and sinus bradycardia 2. Periods of SVT up to 145/min at times transitioning to atrial fib vs SVT with block 3. No VT 4. Rare PVC's and occaisional PAC's __________  EKG:  EKG is ordered today.  The EKG ordered today demonstrates sinus rhythm with PACs, low voltage QRS, no acute ST-T changes, QTc stable, consistent with prior tracing  Recent Labs: 02/20/2021: TSH 2.43 05/18/2021: Magnesium 2.0 10/24/2021: ALT 11; BUN 13; Creatinine, Ser 1.05; Hemoglobin 10.6; Platelets 333.0; Potassium 4.6; Sodium 136  Recent Lipid Panel    Component Value Date/Time   CHOL 147 10/24/2021 1129   CHOL 147 11/20/2016 0919   TRIG 79.0 10/24/2021 1129   HDL 57.70 10/24/2021 1129   HDL 47 11/20/2016 0919   CHOLHDL 3 10/24/2021 1129   VLDL 15.8 10/24/2021 1129   LDLCALC 73 10/24/2021 1129   LDLCALC 89  11/20/2016 0919   LDLDIRECT 163.1 07/04/2011 0922    PHYSICAL EXAM:    VS:  BP 126/78   Pulse 65   Ht '5\' 5"'$  (1.651 m)   Wt 178 lb (80.7 kg)   LMP  (LMP Unknown)   SpO2 98%   BMI 29.62 kg/m   BMI: Body mass index is 29.62 kg/m.  Physical Exam Vitals reviewed.  Constitutional:      Appearance: She is well-developed.  HENT:     Head: Normocephalic and atraumatic.  Eyes:      General:        Right eye: No discharge.        Left eye: No discharge.  Neck:     Vascular: No JVD.  Cardiovascular:     Rate and Rhythm: Normal rate and regular rhythm. Occasional Extrasystoles are present.    Pulses:          Posterior tibial pulses are 2+ on the right side and 2+ on the left side.     Heart sounds: S1 normal and S2 normal. Heart sounds not distant. No midsystolic click and no opening snap. Murmur heard.     Systolic murmur is present with a grade of 1/6 at the lower left sternal border.     No friction rub.  Pulmonary:     Effort: Pulmonary effort is normal. No respiratory distress.     Breath sounds: Normal breath sounds. No decreased breath sounds, wheezing or rales.  Chest:     Chest wall: No tenderness.  Abdominal:     General: There is no distension.  Musculoskeletal:     Cervical back: Normal range of motion.     Right lower leg: No edema.     Left lower leg: No edema.  Skin:    General: Skin is warm and dry.     Nails: There is no clubbing.  Neurological:     Mental Status: She is alert and oriented to person, place, and time.  Psychiatric:        Speech: Speech normal.        Behavior: Behavior normal.        Thought Content: Thought content normal.        Judgment: Judgment normal.     Wt Readings from Last 3 Encounters:  11/07/21 178 lb (80.7 kg)  11/01/21 180 lb 2 oz (81.7 kg)  10/26/21 176 lb 6 oz (80 kg)     ASSESSMENT & PLAN:   Chest pain with moderate risk for cardiac etiology: Stress test in 2015 was normal.  Calcium score of 0 and 12/2016 on coronary CTA/morphology in preparation for ablation.  Proceed with Lexiscan MPI to evaluate for high risk ischemia.  Deferred coronary CTA given contrast media allergy.  She remains on apixaban and atorvastatin.  Persistent A-fib: She continues to have intermittent A-fib burden despite Tikosyn use with most recent loop recorder interrogation showing a 7.8% burden of A-fib.  She remains on  dofetilide to 50 mcg twice daily and metoprolol succinate 50 mg daily.  We deferred titration of metoprolol given underlying fatigue and with prior concerns for bradycardia on titrated dose of beta-blocker.  Recommend she follow-up with EP/A-fib clinic for ongoing management.  CHA2DS2-VASc at least 4.  She remains on apixaban 5 mg twice daily without bleeding concerns.  Hemoglobin stable.  Most recent potassium and magnesium at goal.  She remains on magnesium supplement.  Paroxysmal SVT/atrial tachycardia: She remains on metoprolol as above.  Follow-up with  EP as directed.  Fatigue: More pronounced starting Tikosyn.  She would like to come off dofetilide, defer to EP.  CBC with stable hemoglobin.  Follow-up with EP for ongoing discussion of antiarrhythmic.  Mitral regurgitation: Moderate by echo in 04/2020.  Update study after Lexiscan.  HTN: Blood pressure mildly elevated at triage at 140/70.  Repeat blood pressure 126/78.  HLD: LDL 73.  She remains on atorvastatin 10 mg.   Shared Decision Making/Informed Consent{  The risks [chest pain, shortness of breath, cardiac arrhythmias, dizziness, blood pressure fluctuations, myocardial infarction, stroke/transient ischemic attack, nausea, vomiting, allergic reaction, radiation exposure, metallic taste sensation and life-threatening complications (estimated to be 1 in 10,000)], benefits (risk stratification, diagnosing coronary artery disease, treatment guidance) and alternatives of a nuclear stress test were discussed in detail with Ms. Uselton and she agrees to proceed.     Disposition: F/u with Dr. Fletcher Anon or an APP after testing, and EP as directed.   Medication Adjustments/Labs and Tests Ordered: Current medicines are reviewed at length with the patient today.  Concerns regarding medicines are outlined above. Medication changes, Labs and Tests ordered today are summarized above and listed in the Patient Instructions accessible in Encounters.    Signed, Christell Faith, PA-C 11/07/2021 12:17 PM     [address], [place] [address] [phone]

## 2021-11-07 ENCOUNTER — Ambulatory Visit: Payer: Medicare HMO | Attending: Physician Assistant | Admitting: Physician Assistant

## 2021-11-07 ENCOUNTER — Encounter: Payer: Self-pay | Admitting: Physician Assistant

## 2021-11-07 VITALS — BP 126/78 | HR 65 | Ht 65.0 in | Wt 178.0 lb

## 2021-11-07 DIAGNOSIS — Z79899 Other long term (current) drug therapy: Secondary | ICD-10-CM

## 2021-11-07 DIAGNOSIS — I10 Essential (primary) hypertension: Secondary | ICD-10-CM

## 2021-11-07 DIAGNOSIS — R002 Palpitations: Secondary | ICD-10-CM

## 2021-11-07 DIAGNOSIS — I471 Supraventricular tachycardia, unspecified: Secondary | ICD-10-CM

## 2021-11-07 DIAGNOSIS — R5383 Other fatigue: Secondary | ICD-10-CM

## 2021-11-07 DIAGNOSIS — I340 Nonrheumatic mitral (valve) insufficiency: Secondary | ICD-10-CM

## 2021-11-07 DIAGNOSIS — R079 Chest pain, unspecified: Secondary | ICD-10-CM

## 2021-11-07 DIAGNOSIS — I4819 Other persistent atrial fibrillation: Secondary | ICD-10-CM | POA: Diagnosis not present

## 2021-11-07 NOTE — Telephone Encounter (Signed)
See office visit.  Recommend patient follow-up with EP given her concerns regarding Tikosyn, and with noted breakthrough A-fib on therapy.

## 2021-11-07 NOTE — Patient Instructions (Signed)
Medication Instructions:  Your physician recommends that you continue on your current medications as directed. Please refer to the Current Medication list given to you today.  *If you need a refill on your cardiac medications before your next appointment, please call your pharmacy*   Lab Work: None  If you have labs (blood work) drawn today and your tests are completely normal, you will receive your results only by: [place] (if you have MyChart) OR A paper copy in the mail If you have any lab test that is abnormal or we need to change your treatment, we will call you to review the results.   Testing/Procedures: [place]  Your caregiver has ordered a Stress Test with nuclear imaging. The purpose of this test is to evaluate the blood supply to your heart muscle. This procedure is referred to as a "Non-Invasive Stress Test." This is because other than having an IV started in your vein, nothing is inserted or "invades" your body. Cardiac stress tests are done to find areas of poor blood flow to the heart by determining the extent of coronary artery disease (CAD). Some patients exercise on a treadmill, which naturally increases the blood flow to your heart, while others who are  unable to walk on a treadmill due to physical limitations have a pharmacologic/chemical stress agent called Lexiscan . This medicine will mimic walking on a treadmill by temporarily increasing your coronary blood flow.   Please note: these test may take anywhere between 2-4 hours to complete  PLEASE REPORT TO [facility] AT THE FIRST DESK WILL DIRECT YOU WHERE TO GO  Date of Procedure:________________________  Arrival Time for Procedure:__________________________  Instructions regarding medication:   _XX___:  Hold Metformin the night before and the morning of your test.   _XX___ : Dennis Bast may take all other medications with a small sip of water.     PLEASE NOTIFY THE  OFFICE AT LEAST 36 HOURS IN ADVANCE IF YOU ARE UNABLE TO KEEP YOUR APPOINTMENT.  [phone] AND  PLEASE NOTIFY NUCLEAR MEDICINE AT [facility] AT LEAST 24 HOURS IN ADVANCE IF YOU ARE UNABLE TO KEEP YOUR APPOINTMENT. [phone]  How to prepare for your Myoview test:  Do not eat or drink after midnight No caffeine for 24 hours prior to test No smoking 24 hours prior to test. Your medication may be taken with water.  If your doctor stopped a medication because of this test, do not take that medication. Ladies, please do not wear dresses.  Skirts or pants are appropriate. Please wear a short sleeve shirt. No perfume, cologne or lotion. Wear comfortable walking shoes. No heels!    Follow-Up: At [facility] you and your health needs are our priority.  As part of our continuing mission to provide you with exceptional heart care, we have created designated Provider Care Teams.  These Care Teams include your primary Cardiologist (physician) and Advanced Practice Providers (APPs -  Physician Assistants and Nurse Practitioners) who all work together to provide you with the care you need, when you need it.   Your next appointment:   Follow up after testing has been done.   The format for your next appointment:   In Person  Provider:   Kathlyn Sacramento, MD or Christell Faith, PA-C      Important Information About Sugar

## 2021-11-15 ENCOUNTER — Other Ambulatory Visit: Payer: Self-pay | Admitting: Internal Medicine

## 2021-11-15 DIAGNOSIS — I4819 Other persistent atrial fibrillation: Secondary | ICD-10-CM

## 2021-11-15 NOTE — Telephone Encounter (Signed)
Refill request

## 2021-11-15 NOTE — Telephone Encounter (Signed)
Eliquis '5mg'$  refill request received. Patient is 76 years old, weight-80.7kg, Crea-1.05 on 10/24/21, Diagnosis-Afib, and last seen by Christell Faith on 11/07/2021. Dose is appropriate based on dosing criteria. Will send in refill to requested pharmacy.

## 2021-11-16 ENCOUNTER — Ambulatory Visit
Admission: RE | Admit: 2021-11-16 | Discharge: 2021-11-16 | Disposition: A | Discharge status: Home / Self Care | Payer: Medicare HMO | Source: Ambulatory Visit | Attending: Physician Assistant | Admitting: Physician Assistant

## 2021-11-16 DIAGNOSIS — R079 Chest pain, unspecified: Secondary | ICD-10-CM

## 2021-11-16 LAB — NM MYOCAR MULTI W/SPECT W/WALL MOTION / EF
LV dias vol: 97 mL (ref 46–106)
LV sys vol: 30 mL
Nuc Stress EF: 69 %
Peak HR: 76 {beats}/min
Rest HR: 58 {beats}/min
Rest Nuclear Isotope Dose: 10.3 mCi
SDS: 15
SRS: 2
SSS: 15
ST Depression (mm): 0 mm
Stress Nuclear Isotope Dose: 31.2 mCi
TID: 1.02

## 2021-11-16 MED ORDER — TECHNETIUM TC 99M TETROFOSMIN IV KIT
31.1800 | PACK | Freq: Once | INTRAVENOUS | Status: AC | PRN
Start: 2021-11-16 — End: 2021-11-16
  Administered 2021-11-16: 31.18 via INTRAVENOUS

## 2021-11-16 MED ORDER — REGADENOSON 0.4 MG/5ML IV SOLN
0.4000 mg | Freq: Once | INTRAVENOUS | Status: AC
  Administered 2021-11-16: 0.4 mg via INTRAVENOUS

## 2021-11-16 MED ORDER — TECHNETIUM TC 99M TETROFOSMIN IV KIT
10.3100 | PACK | Freq: Once | INTRAVENOUS | Status: AC | PRN
  Administered 2021-11-16: 10.31 via INTRAVENOUS

## 2021-11-19 NOTE — Progress Notes (Unsigned)
Cardiology Office Note    Date:  11/21/2021   ID:  [name], DOB [birth year], MRN [id number]  PCP:  Jinny Sanders, MD  Cardiologist:  Kathlyn Sacramento, MD  Electrophysiologist:  Virl Axe, MD   Chief Complaint: Follow-up  History of Present Illness:   [name] is a 76 y.o. female with history of persistent A. fib on Eliquis status post cardioversion in 01/2016, 05/2016, and 11/2016 status post radiofrequency catheter ablation on 12/17/2016 s/p repeat DCCV 09/07/2019 followed by EP/A. fib clinic, atrial tachycardia s/p DCCV 09/18/2018, pulmonary hypertension, mitral valve prolapse with mitral regurgitation, DM2, hypertension, hyperlipidemia, hypothyroidism, anemia, obesity, OSA on CPAP, diverticulosis, GERD, and anxiety who presents for follow-up of Lexiscan MPI.   Prior cardiac cath in 12/2008 for atypical chest pain showed no significant CAD with mildly elevated LVEDP and normal EF.  She was initially diagnosed with A. fib in 09/2013 and was quite symptomatic with this.  Echo at that time showed normal LV systolic function with mild mitral regurgitation, moderately dilated left atrium, and mild to moderate pulmonary hypertension.  Given it was felt she would likely need antiarrhythmic therapy due to symptoms, nuclear stress test in 09/2013 was undertaken and was normal.  With recurrence of A. fib, she was subsequently placed on flecainide and underwent cardioversion in 01/2016.  She had recurrent A. fib in 05/2016 and underwent repeat successful cardioversion at that time.  She was referred to the A. fib clinic.  She had recurrent A. fib in 11/2016 and underwent a third cardioversion.  She then underwent radiofrequency catheter ablation on 12/17/2016.  Coronary CTA/morphology at that time showed a calcium score of 0.  She was seen on 09/15/2018 noting a 4 to 5-day history of mild palpitations that felt different than her prior episodes of A. fib along with some brief episodes of dizziness.   After review with EP, she was noted to be in atrial tachycardia with long cycle Mobitz type I AV block. Her flecainide was increased to 50 mg bid and she underwent successful DCCV on 09/18/2018.  In follow up with the Afib clinic in 04/2019, she was maintaining sinus rhythm.  Echo in 04/2019 showed an EF of 55 to 60%, mildly dilated left ventricular cavity, no regional wall motion abnormalities, normal RV systolic function and ventricular cavity size, severely dilated left atrium measuring 55 mm, mild mitral regurgitation, mild aortic valve sclerosis without evidence of stenosis.  She had recurrent Afib in 07/2019, and was scheduled for DCCV.  She converted to sinus rhythm prior to this being completed.  At that time, her flecainide was titrated to 100 mg bid.  She was seen by Dr. Fletcher Anon on 09/02/2019, and was back in symptomatic Afib with noted dizziness, fatigue, and a syncopal episode.  Her diltiazem was changed to metoprolol and she subsequently underwent successful DCCV on 09/07/2019 to sinus bradycardia.  Her moderate mitral regurgitation was not felt to be driving her Afib.  With her severely dilated left atrium, it was felt it may be difficult to keep her in sinus rhythm.  She was re-referred to EP in this setting.  She was seen in follow up on 09/17/2019 and was doing well, and maintaining sinus rhythm. She was mildly bradycardic with a heart rate of 54 bpm, though was asymptomatic.  BP was 140/60.  She was seen in the office on 10/01/2019 for evaluation of dizziness with possible transient syncope which led to the tapering of her flecainide to 50 mg bid  with improvement in symptoms.  She was seen in follow up by Dr. Rayann Heman on 10/18/2019, and was doing well.  With regards to her Afib burden and symptoms, ILR was implanted.  She was seen in this office in 12/2019 by Dr. Fletcher Anon and was doing well, without persistent Afib episodes noted on ILR.  She was seen by Dr. Rayann Heman in 01/2020 with a < 1% Afib burden noted on ILR,  with many episodes felt to appear to be sinus with PACs rather than actual Afib.  Repeat echo in 04/2020, to trend her mitral regurgitation, showed an EF of 55-60%, no RWMA, mildly dilated LV cavity size, normal RVSF and ventricular cavity size, PASP 49 mmHg, severely dilated left atrium, moderate mitral regurgitation, mild aortic sclerosis without stenosis, and a right atrial pressure of 3 mmHg.  Echo in 04/2020 demonstrated an EF of 55 to 60%, no regional wall motion abnormalities, indeterminate LV diastolic function parameters, normal RV systolic function and ventricular cavity size, PASP 49 mmHg, severely dilated left atrium, moderate mitral regurgitation, mild aortic valve sclerosis without evidence of stenosis, and an estimated right atrial pressure of 3 mmHg. She is followed by EP and the A-fib clinic.  ILR interrogations have demonstrated intermittent A-fib burden.  She underwent outpatient cardiac monitoring in 03/2021 which showed a predominant underlying rhythm of atrial fibrillation with an overall burden of 56%, 3 episodes of SVT with the longest and fastest interval lasting 14.1 seconds, though there was difficulty discerning atrial activity making definitive diagnosis difficult to ascertain.  Most recent ILR interrogation showed a 7.8% burden of A-fib and 270 AT episodes.  She was last seen in the office on 11/07/2021 noting a 2 to 3-week history of intermittent chest discomfort that was more noticeable at nighttime when laying down.  She was without exertional symptoms.  There was some associated shortness of breath and dizziness.  She continued to note palpitations.  She also noted a sense of fatigue which she correlated with initiating dofetilide.  She has indicated she would like to talk with EP about discontinuing Tikosyn, if possible.  She underwent Lexiscan MPI on 11/16/2021 showed no significant ischemia with an EF of 75%, and was overall low risk.  CT attenuated corrected images showed no  significant coronary calcification with minimal aortic atherosclerosis.  She comes in today continuing to note significant fatigue and insomnia.  She also continues to note intermittent palpitations that wax and wane, though are improved from symptoms prior to initiation of dofetilide.  No symptoms concerning for angina or decompensation.  She also notes some dizziness and near syncope without frank syncope.  No falls or symptoms concerning for bleeding.  No lower extremity swelling.  She is adherent to her cardiac medications.  She has follow-up with EP next week to discuss her ongoing concerns regarding continuation of Tikosyn with underlying palpitations and fatigue.     Labs independently reviewed: 10/2021 - A1c 8.0, TC 147, TG 79, HDL 57, LDL 73, potassium 4.6, BUN 13, serum creatinine 1.05, albumin 3.9, AST/ALT normal, Hgb 10.6, PLT 333 05/2021 - magnesium 2.0 02/2021 - TSH normal    Past Medical History:  Diagnosis Date   Anemia    Anxiety    Atrial fibrillation (HCC)    Colon polyps    Constipation    Depressive disorder, not elsewhere classified    Diabetes mellitus without complication ([place])    Diaphragmatic hernia without mention of obstruction or gangrene    Disorders of bursae and tendons in  shoulder region, unspecified    Diverticulosis of colon (without mention of hemorrhage)    External hemorrhoid    Fundic gland polyps of stomach, benign    GERD (gastroesophageal reflux disease)    History of hiatal hernia    HTN (hypertension)    Hypertension    Hypothyroidism    Internal hemorrhoids without mention of complication    Moderate mitral regurgitation    Obesity, unspecified    OSA on CPAP    compliant with CPAP   Other urinary incontinence    Persistent atrial fibrillation ([place])    Pure hypercholesterolemia    Type II diabetes mellitus ([place])    Urinary frequency    Urinary urgency    Vitamin B 12 deficiency     Past Surgical History:  Procedure Laterality  Date   ABDOMINAL HYSTERECTOMY     ABDOMINAL HYSTERECTOMY  1972   Have both ovaries   ATRIAL FIBRILLATION ABLATION  12/17/2016   ATRIAL FIBRILLATION ABLATION N/A 12/17/2016   Procedure: ATRIAL FIBRILLATION ABLATION;  Surgeon: Thompson Grayer, MD;  Location: [facility];  Service: Cardiovascular;  Laterality: N/A;   [place]   [facility]; "Dr. [name]"   CARDIOVERSION N/A 05/27/2016   Procedure: CARDIOVERSION;  Surgeon: [name], MD;  Location: [facility];  Service: Cardiovascular;  Laterality: N/A;   CARDIOVERSION N/A 11/25/2016   Procedure: CARDIOVERSION;  Surgeon: [name], MD;  Location: [facility];  Service: Cardiovascular;  Laterality: N/A;   CARDIOVERSION N/A 09/18/2018   Procedure: CARDIOVERSION;  Surgeon: [name], MD;  Location: [facility];  Service: Cardiovascular;  Laterality: N/A;   CARDIOVERSION N/A 07/28/2019   Procedure: CARDIOVERSION;  Surgeon: Kate Sable, MD;  Location: [facility];  Service: Cardiovascular;  Laterality: N/A;   CARDIOVERSION N/A 09/07/2019   Procedure: CARDIOVERSION;  Surgeon: Nelva Bush, MD;  Location: [facility];  Service: Cardiovascular;  Laterality: N/A;   CATARACT EXTRACTION W/ INTRAOCULAR LENS  IMPLANT, BILATERAL Bilateral    COLONOSCOPY     ELECTROPHYSIOLOGIC STUDY N/A 01/24/2016   Procedure: CARDIOVERSION;  Surgeon: [name], MD;  Location: [facility];  Service: Cardiovascular;  Laterality: N/A;   implantable loop recorder implantation  10/18/2019   [employer] Reveal [place] model [id number]) implantable loop recorder    LAPAROSCOPIC CHOLECYSTECTOMY     REDUCTION MAMMAPLASTY Bilateral    TONSILLECTOMY     TOTAL HIP ARTHROPLASTY Right 05/12/2018   Procedure: TOTAL HIP ARTHROPLASTY ANTERIOR APPROACH;  Surgeon: Paralee Cancel, MD;  Location: [facility];  Service: Orthopedics;  Laterality: Right;  70 mins    Current Medications: Current Meds  Medication Sig   apixaban (ELIQUIS) 5 MG TABS tablet TAKE  ONE TABLET BY MOUTH TWICE DAILY   Ascorbic Acid (VITAMIN C PO) Take 1 tablet by mouth at bedtime.   atorvastatin (LIPITOR) 10 MG tablet TAKE ONE TABLET BY MOUTH AT BEDTIME   calcium carbonate (OS-CAL - DOSED IN MG OF ELEMENTAL CALCIUM) 1250 (500 Ca) MG tablet Take 1 tablet by mouth daily with breakfast.   dofetilide (TIKOSYN) 250 MCG capsule Take 1 capsule (250 mcg total) by mouth 2 (two) times daily.   hydroxypropyl methylcellulose / hypromellose (ISOPTO TEARS / GONIOVISC) 2.5 % ophthalmic solution Place 1 drop into both eyes 2 (two) times daily as needed for dry eyes.   linagliptin (TRADJENTA) 5 MG TABS tablet Take 1 tablet (5 mg total) by mouth daily.   magnesium oxide (MAG-OX) 400 MG tablet Take 1 tablet (400 mg total) by  mouth 2 (two) times daily.   metFORMIN (GLUCOPHAGE-XR) 500 MG 24 hr tablet Take 1 tablet (500 mg total) by mouth in the morning and at bedtime.   MYRBETRIQ 50 MG TB24 tablet Take 50 mg by mouth daily.   ONETOUCH ULTRA test strip CHECK BLOOD SUGAR 2 TIMES A DAY AS DIRECTED DX CODE E11.9   sertraline (ZOLOFT) 50 MG tablet Take 1 tablet (50 mg total) by mouth daily.   [DISCONTINUED] metoprolol succinate (TOPROL-XL) 50 MG 24 hr tablet Take one tablet by mouth at bedtime    Allergies:   Cortisone, Pravachol [pravastatin sodium], and Contrast media [iodinated contrast media]   Social History   Socioeconomic History   Marital status: Married    Spouse name: Jimmy   Number of children: 2   Years of education: Not on file   Highest education level: Not on file  Occupational History   Occupation: retired  Tobacco Use   Smoking status: Never   Smokeless tobacco: Never   Tobacco comments:    Never smoke 08/30/21  Vaping Use   Vaping Use: Never used  Substance and Sexual Activity   Alcohol use: Yes    Comment: a little bit of wine at bedtime to help sleep   Drug use: No   Sexual activity: Yes  Other Topics Concern   Not on file  Social History Narrative   ** Merged  History Encounter **       Exercise walks 3 times daily Lives with spouse in [place] - retired from employment 1 son and 1 daughter  Diet fruit, occ fast food, salads and lean meat  3 tea/day, no EtOH, no tobacco, no drugs    Social Determinants of Health   Financial Resource Strain: Low Risk  (08/31/2021)   Overall Financial Resource Strain (CARDIA)    Difficulty of Paying Living Expenses: Not hard at all  Food Insecurity: No Food Insecurity (08/31/2021)   Hunger Vital Sign    Worried About Running Out of Food in the Last Year: Never true    [place] in the Last Year: Never true  Transportation Needs: No Transportation Needs (08/31/2021)   PRAPARE - Hydrologist (Medical): No    Lack of Transportation (Non-Medical): No  Physical Activity: Unknown (08/31/2021)   Exercise Vital Sign    Days of Exercise per Week: 3 days    Minutes of Exercise per Session: Not on file  Stress: No Stress Concern Present (08/31/2021)   [place]    Feeling of Stress : Only a little  Social Connections: Socially Integrated (08/31/2021)   Social Connection and Isolation Panel [NHANES]    Frequency of Communication with Friends and Family: More than three times a week    Frequency of Social Gatherings with Friends and Family: More than three times a week    Attends Religious Services: More than 4 times per year    Active Member of [employer] or Organizations: Yes    Attends Music therapist: More than 4 times per year    Marital Status: Married     Family History:  The patient's family history includes Cancer in her brother and mother; Dementia in her maternal aunt and mother; Diabetes in her brother and mother; Hyperlipidemia in her father and mother; Hypertension in her sister; Kidney cancer in her son; Lung cancer in her father; Pulmonary embolism in her mother; Stroke in her  paternal grandfather and paternal grandmother. There is no history of Colon cancer or Esophageal cancer.  ROS:   Review of Systems  Constitutional:  Positive for malaise/fatigue. Negative for chills, diaphoresis, fever and weight loss.  HENT:  Negative for congestion.   Eyes:  Negative for discharge and redness.  Respiratory:  Negative for cough, sputum production and wheezing.   Cardiovascular:  Positive for palpitations. Negative for orthopnea, claudication, leg swelling and PND.  Gastrointestinal:  Negative for blood in stool and melena.  Musculoskeletal:  Negative for falls and myalgias.  Skin:  Negative for rash.  Neurological:  Positive for dizziness and weakness. Negative for tingling, tremors, sensory change, speech change, focal weakness and loss of consciousness.  Endo/Heme/Allergies:  Does not bruise/bleed easily.  Psychiatric/Behavioral:  Negative for substance abuse. The patient has insomnia. The patient is not nervous/anxious.   All other systems reviewed and are negative.    EKGs/Labs/Other Studies Reviewed:    Studies reviewed were summarized above. The additional studies were reviewed today:  2D echo 04/15/2019: 1. Normal LV systolic function; mild LVE; mild eccentric MR; severe LAE.   2. Left ventricular ejection fraction, by estimation, is 55 to 60%. The  left ventricle has normal function. The left ventrical has no regional  wall motion abnormalities. The left ventricular internal cavity size was  mildly dilated. Left ventricular  diastolic parameters are indeterminate.   3. Right ventricular systolic function is normal. The right ventricular  size is normal.   4. Left atrial size was severely dilated.   5. The mitral valve is normal in structure and function. Mild mitral  valve regurgitation. No evidence of mitral stenosis.   6. The aortic valve is tricuspid. Aortic valve regurgitation is not  visualized. Mild aortic valve sclerosis is present, with no evidence  of  aortic valve stenosis.   7. The inferior vena cava is normal in size with greater than 50%  respiratory variability, suggesting right atrial pressure of 3 mmHg. __________   2D echo 04/25/2020: 1. Left ventricular ejection fraction, by estimation, is 55 to 60%. The  left ventricle has normal function. The left ventricle has no regional  wall motion abnormalities. The left ventricular internal cavity size was  mildly dilated. Left ventricular  diastolic parameters are indeterminate.   2. Right ventricular systolic function is normal. The right ventricular  size is normal. There is moderately elevated pulmonary artery systolic  pressure. The estimated right ventricular systolic pressure is 93.9 mmHg.   3. Left atrial size was severely dilated.   4. The mitral valve is normal in structure. Moderate mitral valve  regurgitation. No evidence of mitral stenosis.   5. The aortic valve is normal in structure. Aortic valve regurgitation is  not visualized. Mild aortic valve sclerosis is present, with no evidence  of aortic valve stenosis.   6. The inferior vena cava is normal in size with greater than 50%  respiratory variability, suggesting right atrial pressure of 3 mmHg.   Comparison(s): Prior Echo showed LV EF 55-60%, severe LAE with eccentric  MR.  __________   Elwyn Reach patch 03/2021: Patient had a min HR of 45 bpm, max HR of 166 bpm, and avg HR of 93 bpm. Predominant underlying rhythm was Atrial Fibrillation. First Degree AV Block was present. 3 Supraventricular Tachycardia runs occurred, the run with the fastest interval lasting  14.1 secs with a max rate of 146 bpm (avg 126 bpm); the run with the fastest interval was also the longest. Atrial Fibrillation occurred (  56% burden), ranging from 56-166 bpm (avg of 119 bpm), the longest lasting 6 days 13 hours with an avg rate of 120  bpm. Atrial Fibrillation was detected within +/- 45 seconds of symptomatic patient event(s). Isolated SVEs were  rare (<1.0%), SVE Couplets were rare (<1.0%), and SVE Triplets were rare (<1.0%). Isolated VEs were rare (<1.0%), VE Couplets were rare  (<1.0%), and no VE Triplets were present. Difficulty discerning atrial activity making definitive diagnosis difficult to ascertain.   1. NSR with sinus tachy and sinus bradycardia 2. Periods of SVT up to 145/min at times transitioning to atrial fib vs SVT with block 3. No VT 4. Rare PVC's and occaisional PAC's __________  Carlton Adam MPI 11/16/2021: Pharmacological myocardial perfusion imaging study with no significant  ischemia GI uptake artifact noted Normal wall motion, EF estimated at 75% No EKG changes concerning for ischemia at peak stress or in recovery. CT attenuation correction images with no significant coronary calcification, minimal aortic atherosclerosis Low risk scan   EKG:  EKG is ordered today.  The EKG ordered today demonstrates sinus bradycardia with sinus arrhythmia, 50 bpm, QTc 442, no acute ST-T changes  Recent Labs: 02/20/2021: TSH 2.43 05/18/2021: Magnesium 2.0 10/24/2021: ALT 11; BUN 13; Creatinine, Ser 1.05; Hemoglobin 10.6; Platelets 333.0; Potassium 4.6; Sodium 136  Recent Lipid Panel    Component Value Date/Time   CHOL 147 10/24/2021 1129   CHOL 147 11/20/2016 0919   TRIG 79.0 10/24/2021 1129   HDL 57.70 10/24/2021 1129   HDL 47 11/20/2016 0919   CHOLHDL 3 10/24/2021 1129   VLDL 15.8 10/24/2021 1129   LDLCALC 73 10/24/2021 1129   LDLCALC 89 11/20/2016 0919   LDLDIRECT 163.1 07/04/2011 0922    PHYSICAL EXAM:    VS:  BP (!) 100/50 (BP Location: Left Arm, Patient Position: Sitting, Cuff Size: Normal)   Pulse (!) 50   Ht 5' 5.5" (1.664 m)   Wt 178 lb (80.7 kg)   LMP  (LMP Unknown)   SpO2 98%   BMI 29.17 kg/m   BMI: Body mass index is 29.17 kg/m.  Physical Exam Vitals reviewed.  Constitutional:      Appearance: She is well-developed.  HENT:     Head: Normocephalic and atraumatic.  Eyes:     General:         Right eye: No discharge.        Left eye: No discharge.  Neck:     Vascular: No JVD.  Cardiovascular:     Rate and Rhythm: Normal rate and regular rhythm. Occasional Extrasystoles are present.    Pulses:          Posterior tibial pulses are 2+ on the right side and 2+ on the left side.     Heart sounds: S1 normal and S2 normal. Heart sounds not distant. No midsystolic click and no opening snap. Murmur heard.     Systolic murmur is present with a grade of 1/6 at the lower left sternal border.     No friction rub.  Pulmonary:     Effort: Pulmonary effort is normal. No respiratory distress.     Breath sounds: Normal breath sounds. No decreased breath sounds, wheezing or rales.  Chest:     Chest wall: No tenderness.  Abdominal:     General: There is no distension.  Musculoskeletal:     Cervical back: Normal range of motion.     Right lower leg: No edema.     Left lower leg: No edema.  Skin:    General: Skin is warm and dry.     Nails: There is no clubbing.  Neurological:     Mental Status: She is alert and oriented to person, place, and time.  Psychiatric:        Speech: Speech normal.        Behavior: Behavior normal.        Thought Content: Thought content normal.        Judgment: Judgment normal.     Wt Readings from Last 3 Encounters:  11/21/21 178 lb (80.7 kg)  11/07/21 178 lb (80.7 kg)  11/01/21 180 lb 2 oz (81.7 kg)     ASSESSMENT & PLAN:   Chest pain: Symptoms overall improved.  Prior calcium score of 0.  Lexiscan MPI earlier this month without evidence of ischemia.  Continue aggressive risk factor modification and primary prevention.  She remains on apixaban in place of aspirin given underlying A-fib in an effort to minimize bleeding risk.  Continue metoprolol as outlined below along with atorvastatin.  No indication for further ischemic testing at this time.  Persistent A-fib: Maintaining sinus rhythm with a bradycardic rate.  She continues to have intermittent  A-fib burden despite Tikosyn use with most recent loop recorder interrogation showing a 7.8% A-fib burden.  She is significantly bradycardic and mildly hypotensive in the office today.  We will reduce Toprol-XL to 25 mg nightly.  She remains on Tikosyn 250 mcg twice daily.  CHA2DS2-VASc at least 4.  She remains on apixaban 5 mg twice daily without bleeding concerns.  Most recent labs stable, including potassium, magnesium, renal function, and Hgb.  She would like to discuss the possible discontinuation of Tikosyn with EP at her appointment next week.  She also would like to discuss repeat ablation with them.  Paroxysmal SVT/atrial tachycardia: Follow-up with EP as directed.  Fatigue: More pronounced following the initiation of Tikosyn.  Insomnia also likely contributing.  We will reach out to our pharmacy team for medication review regarding potential sleep aid with dofetilide usage.  Mitral regurgitation: Moderate by echo in 04/2020.  Consider updating at next appointment.  HTN: Blood pressure soft in the office today.  Reduce metoprolol as outlined above.  HLD: LDL 73.  She remains on atorvastatin 10 mg.    Disposition: F/u with Dr. Fletcher Anon or an APP in 4 months, and EP as directed.   Medication Adjustments/Labs and Tests Ordered: Current medicines are reviewed at length with the patient today.  Concerns regarding medicines are outlined above. Medication changes, Labs and Tests ordered today are summarized above and listed in the Patient Instructions accessible in Encounters.   Signed, Christell Faith, PA-C 11/21/2021 9:05 AM     [address] [place] [place] [place], [place] [id number] [phone]

## 2021-11-21 ENCOUNTER — Ambulatory Visit: Payer: Medicare HMO | Attending: Physician Assistant | Admitting: Physician Assistant

## 2021-11-21 ENCOUNTER — Encounter: Payer: Self-pay | Admitting: Physician Assistant

## 2021-11-21 VITALS — BP 100/50 | HR 50 | Ht 65.5 in | Wt 178.0 lb

## 2021-11-21 DIAGNOSIS — I340 Nonrheumatic mitral (valve) insufficiency: Secondary | ICD-10-CM | POA: Diagnosis not present

## 2021-11-21 DIAGNOSIS — I10 Essential (primary) hypertension: Secondary | ICD-10-CM | POA: Diagnosis not present

## 2021-11-21 DIAGNOSIS — I471 Supraventricular tachycardia, unspecified: Secondary | ICD-10-CM

## 2021-11-21 DIAGNOSIS — I4819 Other persistent atrial fibrillation: Secondary | ICD-10-CM

## 2021-11-21 DIAGNOSIS — R079 Chest pain, unspecified: Secondary | ICD-10-CM | POA: Diagnosis not present

## 2021-11-21 DIAGNOSIS — R5383 Other fatigue: Secondary | ICD-10-CM

## 2021-11-21 DIAGNOSIS — Z79899 Other long term (current) drug therapy: Secondary | ICD-10-CM

## 2021-11-21 DIAGNOSIS — E785 Hyperlipidemia, unspecified: Secondary | ICD-10-CM | POA: Diagnosis not present

## 2021-11-21 DIAGNOSIS — R002 Palpitations: Secondary | ICD-10-CM | POA: Diagnosis not present

## 2021-11-21 MED ORDER — METOPROLOL SUCCINATE ER 25 MG PO TB24: ORAL_TABLET | ORAL | 3 refills | Status: DC

## 2021-11-21 NOTE — Patient Instructions (Signed)
Medication Instructions:   DECREASE Metoprolol - Take one tablet ('25mg'$ ) by mouth daily.   *If you need a refill on your cardiac medications before your next appointment, please call your pharmacy*   Lab Work:  1.None Ordered  If you have labs (blood work) drawn today and your tests are completely normal, you will receive your results only by: MyChart Message (if you have MyChart) OR A paper copy in the mail If you have any lab test that is abnormal or we need to change your treatment, we will call you to review the results.   Testing/Procedures:  None Ordered   Follow-Up: At [facility], you and your health needs are our priority.  As part of our continuing mission to provide you with exceptional heart care, we have created designated Provider Care Teams.  These Care Teams include your primary Cardiologist (physician) and Advanced Practice Providers (APPs -  Physician Assistants and Nurse Practitioners) who all work together to provide you with the care you need, when you need it.  We recommend signing up for the patient portal called "MyChart".  Sign up information is provided on this After Visit Summary.  MyChart is used to connect with patients for Virtual Visits (Telemedicine).  Patients are able to view lab/test results, encounter notes, upcoming appointments, etc.  Non-urgent messages can be sent to your provider as well.   To learn more about what you can do with MyChart, go to [email or web address].    Your next appointment:   3 - 4 month(s)  The format for your next appointment:   In Person  Provider:   You may see Kathlyn Sacramento, MD or one of the following Advanced Practice Providers on your designated Care Team:   Murray Hodgkins, NP Christell Faith, PA-C Cadence Kathlen Mody, PA-C Gerrie Nordmann, NP   Important Information About Sugar

## 2021-11-22 NOTE — Progress Notes (Signed)
Carelink Summary Report / Loop Recorder 

## 2021-11-23 ENCOUNTER — Ambulatory Visit: Payer: Medicare HMO | Admitting: Family Medicine

## 2021-11-27 ENCOUNTER — Encounter: Payer: Medicare HMO | Admitting: Internal Medicine

## 2021-11-27 ENCOUNTER — Ambulatory Visit (INDEPENDENT_AMBULATORY_CARE_PROVIDER_SITE_OTHER): Payer: Medicare HMO | Admitting: Family Medicine

## 2021-11-27 ENCOUNTER — Encounter: Payer: Self-pay | Admitting: Family Medicine

## 2021-11-27 DIAGNOSIS — F411 Generalized anxiety disorder: Secondary | ICD-10-CM

## 2021-11-27 DIAGNOSIS — F330 Major depressive disorder, recurrent, mild: Secondary | ICD-10-CM | POA: Diagnosis not present

## 2021-11-27 DIAGNOSIS — R69 Illness, unspecified: Secondary | ICD-10-CM | POA: Diagnosis not present

## 2021-11-27 MED ORDER — SERTRALINE HCL 100 MG PO TABS: 50.0000 mg | ORAL_TABLET | Freq: Every day | ORAL | 5 refills | Status: DC

## 2021-11-27 NOTE — Assessment & Plan Note (Signed)
Chronic, significant improvement with increase in sertraline to 50 mg.  She still has more room for improvement though so we will increase her dose to 100 mg p.o. nightly.  She will call with an update in 4 weeks.

## 2021-11-27 NOTE — Progress Notes (Signed)
Patient ID: [name], female    DOB: 21-Nov-1945, 76 y.o.   MRN: [id number]  This visit was conducted in person.  BP 120/60   Pulse 70   Temp 98.5 F (36.9 C) (Oral)   Ht 5' 4.75" (1.645 m)   Wt 176 lb (79.8 kg)   LMP  (LMP Unknown)   SpO2 97%   BMI 29.51 kg/m    CC:  Chief Complaint  Patient presents with   Follow-up    Mood    Subjective:   HPI: [name] is a 76 y.o. female presenting on 11/27/2021 for Follow-up (Mood)  GAD: In past she was doing well on Lexapro but had to stop this given interaction with Tikosyn for A-fib. She was started on sertraline 25 mg daily several months ago, this medication was increased to 50 mg daily in the last 4 weeks. Today she reports 50 % improvement.  She still worries a lot..  more likely to do activities.  More active, still decreased energy.  IMproved anhedonia.  She is sleeping better at night with Unisom.  No SE.                              PHQ9: down to 9 from 15  GAD7 down to 10 from 14    Relevant past medical, surgical, family and social history reviewed and updated as indicated. Interim medical history since our last visit reviewed. Allergies and medications reviewed and updated. Outpatient Medications Prior to Visit  Medication Sig Dispense Refill   apixaban (ELIQUIS) 5 MG TABS tablet TAKE ONE TABLET BY MOUTH TWICE DAILY 60 tablet 6   Ascorbic Acid (VITAMIN C PO) Take 1 tablet by mouth at bedtime.     atorvastatin (LIPITOR) 10 MG tablet TAKE ONE TABLET BY MOUTH AT BEDTIME 90 tablet 0   calcium carbonate (OS-CAL - DOSED IN MG OF ELEMENTAL CALCIUM) 1250 (500 Ca) MG tablet Take 1 tablet by mouth daily with breakfast.     dofetilide (TIKOSYN) 250 MCG capsule Take 1 capsule (250 mcg total) by mouth 2 (two) times daily. 60 capsule 6   hydroxypropyl methylcellulose / hypromellose (ISOPTO TEARS / GONIOVISC) 2.5 % ophthalmic solution Place 1 drop into both eyes 2 (two) times daily as needed for dry eyes.      linagliptin (TRADJENTA) 5 MG TABS tablet Take 1 tablet (5 mg total) by mouth daily. 90 tablet 3   magnesium oxide (MAG-OX) 400 MG tablet Take 1 tablet (400 mg total) by mouth 2 (two) times daily. 60 tablet 6   metFORMIN (GLUCOPHAGE-XR) 500 MG 24 hr tablet Take 1 tablet (500 mg total) by mouth in the morning and at bedtime. 180 tablet 0   metoprolol succinate (TOPROL-XL) 25 MG 24 hr tablet Take one tablet by mouth at bedtime 90 tablet 3   MYRBETRIQ 50 MG TB24 tablet Take 50 mg by mouth daily.     ONETOUCH ULTRA test strip CHECK BLOOD SUGAR 2 TIMES A DAY AS DIRECTED DX CODE E11.9 100 strip 7   sertraline (ZOLOFT) 50 MG tablet Take 1 tablet (50 mg total) by mouth daily. 30 tablet 5   No facility-administered medications prior to visit.     Per HPI unless specifically indicated in ROS section below Review of Systems  Constitutional:  Negative for fatigue and fever.  HENT:  Negative for ear pain.   Eyes:  Negative for pain.  Respiratory:  Negative for chest tightness and shortness of breath.   Cardiovascular:  Negative for chest pain, palpitations and leg swelling.  Gastrointestinal:  Negative for abdominal pain.  Genitourinary:  Negative for dysuria.   Objective:  BP 120/60   Pulse 70   Temp 98.5 F (36.9 C) (Oral)   Ht 5' 4.75" (1.645 m)   Wt 176 lb (79.8 kg)   LMP  (LMP Unknown)   SpO2 97%   BMI 29.51 kg/m   Wt Readings from Last 3 Encounters:  11/27/21 176 lb (79.8 kg)  11/21/21 178 lb (80.7 kg)  11/07/21 178 lb (80.7 kg)      Physical Exam Constitutional:      General: She is not in acute distress.    Appearance: Normal appearance. She is well-developed. She is not ill-appearing or toxic-appearing.  HENT:     Head: Normocephalic.     Right Ear: Hearing, tympanic membrane, ear canal and external ear normal. Tympanic membrane is not erythematous, retracted or bulging.     Left Ear: Hearing, tympanic membrane, ear canal and external ear normal. Tympanic membrane is not  erythematous, retracted or bulging.     Nose: No mucosal edema or rhinorrhea.     Right Sinus: No maxillary sinus tenderness or frontal sinus tenderness.     Left Sinus: No maxillary sinus tenderness or frontal sinus tenderness.     Mouth/Throat:     Pharynx: Uvula midline.  Eyes:     General: Lids are normal. Lids are everted, no foreign bodies appreciated.     Conjunctiva/sclera: Conjunctivae normal.     Pupils: Pupils are equal, round, and reactive to light.  Neck:     Thyroid: No thyroid mass or thyromegaly.     Vascular: No carotid bruit.     Trachea: Trachea normal.  Cardiovascular:     Rate and Rhythm: Normal rate and regular rhythm.     Pulses: Normal pulses.     Heart sounds: Normal heart sounds, S1 normal and S2 normal. No murmur heard.    No friction rub. No gallop.  Pulmonary:     Effort: Pulmonary effort is normal. No tachypnea or respiratory distress.     Breath sounds: Normal breath sounds. No decreased breath sounds, wheezing, rhonchi or rales.  Abdominal:     General: Bowel sounds are normal.     Palpations: Abdomen is soft.     Tenderness: There is no abdominal tenderness.  Musculoskeletal:     Cervical back: Normal range of motion and neck supple.  Skin:    General: Skin is warm and dry.     Findings: No rash.  Neurological:     Mental Status: She is alert.  Psychiatric:        Mood and Affect: Mood is not anxious or depressed.        Speech: Speech normal.        Behavior: Behavior normal. Behavior is cooperative.        Thought Content: Thought content normal.        Judgment: Judgment normal.       Results for orders placed or performed during the hospital encounter of 11/16/21  NM Myocar Multi W/Spect W/Wall Motion / EF  Result Value Ref Range   Rest HR 58.0 bpm   Rest BP 149/74 mmHg   Peak HR 76 bpm   Peak BP 149/74 mmHg   ST Depression (mm) 0 mm   Rest Nuclear Isotope Dose 10.3 mCi   Stress Nuclear  Isotope Dose 31.2 mCi   SSS 15.0    SRS  2.0    SDS 15.0    TID 1.02    LV sys vol 30.0 mL   LV dias vol 97.0 46 - 106 mL   Nuc Stress EF 69 %     COVID 19 screen:  No recent travel or known exposure to COVID19 The patient denies respiratory symptoms of COVID 19 at this time. The importance of social distancing was discussed today.   Assessment and Plan    Problem List Items Addressed This Visit     MDD (major depressive disorder), recurrent episode, mild (HCC) (Chronic)    Chronic, improved control Will increase further to 100 mg p.o. nightly sertraline.  She will continue to use Unisom at night for sleep.      Relevant Medications   sertraline (ZOLOFT) 100 MG tablet   GAD (generalized anxiety disorder)    Chronic, significant improvement with increase in sertraline to 50 mg.  She still has more room for improvement though so we will increase her dose to 100 mg p.o. nightly.  She will call with an update in 4 weeks.      Relevant Medications   sertraline (ZOLOFT) 100 MG tablet     Eliezer Lofts, MD

## 2021-11-27 NOTE — Assessment & Plan Note (Signed)
Chronic, improved control Will increase further to 100 mg p.o. nightly sertraline.  She will continue to use Unisom at night for sleep.

## 2021-11-28 ENCOUNTER — Encounter: Payer: Self-pay | Admitting: Internal Medicine

## 2021-11-28 ENCOUNTER — Ambulatory Visit: Payer: Medicare HMO | Admitting: Internal Medicine

## 2021-11-28 VITALS — BP 140/76 | HR 74 | Ht 64.75 in | Wt 177.6 lb

## 2021-11-28 DIAGNOSIS — E119 Type 2 diabetes mellitus without complications: Secondary | ICD-10-CM

## 2021-11-28 DIAGNOSIS — E7800 Pure hypercholesterolemia, unspecified: Secondary | ICD-10-CM | POA: Diagnosis not present

## 2021-11-28 DIAGNOSIS — E1165 Type 2 diabetes mellitus with hyperglycemia: Secondary | ICD-10-CM

## 2021-11-28 DIAGNOSIS — E063 Autoimmune thyroiditis: Secondary | ICD-10-CM

## 2021-11-28 LAB — POCT GLYCOSYLATED HEMOGLOBIN (HGB A1C): Hemoglobin A1C: 6.9 % — AB (ref 4.0–5.6)

## 2021-11-28 MED ORDER — DAPAGLIFLOZIN PROPANEDIOL 5 MG PO TABS: 5.0000 mg | ORAL_TABLET | Freq: Every day | ORAL | 3 refills | Status: DC

## 2021-11-28 NOTE — Patient Instructions (Addendum)
Please continue: - Metformin ER 500 mg 2x a day - Tradjenta  5 mg before b'fast  Add: - Farxiga 5 mg before b'fast  Please return in 4 months with your sugar log.
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
## 2021-11-29 ENCOUNTER — Ambulatory Visit (INDEPENDENT_AMBULATORY_CARE_PROVIDER_SITE_OTHER): Payer: Medicare HMO

## 2021-11-29 ENCOUNTER — Other Ambulatory Visit (HOSPITAL_COMMUNITY): Payer: Self-pay | Admitting: Physician Assistant

## 2021-11-29 ENCOUNTER — Encounter: Payer: Self-pay | Admitting: Internal Medicine

## 2021-11-29 ENCOUNTER — Ambulatory Visit: Payer: Self-pay

## 2021-11-29 ENCOUNTER — Ambulatory Visit: Payer: Medicare HMO | Attending: Internal Medicine | Admitting: Internal Medicine

## 2021-11-29 VITALS — BP 110/60 | HR 55 | Ht 65.0 in | Wt 176.2 lb

## 2021-11-29 DIAGNOSIS — I10 Essential (primary) hypertension: Secondary | ICD-10-CM | POA: Diagnosis not present

## 2021-11-29 DIAGNOSIS — I4819 Other persistent atrial fibrillation: Secondary | ICD-10-CM | POA: Diagnosis not present

## 2021-11-29 DIAGNOSIS — I491 Atrial premature depolarization: Secondary | ICD-10-CM

## 2021-11-29 DIAGNOSIS — Z79899 Other long term (current) drug therapy: Secondary | ICD-10-CM | POA: Diagnosis not present

## 2021-11-29 DIAGNOSIS — I471 Supraventricular tachycardia: Secondary | ICD-10-CM | POA: Diagnosis not present

## 2021-11-29 NOTE — Progress Notes (Signed)
Patient Care Team: Jinny Sanders, MD as PCP - General (Family Medicine) [name], MD as PCP - Cardiology (Cardiology) Deboraha Sprang, MD as PCP - Electrophysiology (Cardiology) [name], MD as Consulting Physician (Internal Medicine) [name], MD as Consulting Physician (Cardiology) Wilhelmina Mcardle, MD (Inactive) as Consulting Physician (Pulmonary Disease) Jinny Sanders, MD Dannielle Karvonen, RN as Case Manager   HPI  [name] is a 76 y.o. female in today having seen Dr. Lovena Le 1/23 and previously frequently by Dr. Rayann Heman.  PVI/CTI 10/18 she has a history of atrial fibrillation for which she has been treated with flecainide 100 twice daily with anticoagulation with Eliquis  Thereafter, she developed palpitations.  Was seen in the emergency room and underwent cardioversion. Because of falls was given an event recorder>> recurrent falls and ended up in the ER 2/23  She has had in the past falls associated with atrial fibrillation onset.  More recently she has had falls unassociated with atrial fibrillation.  She is also an atrial tachycardia  identified by RU-PA for which her loop recorder was reprogrammed; she has had multiple episodes of tachycardia with a ventricular rate about 130-140, regular suggestive of atrial flutters, perhaps post ablation.  On the day of her fall, her heart rate on arrival was about 110 without distinct P waves.  Her flecainide was determined to be inadequate and she was started on dofetilide   Significantly improved initially on dofetilide; however, recently has had increasing problems with palpitations particularly in the evening hours making sleep difficult; some lightheadedness.  Also has noted worsening fatigue which she ascribes to the dofetilide.  ILR implanted 2021.  Demonstrates an atrial tachycardia/fibrillation burden 7.8% with maximum rates up to 200+.      The patient denies chest pain, shortness of  breath, nocturnal dyspnea, orthopnea or peripheral edema.  There have been no palpitations, lightheadedness or syncope.  Complains of fatigue  Difficulty with with sleeping.  Both going to sleep and staying asleep.  Recently started on Unisom (long-acting diphenhydramine-like).  With improvement.  Has diagnosed obstructive sleep apnea but has not been able to use CPAP.     Antiarrhythmics Date Reason stopped  Flecainide / Ineffective  Dofetilide 3/23       DATE TEST EF   2/22 Echo   55-60 % LAE 64m/ 40 ml./m2)  9/23 MYOVIEW 75% No ischemia        Date Cr K Hgb  3/23 1.10 4.7 11 (2/23)  8/23  1.06 4.6 140.9  Thromboembolic risk factors ( age -9 , DM-1, Gender-1) for a CHADSVASc Score of .=3     Records and Results Reviewed   Past Medical History:  Diagnosis Date   Anemia    Anxiety    Atrial fibrillation (HCC)    Colon polyps    Constipation    Depressive disorder, not elsewhere classified    Diabetes mellitus without complication ([place]    Diaphragmatic hernia without mention of obstruction or gangrene    Disorders of bursae and tendons in shoulder region, unspecified    Diverticulosis of colon (without mention of hemorrhage)    External hemorrhoid    Fundic gland polyps of stomach, benign    GERD (gastroesophageal reflux disease)    History of hiatal hernia    HTN (hypertension)    Hypertension    Hypothyroidism    Internal hemorrhoids without mention of complication    Moderate mitral regurgitation  Obesity, unspecified    OSA on CPAP    compliant with CPAP   Other urinary incontinence    Persistent atrial fibrillation (HCC)    Pure hypercholesterolemia    Type II diabetes mellitus ([place])    Urinary frequency    Urinary urgency    Vitamin B 12 deficiency     Past Surgical History:  Procedure Laterality Date   ABDOMINAL HYSTERECTOMY     ABDOMINAL HYSTERECTOMY  1972   Have both ovaries   ATRIAL FIBRILLATION ABLATION  12/17/2016   ATRIAL FIBRILLATION  ABLATION N/A 12/17/2016   Procedure: ATRIAL FIBRILLATION ABLATION;  Surgeon: Thompson Grayer, MD;  Location: [place] [facility];  Service: Cardiovascular;  Laterality: N/A;   [place]   [facility]; "Dr. [name]"   CARDIOVERSION N/A 05/27/2016   Procedure: CARDIOVERSION;  Surgeon: [name], MD;  Location: [facility];  Service: Cardiovascular;  Laterality: N/A;   CARDIOVERSION N/A 11/25/2016   Procedure: CARDIOVERSION;  Surgeon: [name], MD;  Location: [facility];  Service: Cardiovascular;  Laterality: N/A;   CARDIOVERSION N/A 09/18/2018   Procedure: CARDIOVERSION;  Surgeon: [name], MD;  Location: [facility];  Service: Cardiovascular;  Laterality: N/A;   CARDIOVERSION N/A 07/28/2019   Procedure: CARDIOVERSION;  Surgeon: Kate Sable, MD;  Location: [facility];  Service: Cardiovascular;  Laterality: N/A;   CARDIOVERSION N/A 09/07/2019   Procedure: CARDIOVERSION;  Surgeon: Nelva Bush, MD;  Location: [facility];  Service: Cardiovascular;  Laterality: N/A;   CATARACT EXTRACTION W/ INTRAOCULAR LENS  IMPLANT, BILATERAL Bilateral    COLONOSCOPY     ELECTROPHYSIOLOGIC STUDY N/A 01/24/2016   Procedure: CARDIOVERSION;  Surgeon: [name], MD;  Location: [facility];  Service: Cardiovascular;  Laterality: N/A;   implantable loop recorder implantation  10/18/2019   Medtronic Reveal [place] model [id number]) implantable loop recorder    LAPAROSCOPIC CHOLECYSTECTOMY     REDUCTION MAMMAPLASTY Bilateral    TONSILLECTOMY     TOTAL HIP ARTHROPLASTY Right 05/12/2018   Procedure: TOTAL HIP ARTHROPLASTY ANTERIOR APPROACH;  Surgeon: Paralee Cancel, MD;  Location: [facility];  Service: Orthopedics;  Laterality: Right;  70 mins    Current Meds  Medication Sig   apixaban (ELIQUIS) 5 MG TABS tablet TAKE ONE TABLET BY MOUTH TWICE DAILY   Ascorbic Acid (VITAMIN C PO) Take 1 tablet by mouth at bedtime.   atorvastatin (LIPITOR) 10 MG tablet TAKE ONE TABLET BY MOUTH AT BEDTIME    calcium carbonate (OS-CAL - DOSED IN MG OF ELEMENTAL CALCIUM) 1250 (500 Ca) MG tablet Take 1 tablet by mouth daily with breakfast.   dapagliflozin propanediol (FARXIGA) 5 MG TABS tablet Take 1 tablet (5 mg total) by mouth daily before breakfast.   dofetilide (TIKOSYN) 250 MCG capsule Take 1 capsule (250 mcg total) by mouth 2 (two) times daily.   doxylamine, Sleep, (UNISOM) 25 MG tablet Take 25 mg by mouth at bedtime as needed.   hydroxypropyl methylcellulose / hypromellose (ISOPTO TEARS / GONIOVISC) 2.5 % ophthalmic solution Place 1 drop into both eyes 2 (two) times daily as needed for dry eyes.   linagliptin (TRADJENTA) 5 MG TABS tablet Take 1 tablet (5 mg total) by mouth daily.   magnesium oxide (MAG-OX) 400 MG tablet Take 1 tablet (400 mg total) by mouth 2 (two) times daily.   metFORMIN (GLUCOPHAGE-XR) 500 MG 24 hr tablet Take 1 tablet (500 mg total) by mouth in the morning and at bedtime.   metoprolol succinate (TOPROL-XL) 25 MG 24 hr  tablet Take one tablet by mouth at bedtime   MYRBETRIQ 50 MG TB24 tablet Take 50 mg by mouth daily.   ONETOUCH ULTRA test strip CHECK BLOOD SUGAR 2 TIMES A DAY AS DIRECTED DX CODE E11.9   sertraline (ZOLOFT) 100 MG tablet Take 0.5 tablets (50 mg total) by mouth at bedtime.    Allergies  Allergen Reactions   Cortisone Shortness Of Breath and Rash   Pravachol [Pravastatin Sodium] Other (See Comments)    Leg pain    Contrast Media [Iodinated Contrast Media] Rash      Review of Systems negative except from HPI and PMH  Physical Exam BP 110/60 (BP Location: Left Arm, Patient Position: Sitting, Cuff Size: Normal)   Pulse (!) 55   Ht '5\' 5"'$  (1.651 m)   Wt 176 lb 4 oz (79.9 kg)   LMP  (LMP Unknown)   SpO2 98%   BMI 29.33 kg/m  Well developed and well nourished in no acute distress HENT normal Neck supple with JVP-flat Clear Device pocket well healed; without hematoma or erythema.  There is no tethering  Regular rate and rhythm, no  gallop No /  murmur Abd-soft with active BS No Clubbing cyanosis  edema Skin-warm and dry A & Oriented  Grossly normal sensory and motor function  ECG sinus at 55 beats intervals 16/08/44   ECG sinus rhythm at 73 Interval 17/09/40 Otherwise normal  This MRI cervical spine  CrCl cannot be calculated (Patient's most recent lab result is older than the maximum 21 days allowed.).   Assessment and  Plan  Atrial flutter-multiple variable ventricular responses  Falls/dizziness question mechanism  Fatigue  Daytime somnolence  Obstructive sleep apnea not treated secondary to insomnia  High Risk Medication Surveillance Dofetilide  Bilateral arm weakness  Anemia chronic    Relatively stable palpitations.  Significant benefit with dofetilide, however, her impression that has made the fatigue worse  Atrial fibrillation detections continue.  Some of the rates are fast.  However, the recorded strips show atrial tachycardias with rates and also fast mostly less than 1000 ms.  No bleeding.  Continue Eliquis.  We will quantitate the PVC burden and if exceedingly high >> ZIO monitor  Would consider repeat catheter ablation; will refer to Dr. Daune Perch  Anemia is chronic.  Dizziness is better  Fatigue continues to be an issue.  Now that she is sleeping somewhat better, have encouraged her to use her CPAP.  For now we will continue the dofetilide until decision is made regarding catheter ablation

## 2021-11-29 NOTE — Patient Outreach (Signed)
  Care Coordination   Follow Up Visit Note   11/29/2021 Name: NYILAH KIGHT MRN: [id number] DOB: [birth year]  [name] is a 76 y.o. year old female who sees Jinny Sanders, MD for primary care. I spoke with  [name] by phone today.  What matters to the patients health and wellness today?  Patient states the medication Tikosyn she is taking for her A-fib seems to be helping.  She reports ongoing fatigue.  She reports having a follow up visit with her cardiologist on today. She states she will be having an echocardiogram and will have to wear a  4 day heart monitor.  Patient states she takes Unisom now for sleep which seems to help.  She reports additional medication adjustments with her diabetic medication.     Goals Addressed             This Visit's Progress    Patient Stated:  Managing Health conditions       Care Coordination Interventions: Care Coordination Interventions: Discussed plans with patient for ongoing care management follow up and provided patient with direct contact information for care management team Afib action plan reviewed Reviewed medication changes and discussed importance of compliance Advised to notify provider of any new / ongoing symptoms Advised to wear CPAP as recommended by provider Advised to continue to take rest breaks as needed.              SDOH assessments and interventions completed:  No     Care Coordination Interventions Activated:  Yes  Care Coordination Interventions:  Yes, provided   Follow up plan: Follow up call scheduled for 12/24/21    Encounter Outcome:  Pt. Visit Completed   Quinn Plowman RN,BSN,CCM [place] [phone] direct line

## 2021-11-29 NOTE — Patient Instructions (Addendum)
Medication Instructions:  - Your physician recommends that you continue on your current medications as directed. Please refer to the Current Medication list given to you today.  *If you need a refill on your cardiac medications before your next appointment, please call your pharmacy*   Lab Work: - none ordered  If you have labs (blood work) drawn today and your tests are completely normal, you will receive your results only by: [place] (if you have MyChart) OR A paper copy in the mail If you have any lab test that is abnormal or we need to change your treatment, we will call you to review the results.   Testing/Procedures:  1) Echocardiogram: - Your physician has requested that you have an echocardiogram. Echocardiography is a painless test that uses sound waves to create images of your heart. It provides your doctor with information about the size and shape of your heart and how well your heart's chambers and valves are working. This procedure takes approximately one hour. There are no restrictions for this procedure. [place]    2)  Heart Monitor:  Length of Wear: 4 days  Your monitor will be mailed to your home address within 3-5 business days. However, if you have not received your monitor after 5 business days please send [name] a MyChart message or call the office at [phone], so we may follow up on this for you.   Your physician has recommended that you wear a Zio XT (heart) monitor.   This monitor is a medical device that records the heart's electrical activity. Doctors most often use these monitors to diagnose arrhythmias. Arrhythmias are problems with the speed or rhythm of the heartbeat. The monitor is a small device applied to your chest. You can wear one while you do your normal daily activities. While wearing this monitor if you have any symptoms to push the button and record what you felt. Once you have worn this monitor for the period of time provider prescribed  (Usually 14 days), you will return the monitor device in the postage paid box. Once it is returned they will download the data collected and provide [name] with a report which the provider will then review and we will call you with those results. Important tips:  Avoid showering during the first 24 hours of wearing the monitor. Avoid excessive sweating to help maximize wear time. Do not submerge the device, no hot tubs, and no swimming pools. Keep any lotions or oils away from the patch. After 24 hours you may shower with the patch on. Take brief showers with your back facing the shower head.  Do not remove patch once it has been placed because that will interrupt data and decrease adhesive wear time. Push the button when you have any symptoms and write down what you were feeling. Once you have completed wearing your monitor, remove and place into box which has postage paid and place in your outgoing mailbox.  If for some reason you have misplaced your box then call our office and we can provide another box and/or mail it off for you.        Follow-Up: At [facility], you and your health needs are our priority.  As part of our continuing mission to provide you with exceptional heart care, we have created designated Provider Care Teams.  These Care Teams include your primary Cardiologist (physician) and Advanced Practice Providers (APPs -  Physician Assistants and Nurse Practitioners) who all work together to provide  you with the care you need, when you need it.  We recommend signing up for the patient portal called "MyChart".  Sign up information is provided on this After Visit Summary.  MyChart is used to connect with patients for Virtual Visits (Telemedicine).  Patients are able to view lab/test results, encounter notes, upcoming appointments, etc.  Non-urgent messages can be sent to your provider as well.   To learn more about what you can do with MyChart, go to  [email or web address].    Your next appointment:   6 month(s)  The format for your next appointment:   In Person  Provider:   Virl Axe, MD    Other Instructions   Echocardiogram An echocardiogram is a test that uses sound waves (ultrasound) to produce images of the heart. Images from an echocardiogram can provide important information about: Heart size and shape. The size and thickness and movement of your heart's walls. Heart muscle function and strength. Heart valve function or if you have stenosis. Stenosis is when the heart valves are too narrow. If blood is flowing backward through the heart valves (regurgitation). A tumor or infectious growth around the heart valves. Areas of heart muscle that are not working well because of poor blood flow or injury from a heart attack. Aneurysm detection. An aneurysm is a weak or damaged part of an artery wall. The wall bulges out from the normal force of blood pumping through the body. Tell a health care provider about: Any allergies you have. All medicines you are taking, including vitamins, herbs, eye drops, creams, and over-the-counter medicines. Any blood disorders you have. Any surgeries you have had. Any medical conditions you have. Whether you are pregnant or may be pregnant. What are the risks? Generally, this is a safe test. However, problems may occur, including an allergic reaction to dye (contrast) that may be used during the test. What happens before the test? No specific preparation is needed. You may eat and drink normally. What happens during the test?  You will take off your clothes from the waist up and put on a hospital gown. Electrodes or electrocardiogram (ECG)patches may be placed on your chest. The electrodes or patches are then connected to a device that monitors your heart rate and rhythm. You will lie down on a table for an ultrasound exam. A gel will be applied to your chest to help sound waves pass  through your skin. A handheld device, called a transducer, will be pressed against your chest and moved over your heart. The transducer produces sound waves that travel to your heart and bounce back (or "echo" back) to the transducer. These sound waves will be captured in real-time and changed into images of your heart that can be viewed on a video monitor. The images will be recorded on a computer and reviewed by your health care provider. You may be asked to change positions or hold your breath for a short time. This makes it easier to get different views or better views of your heart. In some cases, you may receive contrast through an IV in one of your veins. This can improve the quality of the pictures from your heart. The procedure may vary among health care providers and hospitals. What can I expect after the test? You may return to your normal, everyday life, including diet, activities, and medicines, unless your health care provider tells you not to do that. Follow these instructions at home: It is up to you to get the results  of your test. Ask your health care provider, or the department that is doing the test, when your results will be ready. Keep all follow-up visits. This is important. Summary An echocardiogram is a test that uses sound waves (ultrasound) to produce images of the heart. Images from an echocardiogram can provide important information about the size and shape of your heart, heart muscle function, heart valve function, and other possible heart problems. You do not need to do anything to prepare before this test. You may eat and drink normally. After the echocardiogram is completed, you may return to your normal, everyday life, unless your health care provider tells you not to do that. This information is not intended to replace advice given to you by your health care provider. Make sure you discuss any questions you have with your health care provider. Document Revised:  11/01/2020 Document Reviewed: 10/12/2019 Elsevier Patient Education  [place]

## 2021-12-04 ENCOUNTER — Telehealth: Payer: Self-pay | Admitting: Family Medicine

## 2021-12-04 MED ORDER — SERTRALINE HCL 100 MG PO TABS: 100.0000 mg | ORAL_TABLET | Freq: Every day | ORAL | 5 refills | Status: DC

## 2021-12-04 NOTE — Telephone Encounter (Signed)
Yes, the sertraline should be sent in at 100 mg daily now.. send in new rx if needed.  Okay to take with Mg.  If she gets adequate calcium in diet ( for bone health) she can stop the oscal.

## 2021-12-04 NOTE — Telephone Encounter (Signed)
Jimmy notified as instructed by telephone.  I did go ahead and sent in Sertraline 100 mg with dosing instructions on 1 tablet qhs to [facility].

## 2021-12-04 NOTE — Telephone Encounter (Signed)
Pt's husband, Laverna Peace, called stating the pt had some questions about the following meds, sertraline (ZOLOFT) 100 MG tablet, calcium carbonate (OS-CAL - DOSED IN MG OF ELEMENTAL CALCIUM) 1250 (500 Ca) MG tablet, magnesium oxide (MAG-OX) 400 MG tablet. Pt's husband stated the sertraline (ZOLOFT) 100 MG tablet was bumped up to '100mg'$  from 50 & the intrustrions state to talk half of tablet at bedtime, would make it 50 mg, pt is wondering if this an error? Pt wanted to know what was she taking the calcium carbonate (OS-CAL - DOSED IN MG OF ELEMENTAL CALCIUM) 1250 (500 Ca) MG tablet for? Pt wanted to know if taking the magnesium oxide (MAG-OX) 400 MG tablet & sertraline (ZOLOFT) 100 MG tablet together was safe? Call back #[phone]

## 2021-12-05 DIAGNOSIS — I491 Atrial premature depolarization: Secondary | ICD-10-CM

## 2021-12-05 LAB — CUP PACEART REMOTE DEVICE CHECK
Date Time Interrogation Session: 20231004070035
Implantable Pulse Generator Implant Date: 20210816

## 2021-12-10 ENCOUNTER — Ambulatory Visit (INDEPENDENT_AMBULATORY_CARE_PROVIDER_SITE_OTHER): Payer: Medicare HMO

## 2021-12-10 DIAGNOSIS — I4819 Other persistent atrial fibrillation: Secondary | ICD-10-CM | POA: Diagnosis not present

## 2021-12-17 DIAGNOSIS — L728 Other follicular cysts of the skin and subcutaneous tissue: Secondary | ICD-10-CM | POA: Diagnosis not present

## 2021-12-17 DIAGNOSIS — L814 Other melanin hyperpigmentation: Secondary | ICD-10-CM | POA: Diagnosis not present

## 2021-12-17 DIAGNOSIS — D225 Melanocytic nevi of trunk: Secondary | ICD-10-CM | POA: Diagnosis not present

## 2021-12-17 DIAGNOSIS — L821 Other seborrheic keratosis: Secondary | ICD-10-CM | POA: Diagnosis not present

## 2021-12-17 NOTE — Progress Notes (Signed)
Carelink Summary Report / Loop Recorder 

## 2021-12-18 ENCOUNTER — Ambulatory Visit: Payer: Medicare HMO | Admitting: Family Medicine

## 2021-12-18 NOTE — Progress Notes (Unsigned)
    Jissell Trafton T. Armand Preast, MD, [place] at [facility] [place], [address]  Phone: [phone]  FAX: [phone]  [name] - 76 y.o. female  MRN [id number]  Date of Birth: 03-Oct-1945  Date: 12/19/2021  PCP: Jinny Sanders, MD  Referral: Jinny Sanders, MD  No chief complaint on file.  Subjective:   [name] is a 76 y.o. very pleasant female patient with There is no height or weight on file to calculate BMI. who presents with the following:  Pleasant 76 year old female with diabetes who presents to follow-up for bilateral frozen shoulder.  Last time I saw her she had fairly dramatic loss of motion bilaterally.  She has now had frozen shoulder for roughly 4 months.  At her last office visit, I did do a right-sided intra-articular injection.  She is now here for follow-up:    Review of Systems is noted in the HPI, as appropriate  Objective:   LMP  (LMP Unknown)   GEN: No acute distress; alert,appropriate. PULM: Breathing comfortably in no respiratory distress PSYCH: Normally interactive.   Laboratory and Imaging Data:  Assessment and Plan:   ***

## 2021-12-19 ENCOUNTER — Encounter: Payer: Self-pay | Admitting: Family Medicine

## 2021-12-19 ENCOUNTER — Ambulatory Visit (INDEPENDENT_AMBULATORY_CARE_PROVIDER_SITE_OTHER): Payer: Medicare HMO | Admitting: Family Medicine

## 2021-12-19 VITALS — BP 100/62 | HR 56 | Temp 98.3°F | Ht 65.0 in | Wt 178.1 lb

## 2021-12-19 DIAGNOSIS — M7501 Adhesive capsulitis of right shoulder: Secondary | ICD-10-CM

## 2021-12-19 DIAGNOSIS — M7502 Adhesive capsulitis of left shoulder: Secondary | ICD-10-CM | POA: Diagnosis not present

## 2021-12-19 MED ORDER — TRIAMCINOLONE ACETONIDE 40 MG/ML IJ SUSP
40.0000 mg | Freq: Once | INTRAMUSCULAR | Status: AC
  Administered 2021-12-19: 40 mg via INTRA_ARTICULAR

## 2021-12-19 NOTE — Addendum Note (Signed)
Addended by: Carter Kitten on: 12/19/2021 09:19 AM   Modules accepted: Orders

## 2021-12-21 DIAGNOSIS — I491 Atrial premature depolarization: Secondary | ICD-10-CM | POA: Diagnosis not present

## 2021-12-24 ENCOUNTER — Ambulatory Visit: Payer: Self-pay

## 2021-12-24 NOTE — Patient Outreach (Signed)
  Care Coordination   Follow Up Visit Note   12/24/2021 Name: [name] MRN: [id number] DOB: 1945/12/29  [name] is a 76 y.o. year old female who sees Jinny Sanders, MD for primary care. I spoke with  [name] by phone today.  What matters to the patients health and wellness today?  Patient reports having follow up visit with provider. States she had an injection in her left shoulder which has helped some with her pain/ ROM.   Patient reports completing heart monitor. She states she is scheduled to have her echocardiogram on 01/03/22.  Patient states she is scheduled for a consult visit with cardiovascular provider on 01/23/22.  Patient denies any further concerns at this time. She states she is waiting for heart testing results and visit with cardiovascular provider.    Goals Addressed             This Visit's Progress    Patient Stated:  Managing Health conditions       Care Coordination Interventions: Afib action plan reviewed Reviewed medication changes and discussed importance of compliance Advised to notify provider of any new / ongoing symptoms Advised to continue to take rest breaks as needed.  Reviewed provider / testing scheduled/ upcoming appointments             SDOH assessments and interventions completed:  No     Care Coordination Interventions Activated:  Yes  Care Coordination Interventions:  Yes, provided   Follow up plan: Follow up call scheduled for 01/30/22 at 2 pm    Encounter Outcome:  Pt. Visit Completed   Quinn Plowman RN,BSN,CCM [place] [phone] direct line

## 2021-12-27 ENCOUNTER — Other Ambulatory Visit: Payer: Self-pay | Admitting: Internal Medicine

## 2021-12-27 ENCOUNTER — Other Ambulatory Visit: Payer: Self-pay | Admitting: Family Medicine

## 2022-01-03 ENCOUNTER — Ambulatory Visit: Payer: Medicare HMO | Attending: Internal Medicine

## 2022-01-03 DIAGNOSIS — I4819 Other persistent atrial fibrillation: Secondary | ICD-10-CM

## 2022-01-03 LAB — ECHOCARDIOGRAM COMPLETE
AR max vel: 1.2 cm2
AV Area VTI: 1.24 cm2
AV Area mean vel: 1.11 cm2
AV Mean grad: 4 mmHg
AV Peak grad: 7.1 mmHg
Ao pk vel: 1.33 m/s
Area-P 1/2: 2.91 cm2
Calc EF: 78 %
S' Lateral: 3 cm
Single Plane A2C EF: 75 %
Single Plane A4C EF: 80.5 %

## 2022-01-14 ENCOUNTER — Ambulatory Visit (INDEPENDENT_AMBULATORY_CARE_PROVIDER_SITE_OTHER): Payer: Medicare HMO

## 2022-01-14 DIAGNOSIS — I4819 Other persistent atrial fibrillation: Secondary | ICD-10-CM | POA: Diagnosis not present

## 2022-01-15 DIAGNOSIS — R350 Frequency of micturition: Secondary | ICD-10-CM | POA: Diagnosis not present

## 2022-01-15 DIAGNOSIS — N3000 Acute cystitis without hematuria: Secondary | ICD-10-CM | POA: Diagnosis not present

## 2022-01-15 DIAGNOSIS — R8271 Bacteriuria: Secondary | ICD-10-CM | POA: Diagnosis not present

## 2022-01-15 LAB — CUP PACEART REMOTE DEVICE CHECK
Date Time Interrogation Session: 20231112231112
Implantable Pulse Generator Implant Date: 20210816

## 2022-01-23 ENCOUNTER — Institutional Professional Consult (permissible substitution): Payer: Medicare HMO | Admitting: Cardiology

## 2022-01-28 DIAGNOSIS — N3000 Acute cystitis without hematuria: Secondary | ICD-10-CM | POA: Diagnosis not present

## 2022-01-30 ENCOUNTER — Other Ambulatory Visit: Payer: Self-pay | Admitting: Internal Medicine

## 2022-01-30 ENCOUNTER — Ambulatory Visit: Payer: Self-pay

## 2022-01-30 NOTE — Patient Outreach (Signed)
  Care Coordination   Follow Up Visit Note   01/30/2022 Name: [name] MRN: [id number] DOB: 29-Sep-1945  [name] is a 76 y.o. year old female who sees Jinny Sanders, MD for primary care. I spoke with  [name] by phone today.  What matters to the patients health and wellness today?  Patient states she had all of her heart tests completed and was scheduled to follow up with the cardiologist this month. She states the cardiology office called and rescheduled her appointment for February 20, 2022.  Patient states she was very disappointment because she was ready to get the results of her test.     Goals Addressed             This Visit's Progress    Patient Stated:  Managing Health conditions       Care Coordination Interventions: Reviewed medication changes and discussed importance of compliance Advised to notify provider of any new / ongoing symptoms Reviewed scheduled/ upcoming appointments Active listening/ support             SDOH assessments and interventions completed:  No     Care Coordination Interventions:  Yes, provided   Follow up plan: Follow up call scheduled for 02/21/22    Encounter Outcome:  Pt. Visit Completed   Quinn Plowman RN,BSN,CCM [place] [phone] direct line

## 2022-02-11 ENCOUNTER — Ambulatory Visit
Admission: EM | Admit: 2022-02-11 | Discharge: 2022-02-11 | Disposition: A | Discharge status: Home / Self Care | Payer: Medicare HMO | Attending: Emergency Medicine | Admitting: Emergency Medicine

## 2022-02-11 DIAGNOSIS — N390 Urinary tract infection, site not specified: Secondary | ICD-10-CM | POA: Diagnosis not present

## 2022-02-11 LAB — POCT URINALYSIS DIP (MANUAL ENTRY)
Bilirubin, UA: NEGATIVE
Glucose, UA: 500 mg/dL — AB
Ketones, POC UA: NEGATIVE mg/dL
Nitrite, UA: POSITIVE — AB
Protein Ur, POC: NEGATIVE mg/dL
Spec Grav, UA: 1.01 (ref 1.010–1.025)
Urobilinogen, UA: 0.2 E.U./dL
pH, UA: 5.5 (ref 5.0–8.0)

## 2022-02-11 MED ORDER — CEPHALEXIN 500 MG PO CAPS: 500.0000 mg | ORAL_CAPSULE | Freq: Two times a day (BID) | ORAL | 0 refills | Status: AC

## 2022-02-11 NOTE — Discharge Instructions (Addendum)
Take the antibiotic as directed.  The urine culture is pending.  We will call you if it shows the need to change or discontinue your antibiotic.    Follow up with your primary care provider or urologist if your symptoms are not improving.     

## 2022-02-11 NOTE — ED Provider Notes (Signed)
[name]    CSN: [id number] Arrival date & time: 02/11/22  1550      History   Chief Complaint Chief Complaint  Patient presents with   Urinary Frequency    HPI [name] is a 76 y.o. female.  Patient presents with dysuria and urinary frequency x 1 day.  She denies fever, chills, abdominal pain, flank pain, vaginal discharge, pelvic pain, or other symptoms.  No treatments at home.  She reports history of UTIs and is followed by [facility].  Her medical history includes diabetes and hypertension.  The history is provided by the patient and medical records.    Past Medical History:  Diagnosis Date   Anemia    Anxiety    Atrial fibrillation (HCC)    Colon polyps    Constipation    Depressive disorder, not elsewhere classified    Diabetes mellitus without complication ([place])    Diaphragmatic hernia without mention of obstruction or gangrene    Disorders of bursae and tendons in shoulder region, unspecified    Diverticulosis of colon (without mention of hemorrhage)    External hemorrhoid    Fundic gland polyps of stomach, benign    GERD (gastroesophageal reflux disease)    History of hiatal hernia    HTN (hypertension)    Hypertension    Hypothyroidism    Internal hemorrhoids without mention of complication    Moderate mitral regurgitation    Obesity, unspecified    OSA on CPAP    compliant with CPAP   Other urinary incontinence    Persistent atrial fibrillation ([place])    Pure hypercholesterolemia    Type II diabetes mellitus ([place])    Urinary frequency    Urinary urgency    Vitamin B 12 deficiency     Patient Active Problem List   Diagnosis Date Noted   GAD (generalized anxiety disorder) 10/26/2021   Chronic insomnia 10/26/2021   Closed fracture of metacarpal bone 02/27/2021   Intractable acute post-traumatic headache 02/20/2021   Secondary hypercoagulable state ([place]) 11/27/2020   Atrial tachycardia 09/15/2018   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    Status post total hip replacement, 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   Lumbar radiculopathy 01/05/2018   Vitamin B 12 deficiency    Urge incontinence    OSA on CPAP    History of hiatal hernia    GERD (gastroesophageal reflux disease)    Acute constipation 01/02/2017   Class 1 obesity with serious comorbidity and body mass index (BMI) of 30.0 to 30.9 in adult 01/02/2017   Overweight (BMI 25.0-29.9) 12/19/2016   Persistent atrial fibrillation ([place]) 12/17/2016   Vitamin D deficiency 11/20/2016   Family history of ovarian cancer 05/24/2016   Iron deficiency anemia    Hypertension associated with diabetes ([place])    Preoperative evaluation to rule out surgical contraindication 05/16/2015   Controlled diabetes mellitus type II without complication ([place]) 37/85/8850   Memory loss 05/13/2014   Mitral regurgitation 10/11/2013   Incidental lung nodule 07/16/2011   SCIATICA, RIGHT 04/10/2010   Hyperlipidemia associated with type 2 diabetes mellitus ([place]) 06/11/2006   MDD (major depressive disorder), recurrent episode, 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 [name]"   CARDIOVERSION N/A 05/27/2016   Procedure: CARDIOVERSION;  Surgeon: [name], MD;  Location: [facility];  Service: Cardiovascular;  Laterality: N/A;   CARDIOVERSION N/A 11/25/2016   Procedure: CARDIOVERSION;  Surgeon: [name], MD;  Location: [facility];  Service: Cardiovascular;  Laterality: N/A;   CARDIOVERSION N/A 09/18/2018   Procedure:  CARDIOVERSION;  Surgeon: [name], MD;  Location: [facility];  Service: Cardiovascular;  Laterality: N/A;   CARDIOVERSION N/A 07/28/2019   Procedure: CARDIOVERSION;  Surgeon: Kate Sable, MD;  Location: [facility];  Service: Cardiovascular;  Laterality: N/A;   CARDIOVERSION N/A 09/07/2019   Procedure: CARDIOVERSION;  Surgeon: Nelva Bush, MD;  Location: [facility];  Service: Cardiovascular;  Laterality: N/A;   CATARACT EXTRACTION W/ INTRAOCULAR LENS  IMPLANT, BILATERAL Bilateral    COLONOSCOPY     ELECTROPHYSIOLOGIC STUDY N/A 01/24/2016   Procedure: CARDIOVERSION;  Surgeon: [name], MD;  Location: [facility];  Service: Cardiovascular;  Laterality: N/A;   implantable loop recorder implantation  10/18/2019   [employer]tronic Reveal [place] model [phone]) implantable loop recorder    LAPAROSCOPIC CHOLECYSTECTOMY     REDUCTION MAMMAPLASTY Bilateral    TONSILLECTOMY     TOTAL HIP ARTHROPLASTY Right 05/12/2018   Procedure: TOTAL HIP ARTHROPLASTY ANTERIOR APPROACH;  Surgeon: Paralee Cancel, MD;  Location: [facility];  Service: Orthopedics;  Laterality: Right;  70 mins    OB History     Gravida  2   Para  0   Term  0   Preterm  0   AB  0   Living         SAB  0   IAB  0   Ectopic  0   Multiple      Live Births               Home Medications    Prior to Admission medications   Medication Sig Start Date End Date Taking? Authorizing Provider  cephALEXin (KEFLEX) 500 MG capsule Take 1 capsule (500 mg total) by mouth 2 (two) times daily for 5 days. 02/11/22 02/16/22 Yes Sharion Balloon, NP  apixaban (ELIQUIS) 5 MG TABS tablet TAKE ONE TABLET BY MOUTH TWICE DAILY 11/15/21   [name], MD  Ascorbic Acid (VITAMIN C PO) Take 1 tablet by mouth at bedtime.    [provider]  atorvastatin (LIPITOR) 10 MG tablet TAKE ONE TABLET BY MOUTH AT BEDTIME 12/27/21   Bedsole, Amy E, MD  calcium carbonate (OS-CAL - DOSED IN MG OF ELEMENTAL CALCIUM) 1250 (500 Ca)  MG tablet Take 1 tablet by mouth daily with breakfast.    [provider]  dapagliflozin propanediol (FARXIGA) 5 MG TABS tablet Take 1 tablet (5 mg total) by mouth daily before breakfast. 11/28/21   [name], MD  dofetilide (TIKOSYN) 250 MCG capsule TAKE 1 CAPSULE BY MOUTH TWO TIMES DAILY 11/29/21   Deboraha Sprang, MD  doxylamine, Sleep, (UNISOM) 25 MG tablet Take 25 mg by mouth at bedtime as needed.    [provider]  hydroxypropyl methylcellulose / hypromellose (ISOPTO TEARS / GONIOVISC) 2.5 % ophthalmic solution Place 1 drop into both eyes 2 (two) times daily as needed for dry eyes.    [provider]  linagliptin (TRADJENTA) 5 MG TABS tablet TAKE 1 TABLET BY MOUTH  DAILY 01/30/22   [name], MD  magnesium oxide (MAG-OX) 400 MG tablet Take 1 tablet (400 mg total) by mouth 2 (two) times daily. 05/11/21   Shirley Friar, PA-C  metFORMIN (GLUCOPHAGE-XR) 500 MG 24 hr tablet TAKE TWO TABLETS BY MOUTH TWICE DAILY WITH MEALS 12/27/21   [name], MD  metoprolol succinate (TOPROL-XL) 25 MG 24 hr tablet Take one tablet by mouth at bedtime 11/21/21   Dunn, Ryan M, PA-C  MYRBETRIQ 50 MG TB24 tablet Take 50 mg by mouth daily. 11/27/20   [provider]  ONETOUCH ULTRA test strip CHECK BLOOD SUGAR 2 TIMES A DAY AS DIRECTED DX CODE E11.9 08/24/20   [name], MD  sertraline (ZOLOFT) 100 MG tablet Take 1 tablet (100 mg total) by mouth at bedtime. 12/04/21   Jinny Sanders, MD    Family History Family History  Problem Relation Age of Onset   Cancer Mother        fallopian tube   Dementia Mother    Pulmonary embolism Mother    Hyperlipidemia Mother    Diabetes Mother    Hyperlipidemia Father    Lung cancer Father    Hypertension Sister    Diabetes Brother    Cancer Brother    Stroke Paternal Grandmother    Stroke Paternal Grandfather    Kidney cancer Son    Dementia Maternal Aunt    Colon cancer Neg Hx    Esophageal cancer  Neg Hx     Social History Social History   Tobacco Use   Smoking status: Never   Smokeless tobacco: Never   Tobacco comments:    Never smoke 08/30/21  Vaping Use   Vaping Use: Never used  Substance Use Topics   Alcohol use: Yes    Comment: a little bit of wine at bedtime to help sleep   Drug use: No     Allergies   Cortisone, Pravachol [pravastatin sodium], and Contrast media [iodinated contrast media]   Review of Systems Review of Systems  Constitutional:  Negative for chills and fever.  Gastrointestinal:  Negative for abdominal pain, diarrhea, nausea and vomiting.  Genitourinary:  Positive for dysuria and frequency. Negative for hematuria, pelvic pain and vaginal discharge.  All other systems reviewed and are negative.    Physical Exam Triage Vital Signs ED Triage Vitals [02/11/22 1606]  Enc Vitals Group     BP      Pulse      Resp      Temp      Temp src      SpO2      Weight      Height      Head Circumference      Peak Flow      Pain Score 0     Pain Loc      Pain Edu?      Excl. in [place]?    No data found.  Updated Vital Signs BP 119/85 (BP Location: Left Arm)   Pulse 68   Temp 98 F (36.7 C) (Oral)   Resp 20   LMP  (LMP Unknown)   SpO2 96%   Visual Acuity Right Eye Distance:   Left Eye Distance:   Bilateral Distance:    Right Eye Near:   Left Eye Near:    Bilateral Near:     Physical Exam Vitals and nursing note reviewed.  Constitutional:      General: She is not in acute distress.  Appearance: Normal appearance. She is well-developed. She is not ill-appearing.  HENT:     Mouth/Throat:     Mouth: Mucous membranes are moist.  Cardiovascular:     Rate and Rhythm: Normal rate and regular rhythm.     Heart sounds: Normal heart sounds.  Pulmonary:     Effort: Pulmonary effort is normal. No respiratory distress.     Breath sounds: Normal breath sounds.  Abdominal:     General: Bowel sounds are normal.     Palpations: Abdomen is  soft.     Tenderness: There is no abdominal tenderness. There is no right CVA tenderness, left CVA tenderness, guarding or rebound.  Musculoskeletal:     Cervical back: Neck supple.  Skin:    General: Skin is warm and dry.  Neurological:     Mental Status: She is alert.  Psychiatric:        Mood and Affect: Mood normal.        Behavior: Behavior normal.      UC Treatments / Results  Labs (all labs ordered are listed, but only abnormal results are displayed) Labs Reviewed  POCT URINALYSIS DIP (MANUAL ENTRY) - Abnormal; Notable for the following components:      Result Value   Color, UA colorless (*)    Glucose, UA =500 (*)    Blood, UA trace-intact (*)    Nitrite, UA Positive (*)    Leukocytes, UA Trace (*)    All other components within normal limits  URINE CULTURE    EKG   Radiology No results found.  Procedures Procedures (including critical care time)  Medications Ordered in UC Medications - No data to display  Initial Impression / Assessment and Plan / UC Course  I have reviewed the triage vital signs and the nursing notes.  Pertinent labs & imaging results that were available during my care of the patient were reviewed by me and considered in my medical decision making (see chart for details).    UTI.  Treating with Keflex. Urine culture pending. Discussed with patient that we will call her if the urine culture shows the need to change or discontinue the antibiotic. Instructed her to follow-up with her PCP or urologist if her symptoms are not improving. Patient agrees to plan of care.     Final Clinical Impressions(s) / UC Diagnoses   Final diagnoses:  Urinary tract infection without hematuria, site unspecified     Discharge Instructions      Take the antibiotic as directed.  The urine culture is pending.  We will call you if it shows the need to change or discontinue your antibiotic.    Follow up with your primary care provider or urologist if your  symptoms are not improving.        ED Prescriptions     Medication Sig Dispense Auth. Provider   cephALEXin (KEFLEX) 500 MG capsule Take 1 capsule (500 mg total) by mouth 2 (two) times daily for 5 days. 10 capsule Sharion Balloon, NP      PDMP not reviewed this encounter.   Sharion Balloon, NP 02/11/22 [phone]

## 2022-02-11 NOTE — ED Triage Notes (Signed)
Pt presents with urinary frequency and dysuria x 1 day.  Also noticed fatigue, weakness in the last day.

## 2022-02-13 LAB — URINE CULTURE: Culture: >=100000 — AB

## 2022-02-18 ENCOUNTER — Other Ambulatory Visit: Payer: Self-pay

## 2022-02-18 ENCOUNTER — Emergency Department: Payer: Medicare HMO

## 2022-02-18 ENCOUNTER — Observation Stay
Admission: EM | Admit: 2022-02-18 | Discharge: 2022-02-19 | Disposition: A | Discharge status: Home / Self Care | Payer: Medicare HMO | Attending: Internal Medicine | Admitting: Internal Medicine

## 2022-02-18 ENCOUNTER — Ambulatory Visit (INDEPENDENT_AMBULATORY_CARE_PROVIDER_SITE_OTHER): Payer: Medicare HMO

## 2022-02-18 DIAGNOSIS — Z1152 Encounter for screening for COVID-19: Secondary | ICD-10-CM | POA: Insufficient documentation

## 2022-02-18 DIAGNOSIS — I4819 Other persistent atrial fibrillation: Principal | ICD-10-CM | POA: Insufficient documentation

## 2022-02-18 DIAGNOSIS — E119 Type 2 diabetes mellitus without complications: Secondary | ICD-10-CM

## 2022-02-18 DIAGNOSIS — Z7901 Long term (current) use of anticoagulants: Secondary | ICD-10-CM | POA: Diagnosis not present

## 2022-02-18 DIAGNOSIS — E669 Obesity, unspecified: Secondary | ICD-10-CM

## 2022-02-18 DIAGNOSIS — E039 Hypothyroidism, unspecified: Secondary | ICD-10-CM | POA: Insufficient documentation

## 2022-02-18 DIAGNOSIS — R0600 Dyspnea, unspecified: Secondary | ICD-10-CM | POA: Diagnosis not present

## 2022-02-18 DIAGNOSIS — Z7984 Long term (current) use of oral hypoglycemic drugs: Secondary | ICD-10-CM | POA: Insufficient documentation

## 2022-02-18 DIAGNOSIS — R69 Illness, unspecified: Secondary | ICD-10-CM | POA: Diagnosis not present

## 2022-02-18 DIAGNOSIS — E785 Hyperlipidemia, unspecified: Secondary | ICD-10-CM | POA: Diagnosis present

## 2022-02-18 DIAGNOSIS — R0602 Shortness of breath: Secondary | ICD-10-CM | POA: Insufficient documentation

## 2022-02-18 DIAGNOSIS — R079 Chest pain, unspecified: Secondary | ICD-10-CM | POA: Diagnosis not present

## 2022-02-18 DIAGNOSIS — R002 Palpitations: Secondary | ICD-10-CM | POA: Diagnosis present

## 2022-02-18 DIAGNOSIS — Z79899 Other long term (current) drug therapy: Secondary | ICD-10-CM | POA: Diagnosis not present

## 2022-02-18 DIAGNOSIS — I4891 Unspecified atrial fibrillation: Secondary | ICD-10-CM

## 2022-02-18 DIAGNOSIS — I10 Essential (primary) hypertension: Secondary | ICD-10-CM | POA: Insufficient documentation

## 2022-02-18 DIAGNOSIS — E66811 Obesity, class 1: Secondary | ICD-10-CM

## 2022-02-18 DIAGNOSIS — F32A Depression, unspecified: Secondary | ICD-10-CM | POA: Diagnosis present

## 2022-02-18 DIAGNOSIS — Z96641 Presence of right artificial hip joint: Secondary | ICD-10-CM | POA: Insufficient documentation

## 2022-02-18 LAB — CBC WITH DIFFERENTIAL/PLATELET
Abs Immature Granulocytes: 0.02 10*3/uL (ref 0.00–0.07)
Basophils Absolute: 0.1 10*3/uL (ref 0.0–0.1)
Basophils Relative: 1 %
Eosinophils Absolute: 0.2 10*3/uL (ref 0.0–0.5)
Eosinophils Relative: 3 %
HCT: 37.7 % (ref 36.0–46.0)
Hemoglobin: 11.7 g/dL — ABNORMAL LOW (ref 12.0–15.0)
Immature Granulocytes: 0 %
Lymphocytes Relative: 32 %
Lymphs Abs: 2.2 10*3/uL (ref 0.7–4.0)
MCH: 27.6 pg (ref 26.0–34.0)
MCHC: 31 g/dL (ref 30.0–36.0)
MCV: 88.9 fL (ref 80.0–100.0)
Monocytes Absolute: 0.6 10*3/uL (ref 0.1–1.0)
Monocytes Relative: 8 %
Neutro Abs: 3.8 10*3/uL (ref 1.7–7.7)
Neutrophils Relative %: 56 %
Platelets: 391 10*3/uL (ref 150–400)
RBC: 4.24 MIL/uL (ref 3.87–5.11)
RDW: 14.2 % (ref 11.5–15.5)
WBC: 6.8 10*3/uL (ref 4.0–10.5)
nRBC: 0 % (ref 0.0–0.2)

## 2022-02-18 LAB — COMPREHENSIVE METABOLIC PANEL
ALT: 15 U/L (ref 0–44)
AST: 24 U/L (ref 15–41)
Albumin: 4.1 g/dL (ref 3.5–5.0)
Alkaline Phosphatase: 79 U/L (ref 38–126)
Anion gap: 9 (ref 5–15)
BUN: 20 mg/dL (ref 8–23)
CO2: 24 mmol/L (ref 22–32)
Calcium: 9.3 mg/dL (ref 8.9–10.3)
Chloride: 102 mmol/L (ref 98–111)
Creatinine, Ser: 1.19 mg/dL — ABNORMAL HIGH (ref 0.44–1.00)
GFR, Estimated: 48 mL/min — ABNORMAL LOW (ref >60)
Glucose, Bld: 220 mg/dL — ABNORMAL HIGH (ref 70–99)
Potassium: 4.1 mmol/L (ref 3.5–5.1)
Sodium: 135 mmol/L (ref 135–145)
Total Bilirubin: 0.6 mg/dL (ref 0.3–1.2)
Total Protein: 7.6 g/dL (ref 6.5–8.1)

## 2022-02-18 LAB — RESP PANEL BY RT-PCR (RSV, FLU A&B, COVID)  RVPGX2
Influenza A by PCR: NEGATIVE
Influenza B by PCR: NEGATIVE
Resp Syncytial Virus by PCR: NEGATIVE
SARS Coronavirus 2 by RT PCR: NEGATIVE

## 2022-02-18 LAB — MAGNESIUM: Magnesium: 2.1 mg/dL (ref 1.7–2.4)

## 2022-02-18 LAB — TROPONIN I (HIGH SENSITIVITY)
Troponin I (High Sensitivity): 11 ng/L (ref <18)
Troponin I (High Sensitivity): 18 ng/L — ABNORMAL HIGH (ref <18)

## 2022-02-18 LAB — TSH: TSH: 3.291 u[IU]/mL (ref 0.350–4.500)

## 2022-02-18 LAB — T4, FREE: Free T4: 0.74 ng/dL (ref 0.61–1.12)

## 2022-02-18 MED ORDER — ADENOSINE 6 MG/2ML IV SOLN
6.0000 mg | Freq: Once | INTRAVENOUS | Status: DC
  Filled 2022-02-18: qty 2

## 2022-02-18 MED ORDER — METOPROLOL TARTRATE 5 MG/5ML IV SOLN
5.0000 mg | Freq: Once | INTRAVENOUS | Status: AC
  Administered 2022-02-18: 5 mg via INTRAVENOUS

## 2022-02-18 MED ORDER — METOPROLOL TARTRATE 5 MG/5ML IV SOLN
INTRAVENOUS | Status: AC
  Filled 2022-02-18: qty 5

## 2022-02-18 MED ORDER — METOPROLOL TARTRATE 5 MG/5ML IV SOLN
INTRAVENOUS | Status: AC
  Administered 2022-02-18: 5 mg via INTRAVENOUS
  Filled 2022-02-18: qty 5

## 2022-02-18 MED ORDER — ADENOSINE 12 MG/4ML IV SOLN
INTRAVENOUS | Status: AC
  Filled 2022-02-18: qty 4

## 2022-02-18 MED ORDER — METOPROLOL TARTRATE 5 MG/5ML IV SOLN: 5.0000 mg | Freq: Once | INTRAVENOUS | Status: AC

## 2022-02-18 MED ORDER — METOPROLOL TARTRATE 50 MG PO TABS
50.0000 mg | ORAL_TABLET | Freq: Once | ORAL | Status: AC
  Administered 2022-02-18: 50 mg via ORAL
  Filled 2022-02-18: qty 1

## 2022-02-18 MED ORDER — METOPROLOL TARTRATE 5 MG/5ML IV SOLN
5.0000 mg | Freq: Once | INTRAVENOUS | Status: AC
  Administered 2022-02-18: 5 mg via INTRAVENOUS
  Filled 2022-02-18: qty 5

## 2022-02-18 MED ORDER — SODIUM CHLORIDE 0.9 % IV BOLUS
1000.0000 mL | Freq: Once | INTRAVENOUS | Status: AC
  Administered 2022-02-18: 1000 mL via INTRAVENOUS

## 2022-02-18 NOTE — ED Notes (Signed)
Pt states CP has been about the same since arrival to ER. Pt's resp reg/unlabored, skin dry and laying calmly on stretcher.

## 2022-02-18 NOTE — ED Notes (Signed)
Pt given pair of paper scrubs as pt requested to urinate and bed found to be wet. Pt provided self with peri care, linens changed, and pt repositioned on stretcher.

## 2022-02-18 NOTE — ED Notes (Addendum)
Pt brought back to rm 11 via wc. Pt placed into a hospital gown and placed on cardiac monitor. Call light within reach. Bed in lowest position. Metta Clines, RN made aware of pt in rm.

## 2022-02-18 NOTE — ED Triage Notes (Signed)
To triage via wheelchair with c/o erratic heart rate. Rate up to 185 at home. Hx of Afib. Also c/o midsternal chest pain.  Provider in triage for further eval at this time.

## 2022-02-18 NOTE — ED Notes (Signed)
EDP Wong back to bedside.

## 2022-02-18 NOTE — ED Notes (Signed)
Verbal from provider S. Wong to give 2nd dose of '5mg'$  metoprolol IV if HR sustains above 105. HR currently fluctuating between 95 and 115.

## 2022-02-18 NOTE — ED Notes (Signed)
Code cart to bedside and pt placed on pads. SVT 160-170.

## 2022-02-18 NOTE — ED Notes (Signed)
Pt flipped into Afib at 100-120 before meds given.

## 2022-02-18 NOTE — ED Notes (Signed)
Pt given warm blanket x2.

## 2022-02-18 NOTE — ED Notes (Signed)
EDP Jacelyn Grip at bedside. Jacelyn Grip talking about options with pt.

## 2022-02-18 NOTE — ED Notes (Signed)
NS bolus switched to run through L ac IV.

## 2022-02-18 NOTE — ED Notes (Signed)
Pt states this has happened multiple times before. States last time took 2 shocks to fix rhythm. Pt states very mild CP. Denies any other pain currently. Resp reg/unlabored, skin dry and calmly laying on stretcher. Visitors at bedside. Pt states would like to try med via IV before shock this time.

## 2022-02-18 NOTE — ED Provider Notes (Signed)
[facility] Provider Note    Event Date/Time   First MD Initiated Contact with Patient 02/18/22 2029     (approximate)   History   No chief complaint on file.   HPI  MARGO LAMA is a 76 y.o. female   Past medical history of atrial fibrillation on Eliquis/metoprolol/dofetilide, hypertension, hypothyroid, diabetes, presents to the emergency department with palpitations starting this afternoon mild chest pain, exertional dyspnea.  Was in her regular state of health prior.  Has been compliant with all her medications.  No other acute medical complaints.  History was obtained via the patient.  Her family members are at bedside as independent historians to corroborate information as given above.      Physical Exam   Triage Vital Signs: ED Triage Vitals  Enc Vitals Group     BP 02/18/22 2021 (!) 122/96     Pulse Rate 02/18/22 2021 (!) 17     Resp 02/18/22 2021 18     Temp 02/18/22 2024 98.2 F (36.8 C)     Temp Source 02/18/22 2024 Oral     SpO2 02/18/22 2024 98 %     Weight 02/18/22 2019 175 lb (79.4 kg)     Height 02/18/22 2019 '5\' 5"'$  (1.651 m)     Head Circumference --      Peak Flow --      Pain Score 02/18/22 2019 5     Pain Loc --      Pain Edu? --      Excl. in [place]? --     Most recent vital signs: Vitals:   02/18/22 2307 02/18/22 2308  BP:    Pulse:  (!) 131  Resp: 20 (!) 21  Temp:    SpO2: 99% 100%    General: Awake, no distress.  CV:  Good peripheral perfusion.  Resp:  Normal effort.  Abd:  No distention.  Other:  Tachycardia 150s to 170s normal tension, speaking full sentences awake alert oriented and pleasant.  Lungs clear to auscultation bilateral without focality wheezing or rales.  Appears euvolemic.   ED Results / Procedures / Treatments   Labs (all labs ordered are listed, but only abnormal results are displayed) Labs Reviewed  COMPREHENSIVE METABOLIC PANEL - Abnormal; Notable for the following components:       Result Value   Glucose, Bld 220 (*)    Creatinine, Ser 1.19 (*)    GFR, Estimated 48 (*)    All other components within normal limits  CBC WITH DIFFERENTIAL/PLATELET - Abnormal; Notable for the following components:   Hemoglobin 11.7 (*)    All other components within normal limits  RESP PANEL BY RT-PCR (RSV, FLU A&B, COVID)  [id number]  URINALYSIS, ROUTINE W REFLEX MICROSCOPIC  TSH  T4, FREE  MAGNESIUM  TROPONIN I (HIGH SENSITIVITY)  TROPONIN I (HIGH SENSITIVITY)     I reviewed labs and they are notable for troponin is negative.  K wnl.  EKG  ED ECG REPORT I, Lucillie Garfinkel, the attending physician, personally viewed and interpreted this ECG.   Date: 02/18/2022  EKG Time: 2105  Rate: 156  Rhythm: atrial fibrillation, rate 150s  Axis: nl  Intervals:none  ST&T Change: No acute ischemic changes    RADIOLOGY I independently reviewed and interpreted chest x-ray and see no obvious focal consolidations or pneumothorax   PROCEDURES:  Critical Care performed: Yes, see critical care procedure note(s)  .Critical Care  Performed by: Lucillie Garfinkel, MD Authorized by: Jacelyn Grip,  Purcell Nails, MD   Critical care provider statement:    Critical care time (minutes):  30   Critical care was necessary to treat or prevent imminent or life-threatening deterioration of the following conditions: Atrial fibrillation RVR requiring IV medications.   Critical care was time spent personally by me on the following activities:  Development of treatment plan with patient or surrogate, discussions with consultants, evaluation of patient's response to treatment, examination of patient, ordering and review of laboratory studies, ordering and review of radiographic studies, ordering and performing treatments and interventions, pulse oximetry, re-evaluation of patient's condition and review of old charts    [facility] ED: Medications  adenosine (ADENOCARD) 6 MG/2ML injection 6 mg (0 mg Intravenous Hold  02/18/22 2131)  adenosine (ADENOCARD) 12 MG/4ML injection (0 mg  Hold 02/18/22 2132)  metoprolol tartrate (LOPRESSOR) 5 MG/5ML injection (  Not Given 02/18/22 2131)  metoprolol tartrate (LOPRESSOR) injection 5 mg (has no administration in time range)  sodium chloride 0.9 % bolus 1,000 mL (0 mLs Intravenous Stopped 02/18/22 2143)  metoprolol tartrate (LOPRESSOR) injection 5 mg (5 mg Intravenous Given 02/18/22 2108)  metoprolol tartrate (LOPRESSOR) injection 5 mg (5 mg Intravenous Given 02/18/22 2127)  metoprolol tartrate (LOPRESSOR) tablet 50 mg (50 mg Oral Given 02/18/22 2142)    Consultants:  I spoke with hospitalist for admission and regarding care plan for this patient.   IMPRESSION / MDM / ASSESSMENT AND PLAN / ED COURSE  I reviewed the triage vital signs and the nursing notes.                              Differential diagnosis includes, but is not limited to, atrial fibrillation with RVR, electrolyte derangements, thyroid dysfunction, ACS, infection   The patient is on the cardiac monitor to evaluate for evidence of arrhythmia and/or significant heart rate changes.  MDM: With atrial fibrillation and RVR that got to x 5 mg IV metoprolol with good rate control, remained normotensive throughout.  She got p.o. metoprolol 50 mg afterwards.  While monitoring in the emergency department she became tachycardic once more to the 130s and remained normotensive.  She was given another IV metoprolol 5 mg.  She was admitted to the hospitalist service.   Patient's presentation is most consistent with acute presentation with potential threat to life or bodily function.       FINAL CLINICAL IMPRESSION(S) / ED DIAGNOSES   Final diagnoses:  Atrial fibrillation with RVR ([place])     Rx / DC Orders   ED Discharge Orders     None        Note:  This document was prepared using Dragon voice recognition software and may include unintentional dictation errors.    Lucillie Garfinkel,  MD 02/18/22 [phone]

## 2022-02-18 NOTE — ED Notes (Signed)
Pt attempted to notify this RN of need to use restroom but unable to get up to restroom soon enough as needed to move stretcher as close to restroom as possible for safety. Pt steady upon ambulation with this RN and daughter walking side-by-side with pt. Pt attempting to provide urine sample. Will send sample if pt able to provide. Hat in toilet.

## 2022-02-18 NOTE — ED Provider Triage Note (Addendum)
Emergency Medicine Provider Triage Evaluation Note  JESSCIA IMM , a 76 y.o. female  was evaluated in triage.  Pt complains of palpitation/heart racing. HX afib. This has happened in the past. Has had cardioversion and ablation in the past. No other symptoms. Just finished abx for UTI  Review of Systems  Positive: Heart racing, CP Negative: Shob, abd pain, dysuria, flank pain  Physical Exam  LMP  (LMP Unknown)  Gen:   Awake, no distress   Resp:  Normal effort  MSK:   Moves extremities without difficulty  Other:    Medical Decision Making  Medically screening exam initiated at 8:18 PM.  Appropriate orders placed.  Elesa Garman Bath was informed that the remainder of the evaluation will be completed by another provider, this initial triage assessment does not replace that evaluation, and the importance of remaining in the ED until their evaluation is complete.  Labs, EKG, chest xray, urinalysis   EKG at 174. Appears to SVT as there is no variation of HR.  Patient is roomed immediately   Darletta Moll, PA-C 02/18/22 2022    Darletta Moll, PA-C 02/18/22 2025

## 2022-02-19 ENCOUNTER — Other Ambulatory Visit: Payer: Self-pay

## 2022-02-19 ENCOUNTER — Encounter: Payer: Self-pay | Admitting: Family Medicine

## 2022-02-19 ENCOUNTER — Encounter
Admission: EM | Disposition: A | Discharge status: Home / Self Care | Payer: Self-pay | Source: Home / Self Care | Attending: Emergency Medicine

## 2022-02-19 ENCOUNTER — Inpatient Hospital Stay: Payer: Medicare HMO | Admitting: Anesthesiology

## 2022-02-19 DIAGNOSIS — I10 Essential (primary) hypertension: Secondary | ICD-10-CM

## 2022-02-19 DIAGNOSIS — I4819 Other persistent atrial fibrillation: Secondary | ICD-10-CM | POA: Diagnosis not present

## 2022-02-19 DIAGNOSIS — E039 Hypothyroidism, unspecified: Secondary | ICD-10-CM | POA: Diagnosis not present

## 2022-02-19 DIAGNOSIS — R69 Illness, unspecified: Secondary | ICD-10-CM | POA: Diagnosis not present

## 2022-02-19 DIAGNOSIS — E119 Type 2 diabetes mellitus without complications: Secondary | ICD-10-CM

## 2022-02-19 DIAGNOSIS — F32A Depression, unspecified: Secondary | ICD-10-CM | POA: Diagnosis present

## 2022-02-19 DIAGNOSIS — N3001 Acute cystitis with hematuria: Secondary | ICD-10-CM | POA: Diagnosis not present

## 2022-02-19 DIAGNOSIS — E785 Hyperlipidemia, unspecified: Secondary | ICD-10-CM | POA: Diagnosis present

## 2022-02-19 DIAGNOSIS — I4891 Unspecified atrial fibrillation: Secondary | ICD-10-CM | POA: Diagnosis not present

## 2022-02-19 DIAGNOSIS — F418 Other specified anxiety disorders: Secondary | ICD-10-CM | POA: Diagnosis not present

## 2022-02-19 HISTORY — PX: CARDIOVERSION: SHX1299

## 2022-02-19 LAB — URINALYSIS, ROUTINE W REFLEX MICROSCOPIC
Bilirubin Urine: NEGATIVE
Glucose, UA: >=500 mg/dL — AB
Ketones, ur: NEGATIVE mg/dL
Nitrite: NEGATIVE
Protein, ur: NEGATIVE mg/dL
Specific Gravity, Urine: 1.009 (ref 1.005–1.030)
pH: 5 (ref 5.0–8.0)

## 2022-02-19 LAB — CUP PACEART REMOTE DEVICE CHECK
Date Time Interrogation Session: 20231217232027
Implantable Pulse Generator Implant Date: 20210816

## 2022-02-19 LAB — BASIC METABOLIC PANEL
Anion gap: 4 — ABNORMAL LOW (ref 5–15)
BUN: 21 mg/dL (ref 8–23)
CO2: 23 mmol/L (ref 22–32)
Calcium: 9.1 mg/dL (ref 8.9–10.3)
Chloride: 111 mmol/L (ref 98–111)
Creatinine, Ser: 0.89 mg/dL (ref 0.44–1.00)
GFR, Estimated: >60 mL/min (ref >60)
Glucose, Bld: 205 mg/dL — ABNORMAL HIGH (ref 70–99)
Potassium: 4.5 mmol/L (ref 3.5–5.1)
Sodium: 138 mmol/L (ref 135–145)

## 2022-02-19 LAB — CBG MONITORING, ED
Glucose-Capillary: 134 mg/dL — ABNORMAL HIGH (ref 70–99)
Glucose-Capillary: 156 mg/dL — ABNORMAL HIGH (ref 70–99)

## 2022-02-19 LAB — CBC
HCT: 34 % — ABNORMAL LOW (ref 36.0–46.0)
Hemoglobin: 10.5 g/dL — ABNORMAL LOW (ref 12.0–15.0)
MCH: 27.1 pg (ref 26.0–34.0)
MCHC: 30.9 g/dL (ref 30.0–36.0)
MCV: 87.9 fL (ref 80.0–100.0)
Platelets: 336 10*3/uL (ref 150–400)
RBC: 3.87 MIL/uL (ref 3.87–5.11)
RDW: 14.2 % (ref 11.5–15.5)
WBC: 6.5 10*3/uL (ref 4.0–10.5)
nRBC: 0 % (ref 0.0–0.2)

## 2022-02-19 SURGERY — CARDIOVERSION
Anesthesia: General

## 2022-02-19 MED ORDER — MAGNESIUM HYDROXIDE 400 MG/5ML PO SUSP: 30.0000 mL | Freq: Every day | ORAL | Status: DC | PRN

## 2022-02-19 MED ORDER — ONDANSETRON HCL 4 MG/2ML IJ SOLN: 4.0000 mg | Freq: Four times a day (QID) | INTRAMUSCULAR | Status: DC | PRN

## 2022-02-19 MED ORDER — ZOLPIDEM TARTRATE 5 MG PO TABS: 5.0000 mg | ORAL_TABLET | Freq: Every day | ORAL | Status: DC

## 2022-02-19 MED ORDER — DOFETILIDE 250 MCG PO CAPS: 250.0000 ug | ORAL_CAPSULE | Freq: Two times a day (BID) | ORAL | Status: DC

## 2022-02-19 MED ORDER — DAPAGLIFLOZIN PROPANEDIOL 5 MG PO TABS
5.0000 mg | ORAL_TABLET | Freq: Every day | ORAL | Status: DC
  Filled 2022-02-19: qty 1

## 2022-02-19 MED ORDER — TRAZODONE HCL 50 MG PO TABS: 25.0000 mg | ORAL_TABLET | Freq: Every evening | ORAL | Status: DC | PRN

## 2022-02-19 MED ORDER — MAGNESIUM OXIDE -MG SUPPLEMENT 400 (240 MG) MG PO TABS: 400.0000 mg | ORAL_TABLET | Freq: Two times a day (BID) | ORAL | Status: DC

## 2022-02-19 MED ORDER — SODIUM CHLORIDE 0.9 % IV SOLN: INTRAVENOUS | Status: DC

## 2022-02-19 MED ORDER — METOPROLOL SUCCINATE ER 25 MG PO TB24: 25.0000 mg | ORAL_TABLET | Freq: Every day | ORAL | Status: DC

## 2022-02-19 MED ORDER — SERTRALINE HCL 50 MG PO TABS: 100.0000 mg | ORAL_TABLET | Freq: Every day | ORAL | Status: DC

## 2022-02-19 MED ORDER — ACETAMINOPHEN 325 MG RE SUPP: 650.0000 mg | Freq: Four times a day (QID) | RECTAL | Status: DC | PRN

## 2022-02-19 MED ORDER — ONDANSETRON HCL 4 MG PO TABS: 4.0000 mg | ORAL_TABLET | Freq: Four times a day (QID) | ORAL | Status: DC | PRN

## 2022-02-19 MED ORDER — LINAGLIPTIN 5 MG PO TABS
5.0000 mg | ORAL_TABLET | Freq: Every day | ORAL | Status: DC
  Filled 2022-02-19: qty 1

## 2022-02-19 MED ORDER — APIXABAN 5 MG PO TABS
5.0000 mg | ORAL_TABLET | Freq: Two times a day (BID) | ORAL | Status: DC
  Administered 2022-02-19 (×2): 5 mg via ORAL
  Filled 2022-02-19 (×2): qty 1

## 2022-02-19 MED ORDER — MIRABEGRON ER 50 MG PO TB24
50.0000 mg | ORAL_TABLET | Freq: Every day | ORAL | Status: DC
  Filled 2022-02-19: qty 1

## 2022-02-19 MED ORDER — POLYVINYL ALCOHOL 1.4 % OP SOLN: 1.0000 [drp] | Freq: Two times a day (BID) | OPHTHALMIC | Status: DC | PRN

## 2022-02-19 MED ORDER — DOFETILIDE 250 MCG PO CAPS
250.0000 ug | ORAL_CAPSULE | Freq: Two times a day (BID) | ORAL | Status: DC
  Administered 2022-02-19 (×2): 250 ug via ORAL
  Filled 2022-02-19 (×3): qty 1

## 2022-02-19 MED ORDER — PROPOFOL 10 MG/ML IV BOLUS
INTRAVENOUS | Status: AC
  Filled 2022-02-19: qty 20

## 2022-02-19 MED ORDER — PROPOFOL 10 MG/ML IV BOLUS
INTRAVENOUS | Status: DC | PRN
  Administered 2022-02-19: 50 mg via INTRAVENOUS

## 2022-02-19 MED ORDER — SODIUM CHLORIDE 0.9 % IV SOLN
1.0000 g | INTRAVENOUS | Status: DC
  Administered 2022-02-19: 1 g via INTRAVENOUS
  Filled 2022-02-19: qty 10

## 2022-02-19 MED ORDER — CALCIUM CARBONATE 1250 (500 CA) MG PO TABS: 1.0000 | ORAL_TABLET | Freq: Every day | ORAL | Status: DC

## 2022-02-19 MED ORDER — LABETALOL HCL 5 MG/ML IV SOLN: 20.0000 mg | INTRAVENOUS | Status: DC | PRN

## 2022-02-19 MED ORDER — ACETAMINOPHEN 325 MG PO TABS
650.0000 mg | ORAL_TABLET | Freq: Four times a day (QID) | ORAL | Status: DC | PRN
  Administered 2022-02-19: 650 mg via ORAL
  Filled 2022-02-19: qty 2

## 2022-02-19 MED ORDER — CEFDINIR 300 MG PO CAPS: 300.0000 mg | ORAL_CAPSULE | Freq: Two times a day (BID) | ORAL | 0 refills | Status: AC

## 2022-02-19 MED ORDER — ATORVASTATIN CALCIUM 20 MG PO TABS: 10.0000 mg | ORAL_TABLET | Freq: Every day | ORAL | Status: DC

## 2022-02-19 MED ORDER — HYDRALAZINE HCL 20 MG/ML IJ SOLN: 10.0000 mg | Freq: Four times a day (QID) | INTRAMUSCULAR | Status: DC | PRN

## 2022-02-19 NOTE — Assessment & Plan Note (Addendum)
Persistent A-fib Cardiology consulted.  Patient underwent cardioversion successfully this AM.  Symptoms improved. Continue Lopressor, Tikosyn, Eliquis Patient has follow up with Dr. Quentin Ore tomorrow.

## 2022-02-19 NOTE — H&P (View-Only) (Signed)
Cardiology Consultation   Patient ID: [name] MRN: [id number]; DOB: 1945/05/19  Admit date: 02/18/2022 Date of Consult: 02/19/2022  PCP:  Jinny Sanders, MD   [place] Providers Cardiologist:  Kathlyn Sacramento, MD  Electrophysiologist:  Virl Axe, MD       Patient Profile:   [name] is a 76 y.o. female with a hx of hypertension, hyperlipidemia, persistent atrial fibrillation, type II diabetes, hypothyroidism, OSA on CPAP, who is being seen 02/19/2022 for the evaluation of atrial fibrillation RVR  at the request of Dr. Sidney Ace.  History of Present Illness:   [name] is a 76 year old female with a history of persistent atrial fibrillation on apixaban that was started post cardioversion 01/2016, 05/2016, and 11/2016 status post radiofrequency catheter ablation 12/17/2016 status post repeat DCCV 09/07/2019 followed by EP/A-fib clinic, atrial tachycardia status post DCCV 09/18/2018, pulmonary hypertension, mitral valve prolapse with mitral regurgitation, type 2 diabetes, hypertension, hyperlipidemia, hypothyroidism, anemia, obesity, OSA on CPAP, diverticulosis, gastroesophageal reflux disease, anxiety.  She was last seen in clinic 11/21/2021 by Dr. Caryl Comes which she had been treated with flecainide 100 mg twice daily and continued on apixaban 5 mg twice daily.  After that time she developed palpitations and was seen in the emergency department and underwent cardioversion.  She did have recurrent falls when she had gone into atrial fibrillation and ended up in the ER 04/2021.  She also had atrial tachycardia identified 5 RUE/PA for which her loop recorder was reprogrammed and she had multiple episodes of tachycardia with ventricular rate about 130/140, regular suggestive of atrial flutter perhaps post ablation.  Her flecainide was discontinued to the to be an adequate and she was started on Tikosyn.  See significantly improved initially on Tikosyn, however recently has had  increasing problems with palpitations particularly in the evening hours making sleep difficult.  Associated lightheadedness.  Also noted worsening fatigue which she attributes to the Tikosyn.  She did have her ILR implanted 2021 which demonstrated atrial tachycardia/fibrillation burden of 7.8% maximum rates up to 200+.  Last device interrogation was on 01/13/2022 which revealed the battery status is okay with normal device function, 6 tachycardia events,.  Commendation for sinus tachycardia likely atrial fibrillation with RVR, 653 A. tach episodes, 107 A-fib episodes with a burden of 20.8%.  At the time she was continued on Eliquis and Tikosyn.  She presented to the [facility] emergency department on 02/18/2022 with a complaint of palpitations associated with mild chest pain, and exertional dyspnea.  She stated prior to her episode she was in her regular state of health.  She also states that she has been compliant with all of her medications with the exception of cholesterol medications.  On arrival she was noted to be atrial fibrillation with heart rates of 150s-170s.  She stated she had recently been treated for UTI from urgent care.  She also stated that she has an upcoming appointment with the EP Dr. Quentin Ore on Wednesday of this week.  Initial vitals: Blood pressure 122/96, pulse 150s-170s, respirations 18, temperature 98.2  Pertinent labs: Blood glucose of 220, serum creatinine 1.19, GFR 48, hemoglobin 11.7, TSH 3.291, 70 troponins 11 and 18, respiratory panel is negative, magnesium 2.1  Medications administered in the emergency department:adenosine 6 mg followed by adenosine at 12 mg and metoprolol tartrate 5 mg IV push 4 times and given metoprolol to tartrate 50 mg tablet,  Past Medical History:  Diagnosis Date   Anemia    Anxiety  Atrial fibrillation (HCC)    Colon polyps    Constipation    Depressive disorder, not elsewhere classified    Diabetes mellitus without complication ([place])     Diaphragmatic hernia without mention of obstruction or gangrene    Disorders of bursae and tendons in shoulder region, unspecified    Diverticulosis of colon (without mention of hemorrhage)    External hemorrhoid    Fundic gland polyps of stomach, benign    GERD (gastroesophageal reflux disease)    History of hiatal hernia    HTN (hypertension)    Hypertension    Hypothyroidism    Internal hemorrhoids without mention of complication    Moderate mitral regurgitation    Obesity, unspecified    OSA on CPAP    compliant with CPAP   Other urinary incontinence    Persistent atrial fibrillation ([place])    Pure hypercholesterolemia    Type II diabetes mellitus ([place])    Urinary frequency    Urinary urgency    Vitamin B 12 deficiency     Past Surgical History:  Procedure Laterality Date   ABDOMINAL HYSTERECTOMY     ABDOMINAL HYSTERECTOMY  1972   Have both ovaries   ATRIAL FIBRILLATION ABLATION  12/17/2016   ATRIAL FIBRILLATION ABLATION N/A 12/17/2016   Procedure: ATRIAL FIBRILLATION ABLATION;  Surgeon: Thompson Grayer, MD;  Location: [facility];  Service: Cardiovascular;  Laterality: N/A;   [place]   [facility]; "Dr. [name]"   CARDIOVERSION N/A 05/27/2016   Procedure: CARDIOVERSION;  Surgeon: [name], MD;  Location: [facility];  Service: Cardiovascular;  Laterality: N/A;   CARDIOVERSION N/A 11/25/2016   Procedure: CARDIOVERSION;  Surgeon: [name], MD;  Location: [facility];  Service: Cardiovascular;  Laterality: N/A;   CARDIOVERSION N/A 09/18/2018   Procedure: CARDIOVERSION;  Surgeon: [name], MD;  Location: [facility];  Service: Cardiovascular;  Laterality: N/A;   CARDIOVERSION N/A 07/28/2019   Procedure: CARDIOVERSION;  Surgeon: Kate Sable, MD;  Location: [facility];  Service: Cardiovascular;  Laterality: N/A;   CARDIOVERSION N/A 09/07/2019   Procedure: CARDIOVERSION;  Surgeon: Nelva Bush, MD;  Location: [facility];  Service:  Cardiovascular;  Laterality: N/A;   CATARACT EXTRACTION W/ INTRAOCULAR LENS  IMPLANT, BILATERAL Bilateral    COLONOSCOPY     ELECTROPHYSIOLOGIC STUDY N/A 01/24/2016   Procedure: CARDIOVERSION;  Surgeon: [name], MD;  Location: [facility];  Service: Cardiovascular;  Laterality: N/A;   implantable loop recorder implantation  10/18/2019   [employer] Reveal [place] model [id number]) implantable loop recorder    LAPAROSCOPIC CHOLECYSTECTOMY     REDUCTION MAMMAPLASTY Bilateral    TONSILLECTOMY     TOTAL HIP ARTHROPLASTY Right 05/12/2018   Procedure: TOTAL HIP ARTHROPLASTY ANTERIOR APPROACH;  Surgeon: Paralee Cancel, MD;  Location: [facility];  Service: Orthopedics;  Laterality: Right;  70 mins     Home Medications:  Prior to Admission medications   Medication Sig Start Date End Date Taking? Authorizing Provider  apixaban (ELIQUIS) 5 MG TABS tablet TAKE ONE TABLET BY MOUTH TWICE DAILY 11/15/21  Yes [name], MD  Ascorbic Acid (VITAMIN C PO) Take 1 tablet by mouth at bedtime.   Yes [provider]  atorvastatin (LIPITOR) 10 MG tablet TAKE ONE TABLET BY MOUTH AT BEDTIME 12/27/21  Yes Bedsole, Amy E, MD  calcium carbonate (OS-CAL - DOSED IN MG OF ELEMENTAL CALCIUM) 1250 (500 Ca) MG tablet Take 1 tablet by mouth daily with breakfast.   Yes [provider]  dapagliflozin propanediol (FARXIGA) 5 MG TABS tablet Take 1 tablet (5 mg total) by mouth daily before breakfast. 11/28/21  Yes [name], MD  dofetilide (TIKOSYN) 250 MCG capsule TAKE 1 CAPSULE BY MOUTH TWO TIMES DAILY 11/29/21  Yes Deboraha Sprang, MD  linagliptin (TRADJENTA) 5 MG TABS tablet TAKE 1 TABLET BY MOUTH DAILY 01/30/22  Yes [name], MD  magnesium oxide (MAG-OX) 400 MG tablet Take 1 tablet (400 mg total) by mouth 2 (two) times daily. 05/11/21  Yes Shirley Friar, PA-C  metFORMIN (GLUCOPHAGE-XR) 500 MG 24 hr tablet TAKE TWO TABLETS BY MOUTH TWICE DAILY WITH MEALS 12/27/21  Yes [name], MD  metoprolol succinate (TOPROL-XL) 25 MG 24 hr tablet Take one tablet by mouth at bedtime 11/21/21  Yes Dunn, Ryan M, PA-C  MYRBETRIQ 50 MG TB24 tablet Take 50 mg by mouth daily. 11/27/20  Yes [provider]  ONETOUCH ULTRA test strip CHECK BLOOD SUGAR 2 TIMES A DAY AS DIRECTED DX CODE E11.9 08/24/20  Yes [name], MD  sertraline (ZOLOFT) 100 MG tablet Take 1 tablet (100 mg total) by mouth at bedtime. 12/04/21  Yes Bedsole, Amy E, MD  zolpidem (AMBIEN) 10 MG tablet Take 10 mg by mouth at bedtime. 02/05/22  Yes [provider]  doxylamine, Sleep, (UNISOM) 25 MG tablet Take 25 mg by mouth at bedtime as needed.    [provider]  hydroxypropyl methylcellulose / hypromellose (ISOPTO TEARS / GONIOVISC) 2.5 % ophthalmic solution Place 1 drop into both eyes 2 (two) times daily as needed for dry eyes.    [provider]    Inpatient Medications: Scheduled Meds:  adenosine (ADENOCARD) IV  6 mg Intravenous Once   apixaban  5 mg Oral BID   atorvastatin  10 mg Oral QHS   calcium carbonate  1 tablet Oral Q breakfast   dapagliflozin propanediol  5 mg Oral QAC breakfast   dofetilide  250 mcg Oral BID   linagliptin  5 mg Oral Daily   magnesium oxide  400 mg Oral BID   metoprolol succinate  25 mg Oral Daily   metoprolol tartrate       mirabegron ER  50 mg Oral Daily   sertraline  100 mg Oral QHS   zolpidem  5 mg Oral QHS   Continuous Infusions:  sodium chloride 100 mL/hr at 02/19/22 0840   cefTRIAXone (ROCEPHIN)  IV     PRN Meds: acetaminophen **OR** acetaminophen, magnesium hydroxide, metoprolol tartrate, ondansetron **OR** ondansetron (ZOFRAN) IV, polyvinyl alcohol, traZODone  Allergies:    Allergies  Allergen Reactions   Cortisone Shortness Of Breath and Rash   Pravachol [Pravastatin Sodium] Other (See Comments)    Leg pain    Contrast Media [Iodinated Contrast Media] Rash    Social History:   Social History   Socioeconomic History    Marital status: Married    Spouse name: Jimmy   Number of children: 2   Years of education: Not on file   Highest education level: Not on file  Occupational History   Occupation: retired  Tobacco Use   Smoking status: Never   Smokeless tobacco: Never   Tobacco comments:    Never smoke 08/30/21  Vaping Use   Vaping Use: Never used  Substance and Sexual Activity   Alcohol use: Yes    Comment: a little bit of wine at bedtime to help sleep   Drug use: No   Sexual activity: Yes  Other Topics Concern  Not on file  Social History Narrative   ** Merged History Encounter **       Exercise walks 3 times daily Lives with spouse in [place] - retired from employment 1 son and 1 daughter  Diet fruit, occ fast food, salads and lean meat  3 tea/day, no EtOH, no tobacco, no drugs    Social Determinants of Health   Financial Resource Strain: Low Risk  (08/31/2021)   Overall Financial Resource Strain (CARDIA)    Difficulty of Paying Living Expenses: Not hard at all  Food Insecurity: No Food Insecurity (08/31/2021)   Hunger Vital Sign    Worried About Running Out of Food in the Last Year: Never true    [place] in the Last Year: Never true  Transportation Needs: No Transportation Needs (08/31/2021)   PRAPARE - Hydrologist (Medical): No    Lack of Transportation (Non-Medical): No  Physical Activity: Unknown (08/31/2021)   Exercise Vital Sign    Days of Exercise per Week: 3 days    Minutes of Exercise per Session: Not on file  Stress: No Stress Concern Present (08/31/2021)   [place]    Feeling of Stress : Only a little  Social Connections: Socially Integrated (08/31/2021)   Social Connection and Isolation Panel [NHANES]    Frequency of Communication with Friends and Family: More than three times a week    Frequency of Social Gatherings with Friends and Family: More  than three times a week    Attends Religious Services: More than 4 times per year    Active Member of [employer] or Organizations: Yes    Attends Music therapist: More than 4 times per year    Marital Status: Married  Human resources officer Violence: Not At Risk (08/31/2021)   Humiliation, Afraid, Rape, and Kick questionnaire    Fear of Current or Ex-Partner: No    Emotionally Abused: No    Physically Abused: No    Sexually Abused: No    Family History:    Family History  Problem Relation Age of Onset   Cancer Mother        fallopian tube   Dementia Mother    Pulmonary embolism Mother    Hyperlipidemia Mother    Diabetes Mother    Hyperlipidemia Father    Lung cancer Father    Hypertension Sister    Diabetes Brother    Cancer Brother    Stroke Paternal Grandmother    Stroke Paternal Grandfather    Kidney cancer Son    Dementia Maternal Aunt    Colon cancer Neg Hx    Esophageal cancer Neg Hx      ROS:  Please see the history of present illness.  Review of Systems  Constitutional:  Positive for malaise/fatigue.  Respiratory:  Positive for shortness of breath.   Cardiovascular:  Positive for chest pain and palpitations.  Genitourinary:  Positive for frequency and urgency.  Musculoskeletal:  Positive for joint pain.  Psychiatric/Behavioral:  The patient has insomnia.     All other ROS reviewed and negative.     Physical Exam/Data:   Vitals:   02/19/22 0400 02/19/22 0515 02/19/22 0600 02/19/22 0800  BP: 108/63  (!) 101/57 120/70  Pulse: 72  78 72  Resp: '16  15 19  '$ Temp:  98.4 F (36.9 C)  97.8 F (36.6 C)  TempSrc:  Oral  Oral  SpO2: 97%  98% 98%  Weight:      Height:       No intake or output data in the 24 hours ending 02/19/22 0901    02/18/2022    8:19 PM 12/19/2021    8:34 AM 11/29/2021    9:02 AM  Last 3 Weights  Weight (lbs) 175 lb 178 lb 2 oz 176 lb 4 oz  Weight (kg) 79.379 kg 80.797 kg 79.946 kg     Body mass index is 29.12 kg/m.   General:  Well nourished, well developed, in no acute distress HEENT: normal Neck: no JVD Vascular: No carotid bruits; Distal pulses 2+ bilaterally Cardiac:  normal S1, S2; irregularly irregular; no murmur  Lungs:  clear to auscultation bilaterally, no wheezing, rhonchi or rales  Abd: soft, nontender, no hepatomegaly  Ext: no edema Musculoskeletal:  No deformities, BUE and BLE strength normal and equal Skin: warm and dry  Neuro:  CNs 2-12 intact, no focal abnormalities noted Psych:  Normal affect   EKG:  The EKG was personally reviewed and demonstrates: On arrival she was atrial fibrillation with a rate of 174, repeat showed atrial fibrillation that was rate controlled Telemetry:  Telemetry was personally reviewed and demonstrates: Rate controlled atrial fibrillation rate 80 to 90s  Relevant CV Studies: TTE 01/03/2022 1. Left ventricular ejection fraction, by estimation, is 55 to 60%. The  left ventricle has normal function. The left ventricle has no regional  wall motion abnormalities. Left ventricular diastolic parameters are  consistent with Grade II diastolic  dysfunction (pseudonormalization).   2. Right ventricular systolic function is normal. The right ventricular  size is normal. There is normal pulmonary artery systolic pressure. The  estimated right ventricular systolic pressure is 11.9 mmHg.   3. Left atrial size was severely dilated.   4. The mitral valve is normal in structure. Mild mitral valve  regurgitation. No evidence of mitral stenosis.   5. The aortic valve is tricuspid. Aortic valve regurgitation is not  visualized. No aortic stenosis is present.   6. The inferior vena cava is normal in size with greater than 50%  respiratory variability, suggesting right atrial pressure of 3 mmHg.   MPI 11/16/2021  Pharmacological myocardial perfusion imaging study with no significant  ischemia GI uptake artifact noted Normal wall motion, EF estimated at 75% No EKG changes  concerning for ischemia at peak stress or in recovery. CT attenuation correction images with no significant coronary calcification, minimal aortic atherosclerosis Low risk scan   Zio patch 03/2021: Patient had a min HR of 45 bpm, max HR of 166 bpm, and avg HR of 93 bpm. Predominant underlying rhythm was Atrial Fibrillation. First Degree AV Block was present. 3 Supraventricular Tachycardia runs occurred, the run with the fastest interval lasting  14.1 secs with a max rate of 146 bpm (avg 126 bpm); the run with the fastest interval was also the longest. Atrial Fibrillation occurred (56% burden), ranging from 56-166 bpm (avg of 119 bpm), the longest lasting 6 days 13 hours with an avg rate of 120  bpm. Atrial Fibrillation was detected within +/- 45 seconds of symptomatic patient event(s). Isolated SVEs were rare (<1.0%), SVE Couplets were rare (<1.0%), and SVE Triplets were rare (<1.0%). Isolated VEs were rare (<1.0%), VE Couplets were rare  (<1.0%), and no VE Triplets were present. Difficulty discerning atrial activity making definitive diagnosis difficult to ascertain.   1. NSR with sinus tachy and sinus bradycardia 2. Periods of SVT up to 145/min at times transitioning to  atrial fib vs SVT with block 3. No VT 4. Rare PVC's and occaisional PAC's Laboratory Data:  High Sensitivity Troponin:   Recent Labs  Lab 02/18/22 2023 02/18/22 2245  TROPONINIHS 11 18*     Chemistry Recent Labs  Lab 02/18/22 2023 02/19/22 0508  NA 135 138  K 4.1 4.5  CL 102 111  CO2 24 23  GLUCOSE 220* 205*  BUN 20 21  CREATININE 1.19* 0.89  CALCIUM 9.3 9.1  MG 2.1  --   GFRNONAA 48* >60  ANIONGAP 9 4*    Recent Labs  Lab 02/18/22 2023  PROT 7.6  ALBUMIN 4.1  AST 24  ALT 15  ALKPHOS 79  BILITOT 0.6   Lipids No results for input(s): "CHOL", "TRIG", "HDL", "LABVLDL", "LDLCALC", "CHOLHDL" in the last 168 hours.  Hematology Recent Labs  Lab 02/18/22 2023 02/19/22 0508  WBC 6.8 6.5  RBC 4.24 3.87   HGB 11.7* 10.5*  HCT 37.7 34.0*  MCV 88.9 87.9  MCH 27.6 27.1  MCHC 31.0 30.9  RDW 14.2 14.2  PLT 391 336   Thyroid  Recent Labs  Lab 02/18/22 2245  TSH 3.291  FREET4 0.74    BNPNo results for input(s): "BNP", "PROBNP" in the last 168 hours.  DDimer No results for input(s): "DDIMER" in the last 168 hours.   Radiology/Studies:  DG Chest Portable 1 View  Result Date: 02/18/2022 CLINICAL DATA:  Chest pain EXAM: PORTABLE CHEST 1 VIEW COMPARISON:  Chest x-ray 03/24/2021 FINDINGS: Loop recorder device overlies the left chest, unchanged. There is some minimal patchy opacities in the left lung base along the diaphragm. No pleural effusion or pneumothorax. Cardiomediastinal silhouette within normal limits. There are degenerative changes of both shoulders. IMPRESSION: Minimal patchy opacities in the left lung base, likely atelectasis. Electronically Signed   By: Ronney Asters M.D.   On: 02/18/2022 21:02     Assessment and Plan:   Persistent atrial fibrillation with RVR -Currently rate controlled atrial fibrillation 80s to 90s on the bedside cardiac monitor -She has been continued on Tikosyn -Continued on Toprol XL 25 mg daily -She is continued on apixaban 5 mg twice daily for CHA2DS2-VASc score of at least 4, patient states she has been compliant and has not missed any doses. -Electrolytes and TSH WNL -After discussion about her being symptomatic to the atrial fibrillation she has been scheduled for repeat DCCV later on this morning. -She has remained n.p.o. -Continue with cardiac monitoring -Continue to keep appoint with Dr. Quentin Ore later this week  Essential hypertension -Blood pressure 120/70 -Continue metoprolol succinate 25 mg daily -Vital signs per unit protocol  Hyperlipidemia -LDL 73 -Continued on atorvastatin 10 mg daily  Type 2 diabetes -Hemoglobin A1c -Currently receiving Farxiga -PTA metformin on hold -Management per primary team   Shared Decision  Making/Informed Consent The risks (stroke, cardiac arrhythmias rarely resulting in the need for a temporary or permanent pacemaker, skin irritation or burns and complications associated with conscious sedation including aspiration, arrhythmia, respiratory failure and death), benefits (restoration of normal sinus rhythm) and alternatives of a direct current cardioversion were explained in detail to Ms. Jeune and she agrees to proceed.     Risk Assessment/Risk Scores:          CHA2DS2-VASc Score = 4   This indicates a 4.8% annual risk of stroke. The patient's score is based upon: CHF History: 0 HTN History: 1 Diabetes History: 1 Stroke History: 0 Vascular Disease History: 0 Age Score: 1 Gender Score: 1  For questions or updates, please contact [place] Please consult [email or web address] for contact info under    Signed, Rayette Mogg, NP  02/19/2022 9:01 AM

## 2022-02-19 NOTE — Anesthesia Procedure Notes (Signed)
Date/Time: 02/19/2022 10:31 AM  Performed by: Lily Peer, Amiyrah Lamere, CRNAPre-anesthesia Checklist: Patient identified, Emergency Drugs available, Suction available, Patient being monitored and Timeout performed Patient Re-evaluated:Patient Re-evaluated prior to induction Oxygen Delivery Method: Nasal cannula Induction Type: IV induction

## 2022-02-19 NOTE — Anesthesia Postprocedure Evaluation (Signed)
Anesthesia Post Note  Patient: [name]  Procedure(s) Performed: CARDIOVERSION CARDIOVERSION  Patient location during evaluation: Specials Recovery Anesthesia Type: General Level of consciousness: awake and alert Pain management: pain level controlled Vital Signs Assessment: post-procedure vital signs reviewed and stable Respiratory status: spontaneous breathing, nonlabored ventilation, respiratory function stable and patient connected to nasal cannula oxygen Cardiovascular status: blood pressure returned to baseline and stable Postop Assessment: no apparent nausea or vomiting Anesthetic complications: no   No notable events documented.   Last Vitals:  Vitals:   02/19/22 1045 02/19/22 1100  BP: (!) 88/61 112/62  Pulse: (!) 58 61  Resp: 17 20  Temp:    SpO2: 99% 96%    Last Pain:  Vitals:   02/19/22 1019  TempSrc: Oral  PainSc: 0-No pain                 Ilene Qua

## 2022-02-19 NOTE — Care Management CC44 (Signed)
Condition Code 44 Documentation Completed  Patient Details  Name: NOLAN LASSER MRN: [id number] Date of Birth: [birth year]   Condition Code 44 given:  Yes Patient signature on Condition Code 44 notice:  Yes Documentation of 2 MD's agreement:  Yes Code 44 added to claim:  Yes    Shelbie Hutching, RN 02/19/2022, 1:39 PM

## 2022-02-19 NOTE — Interval H&P Note (Signed)
History and Physical Interval Note:  02/19/2022 9:33 AM  [name]  has presented today for surgery, with the diagnosis of atrial fibrillation.  The various methods of treatment have been discussed with the patient and family. After consideration of risks, benefits and other options for treatment, the patient has consented to  Procedure(s): CARDIOVERSION (N/A) as a surgical intervention.  The patient's history has been reviewed, patient examined, no change in status, stable for surgery.  I have reviewed the patient's chart and labs.  Questions were answered to the patient's satisfaction.     Nalayah Hitt

## 2022-02-19 NOTE — Anesthesia Preprocedure Evaluation (Signed)
Anesthesia Evaluation  Patient identified by MRN, date of birth, ID band Patient awake    Reviewed: Allergy & Precautions, NPO status , Patient's Chart, lab work & pertinent test results  History of Anesthesia Complications Negative for: history of anesthetic complications  Airway Mallampati: II  TM Distance: >3 FB Neck ROM: full    Dental no notable dental hx.    Pulmonary sleep apnea and Continuous Positive Airway Pressure Ventilation , COPD   Pulmonary exam normal        Cardiovascular hypertension, On Medications + dysrhythmias Atrial Fibrillation      Neuro/Psych  PSYCHIATRIC DISORDERS Anxiety Depression     Neuromuscular disease    GI/Hepatic hiatal hernia,GERD  ,,(+)     substance abuse  alcohol use  Endo/Other  diabetesHypothyroidism    Renal/GU negative Renal ROS  negative genitourinary   Musculoskeletal   Abdominal   Peds  Hematology negative hematology ROS (+)   Anesthesia Other Findings Past Medical History: No date: Anemia No date: Anxiety No date: Atrial fibrillation (HCC) No date: Colon polyps No date: Constipation No date: Depressive disorder, not elsewhere classified No date: Diabetes mellitus without complication (HCC) No date: Diaphragmatic hernia without mention of obstruction or  gangrene No date: Disorders of bursae and tendons in shoulder region,  unspecified No date: Diverticulosis of colon (without mention of hemorrhage) No date: External hemorrhoid No date: Fundic gland polyps of stomach, benign No date: GERD (gastroesophageal reflux disease) No date: History of hiatal hernia No date: HTN (hypertension) No date: Hypertension No date: Hypothyroidism No date: Internal hemorrhoids without mention of complication No date: Moderate mitral regurgitation No date: Obesity, unspecified No date: OSA on CPAP     Comment:  compliant with CPAP No date: Other urinary incontinence No  date: Persistent atrial fibrillation (HCC) No date: Pure hypercholesterolemia No date: Type II diabetes mellitus ([place]) No date: Urinary frequency No date: Urinary urgency No date: Vitamin B 12 deficiency  Past Surgical History: No date: ABDOMINAL HYSTERECTOMY 1972: ABDOMINAL HYSTERECTOMY     Comment:  Have both ovaries 12/17/2016: ATRIAL FIBRILLATION ABLATION 12/17/2016: ATRIAL FIBRILLATION ABLATION; N/A     Comment:  Procedure: ATRIAL FIBRILLATION ABLATION;  Surgeon:               Thompson Grayer, MD;  Location: [place] [facility];                Service: Cardiovascular;  Laterality: N/A; 1990s: CARDIAC CATHETERIZATION     Comment:  [facility]; "Dr. [name]" 05/27/2016: CARDIOVERSION; N/A     Comment:  Procedure: CARDIOVERSION;  Surgeon: [name],               MD;  Location: [facility];  Service: Cardiovascular;                Laterality: N/A; 11/25/2016: CARDIOVERSION; N/A     Comment:  Procedure: [id number];  Surgeon: [name],               MD;  Location: [facility];  Service: Cardiovascular;                Laterality: N/A; 09/18/2018: CARDIOVERSION; N/A     Comment:  Procedure: CARDIOVERSION;  Surgeon: [name],               MD;  Location: [facility];  Service: Cardiovascular;                Laterality: N/A; 07/28/2019: CARDIOVERSION; N/A  Comment:  Procedure: CARDIOVERSION;  Surgeon: Kate Sable,               MD;  Location: [facility];  Service: Cardiovascular;                Laterality: N/A; 09/07/2019: CARDIOVERSION; N/A     Comment:  Procedure: CARDIOVERSION;  Surgeon: Nelva Bush,               MD;  Location: [facility];  Service: Cardiovascular;                Laterality: N/A; No date: CATARACT EXTRACTION W/ INTRAOCULAR LENS  IMPLANT, BILATERAL;  Bilateral No date: COLONOSCOPY 01/24/2016: ELECTROPHYSIOLOGIC STUDY; N/A     Comment:  Procedure: CARDIOVERSION;  Surgeon: [name],               MD;  Location: [facility];  Service:  Cardiovascular;                Laterality: N/A; 10/18/2019: implantable loop recorder implantation     Comment:  Medtronic Reveal Linq model [id number] ([id number])               implantable loop recorder  No date: LAPAROSCOPIC CHOLECYSTECTOMY No date: REDUCTION MAMMAPLASTY; Bilateral No date: TONSILLECTOMY 05/12/2018: TOTAL HIP ARTHROPLASTY; Right     Comment:  Procedure: TOTAL HIP ARTHROPLASTY ANTERIOR APPROACH;                Surgeon: Paralee Cancel, MD;  Location: [facility];  Service:               Orthopedics;  Laterality: Right;  70 mins  BMI    Body Mass Index: 28.29 kg/m      Reproductive/Obstetrics negative OB ROS                             Anesthesia Physical Anesthesia Plan  ASA: 3  Anesthesia Plan: General   Post-op Pain Management: Minimal or no pain anticipated   Induction: Intravenous  PONV Risk Score and Plan: Propofol infusion and TIVA  Airway Management Planned: Natural Airway and Nasal Cannula  Additional Equipment:   Intra-op Plan:   Post-operative Plan:   Informed Consent: I have reviewed the patients History and Physical, chart, labs and discussed the procedure including the risks, benefits and alternatives for the proposed anesthesia with the patient or authorized representative who has indicated his/her understanding and acceptance.     Dental Advisory Given  Plan Discussed with: Anesthesiologist, CRNA and Surgeon  Anesthesia Plan Comments: (Patient consented for risks of anesthesia including but not limited to:  - adverse reactions to medications - risk of airway placement if required - damage to eyes, teeth, lips or other oral mucosa - nerve damage due to positioning  - sore throat or hoarseness - Damage to heart, brain, nerves, lungs, other parts of body or loss of life  Patient voiced understanding.)       Anesthesia Quick Evaluation

## 2022-02-19 NOTE — Transfer of Care (Signed)
Immediate Anesthesia Transfer of Care Note  Patient: [name]  Procedure(s) Performed: CARDIOVERSION CARDIOVERSION  Patient Location: Cath Lab  Anesthesia Type:General  Level of Consciousness: drowsy  Airway & Oxygen Therapy: Patient Spontanous Breathing  Post-op Assessment: Report given to RN  Post vital signs: Reviewed and stable  Last Vitals:  Vitals Value Taken Time  BP 109/65   Temp    Pulse 108 02/19/22 1028  Resp 16 02/19/22 1028  SpO2 88 % 02/19/22 1028  Vitals shown include unvalidated device data.  Last Pain:  Vitals:   02/19/22 1019  TempSrc: Oral  PainSc: 0-No pain         Complications: No notable events documented.

## 2022-02-19 NOTE — ED Notes (Signed)
Pt given snack and drink as currently no food trays in ER. Pt states husband took her home dose of tikosyn with him. Will give to pt once pharm verifies as attending Mans[place] verbal okay for pt to take the tikosyn.

## 2022-02-19 NOTE — H&P (Signed)
[place]   PATIENT NAME: [name]    MR#:  [id number]  DATE OF BIRTH:  29-Arvie Bartholomew-1947  DATE OF ADMISSION:  02/18/2022  PRIMARY CARE PHYSICIAN: Jinny Sanders, MD   Patient is coming from: Home  REQUESTING/REFERRING PHYSICIAN: Lucillie Garfinkel, MD  CHIEF COMPLAINT:  Palpitations  HISTORY OF PRESENT ILLNESS:  [name] is a 76 y.o. female with medical history significant for paroxysmal atrial fibrillation on Eliquis, diverticulosis, GERD, hypertension, hypothyroidism, OSA on CPAP, type 2 diabetes mellitus, who presented to the emergency room with a [place] of palpitations with a feeling of fast heart beating and irregularity with mild chest discomfort.  She denied any associated nausea or vomiting or diaphoresis.  No dyspnea or cough or wheezing or hemoptysis.  No leg pain or edema or recent travels or surgeries.  No paresthesias or focal muscle weakness.  No dysuria, oliguria or hematuria or flank pain.  No bleeding diathesis.  ED Course: When she came to the ER, heart rate was up to 175 with BP 122/96 and later 143/83 and respiratory rate of 24.  CMP was remarkable for blood glucose of 220 and creatinine 1.19 and high sensitive troponin was 11.  CBC showed hemoglobin 11.7 hematocrit 37.7. EKG as reviewed by me : Initial EKG showed supraventricular tachycardia with rate of 174 with ST segment depression in anterolateral and inferior leads. Imaging: Portable chest ray showed minimal patchy opacities in the left lung base likely atelectasis.  The patient was given 5 mg of IV Lopressor X 3 and 50 mg of p.o. Lopressor as well as 1 L bolus of IV normal saline.  She will be admitted to a progressive unit bed for further evaluation and management. PAST MEDICAL HISTORY:   Past Medical History:  Diagnosis Date   Anemia    Anxiety    Atrial fibrillation (HCC)    Colon polyps    Constipation    Depressive disorder, not elsewhere classified    Diabetes mellitus without  complication ([place])    Diaphragmatic hernia without mention of obstruction or gangrene    Disorders of bursae and tendons in shoulder region, unspecified    Diverticulosis of colon (without mention of hemorrhage)    External hemorrhoid    Fundic gland polyps of stomach, benign    GERD (gastroesophageal reflux disease)    History of hiatal hernia    HTN (hypertension)    Hypertension    Hypothyroidism    Internal hemorrhoids without mention of complication    Moderate mitral regurgitation    Obesity, unspecified    OSA on CPAP    compliant with CPAP   Other urinary incontinence    Persistent atrial fibrillation ([place])    Pure hypercholesterolemia    Type II diabetes mellitus ([place])    Urinary frequency    Urinary urgency    Vitamin B 12 deficiency     PAST SURGICAL HISTORY:   Past Surgical History:  Procedure Laterality Date   ABDOMINAL HYSTERECTOMY     ABDOMINAL HYSTERECTOMY  1972   Have both ovaries   ATRIAL FIBRILLATION ABLATION  12/17/2016   ATRIAL FIBRILLATION ABLATION N/A 12/17/2016   Procedure: ATRIAL FIBRILLATION ABLATION;  Surgeon: Thompson Grayer, MD;  Location: [place];  Service: Cardiovascular;  Laterality: N/A;   [place]   [facility]; "Dr. [name]"   CARDIOVERSION N/A 05/27/2016   Procedure: CARDIOVERSION;  Surgeon: [name], MD;  Location: [facility];  Service: Cardiovascular;  Laterality: N/A;   CARDIOVERSION N/A 11/25/2016   Procedure: CARDIOVERSION;  Surgeon: [name], MD;  Location: [facility];  Service: Cardiovascular;  Laterality: N/A;   CARDIOVERSION N/A 09/18/2018   Procedure: CARDIOVERSION;  Surgeon: [name], MD;  Location: [facility];  Service: Cardiovascular;  Laterality: N/A;   CARDIOVERSION N/A 07/28/2019   Procedure: CARDIOVERSION;  Surgeon: Kate Sable, MD;  Location: [facility];  Service: Cardiovascular;  Laterality: N/A;   CARDIOVERSION N/A 09/07/2019   Procedure: CARDIOVERSION;  Surgeon: Nelva Bush, MD;  Location: [facility];  Service: Cardiovascular;  Laterality: N/A;   CATARACT EXTRACTION W/ INTRAOCULAR LENS  IMPLANT, BILATERAL Bilateral    COLONOSCOPY     ELECTROPHYSIOLOGIC STUDY N/A 01/24/2016   Procedure: CARDIOVERSION;  Surgeon: [name], MD;  Location: [facility];  Service: Cardiovascular;  Laterality: N/A;   implantable loop recorder implantation  10/18/2019   Medtronic Reveal [place] model [id number]) implantable loop recorder    LAPAROSCOPIC CHOLECYSTECTOMY     REDUCTION MAMMAPLASTY Bilateral    TONSILLECTOMY     TOTAL HIP ARTHROPLASTY Right 05/12/2018   Procedure: TOTAL HIP ARTHROPLASTY ANTERIOR APPROACH;  Surgeon: Paralee Cancel, MD;  Location: [facility];  Service: Orthopedics;  Laterality: Right;  70 mins    SOCIAL HISTORY:   Social History   Tobacco Use   Smoking status: Never   Smokeless tobacco: Never   Tobacco comments:    Never smoke 08/30/21  Substance Use Topics   Alcohol use: Yes    Comment: a little bit of wine at bedtime to help sleep    FAMILY HISTORY:   Family History  Problem Relation Age of Onset   Cancer Mother        fallopian tube   Dementia Mother    Pulmonary embolism Mother    Hyperlipidemia Mother    Diabetes Mother    Hyperlipidemia Father    Lung cancer Father    Hypertension Sister    Diabetes Brother    Cancer Brother    Stroke Paternal Grandmother    Stroke Paternal Grandfather    Kidney cancer Son    Dementia Maternal Aunt    Colon cancer Neg Hx    Esophageal cancer Neg Hx     DRUG ALLERGIES:   Allergies  Allergen Reactions   Cortisone Shortness Of Breath and Rash   Pravachol [Pravastatin Sodium] Other (See Comments)    Leg pain    Contrast Media [Iodinated Contrast Media] Rash    REVIEW OF SYSTEMS:   ROS As per history of present illness. All pertinent systems were reviewed above. Constitutional, HEENT, cardiovascular, respiratory, GI, GU, musculoskeletal, neuro, psychiatric, endocrine,  integumentary and hematologic systems were reviewed and are otherwise negative/unremarkable except for positive findings mentioned above in the HPI.   MEDICATIONS AT HOME:   Prior to Admission medications   Medication Sig Start Date End Date Taking? Authorizing Provider  apixaban (ELIQUIS) 5 MG TABS tablet TAKE ONE TABLET BY MOUTH TWICE DAILY 11/15/21  Yes [name], MD  Ascorbic Acid (VITAMIN C PO) Take 1 tablet by mouth at bedtime.   Yes [provider]  atorvastatin (LIPITOR) 10 MG tablet TAKE ONE TABLET BY MOUTH AT BEDTIME 12/27/21  Yes Bedsole, Amy E, MD  calcium carbonate (OS-CAL - DOSED IN MG OF ELEMENTAL CALCIUM) 1250 (500 Ca) MG tablet Take 1 tablet by mouth daily with breakfast.   Yes [provider]  dapagliflozin propanediol (FARXIGA) 5 MG TABS tablet Take 1 tablet (5 mg  total) by mouth daily before breakfast. 11/28/21  Yes [name], MD  dofetilide (TIKOSYN) 250 MCG capsule TAKE 1 CAPSULE BY MOUTH TWO TIMES DAILY 11/29/21  Yes Deboraha Sprang, MD  linagliptin (TRADJENTA) 5 MG TABS tablet TAKE 1 TABLET BY MOUTH DAILY 01/30/22  Yes [name], MD  magnesium oxide (MAG-OX) 400 MG tablet Take 1 tablet (400 mg total) by mouth 2 (two) times daily. 05/11/21  Yes Shirley Friar, PA-C  metFORMIN (GLUCOPHAGE-XR) 500 MG 24 hr tablet TAKE TWO TABLETS BY MOUTH TWICE DAILY WITH MEALS 12/27/21  Yes [name], MD  metoprolol succinate (TOPROL-XL) 25 MG 24 hr tablet Take one tablet by mouth at bedtime 11/21/21  Yes Dunn, Ryan M, PA-C  MYRBETRIQ 50 MG TB24 tablet Take 50 mg by mouth daily. 11/27/20  Yes [provider]  ONETOUCH ULTRA test strip CHECK BLOOD SUGAR 2 TIMES A DAY AS DIRECTED DX CODE E11.9 08/24/20  Yes [name], MD  sertraline (ZOLOFT) 100 MG tablet Take 1 tablet (100 mg total) by mouth at bedtime. 12/04/21  Yes Bedsole, Amy E, MD  zolpidem (AMBIEN) 10 MG tablet Take 10 mg by mouth at bedtime. 02/05/22  Yes [provider]  doxylamine, Sleep, (UNISOM) 25 MG tablet Take 25 mg by mouth at bedtime as needed.    [provider]  hydroxypropyl methylcellulose / hypromellose (ISOPTO TEARS / GONIOVISC) 2.5 % ophthalmic solution Place 1 drop into both eyes 2 (two) times daily as needed for dry eyes.    [provider]      VITAL SIGNS:  Blood pressure 121/83, pulse 97, temperature 98.2 F (36.8 C), temperature source Oral, resp. rate 20, height '5\' 5"'$  (1.651 m), weight 79.4 kg, SpO2 97 %.  PHYSICAL EXAMINATION:  Physical Exam  GENERAL:  76 y.o.-year-old Caucasian female patient lying in the bed with no acute distress.  EYES: Pupils equal, round, reactive to light and accommodation. No scleral icterus. Extraocular muscles intact.  HEENT: Head atraumatic, normocephalic. Oropharynx and nasopharynx clear.  NECK:  Supple, no jugular venous distention. No thyroid enlargement, no tenderness.  LUNGS: Normal breath sounds bilaterally, no wheezing, rales,rhonchi or crepitation. No use of accessory muscles of respiration.  CARDIOVASCULAR: Irregularly irregular slightly tachycardic rhythm, S1, S2 normal. No murmurs, rubs, or gallops.  ABDOMEN: Soft, nondistended, nontender. Bowel sounds present. No organomegaly or mass.  EXTREMITIES: No pedal edema, cyanosis, or clubbing.  NEUROLOGIC: Cranial nerves II through XII are intact. Muscle strength 5/5 in all extremities. Sensation intact. Gait not checked.  PSYCHIATRIC: The patient is alert and oriented x 3.  Normal affect and good eye contact. SKIN: No obvious rash, lesion, or ulcer.   LABORATORY PANEL:   CBC Recent Labs  Lab 02/18/22 2023  WBC 6.8  HGB 11.7*  HCT 37.7  PLT 391   ------------------------------------------------------------------------------------------------------------------  Chemistries  Recent Labs  Lab 02/18/22 2023  NA 135  K 4.1  CL 102  CO2 24  GLUCOSE 220*  BUN 20  CREATININE 1.19*  CALCIUM 9.3  MG 2.1   AST 24  ALT 15  ALKPHOS 79  BILITOT 0.6   ------------------------------------------------------------------------------------------------------------------  Cardiac Enzymes No results for input(s): "TROPONINI" in the last 168 hours. ------------------------------------------------------------------------------------------------------------------  RADIOLOGY:  DG Chest Portable 1 View  Result Date: 02/18/2022 CLINICAL DATA:  Chest pain EXAM: PORTABLE CHEST 1 VIEW COMPARISON:  Chest x-ray 03/24/2021 FINDINGS: Loop recorder device overlies the left chest, unchanged. There is some minimal patchy opacities in the left lung base along the diaphragm.  No pleural effusion or pneumothorax. Cardiomediastinal silhouette within normal limits. There are degenerative changes of both shoulders. IMPRESSION: Minimal patchy opacities in the left lung base, likely atelectasis. Electronically Signed   By: Ronney Asters M.D.   On: 02/18/2022 21:02      IMPRESSION AND PLAN:  Assessment and Plan: * Atrial fibrillation with rapid ventricular response ([place]) - The patient will be admitted to a progressive unit bed. - We will can continue him on as needed IV Cardizem. - His Lopressor will be resumed as well as Eliquis.. - His Tikosyn will also be continued. - Cardiology consult will be obtained. - I notified Dr. Audie Box about the patient.   Type 2 diabetes mellitus without complications ([place]) - The patient will be placed on supplement coverage with NovoLog. - We will continue her oral antidiabetics while holding off metformin.  Depression - We will continue Zoloft.  Dyslipidemia - We will continue statin therapy.   DVT prophylaxis: Eliquis Advanced Care Planning:  Code Status: full code.  Family Communication:  The plan of care was discussed in details with the patient (and family). I answered all questions. The patient agreed to proceed with the above mentioned plan. Further management will depend  upon hospital course. Disposition Plan: Back to previous home environment Consults called: Cardiology. All the records are reviewed and case discussed with ED provider.  Status is: Inpatient    At the time of the admission, it appears that the appropriate admission status for this patient is inpatient.  This is judged to be reasonable and necessary in order to provide the required intensity of service to ensure the patient's safety given the presenting symptoms, physical exam findings and initial radiographic and laboratory data in the context of comorbid conditions.  The patient requires inpatient status due to high intensity of service, high risk of further deterioration and high frequency of surveillance required.  I certify that at the time of admission, it is my clinical judgment that the patient will require inpatient hospital care extending more than 2 midnights.                            Dispo: The patient is from: Home              Anticipated d/c is to: Home              Patient currently is not medically stable to d/c.              Difficult to place patient: No  Christel Mormon M.D on 02/19/2022 at 4:34 AM  [facility]   From 7 PM-7 AM, contact night-coverage [email or web address]  CC: Primary care physician; Jinny Sanders, MD

## 2022-02-19 NOTE — ED Notes (Signed)
Confirmed with Dr. Saunders Revel OK to give Eliquis early.

## 2022-02-19 NOTE — CV Procedure (Signed)
    Cardioversion Note  SANTIA LABATE [id number] [birth year]  Procedure: DC Cardioversion Indications: Atrial fibrillation with rapid ventricular response  Procedure Details Consent: Obtained Time Out: Verified patient identification, verified procedure, site/side was marked, verified correct patient position, special equipment/implants available, Radiology Safety Procedures followed,  medications/allergies/relevent history reviewed, required imaging and test results available.  Performed  The patient has been on adequate anticoagulation.  The patient received IV propofol by anesthesia for sedation.  Synchronous cardioversion was performed at 200 joules x 1.  The cardioversion was successful with restoration of sinus bradycardia with PAC's.  Complications: No apparent complications Patient did tolerate procedure well.  Recommendations: Continue PTA doses of metoprolol, dofetilide, and apixaban.  Follow-up with Dr. Quentin Ore in EP clinic tomorrow, as previously scheduled.  Nelva Bush., MD 02/19/2022, 10:44 AM

## 2022-02-19 NOTE — Care Management Obs Status (Signed)
[facility] NOTIFICATION   Patient Details  Name: MAYGAN KOELLER MRN: [id number] Date of Birth: 06-28-1945   Medicare Observation Status Notification Given:  Yes    Shelbie Hutching, RN 02/19/2022, 1:39 PM

## 2022-02-19 NOTE — Assessment & Plan Note (Signed)
statin

## 2022-02-19 NOTE — Assessment & Plan Note (Signed)
Resume metformin at d/c

## 2022-02-19 NOTE — Consult Note (Signed)
Cardiology Consultation   Patient ID: [name] MRN: [id number]; DOB: 23-Mar-1945  Admit date: 02/18/2022 Date of Consult: 02/19/2022  PCP:  Emily Sanders, MD   [place] [facility] Cardiologist:  Kathlyn Sacramento, MD  Electrophysiologist:  Virl Axe, MD       Patient Profile:   [name] is a 76 y.o. female with a hx of hypertension, hyperlipidemia, persistent atrial fibrillation, type II diabetes, hypothyroidism, OSA on CPAP, who is being seen 02/19/2022 for the evaluation of atrial fibrillation RVR  at the request of Dr. Sidney Ace.  History of Present Illness:   [name] is a 76 year old female with a history of persistent atrial fibrillation on apixaban that was started post cardioversion 01/2016, 05/2016, and 11/2016 status post radiofrequency catheter ablation 12/17/2016 status post repeat DCCV 09/07/2019 followed by EP/A-fib clinic, atrial tachycardia status post DCCV 09/18/2018, pulmonary hypertension, mitral valve prolapse with mitral regurgitation, type 2 diabetes, hypertension, hyperlipidemia, hypothyroidism, anemia, obesity, OSA on CPAP, diverticulosis, gastroesophageal reflux disease, anxiety.  She was last seen in clinic 11/21/2021 by Dr. Caryl Comes which she had been treated with flecainide 100 mg twice daily and continued on apixaban 5 mg twice daily.  After that time she developed palpitations and was seen in the emergency department and underwent cardioversion.  She did have recurrent falls when she had gone into atrial fibrillation and ended up in the ER 04/2021.  She also had atrial tachycardia identified 5 RUE/PA for which her loop recorder was reprogrammed and she had multiple episodes of tachycardia with ventricular rate about 130/140, regular suggestive of atrial flutter perhaps post ablation.  Her flecainide was discontinued to the to be an adequate and she was started on Tikosyn.  See significantly improved initially on Tikosyn, however recently has had  increasing problems with palpitations particularly in the evening hours making sleep difficult.  Associated lightheadedness.  Also noted worsening fatigue which she attributes to the Tikosyn.  She did have her ILR implanted 2021 which demonstrated atrial tachycardia/fibrillation burden of 7.8% maximum rates up to 200+.  Last device interrogation was on 01/13/2022 which revealed the battery status is okay with normal device function, 6 tachycardia events,.  Commendation for sinus tachycardia likely atrial fibrillation with RVR, 653 A. tach episodes, 107 A-fib episodes with a burden of 20.8%.  At the time she was continued on Eliquis and Tikosyn.  She presented to the [facility] emergency department on 02/18/2022 with a complaint of palpitations associated with mild chest pain, and exertional dyspnea.  She stated prior to her episode she was in her regular state of health.  She also states that she has been compliant with all of her medications with the exception of cholesterol medications.  On arrival she was noted to be atrial fibrillation with heart rates of 150s-170s.  She stated she had recently been treated for UTI from urgent care.  She also stated that she has an upcoming appointment with the EP Dr. Quentin Ore on Wednesday of this week.  Initial vitals: Blood pressure 122/96, pulse 150s-170s, respirations 18, temperature 98.2  Pertinent labs: Blood glucose of 220, serum creatinine 1.19, GFR 48, hemoglobin 11.7, TSH 3.291, 70 troponins 11 and 18, respiratory panel is negative, magnesium 2.1  Medications administered in the emergency department:adenosine 6 mg followed by adenosine at 12 mg and metoprolol tartrate 5 mg IV push 4 times and given metoprolol to tartrate 50 mg tablet,  Past Medical History:  Diagnosis Date   Anemia    Anxiety  Atrial fibrillation (HCC)    Colon polyps    Constipation    Depressive disorder, not elsewhere classified    Diabetes mellitus without complication ([place])     Diaphragmatic hernia without mention of obstruction or gangrene    Disorders of bursae and tendons in shoulder region, unspecified    Diverticulosis of colon (without mention of hemorrhage)    External hemorrhoid    Fundic gland polyps of stomach, benign    GERD (gastroesophageal reflux disease)    History of hiatal hernia    HTN (hypertension)    Hypertension    Hypothyroidism    Internal hemorrhoids without mention of complication    Moderate mitral regurgitation    Obesity, unspecified    OSA on CPAP    compliant with CPAP   Other urinary incontinence    Persistent atrial fibrillation ([place])    Pure hypercholesterolemia    Type II diabetes mellitus ([place])    Urinary frequency    Urinary urgency    Vitamin B 12 deficiency     Past Surgical History:  Procedure Laterality Date   ABDOMINAL HYSTERECTOMY     ABDOMINAL HYSTERECTOMY  1972   Have both ovaries   ATRIAL FIBRILLATION ABLATION  12/17/2016   ATRIAL FIBRILLATION ABLATION N/A 12/17/2016   Procedure: ATRIAL FIBRILLATION ABLATION;  Surgeon: Thompson Grayer, MD;  Location: [facility];  Service: Cardiovascular;  Laterality: N/A;   [place]   [facility]; "Dr. [name]"   CARDIOVERSION N/A 05/27/2016   Procedure: CARDIOVERSION;  Surgeon: [name], MD;  Location: [facility];  Service: Cardiovascular;  Laterality: N/A;   CARDIOVERSION N/A 11/25/2016   Procedure: CARDIOVERSION;  Surgeon: [name], MD;  Location: [facility];  Service: Cardiovascular;  Laterality: N/A;   CARDIOVERSION N/A 09/18/2018   Procedure: CARDIOVERSION;  Surgeon: [name], MD;  Location: [facility];  Service: Cardiovascular;  Laterality: N/A;   CARDIOVERSION N/A 07/28/2019   Procedure: CARDIOVERSION;  Surgeon: Kate Sable, MD;  Location: [facility];  Service: Cardiovascular;  Laterality: N/A;   CARDIOVERSION N/A 09/07/2019   Procedure: CARDIOVERSION;  Surgeon: Nelva Bush, MD;  Location: [facility];  Service:  Cardiovascular;  Laterality: N/A;   CATARACT EXTRACTION W/ INTRAOCULAR LENS  IMPLANT, BILATERAL Bilateral    COLONOSCOPY     ELECTROPHYSIOLOGIC STUDY N/A 01/24/2016   Procedure: CARDIOVERSION;  Surgeon: [name], MD;  Location: [facility];  Service: Cardiovascular;  Laterality: N/A;   implantable loop recorder implantation  10/18/2019   Medtronic Reveal [place] model [phone] [id number]) implantable loop recorder    LAPAROSCOPIC CHOLECYSTECTOMY     REDUCTION MAMMAPLASTY Bilateral    TONSILLECTOMY     TOTAL HIP ARTHROPLASTY Right 05/12/2018   Procedure: TOTAL HIP ARTHROPLASTY ANTERIOR APPROACH;  Surgeon: Paralee Cancel, MD;  Location: [facility];  Service: Orthopedics;  Laterality: Right;  70 mins     Home Medications:  Prior to Admission medications   Medication Sig Start Date End Date Taking? Authorizing Provider  apixaban (ELIQUIS) 5 MG TABS tablet TAKE ONE TABLET BY MOUTH TWICE DAILY 11/15/21  Yes [name], MD  Ascorbic Acid (VITAMIN C PO) Take 1 tablet by mouth at bedtime.   Yes [provider]  atorvastatin (LIPITOR) 10 MG tablet TAKE ONE TABLET BY MOUTH AT BEDTIME 12/27/21  Yes Bedsole, Amy E, MD  calcium carbonate (OS-CAL - DOSED IN MG OF ELEMENTAL CALCIUM) 1250 (500 Ca) MG tablet Take 1 tablet by mouth daily with breakfast.   Yes [provider]  dapagliflozin propanediol (FARXIGA) 5 MG TABS tablet Take 1 tablet (5 mg total) by mouth daily before breakfast. 11/28/21  Yes [name], MD  dofetilide (TIKOSYN) 250 MCG capsule TAKE 1 CAPSULE BY MOUTH TWO TIMES DAILY 11/29/21  Yes Deboraha Sprang, MD  linagliptin (TRADJENTA) 5 MG TABS tablet TAKE 1 TABLET BY MOUTH DAILY 01/30/22  Yes [name], MD  magnesium oxide (MAG-OX) 400 MG tablet Take 1 tablet (400 mg total) by mouth 2 (two) times daily. 05/11/21  Yes Shirley Friar, PA-C  metFORMIN (GLUCOPHAGE-XR) 500 MG 24 hr tablet TAKE TWO TABLETS BY MOUTH TWICE DAILY WITH MEALS 12/27/21  Yes [name], MD  metoprolol succinate (TOPROL-XL) 25 MG 24 hr tablet Take one tablet by mouth at bedtime 11/21/21  Yes Dunn, Ryan M, PA-C  MYRBETRIQ 50 MG TB24 tablet Take 50 mg by mouth daily. 11/27/20  Yes [provider]  ONETOUCH ULTRA test strip CHECK BLOOD SUGAR 2 TIMES A DAY AS DIRECTED DX CODE E11.9 08/24/20  Yes [name], MD  sertraline (ZOLOFT) 100 MG tablet Take 1 tablet (100 mg total) by mouth at bedtime. 12/04/21  Yes Bedsole, Amy E, MD  zolpidem (AMBIEN) 10 MG tablet Take 10 mg by mouth at bedtime. 02/05/22  Yes [provider]  doxylamine, Sleep, (UNISOM) 25 MG tablet Take 25 mg by mouth at bedtime as needed.    [provider]  hydroxypropyl methylcellulose / hypromellose (ISOPTO TEARS / GONIOVISC) 2.5 % ophthalmic solution Place 1 drop into both eyes 2 (two) times daily as needed for dry eyes.    [provider]    Inpatient Medications: Scheduled Meds:  adenosine (ADENOCARD) IV  6 mg Intravenous Once   apixaban  5 mg Oral BID   atorvastatin  10 mg Oral QHS   calcium carbonate  1 tablet Oral Q breakfast   dapagliflozin propanediol  5 mg Oral QAC breakfast   dofetilide  250 mcg Oral BID   linagliptin  5 mg Oral Daily   magnesium oxide  400 mg Oral BID   metoprolol succinate  25 mg Oral Daily   metoprolol tartrate       mirabegron ER  50 mg Oral Daily   sertraline  100 mg Oral QHS   zolpidem  5 mg Oral QHS   Continuous Infusions:  sodium chloride 100 mL/hr at 02/19/22 0840   cefTRIAXone (ROCEPHIN)  IV     PRN Meds: acetaminophen **OR** acetaminophen, magnesium hydroxide, metoprolol tartrate, ondansetron **OR** ondansetron (ZOFRAN) IV, polyvinyl alcohol, traZODone  Allergies:    Allergies  Allergen Reactions   Cortisone Shortness Of Breath and Rash   Pravachol [Pravastatin Sodium] Other (See Comments)    Leg pain    Contrast Media [Iodinated Contrast Media] Rash    Social History:   Social History   Socioeconomic History    Marital status: Married    Spouse name: Jimmy   Number of children: 2   Years of education: Not on file   Highest education level: Not on file  Occupational History   Occupation: retired  Tobacco Use   Smoking status: Never   Smokeless tobacco: Never   Tobacco comments:    Never smoke 08/30/21  Vaping Use   Vaping Use: Never used  Substance and Sexual Activity   Alcohol use: Yes    Comment: a little bit of wine at bedtime to help sleep   Drug use: No   Sexual activity: Yes  Other Topics Concern  Not on file  Social History Narrative   ** Merged History Encounter **       Exercise walks 3 times daily Lives with spouse in [name] - retired from employment 1 son and 1 daughter  Diet fruit, occ fast food, salads and lean meat  3 tea/day, no EtOH, no tobacco, no drugs    Social Determinants of Health   Financial Resource Strain: Low Risk  (08/31/2021)   Overall Financial Resource Strain (CARDIA)    Difficulty of Paying Living Expenses: Not hard at all  Food Insecurity: No Food Insecurity (08/31/2021)   Hunger Vital Sign    Worried About Running Out of Food in the Last Year: Never true    [place] in the Last Year: Never true  Transportation Needs: No Transportation Needs (08/31/2021)   PRAPARE - Hydrologist (Medical): No    Lack of Transportation (Non-Medical): No  Physical Activity: Unknown (08/31/2021)   Exercise Vital Sign    Days of Exercise per Week: 3 days    Minutes of Exercise per Session: Not on file  Stress: No Stress Concern Present (08/31/2021)   [place]    Feeling of Stress : Only a little  Social Connections: Socially Integrated (08/31/2021)   Social Connection and Isolation Panel [NHANES]    Frequency of Communication with Friends and Family: More than three times a week    Frequency of Social Gatherings with Friends and Family: More  than three times a week    Attends Religious Services: More than 4 times per year    Active Member of [employer] or Organizations: Yes    Attends Music therapist: More than 4 times per year    Marital Status: Married  Human resources officer Violence: Not At Risk (08/31/2021)   Humiliation, Afraid, Rape, and Kick questionnaire    Fear of Current or Ex-Partner: No    Emotionally Abused: No    Physically Abused: No    Sexually Abused: No    Family History:    Family History  Problem Relation Age of Onset   Cancer Mother        fallopian tube   Dementia Mother    Pulmonary embolism Mother    Hyperlipidemia Mother    Diabetes Mother    Hyperlipidemia Father    Lung cancer Father    Hypertension Sister    Diabetes Brother    Cancer Brother    Stroke Paternal Grandmother    Stroke Paternal Grandfather    Kidney cancer Son    Dementia Maternal Aunt    Colon cancer Neg Hx    Esophageal cancer Neg Hx      ROS:  Please see the history of present illness.  Review of Systems  Constitutional:  Positive for malaise/fatigue.  Respiratory:  Positive for shortness of breath.   Cardiovascular:  Positive for chest pain and palpitations.  Genitourinary:  Positive for frequency and urgency.  Musculoskeletal:  Positive for joint pain.  Psychiatric/Behavioral:  The patient has insomnia.     All other ROS reviewed and negative.     Physical Exam/Data:   Vitals:   02/19/22 0400 02/19/22 0515 02/19/22 0600 02/19/22 0800  BP: 108/63  (!) 101/57 120/70  Pulse: 72  78 72  Resp: '16  15 19  '$ Temp:  98.4 F (36.9 C)  97.8 F (36.6 C)  TempSrc:  Oral  Oral  SpO2: 97%  98% 98%  Weight:      Height:       No intake or output data in the 24 hours ending 02/19/22 0901    02/18/2022    8:19 PM 12/19/2021    8:34 AM 11/29/2021    9:02 AM  Last 3 Weights  Weight (lbs) 175 lb 178 lb 2 oz 176 lb 4 oz  Weight (kg) 79.379 kg 80.797 kg 79.946 kg     Body mass index is 29.12 kg/m.   General:  Well nourished, well developed, in no acute distress HEENT: normal Neck: no JVD Vascular: No carotid bruits; Distal pulses 2+ bilaterally Cardiac:  normal S1, S2; irregularly irregular; no murmur  Lungs:  clear to auscultation bilaterally, no wheezing, rhonchi or rales  Abd: soft, nontender, no hepatomegaly  Ext: no edema Musculoskeletal:  No deformities, BUE and BLE strength normal and equal Skin: warm and dry  Neuro:  CNs 2-12 intact, no focal abnormalities noted Psych:  Normal affect   EKG:  The EKG was personally reviewed and demonstrates: On arrival she was atrial fibrillation with a rate of 174, repeat showed atrial fibrillation that was rate controlled Telemetry:  Telemetry was personally reviewed and demonstrates: Rate controlled atrial fibrillation rate 80 to 90s  Relevant CV Studies: TTE 01/03/2022 1. Left ventricular ejection fraction, by estimation, is 55 to 60%. The  left ventricle has normal function. The left ventricle has no regional  wall motion abnormalities. Left ventricular diastolic parameters are  consistent with Grade II diastolic  dysfunction (pseudonormalization).   2. Right ventricular systolic function is normal. The right ventricular  size is normal. There is normal pulmonary artery systolic pressure. The  estimated right ventricular systolic pressure is 62.7 mmHg.   3. Left atrial size was severely dilated.   4. The mitral valve is normal in structure. Mild mitral valve  regurgitation. No evidence of mitral stenosis.   5. The aortic valve is tricuspid. Aortic valve regurgitation is not  visualized. No aortic stenosis is present.   6. The inferior vena cava is normal in size with greater than 50%  respiratory variability, suggesting right atrial pressure of 3 mmHg.   MPI 11/16/2021  Pharmacological myocardial perfusion imaging study with no significant  ischemia GI uptake artifact noted Normal wall motion, EF estimated at 75% No EKG changes  concerning for ischemia at peak stress or in recovery. CT attenuation correction images with no significant coronary calcification, minimal aortic atherosclerosis Low risk scan   Zio patch 03/2021: Patient had a min HR of 45 bpm, max HR of 166 bpm, and avg HR of 93 bpm. Predominant underlying rhythm was Atrial Fibrillation. First Degree AV Block was present. 3 Supraventricular Tachycardia runs occurred, the run with the fastest interval lasting  14.1 secs with a max rate of 146 bpm (avg 126 bpm); the run with the fastest interval was also the longest. Atrial Fibrillation occurred (56% burden), ranging from 56-166 bpm (avg of 119 bpm), the longest lasting 6 days 13 hours with an avg rate of 120  bpm. Atrial Fibrillation was detected within +/- 45 seconds of symptomatic patient event(s). Isolated SVEs were rare (<1.0%), SVE Couplets were rare (<1.0%), and SVE Triplets were rare (<1.0%). Isolated VEs were rare (<1.0%), VE Couplets were rare  (<1.0%), and no VE Triplets were present. Difficulty discerning atrial activity making definitive diagnosis difficult to ascertain.   1. NSR with sinus tachy and sinus bradycardia 2. Periods of SVT up to 145/min at times transitioning to  atrial fib vs SVT with block 3. No VT 4. Rare PVC's and occaisional PAC's Laboratory Data:  High Sensitivity Troponin:   Recent Labs  Lab 02/18/22 2023 02/18/22 2245  TROPONINIHS 11 18*     Chemistry Recent Labs  Lab 02/18/22 2023 02/19/22 0508  NA 135 138  K 4.1 4.5  CL 102 111  CO2 24 23  GLUCOSE 220* 205*  BUN 20 21  CREATININE 1.19* 0.89  CALCIUM 9.3 9.1  MG 2.1  --   GFRNONAA 48* >60  ANIONGAP 9 4*    Recent Labs  Lab 02/18/22 2023  PROT 7.6  ALBUMIN 4.1  AST 24  ALT 15  ALKPHOS 79  BILITOT 0.6   Lipids No results for input(s): "CHOL", "TRIG", "HDL", "LABVLDL", "LDLCALC", "CHOLHDL" in the last 168 hours.  Hematology Recent Labs  Lab 02/18/22 2023 02/19/22 0508  WBC 6.8 6.5  RBC 4.24 3.87   HGB 11.7* 10.5*  HCT 37.7 34.0*  MCV 88.9 87.9  MCH 27.6 27.1  MCHC 31.0 30.9  RDW 14.2 14.2  PLT 391 336   Thyroid  Recent Labs  Lab 02/18/22 2245  TSH 3.291  FREET4 0.74    BNPNo results for input(s): "BNP", "PROBNP" in the last 168 hours.  DDimer No results for input(s): "DDIMER" in the last 168 hours.   Radiology/Studies:  DG Chest Portable 1 View  Result Date: 02/18/2022 CLINICAL DATA:  Chest pain EXAM: PORTABLE CHEST 1 VIEW COMPARISON:  Chest x-ray 03/24/2021 FINDINGS: Loop recorder device overlies the left chest, unchanged. There is some minimal patchy opacities in the left lung base along the diaphragm. No pleural effusion or pneumothorax. Cardiomediastinal silhouette within normal limits. There are degenerative changes of both shoulders. IMPRESSION: Minimal patchy opacities in the left lung base, likely atelectasis. Electronically Signed   By: Ronney Asters M.D.   On: 02/18/2022 21:02     Assessment and Plan:   Persistent atrial fibrillation with RVR -Currently rate controlled atrial fibrillation 80s to 90s on the bedside cardiac monitor -She has been continued on Tikosyn -Continued on Toprol XL 25 mg daily -She is continued on apixaban 5 mg twice daily for CHA2DS2-VASc score of at least 4, patient states she has been compliant and has not missed any doses. -Electrolytes and TSH WNL -After discussion about her being symptomatic to the atrial fibrillation she has been scheduled for repeat DCCV later on this morning. -She has remained n.p.o. -Continue with cardiac monitoring -Continue to keep appoint with Dr. Quentin Ore later this week  Essential hypertension -Blood pressure 120/70 -Continue metoprolol succinate 25 mg daily -Vital signs per unit protocol  Hyperlipidemia -LDL 73 -Continued on atorvastatin 10 mg daily  Type 2 diabetes -Hemoglobin A1c -Currently receiving Farxiga -PTA metformin on hold -Management per primary team   Shared Decision  Making/Informed Consent The risks (stroke, cardiac arrhythmias rarely resulting in the need for a temporary or permanent pacemaker, skin irritation or burns and complications associated with conscious sedation including aspiration, arrhythmia, respiratory failure and death), benefits (restoration of normal sinus rhythm) and alternatives of a direct current cardioversion were explained in detail to Ms. Conradt and she agrees to proceed.     Risk Assessment/Risk Scores:          CHA2DS2-VASc Score = 4   This indicates a 4.8% annual risk of stroke. The patient's score is based upon: CHF History: 0 HTN History: 1 Diabetes History: 1 Stroke History: 0 Vascular Disease History: 0 Age Score: 1 Gender Score: 1  For questions or updates, please contact [place] Please consult [email or web address] for contact info under    Signed, Travus Oren, NP  02/19/2022 9:01 AM

## 2022-02-19 NOTE — Discharge Summary (Signed)
Physician Discharge Summary   Patient: [name]: [id number] DOB: 03/27/1945  Admit date:     02/18/2022  Discharge date: 02/20/22  Discharge Physician: Ezekiel Slocumb   PCP: Jinny Sanders, MD   Recommendations at discharge:   Follow up as scheduled tomorrow with Dr. Quentin Ore Recommend urology follow up if ongoing urinary frequency / urgency despite antibiotics for possible recurrent UTI Follow up with Primary Care Repeat BMP, CBC, Mg in 1-2 weeks  Discharge Diagnoses: Principal Problem:   Atrial fibrillation with rapid ventricular response ([place]) Active Problems:   Type 2 diabetes mellitus without complications ([place])   Dyslipidemia   Depression  Resolved Problems:   * No resolved hospital problems. *  Hospital Course:  HPI on admission by Dr. Sidney Ace: "[name] is a 76 y.o. female with medical history significant for paroxysmal atrial fibrillation on Eliquis, diverticulosis, GERD, hypertension, hypothyroidism, OSA on CPAP, type 2 diabetes mellitus, who presented to the emergency room with a [place] of palpitations with a feeling of fast heart beating and irregularity with mild chest discomfort.  She denied any associated nausea or vomiting or diaphoresis.  No dyspnea or cough or wheezing or hemoptysis.  No leg pain or edema or recent travels or surgeries.  No paresthesias or focal muscle weakness.  No dysuria, oliguria or hematuria or flank pain.  No bleeding diathesis.   ED Course: When she came to the ER, heart rate was up to 175 with BP 122/96 and later 143/83 and respiratory rate of 24.  CMP was remarkable for blood glucose of 220 and creatinine 1.19 and high sensitive troponin was 11.  CBC showed hemoglobin 11.7 hematocrit 37.7. EKG as reviewed by me : Initial EKG showed supraventricular tachycardia with rate of 174 with ST segment depression in anterolateral and inferior leads. Imaging: Portable chest ray showed minimal patchy opacities in the left lung base  likely atelectasis.   The patient was given 5 mg of IV Lopressor X 3 and 50 mg of p.o. Lopressor as well as 1 L bolus of IV normal saline.  She will be admitted to a progressive unit bed for further evaluation and management."  Cardiology reviewed initial EKG, felt more consistent with SVT vs atypical A-flutter.     Assessment and Plan: * Atrial fibrillation with rapid ventricular response [facility]) Cardiology consulted.  Patient underwent cardioversion successfully this AM.  Symptoms improved. Continue Lopressor, Tikosyn, Eliquis Patient has follow up with Dr. Quentin Ore tomorrow.   Type 2 diabetes mellitus without complications (H[facility]) Resume metformin at d/c  Depression Zoloft  Dyslipidemia statin   Recurrent UTI's - pt had completed course of Keflex a couple days ago for UTI, but reports ongoing symptoms of urgency / frequency, not improved.  Outpatient culture reviewed, Klebsiella should have been sensitive to Keflex.   UA here some signs infection, sent for culture - pending. Omnicef prescribed. Recommend urology follow up to evaluate underlying etiology of recurrent UTI's and/or other cause of urgency / frequency.       Consultants: Cardiology Procedures performed: Cardioversion  Disposition: Home Diet recommendation:  Discharge Diet Orders (From admission, onward)     Start     Ordered   02/19/22 0000  Diet - low sodium heart healthy        02/19/22 1250           Cardiac diet DISCHARGE MEDICATION: Allergies as of 02/19/2022       Reactions   Cortisone Shortness Of Breath, Rash  Pravachol [pravastatin Sodium] Other (See Comments)   Leg pain   Contrast Media [iodinated Contrast Media] Rash        Medication List     STOP taking these medications    cephALEXin 500 MG capsule Commonly known as: KEFLEX       TAKE these medications    atorvastatin 10 MG tablet Commonly known as: LIPITOR TAKE ONE TABLET BY MOUTH AT BEDTIME   calcium carbonate  1250 (500 Ca) MG tablet Commonly known as: OS-CAL - dosed in mg of elemental calcium Take 1 tablet by mouth daily with breakfast.   cefdinir 300 MG capsule Commonly known as: OMNICEF Take 1 capsule (300 mg total) by mouth 2 (two) times daily for 5 days.   dapagliflozin propanediol 5 MG Tabs tablet Commonly known as: Farxiga Take 1 tablet (5 mg total) by mouth daily before breakfast.   dofetilide 250 MCG capsule Commonly known as: TIKOSYN TAKE 1 CAPSULE BY MOUTH TWO TIMES DAILY   doxylamine (Sleep) 25 MG tablet Commonly known as: UNISOM Take 25 mg by mouth at bedtime as needed.   Eliquis 5 MG Tabs tablet Generic drug: apixaban TAKE ONE TABLET BY MOUTH TWICE DAILY   hydroxypropyl methylcellulose / hypromellose 2.5 % ophthalmic solution Commonly known as: ISOPTO TEARS / GONIOVISC Place 1 drop into both eyes 2 (two) times daily as needed for dry eyes.   magnesium oxide 400 MG tablet Commonly known as: MAG-OX Take 1 tablet (400 mg total) by mouth 2 (two) times daily.   metFORMIN 500 MG 24 hr tablet Commonly known as: GLUCOPHAGE-XR TAKE TWO TABLETS BY MOUTH TWICE DAILY WITH MEALS   metoprolol succinate 25 MG 24 hr tablet Commonly known as: TOPROL-XL Take one tablet by mouth at bedtime   Myrbetriq 50 MG Tb24 tablet Generic drug: mirabegron ER Take 50 mg by mouth daily.   OneTouch Ultra test strip Generic drug: glucose blood CHECK BLOOD SUGAR 2 TIMES A DAY AS DIRECTED DX CODE E11.9   sertraline 100 MG tablet Commonly known as: ZOLOFT Take 1 tablet (100 mg total) by mouth at bedtime.   Tradjenta 5 MG Tabs tablet Generic drug: linagliptin TAKE 1 TABLET BY MOUTH DAILY   VITAMIN C PO Take 1 tablet by mouth at bedtime.   zolpidem 10 MG tablet Commonly known as: AMBIEN Take 10 mg by mouth at bedtime.        Follow-up Information     Bedsole, Amy E, MD Follow up in 9 day(s).   Specialty: Family Medicine Why: To see Dr. Owens Loffler, Thursday 02/28/2022 at  9:40AM. Contact information: [place] [address] [phone]         Bjorn Loser, MD Follow up in 7 day(s).   Specialty: Urology Why: To see Daine Gravel, DNP, AGNP-C, Tuesday 02/26/2022 at 11:00AM. Contact information: [place] [address] [phone]                Discharge Exam: [place] [employer]   02/18/22 2019 02/19/22 1019  Weight: 79.4 kg 77.1 kg   General exam: awake, alert, no acute distress HEENT: atraumatic, clear conjunctiva, anicteric sclera, moist mucus membranes, hearing grossly normal  Respiratory system: CTAB, no wheezes, rales or rhonchi, normal respiratory effort. Cardiovascular system: normal S1/S2, RRR, no JVD, murmurs, rubs, gallops,  no pedal edema.   Gastrointestinal system: soft, NT, ND, no HSM felt, +bowel sounds. Central nervous system: A&O x4. no gross focal neurologic deficits, normal speech Extremities: moves all ,  no edema, normal tone Skin: dry, intact, normal temperature, normal color, No rashes, lesions or ulcers Psychiatry: normal mood, congruent affect, judgement and insight appear normal   Condition at discharge: stable  The results of significant diagnostics from this hospitalization (including imaging, microbiology, ancillary and laboratory) are listed below for reference.   Imaging Studies: CUP PACEART REMOTE DEVICE CHECK  Result Date: 02/19/2022 ILR summary report received. Battery status OK. Normal device function. No new symptom, brady, or pause episodes. No new Monthly summary reports and ROV/PRN 17 tachy events, appear combination of ST and likely AF with rapid response, longest duration 66mn 27sec, mean HR 167 511 AT episodes 241 AF episodes, burden 21.1%, stable, Eliquis, Tikosyn LA  DG Chest Portable 1 View  Result Date: 02/18/2022 CLINICAL DATA:  Chest pain EXAM: PORTABLE CHEST 1 VIEW COMPARISON:  Chest x-ray 03/24/2021 FINDINGS: Loop recorder device overlies the left  chest, unchanged. There is some minimal patchy opacities in the left lung base along the diaphragm. No pleural effusion or pneumothorax. Cardiomediastinal silhouette within normal limits. There are degenerative changes of both shoulders. IMPRESSION: Minimal patchy opacities in the left lung base, likely atelectasis. Electronically Signed   By: ARonney AstersM.D.   On: 02/18/2022 21:02    Microbiology: Results for orders placed or performed during the hospital encounter of 02/18/22  Resp panel by RT-PCR (RSV, Flu A&B, Covid) Anterior Nasal Swab     Status: None   Collection Time: 02/18/22 10:45 PM   Specimen: Anterior Nasal Swab  Result Value Ref Range Status   SARS Coronavirus 2 by RT PCR NEGATIVE NEGATIVE Final    Comment: (NOTE) SARS-CoV-2 target nucleic acids are NOT DETECTED.  The SARS-CoV-2 RNA is generally detectable in upper respiratory specimens during the acute phase of infection. The lowest concentration of SARS-CoV-2 viral copies this assay can detect is 138 copies/mL. A negative result does not preclude SARS-Cov-2 infection and should not be used as the sole basis for treatment or other patient management decisions. A negative result may occur with  improper specimen collection/handling, submission of specimen other than nasopharyngeal swab, presence of viral mutation(s) within the areas targeted by this assay, and inadequate number of viral copies(<138 copies/mL). A negative result must be combined with clinical observations, patient history, and epidemiological information. The expected result is Negative.  Fact Sheet for Patients:  hEntrepreneurPulse.com.au Fact Sheet for Healthcare Providers:  hIncredibleEmployment.be This test is no t yet approved or cleared by the [place] and  has been authorized for detection and/or diagnosis of SARS-CoV-2 by FDA under an Emergency Use Authorization (EUA). This EUA will remain  in  effect (meaning this test can be used) for the duration of the COVID-19 declaration under Section 564(b)(1) of the Act, 21 U.S.C.section 360bbb-3(b)(1), unless the authorization is terminated  or revoked sooner.       Influenza A by PCR NEGATIVE NEGATIVE Final   Influenza B by PCR NEGATIVE NEGATIVE Final    Comment: (NOTE) The Xpert Xpress SARS-CoV-2/FLU/RSV plus assay is intended as an aid in the diagnosis of influenza from Nasopharyngeal swab specimens and should not be used as a sole basis for treatment. Nasal washings and aspirates are unacceptable for Xpert Xpress SARS-CoV-2/FLU/RSV testing.  Fact Sheet for Patients: hEntrepreneurPulse.com.au Fact Sheet for Healthcare Providers: hIncredibleEmployment.be This test is not yet approved or cleared by the [place] and has been authorized for detection and/or diagnosis of SARS-CoV-2 by FDA under an Emergency Use Authorization (EUA). This  EUA will remain in effect (meaning this test can be used) for the duration of the COVID-19 declaration under Section 564(b)(1) of the Act, 21 U.S.C. section 360bbb-3(b)(1), unless the authorization is terminated or revoked.     Resp Syncytial Virus by PCR NEGATIVE NEGATIVE Final    Comment: (NOTE) Fact Sheet for Patients: EntrepreneurPulse.com.au  Fact Sheet for Healthcare Providers: IncredibleEmployment.be  This test is not yet approved or cleared by the [place] FDA and has been authorized for detection and/or diagnosis of SARS-CoV-2 by FDA under an Emergency Use Authorization (EUA). This EUA will remain in effect (meaning this test can be used) for the duration of the COVID-19 declaration under Section 564(b)(1) of the Act, 21 U.S.C. section 360bbb-3(b)(1), unless the authorization is terminated or revoked.  Performed at [facility], [address] [place], [place] [address]      Labs: CBC: Recent Labs  Lab 02/18/22 2023 02/19/22 0508  WBC 6.8 6.5  NEUTROABS 3.8  --   HGB 11.7* 10.5*  HCT 37.7 34.0*  MCV 88.9 87.9  PLT 391 448   Basic Metabolic Panel: Recent Labs  Lab 02/18/22 2023 02/19/22 0508  NA 135 138  K 4.1 4.5  CL 102 111  CO2 24 23  GLUCOSE 220* 205*  BUN 20 21  CREATININE 1.19* 0.89  CALCIUM 9.3 9.1  MG 2.1  --    Liver Function Tests: Recent Labs  Lab 02/18/22 2023  AST 24  ALT 15  ALKPHOS 79  BILITOT 0.6  PROT 7.6  ALBUMIN 4.1   CBG: Recent Labs  Lab 02/19/22 0047 02/19/22 0816  GLUCAP 156* 134*    Discharge time spent: greater than 30 minutes.  Signed: Ezekiel Slocumb, DO [facility] 02/20/2022

## 2022-02-19 NOTE — Assessment & Plan Note (Signed)
Zoloft

## 2022-02-20 ENCOUNTER — Encounter: Payer: Self-pay | Admitting: Internal Medicine

## 2022-02-20 ENCOUNTER — Ambulatory Visit: Payer: Medicare HMO | Attending: Cardiology | Admitting: Cardiology

## 2022-02-20 VITALS — BP 130/62 | HR 72 | Ht 65.0 in | Wt 182.8 lb

## 2022-02-20 DIAGNOSIS — I4819 Other persistent atrial fibrillation: Secondary | ICD-10-CM

## 2022-02-20 LAB — URINE CULTURE

## 2022-02-20 NOTE — Progress Notes (Signed)
Electrophysiology Office Note:    Date:  02/20/2022   ID:  [name], DOB [birth year], MRN [id number]  PCP:  Jinny Sanders, MD  [facility] Cardiologist:  Kathlyn Sacramento, MD  [facility] Electrophysiologist:  Virl Axe, MD   Referring MD: Deboraha Sprang, MD   Chief Complaint: Atrial fibrillation  History of Present Illness:    [name] is a 76 y.o. female who presents for an evaluation of atrial fibrillation at the request of Dr. Caryl Comes. Their medical history includes atrial fibrillation post catheter ablation and Tikosyn, hypertension, hyperlipidemia, diabetes, hypothyroidism, obstructive sleep apnea on CPAP.  The patient was hospitalized yesterday with atrial fibrillation with rapid ventricular rates.  This was in the setting of a recent diagnosis of UTI being treated with Keflex.  Her ventricular rates were elevated at 123 during the cardiology evaluation yesterday.  She had a cardioversion yesterday.  She last saw Dr. Caryl Comes November 29, 2021.  He was referring to me to discuss possible repeat catheter ablation given her ongoing trouble with atrial fibrillation and flutters.     Past Medical History:  Diagnosis Date   Anemia    Anxiety    Atrial fibrillation (HCC)    Colon polyps    Constipation    Depressive disorder, not elsewhere classified    Diabetes mellitus without complication ([place])    Diaphragmatic hernia without mention of obstruction or gangrene    Disorders of bursae and tendons in shoulder region, unspecified    Diverticulosis of colon (without mention of hemorrhage)    External hemorrhoid    Fundic gland polyps of stomach, benign    GERD (gastroesophageal reflux disease)    History of hiatal hernia    HTN (hypertension)    Hypertension    Hypothyroidism    Internal hemorrhoids without mention of complication    Moderate mitral regurgitation    Obesity, unspecified    OSA on CPAP    compliant with CPAP   Other urinary incontinence     Persistent atrial fibrillation ([place])    Pure hypercholesterolemia    Type II diabetes mellitus ([place])    Urinary frequency    Urinary urgency    Vitamin B 12 deficiency     Past Surgical History:  Procedure Laterality Date   ABDOMINAL HYSTERECTOMY     ABDOMINAL HYSTERECTOMY  1972   Have both ovaries   ATRIAL FIBRILLATION ABLATION  12/17/2016   ATRIAL FIBRILLATION ABLATION N/A 12/17/2016   Procedure: ATRIAL FIBRILLATION ABLATION;  Surgeon: Thompson Grayer, MD;  Location: [place] [facility];  Service: Cardiovascular;  Laterality: N/A;   [place]   [place]; "Dr. [name]"   CARDIOVERSION N/A 05/27/2016   Procedure: CARDIOVERSION;  Surgeon: [name], MD;  Location: [facility];  Service: Cardiovascular;  Laterality: N/A;   CARDIOVERSION N/A 11/25/2016   Procedure: CARDIOVERSION;  Surgeon: [name], MD;  Location: [facility];  Service: Cardiovascular;  Laterality: N/A;   CARDIOVERSION N/A 09/18/2018   Procedure: CARDIOVERSION;  Surgeon: [name], MD;  Location: [facility];  Service: Cardiovascular;  Laterality: N/A;   CARDIOVERSION N/A 07/28/2019   Procedure: CARDIOVERSION;  Surgeon: Kate Sable, MD;  Location: [facility];  Service: Cardiovascular;  Laterality: N/A;   CARDIOVERSION N/A 09/07/2019   Procedure: CARDIOVERSION;  Surgeon: Nelva Bush, MD;  Location: [facility];  Service: Cardiovascular;  Laterality: N/A;   CARDIOVERSION N/A 02/19/2022   Procedure: CARDIOVERSION;  Surgeon: Nelva Bush, MD;  Location: [facility];  Service: Cardiovascular;  Laterality: N/A;   CATARACT EXTRACTION W/ INTRAOCULAR LENS  IMPLANT, BILATERAL Bilateral    COLONOSCOPY     ELECTROPHYSIOLOGIC STUDY N/A 01/24/2016   Procedure: CARDIOVERSION;  Surgeon: [name], MD;  Location: [facility];  Service: Cardiovascular;  Laterality: N/A;   implantable loop recorder implantation  10/18/2019   Medtronic Reveal [place] model [phone]) implantable loop recorder     LAPAROSCOPIC CHOLECYSTECTOMY     REDUCTION MAMMAPLASTY Bilateral    TONSILLECTOMY     TOTAL HIP ARTHROPLASTY Right 05/12/2018   Procedure: TOTAL HIP ARTHROPLASTY ANTERIOR APPROACH;  Surgeon: Paralee Cancel, MD;  Location: [facility];  Service: Orthopedics;  Laterality: Right;  70 mins    Current Medications: Current Meds  Medication Sig   apixaban (ELIQUIS) 5 MG TABS tablet TAKE ONE TABLET BY MOUTH TWICE DAILY   Ascorbic Acid (VITAMIN C PO) Take 1 tablet by mouth at bedtime.   calcium carbonate (OS-CAL - DOSED IN MG OF ELEMENTAL CALCIUM) 1250 (500 Ca) MG tablet Take 1 tablet by mouth daily with breakfast.   cefdinir (OMNICEF) 300 MG capsule Take 1 capsule (300 mg total) by mouth 2 (two) times daily for 5 days.   dapagliflozin propanediol (FARXIGA) 5 MG TABS tablet Take 1 tablet (5 mg total) by mouth daily before breakfast.   dofetilide (TIKOSYN) 250 MCG capsule TAKE 1 CAPSULE BY MOUTH TWO TIMES DAILY   hydroxypropyl methylcellulose / hypromellose (ISOPTO TEARS / GONIOVISC) 2.5 % ophthalmic solution Place 1 drop into both eyes 2 (two) times daily as needed for dry eyes.   magnesium oxide (MAG-OX) 400 MG tablet Take 1 tablet (400 mg total) by mouth 2 (two) times daily.   metFORMIN (GLUCOPHAGE-XR) 500 MG 24 hr tablet TAKE TWO TABLETS BY MOUTH TWICE DAILY WITH MEALS   metoprolol succinate (TOPROL-XL) 25 MG 24 hr tablet Take one tablet by mouth at bedtime   MYRBETRIQ 50 MG TB24 tablet Take 50 mg by mouth daily.   ONETOUCH ULTRA test strip CHECK BLOOD SUGAR 2 TIMES A DAY AS DIRECTED DX CODE E11.9   sertraline (ZOLOFT) 100 MG tablet Take 1 tablet (100 mg total) by mouth at bedtime.   zolpidem (AMBIEN) 10 MG tablet Take 10 mg by mouth at bedtime.     Allergies:   Cortisone, Pravachol [pravastatin sodium], and Contrast media [iodinated contrast media]   Social History   Socioeconomic History   Marital status: Married    Spouse name: Jimmy   Number of children: 2   Years of education: Not on file    Highest education level: Not on file  Occupational History   Occupation: retired  Tobacco Use   Smoking status: Never   Smokeless tobacco: Never   Tobacco comments:    Never smoke 08/30/21  Vaping Use   Vaping Use: Never used  Substance and Sexual Activity   Alcohol use: Yes    Comment: a little bit of wine at bedtime to help sleep   Drug use: No   Sexual activity: Yes  Other Topics Concern   Not on file  Social History Narrative   ** Merged History Encounter **       Exercise walks 3 times daily Lives with spouse in [place] - retired from employment 1 son and 1 daughter  Diet fruit, occ fast food, salads and lean meat  3 tea/day, no EtOH, no tobacco, no drugs    Social Determinants of Health   Financial Resource Strain: Low Risk  (08/31/2021)  Overall Financial Resource Strain (CARDIA)    Difficulty of Paying Living Expenses: Not hard at all  Food Insecurity: No Food Insecurity (08/31/2021)   Hunger Vital Sign    Worried About Running Out of Food in the Last Year: Never true    Ran Out of Food in the Last Year: Never true  Transportation Needs: No Transportation Needs (08/31/2021)   PRAPARE - Hydrologist (Medical): No    Lack of Transportation (Non-Medical): No  Physical Activity: Unknown (08/31/2021)   Exercise Vital Sign    Days of Exercise per Week: 3 days    Minutes of Exercise per Session: Not on file  Stress: No Stress Concern Present (08/31/2021)   [place]    Feeling of Stress : Only a little  Social Connections: Socially Integrated (08/31/2021)   Social Connection and Isolation Panel [NHANES]    Frequency of Communication with Friends and Family: More than three times a week    Frequency of Social Gatherings with Friends and Family: More than three times a week    Attends Religious Services: More than 4 times per year    Active Member of [employer] or  Organizations: Yes    Attends Music therapist: More than 4 times per year    Marital Status: Married     Family History: The patient's family history includes Cancer in her brother and mother; Dementia in her maternal aunt and mother; Diabetes in her brother and mother; Hyperlipidemia in her father and mother; Hypertension in her sister; Kidney cancer in her son; Lung cancer in her father; Pulmonary embolism in her mother; Stroke in her paternal grandfather and paternal grandmother. There is no history of Colon cancer or Esophageal cancer.  ROS:   Please see the history of present illness.    All other systems reviewed and are negative.  EKGs/Labs/Other Studies Reviewed:    The following studies were reviewed today:  EKG on December 19 shows atrial fibrillation EKG on December 18 shows regular, narrow complex tachycardia.  SVT versus a flutter.  Loop recorder interrogations recently have shown a 20% atrial fibrillation burden.    December 17, 2016 A-fib ablation by Dr. Rayann Heman.  Pulmonary vein isolation plus CTI ablation plus posterior box lesion set.  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  EKG:  The ekg ordered today demonstrates sinus rhythm. Intervals normal.    Recent Labs: 02/18/2022: ALT 15; Magnesium 2.1; TSH 3.291 02/19/2022: BUN 21; Creatinine, Ser 0.89; Hemoglobin 10.5; Platelets 336; Potassium 4.5; Sodium 138  Recent Lipid Panel    Component Value Date/Time   CHOL 147 10/24/2021 1129   CHOL 147 11/20/2016 0919   TRIG 79.0 10/24/2021 1129   HDL 57.70 10/24/2021 1129   HDL 47 11/20/2016 0919   CHOLHDL 3 10/24/2021 1129   VLDL 15.8 10/24/2021 1129   LDLCALC 73 10/24/2021 1129   LDLCALC 89 11/20/2016 0919   LDLDIRECT 163.1 07/04/2011 0922    Physical Exam:    VS:  BP 130/62 (BP Location: Left Arm, Patient Position: Sitting, Cuff Size: Normal)   Pulse 72   Ht '5\' 5"'$  (1.651 m)   Wt  182 lb 12.8 oz (82.9 kg)   LMP  (LMP Unknown)   SpO2 98%   BMI 30.42 kg/m     Wt Readings from Last 3 Encounters:  02/20/22 182 lb 12.8 oz (82.9  kg)  02/19/22 170 lb (77.1 kg)  12/19/21 178 lb 2 oz (80.8 kg)     GEN:  Well nourished, well developed in no acute distress HEENT: Normal NECK: No JVD; No carotid bruits LYMPHATICS: No lymphadenopathy CARDIAC: RRR, no murmurs, rubs, gallops RESPIRATORY:  Clear to auscultation without rales, wheezing or rhonchi  ABDOMEN: Soft, non-tender, non-distended MUSCULOSKELETAL:  No edema; No deformity  SKIN: Warm and dry NEUROLOGIC:  Alert and oriented x 3 PSYCHIATRIC:  Normal affect       ASSESSMENT:    1. Persistent atrial fibrillation (HCC)    PLAN:    In order of problems listed above:  #Persistent atrial fibrillation and atypical atrial flutter Symptomatic.  Had a prior A-fib ablation in 2018 with multiple flutter circuits noted.  On Tikosyn but continues to have symptomatic salvos of arrhythmia. On Eliquis for stroke prophylaxis.  I discussed treatment options including continuing Tikosyn, transitioning to amiodarone versus repeat catheter ablation.  She would like to pursue catheter ablation which I think is very reasonable.  I suspect she is having multiple atypical flutter circuits given her EKGs upon arrival to the ER and given her prior ablation records.  Discussed treatment options today for their AF including antiarrhythmic drug therapy and ablation. Discussed risks, recovery and likelihood of success. Discussed potential need for repeat ablation procedures and antiarrhythmic drugs after an initial ablation. They wish to proceed with scheduling.  Risk, benefits, and alternatives to EP study and radiofrequency ablation for afib were also discussed in detail today. These risks include but are not limited to stroke, bleeding, vascular damage, tamponade, perforation, damage to the esophagus, lungs, and other structures, pulmonary  vein stenosis, worsening renal function, and death. The patient understands these risk and wishes to proceed.  We will therefore proceed with catheter ablation at the next available time.  Carto, ICE, anesthesia are requested for the procedure.  Will also obtain CT PV protocol prior to the procedure to exclude LAA thrombus and further evaluate atrial anatomy.  #Sleep difficulties Recommended reducing caffeine intake. She drinks a significant amount of caffeinated beverages late into the day.     Medication Adjustments/Labs and Tests Ordered: Current medicines are reviewed at length with the patient today.  Concerns regarding medicines are outlined above.  Orders Placed This Encounter  Procedures   EKG 12-Lead   No orders of the defined types were placed in this encounter.    Signed, Hilton Cork. Quentin Ore, MD, [facility], [facility] 02/20/2022 7:00 PM    Electrophysiology [facility]

## 2022-02-20 NOTE — Assessment & Plan Note (Signed)
Body mass index is 28.29 kg/m. Complicates overall care and prognosis.  Recommend lifestyle modifications including physical activity and diet for weight loss and overall long-term health.

## 2022-02-20 NOTE — Patient Instructions (Addendum)
Medication Instructions:  - Your physician recommends that you continue on your current medications as directed. Please refer to the Current Medication list given to you today.  *If you need a refill on your cardiac medications before your next appointment, please call your pharmacy*   Lab Work: - pending procedure date  If you have labs (blood work) drawn today and your tests are completely normal, you will receive your results only by: [place] (if you have MyChart) OR A paper copy in the mail If you have any lab test that is abnormal or we need to change your treatment, we will call you to review the results.   Testing/Procedures: 1) Atrial Fibrillation ablation: - Your physician has recommended that you have an ablation. Catheter ablation is a medical procedure used to treat some cardiac arrhythmias (irregular heartbeats). During catheter ablation, a long, thin, flexible tube is put into a blood vessel in your groin (upper thigh), or neck. This tube is called an ablation catheter. It is then guided to your heart through the blood vessel. Radio frequency waves destroy small areas of heart tissue where abnormal heartbeats may cause an arrhythmia to start.   2) Cardiac CT- 1 week prior to your ablation - Your physician has requested that you have cardiac CT. Cardiac computed tomography (CT) is a painless test that uses an x-ray machine to take clear, detailed pictures of your heart.   Dr. Mardene Speak procedure scheduler, April Louretta Shorten, CMA will contact you with possible procedure dates and all of your instructions   Follow-Up: At [facility], you and your health needs are our priority.  As part of our continuing mission to provide you with exceptional heart care, we have created designated Provider Care Teams.  These Care Teams include your primary Cardiologist (physician) and Advanced Practice Providers (APPs -  Physician Assistants and Nurse Practitioners) who all work  together to provide you with the care you need, when you need it.  We recommend signing up for the patient portal called "MyChart".  Sign up information is provided on this After Visit Summary.  MyChart is used to connect with patients for Virtual Visits (Telemedicine).  Patients are able to view lab/test results, encounter notes, upcoming appointments, etc.  Non-urgent messages can be sent to your provider as well.   To learn more about what you can do with MyChart, go to [email or web address].    Your next appointment:   1) 4 weeks post procedure with the At[facility] [place]   2) 3 months post procedure with Dr. Quentin Ore  The format for your next appointment:   In Person  Provider:   As above     Other Instructions  Cardiac Ablation Cardiac ablation is a procedure to destroy (ablate) heart tissue that is sending bad signals. These bad signals cause the heart to beat very fast or in a way that is not normal. Destroying some tissues can help make the heart rhythm normal. Tell your doctor about: Any allergies you have. All medicines you are taking. These include vitamins, herbs, eye drops, creams, and over-the-counter medicines. Any problems you or family members have had with anesthesia. Any bleeding problems you have. Any surgeries you have had. Any medical conditions you have. Whether you are pregnant or may be pregnant. What are the risks? Your doctor will talk with you about risks. These may include: Infection. Bruising and bleeding. Stroke or blood clots. Damage to nearby areas of your body. Allergies to medicines or dyes. Needing  a pacemaker if the heart gets damaged. A pacemaker helps the heart beat normally. The procedure not working. What happens before the procedure? Medicines Ask your doctor about changing or stopping: Your normal medicines. Vitamins, herbs, and supplements. Over-the-counter medicines. Do not take aspirin or ibuprofen unless  you are told to. General instructions Follow instructions from your doctor about what you may eat and drink. If you will be going home right after the procedure, plan to have a responsible adult: Take you home from the hospital or clinic. You will not be allowed to drive. Care for you for the time you are told. Ask your doctor what steps will be taken to prevent the spread of germs. What happens during the procedure?  An IV tube will be put into one of your veins. You may be given: A sedative. This helps you relax. Anesthesia. This will: Numb certain areas of your body. The skin on your neck or groin will be numbed. A cut (incision) will be made in your neck or groin. A needle will be put through the cut and into a large vein. The small, thin tube (catheter) will be put into the needle. The tube will be moved to your heart. A type of X-ray (fluoroscopy) will be used to help guide the tube. It will also show constant images of the heart on a screen. Dye may be put through the tube. This helps your doctor see your heart. An electric current will be sent from the tube to destroy heart tissue in certain areas. The tube will be taken out. Pressure will be held on your cut. This helps stop bleeding. A bandage (dressing) will be put over your cut. The procedure may vary among doctors and hospitals. What happens after the procedure? You will be monitored until you leave the hospital or clinic. This includes checking your blood pressure, heart rate and rhythm, breathing rate, and blood oxygen level. Your cut will be checked for bleeding. You will need to lie still for a few hours. If your groin was used, you will need to keep your leg straight for a few hours after the small, thin tube is removed. This information is not intended to replace advice given to you by your health care provider. Make sure you discuss any questions you have with your health care provider. Document Revised: 08/07/2021  Document Reviewed: 08/07/2021 Elsevier Patient Education  [place].   Cardiac CT Angiogram A cardiac CT angiogram is a procedure to look at the heart and the area around the heart. It may be done to help find the cause of chest pains or other symptoms of heart disease. During this procedure, a substance called contrast dye is injected into the blood vessels in the area to be checked. A large X-ray machine, called a CT scanner, then takes detailed pictures of the heart and the surrounding area. The procedure is also sometimes called a coronary CT angiogram, coronary artery scanning, or CTA. A cardiac CT angiogram allows the health care provider to see how well blood is flowing to and from the heart. The health care provider will be able to see if there are any problems, such as: Blockage or narrowing of the coronary arteries in the heart. Fluid around the heart. Signs of weakness or disease in the muscles, valves, and tissues of the heart. Tell a health care provider about: Any allergies you have. This is especially important if you have had a previous allergic reaction to contrast dye. All  medicines you are taking, including vitamins, herbs, eye drops, creams, and over-the-counter medicines. Any blood disorders you have. Any surgeries you have had. Any medical conditions you have. Whether you are pregnant or may be pregnant. Any anxiety disorders, chronic pain, or other conditions you have that may increase your stress or prevent you from lying still. What are the risks? Generally, this is a safe procedure. However, problems may occur, including: Bleeding. Infection. Allergic reactions to medicines or dyes. Damage to other structures or organs. Kidney damage from the contrast dye that is used. Increased risk of cancer from radiation exposure. This risk is low. Talk with your health care provider about: The risks and benefits of testing. How you can receive the lowest dose of  radiation. What happens before the procedure? Wear comfortable clothing and remove any jewelry, glasses, dentures, and hearing aids. Follow instructions from your health care provider about eating and drinking. This may include: For 12 hours before the procedure -- avoid caffeine. This includes tea, coffee, soda, energy drinks, and diet pills. Drink plenty of water or other fluids that do not have caffeine in them. Being well hydrated can prevent complications. For 4-6 hours before the procedure -- stop eating and drinking. The contrast dye can cause nausea, but this is less likely if your stomach is empty. Ask your health care provider about changing or stopping your regular medicines. This is especially important if you are taking diabetes medicines, blood thinners, or medicines to treat problems with erections (erectile dysfunction). What happens during the procedure?  Hair on your chest may need to be removed so that small sticky patches called electrodes can be placed on your chest. These will transmit information that helps to monitor your heart during the procedure. An IV will be inserted into one of your veins. You might be given a medicine to control your heart rate during the procedure. This will help to ensure that good images are obtained. You will be asked to lie on an exam table. This table will slide in and out of the CT machine during the procedure. Contrast dye will be injected into the IV. You might feel warm, or you may get a metallic taste in your mouth. You will be given a medicine called nitroglycerin. This will relax or dilate the arteries in your heart. The table that you are lying on will move into the CT machine tunnel for the scan. The person running the machine will give you instructions while the scans are being done. You may be asked to: Keep your arms above your head. Hold your breath. Stay very still, even if the table is moving. When the scanning is complete, you  will be moved out of the machine. The IV will be removed. The procedure may vary among health care providers and hospitals. What can I expect after the procedure? After your procedure, it is common to have: A metallic taste in your mouth from the contrast dye. A feeling of warmth. A headache from the nitroglycerin. Follow these instructions at home: Take over-the-counter and prescription medicines only as told by your health care provider. If you are told, drink enough fluid to keep your urine pale yellow. This will help to flush the contrast dye out of your body. Most people can return to their normal activities right after the procedure. Ask your health care provider what activities are safe for you. It is up to you to get the results of your procedure. Ask your health care provider, or  the department that is doing the procedure, when your results will be ready. Keep all follow-up visits as told by your health care provider. This is important. Contact a health care provider if: You have any symptoms of allergy to the contrast dye. These include: Shortness of breath. Rash or hives. A racing heartbeat. Summary A cardiac CT angiogram is a procedure to look at the heart and the area around the heart. It may be done to help find the cause of chest pains or other symptoms of heart disease. During this procedure, a large X-ray machine, called a CT scanner, takes detailed pictures of the heart and the surrounding area after a contrast dye has been injected into blood vessels in the area. Ask your health care provider about changing or stopping your regular medicines before the procedure. This is especially important if you are taking diabetes medicines, blood thinners, or medicines to treat erectile dysfunction. If you are told, drink enough fluid to keep your urine pale yellow. This will help to flush the contrast dye out of your body. This information is not intended to replace advice given to  you by your health care provider. Make sure you discuss any questions you have with your health care provider. Document Revised: 06/07/2021 Document Reviewed: 10/14/2018 Elsevier Patient Education  [place]

## 2022-02-21 ENCOUNTER — Ambulatory Visit: Payer: Self-pay

## 2022-02-21 NOTE — Patient Outreach (Signed)
  Care Coordination   Follow Up Visit Note   02/21/2022 Name: [name] MRN: [id number] DOB: 13-Dec-1945  [name] is a 76 y.o. year old female who sees Jinny Sanders, MD for primary care. I spoke with  [name] by phone today.  What matters to the patients health and wellness today?  Ongoing management of health conditions.    Goals Addressed             This Visit's Progress    Patient Stated:  Managing Health conditions       Care Coordination Interventions: Reviewed medication changes and discussed importance of compliance Advised to notify provider of any new / ongoing symptoms Reviewed scheduled/ upcoming appointments Evaluation of current treatment plan related to dizziness and patient's adherence to plan as established by provider:  Patient reports having hospital stay on 02/19/22 due to atrial fibrillation. She reports having cardioversion on 02/19/22. She states she had appointment with cardiologist on 02/20/22 and decision was made to have ablation. She states ablation to be tentatively scheduled for April 2024 unless cardiologist has cancellation.  Discussed plans with patient for ongoing care management follow up and provided patient with direct contact information for care management team.  Patient verbally agreed to next telephone outreach with [facility] scheduled for               SDOH assessments and interventions completed:  No     Care Coordination Interventions:  Yes, provided   Follow up plan: Follow up call scheduled for 05/01/22    Encounter Outcome:  Pt. Visit Completed   Quinn Plowman RN,BSN,CCM [place] [phone] direct line

## 2022-02-22 ENCOUNTER — Telehealth: Payer: Self-pay

## 2022-02-22 NOTE — Progress Notes (Addendum)
Chronic Care Management Pharmacy Assistant   Name: SOPHI CALLIGAN  MRN: [id number] DOB: 17-Oct-1945  Reason for Encounter: Non-CCM [facility] Follow-Up)  Medications: Outpatient Encounter Medications as of 02/22/2022  Medication Sig   apixaban (ELIQUIS) 5 MG TABS tablet TAKE ONE TABLET BY MOUTH TWICE DAILY   Ascorbic Acid (VITAMIN C PO) Take 1 tablet by mouth at bedtime.   atorvastatin (LIPITOR) 10 MG tablet TAKE ONE TABLET BY MOUTH AT BEDTIME (Patient not taking: Reported on 02/20/2022)   calcium carbonate (OS-CAL - DOSED IN MG OF ELEMENTAL CALCIUM) 1250 (500 Ca) MG tablet Take 1 tablet by mouth daily with breakfast.   cefdinir (OMNICEF) 300 MG capsule Take 1 capsule (300 mg total) by mouth 2 (two) times daily for 5 days.   dapagliflozin propanediol (FARXIGA) 5 MG TABS tablet Take 1 tablet (5 mg total) by mouth daily before breakfast.   dofetilide (TIKOSYN) 250 MCG capsule TAKE 1 CAPSULE BY MOUTH TWO TIMES DAILY   doxylamine, Sleep, (UNISOM) 25 MG tablet Take 25 mg by mouth at bedtime as needed. (Patient not taking: Reported on 02/20/2022)   hydroxypropyl methylcellulose / hypromellose (ISOPTO TEARS / GONIOVISC) 2.5 % ophthalmic solution Place 1 drop into both eyes 2 (two) times daily as needed for dry eyes.   linagliptin (TRADJENTA) 5 MG TABS tablet TAKE 1 TABLET BY MOUTH DAILY (Patient not taking: Reported on 02/20/2022)   magnesium oxide (MAG-OX) 400 MG tablet Take 1 tablet (400 mg total) by mouth 2 (two) times daily.   metFORMIN (GLUCOPHAGE-XR) 500 MG 24 hr tablet TAKE TWO TABLETS BY MOUTH TWICE DAILY WITH MEALS   metoprolol succinate (TOPROL-XL) 25 MG 24 hr tablet Take one tablet by mouth at bedtime   MYRBETRIQ 50 MG TB24 tablet Take 50 mg by mouth daily.   ONETOUCH ULTRA test strip CHECK BLOOD SUGAR 2 TIMES A DAY AS DIRECTED DX CODE E11.9   sertraline (ZOLOFT) 100 MG tablet Take 1 tablet (100 mg total) by mouth at bedtime.   zolpidem (AMBIEN) 10 MG tablet Take 10 mg by mouth at  bedtime.   No facility-administered encounter medications on file as of 02/22/2022.   Reviewed hospital notes for details of recent visit. Has patient been contacted by Transitions of Care team? Yes Has patient seen PCP/specialist for hospital follow up (summarize OV if yes): No  Admitted to the hospital on 02/18/2022. Discharge date was 02/19/2022.  Discharged from [facility].   Discharge diagnosis (Principal Problem): Atrial fibrillation with rapid ventricular response  Patient was discharged to Home  Brief summary of hospital course: "KADESIA ROBEL is a 76 y.o. female with medical history significant for paroxysmal atrial fibrillation on Eliquis, diverticulosis, GERD, hypertension, hypothyroidism, OSA on CPAP, type 2 diabetes mellitus, who presented to the emergency room with a [place] of palpitations with a feeling of fast heart beating and irregularity with mild chest discomfort.  She denied any associated nausea or vomiting or diaphoresis.  No dyspnea or cough or wheezing or hemoptysis.  No leg pain or edema or recent travels or surgeries.  No paresthesias or focal muscle weakness.  No dysuria, oliguria or hematuria or flank pain.  No bleeding diathesis.   ED Course: When she came to the ER, heart rate was up to 175 with BP 122/96 and later 143/83 and respiratory rate of 24.  CMP was remarkable for blood glucose of 220 and creatinine 1.19 and high sensitive troponin was 11.  CBC showed hemoglobin 11.7 hematocrit 37.7. EKG as reviewed  by me : Initial EKG showed supraventricular tachycardia with rate of 174 with ST segment depression in anterolateral and inferior leads. Imaging: Portable chest ray showed minimal patchy opacities in the left lung base likely atelectasis.   The patient was given 5 mg of IV Lopressor X 3 and 50 mg of p.o. Lopressor as well as 1 L bolus of IV normal saline.  She will be admitted to a progressive unit bed for further evaluation and management."    Cardiology reviewed initial EKG, felt more consistent with SVT vs atypical A-flutter.    Assessment and Plan: * Atrial fibrillation with rapid ventricular response [facility]) Cardiology consulted.  Patient underwent cardioversion successfully this AM.  Symptoms improved. Continue Lopressor, Tikosyn, Eliquis Patient has follow up with Dr. Quentin Ore tomorrow.    Type 2 diabetes mellitus without complications (HCC) Resume metformin at d/c   Depression Zoloft   Dyslipidemia statin    Recurrent UTI's - pt had completed course of Keflex a couple days ago for UTI, but reports ongoing symptoms of urgency / frequency, not improved.  Outpatient culture reviewed, Klebsiella should have been sensitive to Keflex.   UA here some signs infection, sent for culture - pending. Omnicef prescribed. Recommend urology follow up to evaluate underlying etiology of recurrent UTI's and/or other cause of urgency / frequency.   New?Medications Started at [facility] Discharge:?? -Started cefdinir (OMNICEF) 300 MG capsule    Medication Changes at Hospital Discharge: None noted  Medications Discontinued at Hospital Discharge: -Stopped cephALEXin 500 MG capsule   Medications that remain the same after Hospital Discharge:??  -All other medications will remain the same.    Next CCM appt: Non-CCM  Other upcoming appts: Cardiology appointment on 02/28/2022 Dr. Lorelei Pont appointment on 02/28/2022 Hospital F/U PCP appointment on 03/05/2022  Charlene Brooke, PharmD notified and will determine if action is needed.  Marijean Niemann, [place] Pharmacy Assistant [phone]   Pharmacist addendum: Reviewed chart. Patient may be a good candidate for Pharmacist services. Will reach out to discuss in new year.  Charlene Brooke, PharmD, BCACP 03/01/22 12:15 PM

## 2022-02-22 NOTE — Telephone Encounter (Signed)
Alert received from [employer] d/t electrical reset of Pt's loop recorder.  Per [employer] rep, Pt must be seen in clinic to reset settings.  Will see if [employer] rep can be present at upcoming appt.

## 2022-02-26 DIAGNOSIS — N139 Obstructive and reflux uropathy, unspecified: Secondary | ICD-10-CM | POA: Diagnosis not present

## 2022-02-26 DIAGNOSIS — N3000 Acute cystitis without hematuria: Secondary | ICD-10-CM | POA: Diagnosis not present

## 2022-02-26 NOTE — Progress Notes (Unsigned)
Cardiology Office Note    Date:  02/28/2022   ID:  [name], DOB 1945-09-01, MRN [id number]  PCP:  Jinny Sanders, MD  Cardiologist:  Kathlyn Sacramento, MD  Electrophysiologist:  Virl Axe, MD   Chief Complaint: Follow up  History of Present Illness:   [name] is a 77 y.o. female with history of symptomatic persistent A. fib on Eliquis status post cardioversion in 01/2016, 05/2016, and 11/2016 status post radiofrequency catheter ablation on 12/17/2016 s/p repeat DCCV 09/07/2019, and again in 02/2022 followed by EP/A. fib clinic on Eliquis and Tikosyn, atrial tachycardia s/p DCCV 09/18/2018, pulmonary hypertension, mitral valve prolapse with mitral regurgitation, DM2, hypertension, hyperlipidemia, hypothyroidism, anemia, obesity, OSA on CPAP, diverticulosis, GERD, and anxiety who presents for follow-up of A-fib.   Prior cardiac cath in 12/2008 for atypical chest pain showed no significant CAD with mildly elevated LVEDP and normal EF.  She was initially diagnosed with A. fib in 09/2013 and was quite symptomatic with this.  Echo at that time showed normal LV systolic function with mild mitral regurgitation, moderately dilated left atrium, and mild to moderate pulmonary hypertension.  Given it was felt she would likely need antiarrhythmic therapy due to symptoms, nuclear stress test in 09/2013 was undertaken and was normal.  With recurrence of A. fib, she was subsequently placed on flecainide and underwent cardioversion in 01/2016.  She had recurrent A. fib in 05/2016 and underwent repeat successful cardioversion at that time.  She was referred to the A. fib clinic.  She had recurrent A. fib in 11/2016 and underwent a third cardioversion.  She then underwent radiofrequency catheter ablation on 12/17/2016.  Coronary CTA/morphology at that time showed a calcium score of 0.  She was seen on 09/15/2018 noting a 4 to 5-day history of mild palpitations that felt different than her prior episodes of A. fib  along with some brief episodes of dizziness.  After review with EP, she was noted to be in atrial tachycardia with long cycle Mobitz type I AV block. Her flecainide was increased to 50 mg bid and she underwent successful DCCV on 09/18/2018.  In follow up with the Afib clinic in 04/2019, she was maintaining sinus rhythm.  Echo in 04/2019 showed an EF of 55 to 60%, mildly dilated left ventricular cavity, no regional wall motion abnormalities, normal RV systolic function and ventricular cavity size, severely dilated left atrium measuring 55 mm, mild mitral regurgitation, mild aortic valve sclerosis without evidence of stenosis.  She had recurrent Afib in 07/2019, and was scheduled for DCCV.  She converted to sinus rhythm prior to this being completed.  At that time, her flecainide was titrated to 100 mg bid.  She was seen by Dr. Fletcher Anon on 09/02/2019, and was back in symptomatic Afib with noted dizziness, fatigue, and a syncopal episode.  Her diltiazem was changed to metoprolol and she subsequently underwent successful DCCV on 09/07/2019 to sinus bradycardia.  Her moderate mitral regurgitation was not felt to be driving her Afib.  With her severely dilated left atrium, it was felt it may be difficult to keep her in sinus rhythm.  She was re-referred to EP in this setting.  She was seen in follow up on 09/17/2019 and was doing well, and maintaining sinus rhythm. She was mildly bradycardic with a heart rate of 54 bpm, though was asymptomatic.  BP was 140/60.  She was seen in the office on 10/01/2019 for evaluation of dizziness with possible transient syncope which led to  the tapering of her flecainide to 50 mg bid with improvement in symptoms.  She was seen in follow up by Dr. Rayann Heman on 10/18/2019, and was doing well.  With regards to her Afib burden and symptoms, ILR was implanted.  She was seen in this office in 12/2019 by Dr. Fletcher Anon and was doing well, without persistent Afib episodes noted on ILR.  She was seen by Dr. Rayann Heman in  01/2020 with a < 1% Afib burden noted on ILR, with many episodes felt to appear to be sinus with PACs rather than actual Afib.  Repeat echo in 04/2020, to trend her mitral regurgitation, showed an EF of 55-60%, no RWMA, mildly dilated LV cavity size, normal RVSF and ventricular cavity size, PASP 49 mmHg, severely dilated left atrium, moderate mitral regurgitation, mild aortic sclerosis without stenosis, and a right atrial pressure of 3 mmHg.  Echo in 04/2020 demonstrated an EF of 55 to 60%, no regional wall motion abnormalities, indeterminate LV diastolic function parameters, normal RV systolic function and ventricular cavity size, PASP 49 mmHg, severely dilated left atrium, moderate mitral regurgitation, mild aortic valve sclerosis without evidence of stenosis, and an estimated right atrial pressure of 3 mmHg. She is followed by EP and the A-fib clinic.  ILR interrogations have demonstrated intermittent A-fib burden.  She underwent outpatient cardiac monitoring in 03/2021 which showed a predominant underlying rhythm of atrial fibrillation with an overall burden of 56%, 3 episodes of SVT with the longest and fastest interval lasting 14.1 seconds, though there was difficulty discerning atrial activity making definitive diagnosis difficult to ascertain.  Most recent ILR interrogation showed a 7.8% burden of A-fib and 270 AT episodes.   She was seen in the office on 11/07/2021 noting a 2 to 3-week history of intermittent chest discomfort that was more noticeable at nighttime when laying down.  She was without exertional symptoms.  There was some associated shortness of breath and dizziness.  She continued to note palpitations.  She also noted a sense of fatigue which she correlated with initiating dofetilide.  She underwent Lexiscan MPI on 11/16/2021 showed no significant ischemia with an EF of 75%, and was overall low risk.  CT attenuated corrected images showed no significant coronary calcification with minimal aortic  atherosclerosis.  In follow-up in 11/2021, she continued to note significant fatigue and insomnia with intermittent palpitations.  She was interested in transitioning off Tikosyn.  She was evaluated by Dr. Caryl Comes later in 11/2021 with subsequent Zio patch demonstrating a predominant rhythm of sinus with an A-fib burden of 1%, and frequent PACs with an overall 6.7% burden.  She was referred to  Dr. Quentin Ore for discussion of repeat catheter ablation.  Echo in 01/2022 demonstrated an EF of 55 to 60%, no regional wall motion abnormalities, grade 2 diastolic dysfunction, normal RV systolic function and ventricular cavity size, normal PASP, severely dilated left atrium measuring 5 cm, mild mitral regurgitation, and an estimated right atrial pressure of 3 mmHg.  She was admitted to the hospital on 02/19/2022 with A-fib with RVR in the setting of UTI.  High-sensitivity troponin peaked at 18.  She underwent successful DCCV during the admission.  She was evaluated by Dr. Quentin Ore on 02/20/2022 with plans for repeat catheter ablation.  Most recent ILR interrogation from 02/2022 showed 17 tachycardic events that appear to be a combination of sinus tachycardia and likely A-fib with RVR, 511 atrial tach episodes, and 241 episodes of A-fib with an overall burden of 21.1%.  She comes in doing  reasonably well from a cardiac perspective and is without symptoms of angina or decompensation.  She continues to note largely unchanged fatigue.  Sleep has improved some following the addition of zolpidem.  She did have some fleeting chest discomfort last week that has resolved.  No frank syncope.  No falls or symptoms concerning for bleeding.  She remains adherent to Tikosyn, apixaban, and Toprol-XL.  She continues to want to pursue A-fib ablation.   Labs independently reviewed: 02/2022 - Hgb 11.5, PLT 336, potassium 4.5, BUN 21, serum creatinine 0.89, TSH normal, free T4 normal, magnesium 2.1, albumin 4.1, AST/ALT normal 11/2021 -  A1c 6.9 10/2021 - TC 147, TG 79, HDL 57, LDL 73  Past Medical History:  Diagnosis Date   Anemia    Anxiety    Atrial fibrillation (HCC)    Colon polyps    Constipation    Depressive disorder, not elsewhere classified    Diabetes mellitus without complication ([place])    Diaphragmatic hernia without mention of obstruction or gangrene    Disorders of bursae and tendons in shoulder region, unspecified    Diverticulosis of colon (without mention of hemorrhage)    External hemorrhoid    Fundic gland polyps of stomach, benign    GERD (gastroesophageal reflux disease)    History of hiatal hernia    HTN (hypertension)    Hypertension    Hypothyroidism    Internal hemorrhoids without mention of complication    Moderate mitral regurgitation    Obesity, unspecified    OSA on CPAP    compliant with CPAP   Other urinary incontinence    Persistent atrial fibrillation ([place])    Pure hypercholesterolemia    Type II diabetes mellitus ([place])    Urinary frequency    Urinary urgency    Vitamin B 12 deficiency     Past Surgical History:  Procedure Laterality Date   ABDOMINAL HYSTERECTOMY     ABDOMINAL HYSTERECTOMY  1972   Have both ovaries   ATRIAL FIBRILLATION ABLATION  12/17/2016   ATRIAL FIBRILLATION ABLATION N/A 12/17/2016   Procedure: ATRIAL FIBRILLATION ABLATION;  Surgeon: Thompson Grayer, MD;  Location: [facility];  Service: Cardiovascular;  Laterality: N/A;   [place]   [facility]; "Dr. [name]"   CARDIOVERSION N/A 05/27/2016   Procedure: CARDIOVERSION;  Surgeon: [name], MD;  Location: [facility];  Service: Cardiovascular;  Laterality: N/A;   CARDIOVERSION N/A 11/25/2016   Procedure: CARDIOVERSION;  Surgeon: [name], MD;  Location: [facility];  Service: Cardiovascular;  Laterality: N/A;   CARDIOVERSION N/A 09/18/2018   Procedure: CARDIOVERSION;  Surgeon: [name], MD;  Location: [facility];  Service: Cardiovascular;  Laterality: N/A;    CARDIOVERSION N/A 07/28/2019   Procedure: CARDIOVERSION;  Surgeon: Kate Sable, MD;  Location: [facility];  Service: Cardiovascular;  Laterality: N/A;   CARDIOVERSION N/A 09/07/2019   Procedure: CARDIOVERSION;  Surgeon: Nelva Bush, MD;  Location: [facility];  Service: Cardiovascular;  Laterality: N/A;   CARDIOVERSION N/A 02/19/2022   Procedure: CARDIOVERSION;  Surgeon: Nelva Bush, MD;  Location: [facility];  Service: Cardiovascular;  Laterality: N/A;   CATARACT EXTRACTION W/ INTRAOCULAR LENS  IMPLANT, BILATERAL Bilateral    COLONOSCOPY     ELECTROPHYSIOLOGIC STUDY N/A 01/24/2016   Procedure: CARDIOVERSION;  Surgeon: [name], MD;  Location: [facility];  Service: Cardiovascular;  Laterality: N/A;   implantable loop recorder implantation  10/18/2019   [employer] Reveal [place] model [id number] ([id number]) implantable loop recorder  LAPAROSCOPIC CHOLECYSTECTOMY     REDUCTION MAMMAPLASTY Bilateral    TONSILLECTOMY     TOTAL HIP ARTHROPLASTY Right 05/12/2018   Procedure: TOTAL HIP ARTHROPLASTY ANTERIOR APPROACH;  Surgeon: Paralee Cancel, MD;  Location: [facility];  Service: Orthopedics;  Laterality: Right;  70 mins    Current Medications: Current Meds  Medication Sig   apixaban (ELIQUIS) 5 MG TABS tablet TAKE ONE TABLET BY MOUTH TWICE DAILY   Ascorbic Acid (VITAMIN C PO) Take 1 tablet by mouth at bedtime.   calcium carbonate (OS-CAL - DOSED IN MG OF ELEMENTAL CALCIUM) 1250 (500 Ca) MG tablet Take 1 tablet by mouth daily with breakfast.   dapagliflozin propanediol (FARXIGA) 5 MG TABS tablet Take 1 tablet (5 mg total) by mouth daily before breakfast.   dofetilide (TIKOSYN) 250 MCG capsule TAKE 1 CAPSULE BY MOUTH TWO TIMES DAILY   hydroxypropyl methylcellulose / hypromellose (ISOPTO TEARS / GONIOVISC) 2.5 % ophthalmic solution Place 1 drop into both eyes 2 (two) times daily as needed for dry eyes.   magnesium oxide (MAG-OX) 400 MG tablet Take 1 tablet (400 mg total) by mouth 2 (two) times  daily.   metFORMIN (GLUCOPHAGE-XR) 500 MG 24 hr tablet TAKE TWO TABLETS BY MOUTH TWICE DAILY WITH MEALS   metoprolol succinate (TOPROL-XL) 25 MG 24 hr tablet Take one tablet by mouth at bedtime   MYRBETRIQ 50 MG TB24 tablet Take 50 mg by mouth daily.   ONETOUCH ULTRA test strip CHECK BLOOD SUGAR 2 TIMES A DAY AS DIRECTED DX CODE E11.9   sertraline (ZOLOFT) 100 MG tablet Take 1 tablet (100 mg total) by mouth at bedtime.   zolpidem (AMBIEN) 10 MG tablet Take 10 mg by mouth at bedtime.    Allergies:   Cortisone, Pravachol [pravastatin sodium], and Contrast media [iodinated contrast media]   Social History   Socioeconomic History   Marital status: Married    Spouse name: Jimmy   Number of children: 2   Years of education: Not on file   Highest education level: Not on file  Occupational History   Occupation: retired  Tobacco Use   Smoking status: Never   Smokeless tobacco: Never   Tobacco comments:    Never smoke 08/30/21  Vaping Use   Vaping Use: Never used  Substance and Sexual Activity   Alcohol use: Not Currently    Comment: a little bit of wine at bedtime to help sleep   Drug use: No   Sexual activity: Yes  Other Topics Concern   Not on file  Social History Narrative   ** Merged History Encounter **       Exercise walks 3 times daily Lives with spouse in [place] - retired from employment 1 son and 1 daughter  Diet fruit, occ fast food, salads and lean meat  3 tea/day, no EtOH, no tobacco, no drugs    Social Determinants of Health   Financial Resource Strain: Low Risk  (08/31/2021)   Overall Financial Resource Strain (CARDIA)    Difficulty of Paying Living Expenses: Not hard at all  Food Insecurity: No Food Insecurity (08/31/2021)   Hunger Vital Sign    Worried About Running Out of Food in the Last Year: Never true    [place] in the Last Year: Never true  Transportation Needs: No Transportation Needs (08/31/2021)   PRAPARE - Armed forces logistics/support/administrative officer (Medical): No    Lack of Transportation (Non-Medical): No  Physical Activity: Unknown (08/31/2021)  Exercise Vital Sign    Days of Exercise per Week: 3 days    Minutes of Exercise per Session: Not on file  Stress: No Stress Concern Present (08/31/2021)   [place]    Feeling of Stress : Only a little  Social Connections: Socially Integrated (08/31/2021)   Social Connection and Isolation Panel [NHANES]    Frequency of Communication with Friends and Family: More than three times a week    Frequency of Social Gatherings with Friends and Family: More than three times a week    Attends Religious Services: More than 4 times per year    Active Member of [employer] or Organizations: Yes    Attends Music therapist: More than 4 times per year    Marital Status: Married     Family History:  The patient's family history includes Cancer in her brother and mother; Dementia in her maternal aunt and mother; Diabetes in her brother and mother; Hyperlipidemia in her father and mother; Hypertension in her sister; Kidney cancer in her son; Lung cancer in her father; Pulmonary embolism in her mother; Stroke in her paternal grandfather and paternal grandmother. There is no history of Colon cancer or Esophageal cancer.  ROS:   12-point review of systems is negative unless otherwise noted in the HPI   EKGs/Labs/Other Studies Reviewed:    Studies reviewed were summarized above. The additional studies were reviewed today:  2D echo 04/15/2019: 1. Normal LV systolic function; mild LVE; mild eccentric MR; severe LAE.   2. Left ventricular ejection fraction, by estimation, is 55 to 60%. The  left ventricle has normal function. The left ventrical has no regional  wall motion abnormalities. The left ventricular internal cavity size was  mildly dilated. Left ventricular  diastolic parameters are indeterminate.   3.  Right ventricular systolic function is normal. The right ventricular  size is normal.   4. Left atrial size was severely dilated.   5. The mitral valve is normal in structure and function. Mild mitral  valve regurgitation. No evidence of mitral stenosis.   6. The aortic valve is tricuspid. Aortic valve regurgitation is not  visualized. Mild aortic valve sclerosis is present, with no evidence of  aortic valve stenosis.   7. The inferior vena cava is normal in size with greater than 50%  respiratory variability, suggesting right atrial pressure of 3 mmHg. __________   2D echo 04/25/2020: 1. Left ventricular ejection fraction, by estimation, is 55 to 60%. The  left ventricle has normal function. The left ventricle has no regional  wall motion abnormalities. The left ventricular internal cavity size was  mildly dilated. Left ventricular  diastolic parameters are indeterminate.   2. Right ventricular systolic function is normal. The right ventricular  size is normal. There is moderately elevated pulmonary artery systolic  pressure. The estimated right ventricular systolic pressure is 04.5 mmHg.   3. Left atrial size was severely dilated.   4. The mitral valve is normal in structure. Moderate mitral valve  regurgitation. No evidence of mitral stenosis.   5. The aortic valve is normal in structure. Aortic valve regurgitation is  not visualized. Mild aortic valve sclerosis is present, with no evidence  of aortic valve stenosis.   6. The inferior vena cava is normal in size with greater than 50%  respiratory variability, suggesting right atrial pressure of 3 mmHg.   Comparison(s): Prior Echo showed LV EF 55-60%, severe LAE with eccentric  MR.  __________   Elwyn Reach patch 03/2021: Patient had a min HR of 45 bpm, max HR of 166 bpm, and avg HR of 93 bpm. Predominant underlying rhythm was Atrial Fibrillation. First Degree AV Block was present. 3 Supraventricular Tachycardia runs occurred, the run with  the fastest interval lasting  14.1 secs with a max rate of 146 bpm (avg 126 bpm); the run with the fastest interval was also the longest. Atrial Fibrillation occurred (56% burden), ranging from 56-166 bpm (avg of 119 bpm), the longest lasting 6 days 13 hours with an avg rate of 120  bpm. Atrial Fibrillation was detected within +/- 45 seconds of symptomatic patient event(s). Isolated SVEs were rare (<1.0%), SVE Couplets were rare (<1.0%), and SVE Triplets were rare (<1.0%). Isolated VEs were rare (<1.0%), VE Couplets were rare  (<1.0%), and no VE Triplets were present. Difficulty discerning atrial activity making definitive diagnosis difficult to ascertain.   1. NSR with sinus tachy and sinus bradycardia 2. Periods of SVT up to 145/min at times transitioning to atrial fib vs SVT with block 3. No VT 4. Rare PVC's and occaisional PAC's __________   Carlton Adam MPI 11/16/2021: Pharmacological myocardial perfusion imaging study with no significant  ischemia GI uptake artifact noted Normal wall motion, EF estimated at 75% No EKG changes concerning for ischemia at peak stress or in recovery. CT attenuation correction images with no significant coronary calcification, minimal aortic atherosclerosis Low risk scan __________  Zio patch 11/2021: Patient had a min HR of 47 bpm, max HR of 190 bpm, and avg HR of 67 bpm. Predominant underlying rhythm was Sinus Rhythm. First Degree AV Block was present. Atrial Fibrillation occurred (1% burden), ranging from 96-190 bpm (avg of 148 bpm), the longest  lasting 2 hours 36 mins with an avg rate of 148 bpm. Isolated SVEs were frequent (6.7%, 47813), SVE Couplets were occasional (2.0%, 7287), and SVE Triplets were occasional (1.5%, 3673). Isolated VEs were rare (<1.0%, 167), VE Couplets were rare (<1.0%,  6), and VE Triplets were rare (<1.0%, 2).   Indication:PACs   Duration: 7d10h   Findings HR  avg 82  Min 47-Max 100  PVCs Rare, less than 1%   PACs >10% incl  couplets and triplets ATrial fibrillationNonsustained 1 episodes; fastest 190 bpm for2 hrs 36 min with avg HR 148   Symptoms:            LH>> atrial ectopy               Triggered:             none   Conclusions: Atrial fib known     Recommendations Continue current therapy  __________  2D echo 01/03/2022: 1. Left ventricular ejection fraction, by estimation, is 55 to 60%. The  left ventricle has normal function. The left ventricle has no regional  wall motion abnormalities. Left ventricular diastolic parameters are  consistent with Grade II diastolic  dysfunction (pseudonormalization).   2. Right ventricular systolic function is normal. The right ventricular  size is normal. There is normal pulmonary artery systolic pressure. The  estimated right ventricular systolic pressure is 74.9 mmHg.   3. Left atrial size was severely dilated.   4. The mitral valve is normal in structure. Mild mitral valve  regurgitation. No evidence of mitral stenosis.   5. The aortic valve is tricuspid. Aortic valve regurgitation is not  visualized. No aortic stenosis is present.   6. The inferior vena cava is normal in size with greater than  50%  respiratory variability, suggesting right atrial pressure of 3 mmHg.   Comparison(s): 2/22-EF 55-60%, MR.     EKG:  EKG is ordered today.  The EKG ordered today demonstrates sinus bradycardia with occasional PACs, 59 bpm, stable QT, no acute ST-T changes  Recent Labs: 02/18/2022: ALT 15; Magnesium 2.1; TSH 3.291 02/19/2022: BUN 21; Creatinine, Ser 0.89; Hemoglobin 10.5; Platelets 336; Potassium 4.5; Sodium 138  Recent Lipid Panel    Component Value Date/Time   CHOL 147 10/24/2021 1129   CHOL 147 11/20/2016 0919   TRIG 79.0 10/24/2021 1129   HDL 57.70 10/24/2021 1129   HDL 47 11/20/2016 0919   CHOLHDL 3 10/24/2021 1129   VLDL 15.8 10/24/2021 1129   LDLCALC 73 10/24/2021 1129   LDLCALC 89 11/20/2016 0919   LDLDIRECT 163.1 07/04/2011 0922     PHYSICAL EXAM:    VS:  BP 130/70 (BP Location: Left Arm, Patient Position: Sitting, Cuff Size: Normal)   Pulse (!) 59   Ht '5\' 5"'$  (1.651 m)   Wt 180 lb 9.6 oz (81.9 kg)   LMP  (LMP Unknown)   SpO2 97%   BMI 30.05 kg/m   BMI: Body mass index is 30.05 kg/m.  Physical Exam Vitals reviewed.  Constitutional:      Appearance: She is well-developed.  HENT:     Head: Normocephalic and atraumatic.  Eyes:     General:        Right eye: No discharge.        Left eye: No discharge.  Neck:     Vascular: No JVD.  Cardiovascular:     Rate and Rhythm: Regular rhythm. Bradycardia present. Occasional Extrasystoles are present.    Pulses:          Posterior tibial pulses are 2+ on the right side and 2+ on the left side.     Heart sounds: S1 normal and S2 normal. Heart sounds not distant. No midsystolic click and no opening snap. Murmur heard.     Systolic murmur is present with a grade of 1/6 at the lower left sternal border.     No friction rub.  Pulmonary:     Effort: Pulmonary effort is normal. No respiratory distress.     Breath sounds: Normal breath sounds. No decreased breath sounds, wheezing or rales.  Chest:     Chest wall: No tenderness.  Abdominal:     General: There is no distension.  Musculoskeletal:     Cervical back: Normal range of motion.     Right lower leg: No edema.     Left lower leg: No edema.  Skin:    General: Skin is warm and dry.     Nails: There is no clubbing.  Neurological:     Mental Status: She is alert and oriented to person, place, and time.  Psychiatric:        Speech: Speech normal.        Behavior: Behavior normal.        Thought Content: Thought content normal.        Judgment: Judgment normal.     Wt Readings from Last 3 Encounters:  02/28/22 180 lb 9.6 oz (81.9 kg)  02/20/22 182 lb 12.8 oz (82.9 kg)  02/19/22 170 lb (77.1 kg)     ASSESSMENT & PLAN:   Persistent A-fib: Maintaining sinus rhythm with a mildly bradycardic rate with  occasional PACs.  She remains on Tikosyn with potassium and magnesium at goal along with low-dose Toprol-XL.  QT stable.  EP is planning for an A-fib ablation in 06/2022.  CHA2DS2-VASc at least 4 (HTN, age x 1, DM2, sex category).  She remains on apixaban 5 mg twice daily.  The importance of anticoagulation was discussed.  Follow-up with EP as directed.  Paroxysmal SVT/atrial tachycardia: Maintaining sinus rhythm on Toprol-XL.  Follow-up with EP.  Mitral regurgitation: Improved on most recent echo last month.  Monitor periodically.  HTN: Blood pressure is reasonably controlled in the office today.  She remains on Toprol-XL.  HLD: LDL 73 in 10/2021.  Not currently on statin.    Disposition: F/u with Dr. Fletcher Anon or an APP in 6 months, and EP as directed.   Medication Adjustments/Labs and Tests Ordered: Current medicines are reviewed at length with the patient today.  Concerns regarding medicines are outlined above. Medication changes, Labs and Tests ordered today are summarized above and listed in the Patient Instructions accessible in Encounters.   Signed, Christell Faith, PA-C 02/28/2022 10:48 AM     [facility] [address] [place] [address] [place], [place] [address] [phone]

## 2022-02-27 ENCOUNTER — Ambulatory Visit: Payer: Medicare HMO | Admitting: Family Medicine

## 2022-02-27 ENCOUNTER — Encounter: Payer: Medicare HMO | Admitting: Family Medicine

## 2022-02-27 ENCOUNTER — Other Ambulatory Visit: Payer: Self-pay

## 2022-02-27 DIAGNOSIS — I4819 Other persistent atrial fibrillation: Secondary | ICD-10-CM

## 2022-02-27 LAB — GLUCOSE, CAPILLARY
Glucose-Capillary: 135 mg/dL — ABNORMAL HIGH (ref 70–99)
Glucose-Capillary: 228 mg/dL — ABNORMAL HIGH (ref 70–99)

## 2022-02-27 NOTE — Progress Notes (Signed)
Carelink Summary Report / Loop Recorder 

## 2022-02-28 ENCOUNTER — Encounter: Payer: Self-pay | Admitting: Physician Assistant

## 2022-02-28 ENCOUNTER — Ambulatory Visit: Payer: Medicare HMO | Attending: Physician Assistant | Admitting: Physician Assistant

## 2022-02-28 ENCOUNTER — Inpatient Hospital Stay: Payer: Medicare HMO | Admitting: Family Medicine

## 2022-02-28 VITALS — BP 130/70 | HR 59 | Ht 65.0 in | Wt 180.6 lb

## 2022-02-28 DIAGNOSIS — I4819 Other persistent atrial fibrillation: Secondary | ICD-10-CM | POA: Diagnosis not present

## 2022-02-28 DIAGNOSIS — I471 Supraventricular tachycardia, unspecified: Secondary | ICD-10-CM

## 2022-02-28 DIAGNOSIS — Z79899 Other long term (current) drug therapy: Secondary | ICD-10-CM

## 2022-02-28 DIAGNOSIS — R5383 Other fatigue: Secondary | ICD-10-CM | POA: Diagnosis not present

## 2022-02-28 DIAGNOSIS — R002 Palpitations: Secondary | ICD-10-CM

## 2022-02-28 DIAGNOSIS — I340 Nonrheumatic mitral (valve) insufficiency: Secondary | ICD-10-CM | POA: Diagnosis not present

## 2022-02-28 DIAGNOSIS — I10 Essential (primary) hypertension: Secondary | ICD-10-CM

## 2022-02-28 DIAGNOSIS — I491 Atrial premature depolarization: Secondary | ICD-10-CM | POA: Diagnosis not present

## 2022-02-28 NOTE — Patient Instructions (Signed)
Medication Instructions:   Your physician recommends that you continue on your current medications as directed. Please refer to the Current Medication list given to you today.  *If you need a refill on your cardiac medications before your next appointment, please call your pharmacy*   Lab Work:  NONE  If you have labs (blood work) drawn today and your tests are completely normal, you will receive your results only by: [place] (if you have MyChart) OR A paper copy in the mail If you have any lab test that is abnormal or we need to change your treatment, we will call you to review the results.   Testing/Procedures:  NONE   Follow-Up: At [facility], you and your health needs are our priority.  As part of our continuing mission to provide you with exceptional heart care, we have created designated Provider Care Teams.  These Care Teams include your primary Cardiologist (physician) and Advanced Practice Providers (APPs -  Physician Assistants and Nurse Practitioners) who all work together to provide you with the care you need, when you need it.  We recommend signing up for the patient portal called "MyChart".  Sign up information is provided on this After Visit Summary.  MyChart is used to connect with patients for Virtual Visits (Telemedicine).  Patients are able to view lab/test results, encounter notes, upcoming appointments, etc.  Non-urgent messages can be sent to your provider as well.   To learn more about what you can do with MyChart, go to [email or web address].    Your next appointment:   6 month(s)  The format for your next appointment:   In Person  Provider:   You may see Kathlyn Sacramento, MD or one of the following Advanced Practice Providers on your designated Care Team:   Murray Hodgkins, NP Christell Faith, PA-C Cadence Kathlen Mody, PA-C Gerrie Nordmann, NP  Important Information About Sugar

## 2022-03-01 ENCOUNTER — Telehealth: Payer: Self-pay

## 2022-03-01 NOTE — Telephone Encounter (Signed)
Pt was returning nurse call.

## 2022-03-01 NOTE — Telephone Encounter (Signed)
Spoke with patient informed her that if the loop recorder didn't reset over the weekend she may have to come into the office to have device checked in person patient voiced understanding

## 2022-03-01 NOTE — Telephone Encounter (Signed)
Still getting Electrical Reset in Process, called [facility] tech services they stated [employer] had sent over the reset, [facility] services stated to check back on transmission status, 03/05/2022.

## 2022-03-05 ENCOUNTER — Ambulatory Visit (INDEPENDENT_AMBULATORY_CARE_PROVIDER_SITE_OTHER): Payer: Medicare HMO | Admitting: Family Medicine

## 2022-03-05 VITALS — BP 136/64 | HR 75 | Temp 97.7°F | Ht 65.0 in | Wt 182.6 lb

## 2022-03-05 DIAGNOSIS — M7501 Adhesive capsulitis of right shoulder: Secondary | ICD-10-CM

## 2022-03-05 DIAGNOSIS — F330 Major depressive disorder, recurrent, mild: Secondary | ICD-10-CM | POA: Diagnosis not present

## 2022-03-05 DIAGNOSIS — R69 Illness, unspecified: Secondary | ICD-10-CM | POA: Diagnosis not present

## 2022-03-05 NOTE — Assessment & Plan Note (Signed)
Improved control on 100 mg p.o. nightly of sertraline.  She is sleeping better with Ambien at night for sleep.

## 2022-03-05 NOTE — Patient Instructions (Signed)
Can use tylenol ES 2 capsule twice daily prn.  Continue home physical therapy.  Call if and when interested in formal PT.

## 2022-03-05 NOTE — Progress Notes (Signed)
Patient ID: [name], female    DOB: 07-01-1945, 77 y.o.   MRN: [id number]  This visit was conducted in person.  BP 136/64   Pulse 75   Temp 97.7 F (36.5 C) (Oral)   Ht '5\' 5"'$  (1.651 m)   Wt 182 lb 9.6 oz (82.8 kg)   LMP  (LMP Unknown)   SpO2 98%   BMI 30.39 kg/m    CC:  Chief Complaint  Patient presents with   Shoulder Pain    Right injection helped but has now 5/10 pain.     Subjective:   HPI: [name] is a 77 y.o. female presenting on 03/05/2022 for Shoulder Pain (Right injection helped but has now 5/10 pain. )  Reviewed last OV from Dr. Lorelei Pont [facility] on 12/19/2021  Dx with adhesive capsulitis left shoulder.  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 Right shoulder improved with steroid shot. At 10.2023 appt left steroid injection was performed.  Recommended continued home rehab.  Improved significantly, but now  right is hurting. ROM in left is normal but cannot do ROM exercises with right.  Not using an med for pain. 5/10 on pain scale.  Doing home PT 2 times aweek.   NSAIDs contraindicated given on Eliquis blood thinner.   Is  on call back schedule for ablation in  April for Atrial fibrillation.  On tikosyn... ambien helping with sleep. She feel memory is better now she is sleeping better.      03/05/2022    9:34 AM 11/27/2021    9:52 AM 10/26/2021    9:23 AM  PHQ9 SCORE ONLY  PHQ-9 Total Score '8 12 14     '$ Relevant past medical, surgical, family and social history reviewed and updated as indicated. Interim medical history since our last visit reviewed. Allergies and medications reviewed and updated. Outpatient Medications Prior to Visit  Medication Sig Dispense Refill   apixaban (ELIQUIS) 5 MG TABS tablet TAKE ONE TABLET BY MOUTH TWICE DAILY 60 tablet 6   Ascorbic Acid (VITAMIN C PO) Take 1 tablet by mouth at bedtime.     calcium carbonate (OS-CAL - DOSED IN MG OF ELEMENTAL CALCIUM) 1250 (500 Ca) MG tablet Take 1  tablet by mouth daily with breakfast.     dapagliflozin propanediol (FARXIGA) 5 MG TABS tablet Take 1 tablet (5 mg total) by mouth daily before breakfast. 90 tablet 3   dofetilide (TIKOSYN) 250 MCG capsule TAKE 1 CAPSULE BY MOUTH TWO TIMES DAILY 60 capsule 6   hydroxypropyl methylcellulose / hypromellose (ISOPTO TEARS / GONIOVISC) 2.5 % ophthalmic solution Place 1 drop into both eyes 2 (two) times daily as needed for dry eyes.     linagliptin (TRADJENTA) 5 MG TABS tablet TAKE 1 TABLET BY MOUTH DAILY 90 tablet 1   magnesium oxide (MAG-OX) 400 MG tablet Take 1 tablet (400 mg total) by mouth 2 (two) times daily. 60 tablet 6   metFORMIN (GLUCOPHAGE-XR) 500 MG 24 hr tablet TAKE TWO TABLETS BY MOUTH TWICE DAILY WITH MEALS 360 tablet 0   metoprolol succinate (TOPROL-XL) 25 MG 24 hr tablet Take one tablet by mouth at bedtime 90 tablet 3   ONETOUCH ULTRA test strip CHECK BLOOD SUGAR 2 TIMES A DAY AS DIRECTED DX CODE E11.9 100 strip 7   sertraline (ZOLOFT) 100 MG tablet Take 1 tablet (100 mg total) by mouth at bedtime. 30 tablet 5   zolpidem (AMBIEN) 10 MG tablet Take 10 mg by  mouth at bedtime.     atorvastatin (LIPITOR) 10 MG tablet TAKE ONE TABLET BY MOUTH AT BEDTIME (Patient not taking: Reported on 02/20/2022) 100 tablet 2   doxylamine, Sleep, (UNISOM) 25 MG tablet Take 25 mg by mouth at bedtime as needed. (Patient not taking: Reported on 02/20/2022)     MYRBETRIQ 50 MG TB24 tablet Take 50 mg by mouth daily.     No facility-administered medications prior to visit.     Per HPI unless specifically indicated in ROS section below Review of Systems  Constitutional:  Negative for fatigue and fever.  HENT:  Negative for congestion.   Eyes:  Negative for pain.  Respiratory:  Negative for cough and shortness of breath.   Cardiovascular:  Negative for chest pain, palpitations and leg swelling.  Gastrointestinal:  Negative for abdominal pain.  Genitourinary:  Negative for dysuria and vaginal bleeding.   Musculoskeletal:  Negative for back pain.  Neurological:  Negative for syncope, light-headedness and headaches.  Psychiatric/Behavioral:  Negative for dysphoric mood.    Objective:  BP 136/64   Pulse 75   Temp 97.7 F (36.5 C) (Oral)   Ht '5\' 5"'$  (1.651 m)   Wt 182 lb 9.6 oz (82.8 kg)   LMP  (LMP Unknown)   SpO2 98%   BMI 30.39 kg/m   Wt Readings from Last 3 Encounters:  03/05/22 182 lb 9.6 oz (82.8 kg)  02/28/22 180 lb 9.6 oz (81.9 kg)  02/20/22 182 lb 12.8 oz (82.9 kg)      Physical Exam Constitutional:      General: She is not in acute distress.    Appearance: Normal appearance. She is well-developed. She is not ill-appearing or toxic-appearing.  HENT:     Head: Normocephalic.     Right Ear: Hearing, tympanic membrane, ear canal and external ear normal. Tympanic membrane is not erythematous, retracted or bulging.     Left Ear: Hearing, tympanic membrane, ear canal and external ear normal. Tympanic membrane is not erythematous, retracted or bulging.     Nose: No mucosal edema or rhinorrhea.     Right Sinus: No maxillary sinus tenderness or frontal sinus tenderness.     Left Sinus: No maxillary sinus tenderness or frontal sinus tenderness.     Mouth/Throat:     Pharynx: Uvula midline.  Eyes:     General: Lids are normal. Lids are everted, no foreign bodies appreciated.     Conjunctiva/sclera: Conjunctivae normal.     Pupils: Pupils are equal, round, and reactive to light.  Neck:     Thyroid: No thyroid mass or thyromegaly.     Vascular: No carotid bruit.     Trachea: Trachea normal.  Cardiovascular:     Rate and Rhythm: Normal rate and regular rhythm.     Pulses: Normal pulses.     Heart sounds: Normal heart sounds, S1 normal and S2 normal. No murmur heard.    No friction rub. No gallop.  Pulmonary:     Effort: Pulmonary effort is normal. No tachypnea or respiratory distress.     Breath sounds: Normal breath sounds. No decreased breath sounds, wheezing, rhonchi or  rales.  Abdominal:     General: Bowel sounds are normal.     Palpations: Abdomen is soft.     Tenderness: There is no abdominal tenderness.  Musculoskeletal:     Right shoulder: Tenderness and bony tenderness present. No swelling or deformity. Decreased range of motion.     Left shoulder: No swelling, deformity, tenderness  or bony tenderness. Decreased range of motion.     Cervical back: Normal, normal range of motion and neck supple.     Thoracic back: Normal.  Skin:    General: Skin is warm and dry.     Findings: No rash.  Neurological:     Mental Status: She is alert.  Psychiatric:        Mood and Affect: Mood is not anxious or depressed.        Speech: Speech normal.        Behavior: Behavior normal. Behavior is cooperative.        Thought Content: Thought content normal.        Judgment: Judgment normal.       Results for orders placed or performed during the hospital encounter of 02/18/22  Resp panel by RT-PCR (RSV, Flu A&B, Covid) Anterior Nasal Swab   Specimen: Anterior Nasal Swab  Result Value Ref Range   SARS Coronavirus 2 by RT PCR NEGATIVE NEGATIVE   Influenza A by PCR NEGATIVE NEGATIVE   Influenza B by PCR NEGATIVE NEGATIVE   Resp Syncytial Virus by PCR NEGATIVE NEGATIVE  Urine Culture   Specimen: Urine, Clean Catch  Result Value Ref Range   Specimen Description      URINE, CLEAN CATCH Performed at [facility], [address] [place], [place] [address]    Special Requests      NONE Performed at [facility], [address] [place], [place] [address]    Culture MULTIPLE SPECIES PRESENT, SUGGEST RECOLLECTION (A)    Report Status 02/20/2022 FINAL   Comprehensive metabolic panel  Result Value Ref Range   Sodium 135 135 - 145 mmol/L   Potassium 4.1 3.5 - 5.1 mmol/L   Chloride 102 98 - 111 mmol/L   CO2 24 22 - 32 mmol/L   Glucose, Bld 220 (H) 70 - 99 mg/dL   BUN 20 8 - 23 mg/dL   Creatinine, Ser 1.19 (H) 0.44 - 1.00 mg/dL   Calcium  9.3 8.9 - 10.3 mg/dL   Total Protein 7.6 6.5 - 8.1 g/dL   Albumin 4.1 3.5 - 5.0 g/dL   AST 24 15 - 41 U/L   ALT 15 0 - 44 U/L   Alkaline Phosphatase 79 38 - 126 U/L   Total Bilirubin 0.6 0.3 - 1.2 mg/dL   GFR, Estimated 48 (L) >60 mL/min   Anion gap 9 5 - 15  CBC with Differential  Result Value Ref Range   WBC 6.8 4.0 - 10.5 K/uL   RBC 4.24 3.87 - 5.11 MIL/uL   Hemoglobin 11.7 (L) 12.0 - 15.0 g/dL   HCT 37.7 36.0 - 46.0 %   MCV 88.9 80.0 - 100.0 fL   MCH 27.6 26.0 - 34.0 pg   MCHC 31.0 30.0 - 36.0 g/dL   RDW 14.2 11.5 - 15.5 %   Platelets 391 150 - 400 K/uL   nRBC 0.0 0.0 - 0.2 %   Neutrophils Relative % 56 %   Neutro Abs 3.8 1.7 - 7.7 K/uL   Lymphocytes Relative 32 %   Lymphs Abs 2.2 0.7 - 4.0 K/uL   Monocytes Relative 8 %   Monocytes Absolute 0.6 0.1 - 1.0 K/uL   Eosinophils Relative 3 %   Eosinophils Absolute 0.2 0.0 - 0.5 K/uL   Basophils Relative 1 %   Basophils Absolute 0.1 0.0 - 0.1 K/uL   Immature Granulocytes 0 %   Abs Immature Granulocytes 0.02 0.00 -  0.07 K/uL  Urinalysis, Routine w reflex microscopic Urine, Clean Catch  Result Value Ref Range   Color, Urine STRAW (A) YELLOW   APPearance HAZY (A) CLEAR   Specific Gravity, Urine 1.009 1.005 - 1.030   pH 5.0 5.0 - 8.0   Glucose, UA >=500 (A) NEGATIVE mg/dL   Hgb urine dipstick MODERATE (A) NEGATIVE   Bilirubin Urine NEGATIVE NEGATIVE   Ketones, ur NEGATIVE NEGATIVE mg/dL   Protein, ur NEGATIVE NEGATIVE mg/dL   Nitrite NEGATIVE NEGATIVE   Leukocytes,Ua MODERATE (A) NEGATIVE   RBC / HPF 0-5 0 - 5 RBC/hpf   WBC, UA 0-5 0 - 5 WBC/hpf   Bacteria, UA RARE (A) NONE SEEN   Squamous Epithelial / LPF 6-10 0 - 5   Mucus PRESENT   TSH  Result Value Ref Range   TSH 3.291 0.350 - 4.500 uIU/mL  T4, free  Result Value Ref Range   Free T4 0.74 0.61 - 1.12 ng/dL  Magnesium  Result Value Ref Range   Magnesium 2.1 1.7 - 2.4 mg/dL  Basic metabolic panel  Result Value Ref Range   Sodium 138 135 - 145 mmol/L    Potassium 4.5 3.5 - 5.1 mmol/L   Chloride 111 98 - 111 mmol/L   CO2 23 22 - 32 mmol/L   Glucose, Bld 205 (H) 70 - 99 mg/dL   BUN 21 8 - 23 mg/dL   Creatinine, Ser 0.89 0.44 - 1.00 mg/dL   Calcium 9.1 8.9 - 10.3 mg/dL   GFR, Estimated >60 >60 mL/min   Anion gap 4 (L) 5 - 15  CBC  Result Value Ref Range   WBC 6.5 4.0 - 10.5 K/uL   RBC 3.87 3.87 - 5.11 MIL/uL   Hemoglobin 10.5 (L) 12.0 - 15.0 g/dL   HCT 34.0 (L) 36.0 - 46.0 %   MCV 87.9 80.0 - 100.0 fL   MCH 27.1 26.0 - 34.0 pg   MCHC 30.9 30.0 - 36.0 g/dL   RDW 14.2 11.5 - 15.5 %   Platelets 336 150 - 400 K/uL   nRBC 0.0 0.0 - 0.2 %  Glucose, capillary  Result Value Ref Range   Glucose-Capillary 135 (H) 70 - 99 mg/dL  Glucose, capillary  Result Value Ref Range   Glucose-Capillary 228 (H) 70 - 99 mg/dL  POC CBG, ED  Result Value Ref Range   Glucose-Capillary 156 (H) 70 - 99 mg/dL  POC CBG, ED  Result Value Ref Range   Glucose-Capillary 134 (H) 70 - 99 mg/dL  Troponin I (High Sensitivity)  Result Value Ref Range   Troponin I (High Sensitivity) 11 <18 ng/L  Troponin I (High Sensitivity)  Result Value Ref Range   Troponin I (High Sensitivity) 18 (H) <18 ng/L     COVID 19 screen:  No recent travel or known exposure to COVID19 The patient denies respiratory symptoms of COVID 19 at this time. The importance of social distancing was discussed today.   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 nightly of sertraline.  She is sleeping better with Ambien at night for sleep.      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 Home physical therapy limited given pain. NSAIDs contraindicated given on Eliquis anticoagulation. Encouraged her to use Tylenol extra strength twice daily as needed for pain  especially to allow her to increase physical therapy. I offered  formal referral to physical therapy but she has a lot on her plate right now given upcoming ablation for atrial fibrillation. It is too early for repeat steroid injection but if her pain continues she will consider following up with Dr. Lorelei Pont.        Eliezer Lofts, MD

## 2022-03-05 NOTE — Assessment & Plan Note (Signed)
Acute, recurrent flare. Home physical therapy limited given pain. NSAIDs contraindicated given on Eliquis anticoagulation. Encouraged her to use Tylenol extra strength twice daily as needed for pain especially to allow her to increase physical therapy. I offered formal referral to physical therapy but she has a lot on her plate right now given upcoming ablation for atrial fibrillation. It is too early for repeat steroid injection but if her pain continues she will consider following up with Dr. Lorelei Pont.

## 2022-03-06 NOTE — Telephone Encounter (Signed)
Recheck of [employer] website indicates device has been reset and counters have resumed.  No action needed.

## 2022-03-25 ENCOUNTER — Ambulatory Visit: Payer: Medicare HMO | Attending: Internal Medicine

## 2022-03-25 DIAGNOSIS — I4819 Other persistent atrial fibrillation: Secondary | ICD-10-CM

## 2022-03-27 LAB — CUP PACEART REMOTE DEVICE CHECK
Date Time Interrogation Session: 20240119231540
Implantable Pulse Generator Implant Date: 20210816

## 2022-03-28 NOTE — Progress Notes (Signed)
[facility] Summary Report / Loop Recorder

## 2022-04-03 ENCOUNTER — Encounter: Payer: Self-pay | Admitting: Internal Medicine

## 2022-04-03 ENCOUNTER — Ambulatory Visit: Payer: Medicare HMO | Admitting: Internal Medicine

## 2022-04-03 VITALS — BP 120/68 | HR 67 | Ht 65.0 in | Wt 183.2 lb

## 2022-04-03 DIAGNOSIS — E7800 Pure hypercholesterolemia, unspecified: Secondary | ICD-10-CM | POA: Diagnosis not present

## 2022-04-03 DIAGNOSIS — E1165 Type 2 diabetes mellitus with hyperglycemia: Secondary | ICD-10-CM

## 2022-04-03 DIAGNOSIS — E063 Autoimmune thyroiditis: Secondary | ICD-10-CM

## 2022-04-03 LAB — POCT GLYCOSYLATED HEMOGLOBIN (HGB A1C): Hemoglobin A1C: 7.7 % — AB (ref 4.0–5.6)

## 2022-04-03 MED ORDER — REPAGLINIDE 1 MG PO TABS: 1.0000 mg | ORAL_TABLET | Freq: Two times a day (BID) | ORAL | 3 refills | Status: DC

## 2022-04-03 NOTE — Patient Instructions (Addendum)
Please continue: - Metformin ER 500 mg 2x a day - Tradjenta  5 mg before b'fast  Please start: - Prandin 1 mg before b'fast and dinner  Please return in 4 months with your sugar log.
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
## 2022-04-08 DIAGNOSIS — N3000 Acute cystitis without hematuria: Secondary | ICD-10-CM | POA: Diagnosis not present

## 2022-04-08 DIAGNOSIS — N3946 Mixed incontinence: Secondary | ICD-10-CM | POA: Diagnosis not present

## 2022-04-10 DIAGNOSIS — M19012 Primary osteoarthritis, left shoulder: Secondary | ICD-10-CM | POA: Diagnosis not present

## 2022-04-10 DIAGNOSIS — M19011 Primary osteoarthritis, right shoulder: Secondary | ICD-10-CM | POA: Diagnosis not present

## 2022-04-10 DIAGNOSIS — M542 Cervicalgia: Secondary | ICD-10-CM | POA: Diagnosis not present

## 2022-04-11 ENCOUNTER — Encounter (HOSPITAL_COMMUNITY): Payer: Self-pay | Admitting: *Deleted

## 2022-04-11 ENCOUNTER — Telehealth: Payer: Self-pay | Admitting: Cardiovascular Disease

## 2022-04-11 NOTE — Telephone Encounter (Signed)
Returned the call to the patient. She stated that she needs to have surgery on her shoulders but cannot until she has her ablation. She would like to know if she can have the ablation done sooner.

## 2022-04-11 NOTE — Telephone Encounter (Signed)
Calling to see if her ablation can be sooner. She has to have surgery on her shoulders. Please advise

## 2022-04-12 NOTE — Telephone Encounter (Signed)
Spoke with the patient and advised there is no current openings for a sooner ablation. She is on the wait list and we will call her if something opens up sooner.

## 2022-04-16 DIAGNOSIS — H0288A Meibomian gland dysfunction right eye, upper and lower eyelids: Secondary | ICD-10-CM | POA: Diagnosis not present

## 2022-04-17 ENCOUNTER — Other Ambulatory Visit
Admission: RE | Admit: 2022-04-17 | Discharge: 2022-04-17 | Disposition: A | Discharge status: Home / Self Care | Payer: Medicare HMO | Source: Ambulatory Visit | Attending: Cardiology | Admitting: Cardiology

## 2022-04-17 ENCOUNTER — Telehealth (HOSPITAL_COMMUNITY): Payer: Self-pay | Admitting: *Deleted

## 2022-04-17 ENCOUNTER — Other Ambulatory Visit: Payer: Self-pay

## 2022-04-17 DIAGNOSIS — I4819 Other persistent atrial fibrillation: Secondary | ICD-10-CM | POA: Diagnosis not present

## 2022-04-17 LAB — BASIC METABOLIC PANEL
Anion gap: 10 (ref 5–15)
BUN: 20 mg/dL (ref 8–23)
CO2: 24 mmol/L (ref 22–32)
Calcium: 8.7 mg/dL — ABNORMAL LOW (ref 8.9–10.3)
Chloride: 99 mmol/L (ref 98–111)
Creatinine, Ser: 1.09 mg/dL — ABNORMAL HIGH (ref 0.44–1.00)
GFR, Estimated: 53 mL/min — ABNORMAL LOW (ref >60)
Glucose, Bld: 234 mg/dL — ABNORMAL HIGH (ref 70–99)
Potassium: 4.6 mmol/L (ref 3.5–5.1)
Sodium: 133 mmol/L — ABNORMAL LOW (ref 135–145)

## 2022-04-17 LAB — CBC
HCT: 33.6 % — ABNORMAL LOW (ref 36.0–46.0)
Hemoglobin: 10.5 g/dL — ABNORMAL LOW (ref 12.0–15.0)
MCH: 27 pg (ref 26.0–34.0)
MCHC: 31.3 g/dL (ref 30.0–36.0)
MCV: 86.4 fL (ref 80.0–100.0)
Platelets: 321 10*3/uL (ref 150–400)
RBC: 3.89 MIL/uL (ref 3.87–5.11)
RDW: 14.3 % (ref 11.5–15.5)
WBC: 8.2 10*3/uL (ref 4.0–10.5)
nRBC: 0 % (ref 0.0–0.2)

## 2022-04-17 MED ORDER — PREDNISONE 50 MG PO TABS: ORAL_TABLET | ORAL | 0 refills | Status: DC

## 2022-04-17 NOTE — Telephone Encounter (Signed)
Called patient to r/s cardiac CT study due to lack of insurance authorization. Patient agreeable to coming on Monday at [facility] [phone] Office [phone] Cell

## 2022-04-17 NOTE — Telephone Encounter (Signed)
Reaching out to patient to offer assistance regarding upcoming cardiac imaging study; pt verbalizes understanding of appt date/time, parking situation and where to check in, pre-test NPO status and medications ordered, and verified current allergies; name and call back number provided for further questions should they arise  Gordy Clement RN [facility] [phone] office [phone] cell  Reviewed how to take 13 hour prep with patient and she verbalized understanding.

## 2022-04-18 ENCOUNTER — Telehealth: Payer: Self-pay | Admitting: Internal Medicine

## 2022-04-18 ENCOUNTER — Ambulatory Visit: Admission: RE | Admit: 2022-04-18 | Payer: Medicare HMO | Source: Ambulatory Visit

## 2022-04-18 NOTE — Telephone Encounter (Signed)
Patient received mychart message that her ablation was cancel. Inform patient I dont see where it has been cancel. Patient is willing to pay rather insurance does or notPlease advise

## 2022-04-19 ENCOUNTER — Ambulatory Visit: Payer: Medicare HMO

## 2022-04-22 ENCOUNTER — Ambulatory Visit
Admission: RE | Admit: 2022-04-22 | Discharge: 2022-04-22 | Disposition: A | Discharge status: Home / Self Care | Payer: Medicare HMO | Source: Ambulatory Visit | Attending: Cardiology | Admitting: Cardiology

## 2022-04-22 DIAGNOSIS — I4819 Other persistent atrial fibrillation: Secondary | ICD-10-CM | POA: Insufficient documentation

## 2022-04-22 MED ORDER — DIPHENHYDRAMINE HCL 50 MG/ML IJ SOLN: 50.0000 mg | Freq: Once | INTRAMUSCULAR | Status: DC

## 2022-04-22 MED ORDER — PREDNISONE 50 MG PO TABS: 50.0000 mg | ORAL_TABLET | Freq: Four times a day (QID) | ORAL | Status: DC

## 2022-04-22 MED ORDER — DIPHENHYDRAMINE HCL 50 MG PO CAPS: 50.0000 mg | ORAL_CAPSULE | Freq: Once | ORAL | Status: DC

## 2022-04-22 MED ORDER — NITROGLYCERIN 0.4 MG SL SUBL
0.8000 mg | SUBLINGUAL_TABLET | Freq: Once | SUBLINGUAL | Status: AC
  Administered 2022-04-22: 0.8 mg via SUBLINGUAL

## 2022-04-22 MED ORDER — IOHEXOL 350 MG/ML SOLN
100.0000 mL | Freq: Once | INTRAVENOUS | Status: AC | PRN
  Administered 2022-04-22: 100 mL via INTRAVENOUS

## 2022-04-22 NOTE — Progress Notes (Signed)
Patient tolerated procedure well. Ambulate w/o difficulty. Denies any lightheadedness or being dizzy. Pt denies any pain at this time. Sitting in chair, pt is encouraged to drink additional water throughout the day and reason explained to patient. Patient verbalized understanding and all questions answered. ABC intact. No further needs at this time. Discharge from procedure area w/o issues.  

## 2022-04-22 NOTE — Pre-Procedure Instructions (Signed)
Attempted to call patient regarding procedure instructions for tomorrow.  Left voice mail on the following items: Arrival time 0830 Nothing to eat or drink after midnight No meds AM of procedure Responsible person to drive you home and stay with you for 24 hrs  Have you missed any doses of anti-coagulant- Eliquis- if you have missed any doses please let office know.  Don't take dose in the morning.

## 2022-04-23 ENCOUNTER — Other Ambulatory Visit (HOSPITAL_COMMUNITY): Payer: Self-pay

## 2022-04-23 ENCOUNTER — Encounter (HOSPITAL_COMMUNITY)
Admission: RE | Disposition: A | Discharge status: Home / Self Care | Payer: Self-pay | Source: Home / Self Care | Attending: Cardiology

## 2022-04-23 ENCOUNTER — Ambulatory Visit (HOSPITAL_COMMUNITY): Payer: Medicare HMO | Admitting: Certified Registered Nurse Anesthetist

## 2022-04-23 ENCOUNTER — Other Ambulatory Visit: Payer: Self-pay

## 2022-04-23 ENCOUNTER — Ambulatory Visit (HOSPITAL_BASED_OUTPATIENT_CLINIC_OR_DEPARTMENT_OTHER): Payer: Medicare HMO | Admitting: Certified Registered Nurse Anesthetist

## 2022-04-23 ENCOUNTER — Ambulatory Visit (HOSPITAL_COMMUNITY)
Admission: RE | Admit: 2022-04-23 | Discharge: 2022-04-23 | Disposition: A | Discharge status: Home / Self Care | Payer: Medicare HMO | Attending: Cardiology | Admitting: Cardiology

## 2022-04-23 DIAGNOSIS — Z7984 Long term (current) use of oral hypoglycemic drugs: Secondary | ICD-10-CM | POA: Insufficient documentation

## 2022-04-23 DIAGNOSIS — Z7901 Long term (current) use of anticoagulants: Secondary | ICD-10-CM | POA: Diagnosis not present

## 2022-04-23 DIAGNOSIS — E785 Hyperlipidemia, unspecified: Secondary | ICD-10-CM | POA: Insufficient documentation

## 2022-04-23 DIAGNOSIS — I10 Essential (primary) hypertension: Secondary | ICD-10-CM

## 2022-04-23 DIAGNOSIS — Z9989 Dependence on other enabling machines and devices: Secondary | ICD-10-CM

## 2022-04-23 DIAGNOSIS — I484 Atypical atrial flutter: Secondary | ICD-10-CM | POA: Diagnosis not present

## 2022-04-23 DIAGNOSIS — G4733 Obstructive sleep apnea (adult) (pediatric): Secondary | ICD-10-CM

## 2022-04-23 DIAGNOSIS — I4819 Other persistent atrial fibrillation: Secondary | ICD-10-CM | POA: Diagnosis not present

## 2022-04-23 DIAGNOSIS — E119 Type 2 diabetes mellitus without complications: Secondary | ICD-10-CM | POA: Diagnosis not present

## 2022-04-23 DIAGNOSIS — I483 Typical atrial flutter: Secondary | ICD-10-CM

## 2022-04-23 DIAGNOSIS — E039 Hypothyroidism, unspecified: Secondary | ICD-10-CM | POA: Insufficient documentation

## 2022-04-23 DIAGNOSIS — I4891 Unspecified atrial fibrillation: Secondary | ICD-10-CM | POA: Diagnosis not present

## 2022-04-23 HISTORY — PX: ATRIAL FIBRILLATION ABLATION: EP1191

## 2022-04-23 LAB — GLUCOSE, CAPILLARY
Glucose-Capillary: 138 mg/dL — ABNORMAL HIGH (ref 70–99)
Glucose-Capillary: 151 mg/dL — ABNORMAL HIGH (ref 70–99)
Glucose-Capillary: 173 mg/dL — ABNORMAL HIGH (ref 70–99)

## 2022-04-23 LAB — POCT ACTIVATED CLOTTING TIME
Activated Clotting Time: 244 seconds
Activated Clotting Time: 276 seconds

## 2022-04-23 SURGERY — ATRIAL FIBRILLATION ABLATION
Anesthesia: General

## 2022-04-23 MED ORDER — ONDANSETRON HCL 4 MG/2ML IJ SOLN
INTRAMUSCULAR | Status: DC | PRN
  Administered 2022-04-23: 4 mg via INTRAVENOUS

## 2022-04-23 MED ORDER — SODIUM CHLORIDE 0.9% FLUSH: 3.0000 mL | INTRAVENOUS | Status: DC | PRN

## 2022-04-23 MED ORDER — SODIUM CHLORIDE 0.9 % IV SOLN: INTRAVENOUS | Status: DC

## 2022-04-23 MED ORDER — PHENYLEPHRINE HCL (PRESSORS) 10 MG/ML IV SOLN
INTRAVENOUS | Status: DC | PRN
  Administered 2022-04-23: 160 ug via INTRAVENOUS

## 2022-04-23 MED ORDER — SUGAMMADEX SODIUM 200 MG/2ML IV SOLN
INTRAVENOUS | Status: DC | PRN
  Administered 2022-04-23: 150 mg via INTRAVENOUS

## 2022-04-23 MED ORDER — ROCURONIUM BROMIDE 10 MG/ML (PF) SYRINGE
PREFILLED_SYRINGE | INTRAVENOUS | Status: DC | PRN
  Administered 2022-04-23: 70 mg via INTRAVENOUS

## 2022-04-23 MED ORDER — PHENYLEPHRINE HCL-NACL 20-0.9 MG/250ML-% IV SOLN
INTRAVENOUS | Status: DC | PRN
  Administered 2022-04-23: 25 ug/min via INTRAVENOUS

## 2022-04-23 MED ORDER — PROPOFOL 10 MG/ML IV BOLUS
INTRAVENOUS | Status: DC | PRN
  Administered 2022-04-23: 40 mg via INTRAVENOUS
  Administered 2022-04-23: 120 mg via INTRAVENOUS
  Administered 2022-04-23: 40 mg via INTRAVENOUS

## 2022-04-23 MED ORDER — HEPARIN SODIUM (PORCINE) 1000 UNIT/ML IJ SOLN
INTRAMUSCULAR | Status: DC | PRN
  Administered 2022-04-23: 5000 [IU] via INTRAVENOUS
  Administered 2022-04-23: 13000 [IU] via INTRAVENOUS
  Administered 2022-04-23: 4000 [IU] via INTRAVENOUS

## 2022-04-23 MED ORDER — HEPARIN SODIUM (PORCINE) 1000 UNIT/ML IJ SOLN
INTRAMUSCULAR | Status: DC | PRN
  Administered 2022-04-23: 1000 [IU] via INTRAVENOUS

## 2022-04-23 MED ORDER — HEPARIN SODIUM (PORCINE) 1000 UNIT/ML IJ SOLN
INTRAMUSCULAR | Status: AC
  Filled 2022-04-23: qty 10

## 2022-04-23 MED ORDER — EPHEDRINE SULFATE (PRESSORS) 50 MG/ML IJ SOLN
INTRAMUSCULAR | Status: DC | PRN
  Administered 2022-04-23: 10 mg via INTRAVENOUS
  Administered 2022-04-23: 15 mg via INTRAVENOUS

## 2022-04-23 MED ORDER — PROTAMINE SULFATE 10 MG/ML IV SOLN
INTRAVENOUS | Status: DC | PRN
  Administered 2022-04-23: 10 mg via INTRAVENOUS

## 2022-04-23 MED ORDER — COLCHICINE 0.6 MG PO TABS
0.6000 mg | ORAL_TABLET | Freq: Two times a day (BID) | ORAL | Status: DC
  Administered 2022-04-23: 0.6 mg via ORAL
  Filled 2022-04-23: qty 1

## 2022-04-23 MED ORDER — LIDOCAINE 2% (20 MG/ML) 5 ML SYRINGE
INTRAMUSCULAR | Status: DC | PRN
  Administered 2022-04-23: 60 mg via INTRAVENOUS

## 2022-04-23 MED ORDER — DEXAMETHASONE SODIUM PHOSPHATE 10 MG/ML IJ SOLN
INTRAMUSCULAR | Status: DC | PRN
  Administered 2022-04-23: 5 mg via INTRAVENOUS

## 2022-04-23 MED ORDER — PANTOPRAZOLE SODIUM 40 MG PO TBEC
40.0000 mg | DELAYED_RELEASE_TABLET | Freq: Every day | ORAL | Status: DC
  Administered 2022-04-23: 40 mg via ORAL
  Filled 2022-04-23: qty 1

## 2022-04-23 MED ORDER — HEPARIN (PORCINE) IN NACL 1000-0.9 UT/500ML-% IV SOLN
INTRAVENOUS | Status: DC | PRN
  Administered 2022-04-23 (×3): 500 mL

## 2022-04-23 MED ORDER — ACETAMINOPHEN 325 MG PO TABS: 650.0000 mg | ORAL_TABLET | ORAL | Status: DC | PRN

## 2022-04-23 MED ORDER — APIXABAN 5 MG PO TABS
5.0000 mg | ORAL_TABLET | Freq: Two times a day (BID) | ORAL | Status: DC
  Administered 2022-04-23: 5 mg via ORAL
  Filled 2022-04-23: qty 1

## 2022-04-23 MED ORDER — PANTOPRAZOLE SODIUM 40 MG PO TBEC
40.0000 mg | DELAYED_RELEASE_TABLET | Freq: Every day | ORAL | 0 refills | Status: DC
  Filled 2022-04-23: qty 45, 45d supply, fill #0

## 2022-04-23 MED ORDER — SODIUM CHLORIDE 0.9 % IV SOLN: 250.0000 mL | INTRAVENOUS | Status: DC | PRN

## 2022-04-23 MED ORDER — LACTATED RINGERS IV SOLN: INTRAVENOUS | Status: DC | PRN

## 2022-04-23 MED ORDER — SODIUM CHLORIDE 0.9% FLUSH: 3.0000 mL | Freq: Two times a day (BID) | INTRAVENOUS | Status: DC

## 2022-04-23 MED ORDER — COLCHICINE 0.6 MG PO TABS
0.6000 mg | ORAL_TABLET | Freq: Two times a day (BID) | ORAL | 0 refills | Status: DC
  Filled 2022-04-23: qty 10, 5d supply, fill #0

## 2022-04-23 MED ORDER — FENTANYL CITRATE (PF) 250 MCG/5ML IJ SOLN
INTRAMUSCULAR | Status: DC | PRN
  Administered 2022-04-23: 100 ug via INTRAVENOUS

## 2022-04-23 MED ORDER — ONDANSETRON HCL 4 MG/2ML IJ SOLN: 4.0000 mg | Freq: Four times a day (QID) | INTRAMUSCULAR | Status: DC | PRN

## 2022-04-23 SURGICAL SUPPLY — 19 items
BAG SNAP BAND KOVER 36X36 (MISCELLANEOUS) IMPLANT
CATH 8FR REPROCESSED SOUNDSTAR (CATHETERS) ×1 IMPLANT
CATH 8FR SOUNDSTAR REPROCESSED (CATHETERS) IMPLANT
CATH ABLAT QDOT MICRO BI TC DF (CATHETERS) IMPLANT
CATH OCTARAY 2.0 F 3-3-3-3-3 (CATHETERS) IMPLANT
CATH S-M CIRCA TEMP PROBE (CATHETERS) IMPLANT
CATH WEB BI DIR CSDF CRV REPRO (CATHETERS) IMPLANT
CLOSURE PERCLOSE PROSTYLE (VASCULAR PRODUCTS) IMPLANT
COVER SWIFTLINK CONNECTOR (BAG) ×1 IMPLANT
PACK EP LATEX FREE (CUSTOM PROCEDURE TRAY) ×1
PACK EP LF (CUSTOM PROCEDURE TRAY) ×1 IMPLANT
PAD DEFIB RADIO PHYSIO CONN (PAD) ×1 IMPLANT
PATCH CARTO3 (PAD) IMPLANT
SHEATH BAYLIS TRANSSEPTAL 98CM (NEEDLE) IMPLANT
SHEATH CARTO VIZIGO SM CVD (SHEATH) IMPLANT
SHEATH PINNACLE 8F 10CM (SHEATH) IMPLANT
SHEATH PINNACLE 9F 10CM (SHEATH) IMPLANT
SHEATH PROBE COVER 6X72 (BAG) IMPLANT
TUBING SMART ABLATE COOLFLOW (TUBING) IMPLANT

## 2022-04-23 NOTE — Anesthesia Preprocedure Evaluation (Signed)
Anesthesia Evaluation  Patient identified by MRN, date of birth, ID band Patient awake    Reviewed: Allergy & Precautions, H&P , NPO status , Patient's Chart, lab work & pertinent test results  Airway Mallampati: III  TM Distance: <3 FB Neck ROM: Full    Dental no notable dental hx.    Pulmonary sleep apnea and Continuous Positive Airway Pressure Ventilation    Pulmonary exam normal breath sounds clear to auscultation       Cardiovascular hypertension, Normal cardiovascular exam+ dysrhythmias Atrial Fibrillation  Rhythm:Irregular Rate:Normal  Pharmacological myocardial perfusion imaging study with no significant  ischemia GI uptake artifact noted Normal wall motion, EF estimated at 75% No EKG changes concerning for ischemia at peak stress or in recovery. CT attenuation correction images with no significant coronary calcification, minimal aortic atherosclerosis Low risk scan    Neuro/Psych negative neurological ROS  negative psych ROS   GI/Hepatic Neg liver ROS,GERD  ,,  Endo/Other  diabetes, Type 2Hypothyroidism    Renal/GU negative Renal ROS  negative genitourinary   Musculoskeletal negative musculoskeletal ROS (+)    Abdominal   Peds negative pediatric ROS (+)  Hematology  (+) Blood dyscrasia, anemia   Anesthesia Other Findings   Reproductive/Obstetrics negative OB ROS                             Anesthesia Physical Anesthesia Plan  ASA: 3  Anesthesia Plan: General   Post-op Pain Management: Minimal or no pain anticipated   Induction: Intravenous  PONV Risk Score and Plan: 2 and Ondansetron, Dexamethasone and Treatment may vary due to age or medical condition  Airway Management Planned: Oral ETT  Additional Equipment:   Intra-op Plan:   Post-operative Plan: Extubation in OR  Informed Consent: I have reviewed the patients History and Physical, chart, labs and discussed  the procedure including the risks, benefits and alternatives for the proposed anesthesia with the patient or authorized representative who has indicated his/her understanding and acceptance.     Dental advisory given  Plan Discussed with: CRNA and Surgeon  Anesthesia Plan Comments:        Anesthesia Quick Evaluation

## 2022-04-23 NOTE — Progress Notes (Signed)
Patient and husband was given discharge instructions. Both verbalized understanding. 

## 2022-04-23 NOTE — Discharge Instructions (Signed)
Post procedure care instructions No driving for 4 days. No lifting over 5 lbs for 1 week. No vigorous or sexual activity for 1 week. You may return to work/your usual activities on 04/2822. Keep procedure site clean & dry. If you notice increased pain, swelling, bleeding or pus, call/return!  You may shower after 24 hours, but no soaking in baths/hot tubs/pools for 1 week.   You have an appointment set up with the [facility].  Multiple studies have shown that being followed by a dedicated atrial fibrillation clinic in addition to the standard care you receive from your other physicians improves health. We believe that enrollment in the atrial fibrillation clinic will allow [name] to better care for you.   The phone number to the [facility] is [phone]. The clinic is staffed Monday through Friday from 8:30am to 5pm.  Directions: The clinic is located in the [facility], [place] the hospital at the MAIN ENTRANCE "A", use [address] to the 6th floor.  Registration desk to the right of elevators on 6th floor  If you have any trouble locating the clinic, please don't hesitate to call [phone].

## 2022-04-23 NOTE — Anesthesia Postprocedure Evaluation (Signed)
Anesthesia Post Note  Patient: [name]  Procedure(s) Performed: ATRIAL FIBRILLATION ABLATION     Patient location during evaluation: PACU Anesthesia Type: General Level of consciousness: awake and alert Pain management: pain level controlled Vital Signs Assessment: post-procedure vital signs reviewed and stable Respiratory status: spontaneous breathing, nonlabored ventilation, respiratory function stable and patient connected to nasal cannula oxygen Cardiovascular status: blood pressure returned to baseline and stable Postop Assessment: no apparent nausea or vomiting Anesthetic complications: no  There were no known notable events for this encounter.  Last Vitals:  Vitals:   04/23/22 1244 04/23/22 1255  BP: (!) 147/60 139/64  Pulse: 80 81  Resp: 16 16  Temp: 36.6 C   SpO2: 100% 100%    Last Pain:  Vitals:   04/23/22 1255  TempSrc:   PainSc: 0-No pain                 Aliah Eriksson S

## 2022-04-23 NOTE — Anesthesia Procedure Notes (Addendum)
Procedure Name: Intubation Date/Time: 04/23/2022 10:40 AM  Performed by: Glynda Jaeger, CRNAPre-anesthesia Checklist: Patient identified, Patient being monitored, Timeout performed, Emergency Drugs available and Suction available Patient Re-evaluated:Patient Re-evaluated prior to induction Oxygen Delivery Method: Circle System Utilized Preoxygenation: Pre-oxygenation with 100% oxygen Induction Type: IV induction Ventilation: Mask ventilation without difficulty Laryngoscope Size: Mac and 3 Grade View: Grade II Tube type: Oral Tube size: 7.0 mm Number of attempts: 1 Airway Equipment and Method: Stylet Placement Confirmation: ETT inserted through vocal cords under direct vision, positive ETCO2 and breath sounds checked- equal and bilateral Secured at: 22 cm Tube secured with: Tape Dental Injury: Teeth and Oropharynx as per pre-operative assessment

## 2022-04-23 NOTE — Transfer of Care (Signed)
Immediate Anesthesia Transfer of Care Note  Patient: [name]  Procedure(s) Performed: ATRIAL FIBRILLATION ABLATION  Patient Location: Cath Lab  Anesthesia Type:General  Level of Consciousness: awake, alert , patient cooperative, and responds to stimulation  Airway & Oxygen Therapy: Patient Spontanous Breathing and Patient connected to nasal cannula oxygen  Post-op Assessment: Report given to RN and Post -op Vital signs reviewed and stable  Post vital signs: Reviewed and stable  Last Vitals:  Vitals Value Taken Time  BP 139/64 04/23/22 1244  Temp 36.6 C 04/23/22 1244  Pulse 80 04/23/22 1244  Resp 16 04/23/22 1244  SpO2 100 % 04/23/22 1244    Last Pain:  Vitals:   04/23/22 1244  TempSrc: Tympanic  PainSc: 0-No pain      Patients Stated Pain Goal: 0 ([id number])  Complications: There were no known notable events for this encounter.

## 2022-04-23 NOTE — Anesthesia Procedure Notes (Deleted)
Procedure Name: Intubation Date/Time: 04/23/2022 10:45 AM  Performed by: Glynda Jaeger, CRNAPre-anesthesia Checklist: Patient identified, Emergency Drugs available, Suction available and Patient being monitored Patient Re-evaluated:Patient Re-evaluated prior to induction Oxygen Delivery Method: Circle System Utilized Preoxygenation: Pre-oxygenation with 100% oxygen Induction Type: IV induction Ventilation: Mask ventilation without difficulty Laryngoscope Size: Mac and 3 Grade View: Grade II Tube type: Oral Tube size: 7.0 mm Number of attempts: 2 Airway Equipment and Method: Stylet and Oral airway Placement Confirmation: ETT inserted through vocal cords under direct vision, positive ETCO2 and breath sounds checked- equal and bilateral Secured at: 22 cm Tube secured with: Tape Dental Injury: Teeth and Oropharynx as per pre-operative assessment

## 2022-04-23 NOTE — H&P (Signed)
Electrophysiology Office Note:     Date:  04/23/2022    ID:  [name], DOB Jun 01, 1945, MRN [id number]   PCP:  Jinny Sanders, MD            [facility] Cardiologist:  Kathlyn Sacramento, MD  [facility] Electrophysiologist:  Virl Axe, MD    Referring MD: Deboraha Sprang, MD    Chief Complaint: Atrial fibrillation   History of Present Illness:     [name] is a 77 y.o. female who presents for an evaluation of atrial fibrillation at the request of Dr. Caryl Comes. Their medical history includes atrial fibrillation post catheter ablation and Tikosyn, hypertension, hyperlipidemia, diabetes, hypothyroidism, obstructive sleep apnea on CPAP.  The patient was hospitalized yesterday with atrial fibrillation with rapid ventricular rates.  This was in the setting of a recent diagnosis of UTI being treated with Keflex.  Her ventricular rates were elevated at 123 during the cardiology evaluation yesterday.  She had a cardioversion yesterday.   She last saw Dr. Caryl Comes November 29, 2021.  He was referring to me to discuss possible repeat catheter ablation given her ongoing trouble with atrial fibrillation and flutters.     Presents for redo PVI.   Objective      Past Medical History:  Diagnosis Date   Anemia     Anxiety     Atrial fibrillation (HCC)     Colon polyps     Constipation     Depressive disorder, not elsewhere classified     Diabetes mellitus without complication ([place])     Diaphragmatic hernia without mention of obstruction or gangrene     Disorders of bursae and tendons in shoulder region, unspecified     Diverticulosis of colon (without mention of hemorrhage)     External hemorrhoid     Fundic gland polyps of stomach, benign     GERD (gastroesophageal reflux disease)     History of hiatal hernia     HTN (hypertension)     Hypertension     Hypothyroidism     Internal hemorrhoids without mention of complication     Moderate mitral regurgitation     Obesity,  unspecified     OSA on CPAP      compliant with CPAP   Other urinary incontinence     Persistent atrial fibrillation ([place])     Pure hypercholesterolemia     Type II diabetes mellitus ([place])     Urinary frequency     Urinary urgency     Vitamin B 12 deficiency             Past Surgical History:  Procedure Laterality Date   ABDOMINAL HYSTERECTOMY       ABDOMINAL HYSTERECTOMY   1972    Have both ovaries   ATRIAL FIBRILLATION ABLATION   12/17/2016   ATRIAL FIBRILLATION ABLATION N/A 12/17/2016    Procedure: ATRIAL FIBRILLATION ABLATION;  Surgeon: Thompson Grayer, MD;  Location: [place] [facility];  Service: Cardiovascular;  Laterality: N/A;   [place]    [place]; "Dr. [name]"   CARDIOVERSION N/A 05/27/2016    Procedure: CARDIOVERSION;  Surgeon: [name], MD;  Location: [facility];  Service: Cardiovascular;  Laterality: N/A;   CARDIOVERSION N/A 11/25/2016    Procedure: CARDIOVERSION;  Surgeon: [name], MD;  Location: [facility];  Service: Cardiovascular;  Laterality: N/A;   CARDIOVERSION N/A 09/18/2018    Procedure: CARDIOVERSION;  Surgeon: [name], MD;  Location: [facility];  Service: Cardiovascular;  Laterality: N/A;   CARDIOVERSION N/A 07/28/2019    Procedure: CARDIOVERSION;  Surgeon: Kate Sable, MD;  Location: [facility];  Service: Cardiovascular;  Laterality: N/A;   CARDIOVERSION N/A 09/07/2019    Procedure: CARDIOVERSION;  Surgeon: Nelva Bush, MD;  Location: [place] [facility];  Service: Cardiovascular;  Laterality: N/A;   CARDIOVERSION N/A 02/19/2022    Procedure: CARDIOVERSION;  Surgeon: Nelva Bush, MD;  Location: [facility];  Service: Cardiovascular;  Laterality: N/A;   CATARACT EXTRACTION W/ INTRAOCULAR LENS  IMPLANT, BILATERAL Bilateral     COLONOSCOPY       ELECTROPHYSIOLOGIC STUDY N/A 01/24/2016    Procedure: CARDIOVERSION;  Surgeon: [name], MD;  Location: [facility];  Service: Cardiovascular;  Laterality: N/A;   implantable  loop recorder implantation   10/18/2019    Medtronic Reveal [place] model [phone]) implantable loop recorder    LAPAROSCOPIC CHOLECYSTECTOMY       REDUCTION MAMMAPLASTY Bilateral     TONSILLECTOMY       TOTAL HIP ARTHROPLASTY Right 05/12/2018    Procedure: TOTAL HIP ARTHROPLASTY ANTERIOR APPROACH;  Surgeon: Paralee Cancel, MD;  Location: [facility];  Service: Orthopedics;  Laterality: Right;  70 mins      Current Medications: Active Medications      Current Meds  Medication Sig   apixaban (ELIQUIS) 5 MG TABS tablet TAKE ONE TABLET BY MOUTH TWICE DAILY   Ascorbic Acid (VITAMIN C PO) Take 1 tablet by mouth at bedtime.   calcium carbonate (OS-CAL - DOSED IN MG OF ELEMENTAL CALCIUM) 1250 (500 Ca) MG tablet Take 1 tablet by mouth daily with breakfast.   cefdinir (OMNICEF) 300 MG capsule Take 1 capsule (300 mg total) by mouth 2 (two) times daily for 5 days.   dapagliflozin propanediol (FARXIGA) 5 MG TABS tablet Take 1 tablet (5 mg total) by mouth daily before breakfast.   dofetilide (TIKOSYN) 250 MCG capsule TAKE 1 CAPSULE BY MOUTH TWO TIMES DAILY   hydroxypropyl methylcellulose / hypromellose (ISOPTO TEARS / GONIOVISC) 2.5 % ophthalmic solution Place 1 drop into both eyes 2 (two) times daily as needed for dry eyes.   magnesium oxide (MAG-OX) 400 MG tablet Take 1 tablet (400 mg total) by mouth 2 (two) times daily.   metFORMIN (GLUCOPHAGE-XR) 500 MG 24 hr tablet TAKE TWO TABLETS BY MOUTH TWICE DAILY WITH MEALS   metoprolol succinate (TOPROL-XL) 25 MG 24 hr tablet Take one tablet by mouth at bedtime   MYRBETRIQ 50 MG TB24 tablet Take 50 mg by mouth daily.   ONETOUCH ULTRA test strip CHECK BLOOD SUGAR 2 TIMES A DAY AS DIRECTED DX CODE E11.9   sertraline (ZOLOFT) 100 MG tablet Take 1 tablet (100 mg total) by mouth at bedtime.   zolpidem (AMBIEN) 10 MG tablet Take 10 mg by mouth at bedtime.        Allergies:   Cortisone, Pravachol [pravastatin sodium], and Contrast media [iodinated contrast  media]    Social History         Socioeconomic History   Marital status: Married      Spouse name: Jimmy   Number of children: 2   Years of education: Not on file   Highest education level: Not on file  Occupational History   Occupation: retired  Tobacco Use   Smoking status: Never   Smokeless tobacco: Never   Tobacco comments:      Never smoke 08/30/21  Vaping Use   Vaping Use: Never used  Substance  and Sexual Activity   Alcohol use: Yes      Comment: a little bit of wine at bedtime to help sleep   Drug use: No   Sexual activity: Yes  Other Topics Concern   Not on file  Social History Narrative    ** Merged History Encounter **         Exercise walks 3 times daily Lives with spouse in [place] - retired from employment 1 son and 1 daughter   Diet fruit, occ fast food, salads and lean meat   3 tea/day, no EtOH, no tobacco, no drugs      Social Determinants of Health        Financial Resource Strain: Low Risk  (08/31/2021)    Overall Financial Resource Strain (CARDIA)     Difficulty of Paying Living Expenses: Not hard at all  Food Insecurity: No Food Insecurity (08/31/2021)    Hunger Vital Sign     Worried About Running Out of Food in the Last Year: Never true     [place] in the Last Year: Never true  Transportation Needs: No Transportation Needs (08/31/2021)    PRAPARE - Armed forces logistics/support/administrative officer (Medical): No     Lack of Transportation (Non-Medical): No  Physical Activity: Unknown (08/31/2021)    Exercise Vital Sign     Days of Exercise per Week: 3 days     Minutes of Exercise per Session: Not on file  Stress: No Stress Concern Present (08/31/2021)    [place]     Feeling of Stress : Only a little  Social Connections: Socially Integrated (08/31/2021)    Social Connection and Isolation Panel [NHANES]     Frequency of Communication with Friends and Family:  More than three times a week     Frequency of Social Gatherings with Friends and Family: More than three times a week     Attends Religious Services: More than 4 times per year     Active Member of [employer] or Organizations: Yes     Attends Music therapist: More than 4 times per year     Marital Status: Married      Family History: The patient's family history includes Cancer in her brother and mother; Dementia in her maternal aunt and mother; Diabetes in her brother and mother; Hyperlipidemia in her father and mother; Hypertension in her sister; Kidney cancer in her son; Lung cancer in her father; Pulmonary embolism in her mother; Stroke in her paternal grandfather and paternal grandmother. There is no history of Colon cancer or Esophageal cancer.   ROS:   Please see the history of present illness.    All other systems reviewed and are negative.   EKGs/Labs/Other Studies Reviewed:     The following studies were reviewed today:   EKG on December 19 shows atrial fibrillation EKG on December 18 shows regular, narrow complex tachycardia.  SVT versus a flutter.   Loop recorder interrogations recently have shown a 20% atrial fibrillation burden.       December 17, 2016 A-fib ablation by Dr. Rayann Heman.  Pulmonary vein isolation plus CTI ablation plus posterior box lesion set.  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   EKG:  The ekg ordered today demonstrates sinus rhythm. Intervals normal.      Recent Labs: 02/18/2022: ALT 15; Magnesium 2.1;  TSH 3.291 02/19/2022: BUN 21; Creatinine, Ser 0.89; Hemoglobin 10.5; Platelets 336; Potassium 4.5; Sodium 138  Recent Lipid Panel Labs (Brief)          Component Value Date/Time    CHOL 147 10/24/2021 1129    CHOL 147 11/20/2016 0919    TRIG 79.0 10/24/2021 1129    HDL 57.70 10/24/2021 1129    HDL 47 11/20/2016 0919    CHOLHDL 3 10/24/2021 1129    VLDL 15.8 10/24/2021  1129    LDLCALC 73 10/24/2021 1129    LDLCALC 89 11/20/2016 0919    LDLDIRECT 163.1 07/04/2011 0922        Physical Exam:     VS:  BP 156/67 (BP Location: Left Arm, Patient Position: Sitting, Cuff Size: Normal)   Pulse 66   Ht 5' 5"$  (1.651 m)   Wt 182 lb 12.8 oz (82.9 kg)   LMP  (LMP Unknown)   SpO2 98%   BMI 30.42 kg/m         Wt Readings from Last 3 Encounters:  02/20/22 182 lb 12.8 oz (82.9 kg)  02/19/22 170 lb (77.1 kg)  12/19/21 178 lb 2 oz (80.8 kg)      GEN:  Well nourished, well developed in no acute distress HEENT: Normal NECK: No JVD; No carotid bruits LYMPHATICS: No lymphadenopathy CARDIAC: RRR, no murmurs, rubs, gallops RESPIRATORY:  Clear to auscultation without rales, wheezing or rhonchi  ABDOMEN: Soft, non-tender, non-distended MUSCULOSKELETAL:  No edema; No deformity  SKIN: Warm and dry NEUROLOGIC:  Alert and oriented x 3 PSYCHIATRIC:  Normal affect          Assessment ASSESSMENT:     1. Persistent atrial fibrillation (HCC)     PLAN:     In order of problems listed above:   #Persistent atrial fibrillation and atypical atrial flutter Symptomatic.  Had a prior A-fib ablation in 2018 with multiple flutter circuits noted.  On Tikosyn but continues to have symptomatic salvos of arrhythmia. On Eliquis for stroke prophylaxis.   I discussed treatment options including continuing Tikosyn, transitioning to amiodarone versus repeat catheter ablation.  She would like to pursue catheter ablation which I think is very reasonable.  I suspect she is having multiple atypical flutter circuits given her EKGs upon arrival to the ER and given her prior ablation records.   Discussed treatment options today for their AF including antiarrhythmic drug therapy and ablation. Discussed risks, recovery and likelihood of success. Discussed potential need for repeat ablation procedures and antiarrhythmic drugs after an initial ablation. They wish to proceed with scheduling.    Risk, benefits, and alternatives to EP study and radiofrequency ablation for afib were also discussed in detail today. These risks include but are not limited to stroke, bleeding, vascular damage, tamponade, perforation, damage to the esophagus, lungs, and other structures, pulmonary vein stenosis, worsening renal function, and death. The patient understands these risk and wishes to proceed.  We will therefore proceed with catheter ablation at the next available time.  Carto, ICE, anesthesia are requested for the procedure.  Will also obtain CT PV protocol prior to the procedure to exclude LAA thrombus and further evaluate atrial anatomy.  Presents for redo PVI today. Procedure reviewed.     Signed, Hilton Cork. Quentin Ore, MD, [facility], [facility] 04/23/2022 Electrophysiology [facility]

## 2022-04-24 ENCOUNTER — Encounter (HOSPITAL_COMMUNITY): Payer: Self-pay | Admitting: Cardiology

## 2022-04-24 NOTE — Telephone Encounter (Signed)
*  late entry*  Pt was made aware that her procedure was not cancelled.

## 2022-04-29 ENCOUNTER — Telehealth: Payer: Self-pay | Admitting: Internal Medicine

## 2022-04-29 ENCOUNTER — Ambulatory Visit: Payer: Medicare HMO

## 2022-04-29 ENCOUNTER — Other Ambulatory Visit (HOSPITAL_COMMUNITY): Payer: Self-pay

## 2022-04-29 DIAGNOSIS — I4819 Other persistent atrial fibrillation: Secondary | ICD-10-CM

## 2022-04-29 NOTE — Telephone Encounter (Signed)
Patient c/o Palpitations:  High priority if patient c/o lightheadedness, shortness of breath, or chest pain  How long have you had palpitations/irregular HR/ Afib? Are you having the symptoms now? No   Are you currently experiencing lightheadedness, SOB or CP? No   Do you have a history of afib (atrial fibrillation) or irregular heart rhythm?   Have you checked your BP or HR? (document readings if available): hr 88   Are you experiencing any other symptoms? Headache. Pt states she has had an ablation but ever since she has still been in afib with a headache. Please advise.

## 2022-04-29 NOTE — Telephone Encounter (Signed)
Returned the call to the patient. She had an ablation on 2/20. She has been in afib for 2 days with symptoms of fatigue and an occasional headache. Yesterday her heart rate hit 120's but today has been in the 50-60's.  She has been compliant with her Eliquis. She also takes Metoprolol succinate 12.5 mg bid and Tikosyn 250 mcg bid.

## 2022-04-30 LAB — CUP PACEART REMOTE DEVICE CHECK
Date Time Interrogation Session: 20240221232028
Implantable Pulse Generator Implant Date: 20210816

## 2022-04-30 NOTE — Telephone Encounter (Signed)
Spoke with the patient and advised that Dr. Quentin Ore would like for her to be seen by AFIB clinic. I will reach out to them to get her an appointment. Patient agreeable with plan.

## 2022-05-07 ENCOUNTER — Ambulatory Visit: Payer: Self-pay

## 2022-05-07 NOTE — Patient Instructions (Addendum)
Visit Information  Thank you for taking time to visit with me today. Please don't hesitate to contact me if I can be of assistance to you.   Following are the goals we discussed today:  Notify provider for frequent low blood sugars < 70 or blood sugar value in which you become symptomatic Review education article regarding hypoglycemic management Monitor blood sugars at least 2 times per day and record as recommended by provider.  Limit caffeine and sugar which can contribute to elevated heart rate.  Notify provider for increase in atrial fibrillation symptoms.    Our next appointment is by telephone on 06/24/22 at 11 am  Please call the care guide team at [phone] if you need to cancel or reschedule your appointment.   If you are experiencing a Mental Health or [place] or need someone to talk to, please call the Suicide and Crisis Lifeline: 988 call 1-800-273-TALK (toll free, 24 hour hotline)  Patient verbalizes understanding of instructions and care plan provided today and agrees to view in [place]. Active MyChart status and patient understanding of how to access instructions and care plan via MyChart confirmed with patient.     Quinn Plowman RN,BSN,CCM [place] [phone] direct line  Preventing Hypoglycemia Hypoglycemia occurs when the level of sugar (glucose) in the blood is too low. Hypoglycemia can happen in people who do or do not have diabetes (diabetes mellitus). It can develop quickly, and it can be a medical emergency. For most people with diabetes, a blood glucose level below 70 mg/dL (3.9 mmol/L) is considered hypoglycemia. Glucose is a type of sugar that provides the body's main source of energy. Certain hormones (insulin and glucagon) control the level of glucose in the blood. Insulin lowers blood glucose, and glucagon increases blood glucose. Hypoglycemia can result from having too much insulin in the bloodstream, or from not eating  enough food that contains glucose. Your risk for hypoglycemia is higher: If you take insulin or diabetes medicines to help lower your blood glucose or to help your body make more insulin. If you skip or delay a meal or snack. If you are ill. During and after exercise. You can prevent hypoglycemia by working with your health care provider to adjust your meal plan as needed and by taking other precautions. How can hypoglycemia affect me? Mild symptoms Mild hypoglycemia may not cause any symptoms. If you do have symptoms, they may include: Hunger. Sweating and feeling clammy. Dizziness or feeling light-headed. Sleepiness or restless sleep. Nausea. Increased heart rate. Headache. Blurry vision. Mood changes, including irritability or anxiety. Tingling or numbness around the mouth, lips, or tongue. If mild hypoglycemia is not recognized and treated, it can quickly become moderate or severe hypoglycemia. Moderate symptoms Moderate hypoglycemia can cause: Confusion and poor judgment. Behavior changes. Weakness. Irregular heartbeat. A change in coordination. Severe symptoms Severe hypoglycemia is a medical emergency. It can cause: Fainting. Seizures. Loss of consciousness (coma). Death. What nutrition changes can be made? Work with your health care provider or dietitian to make a healthy meal plan that is right for you. Follow your meal plan carefully. Eat meals at regular times. If recommended by your health care provider, have snacks between meals. Donot skip or delay meals or snacks. You can be at risk for hypoglycemia if you are not getting enough carbohydrates. What lifestyle changes can be made?  Work closely with your health care provider to manage your blood glucose. Make sure you know: Your goal blood glucose levels.  How and when to check your blood glucose. The symptoms of hypoglycemia. It is important to treat hypoglycemia right away to keep it from becoming severe. Do  not drink alcohol on an empty stomach. When you are ill, check your blood glucose more often than usual. Make a sick day plan in advance with your health care provider. Follow this plan whenever you cannot eat or drink normally. Always check your blood glucose before, during, and after exercise. How is this treated? This condition can often be treated by immediately eating or drinking something that contains sugar with 15 grams of fast-acting carbohydrate, such as: 4 oz (120 mL) of fruit juice. 4 oz (120 mL) of regular soda (not diet soda). Several pieces of hard candy. Check food labels to find out how many pieces to eat for 15 grams. 1 Tbsp (15 mL) of sugar or honey. 4 glucose tablets. 1 tube of glucose gel. Treating hypoglycemia if you have diabetes If you are alert and able to swallow safely, follow the 15:15 rule: Take 15 grams of a fast-acting carbohydrate. Talk with your health care provider about how much you should take. Fast-acting options include: Glucose tablets (take 4 tablets). Several pieces of hard candy. Check food labels to find out how many pieces to eat for 15 grams. 4 oz (120 mL) of fruit juice. 4 oz (120 mL) of regular soda (not diet soda). 1 Tbsp (15 mL) of sugar or honey. 1 tube of glucose gel. Check your blood glucose 15 minutes after you take the carbohydrate. If the repeat blood glucose level is still at or below 70 mg/dL (3.9 mmol/L), take 15 grams of a carbohydrate again. If your blood glucose level does not increase above 70 mg/dL (3.9 mmol/L) after 3 tries, seek emergency medical care. After your blood glucose level returns to normal, eat a meal or a snack within 1 hour. Treating severe hypoglycemia Severe hypoglycemia is when your blood glucose level is below 54 mg/dL (3 mmol/L). Severe hypoglycemia is a medical emergency. Get medical help right away. If you have severe hypoglycemia and you cannot eat or drink, you may need glucagon. A family member or close  friend should learn how to check your blood glucose and how to give you glucagon. Ask your health care provider if you need to have an emergency glucagon kit available. Severe hypoglycemia may need to be treated in a hospital. The treatment may include getting glucose through an IV. You may also need treatment for the cause of your hypoglycemia. Where to find more information American Diabetes Association: www.diabetes.[employer] of Diabetes and Digestive and Kidney Diseases: [employer] Association of Diabetes Care & Education Specialists: www.diabeteseducator.org Contact a health care provider if: You have problems keeping your blood glucose in your target range. You have frequent episodes of hypoglycemia. Get help right away if: You continue to have hypoglycemia symptoms after eating or drinking something containing glucose. Your blood glucose level is below 54 mg/dL (3 mmol/L). You faint. You have a seizure. These symptoms may represent a serious problem that is an emergency. Do not wait to see if the symptoms will go away. Get medical help right away. Call your local emergency services (911 in the [place] Do not drive yourself to the hospital. Summary Know the symptoms of hypoglycemia and when you are at risk for it, such as during exercise or when you are sick. Check your blood glucose often when you are at risk for hypoglycemia. Hypoglycemia can develop quickly, and it  can be dangerous if it is not treated right away. If you have a history of severe hypoglycemia, make sure your family or a close friend knows how to use your glucagon kit. Make sure you know how to treat hypoglycemia. Keep a fast-acting carbohydrate option available when you may be at risk for hypoglycemia. This information is not intended to replace advice given to you by your health care provider. Make sure you discuss any questions you have with your health care provider. Document Revised: 01/20/2020  Document Reviewed: 01/20/2020 Elsevier Patient Education  [place].

## 2022-05-07 NOTE — Patient Outreach (Signed)
  Care Coordination   Follow Up Visit Note   05/07/2022 Name: [name] MRN: [id number] DOB: [birth year]  [name] is a 77 y.o. year old female who sees Jinny Sanders, MD for primary care. I spoke with  [name] by phone today.  What matters to the patients health and wellness today?  Patient reports having recent ablation on 04/23/22.  She states she still has some flutters and reports provider is aware.  Patient reports having follow up visit with endocrinologist on 04/03/22.  She states she was prescribed Prandin but made a decision to stop the medication because she was having low blood sugars. She reports having a blood sugar in the 200's and it dropping to 79.  She states this has happened a few times.   Patient reports today's fasting blood sugar was 124. She states she is ranging from 118-130.  Patient reports recent up tick of her Hgb A1c.  Currently Hgb A1c up to 7.7 from 6.9.  Patient states she has eaten more higher carb foods but feels some of the increase could be due to stress related to her recent health conditions.    Goals Addressed             This Visit's Progress    Patient Stated:  Managing Health conditions       Interventions Today    Flowsheet Row Most Recent Value  Chronic Disease   Chronic disease during today's visit Diabetes, Atrial Fibrillation (AFib)  General Interventions   General Interventions Discussed/Reviewed General Interventions Reviewed, Labs, Doctor Visits  [Reviewed most recent Hgb A1c and goal. Verbally assessed current treatment plan and patients adherence. Verbally assessed for atrial fibrillation symptoms. Discussed hypoglycemic management.]  Labs Hgb A1c every 3 months  Doctor Visits Discussed/Reviewed --  Jolinda Croak scheduled/ upcoming provider visits.]  Nutrition Interventions   Nutrition Discussed/Reviewed Nutrition Reviewed, Carbohydrate meal planning  [Advised to limit caffeine and sugar.]  Pharmacy Interventions   Pharmacy  Dicussed/Reviewed Pharmacy Topics Reviewed  [medications reviewed and compliance discussed.  Patient advised to notify endocrinologist that she discontinued prescribed Prandin due to frequent low blood sugars.]                  SDOH assessments and interventions completed:  No     Care Coordination Interventions:  Yes, provided   Follow up plan: Follow up call scheduled for 06/23/09    Encounter Outcome:  Pt. Visit Completed   Quinn Plowman RN,BSN,CCM [place] [phone] direct line

## 2022-05-13 NOTE — Progress Notes (Signed)
Carelink Summary Report / Loop Recorder 

## 2022-05-21 ENCOUNTER — Encounter: Payer: Medicare HMO | Admitting: Internal Medicine

## 2022-05-22 ENCOUNTER — Encounter (HOSPITAL_COMMUNITY): Payer: Self-pay | Admitting: Physician Assistant

## 2022-05-22 ENCOUNTER — Ambulatory Visit (HOSPITAL_COMMUNITY)
Admission: RE | Admit: 2022-05-22 | Discharge: 2022-05-22 | Disposition: A | Discharge status: Home / Self Care | Payer: Medicare HMO | Source: Ambulatory Visit | Attending: Physician Assistant | Admitting: Physician Assistant

## 2022-05-22 VITALS — BP 148/70 | HR 58 | Ht 65.0 in | Wt 184.8 lb

## 2022-05-22 DIAGNOSIS — I4819 Other persistent atrial fibrillation: Secondary | ICD-10-CM

## 2022-05-22 DIAGNOSIS — D6869 Other thrombophilia: Secondary | ICD-10-CM | POA: Diagnosis not present

## 2022-05-22 DIAGNOSIS — G4733 Obstructive sleep apnea (adult) (pediatric): Secondary | ICD-10-CM | POA: Diagnosis not present

## 2022-05-22 DIAGNOSIS — I10 Essential (primary) hypertension: Secondary | ICD-10-CM | POA: Diagnosis not present

## 2022-05-22 NOTE — Progress Notes (Signed)
Primary Care Physician: Jinny Sanders, MD Primary Cardiologist: Dr Fletcher Anon Primary Electrophysiologist: Dr Klein/Dr Quentin Ore Referring Physician: Dr Rayann Heman   [name] is a 77 y.o. female with a history of anemia, DM, HTN, hypothyroidism, OSA, MR, atrial fibrillation who presents for follow up in the [facility]. Patient is on Eliquis for a CHADS2VASC score of 4. She was seen at the ED 11/21/20 for afib with RVR and underwent DCCV at that time. Patient is s/p dofetilide admission 3/7-3/10/23. She continued to have symptomatic episodes of afib and atrial flutter and underwent afib and flutter ablation with Dr Quentin Ore on 04/23/22.  On follow up today, patient has continued to have episodes of afib since her ablation. ILR shows 14% afib burden. She has symptoms of fatigue with exertion when out of rhythm. Patient thinks her symptoms may be mildly improved. She denies chest pain, swallowing pain, or groin issues.   Today, she denies symptoms of palpitations, chest pain, shortness of breath, orthopnea, PND, lower extremity edema, presyncope, syncope, bleeding, or neurologic sequela. The patient is tolerating medications without difficulties and is otherwise without complaint today.    Atrial Fibrillation Risk Factors:  she does have symptoms or diagnosis of sleep apnea. she is compliant with CPAP therapy. she does not have a history of rheumatic fever.   she has a BMI of Body mass index is 30.75 kg/m.[name] Filed Weights   05/22/22 1449  Weight: 83.8 kg    Family History  Problem Relation Age of Onset   Cancer Mother        fallopian tube   Dementia Mother    Pulmonary embolism Mother    Hyperlipidemia Mother    Diabetes Mother    Hyperlipidemia Father    Lung cancer Father    Hypertension Sister    Diabetes Brother    Cancer Brother    Stroke Paternal Grandmother    Stroke Paternal Grandfather    Kidney cancer Son    Dementia Maternal Aunt    Colon  cancer Neg Hx    Esophageal cancer Neg Hx      Atrial Fibrillation Management history:  Previous antiarrhythmic drugs: flecainide, dofetilide  Previous cardioversions: 2018 x 2, 2020, 2021 x 2, 11/21/20, 03/24/21 Previous ablations: 12/17/16, 04/23/22 CHADS2VASC score: 4 Anticoagulation history: Eliquis   Past Medical History:  Diagnosis Date   Anemia    Anxiety    Atrial fibrillation (HCC)    Colon polyps    Constipation    Depressive disorder, not elsewhere classified    Diabetes mellitus without complication ([place])    Diaphragmatic hernia without mention of obstruction or gangrene    Disorders of bursae and tendons in shoulder region, unspecified    Diverticulosis of colon (without mention of hemorrhage)    External hemorrhoid    Fundic gland polyps of stomach, benign    GERD (gastroesophageal reflux disease)    History of hiatal hernia    HTN (hypertension)    Hypertension    Hypothyroidism    Internal hemorrhoids without mention of complication    Moderate mitral regurgitation    Obesity, unspecified    OSA on CPAP    compliant with CPAP   Other urinary incontinence    Persistent atrial fibrillation ([place])    Pure hypercholesterolemia    Type II diabetes mellitus ([place])    Urinary frequency    Urinary urgency    Vitamin B 12 deficiency    Past Surgical History:  Procedure  Laterality Date   ABDOMINAL HYSTERECTOMY     ABDOMINAL HYSTERECTOMY  1972   Have both ovaries   ATRIAL FIBRILLATION ABLATION  12/17/2016   ATRIAL FIBRILLATION ABLATION N/A 12/17/2016   Procedure: ATRIAL FIBRILLATION ABLATION;  Surgeon: Thompson Grayer, MD;  Location: [facility];  Service: Cardiovascular;  Laterality: N/A;   ATRIAL FIBRILLATION ABLATION N/A 04/23/2022   Procedure: ATRIAL FIBRILLATION ABLATION;  Surgeon: Vickie Epley, MD;  Location: [facility];  Service: Cardiovascular;  Laterality: N/A;   [place]   [facility]; "Dr. [name]"   CARDIOVERSION N/A  05/27/2016   Procedure: CARDIOVERSION;  Surgeon: [name], MD;  Location: [facility];  Service: Cardiovascular;  Laterality: N/A;   CARDIOVERSION N/A 11/25/2016   Procedure: CARDIOVERSION;  Surgeon: [name], MD;  Location: [facility];  Service: Cardiovascular;  Laterality: N/A;   CARDIOVERSION N/A 09/18/2018   Procedure: CARDIOVERSION;  Surgeon: [name], MD;  Location: [facility];  Service: Cardiovascular;  Laterality: N/A;   CARDIOVERSION N/A 07/28/2019   Procedure: CARDIOVERSION;  Surgeon: Kate Sable, MD;  Location: [facility];  Service: Cardiovascular;  Laterality: N/A;   CARDIOVERSION N/A 09/07/2019   Procedure: CARDIOVERSION;  Surgeon: Nelva Bush, MD;  Location: [facility];  Service: Cardiovascular;  Laterality: N/A;   CARDIOVERSION N/A 02/19/2022   Procedure: CARDIOVERSION;  Surgeon: Nelva Bush, MD;  Location: [facility];  Service: Cardiovascular;  Laterality: N/A;   CATARACT EXTRACTION W/ INTRAOCULAR LENS  IMPLANT, BILATERAL Bilateral    COLONOSCOPY     ELECTROPHYSIOLOGIC STUDY N/A 01/24/2016   Procedure: CARDIOVERSION;  Surgeon: [name], MD;  Location: [facility];  Service: Cardiovascular;  Laterality: N/A;   implantable loop recorder implantation  10/18/2019   Medtronic Reveal [place] model [id number]) implantable loop recorder    LAPAROSCOPIC CHOLECYSTECTOMY     REDUCTION MAMMAPLASTY Bilateral    TONSILLECTOMY     TOTAL HIP ARTHROPLASTY Right 05/12/2018   Procedure: TOTAL HIP ARTHROPLASTY ANTERIOR APPROACH;  Surgeon: Paralee Cancel, MD;  Location: [facility];  Service: Orthopedics;  Laterality: Right;  70 mins    Current Outpatient Medications  Medication Sig Dispense Refill   apixaban (ELIQUIS) 5 MG TABS tablet TAKE ONE TABLET BY MOUTH TWICE DAILY 60 tablet 6   calcium carbonate (OS-CAL - DOSED IN MG OF ELEMENTAL CALCIUM) 1250 (500 Ca) MG tablet Take 1 tablet by mouth 2 (two) times a week.     dapagliflozin propanediol (FARXIGA) 5 MG TABS  tablet Take 1 tablet (5 mg total) by mouth daily before breakfast. 90 tablet 3   dofetilide (TIKOSYN) 250 MCG capsule TAKE 1 CAPSULE BY MOUTH TWO TIMES DAILY 60 capsule 6   doxycycline (VIBRA-TABS) 100 MG tablet Take 100 mg by mouth every evening.     hydroxypropyl methylcellulose / hypromellose (ISOPTO TEARS / GONIOVISC) 2.5 % ophthalmic solution Place 1 drop into both eyes 2 (two) times daily as needed for dry eyes.     linagliptin (TRADJENTA) 5 MG TABS tablet TAKE 1 TABLET BY MOUTH DAILY 90 tablet 1   magnesium oxide (MAG-OX) 400 MG tablet Take 1 tablet (400 mg total) by mouth 2 (two) times daily. 60 tablet 6   metFORMIN (GLUCOPHAGE-XR) 500 MG 24 hr tablet TAKE TWO TABLETS BY MOUTH TWICE DAILY WITH MEALS 360 tablet 0   metoprolol succinate (TOPROL-XL) 25 MG 24 hr tablet Take one tablet by mouth at bedtime (Patient taking differently: Take 12.5 mg by mouth in the morning and at bedtime.) 90 tablet  3   MYRBETRIQ 50 MG TB24 tablet Take 50 mg by mouth every evening.     neomycin-polymyxin b-dexamethasone (MAXITROL) 3.5-10000-0.1 SUSP Place 1 drop into both eyes in the morning and at bedtime.     ONETOUCH ULTRA test strip CHECK BLOOD SUGAR 2 TIMES A DAY AS DIRECTED DX CODE E11.9 100 strip 7   pantoprazole (PROTONIX) 40 MG tablet Take 1 tablet (40 mg total) by mouth daily. 45 tablet 0   repaglinide (PRANDIN) 1 MG tablet Take 1 tablet (1 mg total) by mouth 2 (two) times daily before a meal. 180 tablet 3   sertraline (ZOLOFT) 100 MG tablet Take 1 tablet (100 mg total) by mouth at bedtime. 30 tablet 5   tiZANidine (ZANAFLEX) 4 MG tablet Take 4 mg by mouth at bedtime.     zolpidem (AMBIEN) 10 MG tablet Take 5 mg by mouth at bedtime.     colchicine 0.6 MG tablet Take 1 tablet (0.6 mg total) by mouth 2 (two) times daily for 5 days. 10 tablet 0   No current facility-administered medications for this encounter.    Allergies  Allergen Reactions   Cortisone Shortness Of Breath and Rash   Pravachol  [Pravastatin Sodium] Other (See Comments)    Leg pain    Contrast Media [Iodinated Contrast Media] Rash    Social History   Socioeconomic History   Marital status: Married    Spouse name: Jimmy   Number of children: 2   Years of education: Not on file   Highest education level: Not on file  Occupational History   Occupation: retired  Tobacco Use   Smoking status: Never   Smokeless tobacco: Never   Tobacco comments:    Never smoke 08/30/21  Vaping Use   Vaping Use: Never used  Substance and Sexual Activity   Alcohol use: Not Currently    Comment: a little bit of wine at bedtime to help sleep   Drug use: No   Sexual activity: Yes  Other Topics Concern   Not on file  Social History Narrative   ** Merged History Encounter **       Exercise walks 3 times daily Lives with spouse in [place] - retired from employment 1 son and 1 daughter  Diet fruit, occ fast food, salads and lean meat  3 tea/day, no EtOH, no tobacco, no drugs    Social Determinants of Health   Financial Resource Strain: Low Risk  (08/31/2021)   Overall Financial Resource Strain (CARDIA)    Difficulty of Paying Living Expenses: Not hard at all  Food Insecurity: No Food Insecurity (08/31/2021)   Hunger Vital Sign    Worried About Running Out of Food in the Last Year: Never true    [place] in the Last Year: Never true  Transportation Needs: No Transportation Needs (08/31/2021)   PRAPARE - Hydrologist (Medical): No    Lack of Transportation (Non-Medical): No  Physical Activity: Unknown (08/31/2021)   Exercise Vital Sign    Days of Exercise per Week: 3 days    Minutes of Exercise per Session: Not on file  Stress: No Stress Concern Present (08/31/2021)   [place]    Feeling of Stress : Only a little  Social Connections: Socially Integrated (08/31/2021)   Social Connection and Isolation  Panel [NHANES]    Frequency of Communication with Friends and Family: More than three times a week  Frequency of Social Gatherings with Friends and Family: More than three times a week    Attends Religious Services: More than 4 times per year    Active Member of [employer] or Organizations: Yes    Attends Music therapist: More than 4 times per year    Marital Status: Married  Human resources officer Violence: Not At Risk (08/31/2021)   Humiliation, Afraid, Rape, and Kick questionnaire    Fear of Current or Ex-Partner: No    Emotionally Abused: No    Physically Abused: No    Sexually Abused: No     ROS- All systems are reviewed and negative except as per the HPI above.  Physical Exam: Vitals:   05/22/22 1449  BP: (!) 148/70  Pulse: (!) 58  Weight: 83.8 kg  Height: 5\' 5"  (1.651 m)    GEN- The patient is a well appearing elderly female, alert and oriented x 3 today.   HEENT-head normocephalic, atraumatic, sclera clear, conjunctiva pink, hearing intact, trachea midline. Lungs- Clear to ausculation bilaterally, normal work of breathing Heart- Regular rate and rhythm, no murmurs, rubs or gallops  GI- soft, NT, ND, + BS Extremities- no clubbing, cyanosis, or edema MS- no significant deformity or atrophy Skin- no rash or lesion Psych- euthymic mood, full affect Neuro- strength and sensation are intact   Wt Readings from Last 3 Encounters:  05/22/22 83.8 kg  04/23/22 81.2 kg  04/03/22 83.1 kg    EKG today demonstrates  SB, PAC Vent. rate 58 BPM PR interval 172 ms QRS duration 82 ms QT/QTcB 420/412 ms  Echo 01/03/22 demonstrated   1. Left ventricular ejection fraction, by estimation, is 55 to 60%. The  left ventricle has normal function. The left ventricle has no regional  wall motion abnormalities. Left ventricular diastolic parameters are  consistent with Grade II diastolic dysfunction (pseudonormalization).   2. Right ventricular systolic function is normal. The  right ventricular  size is normal. There is normal pulmonary artery systolic pressure. The  estimated right ventricular systolic pressure is [id number] mmHg.   3. Left atrial size was severely dilated.   4. The mitral valve is normal in structure. Mild mitral valve  regurgitation. No evidence of mitral stenosis.   5. The aortic valve is tricuspid. Aortic valve regurgitation is not  visualized. No aortic stenosis is present.   6. The inferior vena cava is normal in size with greater than 50%  respiratory variability, suggesting right atrial pressure of 3 mmHg.   Comparison(s): 2/22-EF 55-60%, MR.   Epic records are reviewed at length today  CHA2DS2-VASc Score = 4  The patient's score is based upon: CHF History: 0 HTN History: 1 Diabetes History: 1 Stroke History: 0 Vascular Disease History: 0 Age Score: 1 Gender Score: 1       ASSESSMENT AND PLAN: 1. Persistent Atrial Fibrillation (ICD10:  I48.19) The patient's CHA2DS2-VASc score is 4, indicating a 4.8% annual risk of stroke.   S/p dofetilide admission 3/7-3/10/23 S/p repeat afib and flutter ablation 04/23/22 In SR today but ILR shows 14% afib burden  Continue dofetilide 250 mcg BID. QT stable. We discussed that she is still in the blanking period for 3 months post ablation. Hopefully, her afib burden improves as she continues to heal. Her husband is frustrated by her lack of improvement. They inquire about AV node ablation and convergent today. Education provided, they will discuss options further with Dr Quentin Ore at their visit in May.  Continue Eliquis 5 mg BID Continue Toprol  12.5 mg daily  2. Secondary Hypercoagulable State (ICD10:  D68.69) The patient is at significant risk for stroke/thromboembolism based upon her CHA2DS2-VASc Score of 4.  Continue Apixaban (Eliquis).   3. OSA Encouraged compliance with CPAP therapy.   4. HTN Stable, no changes today.   5. MR Mild on echo 01/03/22   Follow up with Dr Quentin Ore as  scheduled.    [facility] [address] [place], [place] [address] [phone]

## 2022-05-27 ENCOUNTER — Other Ambulatory Visit (HOSPITAL_BASED_OUTPATIENT_CLINIC_OR_DEPARTMENT_OTHER): Payer: Medicare HMO

## 2022-06-03 ENCOUNTER — Ambulatory Visit (INDEPENDENT_AMBULATORY_CARE_PROVIDER_SITE_OTHER): Payer: Medicare HMO

## 2022-06-03 DIAGNOSIS — I4819 Other persistent atrial fibrillation: Secondary | ICD-10-CM | POA: Diagnosis not present

## 2022-06-04 LAB — CUP PACEART REMOTE DEVICE CHECK
Date Time Interrogation Session: 20240402134055
Implantable Pulse Generator Implant Date: 20210816

## 2022-06-05 ENCOUNTER — Other Ambulatory Visit: Payer: Self-pay | Admitting: Family Medicine

## 2022-06-10 NOTE — Progress Notes (Signed)
Carelink Summary Report / Loop Recorder 

## 2022-06-21 DIAGNOSIS — K5900 Constipation, unspecified: Secondary | ICD-10-CM | POA: Diagnosis not present

## 2022-06-21 DIAGNOSIS — J0120 Acute ethmoidal sinusitis, unspecified: Secondary | ICD-10-CM | POA: Diagnosis not present

## 2022-06-24 ENCOUNTER — Other Ambulatory Visit: Payer: Self-pay | Admitting: Cardiovascular Disease

## 2022-06-24 ENCOUNTER — Ambulatory Visit: Payer: Self-pay

## 2022-06-24 DIAGNOSIS — I4819 Other persistent atrial fibrillation: Secondary | ICD-10-CM

## 2022-06-24 NOTE — Telephone Encounter (Signed)
Refill request

## 2022-06-24 NOTE — Telephone Encounter (Signed)
Prescription refill request for Eliquis received. Indication: AF Last office visit: 05/22/22  C Fenton PA Scr: 1.09 on 04/17/22 Age: 77 Weight: 83.8kg  Based on above findings Eliquis  twice daily is the appropriate dose.  Refill approved.

## 2022-06-24 NOTE — Patient Outreach (Signed)
  Care Coordination   Follow Up Visit Note   06/24/2022 Name: [name] MRN: [id number] DOB: 01/02/1946  [name] is a 77 y.o. year old female who sees [name], MD for primary care. I spoke with  [name] by phone today.  What matters to the patients health and wellness today?  Patient states she is still having some problems with break through increase heart rate.  Patient states her pulse can range anywhere from 50's to 102.  Patient reports fasting blood sugar today was 145 and yesterday was 114.  She states her blood sugars have not ranged in the 200's since last telephone outreach with [facility].  Patient reports having rapid blood sugar drops when she takes Repaglinide.  She states she may be out at the store and starts feeling bad due to hypoglycemic event. Patient states next follow up appointment with her endocrinologist is 08/26/22.   Patient states she is unable to get her should surgery until she sees and gets cleared by her cardiologist.  She states she would like her appointment to be sooner than currently scheduled.     Goals Addressed             This Visit's Progress    Patient Stated:  Managing Health conditions       Interventions Today    Flowsheet Row Most Recent Value  Chronic Disease   Chronic disease during today's visit Diabetes, Atrial Fibrillation (AFib)  General Interventions   General Interventions Discussed/Reviewed General Interventions Reviewed, Doctor Visits  [Evaluation of current treatment plan for diabetes/ A-fib and patients adherence to plan as established by provider. Assessed blood sugar readings. Assessed for A-fib symptoms.]  Doctor Visits Discussed/Reviewed Doctor Visits Reviewed  [facility] scheduled/ upcoming provider appointments. Advised to call cardiology office and ask to be put on waiting list for sooner appointment. Advised to send provider MyChart message informing her or rapid blood sugar drops when taking Repaglinide.]   Education Interventions   Education Provided Provided Education  [Discussed hypoglycemic management.]  Provided Verbal Education On Other  [Discussed need to keep candy/ glucose tabs on person at all times to treat hypoglycemic events.]  Nutrition Interventions   Nutrition Discussed/Reviewed Nutrition Reviewed, Adding fruits and vegetables  [Advised to drink plenty of water when eating foods high in fiber to avoid constipation. Advised to increase fresh fruits / vegetables in diet.]  Pharmacy Interventions   Pharmacy Dicussed/Reviewed Pharmacy Topics Reviewed  [Reviewed medications and discussed compliance.]                  SDOH assessments and interventions completed:  No     Care Coordination Interventions:  Yes, provided   Follow up plan: Follow up call scheduled for 08/02/22    Encounter Outcome:  Pt. Visit Completed   George Ina RN,BSN,CCM [facility] Care Coordination [phone] direct line

## 2022-06-27 ENCOUNTER — Ambulatory Visit (INDEPENDENT_AMBULATORY_CARE_PROVIDER_SITE_OTHER): Payer: Medicare HMO | Admitting: Family Medicine

## 2022-06-27 ENCOUNTER — Other Ambulatory Visit: Payer: Self-pay | Admitting: Family Medicine

## 2022-06-27 ENCOUNTER — Encounter: Payer: Self-pay | Admitting: Family Medicine

## 2022-06-27 VITALS — BP 90/60 | HR 68 | Temp 97.9°F | Ht 65.0 in | Wt 184.0 lb

## 2022-06-27 DIAGNOSIS — K853 Drug induced acute pancreatitis without necrosis or infection: Secondary | ICD-10-CM

## 2022-06-27 DIAGNOSIS — R1011 Right upper quadrant pain: Secondary | ICD-10-CM | POA: Insufficient documentation

## 2022-06-27 DIAGNOSIS — K5900 Constipation, unspecified: Secondary | ICD-10-CM | POA: Diagnosis not present

## 2022-06-27 LAB — CBC WITH DIFFERENTIAL/PLATELET
Basophils Absolute: 0 10*3/uL (ref 0.0–0.1)
Basophils Relative: 0.4 % (ref 0.0–3.0)
Eosinophils Absolute: 0.1 10*3/uL (ref 0.0–0.7)
Eosinophils Relative: 0.9 % (ref 0.0–5.0)
HCT: 31.9 % — ABNORMAL LOW (ref 36.0–46.0)
Hemoglobin: 10.6 g/dL — ABNORMAL LOW (ref 12.0–15.0)
Lymphocytes Relative: 24.9 % (ref 12.0–46.0)
Lymphs Abs: 1.4 10*3/uL (ref 0.7–4.0)
MCHC: 33.3 g/dL (ref 30.0–36.0)
MCV: 84 fl (ref 78.0–100.0)
Monocytes Absolute: 0.4 10*3/uL (ref 0.1–1.0)
Monocytes Relative: 7 % (ref 3.0–12.0)
Neutro Abs: 3.7 10*3/uL (ref 1.4–7.7)
Neutrophils Relative %: 66.8 % (ref 43.0–77.0)
Platelets: 312 10*3/uL (ref 150.0–400.0)
RBC: 3.8 Mil/uL — ABNORMAL LOW (ref 3.87–5.11)
RDW: 15.6 % — ABNORMAL HIGH (ref 11.5–15.5)
WBC: 5.6 10*3/uL (ref 4.0–10.5)

## 2022-06-27 LAB — COMPREHENSIVE METABOLIC PANEL
ALT: 11 U/L (ref 0–35)
AST: 14 U/L (ref 0–37)
Albumin: 4.1 g/dL (ref 3.5–5.2)
Alkaline Phosphatase: 73 U/L (ref 39–117)
BUN: 17 mg/dL (ref 6–23)
CO2: 27 mEq/L (ref 19–32)
Calcium: 9.3 mg/dL (ref 8.4–10.5)
Chloride: 100 mEq/L (ref 96–112)
Creatinine, Ser: 1.09 mg/dL (ref 0.40–1.20)
GFR: 49.78 mL/min — ABNORMAL LOW (ref >60.00)
Glucose, Bld: 171 mg/dL — ABNORMAL HIGH (ref 70–99)
Potassium: 4.7 mEq/L (ref 3.5–5.1)
Sodium: 134 mEq/L — ABNORMAL LOW (ref 135–145)
Total Bilirubin: 0.3 mg/dL (ref 0.2–1.2)
Total Protein: 6.8 g/dL (ref 6.0–8.3)

## 2022-06-27 LAB — TSH: TSH: 2.23 u[IU]/mL (ref 0.35–5.50)

## 2022-06-27 LAB — LIPASE: Lipase: 209 U/L — ABNORMAL HIGH (ref 11.0–59.0)

## 2022-06-27 MED ORDER — TIZANIDINE HCL 4 MG PO TABS: 4.0000 mg | ORAL_TABLET | Freq: Every day | ORAL | 0 refills | Status: DC

## 2022-06-27 MED ORDER — PREDNISONE 50 MG PO TABS: ORAL_TABLET | ORAL | 0 refills | Status: DC

## 2022-06-27 NOTE — Assessment & Plan Note (Signed)
Acute, most likely related to constipation. Status post cholecystectomy.  Will evaluate with labs including liver function tests, lipase and CBC. If not improving with treatment of constipation, consider further imaging with right upper quadrant ultrasound

## 2022-06-27 NOTE — Progress Notes (Signed)
Patient ID: [name], female    DOB: 04-21-1945, 77 y.o.   MRN: [id number]  This visit was conducted in person.  BP 90/60   Pulse 68   Temp 97.9 F (36.6 C) (Temporal)   Ht  (1.651 m)   Wt 184 lb (83.5 kg)   LMP  (LMP Unknown)   SpO2 98%   BMI 30.62 kg/m    CC:  Chief Complaint  Patient presents with   Abdominal Pain    X 1 week   Constipation    Started Miralax on Saturday   RLQ pain   Nausea    Before and after eating    Subjective:   HPI: [name] is a 77 y.o. female MDD, atrial fibrillation,  DM, GERD presenting on 06/27/2022 for Abdominal Pain (X 1 week), Constipation (Started Miralax on Saturday), RLQ pain, and Nausea (Before and after eating)   She reports new onset 2-3 week ago.. constipation.  1 week ago noted early satiety, abdominal pain epigastric and RUQ, nausea.  Pain constant but worse after eating.   Started miralax 17 gm ( did you three times in 1 day!)l and fiber.[name]  after 3 days.. cause watery loose stools on 4/22... symptoms of pain felt slightly better but still pain.  She still feels urge to go to the bathroom.   No fever, no vomiting   Colonoscopy [facility] 2019: tics, polyps     Relevant past medical, surgical, family and social history reviewed and updated as indicated. Interim medical history since our last visit reviewed. Allergies and medications reviewed and updated. Outpatient Medications Prior to Visit  Medication Sig Dispense Refill   apixaban (ELIQUIS) 5 MG TABS tablet TAKE ONE TABLET BY MOUTH TWICE DAILY 60 tablet 5   calcium carbonate (OS-CAL - DOSED IN MG OF ELEMENTAL CALCIUM) 1250 (500 Ca) MG tablet Take 1 tablet by mouth 2 (two) times a week.     colchicine 0.6 MG tablet Take 1 tablet (0.6 mg total) by mouth 2 (two) times daily for 5 days. 10 tablet 0   dapagliflozin propanediol (FARXIGA) 5 MG TABS tablet Take 1 tablet (5 mg total) by mouth daily before breakfast. 90 tablet 3   dofetilide (TIKOSYN) 250 MCG  capsule TAKE 1 CAPSULE BY MOUTH TWO TIMES DAILY 60 capsule 6   doxycycline (VIBRA-TABS) 100 MG tablet Take 100 mg by mouth every evening.     hydroxypropyl methylcellulose / hypromellose (ISOPTO TEARS / GONIOVISC) 2.5 % ophthalmic solution Place 1 drop into both eyes 2 (two) times daily as needed for dry eyes.     linagliptin (TRADJENTA) 5 MG TABS tablet TAKE 1 TABLET BY MOUTH DAILY 90 tablet 1   magnesium oxide (MAG-OX) 400 MG tablet Take 1 tablet (400 mg total) by mouth 2 (two) times daily. 60 tablet 6   metFORMIN (GLUCOPHAGE-XR) 500 MG 24 hr tablet TAKE TWO TABLETS BY MOUTH TWICE DAILY WITH MEALS 360 tablet 0   metoprolol succinate (TOPROL-XL) 25 MG 24 hr tablet Take one tablet by mouth at bedtime (Patient taking differently: Take 12.5 mg by mouth in the morning and at bedtime.) 90 tablet 3   MYRBETRIQ 50 MG TB24 tablet Take 50 mg by mouth every evening.     neomycin-polymyxin b-dexamethasone (MAXITROL) 3.5-10000-0.1 SUSP Place 1 drop into both eyes in the morning and at bedtime.     ONETOUCH ULTRA test strip CHECK BLOOD SUGAR 2 TIMES A DAY AS DIRECTED DX CODE E11.9 100 strip 7  pantoprazole (PROTONIX) 40 MG tablet Take 1 tablet (40 mg total) by mouth daily. 45 tablet 0   repaglinide (PRANDIN) 1 MG tablet Take 1 tablet (1 mg total) by mouth 2 (two) times daily before a meal. 180 tablet 3   sertraline (ZOLOFT) 100 MG tablet TAKE 1 TABLET BY MOUTH NIGHTLY 30 tablet 5   zolpidem (AMBIEN) 10 MG tablet Take 5 mg by mouth at bedtime.     tiZANidine (ZANAFLEX) 4 MG tablet Take 4 mg by mouth at bedtime.     No facility-administered medications prior to visit.     Per HPI unless specifically indicated in ROS section below Review of Systems  Constitutional:  Negative for fatigue and fever.  HENT:  Negative for congestion.   Eyes:  Negative for pain.  Respiratory:  Negative for cough and shortness of breath.   Cardiovascular:  Negative for chest pain, palpitations and leg swelling.   Gastrointestinal:  Negative for abdominal pain.  Genitourinary:  Negative for dysuria and vaginal bleeding.  Musculoskeletal:  Negative for back pain.  Neurological:  Negative for syncope, light-headedness and headaches.  Psychiatric/Behavioral:  Negative for dysphoric mood.    Objective:  BP 90/60   Pulse 68   Temp 97.9 F (36.6 C) (Temporal)   Ht 5\' 5"  (1.651 m)   Wt 184 lb (83.5 kg)   LMP  (LMP Unknown)   SpO2 98%   BMI 30.62 kg/m   Wt Readings from Last 3 Encounters:  06/27/22 184 lb (83.5 kg)  05/22/22 184 lb 12.8 oz (83.8 kg)  04/23/22 179 lb (81.2 kg)      Physical Exam Constitutional:      General: She is not in acute distress.    Appearance: Normal appearance. She is well-developed. She is not ill-appearing or toxic-appearing.  HENT:     Head: Normocephalic.     Right Ear: Hearing, tympanic membrane, ear canal and external ear normal. Tympanic membrane is not erythematous, retracted or bulging.     Left Ear: Hearing, tympanic membrane, ear canal and external ear normal. Tympanic membrane is not erythematous, retracted or bulging.     Nose: No mucosal edema or rhinorrhea.     Right Sinus: No maxillary sinus tenderness or frontal sinus tenderness.     Left Sinus: No maxillary sinus tenderness or frontal sinus tenderness.     Mouth/Throat:     Pharynx: Uvula midline.  Eyes:     General: Lids are normal. Lids are everted, no foreign bodies appreciated.     Conjunctiva/sclera: Conjunctivae normal.     Pupils: Pupils are equal, round, and reactive to light.  Neck:     Thyroid: No thyroid mass or thyromegaly.     Vascular: No carotid bruit.     Trachea: Trachea normal.  Cardiovascular:     Rate and Rhythm: Normal rate and regular rhythm.     Pulses: Normal pulses.     Heart sounds: Normal heart sounds, S1 normal and S2 normal. No murmur heard.    No friction rub. No gallop.  Pulmonary:     Effort: Pulmonary effort is normal. No tachypnea or respiratory distress.      Breath sounds: Normal breath sounds. No decreased breath sounds, wheezing, rhonchi or rales.  Abdominal:     General: Bowel sounds are normal.     Palpations: Abdomen is soft.     Tenderness: There is abdominal tenderness in the right upper quadrant. There is no guarding or rebound.  Genitourinary:  Rectum: Normal. Guaiac result negative. No mass, tenderness, anal fissure or external hemorrhoid. Normal anal tone.     Comments:  No rectal impaction Musculoskeletal:     Cervical back: Normal range of motion and neck supple.  Skin:    General: Skin is warm and dry.     Findings: No rash.  Neurological:     Mental Status: She is alert.  Psychiatric:        Mood and Affect: Mood is not anxious or depressed.        Speech: Speech normal.        Behavior: Behavior normal. Behavior is cooperative.        Thought Content: Thought content normal.        Judgment: Judgment normal.       Results for orders placed or performed in visit on 06/03/22  CUP PACEART REMOTE DEVICE CHECK  Result Value Ref Range   Pulse Generator Manufacturer MERM    Date Time Interrogation Session [id number]    Pulse Gen Model LNQ22 LINQ II    Pulse Gen Serial Number [id number]    Clinic Name [facility]    Implantable Pulse Generator Type ICM/ILR    Implantable Pulse Generator Implant Date [id number]    Eval Rhythm SR at 70 bpm/PAC     Assessment and Plan  RUQ pain Assessment & Plan: Acute, most likely related to constipation. Status post cholecystectomy.  Will evaluate with labs including liver function tests, lipase and CBC. If not improving with treatment of constipation, consider further imaging with right upper quadrant ultrasound  Orders: -     CBC with Differential/Platelet -     Lipase -     Comprehensive metabolic panel  Acute constipation Assessment & Plan: Acute, given constipation proceeded abdominal pain, I feel most likely constipation as the cause of the abdominal pain. She  has had some issues with constipation in the past but had been fairly regular recently.  Will check thyroid function to make sure there is no new change causing worsening constipation. Reviewed colonoscopy from 2019. Rectal exam showed no rectal masses or impaction.  Negative Hemoccult.  Encouraged her to not take quite so much MiraLAX and instead just use 17 g p.o. daily.  Some of her symptoms may be related to high-dose MiraLAX along with new use of fiber and limited water. Encouraged her to push fluids.  Orders: -     TSH  Other orders -     tiZANidine HCl; Take 1 tablet (4 mg total) by mouth at bedtime.  Dispense: 30 tablet; Refill: 0    No follow-ups on file.   Kerby Nora, MD

## 2022-06-27 NOTE — Assessment & Plan Note (Addendum)
Acute, given constipation proceeded abdominal pain, I feel most likely constipation as the cause of the abdominal pain. She has had some issues with constipation in the past but had been fairly regular recently.  Will check thyroid function to make sure there is no new change causing worsening constipation. Reviewed colonoscopy from 2019. Rectal exam showed no rectal masses or impaction.  Negative Hemoccult.  Encouraged her to not take quite so much MiraLAX and instead just use 17 g p.o. daily.  Some of her symptoms may be related to high-dose MiraLAX along with new use of fiber and limited water. Encouraged her to push fluids.

## 2022-06-27 NOTE — Patient Instructions (Addendum)
Please stop at the lab to have labs drawn.   Increase water, continue Miralax 17 gms once daily.   Go to ER if severe abdominal pain.

## 2022-06-28 ENCOUNTER — Telehealth: Payer: Self-pay | Admitting: *Deleted

## 2022-06-28 NOTE — Telephone Encounter (Signed)
Patient called back in and relayed message below. Patient stated she understood. Thank you!

## 2022-06-28 NOTE — Telephone Encounter (Addendum)
Per Dr. Ermalene Searing:   Since we weren't able to get patient scheduled for her CT until Monday due to contrast allergy, if she has severe abdominal pain over the weekend, she needs to go to the ER.  She needs to continue clear liquids until the CT or until pain is better.  She also needs to stop the Tradjenta as this could be causing the possible pancreatitis.    I have left message for Mrs. Lukens to return my call to relay this message to her from Dr. Ermalene Searing.

## 2022-07-01 ENCOUNTER — Ambulatory Visit
Admission: RE | Admit: 2022-07-01 | Discharge: 2022-07-01 | Disposition: A | Discharge status: Home / Self Care | Payer: Medicare HMO | Source: Ambulatory Visit | Attending: Family Medicine | Admitting: Family Medicine

## 2022-07-01 DIAGNOSIS — K853 Drug induced acute pancreatitis without necrosis or infection: Secondary | ICD-10-CM | POA: Diagnosis not present

## 2022-07-01 DIAGNOSIS — K5730 Diverticulosis of large intestine without perforation or abscess without bleeding: Secondary | ICD-10-CM | POA: Diagnosis not present

## 2022-07-01 DIAGNOSIS — N3289 Other specified disorders of bladder: Secondary | ICD-10-CM | POA: Diagnosis not present

## 2022-07-01 MED ORDER — IOHEXOL 300 MG/ML  SOLN
100.0000 mL | Freq: Once | INTRAMUSCULAR | Status: AC | PRN
  Administered 2022-07-01: 100 mL via INTRAVENOUS

## 2022-07-02 ENCOUNTER — Encounter: Payer: Self-pay | Admitting: Family Medicine

## 2022-07-02 DIAGNOSIS — R1011 Right upper quadrant pain: Secondary | ICD-10-CM

## 2022-07-02 DIAGNOSIS — N83201 Unspecified ovarian cyst, right side: Secondary | ICD-10-CM

## 2022-07-03 ENCOUNTER — Encounter: Payer: Self-pay | Admitting: *Deleted

## 2022-07-03 NOTE — Telephone Encounter (Signed)
-----   Message from Alvina Chou sent at 07/03/2022 11:28 AM EDT ----- Regarding: Lab orders for Thursday, 5.2.24 Lab orders, thanks

## 2022-07-03 NOTE — Telephone Encounter (Signed)
This encounter was created in error - please disregard.

## 2022-07-04 ENCOUNTER — Other Ambulatory Visit (INDEPENDENT_AMBULATORY_CARE_PROVIDER_SITE_OTHER): Payer: Medicare HMO

## 2022-07-04 DIAGNOSIS — N838 Other noninflammatory disorders of ovary, fallopian tube and broad ligament: Secondary | ICD-10-CM | POA: Diagnosis not present

## 2022-07-04 DIAGNOSIS — N83201 Unspecified ovarian cyst, right side: Secondary | ICD-10-CM

## 2022-07-04 DIAGNOSIS — R1011 Right upper quadrant pain: Secondary | ICD-10-CM

## 2022-07-04 DIAGNOSIS — R935 Abnormal findings on diagnostic imaging of other abdominal regions, including retroperitoneum: Secondary | ICD-10-CM | POA: Diagnosis not present

## 2022-07-04 LAB — COMPREHENSIVE METABOLIC PANEL
ALT: 12 U/L (ref 0–35)
AST: 13 U/L (ref 0–37)
Albumin: 3.9 g/dL (ref 3.5–5.2)
Alkaline Phosphatase: 62 U/L (ref 39–117)
BUN: 12 mg/dL (ref 6–23)
CO2: 27 mEq/L (ref 19–32)
Calcium: 9.3 mg/dL (ref 8.4–10.5)
Chloride: 102 mEq/L (ref 96–112)
Creatinine, Ser: 1.06 mg/dL (ref 0.40–1.20)
GFR: 51.47 mL/min — ABNORMAL LOW (ref >60.00)
Glucose, Bld: 160 mg/dL — ABNORMAL HIGH (ref 70–99)
Potassium: 4.6 mEq/L (ref 3.5–5.1)
Sodium: 137 mEq/L (ref 135–145)
Total Bilirubin: 0.4 mg/dL (ref 0.2–1.2)
Total Protein: 6.3 g/dL (ref 6.0–8.3)

## 2022-07-04 LAB — LIPASE: Lipase: 99 U/L — ABNORMAL HIGH (ref 11.0–59.0)

## 2022-07-05 ENCOUNTER — Ambulatory Visit
Admission: RE | Admit: 2022-07-05 | Discharge: 2022-07-05 | Disposition: A | Discharge status: Home / Self Care | Payer: Medicare HMO | Source: Ambulatory Visit | Attending: Family Medicine | Admitting: Family Medicine

## 2022-07-05 DIAGNOSIS — N83201 Unspecified ovarian cyst, right side: Secondary | ICD-10-CM | POA: Diagnosis not present

## 2022-07-05 DIAGNOSIS — N83291 Other ovarian cyst, right side: Secondary | ICD-10-CM | POA: Diagnosis not present

## 2022-07-05 LAB — CA 125: CA 125: 13 U/mL (ref <35)

## 2022-07-08 ENCOUNTER — Ambulatory Visit (INDEPENDENT_AMBULATORY_CARE_PROVIDER_SITE_OTHER): Payer: Medicare HMO

## 2022-07-08 DIAGNOSIS — I4819 Other persistent atrial fibrillation: Secondary | ICD-10-CM | POA: Diagnosis not present

## 2022-07-08 LAB — CUP PACEART REMOTE DEVICE CHECK
Date Time Interrogation Session: 20240503231253
Implantable Pulse Generator Implant Date: 20210816

## 2022-07-09 ENCOUNTER — Other Ambulatory Visit: Payer: Self-pay | Admitting: Internal Medicine

## 2022-07-10 ENCOUNTER — Ambulatory Visit (INDEPENDENT_AMBULATORY_CARE_PROVIDER_SITE_OTHER): Payer: Medicare HMO | Admitting: Family Medicine

## 2022-07-10 ENCOUNTER — Telehealth: Payer: Self-pay | Admitting: Family Medicine

## 2022-07-10 ENCOUNTER — Encounter: Payer: Self-pay | Admitting: Family Medicine

## 2022-07-10 VITALS — BP 120/62 | HR 78 | Temp 97.3°F | Ht 65.0 in | Wt 181.0 lb

## 2022-07-10 DIAGNOSIS — K853 Drug induced acute pancreatitis without necrosis or infection: Secondary | ICD-10-CM | POA: Diagnosis not present

## 2022-07-10 DIAGNOSIS — R300 Dysuria: Secondary | ICD-10-CM | POA: Insufficient documentation

## 2022-07-10 DIAGNOSIS — N83291 Other ovarian cyst, right side: Secondary | ICD-10-CM | POA: Diagnosis not present

## 2022-07-10 DIAGNOSIS — N83201 Unspecified ovarian cyst, right side: Secondary | ICD-10-CM | POA: Diagnosis not present

## 2022-07-10 DIAGNOSIS — R1011 Right upper quadrant pain: Secondary | ICD-10-CM

## 2022-07-10 DIAGNOSIS — M19012 Primary osteoarthritis, left shoulder: Secondary | ICD-10-CM | POA: Diagnosis not present

## 2022-07-10 LAB — POC URINALSYSI DIPSTICK (AUTOMATED)
Bilirubin, UA: NEGATIVE
Blood, UA: 10
Glucose, UA: NEGATIVE
Ketones, UA: NEGATIVE
Nitrite, UA: NEGATIVE
Protein, UA: NEGATIVE
Spec Grav, UA: 1.01 (ref 1.010–1.025)
Urobilinogen, UA: 0.2 E.U./dL
pH, UA: 6 (ref 5.0–8.0)

## 2022-07-10 MED ORDER — CIPROFLOXACIN HCL 250 MG PO TABS
250.0000 mg | ORAL_TABLET | Freq: Two times a day (BID) | ORAL | 0 refills | Status: DC
Start: 2022-07-10 — End: 2022-07-24

## 2022-07-10 NOTE — Assessment & Plan Note (Signed)
Acute, persistent She is in the midst of workup of right upper quadrant pain, not clearly associated with cable possible given recurrent urinary tract symptoms starting within the last few days. She will remain off of Tradjenta given possible associated pancreatitis.  She is now having pain more over the epigastric region and will consider gastritis as diagnosis given associated reflux symptoms.  She will start Prilosec 20 mg x 2 daily for the next 4 to 6 weeks.  If this is not improving her symptoms we can look further with a right upper quadrant ultrasound.

## 2022-07-10 NOTE — Assessment & Plan Note (Signed)
Acute, no improvement in pain off of Tradjenta but lipase is trending towards normal.  She reports that her blood sugars have been well-controlled despite being off Tradjenta.

## 2022-07-10 NOTE — Telephone Encounter (Signed)
Pt called in stated she has a device in her right buttocks it can be turn off. Wants to know if it will effect the MRI . Please advise [phone]

## 2022-07-10 NOTE — Patient Instructions (Addendum)
We will call with culture results.  We will look  into [name] results.  Start OTC prilosec 20 mg 2 tabs daily for possible gastritis, as cause of  pain.  Start antibiotic for UTI.  If pain not improving we can evaluate further with [name] of  right upper quadrant.

## 2022-07-10 NOTE — Progress Notes (Addendum)
Patient ID: [name], female    DOB: 15-Mar-1945, 77 y.o.   MRN: [id number]  This visit was conducted in person.  BP 120/62 (BP Location: Left Arm, Patient Position: Sitting, Cuff Size: Normal)   Pulse 78   Temp (!) 97.3 F (36.3 C) (Temporal)   Ht 5\' 5"  (1.651 m)   Wt 181 lb (82.1 kg)   LMP  (LMP Unknown)   SpO2 96%   BMI 30.12 kg/m    CC:  Chief Complaint  Patient presents with   Dysuria    And frequency since yesterday. Took azo yesterday but none today.     Subjective:   HPI: [name] is a 77 y.o. female presenting on 07/10/2022 for Dysuria (And frequency since yesterday. Took azo yesterday but none today. )  Dysuria  This is a new problem. The current episode started yesterday. The problem has been gradually worsening. The quality of the pain is described as burning. The pain is moderate. There has been no fever. She is Not sexually active. Associated symptoms include frequency, nausea and urgency. Pertinent negatives include no chills, discharge, hematuria, sweats or vomiting. She has tried increased fluids for the symptoms. There is no history of catheterization or recurrent UTIs.     She has been recently seen with right upper quadrant pain... Stopped Tradjenta for possible pancreatitis given elevated lipase.  Lipase trending down. CT abdomen pelvis showed no clear pancreatitis.  Ovarian cyst noted incidentally on CT , CA125 negative and moved ahead with ultrasound transpelvic.  Results are not back yet.  Now having some nausea... sill with RUQ pain , now epigastric pain, no CVA tenderness.   RUQ pain and epigastric pain worse with eating.    Klensiella UTI in 12/23 Relevant past medical, surgical, family and social history reviewed and updated as indicated. Interim medical history since our last visit reviewed. Allergies and medications reviewed and updated. Outpatient Medications Prior to Visit  Medication Sig Dispense Refill   apixaban (ELIQUIS) 5 MG  TABS tablet TAKE ONE TABLET BY MOUTH TWICE DAILY 60 tablet 5   calcium carbonate (OS-CAL - DOSED IN MG OF ELEMENTAL CALCIUM) 1250 (500 Ca) MG tablet Take 1 tablet by mouth 2 (two) times a week.     dapagliflozin propanediol (FARXIGA) 5 MG TABS tablet Take 1 tablet (5 mg total) by mouth daily before breakfast. 90 tablet 3   dofetilide (TIKOSYN) 250 MCG capsule TAKE 1 CAPSULE BY MOUTH TWO TIMES DAILY 60 capsule 0   hydroxypropyl methylcellulose / hypromellose (ISOPTO TEARS / GONIOVISC) 2.5 % ophthalmic solution Place 1 drop into both eyes 2 (two) times daily as needed for dry eyes.     magnesium oxide (MAG-OX) 400 MG tablet Take 1 tablet (400 mg total) by mouth 2 (two) times daily. 60 tablet 6   metFORMIN (GLUCOPHAGE-XR) 500 MG 24 hr tablet TAKE TWO TABLETS BY MOUTH TWICE DAILY WITH MEALS 360 tablet 0   metoprolol succinate (TOPROL-XL) 25 MG 24 hr tablet Take one tablet by mouth at bedtime (Patient taking differently: Take 12.5 mg by mouth in the morning and at bedtime.) 90 tablet 3   MYRBETRIQ 50 MG TB24 tablet Take 50 mg by mouth every evening.     neomycin-polymyxin b-dexamethasone (MAXITROL) 3.5-10000-0.1 SUSP Place 1 drop into both eyes in the morning and at bedtime.     ONETOUCH ULTRA test strip CHECK BLOOD SUGAR 2 TIMES A DAY AS DIRECTED DX CODE E11.9 100 strip 7   repaglinide (  PRANDIN) 1 MG tablet Take 1 tablet (1 mg total) by mouth 2 (two) times daily before a meal. 180 tablet 3   sertraline (ZOLOFT) 100 MG tablet TAKE 1 TABLET BY MOUTH NIGHTLY 30 tablet 5   zolpidem (AMBIEN) 10 MG tablet Take 5 mg by mouth at bedtime.     tiZANidine (ZANAFLEX) 4 MG tablet Take 1 tablet (4 mg total) by mouth at bedtime. 30 tablet 0   colchicine 0.6 MG tablet Take 1 tablet (0.6 mg total) by mouth 2 (two) times daily for 5 days. 10 tablet 0   pantoprazole (PROTONIX) 40 MG tablet Take 1 tablet (40 mg total) by mouth daily. 45 tablet 0   doxycycline (VIBRA-TABS) 100 MG tablet Take 100 mg by mouth every evening.  (Patient not taking: Reported on 07/10/2022)     linagliptin (TRADJENTA) 5 MG TABS tablet TAKE 1 TABLET BY MOUTH DAILY (Patient not taking: Reported on 07/10/2022) 90 tablet 1   predniSONE (DELTASONE) 50 MG tablet 1 tablet 13 hours prior to scan, repeat 1 tablet 7 hours prior to scan and repeat 1 tablet 1 hour prior (Patient not taking: Reported on 07/10/2022) 3 tablet 0   No facility-administered medications prior to visit.     Per HPI unless specifically indicated in ROS section below Review of Systems  Constitutional:  Negative for chills.  Gastrointestinal:  Positive for nausea. Negative for vomiting.  Genitourinary:  Positive for dysuria, frequency and urgency. Negative for hematuria.   Objective:  BP 120/62 (BP Location: Left Arm, Patient Position: Sitting, Cuff Size: Normal)   Pulse 78   Temp (!) 97.3 F (36.3 C) (Temporal)   Ht 5\' 5"  (1.651 m)   Wt 181 lb (82.1 kg)   LMP  (LMP Unknown)   SpO2 96%   BMI 30.12 kg/m   Wt Readings from Last 3 Encounters:  07/10/22 181 lb (82.1 kg)  06/27/22 184 lb (83.5 kg)  05/22/22 184 lb 12.8 oz (83.8 kg)      Physical Exam Constitutional:      General: She is not in acute distress.    Appearance: Normal appearance. She is well-developed. She is not ill-appearing or toxic-appearing.  HENT:     Head: Normocephalic.     Right Ear: Hearing, tympanic membrane, ear canal and external ear normal. Tympanic membrane is not erythematous, retracted or bulging.     Left Ear: Hearing, tympanic membrane, ear canal and external ear normal. Tympanic membrane is not erythematous, retracted or bulging.     Nose: No mucosal edema or rhinorrhea.     Right Sinus: No maxillary sinus tenderness or frontal sinus tenderness.     Left Sinus: No maxillary sinus tenderness or frontal sinus tenderness.     Mouth/Throat:     Pharynx: Uvula midline.  Eyes:     General: Lids are normal. Lids are everted, no foreign bodies appreciated.     Conjunctiva/sclera:  Conjunctivae normal.     Pupils: Pupils are equal, round, and reactive to light.  Neck:     Thyroid: No thyroid mass or thyromegaly.     Vascular: No carotid bruit.     Trachea: Trachea normal.  Cardiovascular:     Rate and Rhythm: Normal rate and regular rhythm.     Pulses: Normal pulses.     Heart sounds: Normal heart sounds, S1 normal and S2 normal. No murmur heard.    No friction rub. No gallop.  Pulmonary:     Effort: Pulmonary effort is normal. No  tachypnea or respiratory distress.     Breath sounds: Normal breath sounds. No decreased breath sounds, wheezing, rhonchi or rales.  Abdominal:     General: Bowel sounds are normal.     Palpations: Abdomen is soft.     Tenderness: There is abdominal tenderness in the right upper quadrant and epigastric area. There is no right CVA tenderness or left CVA tenderness.     Hernia: No hernia is present.  Musculoskeletal:     Cervical back: Normal range of motion and neck supple.  Skin:    General: Skin is warm and dry.     Findings: No rash.  Neurological:     Mental Status: She is alert.  Psychiatric:        Mood and Affect: Mood is not anxious or depressed.        Speech: Speech normal.        Behavior: Behavior normal. Behavior is cooperative.        Thought Content: Thought content normal.        Judgment: Judgment normal.       Results for orders placed or performed in visit on 07/10/22  POCT Urinalysis Dipstick (Automated)  Result Value Ref Range   Color, UA light yellow    Clarity, UA clear    Glucose, UA Negative Negative   Bilirubin, UA neg    Ketones, UA neg    Spec Grav, UA 1.010 1.010 - 1.025   Blood, UA 10 ery/uL    pH, UA 6.0 5.0 - 8.0   Protein, UA Negative Negative   Urobilinogen, UA 0.2 0.2 or 1.0 E.U./dL   Nitrite, UA neg    Leukocytes, UA Moderate (2+) (A) Negative    Assessment and Plan  Dysuria Assessment & Plan: Acute, will treat empirically for a urinary tract infection given appearance of  urinalysis and classic symptoms. Send urine for culture Will treat with Cipro 250 mg p.o. twice daily given possible extension to upper urinary tract with right side pain.  Of note CT scan did mention air and bladder which can possibly be associated with urinary tract infection.  Orders: -     POCT Urinalysis Dipstick (Automated) -     Urine Culture  Right ovarian cyst Assessment & Plan: At the time of the office visit results were pending.  Since then ultrasounds results have returned showing complex right ovarian cyst.  Ca1 25 was normal.  Will move ahead with further imaging with MRI pelvis with contrast. Family history of ovarian cancer.   RUQ pain Assessment & Plan: Acute, persistent She is in the midst of workup of right upper quadrant pain, not clearly associated with cable possible given recurrent urinary tract symptoms starting within the last few days. She will remain off of Tradjenta given possible associated pancreatitis.  She is now having pain more over the epigastric region and will consider gastritis as diagnosis given associated reflux symptoms.  She will start Prilosec 20 mg x 2 daily for the next 4 to 6 weeks.  If this is not improving her symptoms we can look further with a right upper quadrant ultrasound.   Drug-induced acute pancreatitis without infection or necrosis Assessment & Plan: Acute, no improvement in pain off of Tradjenta but lipase is trending towards normal.  She reports that her blood sugars have been well-controlled despite being off Tradjenta.   Complex cyst of right ovary -     MR PELVIS W CONTRAST; Future  Other orders -  Ciprofloxacin HCl; Take 1 tablet (250 mg total) by mouth 2 (two) times daily.  Dispense: 14 tablet; Refill: 0    Return if symptoms worsen or fail to improve.   Kerby Nora, MD

## 2022-07-10 NOTE — Telephone Encounter (Signed)
See order referral notes... this is notated.   Patient does have a Loop Recorder - [facility] is aware of this.    [employer] Reveal [place] model [id number] [phone] G) implantable loop recorder    I called patient again and advised her that when we schedule her appt they will give further instruction on if this needs to be turned off prior to her appt.

## 2022-07-10 NOTE — Assessment & Plan Note (Signed)
At the time of the office visit results were pending.  Since then ultrasounds results have returned showing complex right ovarian cyst.  Ca1 25 was normal.  Will move ahead with further imaging with MRI pelvis with contrast. Family history of ovarian cancer.

## 2022-07-10 NOTE — Assessment & Plan Note (Signed)
Acute, will treat empirically for a urinary tract infection given appearance of urinalysis and classic symptoms. Send urine for culture Will treat with Cipro 250 mg p.o. twice daily given possible extension to upper urinary tract with right side pain.  Of note CT scan did mention air and bladder which can possibly be associated with urinary tract infection.

## 2022-07-11 ENCOUNTER — Other Ambulatory Visit: Payer: Self-pay | Admitting: Family Medicine

## 2022-07-11 ENCOUNTER — Ambulatory Visit
Admission: RE | Admit: 2022-07-11 | Discharge: 2022-07-11 | Disposition: A | Discharge status: Home / Self Care | Payer: Medicare HMO | Source: Ambulatory Visit | Attending: Family Medicine | Admitting: Family Medicine

## 2022-07-11 DIAGNOSIS — N83291 Other ovarian cyst, right side: Secondary | ICD-10-CM | POA: Insufficient documentation

## 2022-07-11 DIAGNOSIS — K5730 Diverticulosis of large intestine without perforation or abscess without bleeding: Secondary | ICD-10-CM | POA: Diagnosis not present

## 2022-07-11 MED ORDER — GADOBUTROL 1 MMOL/ML IV SOLN
8.0000 mL | Freq: Once | INTRAVENOUS | Status: AC | PRN
  Administered 2022-07-11: 8 mL via INTRAVENOUS

## 2022-07-11 NOTE — Progress Notes (Signed)
Carelink Summary Report / Loop Recorder 

## 2022-07-12 ENCOUNTER — Other Ambulatory Visit: Payer: Self-pay | Admitting: Family Medicine

## 2022-07-12 DIAGNOSIS — Z8041 Family history of malignant neoplasm of ovary: Secondary | ICD-10-CM

## 2022-07-12 DIAGNOSIS — N83291 Other ovarian cyst, right side: Secondary | ICD-10-CM

## 2022-07-13 LAB — URINE CULTURE
MICRO NUMBER:: 14930137
SPECIMEN QUALITY:: ADEQUATE

## 2022-07-15 ENCOUNTER — Encounter: Payer: Self-pay | Admitting: *Deleted

## 2022-07-18 DIAGNOSIS — N83201 Unspecified ovarian cyst, right side: Secondary | ICD-10-CM | POA: Diagnosis not present

## 2022-07-23 NOTE — Progress Notes (Unsigned)
  Electrophysiology Office Follow up Visit Note:    Date:  07/24/2022   ID:  [name], DOB [birth year], MRN [id number]  PCP:  [name], MD  [facility] Cardiologist:  Lorine Bears, MD  [facility] Electrophysiologist:  Sherryl Manges, MD    Interval History:    [name] is a 77 y.o. female who presents for a follow up visit.   She had a redo catheter ablation 04/23/2022 during which bilateral PV's were reisolated, the posterior wall was isolated and typical/atypical flutters were ablated.  ILR since ablation have shown no AF. The ILR reports AF but this is actually sinus with PAC's and brief salvos of AT.  Today she tells me she is feeling better than prior to her ablation.  She still feels fatigued but wonders whether or not this could be related to her recently diagnosed malignancy.  She has an appointment coming up at [facility] to further investigate her ovarian cancer and treatment strategies.    Past medical, surgical, social and family history were reviewed.  ROS:   Please see the history of present illness.    All other systems reviewed and are negative.  EKGs/Labs/Other Studies Reviewed:    The following studies were reviewed today:  EKG:  The ekg ordered today demonstrates sinus arrhythmia.  Low amplitude P wave   Physical Exam:    VS:  BP 120/70 (BP Location: Left Arm, Patient Position: Sitting, Cuff Size: Large)   Pulse 61   Ht 5\' 5"  (1.651 m)   Wt 181 lb 6 oz (82.3 kg)   LMP  (LMP Unknown)   SpO2 98%   BMI 30.18 kg/m     Wt Readings from Last 3 Encounters:  07/24/22 181 lb 6 oz (82.3 kg)  07/10/22 181 lb (82.1 kg)  06/27/22 184 lb (83.5 kg)     GEN:  Well nourished, well developed in no acute distress CARDIAC: Irregular rhythm consistent with sinus arrhythmia, no murmurs, rubs, gallops RESPIRATORY:  Clear to auscultation without rales, wheezing or rhonchi       ASSESSMENT:    1. Persistent atrial fibrillation (HCC)   2. SVT  (supraventricular tachycardia)   3. Premature atrial contractions   4. Encounter for long-term (current) use of high-risk medication    PLAN:    In order of problems listed above:  #AF #AFL #AT #High risk drug monitoring-dofetilide Doing well after catheter ablation without sustained recurrence. Continue eliquis Continue dofetilide. Cr and QTc stable for continued use today.  Follow up 3 months with APP.  Blood work at that visit.        Signed, Steffanie Dunn, MD, [facility], [facility] 07/24/2022 11:34 AM    Electrophysiology [facility]

## 2022-07-24 ENCOUNTER — Encounter: Payer: Self-pay | Admitting: Cardiology

## 2022-07-24 ENCOUNTER — Ambulatory Visit: Payer: Medicare HMO | Attending: Cardiology | Admitting: Cardiology

## 2022-07-24 VITALS — BP 120/70 | HR 61 | Ht 65.0 in | Wt 181.4 lb

## 2022-07-24 DIAGNOSIS — Z79899 Other long term (current) drug therapy: Secondary | ICD-10-CM

## 2022-07-24 DIAGNOSIS — I491 Atrial premature depolarization: Secondary | ICD-10-CM

## 2022-07-24 DIAGNOSIS — I4819 Other persistent atrial fibrillation: Secondary | ICD-10-CM | POA: Diagnosis not present

## 2022-07-24 DIAGNOSIS — I471 Supraventricular tachycardia, unspecified: Secondary | ICD-10-CM | POA: Diagnosis not present

## 2022-07-24 NOTE — Patient Instructions (Signed)
Medication Instructions:  Your physician recommends that you continue on your current medications as directed. Please refer to the Current Medication list given to you today.  *If you need a refill on your cardiac medications before your next appointment, please call your pharmacy*   Follow-Up: At [facility], you and your health needs are our priority.  As part of our continuing mission to provide you with exceptional heart care, we have created designated Provider Care Teams.  These Care Teams include your primary Cardiologist (physician) and Advanced Practice Providers (APPs -  Physician Assistants and Nurse Practitioners) who all work together to provide you with the care you need, when you need it.   Your next appointment:   3 month(s)  Provider:   Suzann Riddle, NP    

## 2022-08-02 ENCOUNTER — Ambulatory Visit: Payer: Self-pay

## 2022-08-02 ENCOUNTER — Encounter: Payer: Self-pay | Admitting: Family Medicine

## 2022-08-02 DIAGNOSIS — N83201 Unspecified ovarian cyst, right side: Secondary | ICD-10-CM | POA: Diagnosis not present

## 2022-08-02 DIAGNOSIS — R1011 Right upper quadrant pain: Secondary | ICD-10-CM

## 2022-08-02 DIAGNOSIS — N83209 Unspecified ovarian cyst, unspecified side: Secondary | ICD-10-CM | POA: Diagnosis not present

## 2022-08-02 NOTE — Patient Outreach (Signed)
  Care Coordination   Follow Up Visit Note   08/02/2022 Name: [name] MRN: [id number] DOB: 12-20-1945  [name] is a 77 y.o. year old female who sees [name], MD for primary care. I spoke with  [name] by phone today.  What matters to the patients health and wellness today?  Patient states she had a recent MRI and was found to have a cyst on her ovary/ kidney. She states she is scheduled to see the gynecologist oncologist this afternoon.  Patient reports blood work regarding her pancrease was elevated. She states she was discontinued off of one of her medications and repeat of pancrease lab did come down some.  Patient reports ongoing bilateral shoulder pain. She reports today's pain level is a 10.  She states she only takes tylenol but it doesn't work. Patient states she was offered pain medication at some point by her doctor for the shoulder pain but she declined at the time.  Patient reports she gets frequent UTI's. Denies currently having UTI.  Patient declined social work referral for counseling.    Goals Addressed             This Visit's Progress    Patient Stated:  Managing Health conditions       Interventions Today    Flowsheet Row Most Recent Value  Chronic Disease   Chronic disease during today's visit Atrial Fibrillation (AFib), Other  [Cyst to ovary/ kidney, bilateral shoulder pain]  General Interventions   General Interventions Discussed/Reviewed General Interventions Reviewed, Doctor Visits, Labs  [evaluation of current treatment plan for A-fib, shoulder pain, ovarian/ kidney cyst and patients adherence to plan as established by provider. Assessed pain level, A-fib symptoms and pulse rate. Discussed Lipase lab.]  Doctor Visits Discussed/Reviewed Doctor Visits Reviewed  Annabell Sabal scheduled/ upcoming provider visits]  Education Interventions   Education Provided Provided Education  [Advised to manage chronic pain with breathing exercises, movement as  tolerated, family / friend support system, good sleep habits.]  Mental Health Interventions   Mental Health Discussed/Reviewed Mental Health Reviewed  [Offered social work follow up for counseling resources due to stress of managing health conditions.]  Pharmacy Interventions   Pharmacy Dicussed/Reviewed Pharmacy Topics Reviewed  [medications reviewed and compliance discussed.  Advised to talk with doctor regarding chronic UTI's and Farxiga medication. Advised to consider follow up with doctor regarding pain medication offer.]                  SDOH assessments and interventions completed:  No     Care Coordination Interventions:  Yes, provided   Follow up plan: Follow up call scheduled for 08/30/22 at 10:30 am    Encounter Outcome:  Pt. Visit Completed   George Ina RN,BSN,CCM [facility] Care Coordination [phone] direct line

## 2022-08-02 NOTE — Patient Instructions (Signed)
Visit Information  Thank you for taking time to visit with me today. Please don't hesitate to contact me if I can be of assistance to you.   Following are the goals we discussed today:  Discuss chronic urinary tract infections  and farxiga medication with provider.  Incorporate pain management techniques: deep breathing, good sleep habits, movement, support systems Consider talking with doctor about pain medication option.    Our next appointment is by telephone on 08/30/22 at 10:30 am  Please call the care guide team at [phone] if you need to cancel or reschedule your appointment.   If you are experiencing a Mental Health or Behavioral Health Crisis or need someone to talk to, please call the Suicide and Crisis Lifeline: 988 call 1-800-273-TALK (toll free, 24 hour hotline)  Patient verbalizes understanding of instructions and care plan provided today and agrees to view in MyChart. Active MyChart status and patient understanding of how to access instructions and care plan via MyChart confirmed with patient.     George Ina RN,BSN,CCM [facility] Care Coordination [phone] direct line

## 2022-08-03 ENCOUNTER — Other Ambulatory Visit: Payer: Self-pay | Admitting: Internal Medicine

## 2022-08-07 ENCOUNTER — Telehealth: Payer: Self-pay | Admitting: Internal Medicine

## 2022-08-07 NOTE — Progress Notes (Signed)
Carelink Summary Report / Loop Recorder 

## 2022-08-07 NOTE — Telephone Encounter (Signed)
Patent called requesting to speak with a nurse states she has a lot of abdominal pain along with bloating.

## 2022-08-08 NOTE — Telephone Encounter (Signed)
Pt stated that she has been having abdominal pain and bloating for a couple of weeks now mostly after eating. Pain in center of abdomen and right abdomen, sharp at times. Some nausea,  altered BM's. No temp. Pt had recent MRI of abdomen, Seen Oncologist who recommended her to follow up with GI. Pt was scheduled to see Dr. Leone Payor on 09/18/2022 at 8:50: Pt made aware Pt verbalized understanding with all questions answered.

## 2022-08-12 ENCOUNTER — Ambulatory Visit (INDEPENDENT_AMBULATORY_CARE_PROVIDER_SITE_OTHER): Payer: Medicare HMO

## 2022-08-12 DIAGNOSIS — I4819 Other persistent atrial fibrillation: Secondary | ICD-10-CM

## 2022-08-12 LAB — CUP PACEART REMOTE DEVICE CHECK
Date Time Interrogation Session: 20240609231332
Implantable Pulse Generator Implant Date: 20210816

## 2022-08-15 DIAGNOSIS — Z01812 Encounter for preprocedural laboratory examination: Secondary | ICD-10-CM | POA: Diagnosis not present

## 2022-08-15 DIAGNOSIS — M19012 Primary osteoarthritis, left shoulder: Secondary | ICD-10-CM | POA: Diagnosis not present

## 2022-08-22 ENCOUNTER — Ambulatory Visit (INDEPENDENT_AMBULATORY_CARE_PROVIDER_SITE_OTHER): Payer: Medicare HMO | Admitting: Family

## 2022-08-22 VITALS — BP 126/74 | HR 74 | Temp 97.6°F | Ht 65.0 in | Wt 181.0 lb

## 2022-08-22 DIAGNOSIS — N3001 Acute cystitis with hematuria: Secondary | ICD-10-CM

## 2022-08-22 DIAGNOSIS — R300 Dysuria: Secondary | ICD-10-CM

## 2022-08-22 LAB — POC URINALSYSI DIPSTICK (AUTOMATED)
Bilirubin, UA: NEGATIVE
Glucose, UA: NEGATIVE
Ketones, UA: NEGATIVE
Nitrite, UA: POSITIVE
Protein, UA: POSITIVE — AB
Spec Grav, UA: 1.01 (ref 1.010–1.025)
Urobilinogen, UA: 0.2 E.U./dL
pH, UA: 5 (ref 5.0–8.0)

## 2022-08-22 MED ORDER — CIPROFLOXACIN HCL 500 MG PO TABS
500.0000 mg | ORAL_TABLET | Freq: Two times a day (BID) | ORAL | 0 refills | Status: AC
Start: 2022-08-22 — End: 2022-08-25

## 2022-08-22 NOTE — Progress Notes (Signed)
Established Patient Office Visit  Subjective:   Patient ID: [name], female    DOB: 09/27/1945  Age: 77 y.o. MRN: [id number]  CC:  Chief Complaint  Patient presents with   Dysuria    HPI: [name] is a 77 y.o. female presenting on 08/22/2022 for Dysuria   Two days ago started with urinary frequency and also dysuria.  Denies fever or chills. Denies flank pain. With pelvic tenderness.       ROS: Negative unless specifically indicated above in HPI.   Relevant past medical history reviewed and updated as indicated.   Allergies and medications reviewed and updated.   Current Outpatient Medications:    apixaban (ELIQUIS) 5 MG TABS tablet, TAKE ONE TABLET BY MOUTH TWICE DAILY, Disp: 60 tablet, Rfl: 5   calcium carbonate (OS-CAL - DOSED IN MG OF ELEMENTAL CALCIUM) 1250 (500 Ca) MG tablet, Take 1 tablet by mouth 2 (two) times a week., Disp: , Rfl:    ciprofloxacin (CIPRO) 500 MG tablet, Take 1 tablet (500 mg total) by mouth 2 (two) times daily for 3 days., Disp: 6 tablet, Rfl: 0   dofetilide (TIKOSYN) 250 MCG capsule, TAKE 1 CAPSULE BY MOUTH TWICE DAILY, Disp: 60 capsule, Rfl: 0   hydroxypropyl methylcellulose / hypromellose (ISOPTO TEARS / GONIOVISC) 2.5 % ophthalmic solution, Place 1 drop into both eyes 2 (two) times daily as needed for dry eyes., Disp: , Rfl:    magnesium oxide (MAG-OX) 400 MG tablet, Take 1 tablet (400 mg total) by mouth 2 (two) times daily., Disp: 60 tablet, Rfl: 6   metFORMIN (GLUCOPHAGE-XR) 500 MG 24 hr tablet, TAKE TWO TABLETS BY MOUTH TWICE DAILY WITH MEALS, Disp: 360 tablet, Rfl: 0   metoprolol succinate (TOPROL-XL) 25 MG 24 hr tablet, Take one tablet by mouth at bedtime (Patient taking differently: Take 12.5 mg by mouth in the morning and at bedtime.), Disp: 90 tablet, Rfl: 3   MYRBETRIQ 50 MG TB24 tablet, Take 50 mg by mouth every evening., Disp: , Rfl:    ONETOUCH ULTRA test strip, CHECK BLOOD SUGAR 2 TIMES A DAY AS DIRECTED DX CODE E11.9,  Disp: 100 strip, Rfl: 7   oxyCODONE (OXY IR/ROXICODONE) 5 MG immediate release tablet, Take by mouth., Disp: , Rfl:    pantoprazole (PROTONIX) 40 MG tablet, Take 1 tablet (40 mg total) by mouth daily., Disp: 45 tablet, Rfl: 0   repaglinide (PRANDIN) 1 MG tablet, Take 1 tablet (1 mg total) by mouth 2 (two) times daily before a meal., Disp: 180 tablet, Rfl: 3   sertraline (ZOLOFT) 100 MG tablet, TAKE 1 TABLET BY MOUTH NIGHTLY, Disp: 30 tablet, Rfl: 5   traMADol (ULTRAM) 50 MG tablet, Take One tab PO Q6 hours PRN pain, Disp: , Rfl:    zolpidem (AMBIEN) 10 MG tablet, Take 5 mg by mouth at bedtime., Disp: , Rfl:    metoprolol succinate (TOPROL-XL) 25 MG 24 hr tablet, Take by mouth., Disp: , Rfl:   Allergies  Allergen Reactions   Pravachol [Pravastatin Sodium] Other (See Comments)    Leg pain    Contrast Media [Iodinated Contrast Media] Rash   Cortisone Rash    Cortisone injection NOT PREDNISONE >40 years ago.. caused rash in setting of treating poison ivy? If true reaction. Has tolerated prednisolone in past in form of eye drops    Objective:   BP 126/74   Pulse 74   Temp 97.6 F (36.4 C) (Temporal)   Ht 5\' 5"  (1.651 m)  Wt 181 lb (82.1 kg)   LMP  (LMP Unknown)   SpO2 94%   BMI 30.12 kg/m    Physical Exam Constitutional:      General: She is not in acute distress.    Appearance: Normal appearance. She is normal weight. She is not ill-appearing, toxic-appearing or diaphoretic.  Cardiovascular:     Rate and Rhythm: Normal rate.  Pulmonary:     Effort: Pulmonary effort is normal.  Abdominal:     General: Abdomen is flat.     Tenderness: There is abdominal tenderness (bil suprapubic tenderness). There is right CVA tenderness and left CVA tenderness.  Neurological:     General: No focal deficit present.     Mental Status: She is alert and oriented to person, place, and time. Mental status is at baseline.     Motor: No weakness.  Psychiatric:        Mood and Affect: Mood  normal.        Behavior: Behavior normal.        Thought Content: Thought content normal.        Judgment: Judgment normal.     Assessment & Plan:  Dysuria -     POCT Urinalysis Dipstick (Automated)  Acute cystitis with hematuria Assessment & Plan: With flank pain, worry for kidney infection  Will treat with ciprofloxacin 500 mg po bid x 3 days.  poct urine dip in office Urine culture ordered pending results antbx sent to pharmacy, pt to take as directed. Encouraged increased water intake throughout the day. Choosing to treat due to being symptomatic. If no improvement in the next 2 days pt advised to let me know.    Orders: -     Ciprofloxacin HCl; Take 1 tablet (500 mg total) by mouth 2 (two) times daily for 3 days.  Dispense: 6 tablet; Refill: 0     Follow up plan: Return for f/u PCP if no improvement in symptoms.  Mort Sawyers, FNP

## 2022-08-22 NOTE — Addendum Note (Signed)
Addended by: Donnamarie Poag on: 08/22/2022 09:39 AM   Modules accepted: Orders

## 2022-08-22 NOTE — Patient Instructions (Signed)
It was a pleasure seeing you today.   You were found to have a urinary tract infection, you have been prescribed an antibiotic to your preferred pharmacy. Please start antibiotic today as directed.   We are sending your urine for a culture to make sure you do not have a resistant bacteria. We will call you if we need to change your medications.   Please make sure you are drinking plenty of fluids over the next few days.  If your symptoms do not improve over the next 5-7 days, or if they worsen, please let us know. Please also let us know if you have worsening back pain, fevers, chills, or body aches.   Regards,   Shaaron Golliday  

## 2022-08-22 NOTE — Assessment & Plan Note (Signed)
With flank pain, worry for kidney infection  Will treat with ciprofloxacin 500 mg po bid x 3 days.  poct urine dip in office Urine culture ordered pending results antbx sent to pharmacy, pt to take as directed. Encouraged increased water intake throughout the day. Choosing to treat due to being symptomatic. If no improvement in the next 2 days pt advised to let me know.

## 2022-08-23 LAB — URINALYSIS W MICROSCOPIC + REFLEX CULTURE: RBC / HPF: NONE SEEN /HPF (ref 0–2)

## 2022-08-23 LAB — CULTURE INDICATED

## 2022-08-24 LAB — URINE CULTURE
MICRO NUMBER:: 15110738
SPECIMEN QUALITY:: ADEQUATE

## 2022-08-24 LAB — URINALYSIS W MICROSCOPIC + REFLEX CULTURE
Bilirubin Urine: NEGATIVE
Glucose, UA: NEGATIVE
Ketones, ur: NEGATIVE
Nitrites, Initial: NEGATIVE
Specific Gravity, Urine: 1.009 (ref 1.001–1.035)
pH: 5.5 (ref 5.0–8.0)

## 2022-08-24 LAB — EXTRA URINE SPECIMEN

## 2022-08-26 ENCOUNTER — Ambulatory Visit: Payer: Medicare HMO | Admitting: Internal Medicine

## 2022-08-26 ENCOUNTER — Telehealth: Payer: Self-pay | Admitting: Cardiovascular Disease

## 2022-08-26 ENCOUNTER — Telehealth: Payer: Self-pay | Admitting: *Deleted

## 2022-08-26 ENCOUNTER — Encounter: Payer: Self-pay | Admitting: Internal Medicine

## 2022-08-26 VITALS — BP 120/70 | HR 67 | Ht 65.0 in | Wt 181.0 lb

## 2022-08-26 DIAGNOSIS — E063 Autoimmune thyroiditis: Secondary | ICD-10-CM

## 2022-08-26 DIAGNOSIS — E1165 Type 2 diabetes mellitus with hyperglycemia: Secondary | ICD-10-CM | POA: Diagnosis not present

## 2022-08-26 DIAGNOSIS — E119 Type 2 diabetes mellitus without complications: Secondary | ICD-10-CM

## 2022-08-26 DIAGNOSIS — E7800 Pure hypercholesterolemia, unspecified: Secondary | ICD-10-CM

## 2022-08-26 DIAGNOSIS — Z7984 Long term (current) use of oral hypoglycemic drugs: Secondary | ICD-10-CM

## 2022-08-26 LAB — POCT GLYCOSYLATED HEMOGLOBIN (HGB A1C): Hemoglobin A1C: 7.1 % — AB (ref 4.0–5.6)

## 2022-08-26 MED ORDER — METFORMIN HCL ER 500 MG PO TB24: ORAL_TABLET | ORAL | 3 refills | Status: DC

## 2022-08-26 MED ORDER — ONETOUCH ULTRA VI STRP: ORAL_STRIP | 3 refills | Status: AC

## 2022-08-26 MED ORDER — INSULIN PEN NEEDLE 32G X 4 MM MISC: 3 refills | Status: DC

## 2022-08-26 NOTE — Telephone Encounter (Signed)
   Pre-operative Risk Assessment    Patient Name: [name]  DOB: [birth year] MRN: [id number]      Request for Surgical Clearance    Procedure:   l total shoulder arthroplasty  Date of Surgery:  Clearance 08/29/22                                 Surgeon:  Dr. Odis Luster Surgeon's Group or Practice Name:  [facility] Phone number:  [phone] Fax number:  [phone]   Type of Clearance Requested:   - Pharmacy:  Hold Apixaban (Eliquis) 3 days hold   Type of Anesthesia:   tbd   Additional requests/questions:    [name]   08/26/2022, 3:14 PM

## 2022-08-26 NOTE — Telephone Encounter (Signed)
Office is requesting a callback as soon as possible regarding a clearance form she stated she faxed over for pt in May. Please advise

## 2022-08-26 NOTE — Patient Instructions (Addendum)
Please continue: - Metformin ER 1000 mg 2x a day  Try to restart: - Basaglar 8-12 units at bedtime.  Please return in 4 months with your sugar log.

## 2022-08-26 NOTE — Telephone Encounter (Signed)
Called back and S/w Lanora Manis. Shelva Majestic  the correct fax number to send pre op clearance. Will call Lanora Manis back once received.

## 2022-08-26 NOTE — Telephone Encounter (Signed)
   Pre-operative Risk Assessment    Patient Name: [name]  DOB: 03/30/1945 MRN: [id number]      Request for Surgical Clearance    Procedure:   Left Total shoulder arthroplasty.  Date of Surgery:  Clearance 08/29/22                                 Surgeon:  Dr. Odis Luster  Surgeon's Group or Practice Name:  [facility] Phone number:  [phone] Fax number:  [phone]   Type of Clearance Requested:   - Medical  - Pharmacy:  Hold Apixaban (Eliquis) 3 days prior   Type of Anesthesia:  Not Indicated   Additional requests/questions:  S/w Lanora Manis today at surgeons office.  Lanora Manis would like to be contacted at [phone] if pt cannot have surgery on 08/29/2022.  Signed, Emmit Pomfret   08/26/2022, 10:22 AM

## 2022-08-26 NOTE — Telephone Encounter (Signed)
Duplicate request

## 2022-08-26 NOTE — Telephone Encounter (Signed)
Patient with diagnosis of afib on Eliquis for anticoagulation.    Procedure: left total shoulder arthroplasty Date of procedure: 08/29/22  CHA2DS2-VASc Score = 5  This indicates a 7.2% annual risk of stroke. The patient's score is based upon: CHF History: 0 HTN History: 1 Diabetes History: 1 Stroke History: 0 Vascular Disease History: 0 Age Score: 2 Gender Score: 1   Pt underwent afib ablation on 04/23/22. Has since completed 3 months of uninterrupted anticoag after.  CrCl 80mL/min Platelet count 312K  Per office protocol, patient can hold Eliquis for 3 days prior to procedure. She will need to start holding her Eliquis today for 6/27 procedure.  **This guidance is not considered finalized until pre-operative APP has relayed final recommendations.**

## 2022-08-26 NOTE — Telephone Encounter (Signed)
     Primary Cardiologist: Lorine Bears, MD  Chart reviewed as part of pre-operative protocol coverage. Given past medical history and time since last visit, based on ACC/AHA guidelines, [name] would be at acceptable risk for the planned procedure without further cardiovascular testing.   Her RCRI is a class III risk, 6.6% risk of major cardiac event.  Patient with diagnosis of afib on Eliquis for anticoagulation.     Procedure: left total shoulder arthroplasty Date of procedure: 08/29/22   CHA2DS2-VASc Score = 5  This indicates a 7.2% annual risk of stroke. The patient's score is based upon: CHF History: 0 HTN History: 1 Diabetes History: 1 Stroke History: 0 Vascular Disease History: 0 Age Score: 2 Gender Score: 1   Pt underwent afib ablation on 04/23/22. Has since completed 3 months of uninterrupted anticoag after.   CrCl 67mL/min Platelet count 312K   Per office protocol, patient can hold Eliquis for 3 days prior to procedure. She will need to start holding her Eliquis today for 6/27 procedure.    I will route this recommendation to the requesting party via Epic fax function and remove from pre-op pool.  Please call with questions.  Thomasene Ripple. Abron Neddo NP-C     08/26/2022, 11:10 AM [facility] [address] Office [phone] Fax [phone]
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
## 2022-08-26 NOTE — Telephone Encounter (Signed)
S/w the pt's husband that the pt has been cleared and ok to hold Eliquis x 3 days prior to surgery. I informed the pt's husband the pt will need to hold Eliquis starting today and resume once surgeon feels it is safe. Pt's husband thanked me for the help.

## 2022-08-28 ENCOUNTER — Encounter: Payer: Self-pay | Admitting: Internal Medicine

## 2022-08-28 DIAGNOSIS — E1165 Type 2 diabetes mellitus with hyperglycemia: Secondary | ICD-10-CM

## 2022-08-29 MED ORDER — BASAGLAR KWIKPEN 100 UNIT/ML ~~LOC~~ SOPN
8.0000 [IU] | PEN_INJECTOR | Freq: Every day | SUBCUTANEOUS | 1 refills | Status: DC
Start: 2022-08-29 — End: 2023-11-04

## 2022-08-30 ENCOUNTER — Ambulatory Visit: Payer: Self-pay

## 2022-08-30 NOTE — Patient Outreach (Signed)
  Care Coordination   Follow Up Visit Note   08/30/2022 Name: MEDA PASCUZZI MRN: [id number] DOB: September 23, 1945  [name] is a 77 y.o. year old female who sees [name], MD for primary care. I spoke with  [name] by phone today.  What matters to the patients health and wellness today?   DM;  Patient states most recent Hgb A1 c was 7.1. She states provider added Basaglar to treatment plan.  She reports blood sugars are ranging from 114 to 170. Denies any BS <70 since treatment change. A-FIB:  Denies any increase symptoms. UTI: Patient reports completing 3 day antibiotic treatment.  Reports symptom free. Shoulder pain:  Patient states she was scheduled to have surgery on her shoulder on yesterday. She reports surgery was cancelled due to prior authorization not being completed with her insurance company. Ovarian cyst:  Patient reports having visit with GYN. She states the doctor doesn't think it is cancer.  She states she is to follow up in 2-3 months for repeat ultrasound.      Goals Addressed             This Visit's Progress    Patient Stated:  Managing Health conditions       Interventions Today    Flowsheet Row Most Recent Value  Chronic Disease   Chronic disease during today's visit Diabetes, Atrial Fibrillation (AFib), Other  [ovarian cyst, bilateral shoulder pain, UTI]  General Interventions   General Interventions Discussed/Reviewed General Interventions Reviewed, Labs, Doctor Visits  [evaluation of current treatment plan for DM, A-FIB, ovarian cyst, bilateral shoulder pain, UTI and patients adherence to plan as established by provider.  Assessed BS, a-fib symptoms, follow up for ovarian cyst, shoulder surgery, UTI symptoms.]  Labs Hgb A1c every 6 months  [discussed recent Hgb A1c and endocrinologist plan of care.]  Doctor Visits Discussed/Reviewed Doctor Visits Reviewed  [facility] upcoming provider visits. Discussed GYN visit regarding ovarian cyst.]  Education  Interventions   Education Provided Provided Education  [UTI education provided:  Drink plenty of water, don't hold urine for long periods, keep vaginal area clean, wipe front to back.]  Pharmacy Interventions   Pharmacy Dicussed/Reviewed Pharmacy Topics Reviewed  [medications reviewed and compliance discussed. Discussed new addition of baslagar to diabetes treatment plan. Assessed if patient completed antibiotic course for UTI.]                  SDOH assessments and interventions completed:  No     Care Coordination Interventions:  Yes, provided   Follow up plan: Follow up call scheduled for 10/03/22    Encounter Outcome:  Pt. Visit Completed   George Ina RN,BSN,CCM [facility] Care Coordination [phone] direct line

## 2022-08-30 NOTE — Patient Instructions (Addendum)
Visit Information  Thank you for taking time to visit with me today. Please don't hesitate to contact me if I can be of assistance to you.   Following are the goals we discussed today:   Goals Addressed             This Visit's Progress    Patient Stated:  Managing Health conditions       Interventions Today    Flowsheet Row Most Recent Value  Chronic Disease   Chronic disease during today's visit Diabetes, Atrial Fibrillation (AFib), Other  [ovarian cyst, bilateral shoulder pain, UTI]  General Interventions   General Interventions Discussed/Reviewed General Interventions Reviewed, Labs, Doctor Visits  [evaluation of current treatment plan for DM, A-FIB, ovarian cyst, bilateral shoulder pain, UTI and patients adherence to plan as established by provider.  Assessed BS, a-fib symptoms, follow up for ovarian cyst, shoulder surgery, UTI symptoms.]  Labs Hgb A1c every 6 months  [discussed recent Hgb A1c and endocrinologist plan of care.]  Doctor Visits Discussed/Reviewed Doctor Visits Reviewed  [facility] upcoming provider visits. Discussed GYN visit regarding ovarian cyst.]  Education Interventions   Education Provided Provided Education  [UTI education provided:  Drink plenty of water, don't hold urine for long periods, keep vaginal area clean, wipe front to back.]  Pharmacy Interventions   Pharmacy Dicussed/Reviewed Pharmacy Topics Reviewed  [medications reviewed and compliance discussed. Discussed new addition of baslagar to diabetes treatment plan. Assessed if patient completed antibiotic course for UTI.]                  Our next appointment is by telephone on 10/03/22 at 10 am  Please call the care guide team at [phone] if you need to cancel or reschedule your appointment.   If you are experiencing a Mental Health or Behavioral Health Crisis or need someone to talk to, please call the Suicide and Crisis Lifeline: 988 call 1-800-273-TALK (toll free, 24 hour  hotline)  Patient verbalizes understanding of instructions and care plan provided today and agrees to view in MyChart. Active MyChart status and patient understanding of how to access instructions and care plan via MyChart confirmed with patient.     George Ina RN,BSN,CCM [facility] Care Coordination [phone] direct line

## 2022-09-04 NOTE — Progress Notes (Signed)
Carelink Summary Report / Loop Recorder 

## 2022-09-10 ENCOUNTER — Other Ambulatory Visit: Payer: Self-pay | Admitting: Pharmacist

## 2022-09-10 NOTE — Progress Notes (Signed)
Pharmacy Quality Measure Review  This patient is appearing on a report for being at risk of failing the adherence measure for diabetes medications this calendar year.   Medication: Tradjenta - medication was discontinued and she is now on insulin.   No action needed at this time.   Catie Eppie Gibson, PharmD, BCACP, CPP Clinical Pharmacist [facility] [phone]

## 2022-09-16 ENCOUNTER — Ambulatory Visit (INDEPENDENT_AMBULATORY_CARE_PROVIDER_SITE_OTHER): Payer: Medicare HMO

## 2022-09-16 DIAGNOSIS — I480 Paroxysmal atrial fibrillation: Secondary | ICD-10-CM

## 2022-09-16 DIAGNOSIS — I4819 Other persistent atrial fibrillation: Secondary | ICD-10-CM | POA: Diagnosis not present

## 2022-09-17 LAB — CUP PACEART REMOTE DEVICE CHECK
Date Time Interrogation Session: 20240712231004
Implantable Pulse Generator Implant Date: 20210816

## 2022-09-18 ENCOUNTER — Ambulatory Visit: Payer: Medicare HMO | Admitting: Internal Medicine

## 2022-09-18 ENCOUNTER — Encounter: Payer: Self-pay | Admitting: Internal Medicine

## 2022-09-18 ENCOUNTER — Other Ambulatory Visit (INDEPENDENT_AMBULATORY_CARE_PROVIDER_SITE_OTHER): Payer: Medicare HMO

## 2022-09-18 VITALS — BP 116/60 | HR 70 | Ht 61.0 in | Wt 181.2 lb

## 2022-09-18 DIAGNOSIS — M19012 Primary osteoarthritis, left shoulder: Secondary | ICD-10-CM | POA: Diagnosis not present

## 2022-09-18 DIAGNOSIS — N83201 Unspecified ovarian cyst, right side: Secondary | ICD-10-CM

## 2022-09-18 DIAGNOSIS — R194 Change in bowel habit: Secondary | ICD-10-CM

## 2022-09-18 DIAGNOSIS — D508 Other iron deficiency anemias: Secondary | ICD-10-CM

## 2022-09-18 DIAGNOSIS — I4891 Unspecified atrial fibrillation: Secondary | ICD-10-CM | POA: Diagnosis not present

## 2022-09-18 DIAGNOSIS — R748 Abnormal levels of other serum enzymes: Secondary | ICD-10-CM | POA: Diagnosis not present

## 2022-09-18 DIAGNOSIS — R1011 Right upper quadrant pain: Secondary | ICD-10-CM | POA: Diagnosis not present

## 2022-09-18 DIAGNOSIS — Z7901 Long term (current) use of anticoagulants: Secondary | ICD-10-CM

## 2022-09-18 DIAGNOSIS — R1013 Epigastric pain: Secondary | ICD-10-CM | POA: Diagnosis not present

## 2022-09-18 LAB — FERRITIN: Ferritin: 10.6 ng/mL (ref 10.0–291.0)

## 2022-09-18 LAB — COMPREHENSIVE METABOLIC PANEL
ALT: 12 U/L (ref 0–35)
AST: 12 U/L (ref 0–37)
Albumin: 4.4 g/dL (ref 3.5–5.2)
Alkaline Phosphatase: 86 U/L (ref 39–117)
BUN: 16 mg/dL (ref 6–23)
CO2: 27 mEq/L (ref 19–32)
Calcium: 9.9 mg/dL (ref 8.4–10.5)
Chloride: 100 mEq/L (ref 96–112)
Creatinine, Ser: 1.05 mg/dL (ref 0.40–1.20)
GFR: 51.98 mL/min — ABNORMAL LOW (ref >60.00)
Glucose, Bld: 144 mg/dL — ABNORMAL HIGH (ref 70–99)
Potassium: 4.7 mEq/L (ref 3.5–5.1)
Sodium: 135 mEq/L (ref 135–145)
Total Bilirubin: 0.3 mg/dL (ref 0.2–1.2)
Total Protein: 7.2 g/dL (ref 6.0–8.3)

## 2022-09-18 LAB — CBC WITH DIFFERENTIAL/PLATELET
Basophils Absolute: 0 10*3/uL (ref 0.0–0.1)
Basophils Relative: 0.3 % (ref 0.0–3.0)
Eosinophils Absolute: 0.1 10*3/uL (ref 0.0–0.7)
Eosinophils Relative: 1.2 % (ref 0.0–5.0)
HCT: 32.3 % — ABNORMAL LOW (ref 36.0–46.0)
Hemoglobin: 10.7 g/dL — ABNORMAL LOW (ref 12.0–15.0)
Lymphocytes Relative: 25.4 % (ref 12.0–46.0)
Lymphs Abs: 1.6 10*3/uL (ref 0.7–4.0)
MCHC: 33.1 g/dL (ref 30.0–36.0)
MCV: 84 fl (ref 78.0–100.0)
Monocytes Absolute: 0.4 10*3/uL (ref 0.1–1.0)
Monocytes Relative: 6.8 % (ref 3.0–12.0)
Neutro Abs: 4.1 10*3/uL (ref 1.4–7.7)
Neutrophils Relative %: 66.3 % (ref 43.0–77.0)
Platelets: 335 10*3/uL (ref 150.0–400.0)
RBC: 3.85 Mil/uL — ABNORMAL LOW (ref 3.87–5.11)
RDW: 15 % (ref 11.5–15.5)
WBC: 6.2 10*3/uL (ref 4.0–10.5)

## 2022-09-18 LAB — LIPASE: Lipase: 235 U/L — ABNORMAL HIGH (ref 11.0–59.0)

## 2022-09-18 MED ORDER — NA SULFATE-K SULFATE-MG SULF 17.5-3.13-1.6 GM/177ML PO SOLN: 1.0000 | Freq: Once | ORAL | 0 refills | Status: AC

## 2022-09-18 NOTE — Patient Instructions (Addendum)
Your provider has requested that you go to the basement level for lab work before leaving today. Press "B" on the elevator. The lab is located at the first door on the left as you exit the elevator.   You have been scheduled for an endoscopy and colonoscopy. Please follow the written instructions given to you at your visit today.  Please pick up your prep supplies at the pharmacy within the next 1-3 days.  If you use inhalers (even only as needed), please bring them with you on the day of your procedure.  DO NOT TAKE 7 DAYS PRIOR TO TEST- Trulicity (dulaglutide) Ozempic, Wegovy (semaglutide) Mounjaro (tirzepatide) Bydureon Bcise (exanatide extended release)  DO NOT TAKE 1 DAY PRIOR TO YOUR TEST Rybelsus (semaglutide) Adlyxin (lixisenatide) Victoza (liraglutide) Byetta (exanatide) ___________________________________________________________________________  I appreciate the opportunity to care for you. Stan Head, MD, [facility]

## 2022-09-18 NOTE — Progress Notes (Signed)
[name] 77 y.o. [birth year] [id number]  Assessment & Plan:   Encounter Diagnoses  Name Primary?   Abdominal pain, epigastric Yes   RUQ pain    Change in bowel habits    Long term current use of anticoagulant    Atrial fibrillation, unspecified type (HCC)    Right ovarian cyst    Other iron deficiency anemia     Evaluate with EGD and colonoscopy.  Sounds like an irritable bowel phenomenon but these are new, she has an ovarian mass and this elevated lipase.  CT scanning has been reassuring otherwise, I suppose she could have a Krukenberg tumor but again I think that is unlikely.  However malignancy is certainly in the differential.  Will hold Eliquis 2 days prior clarify with cardiology.  Rare but real increased risk of stroke off Eliquis and atrial fibrillation discussed with patient as well as routine standard risks of EGD and colonoscopy have been reviewed and she has reviewed the informed consent piece.  Procedures will be performed by my partner Dr. Erick Blinks 09/23/2022 to expedite this process due to my lack of availability.  Somehow she stopped her iron, she is now iron deficient and anemic again.  That is why she had her workup in 2019 I think she will need to go back on iron we have not started that yet.  Could consider capsule endoscopy of small bowel though she had responded and done well with iron before so I doubt that would lead to a major change even if she did have some AVMs.  Will see how it goes with the ovarian mass and potential need for additional workup.  Lab Results  Component Value Date   WBC 6.2 09/18/2022   HGB 10.7 (L) 09/18/2022   HCT 32.3 (L) 09/18/2022   MCV 84.0 09/18/2022   PLT 335.0 09/18/2022   Lab Results  Component Value Date   FERRITIN 10.6 09/18/2022     Chemistry      Component Value Date/Time   NA 135 09/18/2022 0941   NA 137 05/25/2020 1007   K 4.7 09/18/2022 0941   CL 100 09/18/2022 0941   CO2 27 09/18/2022 0941   BUN 16  09/18/2022 0941   BUN 12 05/25/2020 1007   CREATININE 1.05 09/18/2022 0941   CREATININE 0.94 09/24/2013 1610      Component Value Date/Time   CALCIUM 9.9 09/18/2022 0941   ALKPHOS 86 09/18/2022 0941   AST 12 09/18/2022 0941   ALT 12 09/18/2022 0941   BILITOT 0.3 09/18/2022 0941   BILITOT 0.3 11/20/2016 0919     Lab Results  Component Value Date   LIPASE 235.0 (H) 09/18/2022   CC: [name], MD Dr. Leida Lauth [facility]     Subjective:   Chief Complaint: Epigastric pain and change in bowel habits  HPI 77 year old white woman with recent diagnosis of a right ovarian cystic mass, history of iron deficiency anemia with negative workup for that by EGD and colonoscopy in 2019, who has been having epigastric pain radiating into the right upper quadrant and a change in bowel habits for several months.  She went to her primary care and lipase was elevated, she had CT scanning and other imaging that is outlined below.  She has had a right cystic ovarian mass and has seen gynecologic oncology from [facility].  Given her comorbidities with A-fib (status post ablation), age and other issues it was decided to follow-up with imaging rather than  to proceed with surgery for this.  CA125 is not elevated.  She is experiencing epigastric pain and nausea radiating to the right upper quadrant.  She is status post laparoscopic cholecystectomy years ago.  She also has a new postprandial urgent defecation problem usually with the first 2 meals of the day.  Stools are often formed but then they are often loose.  There has not been any bleeding.  She wonders if she has irritable bowel syndrome but she has never been diagnosed with that.  There is some associated bloating and gaseousness as well.  She had been iron deficient and on iron therapy she has mild anemia again and her ferritin was 8 when last checked (see below).  She does not recall being told to stop iron and cannot remember  stopping it. EGD and colonoscopy 09/02/2017 Impression:               - Multiple gastric polyps. Biopsied.  (Fundic gland polyps)                           - The examination was otherwise normal.                           - Biopsies were taken with a cold forceps for                            evaluation of celiac disease.  (Normal) Impression:               - Moderate diverticulosis in the sigmoid colon.                           - Anal papilla(e) were hypertrophied.                           - The examination was otherwise normal on direct                            and retroflexion views.                           - No specimens collected.  Wt Readings from Last 3 Encounters:  09/18/22 181 lb 4 oz (82.2 kg)  08/26/22 181 lb (82.1 kg)  08/22/22 181 lb (82.1 kg)  CT abdomen and pelvis 07/01/2022 IMPRESSION: 1. No definite acute abnormality identified in the abdomen or pelvis. No CT findings of acute pancreatitis. 2. Single small focus of gas in the bladder, of uncertain etiology and significance. Correlate for signs of cystitis or recent instrumentation. 3. Colonic diverticulosis. 4. Small hiatal hernia. 5. New 3.1 cm right ovarian cyst. Recommend follow-up pelvic ultrasound in 6-12 months for better visualization and to assess stability. Reference: JACR 2020 Feb;17(2):248-254 Pelvic ultrasound 07/05/2022 IMPRESSION: 1. Complex cystic mass within the right ovary with thickened and irregular septations and internal blood flow. Recommend further evaluation with pelvic MRI with contrast. 2. Status post hysterectomy. 3. Nonvisualization of the left ovary.   MR pelvis 07/11/2022 IMPRESSION: 1. Complex, multiseptated cystic lesion of the right ovary measuring 3.1 x 2.9 cm. No substantial solid component. 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 2. No evidence of  lymphadenopathy or metastatic disease in the pelvis. 3. Status post hysterectomy. 4. Descending and sigmoid diverticulosis. Lab Results  Component Value Date   FERRITIN 8.0 (L) 10/24/2021    Allergies  Allergen Reactions   Pravachol [Pravastatin Sodium] Other (See Comments)    Leg pain    Contrast Media [Iodinated Contrast Media] Rash   Cortisone Rash    Cortisone injection NOT PREDNISONE >40 years ago.. caused rash in setting of treating poison ivy? If true reaction. Has tolerated prednisolone in past in form of eye drops   Current Meds  Medication Sig   apixaban (ELIQUIS) 5 MG TABS tablet TAKE ONE TABLET BY MOUTH TWICE DAILY   calcium carbonate (OS-CAL - DOSED IN MG OF ELEMENTAL CALCIUM) 1250 (500 Ca) MG tablet Take 1 tablet by mouth 2 (two) times a week.   dofetilide (TIKOSYN) 250 MCG capsule TAKE 1 CAPSULE BY MOUTH TWICE DAILY   glucose blood (ONETOUCH ULTRA) test strip CHECK BLOOD SUGAR 2 TIMES A DAY AS DIRECTED DX CODE E11.9   hydroxypropyl methylcellulose / hypromellose (ISOPTO TEARS / GONIOVISC) 2.5 % ophthalmic solution Place 1 drop into both eyes 2 (two) times daily as needed for dry eyes.   Insulin Glargine (BASAGLAR KWIKPEN) 100 UNIT/ML Inject 8-12 Units into the skin at bedtime.   Insulin Pen Needle 32G X 4 MM MISC Use 1x a day   magnesium oxide (MAG-OX) 400 MG tablet Take 1 tablet (400 mg total) by mouth 2 (two) times daily.   metFORMIN (GLUCOPHAGE-XR) 500 MG 24 hr tablet TAKE TWO TABLETS BY MOUTH TWICE DAILY WITH MEALS   metoprolol succinate (TOPROL-XL) 25 MG 24 hr tablet Take one tablet by mouth at bedtime (Patient taking differently: Take 12.5 mg by mouth in the morning and at bedtime.)   MYRBETRIQ 50 MG TB24 tablet Take 50 mg by mouth daily as needed.   pantoprazole (PROTONIX) 40 MG tablet Take 1 tablet (40 mg total) by mouth daily.   sertraline (ZOLOFT) 100 MG tablet TAKE 1 TABLET BY MOUTH NIGHTLY   traMADol (ULTRAM) 50 MG tablet Take 50 mg by mouth as needed.    zolpidem (AMBIEN) 10 MG tablet Take 5 mg by mouth at bedtime.   Past Medical History:  Diagnosis Date   Anemia    Anxiety    Atrial fibrillation (HCC)    Colon polyps    Constipation    Depressive disorder, not elsewhere classified    Diabetes mellitus without complication (HCC)    Diaphragmatic hernia without mention of obstruction or gangrene    Disorders of bursae and tendons in shoulder region, unspecified    Diverticulosis of colon (without mention of hemorrhage)    External hemorrhoid    Fundic gland polyps of stomach, benign    GERD (gastroesophageal reflux disease)    History of hiatal hernia    HTN (hypertension)    Hypertension    Hypothyroidism    Internal hemorrhoids without mention of complication    Moderate mitral regurgitation    Obesity, unspecified    OSA on CPAP    compliant with CPAP   Other urinary incontinence    Persistent atrial fibrillation (HCC)    Pure hypercholesterolemia    Type II diabetes mellitus (HCC)    Urinary frequency    Urinary urgency    Vitamin B 12 deficiency    Past Surgical History:  Procedure Laterality Date   ABDOMINAL HYSTERECTOMY     ABDOMINAL  HYSTERECTOMY  1972   Have both ovaries   ATRIAL FIBRILLATION ABLATION  12/17/2016   ATRIAL FIBRILLATION ABLATION N/A 12/17/2016   Procedure: ATRIAL FIBRILLATION ABLATION;  Surgeon: [name], MD;  Location: [facility];  Service: Cardiovascular;  Laterality: N/A;   ATRIAL FIBRILLATION ABLATION N/A 04/23/2022   Procedure: ATRIAL FIBRILLATION ABLATION;  Surgeon: [name], MD;  Location: [facility];  Service: Cardiovascular;  Laterality: N/A;   CARDIAC CATHETERIZATION  1990s   [facility]; "[name]"   CARDIOVERSION N/A 05/27/2016   Procedure: CARDIOVERSION;  Surgeon: [name], MD;  Location: [facility];  Service: Cardiovascular;  Laterality: N/A;   CARDIOVERSION N/A 11/25/2016   Procedure: CARDIOVERSION;  Surgeon: [name], MD;  Location: [facility];   Service: Cardiovascular;  Laterality: N/A;   CARDIOVERSION N/A 09/18/2018   Procedure: CARDIOVERSION;  Surgeon: [name], MD;  Location: [facility];  Service: Cardiovascular;  Laterality: N/A;   CARDIOVERSION N/A 07/28/2019   Procedure: CARDIOVERSION;  Surgeon: [name], MD;  Location: [facility];  Service: Cardiovascular;  Laterality: N/A;   CARDIOVERSION N/A 09/07/2019   Procedure: CARDIOVERSION;  Surgeon: [name], MD;  Location: [facility];  Service: Cardiovascular;  Laterality: N/A;   CARDIOVERSION N/A 02/19/2022   Procedure: CARDIOVERSION;  Surgeon: [name], MD;  Location: [facility];  Service: Cardiovascular;  Laterality: N/A;   CATARACT EXTRACTION W/ INTRAOCULAR LENS  IMPLANT, BILATERAL Bilateral    COLONOSCOPY     ELECTROPHYSIOLOGIC STUDY N/A 01/24/2016   Procedure: CARDIOVERSION;  Surgeon: [name], MD;  Location: [facility];  Service: Cardiovascular;  Laterality: N/A;   implantable loop recorder implantation  10/18/2019   Medtronic Reveal [place] model [id number]) implantable loop recorder    LAPAROSCOPIC CHOLECYSTECTOMY     never stimulator     for her bladder   REDUCTION MAMMAPLASTY Bilateral    TONSILLECTOMY     TOTAL HIP ARTHROPLASTY Right 05/12/2018   Procedure: TOTAL HIP ARTHROPLASTY ANTERIOR APPROACH;  Surgeon: Durene Romans, MD;  Location: [facility];  Service: Orthopedics;  Laterality: Right;  70 mins   Social History   Social History Narrative   ** Merged History Encounter **       Exercise walks 3 times daily Lives with spouse in [place] - retired from employment 1 son and 1 daughter  Diet fruit, occ fast food, salads and lean meat  3 tea/day, no EtOH, no tobacco, no drugs    family history includes Cancer in her brother and mother; Dementia in her maternal aunt and mother; Diabetes in her brother and mother; Hyperlipidemia in her father and mother; Hypertension in her sister; Kidney cancer in her son; Lung  cancer in her father; Pulmonary embolism in her mother; Stroke in her paternal grandfather and paternal grandmother.   Review of Systems  As per HPI some depressed mood and insomnia otherwise negative Objective:   Physical Exam @BP  116/60   Pulse 70   Ht 5\' 1"  (1.549 m)   Wt 181 lb 4 oz (82.2 kg)   LMP  (LMP Unknown)   BMI 34.25 kg/m @  General:  Well-developed, well-nourished and in no acute distress Eyes:  anicteric. ENT:   Mouth and posterior pharynx free of lesions.   Lungs: Clear to auscultation bilaterally. Heart:   S1S2, no rubs, murmurs, gallops. Abdomen:  soft, mildly tender epigastrium, no hepatosplenomegaly, hernia, or mass and BS+. Lap chole scars Rectal: deferred Lymph:  no cervical or supraclavicular adenopathy. Extremities:  no edema, cyanosis or clubbing Neuro:  A&O x 3.  Psych:  appropriate mood and  Affect.   Data Reviewed: See HPI I have reviewed imaging studies including reviewing them, reviewed primary care notes gynecologic oncology notes

## 2022-09-19 ENCOUNTER — Other Ambulatory Visit: Payer: Self-pay | Admitting: Internal Medicine

## 2022-09-19 NOTE — Progress Notes (Unsigned)
[name] 77 y.o. [birth year] [id number]  Assessment & Plan:  No diagnosis found.     Subjective:   Chief Complaint:  HPI  Allergies  Allergen Reactions   Pravachol [Pravastatin Sodium] Other (See Comments)    Leg pain    Contrast Media [Iodinated Contrast Media] Rash   Cortisone Rash    Cortisone injection NOT PREDNISONE >40 years ago.. caused rash in setting of treating poison ivy? If true reaction. Has tolerated prednisolone in past in form of eye drops   No outpatient medications have been marked as taking for the 09/19/22 encounter (Orders Only) with Iva Boop, MD.   Past Medical History:  Diagnosis Date   Anemia    Anxiety    Atrial fibrillation (HCC)    Colon polyps    Constipation    Depressive disorder, not elsewhere classified    Diabetes mellitus without complication (HCC)    Diaphragmatic hernia without mention of obstruction or gangrene    Disorders of bursae and tendons in shoulder region, unspecified    Diverticulosis of colon (without mention of hemorrhage)    External hemorrhoid    Fundic gland polyps of stomach, benign    GERD (gastroesophageal reflux disease)    History of hiatal hernia    HTN (hypertension)    Hypertension    Hypothyroidism    Internal hemorrhoids without mention of complication    Moderate mitral regurgitation    Obesity, unspecified    OSA on CPAP    compliant with CPAP   Other urinary incontinence    Persistent atrial fibrillation (HCC)    Pure hypercholesterolemia    Type II diabetes mellitus (HCC)    Urinary frequency    Urinary urgency    Vitamin B 12 deficiency    Past Surgical History:  Procedure Laterality Date   ABDOMINAL HYSTERECTOMY     ABDOMINAL HYSTERECTOMY  1972   Have both ovaries   ATRIAL FIBRILLATION ABLATION  12/17/2016   ATRIAL FIBRILLATION ABLATION N/A 12/17/2016   Procedure: ATRIAL FIBRILLATION ABLATION;  Surgeon: [name], MD;  [facility];  Service:  Cardiovascular;  Laterality: N/A;   ATRIAL FIBRILLATION ABLATION N/A 04/23/2022   Procedure: ATRIAL FIBRILLATION ABLATION;  Surgeon: [name], MD;  [facility];  Service: Cardiovascular;  Laterality: N/A;   CARDIAC CATHETERIZATION  1990s   [facility]; "[name]"   CARDIOVERSION N/A 05/27/2016   Procedure: CARDIOVERSION;  Surgeon: [name], MD;  Location: [facility];  Service: Cardiovascular;  Laterality: N/A;   CARDIOVERSION N/A 11/25/2016   Procedure: CARDIOVERSION;  Surgeon: [name], MD;  Location: [facility];  Service: Cardiovascular;  Laterality: N/A;   CARDIOVERSION N/A 09/18/2018   Procedure: CARDIOVERSION;  Surgeon: [name], MD;  Location: [facility];  Service: Cardiovascular;  Laterality: N/A;   CARDIOVERSION N/A 07/28/2019   Procedure: CARDIOVERSION;  Surgeon: [name], MD;  Location: [facility];  Service: Cardiovascular;  Laterality: N/A;   CARDIOVERSION N/A 09/07/2019   Procedure: CARDIOVERSION;  Surgeon: [name], MD;  Location: [facility];  Service: Cardiovascular;  Laterality: N/A;   CARDIOVERSION N/A 02/19/2022   Procedure: CARDIOVERSION;  Surgeon: [name], MD;  Location: [facility];  Service: Cardiovascular;  Laterality: N/A;   CATARACT EXTRACTION W/ INTRAOCULAR LENS  IMPLANT, BILATERAL Bilateral    COLONOSCOPY     ELECTROPHYSIOLOGIC STUDY N/A 01/24/2016   Procedure: CARDIOVERSION;  Surgeon: [name], MD;  Location: [facility];  Service: Cardiovascular;  Laterality:  N/A;   implantable loop recorder implantation  10/18/2019   Medtronic Reveal [place] model [id number]) implantable loop recorder    LAPAROSCOPIC CHOLECYSTECTOMY     never stimulator     for her bladder   REDUCTION MAMMAPLASTY Bilateral    TONSILLECTOMY     TOTAL HIP ARTHROPLASTY Right 05/12/2018   Procedure: TOTAL HIP ARTHROPLASTY ANTERIOR APPROACH;  Surgeon: Durene Romans, MD;  Location: [facility];  Service: Orthopedics;  Laterality:  Right;  70 mins   Social History   Social History Narrative   ** Merged History Encounter **       Exercise walks 3 times daily Lives with spouse in [place] - retired from employment 1 son and 1 daughter  Diet fruit, occ fast food, salads and lean meat  3 tea/day, no EtOH, no tobacco, no drugs    family history includes Cancer in her brother and mother; Dementia in her maternal aunt and mother; Diabetes in her brother and mother; Hyperlipidemia in her father and mother; Hypertension in her sister; Kidney cancer in her son; Lung cancer in her father; Pulmonary embolism in her mother; Stroke in her paternal grandfather and paternal grandmother.   Review of Systems   Objective:   Physical Exam

## 2022-09-23 ENCOUNTER — Encounter: Payer: Self-pay | Admitting: Internal Medicine

## 2022-09-23 ENCOUNTER — Ambulatory Visit (AMBULATORY_SURGERY_CENTER): Payer: Medicare HMO | Admitting: Internal Medicine

## 2022-09-23 VITALS — BP 123/68 | HR 65 | Temp 98.0°F | Resp 21 | Ht 61.0 in | Wt 181.0 lb

## 2022-09-23 DIAGNOSIS — K2100 Gastro-esophageal reflux disease with esophagitis, without bleeding: Secondary | ICD-10-CM

## 2022-09-23 DIAGNOSIS — R1011 Right upper quadrant pain: Secondary | ICD-10-CM

## 2022-09-23 DIAGNOSIS — D509 Iron deficiency anemia, unspecified: Secondary | ICD-10-CM

## 2022-09-23 DIAGNOSIS — D123 Benign neoplasm of transverse colon: Secondary | ICD-10-CM | POA: Diagnosis not present

## 2022-09-23 DIAGNOSIS — D122 Benign neoplasm of ascending colon: Secondary | ICD-10-CM | POA: Diagnosis not present

## 2022-09-23 DIAGNOSIS — R194 Change in bowel habit: Secondary | ICD-10-CM | POA: Diagnosis not present

## 2022-09-23 DIAGNOSIS — K317 Polyp of stomach and duodenum: Secondary | ICD-10-CM | POA: Diagnosis not present

## 2022-09-23 DIAGNOSIS — R1013 Epigastric pain: Secondary | ICD-10-CM

## 2022-09-23 MED ORDER — SODIUM CHLORIDE 0.9 % IV SOLN: 500.0000 mL | Freq: Once | INTRAVENOUS | Status: DC

## 2022-09-23 NOTE — Progress Notes (Signed)
Called to room to assist during endoscopic procedure.  Patient ID and intended procedure confirmed with present staff. Received instructions for my participation in the procedure from the performing physician.  

## 2022-09-23 NOTE — Progress Notes (Signed)
Sedate, gd SR, tolerated procedure well, VSS, report to RN 

## 2022-09-23 NOTE — Progress Notes (Signed)
Pt presenting for EGD/colon today to eval epigastric pain, change in bowel habits and RUQ pain See office note with Dr. Leone Payor dated 09/18/22 She remains appropriate for procedures here today

## 2022-09-23 NOTE — Op Note (Signed)
[facility] Patient Name: [name] Procedure Date: 09/23/2022 2:03 PM MRN: [id number] Endoscopist: Beverley Fiedler , MD, [id number] Age: 77 Referring MD:  Date of Birth: 03-24-1945 Gender: Female Account #: [id number] Procedure:                Upper GI endoscopy Indications:              Epigastric abdominal pain, Abdominal pain in the                            right upper quadrant, recurrent iron deficiency                            (endoscopic eval in 2019) Medicines:                Monitored Anesthesia Care Procedure:                Pre-Anesthesia Assessment:                           - Prior to the procedure, a History and Physical                            was performed, and patient medications and                            allergies were reviewed. The patient's tolerance of                            previous anesthesia was also reviewed. The risks                            and benefits of the procedure and the sedation                            options and risks were discussed with the patient.                            All questions were answered, and informed consent                            was obtained. Prior Anticoagulants: The patient has                            taken Eliquis (apixaban), last dose was 2 days                            prior to procedure. ASA Grade Assessment: III - A                            patient with severe systemic disease. After                            reviewing the risks and benefits, the patient was  deemed in satisfactory condition to undergo the                            procedure.                           After obtaining informed consent, the endoscope was                            passed under direct vision. Throughout the                            procedure, the patient's blood pressure, pulse, and                            oxygen saturations were monitored continuously. The                             GIF [id number] was introduced through the                            mouth, and advanced to the second part of duodenum.                            The upper GI endoscopy was accomplished without                            difficulty. The patient tolerated the procedure                            well. Scope In: Scope Out: Findings:                 LA Grade A (one or more mucosal breaks less than 5                            mm, not extending between tops of 2 mucosal folds)                            esophagitis was found at the gastroesophageal                            junction.                           A 3 cm hiatal hernia was present.                           The gastroesophageal flap valve was visualized                            endoscopically and classified as Hill Grade IV (no                            fold, wide open lumen, hiatal hernia present).  Multiple 3 to 9 mm sessile polyps with no bleeding                            and no stigmata of recent bleeding were found in                            the cardia, in the gastric fundus and in the                            gastric body. This was biopsied with a cold forceps                            for histology.                           The exam of the stomach was otherwise normal.                           The examined duodenum was normal. Complications:            No immediate complications. Estimated Blood Loss:     Estimated blood loss: none. Impression:               - LA Grade A (mild) reflux esophagitis.                           - 3 cm hiatal hernia.                           - Multiple gastric polyps. Biopsied. Polyps appear                            benign and endoscopically consistent with fundic                            gland polyps.                           - Normal examined duodenum. Not biopsied today as                            no evidence for celiac  disease in 2019 by biopsy                            for IDA. Recommendation:           - Patient has a contact number available for                            emergencies. The signs and symptoms of potential                            delayed complications were discussed with the                            patient.  Return to normal activities tomorrow.                            Written discharge instructions were provided to the                            patient.                           - Resume previous diet.                           - Continue present medications.                           - Await pathology results.                           - See the other procedure note for documentation of                            additional recommendations. Beverley Fiedler, MD 09/23/2022 2:47:22 PM This report has been signed electronically.

## 2022-09-23 NOTE — Patient Instructions (Signed)
YOU HAD AN ENDOSCOPIC PROCEDURE TODAY AT THE [facility]:   Refer to the procedure report that was given to you for any specific questions about what was found during the examination.  If the procedure report does not answer your questions, please call your gastroenterologist to clarify.  If you requested that your care partner not be given the details of your procedure findings, then the procedure report has been included in a sealed envelope for you to review at your convenience later.  **handouts given on polyps and diverticulosis**  YOU SHOULD EXPECT: Some feelings of bloating in the abdomen. Passage of more gas than usual.  Walking can help get rid of the air that was put into your GI tract during the procedure and reduce the bloating. If you had a lower endoscopy (such as a colonoscopy or flexible sigmoidoscopy) you may notice spotting of blood in your stool or on the toilet paper. If you underwent a bowel prep for your procedure, you may not have a normal bowel movement for a few days.  Please Note:  You might notice some irritation and congestion in your nose or some drainage.  This is from the oxygen used during your procedure.  There is no need for concern and it should clear up in a day or so.  SYMPTOMS TO REPORT IMMEDIATELY:  Following lower endoscopy (colonoscopy or flexible sigmoidoscopy):  Excessive amounts of blood in the stool  Significant tenderness or worsening of abdominal pains  Swelling of the abdomen that is new, acute  Fever of 100F or higher  Following upper endoscopy (EGD)  Vomiting of blood or coffee ground material  New chest pain or pain under the shoulder blades  Painful or persistently difficult swallowing  New shortness of breath  Fever of 100F or higher  Black, tarry-looking stools  For urgent or emergent issues, a gastroenterologist can be reached at any hour by calling [phone]. Do not use MyChart messaging for urgent concerns.     DIET:  We do recommend a small meal at first, but then you may proceed to your regular diet.  Drink plenty of fluids but you should avoid alcoholic beverages for 24 hours.  ACTIVITY:  You should plan to take it easy for the rest of today and you should NOT DRIVE or use heavy machinery until tomorrow (because of the sedation medicines used during the test).    FOLLOW UP: Our staff will call the number listed on your records the next business day following your procedure.  We will call around 7:15- 8:00 am to check on you and address any questions or concerns that you may have regarding the information given to you following your procedure. If we do not reach you, we will leave a message.     If any biopsies were taken you will be contacted by phone or by letter within the next 1-3 weeks.  Please call us at [phone] if you have not heard about the biopsies in 3 weeks.    SIGNATURES/CONFIDENTIALITY: You and/or your care partner have signed paperwork which will be entered into your electronic medical record.  These signatures attest to the fact that that the information above on your After Visit Summary has been reviewed and is understood.  Full responsibility of the confidentiality of this discharge information lies with you and/or your care-partner.

## 2022-09-23 NOTE — Progress Notes (Signed)
VS completed by SS  Pt's states no medical or surgical changes since previsit or office visit.

## 2022-09-23 NOTE — Op Note (Signed)
[facility] Patient Name: [name] Procedure Date: 09/23/2022 1:56 PM MRN: [id number] Endoscopist: Beverley Fiedler , MD, [id number] Age: 77 Referring MD:  Date of Birth: January 02, 1946 Gender: Female Account #: [id number] Procedure:                Colonoscopy Indications:              Epigastric abdominal pain, Abdominal pain in the                            right upper quadrant, Incidental change in bowel                            habits noted, recent discovered ovarian mass by                            CT/MRI, last colonoscopy 2019 Medicines:                Monitored Anesthesia Care Procedure:                Pre-Anesthesia Assessment:                           - Prior to the procedure, a History and Physical                            was performed, and patient medications and                            allergies were reviewed. The patient's tolerance of                            previous anesthesia was also reviewed. The risks                            and benefits of the procedure and the sedation                            options and risks were discussed with the patient.                            All questions were answered, and informed consent                            was obtained. Prior Anticoagulants: The patient has                            taken Eliquis (apixaban), last dose was 2 days                            prior to procedure. ASA Grade Assessment: III - A                            patient with severe systemic disease. After  reviewing the risks and benefits, the patient was                            deemed in satisfactory condition to undergo the                            procedure.                           After obtaining informed consent, the colonoscope                            was passed under direct vision. Throughout the                            procedure, the patient's blood pressure, pulse, and                             oxygen saturations were monitored continuously. The                            [id number]-HQ190L Colonoscope [id number] was introduced                            through the anus and advanced to the terminal                            ileum. The colonoscopy was performed without                            difficulty. The patient tolerated the procedure                            well. The quality of the bowel preparation was                            good. The terminal ileum, ileocecal valve,                            appendiceal orifice, and rectum were photographed. Scope In: 2:24:05 PM Scope Out: 2:42:34 PM Scope Withdrawal Time: 0 hours 13 minutes 17 seconds  Total Procedure Duration: 0 hours 18 minutes 29 seconds  Findings:                 The digital rectal exam was normal.                           The terminal ileum appeared normal.                           Three sessile polyps were found in the ascending                            colon. The polyps were 3 to 5 mm in size. These  polyps were removed with a cold snare. Resection                            and retrieval were complete.                           A 6 mm polyp was found in the proximal transverse                            colon. The polyp was sessile. The polyp was removed                            with a cold snare. Resection and retrieval were                            complete.                           Multiple medium-mouthed and small-mouthed                            diverticula were found in the sigmoid colon,                            descending colon, proximal transverse colon and                            hepatic flexure. There was angulation and                            tortuosity of the left colon in association with                            the diverticular openings.                           The exam was otherwise without abnormality on                             direct and retroflexion views. Complications:            No immediate complications. Estimated Blood Loss:     Estimated blood loss was minimal. Impression:               - The examined portion of the ileum was normal.                           - Three 3 to 5 mm polyps in the ascending colon,                            removed with a cold snare. Resected and retrieved.                           - One 6 mm polyp in the proximal transverse colon,  removed with a cold snare. Resected and retrieved.                           - Severe diverticulosis in the sigmoid colon, in                            the descending colon with associated tortuosity and                            angulation. Mild diverticulosis in the proximal                            transverse colon and at the hepatic flexure.                           - The examination was otherwise normal on direct                            and retroflexion views. Recommendation:           - Patient has a contact number available for                            emergencies. The signs and symptoms of potential                            delayed complications were discussed with the                            patient. Return to normal activities tomorrow.                            Written discharge instructions were provided to the                            patient.                           - Resume previous diet.                           - Continue present medications. Resume oral iron.                            Follow Hgb and iron studies in response to oral                            iron therapy.                           - Await pathology results.                           - No recommendation at this time regarding repeat  colonoscopy.                           - Continuing evaluation planned regarding ovarian                            mass.                           - Resume  Eliquis (apixaban) at prior dose tomorrow.                            Refer to managing physician for further adjustment                            of therapy. Beverley Fiedler, MD 09/23/2022 2:53:03 PM This report has been signed electronically.

## 2022-09-24 ENCOUNTER — Telehealth: Payer: Self-pay

## 2022-09-24 NOTE — Telephone Encounter (Signed)
  Follow up Call-     09/23/2022    1:22 PM  Call back number  Post procedure Call Back phone  # [phone]  Permission to leave phone message Yes     Patient questions:  Do you have a fever, pain , or abdominal swelling? No. Pain Score  0 *  Have you tolerated food without any problems? Yes.    Have you been able to return to your normal activities? Yes.    Do you have any questions about your discharge instructions: Diet   No. Medications  No. Follow up visit  No.  Do you have questions or concerns about your Care? No.  Actions: * If pain score is 4 or above: No action needed, pain <4.

## 2022-09-26 ENCOUNTER — Encounter: Payer: Self-pay | Admitting: Internal Medicine

## 2022-09-27 DIAGNOSIS — N83201 Unspecified ovarian cyst, right side: Secondary | ICD-10-CM | POA: Diagnosis not present

## 2022-09-30 ENCOUNTER — Ambulatory Visit (INDEPENDENT_AMBULATORY_CARE_PROVIDER_SITE_OTHER): Payer: Medicare HMO

## 2022-09-30 DIAGNOSIS — E1165 Type 2 diabetes mellitus with hyperglycemia: Secondary | ICD-10-CM

## 2022-09-30 LAB — POCT GLYCOSYLATED HEMOGLOBIN (HGB A1C): Hemoglobin A1C: 7 % — AB (ref 4.0–5.6)

## 2022-09-30 NOTE — Progress Notes (Signed)
POCT A1c has been performed. Patient is having surgery on Thursday and needed a current A1c.. Please cosign note in Dr. Elvera Lennox absence.

## 2022-10-01 NOTE — Progress Notes (Signed)
Carelink Summary Report / Loop Recorder 

## 2022-10-03 ENCOUNTER — Ambulatory Visit: Payer: Self-pay

## 2022-10-03 DIAGNOSIS — I10 Essential (primary) hypertension: Secondary | ICD-10-CM | POA: Diagnosis not present

## 2022-10-03 DIAGNOSIS — M19012 Primary osteoarthritis, left shoulder: Secondary | ICD-10-CM | POA: Diagnosis not present

## 2022-10-03 DIAGNOSIS — G4733 Obstructive sleep apnea (adult) (pediatric): Secondary | ICD-10-CM | POA: Diagnosis not present

## 2022-10-03 DIAGNOSIS — Z9989 Dependence on other enabling machines and devices: Secondary | ICD-10-CM | POA: Diagnosis not present

## 2022-10-03 DIAGNOSIS — M12812 Other specific arthropathies, not elsewhere classified, left shoulder: Secondary | ICD-10-CM | POA: Diagnosis not present

## 2022-10-03 DIAGNOSIS — E119 Type 2 diabetes mellitus without complications: Secondary | ICD-10-CM | POA: Diagnosis not present

## 2022-10-03 DIAGNOSIS — M75122 Complete rotator cuff tear or rupture of left shoulder, not specified as traumatic: Secondary | ICD-10-CM | POA: Diagnosis not present

## 2022-10-03 DIAGNOSIS — I4891 Unspecified atrial fibrillation: Secondary | ICD-10-CM | POA: Diagnosis not present

## 2022-10-03 DIAGNOSIS — G8918 Other acute postprocedural pain: Secondary | ICD-10-CM | POA: Diagnosis not present

## 2022-10-03 DIAGNOSIS — Z7984 Long term (current) use of oral hypoglycemic drugs: Secondary | ICD-10-CM | POA: Diagnosis not present

## 2022-10-03 DIAGNOSIS — F418 Other specified anxiety disorders: Secondary | ICD-10-CM | POA: Diagnosis not present

## 2022-10-03 DIAGNOSIS — M75102 Unspecified rotator cuff tear or rupture of left shoulder, not specified as traumatic: Secondary | ICD-10-CM | POA: Diagnosis not present

## 2022-10-03 DIAGNOSIS — Z7901 Long term (current) use of anticoagulants: Secondary | ICD-10-CM | POA: Diagnosis not present

## 2022-10-03 HISTORY — PX: SHOULDER SURGERY: SHX246

## 2022-10-03 NOTE — Patient Outreach (Addendum)
  Care Coordination   10/03/2022 Name: [name] MRN: [id number] DOB: [birth year]   Care Coordination Outreach Attempts:  An unsuccessful telephone outreach was attempted for a scheduled appointment today. HIPAA compliant voice message left with call back phone number.  PCP office staff member Ardeth Sportsman reports receiving call from patient who states her husband is having surgery today therefore she is unable to keep telephone appointment with RNCM.  Follow Up Plan:  Additional outreach attempts will be made to offer the patient care coordination information and services.   Encounter Outcome:  No Answer   Care Coordination Interventions:  No, not indicated    George Ina [facility] Care Coordination [phone] direct line

## 2022-10-04 ENCOUNTER — Other Ambulatory Visit: Payer: Self-pay | Admitting: Internal Medicine

## 2022-10-04 DIAGNOSIS — I4891 Unspecified atrial fibrillation: Secondary | ICD-10-CM | POA: Diagnosis not present

## 2022-10-04 DIAGNOSIS — M19012 Primary osteoarthritis, left shoulder: Secondary | ICD-10-CM | POA: Diagnosis not present

## 2022-10-04 DIAGNOSIS — G4733 Obstructive sleep apnea (adult) (pediatric): Secondary | ICD-10-CM | POA: Diagnosis not present

## 2022-10-04 DIAGNOSIS — M75102 Unspecified rotator cuff tear or rupture of left shoulder, not specified as traumatic: Secondary | ICD-10-CM | POA: Diagnosis not present

## 2022-10-04 DIAGNOSIS — Z7901 Long term (current) use of anticoagulants: Secondary | ICD-10-CM | POA: Diagnosis not present

## 2022-10-04 DIAGNOSIS — F418 Other specified anxiety disorders: Secondary | ICD-10-CM | POA: Diagnosis not present

## 2022-10-04 DIAGNOSIS — I10 Essential (primary) hypertension: Secondary | ICD-10-CM | POA: Diagnosis not present

## 2022-10-04 DIAGNOSIS — E119 Type 2 diabetes mellitus without complications: Secondary | ICD-10-CM | POA: Diagnosis not present

## 2022-10-04 DIAGNOSIS — Z9989 Dependence on other enabling machines and devices: Secondary | ICD-10-CM | POA: Diagnosis not present

## 2022-10-04 DIAGNOSIS — Z7984 Long term (current) use of oral hypoglycemic drugs: Secondary | ICD-10-CM | POA: Diagnosis not present

## 2022-10-05 ENCOUNTER — Emergency Department
Admission: EM | Admit: 2022-10-05 | Discharge: 2022-10-06 | Disposition: A | Discharge status: Home / Self Care | Payer: Medicare HMO | Attending: Emergency Medicine | Admitting: Emergency Medicine

## 2022-10-05 ENCOUNTER — Emergency Department: Payer: Medicare HMO

## 2022-10-05 ENCOUNTER — Other Ambulatory Visit: Payer: Self-pay

## 2022-10-05 DIAGNOSIS — R918 Other nonspecific abnormal finding of lung field: Secondary | ICD-10-CM | POA: Diagnosis not present

## 2022-10-05 DIAGNOSIS — R002 Palpitations: Secondary | ICD-10-CM

## 2022-10-05 DIAGNOSIS — R0689 Other abnormalities of breathing: Secondary | ICD-10-CM | POA: Diagnosis not present

## 2022-10-05 DIAGNOSIS — R0602 Shortness of breath: Secondary | ICD-10-CM | POA: Diagnosis not present

## 2022-10-05 DIAGNOSIS — Z7901 Long term (current) use of anticoagulants: Secondary | ICD-10-CM | POA: Insufficient documentation

## 2022-10-05 DIAGNOSIS — R062 Wheezing: Secondary | ICD-10-CM | POA: Diagnosis not present

## 2022-10-05 DIAGNOSIS — R0609 Other forms of dyspnea: Secondary | ICD-10-CM | POA: Diagnosis not present

## 2022-10-05 DIAGNOSIS — Z96612 Presence of left artificial shoulder joint: Secondary | ICD-10-CM | POA: Diagnosis not present

## 2022-10-05 DIAGNOSIS — J9809 Other diseases of bronchus, not elsewhere classified: Secondary | ICD-10-CM | POA: Diagnosis not present

## 2022-10-05 DIAGNOSIS — I959 Hypotension, unspecified: Secondary | ICD-10-CM | POA: Diagnosis not present

## 2022-10-05 DIAGNOSIS — I10 Essential (primary) hypertension: Secondary | ICD-10-CM | POA: Diagnosis not present

## 2022-10-05 LAB — CBC WITH DIFFERENTIAL/PLATELET
Abs Immature Granulocytes: 0.04 10*3/uL (ref 0.00–0.07)
Basophils Absolute: 0 10*3/uL (ref 0.0–0.1)
Basophils Relative: 0 %
Eosinophils Absolute: 0.1 10*3/uL (ref 0.0–0.5)
Eosinophils Relative: 1 %
HCT: 28.3 % — ABNORMAL LOW (ref 36.0–46.0)
Hemoglobin: 9.3 g/dL — ABNORMAL LOW (ref 12.0–15.0)
Immature Granulocytes: 0 %
Lymphocytes Relative: 13 %
Lymphs Abs: 1.4 10*3/uL (ref 0.7–4.0)
MCH: 27.8 pg (ref 26.0–34.0)
MCHC: 32.9 g/dL (ref 30.0–36.0)
MCV: 84.7 fL (ref 80.0–100.0)
Monocytes Absolute: 0.7 10*3/uL (ref 0.1–1.0)
Monocytes Relative: 7 %
Neutro Abs: 7.9 10*3/uL — ABNORMAL HIGH (ref 1.7–7.7)
Neutrophils Relative %: 79 %
Platelets: 309 10*3/uL (ref 150–400)
RBC: 3.34 MIL/uL — ABNORMAL LOW (ref 3.87–5.11)
RDW: 14.3 % (ref 11.5–15.5)
WBC: 10.2 10*3/uL (ref 4.0–10.5)
nRBC: 0 % (ref 0.0–0.2)

## 2022-10-05 LAB — COMPREHENSIVE METABOLIC PANEL
ALT: 8 U/L (ref 0–44)
AST: 16 U/L (ref 15–41)
Albumin: 3.3 g/dL — ABNORMAL LOW (ref 3.5–5.0)
Alkaline Phosphatase: 73 U/L (ref 38–126)
Anion gap: 7 (ref 5–15)
BUN: 16 mg/dL (ref 8–23)
CO2: 22 mmol/L (ref 22–32)
Calcium: 8.6 mg/dL — ABNORMAL LOW (ref 8.9–10.3)
Chloride: 104 mmol/L (ref 98–111)
Creatinine, Ser: 0.9 mg/dL (ref 0.44–1.00)
GFR, Estimated: >60 mL/min (ref >60)
Glucose, Bld: 332 mg/dL — ABNORMAL HIGH (ref 70–99)
Potassium: 4.5 mmol/L (ref 3.5–5.1)
Sodium: 133 mmol/L — ABNORMAL LOW (ref 135–145)
Total Bilirubin: 0.3 mg/dL (ref 0.3–1.2)
Total Protein: 6.7 g/dL (ref 6.5–8.1)

## 2022-10-05 LAB — TROPONIN I (HIGH SENSITIVITY)
Troponin I (High Sensitivity): 9 ng/L (ref <18)
Troponin I (High Sensitivity): 8 ng/L (ref <18)

## 2022-10-05 MED ORDER — IOHEXOL 350 MG/ML SOLN
75.0000 mL | Freq: Once | INTRAVENOUS | Status: AC | PRN
  Administered 2022-10-05: 75 mL via INTRAVENOUS

## 2022-10-05 NOTE — ED Provider Notes (Signed)
[facility] Provider Note   Event Date/Time   First MD Initiated Contact with Patient 10/05/22 2132     (approximate) History  Shortness of Breath  HPI [name] is a 77 y.o. female with a stated past medical history of persistent atrial fibrillation on Eliquis however stopped over the last 3 days due to left shoulder surgery who presents complaining of acute onset shortness of breath with associated palpitations that began yesterday.  Patient denies any associated lower extremity edema.  Patient denies any significant chest pain at this time.  Patient only endorses shortness of breath ROS: Patient currently denies any vision changes, tinnitus, difficulty speaking, facial droop, sore throat, chest pain, abdominal pain, nausea/vomiting/diarrhea, dysuria, or weakness/numbness/paresthesias in any extremity   Physical Exam  Triage Vital Signs: ED Triage Vitals  Encounter Vitals Group     BP 10/05/22 2120 (!) 128/96     Systolic BP Percentile --      Diastolic BP Percentile --      Pulse Rate 10/05/22 2120 84     Resp 10/05/22 2120 (!) 21     Temp 10/05/22 2120 98.5 F (36.9 C)     Temp Source 10/05/22 2120 Oral     SpO2 10/05/22 2120 99 %     Weight 10/05/22 2118 180 lb (81.6 kg)     Height 10/05/22 2118 5\' 5"  (1.651 m)     Head Circumference --      Peak Flow --      Pain Score 10/05/22 2118 5     Pain Loc --      Pain Education --      Exclude from Growth Chart --    Most recent vital signs: Vitals:   10/05/22 2120  BP: (!) 128/96  Pulse: 84  Resp: (!) 21  Temp: 98.5 F (36.9 C)  SpO2: 99%   General: Awake, oriented x4. CV:  Good peripheral perfusion.  Resp:  Normal effort.  Abd:  No distention.  Other:  Elderly overweight Caucasian female laying in bed with the left shoulder in sling.  Mild stridor appreciated on auscultation of the neck ED Results / Procedures / Treatments  Labs (all labs ordered are listed, but only abnormal results  are displayed) Labs Reviewed - No data to display EKG ED ECG REPORT I, Merwyn Katos, the attending physician, personally viewed and interpreted this ECG. Date: 10/05/2022 EKG Time: 2120 Rate: 88 Rhythm: Atrial fibrillation QRS Axis: normal Intervals: normal ST/T Wave abnormalities: normal Narrative Interpretation: Atrial fibrillation no evidence of acute ischemia RADIOLOGY ED MD interpretation: Pending -Agree with radiology assessment Official radiology report(s): No results found. PROCEDURES: Critical Care performed: No Procedures MEDICATIONS ORDERED IN ED: Medications - No data to display IMPRESSION / MDM / ASSESSMENT AND PLAN / ED COURSE  I reviewed the triage vital signs and the nursing notes.                             The patient is on the cardiac monitor to evaluate for evidence of arrhythmia and/or significant heart rate changes. Patient's presentation is most consistent with acute presentation with potential threat to life or bodily function. The patient is suffering from shortness of breath, but the immediate cause is not apparent.  Potential causes considered include, but are not limited to, asthma or COPD, congestive heart failure, pulmonary embolism, pneumothorax, coronary syndrome, pneumonia, and pleural effusion.  Care of this patient  will be signed out to the oncoming physician at the end of my shift.  All pertinent patient information conveyed and all questions answered.  All further care and disposition decisions will be made by the oncoming physician.   FINAL CLINICAL IMPRESSION(S) / ED DIAGNOSES   Final diagnoses:  None   Rx / DC Orders   ED Discharge Orders     None      Note:  This document was prepared using Dragon voice recognition software and may include unintentional dictation errors.   Merwyn Katos, MD 10/07/22 [phone]

## 2022-10-05 NOTE — ED Provider Notes (Signed)
Contacted by CT scan staff regarding patient's reported allergy to IV contrast during CT exam.  I spoke with patient and patient's daughter at bedside who has stated that she has had multiple contrasted CT exams in the past without pretreatment without any reaction.  Patient states that she does not remember what the reaction was to IV contrast in the first place.   Merwyn Katos, MD 10/05/22 [phone]

## 2022-10-05 NOTE — ED Triage Notes (Signed)
Pt had shoulder surgery Thursday and now has exertional SOB and wheezing. Pt was on eliquis for afib but has stopped due to surgery and has not resumed dose yet. Pt also c/o chest pain worse with breathing.

## 2022-10-06 NOTE — ED Provider Notes (Signed)
Patient received in signout from Dr. Vicente Males pending CTA chest.  No evidence of acute PE.  Reassessed the patient and she is feeling better.  Discussed with her and her family regarding atelectasis, incentive spirometry use, expectant management and return precautions.  She is suitable for outpatient management.   Delton Prairie, MD 10/06/22 Burna Mortimer

## 2022-10-10 DIAGNOSIS — M19012 Primary osteoarthritis, left shoulder: Secondary | ICD-10-CM | POA: Diagnosis not present

## 2022-10-11 ENCOUNTER — Telehealth: Payer: Self-pay | Admitting: Internal Medicine

## 2022-10-11 ENCOUNTER — Telehealth: Payer: Self-pay

## 2022-10-11 MED ORDER — DICYCLOMINE HCL 10 MG PO CAPS: 10.0000 mg | ORAL_CAPSULE | Freq: Three times a day (TID) | ORAL | 0 refills | Status: DC

## 2022-10-11 MED ORDER — FERROUS SULFATE 325 (65 FE) MG PO TABS: 325.0000 mg | ORAL_TABLET | Freq: Every day | ORAL | Status: AC

## 2022-10-11 NOTE — Telephone Encounter (Signed)
Still having pc RUQ pain and defecation after  Has not restarted iron, either    Meds ordered this encounter  Medications   dicyclomine (BENTYL) 10 MG capsule    Sig: Take 1 capsule (10 mg total) by mouth 3 (three) times daily before meals.    Dispense:  90 capsule    Refill:  0   ferrous sulfate 325 (65 FE) MG tablet    Sig: Take 1 tablet (325 mg total) by mouth daily with breakfast.   She or husband will call back next week w/ update  Note ovarian mass is stable and no surgery planned

## 2022-10-11 NOTE — Telephone Encounter (Signed)
Inbound call from patient's spouse stating patient has been having sever abdominal pain. Requesting a cal back to discuss if anything could be done sooner than 8/29 office visit. Please advise, thank you.

## 2022-10-11 NOTE — Telephone Encounter (Signed)
Spoke with patient's husband & he stated patient is still having intermittent, abdominal pain & loose stools 30 minutes after meals. Pain is the same as when she was seen at [facility]. Currently taking pantoprazole as prescribed. She is scheduled to see Dr. Leone Payor on 10/23/22 at 10:30 am. Husband is aware I will route to Dr. Leone Payor for further recommendations. Discussed ED precautions as well if needed in the meantime. Last seen for endo colon on 09/23/22.

## 2022-10-11 NOTE — Telephone Encounter (Signed)
Transition Care Management Follow-up Telephone Call Date of discharge and from where: 10/06/2022 [facility] How have you been since you were released from the hospital? Patient stated she is feeling better. Any questions or concerns? No  Items Reviewed: Did the pt receive and understand the discharge instructions provided? Yes  Medications obtained and verified?  No medication prescribed Other? No  Any new allergies since your discharge? No  Dietary orders reviewed? Yes Do you have support at home? Yes   Follow up appointments reviewed:  PCP Hospital f/u appt confirmed?  Patient stated she has the number to call to make an appointment.  Scheduled to see  on  @ . Specialist Hospital f/u appt confirmed? Yes  Scheduled to see Sherie Don, NP on 10/24/2022 @ [facility] [place]. Are transportation arrangements needed? No  If their condition worsens, is the pt aware to call PCP or go to the Emergency Dept.? Yes Was the patient provided with contact information for the PCP's office or ED? Yes Was to pt encouraged to call back with questions or concerns? Yes   [facility]  [facility] Population Health Community Resource Care Guide   [email or web address]  ?? [phone]   Website: [email or web address]  [email or web address]

## 2022-10-14 ENCOUNTER — Telehealth: Payer: Self-pay

## 2022-10-14 NOTE — Telephone Encounter (Signed)
*  Gastro  Pharmacy Patient Advocate Encounter   Received notification from CoverMyMeds that prior authorization for Dicyclomine HCl 10MG  capsules  is required/requested.   Insurance verification completed.   The patient is insured through [employer] [facility] .   Per test claim: PA required; PA submitted to [employer] [facility] via CoverMyMeds Key/confirmation #/EOC [id number] Status is pending

## 2022-10-14 NOTE — Telephone Encounter (Signed)
Pharmacy Patient Advocate Encounter  Received notification from [employer] [facility] that Prior Authorization for Dicyclomine HCl 10MG  capsules  has been APPROVED from 10/14/2022 to 03/04/2023

## 2022-10-15 DIAGNOSIS — M19012 Primary osteoarthritis, left shoulder: Secondary | ICD-10-CM | POA: Diagnosis not present

## 2022-10-16 NOTE — Telephone Encounter (Signed)
Inbound call from patient husband stating abdominal pain has eased up sue to medication. Calling to provide update. Please advise.   Thank you

## 2022-10-16 NOTE — Telephone Encounter (Signed)
Pt husband Chanetta Marshall called in to give a symptom update to Dr. Leone Payor stating that the medication is helping, still has a little pain but stated that it is a lot better.

## 2022-10-17 DIAGNOSIS — M19012 Primary osteoarthritis, left shoulder: Secondary | ICD-10-CM | POA: Diagnosis not present

## 2022-10-21 ENCOUNTER — Ambulatory Visit (INDEPENDENT_AMBULATORY_CARE_PROVIDER_SITE_OTHER): Payer: Medicare HMO

## 2022-10-21 DIAGNOSIS — I4819 Other persistent atrial fibrillation: Secondary | ICD-10-CM

## 2022-10-21 DIAGNOSIS — M19012 Primary osteoarthritis, left shoulder: Secondary | ICD-10-CM | POA: Diagnosis not present

## 2022-10-22 LAB — CUP PACEART REMOTE DEVICE CHECK
Date Time Interrogation Session: 20240818231947
Implantable Pulse Generator Implant Date: 20210816

## 2022-10-23 ENCOUNTER — Other Ambulatory Visit: Payer: Medicare HMO

## 2022-10-23 ENCOUNTER — Ambulatory Visit: Payer: Medicare HMO | Admitting: Internal Medicine

## 2022-10-23 ENCOUNTER — Encounter: Payer: Self-pay | Admitting: Internal Medicine

## 2022-10-23 VITALS — BP 110/60 | HR 72 | Ht 65.0 in | Wt 177.1 lb

## 2022-10-23 DIAGNOSIS — R197 Diarrhea, unspecified: Secondary | ICD-10-CM | POA: Diagnosis not present

## 2022-10-23 DIAGNOSIS — M19012 Primary osteoarthritis, left shoulder: Secondary | ICD-10-CM | POA: Diagnosis not present

## 2022-10-23 DIAGNOSIS — K580 Irritable bowel syndrome with diarrhea: Secondary | ICD-10-CM | POA: Diagnosis not present

## 2022-10-23 MED ORDER — DICYCLOMINE HCL 20 MG PO TABS: 20.0000 mg | ORAL_TABLET | Freq: Three times a day (TID) | ORAL | 3 refills | Status: DC

## 2022-10-23 NOTE — Progress Notes (Unsigned)
Cardiology Office Note Date:  10/23/2022  Patient ID:  Emily, King [birth year], MRN [id number] PCP:  [name], MD  Cardiologist:  Lorine Bears, MD Electrophysiologist: Sherryl Manges, MD  ***refresh   Chief Complaint: [name] follow-up  History of Present Illness: [name] is a 77 y.o. female with PMH notable for persis afib, SVT, PACs, aflutter, CAD, T2DM, HTN, OSA; seen today for Sherryl Manges, MD for routine electrophysiology followup.  S/p afib ablation 2018 with multiple flutter circuits noted. She is s/p redo afib, typical and atypical flutter ablation with PVI, PWI, CVI on 04/2022. She saw Dr. Lalla Brothers 07/2022 for routine post-ablation appointment. ILR suspicious for Afib, though on further review was NSR w PACs and brief salvos of ATach. She was feeling well, having some increased fatigue.   On follow-up today,  *** AF burden, symptoms *** palpitations *** bleeding concerns  *** how taking tikosyn *** Bp readings at home  -- update labs today  Since last being seen in our clinic the patient reports doing ***.  she denies chest pain, palpitations, dyspnea, PND, orthopnea, nausea, vomiting, dizziness, syncope, edema, weight gain, or early satiety.     Device Information: MDT ILR imp 2021  AAD History: Flecainide, ineffective Tikosyn -   Past Medical History:  Diagnosis Date   Anemia    Anxiety    Atrial fibrillation (HCC)    Colon polyps    Constipation    Depressive disorder, not elsewhere classified    Diabetes mellitus without complication (HCC)    Diaphragmatic hernia without mention of obstruction or gangrene    Disorders of bursae and tendons in shoulder region, unspecified    Diverticulosis of colon (without mention of hemorrhage)    External hemorrhoid    Fundic gland polyps of stomach, benign    GERD (gastroesophageal reflux disease)    History of hiatal hernia    HTN (hypertension)    Hypertension    Hypothyroidism    Internal  hemorrhoids without mention of complication    Moderate mitral regurgitation    Obesity, unspecified    OSA on CPAP    compliant with CPAP   Other urinary incontinence    Persistent atrial fibrillation (HCC)    Pure hypercholesterolemia    Tubular adenoma of colon    Type II diabetes mellitus (HCC)    Urinary frequency    Urinary urgency    Vitamin B 12 deficiency     Past Surgical History:  Procedure Laterality Date   ABDOMINAL HYSTERECTOMY     ABDOMINAL HYSTERECTOMY  1972   Have both ovaries   ATRIAL FIBRILLATION ABLATION  12/17/2016   ATRIAL FIBRILLATION ABLATION N/A 12/17/2016   Procedure: ATRIAL FIBRILLATION ABLATION;  Surgeon: [name], MD;  [facility];  Service: Cardiovascular;  Laterality: N/A;   ATRIAL FIBRILLATION ABLATION N/A 04/23/2022   Procedure: ATRIAL FIBRILLATION ABLATION;  Surgeon: [name], MD;  [facility];  Service: Cardiovascular;  Laterality: N/A;   CARDIAC CATHETERIZATION  1990s   [facility]; "[name]"   CARDIOVERSION N/A 05/27/2016   Procedure: CARDIOVERSION;  Surgeon: [name], MD;  Location: [facility];  Service: Cardiovascular;  Laterality: N/A;   CARDIOVERSION N/A 11/25/2016   Procedure: CARDIOVERSION;  Surgeon: [name], MD;  Location: [facility];  Service: Cardiovascular;  Laterality: N/A;   CARDIOVERSION N/A 09/18/2018   Procedure: CARDIOVERSION;  Surgeon: [name], MD;  Location: [facility];  Service: Cardiovascular;  Laterality:  N/A;   CARDIOVERSION N/A 07/28/2019   Procedure: CARDIOVERSION;  Surgeon: [name], MD;  Location: [facility];  Service: Cardiovascular;  Laterality: N/A;   CARDIOVERSION N/A 09/07/2019   Procedure: CARDIOVERSION;  Surgeon: [name], MD;  Location: [facility];  Service: Cardiovascular;  Laterality: N/A;   CARDIOVERSION N/A 02/19/2022   Procedure: CARDIOVERSION;  Surgeon: [name], MD;  Location: [facility];  Service: Cardiovascular;   Laterality: N/A;   CATARACT EXTRACTION W/ INTRAOCULAR LENS  IMPLANT, BILATERAL Bilateral    COLONOSCOPY     ELECTROPHYSIOLOGIC STUDY N/A 01/24/2016   Procedure: CARDIOVERSION;  Surgeon: [name], MD;  Location: [facility];  Service: Cardiovascular;  Laterality: N/A;   implantable loop recorder implantation  10/18/2019   Medtronic Reveal [place] model [id number] [phone] G) implantable loop recorder    LAPAROSCOPIC CHOLECYSTECTOMY     never stimulator     for her bladder   REDUCTION MAMMAPLASTY Bilateral    SHOULDER SURGERY Left 10/03/2022   TONSILLECTOMY     TOTAL HIP ARTHROPLASTY Right 05/12/2018   Procedure: TOTAL HIP ARTHROPLASTY ANTERIOR APPROACH;  Surgeon: Durene Romans, MD;  Location: [facility];  Service: Orthopedics;  Laterality: Right;  70 mins    Current Outpatient Medications  Medication Instructions   Basaglar KwikPen 8-12 Units, Subcutaneous, Daily at bedtime   calcium carbonate (OS-CAL - DOSED IN MG OF ELEMENTAL CALCIUM) 1250 (500 Ca) MG tablet 1 tablet, Oral, 2 times weekly   dicyclomine (BENTYL) 20 mg, Oral, 3 times daily before meals   dofetilide (TIKOSYN) 250 mcg, Oral, 2 times daily   Eliquis 5 mg, Oral, 2 times daily   ferrous sulfate 325 mg, Oral, Daily with breakfast   glucose blood (ONETOUCH ULTRA) test strip CHECK BLOOD SUGAR 2 TIMES A DAY AS DIRECTED DX CODE E11.9   hydroxypropyl methylcellulose / hypromellose (ISOPTO TEARS / GONIOVISC) 2.5 % ophthalmic solution 1 drop, Both Eyes, 2 times daily PRN   Insulin Pen Needle 32G X 4 MM MISC Use 1x a day   magnesium oxide (MAG-OX) 400 mg, Oral, 2 times daily   metFORMIN (GLUCOPHAGE-XR) 500 MG 24 hr tablet TAKE TWO TABLETS BY MOUTH TWICE DAILY WITH MEALS   metoprolol succinate (TOPROL-XL) 25 MG 24 hr tablet Take one tablet by mouth at bedtime   Myrbetriq 50 mg, Oral, Daily PRN   oxyCODONE (OXY IR/ROXICODONE) 5 MG immediate release tablet Oral   sertraline (ZOLOFT) 100 mg, Oral, Daily at bedtime   traMADol (ULTRAM) 50 mg,  Oral, As needed   zolpidem (AMBIEN) 5 mg, Oral, Daily at bedtime    Social History:  The patient  reports that she has never smoked. She has never used smokeless tobacco. She reports that she does not drink alcohol and does not use drugs.   Family History:  *** include only if pertinent The patient's family history includes Cancer in her brother and mother; Dementia in her maternal aunt and mother; Diabetes in her brother and mother; Hyperlipidemia in her father and mother; Hypertension in her sister; Kidney cancer in her son; Lung cancer in her father; Pulmonary embolism in her mother; Stroke in her paternal grandfather and paternal grandmother.***  ROS:  Please see the history of present illness. All other systems are reviewed and otherwise negative.   PHYSICAL EXAM: *** VS:  LMP  (LMP Unknown)  BMI: There is no height or weight on file to calculate BMI.  GEN- The patient is well appearing, alert and oriented x 3 today.   Lungs- Clear to ausculation  bilaterally, normal work of breathing.  Heart- {Blank single:19197::"Regular","Irregularly irregular"} rate and rhythm, no murmurs, rubs or gallops Extremities- {EDEMA LEVEL:28147::"No"} peripheral edema, warm, dry Skin-  *** ILR insertion site well-healed, no tethering   Device interrogation done today and reviewed by myself:  Battery *** Lead thresholds, impedence, sensing stable *** *** episodes *** changes made today  EKG is ordered. Personal review of EKG from today shows:  ***        10/05/2022 - NSR w PAC, rate 88; QTc 425  Recent Labs: 02/18/2022: Magnesium 2.1 06/27/2022: TSH 2.23 10/05/2022: ALT 8; BUN 16; Creatinine, Ser 0.90; Hemoglobin 9.3; Platelets 309; Potassium 4.5; Sodium 133  10/24/2021: Cholesterol 147; HDL 57.70; LDL Cholesterol 73; Total CHOL/HDL Ratio 3; Triglycerides 79.0; VLDL 15.8   Estimated Creatinine Clearance: 56.5 mL/min (by C-G formula based on SCr of 0.9 mg/dL).   Wt Readings from Last 3 Encounters:   10/23/22 177 lb 2 oz (80.3 kg)  10/05/22 180 lb (81.6 kg)  09/23/22 181 lb (82.1 kg)     Additional studies reviewed include: Previous EP, cardiology notes.   AF ablation, 04/23/2022 1. Successful redo PVI (reisolation of the right and left veins) 2. Successful redo ablation/isolation of the posterior wall 3. Successful ablation of atypical (counterclockwise peri-mitral flutter) flutter with a RSPV-MV line 4. Successful redo ablation of the cavotricuspid isthmus for typical atrial flutter 5. Intracardiac echo reveals trivial pericardial effusion, dilated LA 6. No early apparent complications. 7. Colchicine 0.6mg  PO BID x 5 days 8. Protonix 40mg  PO daily x 45 days  TTE, 01/03/2022 1. Left ventricular ejection fraction, by estimation, is 55 to 60%. The left ventricle has normal function. The left ventricle has no regional wall motion abnormalities. Left ventricular diastolic parameters are consistent with Grade II diastolic dysfunction (pseudonormalization).   2. Right ventricular systolic function is normal. The right ventricular size is normal. There is normal pulmonary artery systolic pressure. The estimated right ventricular systolic pressure is 33.5 mmHg.   3. Left atrial size was severely dilated.   4. The mitral valve is normal in structure. Mild mitral valve regurgitation. No evidence of mitral stenosis.   5. The aortic valve is tricuspid. Aortic valve regurgitation is not visualized. No aortic stenosis is present.   6. The inferior vena cava is normal in size with greater than 50% respiratory variability, suggesting right atrial pressure of 3 mmHg.   Comparison(s): 2/22-EF 55-60%, MR.   ASSESSMENT AND PLAN:  #) parox AFib #) parox Aflutter #) atach #) MDT ILR in situ QTc *** on tdoay's EKG, stable from prior     #) Hypercoag d/t  afib CHA2DS2-VASc Score = 5 [CHF History: 0, HTN History: 1, Diabetes History: 1, Stroke History: 0, Vascular Disease History: 0, Age Score: 2,  Gender Score: 1].  Therefore, the patient's annual risk of stroke is 7.2 %.     {Confirm score is correct.  If not, click here to update score.  REFRESH note.  :1}   Stroke ppx - 5mg  eliquis BID, appropriately dosed No bleeding concerns  #) ***   {Are you ordering a CV Procedure (e.g. stress test, cath, DCCV, TEE, etc)?   Press F2        :[id number]}   Current medicines are reviewed at length with the patient today.   The patient {ACTIONS; HAS/DOES NOT HAVE:19233} concerns regarding her medicines.  The following changes were made today:  {NONE DEFAULTED:18576}  Labs/ tests ordered today include: *** No orders of the defined types  were placed in this encounter.    Disposition: Follow up with {EPMDS:28135} or EP APP {EPFOLLOW UP:28173}   Signed, Sherie Don, NP  10/23/22  3:17 PM  Electrophysiology [facility]

## 2022-10-23 NOTE — Patient Instructions (Signed)
Your provider has requested that you go to the basement level for lab work before leaving today. Press "B" on the elevator. The lab is located at the first door on the left as you exit the elevator.  Due to recent changes in healthcare laws, you may see the results of your imaging and laboratory studies on MyChart before your provider has had a chance to review them.  We understand that in some cases there may be results that are confusing or concerning to you. Not all laboratory results come back in the same time frame and the provider may be waiting for multiple results in order to interpret others.  Please give [name] 48 hours in order for your provider to thoroughly review all the results before contacting the office for clarification of your results.   Stop your Protonix.   Dr Leone Payor is changing your dicyclomine to 20mg  three times a day. Use up your 10mg  tablets that you have and then you can fill the printed rx we are giving you today for the 20mg  dicyclomine.   I appreciate the opportunity to care for you. Stan Head, MD, [facility]

## 2022-10-23 NOTE — Progress Notes (Signed)
[name] 77 y.o. 09-Aug-1945 [id number]  Assessment & Plan:   Encounter Diagnoses  Name Primary?   Irritable bowel syndrome with diarrhea Yes   Diarrhea, unspecified type    I think this is still most likely IBS.  She has had a response to the dicyclomine.  I am going to increase the dose as outlined below.  I am going to do serum 7 alpha C4 testing to see if she has postcholecystectomy bile salt malabsorption contributing to her diarrhea.  I am checking that as we could use cholestyramine or other bile acid sequestrant but it could be challenging with her medication regimen and concern for potential interference with medication absorption.   Meds ordered this encounter  Medications   dicyclomine (BENTYL) 20 MG tablet    Sig: Take 1 tablet (20 mg total) by mouth 3 (three) times daily before meals.    Dispense:  270 tablet    Refill:  3    Medications Discontinued During This Encounter  Medication Reason   pantoprazole (PROTONIX) 40 MG tablet    dicyclomine (BENTYL) 10 MG capsule     Orders Placed This Encounter  Procedures   [id number]      Subjective:   Chief Complaint: Follow-up of abdominal pain and diarrhea.  HPI The patient is a 77 year old white woman seen in July with right upper quadrant and epigastric pain, urgency with defecation and change in bowel habits and a right ovarian cyst and elevated lipase.  She underwent EGD and colonoscopy as outlined below.  She reports she is better on dicyclomine that I began.  She is on the 10 mg dose she still having some urgency with defecation but no incontinence.  Abdominal cramping is better.  She has followed up with gynecology and the right ovarian cyst is not thought to be a malignancy and no surgery is planned.   Colonoscopy 09/23/2022  - The examined portion of the ileum was normal.                           - Three 3 to 5 mm polyps in the ascending colon,                            removed with a cold snare.  Resected and retrieved.                           - One 6 mm polyp in the proximal transverse colon,                            removed with a cold snare. Resected and retrieved.                           - Severe diverticulosis in the sigmoid colon, in                            the descending colon with associated tortuosity and                            angulation. Mild diverticulosis in the proximal  transverse colon and at the hepatic flexure.                           - The examination was otherwise normal on direct                            and retroflexion views. EGD 09/23/2022 - LA Grade A (mild) reflux esophagitis.                           - 3 cm hiatal hernia.                           - Multiple gastric polyps. Biopsied. Polyps appear                            benign and endoscopically consistent with fundic                            gland polyps.                           - Normal examined duodenum. Not biopsied today as                            no evidence for celiac disease in 2019 by biopsy                            for IDA. 1. Surgical [P], gastric polyps - FUNDIC GLAND POLYPS. 2. Surgical [P], colon, ascending, polyp (3) - TUBULAR ADENOMA, FRAGMENTS. 3. Surgical [P], colon, transverse, polyp (1) - NONDIAGNOSTIC, FECAL MATERIAL ONLY.  Allergies  Allergen Reactions   Pravachol [Pravastatin Sodium] Other (See Comments)    Leg pain    Cortisone Rash    Cortisone injection NOT PREDNISONE >40 years ago.. caused rash in setting of treating poison ivy? If true reaction. Has tolerated prednisolone in past in form of eye drops   Current Meds  Medication Sig   apixaban (ELIQUIS) 5 MG TABS tablet TAKE ONE TABLET BY MOUTH TWICE DAILY   calcium carbonate (OS-CAL - DOSED IN MG OF ELEMENTAL CALCIUM) 1250 (500 Ca) MG tablet Take 1 tablet by mouth 2 (two) times a week.   dicyclomine (BENTYL) 20 MG tablet Take 1 tablet (20 mg total) by mouth 3  (three) times daily before meals.   dofetilide (TIKOSYN) 250 MCG capsule TAKE 1 CAPSULE BY MOUTH TWICE DAILY   ferrous sulfate 325 (65 FE) MG tablet Take 1 tablet (325 mg total) by mouth daily with breakfast.   glucose blood (ONETOUCH ULTRA) test strip CHECK BLOOD SUGAR 2 TIMES A DAY AS DIRECTED DX CODE E11.9   hydroxypropyl methylcellulose / hypromellose (ISOPTO TEARS / GONIOVISC) 2.5 % ophthalmic solution Place 1 drop into both eyes 2 (two) times daily as needed for dry eyes.   Insulin Glargine (BASAGLAR KWIKPEN) 100 UNIT/ML Inject 8-12 Units into the skin at bedtime.   Insulin Pen Needle 32G X 4 MM MISC Use 1x a day   magnesium oxide (MAG-OX) 400 MG tablet Take 1 tablet (400 mg total) by mouth 2 (two) times daily.   metFORMIN (GLUCOPHAGE-XR) 500  MG 24 hr tablet TAKE TWO TABLETS BY MOUTH TWICE DAILY WITH MEALS   MYRBETRIQ 50 MG TB24 tablet Take 50 mg by mouth daily as needed.   oxyCODONE (OXY IR/ROXICODONE) 5 MG immediate release tablet Take by mouth.   sertraline (ZOLOFT) 100 MG tablet TAKE 1 TABLET BY MOUTH NIGHTLY   traMADol (ULTRAM) 50 MG tablet Take 50 mg by mouth as needed.   zolpidem (AMBIEN) 10 MG tablet Take 5 mg by mouth at bedtime.   [DISCONTINUED] dicyclomine (BENTYL) 10 MG capsule Take 1 capsule (10 mg total) by mouth 3 (three) times daily before meals.   [DISCONTINUED] metoprolol succinate (TOPROL-XL) 25 MG 24 hr tablet Take one tablet by mouth at bedtime (Patient not taking: Reported on 10/24/2022)   Past Medical History:  Diagnosis Date   Anemia    Anxiety    Atrial fibrillation (HCC)    Colon polyps    Constipation    Depressive disorder, not elsewhere classified    Diabetes mellitus without complication (HCC)    Diaphragmatic hernia without mention of obstruction or gangrene    Disorders of bursae and tendons in shoulder region, unspecified    Diverticulosis of colon (without mention of hemorrhage)    External hemorrhoid    Fundic gland polyps of stomach, benign     GERD (gastroesophageal reflux disease)    History of hiatal hernia    HTN (hypertension)    Hypertension    Hypothyroidism    Internal hemorrhoids without mention of complication    Moderate mitral regurgitation    Obesity, unspecified    OSA on CPAP    compliant with CPAP   Other urinary incontinence    Persistent atrial fibrillation (HCC)    Pure hypercholesterolemia    Tubular adenoma of colon    Type II diabetes mellitus (HCC)    Urinary frequency    Urinary urgency    Vitamin B 12 deficiency    Past Surgical History:  Procedure Laterality Date   ABDOMINAL HYSTERECTOMY  1972   Have both ovaries   ATRIAL FIBRILLATION ABLATION  12/17/2016   ATRIAL FIBRILLATION ABLATION N/A 12/17/2016   Procedure: ATRIAL FIBRILLATION ABLATION;  Surgeon: [name], MD;  [facility];  Service: Cardiovascular;  Laterality: N/A;   ATRIAL FIBRILLATION ABLATION N/A 04/23/2022   Procedure: ATRIAL FIBRILLATION ABLATION;  Surgeon: [name], MD;  [facility];  Service: Cardiovascular;  Laterality: N/A;   CARDIAC CATHETERIZATION  1990s   [facility]; "[name]"   CARDIOVERSION N/A 05/27/2016   Procedure: CARDIOVERSION;  Surgeon: [name], MD;  Location: [facility];  Service: Cardiovascular;  Laterality: N/A;   CARDIOVERSION N/A 11/25/2016   Procedure: CARDIOVERSION;  Surgeon: [name], MD;  Location: [facility];  Service: Cardiovascular;  Laterality: N/A;   CARDIOVERSION N/A 09/18/2018   Procedure: CARDIOVERSION;  Surgeon: [name], MD;  Location: [facility];  Service: Cardiovascular;  Laterality: N/A;   CARDIOVERSION N/A 07/28/2019   Procedure: CARDIOVERSION;  Surgeon: [name], MD;  Location: [facility];  Service: Cardiovascular;  Laterality: N/A;   CARDIOVERSION N/A 09/07/2019   Procedure: CARDIOVERSION;  Surgeon: [name], MD;  Location: [facility];  Service: Cardiovascular;  Laterality: N/A;   CARDIOVERSION N/A 02/19/2022    Procedure: CARDIOVERSION;  Surgeon: [name], MD;  Location: [facility];  Service: Cardiovascular;  Laterality: N/A;   CATARACT EXTRACTION W/ INTRAOCULAR LENS  IMPLANT, BILATERAL Bilateral    COLONOSCOPY     ELECTROPHYSIOLOGIC STUDY N/A  01/24/2016   Procedure: CARDIOVERSION;  Surgeon: [name], MD;  Location: [facility];  Service: Cardiovascular;  Laterality: N/A;   implantable loop recorder implantation  10/18/2019   Medtronic Reveal [place] model [id number] [phone] G) implantable loop recorder    LAPAROSCOPIC CHOLECYSTECTOMY     never stimulator     for her bladder   REDUCTION MAMMAPLASTY Bilateral    SHOULDER SURGERY Left 10/03/2022   TONSILLECTOMY     TOTAL HIP ARTHROPLASTY Right 05/12/2018   Procedure: TOTAL HIP ARTHROPLASTY ANTERIOR APPROACH;  Surgeon: Durene Romans, MD;  Location: [facility];  Service: Orthopedics;  Laterality: Right;  70 mins   Social History   Social History Narrative   ** Merged History Encounter **       Exercise walks 3 times daily Lives with spouse in [place] - retired from employment 1 son and 1 daughter  Diet fruit, occ fast food, salads and lean meat  3 tea/day, no EtOH, no tobacco, no drugs    family history includes Cancer in her brother and mother; Dementia in her maternal aunt and mother; Diabetes in her brother and mother; Hyperlipidemia in her father and mother; Hypertension in her sister; Kidney cancer in her son; Lung cancer in her father; Pulmonary embolism in her mother; Stroke in her paternal grandfather and paternal grandmother.   Review of Systems As above  Objective:   Physical Exam BP 110/60   Pulse 72   Ht 5\' 5"  (1.651 m)   Wt 177 lb 2 oz (80.3 kg)   LMP  (LMP Unknown)   BMI 29.48 kg/m

## 2022-10-24 ENCOUNTER — Ambulatory Visit: Payer: Medicare HMO | Attending: Cardiology | Admitting: Cardiology

## 2022-10-24 ENCOUNTER — Encounter: Payer: Self-pay | Admitting: Cardiology

## 2022-10-24 ENCOUNTER — Telehealth: Payer: Self-pay | Admitting: Cardiovascular Disease

## 2022-10-24 VITALS — BP 110/50 | HR 71 | Ht 65.0 in | Wt 176.5 lb

## 2022-10-24 DIAGNOSIS — Z95818 Presence of other cardiac implants and grafts: Secondary | ICD-10-CM | POA: Diagnosis not present

## 2022-10-24 DIAGNOSIS — D649 Anemia, unspecified: Secondary | ICD-10-CM | POA: Diagnosis not present

## 2022-10-24 DIAGNOSIS — I471 Supraventricular tachycardia, unspecified: Secondary | ICD-10-CM | POA: Diagnosis not present

## 2022-10-24 DIAGNOSIS — I4819 Other persistent atrial fibrillation: Secondary | ICD-10-CM

## 2022-10-24 DIAGNOSIS — D6869 Other thrombophilia: Secondary | ICD-10-CM | POA: Diagnosis not present

## 2022-10-24 DIAGNOSIS — Z79899 Other long term (current) drug therapy: Secondary | ICD-10-CM

## 2022-10-24 MED ORDER — METOPROLOL SUCCINATE 25 MG PO CS24: 12.5000 mg | EXTENDED_RELEASE_CAPSULE | Freq: Every evening | ORAL | 3 refills | Status: DC

## 2022-10-24 MED ORDER — METOPROLOL SUCCINATE ER 25 MG PO TB24: 12.5000 mg | ORAL_TABLET | Freq: Every day | ORAL | 1 refills | Status: DC

## 2022-10-24 NOTE — Patient Instructions (Addendum)
Medication Instructions:  Decrease Metoprolol to 12.5 mg daily in the evening  *If you need a refill on your cardiac medications before your next appointment, please call your pharmacy*   Lab Work: Your provider would like for you to have following labs drawn today CBC, BMET, MAG.   If you have labs (blood work) drawn today and your tests are completely normal, you will receive your results only by: MyChart Message (if you have MyChart) OR A paper copy in the mail If you have any lab test that is abnormal or we need to change your treatment, we will call you to review the results.  Follow-Up: At [facility], you and your health needs are our priority.  As part of our continuing mission to provide you with exceptional heart care, we have created designated Provider Care Teams.  These Care Teams include your primary Cardiologist (physician) and Advanced Practice Providers (APPs -  Physician Assistants and Nurse Practitioners) who all work together to provide you with the care you need, when you need it.  We recommend signing up for the patient portal called "MyChart".  Sign up information is provided on this After Visit Summary.  MyChart is used to connect with patients for Virtual Visits (Telemedicine).  Patients are able to view lab/test results, encounter notes, upcoming appointments, etc.  Non-urgent messages can be sent to your provider as well.   To learn more about what you can do with MyChart, go to [email or web address].    Your next appointment:   4 month(s)  Provider:   Sherryl Manges, MD or Sherie Don, NP    Other Instructions Bring your blood pressure cuff to your next visit. Increase fluid and salt intake.

## 2022-10-24 NOTE — Telephone Encounter (Signed)
Updated RX for the tablet form of Metoprolol sent to pharmacy.

## 2022-10-24 NOTE — Addendum Note (Signed)
Addended by: Cydney Ok on: 10/24/2022 11:36 AM   Modules accepted: Orders

## 2022-10-24 NOTE — Telephone Encounter (Signed)
Pt c/o medication issue:  1. Name of Medication: Metoprolol Succinate 25 MG CS24   2. How are you currently taking this medication (dosage and times per day)? Take 0.5 capsules (12.5 mg total) by mouth every evening.   3. Are you having a reaction (difficulty breathing--STAT)? No  4. What is your medication issue? [facility] called stating they can not do the prescription as a capsule form. [facility] requested we rewrite the prescription for tablets.

## 2022-10-25 LAB — BASIC METABOLIC PANEL
BUN/Creatinine Ratio: 11 — ABNORMAL LOW (ref 12–28)
BUN: 13 mg/dL (ref 8–27)
CO2: 23 mmol/L (ref 20–29)
Calcium: 9.8 mg/dL (ref 8.7–10.3)
Chloride: 95 mmol/L — ABNORMAL LOW (ref 96–106)
Creatinine, Ser: 1.22 mg/dL — ABNORMAL HIGH (ref 0.57–1.00)
Glucose: 218 mg/dL — ABNORMAL HIGH (ref 70–99)
Potassium: 5 mmol/L (ref 3.5–5.2)
Sodium: 134 mmol/L (ref 134–144)
eGFR: 46 mL/min/{1.73_m2} — ABNORMAL LOW (ref >59)

## 2022-10-25 LAB — MAGNESIUM: Magnesium: 1.6 mg/dL (ref 1.6–2.3)

## 2022-10-25 LAB — CBC
Hematocrit: 30.9 % — ABNORMAL LOW (ref 34.0–46.6)
Hemoglobin: 9.7 g/dL — ABNORMAL LOW (ref 11.1–15.9)
MCH: 26.8 pg (ref 26.6–33.0)
MCHC: 31.4 g/dL — ABNORMAL LOW (ref 31.5–35.7)
MCV: 85 fL (ref 79–97)
Platelets: 602 10*3/uL — ABNORMAL HIGH (ref 150–450)
RBC: 3.62 x10E6/uL — ABNORMAL LOW (ref 3.77–5.28)
RDW: 13.4 % (ref 11.7–15.4)
WBC: 6.3 10*3/uL (ref 3.4–10.8)

## 2022-10-26 ENCOUNTER — Encounter: Payer: Self-pay | Admitting: Internal Medicine

## 2022-10-28 ENCOUNTER — Telehealth: Payer: Self-pay | Admitting: Internal Medicine

## 2022-10-28 DIAGNOSIS — M19012 Primary osteoarthritis, left shoulder: Secondary | ICD-10-CM | POA: Diagnosis not present

## 2022-10-28 NOTE — Telephone Encounter (Signed)
Inbound call from patient's spouse stating patient is not doing any better and the medication that was prescribed has not been helping. Requesting a call back to receive further recommendations. Please advise, thank you.

## 2022-10-29 ENCOUNTER — Ambulatory Visit (INDEPENDENT_AMBULATORY_CARE_PROVIDER_SITE_OTHER): Payer: Medicare HMO

## 2022-10-29 VITALS — Ht 65.0 in | Wt 178.0 lb

## 2022-10-29 DIAGNOSIS — Z1231 Encounter for screening mammogram for malignant neoplasm of breast: Secondary | ICD-10-CM

## 2022-10-29 DIAGNOSIS — Z780 Asymptomatic menopausal state: Secondary | ICD-10-CM

## 2022-10-29 DIAGNOSIS — Z0000 Encounter for general adult medical examination without abnormal findings: Secondary | ICD-10-CM

## 2022-10-29 NOTE — Progress Notes (Signed)
Subjective:   [name] is a 77 y.o. female who presents for Medicare Annual (Subsequent) preventive examination.  Visit Complete: Virtual  I connected with  [name] on 10/29/22 by a audio enabled telemedicine application and verified that I am speaking with the correct person using two identifiers.  Patient Location: Home  Provider Location: Home Office  I discussed the limitations of evaluation and management by telemedicine. The patient expressed understanding and agreed to proceed.  Vital Signs: Because this visit was a virtual/telehealth visit, some criteria may be missing or patient reported. Any vitals not documented were not able to be obtained and vitals that have been documented are patient reported.     Review of Systems      Cardiac Risk Factors include: advanced age (>85men, >81 women);sedentary lifestyle;hypertension;dyslipidemia;diabetes mellitus;obesity (BMI >30kg/m2)     Objective:    Today's Vitals   10/29/22 1303  Weight: 178 lb (80.7 kg)  Height: 5\' 5"  (1.651 m)  PainSc: 2    Body mass index is 29.62 kg/m.     10/29/2022    1:15 PM 10/05/2022    9:19 PM 04/23/2022    8:35 AM 02/19/2022    5:26 AM 08/31/2021    2:07 PM 05/09/2021   10:00 AM 04/12/2021   12:29 PM  Advanced Directives  Does Patient Have a Medical Advance Directive? Yes No Yes No Yes Yes Yes  Type of Estate agent of [place];Living will  Healthcare Power of [place];Living will  Healthcare Power of [place];Living will Living will;Healthcare Power of [employer] Power of Attorney  Does patient want to make changes to medical advance directive?      No - Patient declined No - Patient declined  Copy of Healthcare Power of Attorney in Chart? No - copy requested  No - copy requested  No - copy requested  No - copy requested    Current Medications (verified) Outpatient Encounter Medications as of 10/29/2022  Medication Sig   apixaban (ELIQUIS) 5 MG TABS  tablet TAKE ONE TABLET BY MOUTH TWICE DAILY   calcium carbonate (OS-CAL - DOSED IN MG OF ELEMENTAL CALCIUM) 1250 (500 Ca) MG tablet Take 1 tablet by mouth 2 (two) times a week.   dicyclomine (BENTYL) 20 MG tablet Take 1 tablet (20 mg total) by mouth 3 (three) times daily before meals.   dofetilide (TIKOSYN) 250 MCG capsule TAKE 1 CAPSULE BY MOUTH TWICE DAILY   ferrous sulfate 325 (65 FE) MG tablet Take 1 tablet (325 mg total) by mouth daily with breakfast.   glucose blood (ONETOUCH ULTRA) test strip CHECK BLOOD SUGAR 2 TIMES A DAY AS DIRECTED DX CODE E11.9   hydroxypropyl methylcellulose / hypromellose (ISOPTO TEARS / GONIOVISC) 2.5 % ophthalmic solution Place 1 drop into both eyes 2 (two) times daily as needed for dry eyes.   Insulin Glargine (BASAGLAR KWIKPEN) 100 UNIT/ML Inject 8-12 Units into the skin at bedtime.   Insulin Pen Needle 32G X 4 MM MISC Use 1x a day   magnesium oxide (MAG-OX) 400 MG tablet Take 1 tablet (400 mg total) by mouth 2 (two) times daily.   metFORMIN (GLUCOPHAGE-XR) 500 MG 24 hr tablet TAKE TWO TABLETS BY MOUTH TWICE DAILY WITH MEALS   metoprolol succinate (TOPROL XL) 25 MG 24 hr tablet Take 0.5 tablets (12.5 mg total) by mouth daily.   MYRBETRIQ 50 MG TB24 tablet Take 50 mg by mouth daily as needed.   sertraline (ZOLOFT) 100 MG tablet TAKE 1  TABLET BY MOUTH NIGHTLY   traMADol (ULTRAM) 50 MG tablet Take 50 mg by mouth as needed.   zolpidem (AMBIEN) 10 MG tablet Take 5 mg by mouth at bedtime.   oxyCODONE (OXY IR/ROXICODONE) 5 MG immediate release tablet Take by mouth. (Patient not taking: Reported on 10/29/2022)   No facility-administered encounter medications on file as of 10/29/2022.    Allergies (verified) Pravachol [pravastatin sodium] and Cortisone   History: Past Medical History:  Diagnosis Date   Anemia    Anxiety    Atrial fibrillation (HCC)    Colon polyps    Constipation    Depressive disorder, not elsewhere classified    Diabetes mellitus without  complication (HCC)    Diaphragmatic hernia without mention of obstruction or gangrene    Disorders of bursae and tendons in shoulder region, unspecified    Diverticulosis of colon (without mention of hemorrhage)    External hemorrhoid    Fundic gland polyps of stomach, benign    GERD (gastroesophageal reflux disease)    History of hiatal hernia    HTN (hypertension)    Hypertension    Hypothyroidism    Internal hemorrhoids without mention of complication    Moderate mitral regurgitation    Obesity, unspecified    OSA on CPAP    compliant with CPAP   Other urinary incontinence    Persistent atrial fibrillation (HCC)    Pure hypercholesterolemia    Tubular adenoma of colon    Type II diabetes mellitus (HCC)    Urinary frequency    Urinary urgency    Vitamin B 12 deficiency    Past Surgical History:  Procedure Laterality Date   ABDOMINAL HYSTERECTOMY  1972   Have both ovaries   ATRIAL FIBRILLATION ABLATION  12/17/2016   ATRIAL FIBRILLATION ABLATION N/A 12/17/2016   Procedure: ATRIAL FIBRILLATION ABLATION;  Surgeon: [name], MD;  Location: [facility];  Service: Cardiovascular;  Laterality: N/A;   ATRIAL FIBRILLATION ABLATION N/A 04/23/2022   Procedure: ATRIAL FIBRILLATION ABLATION;  Surgeon: [name], MD;  Location: [facility];  Service: Cardiovascular;  Laterality: N/A;   CARDIAC CATHETERIZATION  1990s   [facility]; "[name]"   CARDIOVERSION N/A 05/27/2016   Procedure: CARDIOVERSION;  Surgeon: [name], MD;  Location: [facility];  Service: Cardiovascular;  Laterality: N/A;   CARDIOVERSION N/A 11/25/2016   Procedure: CARDIOVERSION;  Surgeon: [name], MD;  Location: [facility];  Service: Cardiovascular;  Laterality: N/A;   CARDIOVERSION N/A 09/18/2018   Procedure: CARDIOVERSION;  Surgeon: [name], MD;  Location: [facility];  Service: Cardiovascular;  Laterality: N/A;   CARDIOVERSION N/A 07/28/2019   Procedure: CARDIOVERSION;   Surgeon: [name], MD;  Location: [facility];  Service: Cardiovascular;  Laterality: N/A;   CARDIOVERSION N/A 09/07/2019   Procedure: CARDIOVERSION;  Surgeon: [name], MD;  Location: [facility];  Service: Cardiovascular;  Laterality: N/A;   CARDIOVERSION N/A 02/19/2022   Procedure: CARDIOVERSION;  Surgeon: [name], MD;  Location: [facility];  Service: Cardiovascular;  Laterality: N/A;   CATARACT EXTRACTION W/ INTRAOCULAR LENS  IMPLANT, BILATERAL Bilateral    COLONOSCOPY     ELECTROPHYSIOLOGIC STUDY N/A 01/24/2016   Procedure: CARDIOVERSION;  Surgeon: [name], MD;  Location: [facility];  Service: Cardiovascular;  Laterality: N/A;   implantable loop recorder implantation  10/18/2019   Medtronic Reveal Cressey model UEA54 ([id number]) implantable loop recorder    LAPAROSCOPIC CHOLECYSTECTOMY     never stimulator  for her bladder   REDUCTION MAMMAPLASTY Bilateral    SHOULDER SURGERY Left 10/03/2022   TONSILLECTOMY     TOTAL HIP ARTHROPLASTY Right 05/12/2018   Procedure: TOTAL HIP ARTHROPLASTY ANTERIOR APPROACH;  Surgeon: Durene Romans, MD;  Location: [facility];  Service: Orthopedics;  Laterality: Right;  70 mins   Family History  Problem Relation Age of Onset   Cancer Mother        fallopian tube   Dementia Mother    Pulmonary embolism Mother    Hyperlipidemia Mother    Diabetes Mother    Hyperlipidemia Father    Lung cancer Father    Hypertension Sister    Diabetes Brother    Cancer Brother    Stroke Paternal Grandmother    Stroke Paternal Grandfather    Kidney cancer Son    Dementia Maternal Aunt    Colon cancer Neg Hx    Esophageal cancer Neg Hx    Social History   Socioeconomic History   Marital status: Married    Spouse name: Chanetta Marshall   Number of children: 2   Years of education: Not on file   Highest education level: Not on file  Occupational History   Occupation: retired  Tobacco Use   Smoking status: Never   Smokeless tobacco: Never    Tobacco comments:    Never smoke 08/30/21  Vaping Use   Vaping status: Never Used  Substance and Sexual Activity   Alcohol use: Never   Drug use: No   Sexual activity: Yes  Other Topics Concern   Not on file  Social History Narrative   ** Merged History Encounter **       Exercise walks 3 times daily Lives with spouse in [place] - retired from employment 1 son and 1 daughter  Diet fruit, occ fast food, salads and lean meat  3 tea/day, no EtOH, no tobacco, no drugs    Social Determinants of Health   Financial Resource Strain: Low Risk  (10/29/2022)   Overall Financial Resource Strain (CARDIA)    Difficulty of Paying Living Expenses: Not hard at all  Food Insecurity: No Food Insecurity (10/29/2022)   Hunger Vital Sign    Worried About Running Out of Food in the Last Year: Never true    Ran Out of Food in the Last Year: Never true  Transportation Needs: No Transportation Needs (10/29/2022)   PRAPARE - Administrator, Civil Service (Medical): No    Lack of Transportation (Non-Medical): No  Physical Activity: Inactive (10/29/2022)   Exercise Vital Sign    Days of Exercise per Week: 0 days    Minutes of Exercise per Session: 0 min  Stress: No Stress Concern Present (10/29/2022)   [employer] of Occupational Health - Occupational Stress Questionnaire    Feeling of Stress : Not at all  Social Connections: Moderately Integrated (10/29/2022)   Social Connection and Isolation Panel [NHANES]    Frequency of Communication with Friends and Family: More than three times a week    Frequency of Social Gatherings with Friends and Family: More than three times a week    Attends Religious Services: More than 4 times per year    Active Member of [employer] or Organizations: No    Attends Banker Meetings: Never    Marital Status: Married    Tobacco Counseling Counseling given: Not Answered Tobacco comments: Never smoke 08/30/21   Clinical  Intake:  Pre-visit preparation completed: Yes  Pain : 0-10  Pain Score: 2  Pain Type: Acute pain Pain Location: Shoulder Pain Orientation: Left Pain Descriptors / Indicators: Aching (s/p shoulder surgery) Pain Onset: 1 to 4 weeks ago     BMI - recorded: 29.62 Nutritional Status: BMI 25 -29 Overweight Nutritional Risks: Nausea/ vomitting/ diarrhea (occasionally has loose stools) Diabetes: Yes (140 per pt) CBG done?: No Did pt. bring in CBG monitor from home?: No  How often do you need to have someone help you when you read instructions, pamphlets, or other written materials from your doctor or pharmacy?: 1 - Never  Interpreter Needed?: No  Information entered by :: C.Taviana Westergren LPN   Activities of Daily Living    10/29/2022    1:16 PM  In your present state of health, do you have any difficulty performing the following activities:  Hearing? 0  Vision? 0  Difficulty concentrating or making decisions? 0  Walking or climbing stairs? 0  Dressing or bathing? 0  Doing errands, shopping? 0  Preparing Food and eating ? N  Using the Toilet? N  In the past six months, have you accidently leaked urine? Y  Comment on medication  Do you have problems with loss of bowel control? N  Managing your Medications? N  Managing your Finances? N  Housekeeping or managing your Housekeeping? N    Patient Care Team: [name], MD as PCP - General (Family Medicine) [name], MD as PCP - Cardiology (Cardiology) Duke Salvia, MD as PCP - Electrophysiology (Cardiology) Carlus Pavlov, MD as Consulting Physician (Internal Medicine) [name], MD as Consulting Physician (Cardiology) Merwyn Katos, MD (Inactive) as Consulting Physician (Pulmonary Disease) [name], MD Otho Ket, RN as Case Manager  Indicate any recent Medical Services you may have received from other than [facility] providers in the past year (date may be approximate).     Assessment:    This is a routine wellness examination for John.  Hearing/Vision screen Hearing Screening - Comments:: Denies hearing difficulties   Vision Screening - Comments:: Readers - [name] - It is time for exam Pt will call for appt.   Dietary issues and exercise activities discussed:     Goals Addressed             This Visit's Progress    Patient Stated       Be well enough to be able to go and do things on my own again.       Depression Screen    10/29/2022    1:08 PM 06/27/2022    9:39 AM 03/05/2022    9:34 AM 11/27/2021    9:52 AM 10/26/2021    9:23 AM 08/31/2021    2:02 PM 09/08/2020   11:43 AM  PHQ 2/9 Scores  PHQ - 2 Score 0 2 2 3 1 1 1   PHQ- 9 Score  8 8 12 14 1      Fall Risk    10/29/2022    1:16 PM 08/31/2021    1:59 PM 03/13/2021    9:35 AM 09/08/2020   11:39 AM 08/29/2020   11:39 AM  Fall Risk   Falls in the past year? 0 0 1 1 0  Number falls in past yr: 0 0 1 1 0  Injury with Fall? 0 0 1 1 0  Risk for fall due to : No Fall Risks No Fall Risks   Medication side effect  Follow up Falls prevention discussed;Falls evaluation completed Falls prevention discussed  Falls evaluation completed;Falls prevention discussed    MEDICARE RISK AT HOME: Medicare Risk at Home Any stairs in or around the home?: Yes If so, are there any without handrails?: No Home free of loose throw rugs in walkways, pet beds, electrical cords, etc?: Yes Adequate lighting in your home to reduce risk of falls?: Yes Life alert?: No Use of a cane, walker or w/c?: No Grab bars in the bathroom?: Yes Shower chair or bench in shower?: Yes Elevated toilet seat or a handicapped toilet?: No  TIMED UP AND GO:  Was the test performed?  No    Cognitive Function:    08/29/2020   11:41 AM 08/27/2019   11:26 AM 08/17/2018   11:01 AM 05/26/2017   10:29 AM 05/10/2016    1:40 PM  MMSE - Mini Mental State Exam  Orientation to time 5 5 5 5 5   Orientation to Place 5 5 5 5 5   Registration 3 3 3 3 3    Attention/ Calculation 5 5 0 0 0  Recall 3 3 3 3 2   Recall-comments     pt was unable to recall 1 of 3 words  Language- name 2 objects   0 0 0  Language- repeat 1 1 1 1 1   Language- follow 3 step command   0 3 3  Language- read & follow direction   0 0 0  Write a sentence   0 0 0  Copy design   0 0 0  Total score   17 20 19         10/29/2022    1:17 PM 08/31/2021    2:04 PM  6CIT Screen  What Year? 0 points 0 points  What month? 0 points 0 points  What time? 0 points 0 points  Count back from 20 0 points 0 points  Months in reverse 0 points 0 points  Repeat phrase 2 points 0 points  Total Score 2 points 0 points    Immunizations Immunization History  Administered Date(s) Administered   Fluad Quad(high Dose 65+) 11/17/2018   Influenza Split 01/03/2011, 01/17/2012   Influenza Whole 11/25/2007, 01/18/2009, 11/28/2009   Influenza, High Dose Seasonal PF 12/19/2016, 12/01/2017   Influenza,inj,Quad PF,6+ Mos 01/06/2013, 12/07/2013, 12/14/2014, 11/24/2015, 02/10/2020   Pneumococcal Conjugate-13 05/11/2013   Pneumococcal Polysaccharide-23 01/17/2012, 05/26/2017   Td 04/10/2010, 10/20/2020   Zoster, Live 05/13/2014    TDAP status: Up to date  Flu Vaccine status: Due, Education has been provided regarding the importance of this vaccine. Advised may receive this vaccine at local pharmacy or Health Dept. Aware to provide a copy of the vaccination record if obtained from local pharmacy or Health Dept. Verbalized acceptance and understanding.  Pneumococcal vaccine status: Up to date  Covid-19 vaccine status: Declined, Education has been provided regarding the importance of this vaccine but patient still declined. Advised may receive this vaccine at local pharmacy or Health Dept.or vaccine clinic. Aware to provide a copy of the vaccination record if obtained from local pharmacy or Health Dept. Verbalized acceptance and understanding.  Qualifies for Shingles Vaccine? Yes   Zostavax  completed No   Shingrix Completed?: No.    Education has been provided regarding the importance of this vaccine. Patient has been advised to call insurance company to determine out of pocket expense if they have not yet received this vaccine. Advised may also receive vaccine at local pharmacy or Health Dept. Verbalized acceptance and understanding.  Screening Tests Health Maintenance  Topic Date Due  Zoster Vaccines- Shingrix (1 of 2) 03/20/1964   MAMMOGRAM  09/11/2022   INFLUENZA VACCINE  10/03/2022   OPHTHALMOLOGY EXAM  10/23/2022   Diabetic kidney evaluation - Urine ACR  11/07/2022   COVID-19 Vaccine (1) 11/14/2022 (Originally 03/20/1950)   HEMOGLOBIN A1C  04/02/2023   FOOT EXAM  08/26/2023   Diabetic kidney evaluation - eGFR measurement  10/24/2023   Medicare Annual Wellness (AWV)  10/29/2023   DEXA SCAN  08/30/2025   Colonoscopy  09/22/2025   DTaP/Tdap/Td (3 - Tdap) 10/21/2030   Pneumonia Vaccine 64+ Years old  Completed   Hepatitis C Screening  Completed   HPV VACCINES  Aged Out    Health Maintenance  Health Maintenance Due  Topic Date Due   Zoster Vaccines- Shingrix (1 of 2) 03/20/1964   MAMMOGRAM  09/11/2022   INFLUENZA VACCINE  10/03/2022   OPHTHALMOLOGY EXAM  10/23/2022   Diabetic kidney evaluation - Urine ACR  11/07/2022    Colorectal cancer screening: Type of screening: Colonoscopy. Completed 09/23/22. Repeat every 3 years  Mammogram status: Ordered 10/29/22. Pt provided with contact info and advised to call to schedule appt.   Bone Density status: Ordered 10/29/22. Pt provided with contact info and advised to call to schedule appt.  Lung Cancer Screening: (Low Dose CT Chest recommended if Age 22-80 years, 20 pack-year currently smoking OR have quit w/in 15years.) does not qualify.   Lung Cancer Screening Referral:    Additional Screening:  Hepatitis C Screening: does qualify; Completed 05/09/15  Vision Screening: Recommended annual ophthalmology exams for  early detection of glaucoma and other disorders of the eye. Is the patient up to date with their annual eye exam?  No  p is 3 weeks post op but will call for appt. Who is the provider or what is the name of the office in which the patient attends annual eye exams? [name] If pt is not established with a provider, would they like to be referred to a provider to establish care? Yes .   Dental Screening: Recommended annual dental exams for proper oral hygiene  Diabetic Foot Exam: Diabetic Foot Exam: Completed 08/26/22  Community Resource Referral / Chronic Care Management: CRR required this visit?  No   CCM required this visit?  No     Plan:     I have personally reviewed and noted the following in the patient's chart:   Medical and social history Use of alcohol, tobacco or illicit drugs  Current medications and supplements including opioid prescriptions. Patient is currently taking opioid prescriptions. Information provided to patient regarding non-opioid alternatives. Patient advised to discuss non-opioid treatment plan with their provider. Functional ability and status Nutritional status Physical activity Advanced directives List of other physicians Hospitalizations, surgeries, and ER visits in previous 12 months Vitals Screenings to include cognitive, depression, and falls Referrals and appointments  In addition, I have reviewed and discussed with patient certain preventive protocols, quality metrics, and best practice recommendations. A written personalized care plan for preventive services as well as general preventive health recommendations were provided to patient.     Maryan Puls, LPN   1/61/0960   After Visit Summary: (MyChart) Due to this being a telephonic visit, the after visit summary with patients personalized plan was offered to patient via MyChart   Nurse Notes: none

## 2022-10-29 NOTE — Telephone Encounter (Signed)
Pt husband Chanetta Marshall  stated that the pt  is still having the abdominal pain along with Diarrhea mostly in the mornings and around lunch. Chanetta Marshall stated that the pt has been taking the Dicyclomine 3 times daily since 10/23/2022. Please review and advise.

## 2022-10-29 NOTE — Patient Instructions (Addendum)
[name] , Thank you for taking time to come for your Medicare Wellness Visit. I appreciate your ongoing commitment to your health goals. Please review the following plan we discussed and let me know if I can assist you in the future.   Referrals/Orders/Follow-Ups/Clinician Recommendations: Aim for 30 minutes of exercise or brisk walking, 6-8 glasses of water, and 5 servings of fruits and vegetables each day.   You have an order for:  []   2D Mammogram  [x]   3D Mammogram  [x]   Bone Density     Please call for appointment:  [facility] [address] [place], [place] [address] [phone]  [facility] [address] [place], [place] [address] [phone]  [facility] [address] [place], [place] [address] [phone]  [facility] [place] [facility] [address] [place], [place] [address] [phone]  [facility] [address] [place], [place] [address] [phone]    Make sure to wear two-piece clothing.  No lotions, powders, or deodorants the day of the appointment. Make sure to bring picture ID and insurance card.  Bring list of medications you are currently taking including any supplements.   Schedule your [facility] screening mammogram through MyChart!   Log into your MyChart account.  Go to 'Visit' (or 'Appointments' if on mobile App) --> Schedule an Appointment  Under 'Select a Reason for Visit' choose the Mammogram Screening option.  Complete the pre-visit questions and select the time and place that best fits your schedule.    Managing Pain Without Opioids Opioids are strong medicines used to treat moderate to severe pain. For some people, especially those who have long-term (chronic) pain, opioids may not be the best choice for pain management due to: Side effects like nausea, constipation, and sleepiness. The risk of addiction  (opioid use disorder). The longer you take opioids, the greater your risk of addiction. Pain that lasts for more than 3 months is called chronic pain. Managing chronic pain usually requires more than one approach and is often provided by a team of health care providers working together (multidisciplinary approach). Pain management may be done at a pain management center or pain clinic. How to manage pain without the use of opioids Use non-opioid medicines Non-opioid medicines for pain may include: Over-the-counter or prescription non-steroidal anti-inflammatory drugs (NSAIDs). These may be the first medicines used for pain. They work well for muscle and bone pain, and they reduce swelling. Acetaminophen. This over-the-counter medicine may work well for milder pain but not swelling. Antidepressants. These may be used to treat chronic pain. A certain type of antidepressant (tricyclics) is often used. These medicines are given in lower doses for pain than when used for depression. Anticonvulsants. These are usually used to treat seizures but may also reduce nerve (neuropathic) pain. Muscle relaxants. These relieve pain caused by sudden muscle tightening (spasms). You may also use a pain medicine that is applied to the skin as a patch, cream, or gel (topical analgesic), such as a numbing medicine. These may cause fewer side effects than medicines taken by mouth. Do certain therapies as directed Some therapies can help with pain management. They include: Physical therapy. You will do exercises to gain strength and flexibility. A physical therapist may teach you exercises to move and stretch parts of your body that are weak, stiff, or painful. You can learn these exercises at physical therapy visits and practice  them at home. Physical therapy may also involve: Massage. Heat wraps or applying heat or cold to affected areas. Electrical signals that interrupt pain signals (transcutaneous electrical nerve  stimulation, TENS). Weak lasers that reduce pain and swelling (low-level laser therapy). Signals from your body that help you learn to regulate pain (biofeedback). Occupational therapy. This helps you to learn ways to function at home and work with less pain. Recreational therapy. This involves trying new activities or hobbies, such as a physical activity or drawing. Mental health therapy, including: Cognitive behavioral therapy (CBT). This helps you learn coping skills for dealing with pain. Acceptance and commitment therapy (ACT) to change the way you think and react to pain. Relaxation therapies, including muscle relaxation exercises and mindfulness-based stress reduction. Pain management counseling. This may be individual, family, or group counseling.  Receive medical treatments Medical treatments for pain management include: Nerve block injections. These may include a pain blocker and anti-inflammatory medicines. You may have injections: Near the spine to relieve chronic back or neck pain. Into joints to relieve back or joint pain. Into nerve areas that supply a painful area to relieve body pain. Into muscles (trigger point injections) to relieve some painful muscle conditions. A medical device placed near your spine to help block pain signals and relieve nerve pain or chronic back pain (spinal cord stimulation device). Acupuncture. Follow these instructions at home Medicines Take over-the-counter and prescription medicines only as told by your health care provider. If you are taking pain medicine, ask your health care providers about possible side effects to watch out for. Do not drive or use heavy machinery while taking prescription opioid pain medicine. Lifestyle  Do not use drugs or alcohol to reduce pain. If you drink alcohol, limit how much you have to: 0-1 drink a day for women who are not pregnant. 0-2 drinks a day for men. Know how much alcohol is in a drink. In the [place]  one drink equals one 12 oz bottle of beer (355 mL), one 5 oz glass of wine (148 mL), or one 1 oz glass of hard liquor (44 mL). Do not use any products that contain nicotine or tobacco. These products include cigarettes, chewing tobacco, and vaping devices, such as e-cigarettes. If you need help quitting, ask your health care provider. Eat a healthy diet and maintain a healthy weight. Poor diet and excess weight may make pain worse. Eat foods that are high in fiber. These include fresh fruits and vegetables, whole grains, and beans. Limit foods that are high in fat and processed sugars, such as fried and sweet foods. Exercise regularly. Exercise lowers stress and may help relieve pain. Ask your health care provider what activities and exercises are safe for you. If your health care provider approves, join an exercise class that combines movement and stress reduction. Examples include yoga and tai chi. Get enough sleep. Lack of sleep may make pain worse. Lower stress as much as possible. Practice stress reduction techniques as told by your therapist. General instructions Work with all your pain management providers to find the treatments that work best for you. You are an important member of your pain management team. There are many things you can do to reduce pain on your own. Consider joining an online or in-person support group for people who have chronic pain. Keep all follow-up visits. This is important. Where to find more information You can find more information about managing pain without opioids from: American Academy of Pain Medicine: painmed.org Institute for  Chronic Pain: instituteforchronicpain.org American Chronic Pain Association: theacpa.org Contact a health care provider if: You have side effects from pain medicine. Your pain gets worse or does not get better with treatments or home therapy. You are struggling with anxiety or depression. Summary Many types of pain can be  managed without opioids. Chronic pain may respond better to pain management without opioids. Pain is best managed when you and a team of health care providers work together. Pain management without opioids may include non-opioid medicines, medical treatments, physical therapy, mental health therapy, and lifestyle changes. Tell your health care providers if your pain gets worse or is not being managed well enough. This information is not intended to replace advice given to you by your health care provider. Make sure you discuss any questions you have with your health care provider. Document Revised: 05/31/2020 Document Reviewed: 05/31/2020 Elsevier Patient Education  2024 Elsevier Inc.   This is a list of the screening recommended for you and due dates:  Health Maintenance  Topic Date Due   COVID-19 Vaccine (1) Never done   Zoster (Shingles) Vaccine (1 of 2) 03/20/1964   Medicare Annual Wellness Visit  09/01/2022   Mammogram  09/11/2022   Flu Shot  10/03/2022   Eye exam for diabetics  10/23/2022   Yearly kidney health urinalysis for diabetes  11/07/2022   Hemoglobin A1C  04/02/2023   Complete foot exam   08/26/2023   Yearly kidney function blood test for diabetes  10/24/2023   DEXA scan (bone density measurement)  08/30/2025   Colon Cancer Screening  09/22/2025   DTaP/Tdap/Td vaccine (3 - Tdap) 10/21/2030   Pneumonia Vaccine  Completed   Hepatitis C Screening  Completed   HPV Vaccine  Aged Out    Advanced directives: (Copy Requested) Please bring a copy of your health care power of attorney and living will to the office to be added to your chart at your convenience.  Next Medicare Annual Wellness Visit scheduled for next year: Yes

## 2022-10-30 DIAGNOSIS — M19012 Primary osteoarthritis, left shoulder: Secondary | ICD-10-CM | POA: Diagnosis not present

## 2022-10-30 NOTE — Telephone Encounter (Signed)
Pt husband Chanetta Marshall made aware of Dr. Leone Payor recommendations. Jimmy verbalized understanding with all questions answered.

## 2022-10-30 NOTE — Telephone Encounter (Signed)
I am waiting on a blood test result to make any changes. It is still pending.  She can try taking dicyclomine at bedtime also.

## 2022-10-31 ENCOUNTER — Ambulatory Visit: Payer: Medicare HMO | Admitting: Internal Medicine

## 2022-10-31 NOTE — Addendum Note (Signed)
Addended by: Pollie Meyer on: 10/31/2022 10:15 AM   Modules accepted: Orders

## 2022-11-01 NOTE — Progress Notes (Signed)
Carelink Summary Report / Loop Recorder 

## 2022-11-02 ENCOUNTER — Other Ambulatory Visit: Payer: Self-pay | Admitting: Cardiology

## 2022-11-02 LAB — 7ALPHAC4: 7AlphaC4: 93 ng/mL

## 2022-11-05 ENCOUNTER — Telehealth: Payer: Self-pay | Admitting: Internal Medicine

## 2022-11-05 MED ORDER — PREVALITE 4 G PO PACK: 4.0000 g | PACK | Freq: Every day | ORAL | 3 refills | Status: DC

## 2022-11-05 NOTE — Telephone Encounter (Signed)
Left message for pt to call back  °

## 2022-11-05 NOTE — Telephone Encounter (Signed)
Pt husband Chanetta Marshall made aware of recent results and Dr. Leone Payor recommendations: Chanetta Marshall notified of the prescription that was sent to pharmacy: Pt was scheduled for a follow up appointment on 01/07/2023 at 1:30 PM. Jimmy made aware. Jimmy  verbalized understanding with all questions answered.

## 2022-11-05 NOTE — Telephone Encounter (Signed)
Please let patient/husband know that the blood test indicates that diarrhea is because of gallbladder removal.  Starting this medicine.  Meds ordered this encounter  Medications   cholestyramine light (PREVALITE) 4 g packet    Sig: Take 1 packet (4 g total) by mouth daily with lunch.    Dispense:  90 each    Refill:  3     She can try this instead of the dicyclomine but may still use the dicycolmine if needed.  Please make her a f/u appt w/ me next available (2-3 mos)

## 2022-11-06 DIAGNOSIS — M19012 Primary osteoarthritis, left shoulder: Secondary | ICD-10-CM | POA: Diagnosis not present

## 2022-11-08 DIAGNOSIS — M19012 Primary osteoarthritis, left shoulder: Secondary | ICD-10-CM | POA: Diagnosis not present

## 2022-11-11 DIAGNOSIS — Z96612 Presence of left artificial shoulder joint: Secondary | ICD-10-CM | POA: Diagnosis not present

## 2022-11-13 DIAGNOSIS — M19012 Primary osteoarthritis, left shoulder: Secondary | ICD-10-CM | POA: Diagnosis not present

## 2022-11-15 DIAGNOSIS — M19012 Primary osteoarthritis, left shoulder: Secondary | ICD-10-CM | POA: Diagnosis not present

## 2022-11-19 DIAGNOSIS — R8271 Bacteriuria: Secondary | ICD-10-CM | POA: Diagnosis not present

## 2022-11-19 DIAGNOSIS — N3000 Acute cystitis without hematuria: Secondary | ICD-10-CM | POA: Diagnosis not present

## 2022-11-20 DIAGNOSIS — M19012 Primary osteoarthritis, left shoulder: Secondary | ICD-10-CM | POA: Diagnosis not present

## 2022-11-22 DIAGNOSIS — M19012 Primary osteoarthritis, left shoulder: Secondary | ICD-10-CM | POA: Diagnosis not present

## 2022-11-25 ENCOUNTER — Ambulatory Visit
Admission: RE | Admit: 2022-11-25 | Discharge: 2022-11-25 | Disposition: A | Discharge status: Home / Self Care | Payer: Medicare HMO | Source: Ambulatory Visit | Attending: Family Medicine | Admitting: Family Medicine

## 2022-11-25 ENCOUNTER — Ambulatory Visit: Payer: Medicare HMO

## 2022-11-25 DIAGNOSIS — Z1231 Encounter for screening mammogram for malignant neoplasm of breast: Secondary | ICD-10-CM

## 2022-11-25 DIAGNOSIS — I4819 Other persistent atrial fibrillation: Secondary | ICD-10-CM

## 2022-11-27 DIAGNOSIS — M19012 Primary osteoarthritis, left shoulder: Secondary | ICD-10-CM | POA: Diagnosis not present

## 2022-11-29 ENCOUNTER — Ambulatory Visit (INDEPENDENT_AMBULATORY_CARE_PROVIDER_SITE_OTHER): Payer: Medicare HMO | Admitting: Family Medicine

## 2022-11-29 VITALS — BP 90/58 | HR 86 | Temp 98.1°F | Ht 65.0 in | Wt 176.0 lb

## 2022-11-29 DIAGNOSIS — R300 Dysuria: Secondary | ICD-10-CM | POA: Diagnosis not present

## 2022-11-29 DIAGNOSIS — N10 Acute tubulo-interstitial nephritis: Secondary | ICD-10-CM | POA: Diagnosis not present

## 2022-11-29 LAB — POC URINALSYSI DIPSTICK (AUTOMATED)
Bilirubin, UA: NEGATIVE
Blood, UA: POSITIVE
Glucose, UA: NEGATIVE
Ketones, UA: NEGATIVE
Nitrite, UA: NEGATIVE
Protein, UA: NEGATIVE
Spec Grav, UA: 1.01 (ref 1.010–1.025)
Urobilinogen, UA: 0.2 U/dL
pH, UA: 5.5 (ref 5.0–8.0)

## 2022-11-29 LAB — CUP PACEART REMOTE DEVICE CHECK
Date Time Interrogation Session: 20240926065732
Implantable Pulse Generator Implant Date: 20210816
Zone Setting Status: 755011
Zone Setting Status: 755011
Zone Setting Status: 755011
Zone Setting Status: 755011
Zone Setting Status: 755011

## 2022-11-29 MED ORDER — CEFPODOXIME PROXETIL 200 MG PO TABS: 200.0000 mg | ORAL_TABLET | Freq: Two times a day (BID) | ORAL | 0 refills | Status: DC

## 2022-11-29 NOTE — Assessment & Plan Note (Addendum)
Acute, concern for involvement of upper urinary tract given chills and left flank pain.  Also with left lower quadrant pain.  Less likely stone given none seen in April.  History of diverticulosis but also less likely diverticulitis at this point given urinary symptoms. Will send urine for culture. At this point no clear indication for hospitalization.  Planned use of Cipro but given interaction with Tikosyn and recent Augmentin course, recommended treatment with cefpodoxime 200 mg twice daily x 10 days. Push fluids. Given return and ER precautions including ER visit for fever on antibiotic, emesis/not tolerating antibiotic.

## 2022-11-29 NOTE — Progress Notes (Signed)
Patient ID: KRISY BALKEMA, female    DOB: [birth year], 77 y.o.   MRN: [id number]  This visit was conducted in person.  BP (!) 90/58 (BP Location: Left Arm, Patient Position: Sitting, Cuff Size: Normal)   Pulse 86   Temp 98.1 F (36.7 C) (Oral)   Ht 5\' 5"  (1.651 m)   Wt 176 lb (79.8 kg)   LMP  (LMP Unknown)   SpO2 97%   BMI 29.29 kg/m    CC:  Chief Complaint  Patient presents with   Back Pain    Pt reports she is having left side pain with chills, nauseated. Was seen with [facility] on 9/17. Was given Augmentin. Completed it. Taking azo.     Urinary Frequency    Started two days. Pt brought urine sample from home,     Subjective:   HPI: MEGNAN EARNHARDT is a 76 y.o. female with history of diabetes, atrial fibrillation presenting on 11/29/2022 for Back Pain (Pt reports she is having left side pain with chills, nauseated. Was seen with [facility] on 9/17. Was given Augmentin. Completed it. Taking azo.  ) and Urinary Frequency (Started two days. Pt brought urine sample from home, )   She reports she was seen with [facility] for OAB follow up (  on September 17.  Diagnosed with UTI.. minimal symptoms, mild urgency. Was given Augmentin course times x 7 days.  Culture with bacteria sensitive.. no record of type.  She felt better overall.   Now in last 2 days she  has noted low back pain, left lower abdominal pain.  Then started dysuria, nausea.  Increased frequency and urgency.  Chills, no fever.  Azo has helped some.   No  past kidney stones.   Reviewed CT abdomen pelvis from April 2024 of note no nephrolithiasis seen at that point    Relevant past medical, surgical, family and social history reviewed and updated as indicated. Interim medical history since our last visit reviewed. Allergies and medications reviewed and updated. Outpatient Medications Prior to Visit  Medication Sig Dispense Refill   apixaban (ELIQUIS) 5 MG TABS tablet TAKE ONE TABLET BY  MOUTH TWICE DAILY 60 tablet 5   calcium carbonate (OS-CAL - DOSED IN MG OF ELEMENTAL CALCIUM) 1250 (500 Ca) MG tablet Take 1 tablet by mouth 2 (two) times a week.     cholestyramine light (PREVALITE) 4 g packet Take 1 packet (4 g total) by mouth daily with lunch. 90 each 3   dicyclomine (BENTYL) 20 MG tablet Take 1 tablet (20 mg total) by mouth 3 (three) times daily before meals. 270 tablet 3   dofetilide (TIKOSYN) 250 MCG capsule TAKE 1 CAPSULE BY MOUTH TWICE DAILY 180 capsule 3   ferrous sulfate 325 (65 FE) MG tablet Take 1 tablet (325 mg total) by mouth daily with breakfast.     glucose blood (ONETOUCH ULTRA) test strip CHECK BLOOD SUGAR 2 TIMES A DAY AS DIRECTED DX CODE E11.9 100 strip 3   hydroxypropyl methylcellulose / hypromellose (ISOPTO TEARS / GONIOVISC) 2.5 % ophthalmic solution Place 1 drop into both eyes 2 (two) times daily as needed for dry eyes.     Insulin Glargine (BASAGLAR KWIKPEN) 100 UNIT/ML Inject 8-12 Units into the skin at bedtime. 15 mL 1   Insulin Pen Needle 32G X 4 MM MISC Use 1x a day 100 each 3   magnesium oxide (MAG-OX) 400 MG tablet Take 1 tablet (400 mg total) by mouth 2 (  two) times daily. 60 tablet 6   metFORMIN (GLUCOPHAGE-XR) 500 MG 24 hr tablet TAKE TWO TABLETS BY MOUTH TWICE DAILY WITH MEALS 360 tablet 3   metoprolol succinate (TOPROL XL) 25 MG 24 hr tablet Take 0.5 tablets (12.5 mg total) by mouth daily. 45 tablet 1   MYRBETRIQ 50 MG TB24 tablet Take 50 mg by mouth daily as needed.     sertraline (ZOLOFT) 100 MG tablet TAKE 1 TABLET BY MOUTH NIGHTLY 30 tablet 5   traMADol (ULTRAM) 50 MG tablet Take 50 mg by mouth as needed.     zolpidem (AMBIEN) 10 MG tablet Take 5 mg by mouth at bedtime.     oxyCODONE (OXY IR/ROXICODONE) 5 MG immediate release tablet Take by mouth. (Patient not taking: Reported on 10/29/2022)     No facility-administered medications prior to visit.     Per HPI unless specifically indicated in ROS section below Review of Systems Objective:   BP (!) 90/58 (BP Location: Left Arm, Patient Position: Sitting, Cuff Size: Normal)   Pulse 86   Temp 98.1 F (36.7 C) (Oral)   Ht 5\' 5"  (1.651 m)   Wt 176 lb (79.8 kg)   LMP  (LMP Unknown)   SpO2 97%   BMI 29.29 kg/m   Wt Readings from Last 3 Encounters:  11/29/22 176 lb (79.8 kg)  10/29/22 178 lb (80.7 kg)  10/24/22 176 lb 8 oz (80.1 kg)      Physical Exam Constitutional:      General: She is not in acute distress.    Appearance: Normal appearance. She is well-developed. She is not ill-appearing or toxic-appearing.  HENT:     Head: Normocephalic.     Right Ear: Hearing, tympanic membrane, ear canal and external ear normal. Tympanic membrane is not erythematous, retracted or bulging.     Left Ear: Hearing, tympanic membrane, ear canal and external ear normal. Tympanic membrane is not erythematous, retracted or bulging.     Nose: No mucosal edema or rhinorrhea.     Right Sinus: No maxillary sinus tenderness or frontal sinus tenderness.     Left Sinus: No maxillary sinus tenderness or frontal sinus tenderness.     Mouth/Throat:     Mouth: Oropharynx is clear and moist and mucous membranes are normal.     Pharynx: Uvula midline.  Eyes:     General: Lids are normal. Lids are everted, no foreign bodies appreciated.     Extraocular Movements: EOM normal.     Conjunctiva/sclera: Conjunctivae normal.     Pupils: Pupils are equal, round, and reactive to light.  Neck:     Thyroid: No thyroid mass or thyromegaly.     Vascular: No carotid bruit.     Trachea: Trachea normal.  Cardiovascular:     Rate and Rhythm: Normal rate and regular rhythm.     Pulses: Normal pulses.     Heart sounds: Normal heart sounds, S1 normal and S2 normal. No murmur heard.    No friction rub. No gallop.  Pulmonary:     Effort: Pulmonary effort is normal. No tachypnea or respiratory distress.     Breath sounds: Normal breath sounds. No decreased breath sounds, wheezing, rhonchi or rales.  Abdominal:      General: Bowel sounds are normal.     Palpations: Abdomen is soft.     Tenderness: There is abdominal tenderness in the left lower quadrant. There is left CVA tenderness. There is no right CVA tenderness, guarding or rebound.  Musculoskeletal:  Cervical back: Normal range of motion and neck supple.     Lumbar back: Tenderness present. No bony tenderness. Decreased range of motion.  Skin:    General: Skin is warm, dry and intact.     Findings: No rash.  Neurological:     Mental Status: She is alert.  Psychiatric:        Mood and Affect: Mood is not anxious or depressed.        Speech: Speech normal.        Behavior: Behavior normal. Behavior is cooperative.        Thought Content: Thought content normal.        Cognition and Memory: Cognition and memory normal.        Judgment: Judgment normal.       Results for orders placed or performed in visit on 11/29/22  POCT Urinalysis Dipstick (Automated)  Result Value Ref Range   Color, UA pale yellow    Clarity, UA cloudy    Glucose, UA Negative Negative   Bilirubin, UA Negative    Ketones, UA Negative    Spec Grav, UA 1.010 1.010 - 1.025   Blood, UA Positive    pH, UA 5.5 5.0 - 8.0   Protein, UA Negative Negative   Urobilinogen, UA 0.2 0.2 or 1.0 E.U./dL   Nitrite, UA Negative    Leukocytes, UA Large (3+) (A) Negative   *Note: Due to a large number of results and/or encounters for the requested time period, some results have not been displayed. A complete set of results can be found in Results Review.    Assessment and Plan  Acute pyelonephritis Assessment & Plan: Acute, concern for involvement of upper urinary tract given chills and left flank pain.  Also with left lower quadrant pain.  Less likely stone given none seen in April.  History of diverticulosis but also less likely diverticulitis at this point given urinary symptoms. Will send urine for culture. At this point no clear indication for hospitalization.  Planned  use of Cipro but given interaction with Tikosyn and recent Augmentin course, recommended treatment with cefpodoxime 200 mg twice daily x 10 days. Push fluids. Given return and ER precautions including ER visit for fever on antibiotic, emesis/not tolerating antibiotic.     Dysuria -     POCT Urinalysis Dipstick (Automated) -     Urine Culture; Future  Other orders -     Cefpodoxime Proxetil; Take 1 tablet (200 mg total) by mouth 2 (two) times daily.  Dispense: 20 tablet; Refill: 0    No follow-ups on file.   Kerby Nora, MD

## 2022-11-30 LAB — URINE CULTURE
MICRO NUMBER:: 15525137
Result:: NO GROWTH
SPECIMEN QUALITY:: ADEQUATE

## 2022-12-02 ENCOUNTER — Encounter: Payer: Self-pay | Admitting: Family Medicine

## 2022-12-04 DIAGNOSIS — M25512 Pain in left shoulder: Secondary | ICD-10-CM | POA: Diagnosis not present

## 2022-12-04 DIAGNOSIS — M25612 Stiffness of left shoulder, not elsewhere classified: Secondary | ICD-10-CM | POA: Diagnosis not present

## 2022-12-06 DIAGNOSIS — M25612 Stiffness of left shoulder, not elsewhere classified: Secondary | ICD-10-CM | POA: Diagnosis not present

## 2022-12-06 DIAGNOSIS — M25512 Pain in left shoulder: Secondary | ICD-10-CM | POA: Diagnosis not present

## 2022-12-09 DIAGNOSIS — K2100 Gastro-esophageal reflux disease with esophagitis, without bleeding: Secondary | ICD-10-CM | POA: Diagnosis not present

## 2022-12-09 DIAGNOSIS — N3281 Overactive bladder: Secondary | ICD-10-CM | POA: Diagnosis not present

## 2022-12-09 DIAGNOSIS — N83202 Unspecified ovarian cyst, left side: Secondary | ICD-10-CM | POA: Diagnosis not present

## 2022-12-09 DIAGNOSIS — R1013 Epigastric pain: Secondary | ICD-10-CM | POA: Diagnosis not present

## 2022-12-09 DIAGNOSIS — G4733 Obstructive sleep apnea (adult) (pediatric): Secondary | ICD-10-CM | POA: Diagnosis not present

## 2022-12-09 DIAGNOSIS — D649 Anemia, unspecified: Secondary | ICD-10-CM | POA: Diagnosis not present

## 2022-12-09 DIAGNOSIS — Z8679 Personal history of other diseases of the circulatory system: Secondary | ICD-10-CM | POA: Diagnosis not present

## 2022-12-09 DIAGNOSIS — N390 Urinary tract infection, site not specified: Secondary | ICD-10-CM | POA: Diagnosis not present

## 2022-12-09 DIAGNOSIS — E119 Type 2 diabetes mellitus without complications: Secondary | ICD-10-CM | POA: Diagnosis not present

## 2022-12-09 DIAGNOSIS — R5383 Other fatigue: Secondary | ICD-10-CM | POA: Diagnosis not present

## 2022-12-09 DIAGNOSIS — Z23 Encounter for immunization: Secondary | ICD-10-CM | POA: Diagnosis not present

## 2022-12-09 DIAGNOSIS — I10 Essential (primary) hypertension: Secondary | ICD-10-CM | POA: Diagnosis not present

## 2022-12-09 LAB — PROTEIN / CREATININE RATIO, URINE: Albumin, U: 6.2

## 2022-12-09 LAB — MICROALBUMIN / CREATININE URINE RATIO: Microalb Creat Ratio: 9.7

## 2022-12-09 NOTE — Progress Notes (Signed)
Carelink Summary Report / Loop Recorder 

## 2022-12-11 DIAGNOSIS — M25612 Stiffness of left shoulder, not elsewhere classified: Secondary | ICD-10-CM | POA: Diagnosis not present

## 2022-12-11 DIAGNOSIS — E538 Deficiency of other specified B group vitamins: Secondary | ICD-10-CM | POA: Diagnosis not present

## 2022-12-11 DIAGNOSIS — E871 Hypo-osmolality and hyponatremia: Secondary | ICD-10-CM | POA: Diagnosis not present

## 2022-12-11 DIAGNOSIS — D509 Iron deficiency anemia, unspecified: Secondary | ICD-10-CM | POA: Diagnosis not present

## 2022-12-11 DIAGNOSIS — M25512 Pain in left shoulder: Secondary | ICD-10-CM | POA: Diagnosis not present

## 2022-12-13 DIAGNOSIS — M25612 Stiffness of left shoulder, not elsewhere classified: Secondary | ICD-10-CM | POA: Diagnosis not present

## 2022-12-13 DIAGNOSIS — M25512 Pain in left shoulder: Secondary | ICD-10-CM | POA: Diagnosis not present

## 2022-12-13 DIAGNOSIS — M545 Low back pain, unspecified: Secondary | ICD-10-CM | POA: Diagnosis not present

## 2022-12-18 DIAGNOSIS — E785 Hyperlipidemia, unspecified: Secondary | ICD-10-CM | POA: Diagnosis not present

## 2022-12-18 DIAGNOSIS — I10 Essential (primary) hypertension: Secondary | ICD-10-CM | POA: Diagnosis not present

## 2022-12-18 DIAGNOSIS — Z8679 Personal history of other diseases of the circulatory system: Secondary | ICD-10-CM | POA: Diagnosis not present

## 2022-12-20 DIAGNOSIS — M19012 Primary osteoarthritis, left shoulder: Secondary | ICD-10-CM | POA: Diagnosis not present

## 2022-12-25 DIAGNOSIS — M19012 Primary osteoarthritis, left shoulder: Secondary | ICD-10-CM | POA: Diagnosis not present

## 2022-12-26 ENCOUNTER — Ambulatory Visit: Payer: Medicare HMO | Admitting: Internal Medicine

## 2022-12-27 ENCOUNTER — Other Ambulatory Visit: Payer: Self-pay | Admitting: Cardiovascular Disease

## 2022-12-27 DIAGNOSIS — M19012 Primary osteoarthritis, left shoulder: Secondary | ICD-10-CM | POA: Diagnosis not present

## 2022-12-27 DIAGNOSIS — I4819 Other persistent atrial fibrillation: Secondary | ICD-10-CM

## 2022-12-30 ENCOUNTER — Ambulatory Visit (INDEPENDENT_AMBULATORY_CARE_PROVIDER_SITE_OTHER): Payer: Medicare HMO

## 2022-12-30 DIAGNOSIS — I4819 Other persistent atrial fibrillation: Secondary | ICD-10-CM | POA: Diagnosis not present

## 2022-12-30 NOTE — Telephone Encounter (Signed)
Prescription refill request for Eliquis received. Indication: AF Last office visit: 10/24/22  Percell Belt NP Scr: 0.9 on 12/11/22  Epic Age: 77 Weight: 80.1kg  Based on above findings Eliquis 5mg  twice daily is the appropriate dose.  Refill approved.

## 2022-12-31 LAB — CUP PACEART REMOTE DEVICE CHECK
Date Time Interrogation Session: 20241027230204
Implantable Pulse Generator Implant Date: 20210816

## 2023-01-06 DIAGNOSIS — E538 Deficiency of other specified B group vitamins: Secondary | ICD-10-CM | POA: Diagnosis not present

## 2023-01-07 ENCOUNTER — Ambulatory Visit: Payer: Medicare HMO | Admitting: Internal Medicine

## 2023-01-07 ENCOUNTER — Encounter: Payer: Self-pay | Admitting: Internal Medicine

## 2023-01-07 VITALS — BP 122/68 | HR 54 | Ht 65.0 in | Wt 179.0 lb

## 2023-01-07 DIAGNOSIS — K9089 Other intestinal malabsorption: Secondary | ICD-10-CM

## 2023-01-07 DIAGNOSIS — R109 Unspecified abdominal pain: Secondary | ICD-10-CM | POA: Diagnosis not present

## 2023-01-07 DIAGNOSIS — K580 Irritable bowel syndrome with diarrhea: Secondary | ICD-10-CM | POA: Diagnosis not present

## 2023-01-07 DIAGNOSIS — G8929 Other chronic pain: Secondary | ICD-10-CM

## 2023-01-07 DIAGNOSIS — M19012 Primary osteoarthritis, left shoulder: Secondary | ICD-10-CM | POA: Diagnosis not present

## 2023-01-07 MED ORDER — HYOSCYAMINE SULFATE ER 0.375 MG PO TB12: 0.3750 mg | ORAL_TABLET | Freq: Two times a day (BID) | ORAL | 1 refills | Status: AC

## 2023-01-07 NOTE — Patient Instructions (Signed)
We have provided you with a printed rx for hyoscyamine to get filled. Use goodrx to get a better price.  Message [name] in a few weeks with an update.   _______________________________________________________  If your blood pressure at your visit was 140/90 or greater, please contact your primary care physician to follow up on this.  _______________________________________________________  If you are age 75 or older, your body mass index should be between 23-30. Your Body mass index is 29.79 kg/m. If this is out of the aforementioned range listed, please consider follow up with your Primary Care Provider.  If you are age 2 or younger, your body mass index should be between 19-25. Your Body mass index is 29.79 kg/m. If this is out of the aformentioned range listed, please consider follow up with your Primary Care Provider.   ________________________________________________________  The [facility] providers would like to encourage you to use [facility] to communicate with providers for non-urgent requests or questions.  Due to long hold times on the telephone, sending your provider a message by [facility] may be a faster and more efficient way to get a response.  Please allow 48 business hours for a response.  Please remember that this is for non-urgent requests.  _______________________________________________________  I appreciate the opportunity to care for you. Stan Head, MD, [facility]

## 2023-01-07 NOTE — Progress Notes (Signed)
[name] 77 y.o. [birth year] [id number]  Assessment & Plan:   Encounter Diagnoses  Name Primary?   Bile salt-induced diarrhea Yes   Chronic abdominal pain    Irritable bowel syndrome with diarrhea    Assessment and Plan    Chronic Diarrhea Improved with Questran, Continue Questran at current half-dose, as patient is comfortable with current bowel movement frequency. -Trial of hyoscyamine (Levbid) once or twice daily for abdominal pain.  Abdominal Pain Persistent, intermittent right-sided abdominal pain. No clear cause identified from previous upper endoscopy and colonoscopy. Mild epigastric tenderness on exam. Possible neuropathic pain related to diabetes.  She has some mild neuropathic symptoms in her feet it seems but no retinopathy per her report.  Still possible however. -Continue hyoscyamine trial for pain management. -Consider future trial of nortriptyline or amitriptyline if no improvement with hyoscyamine.   Follow-up Monitor response to hyoscyamine for abdominal pain. If no improvement, consider other treatment options.        Meds ordered this encounter  Medications   hyoscyamine (LEVBID) 0.375 MG 12 hr tablet    Sig: Take 1 tablet (0.375 mg total) by mouth 2 (two) times daily.    Dispense:  60 tablet    Refill:  1        Subjective:   Chief Complaint: IBS follow-up  HPI The patient is a 77 year old white woman seen in July and August with right upper quadrant and epigastric pain, urgency with defecation and change in bowel habits and a right ovarian cyst and elevated lipase.  She underwent EGD and colonoscopy as outlined below.  She reports she is better on dicyclomine that I began.  She is on the 10 mg dose she still having some urgency with defecation but no incontinence.  Abdominal cramping is better.  She has followed up with gynecology and the right ovarian cyst is not thought to be a malignancy and no surgery is planned.     Colonoscopy  09/23/2022  - The examined portion of the ileum was normal.                           - Three 3 to 5 mm polyps in the ascending colon,                            removed with a cold snare. Resected and retrieved.                           - One 6 mm polyp in the proximal transverse colon,                            removed with a cold snare. Resected and retrieved.                           - Severe diverticulosis in the sigmoid colon, in                            the descending colon with associated tortuosity and                            angulation. Mild diverticulosis in the proximal  transverse colon and at the hepatic flexure.                           - The examination was otherwise normal on direct                            and retroflexion views. EGD 09/23/2022 - LA Grade A (mild) reflux esophagitis.                           - 3 cm hiatal hernia.                           - Multiple gastric polyps. Biopsied. Polyps appear                            benign and endoscopically consistent with fundic                            gland polyps.                           - Normal examined duodenum. Not biopsied today as                            no evidence for celiac disease in 2019 by biopsy                            for IDA. 1. Surgical [P], gastric polyps - FUNDIC GLAND POLYPS. 2. Surgical [P], colon, ascending, polyp (3) - TUBULAR ADENOMA, FRAGMENTS. 3. Surgical [P], colon, transverse, polyp (1) - NONDIAGNOSTIC, FECAL MATERIAL ONLY.    Discussed the use of AI scribe software for clinical note transcription with the patient, who gave verbal consent to proceed.  History of Present Illness   The patient, with a history of atrial fibrillation, presents for follow-up of chronic diarrhea, which has now transitioned to constipation after starting Questran. I had tested her for bile salt diarrhea with a 7 alpha C4 blood test in the level was 93 consistent  with bile salt malabsorption so Questran was prescribed subsequent to the August visit.  At that point she discontinued dicyclomine and my recommendation.  She reports taking only half the prescribed dose due to constipation, but still experiences persistent abdominal pain. though she says she is constipated she is satisfied w/ defecation every other day.  The pain is described as an aching sensation located in the right upper quadrant and epigastric region, lasting for approximately five minutes at a time, but occurring intermittently throughout the day. The pain does not wake the patient at night, but it does affect her daily activities, causing a lack of motivation to engage in routine tasks.   as outlined above.  Though she had told me in the past the dicyclomine did help the pain her husband says and she agrees that it really did not.    She also reports sleep disturbances, which she attributes to her atrial fibrillation medication, Tikosyn. She is currently taking Ambien to help with sleep, but reports that it can make her feel "lulu" if she takes too much.  The patient is  scheduled to see a new cardiologist to discuss the possibility of discontinuing Tikosyn, as her previous cardiologist suspects it may be contributing to her overall malaise. However, she does not believe it is the cause of her abdominal pain.     She also has a history of chronic iron deficiency anemia which is not new, she went to [facility] for an evaluation of this and on December 11, 2022 celiac disease panel was normal. Allergies  Allergen Reactions   Pravachol [Pravastatin Sodium] Other (See Comments)    Leg pain    Cortisone Rash    Cortisone injection NOT PREDNISONE >40 years ago.. caused rash in setting of treating poison ivy? If true reaction. Has tolerated prednisolone in past in form of eye drops   Current Meds  Medication Sig   calcium carbonate (OS-CAL - DOSED IN MG OF ELEMENTAL CALCIUM) 1250 (500 Ca) MG tablet Take  1 tablet by mouth 2 (two) times a week.   cefpodoxime (VANTIN) 200 MG tablet Take 1 tablet (200 mg total) by mouth 2 (two) times daily.   cholestyramine light (PREVALITE) 4 g packet Take 1 packet (4 g total) by mouth daily with lunch.   dicyclomine (BENTYL) 20 MG tablet Take 1 tablet (20 mg total) by mouth 3 (three) times daily before meals.   dofetilide (TIKOSYN) 250 MCG capsule TAKE 1 CAPSULE BY MOUTH TWICE DAILY   ELIQUIS 5 MG TABS tablet TAKE ONE TABLET BY MOUTH TWICE DAILY   ferrous sulfate 325 (65 FE) MG tablet Take 1 tablet (325 mg total) by mouth daily with breakfast.   glucose blood (ONETOUCH ULTRA) test strip CHECK BLOOD SUGAR 2 TIMES A DAY AS DIRECTED DX CODE E11.9   hydroxypropyl methylcellulose / hypromellose (ISOPTO TEARS / GONIOVISC) 2.5 % ophthalmic solution Place 1 drop into both eyes 2 (two) times daily as needed for dry eyes.   Insulin Glargine (BASAGLAR KWIKPEN) 100 UNIT/ML Inject 8-12 Units into the skin at bedtime.   Insulin Pen Needle 32G X 4 MM MISC Use 1x a day   magnesium oxide (MAG-OX) 400 MG tablet Take 1 tablet (400 mg total) by mouth 2 (two) times daily.   metFORMIN (GLUCOPHAGE-XR) 500 MG 24 hr tablet TAKE TWO TABLETS BY MOUTH TWICE DAILY WITH MEALS   metoprolol succinate (TOPROL XL) 25 MG 24 hr tablet Take 0.5 tablets (12.5 mg total) by mouth daily.   MYRBETRIQ 50 MG TB24 tablet Take 50 mg by mouth daily as needed.   oxyCODONE (OXY IR/ROXICODONE) 5 MG immediate release tablet Take by mouth.   sertraline (ZOLOFT) 100 MG tablet TAKE 1 TABLET BY MOUTH NIGHTLY   traMADol (ULTRAM) 50 MG tablet Take 50 mg by mouth as needed.   zolpidem (AMBIEN) 10 MG tablet Take 5 mg by mouth at bedtime.   Past Medical History:  Diagnosis Date   Anemia    Anxiety    Atrial fibrillation (HCC)    Colon polyps    Constipation    Depressive disorder, not elsewhere classified    Diabetes mellitus without complication (HCC)    Diaphragmatic hernia without mention of obstruction or  gangrene    Disorders of bursae and tendons in shoulder region, unspecified    Diverticulosis of colon (without mention of hemorrhage)    External hemorrhoid    Fundic gland polyps of stomach, benign    GERD (gastroesophageal reflux disease)    History of hiatal hernia    HTN (hypertension)    Hypertension    Hypothyroidism  Internal hemorrhoids without mention of complication    Moderate mitral regurgitation    Obesity, unspecified    OSA on CPAP    compliant with CPAP   Other urinary incontinence    Persistent atrial fibrillation (HCC)    Pure hypercholesterolemia    Tubular adenoma of colon    Type II diabetes mellitus (HCC)    Urinary frequency    Urinary urgency    Vitamin B 12 deficiency    Past Surgical History:  Procedure Laterality Date   ABDOMINAL HYSTERECTOMY  1972   Have both ovaries   ATRIAL FIBRILLATION ABLATION  12/17/2016   ATRIAL FIBRILLATION ABLATION N/A 12/17/2016   Procedure: ATRIAL FIBRILLATION ABLATION;  Surgeon: [name], MD;  Location: [facility];  Service: Cardiovascular;  Laterality: N/A;   ATRIAL FIBRILLATION ABLATION N/A 04/23/2022   Procedure: ATRIAL FIBRILLATION ABLATION;  Surgeon: [name], MD;  Location: [facility];  Service: Cardiovascular;  Laterality: N/A;   CARDIAC CATHETERIZATION  1990s   [facility]; "[name]"   CARDIOVERSION N/A 05/27/2016   Procedure: CARDIOVERSION;  Surgeon: [name], MD;  Location: [facility];  Service: Cardiovascular;  Laterality: N/A;   CARDIOVERSION N/A 11/25/2016   Procedure: CARDIOVERSION;  Surgeon: [name], MD;  Location: [facility];  Service: Cardiovascular;  Laterality: N/A;   CARDIOVERSION N/A 09/18/2018   Procedure: CARDIOVERSION;  Surgeon: [name], MD;  Location: [facility];  Service: Cardiovascular;  Laterality: N/A;   CARDIOVERSION N/A 07/28/2019   Procedure: CARDIOVERSION;  Surgeon: [name], MD;  Location: [facility];  Service: Cardiovascular;   Laterality: N/A;   CARDIOVERSION N/A 09/07/2019   Procedure: CARDIOVERSION;  Surgeon: [name], MD;  Location: [facility];  Service: Cardiovascular;  Laterality: N/A;   CARDIOVERSION N/A 02/19/2022   Procedure: CARDIOVERSION;  Surgeon: [name], MD;  Location: [facility];  Service: Cardiovascular;  Laterality: N/A;   CATARACT EXTRACTION W/ INTRAOCULAR LENS  IMPLANT, BILATERAL Bilateral    COLONOSCOPY     ELECTROPHYSIOLOGIC STUDY N/A 01/24/2016   Procedure: CARDIOVERSION;  Surgeon: [name], MD;  Location: [facility];  Service: Cardiovascular;  Laterality: N/A;   implantable loop recorder implantation  10/18/2019   [employer] Reveal [place] model GMW10 [phone]) implantable loop recorder    LAPAROSCOPIC CHOLECYSTECTOMY     never stimulator     for her bladder   REDUCTION MAMMAPLASTY Bilateral    SHOULDER SURGERY Left 10/03/2022   TONSILLECTOMY     TOTAL HIP ARTHROPLASTY Right 05/12/2018   Procedure: TOTAL HIP ARTHROPLASTY ANTERIOR APPROACH;  Surgeon: Durene Romans, MD;  Location: [facility];  Service: Orthopedics;  Laterality: Right;  70 mins   Social History   Social History Narrative   ** Merged History Encounter **       Exercise walks 3 times daily Lives with spouse in [place] - retired from employment 1 son and 1 daughter  Diet fruit, occ fast food, salads and lean meat  3 tea/day, no EtOH, no tobacco, no drugs    family history includes Cancer in her brother and mother; Dementia in her maternal aunt and mother; Diabetes in her brother and mother; Hyperlipidemia in her father and mother; Hypertension in her sister; Kidney cancer in her son; Lung cancer in her father; Pulmonary embolism in her mother; Stroke in her paternal grandfather and paternal grandmother.   Review of Systems As per HPI  Objective:   Physical Exam BP 122/68   Pulse (!) 54   Ht 5'  5" (1.651 m)   Wt 179 lb (81.2 kg)   LMP  (LMP Unknown)   BMI 29.79 kg/m

## 2023-01-09 DIAGNOSIS — M25551 Pain in right hip: Secondary | ICD-10-CM | POA: Diagnosis not present

## 2023-01-16 DIAGNOSIS — N3000 Acute cystitis without hematuria: Secondary | ICD-10-CM | POA: Diagnosis not present

## 2023-01-16 DIAGNOSIS — R8271 Bacteriuria: Secondary | ICD-10-CM | POA: Diagnosis not present

## 2023-01-16 DIAGNOSIS — N3281 Overactive bladder: Secondary | ICD-10-CM | POA: Diagnosis not present

## 2023-01-17 ENCOUNTER — Other Ambulatory Visit: Payer: Self-pay | Admitting: Family Medicine

## 2023-01-17 NOTE — Telephone Encounter (Signed)
Please schedule CPE with Dr. Ermalene Searing.  No prior labs needed.

## 2023-01-20 NOTE — Telephone Encounter (Signed)
LVM for patient to schedule.

## 2023-01-20 NOTE — Progress Notes (Signed)
Carelink Summary Report / Loop Recorder 

## 2023-01-28 DIAGNOSIS — D508 Other iron deficiency anemias: Secondary | ICD-10-CM | POA: Diagnosis not present

## 2023-02-02 LAB — CUP PACEART REMOTE DEVICE CHECK
Date Time Interrogation Session: 20241129230309
Implantable Pulse Generator Implant Date: 20210816

## 2023-02-03 ENCOUNTER — Ambulatory Visit (INDEPENDENT_AMBULATORY_CARE_PROVIDER_SITE_OTHER): Payer: Medicare HMO

## 2023-02-03 DIAGNOSIS — I4819 Other persistent atrial fibrillation: Secondary | ICD-10-CM | POA: Diagnosis not present

## 2023-02-06 DIAGNOSIS — E538 Deficiency of other specified B group vitamins: Secondary | ICD-10-CM | POA: Diagnosis not present

## 2023-02-12 ENCOUNTER — Encounter: Payer: Self-pay | Admitting: Nurse Practitioner

## 2023-02-12 ENCOUNTER — Ambulatory Visit: Payer: Medicare HMO | Attending: Nurse Practitioner | Admitting: Nurse Practitioner

## 2023-02-12 VITALS — BP 136/72 | HR 81 | Ht 65.0 in | Wt 173.0 lb

## 2023-02-12 DIAGNOSIS — E119 Type 2 diabetes mellitus without complications: Secondary | ICD-10-CM

## 2023-02-12 DIAGNOSIS — G4733 Obstructive sleep apnea (adult) (pediatric): Secondary | ICD-10-CM

## 2023-02-12 DIAGNOSIS — I480 Paroxysmal atrial fibrillation: Secondary | ICD-10-CM | POA: Diagnosis not present

## 2023-02-12 DIAGNOSIS — I10 Essential (primary) hypertension: Secondary | ICD-10-CM

## 2023-02-12 DIAGNOSIS — I4719 Other supraventricular tachycardia: Secondary | ICD-10-CM | POA: Diagnosis not present

## 2023-02-12 NOTE — Patient Instructions (Addendum)
Medication Instructions:  No changes *If you need a refill on your cardiac medications before your next appointment, please call your pharmacy*   Lab Work: None ordered If you have labs (blood work) drawn today and your tests are completely normal, you will receive your results only by: MyChart Message (if you have MyChart) OR A paper copy in the mail If you have any lab test that is abnormal or we need to change your treatment, we will call you to review the results.   Testing/Procedures: None ordered   Follow-Up: At [facility], you and your health needs are our priority.  As part of our continuing mission to provide you with exceptional heart care, we have created designated Provider Care Teams.  These Care Teams include your primary Cardiologist (physician) and Advanced Practice Providers (APPs -  Physician Assistants and Nurse Practitioners) who all work together to provide you with the care you need, when you need it.  We recommend signing up for the patient portal called "MyChart".  Sign up information is provided on this After Visit Summary.  MyChart is used to connect with patients for Virtual Visits (Telemedicine).  Patients are able to view lab/test results, encounter notes, upcoming appointments, etc.  Non-urgent messages can be sent to your provider as well.   To learn more about what you can do with MyChart, go to [email or web address].    Your next appointment:   2 month(s) (device)  Provider:   Sherie Don, NP

## 2023-02-12 NOTE — Progress Notes (Addendum)
Office Visit    Patient Name: [name] Date of Encounter: 02/12/2023  Primary Care Provider:  [name], MD Primary Cardiologist:  Lorine Bears, MD  Chief Complaint    77 y.o. female with a history of symptomatic persistent atrial fibrillation status post PVI x 2, pulmonary hypertension, mitral valve prolapse with mild mitral regurgitation, hypertension, hyperlipidemia, type 2 diabetes mellitus, hypothyroidism, anemia, obesity, sleep apnea on CPAP, diverticulosis, GERD, and anxiety, who presents for follow-up related to Afib/atrial tachycardia.  Past Medical History  Subjective   Past Medical History:  Diagnosis Date   Anemia    Anxiety    Chest pain    a. 09/2013 MV: Low risk; b. 12/2016 Coronary CTA/morphology: Ca2+ = 0; c. 11/2021 MV: EF 75%, no isch/infarct. Ao atherosclerosis noted. No signif Cor Ca2+.   Colon polyps    Constipation    Depressive disorder, not elsewhere classified    Diabetes mellitus without complication (HCC)    Diaphragmatic hernia without mention of obstruction or gangrene    Diastolic dysfunction    a. 01/2022 Echo: EF 55-60%, no rwma, GrII DD, nl RV size/fxn, RVSP 33.23mmHg. Sev dil LA, mild MR.   Disorders of bursae and tendons in shoulder region, unspecified    Diverticulosis of colon (without mention of hemorrhage)    External hemorrhoid    Fundic gland polyps of stomach, benign    GERD (gastroesophageal reflux disease)    History of hiatal hernia    HTN (hypertension)    Hypertension    Hypothyroidism    Internal hemorrhoids without mention of complication    Mitral regurgitation    a. 01/2022 Echo: Mild MR.   Obesity, unspecified    OSA on CPAP    compliant with CPAP   Other urinary incontinence    Persistent atrial fibrillation (HCC)    a. Dx 09/2013 s/p multiple DCCVS; b. 12/2016 s/p PVI; c. 2023 Flecainide changed to Tikosyn; d. 04/2022 s/p redo PVI/fib/flutter ablation.   Pure hypercholesterolemia    Tubular adenoma of  colon    Type II diabetes mellitus (HCC)    Urinary frequency    Urinary urgency    Vitamin B 12 deficiency    Past Surgical History:  Procedure Laterality Date   ABDOMINAL HYSTERECTOMY  1972   Have both ovaries   ATRIAL FIBRILLATION ABLATION  12/17/2016   ATRIAL FIBRILLATION ABLATION N/A 12/17/2016   Procedure: ATRIAL FIBRILLATION ABLATION;  Surgeon: [name], MD;  Location: [facility];  Service: Cardiovascular;  Laterality: N/A;   ATRIAL FIBRILLATION ABLATION N/A 04/23/2022   Procedure: ATRIAL FIBRILLATION ABLATION;  Surgeon: [name], MD;  Location: [facility];  Service: Cardiovascular;  Laterality: N/A;   CARDIAC CATHETERIZATION  1990s   [facility]; "[name]"   CARDIOVERSION N/A 05/27/2016   Procedure: CARDIOVERSION;  Surgeon: [name], MD;  Location: [facility];  Service: Cardiovascular;  Laterality: N/A;   CARDIOVERSION N/A 11/25/2016   Procedure: CARDIOVERSION;  Surgeon: [name], MD;  Location: [facility];  Service: Cardiovascular;  Laterality: N/A;   CARDIOVERSION N/A 09/18/2018   Procedure: CARDIOVERSION;  Surgeon: [name], MD;  Location: [facility];  Service: Cardiovascular;  Laterality: N/A;   CARDIOVERSION N/A 07/28/2019   Procedure: CARDIOVERSION;  Surgeon: [name], MD;  Location: [facility];  Service: Cardiovascular;  Laterality: N/A;   CARDIOVERSION N/A 09/07/2019   Procedure: CARDIOVERSION;  Surgeon: [name], MD;  Location: [facility];  Service: Cardiovascular;  Laterality:  N/A;   CARDIOVERSION N/A 02/19/2022   Procedure: CARDIOVERSION;  Surgeon: [name], MD;  Location: [facility];  Service: Cardiovascular;  Laterality: N/A;   CATARACT EXTRACTION W/ INTRAOCULAR LENS  IMPLANT, BILATERAL Bilateral    COLONOSCOPY     ELECTROPHYSIOLOGIC STUDY N/A 01/24/2016   Procedure: CARDIOVERSION;  Surgeon: [name], MD;  Location: [facility];  Service: Cardiovascular;  Laterality: N/A;   implantable loop  recorder implantation  10/18/2019   Medtronic Reveal Dyer model [id number]) implantable loop recorder    LAPAROSCOPIC CHOLECYSTECTOMY     never stimulator     for her bladder   REDUCTION MAMMAPLASTY Bilateral    SHOULDER SURGERY Left 10/03/2022   TONSILLECTOMY     TOTAL HIP ARTHROPLASTY Right 05/12/2018   Procedure: TOTAL HIP ARTHROPLASTY ANTERIOR APPROACH;  Surgeon: Durene Romans, MD;  Location: [facility];  Service: Orthopedics;  Laterality: Right;  70 mins    Allergies  Allergies  Allergen Reactions   Pravachol [Pravastatin Sodium] Other (See Comments)    Leg pain    Cortisone Rash    Cortisone injection NOT PREDNISONE >40 years ago.. caused rash in setting of treating poison ivy? If true reaction. Has tolerated prednisolone in past in form of eye drops      History of Present Illness      77 y.o. y/o female with a history of symptomatic persistent atrial fibrillation status post PVI x 2, pulmonary hypertension, mitral valve prolapse with mild mitral regurgitation, hypertension, hyperlipidemia, type 2 diabetes mellitus, hypothyroidism, anemia, obesity, sleep apnea on CPAP, diverticulosis, GERD, and anxiety.  In the setting of atypical chest pain, she previously underwent diagnostic catheterization October 2020, which showed no significant CAD and normal LV function.  She was first diagnosed with atrial fibrillation in July 2015.  Echo at that time showed normal LV function with mild MR and mild to moderate pulmonary hypertension.  Stress testing in July 2015 was normal.  She was subsequently placed on flecainide and required cardioversion in November 2017 for recurrent A-fib.  She required repeat cardioversions in March 2018 in September 2018.  She underwent successful radiofrequency catheter ablation October 2018.  Coronary CTA/morphology at that time showed a calcium score of 0.  In July 2020, she was found to be in atrial tachycardia with long cycle Mobitz type I AV block.   Flecainide was increased to 50 mg twice daily and she required repeat cardioversion in July 2020.  Due to recurrent A-fib in early May 2021, she was scheduled for cardioversion but converted prior to completion and flecainide was increased to 100 mg twice daily.  She subsequently underwent implantable loop recorder placement, which continued to show episodes of A-fib.  In September 2023, she was having chest pain in the setting of A-fib and Lexiscan Myoview was performed and nonischemic.  Unfortunately, she required admission in December 2023 secondary to A-fib with RVR in the setting of a UTI.  Cardioversion was performed and successful.  She was transition from flecainide to Tikosyn.  She followed up with Dr. Lalla Brothers and underwent redo A-fib, typical, and atypical atrial flutter ablation in February 2024.  In May 2024, loop monitor interrogation was suspicious for A-fib, though on further review, it was felt to represent sinus rhythm with PACs and brief salvos of atrial tachycardia.   Ms. Wolverton was last seen in cardiology clinic in August 2024, at which time she was doing well.  Frequent PACs noted on implantable loop recorder.  She was very orthostatic in clinic  and metoprolol was reduced to 12.5 mg nightly.  Since her last visit, she is continues to note intermittent palpitations with associated fatigue and mild dyspnea and chest tightness.  The symptoms date back ever since her A-fib diagnosis.  She notes that she is very tired throughout the day but has been helping to take care of her husband who recently had knee surgery.  She is only sleeping 4 to 5 hours per night.  She takes Ambien which helps her fall asleep, but then she awakens frequently and never feels well rested.  Recent interrogation of her implantable loop recorder showed a 3.1% atrial tachycardia burden however, no A-fib.  She continues to have some lightheadedness if standing up too quickly but has not had any syncope.  She denies PND,  orthopnea, edema, or early satiety. Objective  Home Medications    Current Outpatient Medications  Medication Sig Dispense Refill   calcium carbonate (OS-CAL - DOSED IN MG OF ELEMENTAL CALCIUM) 1250 (500 Ca) MG tablet Take 1 tablet by mouth 2 (two) times a week.     cefpodoxime (VANTIN) 200 MG tablet Take 1 tablet (200 mg total) by mouth 2 (two) times daily. 20 tablet 0   cholestyramine light (PREVALITE) 4 g packet Take 1 packet (4 g total) by mouth daily with lunch. 90 each 3   dofetilide (TIKOSYN) 250 MCG capsule TAKE 1 CAPSULE BY MOUTH TWICE DAILY 180 capsule 3   ELIQUIS 5 MG TABS tablet TAKE ONE TABLET BY MOUTH TWICE DAILY 60 tablet 5   ferrous sulfate 325 (65 FE) MG tablet Take 1 tablet (325 mg total) by mouth daily with breakfast.     glucose blood (ONETOUCH ULTRA) test strip CHECK BLOOD SUGAR 2 TIMES A DAY AS DIRECTED DX CODE E11.9 100 strip 3   hydroxypropyl methylcellulose / hypromellose (ISOPTO TEARS / GONIOVISC) 2.5 % ophthalmic solution Place 1 drop into both eyes 2 (two) times daily as needed for dry eyes.     hyoscyamine (LEVBID) 0.375 MG 12 hr tablet Take 1 tablet (0.375 mg total) by mouth 2 (two) times daily. 60 tablet 1   Insulin Glargine (BASAGLAR KWIKPEN) 100 UNIT/ML Inject 8-12 Units into the skin at bedtime. 15 mL 1   Insulin Pen Needle 32G X 4 MM MISC Use 1x a day 100 each 3   magnesium oxide (MAG-OX) 400 MG tablet Take 1 tablet (400 mg total) by mouth 2 (two) times daily. 60 tablet 6   metFORMIN (GLUCOPHAGE-XR) 500 MG 24 hr tablet TAKE TWO TABLETS BY MOUTH TWICE DAILY WITH MEALS 360 tablet 3   metoprolol succinate (TOPROL XL) 25 MG 24 hr tablet Take 0.5 tablets (12.5 mg total) by mouth daily. 45 tablet 1   MYRBETRIQ 50 MG TB24 tablet Take 50 mg by mouth daily as needed.     sertraline (ZOLOFT) 100 MG tablet TAKE 1 TABLET BY MOUTH NIGHTLY 30 tablet 2   traMADol (ULTRAM) 50 MG tablet Take 50 mg by mouth as needed.     zolpidem (AMBIEN) 10 MG tablet Take 5 mg by mouth at  bedtime.     oxyCODONE (OXY IR/ROXICODONE) 5 MG immediate release tablet Take by mouth. (Patient not taking: Reported on 02/12/2023)     No current facility-administered medications for this visit.     Physical Exam    VS:  BP 136/72 (BP Location: Left Arm, Patient Position: Sitting, Cuff Size: Large)   Pulse 81   Ht 5\' 5"  (1.651 m)   Wt 173 lb (  78.5 kg)   LMP  (LMP Unknown)   SpO2 98%   BMI 28.79 kg/m  , BMI Body mass index is 28.79 kg/m.    Orthostatic VS for the past 24 hrs:  BP- Lying Pulse- Lying BP- Sitting Pulse- Sitting BP- Standing at 0 minutes Pulse- Standing at 0 minutes  02/12/23 1608 144/80 80 126/75 82 148/51 88       GEN: Well nourished, well developed, in no acute distress. HEENT: normal. Neck: Supple, no JVD, carotid bruits, or masses. Cardiac: RRR, no murmurs, rubs, or gallops. No clubbing, cyanosis, edema.  Radials 2+/PT 2+ and equal bilaterally.  Respiratory:  Respirations regular and unlabored, clear to auscultation bilaterally. GI: Soft, nontender, nondistended, BS + x 4. MS: no deformity or atrophy. Skin: warm and dry, no rash. Neuro:  Strength and sensation are intact. Psych: Normal affect.  Accessory Clinical Findings    ECG personally reviewed by me today - EKG Interpretation Date/Time:  Wednesday February 12 2023 15:30:15 EST Ventricular Rate:  81 PR Interval:  160 QRS Duration:  84 QT Interval:  396 QTC Calculation: 460 R Axis:   30  Text Interpretation: Normal sinus rhythm Premature atrial complexes Confirmed by Nicolasa Ducking [phone]) on 02/12/2023 3:41:33 PM  - no acute changes.  Lab Results  Component Value Date   WBC 6.3 10/24/2022   HGB 9.7 (L) 10/24/2022   HCT 30.9 (L) 10/24/2022   MCV 85 10/24/2022   PLT 602 (H) 10/24/2022   Lab Results  Component Value Date   CREATININE 1.22 (H) 10/24/2022   BUN 13 10/24/2022   NA 134 10/24/2022   K 5.0 10/24/2022   CL 95 (L) 10/24/2022   CO2 23 10/24/2022   Lab Results   Component Value Date   ALT 8 10/05/2022   AST 16 10/05/2022   ALKPHOS 73 10/05/2022   BILITOT 0.3 10/05/2022   Lab Results  Component Value Date   CHOL 147 10/24/2021   HDL 57.70 10/24/2021   LDLCALC 73 10/24/2021   LDLDIRECT 163.1 07/04/2011   TRIG 79.0 10/24/2021   CHOLHDL 3 10/24/2021    Lab Results  Component Value Date   HGBA1C 7.0 (A) 09/30/2022   Lab Results  Component Value Date   TSH 2.23 06/27/2022       Assessment & Plan    1.  Persistent atrial fibrillation: Patient with history of atrial fibrillation status post PVI x 2.  Most recent event monitoring did not show any new atrial fibrillation though, she does have a 3.1% atrial tachycardia burden (see below).  She remains on Tikosyn and Eliquis.  2.  Paroxysmal atrial tachycardia: 3.1% atrial tachycardia burden noted on most recent remote monitoring of her implantable loop recorder.  She is symptomatic with this, as she has been for many years, with palpitations, mild dyspnea, and mild discomfort in her chest.  Symptoms occur throughout the day and are fairly short-lived.  Overall, it appears the occurrence of her tachycardia has been stable.  We will arrange for follow-up with electrophysiology to discuss further.  She otherwise remains on low-dose metoprolol and Tikosyn.  3.  Primary hypertension: Blood pressure stable today at 136/72.  She was mildly orthostatic when sitting up but this settled out after standing.  She is on low-dose metoprolol.  4.  Type 2 diabetes mellitus: A1c 7.0 in July.  Management per primary care.  5.  Hyperlipidemia: LDL of 73 last August.  She is not on a statin.  6.  Obstructive sleep  apnea: Compliant with CPAP.  7.  Insomnia: Uses Ambien to help her fall asleep at night though has trouble staying asleep.  Plans to follow-up with primary care.  8.  Disposition: Follow-up in electrophysiology clinic in 2 months or sooner if necessary.  Nicolasa Ducking, NP 02/12/2023, 5:55 PM

## 2023-02-17 ENCOUNTER — Ambulatory Visit: Payer: Medicare HMO | Admitting: Cardiology

## 2023-02-18 DIAGNOSIS — J029 Acute pharyngitis, unspecified: Secondary | ICD-10-CM | POA: Diagnosis not present

## 2023-02-18 DIAGNOSIS — H66002 Acute suppurative otitis media without spontaneous rupture of ear drum, left ear: Secondary | ICD-10-CM | POA: Diagnosis not present

## 2023-02-19 DIAGNOSIS — I10 Essential (primary) hypertension: Secondary | ICD-10-CM | POA: Diagnosis not present

## 2023-02-19 DIAGNOSIS — I499 Cardiac arrhythmia, unspecified: Secondary | ICD-10-CM | POA: Diagnosis not present

## 2023-02-19 DIAGNOSIS — E1100 Type 2 diabetes mellitus with hyperosmolarity without nonketotic hyperglycemic-hyperosmolar coma (NKHHC): Secondary | ICD-10-CM | POA: Diagnosis not present

## 2023-02-19 DIAGNOSIS — Z8679 Personal history of other diseases of the circulatory system: Secondary | ICD-10-CM | POA: Diagnosis not present

## 2023-02-19 DIAGNOSIS — K2100 Gastro-esophageal reflux disease with esophagitis, without bleeding: Secondary | ICD-10-CM | POA: Diagnosis not present

## 2023-02-19 DIAGNOSIS — I341 Nonrheumatic mitral (valve) prolapse: Secondary | ICD-10-CM | POA: Diagnosis not present

## 2023-02-19 DIAGNOSIS — Z4509 Encounter for adjustment and management of other cardiac device: Secondary | ICD-10-CM | POA: Diagnosis not present

## 2023-02-24 ENCOUNTER — Encounter: Payer: Medicare HMO | Admitting: Cardiology

## 2023-03-07 DIAGNOSIS — H900 Conductive hearing loss, bilateral: Secondary | ICD-10-CM | POA: Diagnosis not present

## 2023-03-07 DIAGNOSIS — H6982 Other specified disorders of Eustachian tube, left ear: Secondary | ICD-10-CM | POA: Diagnosis not present

## 2023-03-10 ENCOUNTER — Ambulatory Visit (INDEPENDENT_AMBULATORY_CARE_PROVIDER_SITE_OTHER): Payer: Medicare HMO

## 2023-03-10 DIAGNOSIS — I480 Paroxysmal atrial fibrillation: Secondary | ICD-10-CM | POA: Diagnosis not present

## 2023-03-11 LAB — CUP PACEART REMOTE DEVICE CHECK
Date Time Interrogation Session: 20250105230147
Implantable Pulse Generator Implant Date: 20210816

## 2023-03-13 DIAGNOSIS — N3281 Overactive bladder: Secondary | ICD-10-CM | POA: Diagnosis not present

## 2023-03-13 DIAGNOSIS — N390 Urinary tract infection, site not specified: Secondary | ICD-10-CM | POA: Diagnosis not present

## 2023-03-13 DIAGNOSIS — N83201 Unspecified ovarian cyst, right side: Secondary | ICD-10-CM | POA: Diagnosis not present

## 2023-03-13 DIAGNOSIS — E1165 Type 2 diabetes mellitus with hyperglycemia: Secondary | ICD-10-CM | POA: Diagnosis not present

## 2023-03-13 DIAGNOSIS — K2100 Gastro-esophageal reflux disease with esophagitis, without bleeding: Secondary | ICD-10-CM | POA: Diagnosis not present

## 2023-03-13 DIAGNOSIS — E785 Hyperlipidemia, unspecified: Secondary | ICD-10-CM | POA: Diagnosis not present

## 2023-03-13 DIAGNOSIS — G4733 Obstructive sleep apnea (adult) (pediatric): Secondary | ICD-10-CM | POA: Diagnosis not present

## 2023-03-13 DIAGNOSIS — I10 Essential (primary) hypertension: Secondary | ICD-10-CM | POA: Diagnosis not present

## 2023-03-13 DIAGNOSIS — E1169 Type 2 diabetes mellitus with other specified complication: Secondary | ICD-10-CM | POA: Diagnosis not present

## 2023-03-13 DIAGNOSIS — Z8679 Personal history of other diseases of the circulatory system: Secondary | ICD-10-CM | POA: Diagnosis not present

## 2023-03-13 DIAGNOSIS — E538 Deficiency of other specified B group vitamins: Secondary | ICD-10-CM | POA: Diagnosis not present

## 2023-03-13 LAB — HEMOGLOBIN A1C: Hemoglobin A1C: 7.4

## 2023-04-02 ENCOUNTER — Encounter: Payer: Self-pay | Admitting: Internal Medicine

## 2023-04-02 DIAGNOSIS — E119 Type 2 diabetes mellitus without complications: Secondary | ICD-10-CM | POA: Diagnosis not present

## 2023-04-02 DIAGNOSIS — H524 Presbyopia: Secondary | ICD-10-CM | POA: Diagnosis not present

## 2023-04-02 LAB — HM DIABETES EYE EXAM

## 2023-04-04 DIAGNOSIS — Z7984 Long term (current) use of oral hypoglycemic drugs: Secondary | ICD-10-CM | POA: Diagnosis not present

## 2023-04-04 DIAGNOSIS — N83201 Unspecified ovarian cyst, right side: Secondary | ICD-10-CM | POA: Diagnosis not present

## 2023-04-04 DIAGNOSIS — Z79899 Other long term (current) drug therapy: Secondary | ICD-10-CM | POA: Diagnosis not present

## 2023-04-04 DIAGNOSIS — E119 Type 2 diabetes mellitus without complications: Secondary | ICD-10-CM | POA: Diagnosis not present

## 2023-04-04 DIAGNOSIS — I4891 Unspecified atrial fibrillation: Secondary | ICD-10-CM | POA: Diagnosis not present

## 2023-04-04 DIAGNOSIS — R197 Diarrhea, unspecified: Secondary | ICD-10-CM | POA: Diagnosis not present

## 2023-04-04 DIAGNOSIS — N838 Other noninflammatory disorders of ovary, fallopian tube and broad ligament: Secondary | ICD-10-CM | POA: Diagnosis not present

## 2023-04-04 DIAGNOSIS — I2510 Atherosclerotic heart disease of native coronary artery without angina pectoris: Secondary | ICD-10-CM | POA: Diagnosis not present

## 2023-04-04 DIAGNOSIS — Z8719 Personal history of other diseases of the digestive system: Secondary | ICD-10-CM | POA: Diagnosis not present

## 2023-04-04 DIAGNOSIS — G4733 Obstructive sleep apnea (adult) (pediatric): Secondary | ICD-10-CM | POA: Diagnosis not present

## 2023-04-04 DIAGNOSIS — R140 Abdominal distension (gaseous): Secondary | ICD-10-CM | POA: Diagnosis not present

## 2023-04-04 DIAGNOSIS — I10 Essential (primary) hypertension: Secondary | ICD-10-CM | POA: Diagnosis not present

## 2023-04-04 DIAGNOSIS — K219 Gastro-esophageal reflux disease without esophagitis: Secondary | ICD-10-CM | POA: Diagnosis not present

## 2023-04-04 DIAGNOSIS — Z7901 Long term (current) use of anticoagulants: Secondary | ICD-10-CM | POA: Diagnosis not present

## 2023-04-13 DIAGNOSIS — Z8679 Personal history of other diseases of the circulatory system: Secondary | ICD-10-CM | POA: Diagnosis not present

## 2023-04-13 DIAGNOSIS — I499 Cardiac arrhythmia, unspecified: Secondary | ICD-10-CM | POA: Diagnosis not present

## 2023-04-14 ENCOUNTER — Ambulatory Visit: Payer: Medicare HMO

## 2023-04-14 DIAGNOSIS — I480 Paroxysmal atrial fibrillation: Secondary | ICD-10-CM | POA: Diagnosis not present

## 2023-04-14 LAB — CUP PACEART REMOTE DEVICE CHECK
Date Time Interrogation Session: 20250209230628
Implantable Pulse Generator Implant Date: 20210816

## 2023-04-15 DIAGNOSIS — N3946 Mixed incontinence: Secondary | ICD-10-CM | POA: Diagnosis not present

## 2023-04-15 DIAGNOSIS — N3281 Overactive bladder: Secondary | ICD-10-CM | POA: Diagnosis not present

## 2023-04-16 DIAGNOSIS — M19011 Primary osteoarthritis, right shoulder: Secondary | ICD-10-CM | POA: Diagnosis not present

## 2023-04-18 DIAGNOSIS — H6982 Other specified disorders of Eustachian tube, left ear: Secondary | ICD-10-CM | POA: Diagnosis not present

## 2023-04-18 DIAGNOSIS — H903 Sensorineural hearing loss, bilateral: Secondary | ICD-10-CM | POA: Diagnosis not present

## 2023-04-21 ENCOUNTER — Other Ambulatory Visit: Payer: Self-pay | Admitting: Family Medicine

## 2023-04-21 NOTE — Telephone Encounter (Signed)
 Call pt and schedule a appt for cpe

## 2023-04-21 NOTE — Telephone Encounter (Signed)
Please schedule CPE with fasting labs prior with Dr. Bedsole.  

## 2023-04-21 NOTE — Addendum Note (Signed)
Addended by: Geralyn Flash D on: 04/21/2023 12:45 PM   Modules accepted: Orders

## 2023-04-21 NOTE — Progress Notes (Signed)
 [facility] Summary Report / Loop Recorder

## 2023-04-24 ENCOUNTER — Encounter: Payer: Self-pay | Admitting: Internal Medicine

## 2023-04-24 ENCOUNTER — Telehealth: Payer: Self-pay | Admitting: *Deleted

## 2023-04-24 NOTE — Telephone Encounter (Signed)
   Pre-operative Risk Assessment    Patient Name: [name]  DOB: [birth year] MRN: [id number]   Date of last office visit: 04/14/2022 Date of next office visit: None  Request for Surgical Clearance    Procedure:   Right Reverse Total Shoulder Arthroplasty  Date of Surgery:  Clearance TBD                                 Surgeon:  Melina Copa Supple Surgeon's Group or Practice Name:  [facility]  Phone number:  [phone] Fax number:  [phone]   Type of Clearance Requested:   - Medical  - Pharmacy:  Hold Apixaban (Eliquis) Not indicated   Type of Anesthesia:  Not Indicated   Additional requests/questions:  Pt has Medtronic/sent to device.   Signed, Emmit Pomfret   04/24/2023, 9:25 AM

## 2023-04-24 NOTE — Progress Notes (Unsigned)
PERIOPERATIVE PRESCRIPTION FOR IMPLANTED CARDIAC DEVICE PROGRAMMING   Patient Information:  Patient: [name]  MRN: [id number]  Date of Birth: [birth year]  Procedure:   Right Reverse Total Shoulder Arthroplasty Date of Surgery:  Clearance TBD                              Surgeon:  Melina Copa Supple Surgeon's Group or Practice Name:  [facility]  Phone number:  [phone] Fax number:  [phone]   Device Information:   Clinic EP Physician:   Dr. Sherryl Manges Device Type:  Loop Recorder Manufacturer and Phone #:  Medtronic: [id number] Pacemaker Dependent?:  N/A Date of Last Device Check:  04/13/2023        Normal Device Function?:  Yes     Electrophysiologist's Recommendations:   Have magnet available. Provide continuous ECG monitoring when magnet is used or reprogramming is to be performed.  Procedure should not interfere with device function.  No device programming or magnet placement needed.  Per Device Clinic Standing Jarrett Ables, Lenor Coffin  04/24/2023 12:54 PM

## 2023-04-24 NOTE — Telephone Encounter (Signed)
 Pharmacy please advise on holding Eliquis prior to right reverse total shoulder arthroplasty scheduled for TBD. Thank you.

## 2023-04-25 ENCOUNTER — Telehealth: Payer: Self-pay

## 2023-04-25 NOTE — Telephone Encounter (Signed)
 Patient has been scheduled for telephone visit.

## 2023-04-25 NOTE — Telephone Encounter (Signed)
   Name: [name]  DOB: 22-May-1945  MRN: [id number]  Primary Cardiologist: Lorine Bears, MD   Preoperative team, please contact this patient and set up a phone call appointment for further preoperative risk assessment. Please obtain consent and complete medication review. Thank you for your help. Last seen by Ward Givens on 02/12/2023  I confirm that guidance regarding antiplatelet and oral anticoagulation therapy has been completed and, if necessary, noted below.  Per office protocol, patient can hold Eliquis for 3 days prior to procedure.   Patient will not need bridging with Lovenox (enoxaparin) around procedure.  I also confirmed the patient resides in the state of [place]. As per [facility] Medical Board telemedicine laws, the patient must reside in the state in which the provider is licensed.   Joni Reining, NP 04/25/2023, 9:35 AM [facility]

## 2023-04-25 NOTE — Telephone Encounter (Signed)
Patient with diagnosis of atrial fibrillation on Eliquis for anticoagulation.    Procedure:   Right Reverse Total Shoulder Arthroplasty   Date of Surgery:  Clearance TBD    CHA2DS2-VASc Score = 5   This indicates a 7.2% annual risk of stroke. The patient's score is based upon: CHF History: 0 HTN History: 1 Diabetes History: 1 Stroke History: 0 Vascular Disease History: 0 Age Score: 2 Gender Score: 1    CrCl 48 Platelet count 373  Per office protocol, patient can hold Eliquis for 3 days prior to procedure.   Patient will not need bridging with Lovenox (enoxaparin) around procedure.  **This guidance is not considered finalized until pre-operative APP has relayed final recommendations.**

## 2023-04-25 NOTE — Telephone Encounter (Signed)
Patient has been scheduled for telephone visit and consent done     Patient Consent for Virtual Visit         [name] has provided verbal consent on 04/25/2023 for a virtual visit (video or telephone).   CONSENT FOR VIRTUAL VISIT FOR:  [name]  By participating in this virtual visit I agree to the following:  I hereby voluntarily request, consent and authorize [facility] and its employed or contracted physicians, physician assistants, nurse practitioners or other licensed health care professionals (the Practitioner), to provide me with telemedicine health care services (the "Services") as deemed necessary by the treating Practitioner. I acknowledge and consent to receive the Services by the Practitioner via telemedicine. I understand that the telemedicine visit will involve communicating with the Practitioner through live audiovisual communication technology and the disclosure of certain medical information by electronic transmission. I acknowledge that I have been given the opportunity to request an in-person assessment or other available alternative prior to the telemedicine visit and am voluntarily participating in the telemedicine visit.  I understand that I have the right to withhold or withdraw my consent to the use of telemedicine in the course of my care at any time, without affecting my right to future care or treatment, and that the Practitioner or I may terminate the telemedicine visit at any time. I understand that I have the right to inspect all information obtained and/or recorded in the course of the telemedicine visit and may receive copies of available information for a reasonable fee.  I understand that some of the potential risks of receiving the Services via telemedicine include:  Delay or interruption in medical evaluation due to technological equipment failure or disruption; Information transmitted may not be sufficient (e.g. poor resolution of images)  to allow for appropriate medical decision making by the Practitioner; and/or  In rare instances, security protocols could fail, causing a breach of personal health information.  Furthermore, I acknowledge that it is my responsibility to provide information about my medical history, conditions and care that is complete and accurate to the best of my ability. I acknowledge that Practitioner's advice, recommendations, and/or decision may be based on factors not within their control, such as incomplete or inaccurate data provided by me or distortions of diagnostic images or specimens that may result from electronic transmissions. I understand that the practice of medicine is not an exact science and that Practitioner makes no warranties or guarantees regarding treatment outcomes. I acknowledge that a copy of this consent can be made available to me via my patient portal [facility] MyChart), or I can request a printed copy by calling the office of  [facility].    I understand that my insurance will be billed for this visit.   I have read or had this consent read to me. I understand the contents of this consent, which adequately explains the benefits and risks of the Services being provided via telemedicine.  I have been provided ample opportunity to ask questions regarding this consent and the Services and have had my questions answered to my satisfaction. I give my informed consent for the services to be provided through the use of telemedicine in my medical care

## 2023-04-29 ENCOUNTER — Ambulatory Visit (INDEPENDENT_AMBULATORY_CARE_PROVIDER_SITE_OTHER): Payer: Medicare HMO | Admitting: Family Medicine

## 2023-04-29 VITALS — BP 110/60 | HR 88 | Temp 98.2°F | Ht 65.0 in | Wt 177.0 lb

## 2023-04-29 DIAGNOSIS — E119 Type 2 diabetes mellitus without complications: Secondary | ICD-10-CM

## 2023-04-29 DIAGNOSIS — Z7984 Long term (current) use of oral hypoglycemic drugs: Secondary | ICD-10-CM

## 2023-04-29 DIAGNOSIS — D508 Other iron deficiency anemias: Secondary | ICD-10-CM | POA: Diagnosis not present

## 2023-04-29 DIAGNOSIS — E1159 Type 2 diabetes mellitus with other circulatory complications: Secondary | ICD-10-CM

## 2023-04-29 DIAGNOSIS — I152 Hypertension secondary to endocrine disorders: Secondary | ICD-10-CM | POA: Diagnosis not present

## 2023-04-29 DIAGNOSIS — Z01818 Encounter for other preprocedural examination: Secondary | ICD-10-CM

## 2023-04-29 DIAGNOSIS — Z794 Long term (current) use of insulin: Secondary | ICD-10-CM | POA: Diagnosis not present

## 2023-04-29 NOTE — Progress Notes (Signed)
 Patient ID: [name], female    DOB: [birth year], 78 y.o.   MRN: [id number]  This visit was conducted in person.  BP 110/60 (BP Location: Left Arm, Patient Position: Sitting, Cuff Size: Large)   Pulse 88   Temp 98.2 F (36.8 C) (Temporal)   Ht 5\' 5"  (1.651 m)   Wt 177 lb (80.3 kg)   LMP  (LMP Unknown)   SpO2 98%   BMI 29.45 kg/m    CC:  Chief Complaint  Patient presents with   Surgical Clearance    Subjective:   HPI: [name] is a 78 y.o. female presenting on 04/29/2023 for Surgical Clearance  She is scheduled for right reverse total shoulder arthroplasty date to be decided As planned telephone cardiac clearance office visit given atrial fibrillation on Eliquis for anticoagulation.   She has left shoulder replacement done 06/2022... no complications after surgery.  She is able to walk up one flight of stairs without SOB, able to vacuum.  Walking and house daily, with no symptoms.  Hypertension:    Well controlled. BP Readings from Last 3 Encounters:  04/29/23 110/60  02/12/23 136/72  01/07/23 122/68  Using medication without problems or lightheadedness:  none Chest pain with exertion: none Edema:none Short of breath: none Average home BPs: Other issues:  Diabetes: Improved control  on   Ozempic 0.25 weekly,  Not currently taking Baslaglar,metfomrin XR 500 mg 2 x 500 mg  BID    Has follow up with ENDO in April Lab Results  Component Value Date   HGBA1C 7.4 03/13/2023  Using medications without difficulties: Hypoglycemic episodes:none Hyperglycemic episodes: none Feet problems:none Blood Sugars averaging: FBS 107-157 eye exam within last year:  due  Wt Readings from Last 3 Encounters:  04/29/23 177 lb (80.3 kg)  02/12/23 173 lb (78.5 kg)  01/07/23 179 lb (81.2 kg)      Elevated Cholesterol: LDL at goal less than 70 on atorvastatin 10 mg p.o. daily. Lab Results  Component Value Date   CHOL 147 10/24/2021   HDL 57.70 10/24/2021   LDLCALC  73 10/24/2021   LDLDIRECT 163.1 07/04/2011   TRIG 79.0 10/24/2021   CHOLHDL 3 10/24/2021  Using medications without problems: none Muscle aches:  none Diet compliance:  good Exercise: minimal...  Other complaints:   Atrial fibrillation, followed by cardiology On eliquis, tikosyn   Iron deficiency anemia, most recent hemoglobin at 10.6.Emily KitchenMarland King Stored iron ferritin low at 8.  On iron.  No blood in stool or urine, no nosebleeds, no surgery.  Relevant past medical, surgical, family and social history reviewed and updated as indicated. Interim medical history since our last visit reviewed. Allergies and medications reviewed and updated. Outpatient Medications Prior to Visit  Medication Sig Dispense Refill   OZEMPIC, 0.25 OR 0.5 MG/DOSE, 2 MG/3ML SOPN Inject 0.25 mg into the skin once a week.     calcium carbonate (OS-CAL - DOSED IN MG OF ELEMENTAL CALCIUM) 1250 (500 Ca) MG tablet Take 1 tablet by mouth 2 (two) times a week.     dofetilide (TIKOSYN) 250 MCG capsule TAKE 1 CAPSULE BY MOUTH TWICE DAILY 180 capsule 3   ELIQUIS 5 MG TABS tablet TAKE ONE TABLET BY MOUTH TWICE DAILY 60 tablet 5   ferrous sulfate 325 (65 FE) MG tablet Take 1 tablet (325 mg total) by mouth daily with breakfast.     glucose blood (ONETOUCH ULTRA) test strip CHECK BLOOD SUGAR 2 TIMES A DAY AS DIRECTED DX CODE  E11.9 100 strip 3   hydroxypropyl methylcellulose / hypromellose (ISOPTO TEARS / GONIOVISC) 2.5 % ophthalmic solution Place 1 drop into both eyes 2 (two) times daily as needed for dry eyes.     hyoscyamine (LEVBID) 0.375 MG 12 hr tablet Take 1 tablet (0.375 mg total) by mouth 2 (two) times daily. 60 tablet 1   Insulin Glargine (BASAGLAR KWIKPEN) 100 UNIT/ML Inject 8-12 Units into the skin at bedtime. 15 mL 1   Insulin Pen Needle 32G X 4 MM MISC Use 1x a day 100 each 3   magnesium oxide (MAG-OX) 400 MG tablet Take 1 tablet (400 mg total) by mouth 2 (two) times daily. 60 tablet 6   metFORMIN (GLUCOPHAGE-XR) 500 MG 24  hr tablet TAKE TWO TABLETS BY MOUTH TWICE DAILY WITH MEALS 360 tablet 3   metoprolol succinate (TOPROL XL) 25 MG 24 hr tablet Take 0.5 tablets (12.5 mg total) by mouth daily. 45 tablet 1   pantoprazole (PROTONIX) 40 MG tablet Take 40 mg by mouth 2 (two) times daily.     rosuvastatin (CRESTOR) 5 MG tablet Take 5 mg by mouth daily.     sertraline (ZOLOFT) 100 MG tablet TAKE 1 TABLET BY MOUTH NIGHTLY 30 tablet 0   traMADol (ULTRAM) 50 MG tablet Take 50 mg by mouth as needed.     zolpidem (AMBIEN) 10 MG tablet Take 5 mg by mouth at bedtime.     cefpodoxime (VANTIN) 200 MG tablet Take 1 tablet (200 mg total) by mouth 2 (two) times daily. 20 tablet 0   cholestyramine light (PREVALITE) 4 g packet Take 1 packet (4 g total) by mouth daily with lunch. 90 each 3   MYRBETRIQ 50 MG TB24 tablet Take 50 mg by mouth daily as needed.     oxyCODONE (OXY IR/ROXICODONE) 5 MG immediate release tablet Take by mouth. (Patient not taking: Reported on 02/12/2023)     No facility-administered medications prior to visit.     Per HPI unless specifically indicated in ROS section below Review of Systems  Constitutional:  Negative for fatigue and fever.  HENT:  Negative for congestion.   Eyes:  Negative for pain.  Respiratory:  Negative for cough and shortness of breath.   Cardiovascular:  Negative for chest pain, palpitations and leg swelling.  Gastrointestinal:  Negative for abdominal pain.  Genitourinary:  Negative for dysuria and vaginal bleeding.  Musculoskeletal:  Negative for back pain.  Neurological:  Negative for syncope, light-headedness and headaches.  Psychiatric/Behavioral:  Negative for dysphoric mood.    Objective:  BP 110/60 (BP Location: Left Arm, Patient Position: Sitting, Cuff Size: Large)   Pulse 88   Temp 98.2 F (36.8 C) (Temporal)   Ht 5\' 5"  (1.651 m)   Wt 177 lb (80.3 kg)   LMP  (LMP Unknown)   SpO2 98%   BMI 29.45 kg/m   Wt Readings from Last 3 Encounters:  04/29/23 177 lb (80.3 kg)   02/12/23 173 lb (78.5 kg)  01/07/23 179 lb (81.2 kg)      Physical Exam Constitutional:      General: She is not in acute distress.    Appearance: Normal appearance. She is well-developed. She is not ill-appearing or toxic-appearing.  HENT:     Head: Normocephalic.     Right Ear: Hearing, tympanic membrane, ear canal and external ear normal. Tympanic membrane is not erythematous, retracted or bulging.     Left Ear: Hearing, tympanic membrane, ear canal and external ear normal. Tympanic membrane  is not erythematous, retracted or bulging.     Nose: No mucosal edema or rhinorrhea.     Right Sinus: No maxillary sinus tenderness or frontal sinus tenderness.     Left Sinus: No maxillary sinus tenderness or frontal sinus tenderness.     Mouth/Throat:     Pharynx: Uvula midline.  Eyes:     General: Lids are normal. Lids are everted, no foreign bodies appreciated.     Conjunctiva/sclera: Conjunctivae normal.     Pupils: Pupils are equal, round, and reactive to light.  Neck:     Thyroid: No thyroid mass or thyromegaly.     Vascular: No carotid bruit.     Trachea: Trachea normal.  Cardiovascular:     Rate and Rhythm: Normal rate. Rhythm irregularly irregular.     Pulses: Normal pulses.     Heart sounds: Normal heart sounds, S1 normal and S2 normal. No murmur heard.    No friction rub. No gallop.  Pulmonary:     Effort: Pulmonary effort is normal. No tachypnea or respiratory distress.     Breath sounds: Normal breath sounds. No decreased breath sounds, wheezing, rhonchi or rales.  Abdominal:     General: Bowel sounds are normal.     Palpations: Abdomen is soft.     Tenderness: There is no abdominal tenderness.  Musculoskeletal:     Cervical back: Normal range of motion and neck supple.  Skin:    General: Skin is warm and dry.     Findings: No rash.  Neurological:     Mental Status: She is alert.  Psychiatric:        Mood and Affect: Mood is not anxious or depressed.        Speech:  Speech normal.        Behavior: Behavior normal. Behavior is cooperative.        Thought Content: Thought content normal.        Judgment: Judgment normal.         Results for orders placed or performed in visit on 04/29/23  Protein / creatinine ratio, urine   Collection Time: 12/09/22 12:00 AM  Result Value Ref Range   Albumin, U 6.2   Microalbumin / creatinine urine ratio   Collection Time: 12/09/22 12:00 AM  Result Value Ref Range   Microalb Creat Ratio 9.7   Hemoglobin A1c   Collection Time: 03/13/23 12:00 AM  Result Value Ref Range   Hemoglobin A1C 7.4    *Note: Due to a large number of results and/or encounters for the requested time period, some results have not been displayed. A complete set of results can be found in Results Review.     COVID 19 screen:  No recent travel or known exposure to COVID19 The patient denies respiratory symptoms of COVID 19 at this time. The importance of social distancing was discussed today.   Assessment and Plan   Problem List Items Addressed This Visit     Controlled diabetes mellitus type II without complication (HCC) (Chronic)   Chronic, previously well controlled on   ozempic.  She is followed by endocrinology      Relevant Medications   rosuvastatin (CRESTOR) 5 MG tablet   OZEMPIC, 0.25 OR 0.5 MG/DOSE, 2 MG/3ML SOPN   Hypertension associated with diabetes (HCC) (Chronic)   Chronic, blood pressure well controlled on metoprolol 50 mg XL p.o. daily      Relevant Medications   rosuvastatin (CRESTOR) 5 MG tablet   OZEMPIC, 0.25 OR  0.5 MG/DOSE, 2 MG/3ML SOPN   Iron deficiency anemia (Chronic)    Chronic, due for recheck pre-op.   On ferrous sulfate 325 mg daily.      Pre-operative clearance - Primary   Patient able to tolerate 3-4 METS of exercise. Diabetes tolerably controlled with current Ozempic and metformin.  She is not currently taking Hospital doctor.  Fasting blood sugars at goal. No current pulmonary issues. A-fib  history on Eliquis and Tikosyn.  Patient currently in sinus rhythm. Chronic iron deficiency anemia on ferrous sulfate 325 mg daily.  Patient will need hemoglobin lab prior to surgery..  Labs to be done with anesthesia preop eval.  Overall patient has well-controlled mulitple health issues that put her at moderate risk for this moderate risk surgery, but she has done well with surgeries in the past.  Last years shoulder replacement was successful without complications. She can hold Eliquis 3 days prior to procedure unless otherwise recommended by cardiology. Cardiology preop visit pending.         Kerby Nora, MD

## 2023-04-29 NOTE — Assessment & Plan Note (Signed)
 Chronic, blood pressure well controlled on metoprolol 50 mg XL p.o. daily

## 2023-04-29 NOTE — Assessment & Plan Note (Signed)
 Chronic, previously well controlled on   ozempic.  She is followed by endocrinology

## 2023-04-29 NOTE — Assessment & Plan Note (Signed)
 Chronic, due for recheck pre-op.   On ferrous sulfate 325 mg daily.

## 2023-04-29 NOTE — Assessment & Plan Note (Signed)
 Patient able to tolerate 3-4 METS of exercise. Diabetes tolerably controlled with current Ozempic and metformin.  She is not currently taking Hospital doctor.  Fasting blood sugars at goal. No current pulmonary issues. A-fib history on Eliquis and Tikosyn.  Patient currently in sinus rhythm. Chronic iron deficiency anemia on ferrous sulfate 325 mg daily.  Patient will need hemoglobin lab prior to surgery..  Labs to be done with anesthesia preop eval.  Overall patient has well-controlled mulitple health issues that put her at moderate risk for this moderate risk surgery, but she has done well with surgeries in the past.  Last years shoulder replacement was successful without complications. She can hold Eliquis 3 days prior to procedure unless otherwise recommended by cardiology. Cardiology preop visit pending.

## 2023-05-05 ENCOUNTER — Encounter: Payer: Self-pay | Admitting: Internal Medicine

## 2023-05-07 NOTE — Progress Notes (Unsigned)
 Virtual Visit via Telephone Note   Because of [name] co-morbid illnesses, she is at least at moderate risk for complications without adequate follow up.  This format is felt to be most appropriate for this patient at this time.  Due to technical limitations with video connection (technology), today's appointment will be conducted as an audio only telehealth visit, and [name] verbally agreed to proceed in this manner.   All issues noted in this document were discussed and addressed.  No physical exam could be performed with this format.  Evaluation Performed:  Preoperative cardiovascular risk assessment _____________   Date:  05/08/2023   Patient ID:  [name], DOB 08-19-1945, MRN [id number] Patient Location:  Home Provider location:   Office  Primary Care Provider:  [name], MD Primary Cardiologist:  Lorine Bears, MD  Chief Complaint / Patient Profile   78 y.o. 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  History of Present Illness    [name] is a 78 y.o. female who presents via Web designer for a telehealth visit today.  Pt was last seen in cardiology clinic on 02/12/2023 by Ward Givens, NP.  At that time MARIAM HELBERT was continuing to have palpitations with fatigue and mild dyspnea and chest tightness that have been ongoing for several years and short-lived.  She was advised to follow-up with EP and continue low-dose metoprolol. The patient is now pending procedure as outlined above. Since her last visit, she has been doing better and is no longer on Tikosyn or metoprolol.  She reports following up with a cardiologist at [facility] and was started on Multaq and has tolerated medications without any adverse reactions.  She  reports that she no longer needs Ambien to sleep and is feeling much better.  She denies chest pain, shortness of breath, lower extremity edema, fatigue, palpitations, melena, hematuria, hemoptysis, diaphoresis, weakness, presyncope, syncope, orthopnea, and PND.    Past Medical History    Past Medical History:  Diagnosis Date   Anemia    Anxiety    Chest pain    a. 09/2013 MV: Low risk; b. 12/2016 Coronary CTA/morphology: Ca2+ = 0; c. 11/2021 MV: EF 75%, no isch/infarct. Ao atherosclerosis noted. No signif Cor Ca2+.   Colon polyps    Constipation    Depressive disorder, not elsewhere classified    Diabetes mellitus without complication (HCC)    Diaphragmatic hernia without mention of obstruction or gangrene    Diastolic dysfunction    a. 01/2022 Echo: EF 55-60%, no rwma, GrII DD, nl RV size/fxn, RVSP 33.16mmHg. Sev dil LA, mild MR.   Disorders of bursae and tendons in shoulder region, unspecified    Diverticulosis of colon (without mention of hemorrhage)    External hemorrhoid    Fundic gland polyps of stomach, benign    GERD (gastroesophageal reflux disease)    History of hiatal hernia    HTN (hypertension)    Hypertension    Hypothyroidism    Internal hemorrhoids without mention of complication    Mitral regurgitation    a. 01/2022 Echo: Mild MR.   Obesity, unspecified    OSA on CPAP    compliant with CPAP   Other urinary incontinence    Persistent atrial fibrillation (HCC)    a.  Dx 09/2013 s/p multiple DCCVS; b. 12/2016 s/p PVI; c. 2023 Flecainide changed to Tikosyn; d. 04/2022 s/p redo PVI/fib/flutter ablation.   Pure hypercholesterolemia    Tubular adenoma of colon    Type II diabetes mellitus (HCC)    Urinary frequency    Urinary urgency    Vitamin B 12 deficiency    Past Surgical History:  Procedure Laterality Date   ABDOMINAL HYSTERECTOMY  1972   Have both ovaries   ATRIAL FIBRILLATION ABLATION  12/17/2016   ATRIAL FIBRILLATION ABLATION N/A 12/17/2016    Procedure: ATRIAL FIBRILLATION ABLATION;  Surgeon: [name], MD;  Location: [facility];  Service: Cardiovascular;  Laterality: N/A;   ATRIAL FIBRILLATION ABLATION N/A 04/23/2022   Procedure: ATRIAL FIBRILLATION ABLATION;  Surgeon: [name], MD;  Location: [facility];  Service: Cardiovascular;  Laterality: N/A;   CARDIAC CATHETERIZATION  1990s   [facility]; "[name]"   CARDIOVERSION N/A 05/27/2016   Procedure: CARDIOVERSION;  Surgeon: [name], MD;  Location: [facility];  Service: Cardiovascular;  Laterality: N/A;   CARDIOVERSION N/A 11/25/2016   Procedure: CARDIOVERSION;  Surgeon: [name], MD;  Location: [facility];  Service: Cardiovascular;  Laterality: N/A;   CARDIOVERSION N/A 09/18/2018   Procedure: CARDIOVERSION;  Surgeon: [name], MD;  Location: [facility];  Service: Cardiovascular;  Laterality: N/A;   CARDIOVERSION N/A 07/28/2019   Procedure: CARDIOVERSION;  Surgeon: [name], MD;  Location: [facility];  Service: Cardiovascular;  Laterality: N/A;   CARDIOVERSION N/A 09/07/2019   Procedure: CARDIOVERSION;  Surgeon: [name], MD;  Location: [facility];  Service: Cardiovascular;  Laterality: N/A;   CARDIOVERSION N/A 02/19/2022   Procedure: CARDIOVERSION;  Surgeon: [name], MD;  Location: [facility];  Service: Cardiovascular;  Laterality: N/A;   CATARACT EXTRACTION W/ INTRAOCULAR LENS  IMPLANT, BILATERAL Bilateral    COLONOSCOPY     ELECTROPHYSIOLOGIC STUDY N/A 01/24/2016   Procedure: CARDIOVERSION;  Surgeon: [name], MD;  Location: [facility];  Service: Cardiovascular;  Laterality: N/A;   implantable loop recorder implantation  10/18/2019   Medtronic Reveal [place] model ZOX09 [id number]) implantable loop recorder    LAPAROSCOPIC CHOLECYSTECTOMY     never stimulator     for her bladder   REDUCTION MAMMAPLASTY Bilateral    SHOULDER SURGERY Left 10/03/2022   TONSILLECTOMY     TOTAL HIP ARTHROPLASTY Right  05/12/2018   Procedure: TOTAL HIP ARTHROPLASTY ANTERIOR APPROACH;  Surgeon: Durene Romans, MD;  Location: [facility];  Service: Orthopedics;  Laterality: Right;  70 mins    Allergies  Allergies  Allergen Reactions   Pravachol [Pravastatin Sodium] Other (See Comments)    Leg pain    Cortisone Rash    Cortisone injection NOT PREDNISONE >40 years ago.. caused rash in setting of treating poison ivy? If true reaction. Has tolerated prednisolone in past in form of eye drops    Home Medications    Prior to Admission medications   Medication Sig Start Date End Date Taking? Authorizing Provider  calcium carbonate (OS-CAL - DOSED IN MG OF ELEMENTAL CALCIUM) 1250 (500 Ca) MG tablet Take 1 tablet by mouth 2 (two) times a week.    [provider]  dofetilide (TIKOSYN) 250 MCG capsule TAKE 1 CAPSULE BY MOUTH TWICE DAILY 11/05/22   Duke Salvia, MD  ELIQUIS 5 MG TABS tablet TAKE ONE TABLET BY MOUTH TWICE DAILY 12/30/22   [name], MD  ferrous sulfate 325 (65 FE) MG tablet Take 1 tablet (  325 mg total) by mouth daily with breakfast. 10/11/22   Iva Boop, MD  glucose blood (ONETOUCH ULTRA) test strip CHECK BLOOD SUGAR 2 TIMES A DAY AS DIRECTED DX CODE E11.9 08/26/22   Carlus Pavlov, MD  hydroxypropyl methylcellulose / hypromellose (ISOPTO TEARS / GONIOVISC) 2.5 % ophthalmic solution Place 1 drop into both eyes 2 (two) times daily as needed for dry eyes.    [provider]  hyoscyamine (LEVBID) 0.375 MG 12 hr tablet Take 1 tablet (0.375 mg total) by mouth 2 (two) times daily. 01/07/23   Iva Boop, MD  Insulin Glargine [facility]) 100 UNIT/ML Inject 8-12 Units into the skin at bedtime. 08/29/22   Carlus Pavlov, MD  Insulin Pen Needle 32G X 4 MM MISC Use 1x a day 08/26/22   Carlus Pavlov, MD  magnesium oxide (MAG-OX) 400 MG tablet Take 1 tablet (400 mg total) by mouth 2 (two) times daily. 05/11/21   Graciella Freer, PA-C  metFORMIN (GLUCOPHAGE-XR) 500  MG 24 hr tablet TAKE TWO TABLETS BY MOUTH TWICE DAILY WITH MEALS 08/26/22   Carlus Pavlov, MD  metoprolol succinate (TOPROL XL) 25 MG 24 hr tablet Take 0.5 tablets (12.5 mg total) by mouth daily. 10/24/22   Sherie Don, NP  OZEMPIC, 0.25 OR 0.5 MG/DOSE, 2 MG/3ML SOPN Inject 0.25 mg into the skin once a week. 03/13/23   [provider]  pantoprazole (PROTONIX) 40 MG tablet Take 40 mg by mouth 2 (two) times daily.    [provider]  rosuvastatin (CRESTOR) 5 MG tablet Take 5 mg by mouth daily.    [provider]  sertraline (ZOLOFT) 100 MG tablet TAKE 1 TABLET BY MOUTH NIGHTLY 04/21/23   Bedsole, Amy E, MD  traMADol (ULTRAM) 50 MG tablet Take 50 mg by mouth as needed. 08/05/22   [provider]  zolpidem (AMBIEN) 10 MG tablet Take 5 mg by mouth at bedtime. 02/05/22   [provider]    Physical Exam    Vital Signs:  Samirah Scarpati [place] does not have vital signs available for review today.  Given telephonic nature of communication, physical exam is limited. AAOx3. NAD. Normal affect.  Speech and respirations are unlabored.  Accessory Clinical Findings    None  Assessment & Plan    1.  Preoperative Cardiovascular Risk Assessment: -Patient's RCRI score is 6.6%  The patient affirms she has been doing well without any new cardiac symptoms. They are able to achieve 6 METS without cardiac limitations. Therefore, based on ACC/AHA guidelines, the patient would be at acceptable risk for the planned procedure without further cardiovascular testing. The patient was advised that if she develops new symptoms prior to surgery to contact our office to arrange for a follow-up visit, and she verbalized understanding.   The patient was advised that if she develops new symptoms prior to surgery to contact our office to arrange for a follow-up visit, and she verbalized understanding.  Patient can hold Eliquis 3 days prior to procedure  A copy of this note will be  routed to requesting surgeon.  Time:   Today, I have spent 8 minutes with the patient with telehealth technology discussing medical history, symptoms, and management plan.     Napoleon Form, Leodis Rains, NP  05/08/23 2:23 PM

## 2023-05-08 ENCOUNTER — Other Ambulatory Visit: Payer: Self-pay | Admitting: Nurse Practitioner

## 2023-05-08 ENCOUNTER — Ambulatory Visit: Payer: Medicare HMO | Attending: Nurse Practitioner

## 2023-05-08 DIAGNOSIS — Z01810 Encounter for preprocedural cardiovascular examination: Secondary | ICD-10-CM | POA: Diagnosis not present

## 2023-05-15 ENCOUNTER — Ambulatory Visit
Admission: EM | Admit: 2023-05-15 | Discharge: 2023-05-15 | Disposition: A | Discharge status: Home / Self Care | Attending: Emergency Medicine | Admitting: Emergency Medicine

## 2023-05-15 DIAGNOSIS — R1030 Lower abdominal pain, unspecified: Secondary | ICD-10-CM

## 2023-05-15 DIAGNOSIS — R350 Frequency of micturition: Secondary | ICD-10-CM

## 2023-05-15 LAB — POCT URINALYSIS DIP (MANUAL ENTRY)
Bilirubin, UA: NEGATIVE
Glucose, UA: NEGATIVE mg/dL
Ketones, POC UA: NEGATIVE mg/dL
Leukocytes, UA: NEGATIVE
Nitrite, UA: NEGATIVE
Protein Ur, POC: NEGATIVE mg/dL
Spec Grav, UA: 1.015 (ref 1.010–1.025)
Urobilinogen, UA: 0.2 U/dL
pH, UA: 6 (ref 5.0–8.0)

## 2023-05-15 NOTE — ED Triage Notes (Signed)
 Patient to Urgent Care with complaints of urinary urgency/ frequency that started yesterday.   Denies any known fevers/ no dysuria.

## 2023-05-15 NOTE — Discharge Instructions (Addendum)
 Your urine does not show signs of infection today.  It does have trace blood and will need to be rechecked by your primary care provider next week.    Go to the emergency department if you have worsening symptoms.

## 2023-05-15 NOTE — ED Provider Notes (Signed)
 [name]    CSN: [id number] Arrival date & time: 05/15/23  1147      History   Chief Complaint Chief Complaint  Patient presents with   Urinary Frequency    HPI [name] is a 78 y.o. female.  Patient presents with 1 day history of urinary frequency, urinary urgency, lower abdominal discomfort.  No fever, dysuria, hematuria, flank pain.  No OTC medications taken.  Her medical history includes cystitis, pyelonephritis, diabetes, hypertension, atrial fibrillation, hypercoagulable state due to persistent atrial fibrillation.  She is on Eliquis.  The history is provided by the patient and medical records.    Past Medical History:  Diagnosis Date   Anemia    Anxiety    Chest pain    a. 09/2013 MV: Low risk; b. 12/2016 Coronary CTA/morphology: Ca2+ = 0; c. 11/2021 MV: EF 75%, no isch/infarct. Ao atherosclerosis noted. No signif Cor Ca2+.   Colon polyps    Constipation    Depressive disorder, not elsewhere classified    Diabetes mellitus without complication (HCC)    Diaphragmatic hernia without mention of obstruction or gangrene    Diastolic dysfunction    a. 01/2022 Echo: EF 55-60%, no rwma, GrII DD, nl RV size/fxn, RVSP 33.28mmHg. Sev dil LA, mild MR.   Disorders of bursae and tendons in shoulder region, unspecified    Diverticulosis of colon (without mention of hemorrhage)    External hemorrhoid    Fundic gland polyps of stomach, benign    GERD (gastroesophageal reflux disease)    History of hiatal hernia    HTN (hypertension)    Hypertension    Hypothyroidism    Internal hemorrhoids without mention of complication    Mitral regurgitation    a. 01/2022 Echo: Mild MR.   Obesity, unspecified    OSA on CPAP    compliant with CPAP   Other urinary incontinence    Persistent atrial fibrillation (HCC)    a. Dx 09/2013 s/p multiple DCCVS; b. 12/2016 s/p PVI; c. 2023 Flecainide changed to Tikosyn; d. 04/2022 s/p redo PVI/fib/flutter ablation.   Pure  hypercholesterolemia    Tubular adenoma of colon    Type II diabetes mellitus (HCC)    Urinary frequency    Urinary urgency    Vitamin B 12 deficiency     Patient Active Problem List   Diagnosis Date Noted   Acute pyelonephritis 11/29/2022   Acute cystitis with hematuria 08/22/2022   Right ovarian cyst 07/10/2022   Drug-induced acute pancreatitis without infection or necrosis 07/10/2022   RUQ pain 06/27/2022   Adhesive capsulitis of right shoulder 03/05/2022   Dyslipidemia 02/19/2022   Depression 02/19/2022   Type 2 diabetes mellitus without complications (HCC) 02/19/2022   Atrial fibrillation with rapid ventricular response (HCC) 02/18/2022   GAD (generalized anxiety disorder) 10/26/2021   Chronic insomnia 10/26/2021   Closed fracture of metacarpal bone 02/27/2021   Intractable acute post-traumatic headache 02/20/2021   Hypercoagulable state due to persistent atrial fibrillation (HCC) 11/27/2020   Atrial tachycardia (HCC) 09/15/2018   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   Status post total hip replacement, 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   Lumbar radiculopathy 01/05/2018   Vitamin B 12 deficiency    Urge incontinence    OSA on CPAP    History of hiatal hernia    GERD (gastroesophageal reflux disease)    Class 1 obesity with serious comorbidity and body mass index (BMI) of 30.0 to 30.9 in adult 01/02/2017   Overweight (BMI 25.0-29.9) 12/19/2016  Persistent atrial fibrillation (HCC) 12/17/2016   Vitamin D deficiency 11/20/2016   Family history of ovarian cancer 05/24/2016   Iron deficiency anemia    Hypertension associated with diabetes (HCC)    Pre-operative clearance 05/16/2015   Controlled diabetes mellitus type II without complication (HCC) 05/12/2015   Memory loss 05/13/2014   Mitral regurgitation 10/11/2013   Incidental lung nodule 07/16/2011   SCIATICA, RIGHT 04/10/2010   Hyperlipidemia associated with type 2 diabetes mellitus (HCC) 06/11/2006   MDD (major depressive disorder), recurrent  episode, mild (HCC) 06/11/2006   HEMORRHOIDS, INTERNAL 06/11/2006   DIVERTICULOSIS, COLON 06/11/2006   STRESS INCONTINENCE 06/11/2006    Past Surgical History:  Procedure Laterality Date   ABDOMINAL HYSTERECTOMY  1972   Have both ovaries   ATRIAL FIBRILLATION ABLATION  12/17/2016   ATRIAL FIBRILLATION ABLATION N/A 12/17/2016   Procedure: ATRIAL FIBRILLATION ABLATION;  Surgeon: [name], MD;  Location: [facility];  Service: Cardiovascular;  Laterality: N/A;   ATRIAL FIBRILLATION ABLATION N/A 04/23/2022   Procedure: ATRIAL FIBRILLATION ABLATION;  Surgeon: [name], MD;  Location: [facility];  Service: Cardiovascular;  Laterality: N/A;   CARDIAC CATHETERIZATION  1990s   [facility]; "[name]"   CARDIOVERSION N/A 05/27/2016   Procedure: CARDIOVERSION;  Surgeon: [name], MD;  Location: [facility];  Service: Cardiovascular;  Laterality: N/A;   CARDIOVERSION N/A 11/25/2016   Procedure: CARDIOVERSION;  Surgeon: [name], MD;  Location: [facility];  Service: Cardiovascular;  Laterality: N/A;   CARDIOVERSION N/A 09/18/2018   Procedure: CARDIOVERSION;  Surgeon: [name], MD;  Location: [facility];  Service: Cardiovascular;  Laterality: N/A;   CARDIOVERSION N/A 07/28/2019   Procedure: CARDIOVERSION;  Surgeon: [name], MD;  Location: [facility];  Service: Cardiovascular;  Laterality: N/A;   CARDIOVERSION N/A 09/07/2019   Procedure: CARDIOVERSION;  Surgeon: [name], MD;  Location: [facility];  Service: Cardiovascular;  Laterality: N/A;   CARDIOVERSION N/A 02/19/2022   Procedure: CARDIOVERSION;  Surgeon: [name], MD;  Location: [facility];  Service: Cardiovascular;  Laterality: N/A;   CATARACT EXTRACTION W/ INTRAOCULAR LENS  IMPLANT, BILATERAL Bilateral    COLONOSCOPY     ELECTROPHYSIOLOGIC STUDY N/A 01/24/2016   Procedure: CARDIOVERSION;  Surgeon: [name], MD;  Location: [facility];  Service: Cardiovascular;  Laterality:  N/A;   implantable loop recorder implantation  10/18/2019   [employer] Reveal [place] model ZOX09 [phone] G) implantable loop recorder    LAPAROSCOPIC CHOLECYSTECTOMY     never stimulator     for her bladder   REDUCTION MAMMAPLASTY Bilateral    SHOULDER SURGERY Left 10/03/2022   TONSILLECTOMY     TOTAL HIP ARTHROPLASTY Right 05/12/2018   Procedure: TOTAL HIP ARTHROPLASTY ANTERIOR APPROACH;  Surgeon: Durene Romans, MD;  Location: [facility];  Service: Orthopedics;  Laterality: Right;  70 mins    OB History     Gravida  2   Para  0   Term  0   Preterm  0   AB  0   Living         SAB  0   IAB  0   Ectopic  0   Multiple      Live Births               Home Medications    Prior to Admission medications   Medication Sig Start Date End Date Taking? Authorizing Provider  calcium carbonate (OS-CAL - DOSED IN MG OF ELEMENTAL CALCIUM) 1250 (500 Ca)  MG tablet Take 1 tablet by mouth 2 (two) times a week.    [provider]  ELIQUIS 5 MG TABS tablet TAKE ONE TABLET BY MOUTH TWICE DAILY 12/30/22   [name], MD  ferrous sulfate 325 (65 FE) MG tablet Take 1 tablet (325 mg total) by mouth daily with breakfast. 10/11/22   Iva Boop, MD  glucose blood (ONETOUCH ULTRA) test strip CHECK BLOOD SUGAR 2 TIMES A DAY AS DIRECTED DX CODE E11.9 08/26/22   Carlus Pavlov, MD  hydroxypropyl methylcellulose / hypromellose (ISOPTO TEARS / GONIOVISC) 2.5 % ophthalmic solution Place 1 drop into both eyes 2 (two) times daily as needed for dry eyes.    [provider]  hyoscyamine (LEVBID) 0.375 MG 12 hr tablet Take 1 tablet (0.375 mg total) by mouth 2 (two) times daily. 01/07/23   Iva Boop, MD  Insulin Glargine [facility]) 100 UNIT/ML Inject 8-12 Units into the skin at bedtime. 08/29/22   Carlus Pavlov, MD  Insulin Pen Needle 32G X 4 MM MISC Use 1x a day 08/26/22   Carlus Pavlov, MD  magnesium oxide (MAG-OX) 400 MG tablet Take 1 tablet (400 mg total)  by mouth 2 (two) times daily. 05/11/21   Graciella Freer, PA-C  metFORMIN (GLUCOPHAGE-XR) 500 MG 24 hr tablet TAKE TWO TABLETS BY MOUTH TWICE DAILY WITH MEALS 08/26/22   Carlus Pavlov, MD  OZEMPIC, 0.25 OR 0.5 MG/DOSE, 2 MG/3ML SOPN Inject 0.25 mg into the skin once a week. 03/13/23   [provider]  pantoprazole (PROTONIX) 40 MG tablet Take 40 mg by mouth 2 (two) times daily.    [provider]  rosuvastatin (CRESTOR) 5 MG tablet Take 5 mg by mouth daily.    [provider]  sertraline (ZOLOFT) 100 MG tablet TAKE 1 TABLET BY MOUTH NIGHTLY 04/21/23   Bedsole, Amy E, MD  traMADol (ULTRAM) 50 MG tablet Take 50 mg by mouth as needed. 08/05/22   [provider]  zolpidem (AMBIEN) 10 MG tablet Take 5 mg by mouth at bedtime. 02/05/22   [provider]    Family History Family History  Problem Relation Age of Onset   Cancer Mother        fallopian tube   Dementia Mother    Pulmonary embolism Mother    Hyperlipidemia Mother    Diabetes Mother    Hyperlipidemia Father    Lung cancer Father    Hypertension Sister    Diabetes Brother    Cancer Brother    Stroke Paternal Grandmother    Stroke Paternal Grandfather    Kidney cancer Son    Dementia Maternal Aunt    Colon cancer Neg Hx    Esophageal cancer Neg Hx     Social History Social History   Tobacco Use   Smoking status: Never   Smokeless tobacco: Never   Tobacco comments:    Never smoke 08/30/21  Vaping Use   Vaping status: Never Used  Substance Use Topics   Alcohol use: Never   Drug use: No     Allergies   Pravachol [pravastatin sodium] and Cortisone   Review of Systems Review of Systems  Constitutional:  Negative for chills and fever.  Gastrointestinal:  Positive for abdominal pain. Negative for blood in stool, constipation, diarrhea, nausea and vomiting.  Genitourinary:  Positive for frequency and urgency. Negative for dysuria, flank pain and hematuria.      Physical Exam Triage Vital Signs ED Triage Vitals  Encounter  Vitals Group     BP 05/15/23 1200 132/74     Systolic BP Percentile --      Diastolic BP Percentile --      Pulse Rate 05/15/23 1200 89     Resp 05/15/23 1200 18     Temp 05/15/23 1200 98 F (36.7 C)     Temp src --      SpO2 05/15/23 1200 97 %     Weight --      Height --      Head Circumference --      Peak Flow --      Pain Score 05/15/23 1159 2     Pain Loc --      Pain Education --      Exclude from Growth Chart --    No data found.  Updated Vital Signs BP 132/74   Pulse 89   Temp 98 F (36.7 C)   Resp 18   LMP  (LMP Unknown)   SpO2 97%   Visual Acuity Right Eye Distance:   Left Eye Distance:   Bilateral Distance:    Right Eye Near:   Left Eye Near:    Bilateral Near:     Physical Exam Constitutional:      General: She is not in acute distress. HENT:     Mouth/Throat:     Mouth: Mucous membranes are moist.  Cardiovascular:     Rate and Rhythm: Normal rate and regular rhythm.     Heart sounds: Normal heart sounds.  Pulmonary:     Effort: Pulmonary effort is normal. No respiratory distress.     Breath sounds: Normal breath sounds.  Abdominal:     General: Bowel sounds are normal.     Palpations: Abdomen is soft.     Tenderness: There is no abdominal tenderness. There is no right CVA tenderness, left CVA tenderness, guarding or rebound.  Neurological:     Mental Status: She is alert.      UC Treatments / Results  Labs (all labs ordered are listed, but only abnormal results are displayed) Labs Reviewed  POCT URINALYSIS DIP (MANUAL ENTRY) - Abnormal; Notable for the following components:      Result Value   Blood, UA trace-intact (*)    All other components within normal limits    EKG   Radiology No results found.  Procedures Procedures (including critical care time)  Medications Ordered in UC Medications - No data to display  Initial Impression / Assessment and  Plan / UC Course  I have reviewed the triage vital signs and the nursing notes.  Pertinent labs & imaging results that were available during my care of the patient were reviewed by me and considered in my medical decision making (see chart for details).    Urinary frequency, lower abdominal pain.  Afebrile and vital signs are stable.  Abdomen is soft and nontender with good bowel sounds.  No CVAT.  Urine does not show signs of infection.  It does have trace blood.  Discussed this finding with the patient and instructed her to have her urine rechecked by her PCP next week.  ED precautions given.  Education provided on urinary frequency and abdominal pain.  Patient agrees to plan of care.  Final Clinical Impressions(s) / UC Diagnoses   Final diagnoses:  Urinary frequency  Lower abdominal pain     Discharge Instructions      Your urine does not show signs of infection today.  It  does have trace blood and will need to be rechecked by your primary care provider next week.    Go to the emergency department if you have worsening symptoms.          ED Prescriptions   None    PDMP not reviewed this encounter.   Mickie Bail, NP 05/15/23 1227

## 2023-05-18 DIAGNOSIS — I10 Essential (primary) hypertension: Secondary | ICD-10-CM | POA: Diagnosis not present

## 2023-05-18 DIAGNOSIS — Z8679 Personal history of other diseases of the circulatory system: Secondary | ICD-10-CM | POA: Diagnosis not present

## 2023-05-18 DIAGNOSIS — I341 Nonrheumatic mitral (valve) prolapse: Secondary | ICD-10-CM | POA: Diagnosis not present

## 2023-05-19 ENCOUNTER — Encounter: Payer: Self-pay | Admitting: Internal Medicine

## 2023-05-20 NOTE — Patient Instructions (Signed)
 DUE TO COVID-19 ONLY TWO VISITORS  (aged 76 and older)  ARE ALLOWED TO COME WITH YOU AND STAY IN THE WAITING ROOM ONLY DURING PRE OP AND PROCEDURE.   **NO VISITORS ARE ALLOWED IN THE SHORT STAY AREA OR RECOVERY ROOM!!**  IF YOU WILL BE ADMITTED INTO THE HOSPITAL YOU ARE ALLOWED ONLY FOUR SUPPORT PEOPLE DURING VISITATION HOURS ONLY (7 AM -8PM)   The support person(s) must pass our screening, gel in and out, and wear a mask at all times, including in the patient's room. Patients must also wear a mask when staff or their support person are in the room. Visitors GUEST BADGE MUST BE WORN VISIBLY  One adult visitor may remain with you overnight and MUST be in the room by 8 P.M.     Your procedure is scheduled on: 05/22/23   Report to [facility] Main Entrance    Report to admitting at : 7:30 AM   Call this number if you have problems the morning of surgery [phone]   Do not eat food :After Midnight.   After Midnight you may have the following liquids until : 7:00 AM DAY OF SURGERY  Water Black Coffee (sugar ok, NO MILK/CREAM OR CREAMERS)  Tea (sugar ok, NO MILK/CREAM OR CREAMERS) regular and decaf                             Plain Jell-O (NO RED)                                           Fruit ices (not with fruit pulp, NO RED)                                     Popsicles (NO RED)                                                                  Juice: apple, WHITE grape, WHITE cranberry Sports drinks like Gatorade (NO RED)   The day of surgery:  Drink ONE (1) Pre-Surgery Clear G2 at : 7:00 AM the morning of surgery. Drink in one sitting. Do not sip.  This drink was given to you during your hospital  pre-op appointment visit. Nothing else to drink after completing the  Pre-Surgery Clear Ensure or G2.          If you have questions, please contact your surgeon's office.  FOLLOW  ANY ADDITIONAL PRE OP INSTRUCTIONS YOU RECEIVED FROM YOUR SURGEON'S OFFICE!!!   Oral Hygiene  is also important to reduce your risk of infection.                                    Remember - BRUSH YOUR TEETH THE MORNING OF SURGERY WITH YOUR REGULAR TOOTHPASTE  DENTURES WILL BE REMOVED PRIOR TO SURGERY PLEASE DO NOT APPLY "Poly grip" OR ADHESIVES!!!   Do NOT smoke after Midnight   Take these medicines the morning of  surgery with A SIP OF WATER: dronedarone,pantoprazole.  DO NOT TAKE ANY ORAL DIABETIC MEDICATIONS ( metformin) DAY OF YOUR SURGERY  HOLD ozempic 7 days before surgery: after: 05/14/23  These are anesthesia recommendations for holding your anticoagulants.  Please contact your prescribing physician to confirm IF it is safe to hold your anticoagulants for this length of time.   Eliquis Apixaban   72 hours   Xarelto Rivaroxaban   72 hours  Plavix Clopidogrel   120 hours  Pletal Cilostazol   120 hours   HOLD Eliquis after: 05/18/23  Bring CPAP mask and tubing day of surgery.                              You may not have any metal on your body including hair pins, jewelry, and body piercing             Do not wear make-up, lotions, powders, perfumes/cologne, or deodorant  Do not wear nail polish including gel and S&S, artificial/acrylic nails, or any other type of covering on natural nails including finger and toenails. If you have artificial nails, gel coating, etc. that needs to be removed by a nail salon please have this removed prior to surgery or surgery may need to be canceled/ delayed if the surgeon/ anesthesia feels like they are unable to be safely monitored.   Do not shave  48 hours prior to surgery.    Do not bring valuables to the hospital. [facility] IS NOT             RESPONSIBLE   FOR VALUABLES.   Contacts, glasses, or bridgework may not be worn into surgery.   Bring small overnight bag day of surgery.   DO NOT BRING YOUR HOME MEDICATIONS TO THE HOSPITAL. PHARMACY WILL DISPENSE MEDICATIONS LISTED ON YOUR MEDICATION LIST TO YOU DURING YOUR ADMISSION  IN THE HOSPITAL!    Patients discharged on the day of surgery will not be allowed to drive home.  Someone NEEDS to stay with you for the first 24 hours after anesthesia.   Special Instructions: Bring a copy of your healthcare power of attorney and living will documents         the day of surgery if you haven't scanned them before.              Please read over the following fact sheets you were given: IF YOU HAVE QUESTIONS ABOUT YOUR PRE-OP INSTRUCTIONS PLEASE CALL [phone]     [facility] - Preparing for Surgery Before surgery, you can play an important role.  Because skin is not sterile, your skin needs to be as free of germs as possible.  You can reduce the number of germs on your skin by washing with CHG (chlorahexidine gluconate) soap before surgery.  CHG is an antiseptic cleaner which kills germs and bonds with the skin to continue killing germs even after washing. Please DO NOT use if you have an allergy to CHG or antibacterial soaps.  If your skin becomes reddened/irritated stop using the CHG and inform your nurse when you arrive at Short Stay. Do not shave (including legs and underarms) for at least 48 hours prior to the first CHG shower.  You may shave your face/neck. Please follow these instructions carefully:  1.  Shower with CHG Soap the night before surgery and the  morning of Surgery.  2.  If you choose to wash your hair, wash  your hair first as usual with your  normal  shampoo.  3.  After you shampoo, rinse your hair and body thoroughly to remove the  shampoo.                           4.  Use CHG as you would any other liquid soap.  You can apply chg directly  to the skin and wash                       Gently with a scrungie or clean washcloth.  5.  Apply the CHG Soap to your body ONLY FROM THE NECK DOWN.   Do not use on face/ open                           Wound or open sores. Avoid contact with eyes, ears mouth and genitals (private parts).                       Wash face,   Genitals (private parts) with your normal soap.             6.  Wash thoroughly, paying special attention to the area where your surgery  will be performed.  7.  Thoroughly rinse your body with warm water from the neck down.  8.  DO NOT shower/wash with your normal soap after using and rinsing off  the CHG Soap.                9.  Pat yourself dry with a clean towel.            10.  Wear clean pajamas.            11.  Place clean sheets on your bed the night of your first shower and do not  sleep with pets. Day of Surgery : Do not apply any lotions/deodorants the morning of surgery.  Please wear clean clothes to the hospital/surgery center.  FAILURE TO FOLLOW THESE INSTRUCTIONS MAY RESULT IN THE CANCELLATION OF YOUR SURGERY PATIENT SIGNATURE_________________________________  NURSE SIGNATURE__________________________________  ________________________________________________________________________

## 2023-05-21 ENCOUNTER — Encounter (HOSPITAL_COMMUNITY)
Admission: RE | Admit: 2023-05-21 | Discharge: 2023-05-21 | Disposition: A | Discharge status: Home / Self Care | Source: Ambulatory Visit | Attending: Orthopedic Surgery | Admitting: Orthopedic Surgery

## 2023-05-21 ENCOUNTER — Other Ambulatory Visit: Payer: Self-pay

## 2023-05-21 ENCOUNTER — Encounter (HOSPITAL_COMMUNITY): Payer: Self-pay

## 2023-05-21 ENCOUNTER — Ambulatory Visit (INDEPENDENT_AMBULATORY_CARE_PROVIDER_SITE_OTHER): Admitting: Primary Care

## 2023-05-21 ENCOUNTER — Encounter: Payer: Self-pay | Admitting: Primary Care

## 2023-05-21 VITALS — BP 165/73 | HR 65 | Temp 98.4°F | Ht 65.0 in | Wt 170.0 lb

## 2023-05-21 VITALS — BP 128/72 | HR 70 | Temp 97.0°F | Ht 65.0 in | Wt 171.0 lb

## 2023-05-21 DIAGNOSIS — I509 Heart failure, unspecified: Secondary | ICD-10-CM | POA: Insufficient documentation

## 2023-05-21 DIAGNOSIS — E1159 Type 2 diabetes mellitus with other circulatory complications: Secondary | ICD-10-CM | POA: Insufficient documentation

## 2023-05-21 DIAGNOSIS — R350 Frequency of micturition: Secondary | ICD-10-CM

## 2023-05-21 DIAGNOSIS — Z01812 Encounter for preprocedural laboratory examination: Secondary | ICD-10-CM | POA: Diagnosis present

## 2023-05-21 DIAGNOSIS — I4891 Unspecified atrial fibrillation: Secondary | ICD-10-CM | POA: Insufficient documentation

## 2023-05-21 DIAGNOSIS — E119 Type 2 diabetes mellitus without complications: Secondary | ICD-10-CM

## 2023-05-21 DIAGNOSIS — Z01818 Encounter for other preprocedural examination: Secondary | ICD-10-CM

## 2023-05-21 DIAGNOSIS — G4733 Obstructive sleep apnea (adult) (pediatric): Secondary | ICD-10-CM | POA: Insufficient documentation

## 2023-05-21 DIAGNOSIS — R3129 Other microscopic hematuria: Secondary | ICD-10-CM

## 2023-05-21 DIAGNOSIS — I110 Hypertensive heart disease with heart failure: Secondary | ICD-10-CM | POA: Insufficient documentation

## 2023-05-21 DIAGNOSIS — I152 Hypertension secondary to endocrine disorders: Secondary | ICD-10-CM | POA: Insufficient documentation

## 2023-05-21 DIAGNOSIS — M19011 Primary osteoarthritis, right shoulder: Secondary | ICD-10-CM | POA: Diagnosis not present

## 2023-05-21 HISTORY — DX: Cardiac arrhythmia, unspecified: I49.9

## 2023-05-21 HISTORY — DX: Unspecified atrial fibrillation: I48.91

## 2023-05-21 HISTORY — DX: Pneumonia, unspecified organism: J18.9

## 2023-05-21 HISTORY — DX: Unspecified osteoarthritis, unspecified site: M19.90

## 2023-05-21 HISTORY — DX: Unspecified asthma, uncomplicated: J45.909

## 2023-05-21 LAB — BASIC METABOLIC PANEL
Anion gap: 8 (ref 5–15)
BUN: 19 mg/dL (ref 8–23)
CO2: 24 mmol/L (ref 22–32)
Calcium: 9.1 mg/dL (ref 8.9–10.3)
Chloride: 99 mmol/L (ref 98–111)
Creatinine, Ser: 1.02 mg/dL — ABNORMAL HIGH (ref 0.44–1.00)
GFR, Estimated: 57 mL/min — ABNORMAL LOW (ref >60)
Glucose, Bld: 150 mg/dL — ABNORMAL HIGH (ref 70–99)
Potassium: 4.4 mmol/L (ref 3.5–5.1)
Sodium: 131 mmol/L — ABNORMAL LOW (ref 135–145)

## 2023-05-21 LAB — CBC
HCT: 33.4 % — ABNORMAL LOW (ref 36.0–46.0)
Hemoglobin: 10.8 g/dL — ABNORMAL LOW (ref 12.0–15.0)
MCH: 29.8 pg (ref 26.0–34.0)
MCHC: 32.3 g/dL (ref 30.0–36.0)
MCV: 92.3 fL (ref 80.0–100.0)
Platelets: 297 10*3/uL (ref 150–400)
RBC: 3.62 MIL/uL — ABNORMAL LOW (ref 3.87–5.11)
RDW: 13.2 % (ref 11.5–15.5)
WBC: 6.6 10*3/uL (ref 4.0–10.5)
nRBC: 0 % (ref 0.0–0.2)

## 2023-05-21 LAB — POC URINALSYSI DIPSTICK (AUTOMATED)
Bilirubin, UA: NEGATIVE
Blood, UA: POSITIVE
Glucose, UA: NEGATIVE
Ketones, UA: NEGATIVE
Leukocytes, UA: NEGATIVE
Nitrite, UA: NEGATIVE
Protein, UA: POSITIVE — AB
Spec Grav, UA: 1.015 (ref 1.010–1.025)
Urobilinogen, UA: 0.2 U/dL
pH, UA: 5 (ref 5.0–8.0)

## 2023-05-21 LAB — SURGICAL PCR SCREEN
MRSA, PCR: NEGATIVE
Staphylococcus aureus: NEGATIVE

## 2023-05-21 LAB — URINALYSIS, MICROSCOPIC ONLY: RBC / HPF: NONE SEEN (ref 0–?)

## 2023-05-21 LAB — GLUCOSE, CAPILLARY: Glucose-Capillary: 160 mg/dL — ABNORMAL HIGH (ref 70–99)

## 2023-05-21 NOTE — Progress Notes (Addendum)
 For Anesthesia: PCP - [name], MD  Cardiologist - [name], MD  Clearance: Robin Searing: NP: 05/08/23 Bowel Prep reminder:  Chest x-ray - 10/05/22 EKG - 02/12/23 Stress Test -  ECHO -  Cardiac Cath - 1990's Pacemaker/ICD device last checked: Pacemaker orders received: Device Rep notified: Loop Recorder: YES Spinal Cord Stimulator: N/A Bladder Stimulator. : Yes Sleep Study - Yes CPAP - Yes  Fasting Blood Sugar - 100's - 140's Checks Blood Sugar __1___ times a day Date and result of last Hgb A1c-7.4: 03/13/23  Last dose of GLP1 agonist- Ozempic: last dose: 05/10/23 GLP1 instructions:   Last dose of SGLT-2 inhibitors- N/A SGLT-2 instructions:   Blood Thinner Instructions: Eliquis. On Hold since: 05/18/23 Aspirin Instructions: Last Dose:  Activity level: Can go up a flight of stairs and activities of daily living without stopping and without chest pain and/or shortness of breath   Able to exercise without chest pain and/or shortness of breath  Anesthesia review: Hx: HTN,DIA,Loop recorder,Mitral valve regurgitation,Afib,OSA(CPAP),Frequents UTI's (last visit to ED: 05/15/23.),follow up visit on: 05/21/23.  Patient denies shortness of breath, fever, cough and chest pain at PAT appointment   Patient verbalized understanding of instructions that were given to them at the PAT appointment. Patient was also instructed that they will need to review over the PAT instructions again at home before surgery.

## 2023-05-21 NOTE — Progress Notes (Signed)
 Subjective:    Patient ID: [name], female    DOB: Jun 20, 1945, 78 y.o.   MRN: [id number]  Urinary Frequency  Associated symptoms include frequency. Pertinent negatives include no hematuria or nausea.    [name] is a very pleasant 78 y.o. female patient of Dr. Ermalene Searing with a history chronic microscopic hemtauria, type 2 diabetes, atrial fibrillation on Eliquis, urinary incontinence, acute cystitis who presents today for urgent care follow up.  She presented to urgent care on 05/15/23 for increased urinary frequency x 1 day with lower abdominal discomfort and urinary urgency. UA was negative except for trace blood. She was advised to follow up with PCP.  Today her symptoms of urinary frequency and urgency have improved. She is scheduled for surgery tomorrow, right shoulder replacement, is wanting to make sure she is stable.   She's never been told she has microscopic hematuria. She denies gross hematuria. She underwent CT abdomen/pelvis in April 2024 which was without significant bladder wall thickening or inflammation. She is a non smoker. She does have a history of iron deficiency anemia, last hemaglobin was 10.3 in October 2024. She is pending labs today.   Review of Systems  Gastrointestinal:  Negative for abdominal pain and nausea.  Genitourinary:  Positive for frequency. Negative for dysuria, hematuria, pelvic pain and vaginal bleeding.         Past Medical History:  Diagnosis Date   Anemia    Anxiety    Chest pain    a. 09/2013 MV: Low risk; b. 12/2016 Coronary CTA/morphology: Ca2+ = 0; c. 11/2021 MV: EF 75%, no isch/infarct. Ao atherosclerosis noted. No signif Cor Ca2+.   Colon polyps    Constipation    Depressive disorder, not elsewhere classified    Diabetes mellitus without complication (HCC)    Diaphragmatic hernia without mention of obstruction or gangrene    Diastolic dysfunction    a. 01/2022 Echo: EF 55-60%, no rwma, GrII DD, nl RV size/fxn, RVSP  33.67mmHg. Sev dil LA, mild MR.   Disorders of bursae and tendons in shoulder region, unspecified    Diverticulosis of colon (without mention of hemorrhage)    External hemorrhoid    Fundic gland polyps of stomach, benign    GERD (gastroesophageal reflux disease)    History of hiatal hernia    HTN (hypertension)    Hypertension    Hypothyroidism    Internal hemorrhoids without mention of complication    Mitral regurgitation    a. 01/2022 Echo: Mild MR.   Obesity, unspecified    OSA on CPAP    compliant with CPAP   Other urinary incontinence    Persistent atrial fibrillation (HCC)    a. Dx 09/2013 s/p multiple DCCVS; b. 12/2016 s/p PVI; c. 2023 Flecainide changed to Tikosyn; d. 04/2022 s/p redo PVI/fib/flutter ablation.   Pure hypercholesterolemia    Tubular adenoma of colon    Type II diabetes mellitus (HCC)    Urinary frequency    Urinary urgency    Vitamin B 12 deficiency     Social History   Socioeconomic History   Marital status: Married    Spouse name: Jimmy   Number of children: 2   Years of education: Not on file   Highest education level: Not on file  Occupational History   Occupation: retired  Tobacco Use   Smoking status: Never   Smokeless tobacco: Never   Tobacco comments:    Never smoke 08/30/21  Vaping Use   Vaping status:  Never Used  Substance and Sexual Activity   Alcohol use: Never   Drug use: No   Sexual activity: Yes  Other Topics Concern   Not on file  Social History Narrative   ** Merged History Encounter **       Exercise walks 3 times daily Lives with spouse in [place] - retired from employment 1 son and 1 daughter  Diet fruit, occ fast food, salads and lean meat  3 tea/day, no EtOH, no tobacco, no drugs    Social Drivers of Corporate investment banker Strain: Low Risk  (03/13/2023)   Received from [facility]   Overall Financial Resource Strain (CARDIA)    Difficulty of Paying Living Expenses: Not  hard at all  Food Insecurity: No Food Insecurity (03/13/2023)   Received from [facility]   Hunger Vital Sign    Worried About Running Out of Food in the Last Year: Never true    Ran Out of Food in the Last Year: Never true  Transportation Needs: No Transportation Needs (03/13/2023)   Received from [facility] - Transportation    In the past 12 months, has lack of transportation kept you from medical appointments or from getting medications?: No    Lack of Transportation (Non-Medical): No  Physical Activity: Inactive (10/29/2022)   Exercise Vital Sign    Days of Exercise per Week: 0 days    Minutes of Exercise per Session: 0 min  Stress: No Stress Concern Present (10/29/2022)   [employer] of Occupational Health - Occupational Stress Questionnaire    Feeling of Stress : Not at all  Social Connections: Moderately Integrated (10/29/2022)   Social Connection and Isolation Panel [NHANES]    Frequency of Communication with Friends and Family: More than three times a week    Frequency of Social Gatherings with Friends and Family: More than three times a week    Attends Religious Services: More than 4 times per year    Active Member of [employer] or Organizations: No    Attends Banker Meetings: Never    Marital Status: Married  Catering manager Violence: Not At Risk (10/29/2022)   Humiliation, Afraid, Rape, and Kick questionnaire    Fear of Current or Ex-Partner: No    Emotionally Abused: No    Physically Abused: No    Sexually Abused: No    Past Surgical History:  Procedure Laterality Date   ABDOMINAL HYSTERECTOMY  1972   Have both ovaries   ATRIAL FIBRILLATION ABLATION  12/17/2016   ATRIAL FIBRILLATION ABLATION N/A 12/17/2016   Procedure: ATRIAL FIBRILLATION ABLATION;  Surgeon: [name], MD;  [facility];  Service: Cardiovascular;  Laterality: N/A;   ATRIAL FIBRILLATION ABLATION N/A 04/23/2022   Procedure:  ATRIAL FIBRILLATION ABLATION;  Surgeon: [name], MD;  [facility]: [facility];  Service: Cardiovascular;  Laterality: N/A;   CARDIAC CATHETERIZATION  1990s   [facility]; "[name]"   CARDIOVERSION N/A 05/27/2016   Procedure: CARDIOVERSION;  Surgeon: [name], MD;  Location: [facility];  Service: Cardiovascular;  Laterality: N/A;   CARDIOVERSION N/A 11/25/2016   Procedure: CARDIOVERSION;  Surgeon: [name], MD;  Location: [facility];  Service: Cardiovascular;  Laterality: N/A;   CARDIOVERSION N/A 09/18/2018   Procedure: CARDIOVERSION;  Surgeon: [name], MD;  Location: [facility];  Service: Cardiovascular;  Laterality: N/A;   CARDIOVERSION N/A 07/28/2019   Procedure: CARDIOVERSION;  Surgeon: [name], MD;  Location: [facility];  Service: Cardiovascular;  Laterality: N/A;   CARDIOVERSION N/A 09/07/2019   Procedure: CARDIOVERSION;  Surgeon: [name], MD;  Location: [facility];  Service: Cardiovascular;  Laterality: N/A;   CARDIOVERSION N/A 02/19/2022   Procedure: CARDIOVERSION;  Surgeon: [name], MD;  Location: [facility];  Service: Cardiovascular;  Laterality: N/A;   CATARACT EXTRACTION W/ INTRAOCULAR LENS  IMPLANT, BILATERAL Bilateral    COLONOSCOPY     ELECTROPHYSIOLOGIC STUDY N/A 01/24/2016   Procedure: CARDIOVERSION;  Surgeon: [name], MD;  Location: [facility];  Service: Cardiovascular;  Laterality: N/A;   implantable loop recorder implantation  10/18/2019   Medtronic Reveal [place] model [id number]) implantable loop recorder    LAPAROSCOPIC CHOLECYSTECTOMY     never stimulator     for her bladder   REDUCTION MAMMAPLASTY Bilateral    SHOULDER SURGERY Left 10/03/2022   TONSILLECTOMY     TOTAL HIP ARTHROPLASTY Right 05/12/2018   Procedure: TOTAL HIP ARTHROPLASTY ANTERIOR APPROACH;  Surgeon: Durene Romans, MD;  Location: [facility];  Service: Orthopedics;  Laterality: Right;  70 mins    Family History  Problem Relation Age of  Onset   Cancer Mother        fallopian tube   Dementia Mother    Pulmonary embolism Mother    Hyperlipidemia Mother    Diabetes Mother    Hyperlipidemia Father    Lung cancer Father    Hypertension Sister    Diabetes Brother    Cancer Brother    Stroke Paternal Grandmother    Stroke Paternal Grandfather    Kidney cancer Son    Dementia Maternal Aunt    Colon cancer Neg Hx    Esophageal cancer Neg Hx     Allergies  Allergen Reactions   Pravachol [Pravastatin Sodium] Other (See Comments)    Leg pain    Cortisone Rash    Cortisone injection NOT PREDNISONE >40 years ago.. caused rash in setting of treating poison ivy? If true reaction. Has tolerated prednisolone in past in form of eye drops    Current Outpatient Medications on File Prior to Visit  Medication Sig Dispense Refill   calcium carbonate (OS-CAL - DOSED IN MG OF ELEMENTAL CALCIUM) 1250 (500 Ca) MG tablet Take 1 tablet by mouth 2 (two) times a week.     dronedarone (MULTAQ) 400 MG tablet Take 400 mg by mouth 2 (two) times daily with a meal.     ELIQUIS 5 MG TABS tablet TAKE ONE TABLET BY MOUTH TWICE DAILY 60 tablet 5   ferrous sulfate 325 (65 FE) MG tablet Take 1 tablet (325 mg total) by mouth daily with breakfast.     glucose blood (ONETOUCH ULTRA) test strip CHECK BLOOD SUGAR 2 TIMES A DAY AS DIRECTED DX CODE E11.9 100 strip 3   hydroxypropyl methylcellulose / hypromellose (ISOPTO TEARS / GONIOVISC) 2.5 % ophthalmic solution Place 1 drop into both eyes 2 (two) times daily as needed for dry eyes.     hyoscyamine (LEVBID) 0.375 MG 12 hr tablet Take 1 tablet (0.375 mg total) by mouth 2 (two) times daily. 60 tablet 1   Insulin Pen Needle 32G X 4 MM MISC Use 1x a day 100 each 3   magnesium oxide (MAG-OX) 400 MG tablet Take 1 tablet (400 mg total) by mouth 2 (two) times daily. (Patient taking differently: Take 400 mg by mouth daily.) 60 tablet 6   metFORMIN (GLUCOPHAGE-XR) 500 MG 24 hr tablet TAKE  TWO TABLETS BY MOUTH TWICE  DAILY WITH MEALS 360 tablet 3   OZEMPIC, 0.25 OR 0.5 MG/DOSE, 2 MG/3ML SOPN Inject 0.25 mg into the skin once a week.     pantoprazole (PROTONIX) 40 MG tablet Take 40 mg by mouth 2 (two) times daily.     rosuvastatin (CRESTOR) 5 MG tablet Take 5 mg by mouth daily.     sertraline (ZOLOFT) 100 MG tablet TAKE 1 TABLET BY MOUTH NIGHTLY 30 tablet 0   Insulin Glargine (BASAGLAR KWIKPEN) 100 UNIT/ML Inject 8-12 Units into the skin at bedtime. (Patient not taking: Reported on 05/21/2023) 15 mL 1   No current facility-administered medications on file prior to visit.    BP 128/72   Pulse 70   Temp (!) 97 F (36.1 C) (Temporal)   Ht 5\' 5"  (1.651 m)   Wt 171 lb (77.6 kg)   LMP  (LMP Unknown)   SpO2 98%   BMI 28.46 kg/m  Objective:   Physical Exam Cardiovascular:     Rate and Rhythm: Normal rate and regular rhythm.  Pulmonary:     Effort: Pulmonary effort is normal.     Breath sounds: Normal breath sounds.  Musculoskeletal:     Cervical back: Neck supple.  Skin:    General: Skin is warm and dry.  Neurological:     Mental Status: She is alert and oriented to person, place, and time.  Psychiatric:        Mood and Affect: Mood normal.           Assessment & Plan:  Urinary frequency Assessment & Plan: Improving to resolved.  Reviewed UC notes and labs.  POCT UA negative for leukocytes, nitrites; positive for trace blood.   Urine microscope ordered, results pending.   Follow up as needed for symptoms.   I evaluated patient, was consulted regarding treatment, and agree with assessment and plan per Julaine Fusi, MSN, FNP student.   Mayra Reel, NP-C   Orders: -     POCT Urinalysis Dipstick (Automated)  Microscopic hematuria Assessment & Plan: POCT UA today negative for leukocytes, nitrites; positive for trace blood.   UA trends suggest chronic microscopic hematuria. Do not see prior work up. Will add on urine microscope for further eval.  Consider hematuria work up. This  could be from chronic Eliquis use .Will consult with PCP who knows patient history better. Reviewed CT abdomen/pelvis from April 2024.  CBC trends with low hemoglobin. Will be repeated today at pre-op appointment. Results to be reviewed when available.   Remain on Eliquis for afib.  I evaluated patient, was consulted regarding treatment, and agree with assessment and plan per Julaine Fusi, MSN, FNP student.   Mayra Reel, NP-C    Orders: -     Urine Microscopic        Doreene Nest, NP

## 2023-05-21 NOTE — Assessment & Plan Note (Addendum)
 POCT UA today negative for leukocytes, nitrites; positive for trace blood.   UA trends suggest chronic microscopic hematuria. Do not see prior work up. Will add on urine microscope for further eval.  Consider hematuria work up. This could be from chronic Eliquis use .Will consult with PCP who knows patient history better. Reviewed CT abdomen/pelvis from April 2024.  CBC trends with low hemoglobin. Will be repeated today at pre-op appointment. Results to be reviewed when available.   Remain on Eliquis for afib.  I evaluated patient, was consulted regarding treatment, and agree with assessment and plan per Julaine Fusi, MSN, FNP student.   Mayra Reel, NP-C

## 2023-05-21 NOTE — Progress Notes (Signed)
 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 never smoker with h/o HTN, OSA on CPAP, atrial fibrillation, DM II (A1C 7.4), CHF, right shoulder OA scheduled for above procedure 05/22/2023 with Dr. Francena King.   Per cardiology preoperative evaluation 05/08/2023, "-Patient's RCRI score is 6.6%   The patient affirms she has been doing well without any new cardiac symptoms. They are able to achieve 6 METS without cardiac limitations. Therefore, based on ACC/AHA guidelines, the patient would be at acceptable risk for the planned procedure without further cardiovascular testing. The patient was advised that if she develops new symptoms prior to surgery to contact our office to arrange for a follow-up visit, and she verbalized understanding.    The patient was advised that if she develops new symptoms prior to surgery to contact our office to arrange for a follow-up visit, and she verbalized understanding.   Patient can hold Eliquis 3 days prior to procedure"  Pt reports last dose of Eliquis 05/18/2023.   VS: BP (!) 165/73   Pulse 65   Temp 36.9 C (Oral)   Ht 5\' 5"  (1.651 m)   Wt 77.1 kg   LMP  (LMP Unknown)   SpO2 100%   BMI 28.29 kg/m   PROVIDERS: Emily Seltzer, MD is PCP   Cardiologist - Emily Ouch, MD  LABS: Labs reviewed: Acceptable for surgery. (all labs ordered are listed, but only abnormal results are displayed)  Labs Reviewed  GLUCOSE, CAPILLARY - Abnormal; Notable for the following components:      Result Value   Glucose-Capillary 160 (*)    All other components within normal limits  SURGICAL PCR SCREEN  BASIC METABOLIC PANEL  CBC      IMAGES:   EKG:   CV: Echo 01/03/2022 1. Left ventricular ejection fraction, by estimation, is 55 to 60%. The  left ventricle has normal function. The left ventricle has no regional  wall motion abnormalities. Left ventricular diastolic parameters are  consistent with Grade II diastolic  dysfunction (pseudonormalization).   2. Right ventricular systolic function is normal. The right ventricular  size is normal. There is normal pulmonary artery systolic pressure. The  estimated right ventricular systolic pressure is 33.5 mmHg.   3. Left atrial size was severely dilated.   4. The mitral valve is normal in structure. Mild mitral valve  regurgitation. No evidence of mitral stenosis.   5. The aortic valve is tricuspid. Aortic valve regurgitation is not  visualized. No aortic stenosis is present.   6. The inferior vena cava is normal in size with greater than 50%  respiratory variability, suggesting right atrial pressure of 3 mmHg.   Myocardial Perfusion 11/16/2021 Pharmacological myocardial perfusion imaging study with no significant  ischemia GI uptake artifact noted Normal wall motion, EF estimated at 75% No EKG changes concerning for ischemia at peak stress or in recovery. CT attenuation correction images with no significant coronary calcification, minimal aortic atherosclerosis Low risk scan Past Medical History:  Diagnosis Date   A-fib (HCC)    Anemia    Anxiety    Arthritis    Asthma    Chest pain    a. 09/2013 MV: Low risk;  b. 12/2016 Coronary CTA/morphology: Ca2+ = 0; c. 11/2021 MV: EF 75%, no isch/infarct. Ao atherosclerosis noted. No signif Cor Ca2+.   Colon polyps    Constipation    Depressive disorder, not elsewhere classified    Diabetes mellitus without complication (HCC)    Diaphragmatic hernia without mention of obstruction or gangrene    Diastolic dysfunction    a. 01/2022 Echo: EF 55-60%, no rwma, GrII DD, nl RV size/fxn, RVSP 33.87mmHg. Sev dil LA, mild  MR.   Disorders of bursae and tendons in shoulder region, unspecified    Diverticulosis of colon (without mention of hemorrhage)    Dysrhythmia    External hemorrhoid    Fundic gland polyps of stomach, benign    GERD (gastroesophageal reflux disease)    History of hiatal hernia    HTN (hypertension)    Hypertension    Hypothyroidism    Internal hemorrhoids without mention of complication    Mitral regurgitation    a. 01/2022 Echo: Mild MR.   Obesity, unspecified    OSA on CPAP    compliant with CPAP   Other urinary incontinence    Persistent atrial fibrillation (HCC)    a. Dx 09/2013 s/p multiple DCCVS; b. 12/2016 s/p PVI; c. 2023 Flecainide changed to Tikosyn; d. 04/2022 s/p redo PVI/fib/flutter ablation.   Pneumonia    Pure hypercholesterolemia    Tubular adenoma of colon    Type II diabetes mellitus (HCC)    Urinary frequency    Urinary urgency    Vitamin B 12 deficiency     Past Surgical History:  Procedure Laterality Date   ABDOMINAL HYSTERECTOMY  1972   Have both ovaries   ATRIAL FIBRILLATION ABLATION  12/17/2016   ATRIAL FIBRILLATION ABLATION N/A 12/17/2016   Procedure: ATRIAL FIBRILLATION ABLATION;  Surgeon: [name], MD;  [facility];  Service: Cardiovascular;  Laterality: N/A;   ATRIAL FIBRILLATION ABLATION N/A 04/23/2022   Procedure: ATRIAL FIBRILLATION ABLATION;  Surgeon: [name], MD;  [facility];  Service: Cardiovascular;  Laterality: N/A;   CARDIAC CATHETERIZATION  1990s   [facility]; "[name]"   CARDIOVERSION N/A 05/27/2016   Procedure: CARDIOVERSION;  Surgeon: Emily Ouch, MD;  Location: [facility];  Service: Cardiovascular;  Laterality: N/A;   CARDIOVERSION N/A 11/25/2016   Procedure: CARDIOVERSION;  Surgeon: Emily Ouch, MD;  Location: [facility];  Service: Cardiovascular;  Laterality: N/A;   CARDIOVERSION N/A 09/18/2018   Procedure: CARDIOVERSION;  Surgeon: [name], MD;  Location: [facility];   Service: Cardiovascular;  Laterality: N/A;   CARDIOVERSION N/A 07/28/2019   Procedure: CARDIOVERSION;  Surgeon: [name], MD;  Location: [facility];  Service: Cardiovascular;  Laterality: N/A;   CARDIOVERSION N/A 09/07/2019   Procedure: CARDIOVERSION;  Surgeon: [name], MD;  Location: [facility];  Service: Cardiovascular;  Laterality: N/A;   CARDIOVERSION N/A 02/19/2022   Procedure: CARDIOVERSION;  Surgeon: [name], MD;  Location: [facility];  Service: Cardiovascular;  Laterality: N/A;   CATARACT EXTRACTION W/ INTRAOCULAR LENS  IMPLANT, BILATERAL Bilateral    COLONOSCOPY     ELECTROPHYSIOLOGIC STUDY N/A 01/24/2016   Procedure: CARDIOVERSION;  Surgeon: [name], MD;  Location: [facility];  Service: Cardiovascular;  Laterality: N/A;   implantable loop recorder implantation  10/18/2019   Medtronic Reveal [place] model ZOX09 ([id number]) implantable loop recorder    LAPAROSCOPIC CHOLECYSTECTOMY     never stimulator     for her bladder   REDUCTION MAMMAPLASTY Bilateral  SHOULDER SURGERY Left 10/03/2022   TONSILLECTOMY     TOTAL HIP ARTHROPLASTY Right 05/12/2018   Procedure: TOTAL HIP ARTHROPLASTY ANTERIOR APPROACH;  Surgeon: Durene Romans, MD;  Location: [facility];  Service: Orthopedics;  Laterality: Right;  70 mins    MEDICATIONS:  calcium carbonate (OS-CAL - DOSED IN MG OF ELEMENTAL CALCIUM) 1250 (500 Ca) MG tablet   dronedarone (MULTAQ) 400 MG tablet   ELIQUIS 5 MG TABS tablet   ferrous sulfate 325 (65 FE) MG tablet   glucose blood (ONETOUCH ULTRA) test strip   hydroxypropyl methylcellulose / hypromellose (ISOPTO TEARS / GONIOVISC) 2.5 % ophthalmic solution   hyoscyamine (LEVBID) 0.375 MG 12 hr tablet   Insulin Glargine (BASAGLAR KWIKPEN) 100 UNIT/ML   Insulin Pen Needle 32G X 4 MM MISC   magnesium oxide (MAG-OX) 400 MG tablet   metFORMIN (GLUCOPHAGE-XR) 500 MG 24 hr tablet   OZEMPIC, 0.25 OR 0.5 MG/DOSE, 2 MG/3ML SOPN   pantoprazole (PROTONIX) 40 MG tablet    rosuvastatin (CRESTOR) 5 MG tablet   sertraline (ZOLOFT) 100 MG tablet   No current facility-administered medications for this encounter.   Jodell Cipro Ward, PA-C [facility] [phone]

## 2023-05-21 NOTE — Progress Notes (Signed)
 Acute Office Visit  Subjective:     Patient ID: [name], female    DOB: 09-30-1945, 78 y.o.   MRN: [id number]  Chief Complaint  Patient presents with   Urinary Frequency    Urinary frequency and urgency  Symptoms began last Tuesday, was seen at [facility] 3/13 was told she had blood in urine but no Infection and she was advised to come to PCP and have urine rechecked.  Patient having shoulder replacement surgery tomorrow.    HPI Emily "[name]" is a 78 year old female with hypertension, atrial fibrillation, mitral valve regurgitation, type 2 diabetes mellitus, vitamin D deficiency, iron deficiency anemia, acute cystitis who presents today to discuss 8 day history of urinary frequency and urgency.   She is scheduled for shoulder arthroplasty tomorrow.   She presented to [facility] on 05/15/23 with 1 day symptoms of urinary frequency, urgency, lower abdominal discomfort. She was afebrile. UA did not indicate infection, however positive for trace blood. She is on Eliquis. Follow up with PCP was suggested.   She had tried AZO over-the-counter without relief.   Today she reports her symptoms of urinary frequency and urgency have improved. There has been resolution of lower abdominal discomfort. She denies hematuria, dysuria and foul-smelling urine. She reports feeling nauseous today, related to nervousness about impending surgery. No emesis, diarrhea, constipation. Denies fevers and chills. Reports likely inadequate hydration, with three 8-12oz glasses of water daily. She wanted to have a follow up before surgery, to check for infection and blood.   Reviewed CT abd/pelvis from 06/2022 - no bladder wall thickening, inflammation, other other abnormalities.   Reviewed POCT UA trends - microscopic hematuria present on prior tests. She has never been told of this.   Review of Systems  Constitutional:  Negative for chills, fever and malaise/fatigue.  Cardiovascular:  Negative for palpitations.   Gastrointestinal:  Positive for nausea. Negative for abdominal pain, constipation, diarrhea and vomiting.  Genitourinary:  Positive for frequency. Negative for dysuria, flank pain, hematuria and urgency.        Objective:    BP 128/72   Pulse 70   Temp (!) 97 F (36.1 C) (Temporal)   Ht 5\' 5"  (1.651 m)   Wt 77.6 kg   LMP  (LMP Unknown)   SpO2 98%   BMI 28.46 kg/m    Physical Exam Vitals reviewed.  Constitutional:      Appearance: Normal appearance. She is normal weight. She is not ill-appearing.  HENT:     Mouth/Throat:     Mouth: Mucous membranes are moist.  Cardiovascular:     Rate and Rhythm: Normal rate and regular rhythm.  Pulmonary:     Effort: Pulmonary effort is normal.     Breath sounds: Normal breath sounds.  Abdominal:     General: Abdomen is flat. Bowel sounds are normal.     Palpations: Abdomen is soft.  Musculoskeletal:     Cervical back: Normal range of motion and neck supple.  Neurological:     Mental Status: She is alert and oriented to person, place, and time.  Psychiatric:        Mood and Affect: Mood normal.        Behavior: Behavior normal. Behavior is cooperative.     Results for orders placed or performed in visit on 05/21/23  POCT Urinalysis Dipstick (Automated)  Result Value Ref Range   Color, UA yellow    Clarity, UA clear    Glucose, UA Negative Negative  Bilirubin, UA neg    Ketones, UA neg    Spec Grav, UA 1.015 1.010 - 1.025   Blood, UA pos    pH, UA 5.0 5.0 - 8.0   Protein, UA Positive (A) Negative   Urobilinogen, UA 0.2 0.2 or 1.0 E.U./dL   Nitrite, UA neg    Leukocytes, UA Negative Negative        Assessment & Plan:   Problem List Items Addressed This Visit       Genitourinary   Microscopic hematuria   POCT UA today negative for leukocytes, nitrites; positive for trace blood.   UA trends suggest microscopic hematuria. Will add on urine microscope for further eval.   CBC trends with low hemoglobin. Will be  repeated today at pre-op appointment. Results to be reviewed when available.   Remain on Eliquis for afib.       Relevant Orders   Urine Microscopic     Other   Urinary frequency - Primary   Improving to resolved.   POCT UA negative for leukocytes, nitrites; positive for trace blood.   Urine microscope ordered, results pending.   Follow up as needed for symptoms.       Relevant Orders   POCT Urinalysis Dipstick (Automated) (Completed)    No orders of the defined types were placed in this encounter.   Follow up as needed for symptoms.   Keep routine appointments with PCP as scheduled.   Lindell Spar, RN

## 2023-05-21 NOTE — Patient Instructions (Signed)
 An extra urine test has been added on for further evaluation.  We will be in touch with those results.   It was a pleasure meeting you today.   Hope your surgery goes well!

## 2023-05-21 NOTE — Assessment & Plan Note (Addendum)
 Improving to resolved.  Reviewed UC notes and labs.  POCT UA negative for leukocytes, nitrites; positive for trace blood.   Urine microscope ordered, results pending.   Follow up as needed for symptoms.   I evaluated patient, was consulted regarding treatment, and agree with assessment and plan per Julaine Fusi, MSN, FNP student.   Mayra Reel, NP-C

## 2023-05-22 ENCOUNTER — Other Ambulatory Visit: Payer: Self-pay

## 2023-05-22 ENCOUNTER — Ambulatory Visit (HOSPITAL_COMMUNITY): Admitting: Certified Registered"

## 2023-05-22 ENCOUNTER — Encounter (HOSPITAL_COMMUNITY): Payer: Self-pay | Admitting: Orthopedic Surgery

## 2023-05-22 ENCOUNTER — Encounter (HOSPITAL_COMMUNITY)
Admission: RE | Disposition: A | Discharge status: Home / Self Care | Payer: Self-pay | Source: Ambulatory Visit | Attending: Orthopedic Surgery

## 2023-05-22 ENCOUNTER — Ambulatory Visit (HOSPITAL_COMMUNITY)
Admission: RE | Admit: 2023-05-22 | Discharge: 2023-05-22 | Disposition: A | Discharge status: Home / Self Care | Source: Ambulatory Visit | Attending: Orthopedic Surgery | Admitting: Orthopedic Surgery

## 2023-05-22 ENCOUNTER — Ambulatory Visit (HOSPITAL_COMMUNITY): Payer: Self-pay | Admitting: Physician Assistant

## 2023-05-22 DIAGNOSIS — K219 Gastro-esophageal reflux disease without esophagitis: Secondary | ICD-10-CM | POA: Insufficient documentation

## 2023-05-22 DIAGNOSIS — I4891 Unspecified atrial fibrillation: Secondary | ICD-10-CM

## 2023-05-22 DIAGNOSIS — G4733 Obstructive sleep apnea (adult) (pediatric): Secondary | ICD-10-CM | POA: Diagnosis not present

## 2023-05-22 DIAGNOSIS — M19011 Primary osteoarthritis, right shoulder: Secondary | ICD-10-CM | POA: Diagnosis not present

## 2023-05-22 DIAGNOSIS — J45909 Unspecified asthma, uncomplicated: Secondary | ICD-10-CM | POA: Insufficient documentation

## 2023-05-22 DIAGNOSIS — E119 Type 2 diabetes mellitus without complications: Secondary | ICD-10-CM | POA: Insufficient documentation

## 2023-05-22 DIAGNOSIS — I10 Essential (primary) hypertension: Secondary | ICD-10-CM | POA: Diagnosis not present

## 2023-05-22 DIAGNOSIS — Z8249 Family history of ischemic heart disease and other diseases of the circulatory system: Secondary | ICD-10-CM | POA: Insufficient documentation

## 2023-05-22 DIAGNOSIS — E039 Hypothyroidism, unspecified: Secondary | ICD-10-CM

## 2023-05-22 DIAGNOSIS — Z833 Family history of diabetes mellitus: Secondary | ICD-10-CM | POA: Insufficient documentation

## 2023-05-22 DIAGNOSIS — G8918 Other acute postprocedural pain: Secondary | ICD-10-CM | POA: Diagnosis not present

## 2023-05-22 LAB — GLUCOSE, CAPILLARY
Glucose-Capillary: 125 mg/dL — ABNORMAL HIGH (ref 70–99)
Glucose-Capillary: 118 mg/dL — ABNORMAL HIGH (ref 70–99)

## 2023-05-22 SURGERY — ARTHROPLASTY, SHOULDER, TOTAL, REVERSE
Anesthesia: General | Site: Shoulder | Laterality: Right

## 2023-05-22 MED ORDER — AMISULPRIDE (ANTIEMETIC) 5 MG/2ML IV SOLN: 10.0000 mg | Freq: Once | INTRAVENOUS | Status: DC | PRN

## 2023-05-22 MED ORDER — PHENYLEPHRINE HCL-NACL 20-0.9 MG/250ML-% IV SOLN
INTRAVENOUS | Status: DC | PRN
  Administered 2023-05-22: 25 ug/min via INTRAVENOUS

## 2023-05-22 MED ORDER — ONDANSETRON HCL 4 MG/2ML IJ SOLN
INTRAMUSCULAR | Status: AC
  Filled 2023-05-22: qty 2

## 2023-05-22 MED ORDER — VANCOMYCIN HCL 1000 MG IV SOLR
INTRAVENOUS | Status: AC
  Filled 2023-05-22: qty 20

## 2023-05-22 MED ORDER — DEXAMETHASONE SODIUM PHOSPHATE 10 MG/ML IJ SOLN
INTRAMUSCULAR | Status: DC | PRN
  Administered 2023-05-22: 8 mg via INTRAVENOUS

## 2023-05-22 MED ORDER — ORAL CARE MOUTH RINSE: 15.0000 mL | Freq: Once | OROMUCOSAL | Status: AC

## 2023-05-22 MED ORDER — BUPIVACAINE HCL (PF) 0.5 % IJ SOLN
INTRAMUSCULAR | Status: DC | PRN
  Administered 2023-05-22: 20 mL via PERINEURAL

## 2023-05-22 MED ORDER — PROPOFOL 10 MG/ML IV BOLUS
INTRAVENOUS | Status: DC | PRN
  Administered 2023-05-22: 130 mg via INTRAVENOUS

## 2023-05-22 MED ORDER — PROPOFOL 10 MG/ML IV BOLUS
INTRAVENOUS | Status: AC
  Filled 2023-05-22: qty 20

## 2023-05-22 MED ORDER — BUPIVACAINE LIPOSOME 1.3 % IJ SUSP
INTRAMUSCULAR | Status: DC | PRN
  Administered 2023-05-22: 10 mL via PERINEURAL

## 2023-05-22 MED ORDER — CEFAZOLIN SODIUM-DEXTROSE 2-4 GM/100ML-% IV SOLN
2.0000 g | INTRAVENOUS | Status: AC
  Administered 2023-05-22: 2 g via INTRAVENOUS
  Filled 2023-05-22: qty 100

## 2023-05-22 MED ORDER — SUGAMMADEX SODIUM 200 MG/2ML IV SOLN
INTRAVENOUS | Status: AC
  Filled 2023-05-22: qty 2

## 2023-05-22 MED ORDER — SUGAMMADEX SODIUM 200 MG/2ML IV SOLN
INTRAVENOUS | Status: DC | PRN
  Administered 2023-05-22: 200 mg via INTRAVENOUS

## 2023-05-22 MED ORDER — DEXAMETHASONE SODIUM PHOSPHATE 10 MG/ML IJ SOLN
INTRAMUSCULAR | Status: AC
  Filled 2023-05-22: qty 1

## 2023-05-22 MED ORDER — 0.9 % SODIUM CHLORIDE (POUR BTL) OPTIME
TOPICAL | Status: DC | PRN
  Administered 2023-05-22: 1000 mL

## 2023-05-22 MED ORDER — MIDAZOLAM HCL 2 MG/2ML IJ SOLN
2.0000 mg | Freq: Once | INTRAMUSCULAR | Status: AC
  Administered 2023-05-22: 2 mg via INTRAVENOUS
  Filled 2023-05-22: qty 2

## 2023-05-22 MED ORDER — CHLORHEXIDINE GLUCONATE 0.12 % MT SOLN
15.0000 mL | Freq: Once | OROMUCOSAL | Status: AC
  Administered 2023-05-22: 15 mL via OROMUCOSAL

## 2023-05-22 MED ORDER — HYDROMORPHONE HCL 1 MG/ML IJ SOLN: 0.2500 mg | INTRAMUSCULAR | Status: DC | PRN

## 2023-05-22 MED ORDER — INSULIN ASPART 100 UNIT/ML IJ SOLN
0.0000 [IU] | INTRAMUSCULAR | Status: DC | PRN
Start: 2023-05-22 — End: 2023-05-22

## 2023-05-22 MED ORDER — ONDANSETRON HCL 4 MG PO TABS: 4.0000 mg | ORAL_TABLET | Freq: Three times a day (TID) | ORAL | 0 refills | Status: DC | PRN

## 2023-05-22 MED ORDER — OXYCODONE-ACETAMINOPHEN 5-325 MG PO TABS: 1.0000 | ORAL_TABLET | ORAL | 0 refills | Status: DC | PRN

## 2023-05-22 MED ORDER — VANCOMYCIN HCL 1000 MG IV SOLR
INTRAVENOUS | Status: DC | PRN
  Administered 2023-05-22: 1000 mg via TOPICAL

## 2023-05-22 MED ORDER — LIDOCAINE HCL (PF) 2 % IJ SOLN
INTRAMUSCULAR | Status: DC | PRN
Start: 2023-05-22 — End: 2023-05-22
  Administered 2023-05-22: 40 mg via INTRADERMAL

## 2023-05-22 MED ORDER — FENTANYL CITRATE PF 50 MCG/ML IJ SOSY
100.0000 ug | PREFILLED_SYRINGE | Freq: Once | INTRAMUSCULAR | Status: AC
  Administered 2023-05-22: 50 ug via INTRAVENOUS
  Filled 2023-05-22: qty 2

## 2023-05-22 MED ORDER — ROCURONIUM BROMIDE 10 MG/ML (PF) SYRINGE
PREFILLED_SYRINGE | INTRAVENOUS | Status: AC
  Filled 2023-05-22: qty 10

## 2023-05-22 MED ORDER — LIDOCAINE HCL (PF) 2 % IJ SOLN
INTRAMUSCULAR | Status: AC
  Filled 2023-05-22: qty 5

## 2023-05-22 MED ORDER — ONDANSETRON HCL 4 MG/2ML IJ SOLN
INTRAMUSCULAR | Status: DC | PRN
  Administered 2023-05-22: 4 mg via INTRAVENOUS

## 2023-05-22 MED ORDER — OXYCODONE HCL 5 MG/5ML PO SOLN: 5.0000 mg | Freq: Once | ORAL | Status: DC | PRN

## 2023-05-22 MED ORDER — OXYCODONE HCL 5 MG PO TABS: 5.0000 mg | ORAL_TABLET | Freq: Once | ORAL | Status: DC | PRN

## 2023-05-22 MED ORDER — SODIUM CHLORIDE 0.9 % IV SOLN: 12.5000 mg | INTRAVENOUS | Status: DC | PRN

## 2023-05-22 MED ORDER — CYCLOBENZAPRINE HCL 10 MG PO TABS: 10.0000 mg | ORAL_TABLET | Freq: Three times a day (TID) | ORAL | 1 refills | Status: AC | PRN

## 2023-05-22 MED ORDER — LACTATED RINGERS IV SOLN
INTRAVENOUS | Status: DC
Start: 2023-05-22 — End: 2023-05-22

## 2023-05-22 MED ORDER — TRANEXAMIC ACID-NACL 1000-0.7 MG/100ML-% IV SOLN
1000.0000 mg | INTRAVENOUS | Status: AC
  Administered 2023-05-22: 1000 mg via INTRAVENOUS
  Filled 2023-05-22: qty 100

## 2023-05-22 MED ORDER — ROCURONIUM BROMIDE 10 MG/ML (PF) SYRINGE
PREFILLED_SYRINGE | INTRAVENOUS | Status: DC | PRN
  Administered 2023-05-22: 70 mg via INTRAVENOUS

## 2023-05-22 SURGICAL SUPPLY — 63 items
BAG COUNTER SPONGE SURGICOUNT (BAG) IMPLANT
BAG ZIPLOCK 12X15 (MISCELLANEOUS) ×1 IMPLANT
BIT DRILL AR 3 NS (BIT) IMPLANT
BLADE SAW SGTL 83.5X18.5 (BLADE) ×1 IMPLANT
BNDG COHESIVE 4X5 TAN STRL LF (GAUZE/BANDAGES/DRESSINGS) ×1 IMPLANT
CLSR STERI-STRIP ANTIMIC 1/2X4 (GAUZE/BANDAGES/DRESSINGS) IMPLANT
COOLER ICEMAN CLASSIC (MISCELLANEOUS) ×1 IMPLANT
COVER BACK TABLE 60X90IN (DRAPES) ×1 IMPLANT
COVER SURGICAL LIGHT HANDLE (MISCELLANEOUS) ×1 IMPLANT
CUP SUT UNIV REVERS 36 NEUTRAL (Cup) IMPLANT
DRAPE SHEET LG 3/4 BI-LAMINATE (DRAPES) ×1 IMPLANT
DRAPE SURG 17X11 SM STRL (DRAPES) ×1 IMPLANT
DRAPE SURG ORHT 6 SPLT 77X108 (DRAPES) ×2 IMPLANT
DRAPE TOP 10253 STERILE (DRAPES) ×1 IMPLANT
DRAPE U-SHAPE 47X51 STRL (DRAPES) ×1 IMPLANT
DRESSING AQUACEL AG SP 3.5X6 (GAUZE/BANDAGES/DRESSINGS) ×1 IMPLANT
DRSG AQUACEL AG ADV 3.5X 6 (GAUZE/BANDAGES/DRESSINGS) IMPLANT
DRSG AQUACEL AG ADV 3.5X10 (GAUZE/BANDAGES/DRESSINGS) IMPLANT
DRSG AQUACEL AG SP 3.5X6 (GAUZE/BANDAGES/DRESSINGS) ×1 IMPLANT
DURAPREP 26ML APPLICATOR (WOUND CARE) ×1 IMPLANT
ELECT BLADE TIP CTD 4 INCH (ELECTRODE) ×1 IMPLANT
ELECT PENCIL ROCKER SW 15FT (MISCELLANEOUS) ×1 IMPLANT
ELECT REM PT RETURN 15FT ADLT (MISCELLANEOUS) ×1 IMPLANT
FACESHIELD WRAPAROUND (MASK) ×5 IMPLANT
FACESHIELD WRAPAROUND OR TEAM (MASK) ×5 IMPLANT
GLENOID UNI REV MOD 24 +2 LAT (Joint) IMPLANT
GLENOSPHERE 36 +4 LAT/24 (Joint) IMPLANT
GLOVE BIO SURGEON STRL SZ7.5 (GLOVE) ×1 IMPLANT
GLOVE BIO SURGEON STRL SZ8 (GLOVE) ×1 IMPLANT
GLOVE SS BIOGEL STRL SZ 7 (GLOVE) ×1 IMPLANT
GLOVE SS BIOGEL STRL SZ 7.5 (GLOVE) ×1 IMPLANT
GOWN STRL SURGICAL XL XLNG (GOWN DISPOSABLE) ×2 IMPLANT
INSERT HUMERAL UNI REVERS 36 3 (Insert) IMPLANT
KIT BASIN OR (CUSTOM PROCEDURE TRAY) ×1 IMPLANT
KIT TURNOVER KIT A (KITS) IMPLANT
MANIFOLD NEPTUNE II (INSTRUMENTS) ×1 IMPLANT
NDL TAPERED W/ NITINOL LOOP (MISCELLANEOUS) ×1 IMPLANT
NEEDLE TAPERED W/ NITINOL LOOP (MISCELLANEOUS) ×1 IMPLANT
NS IRRIG 1000ML POUR BTL (IV SOLUTION) ×1 IMPLANT
PACK SHOULDER (CUSTOM PROCEDURE TRAY) ×1 IMPLANT
PAD ARMBOARD POSITIONER FOAM (MISCELLANEOUS) ×1 IMPLANT
PAD COLD SHLDR WRAP-ON (PAD) ×1 IMPLANT
PIN NITINOL TARGETER 2.8 (PIN) IMPLANT
PIN SET MODULAR GLENOID SYSTEM (PIN) IMPLANT
RESTRAINT HEAD UNIVERSAL NS (MISCELLANEOUS) ×1 IMPLANT
SCREW CENTRAL MODULAR 25 (Screw) IMPLANT
SCREW PERI LOCK 5.5X32 (Screw) IMPLANT
SCREW PERI LOCK 5.5X36 (Screw) IMPLANT
SCREW PERIPHERAL 5.5X20 LOCK (Screw) IMPLANT
SLING ARM FOAM STRAP LRG (SOFTGOODS) IMPLANT
SLING ARM FOAM STRAP MED (SOFTGOODS) IMPLANT
STEM HUMERAL UNI REVERSE SZ10 (Stem) IMPLANT
STRIP CLOSURE SKIN 1/2X4 (GAUZE/BANDAGES/DRESSINGS) ×1 IMPLANT
SUT MNCRL AB 3-0 PS2 18 (SUTURE) ×1 IMPLANT
SUT MON AB 2-0 CT1 36 (SUTURE) ×1 IMPLANT
SUT VIC AB 1 CT1 36 (SUTURE) ×1 IMPLANT
SUTURE TAPE 1.3 40 TPR END (SUTURE) ×2 IMPLANT
SUTURETAPE 1.3 40 TPR END (SUTURE) ×2 IMPLANT
TOWEL GREEN STERILE FF (TOWEL DISPOSABLE) ×1 IMPLANT
TOWEL OR 17X26 10 PK STRL BLUE (TOWEL DISPOSABLE) ×1 IMPLANT
TUBE SUCTION HIGH CAP CLEAR NV (SUCTIONS) ×1 IMPLANT
TUBING CONNECTING 10 (TUBING) ×1 IMPLANT
WATER STERILE IRR 1000ML POUR (IV SOLUTION) ×2 IMPLANT

## 2023-05-22 NOTE — Discharge Instructions (Signed)

## 2023-05-22 NOTE — Anesthesia Preprocedure Evaluation (Signed)
 Anesthesia Evaluation  Patient identified by MRN, date of birth, ID band Patient awake    Reviewed: Allergy & Precautions, H&P , NPO status , Patient's Chart, lab work & pertinent test results  Airway Mallampati: III  TM Distance: <3 FB Neck ROM: Full    Dental no notable dental hx.    Pulmonary asthma , sleep apnea and Continuous Positive Airway Pressure Ventilation    Pulmonary exam normal breath sounds clear to auscultation       Cardiovascular hypertension, Pt. on medications Normal cardiovascular exam+ dysrhythmias Atrial Fibrillation  Rhythm:Irregular Rate:Normal  Pharmacological myocardial perfusion imaging study with no significant  ischemia GI uptake artifact noted Normal wall motion, EF estimated at 75% No EKG changes concerning for ischemia at peak stress or in recovery. CT attenuation correction images with no significant coronary calcification, minimal aortic atherosclerosis Low risk scan    Neuro/Psych  Headaches  Anxiety Depression     negative psych ROS   GI/Hepatic Neg liver ROS,GERD  ,,  Endo/Other  diabetes, Type 2Hypothyroidism    Renal/GU negative Renal ROS  negative genitourinary   Musculoskeletal  (+) Arthritis , Osteoarthritis,    Abdominal   Peds negative pediatric ROS (+)  Hematology  (+) Blood dyscrasia, anemia   Anesthesia Other Findings   Reproductive/Obstetrics negative OB ROS                             Anesthesia Physical Anesthesia Plan  ASA: 3  Anesthesia Plan: General   Post-op Pain Management: Minimal or no pain anticipated and Regional block*   Induction: Intravenous  PONV Risk Score and Plan: 3 and Ondansetron, Dexamethasone, Treatment may vary due to age or medical condition and Midazolam  Airway Management Planned: Oral ETT  Additional Equipment:   Intra-op Plan:   Post-operative Plan: Extubation in OR  Informed Consent: I have  reviewed the patients History and Physical, chart, labs and discussed the procedure including the risks, benefits and alternatives for the proposed anesthesia with the patient or authorized representative who has indicated his/her understanding and acceptance.     Dental advisory given  Plan Discussed with: CRNA and Surgeon  Anesthesia Plan Comments:        Anesthesia Quick Evaluation

## 2023-05-22 NOTE — Anesthesia Procedure Notes (Signed)
 Anesthesia Regional Block: Interscalene brachial plexus block   Pre-Anesthetic Checklist: , timeout performed,  Correct Patient, Correct Site, Correct Laterality,  Correct Procedure, Correct Position, site marked,  Risks and benefits discussed,  Surgical consent,  Pre-op evaluation,  At surgeon's request and post-op pain management  Laterality: Right  Prep: chloraprep       Needles:  Injection technique: Single-shot  Needle Type: Stimiplex     Needle Length: 9cm  Needle Gauge: 21     Additional Needles:   Procedures:,,,, ultrasound used (permanent image in chart),,    Narrative:  Start time: 05/22/2023 8:47 AM End time: 05/22/2023 8:52 AM Injection made incrementally with aspirations every 5 mL.  Performed by: Personally  Anesthesiologist: Lowella Curb, MD

## 2023-05-22 NOTE — Transfer of Care (Signed)
 Immediate Anesthesia Transfer of Care Note  Patient: [name]  Procedure(s) Performed: ARTHROPLASTY, SHOULDER, TOTAL, REVERSE (Right: Shoulder)  Patient Location: PACU  Anesthesia Type:General  Level of Consciousness: drowsy  Airway & Oxygen Therapy: Patient Spontanous Breathing and Patient connected to face mask oxygen  Post-op Assessment: Report given to RN and Post -op Vital signs reviewed and stable  Post vital signs: Reviewed and stable  Last Vitals:  Vitals Value Taken Time  BP 177/83 05/22/23 1146  Temp    Pulse 76 05/22/23 1147  Resp 16 05/22/23 1147  SpO2 100 % 05/22/23 1147  Vitals shown include unfiled device data.  Last Pain:  Vitals:   05/22/23 0930  TempSrc:   PainSc: 0-No pain         Complications: No notable events documented.

## 2023-05-22 NOTE — Anesthesia Procedure Notes (Signed)
 Procedure Name: Intubation Date/Time: 05/22/2023 10:16 AM  Performed by: Nelle Don, CRNAPre-anesthesia Checklist: Patient identified, Emergency Drugs available, Suction available and Patient being monitored Patient Re-evaluated:Patient Re-evaluated prior to induction Oxygen Delivery Method: Circle system utilized Preoxygenation: Pre-oxygenation with 100% oxygen Induction Type: IV induction Ventilation: Mask ventilation without difficulty Laryngoscope Size: 4 and Mac Grade View: Grade II Tube type: Oral Tube size: 7.5 mm Number of attempts: 1 Airway Equipment and Method: Stylet Placement Confirmation: ETT inserted through vocal cords under direct vision, positive ETCO2 and breath sounds checked- equal and bilateral Secured at: 23 cm Tube secured with: Tape Dental Injury: Teeth and Oropharynx as per pre-operative assessment

## 2023-05-22 NOTE — Op Note (Signed)
 05/22/2023  11:41 AM  PATIENT:   [name]  78 y.o. female  PRE-OPERATIVE DIAGNOSIS:  Right Shoulder osteoarthritis  POST-OPERATIVE DIAGNOSIS: Same  PROCEDURE: Right shoulder reverse arthroplasty utilizing a press-fit size 10 Arthrex stem with a neutral metathesis, +3 constrained polyethylene insert, 36/+4 glenosphere and a small/+2 baseplate  SURGEON:  Keene Gilkey, Vania Rea M.D.  ASSISTANTS: Ralene Bathe, PA-C  Ralene Bathe, PA-C was utilized as an Geophysicist/field seismologist throughout this case, essential for help with positioning the patient, positioning extremity, tissue manipulation, implantation of the prosthesis, suture management, wound closure, and intraoperative decision-making.  ANESTHESIA:   General Endotracheal and interscalene block with Exparel  EBL: 150 cc  SPECIMEN: None  Drains: None   PATIENT DISPOSITION:  PACU - hemodynamically stable.    PLAN OF CARE: Discharge to home after PACU  Brief history:  Patient is a 78 year old female with chronic and progressive increasing right shoulder pain with severely restricted mobility secondary to severe osteoarthritis.  She is brought to the operating this time for planned right shoulder reverse arthroplasty.  Preoperatively, I counseled the patient regarding treatment options and risks versus benefits thereof.  Possible surgical complications were all reviewed including potential for bleeding, infection, neurovascular injury, persistent pain, loss of motion, anesthetic complication, failure of the implant, and possible need for additional surgery. They understand and accept and agrees with our planned procedure.   Procedure detail:  After undergoing routine preop evaluation the patient received prophylactic antibiotics and interscalene block with Exparel established in the holding area by the anesthesia department.  Subsequently placed supine on the operating table and underwent the smooth induction of a general endotracheal  anesthesia.  Placed into the beach chair position and appropriately padded and protected.  The right shoulder girdle region was sterilely prepped and draped in standard fashion.  Timeout was called.  A deltopectoral approach to the right shoulder is made an approximately 8 cm incision.  Skin flaps elevated dissection carried deeply the deltopectoral interval was then developed from proximal to distal with the vein taken laterally.  Conjoined tendon was mobilized and retracted medially.  The long head biceps tendon was then tenodesed at the upper border the pectoralis major tendon with the proximal segment unroofed and excised.  The rotator cuff superiorly was split from the apex of the bicipital groove to the base of the coracoid and the subscap was then separated from the lesser tuberosity using electrocautery and the margin was tagged with a pair of grasping suture tape sutures.  Capsular attachments were then divided from the anterior and inferior margins of the humeral neck allowing deliver the humeral head through the wound.  An extra medullary guide was then used to outline the proposed humeral head resection which we performed with an oscillating saw at approximate 20 degrees of retroversion.  Marginal osteophytes were removed with a rondure and a metal cap placed of the cut proximal humeral surface.  The glenoid was then exposed and a circumferential labral resection was performed.  Guidepin was then directed into the center of the glenoid and glenoid was then reamed with the central followed by the peripheral reamer to a stable subchondral bony bed.  All soft tissue bony debris was then carefully removed.  Preparation completed with a drill and tapped for a 25 mm lag screw.  Our baseplate was then assembled and inserted with vancomycin powder applied to the threads of the lag screw with excellent fixation.  The peripheral locking screws were then placed using standard technique with excellent  fixation.  A  36/+4 glenosphere was then impacted onto the baseplate and a central locking screw was placed.  Our attention was then returned back to the humeral metaphysis where we did identify some moderate osteoporosis.  Canal was opened and ultimately broached up to a size 10 stem at approximate 20 degrees of retroversion.  A neutral metaphyseal reaming guide was then used to prepare the metaphysis.  A trial implant was placed trial reduction showed good motion stability and soft tissue balance.  Our trial was then removed.  The canal was irrigated cleaned and dried and vancomycin powder applied into the canal.  The final implant was assembled and inserted with excellent fixation.  Trial reduction showed good motion stability and soft tissue balance optimized with a +3 poly.  A +3 constrained poly was then impacted the implant and final reduction was performed showing good motion stability and soft tissue balance all much to our satisfaction.  At this point the subscapularis was then mobilized confirming good elasticity and was then repaired back to the eyelets on the collar of the implant using the previously placed suture tape sutures.  Final irrigation was then completed.  Hemostasis was obtained.  Balance of vancomycin powder sprayed liberally throughout the deep soft tissue planes.  Deltopectoral interval Loes with a series of interrupted #1 Vicryl sutures.  Interrupted 2-0 Monocryl used to close the subcu layer and intracuticular 3-0 Monocryl used to close the skin followed by Steri-Strips and Aquacel dressing.  The right arm placed into a sling.  The patient was awakened, extubated, and taken to recovery in stable condition.  Senaida Lange MD   Contact # [phone]

## 2023-05-22 NOTE — Progress Notes (Signed)
 [facility] Summary Report / Loop Recorder

## 2023-05-22 NOTE — Anesthesia Postprocedure Evaluation (Signed)
 Anesthesia Post Note  Patient: [name]  Procedure(s) Performed: ARTHROPLASTY, SHOULDER, TOTAL, REVERSE (Right: Shoulder)     Patient location during evaluation: PACU Anesthesia Type: General Level of consciousness: awake and alert Pain management: pain level controlled Vital Signs Assessment: post-procedure vital signs reviewed and stable Respiratory status: spontaneous breathing, nonlabored ventilation and respiratory function stable Cardiovascular status: blood pressure returned to baseline and stable Postop Assessment: no apparent nausea or vomiting Anesthetic complications: no   No notable events documented.  Last Vitals:  Vitals:   05/22/23 1315 05/22/23 1330  BP: (!) 151/79 (!) 150/79  Pulse: 82   Resp: 18 20  Temp:  36.5 C  SpO2: 95% 95%    Last Pain:  Vitals:   05/22/23 1330  TempSrc:   PainSc: 0-No pain                 Lowella Curb

## 2023-05-22 NOTE — Care Plan (Signed)
 Ortho Bundle Case Management Note  Patient Details  Name: [name] MRN: [id number] Date of Birth: 09-07-1945  R Rev TSA on 05/22/23  DCP: Home with husband  DME: No needs  PT: EO                 DME Arranged:  N/A DME Agency:  NA  HH Arranged:  NA HH Agency:     Additional Comments: Please contact me with any questions of if this plan should need to change.  Despina Pole, CCM [facility] [phone]   05/22/2023, 11:25 AM

## 2023-05-22 NOTE — Evaluation (Signed)
 Occupational Therapy Evaluation Patient Details Name: [name] MRN: [id number] DOB: 12/11/1945 Today's Date: 05/22/2023   History of Present Illness   R reversed TSA PMH: DM, afib, L Reverse TSA last year, OSA, OA     Clinical Impressions PTA pt lives with husband and was independent with all aspects of A/IADL's prior to surgery including driving.  Husband will be present and able to assist 24/7. Education completed regarding compensatory strategies for ADL tasks and functional mobility, management of sling,  ROM per specified parameters in the order set as indicated below, positioning of operative arm in sitting and supine and edema control, including use of "Iceman" Cold Therapy machine. Caregiver present for education, written handouts provided and reviewed using Teach Back and pt/caregiver verbalized/demonstrated understanding. Due to the below listed deficits, pt requires mod assistance with ADL tasks and CGA assist with functional mobility. OT worked with patient and caregiver to ambulate and use bathroom reinforcing protocol and safety strategies with husband able to teach back independently as patient was mildly unsteady on feet initially. Caregiver will be able to provide necessary level of assistance at discharge. Pt to follow up with MD to progress rehab of the operative shoulder.      If plan is discharge home, recommend the following:   A little help with walking and/or transfers;A lot of help with bathing/dressing/bathroom;Assistance with cooking/housework;Direct supervision/assist for medications management;Help with stairs or ramp for entrance;Assist for transportation;Direct supervision/assist for financial management     Functional Status Assessment   Patient has had a recent decline in their functional status and demonstrates the ability to make significant improvements in function in a reasonable and predictable amount of time.     Equipment Recommendations    None recommended by OT      Precautions/Restrictions   Precautions Precautions: Shoulder Required Braces or Orthoses: Sling Restrictions Weight Bearing Restrictions Per Provider Order: Yes RUE Weight Bearing Per Provider Order: Non weight bearing Shoulder FF 0-60; ER 0-20; Abd 0-45. ROM for ADL purposes only; AROM elbow/wrist/hand; OK for lap slides and pendulums; Loosen sling from neck when arm is supported in sitting     Mobility Bed Mobility      Pt in recliner               Transfers Overall transfer level: Needs assistance   Transfers: Sit to/from Stand, Bed to chair/wheelchair/BSC Sit to Stand: Contact guard assist           General transfer comment: husband teach back on safe mobilization      Balance Overall balance assessment: Mild deficits observed, not formally tested                                         ADL either performed or assessed with clinical judgement   ADL Overall ADL's : Needs assistance/impaired  Per orders, R shoulder parameters as follows for ADL tasks: Abd 0-45; ER 0-20; FF 0-60.  While moving within specified parameters, pt/caregiver instructed on bathing and how to donn/doff shirt, placing operative arm through sleeve first when donning and off last when doffing. Pt/caregiver educated on compensatory strategies for LB ADL and strategies to reduce risk of falls.  Pt/caregiver educated on donning/doffing sling and to wear the sling at all times with the exception of ADL, and to loosen the neck strap of the sling when the operative arm is in a supported  position when sitting. In sitting or supine, pt instructed to have a pillow behind and under their operative arm to provide support. During assisted ambulation to and from the bathroom and for in home mobility, caregiver educated on the importance of walking on pt's non-operative side.  Education regarding use of "IceMan" Cold Therapy completed, including the importance of  using a barrier on the shoulder prior to positioning the wrap-on pad. Pt/caregiver verbalized/demonstrated understanding. Teach Back used while caregiver assisted with dressing pt and positioning "wrap-on pad" to facilitate DC.                                    Functional mobility during ADLs: Contact guard assist       Vision Baseline Vision/History: 0 No visual deficits Ability to See in Adequate Light: 0 Adequate Patient Visual Report: No change from baseline Vision Assessment?: No apparent visual deficits     Perception Perception: Not tested       Praxis Praxis: Not tested       Pertinent Vitals/Pain Pain Assessment Pain Assessment: No/denies pain     Extremity/Trunk Assessment Upper Extremity Assessment Upper Extremity Assessment: Left hand dominant;RUE deficits/detail RUE Deficits / Details: post op R Reverse TSA with sling, protocol and precautions (refer to ADL, ex and prec section) RUE Coordination: decreased fine motor;decreased gross motor   Lower Extremity Assessment Lower Extremity Assessment: Overall WFL for tasks assessed   Cervical / Trunk Assessment Cervical / Trunk Assessment: Normal   Communication Communication Communication: No apparent difficulties   Cognition Arousal: Alert Behavior During Therapy: WFL for tasks assessed/performed Cognition: No apparent impairments                               Following commands: Intact       Cueing  General Comments   Cueing Techniques: Verbal cues  mild R UE post op edema with dressing at shoulder intact   Exercises Exercises: Shoulder Shoulder Exercises Pendulum Exercise: 5 reps Elbow Flexion: 5 reps Elbow Extension: 5 reps Wrist Flexion: 5 reps Wrist Extension: 5 reps Digit Composite Flexion: 5 reps Composite Extension: 5 reps   Shoulder Instructions Shoulder Instructions Donning/doffing shirt without moving shoulder: Caregiver independent with task Method  for sponge bathing under operated UE: Caregiver independent with task Donning/doffing sling/immobilizer: Caregiver independent with task Correct positioning of sling/immobilizer: Caregiver independent with task Pendulum exercises (written home exercise program): Caregiver independent with task (seated modifies dangle) ROM for elbow, wrist and digits of operated UE: Caregiver independent with task Sling wearing schedule (on at all times/off for ADL's): Caregiver independent with task Proper positioning of operated UE when showering: Caregiver independent with task Positioning of UE while sleeping: Caregiver independent with task    Home Living Family/patient expects to be discharged to:: Private residence Living Arrangements: Spouse/significant other Available Help at Discharge: Family Type of Home: House Home Access: Ramped entrance     Home Layout: Two level;Full bath on main level;Able to live on main level with bedroom/bathroom Alternate Level Stairs-Number of Steps: FF   Bathroom Shower/Tub: Walk-in shower;Door   [employer] Toilet: Handicapped height Bathroom Accessibility: Yes How Accessible: Accessible via walker Home Equipment: Control and instrumentation engineer (2 wheels);Cane - single point;Grab bars - tub/shower;Hand held shower head;Lift chair;Adaptive equipment Adaptive Equipment: Reacher Additional Comments: previous L shoulder surgery      Prior Functioning/Environment Prior Level of  Function : Independent/Modified Independent             Mobility Comments: independent ADLs Comments: indep with driving    OT Problem List: Decreased strength;Decreased range of motion;Decreased activity tolerance;Impaired balance (sitting and/or standing);Impaired UE functional use;Increased edema        OT Goals(Current goals can be found in the care plan section)   Acute Rehab OT Goals Patient Stated Goal: to use my arm soon OT Goal Formulation: With  patient/family Time For Goal Achievement: 05/22/23 Potential to Achieve Goals: Good   AM-PAC OT "6 Clicks" Daily Activity     Outcome Measure Help from another person eating meals?: A Little Help from another person taking care of personal grooming?: A Little Help from another person toileting, which includes using toliet, bedpan, or urinal?: A Little Help from another person bathing (including washing, rinsing, drying)?: A Little Help from another person to put on and taking off regular upper body clothing?: A Lot Help from another person to put on and taking off regular lower body clothing?: A Lot 6 Click Score: 16   End of Session Equipment Utilized During Treatment: Gait belt Nurse Communication: Mobility status;Precautions;Weight bearing status  Activity Tolerance: Patient tolerated treatment well Patient left: in chair;with nursing/sitter in room;with call bell/phone within reach  OT Visit Diagnosis: Unsteadiness on feet (R26.81);Muscle weakness (generalized) (M62.81)                Time: 9528-4132 OT Time Calculation (min): 62 min Charges:  OT General Charges $OT Visit: 1 Visit OT Evaluation $OT Eval Low Complexity: 1 Low OT Treatments $Self Care/Home Management : 38-52 mins Trystin Terhune OT/L Acute Rehabilitation Department  [phone] . 05/22/2023, 2:50 PM

## 2023-05-22 NOTE — H&P (Signed)
 [name]    Chief Complaint: Right Shoulder osteoarthritis HPI: The patient is a 78 y.o. female with chronic and progressive increasing right shoulder pain related to severe osteoarthritis with bony deformity and symptoms failing to respond to prolonged attempts at conservative management.  She does have a remote history of left shoulder reverse arthroplasty which she has done relatively well with.  Patient brought to the operating this time for planned right shoulder reverse arthroplasty.  Past Medical History:  Diagnosis Date   A-fib [place] [facility])    Anemia    Anxiety    Arthritis    Asthma    Chest pain    a. 09/2013 MV: Low risk; b. 12/2016 Coronary CTA/morphology: Ca2+ = 0; c. 11/2021 MV: EF 75%, no isch/infarct. Ao atherosclerosis noted. No signif Cor Ca2+.   Colon polyps    Constipation    Depressive disorder, not elsewhere classified    Diabetes mellitus without complication (HCC)    Diaphragmatic hernia without mention of obstruction or gangrene    Diastolic dysfunction    a. 01/2022 Echo: EF 55-60%, no rwma, GrII DD, nl RV size/fxn, RVSP 33.35mmHg. Sev dil LA, mild MR.   Disorders of bursae and tendons in shoulder region, unspecified    Diverticulosis of colon (without mention of hemorrhage)    Dysrhythmia    External hemorrhoid    Fundic gland polyps of stomach, benign    GERD (gastroesophageal reflux disease)    History of hiatal hernia    HTN (hypertension)    Hypertension    Hypothyroidism    Internal hemorrhoids without mention of complication    Mitral regurgitation    a. 01/2022 Echo: Mild MR.   Obesity, unspecified    OSA on CPAP    compliant with CPAP   Other urinary incontinence    Persistent atrial fibrillation (HCC)    a. Dx 09/2013 s/p multiple DCCVS; b. 12/2016 s/p PVI; c. 2023 Flecainide changed to Tikosyn; d. 04/2022 s/p redo PVI/fib/flutter ablation.   Pneumonia    Pure hypercholesterolemia    Tubular adenoma of colon    Type II diabetes mellitus (HCC)     Urinary frequency    Urinary urgency    Vitamin B 12 deficiency       Past Surgical History:  Procedure Laterality Date   ABDOMINAL HYSTERECTOMY  1972   Have both ovaries   ATRIAL FIBRILLATION ABLATION  12/17/2016   ATRIAL FIBRILLATION ABLATION N/A 12/17/2016   Procedure: ATRIAL FIBRILLATION ABLATION;  Surgeon: [name], MD;  Location: [facility];  Service: Cardiovascular;  Laterality: N/A;   ATRIAL FIBRILLATION ABLATION N/A 04/23/2022   Procedure: ATRIAL FIBRILLATION ABLATION;  Surgeon: [name], MD;  Location: [facility] INVAS[facility] CV LAB;  Service: Cardiovascular;  Laterality: N/A;   CARDIAC CATHETERIZATION  1990s   [facility]; "[name]"   CARDIOVERSION N/A 05/27/2016   Procedure: CARDIOVERSION;  Surgeon: [name], MD;  Location: [facility] ORS;  Service: Cardiovascular;  Laterality: N/A;   CARDIOVERSION N/A 11/25/2016   Procedure: CARDIOVERSION;  Surgeon: [name], MD;  Location: [facility] ORS;  Service: Cardiovascular;  Laterality: N/A;   CARDIOVERSION N/A 09/18/2018   Procedure: CARDIOVERSION;  Surgeon: [name], MD;  Location: [facility] ORS;  Service: Cardiovascular;  Laterality: N/A;   CARDIOVERSION N/A 07/28/2019   Procedure: CARDIOVERSION;  Surgeon: [name], MD;  Location: [facility] ORS;  Service: Cardiovascular;  Laterality: N/A;   CARDIOVERSION N/A 09/07/2019   Procedure: CARDIOVERSION;  Surgeon: [name], MD;  Location: [facility] ORS;  Service: Cardiovascular;  Laterality: N/A;   CARDIOVERSION N/A 02/19/2022   Procedure: CARDIOVERSION;  Surgeon: [name], MD;  Location: [facility] ORS;  Service: Cardiovascular;  Laterality: N/A;   CATARACT EXTRACTION W/ INTRAOCULAR LENS  IMPLANT, BILATERAL Bilateral    COLONOSCOPY     ELECTROPHYSIOLOGIC STUDY N/A 01/24/2016   Procedure: CARDIOVERSION;  Surgeon: [name], MD;  Location: [facility] ORS;  Service: Cardiovascular;  Laterality: N/A;   implantable loop recorder implantation  10/18/2019    [employer]tr[employer] Reveal [place] model [phone] G) implantable loop recorder    LAPAROSCOPIC CHOLECYSTECTOMY     never stimulator     for her bladder   REDUCTION MAMMAPLASTY Bilateral    SHOULDER SURGERY Left 10/03/2022   TONSILLECTOMY     TOTAL HIP ARTHROPLASTY Right 05/12/2018   Procedure: TOTAL HIP ARTHROPLASTY ANTERIOR APPROACH;  Surgeon: Durene Romans, MD;  Location: [facility] OR[facility];  Service: Orthopedics;  Laterality: Right;  70 mins    Family History  Problem Relation Age of Onset   Cancer Mother        fallopian tube   Dementia Mother    Pulmonary embolism Mother    Hyperlipidemia Mother    Diabetes Mother    Hyperlipidemia Father    Lung cancer Father    Hypertension Sister    Diabetes Brother    Cancer Brother    Stroke Paternal Grandmother    Stroke Paternal Grandfather    Kidney cancer Son    Dementia Maternal Aunt    Colon cancer Neg Hx    Esophageal cancer Neg Hx     Social History:  reports that she has never smoked. She has never used smokeless tobacco. She reports that she does not drink alcohol and does not use drugs.  BMI: Estimated body mass index is 28.29 kg/m as calculated from the following:   Height as of 05/21/23: 5\' 5"  (1.651 m).   Weight as of 05/21/23: 77.1 kg.  Lab Results  Component Value Date   ALBUMIN 3.3 (L) 10/05/2022   Diabetes:   Patient has a diagnosis of diabetes,  Lab Results  Component Value Date   HGBA1C 7.4 03/13/2023   Smoking Status:   reports that she has never smoked. She has never used smokeless tobacco.     No medications prior to admission.     Physical Exam: Right shoulder demonstrates painful and guarded motion as noted at recent office visit.  Her strength is globally decreased secondary to pain.  Neurovascular intact in the right upper extremity.  Imaging studies of the right shoulder confirm severe bony deformity with complete obliteration of the joint space, peripheral osteophyte formation, and subchondral  sclerosis.  Vitals  Temp:  [97 F (36.1 C)-98.4 F (36.9 C)] 98.4 F (36.9 C) (03/19 1033) Pulse Rate:  [65-70] 65 (03/19 1033) BP: (128-165)/(72-73) 165/73 (03/19 1033) SpO2:  [98 %-100 %] 100 % (03/19 1033) Weight:  [77.1 kg-77.6 kg] 77.1 kg (03/19 1033)  Assessment/Plan  Impression: Right Shoulder osteoarthritis  Plan of Action: Procedure(s): ARTHROPLASTY, SHOULDER, TOTAL, REVERSE  [name] 05/22/2023, 6:19 AM Contact # [phone]

## 2023-05-23 ENCOUNTER — Encounter (HOSPITAL_COMMUNITY): Payer: Self-pay | Admitting: Orthopedic Surgery

## 2023-05-26 ENCOUNTER — Other Ambulatory Visit: Payer: Self-pay | Admitting: Family Medicine

## 2023-05-28 DIAGNOSIS — I440 Atrioventricular block, first degree: Secondary | ICD-10-CM | POA: Diagnosis not present

## 2023-05-28 DIAGNOSIS — Z79899 Other long term (current) drug therapy: Secondary | ICD-10-CM | POA: Diagnosis not present

## 2023-05-28 DIAGNOSIS — Z95818 Presence of other cardiac implants and grafts: Secondary | ICD-10-CM | POA: Diagnosis not present

## 2023-05-28 DIAGNOSIS — Z7901 Long term (current) use of anticoagulants: Secondary | ICD-10-CM | POA: Diagnosis not present

## 2023-05-28 DIAGNOSIS — Z9889 Other specified postprocedural states: Secondary | ICD-10-CM | POA: Diagnosis not present

## 2023-05-28 DIAGNOSIS — I4891 Unspecified atrial fibrillation: Secondary | ICD-10-CM | POA: Diagnosis not present

## 2023-06-04 DIAGNOSIS — Z4731 Aftercare following explantation of shoulder joint prosthesis: Secondary | ICD-10-CM | POA: Diagnosis not present

## 2023-06-04 DIAGNOSIS — Z96611 Presence of right artificial shoulder joint: Secondary | ICD-10-CM | POA: Diagnosis not present

## 2023-06-10 DIAGNOSIS — E538 Deficiency of other specified B group vitamins: Secondary | ICD-10-CM | POA: Diagnosis not present

## 2023-06-10 DIAGNOSIS — E1165 Type 2 diabetes mellitus with hyperglycemia: Secondary | ICD-10-CM | POA: Diagnosis not present

## 2023-06-10 DIAGNOSIS — N390 Urinary tract infection, site not specified: Secondary | ICD-10-CM | POA: Diagnosis not present

## 2023-06-10 DIAGNOSIS — G4733 Obstructive sleep apnea (adult) (pediatric): Secondary | ICD-10-CM | POA: Diagnosis not present

## 2023-06-10 DIAGNOSIS — E871 Hypo-osmolality and hyponatremia: Secondary | ICD-10-CM | POA: Diagnosis not present

## 2023-06-10 DIAGNOSIS — I4891 Unspecified atrial fibrillation: Secondary | ICD-10-CM | POA: Diagnosis not present

## 2023-06-10 DIAGNOSIS — D509 Iron deficiency anemia, unspecified: Secondary | ICD-10-CM | POA: Diagnosis not present

## 2023-06-10 DIAGNOSIS — N3281 Overactive bladder: Secondary | ICD-10-CM | POA: Diagnosis not present

## 2023-06-10 DIAGNOSIS — Z8679 Personal history of other diseases of the circulatory system: Secondary | ICD-10-CM | POA: Diagnosis not present

## 2023-06-16 ENCOUNTER — Ambulatory Visit
Admission: RE | Admit: 2023-06-16 | Discharge: 2023-06-16 | Disposition: A | Discharge status: Home / Self Care | Payer: Medicare HMO | Source: Ambulatory Visit | Attending: Family Medicine | Admitting: Family Medicine

## 2023-06-16 DIAGNOSIS — Z780 Asymptomatic menopausal state: Secondary | ICD-10-CM

## 2023-06-16 DIAGNOSIS — M810 Age-related osteoporosis without current pathological fracture: Secondary | ICD-10-CM | POA: Diagnosis not present

## 2023-06-18 DIAGNOSIS — Z8679 Personal history of other diseases of the circulatory system: Secondary | ICD-10-CM | POA: Diagnosis not present

## 2023-06-18 DIAGNOSIS — I4891 Unspecified atrial fibrillation: Secondary | ICD-10-CM | POA: Diagnosis not present

## 2023-06-18 DIAGNOSIS — I10 Essential (primary) hypertension: Secondary | ICD-10-CM | POA: Diagnosis not present

## 2023-06-19 ENCOUNTER — Encounter: Payer: Self-pay | Admitting: Family Medicine

## 2023-06-24 DIAGNOSIS — M25511 Pain in right shoulder: Secondary | ICD-10-CM | POA: Diagnosis not present

## 2023-06-26 DIAGNOSIS — L821 Other seborrheic keratosis: Secondary | ICD-10-CM | POA: Diagnosis not present

## 2023-06-26 DIAGNOSIS — D1801 Hemangioma of skin and subcutaneous tissue: Secondary | ICD-10-CM | POA: Diagnosis not present

## 2023-06-26 DIAGNOSIS — L814 Other melanin hyperpigmentation: Secondary | ICD-10-CM | POA: Diagnosis not present

## 2023-06-26 DIAGNOSIS — L538 Other specified erythematous conditions: Secondary | ICD-10-CM | POA: Diagnosis not present

## 2023-06-26 DIAGNOSIS — L2989 Other pruritus: Secondary | ICD-10-CM | POA: Diagnosis not present

## 2023-06-26 DIAGNOSIS — L820 Inflamed seborrheic keratosis: Secondary | ICD-10-CM | POA: Diagnosis not present

## 2023-07-03 DIAGNOSIS — M25511 Pain in right shoulder: Secondary | ICD-10-CM | POA: Diagnosis not present

## 2023-07-08 DIAGNOSIS — M25511 Pain in right shoulder: Secondary | ICD-10-CM | POA: Diagnosis not present

## 2023-07-10 DIAGNOSIS — M25511 Pain in right shoulder: Secondary | ICD-10-CM | POA: Diagnosis not present

## 2023-07-11 ENCOUNTER — Other Ambulatory Visit: Payer: Self-pay | Admitting: Internal Medicine

## 2023-07-11 DIAGNOSIS — N3946 Mixed incontinence: Secondary | ICD-10-CM | POA: Diagnosis not present

## 2023-07-11 DIAGNOSIS — N3020 Other chronic cystitis without hematuria: Secondary | ICD-10-CM | POA: Diagnosis not present

## 2023-07-11 DIAGNOSIS — N3281 Overactive bladder: Secondary | ICD-10-CM | POA: Diagnosis not present

## 2023-07-11 DIAGNOSIS — R8271 Bacteriuria: Secondary | ICD-10-CM | POA: Diagnosis not present

## 2023-07-14 ENCOUNTER — Telehealth: Payer: Self-pay

## 2023-07-14 NOTE — Telephone Encounter (Signed)
 Return in about 4 months (around 12/26/2022).   Pt needs to make an appt for more refills. Please send message back to me when it has been scheduled.

## 2023-07-15 DIAGNOSIS — M25511 Pain in right shoulder: Secondary | ICD-10-CM | POA: Diagnosis not present

## 2023-07-16 NOTE — Telephone Encounter (Signed)
 Dr. Aldona Amel Refilled medication on 07/14/23

## 2023-07-22 DIAGNOSIS — M25511 Pain in right shoulder: Secondary | ICD-10-CM | POA: Diagnosis not present

## 2023-07-22 DIAGNOSIS — E119 Type 2 diabetes mellitus without complications: Secondary | ICD-10-CM | POA: Diagnosis not present

## 2023-07-22 DIAGNOSIS — F330 Major depressive disorder, recurrent, mild: Secondary | ICD-10-CM | POA: Diagnosis not present

## 2023-07-22 DIAGNOSIS — N390 Urinary tract infection, site not specified: Secondary | ICD-10-CM | POA: Diagnosis not present

## 2023-07-24 DIAGNOSIS — M25511 Pain in right shoulder: Secondary | ICD-10-CM | POA: Diagnosis not present

## 2023-07-30 ENCOUNTER — Other Ambulatory Visit: Payer: Self-pay | Admitting: Cardiovascular Disease

## 2023-07-30 DIAGNOSIS — I4819 Other persistent atrial fibrillation: Secondary | ICD-10-CM

## 2023-07-30 NOTE — Telephone Encounter (Signed)
 Prescription refill request for Eliquis  received. Indication: AF Last office visit: 02/12/23  Rodgers Clack NP Scr:1.2 on 06/10/23  Epic Age: 78 Weight: 78.5kg  Based on above findings Eliquis  5mg  twice daily is the appropriate dose.  Looks like pt is being seen by [facility] now.  Refill approved x 1 with future refill request to [facility].

## 2023-07-30 NOTE — Telephone Encounter (Signed)
 Refill request

## 2023-07-31 DIAGNOSIS — M25511 Pain in right shoulder: Secondary | ICD-10-CM | POA: Diagnosis not present

## 2023-08-05 DIAGNOSIS — E119 Type 2 diabetes mellitus without complications: Secondary | ICD-10-CM | POA: Diagnosis not present

## 2023-08-05 DIAGNOSIS — R112 Nausea with vomiting, unspecified: Secondary | ICD-10-CM | POA: Diagnosis not present

## 2023-08-05 DIAGNOSIS — R1013 Epigastric pain: Secondary | ICD-10-CM | POA: Diagnosis not present

## 2023-08-05 DIAGNOSIS — K30 Functional dyspepsia: Secondary | ICD-10-CM | POA: Diagnosis not present

## 2023-08-05 DIAGNOSIS — R6881 Early satiety: Secondary | ICD-10-CM | POA: Diagnosis not present

## 2023-08-05 DIAGNOSIS — Z7901 Long term (current) use of anticoagulants: Secondary | ICD-10-CM | POA: Diagnosis not present

## 2023-08-05 DIAGNOSIS — D649 Anemia, unspecified: Secondary | ICD-10-CM | POA: Diagnosis not present

## 2023-08-17 DIAGNOSIS — Z8679 Personal history of other diseases of the circulatory system: Secondary | ICD-10-CM | POA: Diagnosis not present

## 2023-08-17 DIAGNOSIS — I341 Nonrheumatic mitral (valve) prolapse: Secondary | ICD-10-CM | POA: Diagnosis not present

## 2023-08-17 DIAGNOSIS — I10 Essential (primary) hypertension: Secondary | ICD-10-CM | POA: Diagnosis not present

## 2023-08-18 DIAGNOSIS — Z5189 Encounter for other specified aftercare: Secondary | ICD-10-CM | POA: Diagnosis not present

## 2023-08-18 DIAGNOSIS — Z96611 Presence of right artificial shoulder joint: Secondary | ICD-10-CM | POA: Diagnosis not present

## 2023-08-28 ENCOUNTER — Other Ambulatory Visit: Payer: Self-pay | Admitting: Internal Medicine

## 2023-08-28 DIAGNOSIS — R1013 Epigastric pain: Secondary | ICD-10-CM | POA: Diagnosis not present

## 2023-08-28 DIAGNOSIS — E119 Type 2 diabetes mellitus without complications: Secondary | ICD-10-CM | POA: Diagnosis not present

## 2023-08-28 DIAGNOSIS — R112 Nausea with vomiting, unspecified: Secondary | ICD-10-CM | POA: Diagnosis not present

## 2023-08-28 DIAGNOSIS — K30 Functional dyspepsia: Secondary | ICD-10-CM | POA: Diagnosis not present

## 2023-08-28 DIAGNOSIS — I4819 Other persistent atrial fibrillation: Secondary | ICD-10-CM

## 2023-08-28 DIAGNOSIS — Z7901 Long term (current) use of anticoagulants: Secondary | ICD-10-CM | POA: Diagnosis not present

## 2023-08-28 DIAGNOSIS — R6881 Early satiety: Secondary | ICD-10-CM | POA: Diagnosis not present

## 2023-08-28 NOTE — Telephone Encounter (Signed)
 Prescription refill request for Eliquis  received. Indication: Afib  Last office visit: 02/12/23 [place])  Scr: 1.2 (06/10/23)  Age: 78 Weight: 77.1kg  Appropriate dose. Refill sent.

## 2023-09-16 DIAGNOSIS — K3184 Gastroparesis: Secondary | ICD-10-CM | POA: Diagnosis not present

## 2023-09-16 DIAGNOSIS — D649 Anemia, unspecified: Secondary | ICD-10-CM | POA: Diagnosis not present

## 2023-09-16 DIAGNOSIS — E1143 Type 2 diabetes mellitus with diabetic autonomic (poly)neuropathy: Secondary | ICD-10-CM | POA: Diagnosis not present

## 2023-09-16 DIAGNOSIS — E1165 Type 2 diabetes mellitus with hyperglycemia: Secondary | ICD-10-CM | POA: Diagnosis not present

## 2023-09-16 DIAGNOSIS — R5383 Other fatigue: Secondary | ICD-10-CM | POA: Diagnosis not present

## 2023-09-16 LAB — HEMOGLOBIN A1C: Hemoglobin A1C: 7.1

## 2023-09-18 ENCOUNTER — Ambulatory Visit: Admitting: Internal Medicine
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
## 2023-09-19 DIAGNOSIS — K30 Functional dyspepsia: Secondary | ICD-10-CM | POA: Diagnosis not present

## 2023-09-19 DIAGNOSIS — E119 Type 2 diabetes mellitus without complications: Secondary | ICD-10-CM | POA: Diagnosis not present

## 2023-10-08 ENCOUNTER — Telehealth: Payer: Self-pay | Admitting: *Deleted

## 2023-10-08 ENCOUNTER — Encounter: Payer: Self-pay | Admitting: Family Medicine

## 2023-10-08 NOTE — Telephone Encounter (Signed)
 Please review recent labs.  I don't think patient needs any additional CPE labs.

## 2023-10-08 NOTE — Telephone Encounter (Signed)
 Spoke with Mrs. Ryther and cancelled lab appointment prior to her CPE.

## 2023-10-08 NOTE — Telephone Encounter (Signed)
-----   Message from Veva JINNY Ferrari sent at 10/08/2023 12:32 PM EDT ----- Regarding: Lab orders for [facility], 8.20.25 Patient is scheduled for CPX labs, please order future labs, Thanks , Veva

## 2023-10-14 DIAGNOSIS — N3946 Mixed incontinence: Secondary | ICD-10-CM | POA: Diagnosis not present

## 2023-10-14 DIAGNOSIS — N3020 Other chronic cystitis without hematuria: Secondary | ICD-10-CM | POA: Diagnosis not present

## 2023-10-23 ENCOUNTER — Other Ambulatory Visit: Payer: Medicare HMO

## 2023-10-30 ENCOUNTER — Encounter: Payer: Medicare HMO | Admitting: Family Medicine

## 2023-11-04 ENCOUNTER — Ambulatory Visit: Admitting: Family Medicine

## 2023-11-04 ENCOUNTER — Encounter: Payer: Self-pay | Admitting: Family Medicine

## 2023-11-04 VITALS — BP 104/60 | HR 71 | Temp 98.5°F | Ht 64.0 in | Wt 167.2 lb

## 2023-11-04 DIAGNOSIS — E785 Hyperlipidemia, unspecified: Secondary | ICD-10-CM

## 2023-11-04 DIAGNOSIS — Z23 Encounter for immunization: Secondary | ICD-10-CM | POA: Diagnosis not present

## 2023-11-04 DIAGNOSIS — E1159 Type 2 diabetes mellitus with other circulatory complications: Secondary | ICD-10-CM

## 2023-11-04 DIAGNOSIS — Z7984 Long term (current) use of oral hypoglycemic drugs: Secondary | ICD-10-CM

## 2023-11-04 DIAGNOSIS — E1169 Type 2 diabetes mellitus with other specified complication: Secondary | ICD-10-CM | POA: Diagnosis not present

## 2023-11-04 DIAGNOSIS — F330 Major depressive disorder, recurrent, mild: Secondary | ICD-10-CM | POA: Diagnosis not present

## 2023-11-04 DIAGNOSIS — Z0000 Encounter for general adult medical examination without abnormal findings: Secondary | ICD-10-CM | POA: Diagnosis not present

## 2023-11-04 DIAGNOSIS — I4819 Other persistent atrial fibrillation: Secondary | ICD-10-CM

## 2023-11-04 DIAGNOSIS — E119 Type 2 diabetes mellitus without complications: Secondary | ICD-10-CM

## 2023-11-04 DIAGNOSIS — I152 Hypertension secondary to endocrine disorders: Secondary | ICD-10-CM

## 2023-11-04 DIAGNOSIS — Z7985 Long-term (current) use of injectable non-insulin antidiabetic drugs: Secondary | ICD-10-CM

## 2023-11-04 LAB — HM DIABETES FOOT EXAM

## 2023-11-04 NOTE — Progress Notes (Signed)
 Patient ID: [name], female    DOB: September 12, 1945, 78 y.o.   MRN: [id number]  This visit was conducted in person.  BP 104/60   Pulse 71   Temp 98.5 F (36.9 C) (Temporal)   Ht 5' 4 (1.626 m)   Wt 167 lb 4 oz (75.9 kg)   LMP  (LMP Unknown)   SpO2 97%   BMI 28.71 kg/m    CC:  Chief Complaint  Patient presents with   Annual Exam    ([facility] scheduled for 11/06/23)    Subjective:   HPI: [name] is a 78 y.o. female presenting on 11/04/2023 for Annual Exam ((M[facility] scheduled for 11/06/23))  The patient presents for  complete physical and review of chronic health problems. He/She also has the following acute concerns today:     The patient will see  a LPN or RN for medicare wellness visit. 11/06/2023  Prevention and wellness was reviewed in detail.   MDD/GAD:  Flowsheet Row Office Visit from 11/04/2023 in [facility]  PHQ-2 Total Score 0   Ovarain cyst.. repeat final imaging per pt planned in 02/2024   Hypertension:    Well controlled. On no medications. BP Readings from Last 3 Encounters:  11/04/23 104/60  05/22/23 (!) 151/91  05/21/23 (!) 165/73  Using medication without problems or lightheadedness:  none Chest pain with exertion: none Edema:none Short of breath: none Average home BPs: Other issues:  Diabetes:  Followed by ENDO  Improved control on  metformin  XR 500 mg 2 tabs BID.  Ozempic 0.25 mg weekly.  Lab Results  Component Value Date   HGBA1C 7.1 09/16/2023  Using medications without difficulties: Hypoglycemic episodes:none Hyperglycemic episodes: none Feet problems:none Blood Sugars averaging: FBS 128-130 eye exam within last year: due Wt Readings from Last 3 Encounters:  11/04/23 167 lb 4 oz (75.9 kg)  05/22/23 170 lb (77.1 kg)  05/21/23 170 lb (77.1 kg)     Elevated Cholesterol Due for  re-eval on rosuvastatin 5 mg daily, decreasing choolesterol.  Total 145, LDL 76, HDL 52 Using medications without  problems: none Muscle aches:  none Diet compliance:  good Exercise:  minimal Other complaints:   Atrial fibrillation, followed by cardiology  Vitamin D  deficiency normal range  Iron deficiency anemia, most recent hemoglobin at 10.6.SABRASABRA Stored iron ferritin low at 8.  No blood in stool or urine, no nosebleeds, no surgery.  Relevant past medical, surgical, family and social history reviewed and updated as indicated. Interim medical history since our last visit reviewed. Allergies and medications reviewed and updated. Outpatient Medications Prior to Visit  Medication Sig Dispense Refill   apixaban  (ELIQUIS ) 5 MG TABS tablet TAKE ONE (1) TABLET BY MOUTH TWO TIMES PER DAY 60 tablet 5   calcium  carbonate (OS-CAL - DOSED IN MG OF ELEMENTAL CALCIUM ) 1250 (500 Ca) MG tablet Take 1 tablet by mouth 2 (two) times a week.     cyclobenzaprine  (FLEXERIL ) 10 MG tablet Take 1 tablet (10 mg total) by mouth 3 (three) times daily as needed for muscle spasms. 30 tablet 1   dronedarone (MULTAQ) 400 MG tablet Take 400 mg by mouth 2 (two) times daily with a meal.     estradiol (ESTRACE) 0.1 MG/GM vaginal cream Place 2 g vaginally 3 (three) times a week.     ferrous sulfate  325 (65 FE) MG tablet Take 1 tablet (325 mg total) by mouth daily with breakfast.     glucose  blood (ONETOUCH ULTRA) test strip CHECK BLOOD SUGAR 2 TIMES A DAY AS DIRECTED DX CODE E11.9 100 strip 3   hydroxypropyl methylcellulose / hypromellose (ISOPTO TEARS / GONIOVISC) 2.5 % ophthalmic solution Place 1 drop into both eyes 2 (two) times daily as needed for dry eyes.     hyoscyamine  (LEVBID ) 0.375 MG 12 hr tablet Take 1 tablet (0.375 mg total) by mouth 2 (two) times daily. 60 tablet 1   magnesium  oxide (MAG-OX) 400 (240 Mg) MG tablet Take 400 mg by mouth daily.     metFORMIN  (GLUCOPHAGE -XR) 500 MG 24 hr tablet TAKE 2 TABLETS BY MOUTH TWICE DAILY WITHMEALS 360 tablet 0   MYRBETRIQ  50 MG TB24 tablet Take 50 mg by mouth daily.     OZEMPIC, 0.25  OR 0.5 MG/DOSE, 2 MG/3ML SOPN Inject 0.25 mg into the skin once a week.     pantoprazole  (PROTONIX ) 40 MG tablet Take 40 mg by mouth 2 (two) times daily.     rosuvastatin (CRESTOR) 5 MG tablet Take 5 mg by mouth daily.     sertraline  (ZOLOFT ) 100 MG tablet TAKE 1 TABLET BY MOUTH NIGHTLY 90 tablet 1   trimethoprim  (TRIMPEX ) 100 MG tablet Take 100 mg by mouth daily.     methenamine (HIPREX) 1 g tablet Take 1 g by mouth 2 (two) times daily.     Insulin  Glargine (BASAGLAR  KWIKPEN) 100 UNIT/ML Inject 8-12 Units into the skin at bedtime. (Patient not taking: Reported on 05/21/2023) 15 mL 1   Insulin  Pen Needle 32G X 4 MM MISC Use 1x a day 100 each 3   magnesium  oxide (MAG-OX) 400 MG tablet Take 1 tablet (400 mg total) by mouth 2 (two) times daily. (Patient taking differently: Take 400 mg by mouth daily.) 60 tablet 6   ondansetron  (ZOFRAN ) 4 MG tablet Take 1 tablet (4 mg total) by mouth every 8 (eight) hours as needed for nausea or vomiting. 10 tablet 0   oxyCODONE -acetaminophen  (PERCOCET) 5-325 MG tablet Take 1 tablet by mouth every 4 (four) hours as needed (max 6 q). 20 tablet 0   No facility-administered medications prior to visit.     Per HPI unless specifically indicated in ROS section below Review of Systems  Constitutional:  Negative for fatigue and fever.  HENT:  Negative for congestion.   Eyes:  Negative for pain.  Respiratory:  Negative for cough and shortness of breath.   Cardiovascular:  Negative for chest pain, palpitations and leg swelling.  Gastrointestinal:  Negative for abdominal pain.  Genitourinary:  Negative for dysuria and vaginal bleeding.  Musculoskeletal:  Negative for back pain.  Neurological:  Negative for syncope, light-headedness and headaches.  Psychiatric/Behavioral:  Negative for dysphoric mood.    Objective:  BP 104/60   Pulse 71   Temp 98.5 F (36.9 C) (Temporal)   Ht 5' 4 (1.626 m)   Wt 167 lb 4 oz (75.9 kg)   LMP  (LMP Unknown)   SpO2 97%   BMI 28.71  kg/m   Wt Readings from Last 3 Encounters:  11/04/23 167 lb 4 oz (75.9 kg)  05/22/23 170 lb (77.1 kg)  05/21/23 170 lb (77.1 kg)      Physical Exam Constitutional:      General: She is not in acute distress.    Appearance: Normal appearance. She is well-developed. She is not ill-appearing or toxic-appearing.  HENT:     Head: Normocephalic.     Right Ear: Hearing, tympanic membrane, ear canal and external ear  normal. Tympanic membrane is not erythematous, retracted or bulging.     Left Ear: Hearing, tympanic membrane, ear canal and external ear normal. Tympanic membrane is not erythematous, retracted or bulging.     Nose: No mucosal edema or rhinorrhea.     Right Sinus: No maxillary sinus tenderness or frontal sinus tenderness.     Left Sinus: No maxillary sinus tenderness or frontal sinus tenderness.     Mouth/Throat:     Pharynx: Uvula midline.  Eyes:     General: Lids are normal. Lids are everted, no foreign bodies appreciated.     Conjunctiva/sclera: Conjunctivae normal.     Pupils: Pupils are equal, round, and reactive to light.  Neck:     Thyroid : No thyroid  mass or thyromegaly.     Vascular: No carotid bruit.     Trachea: Trachea normal.  Cardiovascular:     Rate and Rhythm: Normal rate. Rhythm irregularly irregular.     Pulses: Normal pulses.     Heart sounds: Normal heart sounds, S1 normal and S2 normal. No murmur heard.    No friction rub. No gallop.  Pulmonary:     Effort: Pulmonary effort is normal. No tachypnea or respiratory distress.     Breath sounds: Normal breath sounds. No decreased breath sounds, wheezing, rhonchi or rales.  Abdominal:     General: Bowel sounds are normal.     Palpations: Abdomen is soft.     Tenderness: There is no abdominal tenderness.  Musculoskeletal:     Cervical back: Normal range of motion and neck supple.  Skin:    General: Skin is warm and dry.     Findings: No rash.  Neurological:     Mental Status: She is alert.   Psychiatric:        Mood and Affect: Mood is not anxious or depressed.        Speech: Speech normal.        Behavior: Behavior normal. Behavior is cooperative.        Thought Content: Thought content normal.        Judgment: Judgment normal.       Diabetic foot exam: Normal inspection No skin breakdown No calluses  Normal DP pulses Normal sensation to light touch and monofilament Nails normal  Results for orders placed or performed in visit on 11/04/23  [facility] DIABETES FOOT EXAM   Collection Time: 11/04/23 12:00 AM  Result Value Ref Range   [facility] Diabetic Foot Exam done    *Note: Due to a large number of results and/or encounters for the requested time period, some results have not been displayed. A complete set of results can be found in Results Review.     COVID 19 screen:  No recent travel or known exposure to COVID19 The patient denies respiratory symptoms of COVID 19 at this time. The importance of social distancing was discussed today.   Assessment and Plan   The patient's preventative maintenance and recommended screening tests for an annual wellness exam were reviewed in full today. Brought up to date unless services declined.  Counselled on the importance of diet, exercise, and its role in overall health and mortality. The patient's FH and SH was reviewed, including their home life, tobacco status, and drug and alcohol  status.   DEXA 2022 mild osteopenia.. repeat in 5 years  Mammogram:  nml 09/2021 Hep C: done  Colon: 2019 neg.. no further needed given age.  Partial hysterectomy.SABRA asymptomatic.  Discussed COVID19 vaccine side effects and  benefits. Strongly encouraged the patient to get the vaccine. Questions answered. Tdap uptodate, consdier shingrix, uptodate with PNA x2  UPTD microalbumin.  Problem List Items Addressed This Visit     Controlled diabetes mellitus type II without complication (HCC) (Chronic)   Chronic, well controlled She is followed by  endocrinology  metformin  XR 500 mg 2 tabs BID.  Ozempic 0.25 mg weekly      Hyperlipidemia associated with type 2 diabetes mellitus (HCC) (Chronic)   Chronic,  LDL almost at goal < 70. rosuvastatin 5 mg daily      Hypertension associated with diabetes (HCC) (Chronic)   Chronic, blood pressure well controlled on  no medication.      MDD (major depressive disorder), recurrent episode, mild (HCC) (Chronic)   Improved control on 100 mg p.o. nightly of sertraline .     No further sleep issues of Tikosyn .      Persistent atrial fibrillation (HCC) (Chronic)   Chronic, rate controlled    dronedarone 400 mg twice daily for rhythm control  On Eliquis  anticoagulation.      Other Visit Diagnoses       Routine general medical examination at a health care facility    -  Primary     Need for influenza vaccination       Relevant Orders   Flu vaccine HIGH DOSE PF(Fluzone Trivalent) (Completed)       Greig Ring, MD

## 2023-11-04 NOTE — Assessment & Plan Note (Signed)
 Chronic, blood pressure well controlled on  no medication.

## 2023-11-04 NOTE — Assessment & Plan Note (Addendum)
 Chronic,  LDL almost at goal < 70. rosuvastatin 5 mg daily

## 2023-11-04 NOTE — Assessment & Plan Note (Addendum)
 Chronic, rate controlled    dronedarone 400 mg twice daily for rhythm control  On Eliquis  anticoagulation.

## 2023-11-04 NOTE — Assessment & Plan Note (Addendum)
 Improved control on 100 mg p.o. nightly of sertraline .     No further sleep issues of Tikosyn .

## 2023-11-04 NOTE — Assessment & Plan Note (Addendum)
 Chronic, well controlled She is followed by endocrinology  metformin  XR 500 mg 2 tabs BID.  Ozempic 0.25 mg weekly

## 2023-11-06 ENCOUNTER — Ambulatory Visit: Payer: Medicare HMO

## 2023-11-06 VITALS — Ht 65.0 in | Wt 167.0 lb

## 2023-11-06 DIAGNOSIS — Z1231 Encounter for screening mammogram for malignant neoplasm of breast: Secondary | ICD-10-CM

## 2023-11-06 DIAGNOSIS — Z0000 Encounter for general adult medical examination without abnormal findings: Secondary | ICD-10-CM | POA: Diagnosis not present

## 2023-11-06 NOTE — Progress Notes (Signed)
 Subjective:   [name] is a 78 y.o. who presents for a Medicare Wellness preventive visit.  As a reminder, Annual Wellness Visits don't include a physical exam, and some assessments may be limited, especially if this visit is performed virtually. We may recommend an in-person follow-up visit with your provider if needed.  Visit Complete: Virtual I connected with  Terica K Tarango on 11/06/23 by a audio enabled telemedicine application and verified that I am speaking with the correct person using two identifiers.  Patient Location: Home  Provider Location: Home Office  I discussed the limitations of evaluation and management by telemedicine. The patient expressed understanding and agreed to proceed.  Vital Signs: Because this visit was a virtual/telehealth visit, some criteria may be missing or patient reported. Any vitals not documented were not able to be obtained and vitals that have been documented are patient reported.  VideoDeclined- This patient declined Librarian, academic. Therefore the visit was completed with audio only.  Persons Participating in Visit: Patient.  AWV Questionnaire: No: Patient Medicare AWV questionnaire was not completed prior to this visit.  Cardiac Risk Factors include: advanced age (>66men, >47 women);diabetes mellitus;dyslipidemia;hypertension;sedentary lifestyle     Objective:    Today's Vitals   11/06/23 0857  Weight: 167 lb (75.8 kg)  Height: 5' 5 (1.651 m)   Body mass index is 27.79 kg/m.     11/06/2023    9:07 AM 05/22/2023    7:43 AM 05/21/2023   10:44 AM 05/15/2023   11:59 AM 10/29/2022    1:15 PM 10/05/2022    9:19 PM 04/23/2022    8:35 AM  Advanced Directives  Does Patient Have a Medical Advance Directive? Yes Yes Yes No Yes No Yes  Type of Estate agent of [place];Living will Healthcare Power of [place];Living will Healthcare Power of [place];Living will  Healthcare Power of  [place];Living will  Healthcare Power of [place];Living will  Does patient want to make changes to medical advance directive?  No - Patient declined       Copy of Healthcare Power of Attorney in Chart? No - copy requested No - copy requested   No - copy requested  No - copy requested    Current Medications (verified) Outpatient Encounter Medications as of 11/06/2023  Medication Sig   apixaban  (ELIQUIS ) 5 MG TABS tablet TAKE ONE (1) TABLET BY MOUTH TWO TIMES PER DAY   calcium  carbonate (OS-CAL - DOSED IN MG OF ELEMENTAL CALCIUM ) 1250 (500 Ca) MG tablet Take 1 tablet by mouth 2 (two) times a week.   cyclobenzaprine  (FLEXERIL ) 10 MG tablet Take 1 tablet (10 mg total) by mouth 3 (three) times daily as needed for muscle spasms.   dronedarone (MULTAQ) 400 MG tablet Take 400 mg by mouth 2 (two) times daily with a meal.   estradiol (ESTRACE) 0.1 MG/GM vaginal cream Place 2 g vaginally 3 (three) times a week.   ferrous sulfate  325 (65 FE) MG tablet Take 1 tablet (325 mg total) by mouth daily with breakfast.   glucose blood (ONETOUCH ULTRA) test strip CHECK BLOOD SUGAR 2 TIMES A DAY AS DIRECTED DX CODE E11.9   hydroxypropyl methylcellulose / hypromellose (ISOPTO TEARS / GONIOVISC) 2.5 % ophthalmic solution Place 1 drop into both eyes 2 (two) times daily as needed for dry eyes.   hyoscyamine  (LEVBID ) 0.375 MG 12 hr tablet Take 1 tablet (0.375 mg total) by mouth 2 (two) times daily.   magnesium  oxide (MAG-OX) 400 (240 Mg)  MG tablet Take 400 mg by mouth daily.   metFORMIN  (GLUCOPHAGE -XR) 500 MG 24 hr tablet TAKE 2 TABLETS BY MOUTH TWICE DAILY WITHMEALS   methenamine (HIPREX) 1 g tablet Take 1 g by mouth 2 (two) times daily.   MYRBETRIQ  50 MG TB24 tablet Take 50 mg by mouth daily.   OZEMPIC, 0.25 OR 0.5 MG/DOSE, 2 MG/3ML SOPN Inject 0.25 mg into the skin once a week.   pantoprazole  (PROTONIX ) 40 MG tablet Take 40 mg by mouth 2 (two) times daily.   rosuvastatin (CRESTOR) 5 MG tablet Take 5 mg by mouth daily.    sertraline  (ZOLOFT ) 100 MG tablet TAKE 1 TABLET BY MOUTH NIGHTLY   No facility-administered encounter medications on file as of 11/06/2023.    Allergies (verified) Pravachol [pravastatin sodium] and Cortisone   History: Past Medical History:  Diagnosis Date   A-fib (HCC)    Anemia    Anxiety    Arthritis    Asthma    Chest pain    a. 09/2013 MV: Low risk; b. 12/2016 Coronary CTA/morphology: Ca2+ = 0; c. 11/2021 MV: EF 75%, no isch/infarct. Ao atherosclerosis noted. No signif Cor Ca2+.   Colon polyps    Constipation    Depressive disorder, not elsewhere classified    Diabetes mellitus without complication (HCC)    Diaphragmatic hernia without mention of obstruction or gangrene    Diastolic dysfunction    a. 01/2022 Echo: EF 55-60%, no rwma, GrII DD, nl RV size/fxn, RVSP 33.48mmHg. Sev dil LA, mild MR.   Disorders of bursae and tendons in shoulder region, unspecified    Diverticulosis of colon (without mention of hemorrhage)    Dysrhythmia    External hemorrhoid    Fundic gland polyps of stomach, benign    GERD (gastroesophageal reflux disease)    History of hiatal hernia    HTN (hypertension)    Hypertension    Hypothyroidism    Internal hemorrhoids without mention of complication    Mitral regurgitation    a. 01/2022 Echo: Mild MR.   Obesity, unspecified    OSA on CPAP    compliant with CPAP   Other urinary incontinence    Persistent atrial fibrillation (HCC)    a. Dx 09/2013 s/p multiple DCCVS; b. 12/2016 s/p PVI; c. 2023 Flecainide  changed to Tikosyn ; d. 04/2022 s/p redo PVI/fib/flutter ablation.   Pneumonia    Pure hypercholesterolemia    Tubular adenoma of colon    Type II diabetes mellitus (HCC)    Urinary frequency    Urinary urgency    Vitamin B 12 deficiency    Past Surgical History:  Procedure Laterality Date   ABDOMINAL HYSTERECTOMY  1972   Have both ovaries   ATRIAL FIBRILLATION ABLATION  12/17/2016   ATRIAL FIBRILLATION ABLATION N/A 12/17/2016    Procedure: ATRIAL FIBRILLATION ABLATION;  Surgeon: Kelsie Agent, MD;  [facility]: [facility];  Service: Cardiovascular;  Laterality: N/A;   ATRIAL FIBRILLATION ABLATION N/A 04/23/2022   Procedure: ATRIAL FIBRILLATION ABLATION;  Surgeon: Cindie Ole DASEN, MD;  Location: [facility];  Service: Cardiovascular;  Laterality: N/A;   CARDIAC CATHETERIZATION  1990s   [facility]; Dr. Morgan   CARDIOVERSION N/A 05/27/2016   Procedure: CARDIOVERSION;  Surgeon: Deatrice DELENA Cage, MD;  Location: [facility];  Service: Cardiovascular;  Laterality: N/A;   CARDIOVERSION N/A 11/25/2016   Procedure: CARDIOVERSION;  Surgeon: Cage Deatrice DELENA, MD;  Location: [facility];  Service: Cardiovascular;  Laterality: N/A;   CARDIOVERSION N/A 09/18/2018  Procedure: CARDIOVERSION;  Surgeon: Perla Evalene PARAS, MD;  Location: [facility];  Service: Cardiovascular;  Laterality: N/A;   CARDIOVERSION N/A 07/28/2019   Procedure: CARDIOVERSION;  Surgeon: Darliss Rogue, MD;  Location: [facility];  Service: Cardiovascular;  Laterality: N/A;   CARDIOVERSION N/A 09/07/2019   Procedure: CARDIOVERSION;  Surgeon: Mady Bruckner, MD;  Location: [facility];  Service: Cardiovascular;  Laterality: N/A;   CARDIOVERSION N/A 02/19/2022   Procedure: CARDIOVERSION;  Surgeon: Mady Bruckner, MD;  Location: [facility];  Service: Cardiovascular;  Laterality: N/A;   CATARACT EXTRACTION W/ INTRAOCULAR LENS  IMPLANT, BILATERAL Bilateral    COLONOSCOPY     ELECTROPHYSIOLOGIC STUDY N/A 01/24/2016   Procedure: CARDIOVERSION;  Surgeon: Evalene PARAS Perla, MD;  Location: [facility];  Service: Cardiovascular;  Laterality: N/A;   implantable loop recorder implantation  10/18/2019   Medtronic Reveal Linq model [id number] ([id number]) implantable loop recorder    LAPAROSCOPIC CHOLECYSTECTOMY     never stimulator     for her bladder   REDUCTION MAMMAPLASTY Bilateral    REVERSE SHOULDER ARTHROPLASTY Right 05/22/2023   Procedure: ARTHROPLASTY, SHOULDER, TOTAL,  REVERSE;  Surgeon: Melita Drivers, MD;  Location: [facility];  Service: Orthopedics;  Laterality: Right;   SHOULDER SURGERY Left 10/03/2022   TONSILLECTOMY     TOTAL HIP ARTHROPLASTY Right 05/12/2018   Procedure: TOTAL HIP ARTHROPLASTY ANTERIOR APPROACH;  Surgeon: Ernie Cough, MD;  Location: [facility];  Service: Orthopedics;  Laterality: Right;  70 mins   Family History  Problem Relation Age of Onset   Cancer Mother        fallopian tube   Dementia Mother    Pulmonary embolism Mother    Hyperlipidemia Mother    Diabetes Mother    Hyperlipidemia Father    Lung cancer Father    Hypertension Sister    Diabetes Brother    Cancer Brother    Stroke Paternal Grandmother    Stroke Paternal Grandfather    Kidney cancer Son    Dementia Maternal Aunt    Colon cancer Neg Hx    Esophageal cancer Neg Hx    Social History   Socioeconomic History   Marital status: Married    Spouse name: Rutha   Number of children: 2   Years of education: Not on file   Highest education level: Not on file  Occupational History   Occupation: retired  Tobacco Use   Smoking status: Never   Smokeless tobacco: Never   Tobacco comments:    Never smoke 08/30/21  Vaping Use   Vaping status: Never Used  Substance and Sexual Activity   Alcohol  use: Never   Drug use: No   Sexual activity: Yes  Other Topics Concern   Not on file  Social History Narrative   ** Merged History Encounter **       Exercise walks 3 times daily Lives with spouse in [place] - retired from employment 1 son and 1 daughter  Diet fruit, occ fast food, salads and lean meat  3 tea/day, no EtOH, no tobacco, no drugs    Social Drivers of Corporate investment banker Strain: Low Risk  (11/06/2023)   Overall Financial Resource Strain (CARDIA)    Difficulty of Paying Living Expenses: Not hard at all  Food Insecurity: No Food Insecurity (11/06/2023)   Hunger Vital Sign    Worried About Running Out of Food in the Last Year:  Never true    Ran Out of Food in the Last Year: Never true  Transportation Needs: No Transportation Needs (11/06/2023)   PRAPARE - Administrator, Civil Service (Medical): No    Lack of Transportation (Non-Medical): No  Physical Activity: Inactive (11/06/2023)   Exercise Vital Sign    Days of Exercise per Week: 0 days    Minutes of Exercise per Session: 0 min  Stress: No Stress Concern Present (11/06/2023)   [employer] of Occupational Health - Occupational Stress Questionnaire    Feeling of Stress: Not at all  Social Connections: Moderately Integrated (11/06/2023)   Social Connection and Isolation Panel    Frequency of Communication with Friends and Family: More than three times a week    Frequency of Social Gatherings with Friends and Family: More than three times a week    Attends Religious Services: More than 4 times per year    Active Member of [employer] or Organizations: No    Attends Banker Meetings: Never    Marital Status: Married    Tobacco Counseling Counseling given: Not Answered Tobacco comments: Never smoke 08/30/21   Clinical Intake:  Pre-visit preparation completed: Yes  Pain : No/denies pain    BMI - recorded: 27.79 Nutritional Status: BMI 25 -29 Overweight Nutritional Risks: None Diabetes: Yes CBG done?: No Did pt. bring in CBG monitor from home?: No  Lab Results  Component Value Date   HGBA1C 7.1 09/16/2023   HGBA1C 7.4 03/13/2023   HGBA1C 7.0 (A) 09/30/2022     How often do you need to have someone help you when you read instructions, pamphlets, or other written materials from your doctor or pharmacy?: 1 - Never  Interpreter Needed?: No  Comments: lives with husband Information entered by :: B.Kendrick Remigio,LPN   Activities of Daily Living     11/06/2023    9:07 AM 05/21/2023   10:46 AM  In your present state of health, do you have any difficulty performing the following activities:  Hearing? 0   Vision? 0   Difficulty  concentrating or making decisions? 0   Walking or climbing stairs? 0   Dressing or bathing? 0   Doing errands, shopping? 0 0  Preparing Food and eating ? N   Using the Toilet? N   In the past six months, have you accidently leaked urine? Y   Do you have problems with loss of bowel control? N   Managing your Medications? N   Managing your Finances? N   Housekeeping or managing your Housekeeping? N     Patient Care Team: Avelina Greig BRAVO, MD as PCP - General (Family Medicine) Darron Deatrice LABOR, MD as PCP - Cardiology (Cardiology) Fernande Elspeth BROCKS, MD as PCP - Electrophysiology (Cardiology) Trixie File, MD as Consulting Physician (Internal Medicine) Darron Deatrice LABOR, MD as Consulting Physician (Cardiology) Linard Alm NOVAK, MD (Inactive) as Consulting Physician (Pulmonary Disease) Avelina Greig BRAVO, MD Pa, [facility]  I have updated your Care Teams any recent Medical Services you may have received from other providers in the past year.     Assessment:   This is a routine wellness examination for Telicia.  Hearing/Vision screen Hearing Screening - Comments:: Patient denies any hearing difficulties.   Vision Screening - Comments:: Pt says their vision is good without glasses/readers Dr  Kandi w/visits   Goals Addressed             This Visit's Progress    COMPLETED: Patient Stated       08/31/2021 - I will continue to walk 3  days a week and try to increase to 30-45 minutes.  Try to stay out of A.Fib     COMPLETED: Patient Stated       Be well enough to be able to go and do things on my own again.     Patient Stated       I would like to get back into my routine and organize closet/home     Patient Stated:  Managing Health conditions   On track    Interventions Today    Flowsheet Row Most Recent Value  Chronic Disease   Chronic disease during today's visit Diabetes, Atrial Fibrillation (AFib), Other  [ovarian cyst, bilateral shoulder pain, UTI]   General Interventions   General Interventions Discussed/Reviewed General Interventions Reviewed, Labs, Doctor Visits  [evaluation of current treatment plan for DM, A-FIB, ovarian cyst, bilateral shoulder pain, UTI and patients adherence to plan as established by provider.  Assessed BS, a-fib symptoms, follow up for ovarian cyst, shoulder surgery, UTI symptoms.]  Labs Hgb A1c every 6 months  [discussed recent Hgb A1c and endocrinologist plan of care.]  Doctor Visits Discussed/Reviewed Doctor Visits Reviewed  [facility] [place] upcoming provider visits. Discussed GYN visit regarding ovarian cyst.]  Education Interventions   Education Provided Provided Education  [UTI education provided:  Drink plenty of water , don't hold urine for long periods, keep vaginal area clean, wipe front to back.]  Pharmacy Interventions   Pharmacy Dicussed/Reviewed Pharmacy Topics Reviewed  [medications reviewed and compliance discussed. Discussed new addition of baslagar to diabetes treatment plan. Assessed if patient completed antibiotic course for UTI.]                 Depression Screen     11/06/2023    9:04 AM 11/04/2023   11:53 AM 05/21/2023    8:15 AM 04/29/2023   11:23 AM 04/29/2023   11:21 AM 11/29/2022    9:13 AM 10/29/2022    1:08 PM  PHQ 2/9 Scores  PHQ - 2 Score 0 0 1 0 0 2 0  PHQ- 9 Score   3 2  8      Fall Risk     11/06/2023    9:00 AM 11/04/2023   11:53 AM 05/21/2023    8:15 AM 11/29/2022    9:13 AM 10/29/2022    1:16 PM  Fall Risk   Falls in the past year? 0 0 0 0 0  Number falls in past yr: 0 0 0 0 0  Injury with Fall? 0 0 0 0 0  Risk for fall due to : No Fall Risks No Fall Risks No Fall Risks No Fall Risks No Fall Risks  Follow up Education provided;Falls prevention discussed Falls evaluation completed Falls evaluation completed Falls evaluation completed Falls prevention discussed;Falls evaluation completed    MEDICARE RISK AT HOME:  Medicare Risk at Home Any stairs in or around the home?:  Yes If so, are there any without handrails?: Yes Home free of loose throw rugs in walkways, pet beds, electrical cords, etc?: Yes Adequate lighting in your home to reduce risk of falls?: Yes Life alert?: No Use of a cane, walker or w/c?: No Grab bars in the bathroom?: Yes Shower chair or bench in shower?: Yes Elevated toilet seat or a handicapped toilet?: Yes  TIMED UP AND GO:  Was the test performed?  No  Cognitive Function: 6CIT completed    08/29/2020   11:41 AM 08/27/2019   11:26 AM 08/17/2018   11:01 AM 05/26/2017   10:29  AM 05/10/2016    1:40 PM  MMSE - Mini Mental State Exam  Orientation to time 5 5 5 5 5    Orientation to Place 5 5 5 5 5    Registration 3 3 3 3 3    Attention/ Calculation 5 5 0 0 0   Recall 3 3 3 3 2    Recall-comments     pt was unable to recall 1 of 3 words   Language- name 2 objects   0 0 0   Language- repeat 1 1 1 1 1   Language- follow 3 step command   0 3 3   Language- read & follow direction   0 0 0   Write a sentence   0 0 0   Copy design   0 0 0   Total score   17 20 19       Data saved with a previous flowsheet row definition        11/06/2023    9:09 AM 10/29/2022    1:17 PM 08/31/2021    2:04 PM  6CIT Screen  What Year? 0 points 0 points 0 points  What month? 0 points 0 points 0 points  What time? 0 points 0 points 0 points  Count back from 20 0 points 0 points 0 points  Months in reverse 0 points 0 points 0 points  Repeat phrase 0 points 2 points 0 points  Total Score 0 points 2 points 0 points    Immunizations Immunization History  Administered Date(s) Administered   Fluad Quad(high Dose 65+) 11/17/2018   INFLUENZA, HIGH DOSE SEASONAL PF 12/19/2016, 12/01/2017, 12/09/2022, 11/04/2023   Influenza Split 01/03/2011, 01/17/2012   Influenza Whole 11/25/2007, 01/18/2009, 11/28/2009   Influenza,inj,Quad PF,6+ Mos 01/06/2013, 12/07/2013, 12/14/2014, 11/24/2015, 02/10/2020   Pneumococcal Conjugate-13 05/11/2013   Pneumococcal  Polysaccharide-23 01/17/2012, 05/26/2017   Td 04/10/2010, 10/20/2020   Zoster, Live 05/13/2014    Screening Tests Health Maintenance  Topic Date Due   COVID-19 Vaccine (1) Never done   Zoster Vaccines- Shingrix (1 of 2) 03/20/1966   MAMMOGRAM  11/25/2023   Diabetic kidney evaluation - Urine ACR  12/09/2023   HEMOGLOBIN A1C  03/18/2024   OPHTHALMOLOGY EXAM  04/01/2024   Diabetic kidney evaluation - eGFR measurement  05/20/2024   FOOT EXAM  11/03/2024   Medicare Annual Wellness (AWV)  11/05/2024   Colonoscopy  09/22/2025   DEXA SCAN  06/15/2028   DTaP/Tdap/Td (3 - Tdap) 10/21/2030   Pneumococcal Vaccine: 50+ Years  Completed   INFLUENZA VACCINE  Completed   Hepatitis C Screening  Completed   HPV VACCINES  Aged Out   Meningococcal B Vaccine  Aged Out    Health Maintenance  Health Maintenance Due  Topic Date Due   COVID-19 Vaccine (1) Never done   Zoster Vaccines- Shingrix (1 of 2) 03[birth year]20/1964   Health Maintenance Items Addressed: None due at this time. Pt will receive vaccines at their pharmcy when decided to obtain  MMG ordered  Additional Screening:  Vision Screening: Recommended annual ophthalmology exams for early detection of glaucoma and other disorders of the eye. Would you like a referral to an eye doctor? No    Dental Screening: Recommended annual dental exams for proper oral hygiene  Community Resource Referral / Chronic Care Management: CRR required this visit?  No   CCM required this visit?  No   Plan:    I have personally reviewed and noted the following in the patient's chart:   Medical and social  history Use of alcohol , tobacco or illicit drugs  Current medications and supplements including opioid prescriptions. Patient is not currently taking opioid prescriptions. Functional ability and status Nutritional status Physical activity Advanced directives List of other physicians Hospitalizations, surgeries, and ER visits in previous 12  months Vitals Screenings to include cognitive, depression, and falls Referrals and appointments  In addition, I have reviewed and discussed with patient certain preventive protocols, quality metrics, and best practice recommendations. A written personalized care plan for preventive services as well as general preventive health recommendations were provided to patient.   Erminio LITTIE Saris, LPN   0/[id number]/7972   After Visit Summary: (MyChart) Due to this being a telephonic visit, the after visit summary with patients personalized plan was offered to patient via MyChart   Notes: Nothing significant to report at this time.

## 2023-11-06 NOTE — Patient Instructions (Addendum)
 Ms. [name] , Thank you for taking time out of your busy schedule to complete your Annual Wellness Visit with me. I enjoyed our conversation and look forward to speaking with you again next year. I, as well as your care team,  appreciate your ongoing commitment to your health goals. Please review the following plan we discussed and let me know if I can assist you in the future. Your Game plan/ To Do List    Referrals: If you haven't heard from the office you've been referred to, please reach out to them at the phone provided.   Follow up Visits: We will see or speak with you next year for your Next Medicare AWV with our clinical staff-11/10/23 @ 8:50am televisit Have you seen your provider in the last 6 months (3 months if uncontrolled diabetes)? Yes  Clinician Recommendations:  Aim for 30 minutes of exercise or brisk walking, 6-8 glasses of water , and 5 servings of fruits and vegetables each day.       This is a list of the screenings recommended for you:  Health Maintenance  Topic Date Due   COVID-19 Vaccine (1) Never done   Zoster (Shingles) Vaccine (1 of 2) 03/20/1966   Mammogram  11/25/2023   Yearly kidney health urinalysis for diabetes  12/09/2023   Hemoglobin A1C  03/18/2024   Eye exam for diabetics  04/01/2024   Yearly kidney function blood test for diabetes  05/20/2024   Complete foot exam   11/03/2024   Medicare Annual Wellness Visit  11/05/2024   Colon Cancer Screening  09/22/2025   DEXA scan (bone density measurement)  06/15/2028   DTaP/Tdap/Td vaccine (3 - Tdap) 10/21/2030   Pneumococcal Vaccine for age over 44  Completed   Flu Shot  Completed   Hepatitis C Screening  Completed   HPV Vaccine  Aged Out   Meningitis B Vaccine  Aged Out    Advanced directives: (Copy Requested) Please bring a copy of your health care power of attorney and living will to the office to be added to your chart at your convenience. You can mail to [facility] [address]  [place], [place] [address] or email to [email or web address] Advance Care Planning is important because it:  [x]  Makes sure you receive the medical care that is consistent with your values, goals, and preferences  [x]  It provides guidance to your family and loved ones and reduces their decisional burden about whether or not they are making the right decisions based on your wishes.  Follow the link provided in your after visit summary or read over the paperwork we have mailed to you to help you started getting your Advance Directives in place. If you need assistance in completing these, please reach out to us  so that we can help you!

## 2023-11-16 DIAGNOSIS — I341 Nonrheumatic mitral (valve) prolapse: Secondary | ICD-10-CM | POA: Diagnosis not present

## 2023-11-16 DIAGNOSIS — Z8679 Personal history of other diseases of the circulatory system: Secondary | ICD-10-CM | POA: Diagnosis not present

## 2023-11-16 DIAGNOSIS — I10 Essential (primary) hypertension: Secondary | ICD-10-CM | POA: Diagnosis not present

## 2023-11-20 DIAGNOSIS — Z008 Encounter for other general examination: Secondary | ICD-10-CM | POA: Diagnosis not present

## 2023-12-03 ENCOUNTER — Ambulatory Visit
Admission: RE | Admit: 2023-12-03 | Discharge: 2023-12-03 | Disposition: A | Discharge status: Home / Self Care | Source: Ambulatory Visit | Attending: Family Medicine | Admitting: Family Medicine

## 2023-12-03 DIAGNOSIS — Z1231 Encounter for screening mammogram for malignant neoplasm of breast: Secondary | ICD-10-CM | POA: Diagnosis not present

## 2023-12-05 DIAGNOSIS — Z8679 Personal history of other diseases of the circulatory system: Secondary | ICD-10-CM | POA: Diagnosis not present

## 2023-12-05 DIAGNOSIS — Z9889 Other specified postprocedural states: Secondary | ICD-10-CM | POA: Diagnosis not present

## 2023-12-05 DIAGNOSIS — Z7901 Long term (current) use of anticoagulants: Secondary | ICD-10-CM | POA: Diagnosis not present

## 2023-12-05 DIAGNOSIS — I499 Cardiac arrhythmia, unspecified: Secondary | ICD-10-CM | POA: Diagnosis not present

## 2023-12-05 DIAGNOSIS — Z95818 Presence of other cardiac implants and grafts: Secondary | ICD-10-CM | POA: Diagnosis not present

## 2023-12-05 DIAGNOSIS — Z79899 Other long term (current) drug therapy: Secondary | ICD-10-CM | POA: Diagnosis not present

## 2023-12-09 ENCOUNTER — Ambulatory Visit: Payer: Self-pay | Admitting: Family Medicine

## 2023-12-16 DIAGNOSIS — N390 Urinary tract infection, site not specified: Secondary | ICD-10-CM | POA: Diagnosis not present

## 2023-12-16 DIAGNOSIS — N3281 Overactive bladder: Secondary | ICD-10-CM | POA: Diagnosis not present

## 2023-12-16 DIAGNOSIS — R351 Nocturia: Secondary | ICD-10-CM | POA: Diagnosis not present

## 2024-01-19 DIAGNOSIS — L853 Xerosis cutis: Secondary | ICD-10-CM | POA: Diagnosis not present

## 2024-01-19 DIAGNOSIS — L821 Other seborrheic keratosis: Secondary | ICD-10-CM | POA: Diagnosis not present

## 2024-01-19 DIAGNOSIS — L578 Other skin changes due to chronic exposure to nonionizing radiation: Secondary | ICD-10-CM | POA: Diagnosis not present

## 2024-01-19 DIAGNOSIS — L814 Other melanin hyperpigmentation: Secondary | ICD-10-CM | POA: Diagnosis not present

## 2024-01-27 DIAGNOSIS — K30 Functional dyspepsia: Secondary | ICD-10-CM | POA: Diagnosis not present

## 2024-01-27 DIAGNOSIS — E119 Type 2 diabetes mellitus without complications: Secondary | ICD-10-CM | POA: Diagnosis not present

## 2024-01-27 DIAGNOSIS — R6881 Early satiety: Secondary | ICD-10-CM | POA: Diagnosis not present

## 2024-02-09 ENCOUNTER — Other Ambulatory Visit: Payer: Self-pay | Admitting: Internal Medicine

## 2024-02-15 DIAGNOSIS — I341 Nonrheumatic mitral (valve) prolapse: Secondary | ICD-10-CM | POA: Diagnosis not present

## 2024-02-15 DIAGNOSIS — I10 Essential (primary) hypertension: Secondary | ICD-10-CM | POA: Diagnosis not present

## 2024-02-15 DIAGNOSIS — Z8679 Personal history of other diseases of the circulatory system: Secondary | ICD-10-CM | POA: Diagnosis not present

## 2024-02-17 DIAGNOSIS — D649 Anemia, unspecified: Secondary | ICD-10-CM | POA: Diagnosis not present

## 2024-02-17 DIAGNOSIS — R6881 Early satiety: Secondary | ICD-10-CM | POA: Diagnosis not present

## 2024-02-17 DIAGNOSIS — Z7901 Long term (current) use of anticoagulants: Secondary | ICD-10-CM | POA: Diagnosis not present

## 2024-02-17 DIAGNOSIS — E119 Type 2 diabetes mellitus without complications: Secondary | ICD-10-CM | POA: Diagnosis not present

## 2024-02-17 DIAGNOSIS — R1013 Epigastric pain: Secondary | ICD-10-CM | POA: Diagnosis not present

## 2024-02-17 DIAGNOSIS — R112 Nausea with vomiting, unspecified: Secondary | ICD-10-CM | POA: Diagnosis not present

## 2024-02-17 DIAGNOSIS — K3184 Gastroparesis: Secondary | ICD-10-CM | POA: Diagnosis not present

## 2024-02-17 DIAGNOSIS — E1143 Type 2 diabetes mellitus with diabetic autonomic (poly)neuropathy: Secondary | ICD-10-CM | POA: Diagnosis not present

## 2024-03-15 ENCOUNTER — Other Ambulatory Visit: Payer: Self-pay | Admitting: Cardiovascular Disease

## 2024-03-15 DIAGNOSIS — I4819 Other persistent atrial fibrillation: Secondary | ICD-10-CM

## 2024-10-28 ENCOUNTER — Other Ambulatory Visit

## 2024-11-04 ENCOUNTER — Encounter: Admitting: Family Medicine

## 2024-11-09 ENCOUNTER — Ambulatory Visit
